# Patient Record
Sex: Female | Born: 1960
Health system: Southern US, Community
[De-identification: ages and names within clinical notes are randomized; demographics above are authoritative.]

## PROBLEM LIST (undated history)

## (undated) DIAGNOSIS — R600 Localized edema: Secondary | ICD-10-CM

## (undated) DIAGNOSIS — F4389 Other reactions to severe stress: Secondary | ICD-10-CM

## (undated) DIAGNOSIS — K760 Fatty (change of) liver, not elsewhere classified: Secondary | ICD-10-CM

## (undated) DIAGNOSIS — R5382 Chronic fatigue, unspecified: Secondary | ICD-10-CM

## (undated) DIAGNOSIS — A048 Other specified bacterial intestinal infections: Secondary | ICD-10-CM

## (undated) DIAGNOSIS — E0590 Thyrotoxicosis, unspecified without thyrotoxic crisis or storm: Secondary | ICD-10-CM

## (undated) DIAGNOSIS — R609 Edema, unspecified: Secondary | ICD-10-CM

## (undated) DIAGNOSIS — F438 Other reactions to severe stress: Secondary | ICD-10-CM

## (undated) DIAGNOSIS — M6740 Ganglion, unspecified site: Secondary | ICD-10-CM

## (undated) DIAGNOSIS — I10 Essential (primary) hypertension: Secondary | ICD-10-CM

## (undated) DIAGNOSIS — E1165 Type 2 diabetes mellitus with hyperglycemia: Secondary | ICD-10-CM

## (undated) DIAGNOSIS — J189 Pneumonia, unspecified organism: Secondary | ICD-10-CM

## (undated) DIAGNOSIS — M791 Myalgia, unspecified site: Secondary | ICD-10-CM

## (undated) DIAGNOSIS — M79609 Pain in unspecified limb: Secondary | ICD-10-CM

## (undated) DIAGNOSIS — I499 Cardiac arrhythmia, unspecified: Secondary | ICD-10-CM

## (undated) DIAGNOSIS — M66369 Spontaneous rupture of flexor tendons, unspecified lower leg: Secondary | ICD-10-CM

## (undated) DIAGNOSIS — E538 Deficiency of other specified B group vitamins: Secondary | ICD-10-CM

## (undated) DIAGNOSIS — K219 Gastro-esophageal reflux disease without esophagitis: Secondary | ICD-10-CM

## (undated) DIAGNOSIS — E079 Disorder of thyroid, unspecified: Secondary | ICD-10-CM

## (undated) DIAGNOSIS — R519 Headache, unspecified: Secondary | ICD-10-CM

## (undated) DIAGNOSIS — M549 Dorsalgia, unspecified: Secondary | ICD-10-CM

## (undated) DIAGNOSIS — K589 Irritable bowel syndrome without diarrhea: Secondary | ICD-10-CM

## (undated) DIAGNOSIS — A0472 Enterocolitis due to Clostridium difficile, not specified as recurrent: Secondary | ICD-10-CM

## (undated) DIAGNOSIS — R002 Palpitations: Secondary | ICD-10-CM

## (undated) DIAGNOSIS — M25519 Pain in unspecified shoulder: Secondary | ICD-10-CM

## (undated) DIAGNOSIS — Z889 Allergy status to unspecified drugs, medicaments and biological substances status: Secondary | ICD-10-CM

## (undated) DIAGNOSIS — Z9889 Other specified postprocedural states: Secondary | ICD-10-CM

## (undated) DIAGNOSIS — Z113 Encounter for screening for infections with a predominantly sexual mode of transmission: Secondary | ICD-10-CM

## (undated) DIAGNOSIS — M25529 Pain in unspecified elbow: Secondary | ICD-10-CM

## (undated) DIAGNOSIS — F419 Anxiety disorder, unspecified: Secondary | ICD-10-CM

## (undated) DIAGNOSIS — E785 Hyperlipidemia, unspecified: Secondary | ICD-10-CM

## (undated) DIAGNOSIS — T7840XA Allergy, unspecified, initial encounter: Secondary | ICD-10-CM

## (undated) DIAGNOSIS — B029 Zoster without complications: Secondary | ICD-10-CM

## (undated) DIAGNOSIS — R079 Chest pain, unspecified: Secondary | ICD-10-CM

## (undated) DIAGNOSIS — R0600 Dyspnea, unspecified: Secondary | ICD-10-CM

## (undated) DIAGNOSIS — M797 Fibromyalgia: Secondary | ICD-10-CM

## (undated) DIAGNOSIS — H269 Unspecified cataract: Secondary | ICD-10-CM

## (undated) DIAGNOSIS — R030 Elevated blood-pressure reading, without diagnosis of hypertension: Secondary | ICD-10-CM

## (undated) DIAGNOSIS — R112 Nausea with vomiting, unspecified: Secondary | ICD-10-CM

## (undated) DIAGNOSIS — K5792 Diverticulitis of intestine, part unspecified, without perforation or abscess without bleeding: Secondary | ICD-10-CM

## (undated) DIAGNOSIS — G9332 Myalgic encephalomyelitis/chronic fatigue syndrome: Secondary | ICD-10-CM

## (undated) DIAGNOSIS — Z9049 Acquired absence of other specified parts of digestive tract: Secondary | ICD-10-CM

## (undated) DIAGNOSIS — Z8719 Personal history of other diseases of the digestive system: Secondary | ICD-10-CM

## (undated) DIAGNOSIS — R51 Headache: Secondary | ICD-10-CM

## (undated) HISTORY — DX: Encounter for screening for infections with a predominantly sexual mode of transmission: Z11.3

## (undated) HISTORY — DX: Zoster without complications: B02.9

## (undated) HISTORY — DX: Disorder of thyroid, unspecified: E07.9

## (undated) HISTORY — DX: Type 2 diabetes mellitus with hyperglycemia: E11.65

## (undated) HISTORY — DX: Other reactions to severe stress: F43.89

## (undated) HISTORY — DX: Allergy, unspecified, initial encounter: T78.40XA

## (undated) HISTORY — DX: Enterocolitis due to Clostridium difficile, not specified as recurrent: A04.72

## (undated) HISTORY — DX: Chest pain, unspecified: R07.9

## (undated) HISTORY — DX: Edema, unspecified: R60.9

## (undated) HISTORY — DX: Gastro-esophageal reflux disease without esophagitis: K21.9

## (undated) HISTORY — PX: BUNIONECTOMY: SHX129

## (undated) HISTORY — DX: Pain in unspecified shoulder: M25.519

## (undated) HISTORY — DX: Personal history of other diseases of the digestive system: Z87.19

## (undated) HISTORY — DX: Other specified bacterial intestinal infections: A04.8

## (undated) HISTORY — DX: Allergy status to unspecified drugs, medicaments and biological substances status: Z88.9

## (undated) HISTORY — DX: Unspecified cataract: H26.9

## (undated) HISTORY — PX: COLONOSCOPY: SHX174

## (undated) HISTORY — DX: Localized edema: R60.0

## (undated) HISTORY — DX: Hyperlipidemia, unspecified: E78.5

## (undated) HISTORY — DX: Pain in unspecified elbow: M25.529

## (undated) HISTORY — DX: Chronic fatigue, unspecified: R53.82

## (undated) HISTORY — DX: Dorsalgia, unspecified: M54.9

## (undated) HISTORY — DX: Elevated blood-pressure reading, without diagnosis of hypertension: R03.0

## (undated) HISTORY — DX: Spontaneous rupture of flexor tendons, unspecified lower leg: M66.369

## (undated) HISTORY — PX: POLYPECTOMY: SHX149

## (undated) HISTORY — DX: Diverticulitis of intestine, part unspecified, without perforation or abscess without bleeding: K57.92

## (undated) HISTORY — DX: Dyspnea, unspecified: R06.00

## (undated) HISTORY — DX: Deficiency of other specified B group vitamins: E53.8

## (undated) HISTORY — DX: Ganglion, unspecified site: M67.40

## (undated) HISTORY — DX: Pain in unspecified limb: M79.609

## (undated) HISTORY — DX: Myalgia, unspecified site: M79.10

## (undated) HISTORY — DX: Irritable bowel syndrome, unspecified: K58.9

## (undated) HISTORY — DX: Myalgic encephalomyelitis/chronic fatigue syndrome: G93.32

## (undated) HISTORY — DX: Other reactions to severe stress: F43.8

## (undated) HISTORY — DX: Fatty (change of) liver, not elsewhere classified: K76.0

## (undated) HISTORY — DX: Essential (primary) hypertension: I10

## (undated) HISTORY — PX: TUBAL LIGATION: SHX77

## (undated) HISTORY — DX: Palpitations: R00.2

## (undated) HISTORY — PX: GANGLION CYST EXCISION: SHX1691

## (undated) HISTORY — DX: Fibromyalgia: M79.7

## (undated) HISTORY — DX: Acquired absence of other specified parts of digestive tract: Z90.49

## (undated) HISTORY — PX: ABDOMINAL HYSTERECTOMY: SHX81

## (undated) HISTORY — PX: OTHER SURGICAL HISTORY: SHX169

---

## 1998-10-05 ENCOUNTER — Ambulatory Visit (HOSPITAL_COMMUNITY): Admission: RE | Admit: 1998-10-05 | Discharge: 1998-10-05 | Payer: Self-pay | Admitting: Obstetrics and Gynecology

## 1998-12-29 ENCOUNTER — Inpatient Hospital Stay (HOSPITAL_COMMUNITY): Admission: RE | Admit: 1998-12-29 | Discharge: 1999-01-01 | Payer: Self-pay | Admitting: Obstetrics and Gynecology

## 1999-04-18 ENCOUNTER — Emergency Department (HOSPITAL_COMMUNITY): Admission: EM | Admit: 1999-04-18 | Discharge: 1999-04-18 | Payer: Self-pay | Admitting: Emergency Medicine

## 2001-04-18 ENCOUNTER — Encounter: Payer: Self-pay | Admitting: Emergency Medicine

## 2001-04-18 ENCOUNTER — Emergency Department (HOSPITAL_COMMUNITY): Admission: EM | Admit: 2001-04-18 | Discharge: 2001-04-18 | Payer: Self-pay | Admitting: Emergency Medicine

## 2001-10-31 ENCOUNTER — Encounter: Admission: RE | Admit: 2001-10-31 | Discharge: 2001-10-31 | Payer: Self-pay | Admitting: Internal Medicine

## 2001-10-31 ENCOUNTER — Encounter: Payer: Self-pay | Admitting: Internal Medicine

## 2002-01-20 ENCOUNTER — Encounter: Admission: RE | Admit: 2002-01-20 | Discharge: 2002-01-20 | Payer: Self-pay | Admitting: Gastroenterology

## 2002-01-20 ENCOUNTER — Encounter: Payer: Self-pay | Admitting: Gastroenterology

## 2002-02-02 ENCOUNTER — Encounter: Payer: Self-pay | Admitting: Internal Medicine

## 2002-02-02 ENCOUNTER — Encounter: Admission: RE | Admit: 2002-02-02 | Discharge: 2002-02-02 | Payer: Self-pay | Admitting: Internal Medicine

## 2002-10-29 ENCOUNTER — Encounter: Admission: RE | Admit: 2002-10-29 | Discharge: 2002-10-29 | Payer: Self-pay | Admitting: Internal Medicine

## 2002-10-29 ENCOUNTER — Encounter: Payer: Self-pay | Admitting: Internal Medicine

## 2002-12-30 ENCOUNTER — Encounter: Payer: Self-pay | Admitting: Rheumatology

## 2002-12-30 ENCOUNTER — Encounter: Admission: RE | Admit: 2002-12-30 | Discharge: 2002-12-30 | Payer: Self-pay | Admitting: Rheumatology

## 2003-01-12 ENCOUNTER — Encounter: Payer: Self-pay | Admitting: Emergency Medicine

## 2003-01-12 ENCOUNTER — Emergency Department (HOSPITAL_COMMUNITY): Admission: EM | Admit: 2003-01-12 | Discharge: 2003-01-13 | Payer: Self-pay | Admitting: Emergency Medicine

## 2003-01-14 ENCOUNTER — Encounter: Payer: Self-pay | Admitting: Orthopedic Surgery

## 2003-01-15 ENCOUNTER — Ambulatory Visit (HOSPITAL_COMMUNITY): Admission: RE | Admit: 2003-01-15 | Discharge: 2003-01-15 | Payer: Self-pay | Admitting: Orthopedic Surgery

## 2003-02-09 ENCOUNTER — Encounter: Admission: RE | Admit: 2003-02-09 | Discharge: 2003-05-10 | Payer: Self-pay | Admitting: Orthopedic Surgery

## 2003-02-11 ENCOUNTER — Ambulatory Visit (HOSPITAL_COMMUNITY): Admission: RE | Admit: 2003-02-11 | Discharge: 2003-02-11 | Payer: Self-pay | Admitting: *Deleted

## 2003-08-21 HISTORY — PX: ACHILLES TENDON REPAIR: SUR1153

## 2004-09-27 ENCOUNTER — Emergency Department (HOSPITAL_COMMUNITY): Admission: EM | Admit: 2004-09-27 | Discharge: 2004-09-27 | Payer: Self-pay | Admitting: Emergency Medicine

## 2004-10-09 ENCOUNTER — Ambulatory Visit: Payer: Self-pay | Admitting: Internal Medicine

## 2004-10-30 ENCOUNTER — Ambulatory Visit: Payer: Self-pay | Admitting: Internal Medicine

## 2004-11-15 ENCOUNTER — Ambulatory Visit: Payer: Self-pay | Admitting: Family Medicine

## 2004-11-15 ENCOUNTER — Other Ambulatory Visit: Admission: RE | Admit: 2004-11-15 | Discharge: 2004-11-15 | Payer: Self-pay | Admitting: Family Medicine

## 2004-12-04 ENCOUNTER — Encounter: Admission: RE | Admit: 2004-12-04 | Discharge: 2004-12-04 | Payer: Self-pay | Admitting: Family Medicine

## 2004-12-07 ENCOUNTER — Ambulatory Visit: Payer: Self-pay | Admitting: Internal Medicine

## 2005-01-01 ENCOUNTER — Ambulatory Visit: Payer: Self-pay | Admitting: Family Medicine

## 2005-01-05 ENCOUNTER — Ambulatory Visit: Payer: Self-pay | Admitting: Internal Medicine

## 2005-04-26 ENCOUNTER — Ambulatory Visit: Payer: Self-pay | Admitting: Family Medicine

## 2005-04-27 ENCOUNTER — Ambulatory Visit: Payer: Self-pay | Admitting: Cardiology

## 2005-04-27 ENCOUNTER — Ambulatory Visit: Payer: Self-pay | Admitting: Family Medicine

## 2005-06-05 ENCOUNTER — Encounter: Admission: RE | Admit: 2005-06-05 | Discharge: 2005-06-05 | Payer: Self-pay | Admitting: Neurology

## 2005-08-15 ENCOUNTER — Ambulatory Visit: Payer: Self-pay | Admitting: Internal Medicine

## 2005-11-15 ENCOUNTER — Ambulatory Visit: Payer: Self-pay | Admitting: Family Medicine

## 2006-01-08 ENCOUNTER — Encounter: Admission: RE | Admit: 2006-01-08 | Discharge: 2006-01-08 | Payer: Self-pay | Admitting: Family Medicine

## 2006-01-30 ENCOUNTER — Encounter: Admission: RE | Admit: 2006-01-30 | Discharge: 2006-01-30 | Payer: Self-pay | Admitting: Family Medicine

## 2006-04-30 ENCOUNTER — Ambulatory Visit: Payer: Self-pay | Admitting: Family Medicine

## 2006-05-01 ENCOUNTER — Ambulatory Visit: Payer: Self-pay | Admitting: Family Medicine

## 2006-05-15 ENCOUNTER — Ambulatory Visit: Payer: Self-pay | Admitting: Family Medicine

## 2006-05-23 ENCOUNTER — Ambulatory Visit: Payer: Self-pay | Admitting: Family Medicine

## 2007-03-18 ENCOUNTER — Other Ambulatory Visit: Admission: RE | Admit: 2007-03-18 | Discharge: 2007-03-18 | Payer: Self-pay | Admitting: Family Medicine

## 2007-03-18 ENCOUNTER — Encounter: Payer: Self-pay | Admitting: Family Medicine

## 2007-03-18 ENCOUNTER — Ambulatory Visit: Payer: Self-pay | Admitting: Family Medicine

## 2007-03-18 DIAGNOSIS — Z9189 Other specified personal risk factors, not elsewhere classified: Secondary | ICD-10-CM | POA: Insufficient documentation

## 2007-03-18 DIAGNOSIS — M6740 Ganglion, unspecified site: Secondary | ICD-10-CM | POA: Insufficient documentation

## 2007-03-18 DIAGNOSIS — J45909 Unspecified asthma, uncomplicated: Secondary | ICD-10-CM | POA: Insufficient documentation

## 2007-03-18 DIAGNOSIS — M66369 Spontaneous rupture of flexor tendons, unspecified lower leg: Secondary | ICD-10-CM

## 2007-03-21 ENCOUNTER — Encounter (INDEPENDENT_AMBULATORY_CARE_PROVIDER_SITE_OTHER): Payer: Self-pay | Admitting: *Deleted

## 2007-04-01 ENCOUNTER — Ambulatory Visit: Payer: Self-pay | Admitting: Family Medicine

## 2007-04-01 ENCOUNTER — Encounter (INDEPENDENT_AMBULATORY_CARE_PROVIDER_SITE_OTHER): Payer: Self-pay | Admitting: *Deleted

## 2007-04-02 LAB — CONVERTED CEMR LAB: Pap Smear: NORMAL

## 2007-04-07 ENCOUNTER — Telehealth (INDEPENDENT_AMBULATORY_CARE_PROVIDER_SITE_OTHER): Payer: Self-pay | Admitting: *Deleted

## 2007-04-08 ENCOUNTER — Ambulatory Visit: Payer: Self-pay | Admitting: Family Medicine

## 2007-04-08 DIAGNOSIS — B029 Zoster without complications: Secondary | ICD-10-CM | POA: Insufficient documentation

## 2007-04-09 ENCOUNTER — Encounter: Payer: Self-pay | Admitting: Family Medicine

## 2007-04-09 ENCOUNTER — Encounter: Admission: RE | Admit: 2007-04-09 | Discharge: 2007-04-09 | Payer: Self-pay | Admitting: Family Medicine

## 2007-04-14 ENCOUNTER — Ambulatory Visit: Payer: Self-pay | Admitting: Family Medicine

## 2007-04-14 ENCOUNTER — Ambulatory Visit: Payer: Self-pay

## 2007-04-14 ENCOUNTER — Encounter (INDEPENDENT_AMBULATORY_CARE_PROVIDER_SITE_OTHER): Payer: Self-pay | Admitting: *Deleted

## 2007-04-14 DIAGNOSIS — M79609 Pain in unspecified limb: Secondary | ICD-10-CM | POA: Insufficient documentation

## 2007-04-14 DIAGNOSIS — R609 Edema, unspecified: Secondary | ICD-10-CM | POA: Insufficient documentation

## 2007-04-15 ENCOUNTER — Telehealth (INDEPENDENT_AMBULATORY_CARE_PROVIDER_SITE_OTHER): Payer: Self-pay | Admitting: *Deleted

## 2007-04-29 ENCOUNTER — Ambulatory Visit: Payer: Self-pay | Admitting: Family Medicine

## 2007-04-29 DIAGNOSIS — R030 Elevated blood-pressure reading, without diagnosis of hypertension: Secondary | ICD-10-CM

## 2007-05-13 ENCOUNTER — Ambulatory Visit: Payer: Self-pay | Admitting: Family Medicine

## 2007-05-20 ENCOUNTER — Encounter (INDEPENDENT_AMBULATORY_CARE_PROVIDER_SITE_OTHER): Payer: Self-pay | Admitting: *Deleted

## 2007-05-20 LAB — CONVERTED CEMR LAB
BUN: 16 mg/dL (ref 6–23)
Creatinine, Ser: 1 mg/dL (ref 0.4–1.2)
GFR calc Af Amer: 76 mL/min
Hgb A1c MFr Bld: 6.1 % — ABNORMAL HIGH (ref 4.6–6.0)
Potassium: 3.8 meq/L (ref 3.5–5.1)
Sodium: 141 meq/L (ref 135–145)

## 2007-10-01 ENCOUNTER — Ambulatory Visit: Payer: Self-pay | Admitting: Family Medicine

## 2007-10-01 DIAGNOSIS — M25519 Pain in unspecified shoulder: Secondary | ICD-10-CM

## 2007-10-01 DIAGNOSIS — M25529 Pain in unspecified elbow: Secondary | ICD-10-CM

## 2007-10-02 ENCOUNTER — Telehealth: Payer: Self-pay | Admitting: Family Medicine

## 2007-11-17 ENCOUNTER — Ambulatory Visit: Payer: Self-pay | Admitting: Family Medicine

## 2007-11-26 LAB — CONVERTED CEMR LAB
AST: 23 units/L (ref 0–37)
Alkaline Phosphatase: 61 units/L (ref 39–117)
Bilirubin, Direct: 0.1 mg/dL (ref 0.0–0.3)
Chloride: 108 meq/L (ref 96–112)
Cholesterol: 167 mg/dL (ref 0–200)
GFR calc Af Amer: 99 mL/min
GFR calc non Af Amer: 82 mL/min
LDL Cholesterol: 108 mg/dL — ABNORMAL HIGH (ref 0–99)
Potassium: 3.7 meq/L (ref 3.5–5.1)
Sodium: 143 meq/L (ref 135–145)
VLDL: 14 mg/dL (ref 0–40)

## 2007-11-27 ENCOUNTER — Encounter (INDEPENDENT_AMBULATORY_CARE_PROVIDER_SITE_OTHER): Payer: Self-pay | Admitting: *Deleted

## 2008-03-16 ENCOUNTER — Ambulatory Visit: Payer: Self-pay | Admitting: Family Medicine

## 2008-03-16 DIAGNOSIS — F438 Other reactions to severe stress: Secondary | ICD-10-CM

## 2008-03-16 DIAGNOSIS — F4389 Other reactions to severe stress: Secondary | ICD-10-CM | POA: Insufficient documentation

## 2008-03-16 DIAGNOSIS — R1013 Epigastric pain: Secondary | ICD-10-CM

## 2008-03-18 ENCOUNTER — Telehealth (INDEPENDENT_AMBULATORY_CARE_PROVIDER_SITE_OTHER): Payer: Self-pay | Admitting: *Deleted

## 2008-03-18 LAB — CONVERTED CEMR LAB
ALT: 31 units/L (ref 0–35)
Basophils Absolute: 0 10*3/uL (ref 0.0–0.1)
Basophils Relative: 0.3 % (ref 0.0–3.0)
CO2: 30 meq/L (ref 19–32)
Calcium: 9.5 mg/dL (ref 8.4–10.5)
Creatinine, Ser: 1 mg/dL (ref 0.4–1.2)
Eosinophils Absolute: 0.2 10*3/uL (ref 0.0–0.7)
GFR calc Af Amer: 76 mL/min
GFR calc non Af Amer: 63 mL/min
H Pylori IgG: POSITIVE — AB
HCT: 40.4 % (ref 36.0–46.0)
Hemoglobin: 13.5 g/dL (ref 12.0–15.0)
MCHC: 33.5 g/dL (ref 30.0–36.0)
MCV: 87.3 fL (ref 78.0–100.0)
Monocytes Absolute: 0.6 10*3/uL (ref 0.1–1.0)
Neutro Abs: 2.1 10*3/uL (ref 1.4–7.7)
RBC: 4.62 M/uL (ref 3.87–5.11)
RDW: 14.6 % (ref 11.5–14.6)
Total Bilirubin: 0.7 mg/dL (ref 0.3–1.2)

## 2008-03-19 ENCOUNTER — Telehealth (INDEPENDENT_AMBULATORY_CARE_PROVIDER_SITE_OTHER): Payer: Self-pay | Admitting: *Deleted

## 2008-04-05 ENCOUNTER — Telehealth: Payer: Self-pay | Admitting: Family Medicine

## 2008-04-05 ENCOUNTER — Encounter (INDEPENDENT_AMBULATORY_CARE_PROVIDER_SITE_OTHER): Payer: Self-pay | Admitting: *Deleted

## 2008-04-05 ENCOUNTER — Telehealth (INDEPENDENT_AMBULATORY_CARE_PROVIDER_SITE_OTHER): Payer: Self-pay | Admitting: *Deleted

## 2008-04-05 DIAGNOSIS — Z8719 Personal history of other diseases of the digestive system: Secondary | ICD-10-CM

## 2008-04-08 ENCOUNTER — Ambulatory Visit: Payer: Self-pay | Admitting: Gastroenterology

## 2008-04-28 ENCOUNTER — Encounter: Payer: Self-pay | Admitting: Gastroenterology

## 2008-04-28 ENCOUNTER — Ambulatory Visit: Payer: Self-pay | Admitting: Gastroenterology

## 2008-04-29 ENCOUNTER — Telehealth (INDEPENDENT_AMBULATORY_CARE_PROVIDER_SITE_OTHER): Payer: Self-pay | Admitting: *Deleted

## 2008-04-30 ENCOUNTER — Telehealth (INDEPENDENT_AMBULATORY_CARE_PROVIDER_SITE_OTHER): Payer: Self-pay | Admitting: *Deleted

## 2008-05-03 ENCOUNTER — Telehealth (INDEPENDENT_AMBULATORY_CARE_PROVIDER_SITE_OTHER): Payer: Self-pay | Admitting: *Deleted

## 2008-05-04 ENCOUNTER — Encounter: Payer: Self-pay | Admitting: Family Medicine

## 2008-05-06 ENCOUNTER — Encounter: Payer: Self-pay | Admitting: Gastroenterology

## 2008-05-07 ENCOUNTER — Telehealth (INDEPENDENT_AMBULATORY_CARE_PROVIDER_SITE_OTHER): Payer: Self-pay | Admitting: *Deleted

## 2008-05-10 ENCOUNTER — Encounter: Payer: Self-pay | Admitting: Gastroenterology

## 2008-05-13 ENCOUNTER — Telehealth (INDEPENDENT_AMBULATORY_CARE_PROVIDER_SITE_OTHER): Payer: Self-pay | Admitting: *Deleted

## 2008-05-25 ENCOUNTER — Ambulatory Visit: Payer: Self-pay | Admitting: Gastroenterology

## 2008-05-28 ENCOUNTER — Telehealth (INDEPENDENT_AMBULATORY_CARE_PROVIDER_SITE_OTHER): Payer: Self-pay | Admitting: *Deleted

## 2008-06-07 ENCOUNTER — Encounter: Payer: Self-pay | Admitting: Family Medicine

## 2008-06-08 ENCOUNTER — Ambulatory Visit: Payer: Self-pay | Admitting: Family Medicine

## 2008-06-08 DIAGNOSIS — K219 Gastro-esophageal reflux disease without esophagitis: Secondary | ICD-10-CM

## 2008-06-08 LAB — CONVERTED CEMR LAB
Bilirubin Urine: NEGATIVE
Blood in Urine, dipstick: NEGATIVE
Glucose, Urine, Semiquant: NEGATIVE
Specific Gravity, Urine: 1.025
WBC Urine, dipstick: NEGATIVE
pH: 6.5

## 2008-06-09 ENCOUNTER — Telehealth: Payer: Self-pay | Admitting: Gastroenterology

## 2008-06-15 ENCOUNTER — Encounter (INDEPENDENT_AMBULATORY_CARE_PROVIDER_SITE_OTHER): Payer: Self-pay | Admitting: *Deleted

## 2008-06-15 LAB — CONVERTED CEMR LAB
Albumin: 4 g/dL (ref 3.5–5.2)
BUN: 11 mg/dL (ref 6–23)
Basophils Relative: 0.4 % (ref 0.0–3.0)
Calcium: 9.7 mg/dL (ref 8.4–10.5)
Creatinine, Ser: 0.9 mg/dL (ref 0.4–1.2)
Eosinophils Absolute: 0.2 10*3/uL (ref 0.0–0.7)
Eosinophils Relative: 3.4 % (ref 0.0–5.0)
GFR calc Af Amer: 86 mL/min
GFR calc non Af Amer: 71 mL/min
HCT: 38.9 % (ref 36.0–46.0)
Hgb A1c MFr Bld: 5.9 % (ref 4.6–6.0)
MCV: 87.5 fL (ref 78.0–100.0)
Monocytes Absolute: 0.4 10*3/uL (ref 0.1–1.0)
Platelets: 217 10*3/uL (ref 150–400)
RBC: 4.45 M/uL (ref 3.87–5.11)
TSH: 1 microintl units/mL (ref 0.35–5.50)
Triglycerides: 88 mg/dL (ref 0–149)
WBC: 4.8 10*3/uL (ref 4.5–10.5)

## 2008-06-16 ENCOUNTER — Telehealth (INDEPENDENT_AMBULATORY_CARE_PROVIDER_SITE_OTHER): Payer: Self-pay | Admitting: *Deleted

## 2008-06-30 ENCOUNTER — Encounter: Admission: RE | Admit: 2008-06-30 | Discharge: 2008-06-30 | Payer: Self-pay | Admitting: Family Medicine

## 2008-07-09 ENCOUNTER — Encounter: Payer: Self-pay | Admitting: Family Medicine

## 2008-07-12 ENCOUNTER — Telehealth (INDEPENDENT_AMBULATORY_CARE_PROVIDER_SITE_OTHER): Payer: Self-pay | Admitting: *Deleted

## 2008-07-13 ENCOUNTER — Encounter: Admission: RE | Admit: 2008-07-13 | Discharge: 2008-07-13 | Payer: Self-pay | Admitting: Family Medicine

## 2008-07-14 ENCOUNTER — Encounter (INDEPENDENT_AMBULATORY_CARE_PROVIDER_SITE_OTHER): Payer: Self-pay | Admitting: *Deleted

## 2008-07-19 ENCOUNTER — Ambulatory Visit: Payer: Self-pay | Admitting: Cardiology

## 2008-07-19 ENCOUNTER — Observation Stay (HOSPITAL_COMMUNITY): Admission: EM | Admit: 2008-07-19 | Discharge: 2008-07-20 | Payer: Self-pay | Admitting: Internal Medicine

## 2008-07-20 ENCOUNTER — Encounter: Payer: Self-pay | Admitting: Internal Medicine

## 2008-07-27 ENCOUNTER — Ambulatory Visit: Payer: Self-pay | Admitting: Family Medicine

## 2008-07-27 DIAGNOSIS — R072 Precordial pain: Secondary | ICD-10-CM | POA: Insufficient documentation

## 2008-07-29 ENCOUNTER — Encounter (INDEPENDENT_AMBULATORY_CARE_PROVIDER_SITE_OTHER): Payer: Self-pay | Admitting: *Deleted

## 2008-08-17 ENCOUNTER — Ambulatory Visit: Payer: Self-pay | Admitting: Cardiology

## 2008-09-07 ENCOUNTER — Telehealth: Payer: Self-pay | Admitting: Family Medicine

## 2008-09-14 ENCOUNTER — Ambulatory Visit: Payer: Self-pay | Admitting: Cardiology

## 2008-09-14 ENCOUNTER — Encounter: Payer: Self-pay | Admitting: Cardiology

## 2008-09-15 ENCOUNTER — Telehealth: Payer: Self-pay | Admitting: Family Medicine

## 2008-09-16 ENCOUNTER — Ambulatory Visit: Payer: Self-pay | Admitting: Family Medicine

## 2008-09-16 DIAGNOSIS — IMO0001 Reserved for inherently not codable concepts without codable children: Secondary | ICD-10-CM | POA: Insufficient documentation

## 2008-09-16 DIAGNOSIS — E785 Hyperlipidemia, unspecified: Secondary | ICD-10-CM

## 2008-09-16 LAB — CONVERTED CEMR LAB
CO2: 27 meq/L (ref 19–32)
Calcium: 9.5 mg/dL (ref 8.4–10.5)
Eosinophils Absolute: 0.2 10*3/uL (ref 0.0–0.7)
Eosinophils Relative: 3.4 % (ref 0.0–5.0)
Folate: 16.2 ng/mL
GFR calc non Af Amer: 81 mL/min
MCV: 86.8 fL (ref 78.0–100.0)
Monocytes Relative: 9.7 % (ref 3.0–12.0)
Neutrophils Relative %: 41.9 % — ABNORMAL LOW (ref 43.0–77.0)
Platelets: 235 10*3/uL (ref 150–400)
Sed Rate: 17 mm/hr (ref 0–22)
Sodium: 140 meq/L (ref 135–145)
Total CK: 177 units/L (ref 7–177)
WBC: 4.8 10*3/uL (ref 4.5–10.5)

## 2008-09-17 ENCOUNTER — Encounter (INDEPENDENT_AMBULATORY_CARE_PROVIDER_SITE_OTHER): Payer: Self-pay | Admitting: *Deleted

## 2008-09-21 ENCOUNTER — Telehealth: Payer: Self-pay | Admitting: Family Medicine

## 2008-09-22 ENCOUNTER — Telehealth (INDEPENDENT_AMBULATORY_CARE_PROVIDER_SITE_OTHER): Payer: Self-pay | Admitting: *Deleted

## 2008-09-22 LAB — CONVERTED CEMR LAB: Anti Nuclear Antibody(ANA): NEGATIVE

## 2008-09-23 ENCOUNTER — Telehealth (INDEPENDENT_AMBULATORY_CARE_PROVIDER_SITE_OTHER): Payer: Self-pay | Admitting: *Deleted

## 2008-09-23 ENCOUNTER — Ambulatory Visit: Payer: Self-pay | Admitting: Family Medicine

## 2008-09-23 LAB — CONVERTED CEMR LAB
Blood in Urine, dipstick: NEGATIVE
Ketones, urine, test strip: NEGATIVE
Nitrite: NEGATIVE
Protein, U semiquant: NEGATIVE
Urobilinogen, UA: 0.2

## 2008-09-24 ENCOUNTER — Encounter: Payer: Self-pay | Admitting: Family Medicine

## 2008-09-27 ENCOUNTER — Encounter (INDEPENDENT_AMBULATORY_CARE_PROVIDER_SITE_OTHER): Payer: Self-pay | Admitting: *Deleted

## 2008-09-27 ENCOUNTER — Telehealth (INDEPENDENT_AMBULATORY_CARE_PROVIDER_SITE_OTHER): Payer: Self-pay | Admitting: *Deleted

## 2008-09-29 ENCOUNTER — Encounter (INDEPENDENT_AMBULATORY_CARE_PROVIDER_SITE_OTHER): Payer: Self-pay | Admitting: *Deleted

## 2008-11-26 ENCOUNTER — Encounter: Payer: Self-pay | Admitting: Family Medicine

## 2008-11-26 ENCOUNTER — Telehealth (INDEPENDENT_AMBULATORY_CARE_PROVIDER_SITE_OTHER): Payer: Self-pay | Admitting: *Deleted

## 2008-12-22 ENCOUNTER — Encounter (INDEPENDENT_AMBULATORY_CARE_PROVIDER_SITE_OTHER): Payer: Self-pay | Admitting: *Deleted

## 2009-01-12 ENCOUNTER — Encounter (INDEPENDENT_AMBULATORY_CARE_PROVIDER_SITE_OTHER): Payer: Self-pay | Admitting: *Deleted

## 2009-05-02 ENCOUNTER — Telehealth: Payer: Self-pay | Admitting: Family Medicine

## 2009-05-04 ENCOUNTER — Ambulatory Visit: Payer: Self-pay | Admitting: Family Medicine

## 2009-05-04 DIAGNOSIS — M549 Dorsalgia, unspecified: Secondary | ICD-10-CM | POA: Insufficient documentation

## 2009-05-04 DIAGNOSIS — L03211 Cellulitis of face: Secondary | ICD-10-CM

## 2009-05-04 DIAGNOSIS — L0201 Cutaneous abscess of face: Secondary | ICD-10-CM | POA: Insufficient documentation

## 2009-05-04 LAB — CONVERTED CEMR LAB
Bilirubin Urine: NEGATIVE
Blood in Urine, dipstick: NEGATIVE
Ketones, urine, test strip: NEGATIVE
Protein, U semiquant: NEGATIVE
Urobilinogen, UA: NEGATIVE
pH: 7.5

## 2009-05-06 ENCOUNTER — Telehealth (INDEPENDENT_AMBULATORY_CARE_PROVIDER_SITE_OTHER): Payer: Self-pay | Admitting: *Deleted

## 2009-05-06 LAB — CONVERTED CEMR LAB
ALT: 43 units/L — ABNORMAL HIGH (ref 0–35)
AST: 36 units/L (ref 0–37)
Alkaline Phosphatase: 66 units/L (ref 39–117)
Anti Nuclear Antibody(ANA): NEGATIVE
Basophils Relative: 0.6 % (ref 0.0–3.0)
Chloride: 105 meq/L (ref 96–112)
Eosinophils Relative: 4.9 % (ref 0.0–5.0)
Folate: 19.7 ng/mL
GFR calc non Af Amer: 67.81 mL/min (ref >60)
HCT: 40.8 % (ref 36.0–46.0)
Lymphs Abs: 2.1 10*3/uL (ref 0.7–4.0)
MCV: 88.4 fL (ref 78.0–100.0)
Monocytes Absolute: 0.5 10*3/uL (ref 0.1–1.0)
Monocytes Relative: 10.7 % (ref 3.0–12.0)
Neutrophils Relative %: 39.8 % — ABNORMAL LOW (ref 43.0–77.0)
Potassium: 4.1 meq/L (ref 3.5–5.1)
RBC: 4.62 M/uL (ref 3.87–5.11)
Rhuematoid fact SerPl-aCnc: <20 intl units/mL (ref 0.0–20.0)
Sodium: 143 meq/L (ref 135–145)
TSH: 0.79 microintl units/mL (ref 0.35–5.50)
Total Bilirubin: 0.9 mg/dL (ref 0.3–1.2)
Vitamin B-12: 384 pg/mL (ref 211–911)
WBC: 4.6 10*3/uL (ref 4.5–10.5)

## 2009-06-13 ENCOUNTER — Encounter: Payer: Self-pay | Admitting: Family Medicine

## 2009-06-13 ENCOUNTER — Telehealth: Payer: Self-pay | Admitting: Family Medicine

## 2009-06-15 ENCOUNTER — Telehealth: Payer: Self-pay | Admitting: Family Medicine

## 2009-07-06 ENCOUNTER — Encounter: Admission: RE | Admit: 2009-07-06 | Discharge: 2009-07-06 | Payer: Self-pay | Admitting: Family Medicine

## 2009-07-06 ENCOUNTER — Telehealth: Payer: Self-pay | Admitting: Family Medicine

## 2009-07-06 ENCOUNTER — Ambulatory Visit: Payer: Self-pay | Admitting: Family Medicine

## 2009-07-06 DIAGNOSIS — Z872 Personal history of diseases of the skin and subcutaneous tissue: Secondary | ICD-10-CM | POA: Insufficient documentation

## 2009-07-06 DIAGNOSIS — R1011 Right upper quadrant pain: Secondary | ICD-10-CM

## 2009-07-06 DIAGNOSIS — R109 Unspecified abdominal pain: Secondary | ICD-10-CM

## 2009-07-06 DIAGNOSIS — R197 Diarrhea, unspecified: Secondary | ICD-10-CM | POA: Insufficient documentation

## 2009-07-08 ENCOUNTER — Encounter: Payer: Self-pay | Admitting: Family Medicine

## 2009-07-12 ENCOUNTER — Telehealth (INDEPENDENT_AMBULATORY_CARE_PROVIDER_SITE_OTHER): Payer: Self-pay | Admitting: *Deleted

## 2009-07-12 ENCOUNTER — Encounter: Payer: Self-pay | Admitting: Family Medicine

## 2009-07-12 LAB — CONVERTED CEMR LAB: Tissue Transglutaminase Ab, IgA: 0.5 units (ref <7)

## 2009-07-20 ENCOUNTER — Ambulatory Visit: Payer: Self-pay | Admitting: Family Medicine

## 2009-07-20 LAB — CONVERTED CEMR LAB
OCCULT 1: NEGATIVE
OCCULT 2: NEGATIVE

## 2009-07-21 ENCOUNTER — Encounter (INDEPENDENT_AMBULATORY_CARE_PROVIDER_SITE_OTHER): Payer: Self-pay | Admitting: *Deleted

## 2009-07-22 ENCOUNTER — Encounter (INDEPENDENT_AMBULATORY_CARE_PROVIDER_SITE_OTHER): Payer: Self-pay | Admitting: *Deleted

## 2009-07-22 ENCOUNTER — Ambulatory Visit: Payer: Self-pay | Admitting: Family Medicine

## 2009-07-22 ENCOUNTER — Other Ambulatory Visit: Admission: RE | Admit: 2009-07-22 | Discharge: 2009-07-22 | Payer: Self-pay | Admitting: Family Medicine

## 2009-07-28 ENCOUNTER — Telehealth: Payer: Self-pay | Admitting: Gastroenterology

## 2009-07-29 ENCOUNTER — Encounter (INDEPENDENT_AMBULATORY_CARE_PROVIDER_SITE_OTHER): Payer: Self-pay | Admitting: *Deleted

## 2009-08-03 ENCOUNTER — Ambulatory Visit: Payer: Self-pay | Admitting: Internal Medicine

## 2009-08-03 DIAGNOSIS — R142 Eructation: Secondary | ICD-10-CM

## 2009-08-03 DIAGNOSIS — R143 Flatulence: Secondary | ICD-10-CM

## 2009-08-03 DIAGNOSIS — R110 Nausea: Secondary | ICD-10-CM

## 2009-08-03 DIAGNOSIS — R141 Gas pain: Secondary | ICD-10-CM

## 2009-08-04 ENCOUNTER — Encounter: Payer: Self-pay | Admitting: Nurse Practitioner

## 2009-08-31 ENCOUNTER — Telehealth: Payer: Self-pay | Admitting: Gastroenterology

## 2009-09-01 ENCOUNTER — Ambulatory Visit: Payer: Self-pay | Admitting: Gastroenterology

## 2009-09-01 DIAGNOSIS — K3189 Other diseases of stomach and duodenum: Secondary | ICD-10-CM | POA: Insufficient documentation

## 2009-09-01 DIAGNOSIS — R1013 Epigastric pain: Secondary | ICD-10-CM

## 2009-09-05 ENCOUNTER — Telehealth: Payer: Self-pay | Admitting: Gastroenterology

## 2009-09-07 ENCOUNTER — Ambulatory Visit: Payer: Self-pay | Admitting: Gastroenterology

## 2009-09-08 ENCOUNTER — Ambulatory Visit (HOSPITAL_BASED_OUTPATIENT_CLINIC_OR_DEPARTMENT_OTHER): Admission: RE | Admit: 2009-09-08 | Discharge: 2009-09-08 | Payer: Self-pay | Admitting: Family Medicine

## 2009-09-09 ENCOUNTER — Ambulatory Visit: Payer: Self-pay | Admitting: Diagnostic Radiology

## 2009-09-15 ENCOUNTER — Encounter: Payer: Self-pay | Admitting: Gastroenterology

## 2009-09-16 ENCOUNTER — Telehealth: Payer: Self-pay | Admitting: Gastroenterology

## 2009-09-22 ENCOUNTER — Encounter: Payer: Self-pay | Admitting: Gastroenterology

## 2009-09-23 ENCOUNTER — Telehealth (INDEPENDENT_AMBULATORY_CARE_PROVIDER_SITE_OTHER): Payer: Self-pay | Admitting: *Deleted

## 2009-10-06 ENCOUNTER — Telehealth (INDEPENDENT_AMBULATORY_CARE_PROVIDER_SITE_OTHER): Payer: Self-pay | Admitting: *Deleted

## 2009-10-06 ENCOUNTER — Encounter: Payer: Self-pay | Admitting: Family Medicine

## 2009-10-06 ENCOUNTER — Ambulatory Visit (HOSPITAL_COMMUNITY): Admission: RE | Admit: 2009-10-06 | Discharge: 2009-10-06 | Payer: Self-pay | Admitting: Gastroenterology

## 2009-10-10 ENCOUNTER — Ambulatory Visit: Payer: Self-pay | Admitting: Diagnostic Radiology

## 2009-10-10 ENCOUNTER — Telehealth: Payer: Self-pay | Admitting: Family Medicine

## 2009-10-10 ENCOUNTER — Emergency Department (HOSPITAL_BASED_OUTPATIENT_CLINIC_OR_DEPARTMENT_OTHER): Admission: EM | Admit: 2009-10-10 | Discharge: 2009-10-11 | Payer: Self-pay | Admitting: Emergency Medicine

## 2009-10-10 ENCOUNTER — Encounter: Payer: Self-pay | Admitting: Family Medicine

## 2009-10-17 ENCOUNTER — Ambulatory Visit: Payer: Self-pay | Admitting: Gastroenterology

## 2009-10-20 ENCOUNTER — Ambulatory Visit: Payer: Self-pay | Admitting: Family Medicine

## 2009-10-31 LAB — CONVERTED CEMR LAB
Bilirubin, Direct: 0.2 mg/dL (ref 0.0–0.3)
LDL Cholesterol: 112 mg/dL — ABNORMAL HIGH (ref 0–99)
Total Bilirubin: 0.6 mg/dL (ref 0.3–1.2)
Total CHOL/HDL Ratio: 4
VLDL: 25.4 mg/dL (ref 0.0–40.0)

## 2009-11-21 ENCOUNTER — Telehealth: Payer: Self-pay | Admitting: Family Medicine

## 2009-11-22 ENCOUNTER — Ambulatory Visit: Payer: Self-pay | Admitting: Family Medicine

## 2009-11-22 DIAGNOSIS — N760 Acute vaginitis: Secondary | ICD-10-CM | POA: Insufficient documentation

## 2009-11-22 LAB — CONVERTED CEMR LAB
Bilirubin Urine: NEGATIVE
Blood in Urine, dipstick: NEGATIVE
Ketones, urine, test strip: NEGATIVE
Protein, U semiquant: NEGATIVE
Urobilinogen, UA: 0.2
Whiff Test: POSITIVE

## 2009-11-24 ENCOUNTER — Telehealth (INDEPENDENT_AMBULATORY_CARE_PROVIDER_SITE_OTHER): Payer: Self-pay | Admitting: *Deleted

## 2009-11-29 ENCOUNTER — Telehealth (INDEPENDENT_AMBULATORY_CARE_PROVIDER_SITE_OTHER): Payer: Self-pay | Admitting: *Deleted

## 2009-12-27 ENCOUNTER — Ambulatory Visit (HOSPITAL_BASED_OUTPATIENT_CLINIC_OR_DEPARTMENT_OTHER)
Admission: RE | Admit: 2009-12-27 | Discharge: 2009-12-27 | Payer: Self-pay | Source: Home / Self Care | Admitting: Family Medicine

## 2009-12-27 ENCOUNTER — Ambulatory Visit: Payer: Self-pay | Admitting: Radiology

## 2009-12-27 ENCOUNTER — Ambulatory Visit: Payer: Self-pay | Admitting: Family Medicine

## 2009-12-27 DIAGNOSIS — M545 Low back pain: Secondary | ICD-10-CM

## 2009-12-27 DIAGNOSIS — R5383 Other fatigue: Secondary | ICD-10-CM

## 2009-12-27 DIAGNOSIS — M2550 Pain in unspecified joint: Secondary | ICD-10-CM

## 2009-12-27 DIAGNOSIS — R5381 Other malaise: Secondary | ICD-10-CM

## 2009-12-28 ENCOUNTER — Telehealth (INDEPENDENT_AMBULATORY_CARE_PROVIDER_SITE_OTHER): Payer: Self-pay | Admitting: *Deleted

## 2009-12-28 LAB — CONVERTED CEMR LAB: Anti Nuclear Antibody(ANA): NEGATIVE

## 2010-01-02 ENCOUNTER — Telehealth (INDEPENDENT_AMBULATORY_CARE_PROVIDER_SITE_OTHER): Payer: Self-pay | Admitting: *Deleted

## 2010-01-02 LAB — CONVERTED CEMR LAB
ALT: 35 units/L (ref 0–35)
AST: 31 units/L (ref 0–37)
BUN: 12 mg/dL (ref 6–23)
Bilirubin, Direct: 0.1 mg/dL (ref 0.0–0.3)
Eosinophils Relative: 2.6 % (ref 0.0–5.0)
Folate: 10.7 ng/mL
Free T4: 0.8 ng/dL (ref 0.6–1.6)
GFR calc non Af Amer: 88.66 mL/min (ref >60)
HCT: 38.5 % (ref 36.0–46.0)
Lymphs Abs: 2.2 10*3/uL (ref 0.7–4.0)
Monocytes Relative: 9.9 % (ref 3.0–12.0)
Platelets: 249 10*3/uL (ref 150.0–400.0)
Potassium: 4 meq/L (ref 3.5–5.1)
Sodium: 143 meq/L (ref 135–145)
T3, Free: 2.7 pg/mL (ref 2.3–4.2)
Total Bilirubin: 0.6 mg/dL (ref 0.3–1.2)
Vitamin B-12: 295 pg/mL (ref 211–911)
WBC: 5.3 10*3/uL (ref 4.5–10.5)

## 2010-02-01 ENCOUNTER — Telehealth (INDEPENDENT_AMBULATORY_CARE_PROVIDER_SITE_OTHER): Payer: Self-pay | Admitting: *Deleted

## 2010-04-07 ENCOUNTER — Telehealth: Payer: Self-pay | Admitting: Family Medicine

## 2010-05-15 ENCOUNTER — Encounter (INDEPENDENT_AMBULATORY_CARE_PROVIDER_SITE_OTHER): Payer: Self-pay | Admitting: *Deleted

## 2010-09-07 ENCOUNTER — Encounter (INDEPENDENT_AMBULATORY_CARE_PROVIDER_SITE_OTHER): Payer: Self-pay | Admitting: *Deleted

## 2010-09-07 ENCOUNTER — Ambulatory Visit
Admission: RE | Admit: 2010-09-07 | Discharge: 2010-09-07 | Payer: Self-pay | Source: Home / Self Care | Attending: Family Medicine | Admitting: Family Medicine

## 2010-09-07 ENCOUNTER — Encounter: Payer: Self-pay | Admitting: Family Medicine

## 2010-09-07 ENCOUNTER — Other Ambulatory Visit: Payer: Self-pay | Admitting: Family Medicine

## 2010-09-07 DIAGNOSIS — Z780 Asymptomatic menopausal state: Secondary | ICD-10-CM | POA: Insufficient documentation

## 2010-09-07 LAB — TSH: TSH: 0.89 u[IU]/mL (ref 0.35–5.50)

## 2010-09-07 LAB — HEPATIC FUNCTION PANEL
ALT: 24 U/L (ref 0–35)
AST: 26 U/L (ref 0–37)
Albumin: 4 g/dL (ref 3.5–5.2)
Alkaline Phosphatase: 65 U/L (ref 39–117)
Bilirubin, Direct: 0.1 mg/dL (ref 0.0–0.3)
Total Bilirubin: 0.6 mg/dL (ref 0.3–1.2)
Total Protein: 7.1 g/dL (ref 6.0–8.3)

## 2010-09-07 LAB — LIPID PANEL
Cholesterol: 199 mg/dL (ref 0–200)
HDL: 49.4 mg/dL (ref >39.00)
LDL Cholesterol: 127 mg/dL — ABNORMAL HIGH (ref 0–99)
Total CHOL/HDL Ratio: 4
Triglycerides: 115 mg/dL (ref 0.0–149.0)
VLDL: 23 mg/dL (ref 0.0–40.0)

## 2010-09-07 LAB — CONVERTED CEMR LAB
Bilirubin Urine: NEGATIVE
Glucose, Urine, Semiquant: NEGATIVE
Ketones, urine, test strip: NEGATIVE
Nitrite: NEGATIVE
Specific Gravity, Urine: <1.005
pH: 6

## 2010-09-07 LAB — BASIC METABOLIC PANEL
BUN: 13 mg/dL (ref 6–23)
CO2: 30 mEq/L (ref 19–32)
Calcium: 9.3 mg/dL (ref 8.4–10.5)
Chloride: 102 mEq/L (ref 96–112)
Creatinine, Ser: 1 mg/dL (ref 0.4–1.2)
GFR: 76.16 mL/min (ref >60.00)
Glucose, Bld: 87 mg/dL (ref 70–99)
Potassium: 4.3 mEq/L (ref 3.5–5.1)
Sodium: 137 mEq/L (ref 135–145)

## 2010-09-07 LAB — CBC WITH DIFFERENTIAL/PLATELET
Basophils Absolute: 0 10*3/uL (ref 0.0–0.1)
Basophils Relative: 0.4 % (ref 0.0–3.0)
Eosinophils Absolute: 0.1 10*3/uL (ref 0.0–0.7)
Eosinophils Relative: 2.5 % (ref 0.0–5.0)
HCT: 39.8 % (ref 36.0–46.0)
Hemoglobin: 13.4 g/dL (ref 12.0–15.0)
Lymphocytes Relative: 41.7 % (ref 12.0–46.0)
Lymphs Abs: 2.1 10*3/uL (ref 0.7–4.0)
MCHC: 33.7 g/dL (ref 30.0–36.0)
MCV: 87.9 fl (ref 78.0–100.0)
Monocytes Absolute: 0.6 10*3/uL (ref 0.1–1.0)
Monocytes Relative: 10.7 % (ref 3.0–12.0)
Neutro Abs: 2.3 10*3/uL (ref 1.4–7.7)
Neutrophils Relative %: 44.7 % (ref 43.0–77.0)
Platelets: 260 10*3/uL (ref 150.0–400.0)
RBC: 4.53 Mil/uL (ref 3.87–5.11)
RDW: 14.1 % (ref 11.5–14.6)
WBC: 5.2 10*3/uL (ref 4.5–10.5)

## 2010-09-07 LAB — HEMOGLOBIN A1C: Hgb A1c MFr Bld: 6 % (ref 4.6–6.5)

## 2010-09-10 ENCOUNTER — Encounter: Payer: Self-pay | Admitting: Family Medicine

## 2010-09-11 ENCOUNTER — Telehealth: Payer: Self-pay | Admitting: Family Medicine

## 2010-09-14 ENCOUNTER — Ambulatory Visit (INDEPENDENT_AMBULATORY_CARE_PROVIDER_SITE_OTHER)
Admission: RE | Admit: 2010-09-14 | Discharge: 2010-09-14 | Payer: BC Managed Care – PPO | Source: Home / Self Care | Attending: Family Medicine | Admitting: Family Medicine

## 2010-09-14 ENCOUNTER — Encounter: Payer: Self-pay | Admitting: Family Medicine

## 2010-09-14 ENCOUNTER — Telehealth: Payer: Self-pay | Admitting: Gastroenterology

## 2010-09-14 DIAGNOSIS — Z1231 Encounter for screening mammogram for malignant neoplasm of breast: Secondary | ICD-10-CM

## 2010-09-14 LAB — HM MAMMOGRAPHY

## 2010-09-15 ENCOUNTER — Encounter: Payer: Self-pay | Admitting: Nurse Practitioner

## 2010-09-17 LAB — CONVERTED CEMR LAB
ALT: 23 units/L (ref 0–35)
AST: 26 units/L (ref 0–37)
AST: 27 units/L (ref 0–37)
Albumin: 3.8 g/dL (ref 3.5–5.2)
Albumin: 4.3 g/dL (ref 3.5–5.2)
Alkaline Phosphatase: 59 units/L (ref 39–117)
Alkaline Phosphatase: 59 units/L (ref 39–117)
BUN: 11 mg/dL (ref 6–23)
Basophils Absolute: 0 10*3/uL (ref 0.0–0.1)
Basophils Relative: 0.5 % (ref 0.0–3.0)
Basophils Relative: 0.6 % (ref 0.0–1.0)
Bilirubin Urine: NEGATIVE
Bilirubin, Direct: 0.1 mg/dL (ref 0.0–0.3)
Blood in Urine, dipstick: NEGATIVE
CO2: 30 meq/L (ref 19–32)
CO2: 31 meq/L (ref 19–32)
Calcium: 9.6 mg/dL (ref 8.4–10.5)
Calcium: 9.6 mg/dL (ref 8.4–10.5)
Chloride: 105 meq/L (ref 96–112)
Chloride: 107 meq/L (ref 96–112)
Cholesterol: 196 mg/dL (ref 0–200)
Creatinine, Ser: 0.9 mg/dL (ref 0.4–1.2)
Direct LDL: 147.8 mg/dL
Eosinophils Absolute: 0.1 10*3/uL (ref 0.0–0.7)
Eosinophils Absolute: 0.2 10*3/uL (ref 0.0–0.6)
Eosinophils Relative: 3.3 % (ref 0.0–5.0)
GFR calc Af Amer: 86 mL/min
GFR calc non Af Amer: 71 mL/min
Glucose, Bld: 89 mg/dL (ref 70–99)
Glucose, Bld: 90 mg/dL (ref 70–99)
Glucose, Urine, Semiquant: NEGATIVE
HCT: 38.1 % (ref 36.0–46.0)
HCT: 40.4 % (ref 36.0–46.0)
HDL: 48.3 mg/dL (ref >39.0)
Hemoglobin: 13.1 g/dL (ref 12.0–15.0)
Hemoglobin: 13.4 g/dL (ref 12.0–15.0)
Hgb A1c MFr Bld: 5.8 % (ref 4.6–6.5)
Ketones, urine, test strip: NEGATIVE
LDL Cholesterol: 131 mg/dL — ABNORMAL HIGH (ref 0–99)
Lymphocytes Relative: 39.7 % (ref 12.0–46.0)
Lymphocytes Relative: 41.8 % (ref 12.0–46.0)
Lymphs Abs: 2 10*3/uL (ref 0.7–4.0)
MCHC: 33.2 g/dL (ref 30.0–36.0)
MCHC: 34.3 g/dL (ref 30.0–36.0)
MCV: 87.2 fL (ref 78.0–100.0)
Monocytes Absolute: 0.4 10*3/uL (ref 0.2–0.7)
Monocytes Relative: 8.4 % (ref 3.0–11.0)
Monocytes Relative: 9.2 % (ref 3.0–12.0)
Neutro Abs: 2.2 10*3/uL (ref 1.4–7.7)
Neutro Abs: 2.4 10*3/uL (ref 1.4–7.7)
Neutrophils Relative %: 48 % (ref 43.0–77.0)
Nitrite: NEGATIVE
Pap Smear: NORMAL
Platelets: 253 10*3/uL (ref 150–400)
Potassium: 3.9 meq/L (ref 3.5–5.1)
Potassium: 4.1 meq/L (ref 3.5–5.1)
Protein, U semiquant: NEGATIVE
RBC: 4.37 M/uL (ref 3.87–5.11)
RBC: 4.46 M/uL (ref 3.87–5.11)
RDW: 13.6 % (ref 11.5–14.6)
RDW: 13.8 % (ref 11.5–14.6)
Sodium: 142 meq/L (ref 135–145)
Sodium: 144 meq/L (ref 135–145)
Specific Gravity, Urine: 1.025
TSH: 0.63 microintl units/mL (ref 0.35–5.50)
TSH: 0.82 microintl units/mL (ref 0.35–5.50)
Total Bilirubin: 0.9 mg/dL (ref 0.3–1.2)
Total CHOL/HDL Ratio: 4
Total CHOL/HDL Ratio: 4.1
Total Protein: 7.1 g/dL (ref 6.0–8.3)
Total Protein: 7.9 g/dL (ref 6.0–8.3)
Triglycerides: 110 mg/dL (ref 0.0–149.0)
Triglycerides: 86 mg/dL (ref 0–149)
Urobilinogen, UA: NEGATIVE
VLDL: 17 mg/dL (ref 0–40)
WBC Urine, dipstick: NEGATIVE
WBC: 4.9 10*3/uL (ref 4.5–10.5)
pH: 6

## 2010-09-20 ENCOUNTER — Encounter (INDEPENDENT_AMBULATORY_CARE_PROVIDER_SITE_OTHER): Payer: Self-pay | Admitting: *Deleted

## 2010-09-21 ENCOUNTER — Encounter: Payer: Self-pay | Admitting: Gastroenterology

## 2010-09-21 NOTE — Assessment & Plan Note (Signed)
Summary: F/U LABS AND GES                  Emily   History of Present Illness Visit Type: Follow-up Visit Primary GI MD: Emily Heaps MD [facility] Primary Provider: Loreen Freud, DO Requesting Provider: n/a Chief Complaint: follow up of abdominal pain and reflux.  Pt did not complete PrevPac nor is she taking Dexilant at this time.  Pt had flu last week and started Tamiflu, pt states she stomach began to hurt after 3-4 doses, so she stopped Tamiflu, Dexilant, and PrevPac History of Present Illness:   [name] has returned for followup of her dyspepsia.  Gastric emptying scan was normal.  Stool antigen was positive for H. pylori.  She began a Prevpac but discontinued this after 2-3 days because she developed pain while taking Tamiflu.   She did notice that she is clearly lactose intolerant.  Her main complaint remains bloating.  It is improved since following the lactose-free  diet.   GI Review of Systems    Reports bloating.      Denies abdominal pain, acid reflux, belching, chest pain, dysphagia with liquids, dysphagia with solids, heartburn, loss of appetite, nausea, vomiting, vomiting blood, weight loss, and  weight gain.        Denies anal fissure, black tarry stools, change in bowel habit, constipation, diarrhea, diverticulosis, fecal incontinence, heme positive stool, hemorrhoids, irritable bowel syndrome, jaundice, light color stool, liver problems, rectal bleeding, and  rectal pain.    Current Medications (verified): 1)  Proair Hfa 108 (90 Base) Mcg/act Aers (Albuterol Sulfate) .... Use 2 Puffs Every 4 Hours As Needed For Cough, Wheeze or Shortness of Breath. 2)  Allegra 180 Mg  Tabs (Fexofenadine Hcl) .Marland Kitchen.. 1 By Mouth Once Daily As Needed 3)  Advair Diskus 250-50 Mcg/dose  Misc (Fluticasone-Salmeterol) .Marland Kitchen.. 1 Inh Two Times A Day 4)  Freestyle Lite   Strp (Glucose Blood) .... Check Once Daily 5)  Alprazolam 0.25 Mg  Tabs (Alprazolam) .Marland Kitchen.. 1 By Mouth Three Times A Day As Needed 6)   Nasonex 50 Mcg/act Susp (Mometasone Furoate) .... Use 2 Sprays in Each Nostril Twice Daily. 7)  Lidoderm 5 % Ptch (Lidocaine) .Marland Kitchen.. 1 Every 12 Hours 8)  Vitamin D 60454 Unit Caps (Ergocalciferol) .... Take 1 By Mouth Weekly 9)  Flexeril 10 Mg Tabs (Cyclobenzaprine Hcl) .Marland Kitchen.. 1 By Mouth Three Times A Day As Needed 10)  Tylenol Extra Strength 500 Mg Tabs (Acetaminophen) .... As Needed 11)  Gas-X Extra Strength 125 Mg Chew (Simethicone) .... One Tablet By Mouth in The Morning and One Tablet By Mouth At Bedtime 12)  Dexilant 60 Mg Cpdr (Dexlansoprazole) .... Take 1 Capsule 30 Min Prior To Breakfast--Holding 13)  Align  Caps (Probiotic Product) .... Take 1 Capsule By Mouth Once A Day As Needed 14)  Albuterol Sulfate (2.5 Mg/32ml) 0.083% Nebu (Albuterol Sulfate) .Marland Kitchen.. 1 Via Neb Qid As Needed  Allergies (verified): 1)  ! Septra 2)  ! Asa 3)  ! Ibuprofen 4)  ! Doxycycline 5)  ! Ultram 6)  ! Gentamicin in Saline (Gentamicin in Saline) 7)  ! Zantac 8)  ! * Periactin 9)  ! Influenza A (H1n1) Monoval Vac (Influenza A (H1n1) Monoval Vac)  Past History:  Past Medical History: Reviewed history from 08/03/2009 and no changes required. Asthma Fibromyalgia Irritable Bowel Syndrome GERD Current Problems:  GERD (ICD-530.81) HELICOBACTER PYLORI GASTRITIS, HX OF (ICD-V12.79) OTHER ACUTE REACTIONS TO STRESS (ICD-308.3) ELBOW PAIN, RIGHT (ICD-719.42) SHOULDER PAIN,  RIGHT (ICD-719.41) ELEVATED BLOOD PRESSURE (ICD-796.2) DEPENDENT EDEMA, RIGHT LEG (ICD-782.3) CALF PAIN, RIGHT (ICD-729.5) SHINGLES (ICD-053.9) PREVENTIVE HEALTH CARE (ICD-V70.0) FAMILY HISTORY OF CAD FEMALE 1ST DEGREE RELATIVE <50 (ICD-V17.3) RUPTURE, ACHILLES TENDON (ICD-727.67) BUNIONECTOMY, HX OF (ICD-V15.89) GANGLION CYST, WRIST, RIGHT (ICD-727.41) ASTHMA (ICD-493.90) Hyperlipidemia  Past Surgical History: Reviewed history from 06/08/2008 and no changes required. Hysterectomy (2000) Ganglian csyt Foot surgery--- bunionectomy  1983 Tubal ligation achilles tendon rupture--2005  Family History: Reviewed history from 08/03/2009 and no changes required. Family History of CAD Female 1st degree relative <50 Family History High cholesterol Family History Hypertension Family History Kidney disease M-- AAA repair. HTN Family History Diabetes 1st degree relative--  F fam hx GB disease No FH of Colon Cancer:  Social History: Reviewed history from 09/01/2009 and no changes required. Occupation: [employer] Divorced Never Smoked Alcohol use-no Drug use-no Regular exercise-yes  Review of Systems  The patient denies allergy/sinus, anemia, anxiety-new, arthritis/joint pain, back pain, blood in urine, breast changes/lumps, confusion, cough, coughing up blood, depression-new, fainting, fatigue, fever, headaches-new, hearing problems, heart murmur, heart rhythm changes, itching, menstrual pain, muscle pains/cramps, night sweats, nosebleeds, pregnancy symptoms, shortness of breath, skin rash, sleeping problems, sore throat, swelling of feet/legs, swollen lymph glands, thirst - excessive, urination - excessive, urination changes/pain, urine leakage, vision changes, and voice change.    Vital Signs:  Patient profile:   50 year old female Height:      66.75 inches Weight:      213 pounds BMI:     33.73 Pulse rate:   76 / minute Pulse rhythm:   regular BP sitting:   100 / 66  (left arm) Cuff size:   large  Vitals Entered By: Francee Piccolo CMA Duncan Dull) (October 17, 2009 3:39 PM)   Impression & Recommendations:  Problem # 1:  DYSPEPSIA&OTHER SPEC DISORDERS FUNCTION STOMACH (ICD-536.8) Patient is lactose intolerant.  She also has H. pylori that was incompletely treated.  Recommendations #1 a 14 day course of Prevpac #2 Lactaid supplementation

## 2010-09-21 NOTE — Assessment & Plan Note (Signed)
Summary: F/U RUQ pain,bloating, gerd, saw NP    History of Present Illness Visit Type: Follow-up Visit Primary GI MD: Melvia Heaps MD [facility] Primary Provider: Loreen Freud, DO Requesting Provider: Loreen Freud, DO Chief Complaint: follow-up RUQ abdominal pain, bloating and GERD  saw NP  History of Present Illness:   Emily Osborne has returned for followup of her bowel distention.  She continues to complain of bloating and some mild postprandial nausea.  She denies pyrosis.  She remains on DEXILANT.  She claims that her symptoms are similar to when she had an infection with H. pylori.  She required 2 rounds of antibiotics for eradication.  We will observe that her symptoms were improving when she was taking align.   GI Review of Systems    Reports abdominal pain, belching, bloating, and  nausea.     Location of  Abdominal pain: generalized.    Denies acid reflux, chest pain, dysphagia with liquids, dysphagia with solids, heartburn, loss of appetite, vomiting, vomiting blood, weight loss, and  weight gain.      Reports change in bowel habits and  constipation.     Denies anal fissure, black tarry stools, diarrhea, diverticulosis, fecal incontinence, heme positive stool, hemorrhoids, irritable bowel syndrome, jaundice, light color stool, liver problems, rectal bleeding, and  rectal pain.    Current Medications (verified): 1)  Proair Hfa 108 (90 Base) Mcg/act Aers (Albuterol Sulfate) .... Use 2 Puffs Every 4 Hours As Needed For Cough, Wheeze or Shortness of Breath. 2)  Allegra 180 Mg  Tabs (Fexofenadine Hcl) .Marland Kitchen.. 1 By Mouth Once Daily As Needed 3)  Advair Diskus 250-50 Mcg/dose  Misc (Fluticasone-Salmeterol) .Marland Kitchen.. 1 Inh Two Times A Day 4)  Freestyle Lite   Strp (Glucose Blood) .... Check Once Daily 5)  Alprazolam 0.25 Mg  Tabs (Alprazolam) .Marland Kitchen.. 1 By Mouth Three Times A Day As Needed 6)  Nasonex 50 Mcg/act Susp (Mometasone Furoate) .... Use 2 Sprays in Each Nostril Twice Daily. 7)  Lidoderm 5 %  Ptch (Lidocaine) .Marland Kitchen.. 1 Every 12 Hours 8)  Vitamin D 16109 Unit Caps (Ergocalciferol) .... Take 1 By Mouth Weekly 9)  Flexeril 10 Mg Tabs (Cyclobenzaprine Hcl) .Marland Kitchen.. 1 By Mouth Three Times A Day As Needed 10)  Welchol 3.75 Gm Pack (Colesevelam Hcl) .Marland Kitchen.. 1 Packet As Directed By Mouth Daily. 11)  Tylenol Extra Strength 500 Mg Tabs (Acetaminophen) .... As Needed 12)  Gas-X Extra Strength 125 Mg Chew (Simethicone) .... One Tablet By Mouth in The Morning and One Tablet By Mouth At Bedtime 13)  Dexilant 60 Mg Cpdr (Dexlansoprazole) .... Take 1 Capsule 30 Min Prior To Breakfast  Allergies (verified): 1)  ! Septra 2)  ! Asa 3)  ! Ibuprofen 4)  ! Doxycycline 5)  ! Ultram 6)  ! Gentamicin in Saline (Gentamicin in Saline) 7)  ! Zantac (Ranitidine Hcl) 8)  ! * Periactin 9)  ! Influenza A (H1n1) Monoval Vac (Influenza A (H1n1) Monoval Vac)  Past History:  Past Medical History: Reviewed history from 08/03/2009 and no changes required. Asthma Fibromyalgia Irritable Bowel Syndrome GERD Current Problems:  GERD (ICD-530.81) HELICOBACTER PYLORI GASTRITIS, HX OF (ICD-V12.79) OTHER ACUTE REACTIONS TO STRESS (ICD-308.3) ELBOW PAIN, RIGHT (ICD-719.42) SHOULDER PAIN, RIGHT (ICD-719.41) ELEVATED BLOOD PRESSURE (ICD-796.2) DEPENDENT EDEMA, RIGHT LEG (ICD-782.3) CALF PAIN, RIGHT (ICD-729.5) SHINGLES (ICD-053.9) PREVENTIVE HEALTH CARE (ICD-V70.0) FAMILY HISTORY OF CAD FEMALE 1ST DEGREE RELATIVE <50 (ICD-V17.3) RUPTURE, ACHILLES TENDON (ICD-727.67) BUNIONECTOMY, HX OF (ICD-V15.89) GANGLION CYST, WRIST, RIGHT (ICD-727.41) ASTHMA (ICD-493.90) Hyperlipidemia  Past Surgical History: Reviewed history from 06/08/2008 and no changes required. Hysterectomy (2000) Ganglian csyt Foot surgery--- bunionectomy 1983 Tubal ligation achilles tendon rupture--2005  Family History: Reviewed history from 08/03/2009 and no changes required. Family History of CAD Female 1st degree relative <50 Family History High  cholesterol Family History Hypertension Family History Kidney disease M-- AAA repair. HTN Family History Diabetes 1st degree relative--  F fam hx GB disease No FH of Colon Cancer:  Social History: Reviewed history from 03/18/2007 and no changes required. Occupation: [employer] Divorced Never Smoked Alcohol use-no Drug use-no Regular exercise-yes  Review of Systems       The patient complains of headaches-new.  The patient denies allergy/sinus, anemia, anxiety-new, arthritis/joint pain, back pain, blood in urine, breast changes/lumps, change in vision, confusion, cough, coughing up blood, depression-new, fainting, fatigue, fever, hearing problems, heart murmur, heart rhythm changes, itching, muscle pains/cramps, night sweats, nosebleeds, shortness of breath, skin rash, sleeping problems, sore throat, swelling of feet/legs, swollen lymph glands, thirst - excessive, urination - excessive, urination changes/pain, urine leakage, vision changes, and voice change.    Vital Signs:  Patient profile:   50 year old female Height:      65.75 inches Weight:      217 pounds BMI:     35.42 Pulse rhythm:   regular BP sitting:   140 / 80  (left arm)  Vitals Entered By: Milford Cage NCMA (September 01, 2009 10:08 AM)   Impression & Recommendations:  Problem # 1:  DYSPEPSIA&OTHER SPEC DISORDERS FUNCTION STOMACH (ICD-536.8) Symptoms could be due to recurrent H. pylori infection, IBS or possibly gastroparesis.  Recommendations #1 check stools for H. pylori antigen #2 resume align #3 gastric emptying  scan if symptoms have not improved and results are negative  Other Orders: TLB-H. Pylori Abs(Helicobacter Pylori) ([id number]) Prescriptions: ALIGN  CAPS (PROBIOTIC PRODUCT) date one capsule daily  #30 x 2   Entered and Authorized by:   Louis Meckel MD   Signed by:   Louis Meckel MD on 09/01/2009   Method used:   Print then Give to Patient   RxID:   [id number]

## 2010-09-21 NOTE — Assessment & Plan Note (Signed)
Summary: CPX//FASTING/   Vital Signs:  Patient profile:   50 year old female Menstrual status:  hysterectomy Height:      65.5 inches Weight:      218.6 pounds BMI:     35.95 Pulse rate:   60 / minute Pulse rhythm:   regular BP sitting:   126 / 90  (left arm) Cuff size:   large  Vitals Entered By: Almeta Monas CMA Duncan Dull) (September 07, 2010 8:47 AM) CC: cpx/fasting--no pap, Back Pain     Menstrual Status hysterectomy Last PAP Result NEGATIVE FOR INTRAEPITHELIAL LESIONS OR MALIGNANCY.   History of Present Illness: Pt here for cpe, labs ,  no pap--- pt s/p tah--1 ovary secondary to cyst.     Hyperlipidemia follow-up      This is a 50 year old woman who presents for Hyperlipidemia follow-up.  The patient denies muscle aches, GI upset, abdominal pain, flushing, itching, constipation, diarrhea, and fatigue.  The patient denies the following symptoms: chest pain/pressure, exercise intolerance, dypsnea, palpitations, syncope, and pedal edema.  Compliance with medications (by patient report) has been near 100%.  Dietary compliance has been good.  The patient reports no exercise.         The patient also presents with Back Pain.  The symptoms began 4-6 months ago.  The patient denies fever, chills, weakness, loss of sensation, fecal incontinence, urinary incontinence, urinary retention, dysuria, rest pain, inability to work, and inability to care for self.  The pain is located in the left low back.  The pain began at home, gradually, and after overuse.  The pain is made worse by lying down and flexion.  The pain is made better by inactivity and heat.  Risk factors for serious underlying conditions include duration of pain > 1 month.  No known injury.  Asthma History    Asthma Control Assessment:    Age range: 0-4 years    Symptoms: 0-2 days/week    Nighttime Awakenings: 0-1/month    Interferes w/ normal activity: no limitations    SABA use (not for EIB): 0-2 days/week    Exacerbations  requiring oral systemic steroids: 0-1/year    Asthma Control Assessment: Well Controlled   Preventive Screening-Counseling & Management  Alcohol-Tobacco     Alcohol drinks/day: 0     Smoking Status: never     Passive Smoke Exposure: no  Caffeine-Diet-Exercise     Caffeine use/day: 2     Does Patient Exercise: yes     Type of exercise: gym-- plans to go back     Times/week: 0  Hep-HIV-STD-Contraception     HIV Risk: no     Dental Visit-last 6 months yes     Dental Care Counseling: not indicated; dental care within six months     Sun Exposure-Excessive: frequently  Safety-Violence-Falls     Seat Belt Use: 100      Drug Use:  no.    Current Medications (verified): 1)  Proair Hfa 108 (90 Base) Mcg/act Aers (Albuterol Sulfate) .... Use 2 Puffs Every 4 Hours As Needed For Cough, Wheeze or Shortness of Breath. 2)  Allegra 180 Mg  Tabs (Fexofenadine Hcl) .Marland Kitchen.. 1 By Mouth Once Daily As Needed 3)  Advair Diskus 250-50 Mcg/dose  Misc (Fluticasone-Salmeterol) .Marland Kitchen.. 1 Inh Two Times A Day 4)  Freestyle Lite   Strp (Glucose Blood) .... Check Once Daily 5)  Alprazolam 0.25 Mg  Tabs (Alprazolam) .Marland Kitchen.. 1 By Mouth Three Times A Day As Needed 6)  Nasonex  50 Mcg/act Susp (Mometasone Furoate) .... Use 2 Sprays in Each Nostril Twice Daily. 7)  Lidoderm 5 % Ptch (Lidocaine) .Marland Kitchen.. 1 Every 12 Hours 8)  Flexeril 10 Mg Tabs (Cyclobenzaprine Hcl) .Marland Kitchen.. 1 By Mouth Three Times A Day As Needed 9)  Tylenol Extra Strength 500 Mg Tabs (Acetaminophen) .... As Needed 10)  Dexilant 60 Mg Cpdr (Dexlansoprazole) .... Take 1 Capsule 30 Min Prior To Breakfast--Holding 11)  Albuterol Sulfate (2.5 Mg/27ml) 0.083% Nebu (Albuterol Sulfate) .Marland Kitchen.. 1 Via Neb Qid As Needed 12)  Promethazine Hcl 25 Mg Tabs (Promethazine Hcl) .Marland Kitchen.. 1 By Mouth Qid As Needed 13)  Vitamin D3 1000 Unit Caps (Cholecalciferol) .Marland Kitchen.. 1 By Mouth Daily 14)  Citracal Calcium+d 600-40-500 Mg-Mg-Unit Xr24h-Tab (Calcium-Magnesium-Vitamin D) .Marland Kitchen.. 1 By Mouth Two  Times A Day  Allergies (verified): 1)  ! Septra 2)  ! Asa 3)  ! Ibuprofen 4)  ! Doxycycline 5)  ! Ultram 6)  ! Gentamicin in Saline 7)  ! Zantac 8)  ! * Periactin 9)  ! Influenza A (H1n1) Monoval Vac (Influenza A (H1n1) Monoval Vac) 10)  ! * Fluconazole  Past History:  Past Medical History: Last updated: 08/03/2009 Asthma Fibromyalgia Irritable Bowel Syndrome GERD Current Problems:  GERD (ICD-530.81) HELICOBACTER PYLORI GASTRITIS, HX OF (ICD-V12.79) OTHER ACUTE REACTIONS TO STRESS (ICD-308.3) ELBOW PAIN, RIGHT (ICD-719.42) SHOULDER PAIN, RIGHT (ICD-719.41) ELEVATED BLOOD PRESSURE (ICD-796.2) DEPENDENT EDEMA, RIGHT LEG (ICD-782.3) CALF PAIN, RIGHT (ICD-729.5) SHINGLES (ICD-053.9) PREVENTIVE HEALTH CARE (ICD-V70.0) FAMILY HISTORY OF CAD FEMALE 1ST DEGREE RELATIVE <50 (ICD-V17.3) RUPTURE, ACHILLES TENDON (ICD-727.67) BUNIONECTOMY, HX OF (ICD-V15.89) GANGLION CYST, WRIST, RIGHT (ICD-727.41) ASTHMA (ICD-493.90) Hyperlipidemia  Past Surgical History: Last updated: 06/08/2008 Hysterectomy (2000) Ganglian csyt Foot surgery--- bunionectomy 1983 Tubal ligation achilles tendon rupture--2005  Family History: Last updated: 09/07/2010 Family History of CAD Female 1st degree relative <50 Family History High cholesterol Family History Hypertension Family History Kidney disease M-- AAA repair. HTN Family History Diabetes 1st degree relative--  F fam hx GB disease No FH of Colon Cancer: B--leukemia  Social History: Last updated: 09/01/2009 Occupation: [employer] Divorced Never Smoked Alcohol use-no Drug use-no Regular exercise-yes  Risk Factors: Alcohol Use: 0 (09/07/2010) Caffeine Use: 2 (09/07/2010) Exercise: yes (09/07/2010)  Risk Factors: Smoking Status: never (09/07/2010) Passive Smoke Exposure: no (09/07/2010)  Family History: Reviewed history from 08/03/2009 and no changes required. Family History of CAD Female 1st degree relative <50 Family  History High cholesterol Family History Hypertension Family History Kidney disease M-- AAA repair. HTN Family History Diabetes 1st degree relative--  F fam hx GB disease No FH of Colon Cancer: B--leukemia  Social History: Reviewed history from 09/01/2009 and no changes required. Occupation: [employer] manager [employer] Divorced Never Smoked Alcohol use-no Drug use-no Regular exercise-yes Caffeine use/day:  2 Dental Care w/in 6 mos.:  yes  Review of Systems      See HPI General:  Denies chills, fatigue, fever, loss of appetite, malaise, sleep disorder, sweats, weakness, and weight loss. Eyes:  Denies blurring, discharge, double vision, eye irritation, eye pain, halos, itching, light sensitivity, red eye, vision loss-1 eye, and vision loss-both eyes; optho q1y. ENT:  Denies decreased hearing, difficulty swallowing, ear discharge, earache, hoarseness, nasal congestion, nosebleeds, postnasal drainage, ringing in ears, sinus pressure, and sore throat. CV:  Denies bluish discoloration of lips or nails, chest pain or discomfort, difficulty breathing at night, difficulty breathing while lying down, fainting, fatigue, leg cramps with exertion, lightheadness, near fainting, palpitations, shortness of breath with exertion, swelling of feet,  swelling of hands, and weight gain. Resp:  Denies chest discomfort, chest pain with inspiration, cough, coughing up blood, excessive snoring, hypersomnolence, morning headaches, pleuritic, shortness of breath, sputum productive, and wheezing. GI:  Denies abdominal pain, bloody stools, change in bowel habits, constipation, dark tarry stools, diarrhea, excessive appetite, gas, hemorrhoids, indigestion, loss of appetite, nausea, vomiting, vomiting blood, and yellowish skin color. GU:  Denies abnormal vaginal bleeding, decreased libido, discharge, dysuria, genital sores, hematuria, incontinence, nocturia, urinary frequency, and urinary hesitancy. MS:  Complains of low  back pain; denies joint pain, joint redness, joint swelling, loss of strength, mid back pain, muscle aches, muscle , cramps, muscle weakness, stiffness, and thoracic pain. Derm:  Denies changes in color of skin, changes in nail beds, dryness, excessive perspiration, flushing, hair loss, insect bite(s), itching, lesion(s), poor wound healing, and rash. Neuro:  Denies brief paralysis, difficulty with concentration, disturbances in coordination, falling down, headaches, inability to speak, memory loss, numbness, poor balance, seizures, sensation of room spinning, tingling, tremors, visual disturbances, and weakness. Psych:  Denies alternate hallucination ( auditory/visual), anxiety, depression, easily angered, easily tearful, irritability, mental problems, panic attacks, sense of great danger, suicidal thoughts/plans, thoughts of violence, unusual visions or sounds, and thoughts /plans of harming others. Endo:  Denies cold intolerance, excessive hunger, excessive thirst, excessive urination, heat intolerance, polyuria, and weight change. Heme:  Denies abnormal bruising, bleeding, enlarge lymph nodes, fevers, pallor, and skin discoloration. Allergy:  Denies hives or rash, itching eyes, persistent infections, seasonal allergies, and sneezing.  Physical Exam  General:  Well-developed,well-nourished,in no acute distress; alert,appropriate and cooperative throughout examination Head:  Normocephalic and atraumatic without obvious abnormalities. No apparent alopecia or balding. Eyes:  pupils equal, pupils round, pupils reactive to light, and no injection.   Ears:  External ear exam shows no significant lesions or deformities.  Otoscopic examination reveals clear canals, tympanic membranes are intact bilaterally without bulging, retraction, inflammation or discharge. Hearing is grossly normal bilaterally. Nose:  External nasal examination shows no deformity or inflammation. Nasal mucosa are pink and moist without  lesions or exudates. Mouth:  Oral mucosa and oropharynx without lesions or exudates.  Teeth in good repair. Neck:  No deformities, masses, or tenderness noted.no carotid bruits.   Chest Wall:  No deformities, masses, or tenderness noted. Breasts:  No mass, nodules, thickening, tenderness, bulging, retraction, inflamation, nipple discharge or skin changes noted.   Lungs:  Normal respiratory effort, chest expands symmetrically. Lungs are clear to auscultation, no crackles or wheezes. Heart:  normal rate and no murmur.   Abdomen:  Bowel sounds positive,abdomen soft and non-tender without masses, organomegaly or hernias noted. Msk:  normal ROM, no joint tenderness, no joint swelling, no joint warmth, no redness over joints, no joint deformities, no joint instability, and no crepitation.   Pulses:  R posterior tibial normal, R dorsalis pedis normal, R carotid normal, L posterior tibial normal, L dorsalis pedis normal, and L carotid normal.   Extremities:  No clubbing, cyanosis, edema, or deformity noted with normal full range of motion of all joints.   Neurologic:  No cranial nerve deficits noted. Station and gait are normal. Plantar reflexes are down-going bilaterally. DTRs are symmetrical throughout. Sensory, motor and coordinative functions appear intact. Skin:  Intact without suspicious lesions or rashes Cervical Nodes:  No lymphadenopathy noted Axillary Nodes:  No palpable lymphadenopathy Psych:  Cognition and judgment appear intact. Alert and cooperative with normal attention span and concentration. No apparent delusions, illusions, hallucinations   Impression & Recommendations:  Problem #  1:  PREVENTIVE HEALTH CARE (ICD-V70.0) ghm utd Orders: Venipuncture (54098) TLB-Lipid Panel (80061-LIPID) TLB-BMP (Basic Metabolic Panel-BMET) (80048-METABOL) TLB-CBC Platelet - w/Differential (85025-CBCD) TLB-Hepatic/Liver Function Pnl (80076-HEPATIC) TLB-TSH (Thyroid Stimulating Hormone)  (84443-TSH) TLB-A1C / Hgb A1C (Glycohemoglobin) (83036-A1C) Radiology Referral (Radiology) Specimen Handling (11914) Gastroenterology Referral (GI) UA Dipstick W/ Micro (manual) (81000) EKG w/ Interpretation (93000)  Problem # 2:  POSTMENOPAUSAL STATUS (ICD-V49.81)  Orders: Radiology Referral (Radiology) EKG w/ Interpretation (93000)  Problem # 3:  LOW BACK PAIN, CHRONIC (ICD-724.2)  Her updated medication list for this problem includes:    Flexeril 10 Mg Tabs (Cyclobenzaprine hcl) .Marland Kitchen... 1 by mouth three times a day as needed    Tylenol Extra Strength 500 Mg Tabs (Acetaminophen) .Marland Kitchen... As needed  Orders: T-Lumbar Spine 2 Views (72100TC) EKG w/ Interpretation (93000)  Discussed use of moist heat or ice, modified activities, medications, and stretching/strengthening exercises. Back care instructions given. To be seen in 2 weeks if no improvement; sooner if worsening of symptoms.   Problem # 4:  HYPERLIPIDEMIA (ICD-272.4)  Orders: Venipuncture (78295) TLB-Lipid Panel (80061-LIPID) TLB-BMP (Basic Metabolic Panel-BMET) (80048-METABOL) TLB-CBC Platelet - w/Differential (85025-CBCD) TLB-Hepatic/Liver Function Pnl (80076-HEPATIC) TLB-TSH (Thyroid Stimulating Hormone) (84443-TSH) TLB-A1C / Hgb A1C (Glycohemoglobin) (83036-A1C) Specimen Handling (62130) EKG w/ Interpretation (93000)  Labs Reviewed: SGOT: 31 (12/27/2009)   SGPT: 35 (12/27/2009)  Prior 10 Yr Risk Heart Disease: 3 % (09/14/2008)   HDL:54.70 (10/20/2009), 53.70 (07/06/2009)  LDL:112 (10/20/2009), DEL (06/08/2008)  Chol:192 (10/20/2009), 215 (07/06/2009)  Trig:127.0 (10/20/2009), 110.0 (07/06/2009)  Problem # 5:  HYPERGLYCEMIA, FASTING (ICD-790.29)  Orders: Venipuncture (86578) TLB-Lipid Panel (80061-LIPID) TLB-BMP (Basic Metabolic Panel-BMET) (80048-METABOL) TLB-CBC Platelet - w/Differential (85025-CBCD) TLB-Hepatic/Liver Function Pnl (80076-HEPATIC) TLB-TSH (Thyroid Stimulating Hormone) (84443-TSH) TLB-A1C /  Hgb A1C (Glycohemoglobin) (83036-A1C) Specimen Handling (46962) EKG w/ Interpretation (93000)  Labs Reviewed: Creat: 0.9 (12/27/2009)     Problem # 6:  GERD (ICD-530.81)  Her updated medication list for this problem includes:    Dexilant 60 Mg Cpdr (Dexlansoprazole) .Marland Kitchen... Take 1 capsule 30 min prior to breakfast--holding  Diagnostics Reviewed:  EGD: Location: [facility]   (04/28/2008) Discussed lifestyle modifications, diet, antacids/medications, and preventive measures. Handout provided.   Orders: EKG w/ Interpretation (93000)  Complete Medication List: 1)  Proair Hfa 108 (90 Base) Mcg/act Aers (Albuterol sulfate) .... Use 2 puffs every 4 hours as needed for cough, wheeze or shortness of breath. 2)  Allegra 180 Mg Tabs (Fexofenadine hcl) .Marland Kitchen.. 1 by mouth once daily as needed 3)  Advair Diskus 250-50 Mcg/dose Misc (Fluticasone-salmeterol) .Marland Kitchen.. 1 inh two times a day 4)  Freestyle Lite Strp (Glucose blood) .... Check once daily 5)  Alprazolam 0.25 Mg Tabs (Alprazolam) .Marland Kitchen.. 1 by mouth three times a day as needed 6)  Nasonex 50 Mcg/act Susp (Mometasone furoate) .... Use 2 sprays in each nostril twice daily. 7)  Lidoderm 5 % Ptch (Lidocaine) .Marland Kitchen.. 1 every 12 hours 8)  Flexeril 10 Mg Tabs (Cyclobenzaprine hcl) .Marland Kitchen.. 1 by mouth three times a day as needed 9)  Tylenol Extra Strength 500 Mg Tabs (Acetaminophen) .... As needed 10)  Dexilant 60 Mg Cpdr (Dexlansoprazole) .... Take 1 capsule 30 min prior to breakfast--holding 11)  Albuterol Sulfate (2.5 Mg/64ml) 0.083% Nebu (Albuterol sulfate) .Marland Kitchen.. 1 via neb qid as needed 12)  Promethazine Hcl 25 Mg Tabs (Promethazine hcl) .Marland Kitchen.. 1 by mouth qid as needed 13)  Vitamin D3 1000 Unit Caps (Cholecalciferol) .Marland Kitchen.. 1 by mouth daily 14)  Citracal Calcium+d 600-40-500 Mg-mg-unit Xr24h-tab (Calcium-magnesium-vitamin d) .Marland KitchenMarland KitchenMarland Kitchen 1  by mouth two times a day  Other Orders: Tdap => 26yrs IM (16109) Admin 1st Vaccine (60454) Prescriptions: ALPRAZOLAM  0.25 MG  TABS (ALPRAZOLAM) 1 by mouth three times a day as needed  #60 x 0   Entered and Authorized by:   Loreen Freud DO   Signed by:   Loreen Freud DO on 09/07/2010   Method used:   Print then Give to Patient   RxID:   [id number] TYLENOL EXTRA STRENGTH 500 MG TABS (ACETAMINOPHEN) as needed  #1 bottle x 2   Entered and Authorized by:   Loreen Freud DO   Signed by:   Loreen Freud DO on 09/07/2010   Method used:   [facility] to        [facility] [phone]* (retail)       [address]       [place], [place]  [address]       Ph: [phone]       Fax: [phone]   RxID:   [id number] FLEXERIL 10 MG TABS (CYCLOBENZAPRINE HCL) 1 by mouth three times a day as needed  #30 x 0   Entered and Authorized by:   Loreen Freud DO   Signed by:   Loreen Freud DO on 09/07/2010   Method used:   [facility] to        [facility] [phone]* (retail)       [address]       [place], [place]  [address]       Ph: [phone]       Fax: [phone]   RxID:   [id number] FREESTYLE LITE   STRP (GLUCOSE BLOOD) check once daily  #50 x 6   Entered and Authorized by:   Loreen Freud DO   Signed by:   Loreen Freud DO on 09/07/2010   Method used:   [facility] to        [facility] [phone]* (retail)       [address]       [place], [place]  [address]       Ph: [phone]       Fax: [phone]   RxID:   [id number] NASONEX 50 MCG/ACT SUSP (MOMETASONE FUROATE) Use 2 sprays in each nostril twice daily.  #1 x 5   Entered and Authorized by:   Loreen Freud DO   Signed by:   Loreen Freud DO on 09/07/2010   Method used:   [facility] to        [facility] [phone]* (retail)       [address]       [place], [place]  [address]       Ph: [phone]       Fax: [phone]   RxID:   [id number] ADVAIR DISKUS 250-50 MCG/DOSE  MISC (FLUTICASONE-SALMETEROL)  1 inh two times a day  #1 x 11   Entered and Authorized by:   Loreen Freud DO   Signed by:   Loreen Freud DO on 09/07/2010   Method used:   [facility] to        [facility] [phone]* (retail)       [address]       [place], [place]  [address]  Ph: [phone]       Fax: [phone]   RxID:   [id number] ALLEGRA 180 MG  TABS (FEXOFENADINE HCL) 1 by mouth once daily as needed  #30 Tablet x 11   Entered and Authorized by:   Loreen Freud DO   Signed by:   Loreen Freud DO on 09/07/2010   Method used:   [facility] to        [facility] [phone]* (retail)       [address]       [place], [place]  [address]       Ph: [phone]       Fax: [phone]   RxID:   [id number] PROAIR HFA 108 (90 BASE) MCG/ACT AERS (ALBUTEROL SULFATE) Use 2 puffs every 4 hours as needed for cough, wheeze or shortness of breath.  #1 x 1   Entered and Authorized by:   Loreen Freud DO   Signed by:   Loreen Freud DO on 09/07/2010   Method used:   [facility]ronically to        [facility] [phone]* (retail)       [address]       [place], [place]  [address]       Ph: [phone]       Fax: [phone]   RxID:   [id number]    Orders Added: 1)  Venipuncture [61607] 2)  TLB-Lipid Panel [80061-LIPID] 3)  TLB-BMP (Basic Metabolic Panel-BMET) [80048-METABOL] 4)  TLB-CBC Platelet - w/Differential [85025-CBCD] 5)  TLB-Hepatic/Liver Function Pnl [80076-HEPATIC] 6)  TLB-TSH (Thyroid Stimulating Hormone) [84443-TSH] 7)  TLB-A1C / Hgb A1C (Glycohemoglobin) [83036-A1C] 8)  T-Lumbar Spine 2 Views [72100TC] 9)  Radiology Referral [Radiology] 10)  Tdap => 74yrs IM [90715] 11)  Admin 1st Vaccine [90471] 12)  Specimen Handling [99000] 13)  Gastroenterology Referral [GI] 14)  Radiology Referral [Radiology] 15)  UA Dipstick W/ Micro (manual) [81000] 16)  Est. Patient 40-64 years [99396] 17)  EKG w/  Interpretation [93000]   Immunizations Administered:  Tetanus Vaccine:    Vaccine Type: Tdap    Site: left deltoid    Mfr: Merck    Dose: 0.5 ml    Route: IM    Given by: Almeta Monas CMA (AAMA)    Exp. Date: 06/08/2012    Lot #: [id number]    VIS given: 07/07/08 version given September 07, 2010.   Immunizations Administered:  Tetanus Vaccine:    Vaccine Type: Tdap    Site: left deltoid    Mfr: Merck    Dose: 0.5 ml    Route: IM    Given by: Almeta Monas CMA (AAMA)    Exp. Date: 06/08/2012    Lot #: [id number]    VIS given: 07/07/08 version given September 07, 2010.  Last Flu Vaccine:  Fluvax 3+ (05/05/2008 9:22:21 AM) Flu Vaccine Next Due:  Refused  Laboratory Results   Urine Tests   Date/Time Reported: September 07, 2010 10:35 AM   Routine Urinalysis   Color: lt. yellow Appearance: Clear Glucose: negative   (Normal Range: Negative) Bilirubin: negative   (Normal Range: Negative) Ketone: negative   (Normal Range: Negative) Spec. Gravity: <1.005   (Normal Range: 1.003-1.035) Blood: negative   (Normal Range: Negative) pH: 6.0   (Normal Range: 5.0-8.0) Protein: negative   (Normal Range: Negative) Urobilinogen: negative   (Normal Range: 0-1) Nitrite: negative   (Normal Range: Negative) Leukocyte  Esterace: negative   (Normal Range: Negative)    Comments: Floydene Flock  September 07, 2010 10:36 AM

## 2010-09-21 NOTE — Progress Notes (Signed)
Summary: reaction to Flagyl  Phone Note Call from Patient Call back at Home Phone [phone]   Caller: Patient Summary of Call: Pt was given rx for Flagyl, forgot to call last 2 days, was on two times a day.  On sunday dace was red all the way pt D/C on sunday. Monday face looked better and today face back to normal, Only had a few days of flagyl, can something else be called in its place? Initial call taken by: Alida Munley,  November 29, 2009 4:50 PM  Follow-up for Phone Call        clindamycin 300mg 1 by mouth two times a day for 7 days  Follow-up by: Yvonne Lowne DO,  November 29, 2009 9:32 PM  Additional Follow-up for Phone Call Additional follow up Details #1::        [facility] left msg for patient .Alida Whitlatch  November 30, 2009 8:20 AM  Additional Follow-up by: Alida Ringgenberg,  November 30, 2009 8:20 AM    New/Updated Medications: CLINDAMYCIN HCL 300 MG CAPS (CLINDAMYCIN HCL) 1 by mouth two times a day for 7 days Prescriptions: CLINDAMYCIN HCL 300 MG CAPS (CLINDAMYCIN HCL) 1 by mouth two times a day for 7 days  #14 x 0   Entered by:   Alida Muccio   Authorized by:   Yvonne Lowne DO   Signed by:   Alida Howse on 11/30/2009   Method used:   [facility] ...       [id number] (retail)       [address]       [place], [place]  [address]       Ph: [phone]       Fax: [phone]   RxID:   [id number]

## 2010-09-21 NOTE — Assessment & Plan Note (Signed)
Summary: pain in joints//lch   Vital Signs:  Patient profile:   50 year old female Weight:      213.50 pounds Pulse rate:   86 / minute Pulse rhythm:   regular BP sitting:   112 / 76  (left arm) Cuff size:   large  Vitals Entered By: Army Fossa CMA (Dec 27, 2009 1:59 PM) CC: Pt here for c/o pain in her joints primarily her hands.   History of Present Illness: Pt here c/o pain and swelling in L hand only. Pt also c/o extreme fatigue and weakness in legs.  Pt states back was really bothering her last week.  She had to use pain patches --2 a day because pain was so bad.    Allergies: 1)  ! Septra 2)  ! Asa 3)  ! Ibuprofen 4)  ! Doxycycline 5)  ! Ultram 6)  ! Gentamicin in Saline (Gentamicin in Saline) 7)  ! Zantac 8)  ! * Periactin 9)  ! Influenza A (H1n1) Monoval Vac (Influenza A (H1n1) Monoval Vac) 10)  ! * Fluconazole  Past History:  Past medical, surgical, family and social histories (including risk factors) reviewed for relevance to current acute and chronic problems.  Past Medical History: Reviewed history from 08/03/2009 and no changes required. Asthma Fibromyalgia Irritable Bowel Syndrome GERD Current Problems:  GERD (ICD-530.81) HELICOBACTER PYLORI GASTRITIS, HX OF (ICD-V12.79) OTHER ACUTE REACTIONS TO STRESS (ICD-308.3) ELBOW PAIN, RIGHT (ICD-719.42) SHOULDER PAIN, RIGHT (ICD-719.41) ELEVATED BLOOD PRESSURE (ICD-796.2) DEPENDENT EDEMA, RIGHT LEG (ICD-782.3) CALF PAIN, RIGHT (ICD-729.5) SHINGLES (ICD-053.9) PREVENTIVE HEALTH CARE (ICD-V70.0) FAMILY HISTORY OF CAD FEMALE 1ST DEGREE RELATIVE <50 (ICD-V17.3) RUPTURE, ACHILLES TENDON (ICD-727.67) BUNIONECTOMY, HX OF (ICD-V15.89) GANGLION CYST, WRIST, RIGHT (ICD-727.41) ASTHMA (ICD-493.90) Hyperlipidemia  Past Surgical History: Reviewed history from 06/08/2008 and no changes required. Hysterectomy (2000) Ganglian csyt Foot surgery--- bunionectomy 1983 Tubal ligation achilles tendon  rupture--2005  Family History: Reviewed history from 08/03/2009 and no changes required. Family History of CAD Female 1st degree relative <50 Family History High cholesterol Family History Hypertension Family History Kidney disease M-- AAA repair. HTN Family History Diabetes 1st degree relative--  F fam hx GB disease No FH of Colon Cancer:  Social History: Reviewed history from 09/01/2009 and no changes required. 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   + swelling L hand 5 finger Neurologic:  weakness L foot with plantar and dorsiflexion foot and toe R leg normal - slr gait normal and DTRs symmetrical and normal.   Skin:  Intact without suspicious lesions or rashes Psych:  Oriented X3 and normally interactive.     Impression & Recommendations:  Problem # 1:  LOW BACK PAIN, CHRONIC (ICD-724.2)  Her updated medication list for this problem includes:    Flexeril 10 Mg Tabs (Cyclobenzaprine hcl) .Marland Kitchen... 1 by mouth three times a day as needed    Tylenol Extra Strength 500 Mg Tabs (Acetaminophen) .Marland Kitchen... As needed  Discussed use of moist heat or ice, modified activities, medications, and stretching/strengthening exercises. Back care instructions given. To be seen in 2 weeks if no improvement; sooner if worsening of symptoms.   Orders: T-Lumbar Spine w/Flex & Ext 4 Views (04540JW)  Problem # 2:  FATIGUE (ICD-780.79)  Orders:  Venipuncture [phone]) TLB-B12 + Folate Pnl (60454_09811-B14/NWG) TLB-BMP (Basic Metabolic Panel-BMET) (80048-METABOL) TLB-CBC Platelet - w/Differential (85025-CBCD) TLB-Hepatic/Liver Function Pnl (80076-HEPATIC) TLB-TSH (Thyroid  Stimulating Hormone) (84443-TSH) TLB-Uric Acid, Blood (84550-URIC) TLB-Rheumatoid Factor (RA) (95621-HY) TLB-Sedimentation Rate (ESR) (85652-ESR) TLB-T3, Free (Triiodothyronine) (84481-T3FREE) TLB-T4 (Thyrox), Free [phone]) T-Antinuclear Antib (ANA) [phone])  Problem # 3:  PAIN IN JOINT, MULTIPLE SITES (ICD-719.49)  Orders: Venipuncture (40102) TLB-B12 + Folate Pnl (72536_64403-K74/QVZ) TLB-BMP (Basic Metabolic Panel-BMET) (80048-METABOL) TLB-CBC Platelet - w/Differential (85025-CBCD) TLB-Hepatic/Liver Function Pnl (80076-HEPATIC) TLB-TSH (Thyroid Stimulating Hormone) (84443-TSH) TLB-Uric Acid, Blood (84550-URIC) TLB-Rheumatoid Factor (RA) (56387-FI) TLB-Sedimentation Rate (ESR) (85652-ESR) TLB-T3, Free (Triiodothyronine) (84481-T3FREE) TLB-T4 (Thyrox), Free [phone]) T-Antinuclear Antib (ANA) (41660-63016)  Complete Medication List: 1)  Proair Hfa 108 (90 Base) Mcg/act Aers (Albuterol sulfate) .... Use 2 puffs every 4 hours as needed for cough, wheeze or shortness of breath. 2)  Allegra 180 Mg Tabs (Fexofenadine hcl) .Marland Kitchen.. 1 by mouth once daily as needed 3)  Advair Diskus 250-50 Mcg/dose Misc (Fluticasone-salmeterol) .Marland Kitchen.. 1 inh two times a day 4)  Freestyle Lite Strp (Glucose blood) .... Check once daily 5)  Alprazolam 0.25 Mg Tabs (Alprazolam) .Marland Kitchen.. 1 by mouth three times a day as needed 6)  Nasonex 50 Mcg/act Susp (Mometasone furoate) .... Use 2 sprays in each nostril twice daily. 7)  Lidoderm 5 % Ptch (Lidocaine) .Marland Kitchen.. 1 every 12 hours 8)  Flexeril 10 Mg Tabs (Cyclobenzaprine hcl) .Marland Kitchen.. 1 by mouth three times a day as needed 9)  Tylenol Extra Strength 500 Mg Tabs (Acetaminophen) .... As needed 10)  Dexilant 60 Mg Cpdr (Dexlansoprazole) .... Take 1 capsule 30 min prior to breakfast--holding 11)  Align Caps (Probiotic product) .... Take 1 capsule by mouth once a day as needed 12)  Albuterol Sulfate (2.5 Mg/30ml) 0.083% Nebu (Albuterol sulfate) .Marland Kitchen.. 1 via neb qid as  needed 13)  Promethazine Hcl 25 Mg Tabs (Promethazine hcl) .Marland Kitchen.. 1 by mouth qid as needed 14)  Vitamin D3 1000 Unit Caps (Cholecalciferol) .Marland Kitchen.. 1 by mouth daily  Other Orders: T-Hand Left 3 Views (73130TC) Prescriptions: ALLEGRA 180 MG  TABS (FEXOFENADINE HCL) 1 by mouth once daily as needed  #30 Tablet x 11   Entered and Authorized by:   Loreen Freud DO   Signed by:   Loreen Freud DO on 12/27/2009   Method used:   [facility]        [facility] [phone]* (retail)       [address]       [place], [place]  [address]       Ph: [phone]       Fax: [phone]   RxID:   [id number]

## 2010-09-21 NOTE — Progress Notes (Signed)
Summary: lab results   Phone Note Call from Patient Call back at Home Phone [phone]   Caller: Patient Call For: Arlyce Dice Reason for Call: Talk to Nurse, Lab or Test Results Summary of Call: Patient wants lab results  Initial call taken by: Tawni Levy,  September 05, 2009 4:21 PM  Follow-up for Phone Call        Pt. is for a stool H. Pylori Antgen, she will come this week to have it done, Merri Ray CMA will put the order in IDX. Pt. instructed to call back as needed.  Follow-up by: Laureen Ochs LPN,  September 05, 2009 4:31 PM

## 2010-09-21 NOTE — Progress Notes (Signed)
Summary: SOB, cough,wheezing  Phone Note Call from Patient Call back at [facility] Phone [phone]   Summary of Call: pt left VM stating that  her asthma is acting up again and would like for you to rx albuterol for her nebulizer. pt c/o SOB, difficulty breathing, wheezing, cough, chest pain, and a low grade fever 99 . pt states that the pain in her chest is due to all the coughing. pt uses [employer]. pls advise..................Marland KitchenFelecia Deloach CMA  October 10, 2009 10:37 AM   Follow-up for Phone Call        sent to pharmacy but if neb does not work will need to be seen here or UC/ER Follow-up by: Loreen Freud DO,  October 10, 2009 12:02 PM  Additional Follow-up for Phone Call Additional follow up Details #1::        left pt detail message rx sent to pharmacy and if no relief with treatment UC/ED ...............Marland KitchenFelecia Deloach CMA  October 10, 2009 12:26 PM     New/Updated Medications: ALBUTEROL SULFATE (2.5 MG/3ML) 0.083% NEBU (ALBUTEROL SULFATE) 1 via neb qid as needed Prescriptions: ALBUTEROL SULFATE (2.5 MG/3ML) 0.083% NEBU (ALBUTEROL SULFATE) 1 via neb qid as needed  #1 box x 1   Entered and Authorized by:   Loreen Freud DO   Signed by:   Loreen Freud DO on 10/10/2009   Method used:   Electronically to        [facility] [phone]* (retail)       [address]       [place], [place]  [address]       Ph: [phone]       Fax: [phone]   RxID:   [id number]

## 2010-09-21 NOTE — Letter (Signed)
Summary: Generic Letter  [facility] [place]  [address] [place], [place] [address]   Phone: [phone]  Fax: [phone]    05/15/2010  RE: Emily Osborne [address] [place], [place]  [address]  To Whom It May Concern:  The above patient had a allergic reaction to the H1N1 vaccine last year.  She will not be able to get the flu shot this year.            Sincerely,   Loreen Freud, DO

## 2010-09-21 NOTE — Medication Information (Signed)
Summary: Prior Authorization for Fexofenadine/[employer]  Prior Authorization for Fexofenadine/[employer]   Imported By: Lanelle Bal 10/18/2009 10:07:21  _____________________________________________________________________  External Attachment:    Type:   Image     Comment:   External Document

## 2010-09-21 NOTE — Progress Notes (Signed)
Summary: prior auth//samples   Phone Note Call from Patient Call back at Home Phone [phone]   Caller: Patient Call For: Dr Arlyce Dice Reason for Call: Refill Medication, Talk to Nurse Summary of Call: Patient needs prior auth to get her Dexilant, patient needs it asap because she's completely out. Would like some samples until she gets rx. Initial call taken by: Tawni Levy,  September 14, 2010 11:48 AM  Follow-up for Phone Call        Expalined to pt that we have not revieved a prior request yet from insurance. Pt to contact her insurance and give them the correct fax number. Also I am leaving samples of Dexilant for pt for her to pick up today Follow-up by: Merri Ray CMA Duncan Dull),  September 14, 2010 1:31 PM     Appended Document: prior auth//samples I called [employer] and they approved the Dexilant until 09-15-2011.  I called the pt to advise.  Merri Ray CMA left samples for the pt yesterday 09-14-2010.  She thanked [place] for our help. I called [facility] to advise. Case # [id number].

## 2010-09-21 NOTE — Progress Notes (Signed)
Summary: Lab Results  Phone Note Outgoing Call   Call placed by: Army Fossa CMA,  Jan 02, 2010 1:12 PM Reason for Call: Discuss lab or test results Summary of Call: Regarding lab results, LMTCB:  Essentially labs are normal-----b12 is low normal-----can try nasocobal 1 spray each nostril 1x a week  ( give coupon)   or she can come in for B12 injections weekly x4 and then 1x a month-----either way check B12 1 month Signed by Loreen Freud DO on 12/31/2009 at 11:23 AM  Follow-up for Phone Call        pt called left msg returning Danielle call; Called pt back and got VM; left msg for pt to call nurse .Asyah Candler  Jan 03, 2010 4:31 PM   Additional Follow-up for Phone Call Additional follow up Details #1::        lmtcb. Army Fossa CMA  Jan 04, 2010 12:07 PM     Additional Follow-up for Phone Call Additional follow up Details #2::    lmtcb. Army Fossa CMA  Jan 05, 2010 11:16 AM   Additional Follow-up for Phone Call Additional follow up Details #3:: Details for Additional Follow-up Action Taken: Pt is aware, she would like to try the nascobal. Will put coupon upfront. Army Fossa CMA  Jan 06, 2010 10:59 AM   New/Updated Medications: NASCOBAL 500 MCG/0.1ML SOLN (CYANOCOBALAMIN) 1 spray in 1 nostril once weekly. NASCOBAL 500 MCG/0.1ML SOLN (CYANOCOBALAMIN) 1 spray in each nostril once weekly. Prescriptions: NASCOBAL 500 MCG/0.1ML SOLN (CYANOCOBALAMIN) 1 spray in each nostril once weekly.  #1 x 0   Entered by:   Army Fossa CMA   Authorized by:   Loreen Freud DO   Signed by:   Army Fossa CMA on 01/06/2010   Method used:   Electronically to        [facility] [phone]* (retail)       [address]       [place], [place]  [address]       Ph: [phone]       Fax: [phone]   RxID:   [id number] NASCOBAL 500 MCG/0.1ML SOLN (CYANOCOBALAMIN) 1 spray in 1 nostril once weekly.  #1 x 0   Entered by:   Army Fossa CMA   Authorized by:   Loreen Freud DO   Signed by:   Army Fossa CMA on 01/06/2010   Method used:   Electronically to        [facility] [phone]* (retail)       [address]       [place], [place]  [address]       Ph: [phone]       Fax: [phone]   RxID:   [id number]

## 2010-09-21 NOTE — Progress Notes (Signed)
Summary: PT REQESTS PHENERGAN  Phone Note Call from Patient Call back at Home Phone [phone]   Caller: Patient Call For: Loreen Freud DO Complaint: Nausea/Vomiting/Diarrhea Summary of Call: PATIENT CALLING, HAS AN APPT TO SEE DR. Laury Axon TOMORROW, 11-23-09, FOR A YEAST INFECTION, BUT PATIENT STATES SHE IS VERY NAUSEOUS, AND IS REQUESTING THAT DR. Laury Axon CALL IN PHENERGEN FOR HER TODAY TO [facility] [address]  CALL PATIENT AFTER 12 NOON ONLY PLEASE. Initial call taken by: Magdalen Spatz [facility],  November 21, 2009 8:25 AM  Follow-up for Phone Call        phenergan 25 mg  # 30 diflucan also sent---- keep appointment for tomorrow Follow-up by: Loreen Freud DO,  November 21, 2009 10:36 AM  Additional Follow-up for Phone Call Additional follow up Details #1::        Left message that we sent in meds. Army Fossa CMA  November 21, 2009 1:30 PM     New/Updated Medications: PROMETHAZINE HCL 25 MG TABS (PROMETHAZINE HCL) 1 by mouth qid as needed FLUCONAZOLE 150 MG TABS (FLUCONAZOLE) 1 by mouth once daily x1 --may repeat in 3 days as needed Prescriptions: FLUCONAZOLE 150 MG TABS (FLUCONAZOLE) 1 by mouth once daily x1 --may repeat in 3 days as needed  #2 x 2   Entered and Authorized by:   Loreen Freud DO   Signed by:   Loreen Freud DO on 11/21/2009   Method used:   Electronically to        [facility] [phone]* (retail)       [address]       [place], [place]  [address]       Ph: [phone]       Fax: [phone]   RxID:   [id number] PROMETHAZINE HCL 25 MG TABS (PROMETHAZINE HCL) 1 by mouth qid as needed  #30 x 0   Entered and Authorized by:   Loreen Freud DO   Signed by:   Loreen Freud DO on 11/21/2009   Method used:   Electronically to        [facility] [phone]* (retail)       [address]       [place], [place]  [address]       Ph: [phone]       Fax: [phone]   RxID:   [id number]

## 2010-09-21 NOTE — Medication Information (Signed)
Summary: Approval for Fexofenadine/[facility]  Approval for Fexofenadine/[facility]   Imported By: Lanelle Bal 10/17/2009 08:20:33  _____________________________________________________________________  External Attachment:    Type:   Image     Comment:   External Document

## 2010-09-21 NOTE — Progress Notes (Signed)
Summary: Refill Request  Phone Note Refill Request Message from:  Pharmacy on [facility] [address] Fax # [phone]  Refills Requested: Medication #1:  Fexofenadine 180mg  tab take 1/2-1 tablets once dailey as needed   Dosage confirmed as above?Dosage Confirmed Initial call taken by: Harold Barban,  September 23, 2009 11:16 AM    Prescriptions: ALLEGRA 180 MG  TABS (FEXOFENADINE HCL) 1 by mouth once daily as needed  #30 Tablet x 2   Entered by:   Army Fossa CMA   Authorized by:   Loreen Freud DO   Signed by:   Army Fossa CMA on 09/23/2009   Method used:   Electronically to        [facility] [address]* (retail)       [address]       [place], [place]  [address]       Ph: [phone] or [phone]       Fax: [phone]   RxID:   [id number]   Appended Document: Refill Request Patient went to pick up rx and it was not there.  I called and gave the pharmacist the rx for Allegra.

## 2010-09-21 NOTE — Assessment & Plan Note (Signed)
Summary: YEAST INFECTION/RH......   Vital Signs:  Patient profile:   50 year old female Weight:      213 pounds Pulse rate:   82 / minute Pulse rhythm:   regular BP sitting:   110 / 70  (left arm) Cuff size:   large  Vitals Entered By: Army Fossa CMA (November 22, 2009 2:07 PM) CC: Pt here for possible yeast infection, has been vomitting and nauseas.   History of Present Illness: Pt here c/o vaginal yeast infection and odor.  She took diflucan yesterday and does feel a little better.  symptoms x 2 weeks.  Pt has also had N/V on and off for 2 weeks.  That started after eating cereal with milk and then again after milk shake.     Allergies: 1)  ! Septra 2)  ! Asa 3)  ! Ibuprofen 4)  ! Doxycycline 5)  ! Ultram 6)  ! Gentamicin in Saline (Gentamicin in Saline) 7)  ! Zantac 8)  ! * Periactin 9)  ! Influenza A (H1n1) Monoval Vac (Influenza A (H1n1) Monoval Vac)  Past History:  Past medical, surgical, family and social histories (including risk factors) reviewed for relevance to current acute and chronic problems.  Past Medical History: Reviewed history from 08/03/2009 and no changes required. Asthma Fibromyalgia Irritable Bowel Syndrome GERD Current Problems:  GERD (ICD-530.81) HELICOBACTER PYLORI GASTRITIS, HX OF (ICD-V12.79) OTHER ACUTE REACTIONS TO STRESS (ICD-308.3) ELBOW PAIN, RIGHT (ICD-719.42) SHOULDER PAIN, RIGHT (ICD-719.41) ELEVATED BLOOD PRESSURE (ICD-796.2) DEPENDENT EDEMA, RIGHT LEG (ICD-782.3) CALF PAIN, RIGHT (ICD-729.5) SHINGLES (ICD-053.9) PREVENTIVE HEALTH CARE (ICD-V70.0) FAMILY HISTORY OF CAD FEMALE 1ST DEGREE RELATIVE <50 (ICD-V17.3) RUPTURE, ACHILLES TENDON (ICD-727.67) BUNIONECTOMY, HX OF (ICD-V15.89) GANGLION CYST, WRIST, RIGHT (ICD-727.41) ASTHMA (ICD-493.90) Hyperlipidemia  Past Surgical History: Reviewed history from 06/08/2008 and no changes required. Hysterectomy (2000) Ganglian csyt Foot surgery--- bunionectomy 1983 Tubal  ligation achilles tendon rupture--2005  Family History: Reviewed history from 08/03/2009 and no changes required. Family History of CAD Female 1st degree relative <50 Family History High cholesterol Family History Hypertension Family History Kidney disease M-- AAA repair. HTN Family History Diabetes 1st degree relative--  F fam hx GB disease No FH of Colon Cancer:  Social History: Reviewed history from 09/01/2009 and no changes required. 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 Genitalia:  normal introitus, no external lesions, and vaginal discharge.   Psych:  Oriented X3 and normally interactive.     Impression & Recommendations:  Problem # 1:  VAGINITIS (ICD-616.10)  Her updated medication list for this problem includes:    Metrogel-vaginal 0.75 % Gel (Metronidazole) .Marland Kitchen... 1 applicator at bedtime for 5 nights    Diflucan  Discussed symptomatic relief and treatment options.   Orders: Wet Prep ([id number])  Problem # 2:  NAUSEA (ICD-787.02)  ? if its from dairy products avoid dairy take dexilant daily f/u GI if no better Her updated medication list for this problem includes:    Allegra 180 Mg Tabs (Fexofenadine hcl) .Marland Kitchen... 1 by mouth once daily as needed    Promethazine Hcl 25 Mg Tabs (Promethazine hcl) .Marland Kitchen... 1 by mouth qid as needed  Orders: Wet Prep ([id number])  Complete Medication List: 1)  Proair Hfa 108 (90 Base) Mcg/act  Aers (Albuterol sulfate) .... Use 2 puffs every 4 hours as needed for cough, wheeze or shortness of breath. 2)  Allegra 180 Mg Tabs (Fexofenadine hcl) .Marland Kitchen.. 1 by mouth once daily as needed 3)  Advair Diskus  250-50 Mcg/dose Misc (Fluticasone-salmeterol) .Marland Kitchen.. 1 inh two times a day 4)  Freestyle Lite Strp (Glucose blood) .... Check once daily 5)  Alprazolam 0.25 Mg Tabs (Alprazolam) .Marland Kitchen.. 1 by mouth three times a day as needed 6)  Nasonex 50 Mcg/act Susp (Mometasone furoate) .... Use 2 sprays in each nostril twice daily. 7)  Lidoderm 5 % Ptch (Lidocaine) .Marland Kitchen.. 1 every 12 hours 8)  Vitamin D 16109 Unit Caps (Ergocalciferol) .... Take 1 by mouth weekly 9)  Flexeril 10 Mg Tabs (Cyclobenzaprine hcl) .Marland Kitchen.. 1 by mouth three times a day as needed 10)  Tylenol Extra Strength 500 Mg Tabs (Acetaminophen) .... As needed 11)  Dexilant 60 Mg Cpdr (Dexlansoprazole) .... Take 1 capsule 30 min prior to breakfast--holding 12)  Align Caps (Probiotic product) .... Take 1 capsule by mouth once a day as needed 13)  Albuterol Sulfate (2.5 Mg/27ml) 0.083% Nebu (Albuterol sulfate) .Marland Kitchen.. 1 via neb qid as needed 14)  Promethazine Hcl 25 Mg Tabs (Promethazine hcl) .Marland Kitchen.. 1 by mouth qid as needed 15)  Fluconazole 150 Mg Tabs (Fluconazole) .Marland Kitchen.. 1 by mouth once daily x1 --may repeat in 3 days as needed 16)  Metrogel-vaginal 0.75 % Gel (Metronidazole) .Marland Kitchen.. 1 applicator at bedtime for 5 nights Prescriptions: METROGEL-VAGINAL 0.75 % GEL (METRONIDAZOLE) 1 applicator at bedtime for 5 nights  #5 x 0   Entered and Authorized by:   Loreen Freud DO   Signed by:   Loreen Freud DO on 11/22/2009   Method used:   Electronically to        [facility] [phone]* (retail)       [address]       [place], [place]  [address]       Ph: [phone]       Fax: [phone]   RxID:   [id number]   Laboratory Results   Urine Tests    Routine Urinalysis   Color: yellow Appearance: Clear Glucose: negative   (Normal Range: Negative) Bilirubin: negative   (Normal Range: Negative) Ketone: negative   (Normal Range: Negative) Spec. Gravity: 1.015   (Normal Range: 1.003-1.035) Blood: negative   (Normal Range:  Negative) pH: 6.0   (Normal Range: 5.0-8.0) Protein: negative   (Normal Range: Negative) Urobilinogen: 0.2   (Normal Range: 0-1) Nitrite: negative   (Normal Range: Negative) Leukocyte Esterace: negative   (Normal Range: Negative)    Comments: Army Fossa CMA  November 22, 2009 2:19 PM    Wet [place] Source: vaginal Bacteria/hpf: 2+ Clue cells/hpf: moderate  Positive whiff Yeast/hpf: few Trichomonas/hpf: none     November 22, 2009   Ryn Soller [address] [place], [place] [address]  RE:  LAB RESULTS  Dear  Ms. OO,  The following is an interpretation of your most recent lab tests.  Please take note of any instructions provided or changes to medications that have resulted from your lab work.      Medications Prescribed or Changed METROGEL-VAGINAL 0.75 % GEL (METRONIDAZOLE) 1 applicator at bedtime for 5 nights   Medications Discontinued  GAS-X EXTRA STRENGTH 125 MG CHEW (SIMETHICONE) one tablet by mouth in the morning and one tablet by mouth at bedtime

## 2010-09-21 NOTE — Letter (Signed)
Summary: Pre Visit Letter Revised  [facility]  [address] [place], [place] [address]   Phone: [phone]  Fax: [phone]        09/07/2010 MRN: [id number] [facility] [address], [place]  [address]             Procedure Date:  October 06, 2010   dir col-Dr Nedra Hai to the Gastroenterology Division at [facility].    You are scheduled to see a nurse for your pre-procedure visit on September 22, 2010 at 9:00am on the 3rd floor at [facility], [address]  We ask that you try to arrive at our office 15 minutes prior to your appointment time to allow for check-in.  Please take a minute to review the attached form.  If you answer "Yes" to one or more of the questions on the first page, we ask that you call the person listed at your earliest opportunity.  If you answer "No" to all of the questions, please complete the rest of the form and bring it to your appointment.    Your nurse visit will consist of discussing your medical and surgical history, your immediate family medical history, and your medications.   If you are unable to list all of your medications on the form, please bring the medication bottles to your appointment and we will list them.  We will need to be aware of both prescribed and over the counter drugs.  We will need to know exact dosage information as well.    Please be prepared to read and sign documents such as consent forms, a financial agreement, and acknowledgement forms.  If necessary, and with your consent, a friend or relative is welcome to sit-in on the nurse visit with you.  Please bring your insurance card so that we may make a copy of it.  If your insurance requires a referral to see a specialist, please bring your referral form from your primary care physician.  No co-pay is required for this nurse visit.     If you cannot keep your appointment, please call [phone] to cancel or reschedule  prior to your appointment date.  This allows [place] the opportunity to schedule an appointment for another patient in need of care.    Thank you for choosing [facility] for your medical needs.  We appreciate the opportunity to care for you.  Please visit [place] at our website  to learn more about our practice.  Sincerely, The Gastroenterology Division

## 2010-09-21 NOTE — Progress Notes (Signed)
Summary: Valtrex   Phone Note Call from Patient   Summary of Call: Patient left message on triage that she has a fever blister around her eye and would like a refill of Valtrex. Please advise.  Initial call taken by: Lucious Groves CMA,  April 07, 2010 10:04 AM  Follow-up for Phone Call        ok to refill but if sore is close to eye or moves towards eye she needs eye appointment asap. Follow-up by: Loreen Freud DO,  April 07, 2010 12:57 PM  Additional Follow-up for Phone Call Additional follow up Details #1::        Left message for pt to calll back. Army Fossa CMA  April 07, 2010 4:18 PM     Additional Follow-up for Phone Call Additional follow up Details #2::    I spoke with pt and she is aware. Army Fossa CMA  April 07, 2010 4:23 PM   New/Updated Medications: VALTREX 1 GM TABS (VALACYCLOVIR HCL) 1 by mouth three times a day Prescriptions: VALTREX 1 GM TABS (VALACYCLOVIR HCL) 1 by mouth three times a day  #30 x 2   Entered and Authorized by:   Loreen Freud DO   Signed by:   Loreen Freud DO on 04/07/2010   Method used:   [facility] [phone][facility] (retail)       [address]       [place], [place]  [address]       Ph: [phone]       Fax: [phone]   RxID:   [id number]

## 2010-09-21 NOTE — Progress Notes (Signed)
Summary: Refill Request  Phone Note Refill Request Message from:  Patient on September 11, 2010 1:43 PM  Refills Requested: Medication #1:  LIDODERM 5 % PTCH 1 every 12 hours   Dosage confirmed as above?Dosage Confirmed   [facility] Requested[facility] [address]  Next Appointment Scheduled: none Initial call taken by: Harold Barban,  September 11, 2010 1:43 PM  Follow-up for Phone Call        last filled 06/15/09 and seen 09/07/10.Marland Kitchenplease advise Follow-up by: Almeta Monas CMA Duncan Dull),  September 11, 2010 5:04 PM  Additional Follow-up for Phone Call Additional follow up Details #1::        refill x1 Additional Follow-up by: Loreen Freud DO,  September 11, 2010 9:38 PM    Prescriptions: LIDODERM 5 % PTCH (LIDOCAINE) 1 every 12 hours  #30 x 0   Entered by:   Almeta Monas CMA (AAMA)   Authorized by:   Loreen Freud DO   Signed by:   Almeta Monas CMA (AAMA) on 09/12/2010   Method used:   Faxed to ...       [facility] [phone]* (retail)       [address]       [place], [place]  [address]       Ph: [phone]       Fax: [phone]   RxID:   [id number]

## 2010-09-21 NOTE — Progress Notes (Signed)
Summary: metrogel not helping  Phone Note Call from Patient Call back at Home Phone [phone]   Caller: Patient Summary of Call:   pt called was seen tue 11/22/09 given rx for metro gel, and not helping seem to be hurting more in the vaginal area  Initial call taken by: Kandice Hams,  November 24, 2009 3:16 PM  Follow-up for Phone Call        flagyl 500 two times a day for 7 days  Follow-up by: Loreen Freud DO,  November 24, 2009 3:26 PM  Additional Follow-up for Phone Call Additional follow up Details #1::        rx sent to  [facility] left msg for pt .Keyanni Whittinghill  November 24, 2009 4:28 PM  Additional Follow-up by: Kandice Hams,  November 24, 2009 4:28 PM    New/Updated Medications: FLAGYL 500 MG TABS (METRONIDAZOLE) 1 by mouth two times a day for 7 days Prescriptions: FLAGYL 500 MG TABS (METRONIDAZOLE) 1 by mouth two times a day for 7 days  #14 x 0   Entered by:   Kandice Hams   Authorized by:   Loreen Freud DO   Signed by:   Kandice Hams on 11/24/2009   Method used:   Faxed to ...       [facility] [phone]* (retail)       [address]       [place], [place]  [address]       Ph: [phone]       Fax: [phone]   RxID:   [id number]

## 2010-09-21 NOTE — Progress Notes (Signed)
Summary: X-ray Results  Phone Note Outgoing Call   Call placed by: Army Fossa CMA,  Dec 28, 2009 8:07 AM Summary of Call: Results, LMTCB:  Lumbar X-ray: + disc space narrowing---may need mri if symptoms do not improved Signed by Loreen Freud DO on 12/27/2009 at 5:11 PM  Hand X-ray: Normal   Follow-up for Phone Call        DISCUSS WITH PATIENT.......Marland KitchenFelecia Deloach CMA  Dec 28, 2009 9:02 AM

## 2010-09-21 NOTE — Letter (Signed)
Summary: Appt Reminder 2  [facility]  [address] [place], [place] [address]   Phone: [phone]  Fax: [phone]        September 22, 2009 MRN: [id number]    [facility] [address] [place], [place]  [address]    Dear Ms. STREETS,   You have a return appointment with Dr.Robert Arlyce Dice on 10-17-09 at 3pm. Please remember to bring a complete list of the medicines you are taking, your insurance card and your co-pay.  If you have to cancel or reschedule this appointment, please call before 5:00 pm the evening before to avoid a cancellation fee.  If you have any questions or concerns, please call [phone].    Sincerely,    Laureen Ochs LPN  Appended Document: Appt Reminder 2 Letter mailed to patient.

## 2010-09-21 NOTE — Progress Notes (Signed)
Summary: Prior Auth APPROVED Fexofenadine [employer]  Phone Note Refill Request Message from:  Pharmacy on [address]. Fax #: [phone]  Refills Requested: Medication #1:  Fexofenadine 180mg  tab, take 1 tabley once daily as needed   Dosage confirmed as above?Dosage Confirmed Prior Auth [id number]  Initial call taken by: Harold Barban,  October 06, 2009 8:55 AM  Follow-up for Phone Call        prior auth in process awaiting fax..............Marland KitchenFelecia Deloach CMA  October 06, 2009 12:29 PM left message to call office to see if pt has tried any OTC meds.....................Marland KitchenFelecia Deloach CMA  October 06, 2009 3:51 PM   left message to call  office...............Marland KitchenFelecia Deloach CMA  October 10, 2009 9:12 AM    Additional Follow-up for Phone Call Additional follow up Details #1::        prior auth faxed back awaiting response..........Marland KitchenFelecia Deloach CMA  October 10, 2009 10:42 AM     Additional Follow-up for Phone Call Additional follow up Details #2::    PRIOR AUTH APPROVED 09-19-09 UNTIL 10-10-10...............Marland KitchenFelecia Deloach CMA  October 13, 2009 9:23 AM

## 2010-09-21 NOTE — Progress Notes (Signed)
Summary: Triage-GES and REV Scheduled   Phone Note Call from Patient Call back at Home Phone [phone]   Caller: Patient Call For: Dr. Arlyce Dice Reason for Call: Talk to Nurse Summary of Call: Pt is calling about test results. She is having abdominal pain Initial call taken by: Karna Christmas,  September 16, 2009 12:15 PM  Follow-up for Phone Call        (Stool results won't be ready until next week, per [facility]) Message left for patient to callback. Laureen Ochs LPN  September 16, 2009 3:08 PM  Message left for patient to callback. Laureen Ochs LPN  September 16, 2009 4:55 PM   Additional Follow-up for Phone Call Additional follow up Details #1::        stool studies are negative. needs GES Additional Follow-up by: Louis Meckel MD,  September 19, 2009 8:52 AM    Additional Follow-up for Phone Call Additional follow up Details #2::    Message left for patient to callback.Laureen Ochs LPN  September 19, 2009 9:35 AM Pt. called back and left a message for me to call back at [phone]. Message left for patient to callback on 09-21-09. Laureen Ochs LPN  September 19, 2009 5:06 PM  Message left for patient: Stool studies are negative. Please callback to schedule a GES. Laureen Ochs LPN  September 21, 2009 8:12AM         Additional Follow-up for Phone Call Additional follow up Details #3:: Details for Additional Follow-up Action Taken: Pt. scheduled her GES at [facility] on 10-06-09 at 11am-NPO 8 hours prior. Follow-up OV with Dr.Kaplan is scheduled for 10-17-09 at 3pm. All instructions reviewed with pt. by phone and mailed to her. Pt. instructed to call back as needed.  Additional Follow-up by: Laureen Ochs LPN,  September 22, 2009 9:26 AM

## 2010-09-21 NOTE — Progress Notes (Signed)
Summary: Triage   Phone Note Call from Patient Call back at Home Phone [phone]   Caller: Patient Call For: Dr. Arlyce Dice Reason for Call: Talk to Nurse Summary of Call: Pt is scheduled on 09-19-09 and is having alot of bloating and abdominal pain. Wants to know if anyone can see her any earlier Initial call taken by: Karna Christmas,  August 31, 2009 10:29 AM  Follow-up for Phone Call        Pt. rescheduled her appt. yesterday due to bad weather, but wants to be seen ASAP. She will see Dr.Rihaan Barrack on 09-01-09 at 9:45am.  Follow-up by: Laureen Ochs LPN,  August 31, 2009 11:04 AM

## 2010-09-21 NOTE — Progress Notes (Signed)
Summary: Request for Pain Meds(lmom 6/16)  Phone Note Call from Patient Call back at Home Phone [phone]   Caller: Patient Call For: Loreen Freud DO Summary of Call: Patient is having really bad mouth pain and wants hydrocodone called in to the local [facility].  Patient is out of town and can not come in.  She also states she can not take NSAIDS or Aspirin.  Initial call taken by: Barnie Mort,  February 01, 2010 4:16 PM  Follow-up for Phone Call        I can't call in pain meds for something I have not seen ---- if she can not come to office she needs to go to UC. Follow-up by: Loreen Freud DO,  February 01, 2010 9:52 PM  Additional Follow-up for Phone Call Additional follow up Details #1::        left message for pt to call back. Army Fossa CMA  February 02, 2010 9:21 AM  patient left msg on triage vm that she has appt to see dentist emgently while out of town called patient back left detailed msg w/ Dr. Ernst Spell recommendations.....Marland KitchenMarland KitchenDoristine Devoid  February 03, 2010 8:21 AM

## 2010-09-22 ENCOUNTER — Ambulatory Visit: Admit: 2010-09-22 | Payer: Self-pay | Admitting: Gastroenterology

## 2010-09-27 NOTE — Miscellaneous (Signed)
Summary: [facility] Previsit/prep  Clinical Lists Changes  Medications: Added new medication of MOVIPREP 100 GM  SOLR (PEG-KCL-NACL-NASULF-NA ASC-C) As per prep instructions. - Signed Rx of MOVIPREP 100 GM  SOLR (PEG-KCL-NACL-NASULF-NA ASC-C) As per prep instructions.;  #1 x 0;  Signed;  Entered by: Wyona Almas RN;  Authorized by: Louis Meckel MD;  Method used: [facility] to [facility] [phone]*, [address], [place], [place], [place]  [address], Ph: [phone], Fax: [phone] Allergies: Changed allergy or adverse reaction from ZANTAC to ZANTAC Changed allergy or adverse reaction from [facility] to [facility] Changed allergy or adverse reaction from ASA to ASA Added new allergy or adverse reaction of NSAIDS Changed allergy or adverse reaction from DOXYCYCLINE to DOXYCYCLINE Changed allergy or adverse reaction from * PERIACTIN to * PERIACTIN Changed allergy or adverse reaction from INFLUENZA A (H1N1) MONOVAL VAC (INFLUENZA A (H1N1) MONOVAL VAC) to INFLUENZA A (H1N1) MONOVAL VAC (INFLUENZA A (H1N1) MONOVAL VAC) Removed allergy or adverse reaction of * FLUCONAZOLE Added new allergy or adverse reaction of FLAGYL    Prescriptions: MOVIPREP 100 GM  SOLR (PEG-KCL-NACL-NASULF-NA ASC-C) As per prep instructions.  #1 x 0   Entered by:   Wyona Almas RN   Authorized by:   Louis Meckel MD   Signed by:   Wyona Almas RN on 09/21/2010   Method used:   Electronically to        [facility] [phone]* (retail)       [address]       [place], [place]  [address]       Ph: [phone]       Fax: [phone]   RxID:   [id number]

## 2010-09-27 NOTE — Letter (Signed)
Summary: [facility] Instructions  [facility]  [address] [place], [place] [address]   Phone: [phone]  Fax: [phone]       Emily Osborne    [birth year]    MRN: [id number]        Procedure Day Dorna Bloom:  Lenor Coffin  10/05/10     Arrival Time:  1:00PM      Procedure Time:  2:00PM     Location of Procedure:                    _X_  [facility] (4th Floor)                      PREPARATION FOR COLONOSCOPY WITH MOVIPREP   Starting 5 days prior to your procedure 09/30/10 do not eat nuts, seeds, popcorn, corn, beans, peas,  salads, or any raw vegetables.  Do not take any fiber supplements (e.g. Metamucil, Citrucel, and Benefiber).  THE DAY BEFORE YOUR PROCEDURE         DATE: 10/04/10   DAY: WEDNESDAY  1.  Drink clear liquids the entire day-NO SOLID FOOD  2.  Do not drink anything colored red or purple.  Avoid juices with pulp.  No orange juice.  3.  Drink at least 64 oz. (8 glasses) of fluid/clear liquids during the day to prevent dehydration and help the prep work efficiently.  CLEAR LIQUIDS INCLUDE: Water Jello Ice Popsicles Tea (sugar ok, no milk/cream) Powdered fruit flavored drinks Coffee (sugar ok, no milk/cream) Gatorade Juice: apple, white grape, white cranberry  Lemonade Clear bullion, consomm, broth Carbonated beverages (any kind) Strained chicken noodle soup Hard Candy                             4.  In the morning, mix first dose of MoviPrep solution:    Empty 1 Pouch A and 1 Pouch B into the disposable container    Add lukewarm drinking water to the top line of the container. Mix to dissolve    Refrigerate (mixed solution should be used within 24 hrs)  5.  Begin drinking the prep at 5:00 p.m. The MoviPrep container is divided by 4 marks.   Every 15 minutes drink the solution down to the next mark (approximately 8 oz) until the full liter is complete.   6.  Follow completed prep with 16 oz of clear liquid of your choice  (Nothing red or purple).  Continue to drink clear liquids until bedtime.  7.  Before going to bed, mix second dose of MoviPrep solution:    Empty 1 Pouch A and 1 Pouch B into the disposable container    Add lukewarm drinking water to the top line of the container. Mix to dissolve    Refrigerate  THE DAY OF YOUR PROCEDURE      DATE: 10/05/10   DAY: THURSDAY  Beginning at 9:00AM (5 hours before procedure):         1. Every 15 minutes, drink the solution down to the next mark (approx 8 oz) until the full liter is complete.  2. Follow completed prep with 16 oz. of clear liquid of your choice.    3. You may drink clear liquids until 12:00PM (2 HOURS BEFORE PROCEDURE).   MEDICATION INSTRUCTIONS  Unless otherwise instructed, you should take regular prescription medications with a small sip of water   as early as possible the morning  of your procedure.         OTHER INSTRUCTIONS  You will need a responsible adult at least 51 years of age to accompany you and drive you home.   This person must remain in the waiting room during your procedure.  Wear loose fitting clothing that is easily removed.  Leave jewelry and other valuables at home.  However, you may wish to bring a book to read or  an iPod/MP3 player to listen to music as you wait for your procedure to start.  Remove all body piercing jewelry and leave at home.  Total time from sign-in until discharge is approximately 2-3 hours.  You should go home directly after your procedure and rest.  You can resume normal activities the  day after your procedure.  The day of your procedure you should not:   Drive   Make legal decisions   Operate machinery   Drink alcohol   Return to work  You will receive specific instructions about eating, activities and medications before you leave.    The above instructions have been reviewed and explained to me by   Wyona Almas RN  September 21, 2010 10:01 AM     I fully  understand and can verbalize these instructions _____________________________ Date _________

## 2010-09-27 NOTE — Medication Information (Signed)
Summary: Dexilant approved/[employer]  Dexilant approved/[employer]   Imported By: Sherian Rein 09/21/2010 09:37:02  _____________________________________________________________________  External Attachment:    Type:   Image     Comment:   External Document

## 2010-10-05 ENCOUNTER — Other Ambulatory Visit (AMBULATORY_SURGERY_CENTER): Payer: BC Managed Care – PPO | Admitting: Gastroenterology

## 2010-10-05 ENCOUNTER — Encounter: Payer: Self-pay | Admitting: Gastroenterology

## 2010-10-05 DIAGNOSIS — K5730 Diverticulosis of large intestine without perforation or abscess without bleeding: Secondary | ICD-10-CM

## 2010-10-05 DIAGNOSIS — D126 Benign neoplasm of colon, unspecified: Secondary | ICD-10-CM

## 2010-10-05 DIAGNOSIS — K620 Anal polyp: Secondary | ICD-10-CM

## 2010-10-05 DIAGNOSIS — Z1211 Encounter for screening for malignant neoplasm of colon: Secondary | ICD-10-CM

## 2010-10-06 ENCOUNTER — Other Ambulatory Visit: Payer: Self-pay | Admitting: Gastroenterology

## 2010-10-11 NOTE — Procedures (Addendum)
Summary: Colonoscopy  Patient: [name] Note: All result statuses are Final unless otherwise noted.  Tests: (1) Colonoscopy (COL)   COL Colonoscopy           DONE     [facility]     [address]     [place], [place]  [address]          COLONOSCOPY PROCEDURE REPORT          PATIENT:  Emily Osborne, Emily Osborne  MR#:  [id number]     BIRTHDATE:  01-Apr-1961, 49 yrs. old  GENDER:  female          ENDOSCOPIST:  Barbette Hair. Arlyce Dice, MD     Referred by:          PROCEDURE DATE:  10/05/2010     PROCEDURE:  Colonoscopy with snare polypectomy     ASA CLASS:  Class II     INDICATIONS:  1) Routine Risk Screening          MEDICATIONS:   Fentanyl 100 mcg IV, Versed 10 mg IV          DESCRIPTION OF PROCEDURE:   After the risks benefits and     alternatives of the procedure were thoroughly explained, informed     consent was obtained.  Digital rectal exam was performed and     revealed no abnormalities.   The LB 180AL [id number] endoscope was     introduced through the anus and advanced to the cecum, which was     identified by both the appendix and ileocecal valve, without     limitations.  The quality of the prep was Moviprep fair.  The     instrument was then slowly withdrawn as the colon was fully     examined.<<PROCEDUREIMAGES>>          FINDINGS:  A diminutive polyp was found in the descending colon.     It was 3 mm in size. Polyp was snared without cautery. Retrieval     was unsuccessful (see image10). snare polyp  A diminutive polyp     was found in the rectum. It was 2 mm in size. It was found 2 cm     from the point of entry. Polyp was snared without cautery.     Retrieval was unsuccessful. snare polyp  Scattered diverticula     were found (see image3). sigmoid to descending colon  This was     otherwise a normal examination of the colon (see image4, image5,     image7, image9, and image12).   Retroflexed views in the rectum     revealed no abnormalities.    The time to  cecum =  2.25  minutes.     The scope was then withdrawn (time =  16.50  min) from the patient     and the procedure completed.          COMPLICATIONS:  None          ENDOSCOPIC IMPRESSION:     1) 3 mm diminutive polyp in the descending colon     2) 2 mm diminutive polyp in the rectum     3) Diverticula, scattered     4) Otherwise normal examination     RECOMMENDATIONS:          1) Follow up colonoscopy in 5 years (diminutive polyps lost in     liquid stool)          REPEAT EXAM:  5 year(s) Colonoscopy          ______________________________     Barbette Hair. Arlyce Dice, MD          CC:  Lelon Perla, DO          n.     eSIGNED:   Barbette Hair. Misaki Sozio at 10/05/2010 02:52 PM          Dalbert Batman, [id number]  Note: An exclamation mark (!) indicates a result that was not dispersed into the flowsheet. Document Creation Date: 10/05/2010 2:52 PM _______________________________________________________________________  (1) Order result status: Final Collection or observation date-time: 10/05/2010 14:39 Requested date-time:  Receipt date-time:  Reported date-time:  Referring Physician:   Ordering Physician: Melvia Heaps [phone]) Specimen Source:  Source: Launa Grill Order Number: [id number] Lab site:

## 2010-10-19 ENCOUNTER — Ambulatory Visit: Payer: BC Managed Care – PPO | Admitting: Family Medicine

## 2010-10-20 ENCOUNTER — Encounter: Payer: Self-pay | Admitting: Family Medicine

## 2010-10-20 ENCOUNTER — Ambulatory Visit (INDEPENDENT_AMBULATORY_CARE_PROVIDER_SITE_OTHER): Payer: BC Managed Care – PPO | Admitting: Family Medicine

## 2010-10-20 DIAGNOSIS — M25519 Pain in unspecified shoulder: Secondary | ICD-10-CM

## 2010-10-26 NOTE — Assessment & Plan Note (Signed)
Summary: shoulder pain.cbs   Vital Signs:  Patient profile:   50 year old female Menstrual status:  hysterectomy Weight:      220 pounds BMI:     36.18 Pulse rate:   78 / minute BP sitting:   118 / 84  (left arm)  Vitals Entered By: Doristine Devoid CMA (October 20, 2010 2:47 PM) CC: R shoulder pain x2 weeks worse w/ rotation    History of Present Illness:  Injury      This is a 50 year old woman who presents with An injury.  The symptoms began 2 weeks ago.  The patient reports injury to the right arm and shoulder,  but denies injury to the head, face, neck, left arm, left elbow, right elbow, left forearm, right forearm, chest, back, abdomen, left hip, right hip, left thigh, right thigh, left knee, right knee, left leg, right leg, left ankle, right ankle, left foot, and right foot.  The patient also reports tenderness and numbness.  The patient denies swelling, redness, increased warmth deformity, blood loss, weakness, loss of sensation, coolness of extremity, and loss of consciousness.  The patient denies the following risk factors for significant bleeding: aspirin use, anticoagulant use, and history of bleeding disorder.  Screening for risk of abuse was negative.    Current Medications (verified): 1)  Proair Hfa 108 (90 Base) Mcg/act Aers (Albuterol Sulfate) .... Use 2 Puffs Every 4 Hours As Needed For Cough, Wheeze or Shortness of Breath. 2)  Allegra 180 Mg  Tabs (Fexofenadine Hcl) .Marland Kitchen.. 1 By Mouth Once Daily As Needed 3)  Advair Diskus 250-50 Mcg/dose  Misc (Fluticasone-Salmeterol) .Marland Kitchen.. 1 Inh Two Times A Day 4)  Freestyle Lite   Strp (Glucose Blood) .... Check Once Daily 5)  Alprazolam 0.25 Mg  Tabs (Alprazolam) .Marland Kitchen.. 1 By Mouth Three Times A Day As Needed 6)  Nasonex 50 Mcg/act Susp (Mometasone Furoate) .... Use 2 Sprays in Each Nostril Twice Daily. 7)  Lidoderm 5 % Ptch (Lidocaine) .Marland Kitchen.. 1 Every 12 Hours 8)  Flexeril 10 Mg Tabs (Cyclobenzaprine Hcl) .Marland Kitchen.. 1 By Mouth Three Times A Day As  Needed 9)  Tylenol Extra Strength 500 Mg Tabs (Acetaminophen) .... As Needed 10)  Dexilant 60 Mg Cpdr (Dexlansoprazole) .... Take 1 Capsule 30 Min Prior To Breakfast--Holding 11)  Albuterol Sulfate (2.5 Mg/77ml) 0.083% Nebu (Albuterol Sulfate) .Marland Kitchen.. 1 Via Neb Qid As Needed 12)  Vitamin D3 1000 Unit Caps (Cholecalciferol) .Marland Kitchen.. 1 By Mouth Daily 13)  Citracal Calcium+d 600-40-500 Mg-Mg-Unit Xr24h-Tab (Calcium-Magnesium-Vitamin D) .Marland Kitchen.. 1 By Mouth Two Times A Day 14)  Cvs Adult Arm Sling/adjustable  Misc (Misc. Devices) .... Use As Directed  Allergies (verified): 1)  ! Septra 2)  ! Asa 3)  ! Ibuprofen 4)  ! Doxycycline 5)  ! Ultram 6)  ! Gentamicin in Saline 7)  ! Zantac 8)  ! * Periactin 9)  ! Influenza A (H1n1) [place] Vac (Influenza A (H1n1) [place] Vac) 10)  ! Nsaids 11)  ! Flagyl  Past History:  Past Medical History: Last updated: 08/03/2009 Asthma Fibromyalgia Irritable Bowel Syndrome GERD Current Problems:  GERD (ICD-530.81) HELICOBACTER PYLORI GASTRITIS, HX OF (ICD-V12.79) OTHER ACUTE REACTIONS TO STRESS (ICD-308.3) ELBOW PAIN, RIGHT (ICD-719.42) SHOULDER PAIN, RIGHT (ICD-719.41) ELEVATED BLOOD PRESSURE (ICD-796.2) DEPENDENT EDEMA, RIGHT LEG (ICD-782.3) CALF PAIN, RIGHT (ICD-729.5) SHINGLES (ICD-053.9) PREVENTIVE HEALTH CARE (ICD-V70.0) FAMILY HISTORY OF CAD FEMALE 1ST DEGREE RELATIVE <50 (ICD-V17.3) RUPTURE, ACHILLES TENDON (ICD-727.67) BUNIONECTOMY, HX OF (ICD-V15.89) GANGLION CYST, WRIST, RIGHT (ICD-727.41) ASTHMA (ICD-493.90)  Hyperlipidemia  Past Surgical History: Last updated: 06/08/2008 Hysterectomy (2000) Ganglian csyt Foot surgery--- bunionectomy 1983 Tubal ligation achilles tendon rupture--2005  Family History: Last updated: 09/07/2010 Family History of CAD Female 1st degree relative <50 Family History High cholesterol Family History Hypertension Family History Kidney disease M-- AAA repair. HTN Family History Diabetes 1st degree relative--  F fam  hx GB disease No FH of Colon Cancer: B--leukemia  Social History: Last updated: 09/01/2009 Occupation: [employer] Divorced Never Smoked Alcohol use-no Drug use-no Regular exercise-yes  Risk Factors: Alcohol Use: 0 (09/07/2010) Caffeine Use: 2 (09/07/2010) Exercise: yes (09/07/2010)  Risk Factors: Smoking Status: never (09/07/2010) Passive Smoke Exposure: no (09/07/2010)  Family History: Reviewed history from 09/07/2010 and no changes required. Family History of CAD Female 1st degree relative <50 Family History High cholesterol Family History Hypertension Family History Kidney disease M-- AAA repair. HTN Family History Diabetes 1st degree relative--  F fam hx GB disease No FH of Colon Cancer: B--leukemia  Social History: Reviewed history from 09/01/2009 and no changes required. Occupation: [employer] manager [employer] Divorced Never Smoked Alcohol use-no Drug use-no Regular exercise-yes  Review of Systems      See HPI  Physical Exam  General:  Well-developed,well-nourished,in no acute distress; alert,appropriate and cooperative throughout examination Msk:  R arm-- + pain with abduction past 90 degrees and and rotation  no joint swelling and no joint warmth.   Neurologic:  alert & oriented X3 and gait normal.   Cervical Nodes:  No lymphadenopathy noted Psych:  Oriented X3 and normally interactive.     Impression & Recommendations:  Problem # 1:  SHOULDER PAIN, RIGHT (ICD-719.41)  Her updated medication list for this problem includes:    Flexeril 10 Mg Tabs (Cyclobenzaprine hcl) .Marland Kitchen... 1 by mouth three times a day as needed    Tylenol Extra Strength 500 Mg Tabs (Acetaminophen) .Marland Kitchen... As needed  Orders: Orthopedic Surgeon Referral (Ortho Surgeon)  Complete Medication List: 1)  Proair Hfa 108 (90 Base) Mcg/act Aers (Albuterol sulfate) .... Use 2 puffs every 4 hours as needed for cough, wheeze or shortness of breath. 2)  Allegra 180 Mg Tabs  (Fexofenadine hcl) .Marland Kitchen.. 1 by mouth once daily as needed 3)  Advair Diskus 250-50 Mcg/dose Misc (Fluticasone-salmeterol) .Marland Kitchen.. 1 inh two times a day 4)  Freestyle Lite Strp (Glucose blood) .... Check once daily 5)  Alprazolam 0.25 Mg Tabs (Alprazolam) .Marland Kitchen.. 1 by mouth three times a day as needed 6)  Nasonex 50 Mcg/act Susp (Mometasone furoate) .... Use 2 sprays in each nostril twice daily. 7)  Lidoderm 5 % Ptch (Lidocaine) .Marland Kitchen.. 1 every 12 hours 8)  Flexeril 10 Mg Tabs (Cyclobenzaprine hcl) .Marland Kitchen.. 1 by mouth three times a day as needed 9)  Tylenol Extra Strength 500 Mg Tabs (Acetaminophen) .... As needed 10)  Dexilant 60 Mg Cpdr (Dexlansoprazole) .... Take 1 capsule 30 min prior to breakfast--holding 11)  Albuterol Sulfate (2.5 Mg/61ml) 0.083% Nebu (Albuterol sulfate) .Marland Kitchen.. 1 via neb qid as needed 12)  Vitamin D3 1000 Unit Caps (Cholecalciferol) .Marland Kitchen.. 1 by mouth daily 13)  Citracal Calcium+d 600-40-500 Mg-mg-unit Xr24h-tab (Calcium-magnesium-vitamin d) .Marland Kitchen.. 1 by mouth two times a day 14)  Cvs Adult Arm Sling/adjustable Misc (Misc. devices) .... Use as directed Prescriptions: [facility] ADULT ARM SLING/ADJUSTABLE  MISC (MISC. DEVICES) use as directed  #1 x 0   Entered by:   Doristine Devoid CMA   Authorized by:   Loreen Freud DO   Signed by:   Doristine Devoid  CMA on 10/20/2010   Method used:   Print then Give to Patient   RxID:   [id number] ALPRAZOLAM 0.25 MG  TABS (ALPRAZOLAM) 1 by mouth three times a day as needed  #60 x 0   Entered and Authorized by:   Loreen Freud DO   Signed by:   Loreen Freud DO on 10/20/2010   Method used:   Print then Give to Patient   RxID:   [id number]    Orders Added: 1)  Orthopedic Surgeon Referral [Ortho Surgeon] 2)  Est. Patient Level III [56433]

## 2010-12-18 ENCOUNTER — Other Ambulatory Visit: Payer: Self-pay

## 2010-12-18 MED ORDER — LIDOCAINE 5 % EX PTCH: 1.0000 | MEDICATED_PATCH | Freq: Two times a day (BID) | CUTANEOUS | Status: DC

## 2011-01-02 NOTE — Assessment & Plan Note (Signed)
[facility]                            CARDIOLOGY OFFICE NOTE   NAME:Emily Osborne, Emily Osborne                      MRN:          [id number]  DATE:08/17/2008                            DOB:          14-Mar-1961    PRIMARY CARE PHYSICIAN:  Lelon Perla, DO   HISTORY OF PRESENT ILLNESS:  This is a 50 year old with a history of  chest pain who presents to Cardiology Clinic for followup of this chest  pain.  The patient did have her initial episode of chest pain on  November 31, 2009.  She was at work.  She developed sharp central chest  pain that later became a heavy/pressure feeling.  This happened while  she was sitting at her desk at work.  She had had a very stressful day.  She developed shortness of breath along with this chest pain.  She  denied any wheezing, though she does have a history of asthma.  The  chest pain lasted a total of about 60 minutes that resolved finally in  the emergency department after she had had several doses of  nitroglycerin.  She was admitted to the hospital.  Cardiac enzymes were  negative x3.  EKG was unremarkable.  She was discharged home after  having an echocardiogram that showed normal LV size and function.  Since  discharge, she has done quite well.  She had no episodes of chest pain  or heaviness until last night when she was lying in bed.  While she was  in bed last night, she experienced some mild chest heaviness that lasted  just a couple of minutes.  Otherwise, she walks 2-3 times a week for  exercise.  She has no dyspnea on exertion.  Her asthma is under good  control.  She does not wheeze very often.  She is on Advair for asthma.   PAST MEDICAL HISTORY:  1. Asthma.  2. Fibromyalgia.  3. Irritable bowel syndrome.  4. Gastroesophageal reflux disease.  5. Hysterectomy.  6. Anxiety.  7. Echocardiogram done July 20, 2008, showed an EF of 55-60%,      normal LV size, and normal valvular function.   SOCIAL  HISTORY:  The patient works for [employer].  She is  divorced.  She never smoked.  She does not drink any alcohol.   FAMILY HISTORY:  The patient's brother had an MI in his 77s and her  father died at the age of 70.  He did have coronary artery disease as  well.   REVIEW OF SYSTEMS:  Negative except as noted in the history of present  illness.   MEDICATIONS:  1. Xanax 0.25 mg daily.  2. Advair 250/50 b.i.d.  3. Nasonex.   EKG was reviewed showed normal sinus rhythm that was a normal EKG.  Most  recent labs from December 2009, cardiac enzymes negative x3, LDL 121,  HDL 40, creatinine 0.96, TSH normal.  Radiology, the patient had a CT of  the chest on July 20, 2008, that showed no pulmonary embolus or  pneumonia.   PHYSICAL EXAMINATION:  VITAL SIGNS:  Blood pressure is 118/80, heart  rate is 67.  GENERAL:  This is a well-developed female in no apparent distress.  NEUROLOGIC:  Alert and oriented x3.  Normal affect.  LUNGS:  Clear to auscultation bilaterally with normal respiratory  effort.  HEENT:  Normal exam.  NECK:  There is no JVD.  There is no thyromegaly or thyroid nodule.  CARDIOVASCULAR:  Heart regular S1, S2.  No S3, no S4.  No murmur.  There  is no carotid bruit.  There is no peripheral edema.  There are 2+  posterior tibial pulses bilaterally.  ABDOMEN:  Soft, nontender.  No hepatosplenomegaly.  Normal bowel sounds.  EXTREMITIES:  No clubbing or cyanosis.  SKIN:  Normal exam.  MUSCULOSKELETAL:  Normal exam.   ASSESSMENT/PLAN:  This is a 50 year old with a history of atypical chest  pain and with her only risk factor being family history of premature  coronary disease.  1. Chest pain.  The patient's only cardiac risk factor appears to be a      family history of premature coronary artery disease.  She is not      hypertensive.  Her LDL cholesterol was less than 130.  She does not      have diabetes and she does not smoke.  Her EKG is reviewed today       and is normal.  The patient has had 2 episodes of chest pain, one      seemed to be somewhat relieved by nitroglycerin.  Although I think      she probably does not have significant coronary disease, I think it      would be reasonable to do an exercise treadmill test, as she has      normal EKG.  We will go ahead and set her up for that and try to      get that done within the next week.  2. Lipids.  The patient's LDL is 121, which would be at goal for her      in the absence of coronary disease or coronary disease equivalent.      However, the patient does have a family history of early coronary      disease.  Therefore, I am going to check a lipoprotein A.  If her      lipoprotein A is within normal limits, I do not think she needs any      cholesterol medicine and would just be recommended to work on diet      and exercise to get her cholesterol down.  If her lipoprotein A is      elevated, I probably would suggest that she go on cholesterol      medicine.     Marca Ancona, MD  Electronically Signed    DM/MedQ  DD: 08/17/2008  DT: 08/17/2008  Job #: [id number]   cc:   Lelon Perla, DO

## 2011-01-02 NOTE — H&P (Signed)
Emily Osborne, Emily Osborne               ACCOUNT NO.:  [id number]   MEDICAL RECORD NO.:  [id number]          PATIENT TYPE:  INP   LOCATION:  2021                         FACILITY:  [facility]   PHYSICIAN:  Michiel Cowboy, MDDATE OF BIRTH:  22-Sep-1960   DATE OF ADMISSION:  07/20/2008  DATE OF DISCHARGE:                              HISTORY & PHYSICAL   PRIMARY CARE Wynetta Seith:  Dr. Susann Givens with [facility] [place].   CHIEF COMPLAINT:  Chest pain.   The patient is a 50 year old female with history of fibromyalgia, asthma  and anxiety disorder who was at work today, then all of the sudden she  developed sharp stabbing chest pain that was radiating to her back.  It  lasted for about 30 minutes or so and then gradually got better.  It  began to radiate to her neck bilaterally.  She eventually presented to  the emergency department where her cardiac markers and EKG were within  normal limits.  Her chest x-ray was within normal limits as well, as  well as a D-dimer at which point admission was called for [place].  The  patient in transit from [facility] to [facility] developed another  episode of chest pain, this time around her left breast.  It was not as  sharp, non-radiational.  It was relieved by nitroglycerin.  Otherwise,  the patient has a history of severe GERD, but has been doing well from  that standpoint.  She does not really exercise much, but does not have  chest pain or shortness of breath when she walks around.   REVIEW OF SYSTEMS:  No fevers, no chills, no nausea, no vomiting.  Otherwise review of systems unremarkable.   PAST MEDICAL HISTORY:  1. History of fibromyalgia.  2. History of anxiety.  3. History of hypoglycemia.  4. History of Achilles tendon rupture.  5. GERD.  6. Helicobacter pylori gastritis.  7. Asthma.   MEDICATIONS:  1. Allegra 180 mg daily.  2. Advair 250/50 mcg twice a day.  3. Kapidex 60 mg 1 tablet daily.  4. Xanax 0.25 mg, the patient takes q.a.m.  5. Nasonex  50 mg spray.  6. ProAir 180 as needed.   FAMILY HISTORY:  Significant for brother with early onset cardiac  disease with MI in his 11s and history of diabetes.  Father died from  heart failure and renal failure with history of diabetes in his 28s.   ALLERGIES:  1. SEPTRA  2. ASPIRIN AND IBUPROFEN CAUSE ANAPHYLACTIC SHOCK.  3. DOXYCYCLINE.  4. ULTRAM.  5. GENTAMICIN EYE DROPS.  6. ZANTAC.  7. PERIACTIN.   SOCIAL HISTORY:  The patient never smoked, does not drink.  Works as a  Engineer, civil (consulting) at [employer].   PHYSICAL EXAMINATION:  VITAL SIGNS:  Temperature 97.8, blood pressure  126/92, pulse 65, respirations 18.  Satting 100% on 2 liters.  GENERAL:  The patient appears to be in no acute distress, resting on the  bed.  HEENT:  Head nontraumatic.  Moist mucous membranes.  LUNGS:  Clear to auscultation bilaterally.  No wheezes or rhonchi noted.  LOWER EXTREMITIES:  Without clubbing, cyanosis or edema.  Strength 5/5  in all four extremities.  ABDOMEN:  Soft, nontender, nondistended.  NEUROLOGIC:  Cranial nerves intact.   LABORATORY DATA:  White blood cell count 5.8, hemoglobin 13, sodium 141,  potassium 4.0, creatinine 1.0, calcium 9.8, CK 465, troponin less than  0.01, MB 1.8.  D-dimer 0.3.  EKG showing normal sinus rhythm, no change  from prior.  There are inversion T-waves in leads III and aV1 which is  old.  Chest x-ray is within normal limits, no abnormalities.   ASSESSMENT AND PLAN:  This is a 50 year old female with atypical chest  pain, but sharp and stabbing in etiology with a family history of early  onset cardiac disease and also history of anxiety.   1. Chest pain.  Given family history risk factors will cycle cardiac      enzymes q.8.  Will do serial EKGs.  Other risk stratify with      fasting lipid panel, hemoglobin A1c, check TSH.  Given sharp chest      pain, will obtain CT scan of the chest with contrast to rule out      dissection, although given how stable the  patient is this is      unlikely.  2. History of fibromyalgia.  Continue Tylenol as needed.  3. History of anxiety.  Continue Xanax.  4. History of asthma.  Continue Advair plus albuterol p.r.n.  5. History of severe gastroesophageal reflux disease.  May explain      epigastric/chest pain.  Will continue Protonix and her home GERD      medication.  Would actually change Protonix to b.i.d. to see if      this may help [place] with her symptom management.  6. Prophylaxis.  Lovenox plus Protonix.      Michiel Cowboy, MD  Electronically Signed     AVD/MEDQ  D:  07/20/2008  T:  07/20/2008  Job:  [id number]   cc:   Sharlot Gowda, M.D.  Valerie A. Felicity Coyer, MD

## 2011-01-02 NOTE — Discharge Summary (Signed)
Emily Osborne, Emily Osborne               ACCOUNT NO.:  [id number]   MEDICAL RECORD NO.:  [id number]          PATIENT TYPE:  INP   LOCATION:  2021                         FACILITY:  [facility]   PHYSICIAN:  Raenette Rover. Felicity Coyer, MDDATE OF BIRTH:  20-Nov-1960   DATE OF ADMISSION:  07/19/2008  DATE OF DISCHARGE:  07/20/2008                               DISCHARGE SUMMARY   DISCHARGE DIAGNOSES:  1. Atypical chest pain.  2. Mild dyslipidemia.  3. Gastroesophageal reflux disease.  4. Asthma.  5. History of fibromyalgia.   HISTORY OF PRESENT ILLNESS:  Emily Osborne is a 50 year old African  American female admitted on July 20, 2008 with a chief complaint of  substernal sharp chest pain which started approximately 5 p.m. yesterday  prior to leaving work and lasted for approximately 25 minutes.  She was  admitted for further evaluation and treatment.   COURSE OF HOSPITALIZATION:  1. Atypical chest pain.  The patient was admitted.  She underwent      serial cardiac enzymes.  Cardiac enzymes at this point are      negative.  We have one additional set pending this afternoon.  Her      D-dimer was within normal limits.  CT angio was negative for PE or      acute process.  It did note some borderline cardiomegaly and      question mild esophageal dysmotility, as there is noted to be an      air-fluid level in the esophagus.  She is continued on her proton      pump inhibitor.  Her chest pain has resolved.  As there was some      mild cardiomegaly noted on CT angio, a 2-D echo has been ordered.      We will await last set of cardiac enzymes and completion of 2-D      echo and anticipate discharge to home later this afternoon.  We      will defer outpatient stress testing to the patient's primary MD.   DISCHARGE MEDICATIONS:  1. Allegra 180 mg p.o. daily.  2. Kapidex 1 tablet p.o. daily.  3. Xanax 0.25 mg p.o. daily.  4. Advair 250/50 one puff daily.  5. Nasonex 1 spray each nostril daily.  6. Extra  Strength Tylenol 2 tablets p.o. as needed.   DISPOSITION:  The patient will be discharged to home.   FOLLOWUP:  She is to follow up with Dr. Loreen Freud on July 27, 2008  at 1:45 p.m.  She will need review of her 2-D echo and further  discussion for needs of an outpatient stress test at this appointment.   TIME SPENT:  Greater than 30 minutes were spent on discharge planning.      Sandford Craze, NP      Raenette Rover. Felicity Coyer, MD  Electronically Signed    MO/MEDQ  D:  07/20/2008  T:  07/20/2008  Job:  [id number]   cc:   Lelon Perla, DO

## 2011-01-05 NOTE — Op Note (Signed)
   NAME:  Emily Osborne, Emily Osborne                         ACCOUNT NO.:  [id number]   MEDICAL RECORD NO.:  [id number]                   PATIENT TYPE:  OIB   LOCATION:  2860                                 FACILITY:  [facility]   PHYSICIAN:  Burnard Bunting, M.D.                 DATE OF BIRTH:  [birth year]   DATE OF PROCEDURE:  01/15/2003  DATE OF DISCHARGE:  01/15/2003                                 OPERATIVE REPORT   PREOPERATIVE DIAGNOSIS:  Right Achilles tendon rupture.   POSTOPERATIVE DIAGNOSIS:  Right Achilles tendon rupture.   PROCEDURE:  Right Achilles tendon rupture repair.   SURGEON:  Burnard Bunting, M.D.   ANESTHESIA:  General endotracheal anesthesia.   ESTIMATED BLOOD LOSS:  3 cc.   DRAINS:  None.   TOURNIQUET TIME:  58 minutes at 300 mmHg.   DESCRIPTION OF PROCEDURE:  The patient was brought to the operating room  where general endotracheal anesthesia was induced.  Preoperative IV  antibiotics were administered.  The patient was placed prone on the  operating table with all extremities, chest and chin well padded. Both legs  below the knee were prepped with Duraprep solution and draped in a sterile  manner. Collier Flowers was used to cover the operative field on the right hand side.  The leg was elevated and exsanguinated with the Esmarch wrap. The tourniquet  was inflated on the right.   An incision was made on the medial aspect of the border of the Achilles  tendon over a distance of about 10 cm. The skin and subcutaneous tissue were  sharply divided. The paratenon was divided also. Torn Achilles tendon ends  were encountered. Then two #2 fiber wire sutures were placed in each torn  end of the Achilles tendon in a modified grasping suture fashion. With the  knees flexed, the right Achilles tendon was tensioned to the resting tension  on the left Achilles tendon. Then 4 fiber wire knots were tied. The tension  felt symmetric between the 2 feet.   At this time the incision was  irrigated. The paratenon was then closed using  interrupted 3-0 Vicryl suture. The skin was closed using interrupted  inverted 3-0 Vicryl suture and 3-0 nylon suture. The foot was then dressed  with Xeroform and a well padded posterior splint. The patient was  transferred to the recovery room in stable condition.                                               Burnard Bunting, M.D.    GSD/MEDQ  D:  01/15/2003  T:  01/17/2003  Job:  [id number]

## 2011-01-05 NOTE — H&P (Signed)
   NAME:  Emily Osborne, Emily Osborne                         ACCOUNT NO.:  [id number]   MEDICAL RECORD NO.:  [id number]                   PATIENT TYPE:  EMS   LOCATION:  [facility]                                 FACILITY:  [facility]   PHYSICIAN:  Burnard Bunting, M.D.                 DATE OF BIRTH:  03/24/1961   DATE OF ADMISSION:  01/12/2003  DATE OF DISCHARGE:                                HISTORY & PHYSICAL   CHIEF COMPLAINT:  Right foot pain.   HISTORY OF PRESENT ILLNESS:  Emily Osborne is a 50 year old female who was  playing basketball when she felt a pop today around 8.  She has been unable  to bear weight on that right side.  She denies any other orthopedic  complaints.   PAST MEDICAL HISTORY:  1. Notable for asthma.  2. Fibromyalgia.   CURRENT MEDICATIONS:  1. Advair.  2. Allegra.  3. Estradial.  4. Nasonex.  5. Nexium.  6. Albuterol.   ALLERGIES:  1. She is allergic to ASPIRIN.  2. NONSTEROIDALS.  3. SULFA.  4. GENTAMYCIN.  5. _____MYCIN.  6. PERIACTIN.  7. ULTRAM.   PHYSICAL EXAMINATION:  MUSCULOSKELETAL:  She does have tenderness and pain  around the Achilles tendon region on the right hand side.  Pedal pulses are  palpable.  Defect is noted in the Achilles tendon region.  Active range of  motion is otherwise intact.  Knee examination is normal.  She does have  positive Thompson test for tear of the Achilles tendon on the right hand  side.   DIAGNOSTIC STUDIES:  No x-rays were made.   IMPRESSION:  Right Achilles tendon tear.   PLAN:  Patient is placed in a posterior splint with crutches.  She is  instructed to be non-weightbearing.  She is given Vicodin for pain.  She  needs to elevate the right leg.  She needs to come back to the clinic on  Thursday for a likely scheduling for repair on Monday.  Risks and benefits  of the nonoperative and operative therapy are discussed with  the patient.  Primary risks of nonoperative therapy is re-rupture.  She is  active and wants to  resume an active lifestyle.  Primary risk of operative  repair is wound healing and infection again she does have palpable pedal  pulses and I think that a very low likelihood with no other co-morbidity.                                                 Burnard Bunting, M.D.    GSD/MEDQ  D:  01/12/2003  T:  01/13/2003  Job:  [id number]

## 2011-02-27 ENCOUNTER — Other Ambulatory Visit: Payer: Self-pay | Admitting: *Deleted

## 2011-02-27 MED ORDER — LIDOCAINE 5 % EX PTCH: 1.0000 | MEDICATED_PATCH | Freq: Two times a day (BID) | CUTANEOUS | Status: DC

## 2011-02-27 NOTE — Telephone Encounter (Signed)
Done by MD. Left message on vm notifying the pt.

## 2011-02-27 NOTE — Telephone Encounter (Signed)
Pt left message requesting refill of the above. Please advise.

## 2011-03-05 ENCOUNTER — Encounter: Payer: Self-pay | Admitting: Family Medicine

## 2011-03-06 ENCOUNTER — Encounter: Payer: Self-pay | Admitting: Family Medicine

## 2011-03-06 ENCOUNTER — Ambulatory Visit (INDEPENDENT_AMBULATORY_CARE_PROVIDER_SITE_OTHER): Payer: BC Managed Care – PPO | Admitting: Family Medicine

## 2011-03-06 DIAGNOSIS — M25519 Pain in unspecified shoulder: Secondary | ICD-10-CM

## 2011-03-06 DIAGNOSIS — R7309 Other abnormal glucose: Secondary | ICD-10-CM

## 2011-03-06 DIAGNOSIS — R739 Hyperglycemia, unspecified: Secondary | ICD-10-CM

## 2011-03-06 DIAGNOSIS — IMO0001 Reserved for inherently not codable concepts without codable children: Secondary | ICD-10-CM

## 2011-03-06 DIAGNOSIS — R5381 Other malaise: Secondary | ICD-10-CM

## 2011-03-06 DIAGNOSIS — I10 Essential (primary) hypertension: Secondary | ICD-10-CM

## 2011-03-06 DIAGNOSIS — R5383 Other fatigue: Secondary | ICD-10-CM

## 2011-03-06 DIAGNOSIS — M791 Myalgia, unspecified site: Secondary | ICD-10-CM

## 2011-03-06 DIAGNOSIS — E785 Hyperlipidemia, unspecified: Secondary | ICD-10-CM

## 2011-03-06 LAB — POCT URINALYSIS DIPSTICK
Blood, UA: NEGATIVE
Ketones, UA: NEGATIVE
Leukocytes, UA: NEGATIVE
Protein, UA: NEGATIVE
pH, UA: 5

## 2011-03-06 LAB — CBC WITH DIFFERENTIAL/PLATELET
Basophils Absolute: 0 10*3/uL (ref 0.0–0.1)
Eosinophils Absolute: 0.1 10*3/uL (ref 0.0–0.7)
Lymphocytes Relative: 40 % (ref 12.0–46.0)
Monocytes Relative: 8 % (ref 3.0–12.0)
Platelets: 234 10*3/uL (ref 150.0–400.0)
RDW: 14 % (ref 11.5–14.6)

## 2011-03-06 LAB — BASIC METABOLIC PANEL
BUN: 13 mg/dL (ref 6–23)
Calcium: 9.5 mg/dL (ref 8.4–10.5)
Creatinine, Ser: 0.9 mg/dL (ref 0.4–1.2)
GFR: 87.08 mL/min (ref >60.00)
Glucose, Bld: 84 mg/dL (ref 70–99)

## 2011-03-06 LAB — HEPATIC FUNCTION PANEL
Albumin: 4.5 g/dL (ref 3.5–5.2)
Alkaline Phosphatase: 68 U/L (ref 39–117)
Bilirubin, Direct: 0 mg/dL (ref 0.0–0.3)

## 2011-03-06 LAB — LIPID PANEL
HDL: 52.4 mg/dL (ref >39.00)
Total CHOL/HDL Ratio: 4
Triglycerides: 135 mg/dL (ref 0.0–149.0)
VLDL: 27 mg/dL (ref 0.0–40.0)

## 2011-03-06 LAB — VITAMIN B12: Vitamin B-12: 287 pg/mL (ref 211–911)

## 2011-03-06 LAB — HEMOGLOBIN A1C: Hgb A1c MFr Bld: 6.1 % (ref 4.6–6.5)

## 2011-03-06 NOTE — Patient Instructions (Addendum)
Shoulder Exercises   RANGE OF MOTION AND STRETCHING EXERCISES  These exercises may help you when beginning to rehabilitate your injury.  Your symptoms may resolve with or without further involvement from your physician, physical therapist or athletic trainer.  While completing these exercises, remember:   Restoring tissue flexibility helps normal motion to return to the joints.  This allows healthier, less painful movement and activity.  An effective stretch should be held for at least 30 seconds.  A stretch should never be painful.  You should only feel a gentle lengthening or release in the stretched tissue.      ROM - Pendulum   Bend at the waist so that your __________ arm falls away from your body.  Support yourself with your opposite hand on a solid surface, such as a table or a countertop.  Your __________ arm should be perpendicular to the ground.  If it is not perpendicular, you need to lean over farther.  Relax the muscles in your __________ arm and shoulder as much as possible.  Gently sway your hips and trunk so they move your __________ arm without any use of your __________ shoulder muscles.  Progress your movements so that your __________ arm moves side to side, then forward and backward, and finally, both clockwise and counterclockwise.  Complete __________ repetitions in each direction.  Many people use this exercise to relieve discomfort in their shoulder as well as to gain range of motion. Repeat __________ times.  Complete this exercise __________ times per day.     STRETCH - Flexion, Standing    Stand with good posture.  With an underhand grip on your __________ hand and an overhand grip on the opposite hand, grasp a broomstick or cane so that your hands are a little more than shoulder-width apart.  Keeping your __________ elbow straight and shoulder muscles relaxed, push the stick with your opposite hand to raise your __________ arm in front of your body and then  overhead.  Raise your arm until you feel a stretch in your __________ shoulder, but before you have increased shoulder pain.  Try to avoid shrugging your __________ shoulder as your arm rises by keeping your shoulder blade tucked down and toward your mid-back spine.  Hold __________ seconds.  Slowly return to the starting position.  Repeat __________ times.  Complete this exercise __________ times per day.       STRETCH - Internal Rotation    Place your __________ hand behind your back, palm-up.  Throw a towel or belt over your opposite shoulder.  Grasp the towel/belt with your __________ hand.  While keeping an upright posture, gently pull up on the towel/belt until you feel a stretch in the front of your __________ shoulder.  Avoid shrugging your __________ shoulder as your arm rises by keeping your shoulder blade tucked down and toward your mid-back spine.    Hold __________.  Release the stretch by lowering your opposite hand.  Repeat __________ times.  Complete this exercise __________ times per day.       STRETCH - External Rotation and Abduction  Stagger your stance through a doorframe.  It does not matter which foot is forward.  As instructed by your physician, physical therapist or athletic trainer, place your hands: l And forearms above your head and on the door frame. l And forearms at head-height and on the door frame. l At elbow-height and on the door frame.  Keeping your head and chest upright and your stomach muscles  tight to prevent over-extending your low-back, slowly shift your weight onto your front foot until you feel a stretch across your chest and/or in the front of your shoulders.    Hold __________ seconds.  Shift your weight to your back foot to release the stretch. Repeat __________ times.  Complete this stretch __________ times per day.      STRENGTHENING EXERCISES  These exercises may help you when beginning to rehabilitate your injury.  They may  resolve your symptoms with or without further involvement from your physician, physical therapist or athletic trainer.  While completing these exercises, remember:   Muscles can gain both the endurance and the strength needed for everyday activities through controlled exercises.  Complete these exercises as instructed by your physician, physical therapist or athletic trainer.  Progress the resistance and repetitions only as guided.  You may experience muscle soreness or fatigue, but the pain or discomfort you are trying to eliminate should never worsen during these exercises.  If this pain does worsen, stop and make certain you are following the directions exactly.  If the pain is still present after adjustments, discontinue the exercise until you can discuss the trouble with your clinician.  If advised by your physician, during your recovery, avoid activity or exercises which involve actions that place your __________ hand or elbow above your head or behind your back or head.  These positions stress the tissues which are trying to heal.      STRENGTH - Scapular Depression and Adduction   With good posture, sit on a firm chair.  Supported your arms in front of you with pillows, arm rests or a table top.  Have your elbows in line with the sides of your body.  Gently draw your shoulder blades down and toward your mid-back spine.  Gradually increase the tension without tensing the muscles along the top of your shoulders and the back of your neck.  Hold for __________ seconds.  Slowly release the tension and relax your muscles completely before completing the next repetition.  After you have practiced this exercise, remove the arm support and complete it in standing as well as sitting. Repeat __________ times.  Complete this exercise __________ times per day.      STRENGTH - External Rotators   Secure a rubber exercise band/tubing to a fixed object so that it is at the same height as your  __________ elbow when you are standing or sitting on a firm surface.  Stand or sit so that the secured exercise band/tubing is at your side that is not injured.    Bend your elbow 90 degrees.  Place a folded towel or small pillow under your __________ arm so that your elbow is a few inches away from your side.  Keeping the tension on the exercise band/tubing, pull it away from your body, as if pivoting on your elbow.  Be sure to keep your body steady so that the movement is only coming from your shoulder rotating.  Hold __________ seconds.  Release the tension in a controlled manner as you return to the starting position.  Repeat __________ times. Complete this exercise __________ times per day.      STRENGTH - Supraspinatus   Stand or sit with good posture.  Grasp a __________ lb weight or an exercise band/tubing so that your hand is "thumbs-up," like when you shake hands.  Slowly lift your __________ hand from your thigh into the air, traveling about 30 degrees from straight out at  your side.  Lift your hand to shoulder height or as far as you can without increasing any shoulder pain.  Initially, many people do not lift their hands above shoulder height.    Avoid shrugging your __________ shoulder as your arm rises by keeping your shoulder blade tucked down and toward your mid-back spine.    Hold for __________ seconds.  Control the descent of your hand as you slowly return to your starting position.  Repeat __________ times.  Complete this exercise __________ times per day.      STRENGTH - Shoulder Extensors   Secure a rubber exercise band/tubing so that it is at the height of your shoulders when you are either standing or sitting on a firm arm-less chair.  With a thumbs-up grip, grasp an end of the band/tubing in each hand.  Straighten your elbows and lift your hands straight in front of you at shoulder height.  Step back away from the secured end of band/tubing until it becomes  tense.  Squeezing your shoulder blades together, pull your hands down to the sides of your thighs.  Do not allow your hands to go behind you.   Hold for __________ seconds.   Slowly ease the tension on the band/tubing as you reverse the directions and return to the starting position.   Repeat __________ times.  Complete this exercise __________ times per day.      STRENGTH - Scapular Retractors  Secure a rubber exercise band/tubing so that it is at the height of your shoulders when you are either standing or sitting on a firm arm-less chair.  With a palm-down grip, grasp an end of the band/tubing in each hand.  Straighten your elbows and lift your hands straight in front of you at shoulder height.  Step back away from the secured end of band/tubing until it becomes tense.  Squeezing your shoulder blades together, draw your elbows back as you bend them.  Keep your upper arm lifted away from your body throughout the exercise.  Hold __________ seconds.  Slowly ease the tension on the band/tubing as you reverse the directions and return to the starting position.   Repeat __________ times.  Complete this exercise __________ times per day.     STRENGTH - Scapular Depressors  Find a sturdy chair without wheels, such as a from a dining room table.  Keeping your feet on the floor, lift your bottom from the seat and lock your elbows.  Keeping your elbows straight, allow gravity to pull your body weight down.  Your shoulders will rise toward your ears.  Raise your body against gravity by drawing your shoulder blades down your back, shortening the distance between your shoulders and ears.  Although your feet should always maintain contact with the floor, your feet should progressively support less body weight as you get stronger.    Hold __________ seconds.  In a controlled and slow manner, lower your body weight to begin the next repetition. Repeat __________ times.  Complete this exercise  __________ times per day.                                            Document Released: 06/20/2005  Document Re-Released: 06/02/2009 [facility] Patient Information 2011 [place], [place].Fatigue Fatigue is a feeling of tiredness, lack of energy, lack of motivation, or feeling tired all the time. Having enough rest, good  nutrition, and reducing stress will normally reduce fatigue. Consult your caregiver if it persists. The nature of your fatigue will help your caregiver to find out its cause. The treatment is based on the cause.  CAUSES There are many causes for fatigue. Most of the time, fatigue can be traced to one or more of your habits or routines. Most causes fit into one or more of three general areas. They are: Lifestyle problems  Sleep disturbances.  Overwork.   Physical exertion.   Unhealthy habits  Poor eating habits or eating disorders   Alcohol and/or drug use   Lack of proper nutrition (malnutrition).   Psychological problems  Stress and/or anxiety problems.  Depression.   Grief.  Boredom.   Medical Problems or Conditions  Anemia.  Pregnancy.   Thyroid gland problems.   Recovery from major surgery.   Continuous pain.   Emphysema or asthma that is not well controlled   Allergic conditions.   Diabetes.   Infections (such as mononucleosis).   Obesity.   Sleep disorders, such as sleep apnea.  Heart failure or other heart-related problems.   Cancer.   Kidney disease.   Liver disease.   Effects of certain medicines such as antihistamines, cough and cold remedies, prescription pain medicines, heart and blood pressure medicines, drugs used for treatment of cancer, and some antidepressants.   SYMPTOMS The symptoms of fatigue include:   Lack of energy.  Lack of drive (motivation).   Drowsiness.  Feeling of indifference to the surroundings.   DIAGNOSIS The details of how you feel help guide your caregiver in finding out what is  causing the fatigue. You will be asked about your present and past health condition. It is important to review all medicines that you take, including prescription and non-prescription items. A thorough exam will be done. You will be questioned about your feelings, habits, and normal lifestyle. Your caregiver may suggest blood tests, urine tests, or other tests to look for common medical causes of fatigue.  TREATMENT Fatigue is treated by correcting the underlying cause. For example, if you have continuous pain or depression, treating these causes will improve how you feel. Similarly, adjusting the dose of certain medicines will help in reducing fatigue.  HOME CARE INSTRUCTIONS  Try to get the required amount of good sleep every night.   Eat a healthy and nutritious diet, and drink enough water throughout the day.   Practice ways of relaxing (including yoga or meditation).   Exercise regularly.   Make plans to change situations that cause stress. Act on those plans so that stresses decrease over time. Keep your work and personal routine reasonable.   Avoid street drugs and minimize use of alcohol.   Start taking a daily multivitamin after consulting your caregiver.  SEEK MEDICAL CARE IF:  You have persistent tiredness, which cannot be accounted for.   You have fever.   You have unintentional weight loss.   You have headaches.   You have disturbed sleep throughout the night.   You are feeling sad.   You have constipation.   You have dry skin.   You have gained weight.   You are taking any new or different medicines that you suspect are causing fatigue.   You are unable to sleep at night.   You develop any unusual swelling of your legs or other parts of your body.  SEEK IMMEDIATE MEDICAL CARE IF:  You are feeling confused.   Your vision is blurred.   You  feel faint or pass out.   You develop severe headache.   You develop severe abdominal, pelvic, or back pain.    You develop chest pain, shortness of breath, or an irregular or fast heartbeat.   You are unable to pass a normal amount of urine.   You develop abnormal bleeding such as bleeding from the rectum or you vomit blood.   You have thoughts about harming yourself or committing suicide.   You are worried that you might harm someone else.  MAKE SURE YOU:   Understand these instructions.   Will watch your condition.   Will get help right away if you are not doing well or get worse.  REFERENCES   [employer]  [email or web address]  [employer]  medicalance.com Document Released: 06/03/2007 Document Re-Released: 07/19/2008 [facility] Patient Information 2011 [place], [place].

## 2011-03-06 NOTE — Progress Notes (Signed)
  Subjective:    Patient ID: Emily Osborne, female    DOB: 08-May-1961, 50 y.o.   MRN: [id number]  HPI  Pt here c/o fatigue and neck pain --she is seeing ortho for shoulder /neck pain.  She is unable to go to PT because her flex spending is used up.  Pt thought she may have a UTI or vita D low again.   She wants to sleep alot  Review of Systems    as above Objective:   Physical Exam  Constitutional: She is oriented to person, place, and time. She appears well-developed and well-nourished.  Neck: Normal range of motion. Neck supple.  Cardiovascular: Normal rate, regular rhythm and normal heart sounds.   Pulmonary/Chest: Effort normal and breath sounds normal.  Lymphadenopathy:    She has no cervical adenopathy.  Neurological: She is alert and oriented to person, place, and time.  Psychiatric: She has a normal mood and affect. Her behavior is normal. Judgment and thought content normal.          Assessment & Plan:

## 2011-03-07 ENCOUNTER — Encounter: Payer: Self-pay | Admitting: *Deleted

## 2011-03-07 ENCOUNTER — Telehealth: Payer: Self-pay | Admitting: *Deleted

## 2011-03-07 MED ORDER — CYANOCOBALAMIN 1000 MCG/ML IJ SOLN: INTRAMUSCULAR | Status: DC

## 2011-03-07 NOTE — Telephone Encounter (Signed)
Message copied by Verdene Rio on Wed Mar 07, 2011  4:45 PM ------      Message from: Lelon Perla      Created: Wed Mar 07, 2011  7:25 AM       b12 low normal---  She may benefit from injections weekly x4 then monthly or she can try sublingual B12 daily      Cholesterol--- LDL goal < 100,  HDL >50,  TG < 150.  Diet and exercise will increase HDL and decrease LDL and TG.  Fish,  Fish Oil, Flaxseed oil will also help increase the HDL and decrease Triglycerides.   Recheck labs in 3 months.   TG are creeping up and glucose creeping up as well.   Watch diet and exercise.    790.6,  272.4  285.6   B12, lipid, hep, bmp, hgba1c

## 2011-03-07 NOTE — Assessment & Plan Note (Signed)
Check labs MVI daily B complex

## 2011-03-07 NOTE — Assessment & Plan Note (Signed)
Per ortho.  

## 2011-03-07 NOTE — Telephone Encounter (Signed)
Pt aware of lab results. 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 Rx sent to pharmacy, copy of labs mailed.

## 2011-03-08 ENCOUNTER — Other Ambulatory Visit: Payer: Self-pay | Admitting: Family Medicine

## 2011-03-08 LAB — VITAMIN D 1,25 DIHYDROXY
Vitamin D 1, 25 (OH)2 Total: 50 pg/mL (ref 18–72)
Vitamin D2 1, 25 (OH)2: <8 pg/mL

## 2011-03-08 MED ORDER — "SYRINGE/NEEDLE (DISP) 25G X 5/8"" 3 ML MISC": Status: DC

## 2011-03-08 NOTE — Telephone Encounter (Signed)
rx for b-12 was called in - patient needs syringes called - [facility] [address]

## 2011-03-08 NOTE — Telephone Encounter (Signed)
Rx faxed to pharmacy  

## 2011-03-09 ENCOUNTER — Encounter: Payer: Self-pay | Admitting: *Deleted

## 2011-04-02 ENCOUNTER — Other Ambulatory Visit: Payer: Self-pay | Admitting: Orthopedic Surgery

## 2011-04-02 DIAGNOSIS — M5410 Radiculopathy, site unspecified: Secondary | ICD-10-CM

## 2011-04-06 ENCOUNTER — Ambulatory Visit
Admission: RE | Admit: 2011-04-06 | Discharge: 2011-04-06 | Disposition: A | Discharge status: Home / Self Care | Payer: BC Managed Care – PPO | Source: Ambulatory Visit | Attending: Orthopedic Surgery | Admitting: Orthopedic Surgery

## 2011-04-06 DIAGNOSIS — M5410 Radiculopathy, site unspecified: Secondary | ICD-10-CM

## 2011-04-12 ENCOUNTER — Other Ambulatory Visit: Payer: Self-pay | Admitting: Family Medicine

## 2011-04-12 DIAGNOSIS — D649 Anemia, unspecified: Secondary | ICD-10-CM

## 2011-04-13 ENCOUNTER — Other Ambulatory Visit (INDEPENDENT_AMBULATORY_CARE_PROVIDER_SITE_OTHER): Payer: BC Managed Care – PPO

## 2011-04-13 DIAGNOSIS — D649 Anemia, unspecified: Secondary | ICD-10-CM

## 2011-04-13 NOTE — Progress Notes (Signed)
Labs only

## 2011-04-20 ENCOUNTER — Telehealth: Payer: Self-pay

## 2011-04-20 ENCOUNTER — Telehealth: Payer: Self-pay | Admitting: Gastroenterology

## 2011-04-20 NOTE — Telephone Encounter (Signed)
Call from patient and she stated she has had Diarrhea x's 4 days. I review her Hx and she has had H,pylori and some there problems, she had not called GI. She stated she only wanted to see Dr.Lowne and will not eat since every time she does she has the diarrhea. I advised the patient she will need to see someone and offered appt with Dr.Paz today, she declined. I advised to call GI doc and let them know whats going on and I would advise Dr.Lowne to see if she has any suggestions. Please advise    KP

## 2011-04-20 NOTE — Telephone Encounter (Signed)
That would be fine 

## 2011-04-20 NOTE — Telephone Encounter (Signed)
Pt instructed to try Immodium OTC and to call us if no improvement.

## 2011-04-20 NOTE — Telephone Encounter (Signed)
No fever but she is having Abdominal pain, she stated she is drinking liquids and waiting on call from GI     KP

## 2011-04-20 NOTE — Telephone Encounter (Signed)
She would need to be seen by before we could rx----any possibility of food poisoning ,  Any fever etc--?  If no she could try Immodium but if no relief she would need ov.

## 2011-04-20 NOTE — Telephone Encounter (Signed)
Pt states she has been having diarrhea for 3 days. The first day she states she had 12 diarrhea stools. The next day she had 6 and has continued with 6. Now states that when she eats anything it goes right through her. She states she called her PCP and they told her to call us since she has had H Pylori in the past. Let pt know H Pylori didn't necessarily cause diarrhea. Pt has not taken anything OTC for the diarrhea at all. Dr. Arlyce Dice should we just have her try Immodium OTC for the diarrhea? Please advise.

## 2011-05-01 ENCOUNTER — Telehealth: Payer: Self-pay | Admitting: Family Medicine

## 2011-05-01 NOTE — Telephone Encounter (Signed)
Pt needs note saying she is unable to have the flu shot with H1N1 because she had a reaction to it.

## 2011-05-22 LAB — DIFFERENTIAL
Lymphocytes Relative: 43
Lymphs Abs: 2.6
Monocytes Absolute: 0.6
Monocytes Relative: 11
Neutro Abs: 2.4

## 2011-05-22 LAB — BASIC METABOLIC PANEL
CO2: 28
Calcium: 9.8
GFR calc Af Amer: >60
GFR calc non Af Amer: 59 — ABNORMAL LOW
Sodium: 141

## 2011-05-22 LAB — CBC
Hemoglobin: 13
RBC: 4.55
WBC: 5.8

## 2011-05-22 LAB — POCT CARDIAC MARKERS
CKMB, poc: <1 — ABNORMAL LOW
Myoglobin, poc: 87.2

## 2011-05-22 LAB — CARDIAC PANEL(CRET KIN+CKTOT+MB+TROPI): CK, MB: 1.8 ng/mL (ref 0.3–4.0)

## 2011-05-23 LAB — GLUCOSE, CAPILLARY
Glucose-Capillary: 83
Glucose-Capillary: 89

## 2011-05-24 LAB — COMPREHENSIVE METABOLIC PANEL
AST: 23 U/L (ref 0–37)
BUN: 8 mg/dL (ref 6–23)
CO2: 28 mEq/L (ref 19–32)
Chloride: 106 mEq/L (ref 96–112)
Creatinine, Ser: 0.96 mg/dL (ref 0.4–1.2)
GFR calc Af Amer: >60 mL/min (ref >60)
GFR calc non Af Amer: >60 mL/min (ref >60)
Total Bilirubin: 0.9 mg/dL (ref 0.3–1.2)

## 2011-05-24 LAB — TROPONIN I: Troponin I: <0.01 ng/mL (ref 0.00–0.06)

## 2011-05-24 LAB — CBC
Hemoglobin: 12.5 g/dL (ref 12.0–15.0)
RBC: 4.23 MIL/uL (ref 3.87–5.11)
RDW: 14.4 % (ref 11.5–15.5)

## 2011-05-24 LAB — LIPID PANEL
HDL: 40 mg/dL (ref >39)
Total CHOL/HDL Ratio: 4.5 RATIO
VLDL: 17 mg/dL (ref 0–40)

## 2011-05-24 LAB — CK TOTAL AND CKMB (NOT AT ARMC): CK, MB: 1.6 ng/mL (ref 0.3–4.0)

## 2011-06-08 ENCOUNTER — Ambulatory Visit: Payer: BC Managed Care – PPO | Admitting: Family Medicine

## 2011-06-11 ENCOUNTER — Encounter: Payer: Self-pay | Admitting: Family Medicine

## 2011-06-12 ENCOUNTER — Ambulatory Visit: Payer: Self-pay | Admitting: Family Medicine

## 2011-06-21 ENCOUNTER — Encounter: Payer: Self-pay | Admitting: Family Medicine

## 2011-06-21 ENCOUNTER — Ambulatory Visit (INDEPENDENT_AMBULATORY_CARE_PROVIDER_SITE_OTHER): Payer: BC Managed Care – PPO | Admitting: Family Medicine

## 2011-06-21 VITALS — BP 138/90 | HR 72 | Temp 98.7°F | Wt 216.6 lb

## 2011-06-21 DIAGNOSIS — E785 Hyperlipidemia, unspecified: Secondary | ICD-10-CM

## 2011-06-21 DIAGNOSIS — M791 Myalgia, unspecified site: Secondary | ICD-10-CM | POA: Insufficient documentation

## 2011-06-21 DIAGNOSIS — M545 Low back pain: Secondary | ICD-10-CM

## 2011-06-21 DIAGNOSIS — IMO0001 Reserved for inherently not codable concepts without codable children: Secondary | ICD-10-CM

## 2011-06-21 DIAGNOSIS — K219 Gastro-esophageal reflux disease without esophagitis: Secondary | ICD-10-CM

## 2011-06-21 DIAGNOSIS — R030 Elevated blood-pressure reading, without diagnosis of hypertension: Secondary | ICD-10-CM

## 2011-06-21 DIAGNOSIS — I10 Essential (primary) hypertension: Secondary | ICD-10-CM

## 2011-06-21 LAB — CBC WITH DIFFERENTIAL/PLATELET
Basophils Relative: 0.5 % (ref 0.0–3.0)
Hemoglobin: 13.6 g/dL (ref 12.0–15.0)
Lymphocytes Relative: 34.5 % (ref 12.0–46.0)
Monocytes Relative: 11.3 % (ref 3.0–12.0)
Neutro Abs: 2.4 10*3/uL (ref 1.4–7.7)
RBC: 4.6 Mil/uL (ref 3.87–5.11)
WBC: 4.7 10*3/uL (ref 4.5–10.5)

## 2011-06-21 LAB — LDL CHOLESTEROL, DIRECT: Direct LDL: 130.5 mg/dL

## 2011-06-21 LAB — BASIC METABOLIC PANEL
GFR: 117.12 mL/min (ref >60.00)
Glucose, Bld: 83 mg/dL (ref 70–99)
Potassium: 4.3 mEq/L (ref 3.5–5.1)
Sodium: 144 mEq/L (ref 135–145)

## 2011-06-21 LAB — HEPATIC FUNCTION PANEL
AST: 25 U/L (ref 0–37)
Alkaline Phosphatase: 60 U/L (ref 39–117)
Bilirubin, Direct: 0 mg/dL (ref 0.0–0.3)
Total Bilirubin: 0.8 mg/dL (ref 0.3–1.2)

## 2011-06-21 LAB — SEDIMENTATION RATE: Sed Rate: 16 mm/hr (ref 0–22)

## 2011-06-21 LAB — CK: Total CK: 164 U/L (ref 7–177)

## 2011-06-21 LAB — POCT URINALYSIS DIPSTICK
Bilirubin, UA: NEGATIVE
Blood, UA: NEGATIVE
Glucose, UA: NEGATIVE
Nitrite, UA: NEGATIVE
Spec Grav, UA: 1.01

## 2011-06-21 LAB — TSH: TSH: 0.57 u[IU]/mL (ref 0.35–5.50)

## 2011-06-21 LAB — LIPID PANEL
Total CHOL/HDL Ratio: 4
VLDL: 28.6 mg/dL (ref 0.0–40.0)

## 2011-06-21 MED ORDER — DEXLANSOPRAZOLE 60 MG PO CPDR: 60.0000 mg | DELAYED_RELEASE_CAPSULE | Freq: Every day | ORAL | Status: DC

## 2011-06-21 MED ORDER — LIDOCAINE 5 % EX PTCH: 1.0000 | MEDICATED_PATCH | Freq: Two times a day (BID) | CUTANEOUS | Status: DC

## 2011-06-21 NOTE — Assessment & Plan Note (Signed)
Monitor bp 2-3 x a week

## 2011-06-21 NOTE — Assessment & Plan Note (Signed)
Worsening Repeat labs Refer to rheum

## 2011-06-21 NOTE — Patient Instructions (Signed)
Myalgia, Adult Myalgia is the medical term for muscle pain. It is a symptom of many things. Nearly everyone at some time in their life has this. The most common cause for muscle pain is overuse or straining and more so when you are not in shape. Injuries and muscle bruises cause myalgias. Muscle pain without a history of injury can also be caused by a virus. It frequently comes along with the flu. Myalgia not caused by muscle strain can be present in a large number of infectious diseases. Some autoimmune diseases like lupus and fibromyalgia can cause muscle pain. Myalgia may be mild, or severe. SYMPTOMS  The symptoms of myalgia are simply muscle pain. Most of the time this is short lived and the pain goes away without treatment. DIAGNOSIS  Myalgia is diagnosed by your caregiver by taking your history. This means you tell him when the problems began, what they are, and what has been happening. If this has not been a long term problem, your caregiver may want to watch for a while to see what will happen. If it has been long term, they may want to do additional testing. TREATMENT  The treatment depends on what the underlying cause of the muscle pain is. Often anti-inflammatory medications will help. HOME CARE INSTRUCTIONS  If the pain in your muscles came from overuse, slow down your activities until the problems go away.   Myalgia from overuse of a muscle can be treated with alternating hot and cold packs on the muscle affected or with cold for the first couple days. If either heat or cold seems to make things worse, stop their use.   Apply ice to the sore area for 15 to 20 minutes, 3 to 4 times per day, while awake for the first 2 days of muscle soreness, or as directed. Put the ice in a plastic bag and place a towel between the bag of ice and your skin.   Only take over-the-counter or prescription medicines for pain, discomfort, or fever as directed by your caregiver.   Regular gentle exercise may  help if you are not active.   Stretching before strenuous exercise can help lower the risk of myalgia. It is normal when beginning an exercise regimen to feel some muscle pain after exercising. Muscles that have not been used frequently will be sore at first. If the pain is extreme, this may mean injury to a muscle.  SEEK MEDICAL CARE IF:  You have an increase in muscle pain that is not relieved with medication.   You begin to run a temperature.   You develop nausea and vomiting.   You develop a stiff and painful neck.   You develop a rash.   You develop muscle pain after a tick bite.   You have continued muscle pain while working out even after you are in good condition.  SEEK IMMEDIATE MEDICAL CARE IF: Any of your problems are getting worse and medications are not helping. MAKE SURE YOU:   Understand these instructions.   Will watch your condition.   Will get help right away if you are not doing well or get worse.  Document Released: 06/28/2006 Document Revised: 04/18/2011 Document Reviewed: 09/17/2006 [employer] Patient Information 2012 [employer]. 

## 2011-06-21 NOTE — Progress Notes (Signed)
  Subjective:    Patient ID: Emily Osborne, female    DOB: Jan 23, 1961, 50 y.o.   MRN: [id number]  HPI Pt here for bp f/u and is also c/o worsening myalgias over the past several months.  No changes in meds etc.  No other complaints.     Review of Systems As above    Objective:   Physical Exam  Constitutional: She is oriented to person, place, and time. She appears well-developed and well-nourished.  Cardiovascular: Normal rate and regular rhythm.   No murmur heard. Pulmonary/Chest: Effort normal and breath sounds normal. No respiratory distress. She has no wheezes. She has no rales. She exhibits no tenderness.  Musculoskeletal: She exhibits no edema and no tenderness.  Neurological: She is alert and oriented to person, place, and time.  Psychiatric: She has a normal mood and affect. Her behavior is normal. Judgment and thought content normal.          Assessment & Plan:

## 2011-06-21 NOTE — Assessment & Plan Note (Signed)
On no meds Check labs today

## 2011-06-22 ENCOUNTER — Ambulatory Visit: Payer: BC Managed Care – PPO

## 2011-06-22 LAB — ANA: Anti Nuclear Antibody(ANA): NEGATIVE

## 2011-07-02 ENCOUNTER — Ambulatory Visit (INDEPENDENT_AMBULATORY_CARE_PROVIDER_SITE_OTHER): Payer: BC Managed Care – PPO | Admitting: *Deleted

## 2011-07-02 DIAGNOSIS — Z111 Encounter for screening for respiratory tuberculosis: Secondary | ICD-10-CM

## 2011-07-05 ENCOUNTER — Ambulatory Visit: Payer: BC Managed Care – PPO

## 2011-08-16 ENCOUNTER — Other Ambulatory Visit: Payer: Self-pay | Admitting: Nurse Practitioner

## 2011-09-18 ENCOUNTER — Other Ambulatory Visit: Payer: Self-pay | Admitting: Family Medicine

## 2011-09-18 DIAGNOSIS — Z1231 Encounter for screening mammogram for malignant neoplasm of breast: Secondary | ICD-10-CM

## 2011-09-21 ENCOUNTER — Encounter: Payer: Self-pay | Admitting: Family Medicine

## 2011-09-21 ENCOUNTER — Ambulatory Visit (INDEPENDENT_AMBULATORY_CARE_PROVIDER_SITE_OTHER): Payer: BC Managed Care – PPO | Admitting: Family Medicine

## 2011-09-21 VITALS — BP 122/80 | HR 76 | Temp 98.7°F | Wt 210.0 lb

## 2011-09-21 DIAGNOSIS — M791 Myalgia, unspecified site: Secondary | ICD-10-CM

## 2011-09-21 DIAGNOSIS — Z8619 Personal history of other infectious and parasitic diseases: Secondary | ICD-10-CM

## 2011-09-21 DIAGNOSIS — E785 Hyperlipidemia, unspecified: Secondary | ICD-10-CM

## 2011-09-21 DIAGNOSIS — Z23 Encounter for immunization: Secondary | ICD-10-CM

## 2011-09-21 DIAGNOSIS — IMO0001 Reserved for inherently not codable concepts without codable children: Secondary | ICD-10-CM

## 2011-09-21 MED ORDER — VALACYCLOVIR HCL 1 G PO TABS: ORAL_TABLET | ORAL | Status: DC

## 2011-09-21 MED ORDER — LIDOCAINE 5 % EX PTCH: 1.0000 | MEDICATED_PATCH | Freq: Two times a day (BID) | CUTANEOUS | Status: DC

## 2011-09-21 NOTE — Patient Instructions (Signed)

## 2011-09-21 NOTE — Progress Notes (Signed)
  Subjective:    Patient ID: Emily Osborne, female    DOB: 09/08/1960, 51 y.o.   MRN: [id number]  HPI  Pt here for f/u on myalgia and she is doing much better.  Only has myalgias on occasion but also has varicose veins in Low ext --- R> L She wonders if that is why she has pains in her legs.  No other complaints.    Review of Systems As above    Objective:   Physical Exam  Constitutional: She is oriented to person, place, and time. She appears well-developed and well-nourished.  Cardiovascular: Normal rate, regular rhythm and normal heart sounds.   Pulmonary/Chest: No respiratory distress.  Musculoskeletal: She exhibits no edema and no tenderness.  Neurological: She is alert and oriented to person, place, and time.  Psychiatric: She has a normal mood and affect. Her behavior is normal. Judgment and thought content normal.          Assessment & Plan:  Myalgias--- better,  Uses lidoderm prn--very rare Hyperlipidemia--- check labs

## 2011-09-21 NOTE — Progress Notes (Signed)
Addended by: Arnette Norris on: 09/21/2011 10:33 AM   Modules accepted: Orders

## 2011-09-25 ENCOUNTER — Ambulatory Visit (HOSPITAL_BASED_OUTPATIENT_CLINIC_OR_DEPARTMENT_OTHER)
Admission: RE | Admit: 2011-09-25 | Discharge: 2011-09-25 | Disposition: A | Discharge status: Home / Self Care | Payer: BC Managed Care – PPO | Source: Ambulatory Visit | Attending: Family Medicine | Admitting: Family Medicine

## 2011-09-25 DIAGNOSIS — Z1231 Encounter for screening mammogram for malignant neoplasm of breast: Secondary | ICD-10-CM | POA: Insufficient documentation

## 2011-10-01 ENCOUNTER — Telehealth: Payer: Self-pay | Admitting: Family Medicine

## 2011-10-01 ENCOUNTER — Other Ambulatory Visit: Payer: Self-pay | Admitting: Family Medicine

## 2011-10-01 DIAGNOSIS — R928 Other abnormal and inconclusive findings on diagnostic imaging of breast: Secondary | ICD-10-CM

## 2011-10-01 NOTE — Telephone Encounter (Signed)
No pending apt---msg left to call the office    KP

## 2011-10-01 NOTE — Telephone Encounter (Signed)
She should be seen---- if she has no fever it is most likely not the flu.

## 2011-10-01 NOTE — Telephone Encounter (Signed)
[facility] Triage Call Report Triage Record Num: [id number] Operator: Arline Asp Loftin Patient Name: Emily Osborne Call Date & Time: 10/01/2011 11:19:49AM Patient Phone: [phone] PCP: Lelon Perla Patient Gender: Female PCP Fax : [phone] Patient DOB: 1960-08-26 Practice Name: [facility] Reason for Call: Caller: [name]/Patient; PCP: Lelon Perla.; CB#: [phone]; Call regarding Flu Sx, Asking for Tamiflu; achy, congestion, cough, runny nose onset 02/09 at 2000. Afebrile but is "chilled." Emergent sx negative. Care advice per "Flu-Like Symptoms" with disp to call provider within 8h due to "new flu-like illness and in high risk group for complications." OFFICE PLEASE CALL [facility] AT [phone] AND ADVISE IF TAMIFLU HAS BEEN CALLED IN. [facility] at [phone]. Protocol(s) Used: Flu-Like Symptoms Recommended Outcome per Protocol: Call Provider within 8 Hours Reason for Outcome: New OR worsening flu-like illness AND in high risk group for complications Care Advice: ~ Use a cool mist humidifier to moisten air. Be sure to clean according to manufacturer's instructions. ~ Rest until temperature returns to normal. Most adults need to drink 6-10 eight-ounce glasses (1.2-2.0 liters) of fluids per day unless previously told to limit fluid intake for other medical reasons. Limit fluids that contain caffeine, sugar or alcohol. Urine will be a very light yellow color when you drink enough fluids. ~ Influenza - Expected Course: - Symptoms start to improve in 3 to 7 days. - Cough and feeling tired (malaise) may continue for several weeks. - Cough and extreme fatigue lasting more than 3 weeks needs medical evaluation. - Remain at home at least 24 hours after being free of fever 100F (37.8C) without the use of fever reducing medication. ~ Antiviral medications - Prescribed medications are available in pills, liquids or inhaled powder that help prevent or lessen  symptoms of viral illnesses. Antivirals are usually given to persons at a higher risk for flu-related complications or who are very ill. Most healthy people do not need to be treated with antiviral drugs. - Recommended medications for this season: oseltamivir (Tamiflu) and zanamivir (Relenza). - Work best when started within the first two days of symptoms. - Speak with your provider as soon as possible if exposed to the flu or if you have new symptoms of the flu

## 2011-10-02 NOTE — Telephone Encounter (Signed)
msg from patient stating she wanted a Rx for Tamiflu.    KP

## 2011-10-02 NOTE — Telephone Encounter (Signed)
msg left to call the office     KP 

## 2011-10-03 ENCOUNTER — Ambulatory Visit (INDEPENDENT_AMBULATORY_CARE_PROVIDER_SITE_OTHER): Payer: BC Managed Care – PPO | Admitting: Family Medicine

## 2011-10-03 ENCOUNTER — Encounter: Payer: Self-pay | Admitting: Family Medicine

## 2011-10-03 ENCOUNTER — Other Ambulatory Visit: Payer: Self-pay | Admitting: Gastroenterology

## 2011-10-03 VITALS — BP 122/82 | HR 73 | Temp 98.3°F | Wt 211.2 lb

## 2011-10-03 DIAGNOSIS — J069 Acute upper respiratory infection, unspecified: Secondary | ICD-10-CM

## 2011-10-03 DIAGNOSIS — R110 Nausea: Secondary | ICD-10-CM

## 2011-10-03 MED ORDER — PROMETHAZINE HCL 25 MG PO TABS: 25.0000 mg | ORAL_TABLET | Freq: Three times a day (TID) | ORAL | Status: DC | PRN

## 2011-10-03 MED ORDER — AZELASTINE-FLUTICASONE 137-50 MCG/ACT NA SUSP: 1.0000 | Freq: Two times a day (BID) | NASAL | Status: DC

## 2011-10-03 NOTE — Progress Notes (Signed)
  Subjective:     Emily Osborne is a 51 y.o. female who presents for evaluation of sinus pain. Symptoms include: congestion, cough, facial pain, headaches, nasal congestion, post nasal drip, sinus pressure and achiness and chills.. Onset of symptoms was 5 days ago. Symptoms have been gradually worsening since that time. Past history is significant for no history of pneumonia or bronchitis. Patient is a non-smoker.  The following portions of the patient's history were reviewed and updated as appropriate: allergies, current medications, past family history, past medical history, past social history, past surgical history and problem list.  Review of Systems Pertinent items are noted in HPI.   Objective:    BP 122/82  Pulse 73  Temp(Src) 98.3 F (36.8 C) (Oral)  Wt 211 lb 3.2 oz (95.8 kg)  SpO2 98% General appearance: alert, cooperative, appears stated age and no distress Ears: normal TM's and external ear canals both ears Nose: green discharge, mild congestion, sinus tenderness bilateral Throat: lips, mucosa, and tongue normal; teeth and gums normal Neck: no adenopathy, no carotid bruit, no JVD, supple, symmetrical, trachea midline and thyroid not enlarged, symmetric, no tenderness/mass/nodules Lungs: clear to auscultation bilaterally Heart: S1, S2 normal    Assessment:    Acute viral sinusitis.    Plan:    Nasal steroids per medication orders. Antihistamines per medication orders. f/u prn

## 2011-10-03 NOTE — Patient Instructions (Signed)

## 2011-10-03 NOTE — Telephone Encounter (Signed)
Called patient and left message to return call, need to know other PPI's she has been on

## 2011-10-03 NOTE — Telephone Encounter (Signed)
Discussed with patient and she stated she was upset and I advised the issues with playing phone tag--I apologized for the inconvenience and advised I would make my manager aware that an apt was not an option when she spoke with [facility] and they advised they would send in Tamiflu and did not. I scheduled and apt today at 10:15        KP

## 2011-10-08 ENCOUNTER — Telehealth: Payer: Self-pay | Admitting: Gastroenterology

## 2011-10-08 NOTE — Telephone Encounter (Signed)
Have not had return call back

## 2011-10-08 NOTE — Telephone Encounter (Signed)
Patient approved from 09/17/2011 -09/16/2012

## 2011-10-08 NOTE — Telephone Encounter (Signed)
Tried to contact patient no answer mailbox full.. Need the information to contact her insurance

## 2011-10-09 ENCOUNTER — Ambulatory Visit
Admission: RE | Admit: 2011-10-09 | Discharge: 2011-10-09 | Disposition: A | Discharge status: Home / Self Care | Payer: BC Managed Care – PPO | Source: Ambulatory Visit | Attending: Family Medicine | Admitting: Family Medicine

## 2011-10-09 DIAGNOSIS — R928 Other abnormal and inconclusive findings on diagnostic imaging of breast: Secondary | ICD-10-CM

## 2011-11-02 ENCOUNTER — Other Ambulatory Visit: Payer: Self-pay | Admitting: Family Medicine

## 2011-12-10 ENCOUNTER — Other Ambulatory Visit: Payer: Self-pay | Admitting: Orthopedic Surgery

## 2011-12-10 DIAGNOSIS — M79606 Pain in leg, unspecified: Secondary | ICD-10-CM

## 2011-12-10 DIAGNOSIS — R531 Weakness: Secondary | ICD-10-CM

## 2011-12-10 DIAGNOSIS — M545 Low back pain: Secondary | ICD-10-CM

## 2011-12-17 ENCOUNTER — Ambulatory Visit
Admission: RE | Admit: 2011-12-17 | Discharge: 2011-12-17 | Disposition: A | Discharge status: Home / Self Care | Payer: BC Managed Care – PPO | Source: Ambulatory Visit | Attending: Orthopedic Surgery | Admitting: Orthopedic Surgery

## 2011-12-17 DIAGNOSIS — M79606 Pain in leg, unspecified: Secondary | ICD-10-CM

## 2011-12-17 DIAGNOSIS — M545 Low back pain: Secondary | ICD-10-CM

## 2011-12-17 DIAGNOSIS — R531 Weakness: Secondary | ICD-10-CM

## 2011-12-26 ENCOUNTER — Other Ambulatory Visit (INDEPENDENT_AMBULATORY_CARE_PROVIDER_SITE_OTHER): Payer: BC Managed Care – PPO

## 2011-12-26 DIAGNOSIS — M791 Myalgia, unspecified site: Secondary | ICD-10-CM

## 2011-12-26 DIAGNOSIS — IMO0001 Reserved for inherently not codable concepts without codable children: Secondary | ICD-10-CM

## 2011-12-26 DIAGNOSIS — E785 Hyperlipidemia, unspecified: Secondary | ICD-10-CM

## 2011-12-26 LAB — LDL CHOLESTEROL, DIRECT: Direct LDL: 139.4 mg/dL

## 2011-12-26 LAB — HEPATIC FUNCTION PANEL
Bilirubin, Direct: 0 mg/dL (ref 0.0–0.3)
Total Protein: 7.4 g/dL (ref 6.0–8.3)

## 2011-12-26 LAB — LIPID PANEL
HDL: 56.1 mg/dL (ref >39.00)
VLDL: 22 mg/dL (ref 0.0–40.0)

## 2011-12-26 LAB — BASIC METABOLIC PANEL
CO2: 29 mEq/L (ref 19–32)
Chloride: 106 mEq/L (ref 96–112)
GFR: 86.8 mL/min (ref >60.00)
Glucose, Bld: 87 mg/dL (ref 70–99)
Potassium: 4 mEq/L (ref 3.5–5.1)
Sodium: 142 mEq/L (ref 135–145)

## 2011-12-26 NOTE — Progress Notes (Signed)
LABS ONLY  

## 2012-04-11 ENCOUNTER — Other Ambulatory Visit: Payer: Self-pay | Admitting: Family Medicine

## 2012-04-11 DIAGNOSIS — N63 Unspecified lump in unspecified breast: Secondary | ICD-10-CM

## 2012-04-17 ENCOUNTER — Other Ambulatory Visit: Payer: Self-pay | Admitting: Family Medicine

## 2012-04-17 ENCOUNTER — Other Ambulatory Visit: Payer: BC Managed Care – PPO

## 2012-04-17 ENCOUNTER — Ambulatory Visit
Admission: RE | Admit: 2012-04-17 | Discharge: 2012-04-17 | Disposition: A | Discharge status: Home / Self Care | Payer: BC Managed Care – PPO | Source: Ambulatory Visit | Attending: Family Medicine | Admitting: Family Medicine

## 2012-04-17 DIAGNOSIS — N63 Unspecified lump in unspecified breast: Secondary | ICD-10-CM

## 2012-05-06 ENCOUNTER — Telehealth: Payer: Self-pay | Admitting: Family Medicine

## 2012-05-06 ENCOUNTER — Encounter: Payer: Self-pay | Admitting: Family Medicine

## 2012-05-06 NOTE — Telephone Encounter (Signed)
Letter left at check in and daughter as been made aware--   KP

## 2012-05-06 NOTE — Telephone Encounter (Signed)
pt wants a note stating she can not take the flu vaccine, please call when ready for pick up

## 2012-05-06 NOTE — Telephone Encounter (Signed)
Flu shot is on her allergy list Note done

## 2012-05-06 NOTE — Telephone Encounter (Signed)
Flu vaccine has made the patient sick in the past. Please advise     KP

## 2012-06-17 ENCOUNTER — Telehealth: Payer: Self-pay

## 2012-06-17 NOTE — Telephone Encounter (Signed)
Pt called left message on triage line stating need to know if she had MMR, and varicella done if so need copy. I called pt to advise my records don't show either. Pt advised me she has had MMR and will get info and call me back and to add varicella to her appt in November. I added varicella and pt will call me back concerning MMR.     MW

## 2012-06-20 ENCOUNTER — Encounter: Payer: Self-pay | Admitting: Family Medicine

## 2012-06-20 ENCOUNTER — Ambulatory Visit (INDEPENDENT_AMBULATORY_CARE_PROVIDER_SITE_OTHER): Payer: BC Managed Care – PPO | Admitting: Family Medicine

## 2012-06-20 VITALS — BP 118/74 | HR 71 | Temp 98.3°F | Ht 65.0 in | Wt 219.0 lb

## 2012-06-20 DIAGNOSIS — Z2911 Encounter for prophylactic immunotherapy for respiratory syncytial virus (RSV): Secondary | ICD-10-CM

## 2012-06-20 DIAGNOSIS — E538 Deficiency of other specified B group vitamins: Secondary | ICD-10-CM

## 2012-06-20 DIAGNOSIS — E785 Hyperlipidemia, unspecified: Secondary | ICD-10-CM

## 2012-06-20 DIAGNOSIS — Z0000 Encounter for general adult medical examination without abnormal findings: Secondary | ICD-10-CM

## 2012-06-20 DIAGNOSIS — J45909 Unspecified asthma, uncomplicated: Secondary | ICD-10-CM

## 2012-06-20 DIAGNOSIS — Z23 Encounter for immunization: Secondary | ICD-10-CM

## 2012-06-20 DIAGNOSIS — Z9109 Other allergy status, other than to drugs and biological substances: Secondary | ICD-10-CM

## 2012-06-20 LAB — POCT URINALYSIS DIPSTICK
Bilirubin, UA: NEGATIVE
Ketones, UA: NEGATIVE
Leukocytes, UA: NEGATIVE
Nitrite, UA: NEGATIVE

## 2012-06-20 LAB — CBC WITH DIFFERENTIAL/PLATELET
Basophils Relative: 0.6 % (ref 0.0–3.0)
Eosinophils Relative: 1.5 % (ref 0.0–5.0)
Hemoglobin: 12.8 g/dL (ref 12.0–15.0)
Lymphocytes Relative: 37.1 % (ref 12.0–46.0)
MCHC: 32.1 g/dL (ref 30.0–36.0)
Monocytes Relative: 10.8 % (ref 3.0–12.0)
Neutro Abs: 2.4 10*3/uL (ref 1.4–7.7)
RBC: 4.52 Mil/uL (ref 3.87–5.11)

## 2012-06-20 LAB — HEPATIC FUNCTION PANEL
ALT: 20 U/L (ref 0–35)
AST: 25 U/L (ref 0–37)
Albumin: 4.1 g/dL (ref 3.5–5.2)
Alkaline Phosphatase: 58 U/L (ref 39–117)
Total Protein: 7.4 g/dL (ref 6.0–8.3)

## 2012-06-20 LAB — BASIC METABOLIC PANEL
CO2: 30 mEq/L (ref 19–32)
Calcium: 9.1 mg/dL (ref 8.4–10.5)
GFR: 81.29 mL/min (ref >60.00)
Sodium: 141 mEq/L (ref 135–145)

## 2012-06-20 LAB — TSH: TSH: 0.59 u[IU]/mL (ref 0.35–5.50)

## 2012-06-20 MED ORDER — VITAMIN B-12 1000 MCG PO TABS: 1000.0000 ug | ORAL_TABLET | Freq: Every day | ORAL | Status: DC

## 2012-06-20 MED ORDER — MOMETASONE FUROATE 50 MCG/ACT NA SUSP: 2.0000 | Freq: Every day | NASAL | Status: DC

## 2012-06-20 NOTE — Progress Notes (Signed)
  Subjective:     Emily Osborne is a 51 y.o. female and is here for a comprehensive physical exam. The patient reports no problems.  History   Social History  . Marital Status: Divorced    Spouse Name: N/A    Number of Children: N/A  . Years of Education: N/A   Occupational History  . Not on file.   Social History Main Topics  . Smoking status: Never Smoker   . Smokeless tobacco: Never Used  . Alcohol Use: No  . Drug Use: No  . Sexually Active: Not Currently -- Female partner(s)   Other Topics Concern  . Not on file   Social History Narrative   Exercise--- 3x a week , gym-----20-30 min   Health Maintenance  Topic Date Due  . Pap Smear  07/22/2012  . Mammogram  04/17/2014  . Tetanus/tdap  09/07/2020  . Colonoscopy  10/05/2020    The following portions of the patient's history were reviewed and updated as appropriate: allergies, current medications, past family history, past medical history, past social history, past surgical history and problem list.  Review of Systems Review of Systems  Constitutional: Negative for activity change, appetite change and fatigue.  HENT: Negative for hearing loss, congestion, tinnitus and ear discharge.  dentist q62m Eyes: Negative for visual disturbance (see optho q1y -- vision corrected to 20/20 with glasses).  Respiratory: Negative for cough, chest tightness and shortness of breath.   Cardiovascular: Negative for chest pain, palpitations and leg swelling.  Gastrointestinal: Negative for abdominal pain, diarrhea, constipation and abdominal distention.  Genitourinary: Negative for urgency, frequency, decreased urine volume and difficulty urinating.  Musculoskeletal: Negative for back pain, arthralgias and gait problem.  Skin: Negative for color change, pallor and rash.  Neurological: Negative for dizziness, light-headedness, numbness and headaches.  Hematological: Negative for adenopathy. Does not bruise/bleed easily.    Psychiatric/Behavioral: Negative for suicidal ideas, confusion, sleep disturbance, self-injury, dysphoric mood, decreased concentration and agitation.       Objective:    BP 118/74  Pulse 71  Temp 98.3 F (36.8 C) (Oral)  Ht 5\' 5"  (1.651 m)  Wt 219 lb (99.338 kg)  BMI 36.44 kg/m2  SpO2 97% General appearance: alert, cooperative, appears stated age and no distress Head: Normocephalic, without obvious abnormality, atraumatic Eyes: conjunctivae/corneas clear. PERRL, EOM's intact. Fundi benign. Ears: normal TM's and external ear canals both ears Nose: Nares normal. Septum midline. Mucosa normal. No drainage or sinus tenderness. Throat: lips, mucosa, and tongue normal; teeth and gums normal Neck: no adenopathy, supple, symmetrical, trachea midline and thyroid not enlarged, symmetric, no tenderness/mass/nodules Back: symmetric, no curvature. ROM normal. No CVA tenderness. Lungs: clear to auscultation bilaterally Breasts: normal appearance, no masses or tenderness Heart: regular rate and rhythm, S1, S2 normal, no murmur, click, rub or gallop Abdomen: soft, non-tender; bowel sounds normal; no masses,  no organomegaly Pelvic: not indicated; status post hysterectomy, negative ROS Extremities: extremities normal, atraumatic, no cyanosis or edema Pulses: 2+ and symmetric Skin: Skin color, texture, turgor normal. No rashes or lesions Lymph nodes: Cervical, supraclavicular, and axillary nodes normal. Neurologic: Alert and oriented X 3, normal strength and tone. Normal symmetric reflexes. Normal coordination and gait psych-- no depression , no anxiety    Assessment:    Healthy female exam.      Plan:  Check labs ghm utd   See After Visit Summary for Counseling Recommendations

## 2012-06-20 NOTE — Assessment & Plan Note (Signed)
con't meds  Check labs 

## 2012-06-20 NOTE — Assessment & Plan Note (Signed)
Stable con't meds 

## 2012-06-20 NOTE — Patient Instructions (Addendum)
Preventive Care for Adults, Female A healthy lifestyle and preventive care can promote health and wellness. Preventive health guidelines for women include the following key practices.  A routine yearly physical is a good way to check with your caregiver about your health and preventive screening. It is a chance to share any concerns and updates on your health, and to receive a thorough exam.  Visit your dentist for a routine exam and preventive care every 6 months. Brush your teeth twice a day and floss once a day. Good oral hygiene prevents tooth decay and gum disease.  The frequency of eye exams is based on your age, health, family medical history, use of contact lenses, and other factors. Follow your caregiver's recommendations for frequency of eye exams.  Eat a healthy diet. Foods like vegetables, fruits, whole grains, low-fat dairy products, and lean protein foods contain the nutrients you need without too many calories. Decrease your intake of foods high in solid fats, added sugars, and salt. Eat the right amount of calories for you.Get information about a proper diet from your caregiver, if necessary.  Regular physical exercise is one of the most important things you can do for your health. Most adults should get at least 150 minutes of moderate-intensity exercise (any activity that increases your heart rate and causes you to sweat) each week. In addition, most adults need muscle-strengthening exercises on 2 or more days a week.  Maintain a healthy weight. The body mass index (BMI) is a screening tool to identify possible weight problems. It provides an estimate of body fat based on height and weight. Your caregiver can help determine your BMI, and can help you achieve or maintain a healthy weight.For adults 20 years and older:  A BMI below 18.5 is considered underweight.  A BMI of 18.5 to 24.9 is normal.  A BMI of 25 to 29.9 is considered overweight.  A BMI of 30 and above is  considered obese.  Maintain normal blood lipids and cholesterol levels by exercising and minimizing your intake of saturated fat. Eat a balanced diet with plenty of fruit and vegetables. Blood tests for lipids and cholesterol should begin at age 20 and be repeated every 5 years. If your lipid or cholesterol levels are high, you are over 50, or you are at high risk for heart disease, you may need your cholesterol levels checked more frequently.Ongoing high lipid and cholesterol levels should be treated with medicines if diet and exercise are not effective.  If you smoke, find out from your caregiver how to quit. If you do not use tobacco, do not start.  If you are pregnant, do not drink alcohol. If you are breastfeeding, be very cautious about drinking alcohol. If you are not pregnant and choose to drink alcohol, do not exceed 1 drink per day. One drink is considered to be 12 ounces (355 mL) of beer, 5 ounces (148 mL) of wine, or 1.5 ounces (44 mL) of liquor.  Avoid use of street drugs. Do not share needles with anyone. Ask for help if you need support or instructions about stopping the use of drugs.  High blood pressure causes heart disease and increases the risk of stroke. Your blood pressure should be checked at least every 1 to 2 years. Ongoing high blood pressure should be treated with medicines if weight loss and exercise are not effective.  If you are 55 to 52 years old, ask your caregiver if you should take aspirin to prevent strokes.  Diabetes   screening involves taking a blood sample to check your fasting blood sugar level. This should be done once every 3 years, after age 45, if you are within normal weight and without risk factors for diabetes. Testing should be considered at a younger age or be carried out more frequently if you are overweight and have at least 1 risk factor for diabetes.  Breast cancer screening is essential preventive care for women. You should practice "breast  self-awareness." This means understanding the normal appearance and feel of your breasts and may include breast self-examination. Any changes detected, no matter how small, should be reported to a caregiver. Women in their 20s and 30s should have a clinical breast exam (CBE) by a caregiver as part of a regular health exam every 1 to 3 years. After age 40, women should have a CBE every year. Starting at age 40, women should consider having a mammography (breast X-ray test) every year. Women who have a family history of breast cancer should talk to their caregiver about genetic screening. Women at a high risk of breast cancer should talk to their caregivers about having magnetic resonance imaging (MRI) and a mammography every year.  The Pap test is a screening test for cervical cancer. A Pap test can show cell changes on the cervix that might become cervical cancer if left untreated. A Pap test is a procedure in which cells are obtained and examined from the lower end of the uterus (cervix).  Women should have a Pap test starting at age 21.  Between ages 21 and 29, Pap tests should be repeated every 2 years.  Beginning at age 30, you should have a Pap test every 3 years as long as the past 3 Pap tests have been normal.  Some women have medical problems that increase the chance of getting cervical cancer. Talk to your caregiver about these problems. It is especially important to talk to your caregiver if a new problem develops soon after your last Pap test. In these cases, your caregiver may recommend more frequent screening and Pap tests.  The above recommendations are the same for women who have or have not gotten the vaccine for human papillomavirus (HPV).  If you had a hysterectomy for a problem that was not cancer or a condition that could lead to cancer, then you no longer need Pap tests. Even if you no longer need a Pap test, a regular exam is a good idea to make sure no other problems are  starting.  If you are between ages 65 and 70, and you have had normal Pap tests going back 10 years, you no longer need Pap tests. Even if you no longer need a Pap test, a regular exam is a good idea to make sure no other problems are starting.  If you have had past treatment for cervical cancer or a condition that could lead to cancer, you need Pap tests and screening for cancer for at least 20 years after your treatment.  If Pap tests have been discontinued, risk factors (such as a new sexual partner) need to be reassessed to determine if screening should be resumed.  The HPV test is an additional test that may be used for cervical cancer screening. The HPV test looks for the virus that can cause the cell changes on the cervix. The cells collected during the Pap test can be tested for HPV. The HPV test could be used to screen women aged 30 years and older, and should   be used in women of any age who have unclear Pap test results. After the age of 30, women should have HPV testing at the same frequency as a Pap test.  Colorectal cancer can be detected and often prevented. Most routine colorectal cancer screening begins at the age of 50 and continues through age 75. However, your caregiver may recommend screening at an earlier age if you have risk factors for colon cancer. On a yearly basis, your caregiver may provide home test kits to check for hidden blood in the stool. Use of a small camera at the end of a tube, to directly examine the colon (sigmoidoscopy or colonoscopy), can detect the earliest forms of colorectal cancer. Talk to your caregiver about this at age 50, when routine screening begins. Direct examination of the colon should be repeated every 5 to 10 years through age 75, unless early forms of pre-cancerous polyps or small growths are found.  Hepatitis C blood testing is recommended for all people born from 1945 through 1965 and any individual with known risks for hepatitis C.  Practice  safe sex. Use condoms and avoid high-risk sexual practices to reduce the spread of sexually transmitted infections (STIs). STIs include gonorrhea, chlamydia, syphilis, trichomonas, herpes, HPV, and human immunodeficiency virus (HIV). Herpes, HIV, and HPV are viral illnesses that have no cure. They can result in disability, cancer, and death. Sexually active women aged 25 and younger should be checked for chlamydia. Older women with new or multiple partners should also be tested for chlamydia. Testing for other STIs is recommended if you are sexually active and at increased risk.  Osteoporosis is a disease in which the bones lose minerals and strength with aging. This can result in serious bone fractures. The risk of osteoporosis can be identified using a bone density scan. Women ages 65 and over and women at risk for fractures or osteoporosis should discuss screening with their caregivers. Ask your caregiver whether you should take a calcium supplement or vitamin D to reduce the rate of osteoporosis.  Menopause can be associated with physical symptoms and risks. Hormone replacement therapy is available to decrease symptoms and risks. You should talk to your caregiver about whether hormone replacement therapy is right for you.  Use sunscreen with sun protection factor (SPF) of 30 or more. Apply sunscreen liberally and repeatedly throughout the day. You should seek shade when your shadow is shorter than you. Protect yourself by wearing long sleeves, pants, a wide-brimmed hat, and sunglasses year round, whenever you are outdoors.  Once a month, do a whole body skin exam, using a mirror to look at the skin on your back. Notify your caregiver of new moles, moles that have irregular borders, moles that are larger than a pencil eraser, or moles that have changed in shape or color.  Stay current with required immunizations.  Influenza. You need a dose every fall (or winter). The composition of the flu vaccine  changes each year, so being vaccinated once is not enough.  Pneumococcal polysaccharide. You need 1 to 2 doses if you smoke cigarettes or if you have certain chronic medical conditions. You need 1 dose at age 65 (or older) if you have never been vaccinated.  Tetanus, diphtheria, pertussis (Tdap, Td). Get 1 dose of Tdap vaccine if you are younger than age 65, are over 65 and have contact with an infant, are a healthcare worker, are pregnant, or simply want to be protected from whooping cough. After that, you need a Td   booster dose every 10 years. Consult your caregiver if you have not had at least 3 tetanus and diphtheria-containing shots sometime in your life or have a deep or dirty wound.  HPV. You need this vaccine if you are a woman age 26 or younger. The vaccine is given in 3 doses over 6 months.  Measles, mumps, rubella (MMR). You need at least 1 dose of MMR if you were born in 1957 or later. You may also need a second dose.  Meningococcal. If you are age 19 to 21 and a first-year college student living in a residence hall, or have one of several medical conditions, you need to get vaccinated against meningococcal disease. You may also need additional booster doses.  Zoster (shingles). If you are age 60 or older, you should get this vaccine.  Varicella (chickenpox). If you have never had chickenpox or you were vaccinated but received only 1 dose, talk to your caregiver to find out if you need this vaccine.  Hepatitis A. You need this vaccine if you have a specific risk factor for hepatitis A virus infection or you simply wish to be protected from this disease. The vaccine is usually given as 2 doses, 6 to 18 months apart.  Hepatitis B. You need this vaccine if you have a specific risk factor for hepatitis B virus infection or you simply wish to be protected from this disease. The vaccine is given in 3 doses, usually over 6 months. Preventive Services / Frequency Ages 19 to 39  Blood  pressure check.** / Every 1 to 2 years.  Lipid and cholesterol check.** / Every 5 years beginning at age 20.  Clinical breast exam.** / Every 3 years for women in their 20s and 30s.  Pap test.** / Every 2 years from ages 21 through 29. Every 3 years starting at age 30 through age 65 or 70 with a history of 3 consecutive normal Pap tests.  HPV screening.** / Every 3 years from ages 30 through ages 65 to 70 with a history of 3 consecutive normal Pap tests.  Hepatitis C blood test.** / For any individual with known risks for hepatitis C.  Skin self-exam. / Monthly.  Influenza immunization.** / Every year.  Pneumococcal polysaccharide immunization.** / 1 to 2 doses if you smoke cigarettes or if you have certain chronic medical conditions.  Tetanus, diphtheria, pertussis (Tdap, Td) immunization. / A one-time dose of Tdap vaccine. After that, you need a Td booster dose every 10 years.  HPV immunization. / 3 doses over 6 months, if you are 26 and younger.  Measles, mumps, rubella (MMR) immunization. / You need at least 1 dose of MMR if you were born in 1957 or later. You may also need a second dose.  Meningococcal immunization. / 1 dose if you are age 19 to 21 and a first-year college student living in a residence hall, or have one of several medical conditions, you need to get vaccinated against meningococcal disease. You may also need additional booster doses.  Varicella immunization.** / Consult your caregiver.  Hepatitis A immunization.** / Consult your caregiver. 2 doses, 6 to 18 months apart.  Hepatitis B immunization.** / Consult your caregiver. 3 doses usually over 6 months. Ages 40 to 64  Blood pressure check.** / Every 1 to 2 years.  Lipid and cholesterol check.** / Every 5 years beginning at age 20.  Clinical breast exam.** / Every year after age 40.  Mammogram.** / Every year beginning at age 40   and continuing for as long as you are in good health. Consult with your  caregiver.  Pap test.** / Every 3 years starting at age 30 through age 65 or 70 with a history of 3 consecutive normal Pap tests.  HPV screening.** / Every 3 years from ages 30 through ages 65 to 70 with a history of 3 consecutive normal Pap tests.  Fecal occult blood test (FOBT) of stool. / Every year beginning at age 50 and continuing until age 75. You may not need to do this test if you get a colonoscopy every 10 years.  Flexible sigmoidoscopy or colonoscopy.** / Every 5 years for a flexible sigmoidoscopy or every 10 years for a colonoscopy beginning at age 50 and continuing until age 75.  Hepatitis C blood test.** / For all people born from 1945 through 1965 and any individual with known risks for hepatitis C.  Skin self-exam. / Monthly.  Influenza immunization.** / Every year.  Pneumococcal polysaccharide immunization.** / 1 to 2 doses if you smoke cigarettes or if you have certain chronic medical conditions.  Tetanus, diphtheria, pertussis (Tdap, Td) immunization.** / A one-time dose of Tdap vaccine. After that, you need a Td booster dose every 10 years.  Measles, mumps, rubella (MMR) immunization. / You need at least 1 dose of MMR if you were born in 1957 or later. You may also need a second dose.  Varicella immunization.** / Consult your caregiver.  Meningococcal immunization.** / Consult your caregiver.  Hepatitis A immunization.** / Consult your caregiver. 2 doses, 6 to 18 months apart.  Hepatitis B immunization.** / Consult your caregiver. 3 doses, usually over 6 months. Ages 65 and over  Blood pressure check.** / Every 1 to 2 years.  Lipid and cholesterol check.** / Every 5 years beginning at age 20.  Clinical breast exam.** / Every year after age 40.  Mammogram.** / Every year beginning at age 40 and continuing for as long as you are in good health. Consult with your caregiver.  Pap test.** / Every 3 years starting at age 30 through age 65 or 70 with a 3  consecutive normal Pap tests. Testing can be stopped between 65 and 70 with 3 consecutive normal Pap tests and no abnormal Pap or HPV tests in the past 10 years.  HPV screening.** / Every 3 years from ages 30 through ages 65 or 70 with a history of 3 consecutive normal Pap tests. Testing can be stopped between 65 and 70 with 3 consecutive normal Pap tests and no abnormal Pap or HPV tests in the past 10 years.  Fecal occult blood test (FOBT) of stool. / Every year beginning at age 50 and continuing until age 75. You may not need to do this test if you get a colonoscopy every 10 years.  Flexible sigmoidoscopy or colonoscopy.** / Every 5 years for a flexible sigmoidoscopy or every 10 years for a colonoscopy beginning at age 50 and continuing until age 75.  Hepatitis C blood test.** / For all people born from 1945 through 1965 and any individual with known risks for hepatitis C.  Osteoporosis screening.** / A one-time screening for women ages 65 and over and women at risk for fractures or osteoporosis.  Skin self-exam. / Monthly.  Influenza immunization.** / Every year.  Pneumococcal polysaccharide immunization.** / 1 dose at age 65 (or older) if you have never been vaccinated.  Tetanus, diphtheria, pertussis (Tdap, Td) immunization. / A one-time dose of Tdap vaccine if you are over   65 and have contact with an infant, are a healthcare worker, or simply want to be protected from whooping cough. After that, you need a Td booster dose every 10 years.  Varicella immunization.** / Consult your caregiver.  Meningococcal immunization.** / Consult your caregiver.  Hepatitis A immunization.** / Consult your caregiver. 2 doses, 6 to 18 months apart.  Hepatitis B immunization.** / Check with your caregiver. 3 doses, usually over 6 months. ** Family history and personal history of risk and conditions may change your caregiver's recommendations. Document Released: 10/02/2001 Document Revised: 10/29/2011  Document Reviewed: 01/01/2011 [employer] Patient Information 2013 [employer].  

## 2012-06-24 LAB — MEASLES/MUMPS/RUBELLA IMMUNITY: Rubeola IgG: 5.71 {ISR} — ABNORMAL HIGH

## 2012-06-27 LAB — VITAMIN D 1,25 DIHYDROXY
Vitamin D 1, 25 (OH)2 Total: 64 pg/mL (ref 18–72)
Vitamin D3 1, 25 (OH)2: 64 pg/mL

## 2012-06-30 ENCOUNTER — Ambulatory Visit: Payer: BC Managed Care – PPO

## 2012-07-07 ENCOUNTER — Ambulatory Visit (INDEPENDENT_AMBULATORY_CARE_PROVIDER_SITE_OTHER): Payer: BC Managed Care – PPO

## 2012-07-07 DIAGNOSIS — Z111 Encounter for screening for respiratory tuberculosis: Secondary | ICD-10-CM

## 2012-07-09 ENCOUNTER — Ambulatory Visit: Payer: BC Managed Care – PPO

## 2012-07-09 LAB — TB SKIN TEST: Induration: 0 mm

## 2012-07-25 ENCOUNTER — Telehealth: Payer: Self-pay | Admitting: Family Medicine

## 2012-07-25 NOTE — Telephone Encounter (Signed)
Pt would like to see if there were any samples for the inhaler and advir, pls call pt.

## 2012-07-25 NOTE — Telephone Encounter (Signed)
Patient is currently taking  advair 250-50 please advise if it is ok to give her a sample, last seen 06/20/12.       KP

## 2012-07-25 NOTE — Telephone Encounter (Signed)
Sample OK

## 2012-07-25 NOTE — Telephone Encounter (Signed)
Msg left advising sample ready for pick up.      KP

## 2012-10-29 ENCOUNTER — Ambulatory Visit: Payer: BC Managed Care – PPO | Admitting: Family Medicine

## 2012-11-02 ENCOUNTER — Other Ambulatory Visit: Payer: Self-pay | Admitting: Family Medicine

## 2012-11-04 ENCOUNTER — Ambulatory Visit (INDEPENDENT_AMBULATORY_CARE_PROVIDER_SITE_OTHER): Payer: BC Managed Care – PPO | Admitting: Family Medicine

## 2012-11-04 ENCOUNTER — Encounter: Payer: Self-pay | Admitting: Family Medicine

## 2012-11-04 VITALS — BP 110/76 | HR 75 | Wt 216.0 lb

## 2012-11-04 DIAGNOSIS — Q828 Other specified congenital malformations of skin: Secondary | ICD-10-CM

## 2012-11-04 NOTE — Progress Notes (Signed)
  Skin Tag Removal Procedure Note  Pre-operative Diagnosis: Classic skin tags (acrochordon)  Post-operative Diagnosis: Classic skin tags (acrochordon)  Locations:lateral neck  Indications: irritation  Anesthesia: Lidocaine 1% without epinephrine without added sodium bicarbonate  Procedure Details  The risks (including bleeding and infection) and benefits of the procedure and Written informed consent obtained. Using sterile iris scissors, multiple skin tags were snipped off at their bases after cleansing with Betadine.  Bleeding was controlled by pressure and silver nitrate.   Findings: Pathognomonic benign lesions  not sent for pathological exam.  Condition: Stable  Complications: none.  Plan: 1. Instructed to keep the wounds dry and covered for 24-48h and clean thereafter. 2. Warning signs of infection were reviewed.   3. Recommended that the patient use none as needed for pain.  4. Return as needed.

## 2012-11-06 ENCOUNTER — Telehealth: Payer: Self-pay | Admitting: Gastroenterology

## 2012-11-06 NOTE — Telephone Encounter (Signed)
LM for pt to return my call

## 2012-11-07 NOTE — Telephone Encounter (Signed)
Called pt. Gave pt samples of dexilant until prior auth is completed

## 2012-11-11 NOTE — Telephone Encounter (Signed)
dexilant approved 

## 2013-04-03 ENCOUNTER — Telehealth: Payer: Self-pay | Admitting: Family Medicine

## 2013-04-03 NOTE — Telephone Encounter (Signed)
Patient would like letter stating she cannot take the flu shot. She will come pick up.

## 2013-04-06 NOTE — Telephone Encounter (Signed)
Written and placed for signature.

## 2013-04-07 NOTE — Telephone Encounter (Signed)
Placed at the front desk

## 2013-05-28 ENCOUNTER — Telehealth: Payer: Self-pay | Admitting: *Deleted

## 2013-05-28 NOTE — Telephone Encounter (Signed)
Called pts cell phone, left message on voice mail that [facility] paperwork has been completed and is up front.

## 2013-05-29 ENCOUNTER — Other Ambulatory Visit: Payer: Self-pay | Admitting: Family Medicine

## 2013-06-23 ENCOUNTER — Other Ambulatory Visit: Payer: Self-pay | Admitting: Family Medicine

## 2013-06-24 NOTE — Telephone Encounter (Signed)
Last seen 11/04/12 and filled 06/21/11. Please advise      KP

## 2013-07-13 ENCOUNTER — Ambulatory Visit (INDEPENDENT_AMBULATORY_CARE_PROVIDER_SITE_OTHER): Payer: BC Managed Care – PPO | Admitting: *Deleted

## 2013-07-13 DIAGNOSIS — Z111 Encounter for screening for respiratory tuberculosis: Secondary | ICD-10-CM

## 2013-07-14 ENCOUNTER — Encounter: Payer: Self-pay | Admitting: Family Medicine

## 2013-07-14 ENCOUNTER — Ambulatory Visit (INDEPENDENT_AMBULATORY_CARE_PROVIDER_SITE_OTHER): Payer: BC Managed Care – PPO | Admitting: Family Medicine

## 2013-07-14 VITALS — BP 132/76 | HR 70 | Resp 16 | Wt 225.4 lb

## 2013-07-14 DIAGNOSIS — J45909 Unspecified asthma, uncomplicated: Secondary | ICD-10-CM

## 2013-07-14 DIAGNOSIS — J069 Acute upper respiratory infection, unspecified: Secondary | ICD-10-CM

## 2013-07-14 DIAGNOSIS — M25512 Pain in left shoulder: Secondary | ICD-10-CM

## 2013-07-14 DIAGNOSIS — M2550 Pain in unspecified joint: Secondary | ICD-10-CM

## 2013-07-14 DIAGNOSIS — Z780 Asymptomatic menopausal state: Secondary | ICD-10-CM

## 2013-07-14 DIAGNOSIS — F411 Generalized anxiety disorder: Secondary | ICD-10-CM

## 2013-07-14 DIAGNOSIS — M25519 Pain in unspecified shoulder: Secondary | ICD-10-CM

## 2013-07-14 LAB — HEPATIC FUNCTION PANEL
ALT: 24 U/L (ref 0–35)
AST: 26 U/L (ref 0–37)
Alkaline Phosphatase: 55 U/L (ref 39–117)
Bilirubin, Direct: 0.1 mg/dL (ref 0.0–0.3)
Total Bilirubin: 0.5 mg/dL (ref 0.3–1.2)
Total Protein: 7.6 g/dL (ref 6.0–8.3)

## 2013-07-14 LAB — CBC WITH DIFFERENTIAL/PLATELET
Basophils Relative: 0.4 % (ref 0.0–3.0)
Eosinophils Relative: 2 % (ref 0.0–5.0)
Lymphocytes Relative: 36.2 % (ref 12.0–46.0)
MCV: 85.8 fl (ref 78.0–100.0)
Monocytes Absolute: 0.5 10*3/uL (ref 0.1–1.0)
Monocytes Relative: 9.6 % (ref 3.0–12.0)
Neutrophils Relative %: 51.8 % (ref 43.0–77.0)
RBC: 4.75 Mil/uL (ref 3.87–5.11)
WBC: 5.2 10*3/uL (ref 4.5–10.5)

## 2013-07-14 LAB — BASIC METABOLIC PANEL
Chloride: 107 mEq/L (ref 96–112)
Creatinine, Ser: 0.9 mg/dL (ref 0.4–1.2)
GFR: 81.97 mL/min (ref >60.00)

## 2013-07-14 MED ORDER — HYDROCODONE-ACETAMINOPHEN 5-300 MG PO TABS: ORAL_TABLET | ORAL | Status: DC

## 2013-07-14 MED ORDER — CHOLECALCIFEROL 25 MCG (1000 UT) PO CAPS: 1000.0000 [IU] | ORAL_CAPSULE | Freq: Every day | ORAL | Status: DC

## 2013-07-14 MED ORDER — B-12 1000 MCG SL SUBL: SUBLINGUAL_TABLET | SUBLINGUAL | Status: DC

## 2013-07-14 MED ORDER — AZELASTINE-FLUTICASONE 137-50 MCG/ACT NA SUSP: 1.0000 | Freq: Two times a day (BID) | NASAL | Status: DC

## 2013-07-14 MED ORDER — ALPRAZOLAM 0.25 MG PO TABS: 0.2500 mg | ORAL_TABLET | Freq: Three times a day (TID) | ORAL | Status: DC | PRN

## 2013-07-14 MED ORDER — ALBUTEROL SULFATE HFA 108 (90 BASE) MCG/ACT IN AERS: INHALATION_SPRAY | RESPIRATORY_TRACT | Status: DC

## 2013-07-14 MED ORDER — CALCIUM CITRATE-VITAMIN D3 1000-400 PO LIQD: ORAL | Status: DC

## 2013-07-14 NOTE — Progress Notes (Signed)
Pre visit review using our clinic review tool, if applicable. No additional management support is needed unless otherwise documented below in the visit note. 

## 2013-07-14 NOTE — Patient Instructions (Signed)
   Shoulder Pain The shoulder is the joint that connects your arms to your body. The bones that form the shoulder joint include the upper arm bone (humerus), the shoulder blade (scapula), and the collarbone (clavicle). The top of the humerus is shaped like a ball and fits into a rather flat socket on the scapula (glenoid cavity). A combination of muscles and strong, fibrous tissues that connect muscles to bones (tendons) support your shoulder joint and hold the ball in the socket. Small, fluid-filled sacs (bursae) are located in different areas of the joint. They act as cushions between the bones and the overlying soft tissues and help reduce friction between the gliding tendons and the bone as you move your arm. Your shoulder joint allows a wide range of motion in your arm. This range of motion allows you to do things like scratch your back or throw a ball. However, this range of motion also makes your shoulder more prone to pain from overuse and injury. Causes of shoulder pain can originate from both injury and overuse and usually can be grouped in the following four categories:  Redness, swelling, and pain (inflammation) of the tendon (tendinitis) or the bursae (bursitis).  Instability, such as a dislocation of the joint.  Inflammation of the joint (arthritis).  Broken bone (fracture). HOME CARE INSTRUCTIONS   Apply ice to the sore area.  Put ice in a plastic bag.  Place a towel between your skin and the bag.  Leave the ice on for 15-20 minutes, 03-04 times per day for the first 2 days.  Stop using cold packs if they do not help with the pain.  If you have a shoulder sling or immobilizer, wear it as long as your caregiver instructs. Only remove it to shower or bathe. Move your arm as little as possible, but keep your hand moving to prevent swelling.  Squeeze a soft ball or foam pad as much as possible to help prevent swelling.  Only take over-the-counter or prescription medicines for  pain, discomfort, or fever as directed by your caregiver. SEEK MEDICAL CARE IF:   Your shoulder pain increases, or new pain develops in your arm, hand, or fingers.  Your hand or fingers become cold and numb.  Your pain is not relieved with medicines. SEEK IMMEDIATE MEDICAL CARE IF:   Your arm, hand, or fingers are numb or tingling.  Your arm, hand, or fingers are significantly swollen or turn white or blue. MAKE SURE YOU:   Understand these instructions.  Will watch your condition.  Will get help right away if you are not doing well or get worse. Document Released: 05/16/2005 Document Revised: 04/30/2012 Document Reviewed: 07/21/2011 Exit[employer] Patient Information 2014 [employer].  

## 2013-07-14 NOTE — Assessment & Plan Note (Signed)
Pt will f/u with ortho about shoulder Ultram for pain prn Check labs at pt request

## 2013-07-14 NOTE — Progress Notes (Signed)
  Subjective:    Patient ID: Emily Osborne, female    DOB: 03-09-1961, 52 y.o.   MRN: [id number]  HPI Pt here c/o joint pains that have worsened in shoulders, elbows , and hands.  She was treated for bursitis in R shoulder by ortho. Pt is having trouble sleeping because on pain.   Review of Systems As above    Objective:   Physical Exam BP 132/76  Pulse 70  Resp 16  Wt 225 lb 6.4 oz (102.241 kg)  SpO2 97% General appearance: alert, cooperative, appears stated age and mild distress Lungs: clear to auscultation bilaterally Heart: S1, S2 normal Extremities: + R shoulder pain and pain in hands, from with pain        Assessment & Plan:

## 2013-07-15 ENCOUNTER — Encounter: Payer: Self-pay | Admitting: *Deleted

## 2013-07-15 LAB — TB SKIN TEST
Induration: 0 mm
TB Skin Test: NEGATIVE

## 2013-07-23 ENCOUNTER — Telehealth: Payer: Self-pay | Admitting: *Deleted

## 2013-07-23 NOTE — Telephone Encounter (Signed)
Prior authorization approved for Dymista nasal spray. Effective dates 06/22/2013-07/22/2014. Case ID# [id number].

## 2013-09-01 ENCOUNTER — Telehealth: Payer: Self-pay

## 2013-09-01 NOTE — Telephone Encounter (Signed)
Message left for call back.  PAP CCS- 10/05/10-Benign polyp MMg-04/17/12-Neg Flu Tdap-09/07/10

## 2013-09-02 ENCOUNTER — Ambulatory Visit (INDEPENDENT_AMBULATORY_CARE_PROVIDER_SITE_OTHER): Payer: BC Managed Care – PPO | Admitting: Family Medicine

## 2013-09-02 ENCOUNTER — Other Ambulatory Visit: Payer: Self-pay | Admitting: Family Medicine

## 2013-09-02 ENCOUNTER — Encounter: Payer: Self-pay | Admitting: Lab

## 2013-09-02 ENCOUNTER — Encounter: Payer: Self-pay | Admitting: Family Medicine

## 2013-09-02 VITALS — BP 112/78 | HR 65 | Temp 98.2°F | Ht 65.0 in | Wt 226.0 lb

## 2013-09-02 DIAGNOSIS — Z1239 Encounter for other screening for malignant neoplasm of breast: Secondary | ICD-10-CM

## 2013-09-02 DIAGNOSIS — E2839 Other primary ovarian failure: Secondary | ICD-10-CM

## 2013-09-02 DIAGNOSIS — E669 Obesity, unspecified: Secondary | ICD-10-CM | POA: Insufficient documentation

## 2013-09-02 DIAGNOSIS — R609 Edema, unspecified: Secondary | ICD-10-CM

## 2013-09-02 DIAGNOSIS — Z1231 Encounter for screening mammogram for malignant neoplasm of breast: Secondary | ICD-10-CM

## 2013-09-02 DIAGNOSIS — J45909 Unspecified asthma, uncomplicated: Secondary | ICD-10-CM

## 2013-09-02 DIAGNOSIS — K219 Gastro-esophageal reflux disease without esophagitis: Secondary | ICD-10-CM

## 2013-09-02 DIAGNOSIS — E785 Hyperlipidemia, unspecified: Secondary | ICD-10-CM

## 2013-09-02 DIAGNOSIS — Z0000 Encounter for general adult medical examination without abnormal findings: Secondary | ICD-10-CM

## 2013-09-02 LAB — CBC WITH DIFFERENTIAL/PLATELET
BASOS ABS: 0 10*3/uL (ref 0.0–0.1)
BASOS PCT: 0.5 % (ref 0.0–3.0)
Eosinophils Absolute: 0.1 10*3/uL (ref 0.0–0.7)
Eosinophils Relative: 1.8 % (ref 0.0–5.0)
HCT: 39.4 % (ref 36.0–46.0)
HEMOGLOBIN: 13.3 g/dL (ref 12.0–15.0)
LYMPHS PCT: 41.6 % (ref 12.0–46.0)
Lymphs Abs: 2.3 10*3/uL (ref 0.7–4.0)
MCHC: 33.8 g/dL (ref 30.0–36.0)
MCV: 85.5 fl (ref 78.0–100.0)
MONOS PCT: 10.1 % (ref 3.0–12.0)
Monocytes Absolute: 0.6 10*3/uL (ref 0.1–1.0)
NEUTROS ABS: 2.6 10*3/uL (ref 1.4–7.7)
Neutrophils Relative %: 46 % (ref 43.0–77.0)
Platelets: 263 10*3/uL (ref 150.0–400.0)
RBC: 4.61 Mil/uL (ref 3.87–5.11)
RDW: 14.9 % — AB (ref 11.5–14.6)
WBC: 5.6 10*3/uL (ref 4.5–10.5)

## 2013-09-02 LAB — HM PAP SMEAR

## 2013-09-02 LAB — POCT URINALYSIS DIPSTICK
BILIRUBIN UA: NEGATIVE
Blood, UA: NEGATIVE
GLUCOSE UA: NEGATIVE
KETONES UA: NEGATIVE
Leukocytes, UA: NEGATIVE
NITRITE UA: NEGATIVE
PH UA: 6.5
Protein, UA: NEGATIVE
Spec Grav, UA: <=1.005
Urobilinogen, UA: 0.2

## 2013-09-02 LAB — BASIC METABOLIC PANEL
BUN: 12 mg/dL (ref 6–23)
CALCIUM: 9.2 mg/dL (ref 8.4–10.5)
CO2: 28 mEq/L (ref 19–32)
Chloride: 106 mEq/L (ref 96–112)
Creatinine, Ser: 0.9 mg/dL (ref 0.4–1.2)
GFR: 86.24 mL/min (ref >60.00)
Glucose, Bld: 71 mg/dL (ref 70–99)
Potassium: 3.8 mEq/L (ref 3.5–5.1)
SODIUM: 140 meq/L (ref 135–145)

## 2013-09-02 LAB — TSH: TSH: 0.32 u[IU]/mL — AB (ref 0.35–5.50)

## 2013-09-02 LAB — HEPATIC FUNCTION PANEL
ALBUMIN: 3.9 g/dL (ref 3.5–5.2)
ALK PHOS: 62 U/L (ref 39–117)
ALT: 24 U/L (ref 0–35)
AST: 23 U/L (ref 0–37)
Bilirubin, Direct: 0.1 mg/dL (ref 0.0–0.3)
Total Bilirubin: 0.6 mg/dL (ref 0.3–1.2)
Total Protein: 7.5 g/dL (ref 6.0–8.3)

## 2013-09-02 LAB — LIPID PANEL
Cholesterol: 193 mg/dL (ref 0–200)
HDL: 50.3 mg/dL (ref >39.00)
LDL Cholesterol: 122 mg/dL — ABNORMAL HIGH (ref 0–99)
Total CHOL/HDL Ratio: 4
Triglycerides: 103 mg/dL (ref 0.0–149.0)
VLDL: 20.6 mg/dL (ref 0.0–40.0)

## 2013-09-02 MED ORDER — NONFORMULARY OR COMPOUNDED ITEM: Status: DC

## 2013-09-02 NOTE — Progress Notes (Signed)
Subjective:     Emily Osborne is a 53 y.o. female and is here for a comprehensive physical exam. The patient reports no new problems.  History   Social History  . Marital Status: Divorced    Spouse Name: N/A    Number of Children: N/A  . Years of Education: N/A   Occupational History  . Not on file.   Social History Main Topics  . Smoking status: Never Smoker   . Smokeless tobacco: Never Used  . Alcohol Use: No  . Drug Use: No  . Sexual Activity: Not Currently    Partners: Male   Other Topics Concern  . Not on file   Social History Narrative   Exercise--- 3x a week , gym-----20-30 min   Health Maintenance  Topic Date Due  . Pap Smear  07/22/2012  . Mammogram  04/17/2014  . Tetanus/tdap  09/07/2020  . Colonoscopy  10/05/2020    The following portions of the patient's history were reviewed and updated as appropriate:  She  has a past medical history of Asthma; Fibromyalgia; IBS (irritable bowel syndrome); GERD (gastroesophageal reflux disease); Hyperlipidemia; Hypertension; Personal history of other diseases of digestive system; Other acute reactions to stress; Pain in joint, upper arm; Pain in joint, shoulder region; Elevated blood pressure reading without diagnosis of hypertension; Edema; Pain in limb; Herpes zoster without mention of complication; Nontraumatic rupture of Achilles tendon; and Ganglion of joint. She  does not have any pertinent problems on file. She  has past surgical history that includes Abdominal hysterectomy; Tubal ligation; Foot surgery; Achilles tendon repair (2005); and Cystectomy. Her family history includes Cancer in her brother; Coronary artery disease in an other family member; Diabetes in her father; Heart disease in her father; Hyperlipidemia in an other family member; Hypertension in her mother and another family member; Kidney disease in her father and another family member; Leukemia in her brother; Lupus in her daughter; Pernicious anemia  in her sister. She  reports that she has never smoked. She has never used smokeless tobacco. She reports that she does not drink alcohol or use illicit drugs. She has a current medication list which includes the following prescription(s): acetaminophen, albuterol, alprazolam, azelastine-fluticasone, calcium citrate-vitamin d3, cholecalciferol, b-12, cyclobenzaprine, dexilant, fexofenadine, flax seed oil, fluticasone-salmeterol, glucose blood, hydrocodone-acetaminophen, lidoderm, and valacyclovir. Current Outpatient Prescriptions on File Prior to Visit  Medication Sig Dispense Refill  . acetaminophen (TYLENOL) 500 MG tablet Take 500 mg by mouth every 6 (six) hours as needed.        Marland Kitchen albuterol (PROAIR HFA) 108 (90 BASE) MCG/ACT inhaler USE 2 PUFFS EVERY 4 HOURS AS NEEDED FOR COUGH, WHEEZE OR SHORTNESS OF BREATH.  8.5 each  2  . ALPRAZolam (XANAX) 0.25 MG tablet Take 1 tablet (0.25 mg total) by mouth 3 (three) times daily as needed.  30 tablet  1  . Azelastine-Fluticasone (DYMISTA) 137-50 MCG/ACT SUSP Place 1 spray into the nose 2 (two) times daily.  1 Bottle  5  . Calcium Citrate-Vitamin D3 1000-400 LIQD 1 po qd      . Cholecalciferol ([employer] VITAMIN D3) 1000 UNITS capsule Take 1 capsule (1,000 Units total) by mouth daily.      . Cyanocobalamin (B-12) 1000 MCG SUBL 1 sl qd  30 each    . cyclobenzaprine (FLEXERIL) 10 MG tablet Take 10 mg by mouth 3 (three) times daily as needed.        Marland Kitchen DEXILANT 60 MG capsule TAKE 1 CAPSULE BY MOUTH 30 MIN  PRIOR TO BREAKFAST  30 capsule  11  . fexofenadine (ALLEGRA) 180 MG tablet Take 180 mg by mouth daily.        . Flaxseed, Linseed, (FLAX SEED OIL) 1000 MG CAPS Take 1 capsule by mouth daily.        . Fluticasone-Salmeterol (ADVAIR DISKUS) 250-50 MCG/DOSE AEPB Inhale 1 puff into the lungs 2 (two) times daily.        Marland Kitchen glucose blood (FREESTYLE LITE) test strip 1 each by Other route as needed. Use as instructed       . Hydrocodone-Acetaminophen (VICODIN) 5-300 MG TABS 1  po q6h prn pain  30 each  0  . LIDODERM 5 % APPLY 1 PATCH ONTO SKIN EVERY 12 HOURS REMOVE AND DISCARD PATCH WITHIN 12 HOURS OR AS DIRECTED BY MD  30 patch  0  . valACYclovir (VALTREX) 1000 MG tablet 2 po bid x 1 day and as directed  30 tablet  2   No current facility-administered medications on file prior to visit.   She is allergic to aspirin; doxycycline; ibuprofen; influenza a (h1n1) monoval vac; metronidazole; nsaids; ranitidine hcl; sulfamethoxazole-trimethoprim; and tramadol hcl..  Review of Systems Review of Systems  Constitutional: Negative for activity change, appetite change and fatigue.  HENT: Negative for hearing loss, congestion, tinnitus and ear discharge.  dentist q44m Eyes: Negative for visual disturbance (see optho q1y -- vision corrected to 20/20 with glasses).  Respiratory: Negative for cough, chest tightness and shortness of breath.   Cardiovascular: Negative for chest pain, palpitations and leg swelling.  Gastrointestinal: Negative for abdominal pain, diarrhea, constipation and abdominal distention.  Genitourinary: Negative for urgency, frequency, decreased urine volume and difficulty urinating.  Musculoskeletal: Negative for back pain, arthralgias and gait problem.  Skin: Negative for color change, pallor and rash.  Neurological: Negative for dizziness, light-headedness, numbness and headaches.  Hematological: Negative for adenopathy. Does not bruise/bleed easily.  Psychiatric/Behavioral: Negative for suicidal ideas, confusion, sleep disturbance, self-injury, dysphoric mood, decreased concentration and agitation.       Objective:    BP 112/78  Pulse 65  Temp(Src) 98.2 F (36.8 C) (Oral)  Ht 5\' 5"  (1.651 m)  Wt 226 lb (102.513 kg)  BMI 37.61 kg/m2  SpO2 98% General appearance: alert, cooperative, appears stated age and no distress Head: Normocephalic, without obvious abnormality, atraumatic Eyes: conjunctivae/corneas clear. PERRL, EOM's intact. Fundi  benign. Ears: normal TM's and external ear canals both ears Nose: Nares normal. Septum midline. Mucosa normal. No drainage or sinus tenderness. Throat: lips, mucosa, and tongue normal; teeth and gums normal Neck: no adenopathy, supple, symmetrical, trachea midline and thyroid not enlarged, symmetric, no tenderness/mass/nodules Back: symmetric, no curvature. ROM normal. No CVA tenderness. Lungs: clear to auscultation bilaterally Breasts: normal appearance, no masses or tenderness Heart: regular rate and rhythm, S1, S2 normal, no murmur, click, rub or gallop Abdomen: soft, non-tender; bowel sounds normal; no masses,  no organomegaly Pelvic: deferred--pt preference Extremities: extremities normal, atraumatic, no cyanosis or edema Pulses: 2+ and symmetric Skin: Skin color, texture, turgor normal. No rashes or lesions Lymph nodes: Cervical, supraclavicular, and axillary nodes normal. Neurologic: Alert and oriented X 3, normal strength and tone. Normal symmetric reflexes. Normal coordination and gait Psych-- normal      Assessment:    Healthy female exam.      Plan:     ghm utd  Check labs See After Visit Summary for Counseling Recommendations

## 2013-09-02 NOTE — Patient Instructions (Signed)

## 2013-09-02 NOTE — Progress Notes (Signed)
Pre visit review using our clinic review tool, if applicable. No additional management support is needed unless otherwise documented below in the visit note. 

## 2013-09-02 NOTE — Assessment & Plan Note (Signed)
Check labs 

## 2013-09-02 NOTE — Assessment & Plan Note (Signed)
con't meds stable 

## 2013-09-02 NOTE — Assessment & Plan Note (Signed)
Controlled con't meds  

## 2013-09-04 NOTE — Addendum Note (Signed)
Addended by: Ewing Schlein on: 09/04/2013 04:44 PM   Modules accepted: Orders

## 2013-09-10 ENCOUNTER — Other Ambulatory Visit: Payer: Self-pay | Admitting: Orthopedic Surgery

## 2013-09-10 ENCOUNTER — Other Ambulatory Visit: Payer: Self-pay | Admitting: Family Medicine

## 2013-09-10 DIAGNOSIS — R609 Edema, unspecified: Secondary | ICD-10-CM

## 2013-09-10 DIAGNOSIS — M25512 Pain in left shoulder: Secondary | ICD-10-CM

## 2013-09-10 DIAGNOSIS — R531 Weakness: Secondary | ICD-10-CM

## 2013-09-14 ENCOUNTER — Encounter: Payer: Self-pay | Admitting: Family Medicine

## 2013-09-14 ENCOUNTER — Telehealth: Payer: Self-pay | Admitting: *Deleted

## 2013-09-14 ENCOUNTER — Other Ambulatory Visit: Payer: Self-pay | Admitting: Family Medicine

## 2013-09-14 DIAGNOSIS — Z780 Asymptomatic menopausal state: Secondary | ICD-10-CM

## 2013-09-14 MED ORDER — CALCIUM CITRATE-VITAMIN D3 1000-400 PO LIQD: ORAL | Status: DC

## 2013-09-14 MED ORDER — CHOLECALCIFEROL 25 MCG (1000 UT) PO CAPS: 1000.0000 [IU] | ORAL_CAPSULE | Freq: Every day | ORAL | Status: DC

## 2013-09-14 MED ORDER — FEXOFENADINE HCL 180 MG PO TABS: 180.0000 mg | ORAL_TABLET | Freq: Every day | ORAL | Status: DC

## 2013-09-14 MED ORDER — B-12 1000 MCG SL SUBL: SUBLINGUAL_TABLET | SUBLINGUAL | Status: DC

## 2013-09-14 NOTE — Telephone Encounter (Signed)
She can have mri---- she should be seen either her for leg problem or at ortho and here for chest pain

## 2013-09-14 NOTE — Telephone Encounter (Signed)
Patient called and stated that she is seeing Dr. Marlou Sa for her shoulder injury. Patient states that they are wanting to do a  MRI with contrast. Patient would like to know if that is okay to do with low TSH? Also patient patient states that she has been experiencing some L leg weakness that started over the weekend. Patient states that she was also experiencing some chest pain but got relieved with Rolaids. Please advise. SW

## 2013-09-14 NOTE — Telephone Encounter (Signed)
Patient states that she we will go ahead and schedule MRI and patient scheduled and appointment to be seen

## 2013-09-16 ENCOUNTER — Ambulatory Visit (INDEPENDENT_AMBULATORY_CARE_PROVIDER_SITE_OTHER): Payer: BC Managed Care – PPO | Admitting: Family Medicine

## 2013-09-16 ENCOUNTER — Encounter: Payer: Self-pay | Admitting: Family Medicine

## 2013-09-16 VITALS — BP 122/86 | HR 83 | Temp 98.5°F | Wt 224.0 lb

## 2013-09-16 DIAGNOSIS — M6281 Muscle weakness (generalized): Secondary | ICD-10-CM

## 2013-09-16 DIAGNOSIS — R531 Weakness: Secondary | ICD-10-CM | POA: Insufficient documentation

## 2013-09-16 DIAGNOSIS — R079 Chest pain, unspecified: Secondary | ICD-10-CM

## 2013-09-16 DIAGNOSIS — K219 Gastro-esophageal reflux disease without esophagitis: Secondary | ICD-10-CM

## 2013-09-16 LAB — CBC WITH DIFFERENTIAL/PLATELET
BASOS PCT: 0.5 % (ref 0.0–3.0)
Basophils Absolute: 0 10*3/uL (ref 0.0–0.1)
Eosinophils Absolute: 0.1 10*3/uL (ref 0.0–0.7)
Eosinophils Relative: 1.3 % (ref 0.0–5.0)
HCT: 42.8 % (ref 36.0–46.0)
Hemoglobin: 14 g/dL (ref 12.0–15.0)
Lymphocytes Relative: 45.3 % (ref 12.0–46.0)
Lymphs Abs: 2.4 10*3/uL (ref 0.7–4.0)
MCHC: 32.6 g/dL (ref 30.0–36.0)
MCV: 88.3 fl (ref 78.0–100.0)
MONO ABS: 0.6 10*3/uL (ref 0.1–1.0)
Monocytes Relative: 10.4 % (ref 3.0–12.0)
NEUTROS PCT: 42.5 % — AB (ref 43.0–77.0)
Neutro Abs: 2.3 10*3/uL (ref 1.4–7.7)
PLATELETS: 291 10*3/uL (ref 150.0–400.0)
RBC: 4.85 Mil/uL (ref 3.87–5.11)
RDW: 14.9 % — AB (ref 11.5–14.6)
WBC: 5.4 10*3/uL (ref 4.5–10.5)

## 2013-09-16 LAB — BASIC METABOLIC PANEL
BUN: 13 mg/dL (ref 6–23)
CHLORIDE: 106 meq/L (ref 96–112)
CO2: 26 mEq/L (ref 19–32)
Calcium: 9.8 mg/dL (ref 8.4–10.5)
Creatinine, Ser: 1.2 mg/dL (ref 0.4–1.2)
GFR: 62.69 mL/min (ref >60.00)
Glucose, Bld: 79 mg/dL (ref 70–99)
Potassium: 4.2 mEq/L (ref 3.5–5.1)
Sodium: 139 mEq/L (ref 135–145)

## 2013-09-16 LAB — HEPATIC FUNCTION PANEL
ALT: 26 U/L (ref 0–35)
AST: 28 U/L (ref 0–37)
Albumin: 4.2 g/dL (ref 3.5–5.2)
Alkaline Phosphatase: 63 U/L (ref 39–117)
BILIRUBIN TOTAL: 0.6 mg/dL (ref 0.3–1.2)
Bilirubin, Direct: 0 mg/dL (ref 0.0–0.3)
TOTAL PROTEIN: 7.6 g/dL (ref 6.0–8.3)

## 2013-09-16 LAB — D-DIMER, QUANTITATIVE: D-Dimer, Quant: 0.28 ug/mL-FEU (ref 0.00–0.48)

## 2013-09-16 LAB — TROPONIN I: TROPONIN I: 0.01 ng/mL (ref <0.06)

## 2013-09-16 MED ORDER — MOMETASONE FUROATE 50 MCG/ACT NA SUSP: 2.0000 | Freq: Every day | NASAL | Status: DC

## 2013-09-16 NOTE — Patient Instructions (Addendum)
Chest Pain (Nonspecific) It is often hard to give a specific diagnosis for the cause of chest pain. There is always a chance that your pain could be related to something serious, such as a heart attack or a blood clot in the lungs. You need to follow up with your caregiver for further evaluation. CAUSES   Heartburn.  Pneumonia or bronchitis.  Anxiety or stress.  Inflammation around your heart (pericarditis) or lung (pleuritis or pleurisy).  A blood clot in the lung.  A collapsed lung (pneumothorax). It can develop suddenly on its own (spontaneous pneumothorax) or from injury (trauma) to the chest.  Shingles infection (herpes zoster virus). The chest wall is composed of bones, muscles, and cartilage. Any of these can be the source of the pain.  The bones can be bruised by injury.  The muscles or cartilage can be strained by coughing or overwork.  The cartilage can be affected by inflammation and become sore (costochondritis). DIAGNOSIS  Lab tests or other studies, such as X-rays, electrocardiography, stress testing, or cardiac imaging, may be needed to find the cause of your pain.  TREATMENT   Treatment depends on what may be causing your chest pain. Treatment may include:  Acid blockers for heartburn.  Anti-inflammatory medicine.  Pain medicine for inflammatory conditions.  Antibiotics if an infection is present.  You may be advised to change lifestyle habits. This includes stopping smoking and avoiding alcohol, caffeine, and chocolate.  You may be advised to keep your head raised (elevated) when sleeping. This reduces the chance of acid going backward from your stomach into your esophagus.  Most of the time, nonspecific chest pain will improve within 2 to 3 days with rest and mild pain medicine. HOME CARE INSTRUCTIONS   If antibiotics were prescribed, take your antibiotics as directed. Finish them even if you start to feel better.  For the next few days, avoid physical  activities that bring on chest pain. Continue physical activities as directed.  Do not smoke.  Avoid drinking alcohol.  Only take over-the-counter or prescription medicine for pain, discomfort, or fever as directed by your caregiver.  Follow your caregiver's suggestions for further testing if your chest pain does not go away.  Keep any follow-up appointments you made. If you do not go to an appointment, you could develop lasting (chronic) problems with pain. If there is any problem keeping an appointment, you must call to reschedule. SEEK MEDICAL CARE IF:   You think you are having problems from the medicine you are taking. Read your medicine instructions carefully.  Your chest pain does not go away, even after treatment.  You develop a rash with blisters on your chest. SEEK IMMEDIATE MEDICAL CARE IF:   You have increased chest pain or pain that spreads to your arm, neck, jaw, back, or abdomen.  You develop shortness of breath, an increasing cough, or you are coughing up blood.  You have severe back or abdominal pain, feel nauseous, or vomit.  You develop severe weakness, fainting, or chills.  You have a fever. THIS IS AN EMERGENCY. Do not wait to see if the pain will go away. Get medical help at once. Call your local emergency services (911 in [place] Do not drive yourself to the hospital. MAKE SURE YOU:   Understand these instructions.  Will watch your condition.  Will get help right away if you are not doing well or get worse. Document Released: 05/16/2005 Document Revised: 10/29/2011 Document Reviewed: 03/11/2008 [facility] Patient Information 2014 [place].  Transient Ischemic Attack A transient ischemic attack (TIA) is a "warning stroke" that causes stroke-like symptoms. Unlike a stroke, a TIA does not cause permanent damage to the brain. The symptoms of a TIA can happen very fast and do not last long. It is important to know the symptoms of a TIA and what to do.  This can help prevent a major stroke or death. CAUSES   A TIA is caused by a temporary blockage in an artery in the brain or neck (carotid artery). The blockage does not allow the brain to get the blood supply it needs and can cause different symptoms. The blockage can be caused by either:  A blood clot.  Fatty buildup (plaque) in a neck or brain artery. RISK FACTORS  High blood pressure (hypertension).  High cholesterol.  Diabetes mellitus.  Heart disease.  The build up of plaque in the blood vessels (peripheral artery disease or atherosclerosis).  The build up of plaque in the blood vessels providing blood and oxygen to the brain (carotid artery stenosis).  An abnormal heart rhythm (atrial fibrillation).  Obesity.  Smoking.  Taking oral contraceptives (especially in combination with smoking).  Physical inactivity.  A diet high in fats, salt (sodium), and calories.  Alcohol use.  Use of illegal drugs (especially cocaine and methamphetamine).  Being female.  Being African American.  Being over the age of 21.  Family history of stroke.  Previous history of blood clots, stroke, TIA, or heart attack.  Sickle cell disease. SYMPTOMS  TIA symptoms are the same as a stroke but are temporary. These symptoms usually develop suddenly, or may be newly present upon awakening from sleep:  Sudden weakness or numbness of the face, arm, or leg, especially on one side of the body.  Sudden trouble walking or difficulty moving arms or legs.  Sudden confusion.  Sudden personality changes.  Trouble speaking (aphasia) or understanding.  Difficulty swallowing.  Sudden trouble seeing in one or both eyes.  Double vision.  Dizziness.  Loss of balance or coordination.  Sudden severe headache with no known cause.  Trouble reading or writing.  Loss of bowel or bladder control.  Loss of consciousness. DIAGNOSIS  Your caregiver may be able to determine the presence or  absence of a TIA based on your symptoms, history, and physical exam. Computed tomography (CT scan) of the brain is usually performed to help identify a TIA. Other tests may be done to diagnose a TIA. These tests may include:  Electrocardiography.  Continuous heart monitoring.  Echocardiography.  Carotid ultrasonography.  Magnetic resonance imaging (MRI).  A scan of the brain circulation.  Blood tests. PREVENTION  The risk of a TIA can be decreased by appropriately treating high blood pressure, high cholesterol, diabetes, heart disease, and obesity and by quitting smoking, limiting alcohol, and staying physically active. TREATMENT  Time is of the essence. Since the symptoms of TIA are the same as a stroke, it is important to seek treatment within 3 4 hours of the start of symptoms because you may receive a medicine to dissolve the clot (thrombolytic) that cannot be given after that time. Treatment options vary. Treatment options may include rest, oxygen, intravenous (IV) fluids, and medicines to thin the blood (anticoagulants). Medicines and diet may be used to address diabetes, high blood pressure, and other risk factors. Measures will be taken to prevent short-term and long-term complications, including infection from breathing foreign material into the lungs (aspiration pneumonia), blood clots in the legs, and  falls. Treatment options include procedures to either remove plaque in the carotid arteries or dilate carotid arteries that have narrowed due to plaque. Those procedures are:  Carotid endarterectomy.  Carotid angioplasty and stenting. HOME CARE INSTRUCTIONS   Take all medicines prescribed by your caregiver. Follow the directions carefully. Medicines may be used to control risk factors for a stroke. Be sure you understand all your medicine instructions.  You may be told to take aspirin or the anticoagulant warfarin. Warfarin needs to be taken exactly as instructed.  Taking too  much or too little warfarin is dangerous. Too much warfarin increases the risk of bleeding. Too little warfarin continues to allow the risk for blood clots. While taking warfarin, you will need to have regular blood tests to measure your blood clotting time. A PT blood test measures how long it takes for blood to clot. Your PT is used to calculate another value called an INR. Your PT and INR help your caregiver to adjust your dose of warfarin. The dose can change for many reasons. It is critically important that you take warfarin exactly as prescribed.  Many foods, especially foods high in vitamin K can interfere with warfarin and affect the PT and INR. Foods high in vitamin K include spinach, kale, broccoli, cabbage, collard and turnip greens, brussels sprouts, peas, cauliflower, seaweed, and parsley as well as beef and pork liver, green tea, and soybean oil. You should eat a consistent amount of foods high in vitamin K. Avoid major changes in your diet, or notify your caregiver before changing your diet. Arrange a visit with a dietitian to answer your questions.  Many medicines can interfere with warfarin and affect the PT and INR. You must tell your caregiver about any and all medicines you take, this includes all vitamins and supplements. Be especially cautious with aspirin and anti-inflammatory medicines. Do not take or discontinue any prescribed or over-the-counter medicine except on the advice of your caregiver or pharmacist.  Warfarin can have side effects, such as excessive bruising or bleeding. You will need to hold pressure over cuts for longer than usual. Your caregiver or pharmacist will discuss other potential side effects.  Avoid sports or activities that may cause injury or bleeding.  Be mindful when shaving, flossing your teeth, or handling sharp objects.  Alcohol can change the body's ability to handle warfarin. It is best to avoid alcoholic drinks or consume only very small amounts  while taking warfarin. Notify your caregiver if you change your alcohol intake.  Notify your dentist or other caregivers before procedures.  Eat a diet that includes 5 or more servings of fruits and vegetables each day. This may reduce the risk of stroke. Certain diets may be prescribed to address high blood pressure, high cholesterol, diabetes, or obesity.  A low-sodium, low-saturated fat, low-trans fat, low-cholesterol diet is recommended to manage high blood pressure.  A low-saturated fat, low-trans fat, low-cholesterol, and high-fiber diet may control cholesterol levels.  A controlled-carbohydrate, controlled-sugar diet is recommended to manage diabetes.  A reduced-calorie, low-sodium, low-saturated fat, low-trans fat, low-cholesterol diet is recommended to manage obesity.  Maintain a healthy weight.  Stay physically active. It is recommended that you get at least 30 minutes of activity on most or all days.  Do not smoke.  Limit alcohol use even if you are not taking warfarin. Moderate alcohol use is considered to be:  No more than 2 drinks each day for men.  No more than 1 drink each day for nonpregnant  women.  Stop drug abuse.  Home safety. A safe home environment is important to reduce the risk of falls. Your caregiver may arrange for specialists to evaluate your home. Having grab bars in the bedroom and bathroom is often important. Your caregiver may arrange for equipment to be used at home, such as raised toilets and a seat for the shower.  Follow all instructions for follow-up with your caregiver. This is very important. This includes any referrals and lab tests. Proper follow up can prevent a stroke or another TIA from occurring. SEEK MEDICAL CARE IF:  You have personality changes.  You have difficulty swallowing.  You are seeing double.  You have dizziness.  You have a fever.  You have skin breakdown. SEEK IMMEDIATE MEDICAL CARE IF:  Any of these symptoms may  represent a serious problem that is an emergency. Do not wait to see if the symptoms will go away. Get medical help right away. Call your local emergency services (911 in [place] Do not drive yourself to the hospital.  You have sudden weakness or numbness of the face, arm, or leg, especially on one side of the body.  You have sudden trouble walking or difficulty moving arms or legs.  You have sudden confusion.  You have trouble speaking (aphasia) or understanding.  You have sudden trouble seeing in one or both eyes.  You have a loss of balance or coordination.  You have a sudden, severe headache with no known cause.  You have new chest pain or an irregular heartbeat.  You have a partial or total loss of consciousness. MAKE SURE YOU:   Understand these instructions.  Will watch your condition.  Will get help right away if you are not doing well or get worse. Document Released: 05/16/2005 Document Revised: 07/23/2012 Document Reviewed: 09/29/2009 [facility] Patient Information 2014 [place].

## 2013-09-16 NOTE — Assessment & Plan Note (Signed)
con't med May need GI f/u

## 2013-09-16 NOTE — Progress Notes (Signed)
  Subjective:    Emily Osborne is a 53 y.o. female who presents for evaluation of chest pain. Onset was 4 days ago. Symptoms have improved since that time. The patient describes the pain as burning, pressure and radiates to the left arm. Patient rates pain as a 8/10 in intensity. Associated symptoms are: chest pain and l sided weakness on Sunday that has resolved. Aggravating factors are: none. Alleviating factors are: antacids and rest. Patient's cardiac risk factors are: dyslipidemia and obesity (BMI >= 30 kg/m2). Patient's risk factors for DVT/PE: none. Previous cardiac testing: electrocardiogram (ECG).  The following portions of the patient's history were reviewed and updated as appropriate: allergies, current medications, past family history, past medical history, past social history, past surgical history and problem list.  Review of Systems Pertinent items are noted in HPI.    Objective:    BP 122/86  Pulse 83  Temp(Src) 98.5 F (36.9 C) (Oral)  Wt 224 lb (101.606 kg)  SpO2 97% General appearance: alert, cooperative, appears stated age and no distress Neck: no adenopathy, no carotid bruit, no JVD, supple, symmetrical, trachea midline and thyroid not enlarged, symmetric, no tenderness/mass/nodules Lungs: clear to auscultation bilaterally Heart: S1, S2 normal Extremities: extremities normal, atraumatic, no cyanosis or edema Neurologic: Alert and oriented X 3, normal strength and tone. Normal symmetric reflexes. Normal coordination and gait  Cardiographics ECG: normal sinus rhythm, no blocks or conduction defects, no ischemic changes  Imaging Chest x-ray: not indicated    Assessment:    Chest pain, suspected etiology: GERD    Plan:    Worsening signs and symptoms discussed and patient verbalized understanding. Cardiology consultation. check labs

## 2013-09-16 NOTE — Progress Notes (Signed)
Pre visit review using our clinic review tool, if applicable. No additional management support is needed unless otherwise documented below in the visit note. 

## 2013-09-16 NOTE — Assessment & Plan Note (Signed)
Now resolved ---- if occurs again with chest pain ----go to eR ? tIa--- check MrI brain

## 2013-09-17 ENCOUNTER — Other Ambulatory Visit: Payer: Self-pay | Admitting: Family Medicine

## 2013-09-17 ENCOUNTER — Ambulatory Visit
Admission: RE | Admit: 2013-09-17 | Discharge: 2013-09-17 | Disposition: A | Discharge status: Home / Self Care | Payer: BC Managed Care – PPO | Source: Ambulatory Visit | Attending: Family Medicine | Admitting: Family Medicine

## 2013-09-17 DIAGNOSIS — R9089 Other abnormal findings on diagnostic imaging of central nervous system: Secondary | ICD-10-CM

## 2013-09-17 DIAGNOSIS — R531 Weakness: Secondary | ICD-10-CM

## 2013-09-18 ENCOUNTER — Other Ambulatory Visit: Payer: Self-pay | Admitting: Family Medicine

## 2013-09-18 ENCOUNTER — Other Ambulatory Visit: Payer: Self-pay

## 2013-09-18 DIAGNOSIS — R531 Weakness: Secondary | ICD-10-CM

## 2013-09-18 DIAGNOSIS — R9389 Abnormal findings on diagnostic imaging of other specified body structures: Secondary | ICD-10-CM

## 2013-09-19 ENCOUNTER — Encounter: Payer: Self-pay | Admitting: Family Medicine

## 2013-09-19 ENCOUNTER — Other Ambulatory Visit: Payer: Self-pay | Admitting: Gastroenterology

## 2013-09-24 ENCOUNTER — Ambulatory Visit
Admission: RE | Admit: 2013-09-24 | Discharge: 2013-09-24 | Disposition: A | Discharge status: Home / Self Care | Payer: BC Managed Care – PPO | Source: Ambulatory Visit | Attending: Orthopedic Surgery | Admitting: Orthopedic Surgery

## 2013-09-24 DIAGNOSIS — R609 Edema, unspecified: Secondary | ICD-10-CM

## 2013-09-24 DIAGNOSIS — R531 Weakness: Secondary | ICD-10-CM

## 2013-09-24 DIAGNOSIS — M25512 Pain in left shoulder: Secondary | ICD-10-CM

## 2013-09-24 MED ORDER — IOHEXOL 180 MG/ML  SOLN
15.0000 mL | Freq: Once | INTRAMUSCULAR | Status: AC | PRN
  Administered 2013-09-24: 15 mL via INTRA_ARTICULAR

## 2013-09-28 ENCOUNTER — Ambulatory Visit (INDEPENDENT_AMBULATORY_CARE_PROVIDER_SITE_OTHER): Payer: BC Managed Care – PPO | Admitting: Cardiovascular Disease

## 2013-09-28 VITALS — BP 130/80 | HR 65 | Resp 11 | Wt 223.0 lb

## 2013-09-28 DIAGNOSIS — M25519 Pain in unspecified shoulder: Secondary | ICD-10-CM

## 2013-09-28 DIAGNOSIS — K219 Gastro-esophageal reflux disease without esophagitis: Secondary | ICD-10-CM

## 2013-09-28 DIAGNOSIS — M25512 Pain in left shoulder: Secondary | ICD-10-CM | POA: Insufficient documentation

## 2013-09-28 DIAGNOSIS — R079 Chest pain, unspecified: Secondary | ICD-10-CM

## 2013-09-28 DIAGNOSIS — K3189 Other diseases of stomach and duodenum: Secondary | ICD-10-CM

## 2013-09-28 DIAGNOSIS — R1013 Epigastric pain: Secondary | ICD-10-CM

## 2013-09-28 NOTE — Assessment & Plan Note (Signed)
F/U Emily Osborne  Some bruising from injection.  NSAI's  No labral tear.

## 2013-09-28 NOTE — Assessment & Plan Note (Signed)
Atypical Likely related to GERD and left shoulder arthropathy.  F/U ETT since ECG is normal

## 2013-09-28 NOTE — Assessment & Plan Note (Signed)
Conitnue diet Rx as limited risk factors and LDL less than 160

## 2013-09-28 NOTE — Patient Instructions (Signed)

## 2013-09-28 NOTE — Assessment & Plan Note (Signed)
Discussed low carb diet and weight loss  PRN antacids May be etiology of chest pains

## 2013-09-28 NOTE — Progress Notes (Signed)
Patient ID: Emily Osborne, female   DOB: 24-Oct-1960, 53 y.o.   MRN: [id number] Emily Osborne is a 53 y.o. female referred for evaluation of chest pain. Onset was 2 weeks ago  Symptoms have improved since that time. The patient describes the pain as burning, pressure and radiates to the left arm. Patient rates pain as a 8/10 in intensity. Associated symptoms are: chest pain and l sided weakness on Sunday that has resolved. Aggravating factors are: none. Alleviating factors are: antacids and rest. Patient's cardiac risk factors are: dyslipidemia and obesity (BMI >= 30 kg/m2). Seen by Dr Aundra Dubin in 2009 for atypical chest pain and had a normal ETT  Patient's risk factors for DVT/PE: none. Previous cardiac testing: electrocardiogram (ECG).  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  Was hospitalized a few years ago for chest pain R/O and CT with no disection   ROS: Denies fever, malais, weight loss, blurry vision, decreased visual acuity, cough, sputum, SOB, hemoptysis, pleuritic pain, palpitaitons, heartburn, abdominal pain, melena, lower extremity edema, claudication, or rash.  All other systems reviewed and negative   General: Affect appropriate Overweight black female  HEENT: normal Neck supple with no adenopathy JVP normal no bruits no thyromegaly Lungs clear with no wheezing and good diaphragmatic motion Heart:  S1/S2 no murmur,rub, gallop or click PMI normal Abdomen: benighn, BS positve, no tenderness, no AAA no bruit.  No HSM or HJR Distal pulses intact with no bruits No edema Neuro non-focal Skin warm and dry No muscular weakness  Medications Current Outpatient Prescriptions  Medication Sig Dispense Refill  . acetaminophen (TYLENOL) 500 MG tablet Take 500 mg by mouth every 6 (six) hours as needed.        Marland Kitchen albuterol (PROAIR HFA) 108 (90  BASE) MCG/ACT inhaler USE 2 PUFFS EVERY 4 HOURS AS NEEDED FOR COUGH, WHEEZE OR SHORTNESS OF BREATH.  8.5 each  2  . ALPRAZolam (XANAX) 0.25 MG tablet Take 1 tablet (0.25 mg total) by mouth 3 (three) times daily as needed.  30 tablet  1  . Calcium Citrate-Vitamin D3 1000-400 LIQD 1 po qd  480 mL  5  . Cholecalciferol ([employer]S VITAMIN D3) 1000 UNITS capsule Take 1 capsule (1,000 Units total) by mouth daily.  90 capsule  3  . Cyanocobalamin (B-12) 1000 MCG SUBL 1 sl qd  90 each  3  . cyclobenzaprine (FLEXERIL) 10 MG tablet Take 10 mg by mouth 3 (three) times daily as needed.        Marland Kitchen DEXILANT 60 MG capsule TAKE 1 CAPSULE BY MOUTH 30 MIN PRIOR TO BREAKFAST  30 capsule  11  . fexofenadine (ALLEGRA) 180 MG tablet Take 1 tablet (180 mg total) by mouth daily.  90 tablet  3  . Flaxseed, Linseed, (FLAX SEED OIL) 1000 MG CAPS Take 1 capsule by mouth daily.        . Fluticasone-Salmeterol (ADVAIR DISKUS) 250-50 MCG/DOSE AEPB Inhale 1 puff into the lungs 2 (two) times daily.        Marland Kitchen glucose blood (FREESTYLE LITE) test strip 1 each by Other route as needed. Use as instructed       . Hydrocodone-Acetaminophen (VICODIN) 5-300 MG TABS 1 po q6h prn pain  30 each  0  . lidocaine (LIDODERM) 5 % APPLY 1 PATCH ONTO SKIN EVERY 12 HOURS REMOVE  AND DISCARD PATCH WITHIN 12 HOURS OR AS DIRECTED BY MD  30 patch  0  . mometasone (NASONEX) 50 MCG/ACT nasal spray Place 2 sprays into the nose daily.  17 g  12  . NONFORMULARY OR COMPOUNDED ITEM Compression stockings 20-30 mmhg,  Edema #1  1 each  1  . valACYclovir (VALTREX) 1000 MG tablet TAKE 2 TABLETS BY MOUTH TWICE A DAY FOR 1 DAY AND AS DIRECTED  30 tablet  5   No current facility-administered medications for this visit.    Allergies Aspirin; Doxycycline; Ibuprofen; Influenza a (h1n1) monoval vac; Metronidazole; Nsaids; Ranitidine hcl; Sulfamethoxazole-trimethoprim; and Tramadol hcl  Family History: Family History  Problem Relation Age of Onset  . Coronary artery disease     . Hyperlipidemia    . Kidney disease    . Hypertension    . Diabetes Father   . Heart disease Father   . Kidney disease Father   . Leukemia Brother   . Cancer Brother     myleoblastic anemia  . Hypertension Mother   . Pernicious anemia Sister   . Lupus Daughter     Social History: History   Social History  . Marital Status: Divorced    Spouse Name: N/A    Number of Children: N/A  . Years of Education: N/A   Occupational History  . Not on file.   Social History Main Topics  . Smoking status: Never Smoker   . Smokeless tobacco: Never Used  . Alcohol Use: No  . Drug Use: No  . Sexual Activity: Not Currently    Partners: Male   Other Topics Concern  . Not on file   Social History Narrative   Exercise--- 3x a week , gym-----20-30 min    Electrocardiogram: 09/16/13  SR normal ECG   Assessment and Plan

## 2013-10-05 ENCOUNTER — Other Ambulatory Visit: Payer: Self-pay | Admitting: Orthopedic Surgery

## 2013-10-05 DIAGNOSIS — M542 Cervicalgia: Secondary | ICD-10-CM

## 2013-10-13 ENCOUNTER — Other Ambulatory Visit: Payer: BC Managed Care – PPO

## 2013-10-13 ENCOUNTER — Ambulatory Visit: Payer: BC Managed Care – PPO

## 2013-10-19 ENCOUNTER — Other Ambulatory Visit: Payer: Self-pay | Admitting: Family Medicine

## 2013-10-19 ENCOUNTER — Other Ambulatory Visit: Payer: BC Managed Care – PPO

## 2013-10-20 ENCOUNTER — Other Ambulatory Visit: Payer: BC Managed Care – PPO

## 2013-10-26 ENCOUNTER — Other Ambulatory Visit: Payer: BC Managed Care – PPO

## 2013-10-28 ENCOUNTER — Other Ambulatory Visit (INDEPENDENT_AMBULATORY_CARE_PROVIDER_SITE_OTHER): Payer: BC Managed Care – PPO

## 2013-10-28 DIAGNOSIS — E0590 Thyrotoxicosis, unspecified without thyrotoxic crisis or storm: Secondary | ICD-10-CM

## 2013-10-28 LAB — T3, FREE: T3 FREE: 2.9 pg/mL (ref 2.3–4.2)

## 2013-10-28 LAB — T4, FREE: Free T4: 0.9 ng/dL (ref 0.60–1.60)

## 2013-10-28 LAB — TSH: TSH: 0.29 u[IU]/mL — AB (ref 0.35–5.50)

## 2013-10-29 ENCOUNTER — Ambulatory Visit
Admission: RE | Admit: 2013-10-29 | Discharge: 2013-10-29 | Disposition: A | Discharge status: Home / Self Care | Payer: BC Managed Care – PPO | Source: Ambulatory Visit | Attending: Orthopedic Surgery | Admitting: Orthopedic Surgery

## 2013-10-29 ENCOUNTER — Other Ambulatory Visit: Payer: Self-pay | Admitting: Family Medicine

## 2013-10-29 DIAGNOSIS — M542 Cervicalgia: Secondary | ICD-10-CM

## 2013-10-29 DIAGNOSIS — E0590 Thyrotoxicosis, unspecified without thyrotoxic crisis or storm: Secondary | ICD-10-CM

## 2013-10-30 ENCOUNTER — Ambulatory Visit
Admission: RE | Admit: 2013-10-30 | Discharge: 2013-10-30 | Disposition: A | Discharge status: Home / Self Care | Payer: BC Managed Care – PPO | Source: Ambulatory Visit | Attending: Family Medicine | Admitting: Family Medicine

## 2013-10-30 DIAGNOSIS — E2839 Other primary ovarian failure: Secondary | ICD-10-CM

## 2013-10-30 DIAGNOSIS — Z1231 Encounter for screening mammogram for malignant neoplasm of breast: Secondary | ICD-10-CM

## 2013-11-02 ENCOUNTER — Encounter: Payer: Self-pay | Admitting: Neurology

## 2013-11-02 ENCOUNTER — Ambulatory Visit (INDEPENDENT_AMBULATORY_CARE_PROVIDER_SITE_OTHER): Payer: BC Managed Care – PPO | Admitting: Neurology

## 2013-11-02 VITALS — BP 132/76 | HR 72 | Temp 98.2°F | Resp 16 | Ht 65.0 in | Wt 223.4 lb

## 2013-11-02 DIAGNOSIS — R531 Weakness: Secondary | ICD-10-CM

## 2013-11-02 DIAGNOSIS — M6281 Muscle weakness (generalized): Secondary | ICD-10-CM

## 2013-11-02 DIAGNOSIS — R209 Unspecified disturbances of skin sensation: Secondary | ICD-10-CM

## 2013-11-02 LAB — VITAMIN B12: Vitamin B-12: 1075 pg/mL — ABNORMAL HIGH (ref 211–911)

## 2013-11-02 LAB — CK: Total CK: 159 U/L (ref 7–177)

## 2013-11-02 NOTE — Progress Notes (Signed)
[facility] Clinic Note - Initial Visit   Date: 11/02/2013    Starrla Metoxen MRN: [id number] DOB: [birth year]   Dear Dr Laury Axon:  Thank you for your kind referral of Aabha Klosky for consultation of left sided weakness. Although her history is well known to you, please allow [place] to reiterate it for the purpose of our medical record. The patient was accompanied to the clinic by daughter who also provides collateral information.     History of Present Illness: Quenesha Heller is a 53 y.o. right-handed African American ffemale with history of asthma, fibromyalgia, GERD,  presenting for evaluation of left sided weakness.  She is a Physiological scientist at [employer]  On January 25th, she was in [place] getting a newspaper and she developed sudden onset of weakness/numbness involving the left arm and leg. She was unable to walk, but did not fall so held onto her shopping cart.  She was able to walk to the car and when she arrived home, she felt weak and nauseous.  She felt that her entire body was weak all day long and developed chest pain in the evening.  By the following day, her weakness and numbness had resolved.    Since then, she feels as if she is getting weaker each day.  Symptoms are better in the morning and she feels tired by the afternoon.  In March, she developed a "wave of weakness starting in the left arm and then generalized over her entire body".  She also has spells of left eye twitching, left leg achiness, and left arm pain.  She has had problems with left shoulder pain and had a steroid injection as well as imaging of shoulder which shows a partial tear of the supraspinatus muscle.  Additional work-up has included MRI brain, MRI left shoulder, MRI cervical spine, and serology testing which is notable for chronic white matter changes of the brain, mild rotator cuff tendinopathy (partial-thickness tear of the supraspinatus), and low TSH.  She has an appointment to  see Dr. Lucianne Muss this week for evaluation of hyperthyroidism.   Past Medical History  Diagnosis Date  . Asthma   . Fibromyalgia   . IBS (irritable bowel syndrome)   . GERD (gastroesophageal reflux disease)   . Hyperlipidemia   . Hypertension   . Personal history of other diseases of digestive system   . Other acute reactions to stress   . Pain in joint, upper arm   . Pain in joint, shoulder region   . Elevated blood pressure reading without diagnosis of hypertension   . Edema   . Pain in limb   . Herpes zoster without mention of complication   . Nontraumatic rupture of Achilles tendon   . Ganglion of joint     right wrist    Past Surgical History  Procedure Laterality Date  . Abdominal hysterectomy    . Tubal ligation    . Foot surgery      Bunionectomy 1983  . Achilles tendon repair  2005  . Cystectomy      ganglion     Medications:  Current Outpatient Prescriptions on File Prior to Visit  Medication Sig Dispense Refill  . acetaminophen (TYLENOL) 500 MG tablet Take 500 mg by mouth every 6 (six) hours as needed.        Marland Kitchen albuterol (PROAIR HFA) 108 (90 BASE) MCG/ACT inhaler USE 2 PUFFS EVERY 4 HOURS AS NEEDED FOR COUGH, WHEEZE OR SHORTNESS OF BREATH.  8.5 each  2  . ALPRAZolam (XANAX) 0.25 MG tablet Take 1 tablet (0.25 mg total) by mouth 3 (three) times daily as needed.  30 tablet  1  . Calcium Citrate-Vitamin D3 1000-400 LIQD 1 po qd  480 mL  5  . Cyanocobalamin (B-12) 1000 MCG SUBL 1 sl qd  90 each  3  . cyclobenzaprine (FLEXERIL) 10 MG tablet Take 10 mg by mouth 3 (three) times daily as needed.        Marland Kitchen DEXILANT 60 MG capsule TAKE 1 CAPSULE BY MOUTH 30 MIN PRIOR TO BREAKFAST  30 capsule  11  . fexofenadine (ALLEGRA) 180 MG tablet Take 1 tablet (180 mg total) by mouth daily.  90 tablet  3  . Fluticasone-Salmeterol (ADVAIR DISKUS) 250-50 MCG/DOSE AEPB Inhale 1 puff into the lungs 2 (two) times daily.        Marland Kitchen glucose blood (FREESTYLE LITE) test strip 1 each by Other route  as needed. Use as instructed       . Hydrocodone-Acetaminophen (VICODIN) 5-300 MG TABS 1 po q6h prn pain  30 each  0  . lidocaine (LIDODERM) 5 % APPLY 1 PATCH ONTO SKIN EVERY 12 HOURS REMOVE AND DISCARD PATCH WITHIN 12 HOURS OR AS DIRECTED BY MD  30 patch  0  . mometasone (NASONEX) 50 MCG/ACT nasal spray Place 2 sprays into the nose daily.  17 g  12  . NONFORMULARY OR COMPOUNDED ITEM Compression stockings 20-30 mmhg,  Edema #1  1 each  1  . valACYclovir (VALTREX) 1000 MG tablet TAKE 2 TABLETS BY MOUTH TWICE A DAY FOR 1 DAY AND AS DIRECTED  30 tablet  5   No current facility-administered medications on file prior to visit.    Allergies:  Allergies  Allergen Reactions  . Aspirin     REACTION: anaphylaxis  . Doxycycline     REACTION: severe nausea/vomiting  . Ibuprofen     REACTION: anaphylaxsis  . Influenza A (H1n1) Monoval Vac     REACTION: sick for 3 weeks  . Metronidazole     REACTION: red face/swelling  . Nsaids     REACTION: anaphylaxis  . Ranitidine Hcl     REACTION: Lips re/peel  . Sulfamethoxazole-Trimethoprim     REACTION: face red/peel  . Tramadol Hcl     REACTION: paranoid    Family History: Family History  Problem Relation Age of Onset  . Coronary artery disease    . Hyperlipidemia    . Kidney disease    . Hypertension    . Diabetes Father   . Heart disease Father   . Kidney disease Father   . Leukemia Brother   . Cancer Brother     myleoblastic anemia  . Hypertension Mother   . Pernicious anemia Sister   . Lupus Daughter     Social History: History   Social History  . Marital Status: Divorced    Spouse Name: N/A    Number of Children: N/A  . Years of Education: N/A   Occupational History  . Not on file.   Social History Main Topics  . Smoking status: Never Smoker   . Smokeless tobacco: Never Used  . Alcohol Use: No  . Drug Use: No  . Sexual Activity: Not Currently    Partners: Male   Other Topics Concern  . Not on file   Social  History Narrative   Exercise--- 3x a week , gym-----20-30 min    Review of Systems:  CONSTITUTIONAL: No fevers, chills,  night sweats, or weight loss.   EYES: No visual changes or eye pain ENT: No hearing changes.  No history of nose bleeds.   RESPIRATORY: No cough, wheezing and shortness of breath.   CARDIOVASCULAR: Negative for chest pain, and palpitations.   GI: Negative for abdominal discomfort, blood in stools or black stools.  No recent change in bowel habits.   GU:  No history of incontinence.   MUSCLOSKELETAL: No history of joint pain or swelling.  No myalgias.   SKIN: Negative for lesions, rash, and itching.   HEMATOLOGY/ONCOLOGY: Negative for prolonged bleeding, bruising easily, and swollen nodes.    ENDOCRINE: Negative for cold or heat intolerance, polydipsia or goiter.   PSYCH:  +depression or anxiety symptoms.   NEURO: As Above.   Vital Signs:  BP 132/76  Pulse 72  Temp(Src) 98.2 F (36.8 C)  Resp 16  Ht 5\' 5"  (1.651 m)  Wt 223 lb 6.4 oz (101.334 kg)  BMI 37.18 kg/m2   General Medical Exam:   General:  Well appearing, comfortable.   Eyes/ENT: see cranial nerve examination.   Neck: No masses appreciated.  Full range of motion without tenderness.  No carotid bruits. Respiratory:  Clear to auscultation, good air entry bilaterally.   Cardiac:  Regular rate and rhythm, no murmur.    Back:  No pain to palpation of spinous processes.   Extremities:  No deformities, edema, or skin discoloration. Good capillary refill.   Skin:  Skin color, texture, turgor normal. No rashes or lesions.  Neurological Exam: MENTAL STATUS including orientation to time, place, person, recent and remote memory, attention span and concentration, language, and fund of knowledge is normal.  Speech is not dysarthric.  CRANIAL NERVES: II:  No visual field defects.  Unremarkable fundi.   III-IV-VI: Pupils equal round and reactive to light.  Normal conjugate, extra-ocular eye movements in all  directions of gaze.  No nystagmus.  No ptosis.   V:  Normal facial sensation.   VII:  Normal facial symmetry and movements.  No pathologic facial reflexes.  VIII:  Normal hearing and vestibular function.   IX-X:  Normal palatal movement.   XI:  Normal shoulder shrug and head rotation.   XII:  Normal tongue strength and range of motion, no deviation or fasciculation.  MOTOR:  No atrophy, fasciculations or abnormal movements.  No pronator drift.  Tone is normal.    Right Upper Extremity:    Left Upper Extremity:    Deltoid  5/5   Deltoid  5/5   Biceps  5/5   Biceps  5/5   Triceps  5/5   Triceps  5/5   Wrist extensors  5/5   Wrist extensors  5/5   Wrist flexors  5/5   Wrist flexors  5/5   Finger extensors  5/5   Finger extensors  5/5   Finger flexors  5/5   Finger flexors  5/5   Dorsal interossei  5/5   Dorsal interossei  5/5   Abductor pollicis  5/5   Abductor pollicis  5/5   Tone (Ashworth scale)  0  Tone (Ashworth scale)  0   Right Lower Extremity:    Left Lower Extremity:    Hip flexors  5/5   Hip flexors  5/5   Hip extensors  5/5   Hip extensors  5/5   Knee flexors  5/5   Knee flexors  5/5   Knee extensors  5/5   Knee extensors  5/5  Dorsiflexors  5/5   Dorsiflexors  5/5   Plantarflexors  5/5   Plantarflexors  5/5   Toe extensors  5/5   Toe extensors  5/5   Toe flexors  5/5   Toe flexors  5/5   Tone (Ashworth scale)  0  Tone (Ashworth scale)  0   MSRs:  Right                                                                 Left brachioradialis 3+  brachioradialis 3+  biceps 3+  biceps 3+  triceps 3+  triceps 3+  patellar 2+  patellar 2+  ankle jerk 2+  ankle jerk 2+  Hoffman no  Hoffman no  plantar response down  plantar response down   SENSORY:  Normal and symmetric perception of light touch, pinprick, vibration, and proprioception.  Romberg's sign absent.   COORDINATION/GAIT: Normal finger-to- nose-finger and heel-to-shin.  Intact rapid alternating movements  bilaterally.  Able to rise from a chair without using arms.  Gait narrow based and stable. Tandem and stressed gait intact.   Data: MRI lumbar spine 12/18/2011:  1. Moderate left and mild right foraminal stenosis at L5-S1 due to disc-osteophyte complex.  2. Mild degenerative disc disease and facet arthropathy at higher levels in the lumbar spine, but without overt impingement at those levels.  MRI brain 09/18/2013: 1. Periventricular and subcortical white matter disease is advanced for age. The finding is nonspecific but can be seen in the setting of chronic microvascular ischemia, a demyelinating process such as multiple sclerosis, vasculitis, complicated migraine headaches, or as the sequelae of a prior infectious or inflammatory process.  2. No acute intracranial abnormality.  3. Right greater than left maxillary sinus disease.  MRI cervical spine 10/29/2013: Minimal degenerative changes in the cervical spine, stable. No neural impingement.   MRI left shoulder 09/25/2013: 1. Degenerative glenohumeral arthropathy with chondral thinning and multiple degenerative subcortical cystic lesions along the glenoid.  2. Mild blunting of the anterior and posterior labrum, but without a well-defined tear.  3. Mild rotator cuff tendinopathy, with a partial-thickness intrasubstance insertional tear of the distal posterior supraspinatus. Component     Latest Ref Rng 07/14/2013 09/02/2013 10/28/2013  ANA     NEGATIVE NEG    Sed Rate     0 - 22 mm/hr 15    Rheumatoid Factor     <=14 IU/mL <10    TSH     0.35 - 5.50 uIU/mL  0.32 (L) 0.29 (L)  T3, Free     2.3 - 4.2 pg/mL   2.9  Free T4     0.60 - 1.60 ng/dL   4.01  Component     Latest Ref Rng 03/06/2011  Hemoglobin A1C     4.6 - 6.5 % 6.1     IMPRESSION: Ms. Dingmann is a 53 year-old female presenting for evaluation of episodic left sided weakness and paresthesias.  Her neurological examination is notable for brisk and symmetric reflexes in the upper  extremities with otherwise normal motor strength and tone.  There are no lateralizing findings on exam.  Brisk reflexes can be seen in hyperthyroidism.  I have reviewed her previous MRI brain and cervical spine imaging.  She has scattered white matter changes, but  I do not feel this is consistent with demyelinating disease, such as multiple sclerosis.  I will obtain vessel imaging of the brain and carotids to look for stenosis which could be contributing to her episodic weakness.  Because of her generalized weakness, I will also check for myasthenia gravis and screen for myopathy and vitamin B12 deficiency.  If, indeed, she has hyperthyroidism, her symptoms (eye twitching, weakness, numbness) and correction of thyroid abnormalities should help.     PLAN/RECOMMENDATIONS:  1.  Check CK, aldolase MG panel, vitamin B12 2.  CTA head and neck 3.  EMG of the left side  4.  Return to clinic in 6-weeks   The duration of this appointment visit was 50 minutes of face-to-face time with the patient.  Greater than 50% of this time was spent in counseling, explanation of diagnosis, planning of further management, and coordination of care.   Thank you for allowing me to participate in patient's care.  If I can answer any additional questions, I would be pleased to do so.    Sincerely,    Aalliyah Kilker K. Allena Katz, DO

## 2013-11-02 NOTE — Patient Instructions (Addendum)
1.  Your provider has requested that you have labwork completed today. Please go to [facility] on the first floor of this building before leaving the office today. 2.  We have scheduled you at [facility] for your CT angiogram of the head and neck. on 11/03/13 at 8:30 am. Please arrive 15 minutes prior and go to 1st floor radiology. If this is not a good date/time you can call [phone] to reschedule.  3.  EMG of the left side - 90 minutes. 4.  Return to clinic in 6 weeks.  ELECTROMYOGRAM AND NERVE CONDUCTION STUDIES (EMG/NCS) INSTRUCTIONS  How to Prepare The neurologist conducting the EMG will need to know if you have certain medical conditions. Tell the neurologist and other EMG lab personnel if you:   Have a pacemaker or any other electrical medical device   Take blood-thinning medications   Have hemophilia, a blood-clotting disorder that causes prolonged bleeding Bathing Take a shower or bath shortly before your exam in order to remove oils from your skin. Don't apply lotions or creams before the exam.  What to Expect You'll likely be asked to change into a hospital gown for the procedure and lie down on an examination table. The following explanations can help you understand what will happen during the exam.    Electrodes. The neurologist or a technician places surface electrodes at various locations on your skin depending on where you're experiencing symptoms. Or the neurologist may insert needle electrodes at different sites depending on your symptoms.    Sensations. The electrodes will at times transmit a tiny electrical current that you may feel as a twinge or spasm. The needle electrode may cause discomfort or pain that usually ends shortly after the needle is removed. If you are concerned about discomfort or pain, you may want to talk to the neurologist about taking a short break during the exam.    Instructions. During the needle EMG, the neurologist will assess whether  there is any spontaneous electrical activity when the muscle is at rest - activity that isn't present in healthy muscle tissue - and the degree of activity when you slightly contract the muscle.  He or she will give you instructions on resting and contracting a muscle at appropriate times. Depending on what muscles and nerves the neurologist is examining, he or she may ask you to change positions during the exam.  After your EMG You may experience some temporary, minor bruising where the needle electrode was inserted into your muscle. This bruising should fade within several days. If it persists, contact your primary care doctor.

## 2013-11-03 ENCOUNTER — Ambulatory Visit: Payer: BC Managed Care – PPO | Admitting: Endocrinology

## 2013-11-03 ENCOUNTER — Ambulatory Visit (HOSPITAL_COMMUNITY): Admission: RE | Admit: 2013-11-03 | Payer: BC Managed Care – PPO | Source: Ambulatory Visit

## 2013-11-03 ENCOUNTER — Ambulatory Visit (HOSPITAL_COMMUNITY)
Admission: RE | Admit: 2013-11-03 | Discharge: 2013-11-03 | Disposition: A | Discharge status: Home / Self Care | Payer: BC Managed Care – PPO | Source: Ambulatory Visit | Attending: Neurology | Admitting: Neurology

## 2013-11-03 DIAGNOSIS — R209 Unspecified disturbances of skin sensation: Secondary | ICD-10-CM | POA: Insufficient documentation

## 2013-11-03 DIAGNOSIS — R531 Weakness: Secondary | ICD-10-CM

## 2013-11-03 DIAGNOSIS — R29898 Other symptoms and signs involving the musculoskeletal system: Secondary | ICD-10-CM | POA: Insufficient documentation

## 2013-11-03 MED ORDER — IOHEXOL 350 MG/ML SOLN
80.0000 mL | Freq: Once | INTRAVENOUS | Status: AC | PRN
Start: 2013-11-03 — End: 2013-11-03
  Administered 2013-11-03: 130 mL via INTRAVENOUS

## 2013-11-04 ENCOUNTER — Ambulatory Visit (INDEPENDENT_AMBULATORY_CARE_PROVIDER_SITE_OTHER): Payer: BC Managed Care – PPO | Admitting: Endocrinology

## 2013-11-04 ENCOUNTER — Encounter: Payer: Self-pay | Admitting: Endocrinology

## 2013-11-04 VITALS — BP 122/82 | HR 92 | Temp 98.4°F | Resp 16 | Ht 65.25 in | Wt 225.6 lb

## 2013-11-04 DIAGNOSIS — R5381 Other malaise: Secondary | ICD-10-CM

## 2013-11-04 DIAGNOSIS — R7989 Other specified abnormal findings of blood chemistry: Secondary | ICD-10-CM

## 2013-11-04 DIAGNOSIS — R5383 Other fatigue: Principal | ICD-10-CM

## 2013-11-04 DIAGNOSIS — R946 Abnormal results of thyroid function studies: Secondary | ICD-10-CM

## 2013-11-04 LAB — ALDOLASE: Aldolase: 5 U/L (ref <=8.1)

## 2013-11-04 NOTE — Progress Notes (Signed)
Patient ID: Emily Osborne, female   DOB: Apr 27, 1961, 53 y.o.   MRN: [id number]                                                                                                              Reason for Appointment:  ? Hyperthyroidism, new consultation    History of Present Illness:  The patient is referred here for evaluation of abnormal TSH She has been complaining of feeling progressively more fatigued over the last 3 months or so. The fatigue is more a feeling of drowsiness during the day and she will take a nap if she can. She also tends to wake up feeling tired and is only able to sleep about 5 hours at night She does not complain of any weight loss, shakiness or nervousness. She may have occasional rapid heartbeat briefly but has had evaluation from cardiologist. No heat intolerance; she does tend to feel cold more often. Has only occasional episodes of sweating.   She has been evaluated periodically with thyroid function tests which showed the following:     Lab Results  Component Value Date   TSH 0.29* 10/28/2013   TSH 0.32* 09/02/2013   TSH 0.59 06/20/2012   FREET4 0.90 10/28/2013   FREET4 0.8 12/27/2009   She has not had any thyroid scan or ultrasound exams but has had CT scans of her neck which do not show any thyroid enlargement or nodule     Medication List       This list is accurate as of: 11/04/13 11:09 AM.  Always use your most recent med list.               ADVAIR DISKUS 250-50 MCG/DOSE Aepb  Generic drug:  Fluticasone-Salmeterol  Inhale 1 puff into the lungs 2 (two) times daily.     albuterol 108 (90 BASE) MCG/ACT inhaler  Commonly known as:  PROAIR HFA  USE 2 PUFFS EVERY 4 HOURS AS NEEDED FOR COUGH, WHEEZE OR SHORTNESS OF BREATH.     ALPRAZolam 0.25 MG tablet  Commonly known as:  XANAX  Take 1 tablet (0.25 mg total) by mouth 3 (three) times daily as needed.     B-12 1000 MCG Subl  1 sl qd     Calcium Citrate-Vitamin D3 1000-400 Liqd  1 po qd     cyclobenzaprine 10 MG tablet  Commonly known as:  FLEXERIL  Take 10 mg by mouth 3 (three) times daily as needed.     DEXILANT 60 MG capsule  Generic drug:  dexlansoprazole  TAKE 1 CAPSULE BY MOUTH 30 MIN PRIOR TO BREAKFAST     fexofenadine 180 MG tablet  Commonly known as:  ALLEGRA  Take 1 tablet (180 mg total) by mouth daily.     FREESTYLE LITE test strip  Generic drug:  glucose blood  1 each by Other route as needed. Use as instructed     Hydrocodone-Acetaminophen 5-300 MG Tabs  Commonly known as:  VICODIN  1 po q6h prn pain     lidocaine  5 %  Commonly known as:  LIDODERM  APPLY 1 PATCH ONTO SKIN EVERY 12 HOURS REMOVE AND DISCARD PATCH WITHIN 12 HOURS OR AS DIRECTED BY MD     mometasone 50 MCG/ACT nasal spray  Commonly known as:  NASONEX  Place 2 sprays into the nose daily.     NONFORMULARY OR COMPOUNDED ITEM  Compression stockings 20-30 mmhg,  Edema #1     TYLENOL 500 MG tablet  Generic drug:  acetaminophen  Take 500 mg by mouth every 6 (six) hours as needed.     valACYclovir 1000 MG tablet  Commonly known as:  VALTREX  TAKE 2 TABLETS BY MOUTH TWICE A DAY FOR 1 DAY AND AS DIRECTED            Past Medical History  Diagnosis Date  . Asthma   . Fibromyalgia   . IBS (irritable bowel syndrome)   . GERD (gastroesophageal reflux disease)   . Hyperlipidemia   . Hypertension   . Personal history of other diseases of digestive system   . Other acute reactions to stress   . Pain in joint, upper arm   . Pain in joint, shoulder region   . Elevated blood pressure reading without diagnosis of hypertension   . Edema   . Pain in limb   . Herpes zoster without mention of complication   . Nontraumatic rupture of Achilles tendon   . Ganglion of joint     right wrist    Past Surgical History  Procedure Laterality Date  . Abdominal hysterectomy    . Tubal ligation    . Foot surgery      Bunionectomy 1983  . Achilles tendon repair  2005  . Cystectomy      ganglion      Family History  Problem Relation Age of Onset  . Coronary artery disease    . Hyperlipidemia    . Kidney disease    . Hypertension    . Diabetes Father   . Heart disease Father   . Kidney disease Father     Died, 5  . Leukemia Brother   . Cancer Brother     myleoblastic anemia  . Hypertension Mother     Died, 31  . Pernicious anemia Sister   . Lupus Daughter     Social History:  reports that she has never smoked. She has never used smokeless tobacco. She reports that she does not drink alcohol or use illicit drugs.  Allergies:  Allergies  Allergen Reactions  . Gentamicin     Eye drops turned the sclera bright red  . Aspirin     REACTION: anaphylaxis  . Doxycycline     REACTION: severe nausea/vomiting  . Ibuprofen     REACTION: anaphylaxsis  . Influenza A (H1n1) Monoval Vac     REACTION: sick for 3 weeks  . Metronidazole     REACTION: red face/swelling  . Nsaids     REACTION: anaphylaxis  . Ranitidine Hcl     REACTION: Lips re/peel  . Sulfamethoxazole-Trimethoprim     REACTION: face red/peel  . Tramadol Hcl     REACTION: paranoid    Review of Systems:  She thinks she has generalized weakness but more so on the left side in the arm and leg. Currently being evaluated by neurologist for various conditions She has had difficulty with progressive weight gain despite trying to eat healthy She complains of pains in her left elbow and shoulder joints, previously had  pain in her right arm; also may have pain in her left lower leg No change in her skin but has some thinning of her hair Has no  history of high blood pressure.       No history of dyspnea on exertion.      No complaints of change in bowel habits.      Negative  history of Diabetes.     No recent swelling of feet       Examination:   BP 122/82  Pulse 92  Temp(Src) 98.4 F (36.9 C)  Resp 16  Ht 5' 5.25" (1.657 m)  Wt 225 lb 9.6 oz (102.331 kg)  BMI 37.27 kg/m2  SpO2 97%   General  Appearance:  well-built and nourished, pleasant, not anxious    Eyes: No excessive prominence, lid lag or stare. No swelling of the eyelids  Neck: The thyroid is nonpalpable There is no lymphadenopathy .          Heart: normal S1 and S2, no murmurs .         Lungs: breath sounds are clear bilaterally  Extremities: hands are warm. Joints of the hands appear normal No edema of fingers or ankle edema. Neurological: REFLEXES: at biceps are slightly brisk.  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

## 2013-11-05 ENCOUNTER — Telehealth: Payer: Self-pay | Admitting: Neurology

## 2013-11-05 NOTE — Telephone Encounter (Signed)
Please call pt w/ lab reports [phone] / Sherri S.

## 2013-11-05 NOTE — Telephone Encounter (Signed)
Spoke with pt and informed her that the results were not back yet but that I would call her when they come in.

## 2013-11-10 LAB — MYASTHENIA GRAVIS PANEL 2
Acetylcholine Rec Binding: <0.3 nmol/L
Acetylcholine Rec Mod Ab: 19 %
Aceytlcholine Rec Bloc Ab: <15 % of inhibition (ref <15)

## 2013-11-10 NOTE — Telephone Encounter (Signed)
Pt calling again for labs. Please check again to see if results are available. CB# [phone] / Venida Jarvis

## 2013-11-10 NOTE — Telephone Encounter (Signed)
Called and notified patient that MG panel is still pending.  CK, aldolase, and B12 is within normal limits.  Reassured her that there is no evidence of muscle disease and her CT/A brain and carotids also does not show abnormalities.  Romie Keeble K. Posey Pronto, DO

## 2013-11-13 ENCOUNTER — Ambulatory Visit (INDEPENDENT_AMBULATORY_CARE_PROVIDER_SITE_OTHER): Payer: BC Managed Care – PPO | Admitting: Physician Assistant

## 2013-11-13 ENCOUNTER — Telehealth: Payer: Self-pay | Admitting: Cardiovascular Disease

## 2013-11-13 DIAGNOSIS — R079 Chest pain, unspecified: Secondary | ICD-10-CM

## 2013-11-13 NOTE — Progress Notes (Signed)
Exercise Treadmill Test  Pre-Exercise Testing Evaluation Rhythm: normal sinus  Rate: 76 bpm     Test  Exercise Tolerance Test Ordering MD: Jenkins Rouge, MD  Interpreting MD: Richardson Dopp, PA-C  Unique Test No: 1  Treadmill:  1  Indication for ETT: chest pain - rule out ischemia  Contraindication to ETT: No   Stress Modality: exercise - treadmill  Cardiac Imaging Performed: non   Protocol: standard Bruce - maximal  Max BP:  189/107  Max MPHR (bpm):  167 85% MPR (bpm):  142  MPHR obtained (bpm):  157 % MPHR obtained:  94  Reached 85% MPHR (min:sec):  6:15 Total Exercise Time (min-sec):  7:00  Workload in METS:  8.5 Borg Scale: 15  Reason ETT Terminated:  desired heart rate attained    ST Segment Analysis At Rest: normal ST segments - no evidence of significant ST depression With Exercise: no evidence of significant ST depression  Other Information Arrhythmia:  No Angina during ETT:  present (1) Quality of ETT:  diagnostic  ETT Interpretation:  normal - no evidence of ischemia by ST analysis  Comments: Fair exercise capacity. Patient did note chest pain after exercise was complete. Normal BP response to exercise. No ST changes to suggest ischemia.   Recommendations: F/u with Dr. Jenkins Rouge as directed. Signed,  Richardson Dopp, PA-C   11/13/2013 9:55 AM

## 2013-11-13 NOTE — Progress Notes (Signed)
PT  AWARE OF  GXT  RESULTS ./CY 

## 2013-11-13 NOTE — Telephone Encounter (Signed)
PT  AWARE OF  GXT  RESULTS ./CY 

## 2013-11-13 NOTE — Telephone Encounter (Signed)
New  Message     Returning Emily Osborne's  Call.  Pt had a stress test this am

## 2013-11-24 ENCOUNTER — Encounter: Payer: Self-pay | Admitting: Family Medicine

## 2013-12-01 ENCOUNTER — Encounter: Payer: Self-pay | Admitting: Family Medicine

## 2013-12-03 ENCOUNTER — Encounter: Payer: Self-pay | Admitting: Neurology

## 2013-12-03 ENCOUNTER — Encounter: Payer: BC Managed Care – PPO | Admitting: Neurology

## 2013-12-03 ENCOUNTER — Ambulatory Visit (INDEPENDENT_AMBULATORY_CARE_PROVIDER_SITE_OTHER): Payer: BC Managed Care – PPO | Admitting: Neurology

## 2013-12-03 DIAGNOSIS — M6281 Muscle weakness (generalized): Secondary | ICD-10-CM

## 2013-12-03 DIAGNOSIS — R531 Weakness: Secondary | ICD-10-CM

## 2013-12-03 DIAGNOSIS — R209 Unspecified disturbances of skin sensation: Secondary | ICD-10-CM

## 2013-12-03 NOTE — Progress Notes (Signed)
See procedure note for EMG results.  Donika K. Patel, DO  

## 2013-12-03 NOTE — Procedures (Signed)
[facility]  [place], [place], [place] [address] Tel: [phone] Fax:  [phone] Test Date:  12/03/2013  Patient: Emily Osborne DOB: [birth year] Physician: Narda Amber, DO  Sex: Female Height: 5\' 5"  Ref Phys: Narda Amber  ID#: [id number] Temp: 33.6C Technician:    Patient Complaints: This is a 53 year-old female presenting for evaluation of left arm pain and paresthesias.  NCV & EMG Findings: Extensive evaluation of the left upper extremity reveals: 1. Normal median, ulnar, and radial sensory responses.  Palmer studies are normal. 2. Normal median and ulnar motor responses. 3. No evidence of active or chronic motor axonal loss changes affecting the test muscles.  Impression: This is a normal study of the left upper extremity.    In particular, there is no evidence of cervical radiculopathy or carpal tunnel syndrome.   ___________________________ Narda Amber, DO    Nerve Conduction Studies Anti Sensory Summary Table   Site NR Peak (ms) Norm Peak (ms) P-T Amp (V) Norm P-T Amp  Left Median Anti Sensory (2nd Digit)  Wrist    2.8 <3.6 34.6 >15  Left Radial Anti Sensory (Base 1st Digit)  Wrist    2.4 <2.7 16.8 >14  Left Ulnar Anti Sensory (5th Digit)  Wrist    2.8 <3.1 36.0 >10   Motor Summary Table   Site NR Onset (ms) Norm Onset (ms) O-P Amp (mV) Norm O-P Amp Site1 Site2 Delta-0 (ms) Dist (cm) Vel (m/s) Norm Vel (m/s)  Left Median Motor (Abd Poll Brev)  Wrist    3.1 <4.0 11.5 >6 Elbow Wrist 5.0 29.0 58 >50  Elbow    8.1  11.5         Left Ulnar Motor (Abd Dig Minimi)  Wrist    2.3 <3.1 13.0 >7 B Elbow Wrist 4.3 26.0 60 >50  B Elbow    6.6  12.3  A Elbow B Elbow 1.6 10.0 63 >50  A Elbow    8.2  11.8          Comparison Summary Table   Site NR Peak (ms) Norm Peak (ms) P-T Amp (V) Site1 Site2 Delta-P (ms) Norm Delta (ms)  Left Median/Ulnar Palm Comparison (Wrist - 8cm)  Median Palm    1.7 <2.2 64.9 Median Palm Ulnar Palm 0.1     Ulnar Palm    1.8 <2.2 9.2       EMG   Side Muscle Ins Act Fibs Psw Fasc Number Recrt Dur Dur. Amp Amp. Poly Poly. Comment  Left 1stDorInt Nml Nml Nml Nml Nml Nml Nml Nml Nml Nml Nml Nml N/A  Left FlexPolLong Nml Nml Nml Nml Nml Nml Nml Nml Nml Nml Nml Nml N/A  Left Ext Indicis Nml Nml Nml Nml Nml Nml Nml Nml Nml Nml Nml Nml N/A  Left PronatorTeres Nml Nml Nml Nml Nml Nml Nml Nml Nml Nml Nml Nml N/A  Left Biceps Nml Nml Nml Nml Nml Nml Nml Nml Nml Nml Nml Nml N/A  Left Triceps Nml Nml Nml Nml Nml Nml Nml Nml Nml Nml Nml Nml N/A  Left Deltoid Nml Nml Nml Nml Nml Nml Nml Nml Nml Nml Nml Nml N/A      Waveforms:

## 2013-12-15 ENCOUNTER — Encounter: Payer: Self-pay | Admitting: Neurology

## 2013-12-15 ENCOUNTER — Ambulatory Visit (INDEPENDENT_AMBULATORY_CARE_PROVIDER_SITE_OTHER): Payer: BC Managed Care – PPO | Admitting: Neurology

## 2013-12-15 VITALS — BP 120/78 | HR 73 | Wt 227.5 lb

## 2013-12-15 DIAGNOSIS — R292 Abnormal reflex: Secondary | ICD-10-CM

## 2013-12-15 DIAGNOSIS — M6281 Muscle weakness (generalized): Secondary | ICD-10-CM

## 2013-12-15 DIAGNOSIS — R209 Unspecified disturbances of skin sensation: Secondary | ICD-10-CM

## 2013-12-15 DIAGNOSIS — R531 Weakness: Secondary | ICD-10-CM

## 2013-12-15 NOTE — Progress Notes (Signed)
Follow-up Visit   Date: 12/15/2013    Emily Osborne MRN: [id number] DOB: 1960-09-24   Interim History: Emily Osborne is a 53 y.o. right-handed African American female with history of asthma, fibromyalgia, GERD, returning to the clinic regarding left sided weakness. She is a Programme researcher, broadcasting/film/video at [employer].   History of present illness: On January 25th, she was in [place] getting a newspaper and she developed sudden onset of weakness/numbness involving the left arm and leg. She was unable to walk, but did not fall so held onto her shopping cart. She was able to walk to the car and when she arrived home, she felt weak and nauseous. She felt that her entire body was weak all day long and developed chest pain in the evening. By the following day, her weakness and numbness had resolved.   Since then, she feels as if she is getting weaker each day. Symptoms are better in the morning and she feels tired by the afternoon. In March, she developed a "wave of weakness starting in the left arm and then generalized over her entire body". She also has spells of left eye twitching, left leg achiness, and left arm pain.   She has had problems with left shoulder pain and had a steroid injection as well as imaging of shoulder which shows a partial tear of the supraspinatus muscle. Additional work-up has included MRI brain, MRI left shoulder, MRI cervical spine, and serology testing which is notable for chronic white matter changes of the brain, mild rotator cuff tendinopathy (partial-thickness tear of the supraspinatus), and low TSH.  - Follow-up 12/15/2013:  She presents today to discuss results of CT/A head and neck, EMG, and labs (MG panel, CK, aldolase, B12) which was all normal.  She continues to have left arm discomfort, described as if her arm is being pulled.  She has found that lidoderm patch, tylenol, and eucalyptus cream helps.  It is improved when she extends the arm. There is no history of vision  loss, bowel/bladder incontinence, muscle stiffness, or falls.  She endorses memory changes, generalized fatigue, and worsening of symptoms in warmer temperatures.    Medications:  Current Outpatient Prescriptions on File Prior to Visit  Medication Sig Dispense Refill  . acetaminophen (TYLENOL) 500 MG tablet Take 500 mg by mouth every 6 (six) hours as needed.        Marland Kitchen albuterol (PROAIR HFA) 108 (90 BASE) MCG/ACT inhaler USE 2 PUFFS EVERY 4 HOURS AS NEEDED FOR COUGH, WHEEZE OR SHORTNESS OF BREATH.  8.5 each  2  . ALPRAZolam (XANAX) 0.25 MG tablet Take 1 tablet (0.25 mg total) by mouth 3 (three) times daily as needed.  30 tablet  1  . Calcium Citrate-Vitamin D3 1000-400 LIQD 1 po qd  480 mL  5  . Cyanocobalamin (B-12) 1000 MCG SUBL 1 sl qd  90 each  3  . cyclobenzaprine (FLEXERIL) 10 MG tablet Take 10 mg by mouth 3 (three) times daily as needed.        Marland Kitchen DEXILANT 60 MG capsule TAKE 1 CAPSULE BY MOUTH 30 MIN PRIOR TO BREAKFAST  30 capsule  11  . fexofenadine (ALLEGRA) 180 MG tablet Take 1 tablet (180 mg total) by mouth daily.  90 tablet  3  . Fluticasone-Salmeterol (ADVAIR DISKUS) 250-50 MCG/DOSE AEPB Inhale 1 puff into the lungs 2 (two) times daily.        Marland Kitchen glucose blood (FREESTYLE LITE) test strip 1 each by Other route as needed.  Use as instructed       . Hydrocodone-Acetaminophen (VICODIN) 5-300 MG TABS 1 po q6h prn pain  30 each  0  . lidocaine (LIDODERM) 5 % APPLY 1 PATCH ONTO SKIN EVERY 12 HOURS REMOVE AND DISCARD PATCH WITHIN 12 HOURS OR AS DIRECTED BY MD  30 patch  0  . mometasone (NASONEX) 50 MCG/ACT nasal spray Place 2 sprays into the nose daily.  17 g  12  . NONFORMULARY OR COMPOUNDED ITEM Compression stockings 20-30 mmhg,  Edema #1  1 each  1  . valACYclovir (VALTREX) 1000 MG tablet TAKE 2 TABLETS BY MOUTH TWICE A DAY FOR 1 DAY AND AS DIRECTED  30 tablet  5   No current facility-administered medications on file prior to visit.    Allergies:  Allergies  Allergen Reactions  .  Gentamicin     Eye drops turned the sclera bright red  . Aspirin     REACTION: anaphylaxis  . Doxycycline     REACTION: severe nausea/vomiting  . Ibuprofen     REACTION: anaphylaxsis  . Influenza A (H1n1) Monoval Vac     REACTION: sick for 3 weeks  . Metronidazole     REACTION: red face/swelling  . Nsaids     REACTION: anaphylaxis  . Ranitidine Hcl     REACTION: Lips re/peel  . Sulfamethoxazole-Trimethoprim     REACTION: face red/peel  . Tramadol Hcl     REACTION: paranoid     Review of Systems:  CONSTITUTIONAL: No fevers, chills, night sweats, or weight loss.   EYES: No visual changes or eye pain ENT: No hearing changes.  No history of nose bleeds.   RESPIRATORY: No cough, wheezing and shortness of breath.   CARDIOVASCULAR: Negative for chest pain, and palpitations.   GI: Negative for abdominal discomfort, blood in stools or black stools.  No recent change in bowel habits.   GU:  No history of incontinence.   MUSCLOSKELETAL: + history of joint pain or swelling.  + myalgias.   SKIN: Negative for lesions, rash, and itching.   ENDOCRINE: Negative for cold or heat intolerance, polydipsia or goiter.   PSYCH:  + depression or anxiety symptoms.   NEURO: As Above.   Vital Signs:  BP 120/78  Pulse 73  Wt 227 lb 8 oz (103.193 kg)  SpO2 97%  Neurological Exam: MENTAL STATUS including orientation to time, place, person, recent and remote memory, attention span and concentration, language, and fund of knowledge is normal.  Speech is not dysarthric.  CRANIAL NERVES: No visual field defects. Pupils equal round and reactive to light.  There is no INO.  Normal conjugate, extra-ocular eye movements in all directions of gaze.  No ptosis. Normal facial sensation.  Face is symmetric. Palate elevates symmetrically.  Tongue is midline. Jaw jerk is brisk. There is no Myerson sign or snout reflex.  MOTOR:  Motor strength is 5/5 in all extremities.  There is increased tone of the lower  extremities bilaterally (0+).  Tone in the upper extremities is normal.  No atrophy, fasciculations or abnormal movements.  No pronator drift.    Negative Lhermitte sign.  MSRs:  Right      Left  brachioradialis  3+   brachioradialis  3+   biceps  3+   biceps  3+   triceps  3+   triceps  3+   patellar  2+   patellar  2+   ankle jerk  2+   ankle jerk  2+  Hoffman  yes  Hoffman  yes  plantar response  down   plantar response  up   Bilateral medial pectoral reflexes and crossed abductors are present.  SENSORY:  Pinprick and temperature is reduced over the left forearm and otherwise intact.   COORDINATION/GAIT:  Normal finger-to- nose-finger and heel-to-shin.  Intact rapid alternating movements bilaterally.  Gait narrow based and stable. Stressed gait is intact (improved)  Data: Labs 10/28/2013:  TSH 0.29*, fT3 2.9, fT4 0.9 Labs 11/02/2013:  AChR binding/blocking/modulating - negative, aldolase 5.0, CK 159, B12 1075  EMG left upper extremity 12/03/2013: 1. Normal median, ulnar, and radial sensory responses. Palmer studies are normal. 2. Normal median and ulnar motor responses. 3. No evidence of active or chronic motor axonal loss changes affecting the test muscles. Impression:  This is a normal study of the left upper extremity.  In particular, there is no evidence of cervical radiculopathy or carpal tunnel syndrome.  CT/A head and neck 11/03/2013:   Normal CT of the head.  Normal CTA of the head and neck. No evidence of atherosclerotic  disease or dissection.  MRI cervical spine wo contrast 10/29/2013: Minimal degenerative changes in the cervical spine, stable. No neural impingement.  MRI shoulder 09/25/2013: 1. Degenerative glenohumeral arthropathy with chondral thinning and multiple degenerative subcortical cystic lesions along the glenoid.  2. Mild blunting of the anterior and posterior labrum, but without a well-defined tear.  3. Mild rotator cuff tendinopathy, with a  partial-thickness intrasubstance insertional tear of the distal posterior supraspinatus.  MRI brain 09/17/2013: 1. Periventricular and subcortical white matter disease is advanced for age. The finding is nonspecific but can be seen in the setting of chronic microvascular ischemia, a demyelinating process such as multiple sclerosis, vasculitis, complicated migraine headaches, or as the sequelae of a prior infectious or inflammatory process.  2. No acute intracranial abnormality.  3. Right greater than left maxillary sinus disease.   IMPRESSION: Ms. Partridge is a 53 year-old female returning for evaluation of episodic left sided weakness and paresthesias.  Motor strength remains intact, however reflexes are quite brisk even for her age and there is extensor plantar response on the right.  Tone of the legs is increased.  Given that her work-up has been negative and there is mild scattered white matter changes on her non-contrasted MRI, I would like to repeat MRI brain wwo contrast to look for any enhancement of these lesions, which would favor demyelinating disease. Since these are stereotype spells and she describes associated memory changes, I will also check EEG for seizures although my suspicion is low.  There is no evidence of cervical radiculopathy based on EMG or MRI of the cervical spine.  Vessel imaging of the brain and neck shows patent vasculature.  Labs show normal CK, B12, MG panel.     PLAN/RECOMMENDATIONS:  1.  MRI brain wwo contrast 2.  Routine EEG 3.  Check vitamin D25, copper, zinc 4.  Return to clinic in 6 weeks     The duration of this appointment visit was 30 minutes of face-to-face time with the patient.  Greater than 50% of this time was spent in counseling, explanation of diagnosis, planning of further management, and coordination of care.   Thank you for allowing me to participate in patient's care.  If I can answer any additional questions, I would be pleased to do so.     Sincerely,    Donika K. Posey Pronto, DO

## 2013-12-15 NOTE — Patient Instructions (Addendum)
1.  MRI brain wwo contrast 2.  Routine EEG 3.  Check vitamin D25, copper, zinc 4.  Return to clinic in 4-6 weeks

## 2013-12-15 NOTE — Progress Notes (Signed)
Notes faxed.

## 2013-12-16 ENCOUNTER — Telehealth: Payer: Self-pay | Admitting: Gastroenterology

## 2013-12-16 ENCOUNTER — Other Ambulatory Visit: Payer: Self-pay | Admitting: *Deleted

## 2013-12-16 DIAGNOSIS — R531 Weakness: Secondary | ICD-10-CM

## 2013-12-16 DIAGNOSIS — M79602 Pain in left arm: Secondary | ICD-10-CM

## 2013-12-16 LAB — VITAMIN D 25 HYDROXY (VIT D DEFICIENCY, FRACTURES): VIT D 25 HYDROXY: 34 ng/mL (ref 30–89)

## 2013-12-16 NOTE — Telephone Encounter (Signed)
Patient approved for medication dexilant until 12/16/2014

## 2013-12-17 ENCOUNTER — Telehealth: Payer: Self-pay | Admitting: Family Medicine

## 2013-12-17 LAB — ZINC: Zinc: 71 ug/dL (ref 60–130)

## 2013-12-17 LAB — COPPER, SERUM: COPPER: 96 ug/dL (ref 70–175)

## 2013-12-17 NOTE — Telephone Encounter (Signed)
Patient aware coupon left at check in for pick up.      KP

## 2013-12-17 NOTE — Telephone Encounter (Signed)
Caller name:Fedora Fendrick Relation to GF:[id number] Call back number:[phone] Pharmacy:  Reason for call: to request coupons for dexilant. Please advise

## 2013-12-18 ENCOUNTER — Telehealth: Payer: Self-pay | Admitting: Gastroenterology

## 2013-12-18 NOTE — Telephone Encounter (Signed)
Gave patient samples today

## 2013-12-18 NOTE — Telephone Encounter (Signed)
Faxed over approval letter to [facility]  Sent letter to be scanned in today

## 2013-12-21 ENCOUNTER — Ambulatory Visit (INDEPENDENT_AMBULATORY_CARE_PROVIDER_SITE_OTHER): Payer: BC Managed Care – PPO | Admitting: Neurology

## 2013-12-21 DIAGNOSIS — R5381 Other malaise: Secondary | ICD-10-CM

## 2013-12-21 DIAGNOSIS — R5383 Other fatigue: Secondary | ICD-10-CM

## 2013-12-21 DIAGNOSIS — M79602 Pain in left arm: Secondary | ICD-10-CM

## 2013-12-21 DIAGNOSIS — R531 Weakness: Secondary | ICD-10-CM

## 2013-12-21 DIAGNOSIS — M79609 Pain in unspecified limb: Secondary | ICD-10-CM

## 2013-12-22 NOTE — Procedures (Signed)
ELECTROENCEPHALOGRAM REPORT  Date of Study: 12/21/2013  Patient's Name: Emily Osborne MRN: [id number] Date of Birth: May 05, 1961  Referring Provider: Dr. Narda Amber  Clinical History: 53 year-old female returning for evaluation of episodic left sided weakness and paresthesias.   Medications: Xanax, B12, Flexeril, Valtrex  Technical Summary: A multichannel digital EEG recording measured by the international 10-20 system with electrodes applied with paste and impedances below 5000 ohms performed in our laboratory with EKG monitoring in an awake and asleep patient.  Hyperventilation was not performed. Photic stimulation was performed.  The digital EEG was referentially recorded, reformatted, and digitally filtered in a variety of bipolar and referential montages for optimal display.  Spike detection software was employed.  Description: The patient is awake and asleep during the recording.  During maximal wakefulness, there is a symmetric, medium voltage 9-9.5 Hz posterior dominant rhythm that attenuates with eye opening.  There is rare focal 5 Hz theta slowing seen over the left temporal region.  During drowsiness and sleep, there is an increase in theta slowing of the background.  Vertex waves and symmetric sleep spindles were seen.  Photic stimulation did not elicit any abnormalities.  There were no epileptiform discharges or electrographic seizures seen.    EKG lead was unremarkable.  Impression: This awake and asleep EEG is mildly abnormal due to rare focal slowing over the left temporal region.  Clinical Correlation of the above findings indicates focal cerebral dysfunction over the left temporal region suggestive of underlying structural or physiologic abnormality.  There were no epileptiform discharges or electrographic seizures seen. The absence of epileptiform discharges does not rule out a clinical diagnosis of epilepsy, clinical correlation is advised.   [name] Newer, M.D.

## 2013-12-24 ENCOUNTER — Ambulatory Visit (HOSPITAL_COMMUNITY)
Admission: RE | Admit: 2013-12-24 | Discharge: 2013-12-24 | Disposition: A | Discharge status: Home / Self Care | Payer: BC Managed Care – PPO | Source: Ambulatory Visit | Attending: Neurology | Admitting: Neurology

## 2013-12-24 DIAGNOSIS — R531 Weakness: Secondary | ICD-10-CM

## 2013-12-24 DIAGNOSIS — R29898 Other symptoms and signs involving the musculoskeletal system: Secondary | ICD-10-CM | POA: Insufficient documentation

## 2013-12-24 DIAGNOSIS — R209 Unspecified disturbances of skin sensation: Secondary | ICD-10-CM

## 2013-12-24 DIAGNOSIS — R292 Abnormal reflex: Secondary | ICD-10-CM

## 2013-12-24 LAB — CREATININE, SERUM
CREATININE: 0.96 mg/dL (ref 0.50–1.10)
GFR calc Af Amer: 77 mL/min — ABNORMAL LOW (ref >90)
GFR calc non Af Amer: 66 mL/min — ABNORMAL LOW (ref >90)

## 2013-12-24 MED ORDER — GADOBENATE DIMEGLUMINE 529 MG/ML IV SOLN
20.0000 mL | Freq: Once | INTRAVENOUS | Status: AC | PRN
  Administered 2013-12-24: 20 mL via INTRAVENOUS

## 2013-12-25 ENCOUNTER — Telehealth: Payer: Self-pay | Admitting: Neurology

## 2013-12-25 NOTE — Telephone Encounter (Signed)
Returning your call. °

## 2013-12-25 NOTE — Progress Notes (Signed)
Patient came in for EEG. See Procedure notes for EEG results.  

## 2013-12-25 NOTE — Telephone Encounter (Signed)
Discussed results of EEG and MRI brain wwo contrast with patient.  No abnormal enhancement or seizures.  Will continue to follow her clinically.  Emily K. Posey Pronto, DO

## 2014-01-05 ENCOUNTER — Ambulatory Visit (HOSPITAL_COMMUNITY): Payer: BC Managed Care – PPO

## 2014-01-22 ENCOUNTER — Ambulatory Visit: Payer: BC Managed Care – PPO | Admitting: Neurology

## 2014-04-10 ENCOUNTER — Emergency Department (HOSPITAL_COMMUNITY)
Admission: EM | Admit: 2014-04-10 | Discharge: 2014-04-10 | Disposition: A | Discharge status: Home / Self Care | Payer: BC Managed Care – PPO | Attending: Emergency Medicine | Admitting: Emergency Medicine

## 2014-04-10 ENCOUNTER — Encounter (HOSPITAL_COMMUNITY): Payer: Self-pay | Admitting: Emergency Medicine

## 2014-04-10 DIAGNOSIS — IMO0001 Reserved for inherently not codable concepts without codable children: Secondary | ICD-10-CM | POA: Diagnosis not present

## 2014-04-10 DIAGNOSIS — J45909 Unspecified asthma, uncomplicated: Secondary | ICD-10-CM | POA: Insufficient documentation

## 2014-04-10 DIAGNOSIS — M25569 Pain in unspecified knee: Secondary | ICD-10-CM | POA: Insufficient documentation

## 2014-04-10 DIAGNOSIS — M25561 Pain in right knee: Secondary | ICD-10-CM

## 2014-04-10 DIAGNOSIS — Z8639 Personal history of other endocrine, nutritional and metabolic disease: Secondary | ICD-10-CM | POA: Insufficient documentation

## 2014-04-10 DIAGNOSIS — Z79899 Other long term (current) drug therapy: Secondary | ICD-10-CM | POA: Insufficient documentation

## 2014-04-10 DIAGNOSIS — Z862 Personal history of diseases of the blood and blood-forming organs and certain disorders involving the immune mechanism: Secondary | ICD-10-CM | POA: Insufficient documentation

## 2014-04-10 DIAGNOSIS — IMO0002 Reserved for concepts with insufficient information to code with codable children: Secondary | ICD-10-CM | POA: Insufficient documentation

## 2014-04-10 DIAGNOSIS — Z8719 Personal history of other diseases of the digestive system: Secondary | ICD-10-CM | POA: Insufficient documentation

## 2014-04-10 DIAGNOSIS — M25461 Effusion, right knee: Secondary | ICD-10-CM

## 2014-04-10 DIAGNOSIS — Z8619 Personal history of other infectious and parasitic diseases: Secondary | ICD-10-CM | POA: Diagnosis not present

## 2014-04-10 DIAGNOSIS — M25469 Effusion, unspecified knee: Secondary | ICD-10-CM | POA: Insufficient documentation

## 2014-04-10 DIAGNOSIS — I10 Essential (primary) hypertension: Secondary | ICD-10-CM | POA: Diagnosis not present

## 2014-04-10 MED ORDER — HYDROCODONE-ACETAMINOPHEN 5-325 MG PO TABS
2.0000 | ORAL_TABLET | Freq: Once | ORAL | Status: AC
  Administered 2014-04-10: 2 via ORAL
  Filled 2014-04-10: qty 2

## 2014-04-10 MED ORDER — HYDROCODONE-ACETAMINOPHEN 5-325 MG PO TABS: 1.0000 | ORAL_TABLET | ORAL | Status: DC | PRN

## 2014-04-10 NOTE — ED Provider Notes (Signed)
CSN: [id number]     Arrival date & time 04/10/14  1914 History   First MD Initiated Contact with Patient 04/10/14 2033     Chief Complaint  Patient presents with  . Knee Pain     (Consider location/radiation/quality/duration/timing/severity/associated sxs/prior Treatment) Patient is a 53 y.o. female presenting with knee pain. The history is provided by the patient and medical records. No language interpreter was used.  Knee Pain Associated symptoms: no back pain, no fever and no neck pain     Emily Osborne is a 53 y.o. female  with a hx of asthma, fibromyalgia, hypertension, chronic joint pain presents to the Emergency Department complaining of gradual, persistent, progressively worsening right knee pain onset around noon after shooting basketball this morning. Associated symptoms include swelling and pain with ambulation.  Patient reports using her Lidoderm patch and taking Tylenol without relief.  Patient reports she twisted the knee several months ago and was given a cortisone injection in July by Dr. Marlou Sa. She reports the knee was better until today. Rest makes it better and ambulation makes it worse.  Pt denies fever, chills, numbness, weakness, tingling.  Patient denies falling or hitting her knee.  OrthoAlphonzo Severance MD   Past Medical History  Diagnosis Date  . Asthma   . Fibromyalgia   . IBS (irritable bowel syndrome)   . GERD (gastroesophageal reflux disease)   . Hyperlipidemia   . Hypertension   . Personal history of other diseases of digestive system   . Other acute reactions to stress   . Pain in joint, upper arm   . Pain in joint, shoulder region   . Elevated blood pressure reading without diagnosis of hypertension   . Edema   . Pain in limb   . Herpes zoster without mention of complication   . Nontraumatic rupture of Achilles tendon   . Ganglion of joint     right wrist   Past Surgical History  Procedure Laterality Date  . Abdominal hysterectomy    . Tubal  ligation    . Foot surgery      Bunionectomy 1983  . Achilles tendon repair  2005  . Cystectomy      ganglion   Family History  Problem Relation Age of Onset  . Coronary artery disease    . Hyperlipidemia    . Kidney disease    . Hypertension    . Diabetes Father   . Heart disease Father   . Kidney disease Father     Died, 42  . Leukemia Brother   . Cancer Brother     myleoblastic anemia  . Hypertension Mother     Died, 34  . Thyroid disease Mother     Thyroid surgery  . Pernicious anemia Sister   . Thyroid disease Sister     On thyroid Rx  . Lupus Daughter    History  Substance Use Topics  . Smoking status: Never Smoker   . Smokeless tobacco: Never Used  . Alcohol Use: No   OB History   Grav Para Term Preterm Abortions TAB SAB Ect Mult Living                 Review of Systems  Constitutional: Negative for fever and chills.  Gastrointestinal: Negative for nausea and vomiting.  Musculoskeletal: Positive for arthralgias and joint swelling. Negative for back pain, neck pain and neck stiffness.  Skin: Negative for wound.  Neurological: Negative for numbness.  Hematological: Does not bruise/bleed  easily.  Psychiatric/Behavioral: The patient is not nervous/anxious.   All other systems reviewed and are negative.     Allergies  Gentamicin; Aspirin; Doxycycline; Ibuprofen; Influenza a (h1n1) monoval vac; Metronidazole; Nsaids; Ranitidine hcl; Sulfamethoxazole-trimethoprim; and Tramadol hcl  Home Medications   Prior to Admission medications   Medication Sig Start Date End Date Taking? Authorizing Provider  acetaminophen (TYLENOL) 500 MG tablet Take 500 mg by mouth every 6 (six) hours as needed.      Historical Provider, MD  albuterol (PROAIR HFA) 108 (90 BASE) MCG/ACT inhaler USE 2 PUFFS EVERY 4 HOURS AS NEEDED FOR COUGH, WHEEZE OR SHORTNESS OF BREATH. 07/14/13   Rosalita Chessman, DO  ALPRAZolam (XANAX) 0.25 MG tablet Take 1 tablet (0.25 mg total) by mouth 3 (three)  times daily as needed. 07/14/13   Rosalita Chessman, DO  Calcium Citrate-Vitamin D3 1000-400 LIQD 1 po qd 09/14/13   Rosalita Chessman, DO  Cyanocobalamin (B-12) 1000 MCG SUBL 1 sl qd 09/14/13   Rosalita Chessman, DO  cyclobenzaprine (FLEXERIL) 10 MG tablet Take 10 mg by mouth 3 (three) times daily as needed.      Historical Provider, MD  DEXILANT 60 MG capsule TAKE 1 CAPSULE BY MOUTH 30 MIN PRIOR TO BREAKFAST 09/19/13   Inda Castle, MD  fexofenadine (ALLEGRA) 180 MG tablet Take 1 tablet (180 mg total) by mouth daily. 09/14/13   Rosalita Chessman, DO  Fluticasone-Salmeterol (ADVAIR DISKUS) 250-50 MCG/DOSE AEPB Inhale 1 puff into the lungs 2 (two) times daily.      Historical Provider, MD  glucose blood (FREESTYLE LITE) test strip 1 each by Other route as needed. Use as instructed     Historical Provider, MD  HYDROcodone-acetaminophen (NORCO/VICODIN) 5-325 MG per tablet Take 1-2 tablets by mouth every 4 (four) hours as needed for moderate pain or severe pain. 04/10/14   Riordan Walle, PA-C  Hydrocodone-Acetaminophen (VICODIN) 5-300 MG TABS 1 po q6h prn pain 07/14/13   Yvonne R Lowne, DO  lidocaine (LIDODERM) 5 % APPLY 1 PATCH ONTO SKIN EVERY 12 HOURS REMOVE AND DISCARD PATCH WITHIN 12 HOURS OR AS DIRECTED BY MD 10/19/13   Rosalita Chessman, DO  mometasone (NASONEX) 50 MCG/ACT nasal spray Place 2 sprays into the nose daily. 09/16/13   Rosalita Chessman, DO  NONFORMULARY OR COMPOUNDED ITEM Compression stockings 20-30 mmhg,  Edema #1 09/02/13   Rosalita Chessman, DO  valACYclovir (VALTREX) 1000 MG tablet TAKE 2 TABLETS BY MOUTH TWICE A DAY FOR 1 DAY AND AS DIRECTED 09/02/13   Alferd Apa Lowne, DO   BP 120/86  Pulse 66  Temp(Src) 98.1 F (36.7 C) (Oral)  Resp 16  SpO2 100% Physical Exam  Nursing note and vitals reviewed. Constitutional: She appears well-developed and well-nourished. No distress.  HENT:  Head: Normocephalic and atraumatic.  Eyes: Conjunctivae are normal.  Neck: Normal range of motion.   Cardiovascular: Normal rate, regular rhythm, normal heart sounds and intact distal pulses.   No murmur heard. Capillary refill less than 3 seconds  Pulmonary/Chest: Effort normal and breath sounds normal.  Musculoskeletal: She exhibits tenderness. She exhibits no edema.  ROM: Mildly decreased range of motion in the right knee with full extension and decreased flexion Joint effusion visible and palpable No erythema, increased warmth or induration Patellar tendon intact Negative Thompson's test  Neurological: She is alert. Coordination normal.  Sensation intact to both sharp and bilateral lower extremities Strength 5/5 in the right lower extremity except the knee which is  3/5 resisted flexion and extension due to pain  Skin: Skin is warm and dry. No rash noted. She is not diaphoretic. No erythema.  No tenting of the skin  Psychiatric: She has a normal mood and affect.    ED Course  Procedures (including critical care time) Labs Review Labs Reviewed - No data to display  Imaging Review No results found.   EKG Interpretation None      MDM   Final diagnoses:  Arthralgia of knee, right  Joint effusion, knee, right   CAYDEE TALKINGTON presents with right knee pain after shooting the basketball earlier today. She reports she did not jump or fall.  She reports recently spraining the same knee and being given a cortisone shot without problems until today.  Pt with mild swelling to the joint spaces, knee swelling, tightness in the knee, and mildly restricted range of motion. Pt unable to perform full flexion of the knee.  Pt is without systemic symptoms, erythema or redness of the joint consistent with gout or septic joint.  Patient without trauma, highly doubt fracture or dislocation. We'll not image at this time. Pain managed in ED. Pt advised to follow up with orthopedics if symptoms persist for further evaluation and treatment. Patient given brace while in ED, conservative therapy  recommended and discussed. Patient will be dc home & is agreeable with above plan.  I have personally reviewed patient's vitals, nursing note and any pertinent labs or imaging.  I performed an undressed physical exam.    At this time, it has been determined that no acute conditions requiring further emergency intervention. The patient/guardian have been advised of the diagnosis and plan. I reviewed all labs and imaging including any potential incidental findings. We have discussed signs and symptoms that warrant return to the ED, such as fevers, worsening pain, erythema, decreasing range of motion.  Patient/guardian has voiced understanding and agreed to follow-up with the PCP or specialist in 3 days.  Vital signs are stable at discharge.   BP 120/86  Pulse 66  Temp(Src) 98.1 F (36.7 C) (Oral)  Resp 16  SpO2 100%           Abigail Butts, PA-C 04/10/14 2130

## 2014-04-10 NOTE — Discharge Instructions (Signed)
1. Medications: vicodin, usual home medications 2. Treatment: rest, drink plenty of fluids, ice, elevate, wear brace, use crutches as needed; weight bearing as tolerated 3. Follow Up: Please followup with Dr. Marlou Sa in 3 days    Knee Effusion The medical term for having fluid in your knee is effusion. This is often due to an internal derangement of the knee. This means something is wrong inside the knee. Some of the causes of fluid in the knee may be torn cartilage, a torn ligament, or bleeding into the joint from an injury. Your knee is likely more difficult to bend and move. This is often because there is increased pain and pressure in the joint. The time it takes for recovery from a knee effusion depends on different factors, including:   Type of injury.  Your age.  Physical and medical conditions.  Rehabilitation Strategies. How long you will be away from your normal activities will depend on what kind of knee problem you have and how much damage is present. Your knee has two types of cartilage. Articular cartilage covers the bone ends and lets your knee bend and move smoothly. Two menisci, thick pads of cartilage that form a rim inside the joint, help absorb shock and stabilize your knee. Ligaments bind the bones together and support your knee joint. Muscles move the joint, help support your knee, and take stress off the joint itself. CAUSES  Often an effusion in the knee is caused by an injury to one of the menisci. This is often a tear in the cartilage. Recovery after a meniscus injury depends on how much meniscus is damaged and whether you have damaged other knee tissue. Small tears may heal on their own with conservative treatment. Conservative means rest, limited weight bearing activity and muscle strengthening exercises. Your recovery may take up to 6 weeks.  TREATMENT  Larger tears may require surgery. Meniscus injuries may be treated during arthroscopy. Arthroscopy is a procedure in  which your surgeon uses a small telescope like instrument to look in your knee. Your caregiver can make a more accurate diagnosis (learning what is wrong) by performing an arthroscopic procedure. If your injury is on the inner margin of the meniscus, your surgeon may trim the meniscus back to a smooth rim. In other cases your surgeon will try to repair a damaged meniscus with stitches (sutures). This may make rehabilitation take longer, but may provide better long term result by helping your knee keep its shock absorption capabilities. Ligaments which are completely torn usually require surgery for repair. HOME CARE INSTRUCTIONS  Use crutches as instructed.  If a brace is applied, use as directed.  Once you are home, an ice pack applied to your swollen knee may help with discomfort and help decrease swelling.  Keep your knee raised (elevated) when you are not up and around or on crutches.  Only take over-the-counter or prescription medicines for pain, discomfort, or fever as directed by your caregiver.  Your caregivers will help with instructions for rehabilitation of your knee. This often includes strengthening exercises.  You may resume a normal diet and activities as directed. SEEK MEDICAL CARE IF:   There is increased swelling in your knee.  You notice redness, swelling, or increasing pain in your knee.  An unexplained oral temperature above 102 F (38.9 C) develops. SEEK IMMEDIATE MEDICAL CARE IF:   You develop a rash.  You have difficulty breathing.  You have any allergic reactions from medications you may have been given.  There is severe pain with any motion of the knee. MAKE SURE YOU:   Understand these instructions.  Will watch your condition.  Will get help right away if you are not doing well or get worse. Document Released: 10/27/2003 Document Revised: 10/29/2011 Document Reviewed: 12/31/2007 [facility] Patient Information 2015 [place], [place]. This information  is not intended to replace advice given to you by your health care provider. Make sure you discuss any questions you have with your health care provider.

## 2014-04-10 NOTE — ED Notes (Signed)
Knee sleeve applied

## 2014-04-10 NOTE — ED Notes (Signed)
The pt is c/o rt knee pain after shooting basketballs this am.  She has had problems with her knee previously

## 2014-04-12 NOTE — ED Provider Notes (Signed)
Medical screening examination/treatment/procedure(s) were performed by non-physician practitioner and as supervising physician I was immediately available for consultation/collaboration.   EKG Interpretation None        Sharyon Cable, MD 04/12/14 [phone]

## 2014-04-20 HISTORY — PX: MENISCUS REPAIR: SHX5179

## 2014-04-21 ENCOUNTER — Encounter: Payer: Self-pay | Admitting: Family Medicine

## 2014-04-22 NOTE — Telephone Encounter (Signed)
Did you get the PA for this patient,      KP

## 2014-05-05 ENCOUNTER — Other Ambulatory Visit: Payer: Self-pay | Admitting: Family Medicine

## 2014-05-06 NOTE — Telephone Encounter (Signed)
Last seen 09/16/13 and filled 07/14/13 #30 with 1.   Please advise      KP

## 2014-05-17 ENCOUNTER — Telehealth: Payer: Self-pay | Admitting: Family Medicine

## 2014-05-17 NOTE — Telephone Encounter (Signed)
Ok to give note 

## 2014-05-17 NOTE — Telephone Encounter (Signed)
Caller name: Babbie  Relation to pt: self  Call back number: [phone]   Reason for call:   pt requesting a letter excusing her from the taking the flu shot. Pt states your aware of the bad reaction from the h1n1. Pt stated the letter needs to be dated before the end of this month, no later then 9/30.

## 2014-05-17 NOTE — Telephone Encounter (Signed)
Patient aware letter ready for pick up.      KP 

## 2014-06-08 ENCOUNTER — Telehealth: Payer: Self-pay | Admitting: Family Medicine

## 2014-06-08 MED ORDER — FREESTYLE FREEDOM KIT: 1.0000 | PACK | Freq: Every day | Status: AC

## 2014-06-08 MED ORDER — GLUCOSE BLOOD VI STRP: ORAL_STRIP | Status: DC

## 2014-06-08 NOTE — Telephone Encounter (Signed)
Pt is needing new rx for glucose blood (FREESTYLE LITE) test strip Pt states she would like a new rx for a new meter as well if possible because hers is old. States dr.lowne only had her checking her sugar periodically, states she is not a diabetic but her sugar drops sometimes. Pharmacy had informed her that it was been to long so she needed a new rx to get the test strips.

## 2014-07-06 ENCOUNTER — Ambulatory Visit (INDEPENDENT_AMBULATORY_CARE_PROVIDER_SITE_OTHER): Payer: BC Managed Care – PPO

## 2014-07-06 DIAGNOSIS — Z111 Encounter for screening for respiratory tuberculosis: Secondary | ICD-10-CM

## 2014-07-06 NOTE — Patient Instructions (Signed)
Return for TB read between [place] and 9am Friday

## 2014-07-08 LAB — TB SKIN TEST
INDURATION: 0 mm
TB Skin Test: NEGATIVE

## 2014-09-07 ENCOUNTER — Ambulatory Visit (INDEPENDENT_AMBULATORY_CARE_PROVIDER_SITE_OTHER): Payer: BC Managed Care – PPO | Admitting: Family Medicine

## 2014-09-07 ENCOUNTER — Other Ambulatory Visit (HOSPITAL_COMMUNITY)
Admission: RE | Admit: 2014-09-07 | Discharge: 2014-09-07 | Disposition: A | Discharge status: Home / Self Care | Payer: BC Managed Care – PPO | Source: Ambulatory Visit | Attending: Family Medicine | Admitting: Family Medicine

## 2014-09-07 ENCOUNTER — Encounter: Payer: Self-pay | Admitting: Family Medicine

## 2014-09-07 VITALS — BP 96/60 | HR 84 | Temp 98.4°F | Ht 65.0 in | Wt 231.0 lb

## 2014-09-07 DIAGNOSIS — E559 Vitamin D deficiency, unspecified: Secondary | ICD-10-CM

## 2014-09-07 DIAGNOSIS — E538 Deficiency of other specified B group vitamins: Secondary | ICD-10-CM

## 2014-09-07 DIAGNOSIS — Z124 Encounter for screening for malignant neoplasm of cervix: Secondary | ICD-10-CM

## 2014-09-07 DIAGNOSIS — Z0000 Encounter for general adult medical examination without abnormal findings: Secondary | ICD-10-CM

## 2014-09-07 DIAGNOSIS — Z01419 Encounter for gynecological examination (general) (routine) without abnormal findings: Secondary | ICD-10-CM | POA: Insufficient documentation

## 2014-09-07 DIAGNOSIS — Z1151 Encounter for screening for human papillomavirus (HPV): Secondary | ICD-10-CM | POA: Insufficient documentation

## 2014-09-07 DIAGNOSIS — J4520 Mild intermittent asthma, uncomplicated: Secondary | ICD-10-CM

## 2014-09-07 DIAGNOSIS — J302 Other seasonal allergic rhinitis: Secondary | ICD-10-CM

## 2014-09-07 LAB — BASIC METABOLIC PANEL
BUN: 12 mg/dL (ref 6–23)
CO2: 30 mEq/L (ref 19–32)
Calcium: 9.5 mg/dL (ref 8.4–10.5)
Chloride: 104 mEq/L (ref 96–112)
Creatinine, Ser: 0.94 mg/dL (ref 0.40–1.20)
GFR: 79.62 mL/min (ref >60.00)
Glucose, Bld: 94 mg/dL (ref 70–99)
Potassium: 4 mEq/L (ref 3.5–5.1)
Sodium: 138 mEq/L (ref 135–145)

## 2014-09-07 LAB — POCT URINALYSIS DIPSTICK
Bilirubin, UA: NEGATIVE
Blood, UA: NEGATIVE
Glucose, UA: NEGATIVE
Ketones, UA: NEGATIVE
Leukocytes, UA: NEGATIVE
Nitrite, UA: NEGATIVE
PH UA: 6
Spec Grav, UA: 1.02
Urobilinogen, UA: NEGATIVE

## 2014-09-07 LAB — CBC WITH DIFFERENTIAL/PLATELET
Basophils Absolute: 0 10*3/uL (ref 0.0–0.1)
Basophils Relative: 0.3 % (ref 0.0–3.0)
Eosinophils Absolute: 0.1 10*3/uL (ref 0.0–0.7)
Eosinophils Relative: 1.4 % (ref 0.0–5.0)
HEMATOCRIT: 41.5 % (ref 36.0–46.0)
Hemoglobin: 13.7 g/dL (ref 12.0–15.0)
LYMPHS PCT: 37 % (ref 12.0–46.0)
Lymphs Abs: 2.2 10*3/uL (ref 0.7–4.0)
MCHC: 33.1 g/dL (ref 30.0–36.0)
MCV: 87.7 fl (ref 78.0–100.0)
MONO ABS: 0.5 10*3/uL (ref 0.1–1.0)
Monocytes Relative: 8.8 % (ref 3.0–12.0)
Neutro Abs: 3.2 10*3/uL (ref 1.4–7.7)
Neutrophils Relative %: 52.5 % (ref 43.0–77.0)
Platelets: 285 10*3/uL (ref 150.0–400.0)
RBC: 4.74 Mil/uL (ref 3.87–5.11)
RDW: 14.3 % (ref 11.5–15.5)
WBC: 6 10*3/uL (ref 4.0–10.5)

## 2014-09-07 LAB — HEPATIC FUNCTION PANEL
ALK PHOS: 74 U/L (ref 39–117)
ALT: 31 U/L (ref 0–35)
AST: 27 U/L (ref 0–37)
Albumin: 4.1 g/dL (ref 3.5–5.2)
Bilirubin, Direct: 0.1 mg/dL (ref 0.0–0.3)
Total Bilirubin: 0.5 mg/dL (ref 0.2–1.2)
Total Protein: 7.2 g/dL (ref 6.0–8.3)

## 2014-09-07 LAB — LIPID PANEL
CHOL/HDL RATIO: 4
Cholesterol: 174 mg/dL (ref 0–200)
HDL: 46.1 mg/dL (ref >39.00)
LDL CALC: 108 mg/dL — AB (ref 0–99)
NonHDL: 127.9
Triglycerides: 101 mg/dL (ref 0.0–149.0)
VLDL: 20.2 mg/dL (ref 0.0–40.0)

## 2014-09-07 LAB — TSH: TSH: 0.87 u[IU]/mL (ref 0.35–4.50)

## 2014-09-07 LAB — VITAMIN B12: VITAMIN B 12: 820 pg/mL (ref 211–911)

## 2014-09-07 MED ORDER — FLUTICASONE-SALMETEROL 250-50 MCG/DOSE IN AEPB: 1.0000 | INHALATION_SPRAY | Freq: Two times a day (BID) | RESPIRATORY_TRACT | Status: DC

## 2014-09-07 MED ORDER — MOMETASONE FUROATE 50 MCG/ACT NA SUSP: 2.0000 | Freq: Every day | NASAL | Status: DC

## 2014-09-07 NOTE — Progress Notes (Signed)
Subjective:     Emily Osborne is a 54 y.o. female and is here for a comprehensive physical exam. The patient reports no problems.  History   Social History  . Marital Status: Divorced    Spouse Name: N/A    Number of Children: N/A  . Years of Education: N/A   Occupational History  . Not on file.   Social History Main Topics  . Smoking status: Never Smoker   . Smokeless tobacco: Never Used  . Alcohol Use: No  . Drug Use: No  . Sexual Activity:    Partners: Male   Other Topics Concern  . Not on file   Social History Narrative   Lives with alone.  She has two daughter.   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 COLONOSCOPY  10/06/2015  . MAMMOGRAM  10/31/2015  . PAP SMEAR  09/02/2016  . TETANUS/TDAP  09/07/2020    The following portions of the patient's history were reviewed and updated as appropriate:  She  has a past medical history of Asthma; Fibromyalgia; IBS (irritable bowel syndrome); GERD (gastroesophageal reflux disease); Hyperlipidemia; Hypertension; Personal history of other diseases of digestive system; Other acute reactions to stress; Pain in joint, upper arm; Pain in joint, shoulder region; Elevated blood pressure reading without diagnosis of hypertension; Edema; Pain in limb; Herpes zoster without mention of complication; Nontraumatic rupture of Achilles tendon; and Ganglion of joint. She  does not have any pertinent problems on file. She  has past surgical history that includes Abdominal hysterectomy; Tubal ligation; Foot surgery; Achilles tendon repair (2005); and Cystectomy. Her family history includes Cancer in her brother; Coronary artery disease in an other family member; Diabetes in her father; Heart disease in her father; Hyperlipidemia in an other family member; Hypertension in her mother and another family member; Kidney disease in her father and another family  member; Leukemia in her brother; Lupus in her daughter; Pernicious anemia in her sister; Thyroid disease in her mother and sister. She  reports that she has never smoked. She has never used smokeless tobacco. She reports that she does not drink alcohol or use illicit drugs. She has a current medication list which includes the following prescription(s): acetaminophen, albuterol, alprazolam, freestyle freedom, calcium citrate-vitamin d3, b-12, dexilant, fexofenadine, fluticasone-salmeterol, glucose blood, lidocaine, mometasone, NONFORMULARY OR COMPOUNDED ITEM, and valacyclovir. Current Outpatient Prescriptions on File Prior to Visit  Medication Sig Dispense Refill  . acetaminophen (TYLENOL) 500 MG tablet Take 500 mg by mouth every 6 (six) hours as needed.      Marland Kitchen albuterol (PROAIR HFA) 108 (90 BASE) MCG/ACT inhaler USE 2 PUFFS EVERY 4 HOURS AS NEEDED FOR COUGH, WHEEZE OR SHORTNESS OF BREATH. 8.5 each 2  . ALPRAZolam (XANAX) 0.25 MG tablet TAKE 1 TABLET BY MOUTH 3 TIMES A DAY AS NEEDED 30 tablet 1  . Blood Glucose Monitoring Suppl (FREESTYLE FREEDOM) KIT 1 Device by Does not apply route daily. 1 each 0  . Calcium Citrate-Vitamin D3 1000-400 LIQD 1 po qd 480 mL 5  . Cyanocobalamin (B-12) 1000 MCG SUBL 1 sl qd 90 each 3  . DEXILANT 60 MG capsule TAKE 1 CAPSULE BY MOUTH 30 MIN PRIOR TO BREAKFAST 30 capsule 11  . fexofenadine (ALLEGRA) 180 MG tablet Take 1 tablet (180 mg total) by mouth daily. 90 tablet 3  . Fluticasone-Salmeterol (ADVAIR DISKUS) 250-50 MCG/DOSE AEPB Inhale 1 puff  into the lungs 2 (two) times daily.      Marland Kitchen glucose blood (FREESTYLE LITE) test strip Check blood sugar once daily 100 each 2  . lidocaine (LIDODERM) 5 % APPLY 1 PATCH ONTO SKIN EVERY 12 HOURS REMOVE AND DISCARD PATCH WITHIN 12 HOURS OR AS DIRECTED BY MD 30 patch 0  . mometasone (NASONEX) 50 MCG/ACT nasal spray Place 2 sprays into the nose daily. 17 g 12  . NONFORMULARY OR COMPOUNDED ITEM Compression stockings 20-30 mmhg,  Edema #1  1 each 1  . valACYclovir (VALTREX) 1000 MG tablet TAKE 2 TABLETS BY MOUTH TWICE A DAY FOR 1 DAY AND AS DIRECTED 30 tablet 5   No current facility-administered medications on file prior to visit.   She is allergic to gentamicin; aspirin; doxycycline; ibuprofen; influenza a (h1n1) monoval vac; metronidazole; nsaids; ranitidine hcl; sulfamethoxazole-trimethoprim; and tramadol hcl..  Review of Systems  Review of Systems  Constitutional: Negative for activity change, appetite change and fatigue.  HENT: Negative for hearing loss, congestion, tinnitus and ear discharge.  dentist [id number]: Negative for visual disturbance (see optho q1y -- vision corrected to 20/20 with glasses).  Respiratory: Negative for cough, chest tightness and shortness of breath.   Cardiovascular: Negative for chest pain, palpitations and leg swelling.  Gastrointestinal: Negative for abdominal pain, diarrhea, constipation and abdominal distention.  Genitourinary: Negative for urgency, frequency, decreased urine volume and difficulty urinating.  Musculoskeletal: Negative for back pain, arthralgias and gait problem.  Skin: Negative for color change, pallor and rash.  Neurological: Negative for dizziness, light-headedness, numbness and headaches.  Hematological: Negative for adenopathy. Does not bruise/bleed easily.  Psychiatric/Behavioral: Negative for suicidal ideas, confusion, sleep disturbance, self-injury, dysphoric mood, decreased concentration and agitation.      Objective:    BP 96/60 mmHg  Pulse 84  Temp(Src) 98.4 F (36.9 C) (Oral)  Ht 5' 5"  (1.651 m)  Wt 231 lb (104.781 kg)  BMI 38.44 kg/m2  SpO2 97% General appearance: alert, cooperative, appears stated age and no distress Head: Normocephalic, without obvious abnormality, atraumatic Eyes: conjunctivae/corneas clear. PERRL, EOM's intact. Fundi benign. Ears: normal TM's and external ear canals both ears Nose: Nares normal. Septum midline. Mucosa normal.  No drainage or sinus tenderness. Throat: lips, mucosa, and tongue normal; teeth and gums normal Neck: no adenopathy, no carotid bruit, no JVD, supple, symmetrical, trachea midline and thyroid not enlarged, symmetric, no tenderness/mass/nodules Back: symmetric, no curvature. ROM normal. No CVA tenderness. Lungs: clear to auscultation bilaterally Breasts: normal appearance, no masses or tenderness Heart: regular rate and rhythm, S1, S2 normal, no murmur, click, rub or gallop Abdomen: soft, non-tender; bowel sounds normal; no masses,  no organomegaly Pelvic: cervix normal in appearance, external genitalia normal, no adnexal masses or tenderness, no cervical motion tenderness, rectovaginal septum normal, uterus normal size, shape, and consistency, vagina normal without discharge and pap done,  rectal-- heme neg brown stool Extremities: extremities normal, atraumatic, no cyanosis or edema Pulses: 2+ and symmetric Skin: Skin color, texture, turgor normal. No rashes or lesions Lymph nodes: Cervical, supraclavicular, and axillary nodes normal. Neurologic: Alert and oriented X 3, normal strength and tone. Normal symmetric reflexes. Normal coordination and gait Psych- no depression, no anxiety      Assessment:    Healthy female exam.      Plan:    ghm utd Check labs See After Visit Summary for Counseling Recommendations    1. Asthma, chronic, mild intermittent, uncomplicated stable - Fluticasone-Salmeterol (ADVAIR DISKUS) 250-50 MCG/DOSE AEPB; Inhale 1 puff into  the lungs 2 (two) times daily.  Dispense: 60 each; Refill: 5  2. Seasonal allergies  - mometasone (NASONEX) 50 MCG/ACT nasal spray; Place 2 sprays into the nose daily.  Dispense: 17 g; Refill: 12  3. B12 deficiency  - Vitamin B12  4. Vitamin D deficiency   - Vitamin D 1,25 dihydroxy  5. Preventative health care   - Basic metabolic panel - CBC with Differential - Hepatic function panel - Lipid panel - POCT urinalysis  dipstick - TSH  6. Screening for cervical cancer   - Cytology - PAP

## 2014-09-07 NOTE — Progress Notes (Signed)
Pre visit review using our clinic review tool, if applicable. No additional management support is needed unless otherwise documented below in the visit note. 

## 2014-09-07 NOTE — Patient Instructions (Signed)
Preventive Care for Adults A healthy lifestyle and preventive care can promote health and wellness. Preventive health guidelines for women include the following key practices.  A routine yearly physical is a good way to check with your health care provider about your health and preventive screening. It is a chance to share any concerns and updates on your health and to receive a thorough exam.  Visit your dentist for a routine exam and preventive care every 6 months. Brush your teeth twice a day and floss once a day. Good oral hygiene prevents tooth decay and gum disease.  The frequency of eye exams is based on your age, health, family medical history, use of contact lenses, and other factors. Follow your health care provider's recommendations for frequency of eye exams.  Eat a healthy diet. Foods like vegetables, fruits, whole grains, low-fat dairy products, and lean protein foods contain the nutrients you need without too many calories. Decrease your intake of foods high in solid fats, added sugars, and salt. Eat the right amount of calories for you.Get information about a proper diet from your health care provider, if necessary.  Regular physical exercise is one of the most important things you can do for your health. Most adults should get at least 150 minutes of moderate-intensity exercise (any activity that increases your heart rate and causes you to sweat) each week. In addition, most adults need muscle-strengthening exercises on 2 or more days a week.  Maintain a healthy weight. The body mass index (BMI) is a screening tool to identify possible weight problems. It provides an estimate of body fat based on height and weight. Your health care provider can find your BMI and can help you achieve or maintain a healthy weight.For adults 20 years and older:  A BMI below 18.5 is considered underweight.  A BMI of 18.5 to 24.9 is normal.  A BMI of 25 to 29.9 is considered overweight.  A BMI of  30 and above is considered obese.  Maintain normal blood lipids and cholesterol levels by exercising and minimizing your intake of saturated fat. Eat a balanced diet with plenty of fruit and vegetables. Blood tests for lipids and cholesterol should begin at age 76 and be repeated every 5 years. If your lipid or cholesterol levels are high, you are over 50, or you are at high risk for heart disease, you may need your cholesterol levels checked more frequently.Ongoing high lipid and cholesterol levels should be treated with medicines if diet and exercise are not working.  If you smoke, find out from your health care provider how to quit. If you do not use tobacco, do not start.  Lung cancer screening is recommended for adults aged 22-80 years who are at high risk for developing lung cancer because of a history of smoking. A yearly low-dose CT scan of the lungs is recommended for people who have at least a 30-pack-year history of smoking and are a current smoker or have quit within the past 15 years. A pack year of smoking is smoking an average of 1 pack of cigarettes a day for 1 year (for example: 1 pack a day for 30 years or 2 packs a day for 15 years). Yearly screening should continue until the smoker has stopped smoking for at least 15 years. Yearly screening should be stopped for people who develop a health problem that would prevent them from having lung cancer treatment.  If you are pregnant, do not drink alcohol. If you are breastfeeding,  be very cautious about drinking alcohol. If you are not pregnant and choose to drink alcohol, do not have more than 1 drink per day. One drink is considered to be 12 ounces (355 mL) of beer, 5 ounces (148 mL) of wine, or 1.5 ounces (44 mL) of liquor.  Avoid use of street drugs. Do not share needles with anyone. Ask for help if you need support or instructions about stopping the use of drugs.  High blood pressure causes heart disease and increases the risk of  stroke. Your blood pressure should be checked at least every 1 to 2 years. Ongoing high blood pressure should be treated with medicines if weight loss and exercise do not work.  If you are 3-86 years old, ask your health care provider if you should take aspirin to prevent strokes.  Diabetes screening involves taking a blood sample to check your fasting blood sugar level. This should be done once every 3 years, after age 67, if you are within normal weight and without risk factors for diabetes. Testing should be considered at a younger age or be carried out more frequently if you are overweight and have at least 1 risk factor for diabetes.  Breast cancer screening is essential preventive care for women. You should practice "breast self-awareness." This means understanding the normal appearance and feel of your breasts and may include breast self-examination. Any changes detected, no matter how small, should be reported to a health care provider. Women in their 8s and 30s should have a clinical breast exam (CBE) by a health care provider as part of a regular health exam every 1 to 3 years. After age 70, women should have a CBE every year. Starting at age 25, women should consider having a mammogram (breast X-ray test) every year. Women who have a family history of breast cancer should talk to their health care provider about genetic screening. Women at a high risk of breast cancer should talk to their health care providers about having an MRI and a mammogram every year.  Breast cancer gene (BRCA)-related cancer risk assessment is recommended for women who have family members with BRCA-related cancers. BRCA-related cancers include breast, ovarian, tubal, and peritoneal cancers. Having family members with these cancers may be associated with an increased risk for harmful changes (mutations) in the breast cancer genes BRCA1 and BRCA2. Results of the assessment will determine the need for genetic counseling and  BRCA1 and BRCA2 testing.  Routine pelvic exams to screen for cancer are no longer recommended for nonpregnant women who are considered low risk for cancer of the pelvic organs (ovaries, uterus, and vagina) and who do not have symptoms. Ask your health care provider if a screening pelvic exam is right for you.  If you have had past treatment for cervical cancer or a condition that could lead to cancer, you need Pap tests and screening for cancer for at least 20 years after your treatment. If Pap tests have been discontinued, your risk factors (such as having a new sexual partner) need to be reassessed to determine if screening should be resumed. Some women have medical problems that increase the chance of getting cervical cancer. In these cases, your health care provider may recommend more frequent screening and Pap tests.  The HPV test is an additional test that may be used for cervical cancer screening. The HPV test looks for the virus that can cause the cell changes on the cervix. The cells collected during the Pap test can be  tested for HPV. The HPV test could be used to screen women aged 30 years and older, and should be used in women of any age who have unclear Pap test results. After the age of 30, women should have HPV testing at the same frequency as a Pap test.  Colorectal cancer can be detected and often prevented. Most routine colorectal cancer screening begins at the age of 50 years and continues through age 75 years. However, your health care provider may recommend screening at an earlier age if you have risk factors for colon cancer. On a yearly basis, your health care provider may provide home test kits to check for hidden blood in the stool. Use of a small camera at the end of a tube, to directly examine the colon (sigmoidoscopy or colonoscopy), can detect the earliest forms of colorectal cancer. Talk to your health care provider about this at age 50, when routine screening begins. Direct  exam of the colon should be repeated every 5-10 years through age 75 years, unless early forms of pre-cancerous polyps or small growths are found.  People who are at an increased risk for hepatitis B should be screened for this virus. You are considered at high risk for hepatitis B if:  You were born in a country where hepatitis B occurs often. Talk with your health care provider about which countries are considered high risk.  Your parents were born in a high-risk country and you have not received a shot to protect against hepatitis B (hepatitis B vaccine).  You have HIV or AIDS.  You use needles to inject street drugs.  You live with, or have sex with, someone who has hepatitis B.  You get hemodialysis treatment.  You take certain medicines for conditions like cancer, organ transplantation, and autoimmune conditions.  Hepatitis C blood testing is recommended for all people born from 1945 through 1965 and any individual with known risks for hepatitis C.  Practice safe sex. Use condoms and avoid high-risk sexual practices to reduce the spread of sexually transmitted infections (STIs). STIs include gonorrhea, chlamydia, syphilis, trichomonas, herpes, HPV, and human immunodeficiency virus (HIV). Herpes, HIV, and HPV are viral illnesses that have no cure. They can result in disability, cancer, and death.  You should be screened for sexually transmitted illnesses (STIs) including gonorrhea and chlamydia if:  You are sexually active and are younger than 24 years.  You are older than 24 years and your health care provider tells you that you are at risk for this type of infection.  Your sexual activity has changed since you were last screened and you are at an increased risk for chlamydia or gonorrhea. Ask your health care provider if you are at risk.  If you are at risk of being infected with HIV, it is recommended that you take a prescription medicine daily to prevent HIV infection. This is  called preexposure prophylaxis (PrEP). You are considered at risk if:  You are a heterosexual woman, are sexually active, and are at increased risk for HIV infection.  You take drugs by injection.  You are sexually active with a partner who has HIV.  Talk with your health care provider about whether you are at high risk of being infected with HIV. If you choose to begin PrEP, you should first be tested for HIV. You should then be tested every 3 months for as long as you are taking PrEP.  Osteoporosis is a disease in which the bones lose minerals and strength   with aging. This can result in serious bone fractures or breaks. The risk of osteoporosis can be identified using a bone density scan. Women ages 65 years and over and women at risk for fractures or osteoporosis should discuss screening with their health care providers. Ask your health care provider whether you should take a calcium supplement or vitamin D to reduce the rate of osteoporosis.  Menopause can be associated with physical symptoms and risks. Hormone replacement therapy is available to decrease symptoms and risks. You should talk to your health care provider about whether hormone replacement therapy is right for you.  Use sunscreen. Apply sunscreen liberally and repeatedly throughout the day. You should seek shade when your shadow is shorter than you. Protect yourself by wearing long sleeves, pants, a wide-brimmed hat, and sunglasses year round, whenever you are outdoors.  Once a month, do a whole body skin exam, using a mirror to look at the skin on your back. Tell your health care provider of new moles, moles that have irregular borders, moles that are larger than a pencil eraser, or moles that have changed in shape or color.  Stay current with required vaccines (immunizations).  Influenza vaccine. All adults should be immunized every year.  Tetanus, diphtheria, and acellular pertussis (Td, Tdap) vaccine. Pregnant women should  receive 1 dose of Tdap vaccine during each pregnancy. The dose should be obtained regardless of the length of time since the last dose. Immunization is preferred during the 27th-36th week of gestation. An adult who has not previously received Tdap or who does not know her vaccine status should receive 1 dose of Tdap. This initial dose should be followed by tetanus and diphtheria toxoids (Td) booster doses every 10 years. Adults with an unknown or incomplete history of completing a 3-dose immunization series with Td-containing vaccines should begin or complete a primary immunization series including a Tdap dose. Adults should receive a Td booster every 10 years.  Varicella vaccine. An adult without evidence of immunity to varicella should receive 2 doses or a second dose if she has previously received 1 dose. Pregnant females who do not have evidence of immunity should receive the first dose after pregnancy. This first dose should be obtained before leaving the health care facility. The second dose should be obtained 4-8 weeks after the first dose.  Human papillomavirus (HPV) vaccine. Females aged 13-26 years who have not received the vaccine previously should obtain the 3-dose series. The vaccine is not recommended for use in pregnant females. However, pregnancy testing is not needed before receiving a dose. If a female is found to be pregnant after receiving a dose, no treatment is needed. In that case, the remaining doses should be delayed until after the pregnancy. Immunization is recommended for any person with an immunocompromised condition through the age of 26 years if she did not get any or all doses earlier. During the 3-dose series, the second dose should be obtained 4-8 weeks after the first dose. The third dose should be obtained 24 weeks after the first dose and 16 weeks after the second dose.  Zoster vaccine. One dose is recommended for adults aged 60 years or older unless certain conditions are  present.  Measles, mumps, and rubella (MMR) vaccine. Adults born before 1957 generally are considered immune to measles and mumps. Adults born in 1957 or later should have 1 or more doses of MMR vaccine unless there is a contraindication to the vaccine or there is laboratory evidence of immunity to   each of the three diseases. A routine second dose of MMR vaccine should be obtained at least 28 days after the first dose for students attending postsecondary schools, health care workers, or international travelers. People who received inactivated measles vaccine or an unknown type of measles vaccine during 1963-1967 should receive 2 doses of MMR vaccine. People who received inactivated mumps vaccine or an unknown type of mumps vaccine before 1979 and are at high risk for mumps infection should consider immunization with 2 doses of MMR vaccine. For females of childbearing age, rubella immunity should be determined. If there is no evidence of immunity, females who are not pregnant should be vaccinated. If there is no evidence of immunity, females who are pregnant should delay immunization until after pregnancy. Unvaccinated health care workers born before 1957 who lack laboratory evidence of measles, mumps, or rubella immunity or laboratory confirmation of disease should consider measles and mumps immunization with 2 doses of MMR vaccine or rubella immunization with 1 dose of MMR vaccine.  Pneumococcal 13-valent conjugate (PCV13) vaccine. When indicated, a person who is uncertain of her immunization history and has no record of immunization should receive the PCV13 vaccine. An adult aged 19 years or older who has certain medical conditions and has not been previously immunized should receive 1 dose of PCV13 vaccine. This PCV13 should be followed with a dose of pneumococcal polysaccharide (PPSV23) vaccine. The PPSV23 vaccine dose should be obtained at least 8 weeks after the dose of PCV13 vaccine. An adult aged 19  years or older who has certain medical conditions and previously received 1 or more doses of PPSV23 vaccine should receive 1 dose of PCV13. The PCV13 vaccine dose should be obtained 1 or more years after the last PPSV23 vaccine dose.  Pneumococcal polysaccharide (PPSV23) vaccine. When PCV13 is also indicated, PCV13 should be obtained first. All adults aged 65 years and older should be immunized. An adult younger than age 65 years who has certain medical conditions should be immunized. Any person who resides in a nursing home or long-term care facility should be immunized. An adult smoker should be immunized. People with an immunocompromised condition and certain other conditions should receive both PCV13 and PPSV23 vaccines. People with human immunodeficiency virus (HIV) infection should be immunized as soon as possible after diagnosis. Immunization during chemotherapy or radiation therapy should be avoided. Routine use of PPSV23 vaccine is not recommended for American Indians, [place] Natives, or people younger than 65 years unless there are medical conditions that require PPSV23 vaccine. When indicated, people who have unknown immunization and have no record of immunization should receive PPSV23 vaccine. One-time revaccination 5 years after the first dose of PPSV23 is recommended for people aged 19-64 years who have chronic kidney failure, nephrotic syndrome, asplenia, or immunocompromised conditions. People who received 1-2 doses of PPSV23 before age 65 years should receive another dose of PPSV23 vaccine at age 65 years or later if at least 5 years have passed since the previous dose. Doses of PPSV23 are not needed for people immunized with PPSV23 at or after age 65 years.  Meningococcal vaccine. Adults with asplenia or persistent complement component deficiencies should receive 2 doses of quadrivalent meningococcal conjugate (MenACWY-D) vaccine. The doses should be obtained at least 2 months apart.  Microbiologists working with certain meningococcal bacteria, military recruits, people at risk during an outbreak, and people who travel to or live in countries with a high rate of meningitis should be immunized. A first-year college student up through age   21 years who is living in a residence hall should receive a dose if she did not receive a dose on or after her 16th birthday. Adults who have certain high-risk conditions should receive one or more doses of vaccine.  Hepatitis A vaccine. Adults who wish to be protected from this disease, have certain high-risk conditions, work with hepatitis A-infected animals, work in hepatitis A research labs, or travel to or work in countries with a high rate of hepatitis A should be immunized. Adults who were previously unvaccinated and who anticipate close contact with an international adoptee during the first 60 days after arrival in the [place] from a country with a high rate of hepatitis A should be immunized.  Hepatitis B vaccine. Adults who wish to be protected from this disease, have certain high-risk conditions, may be exposed to blood or other infectious body fluids, are household contacts or sex partners of hepatitis B positive people, are clients or workers in certain care facilities, or travel to or work in countries with a high rate of hepatitis B should be immunized.  Haemophilus influenzae type b (Hib) vaccine. A previously unvaccinated person with asplenia or sickle cell disease or having a scheduled splenectomy should receive 1 dose of Hib vaccine. Regardless of previous immunization, a recipient of a hematopoietic stem cell transplant should receive a 3-dose series 6-12 months after her successful transplant. Hib vaccine is not recommended for adults with HIV infection. Preventive Services / Frequency Ages 64 to 68 years  Blood pressure check.** / Every 1 to 2 years.  Lipid and cholesterol check.** / Every 5 years beginning at age  22.  Clinical breast exam.** / Every 3 years for women in their 88s and 53s.  BRCA-related cancer risk assessment.** / For women who have family members with a BRCA-related cancer (breast, ovarian, tubal, or peritoneal cancers).  Pap test.** / Every 2 years from ages 90 through 51. Every 3 years starting at age 21 through age 56 or 3 with a history of 3 consecutive normal Pap tests.  HPV screening.** / Every 3 years from ages 24 through ages 1 to 46 with a history of 3 consecutive normal Pap tests.  Hepatitis C blood test.** / For any individual with known risks for hepatitis C.  Skin self-exam. / Monthly.  Influenza vaccine. / Every year.  Tetanus, diphtheria, and acellular pertussis (Tdap, Td) vaccine.** / Consult your health care provider. Pregnant women should receive 1 dose of Tdap vaccine during each pregnancy. 1 dose of Td every 10 years.  Varicella vaccine.** / Consult your health care provider. Pregnant females who do not have evidence of immunity should receive the first dose after pregnancy.  HPV vaccine. / 3 doses over 6 months, if 72 and younger. The vaccine is not recommended for use in pregnant females. However, pregnancy testing is not needed before receiving a dose.  Measles, mumps, rubella (MMR) vaccine.** / You need at least 1 dose of MMR if you were born in 1957 or later. You may also need a 2nd dose. For females of childbearing age, rubella immunity should be determined. If there is no evidence of immunity, females who are not pregnant should be vaccinated. If there is no evidence of immunity, females who are pregnant should delay immunization until after pregnancy.  Pneumococcal 13-valent conjugate (PCV13) vaccine.** / Consult your health care provider.  Pneumococcal polysaccharide (PPSV23) vaccine.** / 1 to 2 doses if you smoke cigarettes or if you have certain conditions.  Meningococcal vaccine.** /  1 dose if you are age 19 to 21 years and a first-year college  student living in a residence hall, or have one of several medical conditions, you need to get vaccinated against meningococcal disease. You may also need additional booster doses.  Hepatitis A vaccine.** / Consult your health care provider.  Hepatitis B vaccine.** / Consult your health care provider.  Haemophilus influenzae type b (Hib) vaccine.** / Consult your health care provider. Ages 40 to 64 years  Blood pressure check.** / Every 1 to 2 years.  Lipid and cholesterol check.** / Every 5 years beginning at age 20 years.  Lung cancer screening. / Every year if you are aged 55-80 years and have a 30-pack-year history of smoking and currently smoke or have quit within the past 15 years. Yearly screening is stopped once you have quit smoking for at least 15 years or develop a health problem that would prevent you from having lung cancer treatment.  Clinical breast exam.** / Every year after age 40 years.  BRCA-related cancer risk assessment.** / For women who have family members with a BRCA-related cancer (breast, ovarian, tubal, or peritoneal cancers).  Mammogram.** / Every year beginning at age 40 years and continuing for as long as you are in good health. Consult with your health care provider.  Pap test.** / Every 3 years starting at age 30 years through age 65 or 70 years with a history of 3 consecutive normal Pap tests.  HPV screening.** / Every 3 years from ages 30 years through ages 65 to 70 years with a history of 3 consecutive normal Pap tests.  Fecal occult blood test (FOBT) of stool. / Every year beginning at age 50 years and continuing until age 75 years. You may not need to do this test if you get a colonoscopy every 10 years.  Flexible sigmoidoscopy or colonoscopy.** / Every 5 years for a flexible sigmoidoscopy or every 10 years for a colonoscopy beginning at age 50 years and continuing until age 75 years.  Hepatitis C blood test.** / For all people born from 1945 through  1965 and any individual with known risks for hepatitis C.  Skin self-exam. / Monthly.  Influenza vaccine. / Every year.  Tetanus, diphtheria, and acellular pertussis (Tdap/Td) vaccine.** / Consult your health care provider. Pregnant women should receive 1 dose of Tdap vaccine during each pregnancy. 1 dose of Td every 10 years.  Varicella vaccine.** / Consult your health care provider. Pregnant females who do not have evidence of immunity should receive the first dose after pregnancy.  Zoster vaccine.** / 1 dose for adults aged 60 years or older.  Measles, mumps, rubella (MMR) vaccine.** / You need at least 1 dose of MMR if you were born in 1957 or later. You may also need a 2nd dose. For females of childbearing age, rubella immunity should be determined. If there is no evidence of immunity, females who are not pregnant should be vaccinated. If there is no evidence of immunity, females who are pregnant should delay immunization until after pregnancy.  Pneumococcal 13-valent conjugate (PCV13) vaccine.** / Consult your health care provider.  Pneumococcal polysaccharide (PPSV23) vaccine.** / 1 to 2 doses if you smoke cigarettes or if you have certain conditions.  Meningococcal vaccine.** / Consult your health care provider.  Hepatitis A vaccine.** / Consult your health care provider.  Hepatitis B vaccine.** / Consult your health care provider.  Haemophilus influenzae type b (Hib) vaccine.** / Consult your health care provider. Ages 65   years and over  Blood pressure check.** / Every 1 to 2 years.  Lipid and cholesterol check.** / Every 5 years beginning at age 12 years.  Lung cancer screening. / Every year if you are aged 60-80 years and have a 30-pack-year history of smoking and currently smoke or have quit within the past 15 years. Yearly screening is stopped once you have quit smoking for at least 15 years or develop a health problem that would prevent you from having lung cancer  treatment.  Clinical breast exam.** / Every year after age 4 years.  BRCA-related cancer risk assessment.** / For women who have family members with a BRCA-related cancer (breast, ovarian, tubal, or peritoneal cancers).  Mammogram.** / Every year beginning at age 51 years and continuing for as long as you are in good health. Consult with your health care provider.  Pap test.** / Every 3 years starting at age 10 years through age 60 or 64 years with 3 consecutive normal Pap tests. Testing can be stopped between 65 and 70 years with 3 consecutive normal Pap tests and no abnormal Pap or HPV tests in the past 10 years.  HPV screening.** / Every 3 years from ages 47 years through ages 30 or 64 years with a history of 3 consecutive normal Pap tests. Testing can be stopped between 65 and 70 years with 3 consecutive normal Pap tests and no abnormal Pap or HPV tests in the past 10 years.  Fecal occult blood test (FOBT) of stool. / Every year beginning at age 49 years and continuing until age 65 years. You may not need to do this test if you get a colonoscopy every 10 years.  Flexible sigmoidoscopy or colonoscopy.** / Every 5 years for a flexible sigmoidoscopy or every 10 years for a colonoscopy beginning at age 34 years and continuing until age 52 years.  Hepatitis C blood test.** / For all people born from [birth year] through 1966 and any individual with known risks for hepatitis C.  Osteoporosis screening.** / A one-time screening for women ages 27 years and over and women at risk for fractures or osteoporosis.  Skin self-exam. / Monthly.  Influenza vaccine. / Every year.  Tetanus, diphtheria, and acellular pertussis (Tdap/Td) vaccine.** / 1 dose of Td every 10 years.  Varicella vaccine.** / Consult your health care provider.  Zoster vaccine.** / 1 dose for adults aged 29 years or older.  Pneumococcal 13-valent conjugate (PCV13) vaccine.** / Consult your health care provider.  Pneumococcal  polysaccharide (PPSV23) vaccine.** / 1 dose for all adults aged 40 years and older.  Meningococcal vaccine.** / Consult your health care provider.  Hepatitis A vaccine.** / Consult your health care provider.  Hepatitis B vaccine.** / Consult your health care provider.  Haemophilus influenzae type b (Hib) vaccine.** / Consult your health care provider. ** Family history and personal history of risk and conditions may change your health care provider's recommendations. Document Released: 10/02/2001 Document Revised: 12/21/2013 Document Reviewed: 01/01/2011 [facility] Patient Information 2015 [address], [place]. This information is not intended to replace advice given to you by your health care provider. Make sure you discuss any questions you have with your health care provider.

## 2014-09-08 LAB — CYTOLOGY - PAP

## 2014-09-10 LAB — VITAMIN D 1,25 DIHYDROXY
Vitamin D 1, 25 (OH)2 Total: 51 pg/mL (ref 18–72)
Vitamin D2 1, 25 (OH)2: <8 pg/mL
Vitamin D3 1, 25 (OH)2: 51 pg/mL

## 2014-09-16 ENCOUNTER — Other Ambulatory Visit: Payer: Self-pay | Admitting: *Deleted

## 2014-09-16 MED ORDER — FLUTICASONE PROPIONATE 50 MCG/ACT NA SUSP: 2.0000 | Freq: Every day | NASAL | Status: DC

## 2014-09-17 ENCOUNTER — Other Ambulatory Visit: Payer: Self-pay | Admitting: Gastroenterology

## 2014-09-29 ENCOUNTER — Encounter: Payer: Self-pay | Admitting: Gastroenterology

## 2014-09-29 ENCOUNTER — Telehealth: Payer: Self-pay | Admitting: Gastroenterology

## 2014-09-29 MED ORDER — DEXLANSOPRAZOLE 60 MG PO CPDR: DELAYED_RELEASE_CAPSULE | ORAL | Status: DC

## 2014-09-29 NOTE — Telephone Encounter (Signed)
Medication sent to pharmacy  Patient made a follow up appointment for 3/28

## 2014-10-06 ENCOUNTER — Other Ambulatory Visit: Payer: Self-pay | Admitting: Family Medicine

## 2014-10-06 DIAGNOSIS — Z1231 Encounter for screening mammogram for malignant neoplasm of breast: Secondary | ICD-10-CM

## 2014-11-02 ENCOUNTER — Ambulatory Visit (HOSPITAL_BASED_OUTPATIENT_CLINIC_OR_DEPARTMENT_OTHER)
Admission: RE | Admit: 2014-11-02 | Discharge: 2014-11-02 | Disposition: A | Discharge status: Home / Self Care | Payer: BC Managed Care – PPO | Source: Ambulatory Visit | Attending: Family Medicine | Admitting: Family Medicine

## 2014-11-02 DIAGNOSIS — Z1231 Encounter for screening mammogram for malignant neoplasm of breast: Secondary | ICD-10-CM | POA: Diagnosis not present

## 2014-11-04 ENCOUNTER — Other Ambulatory Visit: Payer: Self-pay | Admitting: Family Medicine

## 2014-11-10 ENCOUNTER — Other Ambulatory Visit: Payer: Self-pay | Admitting: Family Medicine

## 2014-11-10 NOTE — Telephone Encounter (Signed)
Patient requesting Xanax 0.25mg  Last refill: 05/06/14 for #30 and 1 Last Office Visit: 09/07/14 for CPE UDS 09/07/14- low risk  Contract: 09/04/13  Please advise.

## 2014-11-10 NOTE — Telephone Encounter (Signed)
Caller name: Elleigh, Cassetta Relation to pt: self  Call back number: [phone] [id number] [place], [place] [address] [phone] (Phone) [phone] (Fax         Reason for call:  Pt requesting a refill ALPRAZolam (XANAX) 0.25 MG tablet

## 2014-11-11 ENCOUNTER — Other Ambulatory Visit: Payer: Self-pay | Admitting: Orthopedic Surgery

## 2014-11-11 DIAGNOSIS — M25562 Pain in left knee: Secondary | ICD-10-CM

## 2014-11-11 MED ORDER — ALPRAZOLAM 0.25 MG PO TABS: 0.2500 mg | ORAL_TABLET | Freq: Three times a day (TID) | ORAL | Status: DC | PRN

## 2014-11-11 NOTE — Telephone Encounter (Signed)
[facility].       KP

## 2014-11-11 NOTE — Addendum Note (Signed)
Addended by: Ewing Schlein on: 11/11/2014 08:54 AM   Modules accepted: Orders

## 2014-11-14 ENCOUNTER — Ambulatory Visit
Admission: RE | Admit: 2014-11-14 | Discharge: 2014-11-14 | Disposition: A | Discharge status: Home / Self Care | Payer: BC Managed Care – PPO | Source: Ambulatory Visit | Attending: Orthopedic Surgery | Admitting: Orthopedic Surgery

## 2014-11-14 DIAGNOSIS — M25562 Pain in left knee: Secondary | ICD-10-CM

## 2014-11-15 ENCOUNTER — Encounter: Payer: Self-pay | Admitting: Gastroenterology

## 2014-11-15 ENCOUNTER — Ambulatory Visit (INDEPENDENT_AMBULATORY_CARE_PROVIDER_SITE_OTHER): Payer: BC Managed Care – PPO | Admitting: Gastroenterology

## 2014-11-15 VITALS — BP 114/74 | HR 76 | Ht 65.0 in | Wt 230.1 lb

## 2014-11-15 DIAGNOSIS — Z86010 Personal history of colonic polyps: Secondary | ICD-10-CM

## 2014-11-15 DIAGNOSIS — K219 Gastro-esophageal reflux disease without esophagitis: Secondary | ICD-10-CM | POA: Diagnosis not present

## 2014-11-15 NOTE — Progress Notes (Signed)
_                                                                                                                History of Present Illness:  Emily Osborne is a 54 -year-old Afro-American female with history of lactose intolerance, GERD here to reestablish care.  GERD symptoms are best control with dexilant.  She had failed other PPIs in the past.  With dietary indiscretion she gets abdominal bloating and some discomfort.  Colonoscopy in 2012 demonstrated a small polyp which was removed but not retrieved.  She has no lower GI complaints including change of bowel habits.  She has been treated in the past for H. pylori.   Past Medical History  Diagnosis Date  . Asthma   . Fibromyalgia   . IBS (irritable bowel syndrome)   . GERD (gastroesophageal reflux disease)   . Hyperlipidemia   . Hypertension   . Personal history of other diseases of digestive system   . Other acute reactions to stress   . Pain in joint, upper arm   . Pain in joint, shoulder region   . Elevated blood pressure reading without diagnosis of hypertension   . Edema   . Pain in limb   . Herpes zoster without mention of complication   . Nontraumatic rupture of Achilles tendon   . Ganglion of joint     right wrist   Past Surgical History  Procedure Laterality Date  . Abdominal hysterectomy    . Tubal ligation    . Bunionectomy Bilateral     Bunionectomy 1983  . Achilles tendon repair Right 2005  . Ganglion cyst excision Right     wrist  . Meniscus repair Right 04/2014   family history includes Cancer in her brother; Coronary artery disease in her brother; Diabetes in her father; Heart disease in her father; Hyperlipidemia in her mother; Hypertension in her brother, father, and mother; Kidney disease in her father; Leukemia in her brother; Lupus in her daughter; Pernicious anemia in her sister; Thyroid disease in her mother and sister. Current Outpatient Prescriptions  Medication Sig Dispense Refill  .  acetaminophen (TYLENOL) 500 MG tablet Take 500 mg by mouth every 6 (six) hours as needed.      Marland Kitchen albuterol (PROAIR HFA) 108 (90 BASE) MCG/ACT inhaler USE 2 PUFFS EVERY 4 HOURS AS NEEDED FOR COUGH, WHEEZE OR SHORTNESS OF BREATH. 8.5 each 2  . ALPRAZolam (XANAX) 0.25 MG tablet Take 1 tablet (0.25 mg total) by mouth 3 (three) times daily as needed. 30 tablet 2  . Blood Glucose Monitoring Suppl (FREESTYLE FREEDOM) KIT 1 Device by Does not apply route daily. 1 each 0  . Calcium Citrate-Vitamin D3 1000-400 LIQD 1 po qd 480 mL 5  . Cyanocobalamin (B-12) 1000 MCG SUBL 1 sl qd 90 each 3  . dexlansoprazole (DEXILANT) 60 MG capsule TAKE 1 CAPSULE BY MOUTH 30 MIN PRIOR TO BREAKFAST 30 capsule 3  . fexofenadine (ALLEGRA) 180 MG tablet Take 1 tablet (180 mg  total) by mouth daily. 90 tablet 3  . fluticasone (FLONASE) 50 MCG/ACT nasal spray Place 2 sprays into both nostrils daily. 16 g 12  . Fluticasone-Salmeterol (ADVAIR DISKUS) 250-50 MCG/DOSE AEPB Inhale 1 puff into the lungs 2 (two) times daily. 60 each 5  . glucose blood (FREESTYLE LITE) test strip Check blood sugar once daily 100 each 2  . lidocaine (LIDODERM) 5 % APPLY 1 PATCH ONTO SKIN EVERY 12 HOURS REMOVE AND DISCARD PATCH WITHIN 12 HOURS OR AS DIRECTED BY MD 30 patch 0  . NONFORMULARY OR COMPOUNDED ITEM Compression stockings 20-30 mmhg,  Edema #1 1 each 1  . Probiotic Product (PROBIOTIC PO) Take by mouth. 3 times a week    . valACYclovir (VALTREX) 1000 MG tablet TAKE 2 TABLETS BY MOUTH TWICE A DAY FOR 1 DAY AND AS DIRECTED 30 tablet 5   No current facility-administered medications for this visit.   Allergies as of 11/15/2014 - Review Complete 11/15/2014  Allergen Reaction Noted  . Gentamicin  11/04/2013  . Aspirin  09/21/2010  . Doxycycline  09/21/2010  . Ibuprofen  12/27/2009  . Influenza a (h1n1) monoval vac  09/16/2008  . Metronidazole  09/21/2010  . Nsaids  09/21/2010  . Ranitidine hcl  09/21/2010  . Sulfamethoxazole-trimethoprim   09/21/2010  . Tramadol hcl  12/27/2009    reports that she has never smoked. She has never used smokeless tobacco. She reports that she does not drink alcohol or use illicit drugs.   Review of Systems: She complains of knee pain Pertinent positive and negative review of systems were noted in the above HPI section. All other review of systems were otherwise negative.  Vital signs were reviewed in today's medical record Physical Exam: General: Well developed , well nourished, no acute distress Skin: anicteric Head: Normocephalic and atraumatic Eyes:  sclerae anicteric, EOMI Ears: Normal auditory acuity Mouth: No deformity or lesions Neck: Supple, no masses or thyromegaly Lungs: Clear throughout to auscultation Heart: Regular rate and rhythm; no murmurs, rubs or bruits Abdomen: Soft, non tender and non distended. No masses, hepatosplenomegaly or hernias noted. Normal Bowel sounds Rectal:deferred Musculoskeletal: Symmetrical with no gross deformities  Skin: No lesions on visible extremities Pulses:  Normal pulses noted Extremities: No clubbing, cyanosis, edema or deformities noted Neurological: Alert oriented x 4, grossly nonfocal Cervical Nodes:  No significant cervical adenopathy Inguinal Nodes: No significant inguinal adenopathy Psychological:  Alert and cooperative. Normal mood and affect  See Assessment and Plan under Problem List

## 2014-11-15 NOTE — Assessment & Plan Note (Signed)
Plan colonoscopy 2017

## 2014-11-15 NOTE — Patient Instructions (Signed)
Follow up as needed

## 2014-11-15 NOTE — Assessment & Plan Note (Signed)
Symptoms are well controlled with dexilant.  Plan continue with the same.  She has failed other PPIs in the past.

## 2014-12-31 ENCOUNTER — Telehealth: Payer: Self-pay | Admitting: Gastroenterology

## 2015-01-04 NOTE — Telephone Encounter (Signed)
Sent in prior authorization to [employer] waiting on response

## 2015-01-07 NOTE — Telephone Encounter (Signed)
Dexilant was approved for patient through Cover My Meds

## 2015-01-31 ENCOUNTER — Other Ambulatory Visit: Payer: Self-pay | Admitting: Gastroenterology

## 2015-04-27 ENCOUNTER — Telehealth: Payer: Self-pay | Admitting: Family Medicine

## 2015-04-27 NOTE — Telephone Encounter (Signed)
Relation to LJ:QGBE Call back number:[phone] [facility]acy: [id number] [place], [place] [phone] (Phone) [phone] (Fax)        Reason for call:  Patient requesting a refill lidocaine (LIDODERM) 5 %

## 2015-04-28 MED ORDER — LIDOCAINE 5 % EX PTCH: MEDICATED_PATCH | CUTANEOUS | Status: DC

## 2015-04-28 NOTE — Telephone Encounter (Signed)
Rx faxed.    KP 

## 2015-05-03 ENCOUNTER — Telehealth: Payer: Self-pay | Admitting: Family Medicine

## 2015-05-03 NOTE — Telephone Encounter (Signed)
°  Relation to FB:XUXY Call back number:[phone] Pharmacy:  Reason for call: pt would like to know if dr. Etter Sjogren can write her a letter for her employer stating that she is unable to take the flu shot, pt states dr. Etter Sjogren has to write her a letter every year. Pt will pick up when available

## 2015-05-04 NOTE — Telephone Encounter (Signed)
Letter written, printed and placed in Dr. Nonda Lou red folder for review and signature.

## 2015-06-20 ENCOUNTER — Other Ambulatory Visit: Payer: Self-pay | Admitting: Gastroenterology

## 2015-07-07 ENCOUNTER — Other Ambulatory Visit: Payer: Self-pay | Admitting: Family Medicine

## 2015-07-07 ENCOUNTER — Telehealth: Payer: Self-pay | Admitting: Gastroenterology

## 2015-07-07 MED ORDER — DEXLANSOPRAZOLE 60 MG PO CPDR: DELAYED_RELEASE_CAPSULE | ORAL | Status: DC

## 2015-07-07 NOTE — Telephone Encounter (Signed)
.  Was going to leave  message that med was filled (Dexilant)   Could not leave message  Mailbox was full

## 2015-07-07 NOTE — Telephone Encounter (Signed)
Last seen 09/07/14 and 11/11/14 #30 with 2 refills   Please advise      KP

## 2015-08-09 ENCOUNTER — Telehealth: Payer: Self-pay | Admitting: Family Medicine

## 2015-08-09 ENCOUNTER — Ambulatory Visit (INDEPENDENT_AMBULATORY_CARE_PROVIDER_SITE_OTHER): Payer: BC Managed Care – PPO

## 2015-08-09 DIAGNOSIS — Z111 Encounter for screening for respiratory tuberculosis: Secondary | ICD-10-CM

## 2015-08-09 NOTE — Telephone Encounter (Signed)
Pt called and scheduled for TB skin test today with nurse

## 2015-08-09 NOTE — Telephone Encounter (Signed)
Please advise if it is ok for TB skin test.    KP

## 2015-08-09 NOTE — Telephone Encounter (Signed)
That is fine 

## 2015-08-11 ENCOUNTER — Other Ambulatory Visit: Payer: Self-pay | Admitting: Family Medicine

## 2015-08-11 DIAGNOSIS — Z111 Encounter for screening for respiratory tuberculosis: Secondary | ICD-10-CM

## 2015-08-11 LAB — TB SKIN TEST
Induration: 0 mm
TB Skin Test: NEGATIVE

## 2015-08-11 NOTE — Telephone Encounter (Signed)
Last seen 09/07/14 and filled 07/07/15 #30  Please advise     KP

## 2015-09-09 ENCOUNTER — Ambulatory Visit (INDEPENDENT_AMBULATORY_CARE_PROVIDER_SITE_OTHER): Payer: BC Managed Care – PPO | Admitting: Gastroenterology

## 2015-09-09 ENCOUNTER — Encounter: Payer: Self-pay | Admitting: Gastroenterology

## 2015-09-09 VITALS — BP 110/80 | HR 80 | Ht 65.0 in | Wt 224.1 lb

## 2015-09-09 DIAGNOSIS — Z86010 Personal history of colonic polyps: Secondary | ICD-10-CM | POA: Diagnosis not present

## 2015-09-09 DIAGNOSIS — R1013 Epigastric pain: Secondary | ICD-10-CM | POA: Diagnosis not present

## 2015-09-09 DIAGNOSIS — K219 Gastro-esophageal reflux disease without esophagitis: Secondary | ICD-10-CM | POA: Diagnosis not present

## 2015-09-09 MED ORDER — DEXLANSOPRAZOLE 30 MG PO CPDR: 30.0000 mg | DELAYED_RELEASE_CAPSULE | Freq: Every day | ORAL | Status: DC

## 2015-09-09 MED ORDER — PREPOPIK 10-3.5-12 MG-GM-GM PO PACK: 1.0000 | PACK | ORAL | Status: DC

## 2015-09-09 NOTE — Patient Instructions (Signed)
You have been scheduled for a colonoscopy. Please follow written instructions given to you at your visit today.  Please pick up your prep supplies at the pharmacy within the next 1-3 days. If you use inhalers (even only as needed), please bring them with you on the day of your procedure. Your physician has requested that you go to www.startemmi.com and enter the access code given to you at your visit today. This web site gives a general overview about your procedure. However, you should still follow specific instructions given to you by our office regarding your preparation for the procedure.  We have sent the following medications to your pharmacy for you to pick up at your convenience: Dexilant 30 mg daily

## 2015-09-09 NOTE — Progress Notes (Signed)
HPI :  55 y/o female here to follow up for her GERD. Previously seen by Dr. Deatra Ina.   Her main reflux symptoms are pyrosis and chest discomfort. She is on Dexilant for her symptoms which she thinks helps her significantly. If she misses dosing for up to 2-3 days she will have some recurrence. She reports it works very well and without any significant breakthrough. On prior PPIs she has not had as much benefit. If she drinks milk and dairy she can have bloating and feels uncomfortable. She tries to use low fat or almond milk. She has tried lactaid milk in the past, but she thinks it can still cause gas. She has used lactaid enzymes which she thinks helps as well. She tries to avoid drinking milk and eating dairy in general due to symptoms and she does well in this setting. No dysphagia or odynophagia.  She has no blood in the stools. No problems with her bowel habits otherwise if she eats high fiber diet. Last colonoscopy as below. No FH of colon cancer. No weight loss.   EGD 04/2008 - normal esophagus, H pylori gastriits, normal duodenum  Last colonoscopy in 2012 with a 25m descending colon polyp removed but not retrieved.    Past Medical History  Diagnosis Date  . Asthma   . Fibromyalgia   . IBS (irritable bowel syndrome)   . GERD (gastroesophageal reflux disease)   . Hyperlipidemia   . Hypertension   . Personal history of other diseases of digestive system   . Other acute reactions to stress   . Pain in joint, upper arm   . Pain in joint, shoulder region   . Elevated blood pressure reading without diagnosis of hypertension   . Edema   . Pain in limb   . Herpes zoster without mention of complication   . Nontraumatic rupture of Achilles tendon   . Ganglion of joint     right wrist     Past Surgical History  Procedure Laterality Date  . Abdominal hysterectomy    . Tubal ligation    . Bunionectomy Bilateral     Bunionectomy 1983  . Achilles tendon repair Right 2005  .  Ganglion cyst excision Right     wrist  . Meniscus repair Right 04/2014   Family History  Problem Relation Age of Onset  . Coronary artery disease Brother   . Hyperlipidemia Mother   . Hypertension Father   . Diabetes Father   . Heart disease Father   . Kidney disease Father     Died, 510 . Leukemia Brother   . Cancer Brother     myleoblastic anemia  . Hypertension Mother     Died, 843 . Thyroid disease Mother     Thyroid surgery  . Pernicious anemia Sister   . Thyroid disease Sister     On thyroid Rx  . Lupus Daughter   . Hypertension Brother    Social History  Substance Use Topics  . Smoking status: Never Smoker   . Smokeless tobacco: Never Used  . Alcohol Use: No   Current Outpatient Prescriptions  Medication Sig Dispense Refill  . acetaminophen (TYLENOL) 500 MG tablet Take 500 mg by mouth every 6 (six) hours as needed.      .Marland Kitchenalbuterol (PROAIR HFA) 108 (90 BASE) MCG/ACT inhaler USE 2 PUFFS EVERY 4 HOURS AS NEEDED FOR COUGH, WHEEZE OR SHORTNESS OF BREATH. 8.5 each 2  . ALPRAZolam (XANAX) 0.25 MG tablet TAKE  1 TABLET BY MOUTH 3 TIMES A DAY AS NEEDED 30 tablet 0  . Blood Glucose Monitoring Suppl (FREESTYLE FREEDOM) KIT 1 Device by Does not apply route daily. 1 each 0  . Calcium Citrate-Vitamin D3 1000-400 LIQD 1 po qd 480 mL 5  . cholecalciferol (VITAMIN D) 1000 units tablet Take 2,000 Units by mouth daily.    . Cyanocobalamin (B-12) 1000 MCG SUBL 1 sl qd 90 each 3  . dexlansoprazole (DEXILANT) 60 MG capsule TAKE 1 CAPSULE BY MOUTH 30 MIN PRIOR TO BREAKFAST 30 capsule 3  . fexofenadine (ALLEGRA) 180 MG tablet Take 1 tablet (180 mg total) by mouth daily. 90 tablet 3  . fluticasone (FLONASE) 50 MCG/ACT nasal spray Place 2 sprays into both nostrils daily. 16 g 12  . Fluticasone-Salmeterol (ADVAIR DISKUS) 250-50 MCG/DOSE AEPB Inhale 1 puff into the lungs 2 (two) times daily. 60 each 5  . glucose blood (FREESTYLE LITE) test strip Check blood sugar once daily 100 each 2  .  lidocaine (LIDODERM) 5 % APPLY 1 PATCH ONTO SKIN EVERY 12 HOURS REMOVE AND DISCARD PATCH WITHIN 12 HOURS OR AS DIRECTED BY MD 30 patch 1  . NONFORMULARY OR COMPOUNDED ITEM Compression stockings 20-30 mmhg,  Edema #1 1 each 1  . Probiotic Product (PROBIOTIC PO) Take by mouth. 3 times a week    . valACYclovir (VALTREX) 1000 MG tablet TAKE 2 TABLETS BY MOUTH TWICE A DAY FOR 1 DAY AND AS DIRECTED 30 tablet 5   No current facility-administered medications for this visit.   Allergies  Allergen Reactions  . Gentamicin     Eye drops turned the sclera bright red  . Aspirin     REACTION: anaphylaxis  . Doxycycline     REACTION: severe nausea/vomiting  . Ibuprofen     REACTION: anaphylaxsis  . Influenza A (H1n1) Monoval Vac     REACTION: sick for 3 weeks  . Metronidazole     REACTION: red face/swelling  . Nsaids     REACTION: anaphylaxis  . Ranitidine Hcl     REACTION: Lips re/peel  . Sulfamethoxazole-Trimethoprim     REACTION: face red/peel  . Tramadol Hcl     REACTION: paranoid     Review of Systems: All systems reviewed and negative except where noted in HPI.   Lab Results  Component Value Date   WBC 6.0 09/07/2014   HGB 13.7 09/07/2014   HCT 41.5 09/07/2014   MCV 87.7 09/07/2014   PLT 285.0 09/07/2014    Lab Results  Component Value Date   CREATININE 0.94 09/07/2014   BUN 12 09/07/2014   NA 138 09/07/2014   K 4.0 09/07/2014   CL 104 09/07/2014   CO2 30 09/07/2014   Lab Results  Component Value Date   ALT 31 09/07/2014   AST 27 09/07/2014   ALKPHOS 74 09/07/2014   BILITOT 0.5 09/07/2014     Physical Exam: Ht _0  (1.651 m)  Wt 224 lb 2 oz (101.662 kg)  BMI 37.30 kg/m2 Constitutional: Pleasant,well-developed, female in no acute distress. HEENT: Normocephalic and atraumatic. Conjunctivae are normal. No scleral icterus. Neck supple.  Cardiovascular: Normal rate, regular rhythm.  Pulmonary/chest: Effort normal and breath sounds normal. No wheezing, rales or  rhonchi. Abdominal: Soft, protuberant, nontender. Bowel sounds active throughout. There are no masses palpable. No hepatomegaly. Extremities: no edema Lymphadenopathy: No cervical adenopathy noted. Neurological: Alert and oriented to person place and time. Skin: Skin is warm and dry. No rashes noted. Psychiatric: Normal mood  and affect. Behavior is normal.   ASSESSMENT AND PLAN:  55 y/o female with a history of GERD and colon polyps, issues as outlined:  GERD - longstanding, prior EGD showed no evidence of Barrett's. She has not done too well on other PPIs, but Dexilant has worked very well for her. The risks of long term PPIs with current data include increased risk for chronic kidney disease, increased risk of fracture, increased risk of C diff, increased risk of pneumonia (short term usage), potentially increased risk of dementia, potentially increased risk of B12 / calcium deficiency, and rare risk of hypomagnesemia. Recent studies have also shown an association with increased risk of cardiovascular outcomes including stroke. These studies have showed an association between PPIs and several of these outcomes but no evidence of causality. The patient was counseled to use the lowest daily use of PPI needed to control symptoms. Renal function is currently normal and would recommend checking this yearly while on PPI therapy. In this light will decrease Dexilant to 8m daily to see if this controls her symptoms, if not, will increase back to 656m She agreed. Otherwise she will try to avoid dairy to minimize symptoms as this often reliably reproduces dyspepsia.  History of colon polyps in 2012 - small polyp removed but not able to be collected. Given we are not certain about the pathology I would treat it as an adenoma. In this light she is due for surveillance colonoscopy at this time. The indications, risks, and benefits of colonoscopy were explained to the patient in detail. Risks include but are  not limited to bleeding, perforation, adverse reaction to medications, and cardiopulmonary compromise. Sequelae include but are not limited to the possibility of surgery, hospitalization, and mortality. The patient verbalized understanding and wished to proceed. All questions answered, referred to the scheduler and bowel prep ordered. Further recommendations pending results of the exam.    StCarolina CellarMD [facility] Pager [phone]

## 2015-09-12 ENCOUNTER — Telehealth: Payer: Self-pay | Admitting: Family Medicine

## 2015-09-12 DIAGNOSIS — J4520 Mild intermittent asthma, uncomplicated: Secondary | ICD-10-CM

## 2015-09-12 MED ORDER — ALBUTEROL SULFATE HFA 108 (90 BASE) MCG/ACT IN AERS: INHALATION_SPRAY | RESPIRATORY_TRACT | Status: DC

## 2015-09-12 MED ORDER — FLUTICASONE-SALMETEROL 250-50 MCG/DOSE IN AEPB: 1.0000 | INHALATION_SPRAY | Freq: Two times a day (BID) | RESPIRATORY_TRACT | Status: DC

## 2015-09-12 NOTE — Telephone Encounter (Signed)
Ph[facility]: [id number] [place], [place]  Reason for call: Pt needing refills on proair inhaler (uses prn) and advair inhaler. Pharmacy told her both were expired and required new RX.

## 2015-09-12 NOTE — Telephone Encounter (Signed)
Rx faxed.    KP 

## 2015-09-30 ENCOUNTER — Ambulatory Visit (AMBULATORY_SURGERY_CENTER): Payer: BC Managed Care – PPO | Admitting: Gastroenterology

## 2015-09-30 ENCOUNTER — Encounter: Payer: BC Managed Care – PPO | Admitting: Gastroenterology

## 2015-09-30 ENCOUNTER — Encounter: Payer: Self-pay | Admitting: Gastroenterology

## 2015-09-30 VITALS — BP 120/90 | HR 65 | Temp 97.2°F | Resp 16 | Ht 65.0 in | Wt 224.0 lb

## 2015-09-30 DIAGNOSIS — D128 Benign neoplasm of rectum: Secondary | ICD-10-CM

## 2015-09-30 DIAGNOSIS — K635 Polyp of colon: Secondary | ICD-10-CM | POA: Diagnosis not present

## 2015-09-30 DIAGNOSIS — D122 Benign neoplasm of ascending colon: Secondary | ICD-10-CM | POA: Diagnosis not present

## 2015-09-30 DIAGNOSIS — D125 Benign neoplasm of sigmoid colon: Secondary | ICD-10-CM

## 2015-09-30 DIAGNOSIS — D123 Benign neoplasm of transverse colon: Secondary | ICD-10-CM

## 2015-09-30 DIAGNOSIS — D120 Benign neoplasm of cecum: Secondary | ICD-10-CM | POA: Diagnosis not present

## 2015-09-30 DIAGNOSIS — Z86010 Personal history of colonic polyps: Secondary | ICD-10-CM | POA: Diagnosis not present

## 2015-09-30 MED ORDER — SODIUM CHLORIDE 0.9 % IV SOLN: 500.0000 mL | INTRAVENOUS | Status: DC

## 2015-09-30 NOTE — Patient Instructions (Signed)
NO NSAIDS FOR 2 WEEKS, October 14, 2015. , (MOTRIN, ADVIL,IBUPROFEN, ALEVE)  Handouts include: Diverticulosis, polyps.  YOU HAD AN ENDOSCOPIC PROCEDURE TODAY AT [facility]:   Refer to the procedure report that was given to you for any specific questions about what was found during the examination.  If the procedure report does not answer your questions, please call your gastroenterologist to clarify.  If you requested that your care partner not be given the details of your procedure findings, then the procedure report has been included in a sealed envelope for you to review at your convenience later.  YOU SHOULD EXPECT: Some feelings of bloating in the abdomen. Passage of more gas than usual.  Walking can help get rid of the air that was put into your GI tract during the procedure and reduce the bloating. If you had a lower endoscopy (such as a colonoscopy or flexible sigmoidoscopy) you may notice spotting of blood in your stool or on the toilet paper. If you underwent a bowel prep for your procedure, you may not have a normal bowel movement for a few days.  Please Note:  You might notice some irritation and congestion in your nose or some drainage.  This is from the oxygen used during your procedure.  There is no need for concern and it should clear up in a day or so.  SYMPTOMS TO REPORT IMMEDIATELY:   Following lower endoscopy (colonoscopy or flexible sigmoidoscopy):  Excessive amounts of blood in the stool  Significant tenderness or worsening of abdominal pains  Swelling of the abdomen that is new, acute  Fever of 100F or higher   For urgent or emergent issues, a gastroenterologist can be reached at any hour by calling [phone].   DIET: Your first meal following the procedure should be a small meal and then it is ok to progress to your normal diet. Heavy or fried foods are harder to digest and may make you feel nauseous or bloated.  Likewise, meals heavy in dairy  and vegetables can increase bloating.  Drink plenty of fluids but you should avoid alcoholic beverages for 24 hours.  ACTIVITY:  You should plan to take it easy for the rest of today and you should NOT DRIVE or use heavy machinery until tomorrow (because of the sedation medicines used during the test).    FOLLOW UP: Our staff will call the number listed on your records the next business day following your procedure to check on you and address any questions or concerns that you may have regarding the information given to you following your procedure. If we do not reach you, we will leave a message.  However, if you are feeling well and you are not experiencing any problems, there is no need to return our call.  We will assume that you have returned to your regular daily activities without incident.  If any biopsies were taken you will be contacted by phone or by letter within the next 1-3 weeks.  Please call us at [phone] if you have not heard about the biopsies in 3 weeks.    SIGNATURES/CONFIDENTIALITY: You and/or your care partner have signed paperwork which will be entered into your electronic medical record.  These signatures attest to the fact that that the information above on your After Visit Summary has been reviewed and is understood.  Full responsibility of the confidentiality of this discharge information lies with you and/or your care-partner.

## 2015-09-30 NOTE — Op Note (Signed)
[facility] [facility]  [address] [place], [id number]   COLONOSCOPY PROCEDURE REPORT  PATIENT: Osborne, Emily  MR#: [id number] BIRTHDATE: [birth year] , 19  yrs. old GENDER: female ENDOSCOPIST: Yetta Flock, MD REFERRED [id number] Low[place] MD PROCEDURE DATE:  09/30/2015 PROCEDURE:   Colonoscopy, surveillance , Colonoscopy with snare polypectomy, and Colonoscopy with biopsy First Screening Colonoscopy - Avg.  risk and is 50 yrs.  old or older - No.  Prior Negative Screening - Now for repeat screening. N/A  History of Adenoma - Now for follow-up colonoscopy & has been > or = to 3 yrs.  Yes hx of adenoma.  Has been 3 or more years since last colonoscopy.  Polyps removed today? Yes ASA CLASS:   Class II INDICATIONS:Surveillance due to prior colonic neoplasia and Colorectal Neoplasm Risk Assessment for this procedure is average risk. MEDICATIONS: Propofol 500 mg IV  DESCRIPTION OF PROCEDURE:   After the risks benefits and alternatives of the procedure were thoroughly explained, informed consent was obtained.  The digital rectal exam revealed external hemorrhoids.   The LB [id number]  endoscope was introduced through the anus and advanced to the terminal ileum which was intubated for a short distance. No adverse events experienced. The quality of the prep was adequate  The instrument was then slowly withdrawn as the colon was fully examined. Estimated blood loss is zero unless otherwise noted in this procedure report.    COLON FINDINGS: A 73mm sessile polyp was noted in the cecum and removed with cold forceps.  Two sessile polyps were noted in the ascending colon, roughly 44mm in size, both removed with cold snare. Two sessile polyps roughly 43mm in size were noted in the transverse colon and removed with cold forceps.  A 5mm sessile polyp was noted in the sigmoid colon and removed with cold snare. A 8mm sessile polyp was noted in the rectum and removed with  cold snare.  Moderate pancolonic diverticulosis was otherwise noted. The remainder of the colon was normal.  The ileum was normal. Retroflexed views revealed internal hemorrhoids. The time to cecum = 3.3 Withdrawal time = 24.9   The scope was withdrawn and the procedure completed. COMPLICATIONS: There were no immediate complications.    ENDOSCOPIC IMPRESSION: Seven small colon polyps removed as outlined above Pancolonic diverticulosis Hemorrhoids  RECOMMENDATIONS: Await pathology results Resume diet Resume medications NO NSAIDs for 2 weeks post polypectomy  eSigned:  Yetta Flock, MD 09/30/2015 4:39 PM   cc:  Garnet Koyanagi MD, the patient   PATIENT NAME:  Emily, Osborne MR#: [id number]

## 2015-09-30 NOTE — Progress Notes (Signed)
Called to room to assist during endoscopic procedure.  Patient ID and intended procedure confirmed with present staff. Received instructions for my participation in the procedure from the performing physician.  

## 2015-09-30 NOTE — Progress Notes (Signed)
To recovery, report to Tyrell, RN, VSS. 

## 2015-10-03 ENCOUNTER — Telehealth: Payer: Self-pay

## 2015-10-03 NOTE — Telephone Encounter (Signed)
  Follow up Call-  Call back number 09/30/2015  Post procedure Call Back phone  # [phone]     Patient questions:  Do you have a fever, pain , or abdominal swelling? No. Pain Score  0 *  Have you tolerated food without any problems? Yes.    Have you been able to return to your normal activities? Yes.    Do you have any questions about your discharge instructions: Diet   No. Medications  No. Follow up visit  No.  Do you have questions or concerns about your Care? No.  Actions: * If pain score is 4 or above: No action needed, pain <4.

## 2015-10-04 ENCOUNTER — Ambulatory Visit (INDEPENDENT_AMBULATORY_CARE_PROVIDER_SITE_OTHER): Payer: BC Managed Care – PPO | Admitting: Physician Assistant

## 2015-10-04 ENCOUNTER — Encounter: Payer: Self-pay | Admitting: Physician Assistant

## 2015-10-04 VITALS — BP 127/87 | HR 72 | Temp 98.1°F | Ht 65.0 in | Wt 223.8 lb

## 2015-10-04 DIAGNOSIS — J019 Acute sinusitis, unspecified: Secondary | ICD-10-CM

## 2015-10-04 DIAGNOSIS — B9689 Other specified bacterial agents as the cause of diseases classified elsewhere: Secondary | ICD-10-CM | POA: Insufficient documentation

## 2015-10-04 MED ORDER — KETOTIFEN FUMARATE 0.025 % OP SOLN: 1.0000 [drp] | Freq: Two times a day (BID) | OPHTHALMIC | Status: DC

## 2015-10-04 MED ORDER — AMOXICILLIN-POT CLAVULANATE 875-125 MG PO TABS: 1.0000 | ORAL_TABLET | Freq: Two times a day (BID) | ORAL | Status: DC

## 2015-10-04 NOTE — Patient Instructions (Signed)
Please take antibiotic as directed.  Increase fluid intake.  Use Saline nasal spray.  Take a daily multivitamin. Use the allergy eye drops as directed.  Do not place contacts in the eyes until symptoms have resolved.  Place a humidifier in the bedroom.  Please call or return clinic if symptoms are not improving.  Sinusitis Sinusitis is redness, soreness, and swelling (inflammation) of the paranasal sinuses. Paranasal sinuses are air pockets within the bones of your face (beneath the eyes, the middle of the forehead, or above the eyes). In healthy paranasal sinuses, mucus is able to drain out, and air is able to circulate through them by way of your nose. However, when your paranasal sinuses are inflamed, mucus and air can become trapped. This can allow bacteria and other germs to grow and cause infection. Sinusitis can develop quickly and last only a short time (acute) or continue over a long period (chronic). Sinusitis that lasts for more than 12 weeks is considered chronic.  CAUSES  Causes of sinusitis include:  Allergies.  Structural abnormalities, such as displacement of the cartilage that separates your nostrils (deviated septum), which can decrease the air flow through your nose and sinuses and affect sinus drainage.  Functional abnormalities, such as when the small hairs (cilia) that line your sinuses and help remove mucus do not work properly or are not present. SYMPTOMS  Symptoms of acute and chronic sinusitis are the same. The primary symptoms are pain and pressure around the affected sinuses. Other symptoms include:  Upper toothache.  Earache.  Headache.  Bad breath.  Decreased sense of smell and taste.  A cough, which worsens when you are lying flat.  Fatigue.  Fever.  Thick drainage from your nose, which often is green and may contain pus (purulent).  Swelling and warmth over the affected sinuses. DIAGNOSIS  Your caregiver will perform a physical exam. During the  exam, your caregiver may:  Look in your nose for signs of abnormal growths in your nostrils (nasal polyps).  Tap over the affected sinus to check for signs of infection.  View the inside of your sinuses (endoscopy) with a special imaging device with a light attached (endoscope), which is inserted into your sinuses. If your caregiver suspects that you have chronic sinusitis, one or more of the following tests may be recommended:  Allergy tests.  Nasal culture A sample of mucus is taken from your nose and sent to a lab and screened for bacteria.  Nasal cytology A sample of mucus is taken from your nose and examined by your caregiver to determine if your sinusitis is related to an allergy. TREATMENT  Most cases of acute sinusitis are related to a viral infection and will resolve on their own within 10 days. Sometimes medicines are prescribed to help relieve symptoms (pain medicine, decongestants, nasal steroid sprays, or saline sprays).  However, for sinusitis related to a bacterial infection, your caregiver will prescribe antibiotic medicines. These are medicines that will help kill the bacteria causing the infection.  Rarely, sinusitis is caused by a fungal infection. In theses cases, your caregiver will prescribe antifungal medicine. For some cases of chronic sinusitis, surgery is needed. Generally, these are cases in which sinusitis recurs more than 3 times per year, despite other treatments. HOME CARE INSTRUCTIONS   Drink plenty of water. Water helps thin the mucus so your sinuses can drain more easily.  Use a humidifier.  Inhale steam 3 to 4 times a day (for example, sit in the bathroom  with the shower running).  Apply a warm, moist washcloth to your face 3 to 4 times a day, or as directed by your caregiver.  Use saline nasal sprays to help moisten and clean your sinuses.  Take over-the-counter or prescription medicines for pain, discomfort, or fever only as directed by your  caregiver. SEEK IMMEDIATE MEDICAL CARE IF:  You have increasing pain or severe headaches.  You have nausea, vomiting, or drowsiness.  You have swelling around your face.  You have vision problems.  You have a stiff neck.  You have difficulty breathing. MAKE SURE YOU:   Understand these instructions.  Will watch your condition.  Will get help right away if you are not doing well or get worse. Document Released: 08/06/2005 Document Revised: 10/29/2011 Document Reviewed: 08/21/2011 [facility] [employer] Patient Information 2014 [place], [place].

## 2015-10-04 NOTE — Progress Notes (Signed)
Pre visit review using our clinic review tool, if applicable. No additional management support is needed unless otherwise documented below in the visit note. 

## 2015-10-04 NOTE — Assessment & Plan Note (Signed)
Rx Augmentin.  Increase fluids.  Rest.  Saline nasal spray.  Probiotic.  Mucinex as directed.  Humidifier in bedroom. Rx Alaway eye drops due to irritation of eyes without true conjunctivitis. Supportive measures reviewed.  Call or return to clinic if symptoms are not improving.

## 2015-10-04 NOTE — Progress Notes (Signed)
Patient presents to clinic today c/o 4 days of rhinorrhea, sinus pressure, sinus pain and fatigue. Endorses mild dizziness and ear pressure. Endorses 1.5 days of bilateral eye irritation with crusting and itching. Denies fever, chest congestion or SOB. Is a contact wearer but has taken them out. Has history of allergies -- is taking Flonase and Allegra daily as directed.  Past Medical History  Diagnosis Date  . Asthma   . Fibromyalgia   . IBS (irritable bowel syndrome)   . GERD (gastroesophageal reflux disease)   . Hyperlipidemia   . Hypertension   . Personal history of other diseases of digestive system   . Other acute reactions to stress   . Pain in joint, upper arm   . Pain in joint, shoulder region   . Elevated blood pressure reading without diagnosis of hypertension   . Edema   . Pain in limb   . Herpes zoster without mention of complication   . Nontraumatic rupture of Achilles tendon   . Ganglion of joint     right wrist    Current Outpatient Prescriptions on File Prior to Visit  Medication Sig Dispense Refill  . acetaminophen (TYLENOL) 500 MG tablet Take 500 mg by mouth every 6 (six) hours as needed.      Marland Kitchen albuterol (PROAIR HFA) 108 (90 Base) MCG/ACT inhaler USE 2 PUFFS EVERY 4 HOURS AS NEEDED FOR COUGH, WHEEZE OR SHORTNESS OF BREATH. 8.5 each 2  . ALPRAZolam (XANAX) 0.25 MG tablet TAKE 1 TABLET BY MOUTH 3 TIMES A DAY AS NEEDED 30 tablet 0  . Blood Glucose Monitoring Suppl (FREESTYLE FREEDOM) KIT 1 Device by Does not apply route daily. 1 each 0  . Calcium Citrate-Vitamin D3 1000-400 LIQD 1 po qd 480 mL 5  . cholecalciferol (VITAMIN D) 1000 units tablet Take 2,000 Units by mouth daily.    . Cyanocobalamin (B-12) 1000 MCG SUBL 1 sl qd 90 each 3  . Dexlansoprazole 30 MG capsule Take 1 capsule (30 mg total) by mouth daily. 90 capsule 3  . fexofenadine (ALLEGRA) 180 MG tablet Take 1 tablet (180 mg total) by mouth daily. 90 tablet 3  . fluticasone (FLONASE) 50 MCG/ACT nasal  spray Place 2 sprays into both nostrils daily. 16 g 12  . Fluticasone-Salmeterol (ADVAIR DISKUS) 250-50 MCG/DOSE AEPB Inhale 1 puff into the lungs 2 (two) times daily. 60 each 2  . glucose blood (FREESTYLE LITE) test strip Check blood sugar once daily 100 each 2  . lidocaine (LIDODERM) 5 % APPLY 1 PATCH ONTO SKIN EVERY 12 HOURS REMOVE AND DISCARD PATCH WITHIN 12 HOURS OR AS DIRECTED BY MD 30 patch 1  . NONFORMULARY OR COMPOUNDED ITEM Compression stockings 20-30 mmhg,  Edema #1 1 each 1  . valACYclovir (VALTREX) 1000 MG tablet TAKE 2 TABLETS BY MOUTH TWICE A DAY FOR 1 DAY AND AS DIRECTED 30 tablet 5   No current facility-administered medications on file prior to visit.    Allergies  Allergen Reactions  . Gentamicin     Eye drops turned the sclera bright red  . Aspirin     REACTION: anaphylaxis  . Doxycycline     REACTION: severe nausea/vomiting  . Ibuprofen     REACTION: anaphylaxsis  . Influenza A (H1n1) Monoval Vac     REACTION: sick for 3 weeks  . Metronidazole     REACTION: red face/swelling  . Nsaids     REACTION: anaphylaxis  . Ranitidine Hcl     REACTION: Lips  re/peel  . Sulfamethoxazole-Trimethoprim     REACTION: face red/peel  . Tramadol Hcl     REACTION: paranoid    Family History  Problem Relation Age of Onset  . Coronary artery disease Brother   . Hyperlipidemia Mother   . Hypertension Father   . Diabetes Father   . Heart disease Father   . Kidney disease Father     Died, 71  . Leukemia Brother   . Cancer Brother     myleoblastic anemia  . Hypertension Mother     Died, 71  . Thyroid disease Mother     Thyroid surgery  . Pernicious anemia Sister   . Thyroid disease Sister     On thyroid Rx  . Lupus Daughter   . Hypertension Brother     Social History   Social History  . Marital Status: Divorced    Spouse Name: N/A  . Number of Children: 2  . Years of Education: N/A   Occupational History  . Nurse/teacher    Social History Main Topics  .  Smoking status: Never Smoker   . Smokeless tobacco: Never Used  . Alcohol Use: No  . Drug Use: No  . Sexual Activity:    Partners: Male   Other Topics Concern  . None   Social History Narrative   Lives with alone.  She has two daughter.   She works as a Programme researcher, broadcasting/film/video at [employer]--- 3x a week , gym-----20-30 min   Review of Systems - See HPI.  All other ROS are negative.  BP 127/87 mmHg  Pulse 72  Temp(Src) 98.1 F (36.7 C) (Oral)  Ht 5' 5"  (1.651 m)  Wt 223 lb 12.8 oz (101.515 kg)  BMI 37.24 kg/m2  SpO2 97%  Physical Exam  Constitutional: She is oriented to person, place, and time and well-developed, well-nourished, and in no distress.  HENT:  Head: Normocephalic and atraumatic.  Right Ear: Tympanic membrane normal.  Left Ear: Tympanic membrane normal.  Nose: Mucosal edema and rhinorrhea present. Right sinus exhibits frontal sinus tenderness. Left sinus exhibits frontal sinus tenderness.  Mouth/Throat: Uvula is midline, oropharynx is clear and moist and mucous membranes are normal.  Eyes: Conjunctivae are normal.  Neck: Neck supple.  Cardiovascular: Normal rate, regular rhythm, normal heart sounds and intact distal pulses.   Pulmonary/Chest: Effort normal and breath sounds normal. No respiratory distress. She has no wheezes. She has no rales. She exhibits no tenderness.  Neurological: She is alert and oriented to person, place, and time.  Skin: Skin is warm and dry. No rash noted.  Psychiatric: Affect normal.  Vitals reviewed.   Recent Results (from the past 2160 hour(s))  TB Skin Test     Status: Normal   Collection Time: 08/11/15  3:02 PM  Result Value Ref Range   TB Skin Test Negative    Induration 0 mm    Assessment/Plan: Acute bacterial sinusitis Rx Augmentin.  Increase fluids.  Rest.  Saline nasal spray.  Probiotic.  Mucinex as directed.  Humidifier in bedroom. Rx Alaway eye drops due to irritation of eyes without true conjunctivitis. Supportive  measures reviewed.  Call or return to clinic if symptoms are not improving.

## 2015-10-07 ENCOUNTER — Encounter: Payer: Self-pay | Admitting: Gastroenterology

## 2015-10-17 ENCOUNTER — Encounter: Payer: Self-pay | Admitting: Family Medicine

## 2015-10-17 ENCOUNTER — Telehealth: Payer: Self-pay | Admitting: Family Medicine

## 2015-10-17 NOTE — Telephone Encounter (Signed)
Pt called in to speak with PCP or CMA directly. Pt says that she has some allergy concerns. Pt says that she was just seen by PCP.   Pt CB to discuss further.   CB: [phone]

## 2015-10-17 NOTE — Telephone Encounter (Signed)
Mychart message sent to provider.     KP

## 2015-10-17 NOTE — Telephone Encounter (Signed)
Ov today please 

## 2015-10-17 NOTE — Telephone Encounter (Signed)
Patient scheduled for tomorrow at 59.   KP

## 2015-10-18 ENCOUNTER — Ambulatory Visit: Payer: BC Managed Care – PPO | Admitting: Family Medicine

## 2015-10-18 ENCOUNTER — Telehealth: Payer: Self-pay | Admitting: Family Medicine

## 2015-10-18 NOTE — Telephone Encounter (Signed)
No charge. 

## 2015-10-18 NOTE — Telephone Encounter (Signed)
Pt lvm at 9:17 stating that she will not be able to make appt today.

## 2015-10-31 ENCOUNTER — Telehealth: Payer: Self-pay | Admitting: Family Medicine

## 2015-10-31 ENCOUNTER — Ambulatory Visit (INDEPENDENT_AMBULATORY_CARE_PROVIDER_SITE_OTHER): Payer: BC Managed Care – PPO | Admitting: Family Medicine

## 2015-10-31 ENCOUNTER — Encounter: Payer: Self-pay | Admitting: Family Medicine

## 2015-10-31 VITALS — BP 116/82 | HR 77 | Temp 97.9°F | Ht 65.0 in | Wt 223.8 lb

## 2015-10-31 DIAGNOSIS — R7989 Other specified abnormal findings of blood chemistry: Secondary | ICD-10-CM

## 2015-10-31 DIAGNOSIS — M6289 Other specified disorders of muscle: Secondary | ICD-10-CM | POA: Diagnosis not present

## 2015-10-31 DIAGNOSIS — Z136 Encounter for screening for cardiovascular disorders: Secondary | ICD-10-CM

## 2015-10-31 DIAGNOSIS — R29898 Other symptoms and signs involving the musculoskeletal system: Secondary | ICD-10-CM

## 2015-10-31 DIAGNOSIS — R42 Dizziness and giddiness: Secondary | ICD-10-CM

## 2015-10-31 DIAGNOSIS — R946 Abnormal results of thyroid function studies: Secondary | ICD-10-CM

## 2015-10-31 LAB — COMPREHENSIVE METABOLIC PANEL
ALT: 37 U/L — AB (ref 0–35)
AST: 49 U/L — AB (ref 0–37)
Albumin: 4.3 g/dL (ref 3.5–5.2)
Alkaline Phosphatase: 58 U/L (ref 39–117)
BILIRUBIN TOTAL: 0.5 mg/dL (ref 0.2–1.2)
BUN: 11 mg/dL (ref 6–23)
CO2: 29 meq/L (ref 19–32)
CREATININE: 0.82 mg/dL (ref 0.40–1.20)
Calcium: 9.8 mg/dL (ref 8.4–10.5)
Chloride: 105 mEq/L (ref 96–112)
GFR: 92.82 mL/min (ref >60.00)
GLUCOSE: 88 mg/dL (ref 70–99)
Potassium: 3.8 mEq/L (ref 3.5–5.1)
Sodium: 142 mEq/L (ref 135–145)
Total Protein: 7.4 g/dL (ref 6.0–8.3)

## 2015-10-31 LAB — CBC
HCT: 40.4 % (ref 36.0–46.0)
Hemoglobin: 13.3 g/dL (ref 12.0–15.0)
MCHC: 33 g/dL (ref 30.0–36.0)
MCV: 87 fl (ref 78.0–100.0)
Platelets: 259 10*3/uL (ref 150.0–400.0)
RBC: 4.64 Mil/uL (ref 3.87–5.11)
RDW: 14.9 % (ref 11.5–15.5)
WBC: 5.7 10*3/uL (ref 4.0–10.5)

## 2015-10-31 LAB — TSH: TSH: 0.11 u[IU]/mL — AB (ref 0.35–4.50)

## 2015-10-31 NOTE — Progress Notes (Addendum)
[place] at [facility] [address], [place], [place] [address] [phone]  Date:  10/31/2015   Name:  Emily Osborne   DOB:  February 03, 1961   MRN:  [id number]  PCP:  Garnet Koyanagi, DO    Chief Complaint: Dizziness   History of Present Illness:  Emily Osborne is a 55 y.o. very pleasant female patient who presents with the following:  She is here today with dizziness.  She noted it just this morning. She had been out of bed for a few minutes and went to the bathroom- she stood up from the toilet and lost her balance. She did not fall but staggered a bit.  Since then the sx have come and gone  She does not have vertigo- she feels lightheaded.  She has had this sort of thing in the past and it self resolved She has noted some nasal and ear congestion recently.  Wonders if her sx are due to allergies  No fever.  She has noted a HA but it has not been present for the last 2 days She did eat this am- hot dogs and cheese toast.   No vomiting No SOB or chest pain She does have GERD- yesterday she did notice some burning but she took some rolaids and it resolved  Negative ETT in 2015  Lab Results  Component Value Date   HGBA1C 6.1 03/06/2011   History of pre-diabetes  She has noted a habit of increased dropping things for the last 3- 4 weeks. This will occur with either hand.  She had not thought to mention it. She has not noted any other neurological sx such as other weakness, numbness or slurred speech  She did see neurology a few years ago for other non- specific sx and her eval was negative  Patient Active Problem List   Diagnosis Date Noted  . Acute bacterial sinusitis 10/04/2015  . History of colonic polyps 11/15/2014  . Left shoulder pain 09/28/2013  . Left-sided weakness 09/16/2013  . Obesity (BMI 30-39.9) 09/02/2013  . Myalgia 06/21/2011  . POSTMENOPAUSAL STATUS 09/07/2010  . PAIN IN JOINT, MULTIPLE SITES 12/27/2009   . LOW BACK PAIN, CHRONIC 12/27/2009  . FATIGUE 12/27/2009  . VAGINITIS 11/22/2009  . DYSPEPSIA&OTHER [facility] DISORDERS FUNCTION STOMACH 09/01/2009  . NAUSEA 08/03/2009  . FLATULENCE-GAS-BLOATING 08/03/2009  . ABDOMINAL PAIN RIGHT UPPER QUADRANT 07/06/2009  . SUPRAPUBIC PAIN 07/06/2009  . GANGLION CYST, HX OF 07/06/2009  . BACK PAIN 05/04/2009  . HYPERLIPIDEMIA 09/16/2008  . UNSPECIFIED MYALGIA AND MYOSITIS 09/16/2008  . CHEST PAIN UNSPECIFIED 07/27/2008  . GERD 06/08/2008  . HYPERGLYCEMIA, FASTING 06/08/2008  . HELICOBACTER PYLORI GASTRITIS, HX OF 04/05/2008  . OTHER ACUTE REACTIONS TO STRESS 03/16/2008  . ABDOMINAL PAIN, EPIGASTRIC 03/16/2008  . SHOULDER PAIN, RIGHT 10/01/2007  . ELBOW PAIN, RIGHT 10/01/2007  . ELEVATED BLOOD PRESSURE 04/29/2007  . Pain in limb 04/14/2007  . DEPENDENT EDEMA, RIGHT LEG 04/14/2007  . SHINGLES 04/08/2007  . ASTHMA 03/18/2007  . GANGLION CYST, WRIST, RIGHT 03/18/2007  . Nontraumatic rupture of Achilles tendon 03/18/2007  . BUNIONECTOMY, HX OF 03/18/2007    Past Medical History  Diagnosis Date  . Asthma   . Fibromyalgia   . IBS (irritable bowel syndrome)   . GERD (gastroesophageal reflux disease)   . Hyperlipidemia   . Hypertension   . Personal history of other diseases of digestive system   . Other acute reactions to stress   .  Pain in joint, upper arm   . Pain in joint, shoulder region   . Elevated blood pressure reading without diagnosis of hypertension   . Edema   . Pain in limb   . Herpes zoster without mention of complication   . Nontraumatic rupture of Achilles tendon   . Ganglion of joint     right wrist    Past Surgical History  Procedure Laterality Date  . Abdominal hysterectomy    . Tubal ligation    . Bunionectomy Bilateral     Bunionectomy 1983  . Achilles tendon repair Right 2005  . Ganglion cyst excision Right     wrist  . Meniscus repair Right 04/2014    Social History  Substance Use Topics  . Smoking status:  Never Smoker   . Smokeless tobacco: Never Used  . Alcohol Use: No    Family History  Problem Relation Age of Onset  . Coronary artery disease Brother   . Hyperlipidemia Mother   . Hypertension Father   . Diabetes Father   . Heart disease Father   . Kidney disease Father     Died, 71  . Leukemia Brother   . Cancer Brother     myleoblastic anemia  . Hypertension Mother     Died, 46  . Thyroid disease Mother     Thyroid surgery  . Pernicious anemia Sister   . Thyroid disease Sister     On thyroid Rx  . Lupus Daughter   . Hypertension Brother     Allergies  Allergen Reactions  . Gentamicin     Eye drops turned the sclera bright red  . Aspirin     REACTION: anaphylaxis  . Doxycycline     REACTION: severe nausea/vomiting  . Ibuprofen     REACTION: anaphylaxsis  . Influenza A (H1n1) Monoval Vac     REACTION: sick for 3 weeks  . Metronidazole     REACTION: red face/swelling  . Nsaids     REACTION: anaphylaxis  . Ranitidine Hcl     REACTION: Lips re/peel  . Sulfamethoxazole-Trimethoprim     REACTION: face red/peel  . Tramadol Hcl     REACTION: paranoid    Medication list has been reviewed and updated.  Current Outpatient Prescriptions on File Prior to Visit  Medication Sig Dispense Refill  . acetaminophen (TYLENOL) 500 MG tablet Take 500 mg by mouth every 6 (six) hours as needed.      Marland Kitchen albuterol (PROAIR HFA) 108 (90 Base) MCG/ACT inhaler USE 2 PUFFS EVERY 4 HOURS AS NEEDED FOR COUGH, WHEEZE OR SHORTNESS OF BREATH. 8.5 each 2  . ALPRAZolam (XANAX) 0.25 MG tablet TAKE 1 TABLET BY MOUTH 3 TIMES A DAY AS NEEDED 30 tablet 0  . Blood Glucose Monitoring Suppl (FREESTYLE FREEDOM) KIT 1 Device by Does not apply route daily. 1 each 0  . Calcium Citrate-Vitamin D3 1000-400 LIQD 1 po qd 480 mL 5  . cholecalciferol (VITAMIN D) 1000 units tablet Take 2,000 Units by mouth daily.    . Cyanocobalamin (B-12) 1000 MCG SUBL 1 sl qd 90 each 3  . Dexlansoprazole 30 MG capsule Take 1  capsule (30 mg total) by mouth daily. 90 capsule 3  . fexofenadine (ALLEGRA) 180 MG tablet Take 1 tablet (180 mg total) by mouth daily. 90 tablet 3  . fluticasone (FLONASE) 50 MCG/ACT nasal spray Place 2 sprays into both nostrils daily. 16 g 12  . Fluticasone-Salmeterol (ADVAIR DISKUS) 250-50 MCG/DOSE AEPB Inhale 1 puff  into the lungs 2 (two) times daily. 60 each 2  . glucose blood (FREESTYLE LITE) test strip Check blood sugar once daily 100 each 2  . lidocaine (LIDODERM) 5 % APPLY 1 PATCH ONTO SKIN EVERY 12 HOURS REMOVE AND DISCARD PATCH WITHIN 12 HOURS OR AS DIRECTED BY MD 30 patch 1  . NONFORMULARY OR COMPOUNDED ITEM Compression stockings 20-30 mmhg,  Edema #1 1 each 1  . valACYclovir (VALTREX) 1000 MG tablet TAKE 2 TABLETS BY MOUTH TWICE A DAY FOR 1 DAY AND AS DIRECTED 30 tablet 5   No current facility-administered medications on file prior to visit.    Review of Systems:  As per HPI- otherwise negative.   Physical Examination: Filed Vitals:   10/31/15 1118  BP: 116/82  Pulse: 77  Temp: 97.9 F (36.6 C)   Filed Vitals:   10/31/15 1118  Height: 5' 5"  (1.651 m)  Weight: 223 lb 12.8 oz (101.515 kg)   Body mass index is 37.24 kg/(m^2). Ideal Body Weight: Weight in (lb) to have BMI = 25: 149.9  GEN: WDWN, NAD, Non-toxic, A & O x 3, obese, looks well HEENT: Atraumatic, Normocephalic. Neck supple. No masses, No LAD.  Bilateral TM wnl, oropharynx normal.  PEERL,EOMI.   Ears and Nose: No external deformity. CV: RRR, No M/G/R. No JVD. No thrill. No extra heart sounds. PULM: CTA B, no wheezes, crackles, rhonchi. No retractions. No resp. distress. No accessory muscle use. ABD: S, NT, ND. No rebound. No HSM. EXTR: No c/c/e NEURO Normal gait.  Normal strength, DTR and sensation of all extremities. Normal facial movement and sensation.  Negative Osborne PSYCH: Normally interactive. Conversant. Not depressed or anxious appearing.  Calm demeanor.   EKG:  NSR, no change from prior  EKG  Orthostatic VS for the past 24 hrs:  BP- Lying Pulse- Lying BP- Standing at 0 minutes Pulse- Standing at 0 minutes  10/31/15 1228 124/82 mmHg 60 121/76 mmHg 78    Assessment and Plan: Dizziness and giddiness - Plan: EKG 12-Lead, Ambulatory referral to Neurology, CBC, Comprehensive metabolic panel, TSH  Screening for ischemic heart disease - Plan: EKG 12-Lead  Hand weakness - Plan: Ambulatory referral to Neurology here today due to feeling lightheaded. Advised her that I cannot pinpoint a cause so far and offered to have her seen in the ER for further evaluation. She declines at this time but will keep a close watch on her sx.   Will check basic labs today, will refer to neurology to discuss increasing dropping of objects.   Her pulse did go up by nearly 20 BPM so she may be a bit orthostatic, suggested that she drink some gatorade and eat some salty snacks today Seek care if any worsening or change in her sx   Signed Lamar Blinks, MD  Received the rest of her labs- TSH is low but T3/4 normal.  Called and [facility] 3/17- I am glad that all looked ok at the ER.  I am going to refer her to endocrinology as her TSH has been slightly suppressed on a couple of occasions over the last 1-2 years.  Please let me know if any questions

## 2015-10-31 NOTE — Telephone Encounter (Signed)
Appt noted.  Message routed to Dr. Lorelei Pont for [place].

## 2015-10-31 NOTE — Patient Instructions (Signed)
We will check labs for your today and I will be in touch asap Eat some pretzels/ gatorade Rest today and let me know if you are not feeling better!  I will refer you to neurology regarding your new habit of dropping items

## 2015-10-31 NOTE — Telephone Encounter (Signed)
Patient Name: KARENDA DORE  DOB: [birth year]    Initial Comment caller states she is dizzy, staggering, weakness and feels faint; almost fell twice; BS is 106    Nurse Assessment  Nurse: Leilani Merl, RN, Heather Date/Time (Eastern Time): 10/31/2015 9:52:46 AM  Confirm and document reason for call. If symptomatic, describe symptoms. You must click the next button to save text entered. ---caller states she is dizzy, staggering, weakness and feels faint; almost fell twice; BS is 106. Caller states that it started after she got up this morning.  Has the patient traveled out of the country within the last 30 days? ---Not Applicable  Does the patient have any new or worsening symptoms? ---Yes  Will a triage be completed? ---Yes  Related visit to physician within the last 2 weeks? ---No  Does the PT have any chronic conditions? (i.e. diabetes, asthma, etc.) ---Yes  List chronic conditions. ---See MR  Is this a behavioral health or substance abuse call? ---No     Guidelines    Guideline Title Affirmed Question Affirmed Notes  Dizziness - Lightheadedness SEVERE dizziness (e.g., unable to stand, requires support to walk, feels like passing out now)    Final Disposition User   Go to ED Now (or PCP triage) Leilani Merl, RN, Heather    Comments  Made appt with Dr. Edilia Bo today at 11:15 am.   Referrals  REFERRED TO PCP OFFICE   Disagree/Comply: Comply

## 2015-10-31 NOTE — Telephone Encounter (Signed)
Pt called in dizzy, staggering, and feels like she is going to pass out. Transferred to [facility] with [facility].

## 2015-11-02 ENCOUNTER — Emergency Department (HOSPITAL_COMMUNITY): Payer: BC Managed Care – PPO

## 2015-11-02 ENCOUNTER — Other Ambulatory Visit (INDEPENDENT_AMBULATORY_CARE_PROVIDER_SITE_OTHER): Payer: BC Managed Care – PPO

## 2015-11-02 ENCOUNTER — Emergency Department (HOSPITAL_BASED_OUTPATIENT_CLINIC_OR_DEPARTMENT_OTHER)
Admission: EM | Admit: 2015-11-02 | Discharge: 2015-11-02 | Disposition: A | Discharge status: Home / Self Care | Payer: BC Managed Care – PPO | Attending: Emergency Medicine | Admitting: Emergency Medicine

## 2015-11-02 ENCOUNTER — Encounter (HOSPITAL_BASED_OUTPATIENT_CLINIC_OR_DEPARTMENT_OTHER): Payer: Self-pay | Admitting: *Deleted

## 2015-11-02 ENCOUNTER — Telehealth: Payer: Self-pay | Admitting: Family Medicine

## 2015-11-02 ENCOUNTER — Emergency Department (HOSPITAL_BASED_OUTPATIENT_CLINIC_OR_DEPARTMENT_OTHER): Payer: BC Managed Care – PPO

## 2015-11-02 DIAGNOSIS — I10 Essential (primary) hypertension: Secondary | ICD-10-CM | POA: Insufficient documentation

## 2015-11-02 DIAGNOSIS — R946 Abnormal results of thyroid function studies: Secondary | ICD-10-CM | POA: Diagnosis not present

## 2015-11-02 DIAGNOSIS — R200 Anesthesia of skin: Secondary | ICD-10-CM | POA: Insufficient documentation

## 2015-11-02 DIAGNOSIS — R42 Dizziness and giddiness: Secondary | ICD-10-CM | POA: Insufficient documentation

## 2015-11-02 DIAGNOSIS — R7989 Other specified abnormal findings of blood chemistry: Secondary | ICD-10-CM

## 2015-11-02 LAB — URINALYSIS, ROUTINE W REFLEX MICROSCOPIC
BILIRUBIN URINE: NEGATIVE
Glucose, UA: NEGATIVE mg/dL
HGB URINE DIPSTICK: NEGATIVE
KETONES UR: NEGATIVE mg/dL
Leukocytes, UA: NEGATIVE
Nitrite: NEGATIVE
PROTEIN: NEGATIVE mg/dL
Specific Gravity, Urine: 1.011 (ref 1.005–1.030)
pH: 7 (ref 5.0–8.0)

## 2015-11-02 LAB — CBC WITH DIFFERENTIAL/PLATELET
BASOS ABS: 0 10*3/uL (ref 0.0–0.1)
Basophils Relative: 0 %
Eosinophils Absolute: 0.2 10*3/uL (ref 0.0–0.7)
Eosinophils Relative: 3 %
HEMATOCRIT: 39.2 % (ref 36.0–46.0)
Hemoglobin: 13.2 g/dL (ref 12.0–15.0)
LYMPHS PCT: 47 %
Lymphs Abs: 2.4 10*3/uL (ref 0.7–4.0)
MCH: 28.8 pg (ref 26.0–34.0)
MCHC: 33.7 g/dL (ref 30.0–36.0)
MCV: 85.6 fL (ref 78.0–100.0)
MONO ABS: 0.7 10*3/uL (ref 0.1–1.0)
MONOS PCT: 13 %
NEUTROS ABS: 1.9 10*3/uL (ref 1.7–7.7)
Neutrophils Relative %: 37 %
Platelets: 265 10*3/uL (ref 150–400)
RBC: 4.58 MIL/uL (ref 3.87–5.11)
RDW: 14.1 % (ref 11.5–15.5)
WBC: 5.1 10*3/uL (ref 4.0–10.5)

## 2015-11-02 LAB — T4, FREE: FREE T4: 1.18 ng/dL (ref 0.60–1.60)

## 2015-11-02 LAB — COMPREHENSIVE METABOLIC PANEL
ALK PHOS: 62 U/L (ref 38–126)
ALT: 37 U/L (ref 14–54)
AST: 39 U/L (ref 15–41)
Albumin: 4 g/dL (ref 3.5–5.0)
Anion gap: 6 (ref 5–15)
BILIRUBIN TOTAL: 0.5 mg/dL (ref 0.3–1.2)
BUN: 14 mg/dL (ref 6–20)
CALCIUM: 9.2 mg/dL (ref 8.9–10.3)
CO2: 27 mmol/L (ref 22–32)
Chloride: 107 mmol/L (ref 101–111)
Creatinine, Ser: 0.72 mg/dL (ref 0.44–1.00)
GFR calc Af Amer: >60 mL/min (ref >60)
Glucose, Bld: 86 mg/dL (ref 65–99)
POTASSIUM: 4.3 mmol/L (ref 3.5–5.1)
Sodium: 140 mmol/L (ref 135–145)
TOTAL PROTEIN: 7.3 g/dL (ref 6.5–8.1)

## 2015-11-02 LAB — T3, FREE: T3, Free: 3.7 pg/mL (ref 2.3–4.2)

## 2015-11-02 MED ORDER — MECLIZINE HCL 25 MG PO TABS
25.0000 mg | ORAL_TABLET | Freq: Once | ORAL | Status: AC
  Administered 2015-11-02: 25 mg via ORAL
  Filled 2015-11-02: qty 1

## 2015-11-02 MED ORDER — MECLIZINE HCL 32 MG PO TABS: 32.0000 mg | ORAL_TABLET | Freq: Three times a day (TID) | ORAL | Status: DC | PRN

## 2015-11-02 NOTE — Discharge Instructions (Signed)

## 2015-11-02 NOTE — ED Provider Notes (Signed)
CSN: [id number]     Arrival date & time 11/02/15  1031 History   First MD Initiated Contact with Patient 11/02/15 1039     Chief Complaint  Patient presents with  . Dizziness    HPI   Emily Osborne is a 55 y.o. female with a PMH of HTN, HLD, asthma who presents to the ED with dizziness, which she states started Monday and has been intermittent since that time. She characterizes her dizziness as feeling off balanced. She also notes lightheadedness. She states she was evaluated by her PCP on Monday, at which time her symptoms were attributed to orthostatic changes and she was advised to increase her fluid intake. She notes she felt better yesterday, but states her symptoms recurred this morning. She reports associated right sided facial numbness that comes and goes as well as difficulty with speech prior to arrival. She notes she called her PCP, who advised her to come to the ED for further evaluation. She denies fever, chills, headache, vision changes, chest pain, shortness of breath, abdominal pain, N/V, weakness, paresthesia.   Past Medical History  Diagnosis Date  . Asthma   . Fibromyalgia   . IBS (irritable bowel syndrome)   . GERD (gastroesophageal reflux disease)   . Hyperlipidemia   . Hypertension   . Personal history of other diseases of digestive system   . Other acute reactions to stress   . Pain in joint, upper arm   . Pain in joint, shoulder region   . Elevated blood pressure reading without diagnosis of hypertension   . Edema   . Pain in limb   . Herpes zoster without mention of complication   . Nontraumatic rupture of Achilles tendon   . Ganglion of joint     right wrist   Past Surgical History  Procedure Laterality Date  . Abdominal hysterectomy    . Tubal ligation    . Bunionectomy Bilateral     Bunionectomy 1983  . Achilles tendon repair Right 2005  . Ganglion cyst excision Right     wrist  . Meniscus repair Right 04/2014   Family History  Problem Relation  Age of Onset  . Coronary artery disease Brother   . Hyperlipidemia Mother   . Hypertension Father   . Diabetes Father   . Heart disease Father   . Kidney disease Father     Died, 50  . Leukemia Brother   . Cancer Brother     myleoblastic anemia  . Hypertension Mother     Died, 62  . Thyroid disease Mother     Thyroid surgery  . Pernicious anemia Sister   . Thyroid disease Sister     On thyroid Rx  . Lupus Daughter   . Hypertension Brother    Social History  Substance Use Topics  . Smoking status: Never Smoker   . Smokeless tobacco: Never Used  . Alcohol Use: No   OB History    No data available      Review of Systems  Constitutional: Negative for fever and chills.  Eyes: Negative for visual disturbance.  Respiratory: Negative for shortness of breath.   Cardiovascular: Negative for chest pain.  Gastrointestinal: Negative for nausea, vomiting and abdominal pain.  Neurological: Positive for dizziness, speech difficulty, light-headedness and numbness. Negative for syncope, weakness and headaches.  All other systems reviewed and are negative.     Allergies  Gentamicin; Aspirin; Doxycycline; Ibuprofen; Influenza a (h1n1) monoval vac; Metronidazole; Nsaids; Ranitidine hcl; Sulfamethoxazole-trimethoprim;  and Tramadol hcl  Home Medications   Prior to Admission medications   Medication Sig Start Date End Date Taking? Authorizing Provider  diclofenac sodium (VOLTAREN) 1 % GEL Apply topically as needed.   Yes Historical Provider, MD  acetaminophen (TYLENOL) 500 MG tablet Take 500 mg by mouth every 6 (six) hours as needed.      Historical Provider, MD  albuterol (PROAIR HFA) 108 (90 Base) MCG/ACT inhaler USE 2 PUFFS EVERY 4 HOURS AS NEEDED FOR COUGH, WHEEZE OR SHORTNESS OF BREATH. 09/12/15   Rosalita Chessman, DO  ALPRAZolam (XANAX) 0.25 MG tablet TAKE 1 TABLET BY MOUTH 3 TIMES A DAY AS NEEDED 08/11/15   Rosalita Chessman, DO  Blood Glucose Monitoring Suppl (FREESTYLE FREEDOM) KIT  1 Device by Does not apply route daily. 06/08/14   Rosalita Chessman, DO  Calcium Citrate-Vitamin D3 1000-400 LIQD 1 po qd 09/14/13   Rosalita Chessman, DO  cholecalciferol (VITAMIN D) 1000 units tablet Take 2,000 Units by mouth daily.    Historical Provider, MD  Cyanocobalamin (B-12) 1000 MCG SUBL 1 sl qd 09/14/13   Rosalita Chessman, DO  Dexlansoprazole 30 MG capsule Take 1 capsule (30 mg total) by mouth daily. 09/09/15   Jerene Bears, MD  fexofenadine (ALLEGRA) 180 MG tablet Take 1 tablet (180 mg total) by mouth daily. 09/14/13   Alferd Apa Lowne, DO  fluticasone (FLONASE) 50 MCG/ACT nasal spray Place 2 sprays into both nostrils daily. 09/16/14   Rosalita Chessman, DO  Fluticasone-Salmeterol (ADVAIR DISKUS) 250-50 MCG/DOSE AEPB Inhale 1 puff into the lungs 2 (two) times daily. 09/12/15   Rosalita Chessman, DO  glucose blood (FREESTYLE LITE) test strip Check blood sugar once daily 06/08/14   Alferd Apa Lowne, DO  lidocaine (LIDODERM) 5 % APPLY 1 PATCH ONTO SKIN EVERY 12 HOURS REMOVE AND DISCARD PATCH WITHIN 12 HOURS OR AS DIRECTED BY MD 07/07/15   Rosalita Chessman, DO  NONFORMULARY OR COMPOUNDED ITEM Compression stockings 20-30 mmhg,  Edema #1 09/02/13   Rosalita Chessman, DO  valACYclovir (VALTREX) 1000 MG tablet TAKE 2 TABLETS BY MOUTH TWICE A DAY FOR 1 DAY AND AS DIRECTED 09/02/13   Alferd Apa Lowne, DO    BP 141/96 mmHg  Pulse 76  Temp(Src) 98.1 F (36.7 C) (Oral)  Resp 16  Ht '5\' 5"'$  (1.651 m)  Wt 101.152 kg  BMI 37.11 kg/m2  SpO2 100% Physical Exam  Constitutional: She is oriented to person, place, and time. She appears well-developed and well-nourished. No distress.  HENT:  Head: Normocephalic and atraumatic.  Right Ear: External ear normal.  Left Ear: External ear normal.  Nose: Nose normal.  Mouth/Throat: Uvula is midline, oropharynx is clear and moist and mucous membranes are normal.  Eyes: Conjunctivae, EOM and lids are normal. Pupils are equal, round, and reactive to light. Right eye exhibits no discharge.  Left eye exhibits no discharge. No scleral icterus.  Neck: Normal range of motion. Neck supple.  Cardiovascular: Normal rate, regular rhythm, normal heart sounds, intact distal pulses and normal pulses.   Pulmonary/Chest: Effort normal and breath sounds normal. No respiratory distress. She has no wheezes. She has no rales.  Abdominal: Soft. Normal appearance and bowel sounds are normal. She exhibits no distension and no mass. There is no tenderness. There is no rigidity, no rebound and no guarding.  Musculoskeletal: Normal range of motion. She exhibits no edema or tenderness.  Neurological: She is alert and oriented to person, place, and time.  She has normal strength. No cranial nerve deficit or sensory deficit. She displays a negative Romberg sign. Coordination and gait normal.  Speech fluent. Patient ambulates without ataxia.  Skin: Skin is warm, dry and intact. No rash noted. She is not diaphoretic. No erythema. No pallor.  Psychiatric: She has a normal mood and affect. Her speech is normal and behavior is normal.  Nursing note and vitals reviewed.   ED Course  Procedures (including critical care time)  Labs Review Labs Reviewed  CBC WITH DIFFERENTIAL/PLATELET  COMPREHENSIVE METABOLIC PANEL  URINALYSIS, ROUTINE W REFLEX MICROSCOPIC (NOT AT [facility])    Imaging Review Ct Head Wo Contrast  11/02/2015  CLINICAL DATA:  Persistent dizziness.  Right facial numbness EXAM: CT HEAD WITHOUT CONTRAST TECHNIQUE: Contiguous axial images were obtained from the base of the skull through the vertex without intravenous contrast. COMPARISON:  Head CT November 03, 2013; brain MRI Dec 24, 2013 FINDINGS: The ventricles are normal in size and configuration. There is no intracranial mass, hemorrhage, extra-axial fluid collection, or midline shift. Gray-white compartments are normal. No acute infarct evident. The bony calvarium appears intact. The mastoid air cells are clear. Visualized orbits appear symmetric  bilaterally. IMPRESSION: Study within normal limits. Electronically Signed   By: Lowella Grip III M.D.   On: 11/02/2015 11:39   I have personally reviewed and evaluated these images and lab results as part of my medical decision-making.   EKG Interpretation None      MDM   Final diagnoses:  Dizziness  Ataxia    55 year old female presents with intermittent dizziness, which she states has been present since Monday. States she feels off balanced and lightheaded. Notes associated right sided facial numbness and difficulty with speech today prior to arrival. Reports she is not currently experiencing these symptoms.   Patient is afebrile. Vital signs stable. Normal neuro exam with no focal deficit. CNs II-XII intact. Coordination intact. Strength and sensation intact. Patient ambulates without ataxia. Speech fluent.   Will obtain labs and CT head.  EKG sinus rhythm, HR 66.  CBC, CMP, UA unremarkable.  Head CT within normal limits.  Patient discussed with and seen by Dr. Venora Maples. Will give meclizine and transfer to [facility] for MRI to further evaluate patient's symptoms. Patient in agreement with plan.  BP 141/96 mmHg  Pulse 76  Temp(Src) 98.1 F (36.7 C) (Oral)  Resp 16  Ht _0  (1.651 m)  Wt 101.152 kg  BMI 37.11 kg/m2  SpO2 100%     Marella Chimes, PA-C 11/02/15 [place], MD 11/02/15 1332

## 2015-11-02 NOTE — ED Notes (Signed)
Patient states that she has been experiencing dizziness over the past week and was seen by her primary MD on Monday and was instructed to increase her fluids. She states she has been going to the gym lately and told by primary that she was orthostatic and partially dehydrated. She also states that the right side of her face feels numb at times and that she is fatigued.

## 2015-11-02 NOTE — ED Notes (Signed)
The patient was able to walk from the waiting room to [place] with no problems.   She advised me that she was given Antivert and it helped a lot.  Daughter is with her at the bedside.

## 2015-11-02 NOTE — ED Notes (Signed)
Patient transported to MRI 

## 2015-11-02 NOTE — ED Provider Notes (Signed)
Patient transferred from [facility] for MRI for concern of dizziness. Please see prior history, physical and prior care for further information.  Briefly this is a 55 year old female with a history of fibromyalgia and GERD who presents with concern for waking with dizziness "lightheadedness" over last 3 days.  Patient evaluated at [facility] and transferred for further evaluation with MRI to rule out stroke.  MRI obtained, which shows no sign of acute infarct. Discussed results with patient. She does report a 30 second episode of right-sided facial numbness and difficulty speaking.  When I discussed possible stroke risk factors the patient, she had denied significant risk factors, however medical record does reveal she does have hypertension and hyperlipidemia. Discussed possibility of observation admission and inability to rule out TIA by MRI, however do feel that given very brief duration of symptoms for 30 seconds, and lack of risk factors on patient's history TIAs less likely. Dizziness frequent episodes and doubt these represent TIA. Patient prefers to have outpatient follow-up and management for this episode which is appropriate.  Discussed reasons to return to the ED in detail.  Given prescription for meclizine. Patient discharged in stable condition with understanding of reasons to return.   Gareth Morgan, MD 11/03/15 [phone]

## 2015-11-02 NOTE — Telephone Encounter (Signed)
Relation to [id number] Call back [phone]   Reason for call:  Patient last seen 10/31/2015 by Dr. Lorelei Pont, patient states she woke up feeling dizzy patient advised to go to ED patient voice understanding.

## 2015-11-02 NOTE — ED Notes (Signed)
Meal tray ordered 

## 2015-11-02 NOTE — ED Notes (Signed)
Patient transported to CT 

## 2015-11-04 NOTE — Addendum Note (Signed)
Addended by: Lamar Blinks C on: 11/04/2015 04:23 PM   Modules accepted: Orders

## 2015-11-12 ENCOUNTER — Other Ambulatory Visit: Payer: Self-pay | Admitting: Physician Assistant

## 2015-11-22 ENCOUNTER — Other Ambulatory Visit: Payer: Self-pay | Admitting: Family Medicine

## 2015-11-28 ENCOUNTER — Ambulatory Visit (INDEPENDENT_AMBULATORY_CARE_PROVIDER_SITE_OTHER): Payer: BC Managed Care – PPO | Admitting: Neurology

## 2015-11-28 ENCOUNTER — Encounter: Payer: Self-pay | Admitting: Neurology

## 2015-11-28 VITALS — BP 120/74 | HR 64 | Ht 65.0 in | Wt 223.2 lb

## 2015-11-28 DIAGNOSIS — R42 Dizziness and giddiness: Secondary | ICD-10-CM | POA: Diagnosis not present

## 2015-11-28 NOTE — Patient Instructions (Signed)
US carotids.  We will call you with the results

## 2015-11-28 NOTE — Progress Notes (Signed)
Follow-up Visit   Date: 11/28/2015    Emily Osborne MRN: [id number] DOB: [birth year]   Interim History: Emily Osborne is a 55 y.o. right-handed African American female with history of asthma, fibromyalgia, GERD, returning to the clinic regarding new complaints of spells of weakness and imbalance. She is a Physiological scientist at [employer]   History of present illness: On January 25th, 2015 she was in [place] getting a newspaper and she developed sudden onset of weakness/numbness involving the left arm and leg. She was unable to walk, but did not fall so held onto her shopping cart. She was able to walk to the car and when she arrived home, she felt weak and nauseous. She felt that her entire body was weak all day long and developed chest pain in the evening. By the following day, her weakness and numbness had resolved.   Since then, she feels as if she is getting weaker each day. Symptoms are better in the morning and she feels tired by the afternoon. In March, she developed a "wave of weakness starting in the left arm and then generalized over her entire body". She also has spells of left eye twitching, left leg achiness, and left arm pain.   She has had problems with left shoulder pain and had a steroid injection as well as imaging of shoulder which shows a partial tear of the supraspinatus muscle. Additional work-up has included MRI brain, MRI left shoulder, MRI cervical spine, and serology testing which is notable for chronic white matter changes of the brain, mild rotator cuff tendinopathy (partial-thickness tear of the supraspinatus), and low TSH.  - Follow-up 12/15/2013:  She presents today to discuss results of CT/A head and neck, EMG, and labs (MG panel, CK, aldolase, B12) which was all normal.  She continues to have left arm discomfort, described as if her arm is being pulled.  She has found that lidoderm patch, tylenol, and eucalyptus cream helps.  It is improved when she extends the  arm. There is no history of vision loss, bowel/bladder incontinence, muscle stiffness, or falls.  She endorses memory changes, generalized fatigue, and worsening of symptoms in warmer temperatures.  - UPDATE 11/28/2015:   She was last seen in the office in 2017 and presents today with intermittent spells of fatigue and imbalance.  In March 2017, she reports having a few spells of dizziness and was staggering. There was no spinning. She has associated left ear discomfort and pain.  She was evaluated in the emergency department and was given meclizine which helped.  Last week, she was at a leadership conference and felt imbalanced as if she was falling. After sitting down, symptoms improved.  Since then, she had several spells of fatigue and malaise lasting 1-2 days following these events.  No loss of consciousness.  Today, she is doing well and denies any complaints.   Medications:  Current Outpatient Prescriptions on File Prior to Visit  Medication Sig Dispense Refill  . acetaminophen (TYLENOL) 500 MG tablet Take 500 mg by mouth every 6 (six) hours as needed.      . ALAWAY 0.025 % ophthalmic solution PLACE 1 DROP IN BOTH EYES TWICE A DAY 10 mL 0  . albuterol (PROAIR HFA) 108 (90 Base) MCG/ACT inhaler USE 2 PUFFS EVERY 4 HOURS AS NEEDED FOR COUGH, WHEEZE OR SHORTNESS OF BREATH. 8.5 each 2  . ALPRAZolam (XANAX) 0.25 MG tablet TAKE 1 TABLET BY MOUTH 3 TIMES A DAY AS NEEDED (Patient not taking:  Reported on 11/02/2015) 30 tablet 0  . Blood Glucose Monitoring Suppl (FREESTYLE FREEDOM) KIT 1 Device by Does not apply route daily. 1 each 0  . Calcium Citrate-Vitamin D3 1000-400 LIQD 1 po qd (Patient taking differently: Take 1 tablet by mouth daily. 1 po qd) 480 mL 5  . cholecalciferol (VITAMIN D) 1000 units tablet Take 2,000 Units by mouth daily.    . Cyanocobalamin (B-12) 1000 MCG SUBL 1 sl qd (Patient taking differently: 1 tablet. 1 sl qd) 90 each 3  . Dexlansoprazole 30 MG capsule Take 1 capsule (30 mg total)  by mouth daily. 90 capsule 3  . diclofenac sodium (VOLTAREN) 1 % GEL Apply 2 g topically daily as needed. For pain    . fexofenadine (ALLEGRA) 180 MG tablet Take 1 tablet (180 mg total) by mouth daily. 90 tablet 3  . fluticasone (FLONASE) 50 MCG/ACT nasal spray PLACE 2 SPRAYS INTO BOTH NOSTRILS DAILY. 16 g 4  . Fluticasone-Salmeterol (ADVAIR DISKUS) 250-50 MCG/DOSE AEPB Inhale 1 puff into the lungs 2 (two) times daily. 60 each 2  . glucose blood (FREESTYLE LITE) test strip Check blood sugar once daily 100 each 2  . lidocaine (LIDODERM) 5 % APPLY 1 PATCH ONTO SKIN EVERY 12 HOURS REMOVE AND DISCARD PATCH WITHIN 12 HOURS OR AS DIRECTED BY MD (Patient not taking: Reported on 11/02/2015) 30 patch 1  . lidocaine (LIDODERM) 5 % Place 1 patch onto the skin daily as needed. Take as needed for pain    . meclizine (ANTIVERT) 32 MG tablet Take 1 tablet (32 mg total) by mouth 3 (three) times daily as needed for dizziness. 30 tablet 0  . NONFORMULARY OR COMPOUNDED ITEM Compression stockings 20-30 mmhg,  Edema #1 (Patient not taking: Reported on 11/02/2015) 1 each 1  . valACYclovir (VALTREX) 1000 MG tablet TAKE 2 TABLETS BY MOUTH TWICE A DAY FOR 1 DAY AND AS DIRECTED (Patient not taking: Reported on 11/02/2015) 30 tablet 5   No current facility-administered medications on file prior to visit.    Allergies:  Allergies  Allergen Reactions  . Gentamicin     Eye drops turned the sclera bright red  . Aspirin     REACTION: anaphylaxis  . Doxycycline     REACTION: severe nausea/vomiting  . Ibuprofen     REACTION: anaphylaxsis  . Influenza A (H1n1) Monoval Vac     REACTION: sick for 3 weeks  . Metronidazole     REACTION: red face/swelling  . Nsaids     REACTION: anaphylaxis  . Ranitidine Hcl     REACTION: Lips re/peel  . Sulfamethoxazole-Trimethoprim     REACTION: face red/peel  . Tramadol Hcl     REACTION: paranoid     Review of Systems:  CONSTITUTIONAL: No fevers, chills, night sweats, or weight  loss.   EYES: No visual changes or eye pain ENT: No hearing changes.  No history of nose bleeds.   RESPIRATORY: No cough, wheezing and shortness of breath.   CARDIOVASCULAR: Negative for chest pain, and palpitations.   GI: Negative for abdominal discomfort, blood in stools or black stools.  No recent change in bowel habits.   GU:  No history of incontinence.   MUSCLOSKELETAL: + history of joint pain or swelling.  + myalgias.   SKIN: Negative for lesions, rash, and itching.   ENDOCRINE: Negative for cold or heat intolerance, polydipsia or goiter.   PSYCH:  + depression or anxiety symptoms.   NEURO: As Above.   Vital Signs:  BP 120/74 mmHg  Pulse 64  Ht 5\' 5"  (1.651 m)  Wt 223 lb 3 oz (101.237 kg)  BMI 37.14 kg/m2  Neurological Exam: MENTAL STATUS including orientation to time, place, person, recent and remote memory, attention span and concentration, language, and fund of knowledge is normal.  Speech is not dysarthric.  CRANIAL NERVES: Normal fundoscopic exam.  No visual field defects. Pupils equal round and reactive to light.  Normal conjugate, extra-ocular eye movements in all directions of gaze. No nystagmus.   No ptosis. Normal facial sensation.  Face is symmetric. Palate elevates symmetrically.  Tongue is midline. There is no Myerson sign or snout reflex.  Head thrust bilaterally is negative.   MOTOR:  Motor strength is 5/5 in all extremities. Tone is normal throughout.  No atrophy, fasciculations or abnormal movements.  No pronator drift.    MSRs:  Brisk and symmetrica reflexes bilaterally 2+/4.  Plantar responses are down going  SENSORY:  Vibration and pin prick intact throughout.  Rhomberg testing is negative.   COORDINATION/GAIT:  Normal finger-to- nose-finger.  Intact rapid alternating movements bilaterally.  Gait narrow based and stable. She is unsteady with tandem gait.    Data: Labs 10/28/2013:  TSH 0.29*, fT3 2.9, fT4 0.9 Labs 11/02/2013:  AChR  binding/blocking/modulating - negative, aldolase 5.0, CK 159, B12 1075  Lab Results  Component Value Date   TSH 0.11* 10/31/2015   Lab Results  Component Value Date   VITAMINB12 820 09/07/2014    EMG left upper extremity 12/03/2013:  This is a normal study of the left upper extremity. In particular, there is no evidence of cervical radiculopathy or carpal tunnel syndrome.  CT/A head and neck 11/03/2013:   Normal CT of the head.  Normal CTA of the head and neck. No evidence of atherosclerotic  disease or dissection.  MRI cervical spine wo contrast 10/29/2013: Minimal degenerative changes in the cervical spine, stable. No neural impingement.  MRI shoulder 09/25/2013: 1. Degenerative glenohumeral arthropathy with chondral thinning and multiple degenerative subcortical cystic lesions along the glenoid.  2. Mild blunting of the anterior and posterior labrum, but without a well-defined tear.  3. Mild rotator cuff tendinopathy, with a partial-thickness intrasubstance insertional tear of the distal posterior supraspinatus.  MRI brain 09/17/2013: 1. Periventricular and subcortical white matter disease is advanced for age. The finding is nonspecific but can be seen in the setting of chronic microvascular ischemia, a demyelinating process such as multiple sclerosis, vasculitis, complicated migraine headaches, or as the sequelae of a prior infectious or inflammatory process.  2. No acute intracranial abnormality.  3. Right greater than left maxillary sinus disease.  Routine EEG 12/22/2013:  This awake and asleep EEG is mildly abnormal due to rare focal slowing over the left temporal region.  CT/A head and neck 11/03/2013:  Normal CT of the head.  Normal CTA of the head and neck. No evidence of atherosclerotic  disease or dissection.  MRI brain 11/02/2015:  Mild chronic white matter changes stable since 2015. No acute abnormality.  IMPRESSION: Ms. Stamer is a 55 year-old female returning for new  complaints of episodic dizziness and gait instability.  Her exam is entirely normal.  Her dizziness has improved with meclezine, but she continues to have mild difficulty with balance.  Her work-up included MRI brain which was normal.   Vessel imaging of the brain and neck shows patent vasculature in 2015.  Imaging of the carotid arteries can certainly be repeated to be sure there is no interval changes,  but my overall suspicion is low.  Because of her associated left ear discomfort, BPPV is certainly possible.  At this time, she does not have any vertigo, only mild difficulty with tandem gait.  If symptoms return, she may need to be evaluated by ENT.   She has previously underwent extensive neurological testing including MRI brain and cervical spine, EEG, EMG, and CT/A head and neck for various symptoms including left sided weakness, paresthesias, and memory changes.  EEG showed rare focal slowing over the left temporal region, but MRI brian did not show any structural lesions.  There was no epileptiform discharges.   Labs show normal CK, B12, MG panel, copper, zinc.      The duration of this appointment visit was 30 minutes of face-to-face time with the patient.  Greater than 50% of this time was spent in counseling, explanation of diagnosis, planning of further management, and coordination of care.   Thank you for allowing me to participate in patient's care.  If I can answer any additional questions, I would be pleased to do so.    Sincerely,    Karlen Barbar K. Allena Katz, DO

## 2015-12-02 ENCOUNTER — Ambulatory Visit (HOSPITAL_COMMUNITY)
Admission: RE | Admit: 2015-12-02 | Discharge: 2015-12-02 | Disposition: A | Discharge status: Home / Self Care | Payer: BC Managed Care – PPO | Source: Ambulatory Visit | Attending: Cardiovascular Disease | Admitting: Cardiovascular Disease

## 2015-12-02 DIAGNOSIS — I6523 Occlusion and stenosis of bilateral carotid arteries: Secondary | ICD-10-CM | POA: Insufficient documentation

## 2015-12-02 DIAGNOSIS — E785 Hyperlipidemia, unspecified: Secondary | ICD-10-CM | POA: Diagnosis not present

## 2015-12-02 DIAGNOSIS — I10 Essential (primary) hypertension: Secondary | ICD-10-CM | POA: Diagnosis not present

## 2015-12-02 DIAGNOSIS — R42 Dizziness and giddiness: Secondary | ICD-10-CM | POA: Insufficient documentation

## 2015-12-02 DIAGNOSIS — K219 Gastro-esophageal reflux disease without esophagitis: Secondary | ICD-10-CM | POA: Insufficient documentation

## 2015-12-07 ENCOUNTER — Encounter (HOSPITAL_BASED_OUTPATIENT_CLINIC_OR_DEPARTMENT_OTHER): Payer: Self-pay

## 2015-12-07 ENCOUNTER — Other Ambulatory Visit: Payer: Self-pay | Admitting: Gastroenterology

## 2015-12-07 ENCOUNTER — Encounter: Payer: Self-pay | Admitting: Medical

## 2015-12-07 ENCOUNTER — Emergency Department (HOSPITAL_BASED_OUTPATIENT_CLINIC_OR_DEPARTMENT_OTHER): Payer: BC Managed Care – PPO

## 2015-12-07 ENCOUNTER — Ambulatory Visit (INDEPENDENT_AMBULATORY_CARE_PROVIDER_SITE_OTHER): Payer: BC Managed Care – PPO | Admitting: Medical

## 2015-12-07 ENCOUNTER — Inpatient Hospital Stay (HOSPITAL_BASED_OUTPATIENT_CLINIC_OR_DEPARTMENT_OTHER)
Admission: EM | Admit: 2015-12-07 | Discharge: 2015-12-09 | DRG: 392 | Disposition: A | Discharge status: Home / Self Care | Payer: BC Managed Care – PPO | Attending: Internal Medicine | Admitting: Internal Medicine

## 2015-12-07 VITALS — BP 118/78 | HR 87 | Temp 98.1°F | Ht 65.0 in | Wt 221.4 lb

## 2015-12-07 DIAGNOSIS — Z806 Family history of leukemia: Secondary | ICD-10-CM | POA: Diagnosis not present

## 2015-12-07 DIAGNOSIS — Z7951 Long term (current) use of inhaled steroids: Secondary | ICD-10-CM | POA: Diagnosis not present

## 2015-12-07 DIAGNOSIS — K589 Irritable bowel syndrome without diarrhea: Secondary | ICD-10-CM | POA: Diagnosis present

## 2015-12-07 DIAGNOSIS — K5720 Diverticulitis of large intestine with perforation and abscess without bleeding: Secondary | ICD-10-CM | POA: Diagnosis present

## 2015-12-07 DIAGNOSIS — IMO0001 Reserved for inherently not codable concepts without codable children: Secondary | ICD-10-CM | POA: Insufficient documentation

## 2015-12-07 DIAGNOSIS — I5032 Chronic diastolic (congestive) heart failure: Secondary | ICD-10-CM | POA: Diagnosis present

## 2015-12-07 DIAGNOSIS — I110 Hypertensive heart disease with heart failure: Secondary | ICD-10-CM | POA: Diagnosis present

## 2015-12-07 DIAGNOSIS — J449 Chronic obstructive pulmonary disease, unspecified: Secondary | ICD-10-CM

## 2015-12-07 DIAGNOSIS — M797 Fibromyalgia: Secondary | ICD-10-CM | POA: Diagnosis present

## 2015-12-07 DIAGNOSIS — F419 Anxiety disorder, unspecified: Secondary | ICD-10-CM | POA: Diagnosis present

## 2015-12-07 DIAGNOSIS — R1032 Left lower quadrant pain: Secondary | ICD-10-CM | POA: Diagnosis present

## 2015-12-07 DIAGNOSIS — Z8249 Family history of ischemic heart disease and other diseases of the circulatory system: Secondary | ICD-10-CM

## 2015-12-07 DIAGNOSIS — Z860100 Personal history of colon polyps, unspecified: Secondary | ICD-10-CM

## 2015-12-07 DIAGNOSIS — Z841 Family history of disorders of kidney and ureter: Secondary | ICD-10-CM | POA: Diagnosis not present

## 2015-12-07 DIAGNOSIS — R1084 Generalized abdominal pain: Secondary | ICD-10-CM

## 2015-12-07 DIAGNOSIS — K219 Gastro-esophageal reflux disease without esophagitis: Secondary | ICD-10-CM | POA: Diagnosis present

## 2015-12-07 DIAGNOSIS — Z833 Family history of diabetes mellitus: Secondary | ICD-10-CM

## 2015-12-07 DIAGNOSIS — I509 Heart failure, unspecified: Secondary | ICD-10-CM | POA: Diagnosis not present

## 2015-12-07 DIAGNOSIS — K5792 Diverticulitis of intestine, part unspecified, without perforation or abscess without bleeding: Secondary | ICD-10-CM | POA: Diagnosis present

## 2015-12-07 DIAGNOSIS — J45909 Unspecified asthma, uncomplicated: Secondary | ICD-10-CM | POA: Diagnosis present

## 2015-12-07 DIAGNOSIS — E785 Hyperlipidemia, unspecified: Secondary | ICD-10-CM | POA: Diagnosis present

## 2015-12-07 LAB — URINALYSIS, ROUTINE W REFLEX MICROSCOPIC
BILIRUBIN URINE: NEGATIVE
Glucose, UA: NEGATIVE mg/dL
HGB URINE DIPSTICK: NEGATIVE
Ketones, ur: NEGATIVE mg/dL
Leukocytes, UA: NEGATIVE
Nitrite: NEGATIVE
PH: 6 (ref 5.0–8.0)
Protein, ur: NEGATIVE mg/dL
SPECIFIC GRAVITY, URINE: 1.011 (ref 1.005–1.030)

## 2015-12-07 LAB — CBC WITH DIFFERENTIAL/PLATELET
BASOS PCT: 0 %
Basophils Absolute: 0 10*3/uL (ref 0.0–0.1)
EOS ABS: 0.2 10*3/uL (ref 0.0–0.7)
EOS PCT: 2 %
HCT: 38.2 % (ref 36.0–46.0)
Hemoglobin: 13 g/dL (ref 12.0–15.0)
LYMPHS ABS: 2.4 10*3/uL (ref 0.7–4.0)
Lymphocytes Relative: 30 %
MCH: 28.8 pg (ref 26.0–34.0)
MCHC: 34 g/dL (ref 30.0–36.0)
MCV: 84.7 fL (ref 78.0–100.0)
MONOS PCT: 9 %
Monocytes Absolute: 0.7 10*3/uL (ref 0.1–1.0)
Neutro Abs: 4.9 10*3/uL (ref 1.7–7.7)
Neutrophils Relative %: 59 %
PLATELETS: 246 10*3/uL (ref 150–400)
RBC: 4.51 MIL/uL (ref 3.87–5.11)
RDW: 13.2 % (ref 11.5–15.5)
WBC: 8.2 10*3/uL (ref 4.0–10.5)

## 2015-12-07 LAB — CBC
HCT: 37.7 % (ref 36.0–46.0)
Hemoglobin: 12.3 g/dL (ref 12.0–15.0)
MCH: 28 pg (ref 26.0–34.0)
MCHC: 32.6 g/dL (ref 30.0–36.0)
MCV: 85.7 fL (ref 78.0–100.0)
Platelets: 232 10*3/uL (ref 150–400)
RBC: 4.4 MIL/uL (ref 3.87–5.11)
RDW: 13.4 % (ref 11.5–15.5)
WBC: 8.4 10*3/uL (ref 4.0–10.5)

## 2015-12-07 LAB — COMPREHENSIVE METABOLIC PANEL
ALK PHOS: 62 U/L (ref 38–126)
ALT: 28 U/L (ref 14–54)
ANION GAP: 6 (ref 5–15)
AST: 25 U/L (ref 15–41)
Albumin: 3.9 g/dL (ref 3.5–5.0)
BILIRUBIN TOTAL: 0.6 mg/dL (ref 0.3–1.2)
BUN: 10 mg/dL (ref 6–20)
CALCIUM: 9.3 mg/dL (ref 8.9–10.3)
CO2: 28 mmol/L (ref 22–32)
CREATININE: 0.76 mg/dL (ref 0.44–1.00)
Chloride: 108 mmol/L (ref 101–111)
GFR calc non Af Amer: >60 mL/min (ref >60)
GLUCOSE: 95 mg/dL (ref 65–99)
Potassium: 3.8 mmol/L (ref 3.5–5.1)
SODIUM: 142 mmol/L (ref 135–145)
TOTAL PROTEIN: 7.1 g/dL (ref 6.5–8.1)

## 2015-12-07 LAB — TSH: TSH: 0.021 u[IU]/mL — ABNORMAL LOW (ref 0.350–4.500)

## 2015-12-07 LAB — CREATININE, SERUM
CREATININE: 0.95 mg/dL (ref 0.44–1.00)
GFR calc Af Amer: >60 mL/min (ref >60)
GFR calc non Af Amer: >60 mL/min (ref >60)

## 2015-12-07 LAB — LIPASE, BLOOD: Lipase: 19 U/L (ref 11–51)

## 2015-12-07 MED ORDER — FLUTICASONE PROPIONATE 50 MCG/ACT NA SUSP
1.0000 | Freq: Every day | NASAL | Status: DC
  Administered 2015-12-07 – 2015-12-08 (×2): 1 via NASAL
  Filled 2015-12-07: qty 16

## 2015-12-07 MED ORDER — HYDROMORPHONE HCL 1 MG/ML IJ SOLN
INTRAMUSCULAR | Status: AC
  Filled 2015-12-07: qty 1

## 2015-12-07 MED ORDER — IOPAMIDOL (ISOVUE-300) INJECTION 61%
100.0000 mL | Freq: Once | INTRAVENOUS | Status: AC | PRN
  Administered 2015-12-07: 100 mL via INTRAVENOUS

## 2015-12-07 MED ORDER — HYDROMORPHONE HCL 1 MG/ML IJ SOLN
1.0000 mg | Freq: Once | INTRAMUSCULAR | Status: AC
  Administered 2015-12-07: 1 mg via INTRAVENOUS
  Filled 2015-12-07: qty 1

## 2015-12-07 MED ORDER — HYDROCODONE-ACETAMINOPHEN 5-325 MG PO TABS
1.0000 | ORAL_TABLET | ORAL | Status: DC | PRN
  Administered 2015-12-07 – 2015-12-08 (×2): 2 via ORAL
  Administered 2015-12-08: 1 via ORAL
  Administered 2015-12-08 (×2): 2 via ORAL
  Filled 2015-12-07 (×5): qty 2

## 2015-12-07 MED ORDER — LIDOCAINE 5 % EX PTCH
1.0000 | MEDICATED_PATCH | CUTANEOUS | Status: DC
  Administered 2015-12-07 – 2015-12-08 (×2): 1 via TRANSDERMAL
  Filled 2015-12-07 (×2): qty 1

## 2015-12-07 MED ORDER — SODIUM CHLORIDE 0.9 % IV SOLN
INTRAVENOUS | Status: DC
  Administered 2015-12-07 – 2015-12-08 (×2): via INTRAVENOUS

## 2015-12-07 MED ORDER — VALACYCLOVIR HCL 500 MG PO TABS
500.0000 mg | ORAL_TABLET | Freq: Two times a day (BID) | ORAL | Status: DC
  Administered 2015-12-07 – 2015-12-08 (×2): 500 mg via ORAL
  Filled 2015-12-07 (×2): qty 1

## 2015-12-07 MED ORDER — HEPARIN SODIUM (PORCINE) 5000 UNIT/ML IJ SOLN
5000.0000 [IU] | Freq: Three times a day (TID) | INTRAMUSCULAR | Status: DC
  Filled 2015-12-07 (×2): qty 1

## 2015-12-07 MED ORDER — ONDANSETRON HCL 4 MG/2ML IJ SOLN
4.0000 mg | Freq: Four times a day (QID) | INTRAMUSCULAR | Status: DC | PRN
  Filled 2015-12-07: qty 2

## 2015-12-07 MED ORDER — MECLIZINE HCL 25 MG PO TABS
32.0000 mg | ORAL_TABLET | Freq: Three times a day (TID) | ORAL | Status: DC | PRN
  Filled 2015-12-07: qty 0.5

## 2015-12-07 MED ORDER — HYDROMORPHONE HCL 1 MG/ML IJ SOLN
1.0000 mg | Freq: Once | INTRAMUSCULAR | Status: AC
  Administered 2015-12-07: 1 mg via INTRAVENOUS

## 2015-12-07 MED ORDER — VITAMIN D 1000 UNITS PO TABS
2000.0000 [IU] | ORAL_TABLET | Freq: Every day | ORAL | Status: DC
  Administered 2015-12-08 – 2015-12-09 (×2): 2000 [IU] via ORAL
  Filled 2015-12-07 (×3): qty 2

## 2015-12-07 MED ORDER — SODIUM CHLORIDE 0.9 % IV SOLN
INTRAVENOUS | Status: DC
Start: 2015-12-07 — End: 2015-12-07

## 2015-12-07 MED ORDER — FENTANYL CITRATE (PF) 100 MCG/2ML IJ SOLN
100.0000 ug | Freq: Once | INTRAMUSCULAR | Status: AC
  Administered 2015-12-07: 100 ug via INTRAVENOUS
  Filled 2015-12-07: qty 2

## 2015-12-07 MED ORDER — PIPERACILLIN-TAZOBACTAM 3.375 G IVPB 30 MIN
3.3750 g | Freq: Once | INTRAVENOUS | Status: AC
Start: 2015-12-07 — End: 2015-12-07
  Administered 2015-12-07: 3.375 g via INTRAVENOUS
  Filled 2015-12-07 (×2): qty 50

## 2015-12-07 MED ORDER — SODIUM CHLORIDE 0.9 % IV SOLN
3.0000 g | Freq: Four times a day (QID) | INTRAVENOUS | Status: DC
  Administered 2015-12-07 – 2015-12-09 (×6): 3 g via INTRAVENOUS
  Filled 2015-12-07 (×11): qty 3

## 2015-12-07 MED ORDER — ALPRAZOLAM 0.25 MG PO TABS
0.2500 mg | ORAL_TABLET | Freq: Three times a day (TID) | ORAL | Status: DC | PRN
  Administered 2015-12-07 – 2015-12-08 (×2): 0.25 mg via ORAL
  Filled 2015-12-07 (×2): qty 1

## 2015-12-07 MED ORDER — SODIUM CHLORIDE 0.9 % IV BOLUS (SEPSIS)
1000.0000 mL | Freq: Once | INTRAVENOUS | Status: AC
  Administered 2015-12-07: 1000 mL via INTRAVENOUS

## 2015-12-07 MED ORDER — ONDANSETRON HCL 4 MG/2ML IJ SOLN
4.0000 mg | Freq: Once | INTRAMUSCULAR | Status: AC
Start: 2015-12-07 — End: 2015-12-07
  Administered 2015-12-07: 4 mg via INTRAVENOUS
  Filled 2015-12-07: qty 2

## 2015-12-07 MED ORDER — PANTOPRAZOLE SODIUM 40 MG PO TBEC
40.0000 mg | DELAYED_RELEASE_TABLET | Freq: Every day | ORAL | Status: DC
  Administered 2015-12-08 – 2015-12-09 (×2): 40 mg via ORAL
  Filled 2015-12-07 (×3): qty 1

## 2015-12-07 MED ORDER — LORATADINE 10 MG PO TABS
10.0000 mg | ORAL_TABLET | Freq: Every day | ORAL | Status: DC
  Administered 2015-12-08: 10 mg via ORAL
  Filled 2015-12-07: qty 1

## 2015-12-07 MED ORDER — MORPHINE SULFATE (PF) 2 MG/ML IV SOLN
2.0000 mg | INTRAVENOUS | Status: DC | PRN
  Administered 2015-12-07 – 2015-12-08 (×3): 2 mg via INTRAVENOUS
  Filled 2015-12-07 (×3): qty 1

## 2015-12-07 MED ORDER — KETOTIFEN FUMARATE 0.025 % OP SOLN
2.0000 [drp] | Freq: Two times a day (BID) | OPHTHALMIC | Status: DC
  Administered 2015-12-08: 2 [drp] via OPHTHALMIC
  Filled 2015-12-07: qty 5

## 2015-12-07 MED ORDER — FREESTYLE FREEDOM KIT: 1.0000 | PACK | Freq: Every day | Status: DC

## 2015-12-07 MED ORDER — MECLIZINE HCL 25 MG PO TABS
25.0000 mg | ORAL_TABLET | Freq: Three times a day (TID) | ORAL | Status: DC | PRN
  Filled 2015-12-07: qty 1

## 2015-12-07 MED ORDER — DOCUSATE SODIUM 100 MG PO CAPS
100.0000 mg | ORAL_CAPSULE | Freq: Every day | ORAL | Status: DC
  Administered 2015-12-08: 100 mg via ORAL
  Filled 2015-12-07 (×2): qty 1

## 2015-12-07 MED ORDER — GI COCKTAIL ~~LOC~~
30.0000 mL | Freq: Once | ORAL | Status: DC
  Administered 2015-12-07: 30 mL via ORAL

## 2015-12-07 MED ORDER — ONDANSETRON HCL 4 MG PO TABS: 4.0000 mg | ORAL_TABLET | Freq: Four times a day (QID) | ORAL | Status: DC | PRN

## 2015-12-07 MED ORDER — MOMETASONE FURO-FORMOTEROL FUM 200-5 MCG/ACT IN AERO
2.0000 | INHALATION_SPRAY | Freq: Two times a day (BID) | RESPIRATORY_TRACT | Status: DC
  Administered 2015-12-08 – 2015-12-09 (×3): 2 via RESPIRATORY_TRACT
  Filled 2015-12-07 (×2): qty 8.8

## 2015-12-07 NOTE — Progress Notes (Signed)
55 year old female presents with abdominal pain, CT abdomen pelvis significant for diverticulitis with microperforation but no abscess or free air, accepted to [facility] floor, will be started on Zosyn, nothing by mouth, on IV fluids. Phillips Climes MD

## 2015-12-07 NOTE — Addendum Note (Signed)
Addended by: Tasia Catchings on: 12/07/2015 12:01 PM   Modules accepted: Orders

## 2015-12-07 NOTE — Progress Notes (Signed)
Emily Osborne [id number] Admission Data: 12/07/2015 5:24 PM Attending Provider: To Be Announced [id number] R Carollee Herter, DO Consults/ Treatment Team:    Emily Osborne is a 55 y.o. female patient admitted from ED awake, alert  & orientated  X 3,  No Order, VSS - Blood pressure 131/89, pulse 70, temperature 98.6 F (37 C), temperature source Oral, resp. rate 11, height 5\' 5"  (1.651 m), weight 100.245 kg (221 lb), SpO2 100 %., room air, no c/o shortness of breath, no c/o chest pain, no distress noted.    IV site [id number] AC with a transparent dsg that's clean dry and intact.  Allergies:   Allergies  Allergen Reactions  . Gentamicin     Eye drops turned the sclera bright red  . Aspirin     REACTION: anaphylaxis  . Doxycycline     REACTION: severe nausea/vomiting  . Ibuprofen     REACTION: anaphylaxsis  . Influenza A (H1n1) Monoval Vac     REACTION: sick for 3 weeks  . Metronidazole     REACTION: red face/swelling  . Nsaids     REACTION: anaphylaxis  . Ranitidine Hcl     REACTION: Lips re/peel  . Sulfamethoxazole-Trimethoprim     REACTION: face red/peel  . Tramadol Hcl     REACTION: paranoid     Past Medical History  Diagnosis Date  . Asthma   . Fibromyalgia   . IBS (irritable bowel syndrome)   . GERD (gastroesophageal reflux disease)   . Hyperlipidemia   . Hypertension   . Personal history of other diseases of digestive system   . Other acute reactions to stress   . Pain in joint, upper arm   . Pain in joint, shoulder region   . Elevated blood pressure reading without diagnosis of hypertension   . Edema   . Pain in limb   . Herpes zoster without mention of complication   . Nontraumatic rupture of Achilles tendon   . Ganglion of joint     right wrist   Pt orientation to unit, room and routine. Information packet given to patient/family. Admission INP armband ID verified with patient/family, and in place. SR up x 2, fall risk assessment complete with Patient and  family verbalizing understanding of risks associated with falls. Pt verbalizes an understanding of how to use the call bell and to call for help before getting out of bed.  Skin, clean-dry- intact without evidence of bruising, or skin tears.   No evidence of skin break down noted on exam. Skin assessment done with Ginger RN  Pt resting in bed. Bed low and locked. Call bell within reach. Family members at bedside. Will cont to monitor and assist as needed.  Dorita Fray, RN 12/07/2015 5:27 PM

## 2015-12-07 NOTE — ED Provider Notes (Signed)
CSN: [id number]     Arrival date & time 12/07/15  1133 History   First MD Initiated Contact with Patient 12/07/15 1147     Chief Complaint  Patient presents with  . Abdominal Pain     (Consider location/radiation/quality/duration/timing/severity/associated sxs/prior Treatment) HPI  55 year old female presents with abdominal pain. Started 4 days ago. She's not sure if it was related to the food she ate that day. Pain has been progressive worsening with bloating. He was nauseated and feels like she would feel better if she could just throw up. No diarrhea. No dysuria but has been urinating frequently. Pain was more upper but now is in her lower abdomen. This morning after drinking coffee it seemed much more severe. Went to her PCPs office where they gave her a GI cocktail and referred her to the ER. Patient states the cocktail seemed to make the symptoms worse. She has GERD but this is different. Pain currently severe.  Past Medical History  Diagnosis Date  . Asthma   . Fibromyalgia   . IBS (irritable bowel syndrome)   . GERD (gastroesophageal reflux disease)   . Hyperlipidemia   . Hypertension   . Personal history of other diseases of digestive system   . Other acute reactions to stress   . Pain in joint, upper arm   . Pain in joint, shoulder region   . Elevated blood pressure reading without diagnosis of hypertension   . Edema   . Pain in limb   . Herpes zoster without mention of complication   . Nontraumatic rupture of Achilles tendon   . Ganglion of joint     right wrist   Past Surgical History  Procedure Laterality Date  . Abdominal hysterectomy    . Tubal ligation    . Bunionectomy Bilateral     Bunionectomy 1983  . Achilles tendon repair Right 2005  . Ganglion cyst excision Right     wrist  . Meniscus repair Right 04/2014   Family History  Problem Relation Age of Onset  . Coronary artery disease Brother   . Hyperlipidemia Mother   . Hypertension Father   .  Diabetes Father   . Heart disease Father   . Kidney disease Father     Died, 47  . Leukemia Brother   . Cancer Brother     myleoblastic anemia  . Hypertension Mother     Died, 44  . Thyroid disease Mother     Thyroid surgery  . Pernicious anemia Sister   . Thyroid disease Sister     On thyroid Rx  . Lupus Daughter   . Hypertension Brother    Social History  Substance Use Topics  . Smoking status: Never Smoker   . Smokeless tobacco: Never Used  . Alcohol Use: No   OB History    No data available     Review of Systems  Constitutional: Negative for fever.  Cardiovascular: Negative for chest pain.  Gastrointestinal: Positive for nausea and abdominal pain. Negative for vomiting.  Genitourinary: Positive for frequency. Negative for dysuria.  All other systems reviewed and are negative.     Allergies  Gentamicin; Aspirin; Doxycycline; Ibuprofen; Influenza a (h1n1) monoval vac; Metronidazole; Nsaids; Ranitidine hcl; Sulfamethoxazole-trimethoprim; and Tramadol hcl  Home Medications   Prior to Admission medications   Medication Sig Start Date End Date Taking? Authorizing Provider  acetaminophen (TYLENOL) 500 MG tablet Take 500 mg by mouth every 6 (six) hours as needed.      Historical  Provider, MD  ALAWAY 0.025 % ophthalmic solution PLACE 1 DROP IN BOTH EYES TWICE A DAY 11/12/15   Brunetta Jeans, PA-C  albuterol [facility] HFA) 108 (90 Base) MCG/ACT inhaler USE 2 PUFFS EVERY 4 HOURS AS NEEDED FOR COUGH, WHEEZE OR SHORTNESS OF BREATH. 09/12/15   Rosalita Chessman [name], DO  ALPRAZolam (XANAX) 0.25 MG tablet TAKE 1 TABLET BY MOUTH 3 TIMES A DAY AS NEEDED 08/11/15   Rosalita Chessman [name], DO  Blood Glucose Monitoring Suppl (FREESTYLE FREEDOM) KIT 1 Device by Does not apply route daily. 06/08/14   Rosalita Chessman [name], DO  Calcium Citrate-Vitamin D3 1000-400 LIQD 1 po qd Patient taking differently: Take 1 tablet by mouth daily. 1 po qd 09/14/13   Rosalita Chessman [name], DO  cholecalciferol  (VITAMIN D) 1000 units tablet Take 2,000 Units by mouth daily.    Historical Provider, MD  Cyanocobalamin (B-12) 1000 MCG SUBL 1 sl qd Patient taking differently: 1 tablet. 1 sl qd 09/14/13   Alferd Apa Lowne [name], DO  Dexlansoprazole 30 MG capsule Take 1 capsule (30 mg total) by mouth daily. 09/09/15   Jerene Bears, MD  diclofenac sodium (VOLTAREN) 1 % GEL Apply 2 g topically daily as needed. For pain    Historical Provider, MD  fexofenadine (ALLEGRA) 180 MG tablet Take 1 tablet (180 mg total) by mouth daily. 09/14/13   Yvonne R Lowne [name], DO  fluticasone (FLONASE) 50 MCG/ACT nasal spray PLACE 2 SPRAYS INTO BOTH NOSTRILS DAILY. 11/22/15   Alferd Apa Lowne [name], DO  Fluticasone-Salmeterol (ADVAIR DISKUS) 250-50 MCG/DOSE AEPB Inhale 1 puff into the lungs 2 (two) times daily. 09/12/15   Rosalita Chessman [name], DO  glucose blood (FREESTYLE LITE) test strip Check blood sugar once daily 06/08/14   Alferd Apa Lowne [name], DO  lidocaine (LIDODERM) 5 % APPLY 1 PATCH ONTO SKIN EVERY 12 HOURS REMOVE AND DISCARD PATCH WITHIN 12 HOURS OR AS DIRECTED BY MD 07/07/15   Rosalita Chessman [name], DO  meclizine (ANTIVERT) 32 MG tablet Take 1 tablet (32 mg total) by mouth 3 (three) times daily as needed for dizziness. 11/02/15   Gareth Morgan, MD  NONFORMULARY OR COMPOUNDED ITEM Compression stockings 20-30 mmhg,  Edema #1 09/02/13   Rosalita Chessman [name], DO  valACYclovir (VALTREX) 1000 MG tablet TAKE 2 TABLETS BY MOUTH TWICE A DAY FOR 1 DAY AND AS DIRECTED 09/02/13   Alferd Apa Lowne [name], DO   BP 121/98 mmHg  Pulse 72  Temp(Src) 98.6 F (37 C) (Oral)  Resp 18  Ht 5' 5" (1.651 m)  Wt 221 lb (100.245 kg)  BMI 36.78 kg/m2  SpO2 100% Physical Exam  Constitutional: She is oriented to person, place, and time. She appears well-developed and well-nourished.  HENT:  Head: Normocephalic and atraumatic.  Right Ear: External ear normal.  Left Ear: External ear normal.  Nose: Nose normal.  Eyes: Right eye exhibits no discharge. Left  eye exhibits no discharge.  Cardiovascular: Normal rate, regular rhythm and normal heart sounds.   Pulmonary/Chest: Effort normal and breath sounds normal.  Abdominal: Soft. There is tenderness in the right lower quadrant, suprapubic area and left lower quadrant. There is guarding.  Neurological: She is alert and oriented to person, place, and time.  Skin: Skin is warm and dry.  Nursing note and vitals reviewed.   ED Course  Procedures (including critical care time) Labs Review Labs Reviewed  URINALYSIS, [facility] (NOT AT [facility])  COMPREHENSIVE  METABOLIC PANEL  LIPASE, BLOOD  CBC WITH DIFFERENTIAL/PLATELET    Imaging Review Ct Abdomen Pelvis W Contrast  12/07/2015  CLINICAL DATA:  55 year old female with abdominal pain and nausea for 4 days. Initial encounter. EXAM: CT ABDOMEN AND PELVIS WITH CONTRAST TECHNIQUE: Multidetector CT imaging of the abdomen and pelvis was performed using the standard protocol following bolus administration of intravenous contrast. CONTRAST:  139m ISOVUE-300 IOPAMIDOL (ISOVUE-300) INJECTION 61% COMPARISON:  Lumbar MRI 12/17/2011 FINDINGS: Borderline to mild cardiomegaly. No pericardial effusion. No pleural effusion. Negative lung bases. Chronic L5-S1 disc and endplate degeneration. Degenerative changes at the pubic symphysis. No acute osseous abnormality identified. Trace pelvic free fluid. The uterus is surgically absent. The at adnexa are diminutive or absent. Decompressed rectum. Unremarkable urinary bladder. Small pelvic phleboliths. Inflamed sigmoid colon with wall thickening and mesenteric stranding in an area of diverticulosis. Wall thickening up to 12 mm. A segment of 7-8 cm of the bowel is inflamed. There may be a trace amount of mesenteric gas on series 2, image 60. There is no associated fluid collection. Diverticulosis continues throughout the more proximal colon with no other areas of active inflammation identified. Negative appendix and  terminal ileum. Oral contrast has not yet reached the TI. No dilated or inflamed small bowel. No free abdominal air. No abdominal free fluid. Negative stomach. Hepatic steatosis, otherwise negative liver. Gallbladder, spleen, pancreas and adrenal glands are within normal limits. Portal venous system is patent. Major arterial structures are patent. Minimal calcified iliac atherosclerosis. No lymphadenopathy. Bilateral renal enhancement and contrast excretion is normal. IMPRESSION: 1. Acute sigmoid colitis in a 7-8 cm segment which is likely secondary to acute diverticulitis. Possible micro-perforation (series 2, image 60), but no free intraperitoneal air, drainable collection, or abscess. There is a small volume of associated pelvic free fluid. 2. Widespread colonic diverticulosis elsewhere with no other areas of active inflammation. 3. Hepatic steatosis. Electronically Signed   By: HGenevie AnnM.D.   On: 12/07/2015 13:43   I have personally reviewed and evaluated these images and lab results as part of my medical decision-making.   EKG Interpretation None      MDM   Final diagnoses:  Acute diverticulitis    Patient CT scan shows acute sigmoid colitis with a 7-8 centimeters segment. Possible microperforation but no abscess or free air. Her pain is poorly controlled despite IV narcotics. While she does not have peritonitis or a surgical abdomen think with such poor pain control she would benefit from continued IV pain medicines and IV antibiotics. She has a Flagyl allergy and thus will be given Zosyn. [facility]. EWaldron Labsaccepts in transfer for obs admission.    SSherwood Gambler MD 12/07/15 [phone]

## 2015-12-07 NOTE — Progress Notes (Signed)
Pre visit review using our clinic review tool, if applicable. No additional management support is needed unless otherwise documented below in the visit note. 

## 2015-12-07 NOTE — ED Notes (Signed)
Abd pain since Saturday-worse since last night-sent from PCP office in building-pt grimacing-slow steady gait

## 2015-12-07 NOTE — ED Notes (Signed)
Attempt to  call report , nurse not available 

## 2015-12-07 NOTE — Progress Notes (Signed)
Subjective:    Patient ID: Emily Osborne, female    DOB: July 30, 1961, 55 y.o.   MRN: [id number]  HPI  Pt in with some abdomen pain. This started on Saturday evening. Pt ate some macaroni and cheese and collared greens. Then Sunday after eating Easter dinner had some dull pain and felt bloated. Monday pain started to increase. No belching. Pt has been taking dexilant. Hx of reflux. Pt is having normal bowel movements. Some pain when passing stools. After eating lasagna last night pain has increased. Coffee this am increased pain. This am when woke pain is now severe. Causing her to cry. Pt has history hysterectomy.   No back pain.  Pt has recent colosoncopy and report states pan diverticultis.   Pt pain level 8/10 at least. On way over here worse and was crying.  Review of Systems  Constitutional: Negative for fever, chills and fatigue.  Respiratory: Negative for cough, chest tightness, shortness of breath and wheezing.   Cardiovascular: Negative for chest pain and palpitations.  Gastrointestinal: Positive for abdominal pain and abdominal distention. Negative for nausea, diarrhea, constipation, blood in stool, anal bleeding and rectal pain.  Neurological: Negative for dizziness and headaches.  Hematological: Negative for adenopathy.   Past Medical History  Diagnosis Date  . Asthma   . Fibromyalgia   . IBS (irritable bowel syndrome)   . GERD (gastroesophageal reflux disease)   . Hyperlipidemia   . Hypertension   . Personal history of other diseases of digestive system   . Other acute reactions to stress   . Pain in joint, upper arm   . Pain in joint, shoulder region   . Elevated blood pressure reading without diagnosis of hypertension   . Edema   . Pain in limb   . Herpes zoster without mention of complication   . Nontraumatic rupture of Achilles tendon   . Ganglion of joint     right wrist     Social History   Social History  . Marital Status: Divorced    Spouse  Name: N/A  . Number of Children: 2  . Years of Education: N/A   Occupational History  . Nurse/teacher    Social History Main Topics  . Smoking status: Never Smoker   . Smokeless tobacco: Never Used  . Alcohol Use: No  . Drug Use: No  . Sexual Activity:    Partners: Male   Other Topics Concern  . Not on file   Social History Narrative   Lives with alone.  She has two daughter.   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 Abdominal hysterectomy    . Tubal ligation    . Bunionectomy Bilateral     Bunionectomy 1983  . Achilles tendon repair Right 2005  . Ganglion cyst excision Right     wrist  . Meniscus repair Right 04/2014    Family History  Problem Relation Age of Onset  . Coronary artery disease Brother   . Hyperlipidemia Mother   . Hypertension Father   . Diabetes Father   . Heart disease Father   . Kidney disease Father     Died, 59  . Leukemia Brother   . Cancer Brother     myleoblastic anemia  . Hypertension Mother     Died, 60  . Thyroid disease Mother     Thyroid surgery  .  Pernicious anemia Sister   . Thyroid disease Sister     On thyroid Rx  . Lupus Daughter   . Hypertension Brother     Allergies  Allergen Reactions  . Gentamicin     Eye drops turned the sclera bright red  . Aspirin     REACTION: anaphylaxis  . Doxycycline     REACTION: severe nausea/vomiting  . Ibuprofen     REACTION: anaphylaxsis  . Influenza A (H1n1) Monoval Vac     REACTION: sick for 3 weeks  . Metronidazole     REACTION: red face/swelling  . Nsaids     REACTION: anaphylaxis  . Ranitidine Hcl     REACTION: Lips re/peel  . Sulfamethoxazole-Trimethoprim     REACTION: face red/peel  . Tramadol Hcl     REACTION: paranoid    Current Outpatient Prescriptions on File Prior to Visit  Medication Sig Dispense Refill  . acetaminophen (TYLENOL) 500 MG tablet Take 500 mg  by mouth every 6 (six) hours as needed.      . ALAWAY 0.025 % ophthalmic solution PLACE 1 DROP IN BOTH EYES TWICE A DAY 10 mL 0  . albuterol (PROAIR HFA) 108 (90 Base) MCG/ACT inhaler USE 2 PUFFS EVERY 4 HOURS AS NEEDED FOR COUGH, WHEEZE OR SHORTNESS OF BREATH. 8.5 each 2  . ALPRAZolam (XANAX) 0.25 MG tablet TAKE 1 TABLET BY MOUTH 3 TIMES A DAY AS NEEDED 30 tablet 0  . Blood Glucose Monitoring Suppl (FREESTYLE FREEDOM) KIT 1 Device by Does not apply route daily. 1 each 0  . Calcium Citrate-Vitamin D3 1000-400 LIQD 1 po qd (Patient taking differently: Take 1 tablet by mouth daily. 1 po qd) 480 mL 5  . cholecalciferol (VITAMIN D) 1000 units tablet Take 2,000 Units by mouth daily.    . Cyanocobalamin (B-12) 1000 MCG SUBL 1 sl qd (Patient taking differently: 1 tablet. 1 sl qd) 90 each 3  . Dexlansoprazole 30 MG capsule Take 1 capsule (30 mg total) by mouth daily. 90 capsule 3  . diclofenac sodium (VOLTAREN) 1 % GEL Apply 2 g topically daily as needed. For pain    . fexofenadine (ALLEGRA) 180 MG tablet Take 1 tablet (180 mg total) by mouth daily. 90 tablet 3  . fluticasone (FLONASE) 50 MCG/ACT nasal spray PLACE 2 SPRAYS INTO BOTH NOSTRILS DAILY. 16 g 4  . Fluticasone-Salmeterol (ADVAIR DISKUS) 250-50 MCG/DOSE AEPB Inhale 1 puff into the lungs 2 (two) times daily. 60 each 2  . glucose blood (FREESTYLE LITE) test strip Check blood sugar once daily 100 each 2  . lidocaine (LIDODERM) 5 % APPLY 1 PATCH ONTO SKIN EVERY 12 HOURS REMOVE AND DISCARD PATCH WITHIN 12 HOURS OR AS DIRECTED BY MD 30 patch 1  . meclizine (ANTIVERT) 32 MG tablet Take 1 tablet (32 mg total) by mouth 3 (three) times daily as needed for dizziness. 30 tablet 0  . NONFORMULARY OR COMPOUNDED ITEM Compression stockings 20-30 mmhg,  Edema #1 1 each 1  . valACYclovir (VALTREX) 1000 MG tablet TAKE 2 TABLETS BY MOUTH TWICE A DAY FOR 1 DAY AND AS DIRECTED 30 tablet 5   No current facility-administered medications on file prior to visit.    BP  118/78 mmHg  Pulse 87  Temp(Src) 98.1 F (36.7 C) (Oral)  Ht 5\' 5"  (1.651 m)  Wt 221 lb 6.4 oz (100.426 kg)  BMI 36.84 kg/m2  SpO2 98%       Objective:   Physical Exam  General Appearance- Not  in acute distress.  HEENT Eyes- Scleraeral/Conjuntiva-bilat- Not Yellow. Mouth & Throat- Normal.  Chest and Lung Exam Auscultation: Breath sounds:-Normal. Adventitious sounds:- No Adventitious sounds.  Cardiovascular Auscultation:Rythm - Regular. Heart Sounds -Normal heart sounds.  Abdomen Inspection:-Inspection Normal.  Palpation/Perucssion: Palpation and Percussion of the abdomen reveal- moderate- severe diffuse Tenderness,faint  Rebound tenderness, No rigidity(Guarding) and No Palpable abdominal masses.  Liver:-Normal.  Spleen:- Normal.   Back- no cva tenderness.      Assessment & Plan:  With your high  level of pain and exam finding I do think it is best for you to be evaluated in the ED now. I have notified the ED physician of your presentation.   We will give you GI cocktail in our office now. But stil proceed to the ED.   Follow up as ED physician recommends.  I think for benefit of pt ED stat studies done as opposed to our lab.  Azya Barbero, Ramon Dredge, PA-C

## 2015-12-07 NOTE — Progress Notes (Signed)
Pharmacy Antibiotic Note  Emily Osborne is a 55 y.o. female admitted on 12/07/2015 with intra-abdominal infection.  Pharmacy has been consulted for Unasyn dosing. AF, wbc wnl, CrCl~90-100. Received Zosyn x 1 dose at 1415.  Plan: Unasyn 3g IV q6h Monitor clinical progress, c/s, renal function, abx plan/LOT  Height: 5\' 5"  (165.1 cm) Weight: 221 lb (100.245 kg) IBW/kg (Calculated) : 57  Temp (24hrs), Avg:98.4 F (36.9 C), Min:98.1 F (36.7 C), Max:98.6 F (37 C)   Recent Labs Lab 12/07/15 1220  WBC 8.2  CREATININE 0.76    Estimated Creatinine Clearance: 93.2 mL/min (by C-G formula based on Cr of 0.76).    Allergies  Allergen Reactions  . Gentamicin     Eye drops turned the sclera bright red  . Aspirin     REACTION: anaphylaxis  . Doxycycline     REACTION: severe nausea/vomiting  . Ibuprofen     REACTION: anaphylaxsis  . Influenza A (H1n1) Monoval Vac     REACTION: sick for 3 weeks  . Metronidazole     REACTION: red face/swelling  . Nsaids     REACTION: anaphylaxis  . Ranitidine Hcl     REACTION: Lips re/peel  . Sulfamethoxazole-Trimethoprim     REACTION: face red/peel  . Tramadol Hcl     REACTION: paranoid    Antimicrobials this admission: 4/19 zosyn x1 4/19 unasyn >>   Dose adjustments this admission: n/a  Microbiology results:   Elicia Lamp, PharmD, [facility] Clinical Pharmacist Pager [phone] 12/07/2015 5:59 PM

## 2015-12-07 NOTE — H&P (Signed)
TRH H&P   Patient Demographics:    Emily Osborne, is a 55 y.o. female  MRN: [id number]   DOB - 09-17-1960  Admit Date - 12/07/2015  Outpatient Primary MD for the patient is [name], DO  Referring MD/NP/PA: Dr Regenia Skeeter  Outpatient Specialists: Dr. Havery Moros gastroenterologist   Patient coming from: [facility] ER  Chief Complaint  Patient presents with  . Abdominal Pain      HPI:    Emily Osborne  is a 55 y.o. female, With history of asthma, IBS, fibromyalgia, GERD, dyslipidemia who was in her usual state of health until about 5 days ago when she started developing some vague abdominal pain and distention, denies any diarrhea, denies any nausea vomiting, no blood or mucus in stool, no exposure to sick contacts or had food material that she can recall, she continued to have lower abdominal aches and pains which is more generalized, nonradiating, no aggravating or relieving factor, ongoing for 4-5 days, no associated symptoms.  She presented to [facility] [place] ER for above dictated symptoms, she was afebrile, her initial lab work was unremarkable, she underwent a CT scan of abdomen and pelvis which showed diverticulitis with a contained microperforation, no free air or abscess. She was then transferred to [facility] for further care. Patient is afebrile, nontoxic in appearance, has benign abdominal exam with only minimal tenderness.  During her review of systems patient says that she has been having some lightheaded episodes for the last 2-3 weeks for which she has been placed on meclizine, she also underwent outpatient carotid artery ultrasound which was unremarkable. 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 No new medication to cause orthostasis.    Review of systems:    In addition to the HPI above,   No Fever-chills, No Headache, No changes with Vision or hearing, No problems swallowing food or Liquids, No Chest pain, Cough or Shortness of Breath, +ve Abdominal pain above, No Nausea or Vommitting, Bowel movements are regular, No Blood in stool or Urine, No dysuria, No new skin rashes or bruises, No new joints pains-aches,  No new weakness, tingling, numbness in any extremity, No recent weight gain or loss, No polyuria, polydypsia or polyphagia, No  significant Mental Stressors.  A full 10 point Review of Systems was done, except as stated above, all other Review of Systems were negative.   With Past History of the following :    Past Medical History  Diagnosis Date  . Asthma   . Fibromyalgia   . IBS (irritable bowel syndrome)   . GERD (gastroesophageal reflux disease)   . Hyperlipidemia   . Personal history of other diseases of digestive system   . Other acute reactions to stress   . Pain in joint, upper arm   . Pain in joint, shoulder region   . Elevated blood pressure reading without diagnosis of hypertension     "just elevated when I'm in pain" (12/07/2015)  . Edema   . Pain in limb   . Herpes zoster without mention of complication   . Nontraumatic rupture of Achilles tendon   . Ganglion of joint     right wrist      Past Surgical History  Procedure Laterality Date  . Abdominal hysterectomy    . Tubal ligation    . Bunionectomy Bilateral     Bunionectomy 1983  . Achilles tendon repair Right 2005  . Ganglion cyst excision Right     wrist  . Meniscus repair Right 04/2014      Social History:     Social History  Substance Use Topics  . Smoking status: Never Smoker   . Smokeless tobacco: Never Used  . Alcohol Use: No     Lives - at  home, works as a Therapist, sports, mobile & active      Family History :     Family History  Problem Relation Age of Onset  . Coronary artery disease Brother   . Hyperlipidemia Mother   . Hypertension Father   . Diabetes Father   . Heart disease Father   . Kidney disease Father     Died, 51  . Leukemia Brother   . Cancer Brother     myleoblastic anemia  . Hypertension Mother     Died, 11  . Thyroid disease Mother     Thyroid surgery  . Pernicious anemia Sister   . Thyroid disease Sister     On thyroid Rx  . Lupus Daughter   . Hypertension Brother        Home Medications:   Prior to Admission medications   Medication Sig Start Date End Date Taking? Authorizing Provider  acetaminophen (TYLENOL) 500 MG tablet Take 500 mg by mouth every 6 (six) hours as needed.      Historical Provider, MD  ALAWAY 0.025 % ophthalmic solution PLACE 1 DROP IN BOTH EYES TWICE A DAY 11/12/15   Brunetta Jeans, PA-C  albuterol [facility] HFA) 108 (90 Base) MCG/ACT inhaler USE 2 PUFFS EVERY 4 HOURS AS NEEDED FOR COUGH, WHEEZE OR SHORTNESS OF BREATH. 09/12/15   Rosalita Chessman [name], DO  ALPRAZolam (XANAX) 0.25 MG tablet TAKE 1 TABLET BY MOUTH 3 TIMES A DAY AS NEEDED 08/11/15   Rosalita Chessman [name], DO  Blood Glucose Monitoring Suppl (FREESTYLE FREEDOM) KIT 1 Device by Does not apply route daily. 06/08/14   Rosalita Chessman [name], DO  Calcium Citrate-Vitamin D3 1000-400 LIQD 1 po qd Patient taking differently: Take 1 tablet by mouth daily. 1 po qd 09/14/13   Rosalita Chessman [name], DO  cholecalciferol (VITAMIN D) 1000 units tablet Take 2,000 Units by mouth daily.    Historical Provider, MD  Cyanocobalamin (B-12) 1000 MCG  SUBL 1 sl qd Patient taking differently: 1 tablet. 1 sl qd 09/14/13   Alferd Apa Lowne [name], DO  Dexlansoprazole 30 MG capsule Take 1 capsule (30 mg total) by mouth daily. 09/09/15   Jerene Bears, MD  diclofenac sodium (VOLTAREN) 1 % GEL Apply 2 g topically daily as needed. For pain    Historical Provider,  MD  fexofenadine (ALLEGRA) 180 MG tablet Take 1 tablet (180 mg total) by mouth daily. 09/14/13   Yvonne R Lowne [name], DO  fluticasone (FLONASE) 50 MCG/ACT nasal spray PLACE 2 SPRAYS INTO BOTH NOSTRILS DAILY. 11/22/15   Alferd Apa Lowne [name], DO  Fluticasone-Salmeterol (ADVAIR DISKUS) 250-50 MCG/DOSE AEPB Inhale 1 puff into the lungs 2 (two) times daily. 09/12/15   Rosalita Chessman [name], DO  glucose blood (FREESTYLE LITE) test strip Check blood sugar once daily 06/08/14   Alferd Apa Lowne [name], DO  lidocaine (LIDODERM) 5 % APPLY 1 PATCH ONTO SKIN EVERY 12 HOURS REMOVE AND DISCARD PATCH WITHIN 12 HOURS OR AS DIRECTED BY MD 07/07/15   Rosalita Chessman [name], DO  meclizine (ANTIVERT) 32 MG tablet Take 1 tablet (32 mg total) by mouth 3 (three) times daily as needed for dizziness. 11/02/15   Gareth Morgan, MD  NONFORMULARY OR COMPOUNDED ITEM Compression stockings 20-30 mmhg,  Edema #1 09/02/13   Rosalita Chessman [name], DO  valACYclovir (VALTREX) 1000 MG tablet TAKE 2 TABLETS BY MOUTH TWICE A DAY FOR 1 DAY AND AS DIRECTED 09/02/13   [name], DO     Allergies:     Allergies  Allergen Reactions  . Gentamicin     Eye drops turned the sclera bright red  . Aspirin     REACTION: anaphylaxis  . Doxycycline     REACTION: severe nausea/vomiting  . Ibuprofen     REACTION: anaphylaxsis  . Influenza A (H1n1) Monoval Vac     REACTION: sick for 3 weeks  . Metronidazole     REACTION: red face/swelling  . Nsaids     REACTION: anaphylaxis  . Ranitidine Hcl     REACTION: Lips re/peel  . Sulfamethoxazole-Trimethoprim     REACTION: face red/peel  . Tramadol Hcl     REACTION: paranoid     Physical Exam:   Vitals  Blood pressure 106/70, pulse 72, temperature 98.5 F (36.9 C), temperature source Oral, resp. rate 13, height 5' 5" (1.651 m), weight 100.245 kg (221 lb), SpO2 96 %.   1. General middle aged [place] female lying in bed in NAD,    2. Normal affect and insight, Not Suicidal or Homicidal,  Awake Alert, Oriented X 3.  3. No F.N deficits, ALL C.Nerves Intact, Strength 5/5 all 4 extremities, Sensation intact all 4 extremities, Plantars down going.  4. Ears and Eyes appear Normal, Conjunctivae clear, PERRLA. Moist Oral Mucosa.  5. Supple Neck, No JVD, No cervical lymphadenopathy appriciated, No Carotid Bruits.  6. Symmetrical Chest wall movement, Good air movement bilaterally, CTAB.  7. RRR, No Gallops, Rubs or Murmurs, No Parasternal Heave.  8. Positive Bowel Sounds, Abdomen with mild lower tenderness, No organomegaly appriciated,No rebound -guarding or rigidity.  9.  No Cyanosis, Normal Skin Turgor, No Skin Rash or Bruise.  10. Good muscle tone,  joints appear normal , no effusions, Normal ROM.  11. No Palpable Lymph Nodes in Neck or Axillae      Data Review:    CBC  Recent Labs Lab 12/07/15 1220  WBC 8.2  HGB 13.0  HCT 38.2  PLT 246  MCV 84.7  MCH 28.8  MCHC 34.0  RDW 13.2  LYMPHSABS 2.4  MONOABS 0.7  EOSABS 0.2  BASOSABS 0.0   ------------------------------------------------------------------------------------------------------------------  Chemistries   Recent Labs Lab 12/07/15 1220  NA 142  K 3.8  CL 108  CO2 28  GLUCOSE 95  BUN 10  CREATININE 0.76  CALCIUM 9.3  AST 25  ALT 28  ALKPHOS 62  BILITOT 0.6   ------------------------------------------------------------------------------------------------------------------ estimated creatinine clearance is 93.2 mL/min (by C-G formula based on Cr of 0.76). ------------------------------------------------------------------------------------------------------------------ No results for input(s): TSH, T4TOTAL, T3FREE, THYROIDAB in the last 72 hours.  Invalid input(s): FREET3  Coagulation profile No results for input(s): INR, PROTIME in the last 168 hours. ------------------------------------------------------------------------------------------------------------------- No results for  input(s): DDIMER in the last 72 hours. -------------------------------------------------------------------------------------------------------------------  Cardiac Enzymes No results for input(s): CKMB, TROPONINI, MYOGLOBIN in the last 168 hours.  Invalid input(s): CK ------------------------------------------------------------------------------------------------------------------ No results found for: BNP   ---------------------------------------------------------------------------------------------------------------  Urinalysis    Component Value Date/Time   COLORURINE YELLOW 12/07/2015 [place] 12/07/2015 1145   LABSPEC 1.011 12/07/2015 1145   PHURINE 6.0 12/07/2015 1145   GLUCOSEU NEGATIVE 12/07/2015 1145   HGBUR NEGATIVE 12/07/2015 1145   HGBUR negative 09/07/2010 0838   BILIRUBINUR NEGATIVE 12/07/2015 1145   BILIRUBINUR neg 09/07/2014 1109   KETONESUR NEGATIVE 12/07/2015 1145   PROTEINUR NEGATIVE 12/07/2015 1145   PROTEINUR ne 09/07/2014 1109   UROBILINOGEN negative 09/07/2014 1109   UROBILINOGEN negative 09/07/2010 0838   NITRITE NEGATIVE 12/07/2015 1145   NITRITE neg 09/07/2014 1109   LEUKOCYTESUR NEGATIVE 12/07/2015 1145    ----------------------------------------------------------------------------------------------------------------   Imaging Results:    Ct Abdomen Pelvis W Contrast  12/07/2015  CLINICAL DATA:  54 year old female with abdominal pain and nausea for 4 days. Initial encounter. EXAM: CT ABDOMEN AND PELVIS WITH CONTRAST TECHNIQUE: Multidetector CT imaging of the abdomen and pelvis was performed using the standard protocol following bolus administration of intravenous contrast. CONTRAST:  128m ISOVUE-300 IOPAMIDOL (ISOVUE-300) INJECTION 61% COMPARISON:  Lumbar MRI 12/17/2011 FINDINGS: Borderline to mild cardiomegaly. No pericardial effusion. No pleural effusion. Negative lung bases. Chronic L5-S1 disc and endplate degeneration.  Degenerative changes at the pubic symphysis. No acute osseous abnormality identified. Trace pelvic free fluid. The uterus is surgically absent. The at adnexa are diminutive or absent. Decompressed rectum. Unremarkable urinary bladder. Small pelvic phleboliths. Inflamed sigmoid colon with wall thickening and mesenteric stranding in an area of diverticulosis. Wall thickening up to 12 mm. A segment of 7-8 cm of the bowel is inflamed. There may be a trace amount of mesenteric gas on series 2, image 60. There is no associated fluid collection. Diverticulosis continues throughout the more proximal colon with no other areas of active inflammation identified. Negative appendix and terminal ileum. Oral contrast has not yet reached the TI. No dilated or inflamed small bowel. No free abdominal air. No abdominal free fluid. Negative stomach. Hepatic steatosis, otherwise negative liver. Gallbladder, spleen, pancreas and adrenal glands are within normal limits. Portal venous system is patent. Major arterial structures are patent. Minimal calcified iliac atherosclerosis. No lymphadenopathy. Bilateral renal enhancement and contrast excretion is normal. IMPRESSION: 1. Acute sigmoid colitis in a 7-8 cm segment which is likely secondary to acute diverticulitis. Possible micro-perforation (series 2, image 60), but no free intraperitoneal air, drainable collection, or abscess. There is a small volume of associated pelvic free fluid. 2. Widespread colonic diverticulosis elsewhere with no other areas of active inflammation. 3. Hepatic steatosis. Electronically Signed  By: Genevie Ann M.D.   On: 12/07/2015 13:43    Baseline EKG ordered   Assessment & Plan:     1. Acute diverticulitis with contained microperforation. No evidence of free air or abscess, case discussed with GI physician on-call doctor Carlean Purl, patient will be admitted with bowel rest, IV fluids, IV Unasyn, if stable diet can be advanced gradually over the next 24 hours.  No inpatient GI consultation needed unless she declines, outpatient follow-up with Dr. Havery Moros her primary gastroenterologist one to 2 weeks after discharge.  2. Presyncope. Ongoing for the last few weeks, will check baseline EKG, telemetry, echogram and orthostatics.  3. Asthma. Stable no wheezing, as needed when necessary nebulizer treatments.  4. Anxiety. Continue home dose Xanax.   DVT Prophylaxis Heparin    AM Labs Ordered, also please review Full Orders  Family Communication: Admission, patients condition and plan of care including tests being ordered have been discussed with the patient and daughters who indicate understanding and agree with the plan and Code Status.  Code Status Full  Likely DC to  Home 2-3 days  Condition Fair  Consults called: None   Admission status: Inpt tele   Time spent in minutes : 35   Lala Lund K M.D on 12/07/2015 at 6:23 PM  Between 7am to 7pm - Pager - [phone]. After 7pm go to [email or web address] - password [facility]  [facility] - Office  [phone]

## 2015-12-07 NOTE — Patient Instructions (Addendum)
With your high  level of pain and exam finding I do think it is best for you to be evaluated in the ED now. I have notified the ED physician of your presentation.   We will give you GI cocktail in our office now. But still proceed to the ED for further evaluation.   Follow up as ED physician recommends.

## 2015-12-08 ENCOUNTER — Inpatient Hospital Stay (HOSPITAL_COMMUNITY): Payer: BC Managed Care – PPO

## 2015-12-08 DIAGNOSIS — I509 Heart failure, unspecified: Secondary | ICD-10-CM

## 2015-12-08 LAB — BASIC METABOLIC PANEL
ANION GAP: 11 (ref 5–15)
BUN: 8 mg/dL (ref 6–20)
CO2: 24 mmol/L (ref 22–32)
Calcium: 8.8 mg/dL — ABNORMAL LOW (ref 8.9–10.3)
Chloride: 106 mmol/L (ref 101–111)
Creatinine, Ser: 0.81 mg/dL (ref 0.44–1.00)
GLUCOSE: 92 mg/dL (ref 65–99)
POTASSIUM: 3.9 mmol/L (ref 3.5–5.1)
Sodium: 141 mmol/L (ref 135–145)

## 2015-12-08 LAB — CBC
HEMATOCRIT: 36.1 % (ref 36.0–46.0)
HEMOGLOBIN: 11.7 g/dL — AB (ref 12.0–15.0)
MCH: 27.5 pg (ref 26.0–34.0)
MCHC: 32.4 g/dL (ref 30.0–36.0)
MCV: 84.7 fL (ref 78.0–100.0)
Platelets: 216 10*3/uL (ref 150–400)
RBC: 4.26 MIL/uL (ref 3.87–5.11)
RDW: 13.5 % (ref 11.5–15.5)
WBC: 9.6 10*3/uL (ref 4.0–10.5)

## 2015-12-08 LAB — ECHOCARDIOGRAM COMPLETE
HEIGHTINCHES: 65 in
Weight: 3587.33 oz

## 2015-12-08 LAB — T4, FREE: FREE T4: 1.34 ng/dL — AB (ref 0.61–1.12)

## 2015-12-08 MED ORDER — ALBUTEROL SULFATE (2.5 MG/3ML) 0.083% IN NEBU: 2.5000 mg | INHALATION_SOLUTION | Freq: Four times a day (QID) | RESPIRATORY_TRACT | Status: DC | PRN

## 2015-12-08 MED ORDER — CETYLPYRIDINIUM CHLORIDE 0.05 % MT LIQD
7.0000 mL | Freq: Two times a day (BID) | OROMUCOSAL | Status: DC
  Administered 2015-12-08: 7 mL via OROMUCOSAL

## 2015-12-08 MED ORDER — SODIUM CHLORIDE 0.9 % IV SOLN
INTRAVENOUS | Status: AC
  Administered 2015-12-08 (×2): via INTRAVENOUS

## 2015-12-08 NOTE — Progress Notes (Signed)
  Echocardiogram 2D Echocardiogram has been performed.  Diamond Nickel 12/08/2015, 12:52 PM

## 2015-12-08 NOTE — Progress Notes (Signed)
Pt tolerating clear fluids without difficulty

## 2015-12-08 NOTE — Progress Notes (Signed)
PROGRESS NOTE                                                                                                                                                                                                             Patient Demographics:    Emily Osborne, is a 55 y.o. female, DOB - 02-Dec-1960, [id number]  Admit date - 12/07/2015   Admitting Physician Starleen Arms, MD  Outpatient Primary MD for the patient is [name], DO  LOS - 1  Outpatient Specialists: Dr Huntley Estelle - GI  Chief Complaint  Patient presents with  . Abdominal Pain       Brief Narrative  Emily Osborne is a 55 y.o. female, With history of asthma, IBS, fibromyalgia, GERD, dyslipidemia who was in her usual state of health until about 5 days ago when she started developing some vague abdominal pain and distention, denies any diarrhea, denies any nausea vomiting, no blood or mucus in stool, no exposure to sick contacts or had food material that she can recall, she continued to have lower abdominal aches and pains which is more generalized, nonradiating, no aggravating or relieving factor, ongoing for 4-5 days, no associated symptoms.  She presented to [facility] for above dictated symptoms, she was afebrile, her initial lab work was unremarkable, she underwent a CT scan of abdomen and pelvis which showed diverticulitis with a contained microperforation, no free air or abscess. She was then transferred to [facility] for further care. Patient is afebrile, nontoxic in appearance, has benign abdominal exam with only minimal tenderness.  During her review of systems patient says that she has been having some lightheaded episodes for the last 2-3 weeks for which she has been placed on meclizine, she also underwent outpatient carotid artery ultrasound which was unremarkable. She describes this sensation as lightheadedness and  not vertiginous, she is a [employer] fund[employer] by training, denies any headache or focal weakness. No new medication to cause orthostasis.   Subjective:    Emily Osborne today has, No headache, No chest pain, mild LLQ abdominal pain - No Nausea, No new weakness tingling or numbness, No Cough - SOB.     Assessment  & Plan :    1. Acute diverticulitis with contained microperforation. No evidence of free air or abscess, case discussed with GI physician  on-call doctor Leone Payor on 12-07-15, conservative Rx with bowel rest, IV fluids, IV Unasyn, if stable diet will be advanced gradually (will do clears today as she is better). No inpatient GI consultation needed unless she declines, outpatient follow-up with Dr. Adela Lank her primary gastroenterologist one to 2 weeks after discharge.  2. Presyncope. Ongoing for the last few weeks, stable baseline EKG, not orthostatic, pending echogram.  3. Asthma. Stable no wheezing, as needed when necessary nebulizer treatments.  4. Anxiety. Continue home dose Xanax.    Code Status : Full  Family Communication  : daughters on the day of admission  Disposition Plan  : Home 1-2 days  Barriers For Discharge : Colitis  Consults  :  None  Procedures  :   CT Abd Pelvis - colitis  TTE  DVT Prophylaxis  :   Heparin   Lab Results  Component Value Date   PLT 216 12/08/2015    Antibiotics  :     Anti-infectives    Start     Dose/Rate Route Frequency Ordered Stop   12/07/15 2200  valACYclovir (VALTREX) tablet 500 mg     500 mg Oral Every 12 hours 12/07/15 1750     12/07/15 2200  Ampicillin-Sulbactam (UNASYN) 3 g in sodium chloride 0.9 % 100 mL IVPB     3 g 100 mL/hr over 60 Minutes Intravenous Every 6 hours 12/07/15 1800     12/07/15 1415  piperacillin-tazobactam (ZOSYN) IVPB 3.375 g     3.375 g 100 mL/hr over 30 Minutes Intravenous  Once 12/07/15 1407 12/07/15 1532        Objective:   Filed Vitals:   12/07/15 1739 12/07/15 2152 12/08/15 0544 12/08/15 0846    BP: 106/70 127/77 121/70   Pulse: 72 82 95   Temp: 98.5 F (36.9 C) 98.4 F (36.9 C) 99.8 F (37.7 C)   TempSrc:  Oral Oral   Resp: 13 18 18    Height:      Weight:   101.7 kg (224 lb 3.3 oz)   SpO2: 96% 100% 95% 93%    Wt Readings from Last 3 Encounters:  12/08/15 101.7 kg (224 lb 3.3 oz)  12/07/15 100.426 kg (221 lb 6.4 oz)  11/28/15 101.237 kg (223 lb 3 oz)     Intake/Output Summary (Last 24 hours) at 12/08/15 0852 Last data filed at 12/08/15 5956  Gross per 24 hour  Intake 1423.33 ml  Output      0 ml  Net 1423.33 ml     Physical Exam  Awake Alert, Oriented X 3, No new F.N deficits, Normal affect [place].AT,PERRAL Supple Neck,No JVD, No cervical lymphadenopathy appriciated.  Symmetrical Chest wall movement, Good air movement bilaterally, CTAB RRR,No Gallops,Rubs or new Murmurs, No Parasternal Heave +ve B.Sounds, Abd Soft, mild lower tenderness + in LLQ, No organomegaly appriciated, No rebound - guarding or rigidity. No Cyanosis, Clubbing or edema, No new Rash or bruise       Data Review:    CBC  Recent Labs Lab 12/07/15 1220 12/07/15 1923 12/08/15 0519  WBC 8.2 8.4 9.6  HGB 13.0 12.3 11.7*  HCT 38.2 37.7 36.1  PLT 246 232 216  MCV 84.7 85.7 84.7  MCH 28.8 28.0 27.5  MCHC 34.0 32.6 32.4  RDW 13.2 13.4 13.5  LYMPHSABS 2.4  --   --   MONOABS 0.7  --   --   EOSABS 0.2  --   --   BASOSABS 0.0  --   --  Chemistries   Recent Labs Lab 12/07/15 1220 12/07/15 1923 12/08/15 0519  NA 142  --  141  K 3.8  --  3.9  CL 108  --  106  CO2 28  --  24  GLUCOSE 95  --  92  BUN 10  --  8  CREATININE 0.76 0.95 0.81  CALCIUM 9.3  --  8.8*  AST 25  --   --   ALT 28  --   --   ALKPHOS 62  --   --   BILITOT 0.6  --   --    ------------------------------------------------------------------------------------------------------------------ No results for input(s): CHOL, HDL, LDLCALC, TRIG, CHOLHDL, LDLDIRECT in the last 72 hours.  Lab Results  Component  Value Date   HGBA1C 6.1 03/06/2011   ------------------------------------------------------------------------------------------------------------------  Recent Labs  12/07/15 1923  TSH 0.021*   ------------------------------------------------------------------------------------------------------------------ No results for input(s): VITAMINB12, FOLATE, FERRITIN, TIBC, IRON, RETICCTPCT in the last 72 hours.  Coagulation profile No results for input(s): INR, PROTIME in the last 168 hours.  No results for input(s): DDIMER in the last 72 hours.  Cardiac Enzymes No results for input(s): CKMB, TROPONINI, MYOGLOBIN in the last 168 hours.  Invalid input(s): CK ------------------------------------------------------------------------------------------------------------------ No results found for: BNP  Inpatient Medications  Scheduled Meds: . ampicillin-sulbactam (UNASYN) IV  3 g Intravenous Q6H  . antiseptic oral rinse  7 mL Mouth Rinse BID  . cholecalciferol  2,000 Units Oral Daily  . docusate sodium  100 mg Oral Daily  . fluticasone  1 spray Each Nare Daily  . heparin  5,000 Units Subcutaneous Q8H  . ketotifen  2 drop Both Eyes BID  . lidocaine  1 patch Transdermal Q24H  . loratadine  10 mg Oral Daily  . mometasone-formoterol  2 puff Inhalation BID  . pantoprazole  40 mg Oral Daily  . valACYclovir  500 mg Oral Q12H   Continuous Infusions: . sodium chloride     PRN Meds:.ALPRAZolam, HYDROcodone-acetaminophen, meclizine, morphine injection, ondansetron **OR** ondansetron (ZOFRAN) IV  Micro Results No results found for this or any previous visit (from the past 240 hour(s)).  Radiology Reports Ct Abdomen Pelvis W Contrast  12/07/2015  CLINICAL DATA:  55 year old female with abdominal pain and nausea for 4 days. Initial encounter. EXAM: CT ABDOMEN AND PELVIS WITH CONTRAST TECHNIQUE: Multidetector CT imaging of the abdomen and pelvis was performed using the standard protocol  following bolus administration of intravenous contrast. CONTRAST:  ISOVUE-300 IOPAMIDOL (ISOVUE-300) INJECTION 61% COMPARISON:  Lumbar MRI 12/17/2011 FINDINGS: Borderline to mild cardiomegaly. No pericardial effusion. No pleural effusion. Negative lung bases. Chronic L5-S1 disc and endplate degeneration. Degenerative changes at the pubic symphysis. No acute osseous abnormality identified. Trace pelvic free fluid. The uterus is surgically absent. The at adnexa are diminutive or absent. Decompressed rectum. Unremarkable urinary bladder. Small pelvic phleboliths. Inflamed sigmoid colon with wall thickening and mesenteric stranding in an area of diverticulosis. Wall thickening up to 12 mm. A segment of 7-8 cm of the bowel is inflamed. There may be a trace amount of mesenteric gas on series 2, image 60. There is no associated fluid collection. Diverticulosis continues throughout the more proximal colon with no other areas of active inflammation identified. Negative appendix and terminal ileum. Oral contrast has not yet reached the TI. No dilated or inflamed small bowel. No free abdominal air. No abdominal free fluid. Negative stomach. Hepatic steatosis, otherwise negative liver. Gallbladder, spleen, pancreas and adrenal glands are within normal limits. Portal venous system is patent. Major arterial  structures are patent. Minimal calcified iliac atherosclerosis. No lymphadenopathy. Bilateral renal enhancement and contrast excretion is normal. IMPRESSION: 1. Acute sigmoid colitis in a 7-8 cm segment which is likely secondary to acute diverticulitis. Possible micro-perforation (series 2, image 60), but no free intraperitoneal air, drainable collection, or abscess. There is a small volume of associated pelvic free fluid. 2. Widespread colonic diverticulosis elsewhere with no other areas of active inflammation. 3. Hepatic steatosis. Electronically Signed   By: Odessa Fleming M.D.   On: 12/07/2015 13:43    Time Spent in  minutes  30   Marguarite Markov K M.D on 12/08/2015 at 8:52 AM  Between 7am to 7pm - Pager - [phone]  After 7pm go to [email or web address] - password [facility]  [facility] -  Office  [phone]

## 2015-12-08 NOTE — Care Management Note (Signed)
Case Management Note  Patient Details  Name: NECOL SYRETT MRN: [id number] Date of Birth: 1961-04-03  Subjective/Objective:                 Patient admitted with diverticulitis and colitis receiving IV Abx, still c/o some abd pain this morning when i spoke to her. She was glad to be advanced to clears. She is independent, lives at home with her children, denies any difficulties in self care, or needs for DME.    Action/Plan:  No CM needs at this time, will continue to follow.   Expected Discharge Date:                  Expected Discharge Plan:  Home/Self Care  In-House Referral:     Discharge planning Services  CM Consult  Post Acute Care Choice:  NA Choice offered to:     DME Arranged:    DME Agency:     HH Arranged:    HH Agency:     Status of Service:  In process, will continue to follow  Medicare Important Message Given:    Date Medicare IM Given:    Medicare IM give by:    Date Additional Medicare IM Given:    Additional Medicare Important Message give by:     If discussed at [place] of Stay Meetings, dates discussed:    Additional Comments:  Carles Collet, RN 12/08/2015, 12:25 PM

## 2015-12-09 ENCOUNTER — Encounter: Payer: BC Managed Care – PPO | Admitting: Family Medicine

## 2015-12-09 LAB — T3: T3, Total: 139 ng/dL (ref 71–180)

## 2015-12-09 LAB — HEMOGLOBIN A1C
Hgb A1c MFr Bld: 5.9 % — ABNORMAL HIGH (ref 4.8–5.6)
MEAN PLASMA GLUCOSE: 123 mg/dL

## 2015-12-09 MED ORDER — AMOXICILLIN-POT CLAVULANATE 875-125 MG PO TABS: 1.0000 | ORAL_TABLET | Freq: Two times a day (BID) | ORAL | Status: DC

## 2015-12-09 NOTE — Discharge Instructions (Signed)
Follow with Primary MD [name], DO in 7 days   Get CBC, CMP, 2 view Chest X ray checked  by Primary MD next visit.    Activity: As tolerated with Full fall precautions use walker/cane & assistance as needed   Disposition Home     Diet:  Soft die for 3-5 days then gradually advance to regular as tolerated.  For Heart failure patients - Check your Weight same time everyday, if you gain over 2 pounds, or you develop in leg swelling, experience more shortness of breath or chest pain, call your Primary MD immediately. Follow Cardiac Low Salt Diet and 1.5 lit/day fluid restriction.   On your next visit with your primary care physician please Get Medicines reviewed and adjusted.   Please request your Prim.MD to go over all Hospital Tests and Procedure/Radiological results at the follow up, please get all Hospital records sent to your Prim MD by signing hospital release before you go home.   If you experience worsening of your admission symptoms, develop shortness of breath, life threatening emergency, suicidal or homicidal thoughts you must seek medical attention immediately by calling 911 or calling your MD immediately  if symptoms less severe.  You Must read complete instructions/literature along with all the possible adverse reactions/side effects for all the Medicines you take and that have been prescribed to you. Take any new Medicines after you have completely understood and accpet all the possible adverse reactions/side effects.   Do not drive, operating heavy machinery, perform activities at heights, swimming or participation in water activities or provide baby sitting services if your were admitted for syncope or siezures until you have seen by Primary MD or a Neurologist and advised to do so again.  Do not drive when taking Pain medications.    Do not take more than prescribed Pain, Sleep and Anxiety Medications  Special Instructions: If you have smoked or chewed  Tobacco  in the last 2 yrs please stop smoking, stop any regular Alcohol  and or any Recreational drug use.  Wear Seat belts while driving.   Please note  You were cared for by a hospitalist during your hospital stay. If you have any questions about your discharge medications or the care you received while you were in the hospital after you are discharged, you can call the unit and asked to speak with the hospitalist on call if the hospitalist that took care of you is not available. Once you are discharged, your primary care physician will handle any further medical issues. Please note that NO REFILLS for any discharge medications will be authorized once you are discharged, as it is imperative that you return to your primary care physician (or establish a relationship with a primary care physician if you do not have one) for your aftercare needs so that they can reassess your need for medications and monitor your lab values.

## 2015-12-09 NOTE — Discharge Summary (Addendum)
Emily Osborne, is a 55 y.o. female  DOB [birth year]  MRN [id number].  Admission date:  12/07/2015  Admitting Physician  Albertine Patricia, MD  Discharge Date:  01/31/2016   Primary MD  [name], DO  Recommendations for primary care physician for things to follow:   Must follow with GI within a weak,  Repeat CBC BMP next visit.   Admission Diagnosis  Acute diverticulitis [K57.92]   Discharge Diagnosis  Acute diverticulitis [K57.92]    Active Problems:   Asthma   GERD   History of colonic polyps   Acute diverticulitis      Past Medical History  Diagnosis Date  . Asthma   . Fibromyalgia   . IBS (irritable bowel syndrome)   . GERD (gastroesophageal reflux disease)   . Hyperlipidemia   . Personal history of other diseases of digestive system   . Other acute reactions to stress   . Pain in joint, upper arm   . Pain in joint, shoulder region   . Elevated blood pressure reading without diagnosis of hypertension     "just elevated when I'm in pain" (12/07/2015)  . Edema   . Pain in limb   . Herpes zoster without mention of complication   . Nontraumatic rupture of Achilles tendon   . Ganglion of joint     right wrist    Past Surgical History  Procedure Laterality Date  . Abdominal hysterectomy    . Tubal ligation    . Bunionectomy Bilateral     Bunionectomy 1983  . Achilles tendon repair Right 2005  . Ganglion cyst excision Right     wrist  . Meniscus repair Right 04/2014       HPI  from the history and physical done on the day of admission:   Emily Osborne is a 55 y.o. female, With history of asthma, IBS, fibromyalgia, GERD, dyslipidemia who was in her usual state of health until about 5 days ago when she started developing some vague abdominal pain and distention, denies any  diarrhea, denies any nausea vomiting, no blood or mucus in stool, no exposure to sick contacts or had food material that she can recall, she continued to have lower abdominal aches and pains which is more generalized, nonradiating, no aggravating or relieving factor, ongoing for 4-5 days, no associated symptoms.  She presented to [facility] for above dictated symptoms, she was afebrile, her initial lab work was unremarkable, she underwent a CT scan of abdomen and pelvis which showed diverticulitis with a contained microperforation, no free air or abscess. She was then transferred to [facility] for further care. Patient is afebrile, nontoxic in appearance, has benign abdominal exam with only minimal tenderness.  During her review of systems patient says that she has been having some lightheaded episodes for the last 2-3 weeks for which she has been placed on meclizine, she also underwent outpatient carotid artery ultrasound which was unremarkable. She describes this sensation as lightheadedness and not  vertiginous, she is a Therapist, sports by training, denies any headache or focal weakness. No new medication to cause orthostasis.     Hospital Course:      1. Acute diverticulitis with contained microperforation. No evidence of free air or abscess, case discussed with GI physician on-call doctor Carlean Purl on 12-07-15, Much improved after conservative Rx with bowel rest, IV fluids, IV Unasyn, Completely symptom free this morning, has tolerated liquid diet > 24 hrs. [facility] GI MD Dr Carlean Purl no inpatient GI consultation needed unless she declines, outpatient follow-up with Dr. Havery Moros her primary gastroenterologist one to 2 weeks after discharge. She will be placed on 10 days of soft diet for the next 3-5 days and gently advance regular consistency as tolerated, advised to see PCP and her primary GI MD in 1 week.  2. Presyncope. Ongoing for the last few weeks, stable baseline EKG, not orthostatic, Stable  echogram. With preserved EF, chronic diastolic CHF with mild LVH. No acute  abnormality. If symptomatic again outpatient workup by PCP.  3. Asthma. Stable no wheezing, as needed when necessary nebulizer treatments.  4. Anxiety. Continue home dose Xanax.      Follow UP  Follow-up Information    Follow up with [name], DO. Schedule an appointment as soon as possible for a visit in 1 week.   Specialty:  Family Medicine   Contact information:   [address] [place] [address] [phone]       Follow up with Manus Gunning, MD. Schedule an appointment as soon as possible for a visit in 1 week.   Specialty:  Gastroenterology   Contact information:   [place] [address] [phone]        [place] over the phone  Discharge Condition: Stable  Diet and Activity recommendation: See Discharge Instructions below  Discharge Instructions           Discharge Instructions    Discharge instructions    Complete by:  As directed   Follow with Primary MD [name], DO in 7 days   Get CBC, CMP, 2 view Chest X ray checked  by Primary MD next visit.    Activity: As tolerated with Full fall precautions use walker/cane & assistance as needed   Disposition Home     Diet:  Soft die for 3-5 days then gradually advance to regular as tolerated.  For Heart failure patients - Check your Weight same time everyday, if you gain over 2 pounds, or you develop in leg swelling, experience more shortness of breath or chest pain, call your Primary MD immediately. Follow Cardiac Low Salt Diet and 1.5 lit/day fluid restriction.   On your next visit with your primary care physician please Get Medicines reviewed and adjusted.   Please request your Prim.MD to go over all Hospital Tests and Procedure/Radiological results at the follow up, please get all Hospital records sent to your Prim MD by signing  hospital release before you go home.   If you experience worsening of your admission symptoms, develop shortness of breath, life threatening emergency, suicidal or homicidal thoughts you must seek medical attention immediately by calling 911 or calling your MD immediately  if symptoms less severe.  You Must read complete instructions/literature along with all the possible adverse reactions/side effects for all the Medicines you take and that have been prescribed to you. Take any new Medicines after you have completely  understood and accpet all the possible adverse reactions/side effects.   Do not drive, operating heavy machinery, perform activities at heights, swimming or participation in water activities or provide baby sitting services if your were admitted for syncope or siezures until you have seen by Primary MD or a Neurologist and advised to do so again.  Do not drive when taking Pain medications.    Do not take more than prescribed Pain, Sleep and Anxiety Medications  Special Instructions: If you have smoked or chewed Tobacco  in the last 2 yrs please stop smoking, stop any regular Alcohol  and or any Recreational drug use.  Wear Seat belts while driving.   Please note  You were cared for by a hospitalist during your hospital stay. If you have any questions about your discharge medications or the care you received while you were in the hospital after you are discharged, you can call the unit and asked to speak with the hospitalist on call if the hospitalist that took care of you is not available. Once you are discharged, your primary care physician will handle any further medical issues. Please note that NO REFILLS for any discharge medications will be authorized once you are discharged, as it is imperative that you return to your primary care physician (or establish a relationship with a primary care physician if you do not have one) for your aftercare needs so that they can reassess  your need for medications and monitor your lab values.     Increase activity slowly    Complete by:  As directed              Discharge Medications       Medication List    STOP taking these medications        ALPRAZolam 0.25 MG tablet  Commonly known as:  XANAX     B-12 1000 MCG Subl     Dexlansoprazole 30 MG capsule     Fluticasone-Salmeterol 250-50 MCG/DOSE Aepb  Commonly known as:  ADVAIR DISKUS     glucose blood test strip  Commonly known as:  FREESTYLE LITE     lidocaine 5 %  Commonly known as:  LIDODERM      TAKE these medications        albuterol 108 (90 Base) MCG/ACT inhaler  Commonly known as:  PROAIR HFA  USE 2 PUFFS EVERY 4 HOURS AS NEEDED FOR COUGH, WHEEZE OR SHORTNESS OF BREATH.     Calcium Citrate-Vitamin D3 1000-400 Liqd  1 po qd     cholecalciferol 1000 units tablet  Commonly known as:  VITAMIN D  Take 2,000 Units by mouth daily.     fexofenadine 180 MG tablet  Commonly known as:  ALLEGRA  Take 1 tablet (180 mg total) by mouth daily.     fluticasone 50 MCG/ACT nasal spray  Commonly known as:  FLONASE  PLACE 2 SPRAYS INTO BOTH NOSTRILS DAILY.     FREESTYLE FREEDOM Kit  1 Device by Does not apply route daily.     I.L.X. B-12 Elix  Take 2 drops by mouth daily.     meclizine 32 MG tablet  Commonly known as:  ANTIVERT  Take 1 tablet (32 mg total) by mouth 3 (three) times daily as needed for dizziness.     NONFORMULARY OR COMPOUNDED ITEM  Compression stockings 20-30 mmhg,  Edema #1     TYLENOL 500 MG tablet  Generic drug:  acetaminophen  Take 1,000 mg by mouth every 6 (  six) hours as needed for moderate pain.     valACYclovir 1000 MG tablet  Commonly known as:  VALTREX  TAKE 2 TABLETS BY MOUTH TWICE A DAY FOR 1 DAY AND AS DIRECTED     VOLTAREN 1 % Gel  Generic drug:  diclofenac sodium  Apply 2 g topically daily as needed (pain). For pain        Major procedures and Radiology Reports - PLEASE review detailed and final reports  for all details, in brief -   TTE  Left ventricle: The cavity size was normal. Systolic function was normal. The estimated ejection fraction was in the range of 60% to 65%. Wall motion was normal; there were no regional wall motion abnormalities. Doppler parameters are consistent with abnormal left ventricular relaxation (grade 1 diastolic dysfunction). There was no evidence of elevated ventricular filling pressure by Doppler parameters. - Aortic valve: There was no regurgitation. - Aortic root: The aortic root was normal in size. - Mitral valve: Structurally normal valve. There was no regurgitation. - Left atrium: The atrium was normal in size. - Right ventricle: Systolic function was normal. - Right atrium: The atrium was normal in size. - Tricuspid valve: There was no regurgitation. - Pulmonic valve: There was no regurgitation. - Pulmonary arteries: Systolic pressure was within the normal range. - Inferior vena cava: The vessel was normal in size. - Pericardium, extracardiac: There was no pericardial effusion.     No results found.  Micro Results      No results found for this or any previous visit (from the past 240 hour(s)).     Today   Subjective    Emily Osborne today has no headache,no chest abdominal pain,no new weakness tingling or numbness, feels much better wants to go home today.    Objective   Blood pressure 116/75, pulse 82, temperature 98.4 F (36.9 C), temperature source Oral, resp. rate 20, height _0  (1.651 m), weight 102.1 kg (225 lb 1.4 oz), SpO2 98 %.  No intake or output data in the 24 hours ending 01/31/16 1411  Exam Awake Alert, Oriented x 3, No new F.N deficits, Normal affect [place].AT,PERRAL Supple Neck,No JVD, No cervical lymphadenopathy appriciated.  Symmetrical Chest wall movement, Good air movement bilaterally, CTAB RRR,No Gallops,Rubs or new Murmurs, No Parasternal Heave +ve B.Sounds, Abd Soft, Minimal LLQ tenderness, No organomegaly  appriciated, No rebound -guarding or rigidity. No Cyanosis, Clubbing or edema, No new Rash or bruise   Data Review   CBC w Diff:  Lab Results  Component Value Date   WBC 5.0 12/14/2015   HGB 12.9 12/14/2015   HCT 38.6 12/14/2015   PLT 330.0 12/14/2015   LYMPHOPCT 42.0 12/14/2015   MONOPCT 8.6 12/14/2015   EOSPCT 2.8 12/14/2015   BASOPCT 0.5 12/14/2015    CMP:  Lab Results  Component Value Date   NA 141 12/14/2015   K 4.1 12/14/2015   CL 106 12/14/2015   CO2 26 12/14/2015   BUN 12 12/14/2015   CREATININE 0.79 12/14/2015   PROT 7.2 12/14/2015   ALBUMIN 3.9 12/14/2015   BILITOT 0.4 12/14/2015   ALKPHOS 60 12/14/2015   AST 19 12/14/2015   ALT 23 12/14/2015  .   Total Time in preparing paper work, data evaluation and todays exam - 35 minutes  Thurnell Lose M.D on 01/31/2016 at 2:11 PM  [facility]   Office  [phone]

## 2015-12-09 NOTE — Progress Notes (Signed)
Pt refused ketotifen eye drop and heparin, will continue to monitor

## 2015-12-09 NOTE — Progress Notes (Signed)
Discharge instructions reviewed with patient/family. All questions answered at this time. Transport home by family.   Kalynn Declercq, RN 

## 2015-12-12 ENCOUNTER — Telehealth: Payer: Self-pay | Admitting: Internal Medicine

## 2015-12-12 NOTE — Telephone Encounter (Signed)
Per D/C summary patient needs follow up in 2 weeks post hospital.  She is scheduled to see Nicoletta Ba PA on 12/21/15 1:30.  Patient notified

## 2015-12-13 ENCOUNTER — Other Ambulatory Visit: Payer: Self-pay | Admitting: Gastroenterology

## 2015-12-13 ENCOUNTER — Telehealth: Payer: Self-pay | Admitting: *Deleted

## 2015-12-13 ENCOUNTER — Ambulatory Visit (INDEPENDENT_AMBULATORY_CARE_PROVIDER_SITE_OTHER): Payer: BC Managed Care – PPO | Admitting: Family Medicine

## 2015-12-13 ENCOUNTER — Encounter: Payer: Self-pay | Admitting: Family Medicine

## 2015-12-13 VITALS — BP 118/72 | HR 74 | Temp 97.9°F | Ht 65.0 in | Wt 220.4 lb

## 2015-12-13 DIAGNOSIS — Z1159 Encounter for screening for other viral diseases: Secondary | ICD-10-CM

## 2015-12-13 DIAGNOSIS — K5792 Diverticulitis of intestine, part unspecified, without perforation or abscess without bleeding: Secondary | ICD-10-CM | POA: Diagnosis not present

## 2015-12-13 DIAGNOSIS — R531 Weakness: Secondary | ICD-10-CM | POA: Diagnosis not present

## 2015-12-13 DIAGNOSIS — J4520 Mild intermittent asthma, uncomplicated: Secondary | ICD-10-CM | POA: Diagnosis not present

## 2015-12-13 DIAGNOSIS — R946 Abnormal results of thyroid function studies: Secondary | ICD-10-CM

## 2015-12-13 DIAGNOSIS — R7989 Other specified abnormal findings of blood chemistry: Secondary | ICD-10-CM

## 2015-12-13 DIAGNOSIS — H9202 Otalgia, left ear: Secondary | ICD-10-CM

## 2015-12-13 MED ORDER — ACETIC ACID 2 % OT SOLN: 4.0000 [drp] | Freq: Three times a day (TID) | OTIC | Status: DC

## 2015-12-13 MED ORDER — FLUTICASONE-SALMETEROL 250-50 MCG/DOSE IN AEPB: 1.0000 | INHALATION_SPRAY | Freq: Two times a day (BID) | RESPIRATORY_TRACT | Status: DC

## 2015-12-13 NOTE — Patient Instructions (Signed)
Diverticulitis °Diverticulitis is inflammation or infection of small pouches in your colon that form when you have a condition called diverticulosis. The pouches in your colon are called diverticula. Your colon, or large intestine, is where water is absorbed and stool is formed. °Complications of diverticulitis can include: °· Bleeding. °· Severe infection. °· Severe pain. °· Perforation of your colon. °· Obstruction of your colon. °CAUSES  °Diverticulitis is caused by bacteria. °Diverticulitis happens when stool becomes trapped in diverticula. This allows bacteria to grow in the diverticula, which can lead to inflammation and infection. °RISK FACTORS °People with diverticulosis are at risk for diverticulitis. Eating a diet that does not include enough fiber from fruits and vegetables may make diverticulitis more likely to develop. °SYMPTOMS  °Symptoms of diverticulitis may include: °· Abdominal pain and tenderness. The pain is normally located on the left side of the abdomen, but may occur in other areas. °· Fever and chills. °· Bloating. °· Cramping. °· Nausea. °· Vomiting. °· Constipation. °· Diarrhea. °· Blood in your stool. °DIAGNOSIS  °Your health care provider will ask you about your medical history and do a physical exam. You may need to have tests done because many medical conditions can cause the same symptoms as diverticulitis. Tests may include: °· Blood tests. °· Urine tests. °· Imaging tests of the abdomen, including X-rays and CT scans. °When your condition is under control, your health care provider may recommend that you have a colonoscopy. A colonoscopy can show how severe your diverticula are and whether something else is causing your symptoms. °TREATMENT  °Most cases of diverticulitis are mild and can be treated at home. Treatment may include: °· Taking over-the-counter pain medicines. °· Following a clear liquid diet. °· Taking antibiotic medicines by mouth for 7-10 days. °More severe cases may  be treated at a hospital. Treatment may include: °· Not eating or drinking. °· Taking prescription pain medicine. °· Receiving antibiotic medicines through an IV tube. °· Receiving fluids and nutrition through an IV tube. °· Surgery. °HOME CARE INSTRUCTIONS  °· Follow your health care provider's instructions carefully. °· Follow a full liquid diet or other diet as directed by your health care provider. After your symptoms improve, your health care provider may tell you to change your diet. He or she may recommend you eat a high-fiber diet. Fruits and vegetables are good sources of fiber. Fiber makes it easier to pass stool. °· Take fiber supplements or probiotics as directed by your health care provider. °· Only take medicines as directed by your health care provider. °· Keep all your follow-up appointments. °SEEK MEDICAL CARE IF:  °· Your pain does not improve. °· You have a hard time eating food. °· Your bowel movements do not return to normal. °SEEK IMMEDIATE MEDICAL CARE IF:  °· Your pain becomes worse. °· Your symptoms do not get better. °· Your symptoms suddenly get worse. °· You have a fever. °· You have repeated vomiting. °· You have bloody or black, tarry stools. °MAKE SURE YOU:  °· Understand these instructions. °· Will watch your condition. °· Will get help right away if you are not doing well or get worse. °  °This information is not intended to replace advice given to you by your health care provider. Make sure you discuss any questions you have with your health care provider. °  °Document Released: 05/16/2005 Document Revised: 08/11/2013 Document Reviewed: 07/01/2013 °[employer]vier Interactive Patient Education ©2016 [employer] ° °

## 2015-12-13 NOTE — Telephone Encounter (Signed)
Pt returned call. [facility] # [phone]

## 2015-12-13 NOTE — Progress Notes (Signed)
Patient ID: ILDA LASKIN, female    DOB: 04-22-1961  Age: 55 y.o. MRN: [id number]    Subjective:  Subjective HPI REDINA ZELLER presents for hosp f/u -- she was admitted 4/19-4/21 for diverticulitis.  She was d/c on augmentin.     Review of Systems  Constitutional: Negative for diaphoresis, appetite change, fatigue and unexpected weight change.  Eyes: Negative for pain, redness and visual disturbance.  Respiratory: Negative for cough, chest tightness, shortness of breath and wheezing.   Cardiovascular: Negative for chest pain, palpitations and leg swelling.  Endocrine: Negative for cold intolerance, heat intolerance, polydipsia, polyphagia and polyuria.  Genitourinary: Negative for dysuria, frequency and difficulty urinating.  Neurological: Negative for dizziness, light-headedness, numbness and headaches.    History Past Medical History  Diagnosis Date  . Asthma   . Fibromyalgia   . IBS (irritable bowel syndrome)   . GERD (gastroesophageal reflux disease)   . Hyperlipidemia   . Personal history of other diseases of digestive system   . Other acute reactions to stress   . Pain in joint, upper arm   . Pain in joint, shoulder region   . Elevated blood pressure reading without diagnosis of hypertension     "just elevated when I'm in pain" (12/07/2015)  . Edema   . Pain in limb   . Herpes zoster without mention of complication   . Nontraumatic rupture of Achilles tendon   . Ganglion of joint     right wrist    She has past surgical history that includes Abdominal hysterectomy; Tubal ligation; Bunionectomy (Bilateral); Achilles tendon repair (Right, 2005); Ganglion cyst excision (Right); and Meniscus repair (Right, 04/2014).   Her family history includes Cancer in her brother; Coronary artery disease in her brother; Diabetes in her father; Heart disease in her father; Hyperlipidemia in her mother; Hypertension in her brother, father, and mother; Kidney disease in her father;  Leukemia in her brother; Lupus in her daughter; Pernicious anemia in her sister; Thyroid disease in her mother and sister.She reports that she has never smoked. She has never used smokeless tobacco. She reports that she does not drink alcohol or use illicit drugs.  Current Outpatient Prescriptions on File Prior to Visit  Medication Sig Dispense Refill  . acetaminophen (TYLENOL) 500 MG tablet Take 1,000 mg by mouth every 6 (six) hours as needed for moderate pain.     Marland Kitchen albuterol (PROAIR HFA) 108 (90 Base) MCG/ACT inhaler USE 2 PUFFS EVERY 4 HOURS AS NEEDED FOR COUGH, WHEEZE OR SHORTNESS OF BREATH. 8.5 each 2  . ALPRAZolam (XANAX) 0.25 MG tablet TAKE 1 TABLET BY MOUTH 3 TIMES A DAY AS NEEDED 30 tablet 0  . amoxicillin-clavulanate (AUGMENTIN) 875-125 MG tablet Take 1 tablet by mouth 2 (two) times daily. 20 tablet 0  . Blood Glucose Monitoring Suppl (FREESTYLE FREEDOM) KIT 1 Device by Does not apply route daily. 1 each 0  . Calcium Citrate-Vitamin D3 1000-400 LIQD 1 po qd (Patient taking differently: Take 1 tablet by mouth daily. ) 480 mL 5  . cholecalciferol (VITAMIN D) 1000 units tablet Take 2,000 Units by mouth daily.    Marland Kitchen Dexlansoprazole 30 MG capsule Take 1 capsule (30 mg total) by mouth daily. 90 capsule 3  . diclofenac sodium (VOLTAREN) 1 % GEL Apply 2 g topically daily as needed (pain). For pain    . fexofenadine (ALLEGRA) 180 MG tablet Take 1 tablet (180 mg total) by mouth daily. 90 tablet 3  . fluticasone (FLONASE) 50 MCG/ACT nasal  spray PLACE 2 SPRAYS INTO BOTH NOSTRILS DAILY. 16 g 4  . glucose blood (FREESTYLE LITE) test strip Check blood sugar once daily 100 each 2  . Iron Combinations (I.L.X. B-12) ELIX Take 2 drops by mouth daily.    Marland Kitchen lidocaine (LIDODERM) 5 % APPLY 1 PATCH ONTO SKIN EVERY 12 HOURS REMOVE AND DISCARD PATCH WITHIN 12 HOURS OR AS DIRECTED BY MD 30 patch 1  . meclizine (ANTIVERT) 32 MG tablet Take 1 tablet (32 mg total) by mouth 3 (three) times daily as needed for  dizziness. 30 tablet 0  . NONFORMULARY OR COMPOUNDED ITEM Compression stockings 20-30 mmhg,  Edema #1 1 each 1  . valACYclovir (VALTREX) 1000 MG tablet TAKE 2 TABLETS BY MOUTH TWICE A DAY FOR 1 DAY AND AS DIRECTED 30 tablet 5   No current facility-administered medications on file prior to visit.     Objective:  Objective Physical Exam  Constitutional: She is oriented to person, place, and time. She appears well-developed and well-nourished.  HENT:  Head: Normocephalic and atraumatic.  Eyes: Conjunctivae and EOM are normal.  Neck: Normal range of motion. Neck supple. No JVD present. Carotid bruit is not present. No thyromegaly present.  Cardiovascular: Normal rate, regular rhythm and normal heart sounds.   No murmur heard. Pulmonary/Chest: Effort normal and breath sounds normal. No respiratory distress. She has no wheezes. She has no rales. She exhibits no tenderness.  Musculoskeletal: She exhibits no edema.  Neurological: She is alert and oriented to person, place, and time.  Psychiatric: She has a normal mood and affect.  Nursing note and vitals reviewed.  BP 118/72 mmHg  Pulse 74  Temp(Src) 97.9 F (36.6 C) (Oral)  Ht 5' 5" (1.651 m)  Wt 220 lb 6.4 oz (99.973 kg)  BMI 36.68 kg/m2  SpO2 97% Wt Readings from Last 3 Encounters:  12/13/15 220 lb 6.4 oz (99.973 kg)  12/09/15 225 lb 1.4 oz (102.1 kg)  12/07/15 221 lb 6.4 oz (100.426 kg)     Lab Results  Component Value Date   WBC 5.0 12/14/2015   HGB 12.9 12/14/2015   HCT 38.6 12/14/2015   PLT 330.0 12/14/2015   GLUCOSE 94 12/14/2015   CHOL 174 09/07/2014   TRIG 101.0 09/07/2014   HDL 46.10 09/07/2014   LDLDIRECT 139.4 12/26/2011   LDLCALC 108* 09/07/2014   ALT 23 12/14/2015   AST 19 12/14/2015   NA 141 12/14/2015   K 4.1 12/14/2015   CL 106 12/14/2015   CREATININE 0.79 12/14/2015   BUN 12 12/14/2015   CO2 26 12/14/2015   TSH 0.021* 12/07/2015   HGBA1C 5.9* 12/07/2015    Ct Abdomen Pelvis W  Contrast  12/07/2015  CLINICAL DATA:  55 year old female with abdominal pain and nausea for 4 days. Initial encounter. EXAM: CT ABDOMEN AND PELVIS WITH CONTRAST TECHNIQUE: Multidetector CT imaging of the abdomen and pelvis was performed using the standard protocol following bolus administration of intravenous contrast. CONTRAST:  130m ISOVUE-300 IOPAMIDOL (ISOVUE-300) INJECTION 61% COMPARISON:  Lumbar MRI 12/17/2011 FINDINGS: Borderline to mild cardiomegaly. No pericardial effusion. No pleural effusion. Negative lung bases. Chronic L5-S1 disc and endplate degeneration. Degenerative changes at the pubic symphysis. No acute osseous abnormality identified. Trace pelvic free fluid. The uterus is surgically absent. The at adnexa are diminutive or absent. Decompressed rectum. Unremarkable urinary bladder. Small pelvic phleboliths. Inflamed sigmoid colon with wall thickening and mesenteric stranding in an area of diverticulosis. Wall thickening up to 12 mm. A segment of 7-8 cm of  the bowel is inflamed. There may be a trace amount of mesenteric gas on series 2, image 60. There is no associated fluid collection. Diverticulosis continues throughout the more proximal colon with no other areas of active inflammation identified. Negative appendix and terminal ileum. Oral contrast has not yet reached the TI. No dilated or inflamed small bowel. No free abdominal air. No abdominal free fluid. Negative stomach. Hepatic steatosis, otherwise negative liver. Gallbladder, spleen, pancreas and adrenal glands are within normal limits. Portal venous system is patent. Major arterial structures are patent. Minimal calcified iliac atherosclerosis. No lymphadenopathy. Bilateral renal enhancement and contrast excretion is normal. IMPRESSION: 1. Acute sigmoid colitis in a 7-8 cm segment which is likely secondary to acute diverticulitis. Possible micro-perforation (series 2, image 60), but no free intraperitoneal air, drainable collection, or  abscess. There is a small volume of associated pelvic free fluid. 2. Widespread colonic diverticulosis elsewhere with no other areas of active inflammation. 3. Hepatic steatosis. Electronically Signed   By: Genevie Ann M.D.   On: 12/07/2015 13:43     Assessment & Plan:  Plan I have discontinued Ms. Renton's B-12 and DEXILANT. I am also having her start on acetic acid. Additionally, I am having her maintain her acetaminophen, NONFORMULARY OR COMPOUNDED ITEM, valACYclovir, fexofenadine, Calcium Citrate-Vitamin D3, glucose blood, FREESTYLE FREEDOM, lidocaine, ALPRAZolam, cholecalciferol, Dexlansoprazole, albuterol, diclofenac sodium, meclizine, fluticasone, I.L.X. B-12, amoxicillin-clavulanate, and Fluticasone-Salmeterol.  Meds ordered this encounter  Medications  . Fluticasone-Salmeterol (ADVAIR DISKUS) 250-50 MCG/DOSE AEPB    Sig: Inhale 1 puff into the lungs 2 (two) times daily.    Dispense:  60 each    Refill:  2  . acetic acid (VOSOL) 2 % otic solution    Sig: Place 4 drops into the left ear 3 (three) times daily.    Dispense:  15 mL    Refill:  0    Problem List Items Addressed This Visit      Unprioritized   Acute diverticulitis - Primary   Relevant Orders   CBC with Differential/Platelet (Completed)   Comprehensive metabolic panel (Completed)   Vitamin D (25 hydroxy) (Completed)   Thyroid Panel With TSH    Other Visit Diagnoses    Need for hepatitis C screening test        Weakness        Relevant Orders    CBC with Differential/Platelet (Completed)    Comprehensive metabolic panel (Completed)    Vitamin D (25 hydroxy) (Completed)    Thyroid Panel With TSH    Asthma, chronic, mild intermittent, uncomplicated        Relevant Medications    Fluticasone-Salmeterol (ADVAIR DISKUS) 250-50 MCG/DOSE AEPB    Otalgia of left ear        Relevant Medications    acetic acid (VOSOL) 2 % otic solution    Abnormal thyroid blood test           Follow-up: Return if symptoms worsen or  fail to improve, for as scheduled.  [name], DO

## 2015-12-13 NOTE — Telephone Encounter (Signed)
Unable to reach patient at time of TCM call. Left message for patient to return call when available.   

## 2015-12-13 NOTE — Progress Notes (Signed)
Pre visit review using our clinic review tool, if applicable. No additional management support is needed unless otherwise documented below in the visit note. 

## 2015-12-14 ENCOUNTER — Other Ambulatory Visit (INDEPENDENT_AMBULATORY_CARE_PROVIDER_SITE_OTHER): Payer: BC Managed Care – PPO

## 2015-12-14 DIAGNOSIS — K5792 Diverticulitis of intestine, part unspecified, without perforation or abscess without bleeding: Secondary | ICD-10-CM

## 2015-12-14 DIAGNOSIS — R531 Weakness: Secondary | ICD-10-CM | POA: Diagnosis not present

## 2015-12-14 LAB — COMPREHENSIVE METABOLIC PANEL
ALBUMIN: 3.9 g/dL (ref 3.5–5.2)
ALT: 23 U/L (ref 0–35)
AST: 19 U/L (ref 0–37)
Alkaline Phosphatase: 60 U/L (ref 39–117)
BILIRUBIN TOTAL: 0.4 mg/dL (ref 0.2–1.2)
BUN: 12 mg/dL (ref 6–23)
CALCIUM: 9.5 mg/dL (ref 8.4–10.5)
CO2: 26 mEq/L (ref 19–32)
CREATININE: 0.79 mg/dL (ref 0.40–1.20)
Chloride: 106 mEq/L (ref 96–112)
GFR: 96.85 mL/min (ref >60.00)
Glucose, Bld: 94 mg/dL (ref 70–99)
Potassium: 4.1 mEq/L (ref 3.5–5.1)
Sodium: 141 mEq/L (ref 135–145)
Total Protein: 7.2 g/dL (ref 6.0–8.3)

## 2015-12-14 LAB — CBC WITH DIFFERENTIAL/PLATELET
BASOS ABS: 0 10*3/uL (ref 0.0–0.1)
BASOS PCT: 0.5 % (ref 0.0–3.0)
EOS ABS: 0.1 10*3/uL (ref 0.0–0.7)
EOS PCT: 2.8 % (ref 0.0–5.0)
HEMATOCRIT: 38.6 % (ref 36.0–46.0)
HEMOGLOBIN: 12.9 g/dL (ref 12.0–15.0)
LYMPHS PCT: 42 % (ref 12.0–46.0)
Lymphs Abs: 2.1 10*3/uL (ref 0.7–4.0)
MCHC: 33.5 g/dL (ref 30.0–36.0)
MCV: 84.4 fl (ref 78.0–100.0)
Monocytes Absolute: 0.4 10*3/uL (ref 0.1–1.0)
Monocytes Relative: 8.6 % (ref 3.0–12.0)
NEUTROS ABS: 2.3 10*3/uL (ref 1.4–7.7)
Neutrophils Relative %: 46.1 % (ref 43.0–77.0)
Platelets: 330 10*3/uL (ref 150.0–400.0)
RBC: 4.57 Mil/uL (ref 3.87–5.11)
RDW: 13.7 % (ref 11.5–15.5)
WBC: 5 10*3/uL (ref 4.0–10.5)

## 2015-12-14 LAB — THYROID PANEL WITH TSH
Free Thyroxine Index: 3.8 (ref 1.4–3.8)
T3 Uptake: 34 % (ref 22–35)
T4 TOTAL: 11.3 ug/dL (ref 4.5–12.0)
TSH: 0.01 mIU/L — ABNORMAL LOW

## 2015-12-14 LAB — VITAMIN D 25 HYDROXY (VIT D DEFICIENCY, FRACTURES): VITD: 23.84 ng/mL — AB (ref 30.00–100.00)

## 2015-12-21 ENCOUNTER — Ambulatory Visit: Payer: BC Managed Care – PPO | Admitting: Physician Assistant

## 2015-12-21 ENCOUNTER — Ambulatory Visit (INDEPENDENT_AMBULATORY_CARE_PROVIDER_SITE_OTHER): Payer: BC Managed Care – PPO | Admitting: Gastroenterology

## 2015-12-21 ENCOUNTER — Encounter: Payer: Self-pay | Admitting: Gastroenterology

## 2015-12-21 ENCOUNTER — Other Ambulatory Visit: Payer: Self-pay

## 2015-12-21 VITALS — BP 104/66 | HR 76 | Ht 65.0 in | Wt 222.4 lb

## 2015-12-21 DIAGNOSIS — Z860100 Personal history of colon polyps, unspecified: Secondary | ICD-10-CM

## 2015-12-21 DIAGNOSIS — K219 Gastro-esophageal reflux disease without esophagitis: Secondary | ICD-10-CM | POA: Diagnosis not present

## 2015-12-21 DIAGNOSIS — K5732 Diverticulitis of large intestine without perforation or abscess without bleeding: Secondary | ICD-10-CM | POA: Diagnosis not present

## 2015-12-21 DIAGNOSIS — E0590 Thyrotoxicosis, unspecified without thyrotoxic crisis or storm: Secondary | ICD-10-CM

## 2015-12-21 MED ORDER — DEXLANSOPRAZOLE 30 MG PO CPDR: 30.0000 mg | DELAYED_RELEASE_CAPSULE | Freq: Every day | ORAL | Status: DC

## 2015-12-21 NOTE — Progress Notes (Signed)
HPI :  INTAKE VISIT: 55 y/o female here to follow up for her GERD. Previously seen by Dr. Deatra Ina.   Her main reflux symptoms are pyrosis and chest discomfort. She is on Dexilant for her symptoms which she thinks helps her significantly. If she misses dosing for up to 2-3 days she will have some recurrence. She reports it works very well and without any significant breakthrough. On prior PPIs she has not had as much benefit. If she drinks milk and dairy she can have bloating and feels uncomfortable. She tries to use low fat or almond milk. She has tried lactaid milk in the past, but she thinks it can still cause gas. She has used lactaid enzymes which she thinks helps as well. She tries to avoid drinking milk and eating dairy in general due to symptoms and she does well in this setting. No dysphagia or odynophagia.  She has no blood in the stools. No problems with her bowel habits otherwise if she eats high fiber diet. Last colonoscopy as below. No FH of colon cancer. No weight loss.   EGD 04/2008 - normal esophagus, H pylori gastriits, normal duodenum  Last colonoscopy in 2012 with a 90m descending colon polyp removed but not retrieved.  SINCE LAST VISIT:  Patient had a colonoscopy  09/30/2015 - seven small colon polyps - adenomas, sessile serrated, recall in 3 years, pancolonic diverticulosis, and hemorrhoids  Patient was unfortunately admitted in April for diverticulitis with contained possible microperforation. She was treated with bowel rest and antibiotics and did well, discharged. Patient presented with severe lower abdominal pain, no fevers. No nausea or vomiting. Pain was diffusely the lower abdomen to LLQ. She was admitted for 3 dayswas treated with Augmentin at discharge given her allergy to flagyl. This was her first episode of diverticulitis.    She generally has felt well since her discharge. She has not had any signficant pain since discharge and she is doing well. She had a few  rare twinges of pain in the lower abdomen but nothing significant. No fevers. She finished her antibiotics yesterday, she completed a 10 day course. She had some loose stools while on Augmentin which resolved when she stopped it.   Otherwise, patient reported she had been on Dexilant 388msince our last visit, (previously on 6036mose). She reported stable symptoms on 76m77mmpared to previous 60mg53me has been on a variety of PPIs without benefit or as good as control. She is happy on 76mg 34mng at this time but states pharmacy gave her 60mg d28mg when she got a refill.     Past Medical History  Diagnosis Date  . Asthma   . Fibromyalgia   . IBS (irritable bowel syndrome)   . GERD (gastroesophageal reflux disease)   . Hyperlipidemia   . Personal history of other diseases of digestive system   . Other acute reactions to stress   . Pain in joint, upper arm   . Pain in joint, shoulder region   . Elevated blood pressure reading without diagnosis of hypertension     "just elevated when I'm in pain" (12/07/2015)  . Edema   . Pain in limb   . Herpes zoster without mention of complication   . Nontraumatic rupture of Achilles tendon   . Ganglion of joint     right wrist     Past Surgical History  Procedure Laterality Date  . Abdominal hysterectomy    . Tubal ligation    . Bunionectomy  Bilateral     Bunionectomy 1983  . Achilles tendon repair Right 2005  . Ganglion cyst excision Right     wrist  . Meniscus repair Right 04/2014   Family History  Problem Relation Age of Onset  . Coronary artery disease Brother   . Hyperlipidemia Mother   . Hypertension Father   . Diabetes Father   . Heart disease Father   . Kidney disease Father     Died, 25  . Leukemia Brother   . Cancer Brother     myleoblastic anemia  . Hypertension Mother     Died, 59  . Thyroid disease Mother     Thyroid surgery  . Pernicious anemia Sister   . Thyroid disease Sister     On thyroid Rx  . Lupus  Daughter   . Hypertension Brother    Social History  Substance Use Topics  . Smoking status: Never Smoker   . Smokeless tobacco: Never Used  . Alcohol Use: No   Current Outpatient Prescriptions  Medication Sig Dispense Refill  . acetaminophen (TYLENOL) 500 MG tablet Take 1,000 mg by mouth every 6 (six) hours as needed for moderate pain.     Marland Kitchen acetic acid (VOSOL) 2 % otic solution Place 4 drops into the left ear 3 (three) times daily. 15 mL 0  . albuterol (PROAIR HFA) 108 (90 Base) MCG/ACT inhaler USE 2 PUFFS EVERY 4 HOURS AS NEEDED FOR COUGH, WHEEZE OR SHORTNESS OF BREATH. 8.5 each 2  . ALPRAZolam (XANAX) 0.25 MG tablet TAKE 1 TABLET BY MOUTH 3 TIMES A DAY AS NEEDED 30 tablet 0  . Blood Glucose Monitoring Suppl (FREESTYLE FREEDOM) KIT 1 Device by Does not apply route daily. 1 each 0  . Calcium Citrate-Vitamin D3 1000-400 LIQD 1 po qd (Patient taking differently: Take 1 tablet by mouth daily. ) 480 mL 5  . cholecalciferol (VITAMIN D) 1000 units tablet Take 2,000 Units by mouth daily.    Marland Kitchen Dexlansoprazole 30 MG capsule Take 1 capsule (30 mg total) by mouth daily. 90 capsule 3  . diclofenac sodium (VOLTAREN) 1 % GEL Apply 2 g topically daily as needed (pain). For pain    . fexofenadine (ALLEGRA) 180 MG tablet Take 1 tablet (180 mg total) by mouth daily. 90 tablet 3  . fluticasone (FLONASE) 50 MCG/ACT nasal spray PLACE 2 SPRAYS INTO BOTH NOSTRILS DAILY. 16 g 4  . Fluticasone-Salmeterol (ADVAIR DISKUS) 250-50 MCG/DOSE AEPB Inhale 1 puff into the lungs 2 (two) times daily. 60 each 2  . glucose blood (FREESTYLE LITE) test strip Check blood sugar once daily 100 each 2  . Iron Combinations (I.L.X. B-12) ELIX Take 2 drops by mouth daily.    Marland Kitchen lidocaine (LIDODERM) 5 % APPLY 1 PATCH ONTO SKIN EVERY 12 HOURS REMOVE AND DISCARD PATCH WITHIN 12 HOURS OR AS DIRECTED BY MD 30 patch 1  . meclizine (ANTIVERT) 32 MG tablet Take 1 tablet (32 mg total) by mouth 3 (three) times daily as needed for dizziness. 30  tablet 0  . NONFORMULARY OR COMPOUNDED ITEM Compression stockings 20-30 mmhg,  Edema #1 1 each 1  . valACYclovir (VALTREX) 1000 MG tablet TAKE 2 TABLETS BY MOUTH TWICE A DAY FOR 1 DAY AND AS DIRECTED 30 tablet 5   No current facility-administered medications for this visit.   Allergies  Allergen Reactions  . Gentamicin     Eye drops turned the sclera bright red  . Aspirin     REACTION: anaphylaxis  . Doxycycline  REACTION: severe nausea/vomiting  . Ibuprofen     REACTION: anaphylaxsis  . Influenza A (H1n1) Monoval Vac     REACTION: sick for 3 weeks  . Loratadine Other (See Comments)    Fatigue/weakness  . Metronidazole     REACTION: red face/swelling  . Nsaids     REACTION: anaphylaxis  . Ranitidine Hcl     REACTION: Lips re/peel  . Sulfamethoxazole-Trimethoprim     REACTION: face red/peel  . Tramadol Hcl     REACTION: paranoid     Review of Systems: All systems reviewed and negative except where noted in HPI.   Lab Results  Component Value Date   WBC 5.0 12/14/2015   HGB 12.9 12/14/2015   HCT 38.6 12/14/2015   MCV 84.4 12/14/2015   PLT 330.0 12/14/2015    Lab Results  Component Value Date   ALT 23 12/14/2015   AST 19 12/14/2015   ALKPHOS 60 12/14/2015   BILITOT 0.4 12/14/2015      Ct Abdomen Pelvis W Contrast  12/07/2015  CLINICAL DATA:  55 year old female with abdominal pain and nausea for 4 days. Initial encounter. EXAM: CT ABDOMEN AND PELVIS WITH CONTRAST TECHNIQUE: Multidetector CT imaging of the abdomen and pelvis was performed using the standard protocol following bolus administration of intravenous contrast. CONTRAST:  140m ISOVUE-300 IOPAMIDOL (ISOVUE-300) INJECTION 61% COMPARISON:  Lumbar MRI 12/17/2011 FINDINGS: Borderline to mild cardiomegaly. No pericardial effusion. No pleural effusion. Negative lung bases. Chronic L5-S1 disc and endplate degeneration. Degenerative changes at the pubic symphysis. No acute osseous abnormality identified. Trace  pelvic free fluid. The uterus is surgically absent. The at adnexa are diminutive or absent. Decompressed rectum. Unremarkable urinary bladder. Small pelvic phleboliths. Inflamed sigmoid colon with wall thickening and mesenteric stranding in an area of diverticulosis. Wall thickening up to 12 mm. A segment of 7-8 cm of the bowel is inflamed. There may be a trace amount of mesenteric gas on series 2, image 60. There is no associated fluid collection. Diverticulosis continues throughout the more proximal colon with no other areas of active inflammation identified. Negative appendix and terminal ileum. Oral contrast has not yet reached the TI. No dilated or inflamed small bowel. No free abdominal air. No abdominal free fluid. Negative stomach. Hepatic steatosis, otherwise negative liver. Gallbladder, spleen, pancreas and adrenal glands are within normal limits. Portal venous system is patent. Major arterial structures are patent. Minimal calcified iliac atherosclerosis. No lymphadenopathy. Bilateral renal enhancement and contrast excretion is normal. IMPRESSION: 1. Acute sigmoid colitis in a 7-8 cm segment which is likely secondary to acute diverticulitis. Possible micro-perforation (series 2, image 60), but no free intraperitoneal air, drainable collection, or abscess. There is a small volume of associated pelvic free fluid. 2. Widespread colonic diverticulosis elsewhere with no other areas of active inflammation. 3. Hepatic steatosis. Electronically Signed   By: HGenevie AnnM.D.   On: 12/07/2015 13:43    Physical Exam: BP 104/66 mmHg  Pulse 76  Ht _0  (1.651 m)  Wt 222 lb 6.4 oz (100.88 kg)  BMI 37.01 kg/m2 Constitutional: Pleasant,well-developed, female in no acute distress. HEENT: Normocephalic and atraumatic. Conjunctivae are normal. No scleral icterus. Neck supple.  Cardiovascular: Normal rate, regular rhythm.  Pulmonary/chest: Effort normal and breath sounds normal. No wheezing, rales or  rhonchi. Abdominal: Soft, nondistended, nontender. Bowel sounds active throughout. There are no masses palpable. No hepatomegaly. Extremities: no edema Lymphadenopathy: No cervical adenopathy noted. Neurological: Alert and oriented to person place and time. Skin: Skin is warm  and dry. No rashes noted. Psychiatric: Normal mood and affect. Behavior is normal.   ASSESSMENT AND PLAN: 55 y/o female here for follow up for issues as outlined below.   Diverticulitis - she had a colonoscopy a few months ago showing pancolonic diverticulosis with multiple small adenomas/sessile serrated polyps removed. She was unfortunately admitted with suspected sigmoid diverticulitis last month based on symptoms and imaging findings, although no significant leukocytosis. There is a question as to whether there was a contained microperforation or not. She has recovered completely with antibiotics and feels well. I discussed diverticulitis with her and risk for recurrence, especially with possible microperforation. Given the report of possible microperforation I offered her a surgical consultation to discuss possible sigmoidectomy to prevent future recurrence. She felt strongly against any surgical intervention at this time, and wants to avoid this if at all possible. She understands the risk of diverticulitis recurrence with complications to include bowel perforation and abscess formation and again did not wish to have a surgical consult. Recommend daily fiber supplementation and if she has any recurrence of her symptoms to contact us immediately, would have low threshold for empiric antibiotics and reimaging. She agreed.   GERD - longstanding, prior EGD showed no evidence of Barrett's. She has not done too well on other PPIs, but Dexilant has worked very well for her. The risks of long term PPIs with current data include increased risk for chronic kidney disease, increased risk of fracture, increased risk of C diff, increased  risk of pneumonia (short term usage), potentially increased risk of dementia, potentially increased risk of B12 / calcium deficiency, and rare risk of hypomagnesemia. Recent studies have also shown an association with increased risk of cardiovascular outcomes including stroke. These studies have showed an association between PPIs and several of these outcomes but no evidence of causality. The patient was counseled to use the lowest daily use of PPI needed to control symptom, we have weaned her to 8m Dexilant, will continue this for now.   Colon adenomas - 7 small polyps removed at last exam, due for a recall colonoscopy in 3 years with this finding. She agreed.   SCarolina Cellar MD [facility]

## 2015-12-21 NOTE — Patient Instructions (Signed)
We have sent Dexilant 30 mg to your pharmacy

## 2015-12-27 ENCOUNTER — Other Ambulatory Visit (INDEPENDENT_AMBULATORY_CARE_PROVIDER_SITE_OTHER): Payer: BC Managed Care – PPO

## 2015-12-27 ENCOUNTER — Encounter: Payer: Self-pay | Admitting: Internal Medicine

## 2015-12-27 ENCOUNTER — Ambulatory Visit (INDEPENDENT_AMBULATORY_CARE_PROVIDER_SITE_OTHER): Payer: BC Managed Care – PPO | Admitting: Internal Medicine

## 2015-12-27 VITALS — BP 122/70 | HR 80 | Temp 98.4°F | Resp 12 | Ht 65.0 in | Wt 221.6 lb

## 2015-12-27 DIAGNOSIS — E0590 Thyrotoxicosis, unspecified without thyrotoxic crisis or storm: Secondary | ICD-10-CM

## 2015-12-27 LAB — T4, FREE: FREE T4: 1.61 ng/dL — AB (ref 0.60–1.60)

## 2015-12-27 LAB — T3, FREE: T3, Free: 5.1 pg/mL — ABNORMAL HIGH (ref 2.3–4.2)

## 2015-12-27 LAB — TSH: TSH: 0.01 u[IU]/mL — ABNORMAL LOW (ref 0.35–4.50)

## 2015-12-27 NOTE — Patient Instructions (Addendum)
Please stop at [employer].  Please come back for a follow-up appointment in 3 months.  Hyperthyroidism Hyperthyroidism is when the thyroid is too active (overactive). Your thyroid is a large gland that is located in your neck. The thyroid helps to control how your body uses food (metabolism). When your thyroid is overactive, it produces too much of a hormone called thyroxine.  CAUSES Causes of hyperthyroidism may include:  Graves disease. This is when your immune system attacks the thyroid gland. This is the most common cause.  Inflammation of the thyroid gland.  Tumor in the thyroid gland or somewhere else.  Excessive use of thyroid medicines, including:  Prescription thyroid supplement.  Herbal supplements that mimic thyroid hormones.  Solid or fluid-filled lumps within your thyroid gland (thyroid nodules).  Excessive ingestion of iodine. RISK FACTORS  Being female.  Having a family history of thyroid conditions. SIGNS AND SYMPTOMS Signs and symptoms of hyperthyroidism may include:  Nervousness.  Inability to tolerate heat.  Unexplained weight loss.  Diarrhea.  Change in the texture of hair or skin.  Heart skipping beats or making extra beats.  Rapid heart rate.  Loss of menstruation.  Shaky hands.  Fatigue.  Restlessness.  Increased appetite.  Sleep problems.  Enlarged thyroid gland or nodules. DIAGNOSIS  Diagnosis of hyperthyroidism may include:  Medical history and physical exam.  Blood tests.  Ultrasound tests. TREATMENT Treatment may include:  Medicines to control your thyroid.  Surgery to remove your thyroid.  Radiation therapy. HOME CARE INSTRUCTIONS   Take medicines only as directed by your health care provider.  Do not use any tobacco products, including cigarettes, chewing tobacco, or electronic cigarettes. If you need help quitting, ask your health care provider.  Do not exercise or do physical activity until your health  care provider approves.  Keep all follow-up appointments as directed by your health care provider. This is important. SEEK MEDICAL CARE IF:  Your symptoms do not get better with treatment.  You have fever.  You are taking thyroid replacement medicine and you:  Have depression.  Feel mentally and physically slow.  Have weight gain. SEEK IMMEDIATE MEDICAL CARE IF:   You have decreased alertness or a change in your awareness.  You have abdominal pain.  You feel dizzy.  You have a rapid heartbeat.  You have an irregular heartbeat.   This information is not intended to replace advice given to you by your health care provider. Make sure you discuss any questions you have with your health care provider.   Document Released: 08/06/2005 Document Revised: 08/27/2014 Document Reviewed: 12/22/2013 Elsevier Interactive Patient Education [employer].

## 2015-12-27 NOTE — Progress Notes (Signed)
Patient ID: CHARLIENE Osborne, female   DOB: 1961/01/11, 55 y.o.   MRN: [id number]    HPI  Emily Osborne is a 55 y.o.-year-old female, referred by her PCP, Dr. Etter Sjogren for evaluation for thyrotoxicosis.  patient was previously seen by Dr. Dwyane Dee , last visit 10/2013.   At the last visit with Dr. Dwyane Dee, patient had a mildly low TSH, with normal free hormones.  Since then, she developed lower TSH levels , and now also elevated free T4. She had an episode of sinusitis ~1-2 mo ago.  She was recently admitted for diverticulitis on 12/07/2015. At that time, her TFTs were checked and they appeared worse than before.  I reviewed pt's thyroid tests: Lab Results  Component Value Date   TSH 0.01* 12/14/2015   TSH 0.021* 12/07/2015   TSH 0.11* 10/31/2015   TSH 0.87 09/07/2014   TSH 0.29* 10/28/2013   TSH 0.32* 09/02/2013   TSH 0.59 06/20/2012   TSH 0.57 06/21/2011   TSH 0.38 03/06/2011   TSH 0.89 09/07/2010   FREET4 1.34* 12/08/2015   FREET4 1.18 11/02/2015   FREET4 0.90 10/28/2013   FREET4 0.8 12/27/2009    Pt denies feeling nodules in neck, hoarseness, dysphagia/odynophagia, SOB with lying down; she c/o: - + fatigue (feeling weak) - + excessive sweating/heat intolerance - + tremors - no anxiety - + palpitations - no hyperdefecation, + AP (diverticulitis) - + weight loss (diverticulitis) - no hair loss  Pt does not have a FH of thyroid ds. (mother, sister). No FH of thyroid cancer. No h/o radiation tx to head or neck.  No seaweed or kelp, + recent contrast studies (CT scan 12/07/2015). No steroid use. No herbal supplements. No Biotin use.   she does have a history of B12 deficiency.  ROS: Constitutional: + weight loss, no fatigue, + subjective hyperthermia/hypothermia (had chills before her diagnosis of diverticulitis), +  nocturia Eyes: no blurry vision, no xerophthalmia ENT: no sore throat, no nodules palpated in throat, no dysphagia/odynophagia, no hoarseness Cardiovascular: no  CP/SOB/ + palpitations/no leg swelling Respiratory: no cough/SOB Gastrointestinal:  + N/ no V/D/C, +  Acid reflux Musculoskeletal: no muscle/joint aches Skin: no rashes Neurological: +  Occasional tremors/no numbness/tingling/dizziness Psychiatric: no depression/anxiety  Past Medical History  Diagnosis Date  . Asthma   . Fibromyalgia   . IBS (irritable bowel syndrome)   . GERD (gastroesophageal reflux disease)   . Hyperlipidemia   . Personal history of other diseases of digestive system   . Other acute reactions to stress   . Pain in joint, upper arm   . Pain in joint, shoulder region   . Elevated blood pressure reading without diagnosis of hypertension     "just elevated when I'm in pain" (12/07/2015)  . Edema   . Pain in limb   . Herpes zoster without mention of complication   . Nontraumatic rupture of Achilles tendon   . Ganglion of joint     right wrist   Past Surgical History  Procedure Laterality Date  . Abdominal hysterectomy    . Tubal ligation    . Bunionectomy Bilateral     Bunionectomy 1983  . Achilles tendon repair Right 2005  . Ganglion cyst excision Right     wrist  . Meniscus repair Right 04/2014   Social History   Social History  . Marital Status: Divorced    Spouse Name: N/A  . Number of Children: 2  . Years of Education: N/A   Occupational History  .  Nurse/teacher    Social History Main Topics  . Smoking status: Never Smoker   . Smokeless tobacco: Never Used  . Alcohol Use: No  . Drug Use: No  . Sexual Activity:    Partners: Male   Other Topics Concern  . Not on file   Social History Narrative   Lives with alone.  She has two daughter.   She works as a Programme researcher, broadcasting/film/video at [employer]--- 3x a week , gym-----20-30 min   Current Outpatient Prescriptions on File Prior to Visit  Medication Sig Dispense Refill  . acetaminophen (TYLENOL) 500 MG tablet Take 1,000 mg by mouth every 6 (six) hours as needed for moderate pain.     Marland Kitchen  acetic acid (VOSOL) 2 % otic solution Place 4 drops into the left ear 3 (three) times daily. 15 mL 0  . albuterol (PROAIR HFA) 108 (90 Base) MCG/ACT inhaler USE 2 PUFFS EVERY 4 HOURS AS NEEDED FOR COUGH, WHEEZE OR SHORTNESS OF BREATH. 8.5 each 2  . ALPRAZolam (XANAX) 0.25 MG tablet TAKE 1 TABLET BY MOUTH 3 TIMES A DAY AS NEEDED 30 tablet 0  . Blood Glucose Monitoring Suppl (FREESTYLE FREEDOM) KIT 1 Device by Does not apply route daily. 1 each 0  . Calcium Citrate-Vitamin D3 1000-400 LIQD 1 po qd (Patient taking differently: Take 1 tablet by mouth daily. ) 480 mL 5  . cholecalciferol (VITAMIN D) 1000 units tablet Take 2,000 Units by mouth daily.    Marland Kitchen Dexlansoprazole 30 MG capsule Take 1 capsule (30 mg total) by mouth daily. 90 capsule 3  . diclofenac sodium (VOLTAREN) 1 % GEL Apply 2 g topically daily as needed (pain). For pain    . fexofenadine (ALLEGRA) 180 MG tablet Take 1 tablet (180 mg total) by mouth daily. 90 tablet 3  . fluticasone (FLONASE) 50 MCG/ACT nasal spray PLACE 2 SPRAYS INTO BOTH NOSTRILS DAILY. 16 g 4  . Fluticasone-Salmeterol (ADVAIR DISKUS) 250-50 MCG/DOSE AEPB Inhale 1 puff into the lungs 2 (two) times daily. 60 each 2  . glucose blood (FREESTYLE LITE) test strip Check blood sugar once daily 100 each 2  . Iron Combinations (I.L.X. B-12) ELIX Take 2 drops by mouth daily.    Marland Kitchen lidocaine (LIDODERM) 5 % APPLY 1 PATCH ONTO SKIN EVERY 12 HOURS REMOVE AND DISCARD PATCH WITHIN 12 HOURS OR AS DIRECTED BY MD 30 patch 1  . meclizine (ANTIVERT) 32 MG tablet Take 1 tablet (32 mg total) by mouth 3 (three) times daily as needed for dizziness. 30 tablet 0  . NONFORMULARY OR COMPOUNDED ITEM Compression stockings 20-30 mmhg,  Edema #1 1 each 1  . valACYclovir (VALTREX) 1000 MG tablet TAKE 2 TABLETS BY MOUTH TWICE A DAY FOR 1 DAY AND AS DIRECTED 30 tablet 5   No current facility-administered medications on file prior to visit.   Allergies  Allergen Reactions  . Gentamicin     Eye drops turned  the sclera bright red  . Aspirin     REACTION: anaphylaxis  . Doxycycline     REACTION: severe nausea/vomiting  . Ibuprofen     REACTION: anaphylaxsis  . Influenza A (H1n1) Monoval Vac     REACTION: sick for 3 weeks  . Loratadine Other (See Comments)    Fatigue/weakness  . Metronidazole     REACTION: red face/swelling  . Nsaids     REACTION: anaphylaxis  . Ranitidine Hcl     REACTION: Lips re/peel  . Sulfamethoxazole-Trimethoprim  REACTION: face red/peel  . Tramadol Hcl     REACTION: paranoid   Family History  Problem Relation Age of Onset  . Coronary artery disease Brother   . Hyperlipidemia Mother   . Hypertension Father   . Diabetes Father   . Heart disease Father   . Kidney disease Father     Died, 56  . Leukemia Brother   . Cancer Brother     myleoblastic anemia  . Hypertension Mother     Died, 3  . Thyroid disease Mother     Thyroid surgery  . Pernicious anemia Sister   . Thyroid disease Sister     On thyroid Rx  . Lupus Daughter   . Hypertension Brother    PE: BP 122/70 mmHg  Pulse 80  Temp(Src) 98.4 F (36.9 C) (Oral)  Resp 12  Ht 5' 5"  (1.651 m)  Wt 221 lb 9.6 oz (100.517 kg)  BMI 36.88 kg/m2  SpO2 97% Wt Readings from Last 3 Encounters:  12/27/15 221 lb 9.6 oz (100.517 kg)  12/21/15 222 lb 6.4 oz (100.88 kg)  12/13/15 220 lb 6.4 oz (99.973 kg)   Constitutional: overweight, in NAD Eyes: PERRLA, EOMI, no exophthalmos, no lid lag, no stare ENT: moist mucous membranes, no thyromegaly, no thyroid bruits, no cervical lymphadenopathy Cardiovascular: RRR, No MRG Respiratory: CTA B Gastrointestinal: abdomen soft, NT, ND, BS+ Musculoskeletal: no deformities, strength intact in all 4 Skin: moist, warm, no rashes Neurological:  Very mild tremor with outstretched hands, DTR normal in all 4  ASSESSMENT: 1. Thyrotoxicosis  PLAN:  1. Patient with a recently found low TSH, with possible thyrotoxic sxs: weight loss (but she also had diverticulitis  recently),  Some heat intolerance, tremors, palpitations. - she does not appear to have exogenous causes for the low TSH,  However, she did have a CT scan with contrast on 12/07/2015. Her TFTs were drawn after the iodine load on that day, however, she had a mildly low TSH a month prior to this. - We discussed that possible causes of thyrotoxicosis are:  Graves ds   Thyroiditis toxic multinodular goiter/ toxic adenoma (I cannot feel nodules at palpation of her thyroid). - I suggested that we check the TSH, fT3 and fT4 and also add thyroid stimulating antibodies to screen for Graves' disease.  - If the tests remain abnormal, we may need an uptake and scan to differentiate between the 3 above possible etiologies.  However, since she had the recent abdominal CT less than a month ago (high iodine load), we need to wait another ~2 months to check the uptake and scan. - we discussed about possible modalities of treatment for the above conditions, to include methimazole use, radioactive iodine ablation or (last resort) surgery.  - I did advice her that we might need to do thyroid ultrasound depending on the results of the uptake and scan (if a cold nodule is present) - I do not feel that we need to add beta blockers at this time, since she is not tachycardic, anxious, or tremulous - Return in about 4 months (around 04/28/2016)., but likely sooner for repeat labs  - time spent with the patient: 45 min, of which >50% was spent in obtaining information about her symptoms, reviewing her previous labs, evaluations, and treatments, counseling her about her condition (please see the discussed topics above), and developing a plan to further investigate and treat it.   Component     Latest Ref Rng 09/07/2014 10/31/2015 11/02/2015 12/07/2015  TSH     0.35 - 4.50 uIU/mL 0.87 0.11 (L)  0.021 (L)  T4,Free(Direct)     0.60 - 1.60 ng/dL   1.18   Triiodothyronine,Free,Serum     2.3 - 4.2 pg/mL   3.7    Component      Latest Ref Rng 12/08/2015 12/14/2015 12/27/2015  Thyroxine (T4)     4.5 - 12.0 ug/dL  11.3   T3 Uptake     22 - 35 %  34   Free Thyroxine Index     1.4 - 3.8  3.8   TSH     0.35 - 4.50 uIU/mL  0.01 (L) 0.01 (L)  T4,Free(Direct)     0.60 - 1.60 ng/dL 1.34 (H)  1.61 (H)  Triiodothyronine,Free,Serum     2.3 - 4.2 pg/mL   5.1 (H)  Triiodothyronine (T3)     71 - 180 ng/dL 139     TSH low, and fT4 and fT3 slightly high.  I suspect mild Graves' disease. I will await the results of the TSI antibodies.  After these are back, will probably need to start methimazole 5 mg daily. We cannot perform an uptake and scan yet until she clears the iodine from her system.  Component     Latest Ref Rng 12/27/2015  TSI     <140 % baseline 393 (H)   TSI is also elevated >>  most likely Graves' disease. Will start methimazole 5 mg daily and recheck her TFTs in a month and a half.

## 2016-01-01 LAB — THYROID STIMULATING IMMUNOGLOBULIN: TSI: 393 %{baseline} — AB (ref <140)

## 2016-01-02 DIAGNOSIS — E0500 Thyrotoxicosis with diffuse goiter without thyrotoxic crisis or storm: Secondary | ICD-10-CM | POA: Insufficient documentation

## 2016-01-02 HISTORY — DX: Thyrotoxicosis with diffuse goiter without thyrotoxic crisis or storm: E05.00

## 2016-01-02 MED ORDER — METHIMAZOLE 5 MG PO TABS: 5.0000 mg | ORAL_TABLET | Freq: Every day | ORAL | Status: DC

## 2016-01-03 ENCOUNTER — Telehealth: Payer: Self-pay | Admitting: Internal Medicine

## 2016-01-03 NOTE — Telephone Encounter (Signed)
Called pt and advised her per Dr Gherghe's message. Pt voiced understanding.  

## 2016-01-03 NOTE — Telephone Encounter (Signed)
Yes, let's start [place] - this should help. Please advise her about poss. SEs, although very rare at this low dose: Please stop the Methimazole (Tapazole) and call us or your primary care doctor if you develop: - sore throat - fever - yellow skin - dark urine - light colored stools As we will then need to check your blood counts and liver tests.

## 2016-01-03 NOTE — Telephone Encounter (Signed)
Returned pt's call. Pt stated that she feels nervous, jittery, tremors today, she feels weird, dizzy at times, hot and then cold. Pt stated that she feels she does need to take something. Pt ok to take the medication that Dr Cruzita Lederer advised her in her MyChart message. Please advise.

## 2016-01-03 NOTE — Telephone Encounter (Signed)
PT called and said she is just feeling very weird and weak today, she wants to talk to you about it, she knows that Dr. Cruzita Lederer is wanting to put her on new medication, and she is just very nervous about that too.

## 2016-01-04 ENCOUNTER — Other Ambulatory Visit: Payer: Self-pay | Admitting: *Deleted

## 2016-01-04 ENCOUNTER — Telehealth: Payer: Self-pay | Admitting: Gastroenterology

## 2016-01-04 ENCOUNTER — Telehealth: Payer: Self-pay | Admitting: Internal Medicine

## 2016-01-04 MED ORDER — METHIMAZOLE 5 MG PO TABS: 5.0000 mg | ORAL_TABLET | Freq: Every day | ORAL | Status: DC

## 2016-01-04 MED ORDER — AMOXICILLIN-POT CLAVULANATE 875-125 MG PO TABS: 1.0000 | ORAL_TABLET | Freq: Two times a day (BID) | ORAL | Status: DC

## 2016-01-04 NOTE — Telephone Encounter (Signed)
Chart reviewed - she has a history of diverticulitis with recurrent symptoms. If these symptoms are consistent with prior diverticulitis, which it sounds like, please give her  Augmentin BID for 14 days to treat empirically for this. This antibiotic will likely cause diarrhea. If no improvement in her symptoms despite this please contact us. Thanks

## 2016-01-04 NOTE — Telephone Encounter (Signed)
Spoke with patient and she states she started having abdominal pain on Monday. Yesterday, the pain in LLQ was off and on. She also had chills yesterday. She has LLQ pain today. It was better after she went to the bathroom. She also reports she has been diagnosed with hyperthyroidism and has symptoms from that also. She is starting medication for this. Denies nausea or fever. Please, advise.

## 2016-01-04 NOTE — Telephone Encounter (Signed)
Patient stated that  Medication methimazole (TAPAZOLE) 5 MG tablet is not at the pharmacy. Send to  [id number] - [place], [place] [phone] (Phone) [phone] (Fax)

## 2016-01-04 NOTE — Telephone Encounter (Signed)
Rx sent to pharmacy.  Patient given recommendations.

## 2016-01-11 ENCOUNTER — Telehealth: Payer: Self-pay | Admitting: Family Medicine

## 2016-01-11 NOTE — Telephone Encounter (Signed)
Pt is requesting refill on Alprazolam and Lidocaine patch.  Last OV: 12/13/2015 Last Fill on Alprazolam: 08/11/2015 #30 and 0RF 1 tablet TID PRN Last Fill on Lidocaine patch: 07/07/2015 #30 and 1RF 1 patch q12h    Please advise.

## 2016-01-12 NOTE — Telephone Encounter (Signed)
Rx faxed to [facility].  

## 2016-01-12 NOTE — Telephone Encounter (Signed)
Rx's printed, awaiting DO signature.  

## 2016-01-12 NOTE — Telephone Encounter (Signed)
Refill x1 each 

## 2016-01-13 ENCOUNTER — Other Ambulatory Visit: Payer: Self-pay | Admitting: Internal Medicine

## 2016-01-13 ENCOUNTER — Telehealth: Payer: Self-pay | Admitting: Internal Medicine

## 2016-01-13 MED ORDER — ATENOLOL 25 MG PO TABS: 25.0000 mg | ORAL_TABLET | Freq: Two times a day (BID) | ORAL | Status: DC

## 2016-01-13 NOTE — Telephone Encounter (Signed)
Called pt and advised her per Dr Gherghe's message. Pt voiced understanding.  

## 2016-01-13 NOTE — Telephone Encounter (Signed)
Please read message below and advise.  

## 2016-01-13 NOTE — Telephone Encounter (Signed)
[facility] note dated 01/13/16 7:35 AM  Caller states she was recently dx with hyperthyroidism, has been on new med for a week. She is feeling worse. She wakes up trembling, and keeps on throughout the day. Methamazole. Feels like she feels worse. Shaking worse. Inside feeling nervous, hard to hold coffee cup. Calms down after a few hours. Took .25 xanax no fever.  Feels fatigue, but she feels that way normally. Gets really cold, sweating, anxiety irritability

## 2016-01-13 NOTE — Telephone Encounter (Signed)
Pt is visibly trembling her hands and thru her whole body please help

## 2016-01-13 NOTE — Telephone Encounter (Signed)
I called in a prescription for atenolol 25 mg to be taken twice a day. Please continue the methimazole 5 mg daily.

## 2016-02-01 ENCOUNTER — Telehealth: Payer: Self-pay | Admitting: Family Medicine

## 2016-02-01 NOTE — Telephone Encounter (Signed)
Caller name: Stevette Relationship to patient: self Can be reached: [phone]  Reason for call: pt states that she spoke with the doctor she had seen in the hospital in April. She is concerned with DX of CHF. She is wondering if she needs to see a cardiologist. She has been reading about it and not settled. She is requesting f/u from Dr. Etter Sjogren. She is ok with scheduling appt if needed but wanted call back.

## 2016-02-01 NOTE — Telephone Encounter (Signed)
Please advise      KP 

## 2016-02-01 NOTE — Telephone Encounter (Signed)
VM left advising patient I will call her back tomorrow.     KP

## 2016-02-02 NOTE — Telephone Encounter (Signed)
Message left to call the office.    KP 

## 2016-02-02 NOTE — Telephone Encounter (Signed)
I don't see in chart where they said she had CHF ---- but we do need to do a chest xray per hosp d/c

## 2016-02-02 NOTE — Telephone Encounter (Signed)
Left message for pt to return my call.

## 2016-02-03 ENCOUNTER — Other Ambulatory Visit: Payer: Self-pay | Admitting: Family Medicine

## 2016-02-03 DIAGNOSIS — I5189 Other ill-defined heart diseases: Secondary | ICD-10-CM

## 2016-02-03 NOTE — Telephone Encounter (Signed)
Referral in for cardiology

## 2016-02-03 NOTE — Telephone Encounter (Signed)
Patient wanted to discuss the CHF diagnosis and wanted to have some levels checked as well as had multiple questions. I made her an apt for Monday with Dr.Lowne.   KP

## 2016-02-03 NOTE — Telephone Encounter (Signed)
Dr.Singh advised her on 12/08/15, please review the Echo and advise if she needs a specialist referral.     KP

## 2016-02-06 ENCOUNTER — Ambulatory Visit (INDEPENDENT_AMBULATORY_CARE_PROVIDER_SITE_OTHER): Payer: BC Managed Care – PPO | Admitting: Family Medicine

## 2016-02-06 ENCOUNTER — Encounter: Payer: Self-pay | Admitting: Family Medicine

## 2016-02-06 VITALS — BP 120/78 | HR 68 | Temp 99.0°F | Ht 65.0 in | Wt 221.2 lb

## 2016-02-06 DIAGNOSIS — I519 Heart disease, unspecified: Secondary | ICD-10-CM

## 2016-02-06 DIAGNOSIS — R002 Palpitations: Secondary | ICD-10-CM

## 2016-02-06 DIAGNOSIS — K219 Gastro-esophageal reflux disease without esophagitis: Secondary | ICD-10-CM

## 2016-02-06 LAB — MAGNESIUM: Magnesium: 2 mg/dL (ref 1.5–2.5)

## 2016-02-06 NOTE — Assessment & Plan Note (Signed)
bp control important  No evidence of CHF

## 2016-02-06 NOTE — Progress Notes (Signed)
Subjective:    Patient ID: Emily Osborne, female    DOB: 1961/04/20, 55 y.o.   MRN: [id number]  Chief Complaint  Patient presents with  . Follow-up    wants to discuss possible CHF and magnesium levels    HPI Patient is in today for f/u hospital --- she was told she had chf and is concerned about her magnesium because the symptoms are the same as thyroid.    Past Medical History  Diagnosis Date  . Asthma   . Fibromyalgia   . IBS (irritable bowel syndrome)   . GERD (gastroesophageal reflux disease)   . Hyperlipidemia   . Personal history of other diseases of digestive system   . Other acute reactions to stress   . Pain in joint, upper arm   . Pain in joint, shoulder region   . Elevated blood pressure reading without diagnosis of hypertension     "just elevated when I'm in pain" (12/07/2015)  . Edema   . Pain in limb   . Herpes zoster without mention of complication   . Nontraumatic rupture of Achilles tendon   . Ganglion of joint     right wrist    Past Surgical History  Procedure Laterality Date  . Abdominal hysterectomy    . Tubal ligation    . Bunionectomy Bilateral     Bunionectomy 1983  . Achilles tendon repair Right 2005  . Ganglion cyst excision Right     wrist  . Meniscus repair Right 04/2014    Family History  Problem Relation Age of Onset  . Coronary artery disease Brother   . Hyperlipidemia Mother   . Hypertension Father   . Diabetes Father   . Heart disease Father   . Kidney disease Father     Died, 75  . Leukemia Brother   . Cancer Brother     myleoblastic anemia  . Hypertension Mother     Died, 67  . Thyroid disease Mother     Thyroid surgery  . Pernicious anemia Sister   . Thyroid disease Sister     On thyroid Rx  . Lupus Daughter   . Hypertension Brother     Social History   Social History  . Marital Status: Divorced    Spouse Name: N/A  . Number of Children: 2  . Years of Education: N/A   Occupational History  .  Nurse/teacher    Social History Main Topics  . Smoking status: Never Smoker   . Smokeless tobacco: Never Used  . Alcohol Use: No  . Drug Use: No  . Sexual Activity:    Partners: Male   Other Topics Concern  . Not on file   Social History Narrative   Lives with alone.  She has two daughter.   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 acetaminophen (TYLENOL) 500 MG tablet Take 1,000 mg by mouth every 6 (six) hours as needed for moderate pain.     Marland Kitchen acetic acid (VOSOL) 2 % otic solution Place 4 drops into the left ear 3 (three) times daily. 15 mL 0  . albuterol (PROAIR HFA) 108 (90 Base) MCG/ACT inhaler USE 2 PUFFS EVERY 4 HOURS AS NEEDED FOR COUGH, WHEEZE OR SHORTNESS OF BREATH. 8.5 each 2  . ALPRAZolam (XANAX) 0.25 MG tablet Take 1 tablet (0.25 mg total) by mouth  3 (three) times daily as needed. 30 tablet 1  . atenolol (TENORMIN) 25 MG tablet Take 1 tablet (25 mg total) by mouth 2 (two) times daily. 60 tablet 2  . Blood Glucose Monitoring Suppl (FREESTYLE FREEDOM) KIT 1 Device by Does not apply route daily. 1 each 0  . Calcium Citrate-Vitamin D3 1000-400 LIQD 1 po qd (Patient taking differently: Take 1 tablet by mouth daily. ) 480 mL 5  . cholecalciferol (VITAMIN D) 1000 units tablet Take 2,000 Units by mouth daily.    Marland Kitchen Dexlansoprazole 30 MG capsule Take 1 capsule (30 mg total) by mouth daily. 90 capsule 3  . diclofenac sodium (VOLTAREN) 1 % GEL Apply 2 g topically daily as needed (pain). For pain    . fexofenadine (ALLEGRA) 180 MG tablet Take 1 tablet (180 mg total) by mouth daily. 90 tablet 3  . fluticasone (FLONASE) 50 MCG/ACT nasal spray PLACE 2 SPRAYS INTO BOTH NOSTRILS DAILY. 16 g 4  . Fluticasone-Salmeterol (ADVAIR DISKUS) 250-50 MCG/DOSE AEPB Inhale 1 puff into the lungs 2 (two) times daily. 60 each 2  . FREESTYLE LITE test strip USE TO CHECK BLOOD SUGAR  ONCE A DAY 100 each 6  . Iron Combinations (I.L.X. B-12) ELIX Take 2 drops by mouth daily.    Marland Kitchen lidocaine (LIDODERM) 5 % APPLY 1 PATCH ONTO SKIN EVERY 12 HOURS REMOVE AND DISCARD PATCH WITHIN 12 HOURS OR AS DIRECTED BY MD 30 patch 0  . meclizine (ANTIVERT) 32 MG tablet Take 1 tablet (32 mg total) by mouth 3 (three) times daily as needed for dizziness. 30 tablet 0  . methimazole (TAPAZOLE) 5 MG tablet Take 1 tablet (5 mg total) by mouth daily. 30 tablet 2  . NONFORMULARY OR COMPOUNDED ITEM Compression stockings 20-30 mmhg,  Edema #1 1 each 1  . valACYclovir (VALTREX) 1000 MG tablet TAKE 2 TABLETS BY MOUTH TWICE A DAY FOR 1 DAY AND AS DIRECTED 30 tablet 5  . amoxicillin-clavulanate (AUGMENTIN) 875-125 MG tablet Take 1 tablet by mouth 2 (two) times daily. 28 tablet 0   No facility-administered medications prior to visit.    Allergies  Allergen Reactions  . Gentamicin     Eye drops turned the sclera bright red  . Aspirin     REACTION: anaphylaxis  . Doxycycline     REACTION: severe nausea/vomiting  . Ibuprofen     REACTION: anaphylaxsis  . Influenza A (H1n1) Monoval Vac     REACTION: sick for 3 weeks  . Loratadine Other (See Comments)    Fatigue/weakness  . Metronidazole     REACTION: red face/swelling  . Nsaids     REACTION: anaphylaxis  . Ranitidine Hcl     REACTION: Lips re/peel  . Sulfamethoxazole-Trimethoprim     REACTION: face red/peel  . Tramadol Hcl     REACTION: paranoid    Review of Systems  Constitutional: Negative for fever, chills and malaise/fatigue.  HENT: Negative for congestion and hearing loss.   Eyes: Negative for discharge.  Respiratory: Negative for cough, sputum production and shortness of breath.   Cardiovascular: Negative for chest pain, palpitations and leg swelling.  Gastrointestinal: Negative for heartburn, nausea, vomiting, abdominal pain, diarrhea, constipation and blood in stool.  Genitourinary: Negative for dysuria, urgency, frequency and  hematuria.  Musculoskeletal: Negative for myalgias, back pain and falls.  Skin: Negative for rash.  Neurological: Negative for dizziness, sensory change, loss of consciousness, weakness and headaches.  Endo/Heme/Allergies: Negative for environmental allergies. Does not bruise/bleed easily.  Psychiatric/Behavioral: Negative  for depression and suicidal ideas. The patient is not nervous/anxious and does not have insomnia.        Objective:    Physical Exam  Constitutional: She is oriented to person, place, and time. She appears well-developed and well-nourished.  HENT:  Head: Normocephalic and atraumatic.  Eyes: Conjunctivae and EOM are normal.  Neck: Normal range of motion. Neck supple. No JVD present. Carotid bruit is not present. No thyromegaly present.  Cardiovascular: Normal rate, regular rhythm and normal heart sounds.   No murmur heard. Pulmonary/Chest: Effort normal and breath sounds normal. No respiratory distress. She has no wheezes. She has no rales. She exhibits no tenderness.  Musculoskeletal: She exhibits no edema.  Neurological: She is alert and oriented to person, place, and time.  Psychiatric: She has a normal mood and affect.  Nursing note and vitals reviewed.   BP 120/78 mmHg  Pulse 68  Temp(Src) 99 F (37.2 C) (Oral)  Ht 5' 5"  (1.651 m)  Wt 221 lb 3.2 oz (100.336 kg)  BMI 36.81 kg/m2  SpO2 98% Wt Readings from Last 3 Encounters:  02/06/16 221 lb 3.2 oz (100.336 kg)  12/27/15 221 lb 9.6 oz (100.517 kg)  12/21/15 222 lb 6.4 oz (100.88 kg)     Lab Results  Component Value Date   WBC 5.0 12/14/2015   HGB 12.9 12/14/2015   HCT 38.6 12/14/2015   PLT 330.0 12/14/2015   GLUCOSE 94 12/14/2015   CHOL 174 09/07/2014   TRIG 101.0 09/07/2014   HDL 46.10 09/07/2014   LDLDIRECT 139.4 12/26/2011   LDLCALC 108* 09/07/2014   ALT 23 12/14/2015   AST 19 12/14/2015   NA 141 12/14/2015   K 4.1 12/14/2015   CL 106 12/14/2015   CREATININE 0.79 12/14/2015   BUN 12  12/14/2015   CO2 26 12/14/2015   TSH 0.01* 12/27/2015   HGBA1C 5.9* 12/07/2015    Lab Results  Component Value Date   TSH 0.01* 12/27/2015   Lab Results  Component Value Date   WBC 5.0 12/14/2015   HGB 12.9 12/14/2015   HCT 38.6 12/14/2015   MCV 84.4 12/14/2015   PLT 330.0 12/14/2015   Lab Results  Component Value Date   NA 141 12/14/2015   K 4.1 12/14/2015   CO2 26 12/14/2015   GLUCOSE 94 12/14/2015   BUN 12 12/14/2015   CREATININE 0.79 12/14/2015   BILITOT 0.4 12/14/2015   ALKPHOS 60 12/14/2015   AST 19 12/14/2015   ALT 23 12/14/2015   PROT 7.2 12/14/2015   ALBUMIN 3.9 12/14/2015   CALCIUM 9.5 12/14/2015   ANIONGAP 11 12/08/2015   GFR 96.85 12/14/2015   Lab Results  Component Value Date   CHOL 174 09/07/2014   Lab Results  Component Value Date   HDL 46.10 09/07/2014   Lab Results  Component Value Date   LDLCALC 108* 09/07/2014   Lab Results  Component Value Date   TRIG 101.0 09/07/2014   Lab Results  Component Value Date   CHOLHDL 4 09/07/2014   Lab Results  Component Value Date   HGBA1C 5.9* 12/07/2015       Assessment & Plan:   Problem List Items Addressed This Visit    GERD   Mild diastolic dysfunction    bp control important  No evidence of CHF       Palpitations - Primary    On atenolol Secondary to hyperthyroidism Per endo      Relevant Orders   Magnesium  I have discontinued Ms. Guillen's amoxicillin-clavulanate. I am also having her maintain her acetaminophen, NONFORMULARY OR COMPOUNDED ITEM, valACYclovir, fexofenadine, Calcium Citrate-Vitamin D3, FREESTYLE FREEDOM, cholecalciferol, albuterol, diclofenac sodium, meclizine, fluticasone, I.L.X. B-12, Fluticasone-Salmeterol, acetic acid, Dexlansoprazole, methimazole, lidocaine, ALPRAZolam, FREESTYLE LITE, and atenolol.  No orders of the defined types were placed in this encounter.     [name], DO

## 2016-02-06 NOTE — Patient Instructions (Signed)
Food Choices for Gastroesophageal Reflux Disease, Adult When you have gastroesophageal reflux disease (GERD), the foods you eat and your eating habits are very important. Choosing the right foods can help ease the discomfort of GERD. WHAT GENERAL GUIDELINES DO I NEED TO FOLLOW?  Choose fruits, vegetables, whole grains, low-fat dairy products, and low-fat meat, fish, and poultry.  Limit fats such as oils, salad dressings, butter, nuts, and avocado.  Keep a food diary to identify foods that cause symptoms.  Avoid foods that cause reflux. These may be different for different people.  Eat frequent small meals instead of three large meals each day.  Eat your meals slowly, in a relaxed setting.  Limit fried foods.  Cook foods using methods other than frying.  Avoid drinking alcohol.  Avoid drinking large amounts of liquids with your meals.  Avoid bending over or lying down until 2-3 hours after eating. WHAT FOODS ARE NOT RECOMMENDED? The following are some foods and drinks that may worsen your symptoms: Vegetables Tomatoes. Tomato juice. Tomato and spaghetti sauce. Chili peppers. Onion and garlic. Horseradish. Fruits Oranges, grapefruit, and lemon (fruit and juice). Meats High-fat meats, fish, and poultry. This includes hot dogs, ribs, ham, sausage, salami, and bacon. Dairy Whole milk and chocolate milk. Sour cream. Cream. Butter. Ice cream. Cream cheese.  Beverages Coffee and tea, with or without caffeine. Carbonated beverages or energy drinks. Condiments Hot sauce. Barbecue sauce.  Sweets/Desserts Chocolate and cocoa. Donuts. Peppermint and spearmint. Fats and Oils High-fat foods, including French fries and potato chips. Other Vinegar. Strong spices, such as black pepper, white pepper, red pepper, cayenne, curry powder, cloves, ginger, and chili powder. The items listed above may not be a complete list of foods and beverages to avoid. Contact your dietitian for more  information.   This information is not intended to replace advice given to you by your health care provider. Make sure you discuss any questions you have with your health care provider.   Document Released: 08/06/2005 Document Revised: 08/27/2014 Document Reviewed: 06/10/2013 Elsevier Interactive Patient Education 2016 Elsevier Inc.  

## 2016-02-06 NOTE — Progress Notes (Signed)
Pre visit review using our clinic review tool, if applicable. No additional management support is needed unless otherwise documented below in the visit note. 

## 2016-02-06 NOTE — Addendum Note (Signed)
Addended by: Peggyann Shoals on: 02/06/2016 10:08 AM   Modules accepted: Orders

## 2016-02-06 NOTE — Assessment & Plan Note (Signed)
On atenolol Secondary to hyperthyroidism Per endo

## 2016-02-07 NOTE — Progress Notes (Signed)
Quick Note:  Pt has seen results on MyChart and message also sent for patient to call back if any questions. ______ 

## 2016-02-15 ENCOUNTER — Encounter: Payer: Self-pay | Admitting: Family Medicine

## 2016-03-19 ENCOUNTER — Telehealth: Payer: Self-pay

## 2016-03-19 MED ORDER — ALPRAZOLAM 0.25 MG PO TABS: 0.2500 mg | ORAL_TABLET | Freq: Three times a day (TID) | ORAL | 1 refills | Status: DC | PRN

## 2016-03-19 MED ORDER — VALACYCLOVIR HCL 1 G PO TABS: 1000.0000 mg | ORAL_TABLET | Freq: Three times a day (TID) | ORAL | 5 refills | Status: DC

## 2016-03-19 NOTE — Telephone Encounter (Signed)
Last seen 02/06/16 and filled 01/12/16 #30 with 1 refill    Please advise    KP

## 2016-03-19 NOTE — Telephone Encounter (Signed)
Patient requesting refill for valcyclovir and alprozolam she is completely out and needs enough to last until appointment Friday [facility]  Patient can be reached at [phone]

## 2016-03-20 NOTE — Telephone Encounter (Addendum)
°  Relation to [id number] Call back number:[phone] [id number] [place], [place] [phone] (Fax)     Reason for call:  Patient states she spoke with pharmacy today and prescription listed below pharmacy states never received.

## 2016-03-20 NOTE — Telephone Encounter (Signed)
I spoke with the pharmacy and the patient has picked up the med's, she was trying to confirm the new refills with an old Rx number.    KP

## 2016-03-21 ENCOUNTER — Other Ambulatory Visit: Payer: Self-pay | Admitting: Internal Medicine

## 2016-03-21 NOTE — Telephone Encounter (Signed)
Patient need a 90 day supply for Atenolol [id number] [place], [place] - [place] [phone] (Phone) [phone] (Fax)

## 2016-03-22 ENCOUNTER — Other Ambulatory Visit: Payer: Self-pay

## 2016-03-22 MED ORDER — METHIMAZOLE 5 MG PO TABS: 5.0000 mg | ORAL_TABLET | Freq: Every day | ORAL | 2 refills | Status: DC

## 2016-03-23 ENCOUNTER — Ambulatory Visit (INDEPENDENT_AMBULATORY_CARE_PROVIDER_SITE_OTHER): Payer: BC Managed Care – PPO | Admitting: Family Medicine

## 2016-03-23 ENCOUNTER — Other Ambulatory Visit: Payer: Self-pay

## 2016-03-23 ENCOUNTER — Telehealth: Payer: Self-pay | Admitting: Family Medicine

## 2016-03-23 ENCOUNTER — Encounter: Payer: Self-pay | Admitting: Family Medicine

## 2016-03-23 ENCOUNTER — Other Ambulatory Visit: Payer: Self-pay | Admitting: Family Medicine

## 2016-03-23 VITALS — BP 114/84 | HR 76 | Temp 98.0°F | Ht 65.0 in | Wt 227.0 lb

## 2016-03-23 DIAGNOSIS — K120 Recurrent oral aphthae: Secondary | ICD-10-CM

## 2016-03-23 DIAGNOSIS — Z1159 Encounter for screening for other viral diseases: Secondary | ICD-10-CM | POA: Diagnosis not present

## 2016-03-23 DIAGNOSIS — Z1231 Encounter for screening mammogram for malignant neoplasm of breast: Secondary | ICD-10-CM

## 2016-03-23 DIAGNOSIS — R5381 Other malaise: Secondary | ICD-10-CM | POA: Diagnosis not present

## 2016-03-23 DIAGNOSIS — Z0000 Encounter for general adult medical examination without abnormal findings: Secondary | ICD-10-CM

## 2016-03-23 DIAGNOSIS — R5383 Other fatigue: Secondary | ICD-10-CM | POA: Diagnosis not present

## 2016-03-23 LAB — LIPID PANEL
Cholesterol: 201 mg/dL — ABNORMAL HIGH (ref 125–200)
HDL: 54 mg/dL (ref >=46)
LDL Cholesterol: 111 mg/dL (ref <130)
Total CHOL/HDL Ratio: 3.7 ratio (ref <=5.0)
Triglycerides: 180 mg/dL — ABNORMAL HIGH (ref <150)
VLDL: 36 mg/dL — ABNORMAL HIGH (ref <30)

## 2016-03-23 LAB — POCT URINALYSIS DIPSTICK
Bilirubin, UA: NEGATIVE
Blood, UA: NEGATIVE
Glucose, UA: NEGATIVE
Ketones, UA: NEGATIVE
LEUKOCYTES UA: NEGATIVE
NITRITE UA: NEGATIVE
PROTEIN UA: NEGATIVE
Spec Grav, UA: >=1.03
UROBILINOGEN UA: 0.2
pH, UA: 6

## 2016-03-23 LAB — TSH: TSH: 0.9 mIU/L

## 2016-03-23 LAB — CBC WITH DIFFERENTIAL/PLATELET
BASOS ABS: 0 {cells}/uL (ref 0–200)
Basophils Relative: 0 %
EOS PCT: 2 %
Eosinophils Absolute: 116 cells/uL (ref 15–500)
HEMATOCRIT: 43.4 % (ref 35.0–45.0)
HEMOGLOBIN: 14 g/dL (ref 11.7–15.5)
LYMPHS ABS: 2958 {cells}/uL (ref 850–3900)
Lymphocytes Relative: 51 %
MCH: 27.7 pg (ref 27.0–33.0)
MCHC: 32.3 g/dL (ref 32.0–36.0)
MCV: 85.9 fL (ref 80.0–100.0)
MONO ABS: 522 {cells}/uL (ref 200–950)
MPV: 8.9 fL (ref 7.5–12.5)
Monocytes Relative: 9 %
NEUTROS ABS: 2204 {cells}/uL (ref 1500–7800)
NEUTROS PCT: 38 %
Platelets: 257 10*3/uL (ref 140–400)
RBC: 5.05 MIL/uL (ref 3.80–5.10)
RDW: 15.9 % — ABNORMAL HIGH (ref 11.0–15.0)
WBC: 5.8 10*3/uL (ref 3.8–10.8)

## 2016-03-23 LAB — COMPREHENSIVE METABOLIC PANEL WITH GFR
ALT: 27 U/L (ref 6–29)
AST: 23 U/L (ref 10–35)
Albumin: 4.4 g/dL (ref 3.6–5.1)
Alkaline Phosphatase: 76 U/L (ref 33–130)
BUN: 10 mg/dL (ref 7–25)
CO2: 28 mmol/L (ref 20–31)
Calcium: 9.5 mg/dL (ref 8.6–10.4)
Chloride: 101 mmol/L (ref 98–110)
Creat: 0.97 mg/dL (ref 0.50–1.05)
Glucose, Bld: 78 mg/dL (ref 65–99)
Potassium: 4.3 mmol/L (ref 3.5–5.3)
Sodium: 141 mmol/L (ref 135–146)
Total Bilirubin: 0.5 mg/dL (ref 0.2–1.2)
Total Protein: 7.2 g/dL (ref 6.1–8.1)

## 2016-03-23 LAB — VITAMIN B12: Vitamin B-12: 609 pg/mL (ref 200–1100)

## 2016-03-23 MED ORDER — TRIAMCINOLONE ACETONIDE 0.1 % EX CREA: 1.0000 "application " | TOPICAL_CREAM | Freq: Two times a day (BID) | CUTANEOUS | 0 refills | Status: DC

## 2016-03-23 MED ORDER — ATENOLOL 25 MG PO TABS: 25.0000 mg | ORAL_TABLET | Freq: Two times a day (BID) | ORAL | 2 refills | Status: DC

## 2016-03-23 MED ORDER — TRIAMCINOLONE ACETONIDE 0.1 % MT PSTE: PASTE | Freq: Two times a day (BID) | OROMUCOSAL | Status: DC

## 2016-03-23 NOTE — Telephone Encounter (Signed)
Relation to [id number] Call back number:[phone] [facility]acy: [id number] [place], [place] [phone] (Phone) [phone])     Reason for call:  Patient spoke with pharmacy today and they never received triamcinolone (KENALOG) 0.1 % paste

## 2016-03-23 NOTE — Progress Notes (Signed)
Subjective:     Emily Osborne is a 55 y.o. female and is here for a comprehensive physical exam. The patient reports problems - pt feeling tired and having trouble sleeping.  Social History   Social History  . Marital status: Divorced    Spouse name: N/A  . Number of children: 2  . Years of education: N/A   Occupational History  . Nurse/teacher    Social History Main Topics  . Smoking status: Never Smoker  . Smokeless tobacco: Never Used  . Alcohol use No  . Drug use: No  . Sexual activity: Not Currently    Partners: Male   Other Topics Concern  . Not on file   Social History Narrative   Lives with alone.  She has two daughter.   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 Hepatitis C Screening  03/23/2017 (Originally [birth year])  . HIV Screening  03/23/2017 (Originally 01[birth year]18/1975)  . MAMMOGRAM  11/01/2016  . PAP SMEAR  09/07/2017  . COLONOSCOPY  09/29/2018  . TETANUS/TDAP  09/07/2020    The following portions of the patient's history were reviewed and updated as appropriate: She  has a past medical history of Asthma; Edema; Elevated blood pressure reading without diagnosis of hypertension; Fibromyalgia; Ganglion of joint; GERD (gastroesophageal reflux disease); Herpes zoster without mention of complication; Hyperlipidemia; IBS (irritable bowel syndrome); Nontraumatic rupture of Achilles tendon; Other acute reactions to stress; Pain in joint, shoulder region; Pain in joint, upper arm; Pain in limb; and Personal history of other diseases of digestive system. She  does not have any pertinent problems on file. She  has a past surgical history that includes Abdominal hysterectomy; Tubal ligation; Bunionectomy (Bilateral); Achilles tendon repair (Right, 2005); Ganglion cyst excision (Right); and Meniscus repair (Right, 04/2014). Her family history includes Cancer in her brother; Coronary artery  disease in her brother; Diabetes in her father; Heart disease in her father; Hyperlipidemia in her mother; Hypertension in her brother, father, and mother; Kidney disease in her father; Leukemia in her brother; Lupus in her daughter; Pernicious anemia in her sister; Thyroid disease in her mother and sister. She  reports that she has never smoked. She has never used smokeless tobacco. She reports that she does not drink alcohol or use drugs. She has a current medication list which includes the following prescription(s): acetaminophen, albuterol, alprazolam, atenolol, freestyle freedom, calcium citrate-vitamin d3, cholecalciferol, dexlansoprazole, diclofenac sodium, fexofenadine, fluticasone, fluticasone-salmeterol, freestyle lite, i.l.x. b-12, lidocaine, meclizine, methimazole, and valacyclovir. Current Outpatient Prescriptions on File Prior to Visit  Medication Sig Dispense Refill  . acetaminophen (TYLENOL) 500 MG tablet Take 1,000 mg by mouth every 6 (six) hours as needed for moderate pain.     Marland Kitchen albuterol (PROAIR HFA) 108 (90 Base) MCG/ACT inhaler USE 2 PUFFS EVERY 4 HOURS AS NEEDED FOR COUGH, WHEEZE OR SHORTNESS OF BREATH. 8.5 each 2  . ALPRAZolam (XANAX) 0.25 MG tablet Take 1 tablet (0.25 mg total) by mouth 3 (three) times daily as needed. 30 tablet 1  . Blood Glucose Monitoring Suppl (FREESTYLE FREEDOM) KIT 1 Device by Does not apply route daily. 1 each 0  . Calcium Citrate-Vitamin D3 1000-400 LIQD 1 po qd (Patient taking differently: Take 1 tablet by mouth daily. ) 480 mL 5  . cholecalciferol (VITAMIN D) 1000 units tablet Take 2,000 Units by mouth daily.    Marland Kitchen Dexlansoprazole 30 MG capsule Take  1 capsule (30 mg total) by mouth daily. 90 capsule 3  . diclofenac sodium (VOLTAREN) 1 % GEL Apply 2 g topically daily as needed (pain). For pain    . fexofenadine (ALLEGRA) 180 MG tablet Take 1 tablet (180 mg total) by mouth daily. 90 tablet 3  . fluticasone (FLONASE) 50 MCG/ACT nasal spray PLACE 2 SPRAYS  INTO BOTH NOSTRILS DAILY. 16 g 4  . Fluticasone-Salmeterol (ADVAIR DISKUS) 250-50 MCG/DOSE AEPB Inhale 1 puff into the lungs 2 (two) times daily. 60 each 2  . FREESTYLE LITE test strip USE TO CHECK BLOOD SUGAR ONCE A DAY 100 each 6  . Iron Combinations (I.L.X. B-12) ELIX Take 2 drops by mouth daily.    Marland Kitchen lidocaine (LIDODERM) 5 % APPLY 1 PATCH ONTO SKIN EVERY 12 HOURS REMOVE AND DISCARD PATCH WITHIN 12 HOURS OR AS DIRECTED BY MD 30 patch 0  . meclizine (ANTIVERT) 32 MG tablet Take 1 tablet (32 mg total) by mouth 3 (three) times daily as needed for dizziness. 30 tablet 0  . methimazole (TAPAZOLE) 5 MG tablet Take 1 tablet (5 mg total) by mouth daily. 30 tablet 2  . valACYclovir (VALTREX) 1000 MG tablet Take 1 tablet (1,000 mg total) by mouth 3 (three) times daily. 30 tablet 5   No current facility-administered medications on file prior to visit.    She is allergic to gentamicin; aspirin; doxycycline; ibuprofen; influenza a (h1n1) monoval vac; loratadine; metronidazole; nsaids; ranitidine hcl; sulfamethoxazole-trimethoprim; and tramadol hcl..  Review of Systems Review of Systems  Constitutional: Negative for activity change, appetite change and fatigue.  HENT: Negative for hearing loss, congestion, tinnitus and ear discharge.  dentist q46mEyes: Negative for visual disturbance (see optho q1y -- vision corrected to 20/20 with glasses).  Respiratory: Negative for cough, chest tightness and shortness of breath.   Cardiovascular: Negative for chest pain, palpitations and leg swelling.  Gastrointestinal: Negative for abdominal pain, diarrhea, constipation and abdominal distention.  Genitourinary: Negative for urgency, frequency, decreased urine volume and difficulty urinating.  Musculoskeletal: Negative for back pain, arthralgias and gait problem.  Skin: Negative for color change, pallor and rash.  Neurological: Negative for dizziness, light-headedness, numbness and headaches.  Hematological:  Negative for adenopathy. Does not bruise/bleed easily.  Psychiatric/Behavioral: Negative for suicidal ideas, confusion, sleep disturbance, self-injury, dysphoric mood, decreased concentration and agitation.       Objective:    BP 114/84 (BP Location: Left Arm, Patient Position: Sitting, Cuff Size: Normal)   Pulse 76   Temp 98 F (36.7 C) (Oral)   Ht 5' 5"  (1.651 m)   Wt 227 lb (103 kg)   SpO2 98%   BMI 37.77 kg/m  General appearance: alert, cooperative, appears stated age and no distress Head: Normocephalic, without obvious abnormality, atraumatic Eyes: conjunctivae/corneas clear. PERRL, EOM's intact. Fundi benign. Ears: normal TM's and external ear canals both ears Nose: Nares normal. Septum midline. Mucosa normal. No drainage or sinus tenderness. Throat: lips, mucosa, and tongue normal; teeth and gums normal Neck: no adenopathy, no carotid bruit, no JVD, supple, symmetrical, trachea midline and thyroid not enlarged, symmetric, no tenderness/mass/nodules Back: symmetric, no curvature. ROM normal. No CVA tenderness. Lungs: clear to auscultation bilaterally Breasts: normal appearance, no masses or tenderness Heart: regular rate and rhythm, S1, S2 normal, no murmur, click, rub or gallop Abdomen: soft, non-tender; bowel sounds normal; no masses,  no organomegaly Pelvic: not indicated; status post hysterectomy, negative ROS Extremities: extremities normal, atraumatic, no cyanosis or edema Pulses: 2+ and symmetric Skin: Skin color,  texture, turgor normal. No rashes or lesions Lymph nodes: Cervical, supraclavicular, and axillary nodes normal. Neurologic: Alert and oriented X 3, normal strength and tone. Normal symmetric reflexes. Normal coordination and gait    Assessment:    Healthy female exam.      Plan:    ghm utd Check labs See After Visit Summary for Counseling Recommendations    1. Preventative health care See above - CBC with Differential/Platelet - Lipid panel -  POCT urinalysis dipstick - TSH - Vitamin B12 - Comprehensive metabolic panel - Hepatitis C antibody  2. Need for hepatitis C screening test  - Hepatitis C antibody  3. Malaise and fatigue Check labs-- hx b12 def - CBC with Differential/Platelet - Lipid panel - POCT urinalysis dipstick - TSH - Vitamin B12 - Comprehensive metabolic panel

## 2016-03-23 NOTE — Patient Instructions (Signed)
Preventive Care for Adults, Female A healthy lifestyle and preventive care can promote health and wellness. Preventive health guidelines for women include the following key practices.  A routine yearly physical is a good way to check with your health care provider about your health and preventive screening. It is a chance to share any concerns and updates on your health and to receive a thorough exam.  Visit your dentist for a routine exam and preventive care every 6 months. Brush your teeth twice a day and floss once a day. Good oral hygiene prevents tooth decay and gum disease.  The frequency of eye exams is based on your age, health, family medical history, use of contact lenses, and other factors. Follow your health care provider's recommendations for frequency of eye exams.  Eat a healthy diet. Foods like vegetables, fruits, whole grains, low-fat dairy products, and lean protein foods contain the nutrients you need without too many calories. Decrease your intake of foods high in solid fats, added sugars, and salt. Eat the right amount of calories for you.Get information about a proper diet from your health care provider, if necessary.  Regular physical exercise is one of the most important things you can do for your health. Most adults should get at least 150 minutes of moderate-intensity exercise (any activity that increases your heart rate and causes you to sweat) each week. In addition, most adults need muscle-strengthening exercises on 2 or more days a week.  Maintain a healthy weight. The body mass index (BMI) is a screening tool to identify possible weight problems. It provides an estimate of body fat based on height and weight. Your health care provider can find your BMI and can help you achieve or maintain a healthy weight.For adults 20 years and older:  A BMI below 18.5 is considered underweight.  A BMI of 18.5 to 24.9 is normal.  A BMI of 25 to 29.9 is considered overweight.  A  BMI of 30 and above is considered obese.  Maintain normal blood lipids and cholesterol levels by exercising and minimizing your intake of saturated fat. Eat a balanced diet with plenty of fruit and vegetables. Blood tests for lipids and cholesterol should begin at age 45 and be repeated every 5 years. If your lipid or cholesterol levels are high, you are over 50, or you are at high risk for heart disease, you may need your cholesterol levels checked more frequently.Ongoing high lipid and cholesterol levels should be treated with medicines if diet and exercise are not working.  If you smoke, find out from your health care provider how to quit. If you do not use tobacco, do not start.  Lung cancer screening is recommended for adults aged 45-80 years who are at high risk for developing lung cancer because of a history of smoking. A yearly low-dose CT scan of the lungs is recommended for people who have at least a 30-pack-year history of smoking and are a current smoker or have quit within the past 15 years. A pack year of smoking is smoking an average of 1 pack of cigarettes a day for 1 year (for example: 1 pack a day for 30 years or 2 packs a day for 15 years). Yearly screening should continue until the smoker has stopped smoking for at least 15 years. Yearly screening should be stopped for people who develop a health problem that would prevent them from having lung cancer treatment.  If you are pregnant, do not drink alcohol. If you are  breastfeeding, be very cautious about drinking alcohol. If you are not pregnant and choose to drink alcohol, do not have more than 1 drink per day. One drink is considered to be 12 ounces (355 mL) of beer, 5 ounces (148 mL) of wine, or 1.5 ounces (44 mL) of liquor.  Avoid use of street drugs. Do not share needles with anyone. Ask for help if you need support or instructions about stopping the use of drugs.  High blood pressure causes heart disease and increases the risk  of stroke. Your blood pressure should be checked at least every 1 to 2 years. Ongoing high blood pressure should be treated with medicines if weight loss and exercise do not work.  If you are 55-79 years old, ask your health care provider if you should take aspirin to prevent strokes.  Diabetes screening is done by taking a blood sample to check your blood glucose level after you have not eaten for a certain period of time (fasting). If you are not overweight and you do not have risk factors for diabetes, you should be screened once every 3 years starting at age 45. If you are overweight or obese and you are 40-70 years of age, you should be screened for diabetes every year as part of your cardiovascular risk assessment.  Breast cancer screening is essential preventive care for women. You should practice "breast self-awareness." This means understanding the normal appearance and feel of your breasts and may include breast self-examination. Any changes detected, no matter how small, should be reported to a health care provider. Women in their 20s and 30s should have a clinical breast exam (CBE) by a health care provider as part of a regular health exam every 1 to 3 years. After age 40, women should have a CBE every year. Starting at age 40, women should consider having a mammogram (breast X-ray test) every year. Women who have a family history of breast cancer should talk to their health care provider about genetic screening. Women at a high risk of breast cancer should talk to their health care providers about having an MRI and a mammogram every year.  Breast cancer gene (BRCA)-related cancer risk assessment is recommended for women who have family members with BRCA-related cancers. BRCA-related cancers include breast, ovarian, tubal, and peritoneal cancers. Having family members with these cancers may be associated with an increased risk for harmful changes (mutations) in the breast cancer genes BRCA1 and  BRCA2. Results of the assessment will determine the need for genetic counseling and BRCA1 and BRCA2 testing.  Your health care provider may recommend that you be screened regularly for cancer of the pelvic organs (ovaries, uterus, and vagina). This screening involves a pelvic examination, including checking for microscopic changes to the surface of your cervix (Pap test). You may be encouraged to have this screening done every 3 years, beginning at age 21.  For women ages 30-65, health care providers may recommend pelvic exams and Pap testing every 3 years, or they may recommend the Pap and pelvic exam, combined with testing for human papilloma virus (HPV), every 5 years. Some types of HPV increase your risk of cervical cancer. Testing for HPV may also be done on women of any age with unclear Pap test results.  Other health care providers may not recommend any screening for nonpregnant women who are considered low risk for pelvic cancer and who do not have symptoms. Ask your health care provider if a screening pelvic exam is right for   you.  If you have had past treatment for cervical cancer or a condition that could lead to cancer, you need Pap tests and screening for cancer for at least 20 years after your treatment. If Pap tests have been discontinued, your risk factors (such as having a new sexual partner) need to be reassessed to determine if screening should resume. Some women have medical problems that increase the chance of getting cervical cancer. In these cases, your health care provider may recommend more frequent screening and Pap tests.  Colorectal cancer can be detected and often prevented. Most routine colorectal cancer screening begins at the age of 50 years and continues through age 75 years. However, your health care provider may recommend screening at an earlier age if you have risk factors for colon cancer. On a yearly basis, your health care provider may provide home test kits to check  for hidden blood in the stool. Use of a small camera at the end of a tube, to directly examine the colon (sigmoidoscopy or colonoscopy), can detect the earliest forms of colorectal cancer. Talk to your health care provider about this at age 50, when routine screening begins. Direct exam of the colon should be repeated every 5-10 years through age 75 years, unless early forms of precancerous polyps or small growths are found.  People who are at an increased risk for hepatitis B should be screened for this virus. You are considered at high risk for hepatitis B if:  You were born in a country where hepatitis B occurs often. Talk with your health care provider about which countries are considered high risk.  Your parents were born in a high-risk country and you have not received a shot to protect against hepatitis B (hepatitis B vaccine).  You have HIV or AIDS.  You use needles to inject street drugs.  You live with, or have sex with, someone who has hepatitis B.  You get hemodialysis treatment.  You take certain medicines for conditions like cancer, organ transplantation, and autoimmune conditions.  Hepatitis C blood testing is recommended for all people born from 1945 through 1965 and any individual with known risks for hepatitis C.  Practice safe sex. Use condoms and avoid high-risk sexual practices to reduce the spread of sexually transmitted infections (STIs). STIs include gonorrhea, chlamydia, syphilis, trichomonas, herpes, HPV, and human immunodeficiency virus (HIV). Herpes, HIV, and HPV are viral illnesses that have no cure. They can result in disability, cancer, and death.  You should be screened for sexually transmitted illnesses (STIs) including gonorrhea and chlamydia if:  You are sexually active and are younger than 24 years.  You are older than 24 years and your health care provider tells you that you are at risk for this type of infection.  Your sexual activity has changed  since you were last screened and you are at an increased risk for chlamydia or gonorrhea. Ask your health care provider if you are at risk.  If you are at risk of being infected with HIV, it is recommended that you take a prescription medicine daily to prevent HIV infection. This is called preexposure prophylaxis (PrEP). You are considered at risk if:  You are sexually active and do not regularly use condoms or know the HIV status of your partner(s).  You take drugs by injection.  You are sexually active with a partner who has HIV.  Talk with your health care provider about whether you are at high risk of being infected with HIV. If   you choose to begin PrEP, you should first be tested for HIV. You should then be tested every 3 months for as long as you are taking PrEP.  Osteoporosis is a disease in which the bones lose minerals and strength with aging. This can result in serious bone fractures or breaks. The risk of osteoporosis can be identified using a bone density scan. Women ages 67 years and over and women at risk for fractures or osteoporosis should discuss screening with their health care providers. Ask your health care provider whether you should take a calcium supplement or vitamin D to reduce the rate of osteoporosis.  Menopause can be associated with physical symptoms and risks. Hormone replacement therapy is available to decrease symptoms and risks. You should talk to your health care provider about whether hormone replacement therapy is right for you.  Use sunscreen. Apply sunscreen liberally and repeatedly throughout the day. You should seek shade when your shadow is shorter than you. Protect yourself by wearing long sleeves, pants, a wide-brimmed hat, and sunglasses year round, whenever you are outdoors.  Once a month, do a whole body skin exam, using a mirror to look at the skin on your back. Tell your health care provider of new moles, moles that have irregular borders, moles that  are larger than a pencil eraser, or moles that have changed in shape or color.  Stay current with required vaccines (immunizations).  Influenza vaccine. All adults should be immunized every year.  Tetanus, diphtheria, and acellular pertussis (Td, Tdap) vaccine. Pregnant women should receive 1 dose of Tdap vaccine during each pregnancy. The dose should be obtained regardless of the length of time since the last dose. Immunization is preferred during the 27th-36th week of gestation. An adult who has not previously received Tdap or who does not know her vaccine status should receive 1 dose of Tdap. This initial dose should be followed by tetanus and diphtheria toxoids (Td) booster doses every 10 years. Adults with an unknown or incomplete history of completing a 3-dose immunization series with Td-containing vaccines should begin or complete a primary immunization series including a Tdap dose. Adults should receive a Td booster every 10 years.  Varicella vaccine. An adult without evidence of immunity to varicella should receive 2 doses or a second dose if she has previously received 1 dose. Pregnant females who do not have evidence of immunity should receive the first dose after pregnancy. This first dose should be obtained before leaving the health care facility. The second dose should be obtained 4-8 weeks after the first dose.  Human papillomavirus (HPV) vaccine. Females aged 13-26 years who have not received the vaccine previously should obtain the 3-dose series. The vaccine is not recommended for use in pregnant females. However, pregnancy testing is not needed before receiving a dose. If a female is found to be pregnant after receiving a dose, no treatment is needed. In that case, the remaining doses should be delayed until after the pregnancy. Immunization is recommended for any person with an immunocompromised condition through the age of 61 years if she did not get any or all doses earlier. During the  3-dose series, the second dose should be obtained 4-8 weeks after the first dose. The third dose should be obtained 24 weeks after the first dose and 16 weeks after the second dose.  Zoster vaccine. One dose is recommended for adults aged 30 years or older unless certain conditions are present.  Measles, mumps, and rubella (MMR) vaccine. Adults born  before 1957 generally are considered immune to measles and mumps. Adults born in 1957 or later should have 1 or more doses of MMR vaccine unless there is a contraindication to the vaccine or there is laboratory evidence of immunity to each of the three diseases. A routine second dose of MMR vaccine should be obtained at least 28 days after the first dose for students attending postsecondary schools, health care workers, or international travelers. People who received inactivated measles vaccine or an unknown type of measles vaccine during 1963-1967 should receive 2 doses of MMR vaccine. People who received inactivated mumps vaccine or an unknown type of mumps vaccine before 1979 and are at high risk for mumps infection should consider immunization with 2 doses of MMR vaccine. For females of childbearing age, rubella immunity should be determined. If there is no evidence of immunity, females who are not pregnant should be vaccinated. If there is no evidence of immunity, females who are pregnant should delay immunization until after pregnancy. Unvaccinated health care workers born before 1957 who lack laboratory evidence of measles, mumps, or rubella immunity or laboratory confirmation of disease should consider measles and mumps immunization with 2 doses of MMR vaccine or rubella immunization with 1 dose of MMR vaccine.  Pneumococcal 13-valent conjugate (PCV13) vaccine. When indicated, a person who is uncertain of his immunization history and has no record of immunization should receive the PCV13 vaccine. All adults 65 years of age and older should receive this  vaccine. An adult aged 19 years or older who has certain medical conditions and has not been previously immunized should receive 1 dose of PCV13 vaccine. This PCV13 should be followed with a dose of pneumococcal polysaccharide (PPSV23) vaccine. Adults who are at high risk for pneumococcal disease should obtain the PPSV23 vaccine at least 8 weeks after the dose of PCV13 vaccine. Adults older than 56 years of age who have normal immune system function should obtain the PPSV23 vaccine dose at least 1 year after the dose of PCV13 vaccine.  Pneumococcal polysaccharide (PPSV23) vaccine. When PCV13 is also indicated, PCV13 should be obtained first. All adults aged 65 years and older should be immunized. An adult younger than age 65 years who has certain medical conditions should be immunized. Any person who resides in a nursing home or long-term care facility should be immunized. An adult smoker should be immunized. People with an immunocompromised condition and certain other conditions should receive both PCV13 and PPSV23 vaccines. People with human immunodeficiency virus (HIV) infection should be immunized as soon as possible after diagnosis. Immunization during chemotherapy or radiation therapy should be avoided. Routine use of PPSV23 vaccine is not recommended for American Indians, [place] Natives, or people younger than 65 years unless there are medical conditions that require PPSV23 vaccine. When indicated, people who have unknown immunization and have no record of immunization should receive PPSV23 vaccine. One-time revaccination 5 years after the first dose of PPSV23 is recommended for people aged 19-64 years who have chronic kidney failure, nephrotic syndrome, asplenia, or immunocompromised conditions. People who received 1-2 doses of PPSV23 before age 65 years should receive another dose of PPSV23 vaccine at age 65 years or later if at least 5 years have passed since the previous dose. Doses of PPSV23 are not  needed for people immunized with PPSV23 at or after age 65 years.  Meningococcal vaccine. Adults with asplenia or persistent complement component deficiencies should receive 2 doses of quadrivalent meningococcal conjugate (MenACWY-D) vaccine. The doses should be obtained   at least 2 months apart. Microbiologists working with certain meningococcal bacteria, [place] recruits, people at risk during an outbreak, and people who travel to or live in countries with a high rate of meningitis should be immunized. A first-year college student up through age 34 years who is living in a residence hall should receive a dose if she did not receive a dose on or after her 16th birthday. Adults who have certain high-risk conditions should receive one or more doses of vaccine.  Hepatitis A vaccine. Adults who wish to be protected from this disease, have certain high-risk conditions, work with hepatitis A-infected animals, work in hepatitis A research labs, or travel to or work in countries with a high rate of hepatitis A should be immunized. Adults who were previously unvaccinated and who anticipate close contact with an international adoptee during the first 60 days after arrival in the [place] from a country with a high rate of hepatitis A should be immunized.  Hepatitis B vaccine. Adults who wish to be protected from this disease, have certain high-risk conditions, may be exposed to blood or other infectious body fluids, are household contacts or sex partners of hepatitis B positive people, are clients or workers in certain care facilities, or travel to or work in countries with a high rate of hepatitis B should be immunized.  Haemophilus influenzae type b (Hib) vaccine. A previously unvaccinated person with asplenia or sickle cell disease or having a scheduled splenectomy should receive 1 dose of Hib vaccine. Regardless of previous immunization, a recipient of a hematopoietic stem cell transplant should receive a  3-dose series 6-12 months after her successful transplant. Hib vaccine is not recommended for adults with HIV infection. Preventive Services / Frequency Ages 35 to 4 years  Blood pressure check.** / Every 3-5 years.  Lipid and cholesterol check.** / Every 5 years beginning at age 60.  Clinical breast exam.** / Every 3 years for women in their 71s and 10s.  BRCA-related cancer risk assessment.** / For women who have family members with a BRCA-related cancer (breast, ovarian, tubal, or peritoneal cancers).  Pap test.** / Every 2 years from ages 76 through 26. Every 3 years starting at age 61 through age 76 or 93 with a history of 3 consecutive normal Pap tests.  HPV screening.** / Every 3 years from ages 37 through ages 60 to 51 with a history of 3 consecutive normal Pap tests.  Hepatitis C blood test.** / For any individual with known risks for hepatitis C.  Skin self-exam. / Monthly.  Influenza vaccine. / Every year.  Tetanus, diphtheria, and acellular pertussis (Tdap, Td) vaccine.** / Consult your health care provider. Pregnant women should receive 1 dose of Tdap vaccine during each pregnancy. 1 dose of Td every 10 years.  Varicella vaccine.** / Consult your health care provider. Pregnant females who do not have evidence of immunity should receive the first dose after pregnancy.  HPV vaccine. / 3 doses over 6 months, if 93 and younger. The vaccine is not recommended for use in pregnant females. However, pregnancy testing is not needed before receiving a dose.  Measles, mumps, rubella (MMR) vaccine.** / You need at least 1 dose of MMR if you were born in 1957 or later. You may also need a 2nd dose. For females of childbearing age, rubella immunity should be determined. If there is no evidence of immunity, females who are not pregnant should be vaccinated. If there is no evidence of immunity, females who are  pregnant should delay immunization until after pregnancy.  Pneumococcal  13-valent conjugate (PCV13) vaccine.** / Consult your health care provider.  Pneumococcal polysaccharide (PPSV23) vaccine.** / 1 to 2 doses if you smoke cigarettes or if you have certain conditions.  Meningococcal vaccine.** / 1 dose if you are age 68 to 8 years and a Market researcher living in a residence hall, or have one of several medical conditions, you need to get vaccinated against meningococcal disease. You may also need additional booster doses.  Hepatitis A vaccine.** / Consult your health care provider.  Hepatitis B vaccine.** / Consult your health care provider.  Haemophilus influenzae type b (Hib) vaccine.** / Consult your health care provider. Ages 7 to 53 years  Blood pressure check.** / Every year.  Lipid and cholesterol check.** / Every 5 years beginning at age 25 years.  Lung cancer screening. / Every year if you are aged 11-80 years and have a 30-pack-year history of smoking and currently smoke or have quit within the past 15 years. Yearly screening is stopped once you have quit smoking for at least 15 years or develop a health problem that would prevent you from having lung cancer treatment.  Clinical breast exam.** / Every year after age 48 years.  BRCA-related cancer risk assessment.** / For women who have family members with a BRCA-related cancer (breast, ovarian, tubal, or peritoneal cancers).  Mammogram.** / Every year beginning at age 41 years and continuing for as long as you are in good health. Consult with your health care provider.  Pap test.** / Every 3 years starting at age 65 years through age 37 or 70 years with a history of 3 consecutive normal Pap tests.  HPV screening.** / Every 3 years from ages 72 years through ages 60 to 40 years with a history of 3 consecutive normal Pap tests.  Fecal occult blood test (FOBT) of stool. / Every year beginning at age 21 years and continuing until age 5 years. You may not need to do this test if you get  a colonoscopy every 10 years.  Flexible sigmoidoscopy or colonoscopy.** / Every 5 years for a flexible sigmoidoscopy or every 10 years for a colonoscopy beginning at age 35 years and continuing until age 48 years.  Hepatitis C blood test.** / For all people born from [birth year] through 1965 and any individual with known risks for hepatitis C.  Skin self-exam. / Monthly.  Influenza vaccine. / Every year.  Tetanus, diphtheria, and acellular pertussis (Tdap/Td) vaccine.** / Consult your health care provider. Pregnant women should receive 1 dose of Tdap vaccine during each pregnancy. 1 dose of Td every 10 years.  Varicella vaccine.** / Consult your health care provider. Pregnant females who do not have evidence of immunity should receive the first dose after pregnancy.  Zoster vaccine.** / 1 dose for adults aged 30 years or older.  Measles, mumps, rubella (MMR) vaccine.** / You need at least 1 dose of MMR if you were born in 1957 or later. You may also need a second dose. For females of childbearing age, rubella immunity should be determined. If there is no evidence of immunity, females who are not pregnant should be vaccinated. If there is no evidence of immunity, females who are pregnant should delay immunization until after pregnancy.  Pneumococcal 13-valent conjugate (PCV13) vaccine.** / Consult your health care provider.  Pneumococcal polysaccharide (PPSV23) vaccine.** / 1 to 2 doses if you smoke cigarettes or if you have certain conditions.  Meningococcal vaccine.** /  Consult your health care provider.  Hepatitis A vaccine.** / Consult your health care provider.  Hepatitis B vaccine.** / Consult your health care provider.  Haemophilus influenzae type b (Hib) vaccine.** / Consult your health care provider. Ages 64 years and over  Blood pressure check.** / Every year.  Lipid and cholesterol check.** / Every 5 years beginning at age 23 years.  Lung cancer screening. / Every year if you  are aged 16-80 years and have a 30-pack-year history of smoking and currently smoke or have quit within the past 15 years. Yearly screening is stopped once you have quit smoking for at least 15 years or develop a health problem that would prevent you from having lung cancer treatment.  Clinical breast exam.** / Every year after age 74 years.  BRCA-related cancer risk assessment.** / For women who have family members with a BRCA-related cancer (breast, ovarian, tubal, or peritoneal cancers).  Mammogram.** / Every year beginning at age 44 years and continuing for as long as you are in good health. Consult with your health care provider.  Pap test.** / Every 3 years starting at age 58 years through age 22 or 39 years with 3 consecutive normal Pap tests. Testing can be stopped between 65 and 70 years with 3 consecutive normal Pap tests and no abnormal Pap or HPV tests in the past 10 years.  HPV screening.** / Every 3 years from ages 64 years through ages 70 or 61 years with a history of 3 consecutive normal Pap tests. Testing can be stopped between 65 and 70 years with 3 consecutive normal Pap tests and no abnormal Pap or HPV tests in the past 10 years.  Fecal occult blood test (FOBT) of stool. / Every year beginning at age 40 years and continuing until age 27 years. You may not need to do this test if you get a colonoscopy every 10 years.  Flexible sigmoidoscopy or colonoscopy.** / Every 5 years for a flexible sigmoidoscopy or every 10 years for a colonoscopy beginning at age 7 years and continuing until age 32 years.  Hepatitis C blood test.** / For all people born from [birth year] through 1966 and any individual with known risks for hepatitis C.  Osteoporosis screening.** / A one-time screening for women ages 30 years and over and women at risk for fractures or osteoporosis.  Skin self-exam. / Monthly.  Influenza vaccine. / Every year.  Tetanus, diphtheria, and acellular pertussis (Tdap/Td)  vaccine.** / 1 dose of Td every 10 years.  Varicella vaccine.** / Consult your health care provider.  Zoster vaccine.** / 1 dose for adults aged 35 years or older.  Pneumococcal 13-valent conjugate (PCV13) vaccine.** / Consult your health care provider.  Pneumococcal polysaccharide (PPSV23) vaccine.** / 1 dose for all adults aged 46 years and older.  Meningococcal vaccine.** / Consult your health care provider.  Hepatitis A vaccine.** / Consult your health care provider.  Hepatitis B vaccine.** / Consult your health care provider.  Haemophilus influenzae type b (Hib) vaccine.** / Consult your health care provider. ** Family history and personal history of risk and conditions may change your health care provider's recommendations.   This information is not intended to replace advice given to you by your health care provider. Make sure you discuss any questions you have with your health care provider.   Document Released: 10/02/2001 Document Revised: 08/27/2014 Document Reviewed: 01/01/2011 Elsevier Interactive Patient Education [employer].

## 2016-03-23 NOTE — Telephone Encounter (Signed)
Rx re-faxed. It was previous ordered as an inpatient Rx.    KP

## 2016-03-24 LAB — HEPATITIS C ANTIBODY: HCV AB: NEGATIVE

## 2016-04-02 ENCOUNTER — Telehealth: Payer: Self-pay | Admitting: Internal Medicine

## 2016-04-02 NOTE — Telephone Encounter (Signed)
PT called, she stated that she has gained a significant amount of weight just in the last 10 days.  She is concerned because she believes it is because of the medication (Methimazole) she requests call back to discuss.

## 2016-04-02 NOTE — Telephone Encounter (Signed)
This is normal weight gain with normalization of her TFTs. Her recent TSH from earlier this month was finally normal. Please continue current regimen and will recheck the TFTs at next visit in September.

## 2016-04-03 ENCOUNTER — Telehealth: Payer: Self-pay

## 2016-04-03 NOTE — Telephone Encounter (Signed)
Called and notified patient of Dr.Gherghe response to her gaining weight. Advised her of that it was normal, and to continue the same dose she is taking now. We would recheck labs at her next appointment. Patient states she understood, no questions or concerns.

## 2016-04-10 ENCOUNTER — Ambulatory Visit (HOSPITAL_BASED_OUTPATIENT_CLINIC_OR_DEPARTMENT_OTHER)
Admission: RE | Admit: 2016-04-10 | Discharge: 2016-04-10 | Disposition: A | Discharge status: Home / Self Care | Payer: BC Managed Care – PPO | Source: Ambulatory Visit | Attending: Family Medicine | Admitting: Family Medicine

## 2016-04-10 DIAGNOSIS — Z1231 Encounter for screening mammogram for malignant neoplasm of breast: Secondary | ICD-10-CM | POA: Insufficient documentation

## 2016-04-17 ENCOUNTER — Telehealth: Payer: Self-pay | Admitting: Gastroenterology

## 2016-04-17 NOTE — Telephone Encounter (Signed)
Patient reports gas, bloating, and weight gain.  I recommended she try Align or IBgard until her office visit 05/14/16

## 2016-04-20 ENCOUNTER — Telehealth: Payer: Self-pay | Admitting: Family Medicine

## 2016-04-20 ENCOUNTER — Ambulatory Visit (INDEPENDENT_AMBULATORY_CARE_PROVIDER_SITE_OTHER): Payer: BC Managed Care – PPO | Admitting: Physician Assistant

## 2016-04-20 VITALS — BP 120/70 | HR 55 | Temp 97.5°F | Wt 231.2 lb

## 2016-04-20 DIAGNOSIS — R1013 Epigastric pain: Secondary | ICD-10-CM

## 2016-04-20 LAB — COMPREHENSIVE METABOLIC PANEL
ALBUMIN: 4.1 g/dL (ref 3.5–5.2)
ALK PHOS: 82 U/L (ref 39–117)
ALT: 23 U/L (ref 0–35)
AST: 20 U/L (ref 0–37)
BILIRUBIN TOTAL: 0.7 mg/dL (ref 0.2–1.2)
BUN: 9 mg/dL (ref 6–23)
CO2: 31 mEq/L (ref 19–32)
Calcium: 9.1 mg/dL (ref 8.4–10.5)
Chloride: 105 mEq/L (ref 96–112)
Creatinine, Ser: 0.99 mg/dL (ref 0.40–1.20)
GFR: 74.55 mL/min (ref >60.00)
GLUCOSE: 89 mg/dL (ref 70–99)
POTASSIUM: 4.5 meq/L (ref 3.5–5.1)
SODIUM: 140 meq/L (ref 135–145)
TOTAL PROTEIN: 7.2 g/dL (ref 6.0–8.3)

## 2016-04-20 LAB — CBC WITH DIFFERENTIAL/PLATELET
BASOS ABS: 0 10*3/uL (ref 0.0–0.1)
Basophils Relative: 0.4 % (ref 0.0–3.0)
EOS PCT: 1.9 % (ref 0.0–5.0)
Eosinophils Absolute: 0.1 10*3/uL (ref 0.0–0.7)
HCT: 39.3 % (ref 36.0–46.0)
HEMOGLOBIN: 13.2 g/dL (ref 12.0–15.0)
Lymphocytes Relative: 36.4 % (ref 12.0–46.0)
Lymphs Abs: 2.6 10*3/uL (ref 0.7–4.0)
MCHC: 33.5 g/dL (ref 30.0–36.0)
MCV: 85.4 fl (ref 78.0–100.0)
MONOS PCT: 9.3 % (ref 3.0–12.0)
Monocytes Absolute: 0.7 10*3/uL (ref 0.1–1.0)
Neutro Abs: 3.6 10*3/uL (ref 1.4–7.7)
Neutrophils Relative %: 52 % (ref 43.0–77.0)
Platelets: 268 10*3/uL (ref 150.0–400.0)
RBC: 4.61 Mil/uL (ref 3.87–5.11)
RDW: 16.6 % — ABNORMAL HIGH (ref 11.5–15.5)
WBC: 7 10*3/uL (ref 4.0–10.5)

## 2016-04-20 LAB — SEDIMENTATION RATE: Sed Rate: 9 mm/hr (ref 0–30)

## 2016-04-20 LAB — LIPASE: LIPASE: 9 U/L — AB (ref 11.0–59.0)

## 2016-04-20 MED ORDER — HYOSCYAMINE SULFATE 0.125 MG PO TABS: 0.1250 mg | ORAL_TABLET | ORAL | 0 refills | Status: DC | PRN

## 2016-04-20 NOTE — Telephone Encounter (Signed)
Patient Name: Emily Osborne  DOB: [birth year]    Initial Comment Caller states they're having severe abdominal pain.   Nurse Assessment  Nurse: Mallie Mussel, RN, Alveta Heimlich Date/Time Eilene Ghazi Time): 04/20/2016 8:35:56 AM  Confirm and document reason for call. If symptomatic, describe symptoms. You must click the next button to save text entered. ---Caller states that she has lower abdominal pain which began last week. The pain is not constant. She rates her pain as 10 on 0-10 scale. The pain will usually lasts 30-60. She has had diarrhea and bloating. She has had diarrhea which began last week. She has diarrhea in the mornings and formed stools by the end of the day. She has some bloating now, but not as big as last night. She has had diarrhea x 8 in the last 24 hours. She last urinated a few minutes ago. Denies vomiting in the last 24 hours. Denies fever.  Has the patient traveled out of the country within the last 30 days? ---No  Does the patient have any new or worsening symptoms? ---Yes  Will a triage be completed? ---Yes  Related visit to physician within the last 2 weeks? ---No  Does the PT have any chronic conditions? (i.e. diabetes, asthma, etc.) ---Yes  List chronic conditions. ---Asthma, GERD, Hyperthyroidism  Is this a behavioral health or substance abuse call? ---No     Guidelines    Guideline Title Affirmed Question Affirmed Notes  Abdominal Pain - Female [1] MODERATE (e.g., interferes with normal activities) AND [2] pain comes and goes (cramps) AND [3] present > 24 hours (Exception: pain with Vomiting or Diarrhea - see that Guideline)    Final Disposition User   See Physician within [place], RN, Alveta Heimlich    Comments  Appointment scheduled with Raiford Noble PA-C for 10:30am this morning.   Referrals  REFERRED TO PCP OFFICE   Disagree/Comply: Comply

## 2016-04-20 NOTE — Telephone Encounter (Signed)
Apt scheduled with Einar Pheasant.     KP

## 2016-04-20 NOTE — Progress Notes (Signed)
Patient presents to clinic today c/o 1 week of abdominal pain, epigastric abdominal pain, described as pulling/cramping. Associated symptoms include nausea without vomiting. Also loose stools occurring a couple of times a day after meals without blood. Denies tenesmus. Denies urinary urgency, frequency or dysuria. Denies fever or chills but notes malaise. Has history of GERD, currently well-controlled with Dexilant. Was told by GI to restart a probiotic which she has done. States she felt she was to not be on Dexilant with probiotic so has stopped PPI 2 days ago.    Past Medical History:  Diagnosis Date  . Asthma   . Edema   . Elevated blood pressure reading without diagnosis of hypertension    "just elevated when I'm in pain" (12/07/2015)  . Fibromyalgia   . Ganglion of joint    right wrist  . GERD (gastroesophageal reflux disease)   . Herpes zoster without mention of complication   . Hyperlipidemia   . IBS (irritable bowel syndrome)   . Nontraumatic rupture of Achilles tendon   . Other acute reactions to stress   . Pain in joint, shoulder region   . Pain in joint, upper arm   . Pain in limb   . Personal history of other diseases of digestive system     Current Outpatient Prescriptions on File Prior to Visit  Medication Sig Dispense Refill  . acetaminophen (TYLENOL) 500 MG tablet Take 1,000 mg by mouth every 6 (six) hours as needed for moderate pain.     Marland Kitchen albuterol (PROAIR HFA) 108 (90 Base) MCG/ACT inhaler USE 2 PUFFS EVERY 4 HOURS AS NEEDED FOR COUGH, WHEEZE OR SHORTNESS OF BREATH. 8.5 each 2  . ALPRAZolam (XANAX) 0.25 MG tablet Take 1 tablet (0.25 mg total) by mouth 3 (three) times daily as needed. 30 tablet 1  . atenolol (TENORMIN) 25 MG tablet Take 1 tablet (25 mg total) by mouth 2 (two) times daily. 90 tablet 2  . Blood Glucose Monitoring Suppl (FREESTYLE FREEDOM) KIT 1 Device by Does not apply route daily. 1 each 0  . Calcium Citrate-Vitamin D3 1000-400 LIQD 1 po qd  (Patient taking differently: Take 1 tablet by mouth daily. ) 480 mL 5  . cholecalciferol (VITAMIN D) 1000 units tablet Take 2,000 Units by mouth daily.    Marland Kitchen Dexlansoprazole 30 MG capsule Take 1 capsule (30 mg total) by mouth daily. 90 capsule 3  . diclofenac sodium (VOLTAREN) 1 % GEL Apply 2 g topically daily as needed (pain). For pain    . fexofenadine (ALLEGRA) 180 MG tablet Take 1 tablet (180 mg total) by mouth daily. 90 tablet 3  . fluticasone (FLONASE) 50 MCG/ACT nasal spray PLACE 2 SPRAYS INTO BOTH NOSTRILS DAILY. 16 g 4  . Fluticasone-Salmeterol (ADVAIR DISKUS) 250-50 MCG/DOSE AEPB Inhale 1 puff into the lungs 2 (two) times daily. 60 each 2  . FREESTYLE LITE test strip USE TO CHECK BLOOD SUGAR ONCE A DAY 100 each 6  . Iron Combinations (I.L.X. B-12) ELIX Take 2 drops by mouth daily.    Marland Kitchen lidocaine (LIDODERM) 5 % APPLY 1 PATCH ONTO SKIN EVERY 12 HOURS REMOVE AND DISCARD PATCH WITHIN 12 HOURS OR AS DIRECTED BY MD 30 patch 0  . meclizine (ANTIVERT) 32 MG tablet Take 1 tablet (32 mg total) by mouth 3 (three) times daily as needed for dizziness. 30 tablet 0  . methimazole (TAPAZOLE) 5 MG tablet Take 1 tablet (5 mg total) by mouth daily. 30 tablet 2  . valACYclovir (VALTREX)  1000 MG tablet Take 1 tablet (1,000 mg total) by mouth 3 (three) times daily. 30 tablet 5   No current facility-administered medications on file prior to visit.     Allergies  Allergen Reactions  . Gentamicin     Eye drops turned the sclera bright red  . Aspirin     REACTION: anaphylaxis  . Doxycycline     REACTION: severe nausea/vomiting  . Ibuprofen     REACTION: anaphylaxsis  . Influenza A (H1n1) Monoval Vac     REACTION: sick for 3 weeks  . Loratadine Other (See Comments)    Fatigue/weakness  . Metronidazole     REACTION: red face/swelling  . Nsaids     REACTION: anaphylaxis  . Ranitidine Hcl     REACTION: Lips re/peel  . Sulfamethoxazole-Trimethoprim     REACTION: face red/peel  . Tramadol Hcl      REACTION: paranoid    Family History  Problem Relation Age of Onset  . Hypertension Father   . Diabetes Father   . Heart disease Father   . Kidney disease Father     Died, 60  . Leukemia Brother   . Cancer Brother     myleoblastic anemia  . Hyperlipidemia Mother   . Hypertension Mother     Died, 17  . Thyroid disease Mother     Thyroid surgery  . Pernicious anemia Sister   . Thyroid disease Sister     On thyroid Rx  . Lupus Daughter   . Coronary artery disease Brother   . Hypertension Brother     Social History   Social History  . Marital status: Divorced    Spouse name: N/A  . Number of children: 2  . Years of education: N/A   Occupational History  . Nurse/teacher    Social History Main Topics  . Smoking status: Never Smoker  . Smokeless tobacco: Never Used  . Alcohol use No  . Drug use: No  . Sexual activity: Not Currently    Partners: Male   Other Topics Concern  . Not on file   Social History Narrative   Lives with alone.  She has two daughter.   She works as a Programme researcher, broadcasting/film/video at [employer]--- no   Review of Systems - See HPI.  All other ROS are negative.  BP 120/70 (BP Location: Right Arm, Patient Position: Sitting, Cuff Size: Large)   Pulse (!) 55   Temp 97.5 F (36.4 C) (Oral)   Wt 231 lb 3.2 oz (104.9 kg)   SpO2 98%   BMI 38.47 kg/m   Physical Exam  Constitutional: She is oriented to person, place, and time and well-developed, well-nourished, and in no distress.  HENT:  Head: Normocephalic and atraumatic.  Eyes: Conjunctivae are normal.  Neck: Neck supple.  Cardiovascular: Normal rate, regular rhythm, normal heart sounds and intact distal pulses.   Pulmonary/Chest: Effort normal and breath sounds normal. No respiratory distress. She has no wheezes. She has no rales. She exhibits no tenderness.  Abdominal: Soft. Bowel sounds are normal. There is tenderness in the epigastric area. There is negative Murphy's sign.  Neurological:  She is alert and oriented to person, place, and time.  Skin: Skin is warm and dry.  Psychiatric: Affect normal.   Recent Results (from the past 2160 hour(s))  Magnesium     Status: None   Collection Time: 02/06/16 10:09 AM  Result Value Ref Range   Magnesium 2.0 1.5 - 2.5 mg/dL  Hepatitis C antibody     Status: None   Collection Time: 03/23/16  3:01 PM  Result Value Ref Range   HCV Ab NEGATIVE NEGATIVE  CBC w/Diff     Status: Abnormal   Collection Time: 03/23/16  3:01 PM  Result Value Ref Range   WBC 5.8 3.8 - 10.8 K/uL   RBC 5.05 3.80 - 5.10 MIL/uL   Hemoglobin 14.0 11.7 - 15.5 g/dL   HCT 43.4 35.0 - 45.0 %   MCV 85.9 80.0 - 100.0 fL   MCH 27.7 27.0 - 33.0 pg   MCHC 32.3 32.0 - 36.0 g/dL   RDW 15.9 (H) 11.0 - 15.0 %   Platelets 257 140 - 400 K/uL   MPV 8.9 7.5 - 12.5 fL   Neutro Abs 2,204 1,500 - 7,800 cells/uL   Lymphs Abs 2,958 850 - 3,900 cells/uL   Monocytes Absolute 522 200 - 950 cells/uL   Eosinophils Absolute 116 15 - 500 cells/uL   Basophils Absolute 0 0 - 200 cells/uL   Neutrophils Relative % 38 %   Lymphocytes Relative 51 %   Monocytes Relative 9 %   Eosinophils Relative 2 %   Basophils Relative 0 %   Smear Review Criteria for review not met     Comment: ** Please note change in unit of measure and reference range(s). **  Lipid panel     Status: Abnormal   Collection Time: 03/23/16  3:01 PM  Result Value Ref Range   Cholesterol 201 (H) 125 - 200 mg/dL   Triglycerides 180 (H) <150 mg/dL   HDL 54 >=46 mg/dL   Total CHOL/HDL Ratio 3.7 <=5.0 Ratio   VLDL 36 (H) <30 mg/dL   LDL Cholesterol 111 <130 mg/dL    Comment:   Total Cholesterol/HDL Ratio:CHD Risk                        Coronary Heart Disease Risk Table                                        Men       Women          1/2 Average Risk              3.4        3.3              Average Risk              5.0        4.4           2X Average Risk              9.6        7.1           3X Average Risk              23.4       11.0 Use the calculated Patient Ratio above and the CHD Risk table  to determine the patient's CHD Risk.   TSH     Status: None   Collection Time: 03/23/16  3:01 PM  Result Value Ref Range   TSH 0.90 mIU/L    Comment:   Reference Range   > or = 20 Years  0.40-4.50   Pregnancy Range First trimester  0.26-2.66 Second trimester 0.55-2.73 Third trimester  0.43-2.91     Vitamin B12     Status: None   Collection Time: 03/23/16  3:01 PM  Result Value Ref Range   Vitamin B-12 609 200 - 1,100 pg/mL  Comp Met (CMET)     Status: None   Collection Time: 03/23/16  3:01 PM  Result Value Ref Range   Sodium 141 135 - 146 mmol/L   Potassium 4.3 3.5 - 5.3 mmol/L   Chloride 101 98 - 110 mmol/L   CO2 28 20 - 31 mmol/L   Glucose, Bld 78 65 - 99 mg/dL   BUN 10 7 - 25 mg/dL   Creat 0.97 0.50 - 1.05 mg/dL    Comment:   For patients > or = 56 years of age: The upper reference limit for Creatinine is approximately 13% higher for people identified as African-American.      Total Bilirubin 0.5 0.2 - 1.2 mg/dL   Alkaline Phosphatase 76 33 - 130 U/L   AST 23 10 - 35 U/L   ALT 27 6 - 29 U/L   Total Protein 7.2 6.1 - 8.1 g/dL   Albumin 4.4 3.6 - 5.1 g/dL   Calcium 9.5 8.6 - 10.4 mg/dL  POCT urinalysis dipstick     Status: None   Collection Time: 03/23/16  3:28 PM  Result Value Ref Range   Color, UA yellow    Clarity, UA clear    Glucose, UA neg    Bilirubin, UA neg    Ketones, UA neg    Spec Grav, UA >=1.030    Blood, UA neg    pH, UA 6.0    Protein, UA neg    Urobilinogen, UA 0.2    Nitrite, UA neg    Leukocytes, UA Negative Negative   Assessment/Plan: 1. Abdominal pain, epigastric Gastritis/Gastroenteritis versus pancreatitis. Will check STAT labs today (see below). Diet and supportive measures reviewed.  Will order imaging based on lab results. Rx Anazpaz for intestinal cramping. Has GI FU scheduled. History of H. Pylori treated previously. Unable to check today as  she would need test for active infection which requires her stopping PPI. Suspect exacerbation of symptoms yesterday  due to her holding PPI over past couple of days. Alarm signs/symptoms discussed with patient and daughter that would prompt ER assessment.  - CBC w/Diff - Comp Met (CMET) - Lipase - Sed Rate (ESR) - hyoscyamine (LEVSIN, ANASPAZ) 0.125 MG tablet; Take 1 tablet (0.125 mg total) by mouth every 4 (four) hours as needed.  Dispense: 45 tablet; Refill: 0   Leeanne Rio, [place]

## 2016-04-20 NOTE — Progress Notes (Signed)
Pre visit review using our clinic review tool, if applicable. No additional management support is needed unless otherwise documented below in the visit note. 

## 2016-04-20 NOTE — Telephone Encounter (Signed)
Patient called stating that she is having severe abdominal pain. States she may have a hernia. Transferred to [facility].

## 2016-04-20 NOTE — Patient Instructions (Signed)
Please go to the lab for blood work. I will call with results. Continue probiotic. Restart the [place] taking as directed.  Take the hyoscyamine as directed to help with cramping. Stay hydrated. Eat a bland diet.   We will alter regimen based on results. If you have any worsening of symptoms while we are waiting on assessment, please go to the ER.   Food Choices to Help Relieve Diarrhea, Adult When you have diarrhea, the foods you eat and your eating habits are very important. Choosing the right foods and drinks can help relieve diarrhea. Also, because diarrhea can last up to 7 days, you need to replace lost fluids and electrolytes (such as sodium, potassium, and chloride) in order to help prevent dehydration.  WHAT GENERAL GUIDELINES DO I NEED TO FOLLOW?  Slowly drink 1 cup (8 oz) of fluid for each episode of diarrhea. If you are getting enough fluid, your urine will be clear or pale yellow.  Eat starchy foods. Some good choices include white rice, white toast, pasta, low-fiber cereal, baked potatoes (without the skin), saltine crackers, and bagels.  Avoid large servings of any cooked vegetables.  Limit fruit to two servings per day. A serving is  cup or 1 small piece.  Choose foods with less than 2 g of fiber per serving.  Limit fats to less than 8 tsp (38 g) per day.  Avoid fried foods.  Eat foods that have probiotics in them. Probiotics can be found in certain dairy products.  Avoid foods and beverages that may increase the speed at which food moves through the stomach and intestines (gastrointestinal tract). Things to avoid include:  High-fiber foods, such as dried fruit, raw fruits and vegetables, nuts, seeds, and whole grain foods.  Spicy foods and high-fat foods.  Foods and beverages sweetened with high-fructose corn syrup, honey, or sugar alcohols such as xylitol, sorbitol, and mannitol. WHAT FOODS ARE RECOMMENDED? Grains White rice. White, [place], or pita breads  (fresh or toasted), including plain rolls, buns, or bagels. White pasta. Saltine, soda, or graham crackers. Pretzels. Low-fiber cereal. Cooked cereals made with water (such as cornmeal, farina, or cream cereals). Plain muffins. Matzo. Melba toast. Zwieback.  Vegetables Potatoes (without the skin). Strained tomato and vegetable juices. Most well-cooked and canned vegetables without seeds. Tender lettuce. Fruits Cooked or canned applesauce, apricots, cherries, fruit cocktail, grapefruit, peaches, pears, or plums. Fresh bananas, apples without skin, cherries, grapes, cantaloupe, grapefruit, peaches, oranges, or plums.  Meat and Other Protein Products Baked or boiled chicken. Eggs. Tofu. Fish. Seafood. Smooth peanut butter. Ground or well-cooked tender beef, ham, veal, lamb, pork, or poultry.  Dairy Plain yogurt, kefir, and unsweetened liquid yogurt. Lactose-free milk, buttermilk, or soy milk. Plain hard cheese. Beverages Sport drinks. Clear broths. Diluted fruit juices (except prune). Regular, caffeine-free sodas such as ginger ale. Water. Decaffeinated teas. Oral rehydration solutions. Sugar-free beverages not sweetened with sugar alcohols. Other Bouillon, broth, or soups made from recommended foods.  The items listed above may not be a complete list of recommended foods or beverages. Contact your dietitian for more options. WHAT FOODS ARE NOT RECOMMENDED? Grains Whole grain, whole wheat, bran, or rye breads, rolls, pastas, crackers, and cereals. Wild or brown rice. Cereals that contain more than 2 g of fiber per serving. Corn tortillas or taco shells. Cooked or dry oatmeal. Granola. Popcorn. Vegetables Raw vegetables. Cabbage, broccoli, Brussels sprouts, artichokes, baked beans, beet greens, corn, kale, legumes, peas, sweet potatoes, and yams. Potato skins. Cooked spinach and cabbage. Fruits  Dried fruit, including raisins and dates. Raw fruits. Stewed or dried prunes. Fresh apples with skin,  apricots, mangoes, pears, raspberries, and strawberries.  Meat and Other Protein Products Chunky peanut butter. Nuts and seeds. Beans and lentils. Berniece Salines.  Dairy High-fat cheeses. Milk, chocolate milk, and beverages made with milk, such as milk shakes. Cream. Ice cream. Sweets and Desserts Sweet rolls, doughnuts, and sweet breads. Pancakes and waffles. Fats and Oils Butter. Cream sauces. Margarine. Salad oils. Plain salad dressings. Olives. Avocados.  Beverages Caffeinated beverages (such as coffee, tea, soda, or energy drinks). Alcoholic beverages. Fruit juices with pulp. Prune juice. Soft drinks sweetened with high-fructose corn syrup or sugar alcohols. Other Coconut. Hot sauce. Chili powder. Mayonnaise. Gravy. Cream-based or milk-based soups.  The items listed above may not be a complete list of foods and beverages to avoid. Contact your dietitian for more information. WHAT SHOULD I DO IF I BECOME DEHYDRATED? Diarrhea can sometimes lead to dehydration. Signs of dehydration include dark urine and dry mouth and skin. If you think you are dehydrated, you should rehydrate with an oral rehydration solution. These solutions can be purchased at pharmacies, retail stores, or online.  Drink -1 cup (120-240 mL) of oral rehydration solution each time you have an episode of diarrhea. If drinking this amount makes your diarrhea worse, try drinking smaller amounts more often. For example, drink 1-3 tsp (5-15 mL) every 5-10 minutes.  A general rule for staying hydrated is to drink 1-2 L of fluid per day. Talk to your health care provider about the specific amount you should be drinking each day. Drink enough fluids to keep your urine clear or pale yellow.   This information is not intended to replace advice given to you by your health care provider. Make sure you discuss any questions you have with your health care provider.   Document Released: 10/27/2003 Document Revised: 08/27/2014 Document Reviewed:  06/29/2013 Elsevier Interactive Patient Education [employer].

## 2016-04-24 ENCOUNTER — Other Ambulatory Visit: Payer: Self-pay

## 2016-04-24 MED ORDER — ALPRAZOLAM 0.25 MG PO TABS: 0.2500 mg | ORAL_TABLET | Freq: Three times a day (TID) | ORAL | 1 refills | Status: DC | PRN

## 2016-04-24 NOTE — Telephone Encounter (Signed)
Last seen 03/23/16 and filled 03/19/16 #30 with 1 refill Prescribed TID    Please advise    KP

## 2016-04-24 NOTE — Telephone Encounter (Signed)
Rx faxed.    KP 

## 2016-04-27 ENCOUNTER — Ambulatory Visit (INDEPENDENT_AMBULATORY_CARE_PROVIDER_SITE_OTHER): Payer: BC Managed Care – PPO | Admitting: Internal Medicine

## 2016-04-27 ENCOUNTER — Encounter: Payer: Self-pay | Admitting: Internal Medicine

## 2016-04-27 VITALS — BP 118/70 | HR 61 | Wt 232.0 lb

## 2016-04-27 DIAGNOSIS — E0500 Thyrotoxicosis with diffuse goiter without thyrotoxic crisis or storm: Secondary | ICD-10-CM | POA: Diagnosis not present

## 2016-04-27 LAB — T3, FREE: T3, Free: 2.8 pg/mL (ref 2.3–4.2)

## 2016-04-27 LAB — T4, FREE: Free T4: 0.76 ng/dL (ref 0.60–1.60)

## 2016-04-27 LAB — TSH: TSH: 1.3 u[IU]/mL (ref 0.35–4.50)

## 2016-04-27 MED ORDER — METHIMAZOLE 5 MG PO TABS: 2.5000 mg | ORAL_TABLET | Freq: Every day | ORAL | 2 refills | Status: DC

## 2016-04-27 NOTE — Progress Notes (Signed)
Patient ID: Emily Osborne, female   DOB: 11-28-1960, 55 y.o.   MRN: [id number]    HPI  Emily Osborne is a 56 y.o.-year-old female, returning for f/u for Graves ds. Patient was previously seen by Dr. Dwyane Dee , last visit 10/2013. Last visit with me 12/2015.  She has gastritis recently.  I reviewed pt's thyroid tests: Lab Results  Component Value Date   TSH 0.90 03/23/2016   TSH 0.01 (L) 12/27/2015   TSH 0.01 (L) 12/14/2015   TSH 0.021 (L) 12/07/2015   TSH 0.11 (L) 10/31/2015   TSH 0.87 09/07/2014   TSH 0.29 (L) 10/28/2013   TSH 0.32 (L) 09/02/2013   TSH 0.59 06/20/2012   TSH 0.57 06/21/2011   FREET4 1.61 (H) 12/27/2015   FREET4 1.34 (H) 12/08/2015   FREET4 1.18 11/02/2015   FREET4 0.90 10/28/2013   FREET4 0.8 12/27/2009    Component     Latest Ref Rng 12/27/2015  TSI     <140 % baseline 393 (H)   We started MMI 5 mg daily at last visit >> subsequent TFTs have normalized.  We also started Atenolol 25 mg 2x a day.  Pt denies feeling nodules in neck, hoarseness, dysphagia/odynophagia, SOB with lying down; she c/o: - + fatigue - + weight gain - + excessive sweating/heat intolerance and cold intolerance - no tremors - no anxiety - no palpitations - no hyperdefecation - no hair loss  Pt does not have a FH of thyroid ds. (mother, sister). No FH of thyroid cancer. No h/o radiation tx to head or neck.  No seaweed or kelp, + recent contrast studies (CT scan 12/07/2015). No steroid use. No herbal supplements. No Biotin use.   she does have a history of B12 deficiency.  ROS: Constitutional: + HPI Eyes: no blurry vision, no xerophthalmia ENT: no sore throat, no nodules palpated in throat, no dysphagia/odynophagia, no hoarseness Cardiovascular: no CP/no palpitations/no leg swelling Respiratory: no cough/+ SOB Gastrointestinal:  + N/ no V/D/C, no  Acid reflux Musculoskeletal: no muscle/joint aches Skin: no rashes Neurological: notremors/no numbness/tingling/dizziness  I  reviewed pt's medications, allergies, PMH, social hx, family hx, and changes were documented in the history of present illness. Otherwise, unchanged from my initial visit note.  Past Medical History:  Diagnosis Date  . Asthma   . Edema   . Elevated blood pressure reading without diagnosis of hypertension    "just elevated when I'm in pain" (12/07/2015)  . Fibromyalgia   . Ganglion of joint    right wrist  . GERD (gastroesophageal reflux disease)   . Herpes zoster without mention of complication   . Hyperlipidemia   . IBS (irritable bowel syndrome)   . Nontraumatic rupture of Achilles tendon   . Other acute reactions to stress   . Pain in joint, shoulder region   . Pain in joint, upper arm   . Pain in limb   . Personal history of other diseases of digestive system    Past Surgical History:  Procedure Laterality Date  . ABDOMINAL HYSTERECTOMY    . ACHILLES TENDON REPAIR Right 2005  . BUNIONECTOMY Bilateral    Bunionectomy 1983  . GANGLION CYST EXCISION Right    wrist  . MENISCUS REPAIR Right 04/2014  . TUBAL LIGATION     Social History   Social History  . Marital status: Divorced    Spouse name: N/A  . Number of children: 2  . Years of education: N/A   Occupational History  .  Nurse/teacher    Social History Main Topics  . Smoking status: Never Smoker  . Smokeless tobacco: Never Used  . Alcohol use No  . Drug use: No  . Sexual activity: Not Currently    Partners: Male   Other Topics Concern  . Not on file   Social History Narrative   Lives with alone.  She has two daughter.   She works as a Programme researcher, broadcasting/film/video at [employer]--- no   Current Outpatient Prescriptions on File Prior to Visit  Medication Sig Dispense Refill  . acetaminophen (TYLENOL) 500 MG tablet Take 1,000 mg by mouth every 6 (six) hours as needed for moderate pain.     Marland Kitchen albuterol (PROAIR HFA) 108 (90 Base) MCG/ACT inhaler USE 2 PUFFS EVERY 4 HOURS AS NEEDED FOR COUGH, WHEEZE OR SHORTNESS OF  BREATH. 8.5 each 2  . ALPRAZolam (XANAX) 0.25 MG tablet Take 1 tablet (0.25 mg total) by mouth 3 (three) times daily as needed. 30 tablet 1  . atenolol (TENORMIN) 25 MG tablet Take 1 tablet (25 mg total) by mouth 2 (two) times daily. 90 tablet 2  . Blood Glucose Monitoring Suppl (FREESTYLE FREEDOM) KIT 1 Device by Does not apply route daily. 1 each 0  . Calcium Citrate-Vitamin D3 1000-400 LIQD 1 po qd (Patient taking differently: Take 1 tablet by mouth daily. ) 480 mL 5  . cholecalciferol (VITAMIN D) 1000 units tablet Take 2,000 Units by mouth daily.    Marland Kitchen Dexlansoprazole 30 MG capsule Take 1 capsule (30 mg total) by mouth daily. 90 capsule 3  . diclofenac sodium (VOLTAREN) 1 % GEL Apply 2 g topically daily as needed (pain). For pain    . fexofenadine (ALLEGRA) 180 MG tablet Take 1 tablet (180 mg total) by mouth daily. 90 tablet 3  . fluticasone (FLONASE) 50 MCG/ACT nasal spray PLACE 2 SPRAYS INTO BOTH NOSTRILS DAILY. 16 g 4  . Fluticasone-Salmeterol (ADVAIR DISKUS) 250-50 MCG/DOSE AEPB Inhale 1 puff into the lungs 2 (two) times daily. 60 each 2  . FREESTYLE LITE test strip USE TO CHECK BLOOD SUGAR ONCE A DAY 100 each 6  . hyoscyamine (LEVSIN, ANASPAZ) 0.125 MG tablet Take 1 tablet (0.125 mg total) by mouth every 4 (four) hours as needed. 45 tablet 0  . Iron Combinations (I.L.X. B-12) ELIX Take 2 drops by mouth daily.    Marland Kitchen lidocaine (LIDODERM) 5 % APPLY 1 PATCH ONTO SKIN EVERY 12 HOURS REMOVE AND DISCARD PATCH WITHIN 12 HOURS OR AS DIRECTED BY MD 30 patch 0  . meclizine (ANTIVERT) 32 MG tablet Take 1 tablet (32 mg total) by mouth 3 (three) times daily as needed for dizziness. 30 tablet 0  . methimazole (TAPAZOLE) 5 MG tablet Take 1 tablet (5 mg total) by mouth daily. 30 tablet 2  . Probiotic Product (ALIGN PO) Take by mouth.    . valACYclovir (VALTREX) 1000 MG tablet Take 1 tablet (1,000 mg total) by mouth 3 (three) times daily. 30 tablet 5   No current facility-administered medications on file  prior to visit.    Allergies  Allergen Reactions  . Gentamicin     Eye drops turned the sclera bright red  . Aspirin     REACTION: anaphylaxis  . Doxycycline     REACTION: severe nausea/vomiting  . Ibuprofen     REACTION: anaphylaxsis  . Influenza A (H1n1) Monoval Vac     REACTION: sick for 3 weeks  . Loratadine Other (See Comments)    Fatigue/weakness  .  Metronidazole     REACTION: red face/swelling  . Nsaids     REACTION: anaphylaxis  . Ranitidine Hcl     REACTION: Lips re/peel  . Sulfamethoxazole-Trimethoprim     REACTION: face red/peel  . Tramadol Hcl     REACTION: paranoid   Family History  Problem Relation Age of Onset  . Hypertension Father   . Diabetes Father   . Heart disease Father   . Kidney disease Father     Died, 26  . Leukemia Brother   . Cancer Brother     myleoblastic anemia  . Hyperlipidemia Mother   . Hypertension Mother     Died, 2  . Thyroid disease Mother     Thyroid surgery  . Pernicious anemia Sister   . Thyroid disease Sister     On thyroid Rx  . Lupus Daughter   . Coronary artery disease Brother   . Hypertension Brother    PE: BP 118/70 (BP Location: Left Arm, Patient Position: Sitting)   Pulse 61   Wt 232 lb (105.2 kg)   SpO2 97%   BMI 38.61 kg/m  Wt Readings from Last 3 Encounters:  04/27/16 232 lb (105.2 kg)  04/20/16 231 lb 3.2 oz (104.9 kg)  03/23/16 227 lb (103 kg)   Constitutional: overweight, in NAD Eyes: PERRLA, EOMI, no exophthalmos, no lid lag, no stare ENT: moist mucous membranes, no thyromegaly, no cervical lymphadenopathy Cardiovascular: RRR, No MRG Respiratory: CTA B Gastrointestinal: abdomen soft, NT, ND, BS+ Musculoskeletal: no deformities, strength intact in all 4 Skin: moist, warm, no rashes Neurological:  no tremor with outstretched hands, DTR normal in all 4  ASSESSMENT: 1. Graves ds.  PLAN:  1. Patient with a recently dx'ed Graves ds., initially with few possible thyrotoxic sxs: weight loss  (but she also had diverticulitis), some heat intolerance, tremors, palpitations. Now all improved on MMI low dose. However, she still feels fatigued >> will decrease the Atenolol and eventually stop. - will check the TSH, fT3 and fT4 today to see if we can decrease MMI  - we discussed about possible modalities of treatment for the above conditions, to include methimazole use, radioactive iodine ablation or (last resort) surgery. Will continue MMI for now. She agrees - RTC in 4 mo  Component     Latest Ref Rng & Units 04/27/2016  TSH     0.35 - 4.50 uIU/mL 1.30  Triiodothyronine,Free,Serum     2.3 - 4.2 pg/mL 2.8  T4,Free(Direct)     0.60 - 1.60 ng/dL 0.76   All tests are normal. We will decrease the methimazole dose to 2.5 mg daily and repeat TFTs in 2 months.  Philemon Kingdom, MD PhD [facility]

## 2016-04-27 NOTE — Patient Instructions (Signed)
Please continue Methimazole 5 mg daily.  Please decrease Atenolol to 25 mg in the evening x 1 week, then take 12.5 mg x1 week, then stop.  Please stop at the lab.  Please come back for a follow-up appointment in 4 months.

## 2016-05-03 ENCOUNTER — Encounter: Payer: Self-pay | Admitting: Family Medicine

## 2016-05-14 ENCOUNTER — Encounter: Payer: Self-pay | Admitting: Gastroenterology

## 2016-05-14 ENCOUNTER — Ambulatory Visit (INDEPENDENT_AMBULATORY_CARE_PROVIDER_SITE_OTHER): Payer: BC Managed Care – PPO | Admitting: Gastroenterology

## 2016-05-14 VITALS — BP 110/78 | HR 68 | Ht 65.0 in | Wt 232.0 lb

## 2016-05-14 DIAGNOSIS — R6881 Early satiety: Secondary | ICD-10-CM | POA: Diagnosis not present

## 2016-05-14 DIAGNOSIS — K219 Gastro-esophageal reflux disease without esophagitis: Secondary | ICD-10-CM | POA: Diagnosis not present

## 2016-05-14 DIAGNOSIS — R1013 Epigastric pain: Secondary | ICD-10-CM | POA: Diagnosis not present

## 2016-05-14 DIAGNOSIS — Z8619 Personal history of other infectious and parasitic diseases: Secondary | ICD-10-CM | POA: Diagnosis not present

## 2016-05-14 DIAGNOSIS — K5732 Diverticulitis of large intestine without perforation or abscess without bleeding: Secondary | ICD-10-CM

## 2016-05-14 MED ORDER — DEXLANSOPRAZOLE 60 MG PO CPDR: 60.0000 mg | DELAYED_RELEASE_CAPSULE | Freq: Every day | ORAL | 1 refills | Status: DC

## 2016-05-14 NOTE — Patient Instructions (Addendum)
If you are age 56 or older, your body mass index should be between 23-30. Your Body mass index is 38.61 kg/m. If this is out of the aforementioned range listed, please consider follow up with your Primary Care Provider.  If you are age 62 or younger, your body mass index should be between 19-25. Your Body mass index is 38.61 kg/m. If this is out of the aformentioned range listed, please consider follow up with your Primary Care Provider.   You have been scheduled for an endoscopy. Please follow written instructions given to you at your visit today. If you use inhalers (even only as needed), please bring them with you on the day of your procedure. Your physician has requested that you go to www.startemmi.com and enter the access code given to you at your visit today. This web site gives a general overview about your procedure. However, you should still follow specific instructions given to you by our office regarding your preparation for the procedure.  Take Hyocyamine as prescribed.  Dexilant 60mg  daily.  Please purchase the following medications over the counter and take as directed: Align. 1 capsule per day.  We have given you a low FODMOP diet to follow.

## 2016-05-14 NOTE — Progress Notes (Signed)
HPI :  55 y/o female here for a follow up visit  We had given her a course of empiric antibiotics for diverticulitis on May 17th for recurrence of her symptoms of diverticulitis.  Patient was unfortunately admitted in April for diverticulitis with contained possible microperforation. She was treated with bowel rest and antibiotics and did well, discharged. She was treated with Augmentin at discharge given her allergy to flagyl. She has had 2 suspected episodes of diverticulitis  She reports she has been having some bloating of her abdomen for a few days. . She took a probiotic which helped her symptoms. She states the cramping has since come back, was given some hyocyamine but did not take it, and her symptoms went away. She feels she has some postprandial discomfort in her epigastric area, when she eats anything with dairy. Certain foods will make her feel bloated / cramping. She's had symptoms like this with prior H pylori infection in 2009, for which she had 2 courses of therapy. No vomiting, periodic nausea. She endorses early satiety, she feels worse with the more she eats. She denies much RUQ, usually epigastric. Discomfort usually within 30-60 min, and lasts several hours at time. No routine radiation to the shoulder.   She has tried cutting back to dexilant 48m but she does not think helped her as much as the 629mdosing. She endorses she thinks the lower does of PPI causes her stomach to feel uncomfortable and has generally felt worse since decreasing the dose. She denies dysphagia, no heartburn. She reports her sister has celiac disease. Review of prior workup for multiple upper tract symptoms as above.   Prior workup: Normal gastric emptying study in 2011 US[place]abdomen in 2010 negative.  HIDA 2003 which was abnormal, EF of 18%.    CT 4/17 - diverticulitis  Endoscopic history: EGD 04/2008 - normal esophagus, H pylori gastriits, normal duodenum Colonoscopy in 2012 with a 68m50mescending  colon polyp removed but not retrieved. Colonoscopy  09/30/2015 - seven small colon polyps - adenomas, sessile serrated, recall in 3 years, pancolonic diverticulosis, and hemorrhoids     Past Medical History:  Diagnosis Date  . Asthma   . Edema   . Elevated blood pressure reading without diagnosis of hypertension    "just elevated when I'm in pain" (12/07/2015)  . Fibromyalgia   . Ganglion of joint    right wrist  . GERD (gastroesophageal reflux disease)   . Herpes zoster without mention of complication   . Hyperlipidemia   . IBS (irritable bowel syndrome)   . Nontraumatic rupture of Achilles tendon   . Other acute reactions to stress   . Pain in joint, shoulder region   . Pain in joint, upper arm   . Pain in limb   . Personal history of other diseases of digestive system      Past Surgical History:  Procedure Laterality Date  . ABDOMINAL HYSTERECTOMY    . ACHILLES TENDON REPAIR Right 2005  . BUNIONECTOMY Bilateral    Bunionectomy 1983  . GANGLION CYST EXCISION Right    wrist  . MENISCUS REPAIR Right 04/2014  . TUBAL LIGATION     Family History  Problem Relation Age of Onset  . Hypertension Father   . Diabetes Father   . Heart disease Father   . Kidney disease Father     Died, 50 68 Leukemia Brother   . Cancer Brother     myleoblastic anemia  . Hyperlipidemia Mother   .  Hypertension Mother     Died, 67  . Thyroid disease Mother     Thyroid surgery  . Pernicious anemia Sister   . Thyroid disease Sister     On thyroid Rx  . Lupus Daughter   . Coronary artery disease Brother   . Hypertension Brother    Social History  Substance Use Topics  . Smoking status: Never Smoker  . Smokeless tobacco: Never Used  . Alcohol use No   Current Outpatient Prescriptions  Medication Sig Dispense Refill  . acetaminophen (TYLENOL) 500 MG tablet Take 1,000 mg by mouth every 6 (six) hours as needed for moderate pain.     Marland Kitchen albuterol (PROAIR HFA) 108 (90 Base) MCG/ACT inhaler  USE 2 PUFFS EVERY 4 HOURS AS NEEDED FOR COUGH, WHEEZE OR SHORTNESS OF BREATH. 8.5 each 2  . ALPRAZolam (XANAX) 0.25 MG tablet Take 1 tablet (0.25 mg total) by mouth 3 (three) times daily as needed. 30 tablet 1  . atenolol (TENORMIN) 25 MG tablet Take 12.5 mg by mouth daily.    . Blood Glucose Monitoring Suppl (FREESTYLE FREEDOM) KIT 1 Device by Does not apply route daily. 1 each 0  . Calcium Citrate-Vitamin D3 1000-400 LIQD 1 po qd (Patient taking differently: Take 1 tablet by mouth daily. ) 480 mL 5  . cholecalciferol (VITAMIN D) 1000 units tablet Take 2,000 Units by mouth daily.    Marland Kitchen CLINPRO 5000 1.1 % PSTE See admin instructions.  4  . Dexlansoprazole 30 MG capsule Take 1 capsule (30 mg total) by mouth daily. 90 capsule 3  . diclofenac sodium (VOLTAREN) 1 % GEL Apply 2 g topically daily as needed (pain). For pain    . fexofenadine (ALLEGRA) 180 MG tablet Take 1 tablet (180 mg total) by mouth daily. 90 tablet 3  . fluticasone (FLONASE) 50 MCG/ACT nasal spray PLACE 2 SPRAYS INTO BOTH NOSTRILS DAILY. 16 g 4  . Fluticasone-Salmeterol (ADVAIR DISKUS) 250-50 MCG/DOSE AEPB Inhale 1 puff into the lungs 2 (two) times daily. 60 each 2  . FREESTYLE LITE test strip USE TO CHECK BLOOD SUGAR ONCE A DAY 100 each 6  . Iron Combinations (I.L.X. B-12) ELIX Take 2 drops by mouth daily.    Marland Kitchen lidocaine (LIDODERM) 5 % APPLY 1 PATCH ONTO SKIN EVERY 12 HOURS REMOVE AND DISCARD PATCH WITHIN 12 HOURS OR AS DIRECTED BY MD 30 patch 0  . meclizine (ANTIVERT) 32 MG tablet Take 1 tablet (32 mg total) by mouth 3 (three) times daily as needed for dizziness. 30 tablet 0  . methimazole (TAPAZOLE) 5 MG tablet Take 0.5 tablets (2.5 mg total) by mouth daily. 30 tablet 2  . Probiotic Product (ALIGN PO) Take by mouth.    . valACYclovir (VALTREX) 1000 MG tablet Take 1 tablet (1,000 mg total) by mouth 3 (three) times daily. (Patient taking differently: Take 1,000 mg by mouth as needed. ) 30 tablet 5  . hyoscyamine (LEVSIN, ANASPAZ)  0.125 MG tablet Take 1 tablet (0.125 mg total) by mouth every 4 (four) hours as needed. (Patient not taking: Reported on 05/14/2016) 45 tablet 0   No current facility-administered medications for this visit.    Allergies  Allergen Reactions  . Gentamicin     Eye drops turned the sclera bright red  . Aspirin     REACTION: anaphylaxis  . Doxycycline     REACTION: severe nausea/vomiting  . Ibuprofen     REACTION: anaphylaxsis  . Influenza A (H1n1) Monoval Vac     REACTION:  sick for 3 weeks  . Loratadine Other (See Comments)    Fatigue/weakness  . Metronidazole     REACTION: red face/swelling  . Nsaids     REACTION: anaphylaxis  . Ranitidine Hcl     REACTION: Lips re/peel  . Sulfamethoxazole-Trimethoprim     REACTION: face red/peel  . Tramadol Hcl     REACTION: paranoid     Review of Systems: All systems reviewed and negative except where noted in HPI.   Lab Results  Component Value Date   WBC 7.0 04/20/2016   HGB 13.2 04/20/2016   HCT 39.3 04/20/2016   MCV 85.4 04/20/2016   PLT 268.0 04/20/2016    Lab Results  Component Value Date   ALT 23 04/20/2016   AST 20 04/20/2016   ALKPHOS 82 04/20/2016   BILITOT 0.7 04/20/2016    Lab Results  Component Value Date   CREATININE 0.99 04/20/2016   BUN 9 04/20/2016   NA 140 04/20/2016   K 4.5 04/20/2016   CL 105 04/20/2016   CO2 31 04/20/2016      Physical Exam: BP 110/78   Pulse 68   Ht _0  (1.651 m)   Wt 232 lb (105.2 kg)   BMI 38.61 kg/m  Constitutional: Pleasant,well-developed, female in no acute distress. HEENT: Normocephalic and atraumatic. Conjunctivae are normal. No scleral icterus. Neck supple.  Cardiovascular: Normal rate, regular rhythm.  Pulmonary/chest: Effort normal and breath sounds normal. No wheezing, rales or rhonchi. Abdominal: Soft, nondistended, nontender. There are no masses palpable. No hepatomegaly. Extremities: no edema Lymphadenopathy: No cervical adenopathy noted. Neurological:  Alert and oriented to person place and time. Skin: Skin is warm and dry. No rashes noted. Psychiatric: Normal mood and affect. Behavior is normal.   ASSESSMENT AND PLAN: 55 y/o female with longstanding GERD, here for evaluation of the following issues:  Dyspepsia / early satiety / bloating / GERD - she has a history of H pylori, she thinks recent symptoms remind her of this, but it appears she has had some chronic upper tract complaints for many years. Prior GES negative in 2011. She did have an abnormal HIDA with low ejection fraction of GB in 2003, but her symptoms are not classic for biliary colic. Given her symptoms which have recurred, I do think she warrants H pylori testing but she is not willing to stop her PPI due to worsening reflux symptoms, thus noninvasive testing would not be accurate. In this light I offered her an EGD with biopsies to rule out H pylori, and to rule out celiac (sister reportedly has this), although I think celiac less likely given her ethnicity. If EGD is negative and no evidence of H pylori, I suspect she more than likely may have functional dyspepsia vs. less likely biliary colic, and we discussed this, with options for therapy moving forward. For her bloating in general she will try a low FODMOP diet and she can use hyocyamine she has not yet tried for cramps related to her bloating. For her GERD, we discussed risks / benefits of long term PPI use and recommend lowest dose of PPI needed to control symptoms. She reports her quality of life was much better on higher dose Dexilant as it worked well for her and strongly wished to use higher dose at 3m for now. Refilled for her, will await EGD.   Diverticulitis - admitted in April, mild recurrence in May, since then doing well. Declined previous offer for surgical evaluation. Fiber supplement daily if possible,  call if recurrence of symptoms.   Nisland Cellar, MD [facility] Pager [phone]

## 2016-05-17 ENCOUNTER — Encounter: Payer: Self-pay | Admitting: Gastroenterology

## 2016-05-17 ENCOUNTER — Ambulatory Visit (AMBULATORY_SURGERY_CENTER): Payer: BC Managed Care – PPO | Admitting: Gastroenterology

## 2016-05-17 VITALS — BP 119/83 | HR 63 | Temp 97.8°F | Resp 17 | Ht 65.0 in | Wt 232.0 lb

## 2016-05-17 DIAGNOSIS — K317 Polyp of stomach and duodenum: Secondary | ICD-10-CM

## 2016-05-17 DIAGNOSIS — R1013 Epigastric pain: Secondary | ICD-10-CM | POA: Diagnosis present

## 2016-05-17 DIAGNOSIS — K2970 Gastritis, unspecified, without bleeding: Secondary | ICD-10-CM | POA: Diagnosis not present

## 2016-05-17 DIAGNOSIS — K319 Disease of stomach and duodenum, unspecified: Secondary | ICD-10-CM

## 2016-05-17 DIAGNOSIS — B9681 Helicobacter pylori [H. pylori] as the cause of diseases classified elsewhere: Secondary | ICD-10-CM | POA: Diagnosis not present

## 2016-05-17 MED ORDER — SODIUM CHLORIDE 0.9 % IV SOLN: 500.0000 mL | INTRAVENOUS | Status: DC

## 2016-05-17 NOTE — Patient Instructions (Signed)
YOU HAD AN ENDOSCOPIC PROCEDURE TODAY AT THE [facility]:   Refer to the procedure report that was given to you for any specific questions about what was found during the examination.  If the procedure report does not answer your questions, please call your gastroenterologist to clarify.  If you requested that your care partner not be given the details of your procedure findings, then the procedure report has been included in a sealed envelope for you to review at your convenience later.  YOU SHOULD EXPECT: Some feelings of bloating in the abdomen. Passage of more gas than usual.  Walking can help get rid of the air that was put into your GI tract during the procedure and reduce the bloating. If you had a lower endoscopy (such as a colonoscopy or flexible sigmoidoscopy) you may notice spotting of blood in your stool or on the toilet paper. If you underwent a bowel prep for your procedure, you may not have a normal bowel movement for a few days.  Please Note:  You might notice some irritation and congestion in your nose or some drainage.  This is from the oxygen used during your procedure.  There is no need for concern and it should clear up in a day or so.  SYMPTOMS TO REPORT IMMEDIATELY:    Following upper endoscopy (EGD)  Vomiting of blood or coffee ground material  New chest pain or pain under the shoulder blades  Painful or persistently difficult swallowing  New shortness of breath  Fever of 100F or higher  Black, tarry-looking stools  For urgent or emergent issues, a gastroenterologist can be reached at any hour by calling [phone].   DIET:  We do recommend a small meal at first, but then you may proceed to your regular diet.  Drink plenty of fluids but you should avoid alcoholic beverages for 24 hours.  ACTIVITY:  You should plan to take it easy for the rest of today and you should NOT DRIVE or use heavy machinery until tomorrow (because of the sedation medicines used  during the test).    FOLLOW UP: Our staff will call the number listed on your records the next business day following your procedure to check on you and address any questions or concerns that you may have regarding the information given to you following your procedure. If we do not reach you, we will leave a message.  However, if you are feeling well and you are not experiencing any problems, there is no need to return our call.  We will assume that you have returned to your regular daily activities without incident.  If any biopsies were taken you will be contacted by phone or by letter within the next 1-3 weeks.  Please call us at [phone] if you have not heard about the biopsies in 3 weeks.    SIGNATURES/CONFIDENTIALITY: You and/or your care partner have signed paperwork which will be entered into your electronic medical record.  These signatures attest to the fact that that the information above on your After Visit Summary has been reviewed and is understood.  Full responsibility of the confidentiality of this discharge information lies with you and/or your care-partner.   Await biopsy results. 

## 2016-05-17 NOTE — Progress Notes (Signed)
Spontaneous respirations throughout. VSS. Resting comfortably. To PACU on room air. Report to  Penny RN.  

## 2016-05-17 NOTE — Op Note (Signed)
[name] Procedure Date: 05/17/2016 1:28 PM MRN: [id number] Endoscopist: Remo Lipps P. Havery Moros , MD Age: 55 Referring MD:  Date of Birth: 01-20-1961 Gender: Female Account #: [id number] Procedure:                Upper GI endoscopy Indications:              Dyspepsia, Abdominal bloating, Early satiety-                            history of H pylori, family history of celiac                            disease reported Medicines:                Monitored Anesthesia Care Procedure:                Pre-Anesthesia Assessment:                           - Prior to the procedure, a History and Physical                            was performed, and patient medications and                            allergies were reviewed. The patient's tolerance of                            previous anesthesia was also reviewed. The risks                            and benefits of the procedure and the sedation                            options and risks were discussed with the patient.                            All questions were answered, and informed consent                            was obtained. Prior Anticoagulants: The patient has                            taken no previous anticoagulant or antiplatelet                            agents. ASA Grade Assessment: II - A patient with                            mild systemic disease. After reviewing the risks                            and benefits, the patient was deemed in  satisfactory condition to undergo the procedure.                           After obtaining informed consent, the endoscope was                            passed under direct vision. Throughout the                            procedure, the patient's blood pressure, pulse, and                            oxygen saturations were monitored continuously. The                            Model GIF-HQ190 [id number]) scope was  introduced                            through the mouth, and advanced to the second part                            of duodenum. The upper GI endoscopy was                            accomplished without difficulty. The patient                            tolerated the procedure well. Scope In: Scope Out: Findings:                 Esophagogastric landmarks were identified: the                            Z-line was found at 38 cm, the gastroesophageal                            junction was found at 38 cm and the upper extent of                            the gastric folds was found at 38 cm from the                            incisors.                           The exam of the esophagus was otherwise normal.                           Two 3 mm benign appearing sessile polyps were found                            in the gastric fundus. Biopsies were taken with a  cold forceps for histology.                           Patchy mildly erythematous mucosa was found in the                            gastric body without focal ulceration / erosion.                            Biopsies were taken with a cold forceps for                            Helicobacter pylori testing from the antrum and body                           The exam of the stomach was otherwise normal.                           The duodenal bulb and second portion of the                            duodenum were normal. Biopsies for histology were                            taken with a cold forceps for evaluation of celiac                            disease. Part of the ampulla was visualized and                            seemed slightly prominent, biopsies taken to ensure                            no adenomatous change. Complications:            No immediate complications. Estimated blood loss:                            Minimal. Estimated Blood Loss:     Estimated blood loss was minimal. Impression:                - Esophagogastric landmarks identified.                           - Normal esophagus                           - Two benign appearing gastric polyps. Biopsied.                           - Erythematous mucosa in the gastric body. Biopsies                            obtained to rule out H pylori.                           -  Normal duodenal bulb and second portion of the                            duodenum. Biopsied to rule out celiac.                           - Prominent ampulla - biopsies obtained to ensure                            no adenomatous changes. Recommendation:           - Patient has a contact number available for                            emergencies. The signs and symptoms of potential                            delayed complications were discussed with the                            patient. Return to normal activities tomorrow.                            Written discharge instructions were provided to the                            patient.                           - Resume previous diet.                           - Continue present medications.                           - Await pathology results. Remo Lipps P. Armbruster, MD 05/17/2016 1:54:10 PM This report has been signed electronically.

## 2016-05-18 ENCOUNTER — Telehealth: Payer: Self-pay | Admitting: *Deleted

## 2016-05-18 NOTE — Telephone Encounter (Signed)
  Follow up Call-  Call back number 05/17/2016 09/30/2015  Post procedure Call Back phone  # [phone] cell [phone]  Permission to leave phone message Yes -  Some recent data might be hidden     Patient questions:  Left message to call us if necessary.

## 2016-05-21 ENCOUNTER — Telehealth: Payer: Self-pay

## 2016-05-21 NOTE — Telephone Encounter (Signed)
  Follow up Call-  Call back number 05/17/2016 09/30/2015  Post procedure Call Back phone  # [phone] cell [phone]  Permission to leave phone message Yes -  Some recent data might be hidden    Patient was called for follow up after her procedure on 05/17/2016. No answer at the numbers given for follow up phone call. This was the second attempt to contact the patient. A message was left on the answering machine.

## 2016-05-23 ENCOUNTER — Encounter: Payer: Self-pay | Admitting: Gastroenterology

## 2016-05-23 ENCOUNTER — Other Ambulatory Visit: Payer: Self-pay

## 2016-05-23 MED ORDER — METHIMAZOLE 5 MG PO TABS: 2.5000 mg | ORAL_TABLET | Freq: Every day | ORAL | 2 refills | Status: DC

## 2016-05-24 ENCOUNTER — Other Ambulatory Visit: Payer: Self-pay

## 2016-05-24 MED ORDER — METHIMAZOLE 5 MG PO TABS: 5.0000 mg | ORAL_TABLET | Freq: Every day | ORAL | 1 refills | Status: DC

## 2016-06-01 ENCOUNTER — Telehealth: Payer: Self-pay | Admitting: Internal Medicine

## 2016-06-01 ENCOUNTER — Other Ambulatory Visit: Payer: Self-pay

## 2016-06-01 ENCOUNTER — Encounter: Payer: Self-pay | Admitting: Gastroenterology

## 2016-06-01 MED ORDER — AMOXICILLIN 500 MG PO TABS: 1000.0000 mg | ORAL_TABLET | Freq: Two times a day (BID) | ORAL | 0 refills | Status: AC

## 2016-06-01 MED ORDER — CLARITHROMYCIN 500 MG PO TABS: 500.0000 mg | ORAL_TABLET | Freq: Two times a day (BID) | ORAL | 0 refills | Status: AC

## 2016-06-01 NOTE — Progress Notes (Signed)
Letter sent 10/13.jf

## 2016-06-01 NOTE — Telephone Encounter (Signed)
Calling about H pylori Tx Not at pharmacy Chart review shows that it is not because of allergies to tetracycline (in [place])  I sent Rx for Biaxin and amoxicillin x 14 days to treat H pylori  She will need to be called about when to stop PPI for 2 weeks to have stool Ag for H pylori to check for eradication

## 2016-06-04 ENCOUNTER — Other Ambulatory Visit: Payer: Self-pay

## 2016-06-04 NOTE — Telephone Encounter (Signed)
Thanks Glendell Docker. I had not seen this message as I was out of the office, thanks for taking care of it.  Almyra Free please see recommendations. Thanks

## 2016-06-05 NOTE — Telephone Encounter (Signed)
Have left a message for patient to call. She has not returned the call, previous letter stated that she will need to come to lab and pick up stool specimen container. Will need to hold her Dexilant for two weeks prior to submitting a stool sample to check for h pylori.

## 2016-06-10 ENCOUNTER — Other Ambulatory Visit: Payer: Self-pay | Admitting: Family Medicine

## 2016-06-18 ENCOUNTER — Other Ambulatory Visit: Payer: Self-pay | Admitting: Family Medicine

## 2016-06-18 NOTE — Telephone Encounter (Signed)
Patient is requesting to get Nebulizer treatments ordered. She would also like to know where she can buy the tubing for her nebulizer. Please advise.   Pharmacy:[id number] [address], [place]  Patient phone: [phone]

## 2016-06-20 NOTE — Telephone Encounter (Signed)
Spoke with pt. She states that she started having chest discomfort and wheezing on Sunday. Used her advair and albuterol and felt better by Monday. States she has a nebulizer machine that was prescribed in the past by Dr Annamaria Boots. She stopped going to Dr Annamaria Boots and states she has been seeing her PCP for her Asthma. Only uses Advair once a day and albuterol as needed. Pt is requesting rx for nebulizer tubing and medication to have on hand if needed. Please advise?

## 2016-06-21 MED ORDER — ALBUTEROL SULFATE (2.5 MG/3ML) 0.083% IN NEBU: 2.5000 mg | INHALATION_SOLUTION | Freq: Four times a day (QID) | RESPIRATORY_TRACT | 3 refills | Status: DC | PRN

## 2016-06-21 NOTE — Telephone Encounter (Signed)
Pt has not been seen recently for asthma If she is struggling now she may need ov She should use advair bid Ok to call in albuterol for neb  Qid prn  1 box     3 refills Tubing through co she got neb from

## 2016-06-21 NOTE — Telephone Encounter (Signed)
Medication filled to pharmacy as requested. Pt notified. Pt states she is currently feeling much better. Pt also advised to contact company that she received nebulizer from for tubing.Pt has no further questions or concerns at this time.

## 2016-06-27 ENCOUNTER — Encounter: Payer: Self-pay | Admitting: Internal Medicine

## 2016-06-28 ENCOUNTER — Other Ambulatory Visit: Payer: Self-pay

## 2016-06-28 ENCOUNTER — Other Ambulatory Visit (INDEPENDENT_AMBULATORY_CARE_PROVIDER_SITE_OTHER): Payer: BC Managed Care – PPO

## 2016-06-28 ENCOUNTER — Ambulatory Visit: Payer: BC Managed Care – PPO | Admitting: Internal Medicine

## 2016-06-28 ENCOUNTER — Telehealth: Payer: Self-pay

## 2016-06-28 ENCOUNTER — Other Ambulatory Visit: Payer: BC Managed Care – PPO

## 2016-06-28 DIAGNOSIS — E0500 Thyrotoxicosis with diffuse goiter without thyrotoxic crisis or storm: Secondary | ICD-10-CM | POA: Diagnosis not present

## 2016-06-28 DIAGNOSIS — A048 Other specified bacterial intestinal infections: Secondary | ICD-10-CM

## 2016-06-28 LAB — T4, FREE: Free T4: 0.74 ng/dL (ref 0.60–1.60)

## 2016-06-28 LAB — T3, FREE: T3 FREE: 2.8 pg/mL (ref 2.3–4.2)

## 2016-06-28 LAB — TSH: TSH: 1.06 u[IU]/mL (ref 0.35–4.50)

## 2016-06-28 NOTE — Telephone Encounter (Signed)
Attempted to contact patient regarding appointment today. Patient is only needed for labs, but if appointment is needed to be kept to see Dr.Gherghe that is fine, if not only labs needed at this time. Girls up front notified.

## 2016-07-30 ENCOUNTER — Other Ambulatory Visit: Payer: BC Managed Care – PPO

## 2016-07-30 DIAGNOSIS — A048 Other specified bacterial intestinal infections: Secondary | ICD-10-CM

## 2016-07-31 LAB — HELICOBACTER PYLORI  SPECIAL ANTIGEN: H. PYLORI ANTIGEN STOOL: DETECTED

## 2016-08-06 ENCOUNTER — Telehealth: Payer: Self-pay | Admitting: Gastroenterology

## 2016-08-06 ENCOUNTER — Other Ambulatory Visit: Payer: Self-pay

## 2016-08-06 DIAGNOSIS — A048 Other specified bacterial intestinal infections: Secondary | ICD-10-CM

## 2016-08-06 MED ORDER — LEVOFLOXACIN 500 MG PO TABS
500.0000 mg | ORAL_TABLET | Freq: Every day | ORAL | 0 refills | Status: DC
Start: 2016-08-06 — End: 2016-09-28

## 2016-08-06 MED ORDER — AMOXICILLIN 500 MG PO CAPS: 1000.0000 mg | ORAL_CAPSULE | Freq: Two times a day (BID) | ORAL | 0 refills | Status: AC

## 2016-08-06 NOTE — Telephone Encounter (Signed)
Left message for pt to return call. If not able to speak with me, instructed to leave what medication she needs a refill of and what pharmacy would she like for it to be sent to.

## 2016-08-08 NOTE — Telephone Encounter (Signed)
Left message for pt to return call.

## 2016-08-09 NOTE — Telephone Encounter (Signed)
Left message for pt to return call. When pt calls back please ask her what antibiotic she is requesting and what pharmacy she needs it sent to. Thank you- (we keep playing phone tag).

## 2016-08-09 NOTE — Telephone Encounter (Signed)
Pt said she is returning your call °

## 2016-08-10 NOTE — Telephone Encounter (Signed)
Spoke with pt. She has picked up medication.

## 2016-08-27 ENCOUNTER — Ambulatory Visit: Payer: BC Managed Care – PPO | Admitting: Internal Medicine

## 2016-09-07 ENCOUNTER — Other Ambulatory Visit: Payer: Self-pay | Admitting: Family Medicine

## 2016-09-12 ENCOUNTER — Other Ambulatory Visit: Payer: Self-pay | Admitting: Family Medicine

## 2016-09-14 ENCOUNTER — Telehealth: Payer: Self-pay | Admitting: Cardiovascular Disease

## 2016-09-14 NOTE — Telephone Encounter (Signed)
New Message  Pt c/o of Chest Pain: STAT if CP now or developed within 24 hours  1. Are you having CP right now? no  2. Are you experiencing any other symptoms (ex. SOB, nausea, vomiting, sweating)?   3. How long have you been experiencing CP? Per pt yesterday, but last night got worst   4. Is your CP continuous or coming and going? Coming and going   5. Have you taken Nitroglycerin? No per pt took and Atenolol  Pt would like to speak with RN to see if she need to be seen. Please call back to discuss  ?

## 2016-09-14 NOTE — Telephone Encounter (Signed)
Let me know on this refill.  Is not on current list, but is on historical and was last filled in August 2017, thanks

## 2016-09-14 NOTE — Telephone Encounter (Signed)
Left message for patient to call back  

## 2016-09-18 NOTE — Telephone Encounter (Signed)
Patient returned call back to our office. Patient complained of chest pain that started last Thursday, off and on, while she was at work and lasted until late Thursday night. Patient stated that her pain stopped 30 minutes after she took atenolol that her Endocrinologist, Dr. Cruzita Lederer, prescribed a while back (01/13/16) and had discontinued (04/27/16). Patient stated she has been taking atenolol 25 mg BID since Thursday and feels fine. Patient stated she feels like her heart rate and BP is fine and regular at this time. Patient stated at the time she was having CP, her heart rate was irregular. Patient stated she refused to go to the ED at the time, because she had been at the hospital a lot with her daughter. Patient has not been seen by Dr. Johnsie Cancel since 10/2013. Patient was to follow-up as needed. Made patient an appointment for 09/28/16 as a new patient, and instructed patient to go to ED or call 911 if she starts to have CP again. Will forward to Dr. Johnsie Cancel, for further advisement.

## 2016-09-18 NOTE — Telephone Encounter (Signed)
Left third message for patient to call back. 

## 2016-09-24 ENCOUNTER — Other Ambulatory Visit: Payer: Self-pay

## 2016-09-24 ENCOUNTER — Telehealth: Payer: Self-pay | Admitting: Gastroenterology

## 2016-09-24 NOTE — Progress Notes (Signed)
  Cardiology Office Note   Date:  09/28/2016   ID:  Emily Osborne, DOB 08/30/1960, MRN [id number]  PCP:  Emily R Lowne [name], DO  Cardiologist:   Peter Nishan, MD   Chief Complaint  Patient presents with  . Routine OV  . Chest Pain      History of Present Illness: Emily Osborne is a 56 y.o. female who presents for chest pain. Has not bee seen since 2015 Saw Dr McLean in 2009 with normal ETT  F/U ETT also normal 2015 History of GERD/H pylori  now on Dexilant CRF;s borderline HTN and elevated lipids   CT 11/2015 Diverticulitis  Colon: multiple polyps  EGD: 05/17/16 benign some gastric polyps   She is very concerned about her pain Center chest and clearly improved with atenolol And lower HR. Pain tends to be exertional She cannot have further GI w/u until cleared Rolaids did not help pain   Sees Dr Armbruster for GI now  Past Medical History:  Diagnosis Date  . Allergy   . Asthma    pt has inhaler  . Cataract    bil eyes  . Edema   . Elevated blood pressure reading without diagnosis of hypertension    "just elevated when I'm in pain" (12/07/2015)  . Fibromyalgia   . Ganglion of joint    right wrist  . GERD (gastroesophageal reflux disease)   . Herpes zoster without mention of complication   . Hyperlipidemia   . IBS (irritable bowel syndrome)   . Nontraumatic rupture of Achilles tendon   . Other acute reactions to stress   . Pain in joint, shoulder region   . Pain in joint, upper arm   . Pain in limb   . Personal history of other diseases of digestive system   . Thyroid disease    hyperthyroidism    Past Surgical History:  Procedure Laterality Date  . ABDOMINAL HYSTERECTOMY    . ACHILLES TENDON REPAIR Right 2005  . BUNIONECTOMY Bilateral    Bunionectomy 1983  . GANGLION CYST EXCISION Right    wrist  . MENISCUS REPAIR Right 04/2014  . TUBAL LIGATION    . wisdomteeth extraction       Current Outpatient Prescriptions  Medication Sig Dispense Refill  .  acetaminophen (TYLENOL) 500 MG tablet Take 1,000 mg by mouth every 6 (six) hours as needed for moderate pain.     . albuterol (PROAIR HFA) 108 (90 Base) MCG/ACT inhaler USE 2 PUFFS EVERY 4 HOURS AS NEEDED FOR COUGH, WHEEZE OR SHORTNESS OF BREATH. 8.5 each 2  . albuterol (PROVENTIL) (2.5 MG/3ML) 0.083% nebulizer solution Take 3 mLs (2.5 mg total) by nebulization every 6 (six) hours as needed for wheezing or shortness of breath. 75 mL 3  . ALPRAZolam (XANAX) 0.25 MG tablet Take 1 tablet (0.25 mg total) by mouth 3 (three) times daily as needed. 30 tablet 0  . amoxicillin-clavulanate (AUGMENTIN) 875-125 MG tablet Take 1 tablet by mouth 2 (two) times daily. 20 tablet 0  . Blood Glucose Monitoring Suppl (FREESTYLE FREEDOM) KIT 1 Device by Does not apply route daily. 1 each 0  . Calcium Carb-Cholecalciferol (CALCIUM 1000 + D PO) Take 1 tablet by mouth daily.    . cholecalciferol (VITAMIN D) 1000 units tablet Take 2,000 Units by mouth daily.    . CLINPRO 5000 1.1 % PSTE See admin instructions.  4  . dexlansoprazole (DEXILANT) 60 MG capsule Take 1 capsule (60 mg total) by mouth daily.   90 capsule 1  . diclofenac sodium (VOLTAREN) 1 % GEL Apply 2 g topically daily as needed (pain). For pain    . fexofenadine (ALLEGRA) 180 MG tablet Take 1 tablet (180 mg total) by mouth daily. 90 tablet 3  . fluticasone (FLONASE) 50 MCG/ACT nasal spray PLACE 2 SPRAYS INTO BOTH NOSTRILS DAILY. 16 g 4  . Fluticasone-Salmeterol (ADVAIR DISKUS) 250-50 MCG/DOSE AEPB Inhale 1 puff into the lungs 2 (two) times daily. 60 each 2  . FREESTYLE LITE test strip USE TO CHECK BLOOD SUGAR ONCE A DAY 100 each 6  . hyoscyamine (LEVSIN, ANASPAZ) 0.125 MG tablet Take 1 tablet (0.125 mg total) by mouth every 4 (four) hours as needed. 45 tablet 0  . Iron Combinations (I.L.X. B-12) ELIX Take 2 drops by mouth daily.    . lidocaine (LIDODERM) 5 % APPLY 1 PATCH ON THE SKIN EVERY 12 HRS REMOVE, DISCARD PATCH WITHIN 12 HRS OR AS DIRECTED 30 patch 0  .  meclizine (ANTIVERT) 32 MG tablet Take 1 tablet (32 mg total) by mouth 3 (three) times daily as needed for dizziness. 30 tablet 0  . methimazole (TAPAZOLE) 5 MG tablet Take 2.5 mg by mouth daily.  1  . Probiotic Product (ALIGN PO) Take by mouth.    . triamcinolone cream (KENALOG) 0.1 % APPLY TO AFFECTED AREA TWICE A DAY 30 g 0  . valACYclovir (VALTREX) 1000 MG tablet Take 1 tablet (1,000 mg total) by mouth 3 (three) times daily. 30 tablet 5   Current Facility-Administered Medications  Medication Dose Route Frequency Provider Last Rate Last Dose  . 0.9 %  sodium chloride infusion  500 mL Intravenous Continuous Steven Paul Armbruster, MD        Allergies:   Aspirin; Gentamicin; Ibuprofen; Nsaids; Doxycycline; Influenza a (h1n1) monoval vac; Loratadine; Metronidazole; Ranitidine hcl; Sulfamethoxazole-trimethoprim; and Tramadol hcl    Social History:  The patient  reports that she has never smoked. She has never used smokeless tobacco. She reports that she does not drink alcohol or use drugs.   Family History:  The patient's family history includes Cancer in her brother; Coronary artery disease in her brother; Diabetes in her father; Heart disease in her father; Hyperlipidemia in her mother; Hypertension in her brother, father, and mother; Kidney disease in her father; Leukemia in her brother; Lupus in her daughter; Pernicious anemia in her sister; Thyroid disease in her mother and sister.    ROS:  Please see the history of present illness.   Otherwise, review of systems are positive for none.   All other systems are reviewed and negative.    PHYSICAL EXAM: VS:  BP 130/80   Pulse 77   Ht 5' 5" (1.651 m)   Wt 232 lb (105.2 kg)   SpO2 98%   BMI 38.61 kg/m  , BMI Body mass index is 38.61 kg/m. Affect appropriate Healthy:  appears stated age HEENT: normal Neck supple with no adenopathy JVP normal no bruits no thyromegaly Lungs clear with no wheezing and good diaphragmatic motion Heart:   S1/S2 no murmur, no rub, gallop or click PMI normal Abdomen: benighn, BS positve, no tenderness, no AAA no bruit.  No HSM or HJR Distal pulses intact with no bruits No edema Neuro non-focal Skin warm and dry No muscular weakness    EKG:  12/07/15 SR normal ECG   09/28/16 SR rate 54 normal    Recent Labs: 02/06/2016: Magnesium 2.0 06/28/2016: TSH 1.06 09/28/2016: ALT 25; BUN 8; Creatinine, Ser 0.92; Hemoglobin   13.2; Platelets 263.0; Potassium 4.2; Sodium 140    Lipid Panel    Component Value Date/Time   CHOL 201 (H) 03/23/2016 1501   TRIG 180 (H) 03/23/2016 1501   HDL 54 03/23/2016 1501   CHOLHDL 3.7 03/23/2016 1501   VLDL 36 (H) 03/23/2016 1501   LDLCALC 111 03/23/2016 1501   LDLDIRECT 139.4 12/26/2011 0905      Wt Readings from Last 3 Encounters:  09/28/16 232 lb (105.2 kg)  05/17/16 232 lb (105.2 kg)  05/14/16 232 lb (105.2 kg)      Other studies Reviewed: Additional studies/ records that were reviewed today include: Notes from primary Previous ETT 2009 and 2015 old cardiology notes Notes and EGD GI Dr Armbruster.    ASSESSMENT AND PLAN:  1.  Chest Pain concern for angina previously negative w/u No help with GI Rx And improved with atenolol Discussed options and favor cath. Risks including Stroke , MI, bleeding contrast reaction and surgery discussed. Favor dye load While still on tapazole so thyroid will not be effected.  Lab called orders written Labs done by primary this am should suffice  2. Dyspepsia continue dexilent f/u Armbruster post cath for further w/u and EGD  3. Asthma no active wheezing continue inhalers 4. Chol  Lab Results  Component Value Date   LDLCALC 111 03/23/2016   5. Thyroid:  Being Rx for hyperthyroidism Continue tapazole do not stop until after contrast load for cath and abdominal CT TSH and f/u with Dr Gherge continue atenolol    Current medicines are reviewed at length with the patient today.  The patient does not have concerns  regarding medicines.  The following changes have been made:  no change  Labs/ tests ordered today include: Notes primary Dr Mclean labs ETT and ECG;s   Orders Placed This Encounter  Procedures  . EKG 12-Lead     Disposition:   FU with me post cath      Signed, Peter Nishan, MD  09/28/2016 12:28 PM    [facility] [address], [place], [place]  [address] Phone: [phone]; Fax: [phone]  

## 2016-09-24 NOTE — Telephone Encounter (Signed)
Reminded patient it is a two full weeks (14 days) that she will need to be off of Dexilant. Told her that she could pick up the specimen container now and on day 14 could bring the stool sample back in and could then take her Dexilant after bringing sample.

## 2016-09-27 ENCOUNTER — Telehealth: Payer: Self-pay | Admitting: Gastroenterology

## 2016-09-27 NOTE — Telephone Encounter (Signed)
Patient called states that she is having some chills and midlower abdominal pain, especially upon urination, some when she has a bm. States that she had abnormal light colored bm over weekend, but now back to normal. I asked her to contact her PCP to have them address the urinary pain. She will submit her stool sample on 2/12 to see if she has been successfully treated for H. Pylori.

## 2016-09-28 ENCOUNTER — Other Ambulatory Visit: Payer: Self-pay

## 2016-09-28 ENCOUNTER — Ambulatory Visit (INDEPENDENT_AMBULATORY_CARE_PROVIDER_SITE_OTHER): Payer: BC Managed Care – PPO | Admitting: Family Medicine

## 2016-09-28 ENCOUNTER — Ambulatory Visit (HOSPITAL_BASED_OUTPATIENT_CLINIC_OR_DEPARTMENT_OTHER)
Admission: RE | Admit: 2016-09-28 | Discharge: 2016-09-28 | Disposition: A | Discharge status: Home / Self Care | Payer: BC Managed Care – PPO | Source: Ambulatory Visit | Attending: Family Medicine | Admitting: Family Medicine

## 2016-09-28 ENCOUNTER — Encounter: Payer: Self-pay | Admitting: Cardiovascular Disease

## 2016-09-28 ENCOUNTER — Telehealth: Payer: Self-pay | Admitting: *Deleted

## 2016-09-28 ENCOUNTER — Encounter (HOSPITAL_BASED_OUTPATIENT_CLINIC_OR_DEPARTMENT_OTHER): Payer: Self-pay

## 2016-09-28 ENCOUNTER — Telehealth: Payer: Self-pay | Admitting: Gastroenterology

## 2016-09-28 ENCOUNTER — Ambulatory Visit (INDEPENDENT_AMBULATORY_CARE_PROVIDER_SITE_OTHER): Payer: BC Managed Care – PPO | Admitting: Cardiovascular Disease

## 2016-09-28 VITALS — BP 130/80 | HR 77 | Ht 65.0 in | Wt 232.0 lb

## 2016-09-28 VITALS — BP 118/86 | HR 66 | Temp 98.1°F | Resp 16 | Ht 65.0 in | Wt 233.0 lb

## 2016-09-28 DIAGNOSIS — K5792 Diverticulitis of intestine, part unspecified, without perforation or abscess without bleeding: Secondary | ICD-10-CM | POA: Diagnosis not present

## 2016-09-28 DIAGNOSIS — R1030 Lower abdominal pain, unspecified: Secondary | ICD-10-CM

## 2016-09-28 DIAGNOSIS — K639 Disease of intestine, unspecified: Secondary | ICD-10-CM | POA: Diagnosis not present

## 2016-09-28 DIAGNOSIS — I10 Essential (primary) hypertension: Secondary | ICD-10-CM

## 2016-09-28 DIAGNOSIS — Z0000 Encounter for general adult medical examination without abnormal findings: Secondary | ICD-10-CM

## 2016-09-28 DIAGNOSIS — R079 Chest pain, unspecified: Secondary | ICD-10-CM | POA: Diagnosis not present

## 2016-09-28 LAB — COMPREHENSIVE METABOLIC PANEL
ALBUMIN: 4.1 g/dL (ref 3.5–5.2)
ALK PHOS: 75 U/L (ref 39–117)
ALT: 25 U/L (ref 0–35)
AST: 20 U/L (ref 0–37)
BUN: 8 mg/dL (ref 6–23)
CALCIUM: 9.5 mg/dL (ref 8.4–10.5)
CO2: 31 mEq/L (ref 19–32)
Chloride: 104 mEq/L (ref 96–112)
Creatinine, Ser: 0.92 mg/dL (ref 0.40–1.20)
GFR: 81.01 mL/min (ref >60.00)
Glucose, Bld: 105 mg/dL — ABNORMAL HIGH (ref 70–99)
POTASSIUM: 4.2 meq/L (ref 3.5–5.1)
SODIUM: 140 meq/L (ref 135–145)
TOTAL PROTEIN: 7.1 g/dL (ref 6.0–8.3)
Total Bilirubin: 0.5 mg/dL (ref 0.2–1.2)

## 2016-09-28 LAB — CBC
HCT: 39.3 % (ref 36.0–46.0)
HEMOGLOBIN: 13.2 g/dL (ref 12.0–15.0)
MCHC: 33.6 g/dL (ref 30.0–36.0)
MCV: 87.4 fl (ref 78.0–100.0)
Platelets: 263 10*3/uL (ref 150.0–400.0)
RBC: 4.5 Mil/uL (ref 3.87–5.11)
RDW: 13.9 % (ref 11.5–15.5)
WBC: 7.5 10*3/uL (ref 4.0–10.5)

## 2016-09-28 LAB — POC URINALSYSI DIPSTICK (AUTOMATED)
BILIRUBIN UA: NEGATIVE
Blood, UA: NEGATIVE
GLUCOSE UA: NEGATIVE
KETONES UA: NEGATIVE
Leukocytes, UA: NEGATIVE
Nitrite, UA: NEGATIVE
Protein, UA: NEGATIVE
SPEC GRAV UA: 1.015
UROBILINOGEN UA: NEGATIVE
pH, UA: 6

## 2016-09-28 MED ORDER — ALPRAZOLAM 0.25 MG PO TABS: 0.2500 mg | ORAL_TABLET | Freq: Three times a day (TID) | ORAL | 0 refills | Status: DC | PRN

## 2016-09-28 MED ORDER — IOPAMIDOL (ISOVUE-300) INJECTION 61%
100.0000 mL | Freq: Once | INTRAVENOUS | Status: AC | PRN
  Administered 2016-09-28: 100 mL via INTRAVENOUS

## 2016-09-28 MED ORDER — AMOXICILLIN-POT CLAVULANATE 875-125 MG PO TABS: 1.0000 | ORAL_TABLET | Freq: Two times a day (BID) | ORAL | 0 refills | Status: DC

## 2016-09-28 NOTE — Progress Notes (Signed)
Pre visit review using our clinic review tool, if applicable. No additional management support is needed unless otherwise documented below in the visit note. 

## 2016-09-28 NOTE — Telephone Encounter (Signed)
Advised patient per Dr. Etter Sjogren that her scan did show diverticulitis and to make sure she take the prescription that was given as prescribed.

## 2016-09-28 NOTE — Patient Instructions (Signed)

## 2016-09-28 NOTE — Patient Instructions (Addendum)
Medication Instructions:  Your physician recommends that you continue on your current medications as directed. Please refer to the Current Medication list given to you today.  Labwork: NONE  Testing/Procedures: Your physician has requested that you have a cardiac catheterization. Cardiac catheterization is used to diagnose and/or treat various heart conditions. Doctors may recommend this procedure for a number of different reasons. The most common reason is to evaluate chest pain. Chest pain can be a symptom of coronary artery disease (CAD), and cardiac catheterization can show whether plaque is narrowing or blocking your heart's arteries. This procedure is also used to evaluate the valves, as well as measure the blood flow and oxygen levels in different parts of your heart. For further information please visit [email or web address]. Please follow instruction sheet, as given.  Follow-Up: Your physician recommends that you schedule a follow-up appointment in: 3 weeks with a PA or NP for post heart cath.  Your physician wants you to follow-up in: 6 months with Dr. Johnsie Cancel. You will receive a reminder letter in the mail two months in advance. If you don't receive a letter, please call our office to schedule the follow-up appointment.   If you need a refill on your cardiac medications before your next appointment, please call your pharmacy.

## 2016-09-28 NOTE — Progress Notes (Signed)
Subjective:    Patient ID: Emily Osborne, female    DOB: Mar 11, 1961, 56 y.o.   MRN: [id number]  I acted as a Education administrator for Dr. Royden Purl, LPN   Chief Complaint  Patient presents with  . Abdominal Pain    pt c/o lower abdominal pain with urination, and bowel movement, along with back pain for about     Urinary Tract Infection   This is a new problem. The current episode started in the past 7 days. The problem occurs every urination. The quality of the pain is described as aching. The pain is at a severity of 5/10. The pain is moderate. There has been no fever. She is not sexually active. There is no history of pyelonephritis. Associated symptoms include chills. Pertinent negatives include no vomiting.    Patient is in today for lower abdominal pain, back pain when urinating since 1 week.  Past Medical History:  Diagnosis Date  . Allergy   . Asthma    pt has inhaler  . Cataract    bil eyes  . Edema   . Elevated blood pressure reading without diagnosis of hypertension    "just elevated when I'm in pain" (12/07/2015)  . Fibromyalgia   . Ganglion of joint    right wrist  . GERD (gastroesophageal reflux disease)   . Herpes zoster without mention of complication   . Hyperlipidemia   . IBS (irritable bowel syndrome)   . Nontraumatic rupture of Achilles tendon   . Other acute reactions to stress   . Pain in joint, shoulder region   . Pain in joint, upper arm   . Pain in limb   . Personal history of other diseases of digestive system   . Thyroid disease    hyperthyroidism    Past Surgical History:  Procedure Laterality Date  . ABDOMINAL HYSTERECTOMY    . ACHILLES TENDON REPAIR Right 2005  . BUNIONECTOMY Bilateral    Bunionectomy 1983  . GANGLION CYST EXCISION Right    wrist  . MENISCUS REPAIR Right 04/2014  . TUBAL LIGATION    . wisdomteeth extraction      Family History  Problem Relation Age of Onset  . Hypertension Father   . Diabetes Father   . Heart disease  Father   . Kidney disease Father     Died, 61  . Leukemia Brother   . Cancer Brother     myleoblastic anemia  . Hyperlipidemia Mother   . Hypertension Mother     Died, 86  . Thyroid disease Mother     Thyroid surgery  . Pernicious anemia Sister   . Thyroid disease Sister     On thyroid Rx  . Lupus Daughter   . Coronary artery disease Brother   . Hypertension Brother   . Colon cancer Neg Hx   . Esophageal cancer Neg Hx   . Rectal cancer Neg Hx   . Stomach cancer Neg Hx   . Pancreatic cancer Neg Hx     Social History   Social History  . Marital status: Divorced    Spouse name: N/A  . Number of children: 2  . Years of education: N/A   Occupational History  . Nurse/teacher    Social History Main Topics  . Smoking status: Never Smoker  . Smokeless tobacco: Never Used  . Alcohol use No  . Drug use: No  . Sexual activity: Not Currently    Partners: Male   Other Topics Concern  .  Not on file   Social History Narrative   Lives with alone.  She has two daughter.   She works as a Programme researcher, broadcasting/film/video at [employer]--- no    Outpatient Medications Prior to Visit  Medication Sig Dispense Refill  . acetaminophen (TYLENOL) 500 MG tablet Take 1,000 mg by mouth every 6 (six) hours as needed for moderate pain.     Marland Kitchen albuterol (PROAIR HFA) 108 (90 Base) MCG/ACT inhaler USE 2 PUFFS EVERY 4 HOURS AS NEEDED FOR COUGH, WHEEZE OR SHORTNESS OF BREATH. 8.5 each 2  . albuterol (PROVENTIL) (2.5 MG/3ML) 0.083% nebulizer solution Take 3 mLs (2.5 mg total) by nebulization every 6 (six) hours as needed for wheezing or shortness of breath. 75 mL 3  . Blood Glucose Monitoring Suppl (FREESTYLE FREEDOM) KIT 1 Device by Does not apply route daily. 1 each 0  . cholecalciferol (VITAMIN D) 1000 units tablet Take 2,000 Units by mouth daily.    Marland Kitchen CLINPRO 5000 1.1 % PSTE See admin instructions.  4  . dexlansoprazole (DEXILANT) 60 MG capsule Take 1 capsule (60 mg total) by mouth daily. 90 capsule 1   . diclofenac sodium (VOLTAREN) 1 % GEL Apply 2 g topically daily as needed (pain). For pain    . fexofenadine (ALLEGRA) 180 MG tablet Take 1 tablet (180 mg total) by mouth daily. 90 tablet 3  . fluticasone (FLONASE) 50 MCG/ACT nasal spray PLACE 2 SPRAYS INTO BOTH NOSTRILS DAILY. 16 g 4  . Fluticasone-Salmeterol (ADVAIR DISKUS) 250-50 MCG/DOSE AEPB Inhale 1 puff into the lungs 2 (two) times daily. 60 each 2  . FREESTYLE LITE test strip USE TO CHECK BLOOD SUGAR ONCE A DAY 100 each 6  . hyoscyamine (LEVSIN, ANASPAZ) 0.125 MG tablet Take 1 tablet (0.125 mg total) by mouth every 4 (four) hours as needed. 45 tablet 0  . Iron Combinations (I.L.X. B-12) ELIX Take 2 drops by mouth daily.    Marland Kitchen lidocaine (LIDODERM) 5 % APPLY 1 PATCH ON THE SKIN EVERY 12 HRS REMOVE, DISCARD PATCH WITHIN 12 HRS OR AS DIRECTED 30 patch 0  . meclizine (ANTIVERT) 32 MG tablet Take 1 tablet (32 mg total) by mouth 3 (three) times daily as needed for dizziness. 30 tablet 0  . Probiotic Product (ALIGN PO) Take by mouth.    . triamcinolone cream (KENALOG) 0.1 % APPLY TO AFFECTED AREA TWICE A DAY 30 g 0  . valACYclovir (VALTREX) 1000 MG tablet Take 1 tablet (1,000 mg total) by mouth 3 (three) times daily. 30 tablet 5  . ALPRAZolam (XANAX) 0.25 MG tablet Take 1 tablet (0.25 mg total) by mouth 3 (three) times daily as needed. 30 tablet 1  . Calcium Citrate-Vitamin D3 1000-400 LIQD 1 po qd (Patient taking differently: Take 1 tablet by mouth daily. ) 480 mL 5  . levofloxacin (LEVAQUIN) 500 MG tablet Take 1 tablet (500 mg total) by mouth daily. 14 tablet 0  . methimazole (TAPAZOLE) 5 MG tablet Take 1 tablet (5 mg total) by mouth daily. 90 tablet 1   Facility-Administered Medications Prior to Visit  Medication Dose Route Frequency Provider Last Rate Last Dose  . 0.9 %  sodium chloride infusion  500 mL Intravenous Continuous Manus Gunning, MD        Allergies  Allergen Reactions  . Aspirin     REACTION: anaphylaxis  .  Gentamicin     Eye drops turned the sclera bright red  . Ibuprofen     REACTION: anaphylaxsis  .  Nsaids     REACTION: anaphylaxis  . Doxycycline     REACTION: severe nausea/vomiting  . Influenza A (H1n1) Monoval Vac     REACTION: sick for 3 weeks  . Loratadine Other (See Comments)    Fatigue/weakness  . Metronidazole     REACTION: red face/swelling  . Ranitidine Hcl     REACTION: Lips re/peel  . Sulfamethoxazole-Trimethoprim     REACTION: face red/peel  . Tramadol Hcl     REACTION: paranoid    Review of Systems  Constitutional: Positive for chills. Negative for fever.  HENT: Negative for congestion.   Eyes: Negative for blurred vision.  Respiratory: Negative for cough.   Cardiovascular: Negative for chest pain and palpitations.  Gastrointestinal: Negative for vomiting.  Musculoskeletal: Negative for back pain.  Skin: Negative for rash.  Neurological: Positive for headaches. Negative for loss of consciousness.       Objective:    Physical Exam  Constitutional: She is oriented to person, place, and time. She appears well-developed and well-nourished. No distress.  HENT:  Head: Normocephalic and atraumatic.  Eyes: Conjunctivae are normal. Pupils are equal, round, and reactive to light.  Neck: Normal range of motion. No thyromegaly present.  Cardiovascular: Normal rate and regular rhythm.   Pulmonary/Chest: Effort normal and breath sounds normal. She has no wheezes.  Abdominal: Soft. Bowel sounds are normal. There is tenderness. There is guarding.    Musculoskeletal: Normal range of motion. She exhibits no edema or deformity.  Neurological: She is alert and oriented to person, place, and time.  Skin: Skin is warm and dry. She is not diaphoretic.  Psychiatric: She has a normal mood and affect.    There were no vitals taken for this visit. Wt Readings from Last 3 Encounters:  09/28/16 232 lb (105.2 kg)  05/17/16 232 lb (105.2 kg)  05/14/16 232 lb (105.2 kg)      Lab Results  Component Value Date   WBC 7.5 09/28/2016   HGB 13.2 09/28/2016   HCT 39.3 09/28/2016   PLT 263.0 09/28/2016   GLUCOSE 105 (H) 09/28/2016   CHOL 201 (H) 03/23/2016   TRIG 180 (H) 03/23/2016   HDL 54 03/23/2016   LDLDIRECT 139.4 12/26/2011   LDLCALC 111 03/23/2016   ALT 25 09/28/2016   AST 20 09/28/2016   NA 140 09/28/2016   K 4.2 09/28/2016   CL 104 09/28/2016   CREATININE 0.92 09/28/2016   BUN 8 09/28/2016   CO2 31 09/28/2016   TSH 1.06 06/28/2016   HGBA1C 5.9 (H) 12/07/2015    Lab Results  Component Value Date   TSH 1.06 06/28/2016   Lab Results  Component Value Date   WBC 7.5 09/28/2016   HGB 13.2 09/28/2016   HCT 39.3 09/28/2016   MCV 87.4 09/28/2016   PLT 263.0 09/28/2016   Lab Results  Component Value Date   NA 140 09/28/2016   K 4.2 09/28/2016   CO2 31 09/28/2016   GLUCOSE 105 (H) 09/28/2016   BUN 8 09/28/2016   CREATININE 0.92 09/28/2016   BILITOT 0.5 09/28/2016   ALKPHOS 75 09/28/2016   AST 20 09/28/2016   ALT 25 09/28/2016   PROT 7.1 09/28/2016   ALBUMIN 4.1 09/28/2016   CALCIUM 9.5 09/28/2016   ANIONGAP 11 12/08/2015   GFR 81.01 09/28/2016   Lab Results  Component Value Date   CHOL 201 (H) 03/23/2016   Lab Results  Component Value Date   HDL 54 03/23/2016   Lab Results  Component Value Date   LDLCALC 111 03/23/2016   Lab Results  Component Value Date   TRIG 180 (H) 03/23/2016   Lab Results  Component Value Date   CHOLHDL 3.7 03/23/2016   Lab Results  Component Value Date   HGBA1C 5.9 (H) 12/07/2015       Assessment & Plan:   Problem List Items Addressed This Visit      Unprioritized   Essential hypertension   Acute diverticulitis    Hx of-- pt feels like this feels the same  Start abx and check CT      Relevant Orders   Comprehensive metabolic panel (Completed)   CBC (Completed)   Lower abdominal pain - Primary    Check ct--- r/o diverticulitis Start Augmentin To ER if pain worsens        Relevant Orders   POCT Urinalysis Dipstick (Automated) (Completed)   Comprehensive metabolic panel (Completed)   CBC (Completed)   CT Abdomen Pelvis W Contrast (Completed)      I am having Ms. Bower start on amoxicillin-clavulanate. I am also having her maintain her acetaminophen, fexofenadine, FREESTYLE FREEDOM, cholecalciferol, albuterol, diclofenac sodium, meclizine, I.L.X. B-12, Fluticasone-Salmeterol, FREESTYLE LITE, valACYclovir, Probiotic Product (ALIGN PO), hyoscyamine, CLINPRO 5000, dexlansoprazole, lidocaine, albuterol, fluticasone, triamcinolone cream, and ALPRAZolam. We will continue to administer sodium chloride.  Meds ordered this encounter  Medications  . amoxicillin-clavulanate (AUGMENTIN) 875-125 MG tablet    Sig: Take 1 tablet by mouth 2 (two) times daily.    Dispense:  20 tablet    Refill:  0  . ALPRAZolam (XANAX) 0.25 MG tablet    Sig: Take 1 tablet (0.25 mg total) by mouth 3 (three) times daily as needed.    Dispense:  30 tablet    Refill:  0    CMA served as scribe during this visit. History, Physical and Plan performed by medical provider. Documentation and orders reviewed and attested to.  [name], DO Patient ID: Emily Osborne, female   DOB: 1961/06/02, 56 y.o.   MRN: [id number]

## 2016-09-28 NOTE — Telephone Encounter (Signed)
Left patient a message, asking her to call back with name of medication they gave her. Told her that I would let Dr. Havery Moros know and he can take a look at the CT scan. As far as the H. Pylori stool test that most likely will have to wait. Patient called back, she was given Augmentin 875 mg BID x 10 days. Let her know that if she does not feel better to call, always a doctor on call. I also cautioned patient that this antibiotic can be hard on her stomach, make sure she follows directions on how to take.

## 2016-09-29 DIAGNOSIS — R1030 Lower abdominal pain, unspecified: Secondary | ICD-10-CM | POA: Insufficient documentation

## 2016-09-29 NOTE — Assessment & Plan Note (Signed)
Check ct--- r/o diverticulitis Start Augmentin To ER if pain worsens

## 2016-09-29 NOTE — Assessment & Plan Note (Signed)
Hx of-- pt feels like this feels the same  Start abx and check CT

## 2016-10-01 ENCOUNTER — Other Ambulatory Visit: Payer: Self-pay | Admitting: Cardiovascular Disease

## 2016-10-01 DIAGNOSIS — R0789 Other chest pain: Secondary | ICD-10-CM

## 2016-10-02 ENCOUNTER — Telehealth: Payer: Self-pay

## 2016-10-02 NOTE — Telephone Encounter (Signed)
Can patient restart her Dexilant while on antibiotic? Then stay off of it 2 weeks prior to submitting stool sample?

## 2016-10-02 NOTE — Telephone Encounter (Signed)
I would recommend she complete the course of antibiotics given to her for diverticulitis and hold off on H pylori stool test for now. She can wait a few weeks after she has completed antibiotics to submit the stool test. She has had prior colonoscopy last year showing pancolonic diverticulitis. Thanks

## 2016-10-02 NOTE — Telephone Encounter (Signed)
Yes that's fine thanks for clarifying

## 2016-10-03 ENCOUNTER — Ambulatory Visit (HOSPITAL_COMMUNITY)
Admission: RE | Admit: 2016-10-03 | Discharge: 2016-10-03 | Disposition: A | Discharge status: Home / Self Care | Payer: BC Managed Care – PPO | Source: Ambulatory Visit | Attending: Cardiovascular Disease | Admitting: Cardiovascular Disease

## 2016-10-03 ENCOUNTER — Encounter (HOSPITAL_COMMUNITY)
Admission: RE | Disposition: A | Discharge status: Home / Self Care | Payer: Self-pay | Source: Ambulatory Visit | Attending: Cardiovascular Disease

## 2016-10-03 DIAGNOSIS — Z8249 Family history of ischemic heart disease and other diseases of the circulatory system: Secondary | ICD-10-CM | POA: Insufficient documentation

## 2016-10-03 DIAGNOSIS — Z882 Allergy status to sulfonamides status: Secondary | ICD-10-CM | POA: Insufficient documentation

## 2016-10-03 DIAGNOSIS — R1013 Epigastric pain: Secondary | ICD-10-CM | POA: Insufficient documentation

## 2016-10-03 DIAGNOSIS — M797 Fibromyalgia: Secondary | ICD-10-CM | POA: Diagnosis not present

## 2016-10-03 DIAGNOSIS — R072 Precordial pain: Secondary | ICD-10-CM | POA: Insufficient documentation

## 2016-10-03 DIAGNOSIS — E785 Hyperlipidemia, unspecified: Secondary | ICD-10-CM | POA: Insufficient documentation

## 2016-10-03 DIAGNOSIS — R030 Elevated blood-pressure reading, without diagnosis of hypertension: Secondary | ICD-10-CM | POA: Insufficient documentation

## 2016-10-03 DIAGNOSIS — K219 Gastro-esophageal reflux disease without esophagitis: Secondary | ICD-10-CM | POA: Diagnosis not present

## 2016-10-03 DIAGNOSIS — K589 Irritable bowel syndrome without diarrhea: Secondary | ICD-10-CM | POA: Insufficient documentation

## 2016-10-03 DIAGNOSIS — E0590 Thyrotoxicosis, unspecified without thyrotoxic crisis or storm: Secondary | ICD-10-CM | POA: Diagnosis not present

## 2016-10-03 DIAGNOSIS — R0789 Other chest pain: Secondary | ICD-10-CM

## 2016-10-03 DIAGNOSIS — Z833 Family history of diabetes mellitus: Secondary | ICD-10-CM | POA: Insufficient documentation

## 2016-10-03 DIAGNOSIS — Z7951 Long term (current) use of inhaled steroids: Secondary | ICD-10-CM | POA: Diagnosis not present

## 2016-10-03 DIAGNOSIS — J45909 Unspecified asthma, uncomplicated: Secondary | ICD-10-CM | POA: Insufficient documentation

## 2016-10-03 HISTORY — PX: LEFT HEART CATH AND CORONARY ANGIOGRAPHY: CATH118249

## 2016-10-03 LAB — BASIC METABOLIC PANEL
ANION GAP: 9 (ref 5–15)
BUN: 10 mg/dL (ref 6–20)
CALCIUM: 9.4 mg/dL (ref 8.9–10.3)
CO2: 26 mmol/L (ref 22–32)
Chloride: 106 mmol/L (ref 101–111)
Creatinine, Ser: 0.86 mg/dL (ref 0.44–1.00)
GLUCOSE: 91 mg/dL (ref 65–99)
Potassium: 4.2 mmol/L (ref 3.5–5.1)
Sodium: 141 mmol/L (ref 135–145)

## 2016-10-03 LAB — PROTIME-INR
INR: 0.93
PROTHROMBIN TIME: 12.5 s (ref 11.4–15.2)

## 2016-10-03 SURGERY — LEFT HEART CATH AND CORONARY ANGIOGRAPHY
Anesthesia: LOCAL

## 2016-10-03 MED ORDER — VERAPAMIL HCL 2.5 MG/ML IV SOLN
INTRAVENOUS | Status: AC
  Filled 2016-10-03: qty 2

## 2016-10-03 MED ORDER — SODIUM CHLORIDE 0.9 % IV SOLN: 250.0000 mL | INTRAVENOUS | Status: DC | PRN

## 2016-10-03 MED ORDER — FENTANYL CITRATE (PF) 100 MCG/2ML IJ SOLN
INTRAMUSCULAR | Status: DC | PRN
  Administered 2016-10-03: 50 ug via INTRAVENOUS

## 2016-10-03 MED ORDER — SODIUM CHLORIDE 0.9 % WEIGHT BASED INFUSION
3.0000 mL/kg/h | INTRAVENOUS | Status: DC
  Administered 2016-10-03: 3 mL/kg/h via INTRAVENOUS

## 2016-10-03 MED ORDER — LIDOCAINE HCL (PF) 1 % IJ SOLN
INTRAMUSCULAR | Status: AC
  Filled 2016-10-03: qty 30

## 2016-10-03 MED ORDER — MIDAZOLAM HCL 2 MG/2ML IJ SOLN
INTRAMUSCULAR | Status: DC | PRN
  Administered 2016-10-03: 2 mg via INTRAVENOUS

## 2016-10-03 MED ORDER — SODIUM CHLORIDE 0.9% FLUSH: 3.0000 mL | INTRAVENOUS | Status: DC | PRN

## 2016-10-03 MED ORDER — SODIUM CHLORIDE 0.9% FLUSH: 3.0000 mL | Freq: Two times a day (BID) | INTRAVENOUS | Status: DC

## 2016-10-03 MED ORDER — FENTANYL CITRATE (PF) 100 MCG/2ML IJ SOLN
INTRAMUSCULAR | Status: AC
  Filled 2016-10-03: qty 2

## 2016-10-03 MED ORDER — SODIUM CHLORIDE 0.9 % IV SOLN: INTRAVENOUS | Status: AC

## 2016-10-03 MED ORDER — HEPARIN SODIUM (PORCINE) 1000 UNIT/ML IJ SOLN
INTRAMUSCULAR | Status: AC
  Filled 2016-10-03: qty 1

## 2016-10-03 MED ORDER — VERAPAMIL HCL 2.5 MG/ML IV SOLN
INTRAVENOUS | Status: DC | PRN
  Administered 2016-10-03: 10 mL via INTRA_ARTERIAL

## 2016-10-03 MED ORDER — HEPARIN SODIUM (PORCINE) 1000 UNIT/ML IJ SOLN
INTRAMUSCULAR | Status: DC | PRN
  Administered 2016-10-03: 5000 [IU] via INTRAVENOUS

## 2016-10-03 MED ORDER — HEPARIN (PORCINE) IN NACL 2-0.9 UNIT/ML-% IJ SOLN
INTRAMUSCULAR | Status: DC | PRN
  Administered 2016-10-03: 1000 mL

## 2016-10-03 MED ORDER — MIDAZOLAM HCL 2 MG/2ML IJ SOLN
INTRAMUSCULAR | Status: AC
  Filled 2016-10-03: qty 2

## 2016-10-03 MED ORDER — SODIUM CHLORIDE 0.9 % WEIGHT BASED INFUSION: 1.0000 mL/kg/h | INTRAVENOUS | Status: DC

## 2016-10-03 MED ORDER — HEPARIN (PORCINE) IN NACL 2-0.9 UNIT/ML-% IJ SOLN
INTRAMUSCULAR | Status: AC
  Filled 2016-10-03: qty 1000

## 2016-10-03 MED ORDER — LIDOCAINE HCL (PF) 1 % IJ SOLN
INTRAMUSCULAR | Status: DC | PRN
  Administered 2016-10-03: 3 mL

## 2016-10-03 SURGICAL SUPPLY — 12 items
CATH INFINITI 5 FR JL3.5 (CATHETERS) ×2 IMPLANT
CATH INFINITI 5FR ANG PIGTAIL (CATHETERS) ×2 IMPLANT
CATH INFINITI JR4 5F (CATHETERS) ×2 IMPLANT
DEVICE RAD COMP TR BAND LRG (VASCULAR PRODUCTS) ×2 IMPLANT
GLIDESHEATH SLEND SS 6F .021 (SHEATH) ×2 IMPLANT
GUIDEWIRE INQWIRE 1.5J.035X260 (WIRE) ×1 IMPLANT
INQWIRE 1.5J .035X260CM (WIRE) ×2
KIT HEART LEFT (KITS) ×2 IMPLANT
PACK CARDIAC CATHETERIZATION (CUSTOM PROCEDURE TRAY) ×2 IMPLANT
SYR MEDRAD MARK V 150ML (SYRINGE) ×2 IMPLANT
TRANSDUCER W/STOPCOCK (MISCELLANEOUS) ×2 IMPLANT
TUBING CIL FLEX 10 FLL-RA (TUBING) ×2 IMPLANT

## 2016-10-03 NOTE — Telephone Encounter (Signed)
Vitals entered. LB

## 2016-10-03 NOTE — Telephone Encounter (Signed)
Spoke to patient, let her know she may resume the [place] if she wishes. She stated that on the Augmentin packaging it cautioned against taking this type of medication. She will finish the antibiotic, wait two weeks then submit stool sample for the H. Pylori test.

## 2016-10-03 NOTE — H&P (View-Only) (Signed)
Cardiology Office Note   Date:  09/28/2016   ID:  [name], [name] 07/03/1961, MRN [id number]  PCP:  [name], DO  Cardiologist:   Jenkins Rouge, MD   Chief Complaint  Patient presents with  . Routine OV  . Chest Pain      History of Present Illness: Emily Osborne is a 56 y.o. female who presents for chest pain. Has not bee seen since 2015 Saw Dr Aundra Dubin in 2009 with normal ETT  F/U ETT also normal 2015 History of GERD/H pylori  now on Dexilant CRF;s borderline HTN and elevated lipids   CT 11/2015 Diverticulitis  Colon: multiple polyps  EGD: 05/17/16 benign some gastric polyps   She is very concerned about her pain Center chest and clearly improved with atenolol And lower HR. Pain tends to be exertional She cannot have further GI w/u until cleared Rolaids did not help pain   Sees Dr Havery Moros for GI now  Past Medical History:  Diagnosis Date  . Allergy   . Asthma    pt has inhaler  . Cataract    bil eyes  . Edema   . Elevated blood pressure reading without diagnosis of hypertension    "just elevated when I'm in pain" (12/07/2015)  . Fibromyalgia   . Ganglion of joint    right wrist  . GERD (gastroesophageal reflux disease)   . Herpes zoster without mention of complication   . Hyperlipidemia   . IBS (irritable bowel syndrome)   . Nontraumatic rupture of Achilles tendon   . Other acute reactions to stress   . Pain in joint, shoulder region   . Pain in joint, upper arm   . Pain in limb   . Personal history of other diseases of digestive system   . Thyroid disease    hyperthyroidism    Past Surgical History:  Procedure Laterality Date  . ABDOMINAL HYSTERECTOMY    . ACHILLES TENDON REPAIR Right 2005  . BUNIONECTOMY Bilateral    Bunionectomy 1983  . GANGLION CYST EXCISION Right    wrist  . MENISCUS REPAIR Right 04/2014  . TUBAL LIGATION    . wisdomteeth extraction       Current Outpatient Prescriptions  Medication Sig Dispense Refill  .  acetaminophen (TYLENOL) 500 MG tablet Take 1,000 mg by mouth every 6 (six) hours as needed for moderate pain.     Marland Kitchen albuterol (PROAIR HFA) 108 (90 Base) MCG/ACT inhaler USE 2 PUFFS EVERY 4 HOURS AS NEEDED FOR COUGH, WHEEZE OR SHORTNESS OF BREATH. 8.5 each 2  . albuterol (PROVENTIL) (2.5 MG/3ML) 0.083% nebulizer solution Take 3 mLs (2.5 mg total) by nebulization every 6 (six) hours as needed for wheezing or shortness of breath. 75 mL 3  . ALPRAZolam (XANAX) 0.25 MG tablet Take 1 tablet (0.25 mg total) by mouth 3 (three) times daily as needed. 30 tablet 0  . amoxicillin-clavulanate (AUGMENTIN) 875-125 MG tablet Take 1 tablet by mouth 2 (two) times daily. 20 tablet 0  . Blood Glucose Monitoring Suppl (FREESTYLE FREEDOM) KIT 1 Device by Does not apply route daily. 1 each 0  . Calcium Carb-Cholecalciferol (CALCIUM 1000 + D PO) Take 1 tablet by mouth daily.    . cholecalciferol (VITAMIN D) 1000 units tablet Take 2,000 Units by mouth daily.    Marland Kitchen CLINPRO 5000 1.1 % PSTE See admin instructions.  4  . dexlansoprazole (DEXILANT) 60 MG capsule Take 1 capsule (60 mg total) by mouth daily.  90 capsule 1  . diclofenac sodium (VOLTAREN) 1 % GEL Apply 2 g topically daily as needed (pain). For pain    . fexofenadine (ALLEGRA) 180 MG tablet Take 1 tablet (180 mg total) by mouth daily. 90 tablet 3  . fluticasone (FLONASE) 50 MCG/ACT nasal spray PLACE 2 SPRAYS INTO BOTH NOSTRILS DAILY. 16 g 4  . Fluticasone-Salmeterol (ADVAIR DISKUS) 250-50 MCG/DOSE AEPB Inhale 1 puff into the lungs 2 (two) times daily. 60 each 2  . FREESTYLE LITE test strip USE TO CHECK BLOOD SUGAR ONCE A DAY 100 each 6  . hyoscyamine (LEVSIN, ANASPAZ) 0.125 MG tablet Take 1 tablet (0.125 mg total) by mouth every 4 (four) hours as needed. 45 tablet 0  . Iron Combinations (I.L.X. B-12) ELIX Take 2 drops by mouth daily.    Marland Kitchen lidocaine (LIDODERM) 5 % APPLY 1 PATCH ON THE SKIN EVERY 12 HRS REMOVE, DISCARD PATCH WITHIN 12 HRS OR AS DIRECTED 30 patch 0  .  meclizine (ANTIVERT) 32 MG tablet Take 1 tablet (32 mg total) by mouth 3 (three) times daily as needed for dizziness. 30 tablet 0  . methimazole (TAPAZOLE) 5 MG tablet Take 2.5 mg by mouth daily.  1  . Probiotic Product (ALIGN PO) Take by mouth.    . triamcinolone cream (KENALOG) 0.1 % APPLY TO AFFECTED AREA TWICE A DAY 30 g 0  . valACYclovir (VALTREX) 1000 MG tablet Take 1 tablet (1,000 mg total) by mouth 3 (three) times daily. 30 tablet 5   Current Facility-Administered Medications  Medication Dose Route Frequency Provider Last Rate Last Dose  . 0.9 %  sodium chloride infusion  500 mL Intravenous Continuous Manus Gunning, MD        Allergies:   Aspirin; Gentamicin; Ibuprofen; Nsaids; Doxycycline; Influenza a (h1n1) monoval vac; Loratadine; Metronidazole; Ranitidine hcl; Sulfamethoxazole-trimethoprim; and Tramadol hcl    Social History:  The patient  reports that she has never smoked. She has never used smokeless tobacco. She reports that she does not drink alcohol or use drugs.   Family History:  The patient's family history includes Cancer in her brother; Coronary artery disease in her brother; Diabetes in her father; Heart disease in her father; Hyperlipidemia in her mother; Hypertension in her brother, father, and mother; Kidney disease in her father; Leukemia in her brother; Lupus in her daughter; Pernicious anemia in her sister; Thyroid disease in her mother and sister.    ROS:  Please see the history of present illness.   Otherwise, review of systems are positive for none.   All other systems are reviewed and negative.    PHYSICAL EXAM: VS:  BP 130/80   Pulse 77   Ht _0  (1.651 m)   Wt 232 lb (105.2 kg)   SpO2 98%   BMI 38.61 kg/m  , BMI Body mass index is 38.61 kg/m. Affect appropriate Healthy:  appears stated age 37: normal Neck supple with no adenopathy JVP normal no bruits no thyromegaly Lungs clear with no wheezing and good diaphragmatic motion Heart:   S1/S2 no murmur, no rub, gallop or click PMI normal Abdomen: benighn, BS positve, no tenderness, no AAA no bruit.  No HSM or HJR Distal pulses intact with no bruits No edema Neuro non-focal Skin warm and dry No muscular weakness    EKG:  12/07/15 SR normal ECG   09/28/16 SR rate 54 normal    Recent Labs: 02/06/2016: Magnesium 2.0 06/28/2016: TSH 1.06 09/28/2016: ALT 25; BUN 8; Creatinine, Ser 0.92; Hemoglobin  13.2; Platelets 263.0; Potassium 4.2; Sodium 140    Lipid Panel    Component Value Date/Time   CHOL 201 (H) 03/23/2016 1501   TRIG 180 (H) 03/23/2016 1501   HDL 54 03/23/2016 1501   CHOLHDL 3.7 03/23/2016 1501   VLDL 36 (H) 03/23/2016 1501   LDLCALC 111 03/23/2016 1501   LDLDIRECT 139.4 12/26/2011 0905      Wt Readings from Last 3 Encounters:  09/28/16 232 lb (105.2 kg)  05/17/16 232 lb (105.2 kg)  05/14/16 232 lb (105.2 kg)      Other studies Reviewed: Additional studies/ records that were reviewed today include: Notes from primary Previous ETT 2009 and 2015 old cardiology notes Notes and EGD GI Dr Havery Moros.    ASSESSMENT AND PLAN:  1.  Chest Pain concern for angina previously negative w/u No help with GI Rx And improved with atenolol Discussed options and favor cath. Risks including Stroke , MI, bleeding contrast reaction and surgery discussed. Favor dye load While still on tapazole so thyroid will not be effected.  Lab called orders written Labs done by primary this am should suffice  2. Dyspepsia continue dexilent f/u [facility] post cath for further w/u and EGD  3. Asthma no active wheezing continue inhalers 4. Chol  Lab Results  Component Value Date   LDLCALC 111 03/23/2016   5. Thyroid:  Being Rx for hyperthyroidism Continue tapazole do not stop until after contrast load for cath and abdominal CT TSH and f/u with Dr Renne Crigler continue atenolol    Current medicines are reviewed at length with the patient today.  The patient does not have concerns  regarding medicines.  The following changes have been made:  no change  Labs/ tests ordered today include: Notes primary Dr Aundra Dubin labs ETT and ECG;s   Orders Placed This Encounter  Procedures  . EKG 12-Lead     Disposition:   FU with me post cath      Signed, Jenkins Rouge, MD  09/28/2016 12:28 PM    [facility] [place], [place], [place]  [address] Phone: [phone]; Fax: [phone]

## 2016-10-03 NOTE — Discharge Instructions (Signed)

## 2016-10-03 NOTE — Interval H&P Note (Signed)
History and Physical Interval Note:  10/03/2016 11:39 AM  Emily Osborne  has presented today for cardiac cath with the diagnosis of chest pain. The various methods of treatment have been discussed with the patient and family. After consideration of risks, benefits and other options for treatment, the patient has consented to  Procedure(s): Left Heart Cath and Coronary Angiography (N/A) as a surgical intervention .  The patient's history has been reviewed, patient examined, no change in status, stable for surgery.  I have reviewed the patient's chart and labs.  Questions were answered to the patient's satisfaction.    Cath Lab Visit (complete for each Cath Lab visit)  Clinical Evaluation Leading to the Procedure:   ACS: No.  Non-ACS:    Anginal Classification: CCS II  Anti-ischemic medical therapy: Minimal Therapy (1 class of medications)  Non-Invasive Test Results: No non-invasive testing performed  Prior CABG: No previous CABG         Lauree Chandler

## 2016-10-04 ENCOUNTER — Encounter (HOSPITAL_COMMUNITY): Payer: Self-pay | Admitting: Cardiovascular Disease

## 2016-10-05 ENCOUNTER — Ambulatory Visit (INDEPENDENT_AMBULATORY_CARE_PROVIDER_SITE_OTHER): Payer: BC Managed Care – PPO | Admitting: Internal Medicine

## 2016-10-05 ENCOUNTER — Encounter: Payer: Self-pay | Admitting: Internal Medicine

## 2016-10-05 VITALS — BP 122/78 | HR 72 | Wt 235.0 lb

## 2016-10-05 DIAGNOSIS — E0500 Thyrotoxicosis with diffuse goiter without thyrotoxic crisis or storm: Secondary | ICD-10-CM | POA: Diagnosis not present

## 2016-10-05 LAB — TSH: TSH: 1.11 u[IU]/mL (ref 0.35–4.50)

## 2016-10-05 LAB — T3, FREE: T3, Free: 3.2 pg/mL (ref 2.3–4.2)

## 2016-10-05 LAB — T4, FREE: Free T4: 0.78 ng/dL (ref 0.60–1.60)

## 2016-10-05 NOTE — Patient Instructions (Addendum)
Please continue Atenolol 25 mg daily. You may need to increase to 2 tablets daily if your pulse at rest >90.  Please continue Methimazole 2.5 mg daily for now but we may need to increase the dose.  Please return in 4 months.

## 2016-10-05 NOTE — Progress Notes (Addendum)
Patient ID: Emily Osborne, female   DOB: August 26, 1960, 56 y.o.   MRN: [id number]    HPI  Emily Osborne is a 56 y.o.-year-old female, returning for f/u for Graves ds. Last visit 5 mo ago.  She had a new diverticulitis episode >> new CT with contrast 09/28/2016.  She also had CP >> new cardiac cath 10/03/2016 >> no pathology.  I reviewed pt's thyroid tests: Lab Results  Component Value Date   TSH 1.06 06/28/2016   TSH 1.30 04/27/2016   TSH 0.90 03/23/2016   TSH 0.01 (L) 12/27/2015   TSH 0.01 (L) 12/14/2015   TSH 0.021 (L) 12/07/2015   TSH 0.11 (L) 10/31/2015   TSH 0.87 09/07/2014   TSH 0.29 (L) 10/28/2013   TSH 0.32 (L) 09/02/2013   FREET4 0.74 06/28/2016   FREET4 0.76 04/27/2016   FREET4 1.61 (H) 12/27/2015   FREET4 1.34 (H) 12/08/2015   FREET4 1.18 11/02/2015   FREET4 0.90 10/28/2013   FREET4 0.8 12/27/2009    Component     Latest Ref Rng 12/27/2015  TSI     <140 % baseline 393 (H)   We started MMI 5 mg daily >> subsequent TFTs have normalized >> now on MMI 2.5 mg daily.  We also started Atenolol 25 mg 2x a day >> she came off >> restarted on 09/14/2016 as she had intermittent CP >> CP resolved >> now continues on this as advised by cardiology >> on 25 mg Atenolol now.  Pt denies feeling nodules in neck, hoarseness, dysphagia/odynophagia, SOB with lying down; she c/o: - + fatigue - + weight gain - + cold intolerance - no tremors - no anxiety - no palpitations - no hyperdefecation - no hair loss  Pt does not have a FH of thyroid ds. (mother, sister). No FH of thyroid cancer. No h/o radiation tx to head or neck.  No seaweed or kelp, + recent contrast studies (CT scan and catheterization). No steroid use. No herbal supplements. No Biotin use.   She does have a history of B12 deficiency.  ROS: Constitutional: + HPI Eyes: no blurry vision, no xerophthalmia ENT: no sore throat, no nodules palpated in throat, no dysphagia/odynophagia, no hoarseness Cardiovascular: +  CP/no palpitations/no leg swelling Respiratory: no cough/+ SOB Gastrointestinal:  no N/V/D/C/Acid reflux Musculoskeletal: no muscle/joint aches Skin: no rashes Neurological: notremors/no numbness/tingling/dizziness  I reviewed pt's medications, allergies, PMH, social hx, family hx, and changes were documented in the history of present illness. Otherwise, unchanged from my initial visit note.  Past Medical History:  Diagnosis Date  . Allergy   . Asthma    pt has inhaler  . Cataract    bil eyes  . Edema   . Elevated blood pressure reading without diagnosis of hypertension    "just elevated when I'm in pain" (12/07/2015)  . Fibromyalgia   . Ganglion of joint    right wrist  . GERD (gastroesophageal reflux disease)   . Herpes zoster without mention of complication   . Hyperlipidemia   . IBS (irritable bowel syndrome)   . Nontraumatic rupture of Achilles tendon   . Other acute reactions to stress   . Pain in joint, shoulder region   . Pain in joint, upper arm   . Pain in limb   . Personal history of other diseases of digestive system   . Thyroid disease    hyperthyroidism   Past Surgical History:  Procedure Laterality Date  . ABDOMINAL HYSTERECTOMY    . ACHILLES  TENDON REPAIR Right 2005  . BUNIONECTOMY Bilateral    Bunionectomy 1983  . GANGLION CYST EXCISION Right    wrist  . LEFT HEART CATH AND CORONARY ANGIOGRAPHY N/A 10/03/2016   Procedure: Left Heart Cath and Coronary Angiography;  Surgeon: Burnell Blanks, MD;  Location: [facility];  Service: Cardiovascular;  Laterality: N/A;  . MENISCUS REPAIR Right 04/2014  . TUBAL LIGATION    . wisdomteeth extraction     Social History   Social History  . Marital status: Divorced    Spouse name: N/A  . Number of children: 2  . Years of education: N/A   Occupational History  . Nurse/teacher    Social History Main Topics  . Smoking status: Never Smoker  . Smokeless tobacco: Never Used  . Alcohol use No  .  Drug use: No  . Sexual activity: Not Currently    Partners: Male   Other Topics Concern  . Not on file   Social History Narrative   Lives with alone.  She has two daughter.   She works as a Programme researcher, broadcasting/film/video at [employer]--- no   Current Outpatient Prescriptions on File Prior to Visit  Medication Sig Dispense Refill  . acetaminophen (TYLENOL) 500 MG tablet Take 1,000 mg by mouth every 6 (six) hours as needed for moderate pain.     Marland Kitchen albuterol (PROAIR HFA) 108 (90 Base) MCG/ACT inhaler USE 2 PUFFS EVERY 4 HOURS AS NEEDED FOR COUGH, WHEEZE OR SHORTNESS OF BREATH. 8.5 each 2  . albuterol (PROVENTIL) (2.5 MG/3ML) 0.083% nebulizer solution Take 3 mLs (2.5 mg total) by nebulization every 6 (six) hours as needed for wheezing or shortness of breath. 75 mL 3  . ALPRAZolam (XANAX) 0.25 MG tablet Take 1 tablet (0.25 mg total) by mouth 3 (three) times daily as needed. (Patient taking differently: Take 0.25 mg by mouth 2 (two) times daily as needed for anxiety or sleep. ) 30 tablet 0  . amoxicillin-clavulanate (AUGMENTIN) 875-125 MG tablet Take 1 tablet by mouth 2 (two) times daily. 20 tablet 0  . atenolol (TENORMIN) 25 MG tablet Take 25 mg by mouth daily.    . Blood Glucose Monitoring Suppl (FREESTYLE FREEDOM) KIT 1 Device by Does not apply route daily. 1 each 0  . Calcium Carb-Cholecalciferol (CALCIUM 1000 + D PO) Take 1 tablet by mouth daily.    . cholecalciferol (VITAMIN D) 1000 units tablet Take 1,000 Units by mouth daily.     Marland Kitchen CLINPRO 5000 1.1 % PSTE See admin instructions.  4  . dexlansoprazole (DEXILANT) 60 MG capsule Take 1 capsule (60 mg total) by mouth daily. 90 capsule 1  . diclofenac sodium (VOLTAREN) 1 % GEL Apply 2 g topically 2 (two) times daily as needed (pain). For pain    . fexofenadine (ALLEGRA) 180 MG tablet Take 1 tablet (180 mg total) by mouth daily. 90 tablet 3  . fluticasone (FLONASE) 50 MCG/ACT nasal spray PLACE 2 SPRAYS INTO BOTH NOSTRILS DAILY. 16 g 4  .  Fluticasone-Salmeterol (ADVAIR DISKUS) 250-50 MCG/DOSE AEPB Inhale 1 puff into the lungs 2 (two) times daily. (Patient taking differently: Inhale 1 puff into the lungs daily. ) 60 each 2  . FREESTYLE LITE test strip USE TO CHECK BLOOD SUGAR ONCE A DAY 100 each 6  . Iron Combinations (I.L.X. B-12) ELIX Take 2 drops by mouth daily as needed (energy).     Marland Kitchen lidocaine (LIDODERM) 5 % APPLY 1 PATCH ON THE SKIN EVERY 12  HRS REMOVE, DISCARD PATCH WITHIN 12 HRS OR AS DIRECTED (Patient taking differently: APPLY 1 PATCH ON THE SKIN EVERY 12 HRS REMOVE, DISCARD PATCH WITHIN 12 HRS AS NEEDED FOR PAIN) 30 patch 0  . meclizine (ANTIVERT) 32 MG tablet Take 1 tablet (32 mg total) by mouth 3 (three) times daily as needed for dizziness. 30 tablet 0  . methimazole (TAPAZOLE) 5 MG tablet Take 2.5 mg by mouth daily.  1  . triamcinolone cream (KENALOG) 0.1 % APPLY TO AFFECTED AREA TWICE A DAY (Patient taking differently: APPLY TO AFFECTED AREA TWICE A DAY AS NEEDED FOR RASH) 30 g 0  . valACYclovir (VALTREX) 1000 MG tablet Take 1 tablet (1,000 mg total) by mouth 3 (three) times daily. (Patient taking differently: Take 1,000 mg by mouth. Take 2g twice a day at first sign of fever blister, then take 1g twice daily until symptoms are gone) 30 tablet 5  . hyoscyamine (LEVSIN, ANASPAZ) 0.125 MG tablet Take 1 tablet (0.125 mg total) by mouth every 4 (four) hours as needed. (Patient not taking: Reported on 10/05/2016) 45 tablet 0   Current Facility-Administered Medications on File Prior to Visit  Medication Dose Route Frequency Provider Last Rate Last Dose  . 0.9 %  sodium chloride infusion  500 mL Intravenous Continuous Manus Gunning, MD       Allergies  Allergen Reactions  . Aspirin     REACTION: anaphylaxis  . Gentamicin     Eye drops turned the sclera bright red  . Ibuprofen     REACTION: anaphylaxsis  . Nsaids     REACTION: anaphylaxis  . Doxycycline     REACTION: severe nausea/vomiting  . Influenza A (H1n1)  Monoval Vac     REACTION: sick for 3 weeks  . Loratadine Other (See Comments)    Fatigue/weakness  . Metronidazole     REACTION: red face/swelling  . Ranitidine Hcl     REACTION: Lips turned red /peel  . Sulfamethoxazole-Trimethoprim     REACTION: face red/peel  . Tramadol Hcl     REACTION: paranoid   Family History  Problem Relation Age of Onset  . Hypertension Father   . Diabetes Father   . Heart disease Father   . Kidney disease Father     Died, 86  . Leukemia Brother   . Cancer Brother     myleoblastic anemia  . Hyperlipidemia Mother   . Hypertension Mother     Died, 12  . Thyroid disease Mother     Thyroid surgery  . Pernicious anemia Sister   . Thyroid disease Sister     On thyroid Rx  . Lupus Daughter   . Coronary artery disease Brother   . Hypertension Brother   . Colon cancer Neg Hx   . Esophageal cancer Neg Hx   . Rectal cancer Neg Hx   . Stomach cancer Neg Hx   . Pancreatic cancer Neg Hx    PE: BP 122/78 (BP Location: Left Arm, Patient Position: Sitting)   Pulse 72   Wt 235 lb (106.6 kg)   SpO2 96%   BMI 39.11 kg/m  Wt Readings from Last 3 Encounters:  10/05/16 235 lb (106.6 kg)  10/03/16 232 lb (105.2 kg)  09/28/16 232 lb (105.2 kg)   Constitutional: overweight, in NAD Eyes: PERRLA, EOMI, no exophthalmos ENT: moist mucous membranes, no thyromegaly, no cervical lymphadenopathy Cardiovascular: RRR, No MRG Respiratory: CTA B Gastrointestinal: abdomen soft, NT, ND, BS+ Musculoskeletal: no deformities, strength intact in  all 4 Skin: moist, warm, no rashes Neurological:  no tremor with outstretched hands, DTR normal in all 4  ASSESSMENT: 1. Graves ds.  PLAN:  1. Patient with Graves ds., initially with few possible thyrotoxic sxs: weight loss, some heat intolerance, tremors, palpitations, then with significant improvement in sxs and TFTs on MMI low dose. She has been feeling very well on methimazole 2.5 mg daily until she developed another  diverticulitis attacks for which she had to have a contrasted CT scan. After this, she started to develop chest pain and ended up having a heart catheterization. This was clean, however both procedures exposed her to a significant amount of iodine and she is now feeling "an internal tremor" reminding her of the previous hyperthyroid symptoms. She did restart her atenolol, which I advised her to continue. She can also increase the dose to twice a day if needed. - will check the TSH, fT3 and fT4 today to see if we need to increase MMI  - From now on, we probably need to be proactive if she is to have more iodinated contrast, with atenolol and possibly even a higher dose of methimazole - RTC in 4 mo  Component     Latest Ref Rng & Units 10/05/2016  TSH     0.35 - 4.50 uIU/mL 1.11  Triiodothyronine,Free,Serum     2.3 - 4.2 pg/mL 3.2  T4,Free(Direct)     0.60 - 1.60 ng/dL 0.78  Thyroid tests are normal. For now, will continue with atenolol, but would not change the methimazole dose.  Philemon Kingdom, MD PhD [facility] Endocrinology

## 2016-10-07 ENCOUNTER — Encounter: Payer: Self-pay | Admitting: Gastroenterology

## 2016-10-07 ENCOUNTER — Encounter: Payer: Self-pay | Admitting: Internal Medicine

## 2016-10-08 ENCOUNTER — Other Ambulatory Visit: Payer: Self-pay

## 2016-10-08 MED ORDER — METHIMAZOLE 5 MG PO TABS: 2.5000 mg | ORAL_TABLET | Freq: Every day | ORAL | 0 refills | Status: DC

## 2016-10-08 MED ORDER — ATENOLOL 25 MG PO TABS: 25.0000 mg | ORAL_TABLET | Freq: Every day | ORAL | 0 refills | Status: DC

## 2016-10-10 ENCOUNTER — Encounter (HOSPITAL_BASED_OUTPATIENT_CLINIC_OR_DEPARTMENT_OTHER): Payer: Self-pay | Admitting: *Deleted

## 2016-10-10 ENCOUNTER — Inpatient Hospital Stay (HOSPITAL_BASED_OUTPATIENT_CLINIC_OR_DEPARTMENT_OTHER)
Admission: EM | Admit: 2016-10-10 | Discharge: 2016-10-16 | DRG: 392 | Disposition: A | Discharge status: Home / Self Care | Payer: BC Managed Care – PPO | Attending: Family Medicine | Admitting: Family Medicine

## 2016-10-10 ENCOUNTER — Emergency Department (HOSPITAL_BASED_OUTPATIENT_CLINIC_OR_DEPARTMENT_OTHER): Payer: BC Managed Care – PPO

## 2016-10-10 ENCOUNTER — Telehealth: Payer: Self-pay | Admitting: Gastroenterology

## 2016-10-10 DIAGNOSIS — Z882 Allergy status to sulfonamides status: Secondary | ICD-10-CM | POA: Diagnosis not present

## 2016-10-10 DIAGNOSIS — H269 Unspecified cataract: Secondary | ICD-10-CM | POA: Diagnosis present

## 2016-10-10 DIAGNOSIS — A0472 Enterocolitis due to Clostridium difficile, not specified as recurrent: Secondary | ICD-10-CM | POA: Diagnosis not present

## 2016-10-10 DIAGNOSIS — M797 Fibromyalgia: Secondary | ICD-10-CM | POA: Diagnosis present

## 2016-10-10 DIAGNOSIS — E0500 Thyrotoxicosis with diffuse goiter without thyrotoxic crisis or storm: Secondary | ICD-10-CM | POA: Diagnosis present

## 2016-10-10 DIAGNOSIS — K5732 Diverticulitis of large intestine without perforation or abscess without bleeding: Principal | ICD-10-CM | POA: Diagnosis present

## 2016-10-10 DIAGNOSIS — Z887 Allergy status to serum and vaccine status: Secondary | ICD-10-CM

## 2016-10-10 DIAGNOSIS — E876 Hypokalemia: Secondary | ICD-10-CM | POA: Diagnosis present

## 2016-10-10 DIAGNOSIS — Z841 Family history of disorders of kidney and ureter: Secondary | ICD-10-CM | POA: Diagnosis not present

## 2016-10-10 DIAGNOSIS — R1032 Left lower quadrant pain: Secondary | ICD-10-CM | POA: Diagnosis present

## 2016-10-10 DIAGNOSIS — Z886 Allergy status to analgesic agent status: Secondary | ICD-10-CM | POA: Diagnosis not present

## 2016-10-10 DIAGNOSIS — Z8249 Family history of ischemic heart disease and other diseases of the circulatory system: Secondary | ICD-10-CM | POA: Diagnosis not present

## 2016-10-10 DIAGNOSIS — R140 Abdominal distension (gaseous): Secondary | ICD-10-CM

## 2016-10-10 DIAGNOSIS — K5792 Diverticulitis of intestine, part unspecified, without perforation or abscess without bleeding: Secondary | ICD-10-CM | POA: Diagnosis present

## 2016-10-10 DIAGNOSIS — I10 Essential (primary) hypertension: Secondary | ICD-10-CM | POA: Diagnosis present

## 2016-10-10 DIAGNOSIS — R109 Unspecified abdominal pain: Secondary | ICD-10-CM

## 2016-10-10 DIAGNOSIS — Z833 Family history of diabetes mellitus: Secondary | ICD-10-CM | POA: Diagnosis not present

## 2016-10-10 DIAGNOSIS — Z90710 Acquired absence of both cervix and uterus: Secondary | ICD-10-CM | POA: Diagnosis not present

## 2016-10-10 DIAGNOSIS — Z8349 Family history of other endocrine, nutritional and metabolic diseases: Secondary | ICD-10-CM | POA: Diagnosis not present

## 2016-10-10 DIAGNOSIS — I248 Other forms of acute ischemic heart disease: Secondary | ICD-10-CM | POA: Diagnosis not present

## 2016-10-10 DIAGNOSIS — Z888 Allergy status to other drugs, medicaments and biological substances status: Secondary | ICD-10-CM

## 2016-10-10 DIAGNOSIS — K589 Irritable bowel syndrome without diarrhea: Secondary | ICD-10-CM | POA: Diagnosis present

## 2016-10-10 DIAGNOSIS — K8689 Other specified diseases of pancreas: Secondary | ICD-10-CM | POA: Diagnosis not present

## 2016-10-10 DIAGNOSIS — Z883 Allergy status to other anti-infective agents status: Secondary | ICD-10-CM

## 2016-10-10 DIAGNOSIS — Z7951 Long term (current) use of inhaled steroids: Secondary | ICD-10-CM

## 2016-10-10 DIAGNOSIS — Z79899 Other long term (current) drug therapy: Secondary | ICD-10-CM

## 2016-10-10 DIAGNOSIS — J45909 Unspecified asthma, uncomplicated: Secondary | ICD-10-CM | POA: Diagnosis present

## 2016-10-10 DIAGNOSIS — K219 Gastro-esophageal reflux disease without esophagitis: Secondary | ICD-10-CM | POA: Diagnosis not present

## 2016-10-10 DIAGNOSIS — Z881 Allergy status to other antibiotic agents status: Secondary | ICD-10-CM | POA: Diagnosis not present

## 2016-10-10 DIAGNOSIS — E785 Hyperlipidemia, unspecified: Secondary | ICD-10-CM | POA: Diagnosis present

## 2016-10-10 DIAGNOSIS — Z885 Allergy status to narcotic agent status: Secondary | ICD-10-CM

## 2016-10-10 LAB — COMPREHENSIVE METABOLIC PANEL
ALBUMIN: 3.8 g/dL (ref 3.5–5.0)
ALK PHOS: 61 U/L (ref 38–126)
ALT: 26 U/L (ref 14–54)
ANION GAP: 7 (ref 5–15)
AST: 25 U/L (ref 15–41)
BUN: 11 mg/dL (ref 6–20)
CO2: 28 mmol/L (ref 22–32)
Calcium: 9.2 mg/dL (ref 8.9–10.3)
Chloride: 104 mmol/L (ref 101–111)
Creatinine, Ser: 0.96 mg/dL (ref 0.44–1.00)
GFR calc non Af Amer: >60 mL/min (ref >60)
GLUCOSE: 92 mg/dL (ref 65–99)
Potassium: 3.9 mmol/L (ref 3.5–5.1)
SODIUM: 139 mmol/L (ref 135–145)
Total Bilirubin: 0.7 mg/dL (ref 0.3–1.2)
Total Protein: 7 g/dL (ref 6.5–8.1)

## 2016-10-10 LAB — CBC
HCT: 37.9 % (ref 36.0–46.0)
HEMOGLOBIN: 12.8 g/dL (ref 12.0–15.0)
MCH: 29 pg (ref 26.0–34.0)
MCHC: 33.8 g/dL (ref 30.0–36.0)
MCV: 85.9 fL (ref 78.0–100.0)
Platelets: 256 10*3/uL (ref 150–400)
RBC: 4.41 MIL/uL (ref 3.87–5.11)
RDW: 13.6 % (ref 11.5–15.5)
WBC: 11.1 10*3/uL — ABNORMAL HIGH (ref 4.0–10.5)

## 2016-10-10 LAB — URINALYSIS, ROUTINE W REFLEX MICROSCOPIC
BILIRUBIN URINE: NEGATIVE
Glucose, UA: NEGATIVE mg/dL
Hgb urine dipstick: NEGATIVE
KETONES UR: NEGATIVE mg/dL
LEUKOCYTES UA: NEGATIVE
NITRITE: NEGATIVE
PH: 7 (ref 5.0–8.0)
Protein, ur: NEGATIVE mg/dL
SPECIFIC GRAVITY, URINE: 1.005 (ref 1.005–1.030)

## 2016-10-10 LAB — DIFFERENTIAL
Basophils Absolute: 0 10*3/uL (ref 0.0–0.1)
Basophils Relative: 0 %
EOS ABS: 0.1 10*3/uL (ref 0.0–0.7)
EOS PCT: 1 %
LYMPHS ABS: 1.7 10*3/uL (ref 0.7–4.0)
LYMPHS PCT: 15 %
MONO ABS: 1.1 10*3/uL — AB (ref 0.1–1.0)
Monocytes Relative: 10 %
Neutro Abs: 8.3 10*3/uL — ABNORMAL HIGH (ref 1.7–7.7)
Neutrophils Relative %: 74 %

## 2016-10-10 LAB — LIPASE, BLOOD: LIPASE: 20 U/L (ref 11–51)

## 2016-10-10 MED ORDER — IOPAMIDOL (ISOVUE-300) INJECTION 61%
100.0000 mL | Freq: Once | INTRAVENOUS | Status: AC | PRN
  Administered 2016-10-10: 100 mL via INTRAVENOUS

## 2016-10-10 MED ORDER — METHIMAZOLE 5 MG PO TABS
5.0000 mg | ORAL_TABLET | Freq: Once | ORAL | Status: DC
  Filled 2016-10-10: qty 1

## 2016-10-10 MED ORDER — MOMETASONE FURO-FORMOTEROL FUM 200-5 MCG/ACT IN AERO
2.0000 | INHALATION_SPRAY | Freq: Two times a day (BID) | RESPIRATORY_TRACT | Status: DC
  Administered 2016-10-11 – 2016-10-16 (×9): 2 via RESPIRATORY_TRACT
  Filled 2016-10-10: qty 8.8

## 2016-10-10 MED ORDER — ACETAMINOPHEN 325 MG PO TABS
650.0000 mg | ORAL_TABLET | ORAL | Status: DC | PRN
  Administered 2016-10-10 – 2016-10-11 (×3): 650 mg via ORAL
  Filled 2016-10-10 (×3): qty 2

## 2016-10-10 MED ORDER — PANTOPRAZOLE SODIUM 40 MG IV SOLR
40.0000 mg | Freq: Every day | INTRAVENOUS | Status: DC
  Administered 2016-10-11 – 2016-10-14 (×4): 40 mg via INTRAVENOUS
  Filled 2016-10-10 (×5): qty 40

## 2016-10-10 MED ORDER — ALBUTEROL SULFATE (2.5 MG/3ML) 0.083% IN NEBU: 2.5000 mg | INHALATION_SOLUTION | Freq: Four times a day (QID) | RESPIRATORY_TRACT | Status: DC | PRN

## 2016-10-10 MED ORDER — FLUTICASONE PROPIONATE 50 MCG/ACT NA SUSP
2.0000 | Freq: Every day | NASAL | Status: DC
  Administered 2016-10-11 – 2016-10-16 (×6): 2 via NASAL
  Filled 2016-10-10: qty 16

## 2016-10-10 MED ORDER — SODIUM CHLORIDE 0.9 % IV BOLUS (SEPSIS)
1000.0000 mL | Freq: Once | INTRAVENOUS | Status: AC
  Administered 2016-10-10: 1000 mL via INTRAVENOUS

## 2016-10-10 MED ORDER — PIPERACILLIN-TAZOBACTAM 3.375 G IVPB
3.3750 g | Freq: Three times a day (TID) | INTRAVENOUS | Status: DC
  Administered 2016-10-10 – 2016-10-14 (×13): 3.375 g via INTRAVENOUS
  Filled 2016-10-10 (×15): qty 50

## 2016-10-10 MED ORDER — SODIUM CHLORIDE 0.9 % IV SOLN
INTRAVENOUS | Status: DC
  Administered 2016-10-10 – 2016-10-12 (×5): via INTRAVENOUS

## 2016-10-10 MED ORDER — SODIUM CHLORIDE 0.9 % IV BOLUS (SEPSIS)
500.0000 mL | Freq: Once | INTRAVENOUS | Status: AC
  Administered 2016-10-10: 500 mL via INTRAVENOUS

## 2016-10-10 MED ORDER — PIPERACILLIN-TAZOBACTAM 3.375 G IVPB 30 MIN
3.3750 g | Freq: Once | INTRAVENOUS | Status: AC
  Administered 2016-10-10: 3.375 g via INTRAVENOUS
  Filled 2016-10-10 (×2): qty 50

## 2016-10-10 MED ORDER — MORPHINE SULFATE (PF) 4 MG/ML IV SOLN
4.0000 mg | INTRAVENOUS | Status: DC | PRN
  Administered 2016-10-10 – 2016-10-15 (×26): 4 mg via INTRAVENOUS
  Filled 2016-10-10 (×27): qty 1

## 2016-10-10 MED ORDER — SODIUM CHLORIDE 0.9 % IV SOLN: INTRAVENOUS | Status: DC

## 2016-10-10 MED ORDER — ONDANSETRON HCL 4 MG/2ML IJ SOLN
4.0000 mg | Freq: Once | INTRAMUSCULAR | Status: AC
  Administered 2016-10-10: 4 mg via INTRAVENOUS
  Filled 2016-10-10: qty 2

## 2016-10-10 MED ORDER — PIPERACILLIN-TAZOBACTAM 3.375 G IVPB 30 MIN: 3.3750 g | Freq: Three times a day (TID) | INTRAVENOUS | Status: DC

## 2016-10-10 MED ORDER — MORPHINE SULFATE (PF) 4 MG/ML IV SOLN
4.0000 mg | Freq: Once | INTRAVENOUS | Status: AC
  Administered 2016-10-10: 4 mg via INTRAVENOUS
  Filled 2016-10-10: qty 1

## 2016-10-10 MED ORDER — FENTANYL CITRATE (PF) 100 MCG/2ML IJ SOLN
50.0000 ug | Freq: Once | INTRAMUSCULAR | Status: AC
  Administered 2016-10-10: 50 ug via INTRAVENOUS
  Filled 2016-10-10: qty 2

## 2016-10-10 MED ORDER — ALPRAZOLAM 0.25 MG PO TABS
0.2500 mg | ORAL_TABLET | Freq: Two times a day (BID) | ORAL | Status: DC | PRN
  Administered 2016-10-10 – 2016-10-15 (×6): 0.25 mg via ORAL
  Filled 2016-10-10 (×6): qty 1

## 2016-10-10 NOTE — Telephone Encounter (Signed)
Before I could call back, I see that patient went to the ER.

## 2016-10-10 NOTE — ED Provider Notes (Signed)
I received patient in signout from Dr. Leonette Monarch. We were awaiting CT results. ET shows persistent diverticulitis of colon with wall thickening and inflammation, trace free fluid. No evidence of perforation or abscess. Because the patient has been on Augmentin with these findings, she has failed outpatient management and will require inpatient treatment. Gave a dose of Zosyn as well as morphine. Discussed with hospitalist, Dr. Alcario Drought, and patient will be transferred to [facility] for admission.   Emily Iles, MD 10/10/16 [phone]

## 2016-10-10 NOTE — H&P (Signed)
History and Physical    Emily Osborne OVZ:[id number] DOB: [birth year] DOA: 10/10/2016  PCP: [name], DO   Patient coming from: Home.  Chief Complaint: Abdominal pain.  HPI: Emily Osborne is a 56 y.o. female with medical history significant of allergy, asthma, cataracts, elevated blood pressure, fibromyalgia, Grace disease hyperthyroidism, GERD, hyperlipidemia, IBS, herpes zoster was coming to the emergency department with complaints of worsening abdominal pain for the past 2 days.  Per patient, she was diagnosed about 2 weeks ago with diverticulitis. She was prescribed Augmentin which she just finished yesterday. However, for the past 2 days she has had significant increase in intensity of her left lower quadrant pain, associated with nausea, but no emesis, mildly loose stools, but no diarrhea, no melena, no hematochezia. She complains of mild dysuria at the end of urination. She denies cough, dyspnea, diaphoresis, PND, orthopnea or pitting edema of the lower extremities. She sometimes gets chest pain and palpitations, but these symptoms have stopped since she resume her atenolol for hyperthyroidism about 3 weeks ago. She underwent cardiac cath on 10/03/2016 which showed normal coronary arteries and cardiac function.  ED Course: The patient received Zosyn, analgesics and antiemetics in the emergency department. Urinalysis was normal. WBC 11.1, hemoglobin 12.9 g/dL and platelets 256. Her CMP was within normal limits.  Imaging: CT scan abdomen/pelvis with contrast showed persistent and recurrent acute diverticulitis, without perforation or abscess. There was pancreatic duct dilation of unknown clinical significance, MRCP suggested if clinically indicated. Please see full radiology report for further details.  Review of Systems: As per HPI otherwise 10 point review of systems negative.    Past Medical History:  Diagnosis Date  . Allergy   . Asthma    pt has inhaler  . Cataract     bil eyes  . Edema   . Elevated blood pressure reading without diagnosis of hypertension    "just elevated when I'm in pain" (12/07/2015)  . Fibromyalgia   . Ganglion of joint    right wrist  . GERD (gastroesophageal reflux disease)   . Herpes zoster without mention of complication   . Hyperlipidemia   . IBS (irritable bowel syndrome)   . Nontraumatic rupture of Achilles tendon   . Other acute reactions to stress   . Pain in joint, shoulder region   . Pain in joint, upper arm   . Pain in limb   . Personal history of other diseases of digestive system   . Thyroid disease    hyperthyroidism    Past Surgical History:  Procedure Laterality Date  . ABDOMINAL HYSTERECTOMY    . ACHILLES TENDON REPAIR Right 2005  . BUNIONECTOMY Bilateral    Bunionectomy 1983  . GANGLION CYST EXCISION Right    wrist  . LEFT HEART CATH AND CORONARY ANGIOGRAPHY N/A 10/03/2016   Procedure: Left Heart Cath and Coronary Angiography;  Surgeon: Burnell Blanks, MD;  Location: [facility];  Service: Cardiovascular;  Laterality: N/A;  . MENISCUS REPAIR Right 04/2014  . TUBAL LIGATION    . wisdomteeth extraction       reports that she has never smoked. She has never used smokeless tobacco. She reports that she does not drink alcohol or use drugs.  Allergies  Allergen Reactions  . Aspirin     REACTION: anaphylaxis  . Gentamicin     Eye drops turned the sclera bright red  . Ibuprofen     REACTION: anaphylaxsis  . Nsaids  REACTION: anaphylaxis  . Doxycycline     REACTION: severe nausea/vomiting  . Influenza A (H1n1) Monoval Vac     REACTION: sick for 3 weeks  . Loratadine Other (See Comments)    Fatigue/weakness  . Metronidazole     REACTION: red face/swelling  . Ranitidine Hcl     REACTION: Lips turned red /peel  . Sulfamethoxazole-Trimethoprim     REACTION: face red/peel  . Tramadol Hcl     REACTION: paranoid    Family History  Problem Relation Age of Onset  .  Hypertension Father   . Diabetes Father   . Heart disease Father   . Kidney disease Father     Died, 31  . Leukemia Brother   . Cancer Brother     myleoblastic anemia  . Hyperlipidemia Mother   . Hypertension Mother     Died, 29  . Thyroid disease Mother     Thyroid surgery  . Pernicious anemia Sister   . Thyroid disease Sister     On thyroid Rx  . Lupus Daughter   . Coronary artery disease Brother   . Hypertension Brother   . Colon cancer Neg Hx   . Esophageal cancer Neg Hx   . Rectal cancer Neg Hx   . Stomach cancer Neg Hx   . Pancreatic cancer Neg Hx     Prior to Admission medications   Medication Sig Start Date End Date Taking? Authorizing Provider  acetaminophen (TYLENOL) 500 MG tablet Take 1,000 mg by mouth every 6 (six) hours as needed for moderate pain.     Historical Provider, MD  albuterol (PROAIR HFA) 108 (90 Base) MCG/ACT inhaler USE 2 PUFFS EVERY 4 HOURS AS NEEDED FOR COUGH, WHEEZE OR SHORTNESS OF BREATH. 09/12/15   Rosalita Chessman [name], DO  albuterol (PROVENTIL) (2.5 MG/3ML) 0.083% nebulizer solution Take 3 mLs (2.5 mg total) by nebulization every 6 (six) hours as needed for wheezing or shortness of breath. 06/21/16   Rosalita Chessman [name], DO  ALPRAZolam (XANAX) 0.25 MG tablet Take 1 tablet (0.25 mg total) by mouth 3 (three) times daily as needed. Patient taking differently: Take 0.25 mg by mouth 2 (two) times daily as needed for anxiety or sleep.  09/28/16   Rosalita Chessman [name], DO  atenolol (TENORMIN) 25 MG tablet Take 1 tablet (25 mg total) by mouth daily. 10/08/16   Philemon Kingdom, MD  Blood Glucose Monitoring Suppl (FREESTYLE FREEDOM) KIT 1 Device by Does not apply route daily. 06/08/14   Rosalita Chessman [name], DO  Calcium Carb-Cholecalciferol (CALCIUM 1000 + D PO) Take 1 tablet by mouth daily.    Historical Provider, MD  cholecalciferol (VITAMIN D) 1000 units tablet Take 1,000 Units by mouth daily.     Historical Provider, MD  CLINPRO 5000 1.1 % PSTE See admin  instructions. 04/19/16   Historical Provider, MD  dexlansoprazole (DEXILANT) 60 MG capsule Take 1 capsule (60 mg total) by mouth daily. 05/14/16   Manus Gunning, MD  diclofenac sodium (VOLTAREN) 1 % GEL Apply 2 g topically 2 (two) times daily as needed (pain). For pain    Historical Provider, MD  fexofenadine (ALLEGRA) 180 MG tablet Take 1 tablet (180 mg total) by mouth daily. 09/14/13   Yvonne R Lowne [name], DO  fluticasone (FLONASE) 50 MCG/ACT nasal spray PLACE 2 SPRAYS INTO BOTH NOSTRILS DAILY. 09/07/16   Alferd Apa Lowne [name], DO  Fluticasone-Salmeterol (ADVAIR DISKUS) 250-50 MCG/DOSE AEPB Inhale 1 puff into the lungs 2 (two)  times daily. Patient taking differently: Inhale 1 puff into the lungs daily.  12/13/15   Rosalita Chessman [name], DO  FREESTYLE LITE test strip USE TO CHECK BLOOD SUGAR ONCE A DAY 01/12/16   Alferd Apa Lowne [name], DO  hyoscyamine (LEVSIN, ANASPAZ) 0.125 MG tablet Take 1 tablet (0.125 mg total) by mouth every 4 (four) hours as needed. Patient not taking: Reported on 10/05/2016 04/20/16   Brunetta Jeans, PA-C  Iron Combinations (I.L.X. B-12) ELIX Take 2 drops by mouth daily as needed (energy).     Historical Provider, MD  lidocaine (LIDODERM) 5 % APPLY 1 PATCH ON THE SKIN EVERY 12 HRS REMOVE, DISCARD PATCH WITHIN 12 HRS OR AS DIRECTED Patient taking differently: APPLY 1 PATCH ON THE SKIN EVERY 12 HRS REMOVE, DISCARD PATCH WITHIN 12 HRS AS NEEDED FOR PAIN 06/11/16   Alferd Apa Lowne [name], DO  meclizine (ANTIVERT) 32 MG tablet Take 1 tablet (32 mg total) by mouth 3 (three) times daily as needed for dizziness. 11/02/15   Gareth Morgan, MD  methimazole (TAPAZOLE) 5 MG tablet Take 0.5 tablets (2.5 mg total) by mouth daily. 10/08/16   Philemon Kingdom, MD  triamcinolone cream (KENALOG) 0.1 % APPLY TO AFFECTED AREA TWICE A DAY Patient taking differently: APPLY TO AFFECTED AREA TWICE A DAY AS NEEDED FOR RASH 09/14/16   Alferd Apa Lowne [name], DO  valACYclovir (VALTREX) 1000 MG tablet Take 1  tablet (1,000 mg total) by mouth 3 (three) times daily. Patient taking differently: Take 1,000 mg by mouth. Take 2g twice a day at first sign of fever blister, then take 1g twice daily until symptoms are gone 03/19/16   [name], DO    Physical Exam:  Constitutional: NAD, calm, comfortable, but febrile with a temperature of 100.63F. Vitals:   10/10/16 1543 10/10/16 1747 10/10/16 1958 10/10/16 2108  BP: 112/75 112/69 100/66 116/66  Pulse: 70 73 74 77  Resp: 18 18 16 16   Temp: 99.2 F (37.3 C) 99.7 F (37.6 C)    TempSrc: Oral Oral    SpO2: 99% 96% 95% 96%  Weight:      Height:       Eyes: PERRL, lids and conjunctivae normal ENMT: Mucous membranes are moist. Posterior pharynx clear of any exudate or lesions. Neck: normal, supple, no masses, no thyromegaly Respiratory: clear to auscultation bilaterally, no wheezing, no crackles. Normal respiratory effort. No accessory muscle use.  Cardiovascular: Regular rate and rhythm, no murmurs / rubs / gallops. No extremity edema. 2+ pedal pulses. No carotid bruits.  Abdomen: Bowel sounds positive. Soft, positive left lower quadrant tenderness, no guarding/rebound/masses palpated. No hepatosplenomegaly.  Musculoskeletal: no clubbing / cyanosis.Good ROM, no contractures. Normal muscle tone.  Skin: no rashes, lesions, ulcers on limited skin exam. Neurologic: CN 2-12 grossly intact. Sensation intact, DTR normal. Strength 5/5 in all 4.  Psychiatric: Normal judgment and insight. Alert and oriented x 4. Normal mood.    Labs on Admission: I have personally reviewed following labs and imaging studies  CBC:  Recent Labs Lab 10/10/16 1350  WBC 11.1*  NEUTROABS 8.3*  HGB 12.8  HCT 37.9  MCV 85.9  PLT 092   Basic Metabolic Panel:  Recent Labs Lab 10/10/16 1350  NA 139  K 3.9  CL 104  CO2 28  GLUCOSE 92  BUN 11  CREATININE 0.96  CALCIUM 9.2   GFR: Estimated Creatinine Clearance: 79.3 mL/min (by C-G formula based on SCr of  0.96 mg/dL). Liver Function Tests:  Recent Labs Lab 10/10/16 1350  AST 25  ALT 26  ALKPHOS 61  BILITOT 0.7  PROT 7.0  ALBUMIN 3.8    Recent Labs Lab 10/10/16 1350  LIPASE 20   No results for input(s): AMMONIA in the last 168 hours. Coagulation Profile: No results for input(s): INR, PROTIME in the last 168 hours. Cardiac Enzymes: No results for input(s): CKTOTAL, CKMB, CKMBINDEX, TROPONINI in the last 168 hours. BNP (last 3 results) No results for input(s): PROBNP in the last 8760 hours. HbA1C: No results for input(s): HGBA1C in the last 72 hours. CBG: No results for input(s): GLUCAP in the last 168 hours. Lipid Profile: No results for input(s): CHOL, HDL, LDLCALC, TRIG, CHOLHDL, LDLDIRECT in the last 72 hours. Thyroid Function Tests: No results for input(s): TSH, T4TOTAL, FREET4, T3FREE, THYROIDAB in the last 72 hours. Anemia Panel: No results for input(s): VITAMINB12, FOLATE, FERRITIN, TIBC, IRON, RETICCTPCT in the last 72 hours. Urine analysis:    Component Value Date/Time   COLORURINE YELLOW 10/10/2016 1310   APPEARANCEUR CLEAR 10/10/2016 1310   LABSPEC 1.005 10/10/2016 1310   PHURINE 7.0 10/10/2016 1310   GLUCOSEU NEGATIVE 10/10/2016 1310   HGBUR NEGATIVE 10/10/2016 1310   HGBUR negative 09/07/2010 0838   BILIRUBINUR NEGATIVE 10/10/2016 1310   BILIRUBINUR neg 09/28/2016 0930   KETONESUR NEGATIVE 10/10/2016 1310   PROTEINUR NEGATIVE 10/10/2016 1310   UROBILINOGEN negative 09/28/2016 0930   UROBILINOGEN negative 09/07/2010 0838   NITRITE NEGATIVE 10/10/2016 1310   LEUKOCYTESUR NEGATIVE 10/10/2016 1310    Radiological Exams on Admission: Ct Abdomen Pelvis W Contrast  Result Date: 10/10/2016 CLINICAL DATA:  Acute onset of left-sided abdominal pain and pressure. Nausea. Recently treated diverticulitis. Initial encounter. EXAM: CT ABDOMEN AND PELVIS WITH CONTRAST TECHNIQUE: Multidetector CT imaging of the abdomen and pelvis was performed using the standard  protocol following bolus administration of intravenous contrast. CONTRAST:  156m ISOVUE-300 IOPAMIDOL (ISOVUE-300) INJECTION 61% COMPARISON:  CT of the abdomen and pelvis performed 09/28/2016 FINDINGS: Lower chest: Minimal bibasilar atelectasis is noted. The visualized portions of the mediastinum are unremarkable. Hepatobiliary: The liver is unremarkable in appearance. The gallbladder is unremarkable in appearance. The common bile duct remains normal in caliber. Pancreas: There is dilatation of the pancreatic duct to 5 mm, of uncertain significance. Would correlate with pancreatic lab values. The pancreas is otherwise grossly unremarkable. Spleen: The spleen is unremarkable in appearance. Adrenals/Urinary Tract: The adrenal glands are unremarkable in appearance. The kidneys are within normal limits. There is no evidence of hydronephrosis. No renal or ureteral stones are identified. No perinephric stranding is seen. Stomach/Bowel: The stomach is unremarkable in appearance. The small bowel is within normal limits. The appendix is normal in caliber, without evidence of appendicitis. Scattered diverticulosis is noted along the entirety of the colon. There is mild focal wall thickening at the mid to distal sigmoid colon, with associated soft tissue inflammation, compatible with persistent or recurrent diverticulitis. No definite perforation or abscess formation is seen. Mild inflammation again tracks superiorly at the left lower quadrant. Trace free fluid is seen within the pelvis. This is similar in appearance to the prior study. Vascular/Lymphatic: The abdominal aorta is unremarkable in appearance. The inferior vena cava is grossly unremarkable. No retroperitoneal lymphadenopathy is seen. No pelvic sidewall lymphadenopathy is identified. Reproductive: The bladder is mildly distended and grossly unremarkable. The patient is status post hysterectomy. No suspicious adnexal masses are seen. Other: No additional soft  tissue abnormalities are seen. Musculoskeletal: No acute osseous abnormalities are identified. The visualized  musculature is unremarkable in appearance. IMPRESSION: 1. Persistent or recurrent acute diverticulitis at the mid to distal sigmoid colon, with focal wall thickening and soft tissue inflammation. Trace free fluid within the pelvis. Mild soft tissue inflammation tracks superiorly at the left lower quadrant. No definite evidence of perforation or abscess formation at this time. The appearance is similar to the prior study. 2. Scattered diverticulosis along the entirety of the colon. 3. Dilatation of the pancreatic duct to 5 mm, of uncertain significance. Would correlate with pancreatic lab values; MRCP could be considered for further evaluation as deemed clinically appropriate. Electronically Signed   By: Garald Balding M.D.   On: 10/10/2016 17:34   Left Heart Cath and Coronary Angiography on 10/03/2016   Conclusion     The left ventricular systolic function is normal.  LV end diastolic pressure is normal.  The left ventricular ejection fraction is greater than 65% by visual estimate.  There is no mitral valve regurgitation.   1. No angiographic evidence of CAD 2. Normal LV systolic function 3. Normal LVEDP  Recommendations: NO further ischemic workup.     Assessment/Plan Principal Problem:   Diverticulitis Admit to MedSurg. Continue IV fluids. Clear liquids diet. Continue analgesics as needed. Continue antiemetics as needed. Follow-up CBC and CMP in a.m.  Active Problems:   Asthma As symptomatic at this time. Continue long-acting beta agonist and inhaled steroid. Nebulized bronchodilators and supplemental oxygen as needed.    GERD Protonix 40 mg IVPB every 24 hours.    Essential hypertension Per patient, she only gets hypertensive when she has pain or Graves' disease. Continue atenolol 25 mg by mouth daily and monitor blood pressure.    Graves disease Continue  methimazole 5 mg by mouth daily. Continue atenolol 25 mg by mouth daily.    Dilated pancreatic duct. Check lipase in a.m. Consider MRCP if clinically indicated.      DVT prophylaxis:  Code Status: Full code. Family Communication:  Disposition Plan: Admitted for IV antibiotic therapy for 2-3 days. Consults called:  Admission status: Patient/MedSurg.   Reubin Milan MD [facility] Pager [phone].  If 7PM-7AM, please contact night-coverage [email or web address] Password [facility]  10/10/2016, 10:58 PM

## 2016-10-10 NOTE — ED Notes (Signed)
Pt sts site above IV is hurting; site appears WDL, no evidence of infiltration; pt requests rate decrease.

## 2016-10-10 NOTE — ED Notes (Signed)
Pt sleeping; resp even/unlabored; family at bedside. CT contrast tolerated well so far.

## 2016-10-10 NOTE — Plan of Care (Signed)
Diverticulitis since earlier this month 09/28/16.  Failed outpatient augmentin, (allergic to metronidazole) now on zosyn.  Still looks uncomplicated at this point on CT scan, but admitting to inpatient due to failed outpatient.  Also had negative heart cath on 10/03/16 for CP.

## 2016-10-10 NOTE — ED Triage Notes (Signed)
Pt c/o lower abd pain x 2 days

## 2016-10-10 NOTE — ED Notes (Signed)
Report to Joaquim Lai, Therapist, sports on 6E at [facility].

## 2016-10-10 NOTE — ED Provider Notes (Signed)
[facility] [place] [facility] Provider Note   CSN: [id number] Arrival date & time: 10/10/16  1310     History   Chief Complaint Chief Complaint  Patient presents with  . Abdominal Pain    HPI Emily Osborne is a 56 y.o. female.  The history is provided by the patient.  Abdominal Pain   This is a recurrent problem. The current episode started yesterday. The problem occurs constantly. The problem has been gradually worsening. The pain is located in the LLQ. The pain is severe. Pertinent negatives include fever, diarrhea, nausea and vomiting. The symptoms are aggravated by palpation and certain positions. Past medical history comments: diverticulitis; just completed augmentin course.    Past Medical History:  Diagnosis Date  . Allergy   . Asthma    pt has inhaler  . Cataract    bil eyes  . Edema   . Elevated blood pressure reading without diagnosis of hypertension    "just elevated when I'm in pain" (12/07/2015)  . Fibromyalgia   . Ganglion of joint    right wrist  . GERD (gastroesophageal reflux disease)   . Herpes zoster without mention of complication   . Hyperlipidemia   . IBS (irritable bowel syndrome)   . Nontraumatic rupture of Achilles tendon   . Other acute reactions to stress   . Pain in joint, shoulder region   . Pain in joint, upper arm   . Pain in limb   . Personal history of other diseases of digestive system   . Thyroid disease    hyperthyroidism    Patient Active Problem List   Diagnosis Date Noted  . Lower abdominal pain 09/29/2016  . Palpitations 02/06/2016  . Mild diastolic dysfunction 05/12/3006  . Graves disease 01/02/2016  . Acute diverticulitis 12/07/2015  . Essential hypertension 12/07/2015  . Acute bacterial sinusitis 10/04/2015  . History of colonic polyps 11/15/2014  . Left shoulder pain 09/28/2013  . Left-sided weakness 09/16/2013  . Obesity (BMI 30-39.9) 09/02/2013  . Myalgia 06/21/2011  . POSTMENOPAUSAL STATUS 09/07/2010  . PAIN  IN JOINT, MULTIPLE SITES 12/27/2009  . LOW BACK PAIN, CHRONIC 12/27/2009  . FATIGUE 12/27/2009  . VAGINITIS 11/22/2009  . DYSPEPSIA&OTHER [facility] DISORDERS FUNCTION STOMACH 09/01/2009  . NAUSEA 08/03/2009  . FLATULENCE-GAS-BLOATING 08/03/2009  . ABDOMINAL PAIN RIGHT UPPER QUADRANT 07/06/2009  . SUPRAPUBIC PAIN 07/06/2009  . GANGLION CYST, HX OF 07/06/2009  . BACK PAIN 05/04/2009  . HYPERLIPIDEMIA 09/16/2008  . UNSPECIFIED MYALGIA AND MYOSITIS 09/16/2008  . Precordial pain 07/27/2008  . GERD 06/08/2008  . HYPERGLYCEMIA, FASTING 06/08/2008  . HELICOBACTER PYLORI GASTRITIS, HX OF 04/05/2008  . OTHER ACUTE REACTIONS TO STRESS 03/16/2008  . ABDOMINAL PAIN, EPIGASTRIC 03/16/2008  . SHOULDER PAIN, RIGHT 10/01/2007  . ELBOW PAIN, RIGHT 10/01/2007  . ELEVATED BLOOD PRESSURE 04/29/2007  . Pain in limb 04/14/2007  . DEPENDENT EDEMA, RIGHT LEG 04/14/2007  . SHINGLES 04/08/2007  . Asthma 03/18/2007  . GANGLION CYST, WRIST, RIGHT 03/18/2007  . BUNIONECTOMY, HX OF 03/18/2007    Past Surgical History:  Procedure Laterality Date  . ABDOMINAL HYSTERECTOMY    . ACHILLES TENDON REPAIR Right 2005  . BUNIONECTOMY Bilateral    Bunionectomy 1983  . GANGLION CYST EXCISION Right    wrist  . LEFT HEART CATH AND CORONARY ANGIOGRAPHY N/A 10/03/2016   Procedure: Left Heart Cath and Coronary Angiography;  Surgeon: Burnell Blanks, MD;  Location: [facility];  Service: Cardiovascular;  Laterality: N/A;  . MENISCUS REPAIR Right 04/2014  .  TUBAL LIGATION    . wisdomteeth extraction      OB History    No data available       Home Medications    Prior to Admission medications   Medication Sig Start Date End Date Taking? Authorizing Provider  acetaminophen (TYLENOL) 500 MG tablet Take 1,000 mg by mouth every 6 (six) hours as needed for moderate pain.     Historical Provider, MD  albuterol (PROAIR HFA) 108 (90 Base) MCG/ACT inhaler USE 2 PUFFS EVERY 4 HOURS AS NEEDED FOR COUGH, WHEEZE OR  SHORTNESS OF BREATH. 09/12/15   Rosalita Chessman [name], DO  albuterol (PROVENTIL) (2.5 MG/3ML) 0.083% nebulizer solution Take 3 mLs (2.5 mg total) by nebulization every 6 (six) hours as needed for wheezing or shortness of breath. 06/21/16   Rosalita Chessman [name], DO  ALPRAZolam (XANAX) 0.25 MG tablet Take 1 tablet (0.25 mg total) by mouth 3 (three) times daily as needed. Patient taking differently: Take 0.25 mg by mouth 2 (two) times daily as needed for anxiety or sleep.  09/28/16   Rosalita Chessman [name], DO  atenolol (TENORMIN) 25 MG tablet Take 1 tablet (25 mg total) by mouth daily. 10/08/16   Philemon Kingdom, MD  Blood Glucose Monitoring Suppl (FREESTYLE FREEDOM) KIT 1 Device by Does not apply route daily. 06/08/14   Rosalita Chessman [name], DO  Calcium Carb-Cholecalciferol (CALCIUM 1000 + D PO) Take 1 tablet by mouth daily.    Historical Provider, MD  cholecalciferol (VITAMIN D) 1000 units tablet Take 1,000 Units by mouth daily.     Historical Provider, MD  CLINPRO 5000 1.1 % PSTE See admin instructions. 04/19/16   Historical Provider, MD  dexlansoprazole (DEXILANT) 60 MG capsule Take 1 capsule (60 mg total) by mouth daily. 05/14/16   Manus Gunning, MD  diclofenac sodium (VOLTAREN) 1 % GEL Apply 2 g topically 2 (two) times daily as needed (pain). For pain    Historical Provider, MD  fexofenadine (ALLEGRA) 180 MG tablet Take 1 tablet (180 mg total) by mouth daily. 09/14/13   Yvonne R Lowne [name], DO  fluticasone (FLONASE) 50 MCG/ACT nasal spray PLACE 2 SPRAYS INTO BOTH NOSTRILS DAILY. 09/07/16   Alferd Apa Lowne [name], DO  Fluticasone-Salmeterol (ADVAIR DISKUS) 250-50 MCG/DOSE AEPB Inhale 1 puff into the lungs 2 (two) times daily. Patient taking differently: Inhale 1 puff into the lungs daily.  12/13/15   Rosalita Chessman [name], DO  FREESTYLE LITE test strip USE TO CHECK BLOOD SUGAR ONCE A DAY 01/12/16   Alferd Apa Lowne [name], DO  hyoscyamine (LEVSIN, ANASPAZ) 0.125 MG tablet Take 1 tablet (0.125 mg total) by  mouth every 4 (four) hours as needed. Patient not taking: Reported on 10/05/2016 04/20/16   Brunetta Jeans, PA-C  Iron Combinations (I.L.X. B-12) ELIX Take 2 drops by mouth daily as needed (energy).     Historical Provider, MD  lidocaine (LIDODERM) 5 % APPLY 1 PATCH ON THE SKIN EVERY 12 HRS REMOVE, DISCARD PATCH WITHIN 12 HRS OR AS DIRECTED Patient taking differently: APPLY 1 PATCH ON THE SKIN EVERY 12 HRS REMOVE, DISCARD PATCH WITHIN 12 HRS AS NEEDED FOR PAIN 06/11/16   Alferd Apa Lowne [name], DO  meclizine (ANTIVERT) 32 MG tablet Take 1 tablet (32 mg total) by mouth 3 (three) times daily as needed for dizziness. 11/02/15   Gareth Morgan, MD  methimazole (TAPAZOLE) 5 MG tablet Take 0.5 tablets (2.5 mg total) by mouth daily. 10/08/16   Philemon Kingdom, MD  triamcinolone cream (KENALOG) 0.1 % APPLY TO AFFECTED AREA TWICE A DAY Patient taking differently: APPLY TO AFFECTED AREA TWICE A DAY AS NEEDED FOR RASH 09/14/16   Alferd Apa Lowne [name], DO  valACYclovir (VALTREX) 1000 MG tablet Take 1 tablet (1,000 mg total) by mouth 3 (three) times daily. Patient taking differently: Take 1,000 mg by mouth. Take 2g twice a day at first sign of fever blister, then take 1g twice daily until symptoms are gone 03/19/16   [name], DO    Family History Family History  Problem Relation Age of Onset  . Hypertension Father   . Diabetes Father   . Heart disease Father   . Kidney disease Father     Died, 1  . Leukemia Brother   . Cancer Brother     myleoblastic anemia  . Hyperlipidemia Mother   . Hypertension Mother     Died, 24  . Thyroid disease Mother     Thyroid surgery  . Pernicious anemia Sister   . Thyroid disease Sister     On thyroid Rx  . Lupus Daughter   . Coronary artery disease Brother   . Hypertension Brother   . Colon cancer Neg Hx   . Esophageal cancer Neg Hx   . Rectal cancer Neg Hx   . Stomach cancer Neg Hx   . Pancreatic cancer Neg Hx     Social History Social History    Substance Use Topics  . Smoking status: Never Smoker  . Smokeless tobacco: Never Used  . Alcohol use No     Allergies   Aspirin; Gentamicin; Ibuprofen; Nsaids; Doxycycline; Influenza a (h1n1) monoval vac; Loratadine; Metronidazole; Ranitidine hcl; Sulfamethoxazole-trimethoprim; and Tramadol hcl   Review of Systems Review of Systems  Constitutional: Negative for fever.  Gastrointestinal: Positive for abdominal pain. Negative for diarrhea, nausea and vomiting.   Ten systems are reviewed and are negative for acute change except as noted in the HPI   Physical Exam Updated Vital Signs BP 111/78   Pulse 69   Temp 99.6 F (37.6 C)   Resp 18   Ht 5' 5"  (1.651 m)   Wt 235 lb (106.6 kg)   SpO2 100%   BMI 39.11 kg/m   Physical Exam  Constitutional: She is oriented to person, place, and time. She appears well-developed and well-nourished. No distress.  HENT:  Head: Normocephalic and atraumatic.  Nose: Nose normal.  Eyes: Conjunctivae and EOM are normal. Pupils are equal, round, and reactive to light. Right eye exhibits no discharge. Left eye exhibits no discharge. No scleral icterus.  Neck: Normal range of motion. Neck supple.  Cardiovascular: Normal rate and regular rhythm.  Exam reveals no gallop and no friction rub.   No murmur heard. Pulmonary/Chest: Effort normal and breath sounds normal. No stridor. No respiratory distress. She has no rales.  Abdominal: Soft. She exhibits no distension. There is tenderness in the suprapubic area and left lower quadrant. There is rebound. There is no rigidity and no guarding.  Musculoskeletal: She exhibits no edema or tenderness.  Neurological: She is alert and oriented to person, place, and time.  Skin: Skin is warm and dry. No rash noted. She is not diaphoretic. No erythema.  Psychiatric: She has a normal mood and affect.  Vitals reviewed.    ED Treatments / Results  Labs (all labs ordered are listed, but only abnormal results are  displayed) Labs Reviewed  CBC - Abnormal; Notable for the following:  Result Value   WBC 11.1 (*)    All other components within normal limits  DIFFERENTIAL - Abnormal; Notable for the following:    Neutro Abs 8.3 (*)    Monocytes Absolute 1.1 (*)    All other components within normal limits  URINALYSIS, ROUTINE W REFLEX MICROSCOPIC  LIPASE, BLOOD  COMPREHENSIVE METABOLIC PANEL  CBC WITH DIFFERENTIAL/PLATELET    EKG  EKG Interpretation None       Radiology No results found.  Procedures Procedures (including critical care time)  Medications Ordered in ED Medications  iopamidol (ISOVUE-300) 61 % injection 100 mL (not administered)  fentaNYL (SUBLIMAZE) injection 50 mcg (50 mcg Intravenous Given 10/10/16 1454)     Initial Impression / Assessment and Plan / ED Course  I have reviewed the triage vital signs and the nursing notes.  Pertinent labs & imaging results that were available during my care of the patient were reviewed by me and considered in my medical decision making (see chart for details).     Presentation concerning for failure of outpatient management for diverticulitis which may lead to complications such as abscess formation or perforation. Screening labs obtained which did reveal mild leukocytosis. UA without evidence of infection. Currently pending CT scan.  Patient provided with pain medicine.  Patient care turned over to Dr Rex Kras at 1630. Patient case and results discussed in detail; please see their note for further ED managment.       Fatima Blank, MD 10/10/16 [phone]

## 2016-10-11 ENCOUNTER — Inpatient Hospital Stay (HOSPITAL_COMMUNITY): Payer: BC Managed Care – PPO

## 2016-10-11 DIAGNOSIS — I10 Essential (primary) hypertension: Secondary | ICD-10-CM

## 2016-10-11 DIAGNOSIS — K5732 Diverticulitis of large intestine without perforation or abscess without bleeding: Principal | ICD-10-CM

## 2016-10-11 DIAGNOSIS — J45909 Unspecified asthma, uncomplicated: Secondary | ICD-10-CM

## 2016-10-11 DIAGNOSIS — E0500 Thyrotoxicosis with diffuse goiter without thyrotoxic crisis or storm: Secondary | ICD-10-CM

## 2016-10-11 LAB — COMPREHENSIVE METABOLIC PANEL
ALBUMIN: 3 g/dL — AB (ref 3.5–5.0)
ALK PHOS: 56 U/L (ref 38–126)
ALT: 22 U/L (ref 14–54)
ANION GAP: 9 (ref 5–15)
AST: 19 U/L (ref 15–41)
BILIRUBIN TOTAL: 1.2 mg/dL (ref 0.3–1.2)
BUN: 7 mg/dL (ref 6–20)
CALCIUM: 8.3 mg/dL — AB (ref 8.9–10.3)
CO2: 25 mmol/L (ref 22–32)
CREATININE: 0.98 mg/dL (ref 0.44–1.00)
Chloride: 106 mmol/L (ref 101–111)
GFR calc Af Amer: >60 mL/min (ref >60)
GFR calc non Af Amer: >60 mL/min (ref >60)
GLUCOSE: 102 mg/dL — AB (ref 65–99)
Potassium: 3.6 mmol/L (ref 3.5–5.1)
Sodium: 140 mmol/L (ref 135–145)
TOTAL PROTEIN: 5.8 g/dL — AB (ref 6.5–8.1)

## 2016-10-11 LAB — LIPASE, BLOOD: Lipase: 35 U/L (ref 11–51)

## 2016-10-11 LAB — CBC WITH DIFFERENTIAL/PLATELET
BASOS PCT: 0 %
Basophils Absolute: 0 10*3/uL (ref 0.0–0.1)
Eosinophils Absolute: 0.1 10*3/uL (ref 0.0–0.7)
Eosinophils Relative: 0 %
HEMATOCRIT: 34.7 % — AB (ref 36.0–46.0)
HEMOGLOBIN: 11.4 g/dL — AB (ref 12.0–15.0)
Lymphocytes Relative: 11 %
Lymphs Abs: 1.3 10*3/uL (ref 0.7–4.0)
MCH: 28.4 pg (ref 26.0–34.0)
MCHC: 32.9 g/dL (ref 30.0–36.0)
MCV: 86.5 fL (ref 78.0–100.0)
MONOS PCT: 12 %
Monocytes Absolute: 1.6 10*3/uL — ABNORMAL HIGH (ref 0.1–1.0)
NEUTROS ABS: 9.5 10*3/uL — AB (ref 1.7–7.7)
NEUTROS PCT: 77 %
Platelets: 204 10*3/uL (ref 150–400)
RBC: 4.01 MIL/uL (ref 3.87–5.11)
RDW: 14.2 % (ref 11.5–15.5)
WBC: 12.5 10*3/uL — ABNORMAL HIGH (ref 4.0–10.5)

## 2016-10-11 MED ORDER — SODIUM CHLORIDE 0.9 % IV BOLUS (SEPSIS): 500.0000 mL | Freq: Once | INTRAVENOUS | Status: DC

## 2016-10-11 MED ORDER — ATENOLOL 25 MG PO TABS
25.0000 mg | ORAL_TABLET | Freq: Every day | ORAL | Status: DC
  Administered 2016-10-11: 25 mg via ORAL
  Filled 2016-10-11 (×2): qty 1

## 2016-10-11 MED ORDER — ONDANSETRON HCL 4 MG/2ML IJ SOLN
4.0000 mg | Freq: Four times a day (QID) | INTRAMUSCULAR | Status: DC | PRN
  Administered 2016-10-11 – 2016-10-15 (×11): 4 mg via INTRAVENOUS
  Filled 2016-10-11 (×12): qty 2

## 2016-10-11 MED ORDER — METHIMAZOLE 5 MG PO TABS
5.0000 mg | ORAL_TABLET | Freq: Every day | ORAL | Status: DC
  Administered 2016-10-12 – 2016-10-16 (×5): 5 mg via ORAL
  Filled 2016-10-11 (×6): qty 1

## 2016-10-11 MED ORDER — ENOXAPARIN SODIUM 40 MG/0.4ML ~~LOC~~ SOLN
40.0000 mg | SUBCUTANEOUS | Status: DC
  Administered 2016-10-11: 40 mg via SUBCUTANEOUS
  Filled 2016-10-11 (×2): qty 0.4

## 2016-10-11 MED ORDER — ACETAMINOPHEN 10 MG/ML IV SOLN
1000.0000 mg | Freq: Four times a day (QID) | INTRAVENOUS | Status: AC
  Administered 2016-10-11 – 2016-10-12 (×4): 1000 mg via INTRAVENOUS
  Filled 2016-10-11 (×4): qty 100

## 2016-10-11 NOTE — Progress Notes (Signed)
PROGRESS NOTE [facility]   Emily Osborne   [id number] DOB: [birth year]  DOA: 10/10/2016 PCP: [name], DO   Brief Narrative:  Emily Osborne is a 56 y.o. female with medical history significant of allergy, asthma, cataracts, elevated blood pressure, fibromyalgia, Grace disease hyperthyroidism, GERD, hyperlipidemia, IBS, herpes zoster was coming to the emergency department with complaints of worsening abdominal pain for the past 2 days. Patient been treated for diverticulitis and was on Augmentin, however for the past 2 days she has an increase in LLQ pain. Patient had CT scan abdomen/pelvis with contrast showing persistent and recurrent acute diverticulitis without perforation or abscess. Patient is admitted for failing outpatient therapy for diverticulitis, to be treated with IV antibiotics.  Subjective: Patient seen and examined daughter at bedside. She is complaining of left lower quadrant pain and nausea. Tolerating clear liquids. Patient has been febrile.  Assessment & Plan: Diverticulitis failed outpatient therapy. CT with no evidence of abscess or perforation  Started in IV Zosyn, patient allergic to Flagyl  Send blood cultures  Continue clear liquids for now  Tylenol PRN fever  Pain control as needed  Continue IVF   Asthma - stable  Continue home meds   GERD Protonix 40 mg IVPB every 24 hours.  Essential hypertension Per patient, she only gets hypertensive when she has pain or Graves' disease. Continue atenolol 25 mg by mouth daily and monitor blood pressure.  Graves disease Continue methimazole 5 mg by mouth daily. Continue atenolol 25 mg by mouth daily.  Dilated pancreatic duct. Normal lipase - no clinical significance at this point  Will continue to monitor   DVT prophylaxis: (Lovenox/Heparin/SCD's/anticoagulated/None (if comfort care) Code Status: FULL Family Communication: Daughter at bedside  Disposition Plan: Anticipate discharge  back to previous environment when medically stable   Consultants:   None   Procedures:   None   Antimicrobials:  Zosyn 2/21   Objective: Vitals:   10/11/16 0427 10/11/16 0605 10/11/16 0952 10/11/16 1057  BP:  103/61 128/64   Pulse:  78 80   Resp:  18 17   Temp: 100.3 F (37.9 C) 99.7 F (37.6 C) (!) 100.9 F (38.3 C) (!) 101.4 F (38.6 C)  TempSrc:  Oral Oral   SpO2:  95% 100%   Weight:      Height:        Intake/Output Summary (Last 24 hours) at 10/11/16 1104 Last data filed at 10/11/16 0934  Gross per 24 hour  Intake          1912.33 ml  Output                0 ml  Net          1912.33 ml   Filed Weights   10/10/16 1318 10/10/16 2308  Weight: 106.6 kg (235 lb) 108.4 kg (238 lb 15.7 oz)    Examination:  General exam: Appears calm and comfortable  Respiratory system: Clear to auscultation. No wheezes,crackle or rhonchi Cardiovascular system: S1 & S2 heard, RRR. No JVD, murmurs, rubs or gallops Gastrointestinal system: Soft, diffuse tenderness predominant in the lower quadrants, guarding in LLQ Central nervous system: Alert and oriented. No focal neurological deficits. Extremities: No pedal edema.  Skin: No rashes, lesions or ulcers Psychiatry: Judgement and insight appear normal. Mood & affect appropriate.    Data Reviewed: I have personally reviewed following labs and imaging studies  CBC:  Recent Labs Lab 10/10/16 1350 10/11/16 0639  WBC 11.1* 12.5*  NEUTROABS 8.3* 9.5*  HGB 12.8 11.4*  HCT 37.9 34.7*  MCV 85.9 86.5  PLT 256 0000000   Basic Metabolic Panel:  Recent Labs Lab 10/10/16 1350 10/11/16 0639  NA 139 140  K 3.9 3.6  CL 104 106  CO2 28 25  GLUCOSE 92 102*  BUN 11 7  CREATININE 0.96 0.98  CALCIUM 9.2 8.3*   GFR: Estimated Creatinine Clearance: 78.5 mL/min (by C-G formula based on SCr of 0.98 mg/dL). Liver Function Tests:  Recent Labs Lab 10/10/16 1350 10/11/16 0639  AST 25 19  ALT 26 22  ALKPHOS 61 56  BILITOT 0.7  1.2  PROT 7.0 5.8*  ALBUMIN 3.8 3.0*    Recent Labs Lab 10/10/16 1350 10/11/16 0639  LIPASE 20 35   No results for input(s): AMMONIA in the last 168 hours. Coagulation Profile: No results for input(s): INR, PROTIME in the last 168 hours. Cardiac Enzymes: No results for input(s): CKTOTAL, CKMB, CKMBINDEX, TROPONINI in the last 168 hours. BNP (last 3 results) No results for input(s): PROBNP in the last 8760 hours. HbA1C: No results for input(s): HGBA1C in the last 72 hours. CBG: No results for input(s): GLUCAP in the last 168 hours. Lipid Profile: No results for input(s): CHOL, HDL, LDLCALC, TRIG, CHOLHDL, LDLDIRECT in the last 72 hours. Thyroid Function Tests: No results for input(s): TSH, T4TOTAL, FREET4, T3FREE, THYROIDAB in the last 72 hours. Anemia Panel: No results for input(s): VITAMINB12, FOLATE, FERRITIN, TIBC, IRON, RETICCTPCT in the last 72 hours. Sepsis Labs: No results for input(s): PROCALCITON, LATICACIDVEN in the last 168 hours.  No results found for this or any previous visit (from the past 240 hour(s)).    Radiology Studies: Ct Abdomen Pelvis W Contrast  Result Date: 10/10/2016 CLINICAL DATA:  Acute onset of left-sided abdominal pain and pressure. Nausea. Recently treated diverticulitis. Initial encounter. EXAM: CT ABDOMEN AND PELVIS WITH CONTRAST TECHNIQUE: Multidetector CT imaging of the abdomen and pelvis was performed using the standard protocol following bolus administration of intravenous contrast. CONTRAST:  168mL ISOVUE-300 IOPAMIDOL (ISOVUE-300) INJECTION 61% COMPARISON:  CT of the abdomen and pelvis performed 09/28/2016 FINDINGS: Lower chest: Minimal bibasilar atelectasis is noted. The visualized portions of the mediastinum are unremarkable. Hepatobiliary: The liver is unremarkable in appearance. The gallbladder is unremarkable in appearance. The common bile duct remains normal in caliber. Pancreas: There is dilatation of the pancreatic duct to 5 mm, of  uncertain significance. Would correlate with pancreatic lab values. The pancreas is otherwise grossly unremarkable. Spleen: The spleen is unremarkable in appearance. Adrenals/Urinary Tract: The adrenal glands are unremarkable in appearance. The kidneys are within normal limits. There is no evidence of hydronephrosis. No renal or ureteral stones are identified. No perinephric stranding is seen. Stomach/Bowel: The stomach is unremarkable in appearance. The small bowel is within normal limits. The appendix is normal in caliber, without evidence of appendicitis. Scattered diverticulosis is noted along the entirety of the colon. There is mild focal wall thickening at the mid to distal sigmoid colon, with associated soft tissue inflammation, compatible with persistent or recurrent diverticulitis. No definite perforation or abscess formation is seen. Mild inflammation again tracks superiorly at the left lower quadrant. Trace free fluid is seen within the pelvis. This is similar in appearance to the prior study. Vascular/Lymphatic: The abdominal aorta is unremarkable in appearance. The inferior vena cava is grossly unremarkable. No retroperitoneal lymphadenopathy is seen. No pelvic sidewall lymphadenopathy is identified. Reproductive: The bladder is mildly distended and grossly unremarkable. The patient is status post  hysterectomy. No suspicious adnexal masses are seen. Other: No additional soft tissue abnormalities are seen. Musculoskeletal: No acute osseous abnormalities are identified. The visualized musculature is unremarkable in appearance. IMPRESSION: 1. Persistent or recurrent acute diverticulitis at the mid to distal sigmoid colon, with focal wall thickening and soft tissue inflammation. Trace free fluid within the pelvis. Mild soft tissue inflammation tracks superiorly at the left lower quadrant. No definite evidence of perforation or abscess formation at this time. The appearance is similar to the prior study. 2.  Scattered diverticulosis along the entirety of the colon. 3. Dilatation of the pancreatic duct to 5 mm, of uncertain significance. Would correlate with pancreatic lab values; MRCP could be considered for further evaluation as deemed clinically appropriate. Electronically Signed   By: Garald Balding M.D.   On: 10/10/2016 17:34      Scheduled Meds: . acetaminophen  1,000 mg Intravenous Q6H  . atenolol  25 mg Oral Daily  . fluticasone  2 spray Each Nare Daily  . methimazole  5 mg Oral Daily  . mometasone-formoterol  2 puff Inhalation BID  . pantoprazole (PROTONIX) IV  40 mg Intravenous QHS  . piperacillin-tazobactam (ZOSYN)  IV  3.375 g Intravenous Q8H   Continuous Infusions: . sodium chloride 100 mL/hr at 10/11/16 0429     LOS: 1 day    Chipper Oman, MD Pager: Text Page via [email or web address]  [phone]  If 7PM-7AM, please contact night-coverage [email or web address] Password [email or web address] 10/11/2016, 11:04 AM

## 2016-10-11 NOTE — Progress Notes (Signed)
RN sent page to attending MD regarding patient's temperature of 100.9. RN administered acetaminophen 650 mg as ordered PRN for elevated temperature.

## 2016-10-11 NOTE — Progress Notes (Signed)
New Admission Note:  Arrival Method: Stretcher with [facility] Mental Orientation: A&O x4 Telemetry: N/A Assessment: Completed Skin: Assessed with Blanch Media, RN, no skin issues noted IV: R AC, saline locked Pain: 8/10, awaiting orders from MD Tubes: N/A Safety Measures: Safety Fall Prevention Plan discussed with patient Admission: Completed [facility] [address] Orientation: Patient has been orientated to the room, unit and the staff. Family: Two daughters at bedside  Orders have been reviewed and implemented. Will continue to monitor the patient. Call light has been placed within reach.  Nena Polio BSN, RN  Phone Number: [phone]

## 2016-10-12 LAB — COMPREHENSIVE METABOLIC PANEL
ALK PHOS: 57 U/L (ref 38–126)
ALT: 18 U/L (ref 14–54)
ANION GAP: 8 (ref 5–15)
AST: 16 U/L (ref 15–41)
Albumin: 2.7 g/dL — ABNORMAL LOW (ref 3.5–5.0)
BILIRUBIN TOTAL: 1.5 mg/dL — AB (ref 0.3–1.2)
BUN: <5 mg/dL — ABNORMAL LOW (ref 6–20)
CALCIUM: 7.9 mg/dL — AB (ref 8.9–10.3)
CO2: 24 mmol/L (ref 22–32)
CREATININE: 0.92 mg/dL (ref 0.44–1.00)
Chloride: 106 mmol/L (ref 101–111)
Glucose, Bld: 95 mg/dL (ref 65–99)
Potassium: 3.4 mmol/L — ABNORMAL LOW (ref 3.5–5.1)
SODIUM: 138 mmol/L (ref 135–145)
TOTAL PROTEIN: 5.5 g/dL — AB (ref 6.5–8.1)

## 2016-10-12 LAB — C DIFFICILE QUICK SCREEN W PCR REFLEX
C DIFFICILE (CDIFF) INTERP: DETECTED
C DIFFICILE (CDIFF) TOXIN: POSITIVE — AB
C DIFFICLE (CDIFF) ANTIGEN: POSITIVE — AB

## 2016-10-12 LAB — CBC WITH DIFFERENTIAL/PLATELET
Basophils Absolute: 0 10*3/uL (ref 0.0–0.1)
Basophils Relative: 0 %
Eosinophils Absolute: 0 10*3/uL (ref 0.0–0.7)
Eosinophils Relative: 0 %
HCT: 34.1 % — ABNORMAL LOW (ref 36.0–46.0)
HEMOGLOBIN: 11.2 g/dL — AB (ref 12.0–15.0)
LYMPHS ABS: 1.3 10*3/uL (ref 0.7–4.0)
LYMPHS PCT: 9 %
MCH: 28.6 pg (ref 26.0–34.0)
MCHC: 32.8 g/dL (ref 30.0–36.0)
MCV: 87 fL (ref 78.0–100.0)
MONOS PCT: 11 %
Monocytes Absolute: 1.5 10*3/uL — ABNORMAL HIGH (ref 0.1–1.0)
NEUTROS PCT: 80 %
Neutro Abs: 11 10*3/uL — ABNORMAL HIGH (ref 1.7–7.7)
Platelets: 187 10*3/uL (ref 150–400)
RBC: 3.92 MIL/uL (ref 3.87–5.11)
RDW: 14.3 % (ref 11.5–15.5)
WBC: 13.8 10*3/uL — ABNORMAL HIGH (ref 4.0–10.5)

## 2016-10-12 MED ORDER — VANCOMYCIN 50 MG/ML ORAL SOLUTION
125.0000 mg | Freq: Four times a day (QID) | ORAL | Status: DC
  Administered 2016-10-12 – 2016-10-13 (×5): 125 mg via ORAL
  Filled 2016-10-12 (×6): qty 2.5

## 2016-10-12 MED ORDER — ATENOLOL 25 MG PO TABS
12.5000 mg | ORAL_TABLET | Freq: Every day | ORAL | Status: DC
  Administered 2016-10-12 – 2016-10-16 (×5): 12.5 mg via ORAL
  Filled 2016-10-12 (×4): qty 1

## 2016-10-12 MED ORDER — ACETAMINOPHEN 500 MG PO TABS
1000.0000 mg | ORAL_TABLET | Freq: Four times a day (QID) | ORAL | Status: DC | PRN
  Administered 2016-10-12 – 2016-10-16 (×10): 1000 mg via ORAL
  Filled 2016-10-12 (×10): qty 2

## 2016-10-12 MED ORDER — PROMETHAZINE HCL 25 MG/ML IJ SOLN
12.5000 mg | Freq: Once | INTRAMUSCULAR | Status: AC
  Administered 2016-10-12: 12.5 mg via INTRAVENOUS
  Filled 2016-10-12: qty 1

## 2016-10-12 NOTE — Care Management Note (Signed)
Case Management Note  Patient Details  Name: Emily Osborne MRN: [id number] Date of Birth: [birth year]  Subjective/Objective:      CM following for progression and d/c planning.               Action/Plan: 10/12/2016 No HH or DME needs anticipated. 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 Independent prior to admission. Will assist if needed.   Expected Discharge Date:                  Expected Discharge Plan:  Home/Self Care  In-House Referral:  NA  Discharge planning Services  NA  Post Acute Care Choice:  NA Choice offered to:  NA  DME Arranged:   NA DME Agency:   NA  HH Arranged:   NA HH Agency:   NA  Status of Service:  Completed, signed off  If discussed at [place] of Stay Meetings, dates discussed:    Additional Comments:  Adron Bene, RN 10/12/2016, 9:56 AM

## 2016-10-12 NOTE — Progress Notes (Signed)
PROGRESS NOTE [facility]   Emily Osborne   [id number] DOB: [birth year]  DOA: 10/10/2016 PCP: [name], DO   Brief Narrative:  Emily Osborne is a 56 y.o. female with medical history significant of allergy, asthma, cataracts, elevated blood pressure, fibromyalgia, Grace disease hyperthyroidism, GERD, hyperlipidemia, IBS, herpes zoster was coming to the emergency department with complaints of worsening abdominal pain for the past 2 days. Patient been treated for diverticulitis and was on Augmentin, however for the past 2 days she has an increase in LLQ pain. Patient had CT scan abdomen/pelvis with contrast showing persistent and recurrent acute diverticulitis without perforation or abscess. Patient is admitted for failing outpatient therapy for diverticulitis, to be treated with IV antibiotics.  Subjective: Continues to be febrile, although fever has improved after scheduled tylenol. Pain is still there with very mild improvement. Denies diarrhea, nausea and vomiting. Abd xry done last night did not show any perforation.   Assessment & Plan: Diverticulitis failed outpatient therapy. CT with no evidence of abscess or perforation  Will continue current managament for now, if patient worsen or fever does not improve will repeat CT to r/o abscess formation  Continue IV Zosyn, patient allergic to Flagyl  Follow up blood cultures  Continue clear liquids for now  Tylenol PRN fever  Pain control as needed  Continue IVF   Asthma - stable  Continue home meds   GERD Protonix 40 mg IVPB every 24 hours.  Essential hypertension Per patient, she only gets hypertensive when she has pain or Graves' disease. Continue atenolol 25 mg by mouth daily and monitor blood pressure.  Graves disease Continue methimazole 5 mg by mouth daily. Continue atenolol 25 mg by mouth daily.  Dilated pancreatic duct. Normal lipase - no clinical significance at this point  Will continue  to monitor   DVT prophylaxis: Lovenox  Code Status: FULL Family Communication: Sister at bedside  Disposition Plan: Anticipate discharge back to previous environment when medically stable   Consultants:   None   Procedures:   None   Antimicrobials:  Zosyn 2/21   Objective: Vitals:   10/12/16 0158 10/12/16 0204 10/12/16 0434 10/12/16 0938  BP: (!) 84/60 (!) 94/58 110/78 (!) 109/58  Pulse: 87  85 (!) 101  Resp:   20 18  Temp: (!) 100.9 F (38.3 C)  (!) 100.8 F (38.2 C) (!) 100.5 F (38.1 C)  TempSrc: Oral  Oral Oral  SpO2:   95% 92%  Weight:      Height:        Intake/Output Summary (Last 24 hours) at 10/12/16 1156 Last data filed at 10/12/16 1006  Gross per 24 hour  Intake          2828.33 ml  Output                0 ml  Net          2828.33 ml   Filed Weights   10/10/16 1318 10/10/16 2308 10/11/16 2124  Weight: 106.6 kg (235 lb) 108.4 kg (238 lb 15.7 oz) 108.6 kg (239 lb 6.4 oz)    Examination:  General exam: NAD  Respiratory system: CTA b/l  Cardiovascular system: S1S2 RRR Gastrointestinal system: obese, soft, diffuse tenderness, now more predominant in the LLQ  Central nervous system: AAOx3  Extremities: No edema  Skin: No rash  Psychiatry: Mood appropriate     Data Reviewed: I have personally reviewed following labs and imaging studies  CBC:  Recent  Labs Lab 10/10/16 1350 10/11/16 0639 10/12/16 0620  WBC 11.1* 12.5* 13.8*  NEUTROABS 8.3* 9.5* 11.0*  HGB 12.8 11.4* 11.2*  HCT 37.9 34.7* 34.1*  MCV 85.9 86.5 87.0  PLT 256 204 [id number]   Basic Metabolic Panel:  Recent Labs Lab 10/10/16 1350 10/11/16 0639 10/12/16 0620  NA 139 140 138  K 3.9 3.6 3.4*  CL 104 106 106  CO2 28 25 24   GLUCOSE 92 102* 95  BUN 11 7 <5*  CREATININE 0.96 0.98 0.92  CALCIUM 9.2 8.3* 7.9*   GFR: Estimated Creatinine Clearance: 83.6 mL/min (by C-G formula based on SCr of 0.92 mg/dL). Liver Function Tests:  Recent Labs Lab 10/10/16 1350 10/11/16 0639  10/12/16 0620  AST 25 19 16   ALT 26 22 18   ALKPHOS 61 56 57  BILITOT 0.7 1.2 1.5*  PROT 7.0 5.8* 5.5*  ALBUMIN 3.8 3.0* 2.7*    Recent Labs Lab 10/10/16 1350 10/11/16 0639  LIPASE 20 35   No results for input(s): AMMONIA in the last 168 hours. Coagulation Profile: No results for input(s): INR, PROTIME in the last 168 hours. Cardiac Enzymes: No results for input(s): CKTOTAL, CKMB, CKMBINDEX, TROPONINI in the last 168 hours. BNP (last 3 results) No results for input(s): PROBNP in the last 8760 hours. HbA1C: No results for input(s): HGBA1C in the last 72 hours. CBG: No results for input(s): GLUCAP in the last 168 hours. Lipid Profile: No results for input(s): CHOL, HDL, LDLCALC, TRIG, CHOLHDL, LDLDIRECT in the last 72 hours. Thyroid Function Tests: No results for input(s): TSH, T4TOTAL, FREET4, T3FREE, THYROIDAB in the last 72 hours. Anemia Panel: No results for input(s): VITAMINB12, FOLATE, FERRITIN, TIBC, IRON, RETICCTPCT in the last 72 hours. Sepsis Labs: No results for input(s): PROCALCITON, LATICACIDVEN in the last 168 hours.  Recent Results (from the past 240 hour(s))  Culture, blood (Routine X 2) w Reflex to ID Panel     Status: None (Preliminary result)   Collection Time: 10/11/16 11:27 AM  Result Value Ref Range Status   Specimen Description BLOOD LEFT HAND  Final   Special Requests IN PEDIATRIC BOTTLE 3CC  Final   Culture NO GROWTH < 24 HOURS  Final   Report Status PENDING  Incomplete  Culture, blood (Routine X 2) w Reflex to ID Panel     Status: None (Preliminary result)   Collection Time: 10/11/16 11:30 AM  Result Value Ref Range Status   Specimen Description BLOOD LEFT ASSIST CONTROL  Final   Special Requests BOTTLES DRAWN AEROBIC ONLY 5CC  Final   Culture NO GROWTH < 24 HOURS  Final   Report Status PENDING  Incomplete      Radiology Studies: Ct Abdomen Pelvis W Contrast  Result Date: 10/10/2016 CLINICAL DATA:  Acute onset of left-sided abdominal  pain and pressure. Nausea. Recently treated diverticulitis. Initial encounter. EXAM: CT ABDOMEN AND PELVIS WITH CONTRAST TECHNIQUE: Multidetector CT imaging of the abdomen and pelvis was performed using the standard protocol following bolus administration of intravenous contrast. CONTRAST:  1100mL ISOVUE-300 IOPAMIDOL (ISOVUE-300) INJECTION 61% COMPARISON:  CT of the abdomen and pelvis performed 09/28/2016 FINDINGS: Lower chest: Minimal bibasilar atelectasis is noted. The visualized portions of the mediastinum are unremarkable. Hepatobiliary: The liver is unremarkable in appearance. The gallbladder is unremarkable in appearance. The common bile duct remains normal in caliber. Pancreas: There is dilatation of the pancreatic duct to 5 mm, of uncertain significance. Would correlate with pancreatic lab values. The pancreas is otherwise grossly unremarkable. Spleen: The spleen  is unremarkable in appearance. Adrenals/Urinary Tract: The adrenal glands are unremarkable in appearance. The kidneys are within normal limits. There is no evidence of hydronephrosis. No renal or ureteral stones are identified. No perinephric stranding is seen. Stomach/Bowel: The stomach is unremarkable in appearance. The small bowel is within normal limits. The appendix is normal in caliber, without evidence of appendicitis. Scattered diverticulosis is noted along the entirety of the colon. There is mild focal wall thickening at the mid to distal sigmoid colon, with associated soft tissue inflammation, compatible with persistent or recurrent diverticulitis. No definite perforation or abscess formation is seen. Mild inflammation again tracks superiorly at the left lower quadrant. Trace free fluid is seen within the pelvis. This is similar in appearance to the prior study. Vascular/Lymphatic: The abdominal aorta is unremarkable in appearance. The inferior vena cava is grossly unremarkable. No retroperitoneal lymphadenopathy is seen. No pelvic  sidewall lymphadenopathy is identified. Reproductive: The bladder is mildly distended and grossly unremarkable. The patient is status post hysterectomy. No suspicious adnexal masses are seen. Other: No additional soft tissue abnormalities are seen. Musculoskeletal: No acute osseous abnormalities are identified. The visualized musculature is unremarkable in appearance. IMPRESSION: 1. Persistent or recurrent acute diverticulitis at the mid to distal sigmoid colon, with focal wall thickening and soft tissue inflammation. Trace free fluid within the pelvis. Mild soft tissue inflammation tracks superiorly at the left lower quadrant. No definite evidence of perforation or abscess formation at this time. The appearance is similar to the prior study. 2. Scattered diverticulosis along the entirety of the colon. 3. Dilatation of the pancreatic duct to 5 mm, of uncertain significance. Would correlate with pancreatic lab values; MRCP could be considered for further evaluation as deemed clinically appropriate. Electronically Signed   By: Garald Balding M.D.   On: 10/10/2016 17:34   Dg Abd 2 Views  Result Date: 10/11/2016 CLINICAL DATA:  Abdominal pain and distention. EXAM: ABDOMEN - 2 VIEW COMPARISON:  CT scan from yesterday. FINDINGS: Upright film shows no evidence for intraperitoneal free air. Supine abdomen shows no gaseous small bowel dilatation. Enteric contrast from yesterday's CT scan is now in the nondilated colon. Visualized bony anatomy unremarkable. IMPRESSION: No evidence for intraperitoneal free air or bowel obstruction. Electronically Signed   By: Misty Stanley M.D.   On: 10/11/2016 20:46      Scheduled Meds: . [START ON 10/13/2016] atenolol  12.5 mg Oral Daily  . enoxaparin (LOVENOX) injection  40 mg Subcutaneous Q24H  . fluticasone  2 spray Each Nare Daily  . methimazole  5 mg Oral Daily  . mometasone-formoterol  2 puff Inhalation BID  . pantoprazole (PROTONIX) IV  40 mg Intravenous QHS  .  piperacillin-tazobactam (ZOSYN)  IV  3.375 g Intravenous Q8H  . sodium chloride  500 mL Intravenous Once   Continuous Infusions: . sodium chloride 100 mL/hr at 10/12/16 0241     LOS: 2 days    Chipper Oman, MD Pager: Text Page via [email or web address]  [phone]  If 7PM-7AM, please contact night-coverage [email or web address] Password [facility] 10/12/2016, 11:56 AM

## 2016-10-13 ENCOUNTER — Inpatient Hospital Stay (HOSPITAL_COMMUNITY): Payer: BC Managed Care – PPO

## 2016-10-13 LAB — BASIC METABOLIC PANEL
Anion gap: 9 (ref 5–15)
BUN: 5 mg/dL — AB (ref 6–20)
CHLORIDE: 107 mmol/L (ref 101–111)
CO2: 22 mmol/L (ref 22–32)
Calcium: 8 mg/dL — ABNORMAL LOW (ref 8.9–10.3)
Creatinine, Ser: 0.93 mg/dL (ref 0.44–1.00)
Glucose, Bld: 81 mg/dL (ref 65–99)
Potassium: 3 mmol/L — ABNORMAL LOW (ref 3.5–5.1)
SODIUM: 138 mmol/L (ref 135–145)

## 2016-10-13 LAB — GASTROINTESTINAL PANEL BY PCR, STOOL (REPLACES STOOL CULTURE)

## 2016-10-13 LAB — CBC WITH DIFFERENTIAL/PLATELET
BASOS ABS: 0 10*3/uL (ref 0.0–0.1)
Basophils Relative: 0 %
EOS ABS: 0 10*3/uL (ref 0.0–0.7)
EOS PCT: 0 %
HCT: 32.9 % — ABNORMAL LOW (ref 36.0–46.0)
HEMOGLOBIN: 11.1 g/dL — AB (ref 12.0–15.0)
LYMPHS ABS: 1.3 10*3/uL (ref 0.7–4.0)
LYMPHS PCT: 10 %
MCH: 28.9 pg (ref 26.0–34.0)
MCHC: 33.7 g/dL (ref 30.0–36.0)
MCV: 85.7 fL (ref 78.0–100.0)
Monocytes Absolute: 1.6 10*3/uL — ABNORMAL HIGH (ref 0.1–1.0)
Monocytes Relative: 12 %
NEUTROS PCT: 78 %
Neutro Abs: 10.6 10*3/uL — ABNORMAL HIGH (ref 1.7–7.7)
PLATELETS: 173 10*3/uL (ref 150–400)
RBC: 3.84 MIL/uL — AB (ref 3.87–5.11)
RDW: 14.1 % (ref 11.5–15.5)
WBC: 13.5 10*3/uL — AB (ref 4.0–10.5)

## 2016-10-13 LAB — TROPONIN I
TROPONIN I: 0.07 ng/mL — AB (ref <0.03)
TROPONIN I: 0.03 ng/mL — AB (ref <0.03)
Troponin I: 0.07 ng/mL (ref <0.03)

## 2016-10-13 LAB — LACTIC ACID, PLASMA
LACTIC ACID, VENOUS: 1 mmol/L (ref 0.5–1.9)
Lactic Acid, Venous: 1 mmol/L (ref 0.5–1.9)

## 2016-10-13 MED ORDER — KCL IN DEXTROSE-NACL 20-5-0.45 MEQ/L-%-% IV SOLN
INTRAVENOUS | Status: DC
  Administered 2016-10-13 – 2016-10-15 (×5): via INTRAVENOUS
  Filled 2016-10-13 (×8): qty 1000

## 2016-10-13 MED ORDER — POTASSIUM CHLORIDE CRYS ER 20 MEQ PO TBCR
40.0000 meq | EXTENDED_RELEASE_TABLET | Freq: Two times a day (BID) | ORAL | Status: AC
  Filled 2016-10-13: qty 2

## 2016-10-13 MED ORDER — PROMETHAZINE HCL 25 MG PO TABS
12.5000 mg | ORAL_TABLET | Freq: Four times a day (QID) | ORAL | Status: DC | PRN
  Filled 2016-10-13: qty 1

## 2016-10-13 NOTE — Progress Notes (Signed)
MD notified by text page of critical value Troponin 0.07. Patient stable at this time, denies complaints of chest pain.

## 2016-10-13 NOTE — Progress Notes (Signed)
Received verbal orders for KUB and lactic acid from Md.

## 2016-10-13 NOTE — Progress Notes (Signed)
MD made aware that patient and family has several concerns and questions that they would like MD to address.  MD stated that he would come see patient around 1800.  Patient is aware.

## 2016-10-13 NOTE — Progress Notes (Signed)
Called by primary Rn for 2nd set of eyes.  On my arrival to patients room, RN and family at bedside.  Prior to my arrival MD paged and orders received.  Patient is alert and orietnted, c/o being hot.  Patient states she has 35 BM today.  Skin is hot and dry.  ABd is distended, soft non tender.  No edema noted.  VSS, slightly febrile at 100F.  Patient would like to talk to MD, Md has been made aware by primary RN.  NO RRT interventions.  RN to call if assistance needed

## 2016-10-13 NOTE — Progress Notes (Signed)
Patient temp 101.87F. Acetaminophen administered per order. MD notified.

## 2016-10-13 NOTE — Progress Notes (Signed)
Notified MD of patient's complaint of chest pain. Upon  Assessment patient was noticeably sweating and holding chest. Pulse 95 with regular rhythm. BP 110/72, Resps 18, afebrile. Patient requested 1000 dose of atenolol. MD ordered 1000 dose of atenolol to be given now, stat EKG, and troponin q6 hours x3 draws. Patient states that chest pain is gone and is no longer visibly sweating. MD reviewing EKG results. Will continue to monitor.

## 2016-10-13 NOTE — Progress Notes (Signed)
This RN called Rapid Response due to manual BP result of 98/58 and increased temp of 100, HR 92. Patient complains of being hot. Visibly sweating. Abdomen is more distended than earlier assessment. New IV site placed today, 10/13/2016, is noted to be red following outline of vein and patient complains of burning sensation at site. Current infusion of D5%0.45NS with 20 meq Potassium stopped until further assessment.

## 2016-10-13 NOTE — Progress Notes (Signed)
PROGRESS NOTE [facility]   SHAKURA PONCEDELEON   NWG:[id number] DOB: May 15, 1961  DOA: 10/10/2016 PCP: [name], DO   Brief Narrative:  LILO SEENEY is a 56 y.o. female with multiple medical history including but not limited to hypertension, ASTHMA, hyperthyroidism and fibromyalgia who presented with 2 day history of worsening lower abdominal pain, unresponsive to outpatient Augmentin for treatment of diverticulitis. CT scan abdomen/pelvis with contrast revealed persistent and recurrent acute diverticulitis without perforation or abscess.  Patient is admitted for further management   Subjective: Patient seen and examined, chart reviewed and noted. Abdominal pain better, no fever or chills. Episode of left-sided chest pain lasting for about 5 minutes with twelve-lead EKG at bedside negative for acute ST-T wave changes, resolved at time of re-evaluation.  Assessment & Plan:  #1 Diverticulitis: Failed outpatient therapy.  CT  ab negative forscess or perforation  Continue IV Zosyn, patient allergic to Flagyl  Supportive care Follow  blood cultures. Bowel rest advanced diet as tolerated  #2 Chest Pain:   Transient and resolved  EKG negative for acute changes Cycle troponin. Consider cardiology consult as needed   #3 Hypokalemia:  Repeat potassium  Monitor renal function and electrolytes    #4 Bronchial Asthma:  Stable  Supportive care -bronchodilators  Consider incentive spirometry as needed     DVT prophylaxis: Lovenox Code Status: FULL Family Communication:  Disposition Plan: Anticipate discharge back to previous environment when medically stable   Consultants:   None   Procedures:   None   Antimicrobials:  Zosyn 2/21 and vancomycin 2/23   Objective: Vitals:   10/12/16 2312 10/13/16 0638 10/13/16 0851 10/13/16 0925  BP: 105/60 (!) 98/48 110/72   Pulse: (!) 106 98 95   Resp: 18 16 18    Temp: (!) 101.5 F (38.6 C) (!) 101.3 F (38.5  C) 98.9 F (37.2 C)   TempSrc: Oral Oral Oral   SpO2: 96% 96% 98% 98%  Weight: 109 kg (240 lb 3.2 oz)     Height:        Intake/Output Summary (Last 24 hours) at 10/13/16 1234 Last data filed at 10/13/16 0645  Gross per 24 hour  Intake             1910 ml  Output                1 ml  Net             1909 ml   Filed Weights   10/10/16 2308 10/11/16 2124 10/12/16 2312  Weight: 108.4 kg (238 lb 15.7 oz) 108.6 kg (239 lb 6.4 oz) 109 kg (240 lb 3.2 oz)    Examination:  General exam: No acute distress , comfortable  Respiratory system: Clear to auscultation. No wheezes,crackle or rhonchi Cardiovascular system: S1 & S2 heard, RRR. No JVD, murmurs, rubs or gallops Gastrointestinal system: Soft, nondistended , LLQ diffuse tenderness without rebound tenderness  Central nervous system: Alert and oriented. No focal neurological deficits. Extremities: No pedal edema.  Skin: No rashes, lesions or ulcers Psychiatry: Judgement and insight appear normal. Mood & affect appropriate.    Data Reviewed: I have personally reviewed following labs and imaging studies  CBC:  Recent Labs Lab 10/10/16 1350 10/11/16 0639 10/12/16 0620 10/13/16 0512  WBC 11.1* 12.5* 13.8* 13.5*  NEUTROABS 8.3* 9.5* 11.0* 10.6*  HGB 12.8 11.4* 11.2* 11.1*  HCT 37.9 34.7* 34.1* 32.9*  MCV 85.9 86.5 87.0 85.7  PLT 256 204 187 173  Basic Metabolic Panel:  Recent Labs Lab 10/10/16 1350 10/11/16 0639 10/12/16 0620 10/13/16 0512  NA 139 140 138 138  K 3.9 3.6 3.4* 3.0*  CL 104 106 106 107  CO2 28 25 24 22   GLUCOSE 92 102* 95 81  BUN 11 7 <5* 5*  CREATININE 0.96 0.98 0.92 0.93  CALCIUM 9.2 8.3* 7.9* 8.0*   GFR: Estimated Creatinine Clearance: 83 mL/min (by C-G formula based on SCr of 0.93 mg/dL). Liver Function Tests:  Recent Labs Lab 10/10/16 1350 10/11/16 0639 10/12/16 0620  AST 25 19 16   ALT 26 22 18   ALKPHOS 61 56 57  BILITOT 0.7 1.2 1.5*  PROT 7.0 5.8* 5.5*  ALBUMIN 3.8 3.0* 2.7*     Recent Labs Lab 10/10/16 1350 10/11/16 0639  LIPASE 20 35   No results for input(s): AMMONIA in the last 168 hours. Coagulation Profile: No results for input(s): INR, PROTIME in the last 168 hours. Cardiac Enzymes:  Recent Labs Lab 10/13/16 0932  TROPONINI 0.07*   BNP (last 3 results) No results for input(s): PROBNP in the last 8760 hours. HbA1C: No results for input(s): HGBA1C in the last 72 hours. CBG: No results for input(s): GLUCAP in the last 168 hours. Lipid Profile: No results for input(s): CHOL, HDL, LDLCALC, TRIG, CHOLHDL, LDLDIRECT in the last 72 hours. Thyroid Function Tests: No results for input(s): TSH, T4TOTAL, FREET4, T3FREE, THYROIDAB in the last 72 hours. Anemia Panel: No results for input(s): VITAMINB12, FOLATE, FERRITIN, TIBC, IRON, RETICCTPCT in the last 72 hours. Sepsis Labs: No results for input(s): PROCALCITON, LATICACIDVEN in the last 168 hours.  Recent Results (from the past 240 hour(s))  Culture, blood (Routine X 2) w Reflex to ID Panel     Status: None (Preliminary result)   Collection Time: 10/11/16 11:27 AM  Result Value Ref Range Status   Specimen Description BLOOD LEFT HAND  Final   Special Requests IN PEDIATRIC BOTTLE 3CC  Final   Culture NO GROWTH 2 DAYS  Final   Report Status PENDING  Incomplete  Culture, blood (Routine X 2) w Reflex to ID Panel     Status: None (Preliminary result)   Collection Time: 10/11/16 11:30 AM  Result Value Ref Range Status   Specimen Description BLOOD LEFT ASSIST CONTROL  Final   Special Requests BOTTLES DRAWN AEROBIC ONLY 5CC  Final   Culture NO GROWTH 2 DAYS  Final   Report Status PENDING  Incomplete  C difficile quick scan w PCR reflex     Status: Abnormal   Collection Time: 10/12/16 12:34 PM  Result Value Ref Range Status   C Diff antigen POSITIVE (A) NEGATIVE Final   C Diff toxin POSITIVE (A) NEGATIVE Final   C Diff interpretation Toxin producing C. difficile detected.  Final    Comment:  CRITICAL RESULT CALLED TO, READ BACK BY AND VERIFIED WITH: Lind Guest, RN, 10/12/16 AT 1855 BY Mickel Fuchs       Radiology Studies: Dg Abd 2 Views  Result Date: 10/11/2016 CLINICAL DATA:  Abdominal pain and distention. EXAM: ABDOMEN - 2 VIEW COMPARISON:  CT scan from yesterday. FINDINGS: Upright film shows no evidence for intraperitoneal free air. Supine abdomen shows no gaseous small bowel dilatation. Enteric contrast from yesterday's CT scan is now in the nondilated colon. Visualized bony anatomy unremarkable. IMPRESSION: No evidence for intraperitoneal free air or bowel obstruction. Electronically Signed   By: Kennith Center M.D.   On: 10/11/2016 20:46      Scheduled Meds: .  atenolol  12.5 mg Oral Daily  . enoxaparin (LOVENOX) injection  40 mg Subcutaneous Q24H  . fluticasone  2 spray Each Nare Daily  . methimazole  5 mg Oral Daily  . mometasone-formoterol  2 puff Inhalation BID  . pantoprazole (PROTONIX) IV  40 mg Intravenous QHS  . piperacillin-tazobactam (ZOSYN)  IV  3.375 g Intravenous Q8H  . sodium chloride  500 mL Intravenous Once  . vancomycin  125 mg Oral QID   Continuous Infusions: . sodium chloride 100 mL/hr at 10/12/16 2356     LOS: 3 days    OSEI-BONSU,Tonyia Marschall, MD Pager: Text Page via [email or web address]  [phone]  If 7PM-7AM, please contact night-coverage [email or web address] Password [facility] 10/13/2016, 12:34 PM

## 2016-10-14 ENCOUNTER — Inpatient Hospital Stay (HOSPITAL_COMMUNITY): Payer: BC Managed Care – PPO

## 2016-10-14 LAB — COMPREHENSIVE METABOLIC PANEL
ALBUMIN: 2.7 g/dL — AB (ref 3.5–5.0)
ALT: 21 U/L (ref 14–54)
AST: 25 U/L (ref 15–41)
Alkaline Phosphatase: 50 U/L (ref 38–126)
Anion gap: 5 (ref 5–15)
BILIRUBIN TOTAL: 0.5 mg/dL (ref 0.3–1.2)
BUN: <5 mg/dL — ABNORMAL LOW (ref 6–20)
CO2: 25 mmol/L (ref 22–32)
CREATININE: 0.81 mg/dL (ref 0.44–1.00)
Calcium: 8.2 mg/dL — ABNORMAL LOW (ref 8.9–10.3)
Chloride: 110 mmol/L (ref 101–111)
GFR calc Af Amer: >60 mL/min (ref >60)
GFR calc non Af Amer: >60 mL/min (ref >60)
GLUCOSE: 105 mg/dL — AB (ref 65–99)
POTASSIUM: 3.1 mmol/L — AB (ref 3.5–5.1)
Sodium: 140 mmol/L (ref 135–145)
TOTAL PROTEIN: 5.6 g/dL — AB (ref 6.5–8.1)

## 2016-10-14 LAB — CBC WITH DIFFERENTIAL/PLATELET
Basophils Absolute: 0 10*3/uL (ref 0.0–0.1)
Basophils Relative: 0 %
EOS PCT: 1 %
Eosinophils Absolute: 0.1 10*3/uL (ref 0.0–0.7)
HCT: 31.3 % — ABNORMAL LOW (ref 36.0–46.0)
Hemoglobin: 10.6 g/dL — ABNORMAL LOW (ref 12.0–15.0)
LYMPHS ABS: 1.2 10*3/uL (ref 0.7–4.0)
LYMPHS PCT: 13 %
MCH: 28.4 pg (ref 26.0–34.0)
MCHC: 33.9 g/dL (ref 30.0–36.0)
MCV: 83.9 fL (ref 78.0–100.0)
MONO ABS: 0.7 10*3/uL (ref 0.1–1.0)
Monocytes Relative: 8 %
Neutro Abs: 7.6 10*3/uL (ref 1.7–7.7)
Neutrophils Relative %: 78 %
PLATELETS: 187 10*3/uL (ref 150–400)
RBC: 3.73 MIL/uL — ABNORMAL LOW (ref 3.87–5.11)
RDW: 13.9 % (ref 11.5–15.5)
WBC: 9.8 10*3/uL (ref 4.0–10.5)

## 2016-10-14 MED ORDER — VANCOMYCIN 50 MG/ML ORAL SOLUTION
250.0000 mg | Freq: Four times a day (QID) | ORAL | Status: DC
  Administered 2016-10-14 – 2016-10-16 (×10): 250 mg via ORAL
  Filled 2016-10-14 (×11): qty 5

## 2016-10-14 MED ORDER — SODIUM CHLORIDE 0.9% FLUSH
10.0000 mL | INTRAVENOUS | Status: DC | PRN
  Administered 2016-10-14 – 2016-10-16 (×3): 10 mL
  Filled 2016-10-14 (×3): qty 40

## 2016-10-14 NOTE — Progress Notes (Signed)
PROGRESS NOTE [facility]   Emily Osborne   ZOX:[id number] DOB: [birth year]  DOA: 10/10/2016 PCP: [name], DO   Brief Narrative:  Emily Osborne is a 56 y.o. female with multiple medical history including but not limited to hypertension, ASTHMA, hyperthyroidism and fibromyalgia who presented with 2 day history of worsening lower abdominal pain, unresponsive to outpatient Augmentin for treatment of diverticulitis. CT scan abdomen/pelvis with contrast revealed persistent and recurrent acute diverticulitis without perforation or abscess.  Patient is admitted for further management   Subjective: Patient seen and examined, chart reviewed and noted. No abd distension and Abd x-ray from last night unrtemarkable, no distension on exam this morning.  Problems with IV access and has has not had adequate fluids/ K - agrees to PICC this morning- had declined yesterday.  Assessment & Plan:  #1 Diverticulitis: Failed outpatient therapy.  CT  ab negative for abscess or perforation  Continue IV Zosyn, patient allergic to Flagyl  Supportive care Follow  blood cultures. Bowel rest advanced diet as tolerated  #2 C-Diff Colitis; Cont oral vanco- cholestyramine would affect absorption of vanco  #3 Chest Pain:   Transient and resolved  EKG negative for acute changes Neg troponin. 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 Asthma:  Stable  Supportive care -bronchodilators  Consider incentive spirometry as needed     DVT prophylaxis: Lovenox Code Status: FULL Family Communication:  Disposition Plan: Anticipate discharge back to previous environment when medically stable   Consultants:   None   Procedures:   None   Antimicrobials:  Zosyn 2/21 and vancomycin 2/23   Objective: Vitals:   10/13/16 1736 10/13/16 2057 10/13/16 2144 10/14/16 0614  BP:   118/69 114/68    Pulse: 93  88 87  Resp:   20 20  Temp: 100 F (37.8 C)  99 F (37.2 C) 98.9 F (37.2 C)  TempSrc: Oral     SpO2:  98% 97% 99%  Weight:   107.5 kg (237 lb)   Height:        Intake/Output Summary (Last 24 hours) at 10/14/16 0818 Last data filed at 10/14/16 0600  Gross per 24 hour  Intake          2923.33 ml  Output                0 ml  Net          2923.33 ml   Filed Weights   10/11/16 2124 10/12/16 2312 10/13/16 2144  Weight: 108.6 kg (239 lb 6.4 oz) 109 kg (240 lb 3.2 oz) 107.5 kg (237 lb)    Examination:  General exam: No acute distress , comfortable  Respiratory system: Clear to auscultation. No wheezes,crackle or rhonchi Cardiovascular system: S1 & S2 heard, RRR. No JVD, murmurs, rubs or gallops Gastrointestinal system: Soft, nondistended , mild LLQ  tenderness without rebound tenderness  Central nervous system: Alert and oriented. No focal neurological deficits. Extremities: No pedal edema.  Skin: No rashes, lesions or ulcers Psychiatry: Judgement and insight appear normal. Mood & affect appropriate.    Data Reviewed: I have personally reviewed following labs and imaging studies  CBC:  Recent Labs Lab 10/10/16 1350 10/11/16 0639 10/12/16 0620 10/13/16 0512  WBC 11.1* 12.5* 13.8* 13.5*  NEUTROABS 8.3* 9.5* 11.0* 10.6*  HGB 12.8 11.4* 11.2* 11.1*  HCT 37.9 34.7* 34.1* 32.9*  MCV 85.9 86.5 87.0  85.7  PLT 256 204 187 173   Basic Metabolic Panel:  Recent Labs Lab 10/10/16 1350 10/11/16 0639 10/12/16 0620 10/13/16 0512  NA 139 140 138 138  K 3.9 3.6 3.4* 3.0*  CL 104 106 106 107  CO2 28 25 24 22   GLUCOSE 92 102* 95 81  BUN 11 7 <5* 5*  CREATININE 0.96 0.98 0.92 0.93  CALCIUM 9.2 8.3* 7.9* 8.0*   GFR: Estimated Creatinine Clearance: 82.3 mL/min (by C-G formula based on SCr of 0.93 mg/dL). Liver Function Tests:  Recent Labs Lab 10/10/16 1350 10/11/16 0639 10/12/16 0620  AST 25 19 16   ALT 26 22 18   ALKPHOS 61 56 57  BILITOT 0.7 1.2 1.5*   PROT 7.0 5.8* 5.5*  ALBUMIN 3.8 3.0* 2.7*    Recent Labs Lab 10/10/16 1350 10/11/16 0639  LIPASE 20 35   No results for input(s): AMMONIA in the last 168 hours. Coagulation Profile: No results for input(s): INR, PROTIME in the last 168 hours. Cardiac Enzymes:  Recent Labs Lab 10/13/16 0932 10/13/16 1607 10/13/16 2046  TROPONINI 0.07* 0.07* 0.03*   BNP (last 3 results) No results for input(s): PROBNP in the last 8760 hours. HbA1C: No results for input(s): HGBA1C in the last 72 hours. CBG: No results for input(s): GLUCAP in the last 168 hours. Lipid Profile: No results for input(s): CHOL, HDL, LDLCALC, TRIG, CHOLHDL, LDLDIRECT in the last 72 hours. Thyroid Function Tests: No results for input(s): TSH, T4TOTAL, FREET4, T3FREE, THYROIDAB in the last 72 hours. Anemia Panel: No results for input(s): VITAMINB12, FOLATE, FERRITIN, TIBC, IRON, RETICCTPCT in the last 72 hours. Sepsis Labs:  Recent Labs Lab 10/13/16 1827 10/13/16 2047  LATICACIDVEN 1.0 1.0    Recent Results (from the past 240 hour(s))  Culture, blood (Routine X 2) w Reflex to ID Panel     Status: None (Preliminary result)   Collection Time: 10/11/16 11:27 AM  Result Value Ref Range Status   Specimen Description BLOOD LEFT HAND  Final   Special Requests IN PEDIATRIC BOTTLE 3CC  Final   Culture NO GROWTH 2 DAYS  Final   Report Status PENDING  Incomplete  Culture, blood (Routine X 2) w Reflex to ID Panel     Status: None (Preliminary result)   Collection Time: 10/11/16 11:30 AM  Result Value Ref Range Status   Specimen Description BLOOD LEFT ASSIST CONTROL  Final   Special Requests BOTTLES DRAWN AEROBIC ONLY 5CC  Final   Culture NO GROWTH 2 DAYS  Final   Report Status PENDING  Incomplete  C difficile quick scan w PCR reflex     Status: Abnormal   Collection Time: 10/12/16 12:34 PM  Result Value Ref Range Status   C Diff antigen POSITIVE (A) NEGATIVE Final   C Diff toxin POSITIVE (A) NEGATIVE Final    C Diff interpretation Toxin producing C. difficile detected.  Final    Comment: CRITICAL RESULT CALLED TO, READ BACK BY AND VERIFIED WITH: Lind Guest, RN, 10/12/16 AT 1855 BY J FUDESCO   Gastrointestinal Panel by PCR , Stool     Status: None   Collection Time: 10/12/16 12:34 PM  Result Value Ref Range Status   Campylobacter species NOT DETECTED NOT DETECTED Final   Plesimonas shigelloides NOT DETECTED NOT DETECTED Final   Salmonella species NOT DETECTED NOT DETECTED Final   Yersinia enterocolitica NOT DETECTED NOT DETECTED Final   Vibrio species NOT DETECTED NOT DETECTED Final   Vibrio cholerae NOT DETECTED NOT DETECTED Final  Enteroaggregative E coli (EAEC) NOT DETECTED NOT DETECTED Final   Enteropathogenic E coli (EPEC) NOT DETECTED NOT DETECTED Final   Enterotoxigenic E coli (ETEC) NOT DETECTED NOT DETECTED Final   Shiga like toxin producing E coli (STEC) NOT DETECTED NOT DETECTED Final   Shigella/Enteroinvasive E coli (EIEC) NOT DETECTED NOT DETECTED Final   Cryptosporidium NOT DETECTED NOT DETECTED Final   Cyclospora cayetanensis NOT DETECTED NOT DETECTED Final   Entamoeba histolytica NOT DETECTED NOT DETECTED Final   Giardia lamblia NOT DETECTED NOT DETECTED Final   Adenovirus F40/41 NOT DETECTED NOT DETECTED Final   Astrovirus NOT DETECTED NOT DETECTED Final   Norovirus GI/GII NOT DETECTED NOT DETECTED Final   Rotavirus A NOT DETECTED NOT DETECTED Final   Sapovirus (I, II, IV, and V) NOT DETECTED NOT DETECTED Final      Radiology Studies: Dg Abd Portable 1v  Result Date: 10/13/2016 CLINICAL DATA:  Abdomen distension EXAM: PORTABLE ABDOMEN - 1 VIEW COMPARISON:  10/11/2016 FINDINGS: Nonobstructed bowel-gas pattern. Clearing of contrast from the colon. Calcified pelvic phleboliths. IMPRESSION: Nonobstructed bowel-gas pattern with clearing of contrast from the colon Electronically Signed   By: Jasmine Pang M.D.   On: 10/13/2016 21:41      Scheduled Meds: . atenolol  12.5 mg  Oral Daily  . enoxaparin (LOVENOX) injection  40 mg Subcutaneous Q24H  . fluticasone  2 spray Each Nare Daily  . methimazole  5 mg Oral Daily  . mometasone-formoterol  2 puff Inhalation BID  . pantoprazole (PROTONIX) IV  40 mg Intravenous QHS  . piperacillin-tazobactam (ZOSYN)  IV  3.375 g Intravenous Q8H  . potassium chloride  40 mEq Oral BID  . sodium chloride  500 mL Intravenous Once  . vancomycin  125 mg Oral QID   Continuous Infusions: . dextrose 5 % and 0.45 % NaCl with KCl 20 mEq/L 100 mL/hr at 10/14/16 0604     LOS: 4 days    [email or web address],Neill Jurewicz, MD Pager: Text Page via [email or web address]  [phone]  If 7PM-7AM, please contact night-coverage [email or web address] Password [facility] 10/14/2016, 8:18 AM

## 2016-10-14 NOTE — Progress Notes (Addendum)
Peripherally Inserted Central Catheter/Midline Placement  The IV Nurse has discussed with the patient and/or persons authorized to consent for the patient, the purpose of this procedure and the potential benefits and risks involved with this procedure.  The benefits include less needle sticks, lab draws from the catheter, and the patient may be discharged home with the catheter. Risks include, but not limited to, infection, bleeding, blood clot (thrombus formation), and puncture of an artery; nerve damage and irregular heartbeat and possibility to perform a PICC exchange if needed/ordered by physician.  Alternatives to this procedure were also discussed.  Bard Power PICC patient education guide, fact sheet on infection prevention and patient information card has been provided to patient /or left at bedside.   Fever present prior to insertion with negative BC noted.  Pt aware as well of current fever, unrelated to PICC.  PICC/Midline Placement Documentation  PICC Double Lumen [id number] PICC Right Basilic 42 cm 2 cm (Active)  Indication for Insertion or Continuance of Line Prolonged intravenous therapies;Limited venous access - need for IV therapy >5 days (PICC only) 10/14/2016  1:13 PM  Exposed Catheter (cm) 2 cm 10/14/2016  1:13 PM  Site Assessment Clean;Dry;Intact 10/14/2016  1:13 PM  Lumen #1 Status Flushed;Saline locked;Blood return noted 10/14/2016  1:13 PM  Lumen #2 Status Flushed;Saline locked;Blood return noted 10/14/2016  1:13 PM  Dressing Type Transparent 10/14/2016  1:13 PM  Dressing Status Clean;Dry;Intact;Antimicrobial disc in place 10/14/2016  1:13 PM  Line Care Connections checked and tightened 10/14/2016  1:13 PM  Line Adjustment (NICU/IV Team Only) No 10/14/2016  1:13 PM  Dressing Intervention New dressing 10/14/2016  1:13 PM  Dressing Change Due 10/21/16 10/14/2016  1:13 PM       Rolena Infante 10/14/2016, 1:14 PM

## 2016-10-15 ENCOUNTER — Encounter: Payer: Self-pay | Admitting: Family Medicine

## 2016-10-15 DIAGNOSIS — A0472 Enterocolitis due to Clostridium difficile, not specified as recurrent: Secondary | ICD-10-CM

## 2016-10-15 DIAGNOSIS — K219 Gastro-esophageal reflux disease without esophagitis: Secondary | ICD-10-CM

## 2016-10-15 LAB — BASIC METABOLIC PANEL
Anion gap: 8 (ref 5–15)
CALCIUM: 8.4 mg/dL — AB (ref 8.9–10.3)
CO2: 25 mmol/L (ref 22–32)
Chloride: 109 mmol/L (ref 101–111)
Creatinine, Ser: 0.73 mg/dL (ref 0.44–1.00)
GFR calc Af Amer: >60 mL/min (ref >60)
GLUCOSE: 119 mg/dL — AB (ref 65–99)
Potassium: 3.3 mmol/L — ABNORMAL LOW (ref 3.5–5.1)
Sodium: 142 mmol/L (ref 135–145)

## 2016-10-15 MED ORDER — PROMETHAZINE HCL 25 MG/ML IJ SOLN
12.5000 mg | Freq: Four times a day (QID) | INTRAMUSCULAR | Status: DC | PRN
  Administered 2016-10-15 (×3): 12.5 mg via INTRAVENOUS
  Filled 2016-10-15 (×3): qty 1

## 2016-10-15 MED ORDER — ENOXAPARIN SODIUM 60 MG/0.6ML ~~LOC~~ SOLN: 60.0000 mg | SUBCUTANEOUS | Status: DC

## 2016-10-15 MED ORDER — SODIUM CHLORIDE 0.9 % IV SOLN
3.0000 g | Freq: Four times a day (QID) | INTRAVENOUS | Status: DC
  Administered 2016-10-15 – 2016-10-16 (×4): 3 g via INTRAVENOUS
  Filled 2016-10-15 (×6): qty 3

## 2016-10-15 NOTE — Progress Notes (Signed)
Pharmacy Antibiotic Note  Emily Osborne is a 56 y.o. female admitted on 10/10/2016 with diverticulitis.  Pharmacy has been consulted for Unasyn dosing.  Narrowing therapy from Zosyn with pt improving and CT(-) for abscess/perforation.  CrCl > 38ml/min  Per d/w DrSilva, will d/c Protonix in setting of (+)CDiff and change Lovenox to 0.5mg /kg SQ q24 with BMI > 30  Plan: Unasyn 3g IV q6 Pharmacy will sign off as renal function is stable  Height: 5\' 5"  (165.1 cm) Weight: 263 lb 11.2 oz (119.6 kg) IBW/kg (Calculated) : 57  Temp (24hrs), Avg:98.4 F (36.9 C), Min:98 F (36.7 C), Max:98.6 F (37 C)   Recent Labs Lab 10/10/16 1350 10/11/16 0639 10/12/16 0620 10/13/16 0512 10/13/16 1827 10/13/16 2047 10/14/16 0738  WBC 11.1* 12.5* 13.8* 13.5*  --   --  9.8  CREATININE 0.96 0.98 0.92 0.93  --   --  0.81  LATICACIDVEN  --   --   --   --  1.0 1.0  --     Estimated Creatinine Clearance: 100.4 mL/min (by C-G formula based on SCr of 0.81 mg/dL).    Allergies  Allergen Reactions  . Aspirin Other (See Comments)    REACTION: anaphylaxis  . Gentamicin Other (See Comments)    Eye drops turned the sclera bright red  . Ibuprofen Other (See Comments)    REACTION: anaphylaxsis  . Nsaids Other (See Comments)    REACTION: anaphylaxis  . Doxycycline Other (See Comments)    REACTION: severe nausea/vomiting  . Influenza A (H1n1) Monoval Vac Other (See Comments)    REACTION: sick for 3 weeks  . Loratadine Other (See Comments)    Fatigue/weakness  . Metronidazole Other (See Comments)    REACTION: red face/swelling  . Ranitidine Hcl Other (See Comments)    REACTION: Lips turned red /peel  . Sulfamethoxazole-Trimethoprim Other (See Comments)    REACTION: face red/peel  . Tramadol Hcl Other (See Comments)    REACTION: paranoid    Antimicrobials this admission: Zosyn 2/22 >> 2/26 VancPO 2/25 >> Unasyn 2/26 >>  Microbiology results: 2/22 blood ntd 2/23  CDiff tox/Ag (+)  Thank  you for allowing pharmacy to be a part of this patient's care.   Gracy Bruins, PharmD Clinical Pharmacist [facility]

## 2016-10-15 NOTE — Progress Notes (Addendum)
PROGRESS NOTE [facility]   Emily Osborne   [id number] DOB: [birth year]  DOA: 10/10/2016 PCP: [name], DO   Brief Narrative:  Emily Osborne is a 56 y.o. female with medical history significant of allergy, asthma, cataracts, elevated blood pressure, fibromyalgia, Grace disease hyperthyroidism, GERD, hyperlipidemia, IBS, herpes zoster was coming to the emergency department with complaints of worsening abdominal pain for the past 2 days. Patient been treated for diverticulitis and was on Augmentin, however for the past 2 days she has an increase in LLQ pain. Patient had CT scan abdomen/pelvis with contrast showing persistent and recurrent acute diverticulitis without perforation or abscess. Patient is admitted for failing outpatient therapy for diverticulitis, to be treated with IV antibiotics. Found to be C diff positive started on oral Vanco.   Subjective: Patient feeling well, having less diarrhea. Now feeling hungry. Abdominal pain improved.   Assessment & Plan: Diverticulitis exacerbated by C diff - failed outpatient therapy. CT with no evidence of abscess or perforation - Improving  Continue IV Zosyn - switch to Unasyn, patient allergic to Flagyl  Blood cultures - no growth up to date  Tylenol PRN  Pain control as needed  Supportive care   C diff Colitis  Continue oral vanco - Advance diet as tolerated   Asthma - stable  Continue home meds   Chest pain w mild elevation of troponin - likely demand ischemia due to infection  Pain has resolved  No further cardiac work up at this time patient had a recent cardiac cath   GERD Patient on Protonix - will d/c given c diff infection  Monitor for now   Essential hypertension Per patient, she only gets hypertensive when she has pain or Graves' disease. Continue atenolol 25 mg by mouth daily and monitor blood pressure.  Graves disease Continue methimazole 5 mg by mouth daily. Continue atenolol 25 mg by  mouth daily.  Dilated pancreatic duct. Normal lipase - no clinical significance at this point  Will continue to monitor   DVT prophylaxis: Lovenox  Code Status: FULL Family Communication: Daughter at bedside  Disposition Plan: Anticipate discharge back to previous environment probable in 24 hr  Consultants:   None   Procedures:   None   Antimicrobials:  Zosyn 2/21   Objective: Vitals:   10/14/16 2007 10/14/16 2155 10/15/16 0935 10/15/16 0945  BP:  119/76  119/80  Pulse:  80  69  Resp:  16  18  Temp:  98.6 F (37 C)  98 F (36.7 C)  TempSrc:  Oral    SpO2: 97% 96% 98% 99%  Weight:  119.6 kg (263 lb 11.2 oz)    Height:        Intake/Output Summary (Last 24 hours) at 10/15/16 1238 Last data filed at 10/15/16 1000  Gross per 24 hour  Intake             3630 ml  Output                0 ml  Net             3630 ml   Filed Weights   10/12/16 2312 10/13/16 2144 10/14/16 2155  Weight: 109 kg (240 lb 3.2 oz) 107.5 kg (237 lb) 119.6 kg (263 lb 11.2 oz)    Examination:  General exam: Sitting up in chair  Respiratory system: clear  Cardiovascular system: S1S2 no murmurs  Gastrointestinal system: Soft Obese NTND, + BS  Central nervous system:  Non focal  Extremities: No LE edema  Skin: No lesions  Psychiatry: Mood appropriate     Data Reviewed: I have personally reviewed following labs and imaging studies  CBC:  Recent Labs Lab 10/10/16 1350 10/11/16 0639 10/12/16 0620 10/13/16 0512 10/14/16 0738  WBC 11.1* 12.5* 13.8* 13.5* 9.8  NEUTROABS 8.3* 9.5* 11.0* 10.6* 7.6  HGB 12.8 11.4* 11.2* 11.1* 10.6*  HCT 37.9 34.7* 34.1* 32.9* 31.3*  MCV 85.9 86.5 87.0 85.7 83.9  PLT 256 204 187 173 [id number]   Basic Metabolic Panel:  Recent Labs Lab 10/10/16 1350 10/11/16 0639 10/12/16 0620 10/13/16 0512 10/14/16 0738  NA 139 140 138 138 140  K 3.9 3.6 3.4* 3.0* 3.1*  CL 104 106 106 107 110  CO2 28 25 24 22 25   GLUCOSE 92 102* 95 81 105*  BUN 11 7 <5* 5* <5*    CREATININE 0.96 0.98 0.92 0.93 0.81  CALCIUM 9.2 8.3* 7.9* 8.0* 8.2*   GFR: Estimated Creatinine Clearance: 100.4 mL/min (by C-G formula based on SCr of 0.81 mg/dL). Liver Function Tests:  Recent Labs Lab 10/10/16 1350 10/11/16 0639 10/12/16 0620 10/14/16 0738  AST 25 19 16 25   ALT 26 22 18 21   ALKPHOS 61 56 57 50  BILITOT 0.7 1.2 1.5* 0.5  PROT 7.0 5.8* 5.5* 5.6*  ALBUMIN 3.8 3.0* 2.7* 2.7*    Recent Labs Lab 10/10/16 1350 10/11/16 0639  LIPASE 20 35   No results for input(s): AMMONIA in the last 168 hours. Coagulation Profile: No results for input(s): INR, PROTIME in the last 168 hours. Cardiac Enzymes:  Recent Labs Lab 10/13/16 0932 10/13/16 1607 10/13/16 2046  TROPONINI 0.07* 0.07* 0.03*   BNP (last 3 results) No results for input(s): PROBNP in the last 8760 hours. HbA1C: No results for input(s): HGBA1C in the last 72 hours. CBG: No results for input(s): GLUCAP in the last 168 hours. Lipid Profile: No results for input(s): CHOL, HDL, LDLCALC, TRIG, CHOLHDL, LDLDIRECT in the last 72 hours. Thyroid Function Tests: No results for input(s): TSH, T4TOTAL, FREET4, T3FREE, THYROIDAB in the last 72 hours. Anemia Panel: No results for input(s): VITAMINB12, FOLATE, FERRITIN, TIBC, IRON, RETICCTPCT in the last 72 hours. Sepsis Labs:  Recent Labs Lab 10/13/16 1827 10/13/16 2047  LATICACIDVEN 1.0 1.0    Recent Results (from the past 240 hour(s))  Culture, blood (Routine X 2) w Reflex to ID Panel     Status: None (Preliminary result)   Collection Time: 10/11/16 11:27 AM  Result Value Ref Range Status   Specimen Description BLOOD LEFT HAND  Final   Special Requests IN PEDIATRIC BOTTLE 3CC  Final   Culture NO GROWTH 3 DAYS  Final   Report Status PENDING  Incomplete  Culture, blood (Routine X 2) w Reflex to ID Panel     Status: None (Preliminary result)   Collection Time: 10/11/16 11:30 AM  Result Value Ref Range Status   Specimen Description BLOOD LEFT  ASSIST CONTROL  Final   Special Requests BOTTLES DRAWN AEROBIC ONLY 5CC  Final   Culture NO GROWTH 3 DAYS  Final   Report Status PENDING  Incomplete  C difficile quick scan w PCR reflex     Status: Abnormal   Collection Time: 10/12/16 12:34 PM  Result Value Ref Range Status   C Diff antigen POSITIVE (A) NEGATIVE Final   C Diff toxin POSITIVE (A) NEGATIVE Final   C Diff interpretation Toxin producing C. difficile detected.  Final    Comment:  CRITICAL RESULT CALLED TO, READ BACK BY AND VERIFIED WITH: Chapman Moss, RN, 10/12/16 AT 1855 BY J FUDESCO   Gastrointestinal Panel by PCR , Stool     Status: None   Collection Time: 10/12/16 12:34 PM  Result Value Ref Range Status   Campylobacter species NOT DETECTED NOT DETECTED Final   Plesimonas shigelloides NOT DETECTED NOT DETECTED Final   Salmonella species NOT DETECTED NOT DETECTED Final   Yersinia enterocolitica NOT DETECTED NOT DETECTED Final   Vibrio species NOT DETECTED NOT DETECTED Final   Vibrio cholerae NOT DETECTED NOT DETECTED Final   Enteroaggregative E coli (EAEC) NOT DETECTED NOT DETECTED Final   Enteropathogenic E coli (EPEC) NOT DETECTED NOT DETECTED Final   Enterotoxigenic E coli (ETEC) NOT DETECTED NOT DETECTED Final   Shiga like toxin producing E coli (STEC) NOT DETECTED NOT DETECTED Final   Shigella/Enteroinvasive E coli (EIEC) NOT DETECTED NOT DETECTED Final   Cryptosporidium NOT DETECTED NOT DETECTED Final   Cyclospora cayetanensis NOT DETECTED NOT DETECTED Final   Entamoeba histolytica NOT DETECTED NOT DETECTED Final   Giardia lamblia NOT DETECTED NOT DETECTED Final   Adenovirus F40/41 NOT DETECTED NOT DETECTED Final   Astrovirus NOT DETECTED NOT DETECTED Final   Norovirus GI/GII NOT DETECTED NOT DETECTED Final   Rotavirus A NOT DETECTED NOT DETECTED Final   Sapovirus (I, II, IV, and V) NOT DETECTED NOT DETECTED Final     Radiology Studies: Dg Chest Port 1 View  Result Date: 10/14/2016 CLINICAL DATA:  Post insertion  to confirm line placement. Hx of asthma, left heart cath and coronary angiography, hysterectomy. Nonsmoker. EXAM: PORTABLE CHEST 1 VIEW COMPARISON:  10/10/2009 FINDINGS: There is no central line or other device visible on the included field of view. Cardiac silhouette is normal in size. No mediastinal or hilar masses. No evidence of adenopathy. Clear lungs. No pleural effusion.  No pneumothorax. Skeletal structures are unremarkable. IMPRESSION: 1. No active disease of the chest. 2. No evidence of a central line or other device on the included field of view. Electronically Signed   By: Lajean Manes M.D.   On: 10/14/2016 11:13   Dg Abd Portable 1v  Result Date: 10/13/2016 CLINICAL DATA:  Abdomen distension EXAM: PORTABLE ABDOMEN - 1 VIEW COMPARISON:  10/11/2016 FINDINGS: Nonobstructed bowel-gas pattern. Clearing of contrast from the colon. Calcified pelvic phleboliths. IMPRESSION: Nonobstructed bowel-gas pattern with clearing of contrast from the colon Electronically Signed   By: Donavan Foil M.D.   On: 10/13/2016 21:41     Scheduled Meds: . atenolol  12.5 mg Oral Daily  . enoxaparin (LOVENOX) injection  40 mg Subcutaneous Q24H  . fluticasone  2 spray Each Nare Daily  . methimazole  5 mg Oral Daily  . mometasone-formoterol  2 puff Inhalation BID  . piperacillin-tazobactam (ZOSYN)  IV  3.375 g Intravenous Q8H  . sodium chloride  500 mL Intravenous Once  . vancomycin  250 mg Oral QID   Continuous Infusions: . dextrose 5 % and 0.45 % NaCl with KCl 20 mEq/L 100 mL/hr at 10/15/16 1028     LOS: 5 days    Chipper Oman, MD Pager: Text Page via [email or web address]  [phone]  If 7PM-7AM, please contact night-coverage [email or web address] Password [facility] 10/15/2016, 12:38 PM

## 2016-10-16 LAB — BASIC METABOLIC PANEL
ANION GAP: 4 — AB (ref 5–15)
CO2: 26 mmol/L (ref 22–32)
Calcium: 8.4 mg/dL — ABNORMAL LOW (ref 8.9–10.3)
Chloride: 111 mmol/L (ref 101–111)
Creatinine, Ser: 0.73 mg/dL (ref 0.44–1.00)
GFR calc Af Amer: >60 mL/min (ref >60)
Glucose, Bld: 98 mg/dL (ref 65–99)
POTASSIUM: 3.6 mmol/L (ref 3.5–5.1)
SODIUM: 141 mmol/L (ref 135–145)

## 2016-10-16 LAB — CULTURE, BLOOD (ROUTINE X 2)
CULTURE: NO GROWTH
Culture: NO GROWTH

## 2016-10-16 MED ORDER — VANCOMYCIN 50 MG/ML ORAL SOLUTION: 250.0000 mg | Freq: Four times a day (QID) | ORAL | 0 refills | Status: AC

## 2016-10-16 NOTE — Telephone Encounter (Signed)
Spoke with pt and she is aware of follow-up appt and will wait to discuss MRCP, and surgical referral at her OV with Dr. Havery Moros.

## 2016-10-16 NOTE — Telephone Encounter (Signed)
No I think she was treated for C diff in the hospital with resolution of symptoms and also treated for diverticulitis.  She has had multiple episodes of diverticulitis in the past year, I think she should consider a surgical evaluation for more definitive treatment as she is high risk for this. You can offer her this consult now or she can wait to see me and discuss it in the clinic visit.  Otherwise her CT scan was notable for a dilated pancreatic duct. This will need to be followed up with an MRCP to ensure her pancreas is normal if you can relay that to her. We can arrange this or also discuss further at clinic visit. Thanks

## 2016-10-16 NOTE — Telephone Encounter (Signed)
I see she had a + C diff after going to the hospital. Do you want to work her in before the 11/23/16 appointment? Please advise, thanks.

## 2016-10-16 NOTE — Discharge Summary (Signed)
Physician Discharge Summary  Emily Osborne  LEX:[id number]  DOB: [birth year]  DOA: 10/10/2016 PCP: [name], DO  Admit date: 10/10/2016 Discharge date: 10/16/2016  Admitted From: Home  Disposition:  Home   Recommendations for Outpatient Follow-up:  1. Follow up with PCP in 1-2 weeks 2. Please obtain BMP/CBC in one week to follow potassium and hemoglobin   Home Health: None  Equipment/Devices: None    Discharge Condition: Stable  CODE STATUS: FULL   Diet recommendation: Heart Healthy    Brief/Interim Summary: Emily Vannatter Rogersis a 56 y.o.femalewith medical history significant of allergy, asthma, cataracts, elevated blood pressure, fibromyalgia, Grace disease hyperthyroidism, GERD, hyperlipidemia, IBS, herpes zoster presented to the emergency department with complaints of worsening abdominal pain for the past 2 days. Patient been treated for diverticulitis and was on Augmentin, however for the past 2 days she has an increase in LLQ pain. Patient had CT scan abdomen/pelvis with contrast showing persistent and recurrent acute diverticulitis without perforation or abscess. Patient is admitted for failing outpatient therapy for diverticulitis, was treated with IV zosyn as patient has allergies to Flagyl. Patient had persistent high fever that was treated with IV tylenol. Patient was found to be C diff positive and was started on oral Vanco. Subsequently patient improved, diarrhea diminished and abdominal pain resolved. Patient remained afebrile for > 72 hrs and is tolerating diet well. Patient will be discharge home with follow up with PCP.   Subjective: Patient seen and examined with daughter at bedside. Feeling much better, have no abdominal pain. Tolerating diet well. No other concerns.   Discharge Diagnoses/Hospital Course:  Diverticulitis exacerbated by C diff also failed outpatient therapy. CT with no evidence of abscess or perforation - Improving  Completed a course of  Zosyn and Unasyn for 6 days, patient allergic to Flagyl  Blood cultures - negative  Follow up with GI in 1-2 week   C diff Colitis - in setting of recent abx use.  Continue oral vanco - for total of 14 days  Tolerating diet well  Patient remains afebrile for > 72 hrs  No need for retesting unless that symptoms worsen   Hypokalemia - due to severe diarrhea from c diff - resolved  K repleted  Check BMP in 1 week   Asthma - stable  Resume home meds   Chest pain w mild elevation of troponin - likely demand ischemia due to infection  Pain has resolved  No further cardiac work up at this time patient had a recent cardiac cath 10/03/16 with no abnormalities   GERD Protonix were d/ed due to C Diff   Essential hypertension Per patient, she only gets hypertensive when she has pain or Graves' disease. Continue atenolol 25 mg by mouth daily and monitor blood pressure.  Graves disease Continue methimazole 5 mg by mouth daily. Continue atenolol 25 mg by mouth daily.  Dilated pancreatic duct. Normal lipase - no clinical significance at this point  Will continue to monitor   All other chronic medical condition were stable during the hospitalization.  On the day of the discharge the patient's vitals were stable, and no other acute medical condition were reported by patient. the patient was felt safe to be discharge to home   Discharge Instructions  You were cared for by a hospitalist during your hospital stay. If you have any questions about your discharge medications or the care you received while you were in the hospital after you are discharged, you can call the  unit and asked to speak with the hospitalist on call if the hospitalist that took care of you is not available. Once you are discharged, your primary care physician will handle any further medical issues. Please note that NO REFILLS for any discharge medications will be authorized once you are discharged, as it is imperative  that you return to your primary care physician (or establish a relationship with a primary care physician if you do not have one) for your aftercare needs so that they can reassess your need for medications and monitor your lab values.  Discharge Instructions    Call MD for:  difficulty breathing, headache or visual disturbances    Complete by:  As directed    Call MD for:  extreme fatigue    Complete by:  As directed    Call MD for:  hives    Complete by:  As directed    Call MD for:  persistant dizziness or light-headedness    Complete by:  As directed    Call MD for:  persistant nausea and vomiting    Complete by:  As directed    Call MD for:  redness, tenderness, or signs of infection (pain, swelling, redness, odor or green/yellow discharge around incision site)    Complete by:  As directed    Call MD for:  severe uncontrolled pain    Complete by:  As directed    Call MD for:  temperature >100.4    Complete by:  As directed    Diet - low sodium heart healthy    Complete by:  As directed    Discharge instructions    Complete by:  As directed    Can return to work as long when diarrhea has resolved.  No need for retest   Increase activity slowly    Complete by:  As directed      Allergies as of 10/16/2016      Reactions   Aspirin Other (See Comments)   REACTION: anaphylaxis   Gentamicin Other (See Comments)   Eye drops turned the sclera bright red   Ibuprofen Other (See Comments)   REACTION: anaphylaxsis   Nsaids Other (See Comments)   REACTION: anaphylaxis   Doxycycline Other (See Comments)   REACTION: severe nausea/vomiting   Influenza A (h1n1) Monoval Vac Other (See Comments)   REACTION: sick for 3 weeks   Loratadine Other (See Comments)   Fatigue/weakness   Metronidazole Other (See Comments)   REACTION: red face/swelling   Ranitidine Hcl Other (See Comments)   REACTION: Lips turned red /peel   Sulfamethoxazole-trimethoprim Other (See Comments)   REACTION: face  red/peel   Tramadol Hcl Other (See Comments)   REACTION: paranoid      Medication List    STOP taking these medications   dexlansoprazole 60 MG capsule Commonly known as:  DEXILANT   hyoscyamine 0.125 MG tablet Commonly known as:  LEVSIN, ANASPAZ     TAKE these medications   albuterol 108 (90 Base) MCG/ACT inhaler Commonly known as:  PROAIR HFA USE 2 PUFFS EVERY 4 HOURS AS NEEDED FOR COUGH, WHEEZE OR SHORTNESS OF BREATH. What changed:  how much to take  how to take this  when to take this  reasons to take this  additional instructions   albuterol (2.5 MG/3ML) 0.083% nebulizer solution Commonly known as:  PROVENTIL Take 3 mLs (2.5 mg total) by nebulization every 6 (six) hours as needed for wheezing or shortness of breath. What changed:  Another medication  with the same name was changed. Make sure you understand how and when to take each.   ALPRAZolam 0.25 MG tablet Commonly known as:  XANAX Take 1 tablet (0.25 mg total) by mouth 3 (three) times daily as needed. What changed:  how much to take  when to take this  reasons to take this   atenolol 25 MG tablet Commonly known as:  TENORMIN Take 1 tablet (25 mg total) by mouth daily.   CALCIUM 1000 + D PO Take 1 tablet by mouth daily.   cholecalciferol 1000 units tablet Commonly known as:  VITAMIN D Take 1,000 Units by mouth daily.   CLINPRO 5000 1.1 % Pste Generic drug:  Sodium Fluoride See admin instructions. Brush teeth nightly   fexofenadine 180 MG tablet Commonly known as:  ALLEGRA Take 1 tablet (180 mg total) by mouth daily.   fluticasone 50 MCG/ACT nasal spray Commonly known as:  FLONASE PLACE 2 SPRAYS INTO BOTH NOSTRILS DAILY.   Fluticasone-Salmeterol 250-50 MCG/DOSE Aepb Commonly known as:  ADVAIR DISKUS Inhale 1 puff into the lungs 2 (two) times daily. What changed:  when to take this   FREESTYLE FREEDOM Kit 1 Device by Does not apply route daily.   FREESTYLE LITE test strip Generic  drug:  glucose blood USE TO CHECK BLOOD SUGAR ONCE A DAY   I.L.X. B-12 Elix Take 1 each by mouth daily as needed (energy). 1 droplet full   lidocaine 5 % Commonly known as:  LIDODERM APPLY 1 PATCH ON THE SKIN EVERY 12 HRS REMOVE, DISCARD PATCH WITHIN 12 HRS OR AS DIRECTED What changed:  See the new instructions.   meclizine 32 MG tablet Commonly known as:  ANTIVERT Take 1 tablet (32 mg total) by mouth 3 (three) times daily as needed for dizziness.   methimazole 5 MG tablet Commonly known as:  TAPAZOLE Take 0.5 tablets (2.5 mg total) by mouth daily.   triamcinolone cream 0.1 % Commonly known as:  KENALOG APPLY TO AFFECTED AREA TWICE A DAY What changed:  See the new instructions.   TYLENOL 500 MG tablet Generic drug:  acetaminophen Take 1,000 mg by mouth every 6 (six) hours as needed for moderate pain.   valACYclovir 1000 MG tablet Commonly known as:  VALTREX Take 1 tablet (1,000 mg total) by mouth 3 (three) times daily. What changed:  when to take this  additional instructions   vancomycin 50 mg/mL oral solution Commonly known as:  VANCOCIN Take 5 mLs (250 mg total) by mouth 4 (four) times daily.   VOLTAREN 1 % Gel Generic drug:  diclofenac sodium Apply 2 g topically 2 (two) times daily as needed (pain). For pain      Follow-up Information    [name], DO. Schedule an appointment as soon as possible for a visit in 1 week(s).   Specialty:  Family Medicine Contact information: [address] [place] [address] [phone]          Allergies  Allergen Reactions  . Aspirin Other (See Comments)    REACTION: anaphylaxis  . Gentamicin Other (See Comments)    Eye drops turned the sclera bright red  . Ibuprofen Other (See Comments)    REACTION: anaphylaxsis  . Nsaids Other (See Comments)    REACTION: anaphylaxis  . Doxycycline Other (See Comments)    REACTION: severe nausea/vomiting  . Influenza A (H1n1) Monoval Vac Other  (See Comments)    REACTION: sick for 3 weeks  . Loratadine  Other (See Comments)    Fatigue/weakness  . Metronidazole Other (See Comments)    REACTION: red face/swelling  . Ranitidine Hcl Other (See Comments)    REACTION: Lips turned red /peel  . Sulfamethoxazole-Trimethoprim Other (See Comments)    REACTION: face red/peel  . Tramadol Hcl Other (See Comments)    REACTION: paranoid    Consultations:  None    Procedures/Studies: Ct Abdomen Pelvis W Contrast  Result Date: 10/10/2016 CLINICAL DATA:  Acute onset of left-sided abdominal pain and pressure. Nausea. Recently treated diverticulitis. Initial encounter. EXAM: CT ABDOMEN AND PELVIS WITH CONTRAST TECHNIQUE: Multidetector CT imaging of the abdomen and pelvis was performed using the standard protocol following bolus administration of intravenous contrast. CONTRAST:  169m ISOVUE-300 IOPAMIDOL (ISOVUE-300) INJECTION 61% COMPARISON:  CT of the abdomen and pelvis performed 09/28/2016 FINDINGS: Lower chest: Minimal bibasilar atelectasis is noted. The visualized portions of the mediastinum are unremarkable. Hepatobiliary: The liver is unremarkable in appearance. The gallbladder is unremarkable in appearance. The common bile duct remains normal in caliber. Pancreas: There is dilatation of the pancreatic duct to 5 mm, of uncertain significance. Would correlate with pancreatic lab values. The pancreas is otherwise grossly unremarkable. Spleen: The spleen is unremarkable in appearance. Adrenals/Urinary Tract: The adrenal glands are unremarkable in appearance. The kidneys are within normal limits. There is no evidence of hydronephrosis. No renal or ureteral stones are identified. No perinephric stranding is seen. Stomach/Bowel: The stomach is unremarkable in appearance. The small bowel is within normal limits. The appendix is normal in caliber, without evidence of appendicitis. Scattered diverticulosis is noted along the entirety of the colon. There is  mild focal wall thickening at the mid to distal sigmoid colon, with associated soft tissue inflammation, compatible with persistent or recurrent diverticulitis. No definite perforation or abscess formation is seen. Mild inflammation again tracks superiorly at the left lower quadrant. Trace free fluid is seen within the pelvis. This is similar in appearance to the prior study. Vascular/Lymphatic: The abdominal aorta is unremarkable in appearance. The inferior vena cava is grossly unremarkable. No retroperitoneal lymphadenopathy is seen. No pelvic sidewall lymphadenopathy is identified. Reproductive: The bladder is mildly distended and grossly unremarkable. The patient is status post hysterectomy. No suspicious adnexal masses are seen. Other: No additional soft tissue abnormalities are seen. Musculoskeletal: No acute osseous abnormalities are identified. The visualized musculature is unremarkable in appearance. IMPRESSION: 1. Persistent or recurrent acute diverticulitis at the mid to distal sigmoid colon, with focal wall thickening and soft tissue inflammation. Trace free fluid within the pelvis. Mild soft tissue inflammation tracks superiorly at the left lower quadrant. No definite evidence of perforation or abscess formation at this time. The appearance is similar to the prior study. 2. Scattered diverticulosis along the entirety of the colon. 3. Dilatation of the pancreatic duct to 5 mm, of uncertain significance. Would correlate with pancreatic lab values; MRCP could be considered for further evaluation as deemed clinically appropriate. Electronically Signed   By: JGarald BaldingM.D.   On: 10/10/2016 17:34   Ct Abdomen Pelvis W Contrast  Result Date: 09/28/2016 CLINICAL DATA:  56year old female with abdominal and pelvic pain for 6 days. EXAM: CT ABDOMEN AND PELVIS WITH CONTRAST TECHNIQUE: Multidetector CT imaging of the abdomen and pelvis was performed using the standard protocol following bolus administration  of intravenous contrast. CONTRAST:  1027mISOVUE-300 IOPAMIDOL (ISOVUE-300) INJECTION 61% COMPARISON:  12/07/2015 CT FINDINGS: Lower chest: No acute abnormality.  Mild cardiomegaly noted. Hepatobiliary: Question mild hepatic steatosis. No other hepatic or  gallbladder abnormality identified. There is no evidence of biliary dilatation. Pancreas: Unremarkable Spleen: Unremarkable Adrenals/Urinary Tract: The kidneys, adrenal glands and bladder are unremarkable. Stomach/Bowel: Focal wall thickening of the proximal sigmoid colon is noted with adjacent inflammation likely representing diverticulitis. There is no evidence of pneumoperitoneum or abscess. A trace amount of free fluid is again noted. There is no evidence of bowel obstruction.  The appendix is normal. Vascular/Lymphatic: No enlarged lymph nodes or abdominal aortic aneurysm identified. Reproductive: Status post hysterectomy. No adnexal masses. Other: None Musculoskeletal: Moderate degenerative disc disease at L5-S1 noted. IMPRESSION: Proximal sigmoid colon wall thickening with adjacent inflammation likely representing acute diverticulitis. No evidence of pneumoperitoneum or abscess. Clinical follow-up recommended. Question mild hepatic steatosis. Electronically Signed   By: Margarette Canada M.D.   On: 09/28/2016 15:10   Dg Chest Port 1 View  Result Date: 10/14/2016 CLINICAL DATA:  Post insertion to confirm line placement. Hx of asthma, left heart cath and coronary angiography, hysterectomy. Nonsmoker. EXAM: PORTABLE CHEST 1 VIEW COMPARISON:  10/10/2009 FINDINGS: There is no central line or other device visible on the included field of view. Cardiac silhouette is normal in size. No mediastinal or hilar masses. No evidence of adenopathy. Clear lungs. No pleural effusion.  No pneumothorax. Skeletal structures are unremarkable. IMPRESSION: 1. No active disease of the chest. 2. No evidence of a central line or other device on the included field of view. Electronically  Signed   By: Lajean Manes M.D.   On: 10/14/2016 11:13   Dg Abd 2 Views  Result Date: 10/11/2016 CLINICAL DATA:  Abdominal pain and distention. EXAM: ABDOMEN - 2 VIEW COMPARISON:  CT scan from yesterday. FINDINGS: Upright film shows no evidence for intraperitoneal free air. Supine abdomen shows no gaseous small bowel dilatation. Enteric contrast from yesterday's CT scan is now in the nondilated colon. Visualized bony anatomy unremarkable. IMPRESSION: No evidence for intraperitoneal free air or bowel obstruction. Electronically Signed   By: Misty Stanley M.D.   On: 10/11/2016 20:46   Dg Abd Portable 1v  Result Date: 10/13/2016 CLINICAL DATA:  Abdomen distension EXAM: PORTABLE ABDOMEN - 1 VIEW COMPARISON:  10/11/2016 FINDINGS: Nonobstructed bowel-gas pattern. Clearing of contrast from the colon. Calcified pelvic phleboliths. IMPRESSION: Nonobstructed bowel-gas pattern with clearing of contrast from the colon Electronically Signed   By: Donavan Foil M.D.   On: 10/13/2016 21:41    Discharge Exam: Vitals:   10/16/16 0833 10/16/16 1043  BP: 129/87   Pulse: 70   Resp: 18   Temp: 99.5 F (37.5 C) 97.9 F (36.6 C)   Vitals:   10/16/16 0631 10/16/16 0827 10/16/16 0833 10/16/16 1043  BP: 138/80  129/87   Pulse: 83  70   Resp: 18  18   Temp: 98 F (36.7 C)  99.5 F (37.5 C) 97.9 F (36.6 C)  TempSrc: Oral  Oral Oral  SpO2: 99% 100% 98%   Weight:      Height:        General: Pt is alert, awake, not in acute distress Cardiovascular: RRR, S1/S2 +, no rubs, no gallops Respiratory: CTA bilaterally, no wheezing, no rhonchi Abdominal: Soft, NT, ND, bowel sounds + Extremities: no edema, no cyanosis  The results of significant diagnostics from this hospitalization (including imaging, microbiology, ancillary and laboratory) are listed below for reference.     Microbiology: Recent Results (from the past 240 hour(s))  Culture, blood (Routine X 2) w Reflex to ID Panel     Status: None  Collection Time: 10/11/16 11:27 AM  Result Value Ref Range Status   Specimen Description BLOOD LEFT HAND  Final   Special Requests IN PEDIATRIC BOTTLE 3CC  Final   Culture NO GROWTH 5 DAYS  Final   Report Status 10/16/2016 FINAL  Final  Culture, blood (Routine X 2) w Reflex to ID Panel     Status: None   Collection Time: 10/11/16 11:30 AM  Result Value Ref Range Status   Specimen Description BLOOD LEFT ASSIST CONTROL  Final   Special Requests BOTTLES DRAWN AEROBIC ONLY 5CC  Final   Culture NO GROWTH 5 DAYS  Final   Report Status 10/16/2016 FINAL  Final  C difficile quick scan w PCR reflex     Status: Abnormal   Collection Time: 10/12/16 12:34 PM  Result Value Ref Range Status   C Diff antigen POSITIVE (A) NEGATIVE Final   C Diff toxin POSITIVE (A) NEGATIVE Final   C Diff interpretation Toxin producing C. difficile detected.  Final    Comment: CRITICAL RESULT CALLED TO, READ BACK BY AND VERIFIED WITH: Chapman Moss, RN, 10/12/16 AT 1855 BY J FUDESCO   Gastrointestinal Panel by PCR , Stool     Status: None   Collection Time: 10/12/16 12:34 PM  Result Value Ref Range Status   Campylobacter species NOT DETECTED NOT DETECTED Final   Plesimonas shigelloides NOT DETECTED NOT DETECTED Final   Salmonella species NOT DETECTED NOT DETECTED Final   Yersinia enterocolitica NOT DETECTED NOT DETECTED Final   Vibrio species NOT DETECTED NOT DETECTED Final   Vibrio cholerae NOT DETECTED NOT DETECTED Final   Enteroaggregative E coli (EAEC) NOT DETECTED NOT DETECTED Final   Enteropathogenic E coli (EPEC) NOT DETECTED NOT DETECTED Final   Enterotoxigenic E coli (ETEC) NOT DETECTED NOT DETECTED Final   Shiga like toxin producing E coli (STEC) NOT DETECTED NOT DETECTED Final   Shigella/Enteroinvasive E coli (EIEC) NOT DETECTED NOT DETECTED Final   Cryptosporidium NOT DETECTED NOT DETECTED Final   Cyclospora cayetanensis NOT DETECTED NOT DETECTED Final   Entamoeba histolytica NOT DETECTED NOT DETECTED Final    Giardia lamblia NOT DETECTED NOT DETECTED Final   Adenovirus F40/41 NOT DETECTED NOT DETECTED Final   Astrovirus NOT DETECTED NOT DETECTED Final   Norovirus GI/GII NOT DETECTED NOT DETECTED Final   Rotavirus A NOT DETECTED NOT DETECTED Final   Sapovirus (I, II, IV, and V) NOT DETECTED NOT DETECTED Final     Labs: BNP (last 3 results) No results for input(s): BNP in the last 8760 hours. Basic Metabolic Panel:  Recent Labs Lab 10/12/16 0620 10/13/16 0512 10/14/16 0738 10/15/16 1424 10/16/16 0450  NA 138 138 140 142 141  K 3.4* 3.0* 3.1* 3.3* 3.6  CL 106 107 110 109 111  CO2 _0 GLUCOSE 95 81 105* 119* 98  BUN <5* 5* <5* <5* <5*  CREATININE 0.92 0.93 0.81 0.73 0.73  CALCIUM 7.9* 8.0* 8.2* 8.4* 8.4*   Liver Function Tests:  Recent Labs Lab 10/10/16 1350 10/11/16 0639 10/12/16 0620 10/14/16 0738  AST _1 ALT _2 ALKPHOS 61 56 57 50  BILITOT 0.7 1.2 1.5* 0.5  PROT 7.0 5.8* 5.5* 5.6*  ALBUMIN 3.8 3.0* 2.7* 2.7*    Recent Labs Lab 10/10/16 1350 10/11/16 0639  LIPASE 20 35   No results for input(s): AMMONIA in the last 168 hours. CBC:  Recent Labs Lab 10/10/16 1350 10/11/16 3142 10/12/16 7670  10/13/16 0512 10/14/16 0738  WBC 11.1* 12.5* 13.8* 13.5* 9.8  NEUTROABS 8.3* 9.5* 11.0* 10.6* 7.6  HGB 12.8 11.4* 11.2* 11.1* 10.6*  HCT 37.9 34.7* 34.1* 32.9* 31.3*  MCV 85.9 86.5 87.0 85.7 83.9  PLT 256 204 187 173 187   Cardiac Enzymes:  Recent Labs Lab 10/13/16 0932 10/13/16 1607 10/13/16 2046  TROPONINI 0.07* 0.07* 0.03*   BNP: Invalid input(s): POCBNP CBG: No results for input(s): GLUCAP in the last 168 hours. D-Dimer No results for input(s): DDIMER in the last 72 hours. Hgb A1c No results for input(s): HGBA1C in the last 72 hours. Lipid Profile No results for input(s): CHOL, HDL, LDLCALC, TRIG, CHOLHDL, LDLDIRECT in the last 72 hours. Thyroid function studies No results for input(s): TSH, T4TOTAL, T3FREE,  THYROIDAB in the last 72 hours.  Invalid input(s): FREET3 Anemia work up No results for input(s): VITAMINB12, FOLATE, FERRITIN, TIBC, IRON, RETICCTPCT in the last 72 hours. Urinalysis    Component Value Date/Time   COLORURINE YELLOW 10/10/2016 1310   APPEARANCEUR CLEAR 10/10/2016 1310   LABSPEC 1.005 10/10/2016 1310   PHURINE 7.0 10/10/2016 1310   GLUCOSEU NEGATIVE 10/10/2016 1310   HGBUR NEGATIVE 10/10/2016 1310   HGBUR negative 09/07/2010 0838   BILIRUBINUR NEGATIVE 10/10/2016 1310   BILIRUBINUR neg 09/28/2016 0930   KETONESUR NEGATIVE 10/10/2016 1310   PROTEINUR NEGATIVE 10/10/2016 1310   UROBILINOGEN negative 09/28/2016 0930   UROBILINOGEN negative 09/07/2010 0838   NITRITE NEGATIVE 10/10/2016 1310   LEUKOCYTESUR NEGATIVE 10/10/2016 1310   Sepsis Labs Invalid input(s): PROCALCITONIN,  WBC,  LACTICIDVEN Microbiology Recent Results (from the past 240 hour(s))  Culture, blood (Routine X 2) w Reflex to ID Panel     Status: None   Collection Time: 10/11/16 11:27 AM  Result Value Ref Range Status   Specimen Description BLOOD LEFT HAND  Final   Special Requests IN PEDIATRIC BOTTLE 3CC  Final   Culture NO GROWTH 5 DAYS  Final   Report Status 10/16/2016 FINAL  Final  Culture, blood (Routine X 2) w Reflex to ID Panel     Status: None   Collection Time: 10/11/16 11:30 AM  Result Value Ref Range Status   Specimen Description BLOOD LEFT ASSIST CONTROL  Final   Special Requests BOTTLES DRAWN AEROBIC ONLY 5CC  Final   Culture NO GROWTH 5 DAYS  Final   Report Status 10/16/2016 FINAL  Final  C difficile quick scan w PCR reflex     Status: Abnormal   Collection Time: 10/12/16 12:34 PM  Result Value Ref Range Status   C Diff antigen POSITIVE (A) NEGATIVE Final   C Diff toxin POSITIVE (A) NEGATIVE Final   C Diff interpretation Toxin producing C. difficile detected.  Final    Comment: CRITICAL RESULT CALLED TO, READ BACK BY AND VERIFIED WITH: Chapman Moss, RN, 10/12/16 AT 1855 BY J FUDESCO    Gastrointestinal Panel by PCR , Stool     Status: None   Collection Time: 10/12/16 12:34 PM  Result Value Ref Range Status   Campylobacter species NOT DETECTED NOT DETECTED Final   Plesimonas shigelloides NOT DETECTED NOT DETECTED Final   Salmonella species NOT DETECTED NOT DETECTED Final   Yersinia enterocolitica NOT DETECTED NOT DETECTED Final   Vibrio species NOT DETECTED NOT DETECTED Final   Vibrio cholerae NOT DETECTED NOT DETECTED Final   Enteroaggregative E coli (EAEC) NOT DETECTED NOT DETECTED Final   Enteropathogenic E coli (EPEC) NOT DETECTED NOT DETECTED Final   Enterotoxigenic E coli (  ETEC) NOT DETECTED NOT DETECTED Final   Shiga like toxin producing E coli (STEC) NOT DETECTED NOT DETECTED Final   Shigella/Enteroinvasive E coli (EIEC) NOT DETECTED NOT DETECTED Final   Cryptosporidium NOT DETECTED NOT DETECTED Final   Cyclospora cayetanensis NOT DETECTED NOT DETECTED Final   Entamoeba histolytica NOT DETECTED NOT DETECTED Final   Giardia lamblia NOT DETECTED NOT DETECTED Final   Adenovirus F40/41 NOT DETECTED NOT DETECTED Final   Astrovirus NOT DETECTED NOT DETECTED Final   Norovirus GI/GII NOT DETECTED NOT DETECTED Final   Rotavirus A NOT DETECTED NOT DETECTED Final   Sapovirus (I, II, IV, and V) NOT DETECTED NOT DETECTED Final     Time coordinating discharge: Over 39 minutes  SIGNED:  Chipper Oman, MD  [facility] 10/16/2016, 12:55 PM  Pager please text page via  [email or web address] Password [email or web address]

## 2016-10-16 NOTE — Progress Notes (Signed)
Patient discharge teaching given, including activity, diet, follow-up appoints, and medications. Patient verbalized understanding of all discharge instructions. IV access was d/c'd. Vitals are stable. Skin is intact except as charted in most recent assessments. Pt to be escorted out by NT, to be driven home by family.  Ashaki Frosch, MBA, BSN, RN 

## 2016-10-16 NOTE — Telephone Encounter (Signed)
Patient called and sch follow up. Only wanting to see Dr.Armbruster pt sch for 11-23-16

## 2016-10-22 ENCOUNTER — Telehealth: Payer: Self-pay | Admitting: Gastroenterology

## 2016-10-22 NOTE — Telephone Encounter (Signed)
Spoke to patient, she was in the hospital for diverticulitis while there developed C-diff. She is scheduled for follow up in office on 3/12.

## 2016-10-22 NOTE — Telephone Encounter (Signed)
Okay; thanks.

## 2016-10-22 NOTE — Progress Notes (Signed)
Cardiology Office Note    Date:  10/24/2016   ID:  Emily, Osborne 16-Jul-1961, MRN [id number]  PCP:  [name], DO  Cardiologist:  Dr. Johnsie Cancel  CC: post cath follow up  History of Present Illness:  Emily Osborne is a 56 y.o. female with a history of HTN, HLD, GERD/HPylori and hyperthyroidism on tapazole who presents to clinic for post cath follow up.   She was recently seen in the office by Dr Johnsie Cancel for exertional chest pain. She was set up for heart cath which showed normal cors, systolic function and LVEDP.    She was recently admitted 2/21-2/27 for acute diverticulitis and Cdiff. She was treated with vanc and zosyn.   Today she presents to clinic for follow up. She has been feeling weak and nauseated since discharge. She had one episode of chest pain in the hospital that was relieved by a whole tablet of atenolol. No SOB. No LE edema, orthopnea or PND. Some dizziness but no syncope. No blood in stool or urine. No palpitations.      Past Medical History:  Diagnosis Date  . Allergy   . Asthma    pt has inhaler  . Cataract    bil eyes  . Edema   . Elevated blood pressure reading without diagnosis of hypertension    "just elevated when I'm in pain" (12/07/2015)  . Fibromyalgia   . Ganglion of joint    right wrist  . GERD (gastroesophageal reflux disease)   . Herpes zoster without mention of complication   . Hyperlipidemia   . IBS (irritable bowel syndrome)   . Nontraumatic rupture of Achilles tendon   . Other acute reactions to stress   . Pain in joint, shoulder region   . Pain in joint, upper arm   . Pain in limb   . Personal history of other diseases of digestive system   . Thyroid disease    hyperthyroidism    Past Surgical History:  Procedure Laterality Date  . ABDOMINAL HYSTERECTOMY    . ACHILLES TENDON REPAIR Right 2005  . BUNIONECTOMY Bilateral    Bunionectomy 1983  . GANGLION CYST EXCISION Right    wrist  . LEFT HEART CATH AND CORONARY  ANGIOGRAPHY N/A 10/03/2016   Procedure: Left Heart Cath and Coronary Angiography;  Surgeon: Burnell Blanks, MD;  Location: [facility];  Service: Cardiovascular;  Laterality: N/A;  . MENISCUS REPAIR Right 04/2014  . TUBAL LIGATION    . wisdomteeth extraction      Current Medications: Outpatient Medications Prior to Visit  Medication Sig Dispense Refill  . acetaminophen (TYLENOL) 500 MG tablet Take 1,000 mg by mouth every 6 (six) hours as needed for moderate pain.     Marland Kitchen albuterol (PROAIR HFA) 108 (90 Base) MCG/ACT inhaler USE 2 PUFFS EVERY 4 HOURS AS NEEDED FOR COUGH, WHEEZE OR SHORTNESS OF BREATH. (Patient taking differently: Inhale 2 puffs into the lungs every 4 (four) hours as needed for wheezing or shortness of breath. ) 8.5 each 2  . albuterol (PROVENTIL) (2.5 MG/3ML) 0.083% nebulizer solution Take 3 mLs (2.5 mg total) by nebulization every 6 (six) hours as needed for wheezing or shortness of breath. 75 mL 3  . ALPRAZolam (XANAX) 0.25 MG tablet Take 1 tablet (0.25 mg total) by mouth 3 (three) times daily as needed. (Patient taking differently: Take 0.125-0.25 mg by mouth 2 (two) times daily as needed for anxiety or sleep. ) 30 tablet 0  .  atenolol (TENORMIN) 25 MG tablet Take 1 tablet (25 mg total) by mouth daily. 90 tablet 0  . Blood Glucose Monitoring Suppl (FREESTYLE FREEDOM) KIT 1 Device by Does not apply route daily. 1 each 0  . Calcium Carb-Cholecalciferol (CALCIUM 1000 + D PO) Take 1 tablet by mouth daily.    . cholecalciferol (VITAMIN D) 1000 units tablet Take 1,000 Units by mouth daily.     Marland Kitchen CLINPRO 5000 1.1 % PSTE See admin instructions. Brush teeth nightly  4  . diclofenac sodium (VOLTAREN) 1 % GEL Apply 2 g topically 2 (two) times daily as needed (pain). For pain    . fexofenadine (ALLEGRA) 180 MG tablet Take 1 tablet (180 mg total) by mouth daily. 90 tablet 3  . fluticasone (FLONASE) 50 MCG/ACT nasal spray PLACE 2 SPRAYS INTO BOTH NOSTRILS DAILY. 16 g 4  .  Fluticasone-Salmeterol (ADVAIR DISKUS) 250-50 MCG/DOSE AEPB Inhale 1 puff into the lungs 2 (two) times daily. (Patient taking differently: Inhale 1 puff into the lungs daily. ) 60 each 2  . FREESTYLE LITE test strip USE TO CHECK BLOOD SUGAR ONCE A DAY 100 each 6  . Iron Combinations (I.L.X. B-12) ELIX Take 1 each by mouth daily as needed (energy). 1 droplet full    . lidocaine (LIDODERM) 5 % APPLY 1 PATCH ON THE SKIN EVERY 12 HRS REMOVE, DISCARD PATCH WITHIN 12 HRS OR AS DIRECTED (Patient taking differently: APPLY 1 PATCH ON THE SKIN EVERY 12 HRS REMOVE, DISCARD PATCH WITHIN 12 HRS AS NEEDED FOR PAIN) 30 patch 0  . meclizine (ANTIVERT) 32 MG tablet Take 1 tablet (32 mg total) by mouth 3 (three) times daily as needed for dizziness. 30 tablet 0  . methimazole (TAPAZOLE) 5 MG tablet Take 0.5 tablets (2.5 mg total) by mouth daily. 90 tablet 0  . ondansetron (ZOFRAN) 4 MG tablet 1-2 po qid prn nausea 20 tablet 0  . triamcinolone cream (KENALOG) 0.1 % APPLY TO AFFECTED AREA TWICE A DAY (Patient taking differently: APPLY TO AFFECTED AREA TWICE A DAY AS NEEDED FOR RASH) 30 g 0  . valACYclovir (VALTREX) 1000 MG tablet Take 1 tablet (1,000 mg total) by mouth 3 (three) times daily. (Patient taking differently: Take 1,000 mg by mouth. Take 2g twice a day at first sign of fever blister, then take 1g twice daily until symptoms are gone) 30 tablet 5  . vancomycin (VANCOCIN) 50 mg/mL oral solution Take 5 mLs (250 mg total) by mouth 4 (four) times daily. 200 mL 0   No facility-administered medications prior to visit.      Allergies:   Aspirin; Gentamicin; Ibuprofen; Nsaids; Doxycycline; Influenza a (h1n1) monoval vac; Loratadine; Metronidazole; Ranitidine hcl; Sulfamethoxazole-trimethoprim; and Tramadol hcl   Social History   Social History  . Marital status: Divorced    Spouse name: N/A  . Number of children: 2  . Years of education: N/A   Occupational History  . Nurse/teacher    Social History Main  Topics  . Smoking status: Never Smoker  . Smokeless tobacco: Never Used  . Alcohol use No  . Drug use: No  . Sexual activity: Not Currently    Partners: Male   Other Topics Concern  . None   Social History Narrative   Lives with alone.  She has two daughter.   She works as a Programme researcher, broadcasting/film/video at [employer]--- no     Family History:  The patient's family history includes Cancer in her brother; Coronary artery disease in  her brother; Diabetes in her father; Heart disease in her father; Hyperlipidemia in her mother; Hypertension in her brother, father, and mother; Kidney disease in her father; Leukemia in her brother; Lupus in her daughter; Pernicious anemia in her sister; Thyroid disease in her mother and sister.     ROS:   Please see the history of present illness.    ROS All other systems reviewed and are negative.   PHYSICAL EXAM:   VS:  BP 118/82   Pulse 73   Ht 5' 5"  (1.651 m)   Wt 228 lb 6.4 oz (103.6 kg)   SpO2 98%   BMI 38.01 kg/m    GEN: Well nourished, well developed, in no acute distress  HEENT: normal  Neck: no JVD, carotid bruits, or masses Cardiac: RRR; no murmurs, rubs, or gallops,no edema  Respiratory:  clear to auscultation bilaterally, normal work of breathing GI: soft, nontender, nondistended, + BS MS: no deformity or atrophy  Skin: warm and dry, no rash Neuro:  Alert and Oriented x 3, Strength and sensation are intact Psych: euthymic mood, full affect   Wt Readings from Last 3 Encounters:  10/24/16 228 lb 6.4 oz (103.6 kg)  10/23/16 228 lb 9.6 oz (103.7 kg)  10/14/16 263 lb 11.2 oz (119.6 kg)      Studies/Labs Reviewed:   EKG:  EKG is NOT ordered today.    Recent Labs: 02/06/2016: Magnesium 2.0 10/05/2016: TSH 1.11 10/23/2016: ALT 23; BUN 9; Creatinine, Ser 0.88; Hemoglobin 13.2; Platelets 354.0; Potassium 4.1; Sodium 143   Lipid Panel    Component Value Date/Time   CHOL 201 (H) 03/23/2016 1501   TRIG 180 (H) 03/23/2016 1501   HDL  54 03/23/2016 1501   CHOLHDL 3.7 03/23/2016 1501   VLDL 36 (H) 03/23/2016 1501   LDLCALC 111 03/23/2016 1501   LDLDIRECT 139.4 12/26/2011 0905    Additional studies/ records that were reviewed today include:   10/03/16 Procedures  Left Heart Cath and Coronary Angiography  Conclusion    The left ventricular systolic function is normal.  LV end diastolic pressure is normal.  The left ventricular ejection fraction is greater than 65% by visual estimate.  There is no mitral valve regurgitation.   1. No angiographic evidence of CAD 2. Normal LV systolic function 3. Normal LVEDP  Recommendations: NO further ischemic workup.      2D ECHO: 12/08/2015 LV EF: 60% -   65% Study Conclusions - Left ventricle: The cavity size was normal. Systolic function was   normal. The estimated ejection fraction was in the range of 60%   to 65%. Wall motion was normal; there were no regional wall   motion abnormalities. Doppler parameters are consistent with   abnormal left ventricular relaxation (grade 1 diastolic   dysfunction). There was no evidence of elevated ventricular   filling pressure by Doppler parameters. - Aortic valve: There was no regurgitation. - Aortic root: The aortic root was normal in size. - Mitral valve: Structurally normal valve. There was no   regurgitation. - Left atrium: The atrium was normal in size. - Right ventricle: Systolic function was normal. - Right atrium: The atrium was normal in size. - Tricuspid valve: There was no regurgitation. - Pulmonic valve: There was no regurgitation. - Pulmonary arteries: Systolic pressure was within the normal   range. - Inferior vena cava: The vessel was normal in size. - Pericardium, extracardiac: There was no pericardial effusion.  ASSESSMENT & PLAN:   Chest pain: improved. Recent cath  in 09/2016 with clean cors. No further work up  HTN: BP well controlled.   HLD: LDL 111. Continue Continue diet and exercise    Pre diabetes: HgA1c 5.9. Continue current regimen   Medication Adjustments/Labs and Tests Ordered: Current medicines are reviewed at length with the patient today.  Concerns regarding medicines are outlined above.  Medication changes, Labs and Tests ordered today are listed in the Patient Instructions below. Patient Instructions  Medication Instructions:  Your physician recommends that you continue on your current medications as directed. Please refer to the Current Medication list given to you today.   Labwork: None ordered  Testing/Procedures: None ordered  Follow-Up: Your physician recommends that you schedule a follow-up appointment in: AS NEEDED   Any Other Special Instructions Will Be Listed Below (If Applicable).     If you need a refill on your cardiac medications before your next appointment, please call your pharmacy.      Signed, Angelena Form, PA-C  10/24/2016 9:20 AM    [facility] [address], [place], [place]  [address] Phone: [phone]; Fax: [phone]

## 2016-10-22 NOTE — Telephone Encounter (Signed)
Emily Osborne I got this message back from Epic stating the patient did not open her mychart message. Can you relay she got this information? Thanks much    Hello Mrs. Stann Mainland.  I noted the mild steatosis in your liver on the CT scan. I think that is an incidental finding and likely not related to the symptoms you describe.   Hopefully after your second course of antibiotics, the H pylori is gone. Testing to make sure the H pylori is gone needs to be done. We usually recommend a stool study about 1 month after you have completed the antibiotics, and you need to be off the Dexilant / antacid medication for 2 weeks prior to submitting this. If the H pylori infection persists, we will need to find another treatment regimen to make sure this goes away. If it is gone and your symptoms persist, we can discuss other options we have to treat the symptoms that you are having.   It would be good to see you in the clinic to review all of this, especially if your symptoms persist. Please call our office and ask to speak with Emily Osborne, the nurse who works with me, and she can coordinate your office follow up and ensure your stool test is in the lab.   Please let me know if you have any additional questions, thanks.  Dr. Loni Muse

## 2016-10-23 ENCOUNTER — Encounter: Payer: Self-pay | Admitting: Family Medicine

## 2016-10-23 ENCOUNTER — Ambulatory Visit (INDEPENDENT_AMBULATORY_CARE_PROVIDER_SITE_OTHER): Payer: BC Managed Care – PPO | Admitting: Family Medicine

## 2016-10-23 VITALS — BP 110/80 | HR 64 | Temp 98.1°F | Resp 18 | Ht 65.0 in | Wt 228.6 lb

## 2016-10-23 DIAGNOSIS — D649 Anemia, unspecified: Secondary | ICD-10-CM | POA: Diagnosis not present

## 2016-10-23 DIAGNOSIS — R110 Nausea: Secondary | ICD-10-CM

## 2016-10-23 DIAGNOSIS — K5732 Diverticulitis of large intestine without perforation or abscess without bleeding: Secondary | ICD-10-CM

## 2016-10-23 DIAGNOSIS — K5792 Diverticulitis of intestine, part unspecified, without perforation or abscess without bleeding: Secondary | ICD-10-CM

## 2016-10-23 LAB — COMPREHENSIVE METABOLIC PANEL
ALBUMIN: 4.3 g/dL (ref 3.5–5.2)
ALK PHOS: 69 U/L (ref 39–117)
ALT: 23 U/L (ref 0–35)
AST: 19 U/L (ref 0–37)
BUN: 9 mg/dL (ref 6–23)
CALCIUM: 9.6 mg/dL (ref 8.4–10.5)
CHLORIDE: 105 meq/L (ref 96–112)
CO2: 32 mEq/L (ref 19–32)
Creatinine, Ser: 0.88 mg/dL (ref 0.40–1.20)
GFR: 85.25 mL/min (ref >60.00)
Glucose, Bld: 89 mg/dL (ref 70–99)
POTASSIUM: 4.1 meq/L (ref 3.5–5.1)
Sodium: 143 mEq/L (ref 135–145)
TOTAL PROTEIN: 7.8 g/dL (ref 6.0–8.3)
Total Bilirubin: 0.4 mg/dL (ref 0.2–1.2)

## 2016-10-23 LAB — CBC WITH DIFFERENTIAL/PLATELET
BASOS PCT: 0.4 % (ref 0.0–3.0)
Basophils Absolute: 0 10*3/uL (ref 0.0–0.1)
EOS ABS: 0.1 10*3/uL (ref 0.0–0.7)
EOS PCT: 1.6 % (ref 0.0–5.0)
HEMATOCRIT: 40.3 % (ref 36.0–46.0)
HEMOGLOBIN: 13.2 g/dL (ref 12.0–15.0)
LYMPHS PCT: 35.2 % (ref 12.0–46.0)
Lymphs Abs: 2 10*3/uL (ref 0.7–4.0)
MCHC: 32.7 g/dL (ref 30.0–36.0)
MCV: 88 fl (ref 78.0–100.0)
MONO ABS: 0.4 10*3/uL (ref 0.1–1.0)
Monocytes Relative: 7.6 % (ref 3.0–12.0)
Neutro Abs: 3.1 10*3/uL (ref 1.4–7.7)
Neutrophils Relative %: 55.2 % (ref 43.0–77.0)
Platelets: 354 10*3/uL (ref 150.0–400.0)
RBC: 4.58 Mil/uL (ref 3.87–5.11)
RDW: 14.7 % (ref 11.5–15.5)
WBC: 5.6 10*3/uL (ref 4.0–10.5)

## 2016-10-23 MED ORDER — ONDANSETRON HCL 4 MG PO TABS: ORAL_TABLET | ORAL | 0 refills | Status: DC

## 2016-10-23 NOTE — Progress Notes (Signed)
I acted as a Education administrator for Dr. Royden Purl, LPN     Subjective:    Patient ID: Emily Osborne, female    DOB: 11/16/1960, 56 y.o.   MRN: [id number]  Chief Complaint  Patient presents with  . Hospitalization Follow-up    HPI  Patient is in today for f/u hospital d/c for diverticulitis, and c diff about 2 weeks ago. Pt is still struggling with nausea but is able to eat and drink.  She has a f/u with GI on Monday.  Pt also needs fmla paperwork filled out.     Patient Care Team: [name], DO as PCP - General Philemon Kingdom, MD as Consulting Physician (Internal Medicine) Manus Gunning, MD as Consulting Physician (Gastroenterology)   Past Medical History:  Diagnosis Date  . Allergy   . Asthma    pt has inhaler  . Cataract    bil eyes  . Edema   . Elevated blood pressure reading without diagnosis of hypertension    "just elevated when I'm in pain" (12/07/2015)  . Fibromyalgia   . Ganglion of joint    right wrist  . GERD (gastroesophageal reflux disease)   . Herpes zoster without mention of complication   . Hyperlipidemia   . IBS (irritable bowel syndrome)   . Nontraumatic rupture of Achilles tendon   . Other acute reactions to stress   . Pain in joint, shoulder region   . Pain in joint, upper arm   . Pain in limb   . Personal history of other diseases of digestive system   . Thyroid disease    hyperthyroidism    Past Surgical History:  Procedure Laterality Date  . ABDOMINAL HYSTERECTOMY    . ACHILLES TENDON REPAIR Right 2005  . BUNIONECTOMY Bilateral    Bunionectomy 1983  . GANGLION CYST EXCISION Right    wrist  . LEFT HEART CATH AND CORONARY ANGIOGRAPHY N/A 10/03/2016   Procedure: Left Heart Cath and Coronary Angiography;  Surgeon: Burnell Blanks, MD;  Location: [facility];  Service: Cardiovascular;  Laterality: N/A;  . MENISCUS REPAIR Right 04/2014  . TUBAL LIGATION    . wisdomteeth extraction      Family History  Problem  Relation Age of Onset  . Hypertension Father   . Diabetes Father   . Heart disease Father   . Kidney disease Father     Died, 36  . Leukemia Brother   . Cancer Brother     myleoblastic anemia  . Hyperlipidemia Mother   . Hypertension Mother     Died, 95  . Thyroid disease Mother     Thyroid surgery  . Pernicious anemia Sister   . Thyroid disease Sister     On thyroid Rx  . Lupus Daughter   . Coronary artery disease Brother   . Hypertension Brother   . Colon cancer Neg Hx   . Esophageal cancer Neg Hx   . Rectal cancer Neg Hx   . Stomach cancer Neg Hx   . Pancreatic cancer Neg Hx     Social History   Social History  . Marital status: Divorced    Spouse name: N/A  . Number of children: 2  . Years of education: N/A   Occupational History  . Nurse/teacher    Social History Main Topics  . Smoking status: Never Smoker  . Smokeless tobacco: Never Used  . Alcohol use No  . Drug use: No  . Sexual activity:  Not Currently    Partners: Male   Other Topics Concern  . Not on file   Social History Narrative   Lives with alone.  She has two daughter.   She works as a Programme researcher, broadcasting/film/video at [employer]--- no    Outpatient Medications Prior to Visit  Medication Sig Dispense Refill  . acetaminophen (TYLENOL) 500 MG tablet Take 1,000 mg by mouth every 6 (six) hours as needed for moderate pain.     Marland Kitchen albuterol (PROAIR HFA) 108 (90 Base) MCG/ACT inhaler USE 2 PUFFS EVERY 4 HOURS AS NEEDED FOR COUGH, WHEEZE OR SHORTNESS OF BREATH. (Patient taking differently: Inhale 2 puffs into the lungs every 4 (four) hours as needed for wheezing or shortness of breath. ) 8.5 each 2  . albuterol (PROVENTIL) (2.5 MG/3ML) 0.083% nebulizer solution Take 3 mLs (2.5 mg total) by nebulization every 6 (six) hours as needed for wheezing or shortness of breath. 75 mL 3  . ALPRAZolam (XANAX) 0.25 MG tablet Take 1 tablet (0.25 mg total) by mouth 3 (three) times daily as needed. (Patient taking  differently: Take 0.125-0.25 mg by mouth 2 (two) times daily as needed for anxiety or sleep. ) 30 tablet 0  . atenolol (TENORMIN) 25 MG tablet Take 1 tablet (25 mg total) by mouth daily. 90 tablet 0  . Blood Glucose Monitoring Suppl (FREESTYLE FREEDOM) KIT 1 Device by Does not apply route daily. 1 each 0  . Calcium Carb-Cholecalciferol (CALCIUM 1000 + D PO) Take 1 tablet by mouth daily.    . cholecalciferol (VITAMIN D) 1000 units tablet Take 1,000 Units by mouth daily.     Marland Kitchen CLINPRO 5000 1.1 % PSTE See admin instructions. Brush teeth nightly  4  . diclofenac sodium (VOLTAREN) 1 % GEL Apply 2 g topically 2 (two) times daily as needed (pain). For pain    . fexofenadine (ALLEGRA) 180 MG tablet Take 1 tablet (180 mg total) by mouth daily. 90 tablet 3  . fluticasone (FLONASE) 50 MCG/ACT nasal spray PLACE 2 SPRAYS INTO BOTH NOSTRILS DAILY. 16 g 4  . Fluticasone-Salmeterol (ADVAIR DISKUS) 250-50 MCG/DOSE AEPB Inhale 1 puff into the lungs 2 (two) times daily. (Patient taking differently: Inhale 1 puff into the lungs daily. ) 60 each 2  . FREESTYLE LITE test strip USE TO CHECK BLOOD SUGAR ONCE A DAY 100 each 6  . Iron Combinations (I.L.X. B-12) ELIX Take 1 each by mouth daily as needed (energy). 1 droplet full    . lidocaine (LIDODERM) 5 % APPLY 1 PATCH ON THE SKIN EVERY 12 HRS REMOVE, DISCARD PATCH WITHIN 12 HRS OR AS DIRECTED (Patient taking differently: APPLY 1 PATCH ON THE SKIN EVERY 12 HRS REMOVE, DISCARD PATCH WITHIN 12 HRS AS NEEDED FOR PAIN) 30 patch 0  . meclizine (ANTIVERT) 32 MG tablet Take 1 tablet (32 mg total) by mouth 3 (three) times daily as needed for dizziness. 30 tablet 0  . methimazole (TAPAZOLE) 5 MG tablet Take 0.5 tablets (2.5 mg total) by mouth daily. 90 tablet 0  . triamcinolone cream (KENALOG) 0.1 % APPLY TO AFFECTED AREA TWICE A DAY (Patient taking differently: APPLY TO AFFECTED AREA TWICE A DAY AS NEEDED FOR RASH) 30 g 0  . valACYclovir (VALTREX) 1000 MG tablet Take 1 tablet (1,000  mg total) by mouth 3 (three) times daily. (Patient taking differently: Take 1,000 mg by mouth. Take 2g twice a day at first sign of fever blister, then take 1g twice daily until symptoms are gone)  30 tablet 5  . vancomycin (VANCOCIN) 50 mg/mL oral solution Take 5 mLs (250 mg total) by mouth 4 (four) times daily. 200 mL 0   No facility-administered medications prior to visit.     Allergies  Allergen Reactions  . Aspirin Other (See Comments)    REACTION: anaphylaxis  . Gentamicin Other (See Comments)    Eye drops turned the sclera bright red  . Ibuprofen Other (See Comments)    REACTION: anaphylaxsis  . Nsaids Other (See Comments)    REACTION: anaphylaxis  . Doxycycline Other (See Comments)    REACTION: severe nausea/vomiting  . Influenza A (H1n1) Monoval Vac Other (See Comments)    REACTION: sick for 3 weeks  . Loratadine Other (See Comments)    Fatigue/weakness  . Metronidazole Other (See Comments)    REACTION: red face/swelling  . Ranitidine Hcl Other (See Comments)    REACTION: Lips turned red /peel  . Sulfamethoxazole-Trimethoprim Other (See Comments)    REACTION: face red/peel  . Tramadol Hcl Other (See Comments)    REACTION: paranoid    Review of Systems  Constitutional: Positive for malaise/fatigue. Negative for fever.  HENT: Negative for congestion.   Eyes: Negative for blurred vision.  Respiratory: Negative for cough.   Cardiovascular: Negative for chest pain and palpitations.  Gastrointestinal: Positive for nausea. Negative for blood in stool, constipation, diarrhea, melena and vomiting.  Musculoskeletal: Negative for back pain.  Skin: Negative for rash.  Neurological: Positive for weakness. Negative for loss of consciousness and headaches.       Objective:    Physical Exam  Constitutional: She is oriented to person, place, and time. She appears well-developed and well-nourished. No distress.  HENT:  Head: Normocephalic and atraumatic.  Eyes: Conjunctivae  are normal. Pupils are equal, round, and reactive to light.  Neck: Normal range of motion. No thyromegaly present.  Cardiovascular: Normal rate and regular rhythm.   Pulmonary/Chest: Effort normal and breath sounds normal. She has no wheezes.  Abdominal: Soft. Bowel sounds are normal. There is no tenderness.  Musculoskeletal: Normal range of motion. She exhibits no edema or deformity.  Neurological: She is alert and oriented to person, place, and time.  Skin: Skin is warm and dry. She is not diaphoretic.  Psychiatric: She has a normal mood and affect. Her behavior is normal. Judgment and thought content normal.  Nursing note and vitals reviewed.   BP 110/80 (BP Location: Left Arm, Patient Position: Sitting, Cuff Size: Large)   Pulse 64   Temp 98.1 F (36.7 C) (Oral)   Resp 18   Ht _0  (1.651 m)   Wt 228 lb 9.6 oz (103.7 kg)   SpO2 97%   BMI 38.04 kg/m  Wt Readings from Last 3 Encounters:  10/23/16 228 lb 9.6 oz (103.7 kg)  10/14/16 263 lb 11.2 oz (119.6 kg)  10/05/16 235 lb (106.6 kg)     Lab Results  Component Value Date   WBC 9.8 10/14/2016   HGB 10.6 (L) 10/14/2016   HCT 31.3 (L) 10/14/2016   PLT 187 10/14/2016   GLUCOSE 98 10/16/2016   CHOL 201 (H) 03/23/2016   TRIG 180 (H) 03/23/2016   HDL 54 03/23/2016   LDLDIRECT 139.4 12/26/2011   LDLCALC 111 03/23/2016   ALT 21 10/14/2016   AST 25 10/14/2016   NA 141 10/16/2016   K 3.6 10/16/2016   CL 111 10/16/2016   CREATININE 0.73 10/16/2016   BUN <5 (L) 10/16/2016   CO2 26 10/16/2016  TSH 1.11 10/05/2016   INR 0.93 10/03/2016   HGBA1C 5.9 (H) 12/07/2015    Lab Results  Component Value Date   TSH 1.11 10/05/2016   Lab Results  Component Value Date   WBC 9.8 10/14/2016   HGB 10.6 (L) 10/14/2016   HCT 31.3 (L) 10/14/2016   MCV 83.9 10/14/2016   PLT 187 10/14/2016   Lab Results  Component Value Date   NA 141 10/16/2016   K 3.6 10/16/2016   CO2 26 10/16/2016   GLUCOSE 98 10/16/2016   BUN <5 (L)  10/16/2016   CREATININE 0.73 10/16/2016   BILITOT 0.5 10/14/2016   ALKPHOS 50 10/14/2016   AST 25 10/14/2016   ALT 21 10/14/2016   PROT 5.6 (L) 10/14/2016   ALBUMIN 2.7 (L) 10/14/2016   CALCIUM 8.4 (L) 10/16/2016   ANIONGAP 4 (L) 10/16/2016   GFR 81.01 09/28/2016   Lab Results  Component Value Date   CHOL 201 (H) 03/23/2016   Lab Results  Component Value Date   HDL 54 03/23/2016   Lab Results  Component Value Date   LDLCALC 111 03/23/2016   Lab Results  Component Value Date   TRIG 180 (H) 03/23/2016   Lab Results  Component Value Date   CHOLHDL 3.7 03/23/2016   Lab Results  Component Value Date   HGBA1C 5.9 (H) 12/07/2015       Assessment & Plan:   Problem List Items Addressed This Visit      Unprioritized   Diverticulitis - Primary   Relevant Orders   Comprehensive metabolic panel   Acute diverticulitis    Finish antibiotics and f/u GI Mon Check labs today since pt feels so bad but suspect abx making her feel this way        Other Visit Diagnoses    Anemia, unspecified type       Relevant Orders   CBC with Differential/Platelet   Nausea       Relevant Medications   ondansetron (ZOFRAN) 4 MG tablet      I am having Emily Osborne start on ondansetron. I am also having her maintain her acetaminophen, fexofenadine, FREESTYLE FREEDOM, cholecalciferol, albuterol, diclofenac sodium, meclizine, I.L.X. B-12, Fluticasone-Salmeterol, FREESTYLE LITE, valACYclovir, CLINPRO 5000, lidocaine, albuterol, fluticasone, triamcinolone cream, ALPRAZolam, Calcium Carb-Cholecalciferol (CALCIUM 1000 + D PO), methimazole, atenolol, and vancomycin.  Meds ordered this encounter  Medications  . ondansetron (ZOFRAN) 4 MG tablet    Sig: 1-2 po qid prn nausea    Dispense:  20 tablet    Refill:  0    CMA served as scribe during this visit. History, Physical and Plan performed by medical provider. Documentation and orders reviewed and attested to.  [name],  DO  Patient ID: Emily Osborne, female   DOB: [birth year], 56 y.o.   MRN: [id number]

## 2016-10-23 NOTE — Patient Instructions (Signed)

## 2016-10-23 NOTE — Assessment & Plan Note (Signed)
Finish antibiotics and f/u GI Mon Check labs today since pt feels so bad but suspect abx making her feel this way

## 2016-10-23 NOTE — Progress Notes (Signed)
Pre visit review using our clinic review tool, if applicable. No additional management support is needed unless otherwise documented below in the visit note. 

## 2016-10-24 ENCOUNTER — Encounter: Payer: Self-pay | Admitting: Physician Assistant

## 2016-10-24 ENCOUNTER — Ambulatory Visit (INDEPENDENT_AMBULATORY_CARE_PROVIDER_SITE_OTHER): Payer: BC Managed Care – PPO | Admitting: Physician Assistant

## 2016-10-24 VITALS — BP 118/82 | HR 73 | Ht 65.0 in | Wt 228.4 lb

## 2016-10-24 DIAGNOSIS — I10 Essential (primary) hypertension: Secondary | ICD-10-CM

## 2016-10-24 DIAGNOSIS — E785 Hyperlipidemia, unspecified: Secondary | ICD-10-CM | POA: Diagnosis not present

## 2016-10-24 DIAGNOSIS — R0789 Other chest pain: Secondary | ICD-10-CM

## 2016-10-24 DIAGNOSIS — R7303 Prediabetes: Secondary | ICD-10-CM | POA: Diagnosis not present

## 2016-10-24 NOTE — Patient Instructions (Signed)
Medication Instructions:  Your physician recommends that you continue on your current medications as directed. Please refer to the Current Medication list given to you today.   Labwork: None ordered  Testing/Procedures: None ordered  Follow-Up: Your physician recommends that you schedule a follow-up appointment in: AS NEEDED   Any Other Special Instructions Will Be Listed Below (If Applicable).     If you need a refill on your cardiac medications before your next appointment, please call your pharmacy.   

## 2016-10-25 ENCOUNTER — Encounter: Payer: Self-pay | Admitting: Family Medicine

## 2016-10-26 ENCOUNTER — Encounter: Payer: Self-pay | Admitting: Family Medicine

## 2016-10-26 ENCOUNTER — Other Ambulatory Visit: Payer: Self-pay | Admitting: Family Medicine

## 2016-10-26 MED ORDER — PROMETHAZINE HCL 25 MG PO TABS: 25.0000 mg | ORAL_TABLET | Freq: Four times a day (QID) | ORAL | 0 refills | Status: DC | PRN

## 2016-10-26 NOTE — Telephone Encounter (Signed)
Phenergan 25mg   1 po qid prn nausea,  #30

## 2016-10-29 ENCOUNTER — Ambulatory Visit (INDEPENDENT_AMBULATORY_CARE_PROVIDER_SITE_OTHER): Payer: BC Managed Care – PPO | Admitting: Gastroenterology

## 2016-10-29 ENCOUNTER — Encounter: Payer: Self-pay | Admitting: Gastroenterology

## 2016-10-29 VITALS — BP 110/80 | HR 72 | Ht 65.0 in | Wt 226.2 lb

## 2016-10-29 DIAGNOSIS — K5732 Diverticulitis of large intestine without perforation or abscess without bleeding: Secondary | ICD-10-CM | POA: Diagnosis not present

## 2016-10-29 DIAGNOSIS — Z8619 Personal history of other infectious and parasitic diseases: Secondary | ICD-10-CM

## 2016-10-29 DIAGNOSIS — B9681 Helicobacter pylori [H. pylori] as the cause of diseases classified elsewhere: Secondary | ICD-10-CM | POA: Diagnosis not present

## 2016-10-29 DIAGNOSIS — K2970 Gastritis, unspecified, without bleeding: Secondary | ICD-10-CM

## 2016-10-29 DIAGNOSIS — K760 Fatty (change of) liver, not elsewhere classified: Secondary | ICD-10-CM | POA: Diagnosis not present

## 2016-10-29 DIAGNOSIS — R935 Abnormal findings on diagnostic imaging of other abdominal regions, including retroperitoneum: Secondary | ICD-10-CM

## 2016-10-29 NOTE — Progress Notes (Signed)
HPI :  56 year old female well known to our clinic, here for follow-up of the issues as outlined below.  Diverticulitis / C diff - initially admitted in April 2017, had recurrence May 2017. Had another episode of divertiulitis, presenting with lower abdominal pain. admitted in 10/10/16 . CT scan showed focal thickening of the sigmoid colon without abscess formation. Treated with IV antibiotics which helped her diverticulitis but then developed C diff. She also had dilated PD to on CT scan 32m, recommended follow up MRCP. She had a microperforation in April 2017 in the sigmoid colon. She thinks since her discharge she is feeling better. No abdominal pain. She remains slightly weak and nauseated. Pain has resolved. She has been given phenergan which she has not yet started. No trouble with bowel habits and blood in the stools. She completed course of PO vancomycin for C diff. Her last colonoscopy was 09/30/15, 2 months prior to her first episode of diverticulitis.  H pylori - EGD done on Sept 2017 showed H pylori gastritis for which I recommended treatment with 14 day course Pylera and Dexilant. She was allergic to flagyl to regimen switched to biaxin and amoxicillin. This did not work and she had a positive stool test on 07/30/16. She had then completed another regimen with levaquin and amoxicillan. She has not had eradicaton testing.   Fatty liver- noted incidentally on her CT scan abdomen - mild steatosis of the liver and dilated PD to 5113m She denies any alcohol use. She thinks she has gained 30 lbs over the past few years. Been seen by endocrinology.    Endoscopic history: EGD 04/2008 - normal esophagus, H pylori gastriits, normal duodenum Colonoscopy in 2012 with a 13m47mescending colon polyp removed but not retrieved. Colonoscopy 09/30/2015 - seven small colon polyps - adenomas, sessile serrated, recall in 3 years, pancolonic diverticulosis, and hemorrhoids EGD 05/17/16 - normal esophagus, benign  gastric polyps, gastritis, normal duodenum. Biopsies show no celiac, gastric polyps benign, H pylori gastritis noted   Past Medical History:  Diagnosis Date  . Allergy   . Asthma    pt has inhaler  . C. difficile colitis   . Cataract    bil eyes  . Diverticulitis   . Edema   . Elevated blood pressure reading without diagnosis of hypertension    "just elevated when I'm in pain" (12/07/2015)  . Fatty liver   . Fibromyalgia   . Ganglion of joint    right wrist  . GERD (gastroesophageal reflux disease)   . H. pylori infection   . Herpes zoster without mention of complication   . Hyperlipidemia   . IBS (irritable bowel syndrome)   . Nontraumatic rupture of Achilles tendon   . Other acute reactions to stress   . Pain in joint, shoulder region   . Pain in joint, upper arm   . Pain in limb   . Personal history of other diseases of digestive system   . Thyroid disease    hyperthyroidism     Past Surgical History:  Procedure Laterality Date  . ABDOMINAL HYSTERECTOMY    . ACHILLES TENDON REPAIR Right 2005  . BUNIONECTOMY Bilateral    Bunionectomy 1983  . GANGLION CYST EXCISION Right    wrist  . LEFT HEART CATH AND CORONARY ANGIOGRAPHY N/A 10/03/2016   Procedure: Left Heart Cath and Coronary Angiography;  Surgeon: ChrBurnell BlanksD;  Location: [facility];  Service: Cardiovascular;  Laterality: N/A;  . MENISCUS REPAIR Right  04/2014  . TUBAL LIGATION    . wisdomteeth extraction     Family History  Problem Relation Age of Onset  . Hypertension Father   . Diabetes Father   . Heart disease Father   . Kidney disease Father     Died, 40  . Leukemia Brother   . Cancer Brother     myleoblastic anemia  . Hyperlipidemia Mother   . Hypertension Mother     Died, 29  . Thyroid disease Mother     Thyroid surgery  . Pernicious anemia Sister   . Thyroid disease Sister     On thyroid Rx  . Lupus Daughter   . Coronary artery disease Brother   . Hypertension Brother    . Colon cancer Neg Hx   . Esophageal cancer Neg Hx   . Rectal cancer Neg Hx   . Stomach cancer Neg Hx   . Pancreatic cancer Neg Hx    Social History  Substance Use Topics  . Smoking status: Never Smoker  . Smokeless tobacco: Never Used  . Alcohol use No   Current Outpatient Prescriptions  Medication Sig Dispense Refill  . acetaminophen (TYLENOL) 500 MG tablet Take 1,000 mg by mouth every 6 (six) hours as needed for moderate pain.     Marland Kitchen albuterol (PROAIR HFA) 108 (90 Base) MCG/ACT inhaler USE 2 PUFFS EVERY 4 HOURS AS NEEDED FOR COUGH, WHEEZE OR SHORTNESS OF BREATH. (Patient taking differently: Inhale 2 puffs into the lungs every 4 (four) hours as needed for wheezing or shortness of breath. ) 8.5 each 2  . albuterol (PROVENTIL) (2.5 MG/3ML) 0.083% nebulizer solution Take 3 mLs (2.5 mg total) by nebulization every 6 (six) hours as needed for wheezing or shortness of breath. 75 mL 3  . ALPRAZolam (XANAX) 0.25 MG tablet Take 1 tablet (0.25 mg total) by mouth 3 (three) times daily as needed. (Patient taking differently: Take 0.125-0.25 mg by mouth 2 (two) times daily as needed for anxiety or sleep. ) 30 tablet 0  . atenolol (TENORMIN) 25 MG tablet Take 1 tablet (25 mg total) by mouth daily. 90 tablet 0  . Blood Glucose Monitoring Suppl (FREESTYLE FREEDOM) KIT 1 Device by Does not apply route daily. 1 each 0  . Calcium Carb-Cholecalciferol (CALCIUM 1000 + D PO) Take 1 tablet by mouth daily.    . cholecalciferol (VITAMIN D) 1000 units tablet Take 1,000 Units by mouth daily.     Marland Kitchen CLINPRO 5000 1.1 % PSTE See admin instructions. Brush teeth nightly  4  . diclofenac sodium (VOLTAREN) 1 % GEL Apply 2 g topically 2 (two) times daily as needed (pain). For pain    . fexofenadine (ALLEGRA) 180 MG tablet Take 1 tablet (180 mg total) by mouth daily. 90 tablet 3  . fluticasone (FLONASE) 50 MCG/ACT nasal spray PLACE 2 SPRAYS INTO BOTH NOSTRILS DAILY. 16 g 4  . Fluticasone-Salmeterol (ADVAIR DISKUS) 250-50  MCG/DOSE AEPB Inhale 1 puff into the lungs 2 (two) times daily. (Patient taking differently: Inhale 1 puff into the lungs daily. ) 60 each 2  . FREESTYLE LITE test strip USE TO CHECK BLOOD SUGAR ONCE A DAY 100 each 6  . Iron Combinations (I.L.X. B-12) ELIX Take 1 each by mouth daily as needed (energy). 1 droplet full    . lidocaine (LIDODERM) 5 % APPLY 1 PATCH ON THE SKIN EVERY 12 HRS REMOVE, DISCARD PATCH WITHIN 12 HRS OR AS DIRECTED (Patient taking differently: APPLY 1 PATCH ON THE SKIN EVERY 12  HRS REMOVE, DISCARD PATCH WITHIN 12 HRS AS NEEDED FOR PAIN) 30 patch 0  . meclizine (ANTIVERT) 32 MG tablet Take 1 tablet (32 mg total) by mouth 3 (three) times daily as needed for dizziness. 30 tablet 0  . methimazole (TAPAZOLE) 5 MG tablet Take 0.5 tablets (2.5 mg total) by mouth daily. 90 tablet 0  . promethazine (PHENERGAN) 25 MG tablet Take 1 tablet (25 mg total) by mouth 4 (four) times daily as needed for nausea or vomiting. 30 tablet 0  . triamcinolone cream (KENALOG) 0.1 % APPLY TO AFFECTED AREA TWICE A DAY (Patient taking differently: APPLY TO AFFECTED AREA TWICE A DAY AS NEEDED FOR RASH) 30 g 0  . valACYclovir (VALTREX) 1000 MG tablet Take 1 tablet (1,000 mg total) by mouth 3 (three) times daily. (Patient taking differently: Take 1,000 mg by mouth. Take 2g twice a day at first sign of fever blister, then take 1g twice daily until symptoms are gone) 30 tablet 5   No current facility-administered medications for this visit.    Allergies  Allergen Reactions  . Aspirin Other (See Comments)    REACTION: anaphylaxis  . Gentamicin Other (See Comments)    Eye drops turned the sclera bright red  . Ibuprofen Other (See Comments)    REACTION: anaphylaxsis  . Nsaids Other (See Comments)    REACTION: anaphylaxis  . Doxycycline Other (See Comments)    REACTION: severe nausea/vomiting  . Influenza A (H1n1) Monoval Vac Other (See Comments)    REACTION: sick for 3 weeks  . Loratadine Other (See Comments)     Fatigue/weakness  . Metronidazole Other (See Comments)    REACTION: red face/swelling  . Ranitidine Hcl Other (See Comments)    REACTION: Lips turned red /peel  . Sulfamethoxazole-Trimethoprim Other (See Comments)    REACTION: face red/peel  . Tramadol Hcl Other (See Comments)    REACTION: paranoid     Review of Systems: All systems reviewed and negative except where noted in HPI.    Ct Abdomen Pelvis W Contrast  Result Date: 10/10/2016 CLINICAL DATA:  Acute onset of left-sided abdominal pain and pressure. Nausea. Recently treated diverticulitis. Initial encounter. EXAM: CT ABDOMEN AND PELVIS WITH CONTRAST TECHNIQUE: Multidetector CT imaging of the abdomen and pelvis was performed using the standard protocol following bolus administration of intravenous contrast. CONTRAST:  151m ISOVUE-300 IOPAMIDOL (ISOVUE-300) INJECTION 61% COMPARISON:  CT of the abdomen and pelvis performed 09/28/2016 FINDINGS: Lower chest: Minimal bibasilar atelectasis is noted. The visualized portions of the mediastinum are unremarkable. Hepatobiliary: The liver is unremarkable in appearance. The gallbladder is unremarkable in appearance. The common bile duct remains normal in caliber. Pancreas: There is dilatation of the pancreatic duct to 5 mm, of uncertain significance. Would correlate with pancreatic lab values. The pancreas is otherwise grossly unremarkable. Spleen: The spleen is unremarkable in appearance. Adrenals/Urinary Tract: The adrenal glands are unremarkable in appearance. The kidneys are within normal limits. There is no evidence of hydronephrosis. No renal or ureteral stones are identified. No perinephric stranding is seen. Stomach/Bowel: The stomach is unremarkable in appearance. The small bowel is within normal limits. The appendix is normal in caliber, without evidence of appendicitis. Scattered diverticulosis is noted along the entirety of the colon. There is mild focal wall thickening at the mid to  distal sigmoid colon, with associated soft tissue inflammation, compatible with persistent or recurrent diverticulitis. No definite perforation or abscess formation is seen. Mild inflammation again tracks superiorly at the left lower quadrant. Trace free fluid  is seen within the pelvis. This is similar in appearance to the prior study. Vascular/Lymphatic: The abdominal aorta is unremarkable in appearance. The inferior vena cava is grossly unremarkable. No retroperitoneal lymphadenopathy is seen. No pelvic sidewall lymphadenopathy is identified. Reproductive: The bladder is mildly distended and grossly unremarkable. The patient is status post hysterectomy. No suspicious adnexal masses are seen. Other: No additional soft tissue abnormalities are seen. Musculoskeletal: No acute osseous abnormalities are identified. The visualized musculature is unremarkable in appearance. IMPRESSION: 1. Persistent or recurrent acute diverticulitis at the mid to distal sigmoid colon, with focal wall thickening and soft tissue inflammation. Trace free fluid within the pelvis. Mild soft tissue inflammation tracks superiorly at the left lower quadrant. No definite evidence of perforation or abscess formation at this time. The appearance is similar to the prior study. 2. Scattered diverticulosis along the entirety of the colon. 3. Dilatation of the pancreatic duct to 5 mm, of uncertain significance. Would correlate with pancreatic lab values; MRCP could be considered for further evaluation as deemed clinically appropriate. Electronically Signed   By: Garald Balding M.D.   On: 10/10/2016 17:34   Dg Chest Port 1 View  Result Date: 10/14/2016 CLINICAL DATA:  Post insertion to confirm line placement. Hx of asthma, left heart cath and coronary angiography, hysterectomy. Nonsmoker. EXAM: PORTABLE CHEST 1 VIEW COMPARISON:  10/10/2009 FINDINGS: There is no central line or other device visible on the included field of view. Cardiac silhouette is  normal in size. No mediastinal or hilar masses. No evidence of adenopathy. Clear lungs. No pleural effusion.  No pneumothorax. Skeletal structures are unremarkable. IMPRESSION: 1. No active disease of the chest. 2. No evidence of a central line or other device on the included field of view. Electronically Signed   By: Lajean Manes M.D.   On: 10/14/2016 11:13   Dg Abd 2 Views  Result Date: 10/11/2016 CLINICAL DATA:  Abdominal pain and distention. EXAM: ABDOMEN - 2 VIEW COMPARISON:  CT scan from yesterday. FINDINGS: Upright film shows no evidence for intraperitoneal free air. Supine abdomen shows no gaseous small bowel dilatation. Enteric contrast from yesterday's CT scan is now in the nondilated colon. Visualized bony anatomy unremarkable. IMPRESSION: No evidence for intraperitoneal free air or bowel obstruction. Electronically Signed   By: Misty Stanley M.D.   On: 10/11/2016 20:46   Dg Abd Portable 1v  Result Date: 10/13/2016 CLINICAL DATA:  Abdomen distension EXAM: PORTABLE ABDOMEN - 1 VIEW COMPARISON:  10/11/2016 FINDINGS: Nonobstructed bowel-gas pattern. Clearing of contrast from the colon. Calcified pelvic phleboliths. IMPRESSION: Nonobstructed bowel-gas pattern with clearing of contrast from the colon Electronically Signed   By: Donavan Foil M.D.   On: 10/13/2016 21:41   Lab Results  Component Value Date   WBC 5.6 10/23/2016   HGB 13.2 10/23/2016   HCT 40.3 10/23/2016   MCV 88.0 10/23/2016   PLT 354.0 10/23/2016    Lab Results  Component Value Date   CREATININE 0.88 10/23/2016   BUN 9 10/23/2016   NA 143 10/23/2016   K 4.1 10/23/2016   CL 105 10/23/2016   CO2 32 10/23/2016    Lab Results  Component Value Date   ALT 23 10/23/2016   AST 19 10/23/2016   ALKPHOS 69 10/23/2016   BILITOT 0.4 10/23/2016      Physical Exam: BP 110/80 (BP Location: Left Arm, Patient Position: Sitting, Cuff Size: Normal)   Pulse 72   Ht _0  (1.651 m)   Wt 226 lb 4 oz (  102.6 kg)   BMI 37.65  kg/m  Constitutional: Pleasant,well-developed, female in no acute distress. HEENT: Normocephalic and atraumatic. Conjunctivae are normal. No scleral icterus. Neck supple.  Cardiovascular: Normal rate, regular rhythm.  Pulmonary/chest: Effort normal and breath sounds normal. No wheezing, rales or rhonchi. Abdominal: Soft, nondistended, nontender. There are no masses palpable. No hepatomegaly. Extremities: no edema Lymphadenopathy: No cervical adenopathy noted. Neurological: Alert and oriented to person place and time. Skin: Skin is warm and dry. No rashes noted. Psychiatric: Normal mood and affect. Behavior is normal.   ASSESSMENT AND PLAN: 56 year old female here to be reassessed for the following issues:  Diverticulitis - she's had 3 episodes of diverticulitis in the past 2 years, 2 of which required admission, one of which associated with microperforation, all the sigmoid colon in the same area. She had a colonoscopy just prior to her first episode of diverticulitis which did not show any concerning pathology in this area other than diverticulosis. Given her history over the past 2 years I think she is high risk for recurrent diverticulitis. We discussed management options moving forward, namely elective sigmoid resection, to prevent future recurrence of this. Following discussion she agreed to surgical evaluation with Dr. Marcello Moores, I will place this referral. In the interim I don't think she needs another colonoscopy prior to surgery unless requested by Dr. Marcello Moores. She should be on a daily fiber supplement that time and contact us with recurrence of symptoms.  H pylori gastritis - H. pylori was not eradicated with clarithromycin and amoxicillin. We then treated her with Levaquin and amoxicillin given her Flagyl allergy. Now that she is off antibiotics I asked her to go to the lab for an H. pylori stool antigen test to confirm eradication.   History of C. Difficile - no residual symptoms at  this time, related to recent antibiotic use and hospitalization, she should monitor for recurrence of symptoms and contact us if this occurs.   Abnormal CT of the abdomen - she has a mildly dilated pancreatic duct the most recent CT, prior CT scans have not shown this. She is asymptomatic. I think this is unlikely to represent concerning pathology however radiology recommending a follow-up MRCP to ensure no small bowel pancreatic lesion causing this. We will order MRCP and let her know the results.  Fatty liver - noted incidentally on CT scan of the abdomen. Her LFTs are normal. I suspect this is likely related to her weight gain. I counseled her on the spectrum of fatty liver disease and that she needs to have her LFTs checked once yearly. Recommend goal weight loss to normalize BMI and to minimize her alcohol use. Encouraged her to drink coffee routinely which can help prevent fibrotic changes.  Winnfield Cellar, MD [facility] Pager [phone]  CC: Carollee Herter, Alferd Apa, *

## 2016-10-29 NOTE — Patient Instructions (Signed)
If you are age 57 or older, your body mass index should be between 23-30. Your Body mass index is 37.65 kg/m. If this is out of the aforementioned range listed, please consider follow up with your Primary Care Provider.  If you are age 64 or younger, your body mass index should be between 19-25. Your Body mass index is 37.65 kg/m. If this is out of the aformentioned range listed, please consider follow up with your Primary Care Provider.   Your physician has requested that you go to the basement for the following lab in a few weeks.  Stool Antigen   You have been scheduled for an MRI at [facility] on Thursday, March 15th. Your appointment time is 7:00am. Please arrive 15 minutes prior to your appointment time for registration purposes. Please make certain not to have anything to eat or drink after midnight the night prior to your test. In addition, if you have any metal in your body, have a pacemaker or defibrillator, please be sure to let your ordering physician know. This test typically takes 45 minutes to 1 hour to complete.  You will be referred to [facility] to see Dr. Marcello Moores. Their office will contact you with an appointment.  Thank you.

## 2016-10-30 ENCOUNTER — Telehealth: Payer: Self-pay | Admitting: Gastroenterology

## 2016-10-31 NOTE — Telephone Encounter (Signed)
OV notes, ins card, labs and procedures faxed to [facility]. They will contact pt with an appointment and then inform us via fax of appt date/time. Pt was informed of this at her appt on the 12th.

## 2016-11-01 ENCOUNTER — Telehealth: Payer: Self-pay | Admitting: Gastroenterology

## 2016-11-01 ENCOUNTER — Ambulatory Visit (HOSPITAL_COMMUNITY)
Admission: RE | Admit: 2016-11-01 | Discharge: 2016-11-01 | Disposition: A | Discharge status: Home / Self Care | Payer: BC Managed Care – PPO | Source: Ambulatory Visit | Attending: Gastroenterology | Admitting: Gastroenterology

## 2016-11-01 ENCOUNTER — Other Ambulatory Visit: Payer: Self-pay | Admitting: Gastroenterology

## 2016-11-01 DIAGNOSIS — K5732 Diverticulitis of large intestine without perforation or abscess without bleeding: Secondary | ICD-10-CM

## 2016-11-01 DIAGNOSIS — Z8619 Personal history of other infectious and parasitic diseases: Secondary | ICD-10-CM

## 2016-11-01 DIAGNOSIS — R935 Abnormal findings on diagnostic imaging of other abdominal regions, including retroperitoneum: Secondary | ICD-10-CM

## 2016-11-01 DIAGNOSIS — B9681 Helicobacter pylori [H. pylori] as the cause of diseases classified elsewhere: Secondary | ICD-10-CM

## 2016-11-01 DIAGNOSIS — K2970 Gastritis, unspecified, without bleeding: Secondary | ICD-10-CM

## 2016-11-01 DIAGNOSIS — R933 Abnormal findings on diagnostic imaging of other parts of digestive tract: Secondary | ICD-10-CM | POA: Diagnosis present

## 2016-11-01 DIAGNOSIS — K760 Fatty (change of) liver, not elsewhere classified: Secondary | ICD-10-CM | POA: Insufficient documentation

## 2016-11-01 MED ORDER — GADOBENATE DIMEGLUMINE 529 MG/ML IV SOLN
20.0000 mL | Freq: Once | INTRAVENOUS | Status: AC | PRN
  Administered 2016-11-01: 20 mL via INTRAVENOUS

## 2016-11-01 NOTE — Telephone Encounter (Signed)
Spoke to patient, she is feeling well today. Looking back at last office note, the referral to [facility] is for elective surgery. Reassured patient that April 5th to see the general surgeon would be okay. She is instructed to call our office if she starts having symptoms of diverticulitis.

## 2016-11-01 NOTE — Telephone Encounter (Signed)
Pt has an appt with Dr. Marcello Moores with [facility] on 11/22/16 at [place]. Pt was informed by their office.

## 2016-11-05 ENCOUNTER — Encounter: Payer: Self-pay | Admitting: Gastroenterology

## 2016-11-07 NOTE — Telephone Encounter (Signed)
I don't see any follow up appointment scheduled with me on 11/23/16, she doesn't need to see me just yet. She does need to keep her appointment with Dr. Marcello Moores to discuss potential surgery for her recurrent diverticulitis. Otherwise yes she needs to submit stool study for H pylori eradication. thanks

## 2016-11-07 NOTE — Telephone Encounter (Signed)
-----   Message from Manus Gunning, MD sent at 11/05/2016  2:58 PM EDT ----- Caryl Pina can you please relay the following to this patient: - MRCP done to evaluate dilated pancreatic duct noted on CT scan. The pancreas is normal on this exam without any ductal dilation, which is excellent news, no mass lesion or new concerning findings. She has fatty liver noted on this study which was noted on her prior exam and we have already discussed this with her. Can you please reassure her in regards to the pancreas findings. Thanks

## 2016-11-07 NOTE — Telephone Encounter (Signed)
Pt informed of normal results. She wants to know if she needs to keep appt that is scheduled for 11/23/16? She does need to submit her stool study for H.Pylori still.

## 2016-11-07 NOTE — Telephone Encounter (Signed)
Left message for pt to return call.

## 2016-11-12 ENCOUNTER — Other Ambulatory Visit: Payer: BC Managed Care – PPO

## 2016-11-12 ENCOUNTER — Telehealth: Payer: Self-pay | Admitting: Gastroenterology

## 2016-11-12 DIAGNOSIS — B9681 Helicobacter pylori [H. pylori] as the cause of diseases classified elsewhere: Secondary | ICD-10-CM

## 2016-11-12 DIAGNOSIS — Z8619 Personal history of other infectious and parasitic diseases: Secondary | ICD-10-CM

## 2016-11-12 DIAGNOSIS — K2970 Gastritis, unspecified, without bleeding: Secondary | ICD-10-CM

## 2016-11-12 DIAGNOSIS — R935 Abnormal findings on diagnostic imaging of other abdominal regions, including retroperitoneum: Secondary | ICD-10-CM

## 2016-11-12 DIAGNOSIS — K5732 Diverticulitis of large intestine without perforation or abscess without bleeding: Secondary | ICD-10-CM

## 2016-11-12 NOTE — Telephone Encounter (Signed)
Patient was able to submit a stool sample, restarted her Dexilant and is feeling better, actually is going to work now. She said that the stool looked normal once again. Told her we would let her know once results come back.

## 2016-11-12 NOTE — Telephone Encounter (Signed)
She had a normal cardiac cath last month with normal EF, I doubt her chest pain is related to her heart, and more likely reflux if stopping her Dexilant. She should submit stool sample if possible and then resume Dexliant once this is done. She can take over the counter gaviscon or Maalox in the interim until she submits stool study to see if this will help.   Otherwise, not sure if her other abdominal pain feels like her prior diverticulitis pain or not. Sounds like this is improved but if she has symptoms concerning for diverticulitis she should let us know. Thanks

## 2016-11-12 NOTE — Telephone Encounter (Signed)
Patient called reports that she had right sided abdominal pain starting Saturday and Sunday. She reports had a bm on Sunday and last part of stool was black, did not see any blood, but patient states it had a "bloody odor". Pain in abdomen eased up after having bm. Denies taking any Peptobismol. She said that this morning was having midsternal chest pain, 8/10. She took her atenolol around 7:00 am and now pain is 4/10. Patient states she had just been to the ED and worked up for chest pain in February. She still has not done her stool sample for the recheck of H. Pylori, therefore has not had her Dexilant since February.  She also has her [facility] consult with Dr. Marcello Moores on 4/5. I asked the patient if it was possible to bring stool sample in today, then could resume her Dexilant. She said it was possible to do that today. I also told her I would send this information to Dr. Havery Moros and call her back with his recommendations.

## 2016-11-12 NOTE — Telephone Encounter (Signed)
Okay thanks for the update 

## 2016-11-13 LAB — HELICOBACTER PYLORI  SPECIAL ANTIGEN: H. PYLORI Antigen: DETECTED

## 2016-11-14 ENCOUNTER — Other Ambulatory Visit: Payer: Self-pay

## 2016-11-14 DIAGNOSIS — A048 Other specified bacterial intestinal infections: Secondary | ICD-10-CM

## 2016-11-18 ENCOUNTER — Other Ambulatory Visit: Payer: Self-pay | Admitting: Gastroenterology

## 2016-11-22 ENCOUNTER — Other Ambulatory Visit: Payer: Self-pay | Admitting: General Surgery

## 2016-11-22 DIAGNOSIS — K5732 Diverticulitis of large intestine without perforation or abscess without bleeding: Secondary | ICD-10-CM

## 2016-11-22 NOTE — H&P (Signed)
History of Present Illness Emily Ruff MD; 08/25/1094 10:11 AM) The patient is a 56 year old female who presents with diverticulitis. 56 year old female who presents to the office for evaluation of surgical correction of diverticular disease. She states that her first episode of diverticulitis was in April 2017. CT showed a possible microperforation. This was treated with antibiotics. She then developed a recurrence in February 2018. This did not improve with oral antibiotics and she was hospitalized for several days on IV antibiotics. Since that time she has been slow to recover. She continues to have nausea and abdominal pain as well as frequent diarrhea. Her last colonoscopy was in early 2017. She states that her current pain has resolved.   Past Surgical History Mammie Lorenzo, LPN; 0/11/5407 8:11 AM) Colon Polyp Removal - Colonoscopy Foot Surgery Bilateral. Hysterectomy (not due to cancer) - Partial Knee Surgery Right. Oral Surgery  Diagnostic Studies History Mammie Lorenzo, LPN; 04/20/4781 9:56 AM) Colonoscopy 1-5 years ago Mammogram within last year Pap Smear 1-5 years ago  Allergies Mammie Lorenzo, LPN; 09/20/3084 5:78 AM) Aspirin *ANALGESICS - NonNarcotic* Anaphylaxis. NSAIDs Anaphylaxis. Gentamicin Sulfate *AMINOGLYCOSIDES* redness Doxycycline *DERMATOLOGICALS* Nausea, Vomiting. Influenza A (H1N1) Monoval PF *VACCINES* weakness Loratadine *ANTIHISTAMINES* weakness Flagyl *ANTI-INFECTIVE AGENTS - MISC.* redness Zantac *ULCER DRUGS* Swollen lips. Septra *ANTI-INFECTIVE AGENTS - MISC.* Swollen lips. redness TraMADol HCl *ANALGESICS - OPIOID* paranoia  Medication History Mammie Lorenzo, LPN; 11/24/9627 5:28 AM) ALPRAZolam (0.25MG  Tablet, Oral) Active. Advair Diskus (250-50MCG/DOSE Aero Pow Br Act, Inhalation) Active. Albuterol Sulfate ((2.5 MG/3ML)0.083% Nebulized Soln, Inhalation) Active. Atenolol (25MG  Tablet, Oral) Active. Dexilant (60MG  Capsule  DR, Oral) Active. Fluticasone Propionate (50MCG/ACT Suspension, Nasal) Active. Lidocaine (5% Patch, External) Active. Promethazine HCl (25MG  Tablet, Oral) Active. ValACYclovir HCl (1GM Tablet, Oral) Active. Voltaren (1% Gel, Transdermal) Active. Calcium-Vitamin D (600-400MG -UNIT Tablet, Oral) Active. Vitamin D (Cholecalciferol) (1000UNIT Capsule, Oral) Active. Allegra (180MG  Tablet, Oral) Active. Flonase (50MCG/ACT Suspension, Nasal) Active. Iron Combinations (Oral) Active. Tapazole (5MG  Tablet, Oral) Active. Medications Reconciled  Social History Mammie Lorenzo, LPN; 11/19/3242 0:10 AM) Caffeine use Carbonated beverages, Coffee, Tea. No alcohol use No drug use Tobacco use Never smoker.  Family History Mammie Lorenzo, LPN; 09/26/2534 6:44 AM) Alcohol Abuse Brother. Arthritis Mother. Cancer Brother. Colon Polyps Sister. Depression Daughter. Diabetes Mellitus Brother, Father, Sister. Heart Disease Brother, Father, Mother, Sister. Heart disease in female family member before age 76 Heart disease in female family member before age 24 Hypertension Brother, Father, Mother, Sister. Kidney Disease Father. Migraine Headache Daughter, Sister. Respiratory Condition Mother. Thyroid problems Sister.  Pregnancy / Birth History Mammie Lorenzo, LPN; 0/10/4740 5:95 AM) Age at menarche 30 years. Age of menopause 38-50 Gravida 2 Length (months) of breastfeeding 3-6 Maternal age 30-35 Para 2  Other Problems Mammie Lorenzo, LPN; 01/21/8755 4:33 AM) Anxiety Disorder Asthma Back Pain Chest pain Diverticulosis Gastroesophageal Reflux Disease Hemorrhoids Migraine Headache Oophorectomy Right. Other disease, cancer, significant illness Thyroid Disease     Review of Systems Claiborne Billings Dockery LPN; 09/28/5186 4:16 AM) General Present- Appetite Loss and Chills. Not Present- Fatigue, Fever, Night Sweats, Weight Gain and Weight Loss. Skin Not Present-  Change in Wart/Mole, Dryness, Hives, Jaundice, New Lesions, Non-Healing Wounds, Rash and Ulcer. HEENT Present- Seasonal Allergies and Wears glasses/contact lenses. Not Present- Earache, Hearing Loss, Hoarseness, Nose Bleed, Oral Ulcers, Ringing in the Ears, Sinus Pain, Sore Throat, Visual Disturbances and Yellow Eyes. Respiratory Not Present- Bloody sputum, Chronic Cough, Difficulty Breathing, Snoring and Wheezing. Breast Not Present- Breast Mass, Breast Pain, Nipple Discharge and Skin Changes. Cardiovascular Not Present-  Chest Pain, Difficulty Breathing Lying Down, Leg Cramps, Palpitations, Rapid Heart Rate, Shortness of Breath and Swelling of Extremities. Gastrointestinal Present- Bloating, Change in Bowel Habits, Gets full quickly at meals, Indigestion, Nausea and Rectal Pain. Not Present- Abdominal Pain, Bloody Stool, Chronic diarrhea, Constipation, Difficulty Swallowing, Excessive gas, Hemorrhoids and Vomiting. Female Genitourinary Not Present- Frequency, Nocturia, Painful Urination, Pelvic Pain and Urgency. Musculoskeletal Present- Joint Pain, Muscle Pain, Muscle Weakness and Swelling of Extremities. Not Present- Back Pain and Joint Stiffness. Neurological Not Present- Decreased Memory, Fainting, Headaches, Numbness, Seizures, Tingling, Tremor, Trouble walking and Weakness. Psychiatric Present- Anxiety and Change in Sleep Pattern. Not Present- Bipolar, Depression, Fearful and Frequent crying. Endocrine Present- Cold Intolerance. Not Present- Excessive Hunger, Hair Changes, Heat Intolerance, Hot flashes and New Diabetes. Hematology Not Present- Blood Thinners, Easy Bruising, Excessive bleeding, Gland problems, HIV and Persistent Infections.  Vitals Claiborne Billings Dockery LPN; 02/17/6966 8:93 AM) 11/22/2016 9:21 AM Weight: 229.6 lb Height: 65in Body Surface Area: 2.1 m Body Mass Index: 38.21 kg/m  Temp.: 97.77F(Oral)  Pulse: 65 (Regular)  BP: 126/82 (Sitting, Left Arm,  Standard)      Physical Exam Emily Ruff MD; 03/20/174 10:12 AM)  General Mental Status-Alert. General Appearance-Not in acute distress. Build & Nutrition-Well nourished. Posture-Normal posture. Gait-Normal.  Head and Neck Head-normocephalic, atraumatic with no lesions or palpable masses. Trachea-midline.  Chest and Lung Exam Chest and lung exam reveals -on auscultation, normal breath sounds, no adventitious sounds and normal vocal resonance.  Cardiovascular Cardiovascular examination reveals -normal heart sounds, regular rate and rhythm with no murmurs and no digital clubbing, cyanosis, edema, increased warmth or tenderness.  Abdomen Inspection Inspection of the abdomen reveals - No Hernias. Palpation/Percussion Palpation and Percussion of the abdomen reveal - Soft, Non Tender, No Rigidity (guarding), No hepatosplenomegaly and No Palpable abdominal masses.  Neurologic Neurologic evaluation reveals -alert and oriented x 3 with no impairment of recent or remote memory, normal attention span and ability to concentrate, normal sensation and normal coordination.  Musculoskeletal Normal Exam - Bilateral-Upper Extremity Strength Normal and Lower Extremity Strength Normal.    Assessment & Plan Emily Ruff MD; 1/0/2585 9:48 AM)  DIVERTICULITIS, COLON (K57.32) Impression: 56 year old female who presents to the office with recurrent diverticulitis. She had one episode in April 2017 which was treated as an outpatient. She had another episode in February 2018 which was treated with hospitalization. One of these episodes did show possible evidence of a microperforation. Given the appearance on the CT scan, I think she is likely to have another recurrence and I have recommended elective sigmoid resection to be performed in the beginning of June. We have discussed this in detail. I will get a CT scan of her pelvis to evaluate for the amount of inflammation that  remains.

## 2016-11-23 ENCOUNTER — Ambulatory Visit: Payer: BC Managed Care – PPO | Admitting: Gastroenterology

## 2016-11-28 ENCOUNTER — Telehealth (INDEPENDENT_AMBULATORY_CARE_PROVIDER_SITE_OTHER): Payer: Self-pay | Admitting: Orthopedic Surgery

## 2016-11-28 ENCOUNTER — Other Ambulatory Visit: Payer: BC Managed Care – PPO

## 2016-11-28 ENCOUNTER — Ambulatory Visit
Admission: RE | Admit: 2016-11-28 | Discharge: 2016-11-28 | Disposition: A | Discharge status: Home / Self Care | Payer: BC Managed Care – PPO | Source: Ambulatory Visit | Attending: General Surgery | Admitting: General Surgery

## 2016-11-28 DIAGNOSIS — K5732 Diverticulitis of large intestine without perforation or abscess without bleeding: Secondary | ICD-10-CM

## 2016-11-28 MED ORDER — IOPAMIDOL (ISOVUE-300) INJECTION 61%
125.0000 mL | Freq: Once | INTRAVENOUS | Status: AC | PRN
  Administered 2016-11-28: 125 mL via INTRAVENOUS

## 2016-11-28 NOTE — Telephone Encounter (Signed)
Patient states she's having severe right knee pain, with swelling & giving out out her twice this morning. She also states it's the knee she had surgery on a year ago.

## 2016-11-28 NOTE — Telephone Encounter (Signed)
Patient will be worked in to see Dr Marlou Sa in the morning

## 2016-11-29 ENCOUNTER — Ambulatory Visit (INDEPENDENT_AMBULATORY_CARE_PROVIDER_SITE_OTHER): Payer: BC Managed Care – PPO

## 2016-11-29 ENCOUNTER — Ambulatory Visit (INDEPENDENT_AMBULATORY_CARE_PROVIDER_SITE_OTHER): Payer: Self-pay | Admitting: Orthopedic Surgery

## 2016-11-29 ENCOUNTER — Encounter (INDEPENDENT_AMBULATORY_CARE_PROVIDER_SITE_OTHER): Payer: Self-pay | Admitting: Orthopedic Surgery

## 2016-11-29 ENCOUNTER — Ambulatory Visit (INDEPENDENT_AMBULATORY_CARE_PROVIDER_SITE_OTHER): Payer: BC Managed Care – PPO | Admitting: Orthopedic Surgery

## 2016-11-29 DIAGNOSIS — M25561 Pain in right knee: Secondary | ICD-10-CM

## 2016-11-29 DIAGNOSIS — M542 Cervicalgia: Secondary | ICD-10-CM | POA: Diagnosis not present

## 2016-11-29 DIAGNOSIS — M25512 Pain in left shoulder: Secondary | ICD-10-CM | POA: Diagnosis not present

## 2016-11-30 NOTE — Progress Notes (Signed)
Office Visit Note   Patient: Emily Osborne           Date of Birth: 1961-07-25           MRN: [id number] Visit Date: 11/29/2016 Requested by: Emily Held, DO [address] [place], [place] [address] PCP: Emily Held, DO  Subjective: Chief Complaint  Patient presents with  . Right Knee - Pain    HPI: Emily Osborne is a 42 show patient with right knee pain.  Been going on for a week.  She reports pain with activity with swelling some weakness and giving way.  Denies any popping or locking.  She will not wake up with the pain.  Reports mostly medial sided pain.  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  She had right knee arthroscopy and partial lateral meniscectomy in September 2015.  She takes Tylenol and dalteparin gel for the pain.  The pain is worse at night.  She denies any  discrete history of the injury.  She states the knee buckled on Wednesday.  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  Has a history of left knee injection which helped.  Patient also describes "shoulder pain".  He did get better at the end of last week.  She used heat and Voltaren gel for that problem.  She states that the pain was radiating to the forearm.  It also radiated up into her neck.  Denies any weakness or mechanical symptoms in the shoulder.              ROS: All systems reviewed are negative as they relate to the chief complaint within the history of present illness.  Patient denies  fevers or chills.   Assessment & Plan: Visit Diagnoses:  1. Right knee pain, unspecified chronicity   2. Left shoulder pain, unspecified chronicity   3. Neck pain     Plan: Impression is right knee pain with likely exacerbation of existing arthritis.  We will inject the knee today.  In regards to the shoulder and neck this appears more like radiculopathy primarily because her symptoms are improved when she raises the left arm up.  I  think we could consider further workup of the neck if needed.  Follow-Up Instructions: Return if symptoms worsen or fail to improve.   Orders:  Orders Placed This Encounter  Procedures  . XR KNEE 3 VIEW RIGHT  . XR Shoulder Left  . XR Cervical Spine 2 or 3 views   No orders of the defined types were placed in this encounter.     Procedures: No procedures performed   Clinical Data: No additional findings.  Objective: Vital Signs: There were no vitals taken for this visit.  Physical Exam:   Constitutional: Patient appears well-developed HEENT:  Head: Normocephalic Eyes:EOM are normal Neck: Normal range of motion Cardiovascular: Normal rate Pulmonary/chest: Effort normal Neurologic: Patient is alert Skin: Skin is warm Psychiatric: Patient has normal mood and affect    Ortho Exam: Orthopedic exam demonstrates right knee no effusion medial joint line tenderness and equivocal McMurray compression testing intact extensor mechanism palpable pedal pulses and no groin pain with internal/external rotation of the leg.  Examination of the left shoulder demonstrates full active and passive range of motion with no course grinding or crepitus or rotator cuff weakness on exam.  Cervical spine range of motion is full.  No paresthesias  C5-T1.  Radial pulses intact bilaterally.  Specialty Comments:  No specialty comments available.  Imaging: Xr Cervical Spine 2 Or 3 Views  Result Date: 11/30/2016 AP lateral cervical spine reviewed.  Slight loss of lordosis is present.  Only minimal degenerative disc disease is present throughout the cervical spine.  Mild facet arthritis present.  Xr Knee 3 View Right  Result Date: 11/30/2016 AP lateral merchant right knee reviewed.  Mild degenerative changes present.  This is worse in the lateral compartment.  No bone-on-bone changes noted.  Patella well centered.  Xr Shoulder Left  Result Date: 11/30/2016 AP outlet and axillary lateral left  shoulder reviewed.  No glenohumeral arthritis is present.  Mild acromioclavicular degenerative changes noted.  The visualized lung fields clear.  Shoulder is located.  No fractures present.  No other soft tissue calcifications.    PMFS History: Patient Active Problem List   Diagnosis Date Noted  . Diverticulitis 10/10/2016  . Lower abdominal pain 09/29/2016  . Palpitations 02/06/2016  . Mild diastolic dysfunction 65/99/3570  . Graves disease 01/02/2016  . Acute diverticulitis 12/07/2015  . Essential hypertension 12/07/2015  . Acute bacterial sinusitis 10/04/2015  . History of colonic polyps 11/15/2014  . Left shoulder pain 09/28/2013  . Left-sided weakness 09/16/2013  . Obesity (BMI 30-39.9) 09/02/2013  . Myalgia 06/21/2011  . POSTMENOPAUSAL STATUS 09/07/2010  . PAIN IN JOINT, MULTIPLE SITES 12/27/2009  . LOW BACK PAIN, CHRONIC 12/27/2009  . FATIGUE 12/27/2009  . VAGINITIS 11/22/2009  . DYSPEPSIA&OTHER [facility] DISORDERS FUNCTION STOMACH 09/01/2009  . NAUSEA 08/03/2009  . FLATULENCE-GAS-BLOATING 08/03/2009  . ABDOMINAL PAIN RIGHT UPPER QUADRANT 07/06/2009  . SUPRAPUBIC PAIN 07/06/2009  . GANGLION CYST, HX OF 07/06/2009  . BACK PAIN 05/04/2009  . Hyperlipidemia 09/16/2008  . UNSPECIFIED MYALGIA AND MYOSITIS 09/16/2008  . Precordial pain 07/27/2008  . GERD 06/08/2008  . HYPERGLYCEMIA, FASTING 06/08/2008  . HELICOBACTER PYLORI GASTRITIS, HX OF 04/05/2008  . OTHER ACUTE REACTIONS TO STRESS 03/16/2008  . ABDOMINAL PAIN, EPIGASTRIC 03/16/2008  . SHOULDER PAIN, RIGHT 10/01/2007  . ELBOW PAIN, RIGHT 10/01/2007  . ELEVATED BLOOD PRESSURE 04/29/2007  . Pain in limb 04/14/2007  . DEPENDENT EDEMA, RIGHT LEG 04/14/2007  . SHINGLES 04/08/2007  . Asthma 03/18/2007  . GANGLION CYST, WRIST, RIGHT 03/18/2007  . BUNIONECTOMY, HX OF 03/18/2007   Past Medical History:  Diagnosis Date  . Allergy   . Asthma    pt has inhaler  . C. difficile colitis   . Cataract    bil eyes  .  Diverticulitis   . Edema   . Elevated blood pressure reading without diagnosis of hypertension    "just elevated when I'm in pain" (12/07/2015)  . Fatty liver   . Fibromyalgia   . Ganglion of joint    right wrist  . GERD (gastroesophageal reflux disease)   . H. pylori infection   . Herpes zoster without mention of complication   . Hyperlipidemia   . IBS (irritable bowel syndrome)   . Nontraumatic rupture of Achilles tendon   . Other acute reactions to stress   . Pain in joint, shoulder region   . Pain in joint, upper arm   . Pain in limb   . Personal history of other diseases of digestive system   . Thyroid disease    hyperthyroidism    Family History  Problem Relation Age of Onset  . Hypertension Father   . Diabetes Father   . Heart disease Father   . Kidney disease Father  Died, 45  . Leukemia Brother   . Cancer Brother     myleoblastic anemia  . Hyperlipidemia Mother   . Hypertension Mother     Died, 74  . Thyroid disease Mother     Thyroid surgery  . Pernicious anemia Sister   . Thyroid disease Sister     On thyroid Rx  . Lupus Daughter   . Coronary artery disease Brother   . Hypertension Brother   . Colon cancer Neg Hx   . Esophageal cancer Neg Hx   . Rectal cancer Neg Hx   . Stomach cancer Neg Hx   . Pancreatic cancer Neg Hx     Past Surgical History:  Procedure Laterality Date  . ABDOMINAL HYSTERECTOMY    . ACHILLES TENDON REPAIR Right 2005  . BUNIONECTOMY Bilateral    Bunionectomy 1983  . GANGLION CYST EXCISION Right    wrist  . LEFT HEART CATH AND CORONARY ANGIOGRAPHY N/A 10/03/2016   Procedure: Left Heart Cath and Coronary Angiography;  Surgeon: Burnell Blanks, MD;  Location: [facility];  Service: Cardiovascular;  Laterality: N/A;  . MENISCUS REPAIR Right 04/2014  . TUBAL LIGATION    . wisdomteeth extraction     Social History   Occupational History  . Nurse/teacher    Social History Main Topics  . Smoking status: Never  Smoker  . Smokeless tobacco: Never Used  . Alcohol use No  . Drug use: No  . Sexual activity: Not Currently    Partners: Male

## 2016-12-05 ENCOUNTER — Ambulatory Visit (INDEPENDENT_AMBULATORY_CARE_PROVIDER_SITE_OTHER): Payer: Self-pay | Admitting: Orthopedic Surgery

## 2016-12-13 ENCOUNTER — Ambulatory Visit (INDEPENDENT_AMBULATORY_CARE_PROVIDER_SITE_OTHER): Payer: BC Managed Care – PPO | Admitting: Infectious Disease

## 2016-12-13 ENCOUNTER — Encounter: Payer: Self-pay | Admitting: Infectious Disease

## 2016-12-13 VITALS — BP 123/83 | HR 72 | Temp 98.6°F | Wt 229.0 lb

## 2016-12-13 DIAGNOSIS — Z113 Encounter for screening for infections with a predominantly sexual mode of transmission: Secondary | ICD-10-CM | POA: Diagnosis not present

## 2016-12-13 DIAGNOSIS — A048 Other specified bacterial intestinal infections: Secondary | ICD-10-CM | POA: Diagnosis not present

## 2016-12-13 DIAGNOSIS — K5792 Diverticulitis of intestine, part unspecified, without perforation or abscess without bleeding: Secondary | ICD-10-CM

## 2016-12-13 DIAGNOSIS — A0472 Enterocolitis due to Clostridium difficile, not specified as recurrent: Secondary | ICD-10-CM

## 2016-12-13 HISTORY — DX: Enterocolitis due to Clostridium difficile, not specified as recurrent: A04.72

## 2016-12-13 HISTORY — DX: Encounter for screening for infections with a predominantly sexual mode of transmission: Z11.3

## 2016-12-13 HISTORY — DX: Other specified bacterial intestinal infections: A04.8

## 2016-12-13 MED ORDER — RIFABUTIN 150 MG PO CAPS: 300.0000 mg | ORAL_CAPSULE | Freq: Every day | ORAL | 0 refills | Status: AC

## 2016-12-13 MED ORDER — AMOXICILLIN 500 MG PO CAPS: 1000.0000 mg | ORAL_CAPSULE | Freq: Two times a day (BID) | ORAL | 0 refills | Status: AC

## 2016-12-13 NOTE — Progress Notes (Signed)
Reason for Consult: refractory H. pylori  Requesting Physician: Dr.Paul Armbruster  Subjective:    Patient ID: Emily Osborne, female    DOB: 11-01-1960, 56 y.o.   MRN: [id number]  HPI  56 year old lady with hx of recurrent diverticulitis and flare of severe clostridium difficile colitis in February of 2018 while on Augmentin to treat diverticulitis. She has had persistent H pylori antigen + in stool despite two course of antibiotic treatment. First she had PPI + clarithro + amox, then she had PPI + levaquin and amoxicillin. She has had rxn to metronidazole with her face turning red and swelling up after only one dose. She states she does not drink etoh and had not had any when this happened. She ha mx other allergies and intolerances to antibiotics.  She is scheduled for partial colectomy in early June.  I warned of risk of recurrent CDI with abx initiation but she would prefer to try now vs after her surgery.     Past Medical History:  Diagnosis Date  . Allergy   . Asthma    pt has inhaler  . C. difficile colitis   . Cataract    bil eyes  . Clostridium difficile colitis 12/13/2016  . Diverticulitis   . Edema   . Elevated blood pressure reading without diagnosis of hypertension    "just elevated when I'm in pain" (12/07/2015)  . Fatty liver   . Fibromyalgia   . Ganglion of joint    right wrist  . GERD (gastroesophageal reflux disease)   . H. pylori infection   . Helicobacter pylori (H. pylori) infection 12/13/2016  . Herpes zoster without mention of complication   . Hyperlipidemia   . IBS (irritable bowel syndrome)   . Nontraumatic rupture of Achilles tendon   . Other acute reactions to stress   . Pain in joint, shoulder region   . Pain in joint, upper arm   . Pain in limb   . Personal history of other diseases of digestive system   . Routine screening for STI (sexually transmitted infection) 12/13/2016  . Thyroid disease    hyperthyroidism    Past Surgical  History:  Procedure Laterality Date  . ABDOMINAL HYSTERECTOMY    . ACHILLES TENDON REPAIR Right 2005  . BUNIONECTOMY Bilateral    Bunionectomy 1983  . GANGLION CYST EXCISION Right    wrist  . LEFT HEART CATH AND CORONARY ANGIOGRAPHY N/A 10/03/2016   Procedure: Left Heart Cath and Coronary Angiography;  Surgeon: Burnell Blanks, MD;  Location: [facility];  Service: Cardiovascular;  Laterality: N/A;  . MENISCUS REPAIR Right 04/2014  . TUBAL LIGATION    . wisdomteeth extraction      Family History  Problem Relation Age of Onset  . Hypertension Father   . Diabetes Father   . Heart disease Father   . Kidney disease Father     Died, 108  . Leukemia Brother   . Cancer Brother     myleoblastic anemia  . Hyperlipidemia Mother   . Hypertension Mother     Died, 81  . Thyroid disease Mother     Thyroid surgery  . Pernicious anemia Sister   . Thyroid disease Sister     On thyroid Rx  . Lupus Daughter   . Coronary artery disease Brother   . Hypertension Brother   . Colon cancer Neg Hx   . Esophageal cancer Neg Hx   . Rectal cancer Neg Hx   .  Stomach cancer Neg Hx   . Pancreatic cancer Neg Hx       Social History   Social History  . Marital status: Divorced    Spouse name: N/A  . Number of children: 2  . Years of education: N/A   Occupational History  . Nurse/teacher    Social History Main Topics  . Smoking status: Never Smoker  . Smokeless tobacco: Never Used  . Alcohol use No  . Drug use: No  . Sexual activity: Not Currently    Partners: Male   Other Topics Concern  . None   Social History Narrative   Lives with alone.  She has two daughter.   She works as a Programme researcher, broadcasting/film/video at [employer]--- no    Allergies  Allergen Reactions  . Aspirin Other (See Comments)    REACTION: anaphylaxis  . Gentamicin Other (See Comments)    Eye drops turned the sclera bright red  . Ibuprofen Other (See Comments)    REACTION: anaphylaxsis  . Nsaids  Other (See Comments)    REACTION: anaphylaxis  . Doxycycline Other (See Comments)    REACTION: severe nausea/vomiting  . Influenza A (H1n1) [place] Other (See Comments)    REACTION: sick for 3 weeks  . Loratadine Other (See Comments)    Fatigue/weakness  . Metronidazole Other (See Comments)    REACTION: red face/swelling  . Ranitidine Hcl Other (See Comments)    REACTION: Lips turned red /peel  . Sulfamethoxazole-Trimethoprim Other (See Comments)    REACTION: face red/peel  . Tramadol Hcl Other (See Comments)    REACTION: paranoid     Current Outpatient Prescriptions:  .  acetaminophen (TYLENOL) 500 MG tablet, Take 1,000 mg by mouth every 6 (six) hours as needed for moderate pain. , Disp: , Rfl:  .  albuterol (PROAIR HFA) 108 (90 Base) MCG/ACT inhaler, USE 2 PUFFS EVERY 4 HOURS AS NEEDED FOR COUGH, WHEEZE OR SHORTNESS OF BREATH. (Patient taking differently: Inhale 2 puffs into the lungs every 4 (four) hours as needed for wheezing or shortness of breath. ), Disp: 8.5 each, Rfl: 2 .  albuterol (PROVENTIL) (2.5 MG/3ML) 0.083% nebulizer solution, Take 3 mLs (2.5 mg total) by nebulization every 6 (six) hours as needed for wheezing or shortness of breath., Disp: 75 mL, Rfl: 3 .  ALPRAZolam (XANAX) 0.25 MG tablet, Take 1 tablet (0.25 mg total) by mouth 3 (three) times daily as needed. (Patient taking differently: Take 0.125-0.25 mg by mouth 2 (two) times daily as needed for anxiety or sleep. ), Disp: 30 tablet, Rfl: 0 .  atenolol (TENORMIN) 25 MG tablet, Take 1 tablet (25 mg total) by mouth daily., Disp: 90 tablet, Rfl: 0 .  Blood Glucose Monitoring Suppl (FREESTYLE FREEDOM) KIT, 1 Device by Does not apply route daily., Disp: 1 each, Rfl: 0 .  Calcium Carb-Cholecalciferol (CALCIUM 1000 + D PO), Take 1 tablet by mouth daily., Disp: , Rfl:  .  cholecalciferol (VITAMIN D) 1000 units tablet, Take 1,000 Units by mouth daily. , Disp: , Rfl:  .  CLINPRO 5000 1.1 % PSTE, See admin instructions.  Brush teeth nightly, Disp: , Rfl: 4 .  DEXILANT 60 MG capsule, TAKE ONE CAPSULE BY MOUTH DAILY, Disp: 90 capsule, Rfl: 1 .  diclofenac sodium (VOLTAREN) 1 % GEL, Apply 2 g topically 2 (two) times daily as needed (pain). For pain, Disp: , Rfl:  .  fexofenadine (ALLEGRA) 180 MG tablet, Take 1 tablet (180 mg total) by mouth daily.,  Disp: 90 tablet, Rfl: 3 .  fluticasone (FLONASE) 50 MCG/ACT nasal spray, PLACE 2 SPRAYS INTO BOTH NOSTRILS DAILY., Disp: 16 g, Rfl: 4 .  Fluticasone-Salmeterol (ADVAIR DISKUS) 250-50 MCG/DOSE AEPB, Inhale 1 puff into the lungs 2 (two) times daily. (Patient taking differently: Inhale 1 puff into the lungs daily. ), Disp: 60 each, Rfl: 2 .  FREESTYLE LITE test strip, USE TO CHECK BLOOD SUGAR ONCE A DAY, Disp: 100 each, Rfl: 6 .  Iron Combinations (I.L.X. B-12) ELIX, Take 1 each by mouth daily as needed (energy). 1 droplet full, Disp: , Rfl:  .  lidocaine (LIDODERM) 5 %, APPLY 1 PATCH ON THE SKIN EVERY 12 HRS REMOVE, DISCARD PATCH WITHIN 12 HRS OR AS DIRECTED (Patient taking differently: APPLY 1 PATCH ON THE SKIN EVERY 12 HRS REMOVE, DISCARD PATCH WITHIN 12 HRS AS NEEDED FOR PAIN), Disp: 30 patch, Rfl: 0 .  meclizine (ANTIVERT) 32 MG tablet, Take 1 tablet (32 mg total) by mouth 3 (three) times daily as needed for dizziness., Disp: 30 tablet, Rfl: 0 .  methimazole (TAPAZOLE) 5 MG tablet, Take 0.5 tablets (2.5 mg total) by mouth daily., Disp: 90 tablet, Rfl: 0 .  promethazine (PHENERGAN) 25 MG tablet, Take 1 tablet (25 mg total) by mouth 4 (four) times daily as needed for nausea or vomiting., Disp: 30 tablet, Rfl: 0 .  triamcinolone cream (KENALOG) 0.1 %, APPLY TO AFFECTED AREA TWICE A DAY (Patient taking differently: APPLY TO AFFECTED AREA TWICE A DAY AS NEEDED FOR RASH), Disp: 30 g, Rfl: 0 .  valACYclovir (VALTREX) 1000 MG tablet, Take 1 tablet (1,000 mg total) by mouth 3 (three) times daily. (Patient taking differently: Take 1,000 mg by mouth. Take 2g twice a day at first sign of  fever blister, then take 1g twice daily until symptoms are gone), Disp: 30 tablet, Rfl: 5 .  amoxicillin (AMOXIL) 500 MG capsule, Take 2 capsules (1,000 mg total) by mouth 2 (two) times daily., Disp: 56 capsule, Rfl: 0 .  rifabutin (MYCOBUTIN) 150 MG capsule, Take 2 capsules (300 mg total) by mouth daily., Disp: 28 capsule, Rfl: 0   Review of Systems  Constitutional: Negative for chills, diaphoresis and fever.  HENT: Negative for congestion, hearing loss, sore throat and tinnitus.   Respiratory: Negative for cough, shortness of breath and wheezing.   Cardiovascular: Negative for chest pain, palpitations and leg swelling.  Gastrointestinal: Positive for abdominal pain and nausea. Negative for blood in stool, constipation, diarrhea and vomiting.  Genitourinary: Negative for dysuria, flank pain and hematuria.  Musculoskeletal: Negative for back pain and myalgias.  Skin: Negative for rash.  Neurological: Negative for dizziness, weakness and headaches.  Hematological: Does not bruise/bleed easily.  Psychiatric/Behavioral: Negative for suicidal ideas. The patient is not nervous/anxious.        Objective:   Physical Exam  Constitutional: She is oriented to person, place, and time. She appears well-developed and well-nourished. No distress.  HENT:  Head: Normocephalic and atraumatic.  Mouth/Throat: No oropharyngeal exudate.  Eyes: Conjunctivae and EOM are normal. No scleral icterus.  Neck: Normal range of motion. Neck supple.  Cardiovascular: Normal rate and regular rhythm.   Pulmonary/Chest: Effort normal. No respiratory distress. She has no wheezes. She has no rales. She exhibits no tenderness.  Abdominal: Soft. Bowel sounds are normal. She exhibits no distension. There is tenderness in the left lower quadrant.  Musculoskeletal: She exhibits no edema or tenderness.  Neurological: She is alert and oriented to person, place, and time. She exhibits normal  muscle tone. Coordination normal.    Skin: Skin is warm and dry. No rash noted. She is not diaphoretic. No erythema. No pallor.  Psychiatric: She has a normal mood and affect. Her behavior is normal. Judgment and thought content normal.          Assessment & Plan:   Refractory H pylori: she does have one daughter with sig GI problems though she lives in [place]. Other daughter lives with the patient but is not ill and no obvious carrier of H pylori  We will try with course of PPI + Rifabutin and Amoxicillin x 14 days  Agree with repeat stool testing in future  If does not work this time would consider EGD to obtain sample for culture, resistance testing (and will have to investigate how to accomplish this) and would check daughter who lives with her and other intimate contacts to see if they might be carriers and passing back to her.   STI screening: will check for HIV, she is HCV negative.  CDI: vigilance for recurrence with initiation of abx. Avoid clinda and FQ if possible. Would be great to get her off of the PPI as well  Recurrent diverticulitis: she has some LLQ pain interminttently but no obvious active disease now. Agree with surgery    I spent greater than 40 minutes with the patient including greater than 50% of time in face to face counsel of the patient re her H pylori, her CDI and her recurrent diverticultiis and in coordination of their care.  Lavell Islam. Tommy Medal. MD

## 2016-12-13 NOTE — Progress Notes (Signed)
   Subjective:    Patient ID: Emily Osborne, female    DOB: [birth year], 56 y.o.   MRN: [id number]  HPI    Review of Systems     Objective:   Physical Exam        Assessment & Plan:

## 2016-12-14 LAB — HIV ANTIBODY (ROUTINE TESTING W REFLEX): HIV 1&2 Ab, 4th Generation: NONREACTIVE

## 2017-01-10 ENCOUNTER — Encounter (HOSPITAL_COMMUNITY): Payer: Self-pay

## 2017-01-10 NOTE — Patient Instructions (Addendum)
ZANIYA MCAULAY  01/10/2017   Your procedure is scheduled on: 01/23/17  Report to [facility] Main  Entrance Take [place]  elevators to 3rd floor to  [place] at     [place] AM.     Call this number if you have problems the morning of surgery [phone]    Remember: ONLY 1 PERSON MAY GO WITH YOU TO SHORT STAY TO GET  READY MORNING OF YOUR SURGERY.  Do not eat food or drink liquids :After Midnight.     Take these medicines the morning of surgery with A SIP OF WATER: atenelol, inhalers and bring, allegra, dexilant methimazole                                You may not have any metal on your body including hair pins and              piercings  Do not wear jewelry, make-up, lotions, powders or perfumes, deodorant             Do not wear nail polish.  Do not shave  48 hours prior to surgery.        .   Do not bring valuables to the hospital. [place].  Contacts, dentures or bridgework may not be worn into surgery.  Leave suitcase in the car. After surgery it may be brought to your room.                 Please read over the following fact sheets you were given: _____________________________________________________________________            [facility] - Preparing for Surgery Before surgery, you can play an important role.  Because skin is not sterile, your skin needs to be as free of germs as possible.  You can reduce the number of germs on your skin by washing with CHG (chlorahexidine gluconate) soap before surgery.  CHG is an antiseptic cleaner which kills germs and bonds with the skin to continue killing germs even after washing. Please DO NOT use if you have an allergy to CHG or antibacterial soaps.  If your skin becomes reddened/irritated stop using the CHG and inform your nurse when you arrive at Short Stay. Do not shave (including legs and underarms) for at least 48 hours prior to the first CHG  shower.  You may shave your face/neck. Please follow these instructions carefully:  1.  Shower with CHG Soap the night before surgery and the  morning of Surgery.  2.  If you choose to wash your hair, wash your hair first as usual with your  normal  shampoo.  3.  After you shampoo, rinse your hair and body thoroughly to remove the  shampoo.                           4.  Use CHG as you would any other liquid soap.  You can apply chg directly  to the skin and wash                       Gently with a scrungie or clean washcloth.  5.  Apply the CHG  Soap to your body ONLY FROM THE NECK DOWN.   Do not use on face/ open                           Wound or open sores. Avoid contact with eyes, ears mouth and genitals (private parts).                       Wash face,  Genitals (private parts) with your normal soap.             6.  Wash thoroughly, paying special attention to the area where your surgery  will be performed.  7.  Thoroughly rinse your body with warm water from the neck down.  8.  DO NOT shower/wash with your normal soap after using and rinsing off  the CHG Soap.                9.  Pat yourself dry with a clean towel.            10.  Wear clean pajamas.            11.  Place clean sheets on your bed the night of your first shower and do not  sleep with pets. Day of Surgery : Do not apply any lotions/deodorants the morning of surgery.  Please wear clean clothes to the hospital/surgery center.  FAILURE TO FOLLOW THESE INSTRUCTIONS MAY RESULT IN THE CANCELLATION OF YOUR SURGERY PATIENT SIGNATURE_________________________________  NURSE SIGNATURE__________________________________  ________________________________________________________________________    CLEAR LIQUID DIET   Day of bowel prep   Foods Allowed                                                                     Foods Excluded  Coffee and tea, regular and decaf                             liquids that you cannot  Plain  Jell-O in any flavor                                             see through such as: Fruit ices (not with fruit pulp)                                     milk, soups, orange juice  Iced Popsicles                                    All solid food Carbonated beverages, regular and diet                                    Cranberry, grape and apple juices Sports drinks like Gatorade Lightly seasoned clear broth or consume(fat free) Sugar, honey syrup  Sample Menu Breakfast  Lunch                                     Supper Cranberry juice                    Beef broth                            Chicken broth Jell-O                                     Grape juice                           Apple juice Coffee or tea                        Jell-O                                      Popsicle                                                Coffee or tea                        Coffee or tea  _____________________________________________________________________

## 2017-01-10 NOTE — Progress Notes (Signed)
Office visit ID 12/13/16 epic lov 10/24/16 epic ekg 10/13/16 epic Echo 12/08/15 epic Cath 2018 epic

## 2017-01-11 ENCOUNTER — Other Ambulatory Visit: Payer: Self-pay | Admitting: Internal Medicine

## 2017-01-15 ENCOUNTER — Encounter (HOSPITAL_COMMUNITY): Payer: Self-pay

## 2017-01-15 ENCOUNTER — Encounter (HOSPITAL_COMMUNITY)
Admission: RE | Admit: 2017-01-15 | Discharge: 2017-01-15 | Disposition: A | Discharge status: Home / Self Care | Payer: BC Managed Care – PPO | Source: Ambulatory Visit | Attending: General Surgery | Admitting: General Surgery

## 2017-01-15 DIAGNOSIS — K5790 Diverticulosis of intestine, part unspecified, without perforation or abscess without bleeding: Secondary | ICD-10-CM | POA: Insufficient documentation

## 2017-01-15 DIAGNOSIS — Z01818 Encounter for other preprocedural examination: Secondary | ICD-10-CM | POA: Insufficient documentation

## 2017-01-15 HISTORY — DX: Other specified postprocedural states: R11.2

## 2017-01-15 HISTORY — DX: Anxiety disorder, unspecified: F41.9

## 2017-01-15 HISTORY — DX: Cardiac arrhythmia, unspecified: I49.9

## 2017-01-15 HISTORY — DX: Headache, unspecified: R51.9

## 2017-01-15 HISTORY — DX: Thyrotoxicosis, unspecified without thyrotoxic crisis or storm: E05.90

## 2017-01-15 HISTORY — DX: Pneumonia, unspecified organism: J18.9

## 2017-01-15 HISTORY — DX: Other specified postprocedural states: Z98.890

## 2017-01-15 HISTORY — DX: Headache: R51

## 2017-01-15 LAB — BASIC METABOLIC PANEL
ANION GAP: 6 (ref 5–15)
BUN: 11 mg/dL (ref 6–20)
CALCIUM: 9.5 mg/dL (ref 8.9–10.3)
CO2: 30 mmol/L (ref 22–32)
Chloride: 108 mmol/L (ref 101–111)
Creatinine, Ser: 1.04 mg/dL — ABNORMAL HIGH (ref 0.44–1.00)
GFR, EST NON AFRICAN AMERICAN: 59 mL/min — AB (ref >60)
Glucose, Bld: 95 mg/dL (ref 65–99)
Potassium: 4.4 mmol/L (ref 3.5–5.1)
Sodium: 144 mmol/L (ref 135–145)

## 2017-01-15 LAB — CBC
HCT: 38 % (ref 36.0–46.0)
HEMOGLOBIN: 12.8 g/dL (ref 12.0–15.0)
MCH: 29.2 pg (ref 26.0–34.0)
MCHC: 33.7 g/dL (ref 30.0–36.0)
MCV: 86.8 fL (ref 78.0–100.0)
Platelets: 233 10*3/uL (ref 150–400)
RBC: 4.38 MIL/uL (ref 3.87–5.11)
RDW: 14.4 % (ref 11.5–15.5)
WBC: 6.1 10*3/uL (ref 4.0–10.5)

## 2017-01-15 LAB — ABO/RH: ABO/RH(D): A POS

## 2017-01-16 LAB — HEMOGLOBIN A1C
HEMOGLOBIN A1C: 5.5 % (ref 4.8–5.6)
MEAN PLASMA GLUCOSE: 111 mg/dL

## 2017-01-23 ENCOUNTER — Inpatient Hospital Stay (HOSPITAL_COMMUNITY): Payer: BC Managed Care – PPO

## 2017-01-23 ENCOUNTER — Encounter (HOSPITAL_COMMUNITY): Payer: Self-pay | Admitting: Certified Registered"

## 2017-01-23 ENCOUNTER — Encounter (HOSPITAL_COMMUNITY)
Admission: RE | Disposition: A | Discharge status: Home / Self Care | Payer: Self-pay | Source: Ambulatory Visit | Attending: General Surgery

## 2017-01-23 ENCOUNTER — Inpatient Hospital Stay (HOSPITAL_COMMUNITY)
Admission: RE | Admit: 2017-01-23 | Discharge: 2017-01-27 | DRG: 331 | Disposition: A | Discharge status: Home / Self Care | Payer: BC Managed Care – PPO | Source: Ambulatory Visit | Attending: General Surgery | Admitting: General Surgery

## 2017-01-23 DIAGNOSIS — Z8371 Family history of colonic polyps: Secondary | ICD-10-CM

## 2017-01-23 DIAGNOSIS — Z887 Allergy status to serum and vaccine status: Secondary | ICD-10-CM | POA: Diagnosis not present

## 2017-01-23 DIAGNOSIS — Z886 Allergy status to analgesic agent status: Secondary | ICD-10-CM | POA: Diagnosis not present

## 2017-01-23 DIAGNOSIS — J45909 Unspecified asthma, uncomplicated: Secondary | ICD-10-CM | POA: Diagnosis present

## 2017-01-23 DIAGNOSIS — M797 Fibromyalgia: Secondary | ICD-10-CM | POA: Diagnosis present

## 2017-01-23 DIAGNOSIS — Z825 Family history of asthma and other chronic lower respiratory diseases: Secondary | ICD-10-CM | POA: Diagnosis not present

## 2017-01-23 DIAGNOSIS — E0590 Thyrotoxicosis, unspecified without thyrotoxic crisis or storm: Secondary | ICD-10-CM | POA: Diagnosis present

## 2017-01-23 DIAGNOSIS — Z87892 Personal history of anaphylaxis: Secondary | ICD-10-CM | POA: Diagnosis not present

## 2017-01-23 DIAGNOSIS — Z7951 Long term (current) use of inhaled steroids: Secondary | ICD-10-CM | POA: Diagnosis not present

## 2017-01-23 DIAGNOSIS — Z883 Allergy status to other anti-infective agents status: Secondary | ICD-10-CM | POA: Diagnosis not present

## 2017-01-23 DIAGNOSIS — K5790 Diverticulosis of intestine, part unspecified, without perforation or abscess without bleeding: Secondary | ICD-10-CM | POA: Diagnosis present

## 2017-01-23 DIAGNOSIS — Z888 Allergy status to other drugs, medicaments and biological substances status: Secondary | ICD-10-CM

## 2017-01-23 DIAGNOSIS — Z90711 Acquired absence of uterus with remaining cervical stump: Secondary | ICD-10-CM

## 2017-01-23 DIAGNOSIS — Z885 Allergy status to narcotic agent status: Secondary | ICD-10-CM

## 2017-01-23 DIAGNOSIS — K5732 Diverticulitis of large intestine without perforation or abscess without bleeding: Principal | ICD-10-CM | POA: Diagnosis present

## 2017-01-23 HISTORY — PX: COLON SURGERY: SHX602

## 2017-01-23 LAB — TYPE AND SCREEN
ABO/RH(D): A POS
ANTIBODY SCREEN: NEGATIVE

## 2017-01-23 SURGERY — COLECTOMY, PARTIAL, ROBOT-ASSISTED, LAPAROSCOPIC
Anesthesia: General

## 2017-01-23 MED ORDER — BUPIVACAINE HCL (PF) 0.25 % IJ SOLN
INTRAMUSCULAR | Status: DC | PRN
  Administered 2017-01-23: 30 mL

## 2017-01-23 MED ORDER — METHIMAZOLE 5 MG PO TABS
2.5000 mg | ORAL_TABLET | Freq: Every day | ORAL | Status: DC
  Administered 2017-01-24 – 2017-01-27 (×4): 2.5 mg via ORAL
  Filled 2017-01-23 (×4): qty 1

## 2017-01-23 MED ORDER — FENTANYL CITRATE (PF) 250 MCG/5ML IJ SOLN
INTRAMUSCULAR | Status: AC
  Filled 2017-01-23: qty 5

## 2017-01-23 MED ORDER — PROPOFOL 10 MG/ML IV BOLUS
INTRAVENOUS | Status: AC
  Filled 2017-01-23: qty 20

## 2017-01-23 MED ORDER — ENOXAPARIN SODIUM 40 MG/0.4ML ~~LOC~~ SOLN
40.0000 mg | SUBCUTANEOUS | Status: DC
  Filled 2017-01-23 (×4): qty 0.4

## 2017-01-23 MED ORDER — LIDOCAINE 2% (20 MG/ML) 5 ML SYRINGE
INTRAMUSCULAR | Status: DC | PRN
  Administered 2017-01-23: 80 mg via INTRAVENOUS

## 2017-01-23 MED ORDER — ALVIMOPAN 12 MG PO CAPS
12.0000 mg | ORAL_CAPSULE | Freq: Two times a day (BID) | ORAL | Status: DC
  Filled 2017-01-23: qty 1

## 2017-01-23 MED ORDER — LIDOCAINE 2% (20 MG/ML) 5 ML SYRINGE
INTRAMUSCULAR | Status: AC
  Filled 2017-01-23: qty 5

## 2017-01-23 MED ORDER — PROPOFOL 10 MG/ML IV BOLUS
INTRAVENOUS | Status: DC | PRN
  Administered 2017-01-23: 150 mg via INTRAVENOUS

## 2017-01-23 MED ORDER — METOPROLOL TARTRATE 5 MG/5ML IV SOLN: 5.0000 mg | Freq: Four times a day (QID) | INTRAVENOUS | Status: DC | PRN

## 2017-01-23 MED ORDER — CEFOTETAN DISODIUM 2 G IJ SOLR
2.0000 g | Freq: Two times a day (BID) | INTRAMUSCULAR | Status: AC
  Administered 2017-01-23: 2 g via INTRAVENOUS
  Filled 2017-01-23: qty 2

## 2017-01-23 MED ORDER — ONDANSETRON HCL 4 MG/2ML IJ SOLN: 4.0000 mg | Freq: Four times a day (QID) | INTRAMUSCULAR | Status: DC | PRN

## 2017-01-23 MED ORDER — LACTATED RINGERS IR SOLN
Status: DC | PRN
  Administered 2017-01-23: 1000 mL

## 2017-01-23 MED ORDER — ALUM & MAG HYDROXIDE-SIMETH 200-200-20 MG/5ML PO SUSP: 30.0000 mL | Freq: Four times a day (QID) | ORAL | Status: DC | PRN

## 2017-01-23 MED ORDER — MORPHINE SULFATE (PF) 2 MG/ML IV SOLN
2.0000 mg | INTRAVENOUS | Status: DC | PRN
  Administered 2017-01-23: 2 mg via INTRAVENOUS
  Administered 2017-01-23: 4 mg via INTRAVENOUS
  Filled 2017-01-23: qty 1
  Filled 2017-01-23: qty 2

## 2017-01-23 MED ORDER — ACETAMINOPHEN 500 MG PO TABS
1000.0000 mg | ORAL_TABLET | Freq: Four times a day (QID) | ORAL | Status: DC
  Administered 2017-01-23 – 2017-01-27 (×16): 1000 mg via ORAL
  Filled 2017-01-23 (×18): qty 2

## 2017-01-23 MED ORDER — 0.9 % SODIUM CHLORIDE (POUR BTL) OPTIME
TOPICAL | Status: DC | PRN
  Administered 2017-01-23: 2000 mL

## 2017-01-23 MED ORDER — LACTATED RINGERS IV SOLN
INTRAVENOUS | Status: DC | PRN
  Administered 2017-01-23: 08:00:00 via INTRAVENOUS

## 2017-01-23 MED ORDER — MEPERIDINE HCL 50 MG/ML IJ SOLN: 6.2500 mg | INTRAMUSCULAR | Status: DC | PRN

## 2017-01-23 MED ORDER — DEXAMETHASONE SODIUM PHOSPHATE 10 MG/ML IJ SOLN
INTRAMUSCULAR | Status: AC
  Filled 2017-01-23: qty 1

## 2017-01-23 MED ORDER — HYDROMORPHONE HCL 1 MG/ML IJ SOLN
1.0000 mg | INTRAMUSCULAR | Status: DC | PRN
  Administered 2017-01-23 – 2017-01-24 (×3): 1 mg via INTRAVENOUS
  Filled 2017-01-23 (×3): qty 1

## 2017-01-23 MED ORDER — MIDAZOLAM HCL 2 MG/2ML IJ SOLN
INTRAMUSCULAR | Status: AC
  Filled 2017-01-23: qty 2

## 2017-01-23 MED ORDER — ONDANSETRON HCL 4 MG/2ML IJ SOLN
INTRAMUSCULAR | Status: AC
  Filled 2017-01-23: qty 2

## 2017-01-23 MED ORDER — FENTANYL CITRATE (PF) 100 MCG/2ML IJ SOLN
INTRAMUSCULAR | Status: AC
  Administered 2017-01-23: 50 ug via INTRAVENOUS
  Filled 2017-01-23: qty 2

## 2017-01-23 MED ORDER — ROCURONIUM BROMIDE 10 MG/ML (PF) SYRINGE
PREFILLED_SYRINGE | INTRAVENOUS | Status: DC | PRN
  Administered 2017-01-23: 10 mg via INTRAVENOUS
  Administered 2017-01-23: 50 mg via INTRAVENOUS
  Administered 2017-01-23: 20 mg via INTRAVENOUS
  Administered 2017-01-23: 10 mg via INTRAVENOUS

## 2017-01-23 MED ORDER — DIPHENHYDRAMINE HCL 25 MG PO CAPS: 25.0000 mg | ORAL_CAPSULE | Freq: Four times a day (QID) | ORAL | Status: DC | PRN

## 2017-01-23 MED ORDER — ATENOLOL 25 MG PO TABS
12.5000 mg | ORAL_TABLET | Freq: Every day | ORAL | Status: DC
  Administered 2017-01-24 – 2017-01-27 (×4): 12.5 mg via ORAL
  Filled 2017-01-23 (×4): qty 1

## 2017-01-23 MED ORDER — PANTOPRAZOLE SODIUM 40 MG PO TBEC
40.0000 mg | DELAYED_RELEASE_TABLET | Freq: Every day | ORAL | Status: DC
  Administered 2017-01-24 – 2017-01-27 (×4): 40 mg via ORAL
  Filled 2017-01-23 (×4): qty 1

## 2017-01-23 MED ORDER — SUGAMMADEX SODIUM 200 MG/2ML IV SOLN
INTRAVENOUS | Status: AC
  Filled 2017-01-23: qty 2

## 2017-01-23 MED ORDER — ALVIMOPAN 12 MG PO CAPS
12.0000 mg | ORAL_CAPSULE | Freq: Once | ORAL | Status: AC
  Administered 2017-01-23: 12 mg via ORAL
  Filled 2017-01-23: qty 1

## 2017-01-23 MED ORDER — ONDANSETRON HCL 4 MG/2ML IJ SOLN
INTRAMUSCULAR | Status: DC | PRN
  Administered 2017-01-23: 4 mg via INTRAVENOUS

## 2017-01-23 MED ORDER — INSULIN ASPART 100 UNIT/ML ~~LOC~~ SOLN: 0.0000 [IU] | Freq: Three times a day (TID) | SUBCUTANEOUS | Status: DC

## 2017-01-23 MED ORDER — ROCURONIUM BROMIDE 50 MG/5ML IV SOSY
PREFILLED_SYRINGE | INTRAVENOUS | Status: AC
  Filled 2017-01-23: qty 5

## 2017-01-23 MED ORDER — MIDAZOLAM HCL 5 MG/5ML IJ SOLN
INTRAMUSCULAR | Status: DC | PRN
  Administered 2017-01-23: 2 mg via INTRAVENOUS

## 2017-01-23 MED ORDER — DIPHENHYDRAMINE HCL 50 MG/ML IJ SOLN: 25.0000 mg | Freq: Four times a day (QID) | INTRAMUSCULAR | Status: DC | PRN

## 2017-01-23 MED ORDER — SACCHAROMYCES BOULARDII 250 MG PO CAPS
250.0000 mg | ORAL_CAPSULE | Freq: Two times a day (BID) | ORAL | Status: DC
  Administered 2017-01-23 – 2017-01-27 (×9): 250 mg via ORAL
  Filled 2017-01-23 (×9): qty 1

## 2017-01-23 MED ORDER — SUGAMMADEX SODIUM 200 MG/2ML IV SOLN
INTRAVENOUS | Status: DC | PRN
  Administered 2017-01-23: 200 mg via INTRAVENOUS

## 2017-01-23 MED ORDER — ALBUTEROL SULFATE (2.5 MG/3ML) 0.083% IN NEBU: 2.5000 mg | INHALATION_SOLUTION | RESPIRATORY_TRACT | Status: DC | PRN

## 2017-01-23 MED ORDER — ROCURONIUM BROMIDE 50 MG/5ML IV SOSY
PREFILLED_SYRINGE | INTRAVENOUS | Status: AC
Start: 2017-01-23 — End: 2017-01-23
  Filled 2017-01-23: qty 5

## 2017-01-23 MED ORDER — DEXAMETHASONE SODIUM PHOSPHATE 10 MG/ML IJ SOLN
INTRAMUSCULAR | Status: DC | PRN
  Administered 2017-01-23: 10 mg via INTRAVENOUS

## 2017-01-23 MED ORDER — KCL IN DEXTROSE-NACL 20-5-0.45 MEQ/L-%-% IV SOLN
INTRAVENOUS | Status: DC
  Administered 2017-01-23 – 2017-01-25 (×3): via INTRAVENOUS
  Filled 2017-01-23 (×5): qty 1000

## 2017-01-23 MED ORDER — BUPIVACAINE HCL (PF) 0.25 % IJ SOLN
INTRAMUSCULAR | Status: AC
  Filled 2017-01-23: qty 30

## 2017-01-23 MED ORDER — BUPIVACAINE LIPOSOME 1.3 % IJ SUSP
20.0000 mL | Freq: Once | INTRAMUSCULAR | Status: AC
  Administered 2017-01-23: 20 mL
  Filled 2017-01-23: qty 20

## 2017-01-23 MED ORDER — MOMETASONE FURO-FORMOTEROL FUM 200-5 MCG/ACT IN AERO
2.0000 | INHALATION_SPRAY | Freq: Two times a day (BID) | RESPIRATORY_TRACT | Status: DC
  Administered 2017-01-23 – 2017-01-27 (×8): 2 via RESPIRATORY_TRACT
  Filled 2017-01-23: qty 8.8

## 2017-01-23 MED ORDER — LACTATED RINGERS IV SOLN: INTRAVENOUS | Status: DC

## 2017-01-23 MED ORDER — ONDANSETRON HCL 4 MG PO TABS
4.0000 mg | ORAL_TABLET | Freq: Four times a day (QID) | ORAL | Status: DC | PRN
  Administered 2017-01-27: 4 mg via ORAL
  Filled 2017-01-23: qty 1

## 2017-01-23 MED ORDER — FENTANYL CITRATE (PF) 100 MCG/2ML IJ SOLN
25.0000 ug | INTRAMUSCULAR | Status: DC | PRN
  Administered 2017-01-23 (×3): 50 ug via INTRAVENOUS

## 2017-01-23 MED ORDER — ALBUTEROL SULFATE HFA 108 (90 BASE) MCG/ACT IN AERS: 2.0000 | INHALATION_SPRAY | RESPIRATORY_TRACT | Status: DC | PRN

## 2017-01-23 MED ORDER — ALPRAZOLAM 0.25 MG PO TABS
0.1250 mg | ORAL_TABLET | Freq: Two times a day (BID) | ORAL | Status: DC | PRN
  Administered 2017-01-24 – 2017-01-27 (×4): 0.25 mg via ORAL
  Filled 2017-01-23 (×4): qty 1

## 2017-01-23 MED ORDER — METOCLOPRAMIDE HCL 5 MG/ML IJ SOLN: 10.0000 mg | Freq: Once | INTRAMUSCULAR | Status: DC | PRN

## 2017-01-23 MED ORDER — INSULIN ASPART 100 UNIT/ML ~~LOC~~ SOLN: 0.0000 [IU] | Freq: Every day | SUBCUTANEOUS | Status: DC

## 2017-01-23 MED ORDER — DEXTROSE 5 % IV SOLN
2.0000 g | INTRAVENOUS | Status: AC
  Administered 2017-01-23: 2 g via INTRAVENOUS
  Filled 2017-01-23: qty 2

## 2017-01-23 MED ORDER — FENTANYL CITRATE (PF) 100 MCG/2ML IJ SOLN
INTRAMUSCULAR | Status: DC | PRN
  Administered 2017-01-23: 50 ug via INTRAVENOUS
  Administered 2017-01-23: 100 ug via INTRAVENOUS
  Administered 2017-01-23: 50 ug via INTRAVENOUS

## 2017-01-23 SURGICAL SUPPLY — 95 items
BLADE EXTENDED COATED 6.5IN (ELECTRODE) IMPLANT
CANNULA REDUC XI 12-8 STAPL (CANNULA) ×1
CANNULA REDUC XI 12-8MM STAPL (CANNULA) ×1
CANNULA REDUCER 12-8 DVNC XI (CANNULA) ×1 IMPLANT
CELLS DAT CNTRL 66122 CELL SVR (MISCELLANEOUS) IMPLANT
CLIP LIGATING HEM O LOK PURPLE (MISCELLANEOUS) IMPLANT
CLIP LIGATING HEMOLOK MED (MISCELLANEOUS) IMPLANT
COUNTER NEEDLE 20 DBL MAG RED (NEEDLE) ×3 IMPLANT
COVER MAYO STAND STRL (DRAPES) ×6 IMPLANT
COVER SURGICAL LIGHT HANDLE (MISCELLANEOUS) ×3 IMPLANT
COVER TIP SHEARS 8 DVNC (MISCELLANEOUS) ×1 IMPLANT
COVER TIP SHEARS 8MM DA VINCI (MISCELLANEOUS) ×2
DECANTER SPIKE VIAL GLASS SM (MISCELLANEOUS) ×3 IMPLANT
DERMABOND ADVANCED (GAUZE/BANDAGES/DRESSINGS) ×2
DERMABOND ADVANCED .7 DNX12 (GAUZE/BANDAGES/DRESSINGS) ×1 IMPLANT
DEVICE TROCAR PUNCTURE CLOSURE (ENDOMECHANICALS) IMPLANT
DRAIN CHANNEL 19F RND (DRAIN) IMPLANT
DRAPE ARM DVNC X/XI (DISPOSABLE) ×4 IMPLANT
DRAPE COLUMN DVNC XI (DISPOSABLE) ×1 IMPLANT
DRAPE DA VINCI XI ARM (DISPOSABLE) ×8
DRAPE DA VINCI XI COLUMN (DISPOSABLE) ×2
DRAPE SURG IRRIG POUCH 19X23 (DRAPES) ×3 IMPLANT
DRSG OPSITE POSTOP 4X10 (GAUZE/BANDAGES/DRESSINGS) IMPLANT
DRSG OPSITE POSTOP 4X6 (GAUZE/BANDAGES/DRESSINGS) IMPLANT
DRSG OPSITE POSTOP 4X8 (GAUZE/BANDAGES/DRESSINGS) IMPLANT
ELECT PENCIL ROCKER SW 15FT (MISCELLANEOUS) ×6 IMPLANT
ELECT REM PT RETURN 15FT ADLT (MISCELLANEOUS) ×3 IMPLANT
ENDOLOOP SUT PDS II  0 18 (SUTURE)
ENDOLOOP SUT PDS II 0 18 (SUTURE) IMPLANT
EVACUATOR SILICONE 100CC (DRAIN) IMPLANT
GAUZE SPONGE 4X4 12PLY STRL (GAUZE/BANDAGES/DRESSINGS) IMPLANT
GLOVE BIO SURGEON STRL SZ 6.5 (GLOVE) ×6 IMPLANT
GLOVE BIO SURGEONS STRL SZ 6.5 (GLOVE) ×3
GLOVE BIOGEL PI IND STRL 7.0 (GLOVE) ×3 IMPLANT
GLOVE BIOGEL PI INDICATOR 7.0 (GLOVE) ×6
GOWN STRL REUS W/TWL 2XL LVL3 (GOWN DISPOSABLE) ×9 IMPLANT
GOWN STRL REUS W/TWL XL LVL3 (GOWN DISPOSABLE) ×12 IMPLANT
GRASPER ENDOPATH ANVIL 10MM (MISCELLANEOUS) IMPLANT
HOLDER FOLEY CATH W/STRAP (MISCELLANEOUS) ×3 IMPLANT
IRRIG SUCT STRYKERFLOW 2 WTIP (MISCELLANEOUS) ×3
IRRIGATION SUCT STRKRFLW 2 WTP (MISCELLANEOUS) ×1 IMPLANT
KIT PROCEDURE DA VINCI SI (MISCELLANEOUS) ×2
KIT PROCEDURE DVNC SI (MISCELLANEOUS) ×1 IMPLANT
LEGGING LITHOTOMY PAIR STRL (DRAPES) ×3 IMPLANT
NEEDLE INSUFFLATION 14GA 120MM (NEEDLE) ×3 IMPLANT
PACK CARDIOVASCULAR III (CUSTOM PROCEDURE TRAY) ×3 IMPLANT
PACK COLON (CUSTOM PROCEDURE TRAY) ×3 IMPLANT
PORT LAP GEL ALEXIS MED 5-9CM (MISCELLANEOUS) IMPLANT
RTRCTR WOUND ALEXIS 18CM MED (MISCELLANEOUS)
SCISSORS LAP 5X35 DISP (ENDOMECHANICALS) ×3 IMPLANT
SEAL CANN UNIV 5-8 DVNC XI (MISCELLANEOUS) ×3 IMPLANT
SEAL XI 5MM-8MM UNIVERSAL (MISCELLANEOUS) ×6
SEALER VESSEL DA VINCI XI (MISCELLANEOUS) ×2
SEALER VESSEL EXT DVNC XI (MISCELLANEOUS) ×1 IMPLANT
SLEEVE ADV FIXATION 5X100MM (TROCAR) IMPLANT
SOLUTION ELECTROLUBE (MISCELLANEOUS) ×3 IMPLANT
STAPLER 45 BLU RELOAD XI (STAPLE) ×1 IMPLANT
STAPLER 45 BLUE RELOAD XI (STAPLE) ×2
STAPLER 45 GREEN RELOAD XI (STAPLE)
STAPLER 45 GRN RELOAD XI (STAPLE) IMPLANT
STAPLER CANNULA SEAL DVNC XI (STAPLE) ×1 IMPLANT
STAPLER CANNULA SEAL XI (STAPLE) ×2
STAPLER CIRC ILS CVD 33MM 37CM (STAPLE) ×3 IMPLANT
STAPLER SHEATH (SHEATH) ×2
STAPLER SHEATH ENDOWRIST DVNC (SHEATH) ×1 IMPLANT
STAPLER VISISTAT 35W (STAPLE) ×3 IMPLANT
SUT ETHILON 2 0 PS N (SUTURE) IMPLANT
SUT NOVA NAB GS-21 0 18 T12 DT (SUTURE) ×6 IMPLANT
SUT PDS AB 1 CTX 36 (SUTURE) ×6 IMPLANT
SUT PDS AB 1 TP1 96 (SUTURE) IMPLANT
SUT PROLENE 2 0 KS (SUTURE) ×3 IMPLANT
SUT SILK 2 0 (SUTURE) ×2
SUT SILK 2 0 SH CR/8 (SUTURE) ×3 IMPLANT
SUT SILK 2-0 18XBRD TIE 12 (SUTURE) ×1 IMPLANT
SUT SILK 3 0 (SUTURE) ×2
SUT SILK 3 0 SH CR/8 (SUTURE) ×3 IMPLANT
SUT SILK 3-0 18XBRD TIE 12 (SUTURE) ×1 IMPLANT
SUT V-LOC BARB 180 2/0GR6 GS22 (SUTURE)
SUT VIC AB 2-0 SH 18 (SUTURE) ×3 IMPLANT
SUT VIC AB 2-0 SH 27 (SUTURE) ×2
SUT VIC AB 2-0 SH 27X BRD (SUTURE) ×1 IMPLANT
SUT VIC AB 3-0 SH 18 (SUTURE) IMPLANT
SUT VIC AB 4-0 PS2 27 (SUTURE) ×6 IMPLANT
SUT VICRYL 0 UR6 27IN ABS (SUTURE) ×6 IMPLANT
SUTURE V-LC BRB 180 2/0GR6GS22 (SUTURE) IMPLANT
SYR 10ML LL (SYRINGE) ×3 IMPLANT
SYS LAPSCP GELPORT 120MM (MISCELLANEOUS)
SYSTEM LAPSCP GELPORT 120MM (MISCELLANEOUS) IMPLANT
TOWEL OR 17X26 10 PK STRL BLUE (TOWEL DISPOSABLE) ×3 IMPLANT
TOWEL OR NON WOVEN STRL DISP B (DISPOSABLE) ×3 IMPLANT
TRAY FOLEY W/METER SILVER 16FR (SET/KITS/TRAYS/PACK) ×3 IMPLANT
TROCAR ADV FIXATION 5X100MM (TROCAR) ×3 IMPLANT
TUBING CONNECTING 10 (TUBING) IMPLANT
TUBING CONNECTING 10' (TUBING)
TUBING INSUFFLATION 10FT LAP (TUBING) ×3 IMPLANT

## 2017-01-23 NOTE — Anesthesia Postprocedure Evaluation (Signed)
Anesthesia Post Note  Patient: Emily Osborne  Procedure(s) Performed: Procedure(s) (LRB): XI ROBOT SIGMOIDECTOMY (N/A)     Patient location during evaluation: PACU Anesthesia Type: General Level of consciousness: awake and alert Pain management: pain level controlled Vital Signs Assessment: post-procedure vital signs reviewed and stable Respiratory status: spontaneous breathing, nonlabored ventilation, respiratory function stable and patient connected to nasal cannula oxygen Cardiovascular status: blood pressure returned to baseline and stable Postop Assessment: no signs of nausea or vomiting Anesthetic complications: no    Last Vitals:  Vitals:   01/23/17 1306 01/23/17 1402  BP: 138/79 139/83  Pulse: 77 74  Resp: 16 16  Temp: 36.8 C 36.8 C    Last Pain:  Vitals:   01/23/17 1448  TempSrc:   PainSc: 8                  Emily Osborne

## 2017-01-23 NOTE — Op Note (Signed)
01/23/2017  11:33 AM  PATIENT:  Emily Osborne  56 y.o. female  Patient Care Team: Carollee Herter, Alferd Apa, DO as PCP - General Philemon Kingdom, MD as Consulting Physician (Internal Medicine) Armbruster, Renelda Loma, MD as Consulting Physician (Gastroenterology)  PRE-OPERATIVE DIAGNOSIS:  diverticular disease  POST-OPERATIVE DIAGNOSIS:  diverticular disease  PROCEDURE:  XI ROBOT SIGMOIDECTOMY   Surgeon(s): Leighton Ruff, MD Michael Boston, MD  ASSISTANT: Dr Johney Maine   ANESTHESIA:   local and general  EBL: 100 ml Total I/O In: 1000 [I.V.:1000] Out: 300 [Urine:200; Blood:100]  Delay start of Pharmacological VTE agent (>24hrs) due to surgical blood loss or risk of bleeding:  no  DRAINS: none   SPECIMEN:  Source of Specimen:  Sigmoid colon  DISPOSITION OF SPECIMEN:  PATHOLOGY  COUNTS:  YES  PLAN OF CARE: Admit to inpatient   PATIENT DISPOSITION:  PACU - hemodynamically stable.  INDICATION:    56 y.o. F with multiple episodes of diverticulitis.   I recommended segmental resection:  The anatomy & physiology of the digestive tract was discussed.  The pathophysiology was discussed.  Natural history risks without surgery was discussed.   I worked to give an overview of the disease and the frequent need to have multispecialty involvement.  I feel the risks of no intervention will lead to serious problems that outweigh the operative risks; therefore, I recommended a partial colectomy to remove the pathology.  Laparoscopic & open techniques were discussed.   Risks such as bleeding, infection, abscess, leak, reoperation, possible ostomy, hernia, heart attack, death, and other risks were discussed.  I noted a good likelihood this will help address the problem.   Goals of post-operative recovery were discussed as well.    The patient expressed understanding & wished to proceed with surgery.  OR FINDINGS:   Patient had a mild amount of scarring in the sigmoid colon   The  anastomosis rests 15 cm from the anal verge by rigid proctoscopy.  DESCRIPTION:   Informed consent was confirmed.  The patient underwent general anaesthesia without difficulty.  The patient was positioned appropriately.  VTE prevention in place.  The patient's abdomen was clipped, prepped, & draped in a sterile fashion.  Surgical timeout confirmed our plan.  The patient was positioned in reverse Trendelenburg.  Abdominal entry was gained using a varies needle in the left upper quadrant.  Entry was clean.  I induced carbon dioxide insufflation. I placed a 8 mm robotic port into the right upper quadrant after the abdomen was insufflated. Camera inspection revealed no injury.  Extra ports were carefully placed under direct laparoscopic visualization.   I reflected the greater omentum and the upper abdomen the small bowel in the upper abdomen. The robot was undocked to the patient's left side. Instruments I scorwere placed under direct visualization. ed the base of peritoneum of the right side of the mesentery of the left colon from the ligament of Treitz to the peritoneal reflection of the mid rectum.  I elevated the sigmoid mesentery and enetered into the retro-mesenteric plane. We were able to identify the left ureter and gonadal vessels. We kept those posterior within the retroperitoneum and elevated the left colon mesentery off that. I did isolated IMA pedicle but did not ligate it yet.  I continued distally and got into the avascular plane posterior to the mesorectum. This allowed me to help mobilize the rectum as well by freeing the mesorectum off the sacrum.  I mobilized the peritoneal coverings towards the peritoneal reflection  on both the right and left sides of the rectum.  I could see the right and left ureters and stayed away from them.    I skeletonized the inferior mesenteric artery pedicle. After confirming the left ureter was out of the way, I went ahead and ligated the inferior mesenteric  artery pedicle with bipolavessel sealer well above its takeoff from the aorta.   We ensured hemostasis. I skeletonized the mesorectum at the junction at the proximal rectum using blunt dissection & bipolarobotic vessel sealer .  I mobilized the left colon in a lateral to medial fashion off the line of Toldt up towards the splenic flexure to ensure good mobilization of the left colon to reach into the pelvis.  I divided the remaining mesentery to the level of normal colon using the robotic vessel sealer. I then used a vessel sealer to divide the mesentery at the rectosigmoid junction. Once the mesentery was freed, a blue load robotic stapler was used to transect the rectosigmoid. The abdomen was irrigated. Hemostasis was good. 4 quadrant inspection of the abdomen revealed no signs of intraoperative injury.   The abdomen was desufflated. A Pfannenstiel incision was made suprapubically. An Alexis wound protector was placed through this and the colon was brought out through this wound. The affected portion of sigmoid colon was delineated and a pursestring device was placed proximally to this. A 2-0 Prolene pursestring was placed and the colon was divided using cautery. A 29 mm EEA anvil was then placed and the pursestring was tied tightly around this. This was placed back into the abdomen and an anastomosis was created using the EEA stapler under laparoscopic visualization. There was no tension on the anastomosis. There was no leak when tested with insufflation under water. The omentum was then brought back down over the abdominal contents. The 12 mm port was closed using 2-0 Vicryl interrupted sutures and the laparoscopic Endo Close device. The remaining ports were removed. The abdomen was desufflated. We switched to clean gowns, gloves, instruments and drapes.   The peritoneum of the Pfannenstiel incision was closed using a running 2-0 Vicryl suture. The fascia was closed using 2 #1 PDS sutures in a running  fashion. The subcutaneous tissue was then reapproximated using a running 2-0 Vicryl suture. The skin was closed using a running 4-0 Vicryl subcuticular suture. The remaining port sites were closed using a 4-0 Vicryl suture for the skin. Skin glue was placed over this. A sterile dressing was placed over the extraction site. The patient was then awakened from anesthesia and sent to the postanesthesia care unit in stable condition. All counts were correct per operating room staff.

## 2017-01-23 NOTE — Transfer of Care (Signed)
Immediate Anesthesia Transfer of Care Note  Patient: Emily Osborne  Procedure(s) Performed: Procedure(s): XI ROBOT SIGMOIDECTOMY (N/A)  Patient Location: PACU  Anesthesia Type:General  Level of Consciousness: awake, alert  and oriented  Airway & Oxygen Therapy: Patient Spontanous Breathing and Patient connected to face mask oxygen  Post-op Assessment: Report given to RN and Post -op Vital signs reviewed and stable  Post vital signs: Reviewed and stable  Last Vitals:  Vitals:   01/23/17 0658  BP: 117/77  Pulse: 77  Resp: 18  Temp: 36.8 C    Last Pain:  Vitals:   01/23/17 0658  TempSrc: Oral      Patients Stated Pain Goal: 4 (16/38/46 6599)  Complications: No apparent anesthesia complications

## 2017-01-23 NOTE — Anesthesia Preprocedure Evaluation (Signed)
Anesthesia Evaluation  Patient identified by MRN, date of birth, ID band Patient awake    Reviewed: Allergy & Precautions, NPO status , Patient's Chart, lab work & pertinent test results  History of Anesthesia Complications (+) PONV  Airway Mallampati: II  TM Distance: >3 FB Neck ROM: Full    Dental no notable dental hx.    Pulmonary asthma ,    Pulmonary exam normal breath sounds clear to auscultation       Cardiovascular negative cardio ROS Normal cardiovascular exam Rhythm:Regular Rate:Normal     Neuro/Psych negative neurological ROS  negative psych ROS   GI/Hepatic negative GI ROS, Neg liver ROS,   Endo/Other  Hyperthyroidism   Renal/GU negative Renal ROS  negative genitourinary   Musculoskeletal negative musculoskeletal ROS (+) Fibromyalgia -  Abdominal   Peds negative pediatric ROS (+)  Hematology negative hematology ROS (+)   Anesthesia Other Findings   Reproductive/Obstetrics negative OB ROS                            Anesthesia Physical Anesthesia Plan  ASA: II  Anesthesia Plan: General   Post-op Pain Management:    Induction: Intravenous  PONV Risk Score and Plan: 4 or greater and Ondansetron, Dexamethasone, Propofol, Midazolam and Treatment may vary due to age  Airway Management Planned: Oral ETT  Additional Equipment:   Intra-op Plan:   Post-operative Plan: Extubation in OR  Informed Consent: I have reviewed the patients History and Physical, chart, labs and discussed the procedure including the risks, benefits and alternatives for the proposed anesthesia with the patient or authorized representative who has indicated his/her understanding and acceptance.   Dental advisory given  Plan Discussed with: CRNA  Anesthesia Plan Comments:         Anesthesia Quick Evaluation

## 2017-01-23 NOTE — Anesthesia Procedure Notes (Signed)
Procedure Name: Intubation Date/Time: 01/23/2017 8:46 AM Performed by: Noralyn Pick D Pre-anesthesia Checklist: Patient identified, Emergency Drugs available, Suction available, Patient being monitored and Timeout performed Patient Re-evaluated:Patient Re-evaluated prior to inductionOxygen Delivery Method: Circle system utilized Preoxygenation: Pre-oxygenation with 100% oxygen Intubation Type: IV induction Ventilation: Oral airway inserted - appropriate to patient size and Mask ventilation without difficulty Laryngoscope Size: Mac and 3 Grade View: Grade II Tube type: Oral Tube size: 7.5 mm Number of attempts: 1 Airway Equipment and Method: Stylet and Oral airway Placement Confirmation: ETT inserted through vocal cords under direct vision,  positive ETCO2 and breath sounds checked- equal and bilateral Secured at: 22 cm Tube secured with: Tape Dental Injury: Teeth and Oropharynx as per pre-operative assessment

## 2017-01-23 NOTE — H&P (Signed)
The patient is a 56 year old female who presents with diverticulitis. 56 year old female who presents to the office for evaluation of surgical correction of diverticular disease. She states that her first episode of diverticulitis was in April 2017. CT showed a possible microperforation. This was treated with antibiotics. She then developed a recurrence in February 2018. This did not improve with oral antibiotics and she was hospitalized for several days on IV antibiotics. Since that time she has been slow to recover. She continues to have nausea and abdominal pain as well as frequent diarrhea. Her last colonoscopy was in early 2017. She states that her current pain has resolved.   Past Surgical History Mammie Lorenzo, LPN; 09/27/4130 4:40 AM) Colon Polyp Removal - Colonoscopy Foot Surgery Bilateral. Hysterectomy (not due to cancer) - Partial Knee Surgery Right. Oral Surgery  Diagnostic Studies History Mammie Lorenzo, LPN; 1/0/2725 3:66 AM) Colonoscopy 1-5 years ago Mammogram within last year Pap Smear 1-5 years ago  Allergies Mammie Lorenzo, LPN; 11/21/345 4:25 AM) Aspirin *ANALGESICS - NonNarcotic* Anaphylaxis. NSAIDs Anaphylaxis. Gentamicin Sulfate *AMINOGLYCOSIDES* redness Doxycycline *DERMATOLOGICALS* Nausea, Vomiting. Influenza A (H1N1) Monoval PF *VACCINES* weakness Loratadine *ANTIHISTAMINES* weakness Flagyl *ANTI-INFECTIVE AGENTS - MISC.* redness Zantac *ULCER DRUGS* Swollen lips. Septra *ANTI-INFECTIVE AGENTS - MISC.* Swollen lips. redness TraMADol HCl *ANALGESICS - OPIOID* paranoia  Medication History Mammie Lorenzo, LPN; 04/24/6386 5:64 AM) ALPRAZolam (0.25MG  Tablet, Oral) Active. Advair Diskus (250-50MCG/DOSE Aero Pow Br Act, Inhalation) Active. Albuterol Sulfate ((2.5 MG/3ML)0.083% Nebulized Soln, Inhalation) Active. Atenolol (25MG  Tablet, Oral) Active. Dexilant (60MG  Capsule DR, Oral) Active. Fluticasone Propionate (50MCG/ACT  Suspension, Nasal) Active. Lidocaine (5% Patch, External) Active. Promethazine HCl (25MG  Tablet, Oral) Active. ValACYclovir HCl (1GM Tablet, Oral) Active. Voltaren (1% Gel, Transdermal) Active. Calcium-Vitamin D (600-400MG -UNIT Tablet, Oral) Active. Vitamin D (Cholecalciferol) (1000UNIT Capsule, Oral) Active. Allegra (180MG  Tablet, Oral) Active. Flonase (50MCG/ACT Suspension, Nasal) Active. Iron Combinations (Oral) Active. Tapazole (5MG  Tablet, Oral) Active. Medications Reconciled  Social History Mammie Lorenzo, LPN; 10/20/2949 8:84 AM) Caffeine use Carbonated beverages, Coffee, Tea. No alcohol use No drug use Tobacco use Never smoker.  Family History Mammie Lorenzo, LPN; 08/25/6061 0:16 AM) Alcohol Abuse Brother. Arthritis Mother. Cancer Brother. Colon Polyps Sister. Depression Daughter. Diabetes Mellitus Brother, Father, Sister. Heart Disease Brother, Father, Mother, Sister. Heart disease in female family member before age 79 Heart disease in female family member before age 110 Hypertension Brother, Father, Mother, Sister. Kidney Disease Father. Migraine Headache Daughter, Sister. Respiratory Condition Mother. Thyroid problems Sister.  Pregnancy / Birth History Mammie Lorenzo, LPN; 0/08/930 3:55 AM) Age at menarche 1 years. Age of menopause 46-50 Gravida 2 Length (months) of breastfeeding 3-6 Maternal age 21-35 Para 2  Other Problems Mammie Lorenzo, LPN; 02/19/2201 5:42 AM) Anxiety Disorder Asthma Back Pain Chest pain Diverticulosis Gastroesophageal Reflux Disease Hemorrhoids Migraine Headache Oophorectomy Right. Other disease, cancer, significant illness Thyroid Disease     Review of Systems General Present- Appetite Loss and Chills. Not Present- Fatigue, Fever, Night Sweats, Weight Gain and Weight Loss. Skin Not Present- Change in Wart/Mole, Dryness, Hives, Jaundice, New Lesions, Non-Healing Wounds, Rash  and Ulcer. HEENT Present- Seasonal Allergies and Wears glasses/contact lenses. Not Present- Earache, Hearing Loss, Hoarseness, Nose Bleed, Oral Ulcers, Ringing in the Ears, Sinus Pain, Sore Throat, Visual Disturbances and Yellow Eyes. Respiratory Not Present- Bloody sputum, Chronic Cough, Difficulty Breathing, Snoring and Wheezing. Breast Not Present- Breast Mass, Breast Pain, Nipple Discharge and Skin Changes. Cardiovascular Not Present- Chest Pain, Difficulty Breathing Lying Down, Leg Cramps, Palpitations, Rapid Heart Rate, Shortness of Breath and  Swelling of Extremities. Gastrointestinal Present- Bloating, Change in Bowel Habits, Gets full quickly at meals, Indigestion, Nausea and Rectal Pain. Not Present- Abdominal Pain, Bloody Stool, Chronic diarrhea, Constipation, Difficulty Swallowing, Excessive gas, Hemorrhoids and Vomiting. Female Genitourinary Not Present- Frequency, Nocturia, Painful Urination, Pelvic Pain and Urgency. Musculoskeletal Present- Joint Pain, Muscle Pain, Muscle Weakness and Swelling of Extremities. Not Present- Back Pain and Joint Stiffness. Neurological Not Present- Decreased Memory, Fainting, Headaches, Numbness, Seizures, Tingling, Tremor, Trouble walking and Weakness. Psychiatric Present- Anxiety and Change in Sleep Pattern. Not Present- Bipolar, Depression, Fearful and Frequent crying. Endocrine Present- Cold Intolerance. Not Present- Excessive Hunger, Hair Changes, Heat Intolerance, Hot flashes and New Diabetes. Hematology Not Present- Blood Thinners, Easy Bruising, Excessive bleeding, Gland problems, HIV and Persistent Infections.  BP 117/77   Pulse 77   Temp 98.2 F (36.8 C) (Oral)   Resp 18   Ht 5\' 5"  (1.651 m)   Wt 106.1 kg (234 lb)   SpO2 97%   BMI 38.94 kg/m     Physical Exam  General Mental Status-Alert. General Appearance-Not in acute distress. Build & Nutrition-Well nourished. Posture-Normal posture. Gait-Normal.  Head and  Neck Head-normocephalic, atraumatic with no lesions or palpable masses. Trachea-midline.  Chest and Lung Exam Chest and lung exam reveals -on auscultation, normal breath sounds, no adventitious sounds and normal vocal resonance.  Cardiovascular Cardiovascular examination reveals -normal heart sounds, regular rate and rhythm with no murmurs and no digital clubbing, cyanosis, edema, increased warmth or tenderness.  Abdomen Inspection Inspection of the abdomen reveals - No Hernias. Palpation/Percussion Palpation and Percussion of the abdomen reveal - Soft, Non Tender, No Rigidity (guarding), No hepatosplenomegaly and No Palpable abdominal masses.  Neurologic Neurologic evaluation reveals -alert and oriented x 3 with no impairment of recent or remote memory, normal attention span and ability to concentrate, normal sensation and normal coordination.  Musculoskeletal Normal Exam - Bilateral-Upper Extremity Strength Normal and Lower Extremity Strength Normal.    Assessment & Plan   DIVERTICULITIS, COLON (K57.32) Impression: 56 year old female who presents to the office with recurrent diverticulitis. She had one episode in April 2017 which was treated as an outpatient. She had another episode in February 2018 which was treated with hospitalization. One of these episodes did show possible evidence of a microperforation. Given the appearance on the CT scan, I think she is likely to have another recurrence and I have recommended elective sigmoid resection to be performed in the beginning of June. We have discussed this in detail. I will get a CT scan of her pelvis to evaluate for the amount of inflammation that remains. The surgery and anatomy were described to the patient as well as the risks of surgery and the possible complications.  These include: Bleeding, deep abdominal infections and possible wound complications such as hernia and infection, damage to adjacent structures,  leak of surgical connections, which can lead to other surgeries and possibly an ostomy, possible need for other procedures, such as abscess drains in radiology, possible prolonged hospital stay, possible diarrhea from removal of part of the colon, possible constipation from narcotics, prolonged fatigue/weakness or appetite loss, possible early recurrence of of disease, possible complications of their medical problems such as heart disease or arrhythmias or lung problems, death (less than 1%). I believe the patient understands and wishes to proceed with the surgery.

## 2017-01-24 LAB — CBC
HCT: 35.4 % — ABNORMAL LOW (ref 36.0–46.0)
Hemoglobin: 11.8 g/dL — ABNORMAL LOW (ref 12.0–15.0)
MCH: 29 pg (ref 26.0–34.0)
MCHC: 33.3 g/dL (ref 30.0–36.0)
MCV: 87 fL (ref 78.0–100.0)
PLATELETS: 222 10*3/uL (ref 150–400)
RBC: 4.07 MIL/uL (ref 3.87–5.11)
RDW: 14.3 % (ref 11.5–15.5)
WBC: 12.3 10*3/uL — ABNORMAL HIGH (ref 4.0–10.5)

## 2017-01-24 LAB — BASIC METABOLIC PANEL
Anion gap: 6 (ref 5–15)
BUN: 9 mg/dL (ref 6–20)
CHLORIDE: 106 mmol/L (ref 101–111)
CO2: 27 mmol/L (ref 22–32)
CREATININE: 0.82 mg/dL (ref 0.44–1.00)
Calcium: 8.5 mg/dL — ABNORMAL LOW (ref 8.9–10.3)
GFR calc Af Amer: >60 mL/min (ref >60)
GFR calc non Af Amer: >60 mL/min (ref >60)
Glucose, Bld: 116 mg/dL — ABNORMAL HIGH (ref 65–99)
Potassium: 3.8 mmol/L (ref 3.5–5.1)
Sodium: 139 mmol/L (ref 135–145)

## 2017-01-24 MED ORDER — HYDROMORPHONE HCL 1 MG/ML IJ SOLN
0.5000 mg | INTRAMUSCULAR | Status: DC | PRN
  Administered 2017-01-24 – 2017-01-27 (×7): 1 mg via INTRAVENOUS
  Filled 2017-01-24 (×7): qty 1

## 2017-01-24 MED ORDER — OXYCODONE HCL 5 MG PO TABS
5.0000 mg | ORAL_TABLET | ORAL | Status: DC | PRN
  Administered 2017-01-24 – 2017-01-26 (×11): 10 mg via ORAL
  Administered 2017-01-27 (×2): 5 mg via ORAL
  Filled 2017-01-24 (×7): qty 2
  Filled 2017-01-24: qty 1
  Filled 2017-01-24 (×5): qty 2

## 2017-01-24 NOTE — Care Management Note (Signed)
Case Management Note  Patient Details  Name: Emily Osborne MRN: [id number] Date of Birth: October 01, 1960  Subjective/Objective:                  PRE-OPERATIVE DIAGNOSIS:  diverticular disease  POST-OPERATIVE DIAGNOSIS:  diverticular disease  PROCEDURE:  XI ROBOT SIGMOIDECTOMY  Action/Plan: Date:  January 24, 2017  Chart reviewed for concurrent status and case management needs.  Will continue to follow patient progress.  Discharge Planning: following for needs  Expected discharge date: [id number]  Velva Harman, BSN, [place], [place]   Expected Discharge Date:                  Expected Discharge Plan:  Home/Self Care  In-House Referral:     Discharge planning Services  CM Consult  Post Acute Care Choice:    Choice offered to:     DME Arranged:    DME Agency:     HH Arranged:    HH Agency:     Status of Service:  In process, will continue to follow  If discussed at Long Length of Stay Meetings, dates discussed:    Additional Comments:  Leeroy Cha, RN 01/24/2017, 11:26 AM

## 2017-01-24 NOTE — Progress Notes (Signed)
1 Day Post-Op Robotic Sigmoidectomy  Subjective: Doing well.  Min pain.  No nausea.  Passing flatus.  Ambulated in the hall.  Objective: Vital signs in last 24 hours: Temp:  [97.7 F (36.5 C)-98.6 F (37 C)] 98.3 F (36.8 C) (06/07 0424) Pulse Rate:  [68-80] 69 (06/07 0424) Resp:  [13-22] 20 (06/07 0424) BP: (92-143)/(64-96) 92/64 (06/07 0424) SpO2:  [95 %-100 %] 100 % (06/07 0424)   Intake/Output from previous day: 06/06 0701 - 06/07 0700 In: 1764.2 [P.O.:360; I.V.:1404.2] Out: 3025 [Urine:2925; Blood:100] Intake/Output this shift: No intake/output data recorded.   General appearance: alert and cooperative GI: normal findings: soft, appropriately tender  Incision: no significant drainage  Lab Results:  No results for input(s): WBC, HGB, HCT, PLT in the last 72 hours. BMET No results for input(s): NA, K, CL, CO2, GLUCOSE, BUN, CREATININE, CALCIUM in the last 72 hours. PT/INR No results for input(s): LABPROT, INR in the last 72 hours. ABG No results for input(s): PHART, HCO3 in the last 72 hours.  Invalid input(s): PCO2, PO2  MEDS, Scheduled . acetaminophen  1,000 mg Oral Q6H  . alvimopan  12 mg Oral BID  . atenolol  12.5 mg Oral Daily  . enoxaparin (LOVENOX) injection  40 mg Subcutaneous Q24H  . methimazole  2.5 mg Oral Daily  . mometasone-formoterol  2 puff Inhalation BID  . pantoprazole  40 mg Oral Daily  . saccharomyces boulardii  250 mg Oral BID    Studies/Results: No results found.  Assessment: s/p Procedure(s): XI ROBOT SIGMOIDECTOMY Patient Active Problem List   Diagnosis Date Noted  . Diverticular disease 01/23/2017  . Helicobacter pylori (H. pylori) infection 12/13/2016  . Routine screening for STI (sexually transmitted infection) 12/13/2016  . Clostridium difficile colitis 12/13/2016  . Diverticulitis 10/10/2016  . Lower abdominal pain 09/29/2016  . Palpitations 02/06/2016  . Mild diastolic dysfunction 02/40/9735  . Graves disease  01/02/2016  . Acute diverticulitis 12/07/2015  . Essential hypertension 12/07/2015  . Acute bacterial sinusitis 10/04/2015  . History of colonic polyps 11/15/2014  . Left shoulder pain 09/28/2013  . Left-sided weakness 09/16/2013  . Obesity (BMI 30-39.9) 09/02/2013  . Myalgia 06/21/2011  . POSTMENOPAUSAL STATUS 09/07/2010  . PAIN IN JOINT, MULTIPLE SITES 12/27/2009  . LOW BACK PAIN, CHRONIC 12/27/2009  . FATIGUE 12/27/2009  . VAGINITIS 11/22/2009  . DYSPEPSIA&OTHER [facility] DISORDERS FUNCTION STOMACH 09/01/2009  . NAUSEA 08/03/2009  . FLATULENCE-GAS-BLOATING 08/03/2009  . ABDOMINAL PAIN RIGHT UPPER QUADRANT 07/06/2009  . SUPRAPUBIC PAIN 07/06/2009  . GANGLION CYST, HX OF 07/06/2009  . BACK PAIN 05/04/2009  . Hyperlipidemia 09/16/2008  . UNSPECIFIED MYALGIA AND MYOSITIS 09/16/2008  . Precordial pain 07/27/2008  . GERD 06/08/2008  . HYPERGLYCEMIA, FASTING 06/08/2008  . HELICOBACTER PYLORI GASTRITIS, HX OF 04/05/2008  . OTHER ACUTE REACTIONS TO STRESS 03/16/2008  . ABDOMINAL PAIN, EPIGASTRIC 03/16/2008  . SHOULDER PAIN, RIGHT 10/01/2007  . ELBOW PAIN, RIGHT 10/01/2007  . ELEVATED BLOOD PRESSURE 04/29/2007  . Pain in limb 04/14/2007  . DEPENDENT EDEMA, RIGHT LEG 04/14/2007  . SHINGLES 04/08/2007  . Asthma 03/18/2007  . GANGLION CYST, WRIST, RIGHT 03/18/2007  . BUNIONECTOMY, HX OF 03/18/2007    Expected post op course  Plan: d/c foley Advance diet to fulls and soft foods as tolerated Cont to ambulate PO pain meds   LOS: 1 day     .Rosario Adie, [facility], [place]   01/24/2017 7:17 AM

## 2017-01-24 NOTE — Progress Notes (Signed)
Patient complaining of 10/10 pain, to RLQ, with pink puncture site.  Previous reports of pain had been around 7 to 8 out of 10.  General surgery PA paged twice with no call back.  Dr. Excell Seltzer notified of patients complaints.

## 2017-01-24 NOTE — Progress Notes (Signed)
Patient had a bowel movement.  Entereg discontinued per protocol.

## 2017-01-25 LAB — BASIC METABOLIC PANEL
Anion gap: 6 (ref 5–15)
BUN: 5 mg/dL — AB (ref 6–20)
CALCIUM: 8.6 mg/dL — AB (ref 8.9–10.3)
CO2: 29 mmol/L (ref 22–32)
CREATININE: 0.88 mg/dL (ref 0.44–1.00)
Chloride: 107 mmol/L (ref 101–111)
GFR calc Af Amer: >60 mL/min (ref >60)
GLUCOSE: 106 mg/dL — AB (ref 65–99)
Potassium: 3.8 mmol/L (ref 3.5–5.1)
SODIUM: 142 mmol/L (ref 135–145)

## 2017-01-25 LAB — CBC
HCT: 34.9 % — ABNORMAL LOW (ref 36.0–46.0)
Hemoglobin: 11.4 g/dL — ABNORMAL LOW (ref 12.0–15.0)
MCH: 28.9 pg (ref 26.0–34.0)
MCHC: 32.7 g/dL (ref 30.0–36.0)
MCV: 88.6 fL (ref 78.0–100.0)
PLATELETS: 220 10*3/uL (ref 150–400)
RBC: 3.94 MIL/uL (ref 3.87–5.11)
RDW: 14.5 % (ref 11.5–15.5)
WBC: 10.7 10*3/uL — AB (ref 4.0–10.5)

## 2017-01-25 NOTE — Discharge Instructions (Signed)

## 2017-01-25 NOTE — Progress Notes (Signed)
2 Days Post-Op Robotic Sigmoidectomy  Subjective: Doing well.  Having pain in RLQ at port site.  No nausea.  Passing flatus and having BM's.  Ambulated in the hall.  Objective: Vital signs in last 24 hours: Temp:  [98.7 F (37.1 C)-99.2 F (37.3 C)] 99.2 F (37.3 C) (06/08 0510) Pulse Rate:  [64-79] 79 (06/08 0510) Resp:  [18] 18 (06/08 0510) BP: (100-108)/(57-69) 103/69 (06/08 0510) SpO2:  [95 %-98 %] 95 % (06/08 0510)   Intake/Output from previous day: 06/07 0701 - 06/08 0700 In: 2280 [P.O.:480; I.V.:1800] Out: -  Intake/Output this shift: No intake/output data recorded.   General appearance: alert and cooperative GI: normal findings: soft, appropriately tender  Incision: no significant drainage  Lab Results:   Recent Labs  01/24/17 0707 01/25/17 0638  WBC 12.3* 10.7*  HGB 11.8* 11.4*  HCT 35.4* 34.9*  PLT 222 220   BMET  Recent Labs  01/24/17 0707 01/25/17 0638  NA 139 142  K 3.8 3.8  CL 106 107  CO2 27 29  GLUCOSE 116* 106*  BUN 9 5*  CREATININE 0.82 0.88  CALCIUM 8.5* 8.6*   PT/INR No results for input(s): LABPROT, INR in the last 72 hours. ABG No results for input(s): PHART, HCO3 in the last 72 hours.  Invalid input(s): PCO2, PO2  MEDS, Scheduled . acetaminophen  1,000 mg Oral Q6H  . atenolol  12.5 mg Oral Daily  . enoxaparin (LOVENOX) injection  40 mg Subcutaneous Q24H  . methimazole  2.5 mg Oral Daily  . mometasone-formoterol  2 puff Inhalation BID  . pantoprazole  40 mg Oral Daily  . saccharomyces boulardii  250 mg Oral BID    Studies/Results: No results found.  Assessment: s/p Procedure(s): XI ROBOT SIGMOIDECTOMY Patient Active Problem List   Diagnosis Date Noted  . Diverticular disease 01/23/2017  . Helicobacter pylori (H. pylori) infection 12/13/2016  . Routine screening for STI (sexually transmitted infection) 12/13/2016  . Clostridium difficile colitis 12/13/2016  . Diverticulitis 10/10/2016  . Lower abdominal pain  09/29/2016  . Palpitations 02/06/2016  . Mild diastolic dysfunction 28/31/5176  . Graves disease 01/02/2016  . Acute diverticulitis 12/07/2015  . Essential hypertension 12/07/2015  . Acute bacterial sinusitis 10/04/2015  . History of colonic polyps 11/15/2014  . Left shoulder pain 09/28/2013  . Left-sided weakness 09/16/2013  . Obesity (BMI 30-39.9) 09/02/2013  . Myalgia 06/21/2011  . POSTMENOPAUSAL STATUS 09/07/2010  . PAIN IN JOINT, MULTIPLE SITES 12/27/2009  . LOW BACK PAIN, CHRONIC 12/27/2009  . FATIGUE 12/27/2009  . VAGINITIS 11/22/2009  . DYSPEPSIA&OTHER [facility] DISORDERS FUNCTION STOMACH 09/01/2009  . NAUSEA 08/03/2009  . FLATULENCE-GAS-BLOATING 08/03/2009  . ABDOMINAL PAIN RIGHT UPPER QUADRANT 07/06/2009  . SUPRAPUBIC PAIN 07/06/2009  . GANGLION CYST, HX OF 07/06/2009  . BACK PAIN 05/04/2009  . Hyperlipidemia 09/16/2008  . UNSPECIFIED MYALGIA AND MYOSITIS 09/16/2008  . Precordial pain 07/27/2008  . GERD 06/08/2008  . HYPERGLYCEMIA, FASTING 06/08/2008  . HELICOBACTER PYLORI GASTRITIS, HX OF 04/05/2008  . OTHER ACUTE REACTIONS TO STRESS 03/16/2008  . ABDOMINAL PAIN, EPIGASTRIC 03/16/2008  . SHOULDER PAIN, RIGHT 10/01/2007  . ELBOW PAIN, RIGHT 10/01/2007  . ELEVATED BLOOD PRESSURE 04/29/2007  . Pain in limb 04/14/2007  . DEPENDENT EDEMA, RIGHT LEG 04/14/2007  . SHINGLES 04/08/2007  . Asthma 03/18/2007  . GANGLION CYST, WRIST, RIGHT 03/18/2007  . BUNIONECTOMY, HX OF 03/18/2007    Expected post op course  Plan: Advance diet to soft foods  SL IVF's Cont to ambulate Cont PO pain  meds   LOS: 2 days     .Rosario Adie, [facility], [place]   01/25/2017 7:46 AM

## 2017-01-26 LAB — CBC
HEMATOCRIT: 35.1 % — AB (ref 36.0–46.0)
HEMOGLOBIN: 11.7 g/dL — AB (ref 12.0–15.0)
MCH: 29.2 pg (ref 26.0–34.0)
MCHC: 33.3 g/dL (ref 30.0–36.0)
MCV: 87.5 fL (ref 78.0–100.0)
Platelets: 212 10*3/uL (ref 150–400)
RBC: 4.01 MIL/uL (ref 3.87–5.11)
RDW: 14.2 % (ref 11.5–15.5)
WBC: 9.5 10*3/uL (ref 4.0–10.5)

## 2017-01-26 LAB — BASIC METABOLIC PANEL
ANION GAP: 8 (ref 5–15)
BUN: 5 mg/dL — ABNORMAL LOW (ref 6–20)
CHLORIDE: 105 mmol/L (ref 101–111)
CO2: 26 mmol/L (ref 22–32)
Calcium: 8.8 mg/dL — ABNORMAL LOW (ref 8.9–10.3)
Creatinine, Ser: 0.75 mg/dL (ref 0.44–1.00)
GFR calc non Af Amer: >60 mL/min (ref >60)
Glucose, Bld: 121 mg/dL — ABNORMAL HIGH (ref 65–99)
POTASSIUM: 3.8 mmol/L (ref 3.5–5.1)
Sodium: 139 mmol/L (ref 135–145)

## 2017-01-26 NOTE — Progress Notes (Signed)
3 Days Post-Op   Subjective/Chief Complaint: Still complains of pain. Lost appetite this am and hasn't eaten anything. No vomiting   Objective: Vital signs in last 24 hours: Temp:  [98 F (36.7 C)-98.7 F (37.1 C)] 98 F (36.7 C) (06/09 0520) Pulse Rate:  [68-69] 68 (06/09 0520) Resp:  [15-18] 15 (06/09 0520) BP: (112-139)/(70-88) 139/88 (06/09 0520) SpO2:  [95 %-99 %] 97 % (06/09 0520) Last BM Date: 01/26/17  Intake/Output from previous day: 06/08 0701 - 06/09 0700 In: 490 [P.O.:490] Out: -  Intake/Output this shift: No intake/output data recorded.  General appearance: alert and cooperative Resp: clear to auscultation bilaterally Cardio: regular rate and rhythm GI: soft, moderate tenderness. few bs. incisions look good  Lab Results:   Recent Labs  01/25/17 0638 01/26/17 0759  WBC 10.7* 9.5  HGB 11.4* 11.7*  HCT 34.9* 35.1*  PLT 220 212   BMET  Recent Labs  01/24/17 0707 01/25/17 0638  NA 139 142  K 3.8 3.8  CL 106 107  CO2 27 29  GLUCOSE 116* 106*  BUN 9 5*  CREATININE 0.82 0.88  CALCIUM 8.5* 8.6*   PT/INR No results for input(s): LABPROT, INR in the last 72 hours. ABG No results for input(s): PHART, HCO3 in the last 72 hours.  Invalid input(s): PCO2, PO2  Studies/Results: No results found.  Anti-infectives: Anti-infectives    Start     Dose/Rate Route Frequency Ordered Stop   01/23/17 2000  cefoTEtan (CEFOTAN) 2 g in dextrose 5 % 50 mL IVPB     2 g 100 mL/hr over 30 Minutes Intravenous Every 12 hours 01/23/17 1307 01/23/17 2149   01/23/17 0658  cefoTEtan (CEFOTAN) 2 g in dextrose 5 % 50 mL IVPB     2 g 100 mL/hr over 30 Minutes Intravenous On call to O.R. 01/23/17 1610 01/23/17 0843      Assessment/Plan: s/p Procedure(s): XI ROBOT SIGMOIDECTOMY (N/A) Advance diet. Stay with soft diet for now. Would like to see her be able to eat before discharge Ambulate POD 3  LOS: 3 days    TOTH III,PAUL S 01/26/2017

## 2017-01-27 MED ORDER — OXYCODONE HCL 5 MG PO TABS: 5.0000 mg | ORAL_TABLET | ORAL | 0 refills | Status: DC | PRN

## 2017-01-27 NOTE — Progress Notes (Signed)
4 Days Post-Op   Subjective/Chief Complaint: Feels a little better today. Ate some of her meals yesterday   Objective: Vital signs in last 24 hours: Temp:  [98.1 F (36.7 C)-99.1 F (37.3 C)] 98.1 F (36.7 C) (06/10 0457) Pulse Rate:  [66-75] 66 (06/10 0457) Resp:  [16-18] 17 (06/10 0457) BP: (106-117)/(64-88) 113/85 (06/10 0457) SpO2:  [96 %-97 %] 97 % (06/10 0457) Last BM Date: 01/26/17  Intake/Output from previous day: 06/09 0701 - 06/10 0700 In: 720 [P.O.:720] Out: -  Intake/Output this shift: No intake/output data recorded.  General appearance: alert and cooperative Resp: clear to auscultation bilaterally Cardio: regular rate and rhythm GI: soft, less tender today. good bs. incision looks good  Lab Results:   Recent Labs  01/25/17 0638 01/26/17 0759  WBC 10.7* 9.5  HGB 11.4* 11.7*  HCT 34.9* 35.1*  PLT 220 212   BMET  Recent Labs  01/25/17 0638 01/26/17 0759  NA 142 139  K 3.8 3.8  CL 107 105  CO2 29 26  GLUCOSE 106* 121*  BUN 5* 5*  CREATININE 0.88 0.75  CALCIUM 8.6* 8.8*   PT/INR No results for input(s): LABPROT, INR in the last 72 hours. ABG No results for input(s): PHART, HCO3 in the last 72 hours.  Invalid input(s): PCO2, PO2  Studies/Results: No results found.  Anti-infectives: Anti-infectives    Start     Dose/Rate Route Frequency Ordered Stop   01/23/17 2000  cefoTEtan (CEFOTAN) 2 g in dextrose 5 % 50 mL IVPB     2 g 100 mL/hr over 30 Minutes Intravenous Every 12 hours 01/23/17 1307 01/23/17 2149   01/23/17 0658  cefoTEtan (CEFOTAN) 2 g in dextrose 5 % 50 mL IVPB     2 g 100 mL/hr over 30 Minutes Intravenous On call to O.R. 01/23/17 9774 01/23/17 0843      Assessment/Plan: s/p Procedure(s): XI ROBOT SIGMOIDECTOMY (N/A) Advance diet Discharge  LOS: 4 days    TOTH III,Lennan Malone S 01/27/2017

## 2017-01-29 ENCOUNTER — Other Ambulatory Visit: Payer: Self-pay | Admitting: Family Medicine

## 2017-01-29 NOTE — Telephone Encounter (Signed)
Faxed hardcopy for Alprazolam to [facility] in [place]

## 2017-01-29 NOTE — Telephone Encounter (Signed)
Requesting :   alprazolam Contract    09/07/2014 UDS   Low risk next due on 04/25/2017 Last OV    10/23/16---future appt is on 04/04/2017 Last Refill   #30 no refills on 09/28/2016  Please Advise

## 2017-01-31 ENCOUNTER — Telehealth: Payer: Self-pay

## 2017-01-31 ENCOUNTER — Telehealth: Payer: Self-pay | Admitting: Internal Medicine

## 2017-01-31 NOTE — Telephone Encounter (Signed)
Called and notified of Dr.Gherghe's note to continue medication. Left call back number if any questions.

## 2017-01-31 NOTE — Telephone Encounter (Signed)
She can continue current regimen of Methimazole.

## 2017-01-31 NOTE — Telephone Encounter (Signed)
Patient had to reschedule appointment scheduled for tomorrow due to bleeding from incisions from surgery. Patient is rescheduled for September 18th, if Dr. Cruzita Lederer is fine with this length in between time the patient needs to know if she should continue the medication she as put on or not? Please advise.

## 2017-02-01 ENCOUNTER — Ambulatory Visit: Payer: BC Managed Care – PPO | Admitting: Internal Medicine

## 2017-02-05 ENCOUNTER — Emergency Department (HOSPITAL_BASED_OUTPATIENT_CLINIC_OR_DEPARTMENT_OTHER): Payer: BC Managed Care – PPO

## 2017-02-05 ENCOUNTER — Inpatient Hospital Stay (HOSPITAL_BASED_OUTPATIENT_CLINIC_OR_DEPARTMENT_OTHER)
Admission: EM | Admit: 2017-02-05 | Discharge: 2017-02-08 | DRG: 373 | Disposition: A | Discharge status: Home / Self Care | Payer: BC Managed Care – PPO | Attending: Internal Medicine | Admitting: Internal Medicine

## 2017-02-05 ENCOUNTER — Encounter (HOSPITAL_BASED_OUTPATIENT_CLINIC_OR_DEPARTMENT_OTHER): Payer: Self-pay | Admitting: Emergency Medicine

## 2017-02-05 DIAGNOSIS — Z882 Allergy status to sulfonamides status: Secondary | ICD-10-CM

## 2017-02-05 DIAGNOSIS — Z888 Allergy status to other drugs, medicaments and biological substances status: Secondary | ICD-10-CM

## 2017-02-05 DIAGNOSIS — E0500 Thyrotoxicosis with diffuse goiter without thyrotoxic crisis or storm: Secondary | ICD-10-CM | POA: Diagnosis present

## 2017-02-05 DIAGNOSIS — E785 Hyperlipidemia, unspecified: Secondary | ICD-10-CM | POA: Diagnosis present

## 2017-02-05 DIAGNOSIS — F419 Anxiety disorder, unspecified: Secondary | ICD-10-CM | POA: Diagnosis present

## 2017-02-05 DIAGNOSIS — Z886 Allergy status to analgesic agent status: Secondary | ICD-10-CM

## 2017-02-05 DIAGNOSIS — Z9049 Acquired absence of other specified parts of digestive tract: Secondary | ICD-10-CM | POA: Diagnosis not present

## 2017-02-05 DIAGNOSIS — Z8249 Family history of ischemic heart disease and other diseases of the circulatory system: Secondary | ICD-10-CM

## 2017-02-05 DIAGNOSIS — A0472 Enterocolitis due to Clostridium difficile, not specified as recurrent: Principal | ICD-10-CM | POA: Diagnosis present

## 2017-02-05 DIAGNOSIS — Z881 Allergy status to other antibiotic agents status: Secondary | ICD-10-CM | POA: Diagnosis not present

## 2017-02-05 DIAGNOSIS — K529 Noninfective gastroenteritis and colitis, unspecified: Secondary | ICD-10-CM | POA: Diagnosis not present

## 2017-02-05 DIAGNOSIS — Z6839 Body mass index (BMI) 39.0-39.9, adult: Secondary | ICD-10-CM | POA: Diagnosis not present

## 2017-02-05 DIAGNOSIS — J45909 Unspecified asthma, uncomplicated: Secondary | ICD-10-CM | POA: Diagnosis present

## 2017-02-05 DIAGNOSIS — Z806 Family history of leukemia: Secondary | ICD-10-CM | POA: Diagnosis not present

## 2017-02-05 DIAGNOSIS — Z90710 Acquired absence of both cervix and uterus: Secondary | ICD-10-CM | POA: Diagnosis not present

## 2017-02-05 DIAGNOSIS — I10 Essential (primary) hypertension: Secondary | ICD-10-CM | POA: Diagnosis present

## 2017-02-05 DIAGNOSIS — R197 Diarrhea, unspecified: Secondary | ICD-10-CM | POA: Diagnosis present

## 2017-02-05 DIAGNOSIS — Z79899 Other long term (current) drug therapy: Secondary | ICD-10-CM

## 2017-02-05 DIAGNOSIS — K589 Irritable bowel syndrome without diarrhea: Secondary | ICD-10-CM | POA: Diagnosis present

## 2017-02-05 DIAGNOSIS — Z79891 Long term (current) use of opiate analgesic: Secondary | ICD-10-CM

## 2017-02-05 DIAGNOSIS — Z833 Family history of diabetes mellitus: Secondary | ICD-10-CM

## 2017-02-05 DIAGNOSIS — E663 Overweight: Secondary | ICD-10-CM | POA: Diagnosis present

## 2017-02-05 LAB — COMPREHENSIVE METABOLIC PANEL
ALBUMIN: 3.5 g/dL (ref 3.5–5.0)
ALK PHOS: 84 U/L (ref 38–126)
ALT: 21 U/L (ref 14–54)
ANION GAP: 7 (ref 5–15)
AST: 23 U/L (ref 15–41)
BUN: 11 mg/dL (ref 6–20)
CALCIUM: 8.9 mg/dL (ref 8.9–10.3)
CHLORIDE: 109 mmol/L (ref 101–111)
CO2: 24 mmol/L (ref 22–32)
Creatinine, Ser: 0.87 mg/dL (ref 0.44–1.00)
GFR calc Af Amer: >60 mL/min (ref >60)
GFR calc non Af Amer: >60 mL/min (ref >60)
GLUCOSE: 101 mg/dL — AB (ref 65–99)
POTASSIUM: 3.7 mmol/L (ref 3.5–5.1)
SODIUM: 140 mmol/L (ref 135–145)
Total Bilirubin: 0.3 mg/dL (ref 0.3–1.2)
Total Protein: 7.2 g/dL (ref 6.5–8.1)

## 2017-02-05 LAB — CBC WITH DIFFERENTIAL/PLATELET
BASOS PCT: 0 %
Basophils Absolute: 0 10*3/uL (ref 0.0–0.1)
EOS ABS: 0.6 10*3/uL (ref 0.0–0.7)
Eosinophils Relative: 7 %
HCT: 35.3 % — ABNORMAL LOW (ref 36.0–46.0)
HEMOGLOBIN: 12.1 g/dL (ref 12.0–15.0)
Lymphocytes Relative: 30 %
Lymphs Abs: 2.6 10*3/uL (ref 0.7–4.0)
MCH: 29.4 pg (ref 26.0–34.0)
MCHC: 34.3 g/dL (ref 30.0–36.0)
MCV: 85.7 fL (ref 78.0–100.0)
MONOS PCT: 9 %
Monocytes Absolute: 0.7 10*3/uL (ref 0.1–1.0)
NEUTROS PCT: 54 %
Neutro Abs: 4.5 10*3/uL (ref 1.7–7.7)
Platelets: 335 10*3/uL (ref 150–400)
RBC: 4.12 MIL/uL (ref 3.87–5.11)
RDW: 13.2 % (ref 11.5–15.5)
WBC: 8.4 10*3/uL (ref 4.0–10.5)

## 2017-02-05 LAB — C DIFFICILE QUICK SCREEN W PCR REFLEX
C DIFFICILE (CDIFF) TOXIN: NEGATIVE
C DIFFICLE (CDIFF) ANTIGEN: POSITIVE — AB

## 2017-02-05 LAB — CLOSTRIDIUM DIFFICILE BY PCR: Toxigenic C. Difficile by PCR: POSITIVE — AB

## 2017-02-05 MED ORDER — FENTANYL CITRATE (PF) 100 MCG/2ML IJ SOLN
50.0000 ug | Freq: Once | INTRAMUSCULAR | Status: AC
Start: 2017-02-05 — End: 2017-02-05
  Administered 2017-02-05: 50 ug via INTRAVENOUS
  Filled 2017-02-05: qty 2

## 2017-02-05 MED ORDER — FENTANYL CITRATE (PF) 100 MCG/2ML IJ SOLN
50.0000 ug | Freq: Once | INTRAMUSCULAR | Status: AC
  Administered 2017-02-05: 50 ug via INTRAVENOUS
  Filled 2017-02-05: qty 2

## 2017-02-05 MED ORDER — IOPAMIDOL (ISOVUE-300) INJECTION 61%
100.0000 mL | Freq: Once | INTRAVENOUS | Status: AC | PRN
  Administered 2017-02-05: 100 mL via INTRAVENOUS

## 2017-02-05 NOTE — ED Provider Notes (Signed)
[facility] Provider Note   CSN: [id number] Arrival date & time: 02/05/17  1702   By signing my name below, I, Eunice Blase, attest that this documentation has been prepared under the direction and in the presence of Jake Goodson, Fredia Sorrow, MD. Electronically signed, Eunice Blase, ED Scribe. 02/05/17. 6:31 PM.   History   Chief Complaint Chief Complaint  Patient presents with  . Diarrhea   The history is provided by the patient and medical records. No language interpreter was used.    Emily Osborne is a 56 y.o. female with h/o C. Diff and H. Pylori infections and diverticulitis presenting to the Emergency Department concerning diarrhea since 2 AM this morning. She describes watery diarrhea. On 01/23/2017 pt notes sigmoid colectomy procedure performed to prevent a perforation. Nursing staff states pt's surgeon recommended CT of the abdomen to R/O C. Diff infection today. Pt states she was given "a dose or two" of abx following aforementioned surgery. No other complaints at this time.     Past Medical History:  Diagnosis Date  . Allergy   . Anxiety   . Asthma    pt has inhaler  . C. difficile colitis   . Cataract    bil eyes  . Clostridium difficile colitis 12/13/2016  . Diverticulitis   . Dysrhythmia    palpitations  . Edema   . Elevated blood pressure reading without diagnosis of hypertension    "just elevated when I'm in pain" (12/07/2015)  . Fatty liver   . Fibromyalgia   . Ganglion of joint    right wrist  . GERD (gastroesophageal reflux disease)   . H. pylori infection   . Headache    hx of  . Helicobacter pylori (H. pylori) infection 12/13/2016  . Herpes zoster without mention of complication   . Hyperlipidemia   . Hyperthyroidism   . IBS (irritable bowel syndrome)   . Nontraumatic rupture of Achilles tendon   . Other acute reactions to stress   . Pain in joint, shoulder region   . Pain in joint, upper arm   . Pain in limb   . Personal history  of other diseases of digestive system   . Pneumonia    once  . PONV (postoperative nausea and vomiting)   . Routine screening for STI (sexually transmitted infection) 12/13/2016  . Thyroid disease    hyperthyroidism    Patient Active Problem List   Diagnosis Date Noted  . Diverticular disease 01/23/2017  . Helicobacter pylori (H. pylori) infection 12/13/2016  . Routine screening for STI (sexually transmitted infection) 12/13/2016  . Clostridium difficile colitis 12/13/2016  . Diverticulitis 10/10/2016  . Lower abdominal pain 09/29/2016  . Palpitations 02/06/2016  . Mild diastolic dysfunction 41/74/0814  . Graves disease 01/02/2016  . Acute diverticulitis 12/07/2015  . Essential hypertension 12/07/2015  . Acute bacterial sinusitis 10/04/2015  . History of colonic polyps 11/15/2014  . Left shoulder pain 09/28/2013  . Left-sided weakness 09/16/2013  . Obesity (BMI 30-39.9) 09/02/2013  . Myalgia 06/21/2011  . POSTMENOPAUSAL STATUS 09/07/2010  . PAIN IN JOINT, MULTIPLE SITES 12/27/2009  . LOW BACK PAIN, CHRONIC 12/27/2009  . FATIGUE 12/27/2009  . VAGINITIS 11/22/2009  . DYSPEPSIA&OTHER [facility] DISORDERS FUNCTION STOMACH 09/01/2009  . NAUSEA 08/03/2009  . FLATULENCE-GAS-BLOATING 08/03/2009  . ABDOMINAL PAIN RIGHT UPPER QUADRANT 07/06/2009  . SUPRAPUBIC PAIN 07/06/2009  . GANGLION CYST, HX OF 07/06/2009  . BACK PAIN 05/04/2009  . Hyperlipidemia 09/16/2008  . UNSPECIFIED MYALGIA AND MYOSITIS 09/16/2008  .  Precordial pain 07/27/2008  . GERD 06/08/2008  . HYPERGLYCEMIA, FASTING 06/08/2008  . HELICOBACTER PYLORI GASTRITIS, HX OF 04/05/2008  . OTHER ACUTE REACTIONS TO STRESS 03/16/2008  . ABDOMINAL PAIN, EPIGASTRIC 03/16/2008  . SHOULDER PAIN, RIGHT 10/01/2007  . ELBOW PAIN, RIGHT 10/01/2007  . ELEVATED BLOOD PRESSURE 04/29/2007  . Pain in limb 04/14/2007  . DEPENDENT EDEMA, RIGHT LEG 04/14/2007  . SHINGLES 04/08/2007  . Asthma 03/18/2007  . GANGLION CYST, WRIST, RIGHT  03/18/2007  . BUNIONECTOMY, HX OF 03/18/2007    Past Surgical History:  Procedure Laterality Date  . ABDOMINAL HYSTERECTOMY    . ACHILLES TENDON REPAIR Right 2005  . BUNIONECTOMY Bilateral    Bunionectomy 1983  . COLON SURGERY    . GANGLION CYST EXCISION Right    wrist  . LEFT HEART CATH AND CORONARY ANGIOGRAPHY N/A 10/03/2016   Procedure: Left Heart Cath and Coronary Angiography;  Surgeon: Burnell Blanks, MD;  Location: [facility];  Service: Cardiovascular;  Laterality: N/A;  . MENISCUS REPAIR Right 04/2014  . TUBAL LIGATION    . wisdomteeth extraction      OB History    No data available       Home Medications    Prior to Admission medications   Medication Sig Start Date End Date Taking? Authorizing Provider  acetaminophen (TYLENOL) 500 MG tablet Take 1,000 mg by mouth every 6 (six) hours as needed for moderate pain.     [provider]  albuterol (PROAIR HFA) 108 (90 Base) MCG/ACT inhaler USE 2 PUFFS EVERY 4 HOURS AS NEEDED FOR COUGH, WHEEZE OR SHORTNESS OF BREATH. Patient taking differently: Inhale 2 puffs into the lungs every 4 (four) hours as needed for wheezing or shortness of breath.  09/12/15   Carollee Herter, Yvonne R, DO  albuterol (PROVENTIL) (2.5 MG/3ML) 0.083% nebulizer solution Take 3 mLs (2.5 mg total) by nebulization every 6 (six) hours as needed for wheezing or shortness of breath. 06/21/16   Carollee Herter, Alferd Apa, DO  ALPRAZolam (XANAX) 0.25 MG tablet TAKE 1 TABLET BY MOUTH 3 TIMES A DAY AS NEEDED 01/29/17   Carollee Herter, Alferd Apa, DO  atenolol (TENORMIN) 25 MG tablet TAKE 1 TABLET (25 MG TOTAL) BY MOUTH DAILY. Patient taking differently: Take 12.5 mg by mouth daily. Takes 0.5 tablet 01/11/17   Philemon Kingdom, MD  Blood Glucose Monitoring Suppl (FREESTYLE FREEDOM) KIT 1 Device by Does not apply route daily. 06/08/14   Lowne [name], Yvonne R, DO  CLINPRO 5000 1.1 % PSTE Take 1 application by mouth See admin instructions. Brush teeth nightly  04/19/16   [provider]  DEXILANT 60 MG capsule TAKE ONE CAPSULE BY MOUTH DAILY 11/19/16   Armbruster, Renelda Loma, MD  diclofenac sodium (VOLTAREN) 1 % GEL Apply 2 g topically 2 (two) times daily as needed (pain). For pain    [provider]  fexofenadine (ALLEGRA) 180 MG tablet Take 1 tablet (180 mg total) by mouth daily. 09/14/13   Carollee Herter, Yvonne R, DO  fluticasone (FLONASE) 50 MCG/ACT nasal spray PLACE 2 SPRAYS INTO BOTH NOSTRILS DAILY. Patient taking differently: PLACE 1 SPRAYS INTO BOTH NOSTRILS DAILY. 09/07/16   [name], DO  Fluticasone-Salmeterol (ADVAIR DISKUS) 250-50 MCG/DOSE AEPB Inhale 1 puff into the lungs 2 (two) times daily. Patient taking differently: Inhale 1 puff into the lungs daily.  12/13/15   [name] R, DO  FREESTYLE LITE test strip USE TO CHECK BLOOD SUGAR ONCE A DAY 01/12/16  Carollee Herter, Kendrick Fries R, DO  Iron Combinations (I.L.X. B-12) ELIX Take 1 each by mouth daily as needed (energy). 1 droplet full    [provider]  lidocaine (LIDODERM) 5 % APPLY 1 PATCH ON THE SKIN EVERY 12 HRS REMOVE, DISCARD PATCH WITHIN 12 HRS OR AS DIRECTED Patient taking differently: APPLY 1 PATCH ON THE SKIN EVERY 12 HRS REMOVE, DISCARD PATCH WITHIN 12 HRS AS NEEDED FOR PAIN 06/11/16   Carollee Herter, Alferd Apa, DO  meclizine (ANTIVERT) 32 MG tablet Take 1 tablet (32 mg total) by mouth 3 (three) times daily as needed for dizziness. Patient not taking: Reported on 01/11/2017 11/02/15   Gareth Morgan, MD  methimazole (TAPAZOLE) 5 MG tablet Take 0.5 tablets (2.5 mg total) by mouth daily. Patient taking differently: Take 2.5 mg by mouth daily. Takes 0.5 tablet 10/08/16   Philemon Kingdom, MD  oxyCODONE (OXY IR/ROXICODONE) 5 MG immediate release tablet Take 1-2 tablets (5-10 mg total) by mouth every 4 (four) hours as needed for moderate pain, severe pain or breakthrough pain. 01/27/17   Jovita Kussmaul, MD  promethazine (PHENERGAN) 25 MG tablet Take 1  tablet (25 mg total) by mouth 4 (four) times daily as needed for nausea or vomiting. 10/26/16   [name] R, DO  triamcinolone cream (KENALOG) 0.1 % APPLY TO AFFECTED AREA TWICE A DAY Patient taking differently: APPLY TO AFFECTED AREA TWICE A DAY AS NEEDED FOR RASH 09/14/16   Carollee Herter, Alferd Apa, DO  valACYclovir (VALTREX) 1000 MG tablet Take 1 tablet (1,000 mg total) by mouth 3 (three) times daily. Patient taking differently: Take 1,000 mg by mouth See admin instructions. Take 2g twice a day at first sign of fever blister, then take 1g twice daily until symptoms are gone 03/19/16   Carollee Herter, Alferd Apa, DO    Family History Family History  Problem Relation Age of Onset  . Hypertension Father   . Diabetes Father   . Heart disease Father   . Kidney disease Father        Died, 84  . Leukemia Brother   . Cancer Brother        myleoblastic anemia  . Hyperlipidemia Mother   . Hypertension Mother        Died, 53  . Thyroid disease Mother        Thyroid surgery  . Pernicious anemia Sister   . Thyroid disease Sister        On thyroid Rx  . Lupus Daughter   . Coronary artery disease Brother   . Hypertension Brother   . Colon cancer Neg Hx   . Esophageal cancer Neg Hx   . Rectal cancer Neg Hx   . Stomach cancer Neg Hx   . Pancreatic cancer Neg Hx     Social History Social History  Substance Use Topics  . Smoking status: Never Smoker  . Smokeless tobacco: Never Used  . Alcohol use No     Allergies   Aspirin; Gentamicin; Ibuprofen; Nsaids; Doxycycline; Influenza a (h1n1) monoval vac; Loratadine; Metronidazole; Periactin [cyproheptadine]; Ranitidine hcl; Sulfamethoxazole-trimethoprim; and Tramadol hcl   Review of Systems Review of Systems  Gastrointestinal: Positive for abdominal pain and diarrhea. Negative for nausea and vomiting.  Skin: Positive for wound (healed, from recent surgery).  All other systems reviewed and are negative.    Physical Exam Updated Vital  Signs BP 120/84 (BP Location: Right Arm)   Pulse 75   Temp 98.6 F (37 C) (Oral)  Resp 20   SpO2 97%   Physical Exam  Constitutional: She is oriented to person, place, and time. She appears well-developed and well-nourished.  HENT:  Head: Normocephalic.  Eyes: EOM are normal. Pupils are equal, round, and reactive to light.  Neck: Normal range of motion.  Cardiovascular: Normal rate, regular rhythm and normal heart sounds.   Pulmonary/Chest: Effort normal and breath sounds normal. No respiratory distress.  Abdominal: She exhibits no distension.  Small incision suprapubic area. No sign infection or erythema. 2 well healing scars. Pain to RLQ.  Musculoskeletal: Normal range of motion.  Neurological: She is alert and oriented to person, place, and time.  Psychiatric: She has a normal mood and affect.  Nursing note and vitals reviewed.    ED Treatments / Results  DIAGNOSTIC STUDIES: Oxygen Saturation is 97% on RA, NL by my interpretation.    COORDINATION OF CARE: 6:26 PM-Discussed next steps with pt. Pt verbalized understanding and is agreeable with the plan. Will order labs, medications and imaging.   Labs (all labs ordered are listed, but only abnormal results are displayed) Labs Reviewed  C DIFFICILE QUICK SCREEN W PCR REFLEX    EKG  EKG Interpretation None       Radiology No results found.  Procedures Procedures (including critical care time)  Medications Ordered in ED Medications - No data to display   Initial Impression / Assessment and Plan / ED Course  I have reviewed the triage vital signs and the nursing notes.  Pertinent labs & imaging results that were available during my care of the patient were reviewed by me and considered in my medical decision making (see chart for details).   well-appearing 56 year old female with recurrent diverticulitis. Presenting today, 10 days after surgery for sigmoidectomy. At the time she received antibiotics.  Patient now here with diarrhea. Concerning for C. difficile. Patient's had C. difficile in the past.  No vomiting. No fevers. Patient is having > 10-12 episodes a day.  Patient has tenderness in the right lower quadrant we will do CT given recent surgery.  11:11 PM CT shows edema that is atypical for edema this many days since her procedure.  Recurrent diverticulitis. Cdiff +anigen, neg for antibody. Discsussed wth Dr. Hassell Done. Dr. Marcello Moores will follow as an inpatient. He recommends admission to hospitalist service.  Will continue to control patients pain.   Final Clinical Impressions(s) / ED Diagnoses   Final diagnoses:  None    New Prescriptions New Prescriptions   No medications on file  I personally performed the services described in this documentation, which was scribed in my presence. The recorded information has been reviewed and is accurate.      Macarthur Critchley, MD 02/05/17 [phone]

## 2017-02-05 NOTE — Progress Notes (Signed)
Transfer from  [facility].  Patient is ED physician Dr. Clyda Greener, and accepted for stepdown unit admission at [facility] by me.  56 year old lady with history of recurrent diverticulitis/diarrhea status post recent robotic sigmoidectomy on 01/23/2017 presenting with  one-day history of several episodes of diarrhea and abdominal pain. CT- diverticulitis. Dr. Hassell Done general surgery consult

## 2017-02-05 NOTE — ED Triage Notes (Signed)
Pt reports diarrhea since 230 this morning with 8-10 watery stools. Pt had sigmoid colectomy at [facility] earlier this month. Denies abd pain or fever.

## 2017-02-05 NOTE — ED Notes (Signed)
Pt in CT. Unable to obtain vs at this time.

## 2017-02-06 ENCOUNTER — Encounter (HOSPITAL_COMMUNITY): Payer: Self-pay | Admitting: Family Medicine

## 2017-02-06 DIAGNOSIS — K529 Noninfective gastroenteritis and colitis, unspecified: Secondary | ICD-10-CM

## 2017-02-06 LAB — CBC WITH DIFFERENTIAL/PLATELET
Basophils Absolute: 0 10*3/uL (ref 0.0–0.1)
Basophils Relative: 1 %
Eosinophils Absolute: 0.5 10*3/uL (ref 0.0–0.7)
Eosinophils Relative: 7 %
HCT: 35.2 % — ABNORMAL LOW (ref 36.0–46.0)
Hemoglobin: 11.7 g/dL — ABNORMAL LOW (ref 12.0–15.0)
Lymphocytes Relative: 28 %
Lymphs Abs: 2.2 10*3/uL (ref 0.7–4.0)
MCH: 28.6 pg (ref 26.0–34.0)
MCHC: 33.2 g/dL (ref 30.0–36.0)
MCV: 86.1 fL (ref 78.0–100.0)
Monocytes Absolute: 0.4 10*3/uL (ref 0.1–1.0)
Monocytes Relative: 5 %
Neutro Abs: 4.7 10*3/uL (ref 1.7–7.7)
Neutrophils Relative %: 59 %
Platelets: 313 10*3/uL (ref 150–400)
RBC: 4.09 MIL/uL (ref 3.87–5.11)
RDW: 13.6 % (ref 11.5–15.5)
WBC: 7.9 10*3/uL (ref 4.0–10.5)

## 2017-02-06 MED ORDER — METHIMAZOLE 5 MG PO TABS
2.5000 mg | ORAL_TABLET | Freq: Every day | ORAL | Status: DC
  Administered 2017-02-06 – 2017-02-08 (×3): 2.5 mg via ORAL
  Filled 2017-02-06 (×3): qty 1

## 2017-02-06 MED ORDER — OXYCODONE HCL 5 MG PO TABS: 5.0000 mg | ORAL_TABLET | ORAL | Status: DC | PRN

## 2017-02-06 MED ORDER — MORPHINE SULFATE (PF) 2 MG/ML IV SOLN
2.0000 mg | INTRAVENOUS | Status: DC | PRN
  Administered 2017-02-06 – 2017-02-07 (×2): 2 mg via INTRAVENOUS
  Filled 2017-02-06 (×2): qty 1

## 2017-02-06 MED ORDER — ACETAMINOPHEN 325 MG PO TABS
650.0000 mg | ORAL_TABLET | Freq: Four times a day (QID) | ORAL | Status: DC | PRN
  Administered 2017-02-06 – 2017-02-08 (×4): 650 mg via ORAL
  Filled 2017-02-06 (×4): qty 2

## 2017-02-06 MED ORDER — ATENOLOL 25 MG PO TABS
12.5000 mg | ORAL_TABLET | Freq: Every day | ORAL | Status: DC
  Administered 2017-02-06 – 2017-02-08 (×3): 12.5 mg via ORAL
  Filled 2017-02-06 (×3): qty 1

## 2017-02-06 MED ORDER — ENOXAPARIN SODIUM 40 MG/0.4ML ~~LOC~~ SOLN
40.0000 mg | SUBCUTANEOUS | Status: DC
  Filled 2017-02-06 (×3): qty 0.4

## 2017-02-06 MED ORDER — ALPRAZOLAM 0.25 MG PO TABS
0.2500 mg | ORAL_TABLET | Freq: Three times a day (TID) | ORAL | Status: DC | PRN
  Administered 2017-02-06 – 2017-02-07 (×3): 0.25 mg via ORAL
  Filled 2017-02-06 (×3): qty 1

## 2017-02-06 MED ORDER — FENTANYL CITRATE (PF) 100 MCG/2ML IJ SOLN
50.0000 ug | Freq: Once | INTRAMUSCULAR | Status: AC
  Administered 2017-02-06: 50 ug via INTRAVENOUS
  Filled 2017-02-06: qty 2

## 2017-02-06 MED ORDER — MOMETASONE FURO-FORMOTEROL FUM 200-5 MCG/ACT IN AERO
2.0000 | INHALATION_SPRAY | Freq: Two times a day (BID) | RESPIRATORY_TRACT | Status: DC
  Administered 2017-02-06 – 2017-02-08 (×3): 2 via RESPIRATORY_TRACT
  Filled 2017-02-06: qty 8.8

## 2017-02-06 MED ORDER — PROMETHAZINE HCL 25 MG PO TABS
25.0000 mg | ORAL_TABLET | Freq: Four times a day (QID) | ORAL | Status: DC | PRN
  Administered 2017-02-07: 25 mg via ORAL
  Filled 2017-02-06: qty 1

## 2017-02-06 MED ORDER — VANCOMYCIN 50 MG/ML ORAL SOLUTION
125.0000 mg | Freq: Four times a day (QID) | ORAL | Status: DC
  Administered 2017-02-06 – 2017-02-08 (×7): 125 mg via ORAL
  Filled 2017-02-06 (×9): qty 2.5

## 2017-02-06 NOTE — H&P (Signed)
History and Physical  Patient Name: Emily Osborne     URK:[id number]    DOB: 02-17-1961    DOA: 02/05/2017 PCP: [name], DO  Patient coming from: Home --> [facility] --> [facility]  Chief Complaint: Diarrhea, cramps      HPI: Emily Osborne is a 56 y.o. female with a past medical history significant for asthma, HTN, Graves disease on methimazole, as well as Cdiff and recurrent diverticulitis who presents with diarrhea.  The patient recently underwent robotic sigmoidectomy with Dr. Marcello Moores on 6/6 for recurrent diverticulitis.  Her post-op course was unremarkable, she was discharged home.  At home, she reports good pain control with oxycodone, advancing diet appropriately, and then Sunday, a few episodes of loose stools and cramps with BM in the morning.  This resolved in the afternoon, Monday, had no issues.  Then Tuesday AM she woke before 3AM with urgency, had >10 watery BMs through the morning.  By afternoon, the pain with BM was severe, so she called her surgical nurse and was told to go to the ER.  She had no fever.  Cramps only with BM, no aching between movements.  She had no pus in stool, blood.  She has taken no Miralax since last week.  ED course: -Afebrile, heart rate 75, respirations and pulse ox normal, BP 120/84 -Na 140, K 3.7, Cr 0.87, WBC 8.4K, Hgb 12.1 -Cdiff stool testing showed antigen+ and PCR+, toxin negative -CT abdomen and pelvis with contrast showed a segment of inflamed colon -The case was discussed with General Surgery who recommended admission to the hospitalist service with General Surgery consult     The patient reports her issues with C diff started within the last year in the context of H. pylori. It sounds like she had numerous intolerances to antibiotics requiring multiple rounds of antibiotics to treat her H. pylori, and then finally all the antibiotics caused an episode of C. difficile in February. She claims this was her first episode and was treated with  oral vancomycin.  She now follows with infectious disease, Dr. Tommy Medal, regarding her H. Pylori.       ROS: Review of Systems  Constitutional: Negative for chills, fever and malaise/fatigue.  Gastrointestinal: Positive for abdominal pain, diarrhea and nausea. Negative for blood in stool, constipation and melena.  All other systems reviewed and are negative.         Past Medical History:  Diagnosis Date  . Allergy   . Anxiety   . Asthma    pt has inhaler  . C. difficile colitis   . Cataract    bil eyes  . Clostridium difficile colitis 12/13/2016  . Diverticulitis   . Dysrhythmia    palpitations  . Edema   . Elevated blood pressure reading without diagnosis of hypertension    "just elevated when I'm in pain" (12/07/2015)  . Fatty liver   . Fibromyalgia   . Ganglion of joint    right wrist  . GERD (gastroesophageal reflux disease)   . H. pylori infection   . Headache    hx of  . Helicobacter pylori (H. pylori) infection 12/13/2016  . Herpes zoster without mention of complication   . Hyperlipidemia   . Hyperthyroidism   . IBS (irritable bowel syndrome)   . Nontraumatic rupture of Achilles tendon   . Other acute reactions to stress   . Pain in joint, shoulder region   . Pain in joint, upper arm   . Pain  in limb   . Personal history of other diseases of digestive system   . Pneumonia    once  . PONV (postoperative nausea and vomiting)   . Routine screening for STI (sexually transmitted infection) 12/13/2016  . Thyroid disease    hyperthyroidism    Past Surgical History:  Procedure Laterality Date  . ABDOMINAL HYSTERECTOMY    . ACHILLES TENDON REPAIR Right 2005  . BUNIONECTOMY Bilateral    Bunionectomy 1983  . COLON SURGERY    . GANGLION CYST EXCISION Right    wrist  . LEFT HEART CATH AND CORONARY ANGIOGRAPHY N/A 10/03/2016   Procedure: Left Heart Cath and Coronary Angiography;  Surgeon: Burnell Blanks, MD;  Location: [facility];  Service:  Cardiovascular;  Laterality: N/A;  . MENISCUS REPAIR Right 04/2014  . TUBAL LIGATION    . wisdomteeth extraction      Social History: Patient lives with family.  The patient walks unassisted.  Nonsmoker.  Is an Therapist, sports.  Allergies  Allergen Reactions  . Aspirin Other (See Comments)    REACTION: anaphylaxis  . Gentamicin Other (See Comments)    Eye drops turned the sclera bright red  . Ibuprofen Other (See Comments)    REACTION: anaphylaxsis  . Nsaids Other (See Comments)    REACTION: anaphylaxis  . Doxycycline Other (See Comments)    REACTION: severe nausea/vomiting  . Influenza A (H1n1) Monoval Vac Other (See Comments)    REACTION: sick for 3 weeks  . Loratadine Other (See Comments)    Fatigue/weakness  . Metronidazole Other (See Comments)    REACTION: red face/swelling  . Periactin [Cyproheptadine] Other (See Comments)    paranoid   . Ranitidine Hcl Other (See Comments)    REACTION: Lips turned red /peel  . Sulfamethoxazole-Trimethoprim Other (See Comments)    REACTION: face red/peel  . Tramadol Hcl Other (See Comments)    REACTION: paranoid    Family history: family history includes Cancer in her brother; Coronary artery disease in her brother; Diabetes in her father; Heart disease in her father; Hyperlipidemia in her mother; Hypertension in her brother, father, and mother; Kidney disease in her father; Leukemia in her brother; Lupus in her daughter; Pernicious anemia in her sister; Thyroid disease in her mother and sister.  Prior to Admission medications   Medication Sig Start Date End Date Taking? Authorizing Provider  acetaminophen (TYLENOL) 500 MG tablet Take 1,000 mg by mouth every 6 (six) hours as needed for moderate pain.    Yes [provider]  albuterol (PROAIR HFA) 108 (90 Base) MCG/ACT inhaler USE 2 PUFFS EVERY 4 HOURS AS NEEDED FOR COUGH, WHEEZE OR SHORTNESS OF BREATH. Patient taking differently: Inhale 2 puffs into the lungs every 4 (four) hours as needed  for wheezing or shortness of breath.  09/12/15  Yes Lowne [name], Yvonne R, DO  ALPRAZolam (XANAX) 0.25 MG tablet TAKE 1 TABLET BY MOUTH 3 TIMES A DAY AS NEEDED Patient taking differently: TAKE 1 TABLET BY MOUTH 3 TIMES A DAY AS NEEDED for anxiety 01/29/17  Yes Lowne [name], Yvonne R, DO  atenolol (TENORMIN) 25 MG tablet TAKE 1 TABLET (25 MG TOTAL) BY MOUTH DAILY. Patient taking differently: Take 12.5 mg by mouth daily. Takes 0.5 tablet 01/11/17  Yes Philemon Kingdom, MD  CLINPRO 5000 1.1 % PSTE Take 1 application by mouth See admin instructions. Brush teeth nightly 04/19/16  Yes [provider]  DEXILANT 60 MG capsule TAKE ONE CAPSULE BY MOUTH DAILY 11/19/16  Yes  Armbruster, Renelda Loma, MD  diclofenac sodium (VOLTAREN) 1 % GEL Apply 2 g topically 2 (two) times daily as needed (pain). For pain   Yes [provider]  fexofenadine (ALLEGRA) 180 MG tablet Take 1 tablet (180 mg total) by mouth daily. 09/14/13  Yes [name] R, DO  fluticasone (FLONASE) 50 MCG/ACT nasal spray PLACE 2 SPRAYS INTO BOTH NOSTRILS DAILY. Patient taking differently: PLACE 1 SPRAYS INTO BOTH NOSTRILS DAILY. 09/07/16  Yes [name] R, DO  Fluticasone-Salmeterol (ADVAIR DISKUS) 250-50 MCG/DOSE AEPB Inhale 1 puff into the lungs 2 (two) times daily. Patient taking differently: Inhale 1 puff into the lungs daily.  12/13/15  Yes [name] R, DO  Iron Combinations (I.L.X. B-12) ELIX Take 1 each by mouth daily as needed (energy). 1 droplet full   Yes [provider]  lidocaine (LIDODERM) 5 % APPLY 1 PATCH ON THE SKIN EVERY 12 HRS REMOVE, DISCARD PATCH WITHIN 12 HRS OR AS DIRECTED Patient taking differently: APPLY 1 PATCH ON THE SKIN EVERY 12 HRS REMOVE, DISCARD PATCH WITHIN 12 HRS AS NEEDED FOR PAIN 06/11/16  Yes [name] R, DO  methimazole (TAPAZOLE) 5 MG tablet Take 0.5 tablets (2.5 mg total) by mouth daily. Patient taking differently: Take 2.5 mg by mouth daily. Takes 0.5  tablet 10/08/16  Yes Philemon Kingdom, MD  oxyCODONE (OXY IR/ROXICODONE) 5 MG immediate release tablet Take 1-2 tablets (5-10 mg total) by mouth every 4 (four) hours as needed for moderate pain, severe pain or breakthrough pain. 01/27/17  Yes Jovita Kussmaul, MD  promethazine (PHENERGAN) 25 MG tablet Take 1 tablet (25 mg total) by mouth 4 (four) times daily as needed for nausea or vomiting. 10/26/16  Yes [name] R, DO  triamcinolone cream (KENALOG) 0.1 % APPLY TO AFFECTED AREA TWICE A DAY Patient taking differently: APPLY TO AFFECTED AREA TWICE A DAY AS NEEDED FOR RASH 09/14/16  Yes [name] R, DO  albuterol (PROVENTIL) (2.5 MG/3ML) 0.083% nebulizer solution Take 3 mLs (2.5 mg total) by nebulization every 6 (six) hours as needed for wheezing or shortness of breath. 06/21/16   [name], DO  Blood Glucose Monitoring Suppl (FREESTYLE FREEDOM) KIT 1 Device by Does not apply route daily. 06/08/14   [name], DO  FREESTYLE LITE test strip USE TO CHECK BLOOD SUGAR ONCE A DAY 01/12/16   Carollee Herter, Alferd Apa, DO  valACYclovir (VALTREX) 1000 MG tablet Take 1 tablet (1,000 mg total) by mouth 3 (three) times daily. Patient taking differently: Take 1,000 mg by mouth See admin instructions. Take 2g twice a day at first sign of fever blister, then take 1g twice daily until symptoms are gone 03/19/16   Carollee Herter, Alferd Apa, DO       Physical Exam: BP 129/88 (BP Location: Right Arm)   Pulse 63   Temp 98.2 F (36.8 C) (Oral)   Resp 18   Ht 5' 5"  (1.651 m)   Wt 107.8 kg (237 lb 10.5 oz)   SpO2 98%   BMI 39.55 kg/m  General appearance: Well-developed, overweight adult female, alert and in no acute distress.   Eyes: Anicteric, conjunctiva pink, lids and lashes normal. PERRL.    ENT: No nasal deformity, discharge, epistaxis.  Hearing normal. OP moist without lesions.   Skin: Warm and dry. Surgical sites are clean and dry, appear well healed.  No suspicious rashes or  lesions. Cardiac: RRR, nl S1-S2, no murmurs appreciated.  Capillary refill is brisk.   Respiratory: Normal respiratory rate and rhythm.  CTAB without rales or wheezes. Abdomen: Abdomen soft.  Mild TTP, nonfocal, no rebound. No ascites, distension, hepatosplenomegaly.   MSK: No deformities or effusions.  No cyanosis or clubbing. Neuro: Cranial nerves grossly normal. Speech is fluent.  Muscle strength normal.    Psych: Sensorium intact and responding to questions, attention normal.  Behavior appropriate.  Affect normal.  Judgment and insight appear normal.     Labs on Admission:  I have personally reviewed following labs and imaging studies: CBC:  Recent Labs Lab 02/05/17 1812  WBC 8.4  NEUTROABS 4.5  HGB 12.1  HCT 35.3*  MCV 85.7  PLT 250   Basic Metabolic Panel:  Recent Labs Lab 02/05/17 1812  NA 140  K 3.7  CL 109  CO2 24  GLUCOSE 101*  BUN 11  CREATININE 0.87  CALCIUM 8.9   GFR: Estimated Creatinine Clearance: 87.1 mL/min (by C-G formula based on SCr of 0.87 mg/dL).  Liver Function Tests:  Recent Labs Lab 02/05/17 1812  AST 23  ALT 21  ALKPHOS 84  BILITOT 0.3  PROT 7.2  ALBUMIN 3.5     Recent Results (from the past 240 hour(s))  C difficile quick scan w PCR reflex     Status: Abnormal   Collection Time: 02/05/17  5:10 PM  Result Value Ref Range Status   C Diff antigen POSITIVE (A) NEGATIVE Final   C Diff toxin NEGATIVE NEGATIVE Final   C Diff interpretation Results are indeterminate. See PCR results.  Final    Comment: PERFORMED AT [facility] Performed at [facility], [place] [address], [place], [place] [address]   Clostridium Difficile by PCR     Status: Abnormal   Collection Time: 02/05/17  5:10 PM  Result Value Ref Range Status   Toxigenic C Difficile by pcr POSITIVE (A) NEGATIVE Final    Comment: Positive for toxigenic C. difficile with little to no toxin production. Only treat if clinical presentation suggests symptomatic  illness. Performed at [facility], [place] [address], [place],  [address]          Radiological Exams on Admission: Personally reviewed CT report; discussed with radiology: Ct Abdomen Pelvis W Contrast  Result Date: 02/05/2017 CLINICAL DATA:  Right-sided abdominal pain and diarrhea since 2 a.m. History of sigmoid colectomy 13 days ago. This was performed secondary to diverticulitis. EXAM: CT ABDOMEN AND PELVIS WITH CONTRAST TECHNIQUE: Multidetector CT imaging of the abdomen and pelvis was performed using the standard protocol following bolus administration of intravenous contrast. CONTRAST:  143m ISOVUE-300 IOPAMIDOL (ISOVUE-300) INJECTION 61% COMPARISON:  11/28/2016 pelvic CT.  10/10/2016 abdominopelvic CT. FINDINGS: Lower chest: Clear lung bases.  Borderline cardiomegaly. Hepatobiliary: Hepatomegaly, 20 cm craniocaudal. Mild hepatic steatosis. Normal gallbladder, without biliary ductal dilatation. Pancreas: Normal, without mass or ductal dilatation. Spleen: Normal in size, without focal abnormality. Adrenals/Urinary Tract: Normal adrenal glands. Upper pole left renal too small to characterize lesion. Normal right kidney, without hydronephrosis. Normal urinary bladder. Stomach/Bowel: Normal stomach, without wall thickening. Interval surgical sutures at the rectosigmoid junction. There is mild edema surrounding the surgical site, including on image 70/series 2. Diverticula identified in the immediately proximal sigmoid. No extraluminal gas or pericolonic fluid collection. Diverticula throughout the remainder of the colon. Normal terminal ileum and appendix. Normal small bowel. Vascular/Lymphatic: Normal caliber of the aorta and branch vessels. No abdominopelvic adenopathy. Reproductive: Hysterectomy.  No adnexal mass. Other: No significant free  fluid. Edema within the femoral rectus musculature is likely postoperative. No abscess. Musculoskeletal: Osteitis pubis. Degenerate disc disease at the  lumbosacral junction. IMPRESSION: 1. Interval surgical changes at the rectosigmoid junction. Surrounding edema is felt to be greater than typically seen 13 days postop. Given adjacent residual sigmoid diverticula, suspicious for recurrent diverticulitis. No abscess or other acute complication identified. 2. Hepatomegaly and hepatic steatosis. Electronically Signed   By: Abigail Miyamoto M.D.   On: 02/05/2017 20:48        Assessment/Plan  1. Colitis:  Discussed with Radiology, length of colon segment involved militates against diverticulitis, in favor of C diff, but is nondiagnostic.  Cdiff testing likewise would be expected to show colonization in this patient so this is not really diagnostic.   -Our team will discuss with Dr. Marcello Moores re: prolonged vancomycin taper later this morning, then outpatient follow up with ID -Monitor for further stools -Clears -Stop PPI  2. Hyeprtension:  -Continue atenolol  3. Graves disease:  -Continue methimazole  4. Anxiety:  -May have alprazolam PRN  5. Asthma:  -Continue Advair  6. Recent sigmoidectomy:  -Continue oxycodone, phenergan PRN       DVT prophylaxis: Lovenox  Code Status: FULL  Family Communication: Sisters at bedside  Disposition Plan: Anticipate Clears, likely home today with oral vancomycin taper Consults called: None At the point of initial evaluation, it is my clinical opinion that admission for OBSERVATION is reasonable and necessary because the patient's presenting complaints in the context of their chronic conditions represent sufficient risk of deterioration or significant morbidity to constitute reasonable grounds for close observation in the hospital setting, but that the patient may be medically stable for discharge from the hospital within 24 to 48 hours.    Medical decision making: Patient seen at 5:15 AM on 02/06/2017.  What exists of the patient's chart was reviewed in depth and summarized above.  Clinical condition:  stable.        Edwin Dada [facility] Pager [phone]

## 2017-02-06 NOTE — ED Notes (Signed)
Report to [facility].  

## 2017-02-06 NOTE — ED Notes (Signed)
Sinus on monitor

## 2017-02-06 NOTE — Progress Notes (Signed)
Patient has a unhealing wound at lower abdominal fold from incisional port site.. Has moderate amount of drainage of redish yellow.  Unable to place in wound column of Assessment. bec. Is a surgical incision from previous 6/6 hospitalization.  Will report to oncoming nurse.

## 2017-02-06 NOTE — Progress Notes (Signed)
Pt's results and CT reviewed.  I see no signs of post op complications on CT scan.  Agree with treatment plan as proposed by medical team.  Pt will f/u in my office and has apt scheduled on 6/25 at 12 noon.

## 2017-02-06 NOTE — Progress Notes (Signed)
Patient ID: Emily Osborne, female   DOB: Feb 25, 1961, 56 y.o.   MRN: [id number]  PROGRESS NOTE    Emily Osborne  GDJ:[id number] DOB: [birth year] DOA: 02/05/2017 PCP: [name], DO   Brief Narrative:   56 y.o. female with a past medical history significant for asthma, HTN, Graves disease on methimazole, as well as Cdiff and recurrent diverticulitis  status post robotic sigmoidectomy with Dr. Marcello Moores on 01/23/17  who presents with diarrhea. CAT scan of the abdomen was discussed with radiology and the radiologist was in favor of C. difficile colitis rather than diverticulitis. Surgical evaluation is pending. Patient is still having diarrhea. C. difficile antigen and PCR were positive but toxin was negative.   Assessment & Plan:   Principal Problem:   Colitis Active Problems:   Asthma   Essential hypertension   Graves disease   Diarrhea  1. 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 difficile colitis rather than diverticulitis. C. difficile antigen and PCR positive but toxin is negative. I discussed the case with Dr. Megan Salon from infectious diseases and since the patient is having symptoms of diarrhea, patient will be treated with oral vancomycin 125 mg 4 times a day for at least 2 weeks as this is the first recurrence of C. difficile . -Awaiting general surgical evaluation with Dr. Marcello Moores. Left a message to his office. Hold off on broad-spectrum antibiotics till evaluation by general surgery. -Advance diet as tolerated -Stop PPI  2. Hyeprtension:  -Continue atenolol. Monitor blood pressure  3. Graves disease:  -Continue methimazole. Outpatient follow-up  4. Anxiety:  -May have alprazolam PRN  5. Asthma:  -Continue Advair. Stable  6. Recent sigmoidectomy:  -Continue oxycodone, phenergan PRN. Awaiting general surgery evaluation and  recommendations.  DVT prophylaxis: Lovenox Code Status:  Full Family Communication: Discussed with family including sisters present at bedside Disposition Plan: Home in 1-2 days  Consultants: Gen. surgery/Dr. Lowella Dandy to Dr. Megan Salon from infectious diseases on phone  Procedures: None  Antimicrobials: None   Subjective: Patient seen and examined at bedside. She still complaining of diarrhea, has had 4 episodes of loose watery bowel movements so far this morning. No fever, chest pain, palpitations.  Objective: Vitals:   02/06/17 0319 02/06/17 0355 02/06/17 0443 02/06/17 1041  BP: 128/87 (!) 132/96 129/88 123/79  Pulse:  68 63 63  Resp:  18 18 16   Temp: 98.1 F (36.7 C)  98.2 F (36.8 C)   TempSrc:   Oral   SpO2:  99% 98% 99%  Weight:   107.8 kg (237 lb 10.5 oz)   Height:   5\' 5"  (1.651 m)     Intake/Output Summary (Last 24 hours) at 02/06/17 1324 Last data filed at 02/06/17 1000  Gross per 24 hour  Intake                0 ml  Output                0 ml  Net                0 ml   Filed Weights   02/06/17 0443  Weight: 107.8 kg (237 lb 10.5 oz)    Examination:  General exam: Appears calm and comfortable  Respiratory system: Bilateral decreased breath sound at bases Cardiovascular system: S1 & S2 heard, RRR. No JVD, murmurs, rubs, gallops. Gastrointestinal system:  Abdomen is nondistended, soft and Mildly tender over the lower quadrant more on the right side. There is a small nonhealing wound on the lower quadrant with no current discharge or surrounding erythema. Normal bowel sounds heard. Central nervous system: Alert and oriented. No focal neurological deficits. Moving extremities Extremities: No cyanosis, clubbing, edema  Skin: No other rashes, lesions or ulcers Psychiatry: Judgement and insight appear normal. Mood & affect appropriate.     Data Reviewed: I have personally reviewed following labs and imaging studies  CBC:  Recent Labs Lab  02/05/17 1812 02/06/17 0934  WBC 8.4 7.9  NEUTROABS 4.5 4.7  HGB 12.1 11.7*  HCT 35.3* 35.2*  MCV 85.7 86.1  PLT 335 656   Basic Metabolic Panel:  Recent Labs Lab 02/05/17 1812  NA 140  K 3.7  CL 109  CO2 24  GLUCOSE 101*  BUN 11  CREATININE 0.87  CALCIUM 8.9   GFR: Estimated Creatinine Clearance: 87.1 mL/min (by C-G formula based on SCr of 0.87 mg/dL). Liver Function Tests:  Recent Labs Lab 02/05/17 1812  AST 23  ALT 21  ALKPHOS 84  BILITOT 0.3  PROT 7.2  ALBUMIN 3.5   No results for input(s): LIPASE, AMYLASE in the last 168 hours. No results for input(s): AMMONIA in the last 168 hours. Coagulation Profile: No results for input(s): INR, PROTIME in the last 168 hours. Cardiac Enzymes: No results for input(s): CKTOTAL, CKMB, CKMBINDEX, TROPONINI in the last 168 hours. BNP (last 3 results) No results for input(s): PROBNP in the last 8760 hours. HbA1C: No results for input(s): HGBA1C in the last 72 hours. CBG: No results for input(s): GLUCAP in the last 168 hours. Lipid Profile: No results for input(s): CHOL, HDL, LDLCALC, TRIG, CHOLHDL, LDLDIRECT in the last 72 hours. Thyroid Function Tests: No results for input(s): TSH, T4TOTAL, FREET4, T3FREE, THYROIDAB in the last 72 hours. Anemia Panel: No results for input(s): VITAMINB12, FOLATE, FERRITIN, TIBC, IRON, RETICCTPCT in the last 72 hours. Sepsis Labs: No results for input(s): PROCALCITON, LATICACIDVEN in the last 168 hours.  Recent Results (from the past 240 hour(s))  C difficile quick scan w PCR reflex     Status: Abnormal   Collection Time: 02/05/17  5:10 PM  Result Value Ref Range Status   C Diff antigen POSITIVE (A) NEGATIVE Final   C Diff toxin NEGATIVE NEGATIVE Final   C Diff interpretation Results are indeterminate. See PCR results.  Final    Comment: PERFORMED AT [facility] Performed at [facility], [place] [address], [place], [place] [address]   Clostridium Difficile by PCR      Status: Abnormal   Collection Time: 02/05/17  5:10 PM  Result Value Ref Range Status   Toxigenic C Difficile by pcr POSITIVE (A) NEGATIVE Final    Comment: Positive for toxigenic C. difficile with little to no toxin production. Only treat if clinical presentation suggests symptomatic illness. Performed at [facility], [place] [address], [place], [place] [address]          Radiology Studies: Ct Abdomen Pelvis W Contrast  Result Date: 02/05/2017 CLINICAL DATA:  Right-sided abdominal pain and diarrhea since 2 a.m. History of sigmoid colectomy 13 days ago. This was performed secondary to diverticulitis. EXAM: CT ABDOMEN AND PELVIS WITH CONTRAST TECHNIQUE: Multidetector CT imaging of the abdomen and pelvis was performed using the standard protocol following bolus administration of intravenous contrast. CONTRAST:  119mL ISOVUE-300 IOPAMIDOL (ISOVUE-300) INJECTION 61% COMPARISON:  11/28/2016 pelvic CT.  10/10/2016 abdominopelvic CT. FINDINGS: Lower chest: Clear lung bases.  Borderline cardiomegaly. Hepatobiliary: Hepatomegaly, 20 cm craniocaudal. Mild hepatic steatosis. Normal gallbladder, without biliary ductal dilatation. Pancreas: Normal, without mass or ductal dilatation. Spleen: Normal in size, without focal abnormality. Adrenals/Urinary Tract: Normal adrenal glands. Upper pole left renal too small to characterize lesion. Normal right kidney, without hydronephrosis. Normal urinary bladder. Stomach/Bowel: Normal stomach, without wall thickening. Interval surgical sutures at the rectosigmoid junction. There is mild edema surrounding the surgical site, including on image 70/series 2. Diverticula identified in the immediately proximal sigmoid. No extraluminal gas or pericolonic fluid collection. Diverticula throughout the remainder of the colon. Normal terminal ileum and appendix. Normal small bowel. Vascular/Lymphatic: Normal caliber of the aorta and branch vessels. No abdominopelvic adenopathy.  Reproductive: Hysterectomy.  No adnexal mass. Other: No significant free fluid. Edema within the femoral rectus musculature is likely postoperative. No abscess. Musculoskeletal: Osteitis pubis. Degenerate disc disease at the lumbosacral junction. IMPRESSION: 1. Interval surgical changes at the rectosigmoid junction. Surrounding edema is felt to be greater than typically seen 13 days postop. Given adjacent residual sigmoid diverticula, suspicious for recurrent diverticulitis. No abscess or other acute complication identified. 2. Hepatomegaly and hepatic steatosis. Electronically Signed   By: Abigail Miyamoto M.D.   On: 02/05/2017 20:48        Scheduled Meds: . atenolol  12.5 mg Oral Daily  . enoxaparin (LOVENOX) injection  40 mg Subcutaneous Q24H  . methimazole  2.5 mg Oral Daily  . mometasone-formoterol  2 puff Inhalation BID  . vancomycin  125 mg Oral QID   Continuous Infusions:   LOS: 1 day        Aline August, MD [facility] Pager [phone]  If 7PM-7AM, please contact night-coverage [email or web address] Password [email or web address] 02/06/2017, 1:24 PM

## 2017-02-06 NOTE — ED Notes (Signed)
Report given to RN at [facility] ([place])

## 2017-02-06 NOTE — Progress Notes (Signed)
02/06/17  1100 Per Dr. Marcello Moores patient incisions are good. No dressings or treatment for lap sites.

## 2017-02-06 NOTE — ED Notes (Signed)
Report received 

## 2017-02-06 NOTE — ED Notes (Signed)
[facility] here to transport pt to  [facility]

## 2017-02-07 LAB — CBC WITH DIFFERENTIAL/PLATELET
BASOS ABS: 0 10*3/uL (ref 0.0–0.1)
Basophils Relative: 0 %
Eosinophils Absolute: 0.5 10*3/uL (ref 0.0–0.7)
Eosinophils Relative: 7 %
HEMATOCRIT: 35 % — AB (ref 36.0–46.0)
Hemoglobin: 11.6 g/dL — ABNORMAL LOW (ref 12.0–15.0)
LYMPHS ABS: 2.5 10*3/uL (ref 0.7–4.0)
LYMPHS PCT: 34 %
MCH: 28.1 pg (ref 26.0–34.0)
MCHC: 33.1 g/dL (ref 30.0–36.0)
MCV: 84.7 fL (ref 78.0–100.0)
MONO ABS: 0.5 10*3/uL (ref 0.1–1.0)
MONOS PCT: 7 %
NEUTROS ABS: 3.7 10*3/uL (ref 1.7–7.7)
Neutrophils Relative %: 52 %
Platelets: 312 10*3/uL (ref 150–400)
RBC: 4.13 MIL/uL (ref 3.87–5.11)
RDW: 13.6 % (ref 11.5–15.5)
WBC: 7.2 10*3/uL (ref 4.0–10.5)

## 2017-02-07 LAB — BASIC METABOLIC PANEL
Anion gap: 6 (ref 5–15)
BUN: 9 mg/dL (ref 6–20)
CHLORIDE: 108 mmol/L (ref 101–111)
CO2: 30 mmol/L (ref 22–32)
Calcium: 9.1 mg/dL (ref 8.9–10.3)
Creatinine, Ser: 0.89 mg/dL (ref 0.44–1.00)
GFR calc Af Amer: >60 mL/min (ref >60)
GFR calc non Af Amer: >60 mL/min (ref >60)
Glucose, Bld: 104 mg/dL — ABNORMAL HIGH (ref 65–99)
Potassium: 3.8 mmol/L (ref 3.5–5.1)
Sodium: 144 mmol/L (ref 135–145)

## 2017-02-07 LAB — MAGNESIUM: Magnesium: 2 mg/dL (ref 1.7–2.4)

## 2017-02-07 MED ORDER — FLORANEX PO PACK
1.0000 g | PACK | Freq: Three times a day (TID) | ORAL | Status: DC
  Filled 2017-02-07 (×2): qty 1

## 2017-02-07 MED ORDER — LACTINEX PO CHEW
1.0000 | CHEWABLE_TABLET | Freq: Three times a day (TID) | ORAL | Status: DC
  Administered 2017-02-07 – 2017-02-08 (×3): 1 via ORAL
  Filled 2017-02-07 (×5): qty 1

## 2017-02-07 NOTE — Progress Notes (Signed)
Patient ID: Emily Osborne, female   DOB: 06/14/1961, 56 y.o.   MRN: [id number]  PROGRESS NOTE    Emily Osborne  TIR:[id number] DOB: Nov 02, 1960 DOA: 02/05/2017 PCP: [name], DO   Brief Narrative:  56 y.o.femalewith a past medical history significant for asthma, HTN, Graves disease on methimazole, as well as Cdiff and recurrent diverticulitis status post robotic sigmoidectomy with Dr. Marcello Moores on 01/23/17  who presents with diarrhea. CAT scan of the abdomen was discussed with radiology and the radiologist was in favor of C. difficile colitis rather than diverticulitis.  C. difficile antigen and PCR were positive but toxin was negative. She was started on oral vancomycin. Her diarrhea is improving  Assessment & Plan:   Principal Problem:   Colitis Active Problems:   Asthma   Essential hypertension   Graves disease   Diarrhea   1. 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 difficile colitis rather than diverticulitis. C. difficile antigen and PCR positive but toxin is negative. I discussed the case with Dr. Megan Salon from infectious diseases on 02/06/2017 and since the patient is having symptoms of diarrhea, patient will be treated with oral vancomycin 125 mg 4 times a day for at least 2 weeks as this is the first recurrence of C. Difficile.  - Diarrhea is improving but still significant.  - Spoke to Dr. Marcello Moores on phone on 02/06/17 and she recommended that patient keep her outpatient appointment with Dr. Marcello Moores next week and recommended outpatient ID followup.  2. Hyeprtension: -Continue atenolol. Monitor blood pressure  3. Graves disease: -Continue methimazole. Outpatient follow-up  4. Anxiety: -May have alprazolam PRN  5. Asthma: -Continue Advair. Stable  6. Recent sigmoidectomy: -Continue oxycodone, phenergan PRN. Outpatient follow-up with  general surgery  DVT prophylaxis: Lovenox Code Status:  Full Family Communication: Discussed with family including sisters present at bedside Disposition Plan: Home in 1-2 days  Consultants: Gen. surgery/Dr. Lowella Dandy to Dr. Megan Salon from infectious diseases on phone on 02/06/2017  Procedures: None  Antimicrobials: Oral vancomycin from 02/06/2017   Subjective: Patient seen and examined at bedside. She feels slightly better but is still having significant diarrhea and feels weak. No overnight fever.  Objective: Vitals:   02/06/17 1942 02/06/17 2102 02/07/17 0500 02/07/17 1000  BP:  104/67 106/64 109/77  Pulse:  67 63 61  Resp:  18 18 18   Temp:  98.4 F (36.9 C) 98.2 F (36.8 C) 98.4 F (36.9 C)  TempSrc:  Oral Oral   SpO2: 99% 98% 99% 97%  Weight:      Height:        Intake/Output Summary (Last 24 hours) at 02/07/17 1452 Last data filed at 02/07/17 1028  Gross per 24 hour  Intake              960 ml  Output                0 ml  Net              960 ml   Filed Weights   02/06/17 0443  Weight: 107.8 kg (237 lb 10.5 oz)    Examination:  General exam: Appears calm and comfortable  Respiratory system: Bilateral decreased breath sound at bases Cardiovascular system: S1 & S2 heard, RRR.   Gastrointestinal system: Abdomen is nondistended, soft and nontender. Normal bowel sounds heard. Extremities: No cyanosis, clubbing, edema  Data Reviewed: I have personally reviewed following labs and imaging studies  CBC:  Recent Labs Lab 02/05/17 1812 02/06/17 0934 02/07/17 0511  WBC 8.4 7.9 7.2  NEUTROABS 4.5 4.7 3.7  HGB 12.1 11.7* 11.6*  HCT 35.3* 35.2* 35.0*  MCV 85.7 86.1 84.7  PLT 335 313 188   Basic Metabolic Panel:  Recent Labs Lab 02/05/17 1812 02/07/17 0511  NA 140 144  K 3.7 3.8  CL 109 108  CO2 24 30  GLUCOSE 101* 104*  BUN 11 9  CREATININE 0.87 0.89  CALCIUM 8.9 9.1  MG  --  2.0   GFR: Estimated Creatinine Clearance: 85.1 mL/min  (by C-G formula based on SCr of 0.89 mg/dL). Liver Function Tests:  Recent Labs Lab 02/05/17 1812  AST 23  ALT 21  ALKPHOS 84  BILITOT 0.3  PROT 7.2  ALBUMIN 3.5   No results for input(s): LIPASE, AMYLASE in the last 168 hours. No results for input(s): AMMONIA in the last 168 hours. Coagulation Profile: No results for input(s): INR, PROTIME in the last 168 hours. Cardiac Enzymes: No results for input(s): CKTOTAL, CKMB, CKMBINDEX, TROPONINI in the last 168 hours. BNP (last 3 results) No results for input(s): PROBNP in the last 8760 hours. HbA1C: No results for input(s): HGBA1C in the last 72 hours. CBG: No results for input(s): GLUCAP in the last 168 hours. Lipid Profile: No results for input(s): CHOL, HDL, LDLCALC, TRIG, CHOLHDL, LDLDIRECT in the last 72 hours. Thyroid Function Tests: No results for input(s): TSH, T4TOTAL, FREET4, T3FREE, THYROIDAB in the last 72 hours. Anemia Panel: No results for input(s): VITAMINB12, FOLATE, FERRITIN, TIBC, IRON, RETICCTPCT in the last 72 hours. Sepsis Labs: No results for input(s): PROCALCITON, LATICACIDVEN in the last 168 hours.  Recent Results (from the past 240 hour(s))  C difficile quick scan w PCR reflex     Status: Abnormal   Collection Time: 02/05/17  5:10 PM  Result Value Ref Range Status   C Diff antigen POSITIVE (A) NEGATIVE Final   C Diff toxin NEGATIVE NEGATIVE Final   C Diff interpretation Results are indeterminate. See PCR results.  Final    Comment: PERFORMED AT [facility] Performed at [facility], [place] [address] [place], [place] [address]   Clostridium Difficile by PCR     Status: Abnormal   Collection Time: 02/05/17  5:10 PM  Result Value Ref Range Status   Toxigenic C Difficile by pcr POSITIVE (A) NEGATIVE Final    Comment: Positive for toxigenic C. difficile with little to no toxin production. Only treat if clinical presentation suggests symptomatic illness. Performed at [facility],  [place] [address] [place], [place] [address]          Radiology Studies: Ct Abdomen Pelvis W Contrast  Result Date: 02/05/2017 CLINICAL DATA:  Right-sided abdominal pain and diarrhea since 2 a.m. History of sigmoid colectomy 13 days ago. This was performed secondary to diverticulitis. EXAM: CT ABDOMEN AND PELVIS WITH CONTRAST TECHNIQUE: Multidetector CT imaging of the abdomen and pelvis was performed using the standard protocol following bolus administration of intravenous contrast. CONTRAST:  141mL ISOVUE-300 IOPAMIDOL (ISOVUE-300) INJECTION 61% COMPARISON:  11/28/2016 pelvic CT.  10/10/2016 abdominopelvic CT. FINDINGS: Lower chest: Clear lung bases.  Borderline cardiomegaly. Hepatobiliary: Hepatomegaly, 20 cm craniocaudal. Mild hepatic steatosis. Normal gallbladder, without biliary ductal dilatation. Pancreas: Normal, without mass or ductal dilatation. Spleen: Normal in size, without focal abnormality. Adrenals/Urinary Tract: Normal adrenal glands. Upper pole left renal too small to  characterize lesion. Normal right kidney, without hydronephrosis. Normal urinary bladder. Stomach/Bowel: Normal stomach, without wall thickening. Interval surgical sutures at the rectosigmoid junction. There is mild edema surrounding the surgical site, including on image 70/series 2. Diverticula identified in the immediately proximal sigmoid. No extraluminal gas or pericolonic fluid collection. Diverticula throughout the remainder of the colon. Normal terminal ileum and appendix. Normal small bowel. Vascular/Lymphatic: Normal caliber of the aorta and branch vessels. No abdominopelvic adenopathy. Reproductive: Hysterectomy.  No adnexal mass. Other: No significant free fluid. Edema within the femoral rectus musculature is likely postoperative. No abscess. Musculoskeletal: Osteitis pubis. Degenerate disc disease at the lumbosacral junction. IMPRESSION: 1. Interval surgical changes at the rectosigmoid junction. Surrounding edema is  felt to be greater than typically seen 13 days postop. Given adjacent residual sigmoid diverticula, suspicious for recurrent diverticulitis. No abscess or other acute complication identified. 2. Hepatomegaly and hepatic steatosis. Electronically Signed   By: Abigail Miyamoto M.D.   On: 02/05/2017 20:48        Scheduled Meds: . atenolol  12.5 mg Oral Daily  . enoxaparin (LOVENOX) injection  40 mg Subcutaneous Q24H  . lactobacillus acidophilus & bulgar  1 tablet Oral TID WC  . methimazole  2.5 mg Oral Daily  . mometasone-formoterol  2 puff Inhalation BID  . vancomycin  125 mg Oral QID   Continuous Infusions:   LOS: 2 days        Aline August, MD [facility] Pager [phone]  If 7PM-7AM, please contact night-coverage [email or web address] Password [email or web address] 02/07/2017, 2:52 PM

## 2017-02-08 LAB — CBC WITH DIFFERENTIAL/PLATELET
BASOS ABS: 0 10*3/uL (ref 0.0–0.1)
BASOS PCT: 1 %
EOS ABS: 0.5 10*3/uL (ref 0.0–0.7)
EOS PCT: 8 %
HCT: 34.9 % — ABNORMAL LOW (ref 36.0–46.0)
HEMOGLOBIN: 11.8 g/dL — AB (ref 12.0–15.0)
LYMPHS ABS: 2.3 10*3/uL (ref 0.7–4.0)
Lymphocytes Relative: 38 %
MCH: 28.9 pg (ref 26.0–34.0)
MCHC: 33.8 g/dL (ref 30.0–36.0)
MCV: 85.5 fL (ref 78.0–100.0)
Monocytes Absolute: 0.3 10*3/uL (ref 0.1–1.0)
Monocytes Relative: 6 %
NEUTROS PCT: 47 %
Neutro Abs: 2.8 10*3/uL (ref 1.7–7.7)
PLATELETS: 301 10*3/uL (ref 150–400)
RBC: 4.08 MIL/uL (ref 3.87–5.11)
RDW: 13.5 % (ref 11.5–15.5)
WBC: 6 10*3/uL (ref 4.0–10.5)

## 2017-02-08 LAB — BASIC METABOLIC PANEL
ANION GAP: 7 (ref 5–15)
BUN: 10 mg/dL (ref 6–20)
CHLORIDE: 108 mmol/L (ref 101–111)
CO2: 24 mmol/L (ref 22–32)
Calcium: 8.7 mg/dL — ABNORMAL LOW (ref 8.9–10.3)
Creatinine, Ser: 0.82 mg/dL (ref 0.44–1.00)
Glucose, Bld: 124 mg/dL — ABNORMAL HIGH (ref 65–99)
POTASSIUM: 3.7 mmol/L (ref 3.5–5.1)
SODIUM: 139 mmol/L (ref 135–145)

## 2017-02-08 LAB — MAGNESIUM: MAGNESIUM: 1.8 mg/dL (ref 1.7–2.4)

## 2017-02-08 MED ORDER — ATENOLOL 25 MG PO TABS: 12.5000 mg | ORAL_TABLET | Freq: Every day | ORAL | 0 refills | Status: DC

## 2017-02-08 MED ORDER — DIPHENHYDRAMINE HCL 25 MG PO CAPS
25.0000 mg | ORAL_CAPSULE | ORAL | Status: DC
  Administered 2017-02-08 (×2): 25 mg via ORAL
  Filled 2017-02-08 (×2): qty 1

## 2017-02-08 MED ORDER — VANCOMYCIN 50 MG/ML ORAL SOLUTION: 125.0000 mg | Freq: Four times a day (QID) | ORAL | 0 refills | Status: AC

## 2017-02-08 NOTE — Discharge Summary (Signed)
Physician Discharge Summary  Emily Osborne EXH:[id number] DOB: 07/15/1961 DOA: 02/05/2017  PCP: [name], DO  Admit date: 02/05/2017 Discharge date: 02/08/2017  Admitted From: Home Disposition:  Home  Recommendations for Outpatient Follow-up:  1. Follow up with PCP in 1-2 weeks 2. Please follow up with Dr. Leighton Ruff as scheduled   Home Health: No Equipment/Devices: None  Discharge Condition: Stable CODE STATUS: Full  Diet recommendation: Heart Healthy   Brief/Interim Summary: 56 y.o.femalewith a past medical history significant for asthma, Graves disease on methimazole and atenolol for palpitations and not hypertension as per the patient, as well as Cdiff and recurrent diverticulitisstatus post robotic sigmoidectomy with Dr. Marcello Moores on 01/23/17 who presents with diarrhea. CAT scan of the abdomen was discussed with radiology and the radiologist was in favor of C. difficile colitis rather than diverticulitis.  C. difficile antigen and PCR were positive but toxin was negative. She was started on oral vancomycin. Her diarrhea is improving   Discharge Diagnoses:  Principal Problem:   Colitis Active Problems:   Asthma   Graves disease   Diarrhea  1. 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 difficile colitis rather than diverticulitis. C. difficile antigen and PCR positive but toxin is negative. I discussed the case with Dr. Megan Salon from infectious diseases on 02/06/2017 and since the patient is having symptoms of diarrhea, patient will be treated with oral vancomycin 125 mg 4 times a day for at least 2 weeks as this is the first recurrence of C. Difficile.  - Diarrhea has improved. Discharge the patient home today on oral vancomycin to finish 2 weeks course of therapy. - Spoke to Dr. Marcello Moores on phone on 02/06/17 and she recommended that patient keep  her outpatient appointment with Dr. Marcello Moores next week and recommended outpatient ID followup.  2. Graves disease: -Continue methimazole. Outpatient follow-up. Patient is to continue atenolol which she uses for palpitations and not for hypertension.  3. Anxiety: -May have alprazolam PRN  4. Asthma: -Continue Advair. Stable  5. Recent sigmoidectomy: -Continue oxycodone, phenergan PRN. Outpatient follow-up with general surgery   Discharge Instructions  Discharge Instructions    Call MD for:  difficulty breathing, headache or visual disturbances    Complete by:  As directed    Call MD for:  hives    Complete by:  As directed    Call MD for:  persistant dizziness or light-headedness    Complete by:  As directed    Call MD for:  persistant nausea and vomiting    Complete by:  As directed    Call MD for:  severe uncontrolled pain    Complete by:  As directed    Call MD for:  temperature >100.4    Complete by:  As directed    Diet - low sodium heart healthy    Complete by:  As directed    Increase activity slowly    Complete by:  As directed      Allergies as of 02/08/2017      Reactions   Aspirin Other (See Comments)   REACTION: anaphylaxis   Gentamicin Other (See Comments)   Eye drops turned the sclera bright red   Ibuprofen Other (See Comments)   REACTION: anaphylaxsis   Nsaids Other (See Comments)   REACTION: anaphylaxis   Doxycycline Other (See Comments)   REACTION: severe nausea/vomiting   Influenza A (h1n1) Monoval  Vac Other (See Comments)   REACTION: sick for 3 weeks   Loratadine Other (See Comments)   Fatigue/weakness   Metronidazole Other (See Comments)   REACTION: red face/swelling   Periactin [cyproheptadine] Other (See Comments)   paranoid   Ranitidine Hcl Other (See Comments)   REACTION: Lips turned red /peel   Sulfamethoxazole-trimethoprim Other (See Comments)   REACTION: face red/peel   Tramadol Hcl Other (See Comments)   REACTION: paranoid       Medication List    STOP taking these medications   DEXILANT 60 MG capsule Generic drug:  dexlansoprazole     TAKE these medications   albuterol 108 (90 Base) MCG/ACT inhaler Commonly known as:  PROAIR HFA USE 2 PUFFS EVERY 4 HOURS AS NEEDED FOR COUGH, WHEEZE OR SHORTNESS OF BREATH. What changed:  how much to take  how to take this  when to take this  reasons to take this  additional instructions   albuterol (2.5 MG/3ML) 0.083% nebulizer solution Commonly known as:  PROVENTIL Take 3 mLs (2.5 mg total) by nebulization every 6 (six) hours as needed for wheezing or shortness of breath. What changed:  Another medication with the same name was changed. Make sure you understand how and when to take each.   ALPRAZolam 0.25 MG tablet Commonly known as:  XANAX TAKE 1 TABLET BY MOUTH 3 TIMES A DAY AS NEEDED What changed:  See the new instructions.   atenolol 25 MG tablet Commonly known as:  TENORMIN Take 0.5 tablets (12.5 mg total) by mouth daily. Takes 0.5 tablet What changed:  how much to take  additional instructions   CLINPRO 5000 1.1 % Pste Generic drug:  Sodium Fluoride Take 1 application by mouth See admin instructions. Brush teeth nightly   fexofenadine 180 MG tablet Commonly known as:  ALLEGRA Take 1 tablet (180 mg total) by mouth daily.   fluticasone 50 MCG/ACT nasal spray Commonly known as:  FLONASE PLACE 2 SPRAYS INTO BOTH NOSTRILS DAILY. What changed:  See the new instructions.   Fluticasone-Salmeterol 250-50 MCG/DOSE Aepb Commonly known as:  ADVAIR DISKUS Inhale 1 puff into the lungs 2 (two) times daily. What changed:  when to take this   FREESTYLE FREEDOM Kit 1 Device by Does not apply route daily.   FREESTYLE LITE test strip Generic drug:  glucose blood USE TO CHECK BLOOD SUGAR ONCE A DAY   I.L.X. B-12 Elix Take 1 each by mouth daily as needed (energy). 1 droplet full   lidocaine 5 % Commonly known as:  LIDODERM APPLY 1 PATCH ON THE  SKIN EVERY 12 HRS REMOVE, DISCARD PATCH WITHIN 12 HRS OR AS DIRECTED What changed:  See the new instructions.   methimazole 5 MG tablet Commonly known as:  TAPAZOLE Take 0.5 tablets (2.5 mg total) by mouth daily. What changed:  additional instructions   oxyCODONE 5 MG immediate release tablet Commonly known as:  Oxy IR/ROXICODONE Take 1-2 tablets (5-10 mg total) by mouth every 4 (four) hours as needed for moderate pain, severe pain or breakthrough pain.   promethazine 25 MG tablet Commonly known as:  PHENERGAN Take 1 tablet (25 mg total) by mouth 4 (four) times daily as needed for nausea or vomiting.   triamcinolone cream 0.1 % Commonly known as:  KENALOG APPLY TO AFFECTED AREA TWICE A DAY What changed:  See the new instructions.   TYLENOL 500 MG tablet Generic drug:  acetaminophen Take 1,000 mg by mouth every 6 (six) hours as needed for moderate  pain.   valACYclovir 1000 MG tablet Commonly known as:  VALTREX Take 1 tablet (1,000 mg total) by mouth 3 (three) times daily. What changed:  when to take this  additional instructions   vancomycin 50 mg/mL oral solution Commonly known as:  VANCOCIN Take 2.5 mLs (125 mg total) by mouth 4 (four) times daily.   VOLTAREN 1 % Gel Generic drug:  diclofenac sodium Apply 2 g topically 2 (two) times daily as needed (pain). For pain      Follow-up Information    [name], DO Follow up in 1 week(s).   Specialty:  Family Medicine Contact information: [address] [place] [address] [place] [address] [phone]        Leighton Ruff, MD Follow up.   Specialty:  General Surgery Why:  keep scheduled appointment Contact information: [address] [place] [address] [phone]          Allergies  Allergen Reactions  . Aspirin Other (See Comments)    REACTION: anaphylaxis  . Gentamicin Other (See Comments)    Eye drops turned the sclera bright red  . Ibuprofen Other (See Comments)     REACTION: anaphylaxsis  . Nsaids Other (See Comments)    REACTION: anaphylaxis  . Doxycycline Other (See Comments)    REACTION: severe nausea/vomiting  . Influenza A (H1n1) Monoval Vac Other (See Comments)    REACTION: sick for 3 weeks  . Loratadine Other (See Comments)    Fatigue/weakness  . Metronidazole Other (See Comments)    REACTION: red face/swelling  . Periactin [Cyproheptadine] Other (See Comments)    paranoid   . Ranitidine Hcl Other (See Comments)    REACTION: Lips turned red /peel  . Sulfamethoxazole-Trimethoprim Other (See Comments)    REACTION: face red/peel  . Tramadol Hcl Other (See Comments)    REACTION: paranoid    Consultations:  General surgery/Dr. Leighton Ruff  Spoke to Dr. Megan Salon from infectious diseases on 02/06/2017 on phone   Procedures/Studies: Ct Abdomen Pelvis W Contrast  Result Date: 02/05/2017 CLINICAL DATA:  Right-sided abdominal pain and diarrhea since 2 a.m. History of sigmoid colectomy 13 days ago. This was performed secondary to diverticulitis. EXAM: CT ABDOMEN AND PELVIS WITH CONTRAST TECHNIQUE: Multidetector CT imaging of the abdomen and pelvis was performed using the standard protocol following bolus administration of intravenous contrast. CONTRAST:  127m ISOVUE-300 IOPAMIDOL (ISOVUE-300) INJECTION 61% COMPARISON:  11/28/2016 pelvic CT.  10/10/2016 abdominopelvic CT. FINDINGS: Lower chest: Clear lung bases.  Borderline cardiomegaly. Hepatobiliary: Hepatomegaly, 20 cm craniocaudal. Mild hepatic steatosis. Normal gallbladder, without biliary ductal dilatation. Pancreas: Normal, without mass or ductal dilatation. Spleen: Normal in size, without focal abnormality. Adrenals/Urinary Tract: Normal adrenal glands. Upper pole left renal too small to characterize lesion. Normal right kidney, without hydronephrosis. Normal urinary bladder. Stomach/Bowel: Normal stomach, without wall thickening. Interval surgical sutures at the rectosigmoid junction.  There is mild edema surrounding the surgical site, including on image 70/series 2. Diverticula identified in the immediately proximal sigmoid. No extraluminal gas or pericolonic fluid collection. Diverticula throughout the remainder of the colon. Normal terminal ileum and appendix. Normal small bowel. Vascular/Lymphatic: Normal caliber of the aorta and branch vessels. No abdominopelvic adenopathy. Reproductive: Hysterectomy.  No adnexal mass. Other: No significant free fluid. Edema within the femoral rectus musculature is likely postoperative. No abscess. Musculoskeletal: Osteitis pubis. Degenerate disc disease at the lumbosacral junction. IMPRESSION: 1. Interval surgical changes at the rectosigmoid junction. Surrounding edema is felt to be greater than  typically seen 13 days postop. Given adjacent residual sigmoid diverticula, suspicious for recurrent diverticulitis. No abscess or other acute complication identified. 2. Hepatomegaly and hepatic steatosis. Electronically Signed   By: Abigail Miyamoto M.D.   On: 02/05/2017 20:48       Subjective: Patient seen and examined at bedside. She feels much better. She denies any fever, nausea, vomiting. Her diarrhea has much improved.  Discharge Exam: Vitals:   02/08/17 0655 02/08/17 1017  BP: 111/70 127/80  Pulse: 68 72  Resp: 18 18  Temp: 98.1 F (36.7 C) 98.4 F (36.9 C)   Vitals:   02/07/17 2210 02/08/17 0655 02/08/17 1017 02/08/17 1052  BP: 122/78 111/70 127/80   Pulse: 70 68 72   Resp: 18 18 18    Temp: 98.2 F (36.8 C) 98.1 F (36.7 C) 98.4 F (36.9 C)   TempSrc: Oral Oral    SpO2: 98% 100% 100% 100%  Weight:      Height:        General: Pt is alert, awake, not in acute distress Cardiovascular: RRR, S1/S2 + Respiratory: Bilateral decreased breath sounds at bases Abdominal: Soft, NT, ND, bowel sounds + Extremities: no edema, no cyanosis    The results of significant diagnostics from this hospitalization (including imaging,  microbiology, ancillary and laboratory) are listed below for reference.     Microbiology: Recent Results (from the past 240 hour(s))  C difficile quick scan w PCR reflex     Status: Abnormal   Collection Time: 02/05/17  5:10 PM  Result Value Ref Range Status   C Diff antigen POSITIVE (A) NEGATIVE Final   C Diff toxin NEGATIVE NEGATIVE Final   C Diff interpretation Results are indeterminate. See PCR results.  Final    Comment: PERFORMED AT [facility] Performed at [facility], [place] [address] [place], [place] [address]   Clostridium Difficile by PCR     Status: Abnormal   Collection Time: 02/05/17  5:10 PM  Result Value Ref Range Status   Toxigenic C Difficile by pcr POSITIVE (A) NEGATIVE Final    Comment: Positive for toxigenic C. difficile with little to no toxin production. Only treat if clinical presentation suggests symptomatic illness. Performed at [facility], [place] [address] [place], [place] [address]      Labs: BNP (last 3 results) No results for input(s): BNP in the last 8760 hours. Basic Metabolic Panel:  Recent Labs Lab 02/05/17 1812 02/07/17 0511 02/08/17 0909  NA 140 144 139  K 3.7 3.8 3.7  CL 109 108 108  CO2 24 30 24   GLUCOSE 101* 104* 124*  BUN 11 9 10   CREATININE 0.87 0.89 0.82  CALCIUM 8.9 9.1 8.7*  MG  --  2.0 1.8   Liver Function Tests:  Recent Labs Lab 02/05/17 1812  AST 23  ALT 21  ALKPHOS 84  BILITOT 0.3  PROT 7.2  ALBUMIN 3.5   No results for input(s): LIPASE, AMYLASE in the last 168 hours. No results for input(s): AMMONIA in the last 168 hours. CBC:  Recent Labs Lab 02/05/17 1812 02/06/17 0934 02/07/17 0511 02/08/17 0909  WBC 8.4 7.9 7.2 6.0  NEUTROABS 4.5 4.7 3.7 2.8  HGB 12.1 11.7* 11.6* 11.8*  HCT 35.3* 35.2* 35.0* 34.9*  MCV 85.7 86.1 84.7 85.5  PLT 335 313 312 301   Cardiac Enzymes: No results for input(s): CKTOTAL, CKMB, CKMBINDEX, TROPONINI in the last 168 hours. BNP: Invalid input(s):  POCBNP CBG: No results for  input(s): GLUCAP in the last 168 hours. D-Dimer No results for input(s): DDIMER in the last 72 hours. Hgb A1c No results for input(s): HGBA1C in the last 72 hours. Lipid Profile No results for input(s): CHOL, HDL, LDLCALC, TRIG, CHOLHDL, LDLDIRECT in the last 72 hours. Thyroid function studies No results for input(s): TSH, T4TOTAL, T3FREE, THYROIDAB in the last 72 hours.  Invalid input(s): FREET3 Anemia work up No results for input(s): VITAMINB12, FOLATE, FERRITIN, TIBC, IRON, RETICCTPCT in the last 72 hours. Urinalysis    Component Value Date/Time   COLORURINE YELLOW 10/10/2016 1310   APPEARANCEUR CLEAR 10/10/2016 1310   LABSPEC 1.005 10/10/2016 1310   PHURINE 7.0 10/10/2016 1310   GLUCOSEU NEGATIVE 10/10/2016 1310   HGBUR NEGATIVE 10/10/2016 1310   HGBUR negative 09/07/2010 0838   BILIRUBINUR NEGATIVE 10/10/2016 1310   BILIRUBINUR neg 09/28/2016 0930   KETONESUR NEGATIVE 10/10/2016 1310   PROTEINUR NEGATIVE 10/10/2016 1310   UROBILINOGEN negative 09/28/2016 0930   UROBILINOGEN negative 09/07/2010 0838   NITRITE NEGATIVE 10/10/2016 1310   LEUKOCYTESUR NEGATIVE 10/10/2016 1310   Sepsis Labs Invalid input(s): PROCALCITONIN,  WBC,  LACTICIDVEN Microbiology Recent Results (from the past 240 hour(s))  C difficile quick scan w PCR reflex     Status: Abnormal   Collection Time: 02/05/17  5:10 PM  Result Value Ref Range Status   C Diff antigen POSITIVE (A) NEGATIVE Final   C Diff toxin NEGATIVE NEGATIVE Final   C Diff interpretation Results are indeterminate. See PCR results.  Final    Comment: PERFORMED AT [facility] Performed at [facility], [place] [address], [place], [place] [address]   Clostridium Difficile by PCR     Status: Abnormal   Collection Time: 02/05/17  5:10 PM  Result Value Ref Range Status   Toxigenic C Difficile by pcr POSITIVE (A) NEGATIVE Final    Comment: Positive for toxigenic C. difficile with little to no  toxin production. Only treat if clinical presentation suggests symptomatic illness. Performed at [facility], [place] [address] [place],  [address]      Time coordinating discharge: 35 minutes  SIGNED:   Aline August, MD  [facility] 02/08/2017, 11:30 AM Pager: [phone]  If 7PM-7AM, please contact night-coverage [email or web address] Password [email or web address]

## 2017-02-08 NOTE — Progress Notes (Signed)
Discharge instructions discussed until no further questions ask. IV discontinued. Am assessment unchanged. No stools on my shift

## 2017-02-11 ENCOUNTER — Telehealth: Payer: Self-pay | Admitting: Behavioral Health

## 2017-02-11 NOTE — Telephone Encounter (Signed)
Hospital Follow-up scheduled for 02/25/17 at 11:30 AM with PCP.

## 2017-02-11 NOTE — Telephone Encounter (Signed)
Attempted to reach patient for hospital follow-up call. Left message for patient to return call when available.

## 2017-02-19 ENCOUNTER — Ambulatory Visit: Payer: BC Managed Care – PPO | Admitting: Infectious Disease

## 2017-02-20 ENCOUNTER — Other Ambulatory Visit: Payer: Self-pay | Admitting: Endocrinology

## 2017-02-25 ENCOUNTER — Encounter: Payer: Self-pay | Admitting: Family Medicine

## 2017-02-25 ENCOUNTER — Ambulatory Visit (INDEPENDENT_AMBULATORY_CARE_PROVIDER_SITE_OTHER): Payer: BC Managed Care – PPO | Admitting: Family Medicine

## 2017-02-25 VITALS — BP 108/78 | HR 63 | Temp 98.1°F | Resp 16 | Ht 65.0 in | Wt 232.4 lb

## 2017-02-25 DIAGNOSIS — A0472 Enterocolitis due to Clostridium difficile, not specified as recurrent: Secondary | ICD-10-CM | POA: Diagnosis not present

## 2017-02-25 DIAGNOSIS — R1013 Epigastric pain: Secondary | ICD-10-CM | POA: Diagnosis not present

## 2017-02-25 NOTE — Assessment & Plan Note (Signed)
Off abx Will see surgeon next week Pt cancelled her ID app because she did not feel it was necessary-- she will discuss this with surgeon

## 2017-02-25 NOTE — Progress Notes (Signed)
Patient ID: Emily Osborne, female   DOB: 1961/06/30, 56 y.o.   MRN: [id number]     Subjective:  I acted as a Education administrator for Dr. Carollee Herter.  Guerry Bruin, [place]   Patient ID: Emily Osborne, female    DOB: Jan 14, 1961, 56 y.o.   MRN: [id number]  Chief Complaint  Patient presents with  . Hospitalization Follow-up    HPI  Patient is in today for hospital follow up from 02/05/17 thru 02/08/17.  She was treated for diarrhea and c-diff.  Pt has f/u with surgeon.  She needs f/u with GI--- it was felt that the dexilant  may be the cause. She is now off of it.    Patient Care Team: Carollee Herter, Alferd Apa, DO as PCP - General Philemon Kingdom, MD as Consulting Physician (Internal Medicine) Armbruster, Renelda Loma, MD as Consulting Physician (Gastroenterology) Leighton Ruff, MD as Consulting Physician (General Surgery)   Past Medical History:  Diagnosis Date  . Allergy   . Anxiety   . Asthma    pt has inhaler  . C. difficile colitis   . Cataract    bil eyes  . Clostridium difficile colitis 12/13/2016  . Diverticulitis   . Dysrhythmia    palpitations  . Edema   . Elevated blood pressure reading without diagnosis of hypertension    "just elevated when I'm in pain" (12/07/2015)  . Fatty liver   . Fibromyalgia   . Ganglion of joint    right wrist  . GERD (gastroesophageal reflux disease)   . H. pylori infection   . Headache    hx of  . Helicobacter pylori (H. pylori) infection 12/13/2016  . Herpes zoster without mention of complication   . Hyperlipidemia   . Hyperthyroidism   . IBS (irritable bowel syndrome)   . Nontraumatic rupture of Achilles tendon   . Other acute reactions to stress   . Pain in joint, shoulder region   . Pain in joint, upper arm   . Pain in limb   . Personal history of other diseases of digestive system   . Pneumonia    once  . PONV (postoperative nausea and vomiting)   . Routine screening for STI (sexually transmitted infection) 12/13/2016  . Thyroid disease    hyperthyroidism    Past Surgical History:  Procedure Laterality Date  . ABDOMINAL HYSTERECTOMY    . ACHILLES TENDON REPAIR Right 2005  . BUNIONECTOMY Bilateral    Bunionectomy 1983  . COLON SURGERY    . GANGLION CYST EXCISION Right    wrist  . LEFT HEART CATH AND CORONARY ANGIOGRAPHY N/A 10/03/2016   Procedure: Left Heart Cath and Coronary Angiography;  Surgeon: Burnell Blanks, MD;  Location: [facility];  Service: Cardiovascular;  Laterality: N/A;  . MENISCUS REPAIR Right 04/2014  . TUBAL LIGATION    . wisdomteeth extraction      Family History  Problem Relation Age of Onset  . Hypertension Father   . Diabetes Father   . Heart disease Father   . Kidney disease Father        Died, 27  . Leukemia Brother   . Cancer Brother        myleoblastic anemia  . Hyperlipidemia Mother   . Hypertension Mother        Died, 37  . Thyroid disease Mother        Thyroid surgery  . Pernicious anemia Sister   . Thyroid disease Sister  On thyroid Rx  . Lupus Daughter   . Coronary artery disease Brother   . Hypertension Brother   . Colon cancer Neg Hx   . Esophageal cancer Neg Hx   . Rectal cancer Neg Hx   . Stomach cancer Neg Hx   . Pancreatic cancer Neg Hx     Social History   Social History  . Marital status: Divorced    Spouse name: N/A  . Number of children: 2  . Years of education: N/A   Occupational History  . Nurse/teacher    Social History Main Topics  . Smoking status: Never Smoker  . Smokeless tobacco: Never Used  . Alcohol use No  . Drug use: No  . Sexual activity: Not Currently    Partners: Male   Other Topics Concern  . Not on file   Social History Narrative   Lives with alone.  She has two daughter.   She works as a Programme researcher, broadcasting/film/video at [employer]--- no    Outpatient Medications Prior to Visit  Medication Sig Dispense Refill  . acetaminophen (TYLENOL) 500 MG tablet Take 1,000 mg by mouth every 6 (six) hours as needed for  moderate pain.     Marland Kitchen albuterol (PROAIR HFA) 108 (90 Base) MCG/ACT inhaler USE 2 PUFFS EVERY 4 HOURS AS NEEDED FOR COUGH, WHEEZE OR SHORTNESS OF BREATH. (Patient taking differently: Inhale 2 puffs into the lungs every 4 (four) hours as needed for wheezing or shortness of breath. ) 8.5 each 2  . albuterol (PROVENTIL) (2.5 MG/3ML) 0.083% nebulizer solution Take 3 mLs (2.5 mg total) by nebulization every 6 (six) hours as needed for wheezing or shortness of breath. 75 mL 3  . ALPRAZolam (XANAX) 0.25 MG tablet TAKE 1 TABLET BY MOUTH 3 TIMES A DAY AS NEEDED (Patient taking differently: TAKE 1 TABLET BY MOUTH 3 TIMES A DAY AS NEEDED for anxiety) 30 tablet 2  . atenolol (TENORMIN) 25 MG tablet Take 0.5 tablets (12.5 mg total) by mouth daily. Takes 0.5 tablet 30 tablet 0  . Blood Glucose Monitoring Suppl (FREESTYLE FREEDOM) KIT 1 Device by Does not apply route daily. 1 each 0  . CLINPRO 5000 1.1 % PSTE Take 1 application by mouth See admin instructions. Brush teeth nightly  4  . diclofenac sodium (VOLTAREN) 1 % GEL Apply 2 g topically 2 (two) times daily as needed (pain). For pain    . fexofenadine (ALLEGRA) 180 MG tablet Take 1 tablet (180 mg total) by mouth daily. 90 tablet 3  . fluticasone (FLONASE) 50 MCG/ACT nasal spray PLACE 2 SPRAYS INTO BOTH NOSTRILS DAILY. (Patient taking differently: PLACE 1 SPRAYS INTO BOTH NOSTRILS DAILY.) 16 g 4  . Fluticasone-Salmeterol (ADVAIR DISKUS) 250-50 MCG/DOSE AEPB Inhale 1 puff into the lungs 2 (two) times daily. (Patient taking differently: Inhale 1 puff into the lungs daily. ) 60 each 2  . FREESTYLE LITE test strip USE TO CHECK BLOOD SUGAR ONCE A DAY 100 each 6  . Iron Combinations (I.L.X. B-12) ELIX Take 1 each by mouth daily as needed (energy). 1 droplet full    . lidocaine (LIDODERM) 5 % APPLY 1 PATCH ON THE SKIN EVERY 12 HRS REMOVE, DISCARD PATCH WITHIN 12 HRS OR AS DIRECTED (Patient taking differently: APPLY 1 PATCH ON THE SKIN EVERY 12 HRS REMOVE, DISCARD PATCH  WITHIN 12 HRS AS NEEDED FOR PAIN) 30 patch 0  . methimazole (TAPAZOLE) 5 MG tablet Take 0.5 tablets (2.5 mg total) by mouth daily. (Patient taking  differently: Take 2.5 mg by mouth daily. Takes 0.5 tablet) 90 tablet 0  . oxyCODONE (OXY IR/ROXICODONE) 5 MG immediate release tablet Take 1-2 tablets (5-10 mg total) by mouth every 4 (four) hours as needed for moderate pain, severe pain or breakthrough pain. 30 tablet 0  . promethazine (PHENERGAN) 25 MG tablet Take 1 tablet (25 mg total) by mouth 4 (four) times daily as needed for nausea or vomiting. 30 tablet 0  . triamcinolone cream (KENALOG) 0.1 % APPLY TO AFFECTED AREA TWICE A DAY (Patient taking differently: APPLY TO AFFECTED AREA TWICE A DAY AS NEEDED FOR RASH) 30 g 0  . valACYclovir (VALTREX) 1000 MG tablet Take 1 tablet (1,000 mg total) by mouth 3 (three) times daily. (Patient taking differently: Take 1,000 mg by mouth See admin instructions. Take 2g twice a day at first sign of fever blister, then take 1g twice daily until symptoms are gone) 30 tablet 5  . methimazole (TAPAZOLE) 5 MG tablet TAKE 1 TABLET (5 MG TOTAL) BY MOUTH DAILY. 90 tablet 1   No facility-administered medications prior to visit.     Allergies  Allergen Reactions  . Aspirin Other (See Comments)    REACTION: anaphylaxis  . Gentamicin Other (See Comments)    Eye drops turned the sclera bright red  . Ibuprofen Other (See Comments)    REACTION: anaphylaxsis  . Nsaids Other (See Comments)    REACTION: anaphylaxis  . Doxycycline Other (See Comments)    REACTION: severe nausea/vomiting  . Influenza A (H1n1) Mon[place] Vac Other (See Comments)    REACTION: sick for 3 weeks  . Loratadine Other (See Comments)    Fatigue/weakness  . Metronidazole Other (See Comments)    REACTION: red face/swelling  . Periactin [Cyproheptadine] Other (See Comments)    paranoid   . Ranitidine Hcl Other (See Comments)    REACTION: Lips turned red /peel  . Sulfamethoxazole-Trimethoprim Other  (See Comments)    REACTION: face red/peel  . Tramadol Hcl Other (See Comments)    REACTION: paranoid    Review of Systems  Constitutional: Negative for fever and malaise/fatigue.  HENT: Negative for congestion.   Eyes: Negative for blurred vision.  Respiratory: Negative for cough and shortness of breath.   Cardiovascular: Negative for chest pain, palpitations and leg swelling.  Gastrointestinal: Negative for vomiting.  Musculoskeletal: Negative for back pain.  Skin: Negative for rash.  Neurological: Negative for loss of consciousness and headaches.       Objective:    Physical Exam  Constitutional: She is oriented to person, place, and time. She appears well-developed and well-nourished. No distress.  HENT:  Head: Normocephalic and atraumatic.  Eyes: Conjunctivae are normal.  Neck: Normal range of motion. No thyromegaly present.  Cardiovascular: Normal rate and regular rhythm.   Pulmonary/Chest: Effort normal and breath sounds normal. She has no wheezes.  Abdominal: Soft. Bowel sounds are normal. There is no tenderness.  Musculoskeletal: Normal range of motion. She exhibits no edema or deformity.  Neurological: She is alert and oriented to person, place, and time.  Skin: Skin is warm and dry. She is not diaphoretic.  Psychiatric: She has a normal mood and affect.    BP 108/78 (BP Location: Left Arm, Cuff Size: Large)   Pulse 63   Temp 98.1 F (36.7 C) (Oral)   Resp 16   Ht _0  (1.651 m)   Wt 232 lb 6.4 oz (105.4 kg)   SpO2 98%   BMI 38.67 kg/m  Wt Readings from  Last 3 Encounters:  02/25/17 232 lb 6.4 oz (105.4 kg)  02/06/17 237 lb 10.5 oz (107.8 kg)  01/23/17 234 lb (106.1 kg)   BP Readings from Last 3 Encounters:  02/25/17 108/78  02/08/17 127/80  01/27/17 116/76     Immunization History  Administered Date(s) Administered  . Influenza Whole 05/05/2008  . PPD Test 07/02/2011, 07/07/2012, 07/13/2013, 07/06/2014, 08/11/2015  . Pneumococcal  Polysaccharide-23 09/21/2011  . Td 09/27/1999, 09/07/2010  . Zoster 06/20/2012    Health Maintenance  Topic Date Due  . PAP SMEAR  09/07/2017  . MAMMOGRAM  04/10/2018  . COLONOSCOPY  09/29/2018  . TETANUS/TDAP  09/07/2020  . Hepatitis C Screening  Completed  . HIV Screening  Completed    Lab Results  Component Value Date   WBC 6.0 02/08/2017   HGB 11.8 (L) 02/08/2017   HCT 34.9 (L) 02/08/2017   PLT 301 02/08/2017   GLUCOSE 124 (H) 02/08/2017   CHOL 201 (H) 03/23/2016   TRIG 180 (H) 03/23/2016   HDL 54 03/23/2016   LDLDIRECT 139.4 12/26/2011   LDLCALC 111 03/23/2016   ALT 21 02/05/2017   AST 23 02/05/2017   NA 139 02/08/2017   K 3.7 02/08/2017   CL 108 02/08/2017   CREATININE 0.82 02/08/2017   BUN 10 02/08/2017   CO2 24 02/08/2017   TSH 1.11 10/05/2016   INR 0.93 10/03/2016   HGBA1C 5.5 01/15/2017    Lab Results  Component Value Date   TSH 1.11 10/05/2016   Lab Results  Component Value Date   WBC 6.0 02/08/2017   HGB 11.8 (L) 02/08/2017   HCT 34.9 (L) 02/08/2017   MCV 85.5 02/08/2017   PLT 301 02/08/2017   Lab Results  Component Value Date   NA 139 02/08/2017   K 3.7 02/08/2017   CO2 24 02/08/2017   GLUCOSE 124 (H) 02/08/2017   BUN 10 02/08/2017   CREATININE 0.82 02/08/2017   BILITOT 0.3 02/05/2017   ALKPHOS 84 02/05/2017   AST 23 02/05/2017   ALT 21 02/05/2017   PROT 7.2 02/05/2017   ALBUMIN 3.5 02/05/2017   CALCIUM 8.7 (L) 02/08/2017   ANIONGAP 7 02/08/2017   GFR 85.25 10/23/2016   Lab Results  Component Value Date   CHOL 201 (H) 03/23/2016   Lab Results  Component Value Date   HDL 54 03/23/2016   Lab Results  Component Value Date   LDLCALC 111 03/23/2016   Lab Results  Component Value Date   TRIG 180 (H) 03/23/2016   Lab Results  Component Value Date   CHOLHDL 3.7 03/23/2016   Lab Results  Component Value Date   HGBA1C 5.5 01/15/2017         Assessment & Plan:   Problem List Items Addressed This Visit       Unprioritized   ABDOMINAL PAIN, EPIGASTRIC    Pt off dexilant Will f/u GI      Clostridium difficile colitis    Off abx Will see surgeon next week Pt cancelled her ID app because she did not feel it was necessary-- she will discuss this with surgeon       Other Visit Diagnoses    Clostridium difficile diarrhea    -  Primary      I am having Ms. Nipper maintain her acetaminophen, fexofenadine, FREESTYLE FREEDOM, albuterol, diclofenac sodium, I.L.X. B-12, Fluticasone-Salmeterol, FREESTYLE LITE, valACYclovir, CLINPRO 5000, lidocaine, albuterol, fluticasone, triamcinolone cream, methimazole, promethazine, oxyCODONE, ALPRAZolam, and atenolol.  No orders of the defined types  were placed in this encounter.   CMA served as Education administrator during this visit. History, Physical and Plan performed by medical provider. Documentation and orders reviewed and attested to.  [name], DO

## 2017-02-25 NOTE — Assessment & Plan Note (Signed)
Pt off dexilant Will f/u GI

## 2017-02-25 NOTE — Patient Instructions (Signed)
Clostridium Difficile Infection  Clostridium difficile (C. difficile or C. diff) infection is a condition that causes inflammation of the large intestine (colon). This condition can result in damage to the lining of your colon and may lead to colitis. This infection can be passed from person to person (is contagious).  What are the causes?  C. diff is a bacterium that is normally found in the colon. This infection is caused when the balance of C. diff is changed and there is an overgrowth of C. diff. This is often caused by antibiotic use.  What increases the risk?  This condition is more likely to develop in people who:  · Take antibiotic medicines.  · Take a certain type of medicine called proton pump inhibitors over a long period of time (chronic use).  · Are older.  · Have had a C. diff infection before.  · Have serious underlying conditions, such as colon cancer.  · Are in the hospital.  · Have a weak defense (immune) system.  · Live in a place where there is a lot of contact with others, such as a nursing home.  · Have had gastrointestinal (GI) tract surgery.    What are the signs or symptoms?  Symptoms of this condition include:  · Diarrhea. This may be bloody, watery, or yellow or green in color.  · Fever.  · Fatigue.  · Loss of appetite.  · Nausea.  · Swelling, pain, or tenderness in the abdomen.  · Dehydration. Dehydration can cause you to be tired and thirsty, have a dry mouth, and urinate less frequently.    How is this diagnosed?  This condition is diagnosed with a medical history and physical exam. You may also have tests, including:  · A test that checks for C. diff in your stool.  · Blood tests.  · A sigmoidoscopy or colonoscopy to look at your colon. These procedures involve passing an instrument through your rectum to look at the inside of your colon.    How is this treated?  Treatment for this condition includes:  · Antibiotics that keep C. diff from growing.  · Stopping the antibiotics you were  on before the C. diff infection began. Only do this as told by your health care provider.  · Fluids through an IV tube, if you are dehydrated.  · Surgery to remove the infected part of the colon. This is rare.    Follow these instructions at home:  Eating and drinking  · Drink enough fluid to keep your urine clear or pale yellow. Avoid milk, caffeine, and alcohol.  · Follow specific rehydration instructions as told by your health care provider.  · Eat small, frequent meals instead of large meals.  Medicines  · Take your antibiotic medicine as told by your health care provider. Do not stop taking the antibiotic even if you start to feel better unless your health care provider told you to do that.  · Take over-the-counter and prescription medicines only as told by your health care provider.  · Do not use medicines to help with diarrhea.  General instructions  · Wash your hands thoroughly before you prepare food and after you use the bathroom. Make sure people who live with you also wash their hands often.  · Clean surfaces that you touch with a product that contains chlorine bleach.  · Keep all follow-up visits as told by your health care provider. This is important.  Contact a health care provider if:  ·   Your symptoms do not get better with treatment.  · Your symptoms get worse with treatment.  · Your symptoms go away and then return.  · You have a fever.  · You have new symptoms.  Get help right away if:  · You have increasing pain or tenderness in your abdomen.  · You have stool that is mostly bloody, or your stool looks dark black and tarry.  · You cannot eat or drink without vomiting.  · You have signs of dehydration, such as:  ? Dark urine, very little urine, or no urine.  ? Cracked lips.  ? Not making tears when you cry.  ? Dry mouth.  ? Sunken eyes.  ? Sleepiness.  ? Weakness.  ? Dizziness.  This information is not intended to replace advice given to you by your health care provider. Make sure you discuss any  questions you have with your health care provider.  Document Released: 05/16/2005 Document Revised: 01/12/2016 Document Reviewed: 02/07/2015  Elsevier Interactive Patient Education © 2017 [employer]

## 2017-03-04 ENCOUNTER — Other Ambulatory Visit: Payer: Self-pay | Admitting: Family Medicine

## 2017-03-07 NOTE — Discharge Summary (Signed)
Patient ID: SHANECA ORNE [id number] 56 y.o. 08/23/1960  01/23/2017  Discharge date and time: 01/27/2017  3:38 PM  Admitting Physician: Rosario Adie  Discharge Physician: Rosario Adie.  Admission Diagnoses: diverticular disease  Discharge Diagnoses: same  Operations: Procedure(s): XI ROBOT SIGMOIDECTOMY    Discharged Condition: good    Hospital Course: patient admitted after surgery.  Her diet was advanced as tolerated.  She was discharged to home after she was tolerating her diet and having good bowel function  Consults: None  Significant Diagnostic Studies: labs:   Treatments: IV hydration and surgery: see above  Disposition: Home

## 2017-03-19 ENCOUNTER — Encounter: Payer: Self-pay | Admitting: Podiatry

## 2017-03-19 ENCOUNTER — Ambulatory Visit (INDEPENDENT_AMBULATORY_CARE_PROVIDER_SITE_OTHER): Payer: BC Managed Care – PPO | Admitting: Podiatry

## 2017-03-19 DIAGNOSIS — M21629 Bunionette of unspecified foot: Secondary | ICD-10-CM

## 2017-03-19 DIAGNOSIS — M779 Enthesopathy, unspecified: Secondary | ICD-10-CM | POA: Diagnosis not present

## 2017-03-19 NOTE — Progress Notes (Signed)
   Subjective:    Patient ID: Emily Osborne, female    DOB: [birth year], 56 y.o.   MRN: [id number]  HPI  Ms. Emily Osborne Presents the office they for pain to the right fifth toe. She denies any recent injury or trauma to the area. She states the areas painful last 2 months and has been intermittent in nature. She states on Friday she wore a new pair shoes and since then has been painful. She was using Voltaren gel as well as Lidoderm patches which did help. She denies any numbness or tingling to the area. Scars as a throbbing sensation within without shoes. She has no other concerns today. No recent injury.   Review of Systems  All other systems reviewed and are negative.      Objective:   Physical Exam General: AAO x3, NAD  Dermatological: Skin is warm, dry and supple bilateral. Nails x 10 are well manicured; remaining integument appears unremarkable at this time. There are no open sores, no preulcerative lesions, no rash or signs of infection present.  Vascular: Dorsalis Pedis artery and Posterior Tibial artery pedal pulses are 2/4 bilateral with immedate capillary fill time. There is no pain with calf compression, swelling, warmth, erythema.   Neruologic: Grossly intact via light touch bilateral. Vibratory intact via tuning fork bilateral. Protective threshold with Semmes Wienstein monofilament intact to all pedal sites bilateral.   Musculoskeletal: There is moderate Taylor's bunion deformity present the lateral aspect of the right foot. There is tenderness palpation in this area is small amount of fluid is palpable. There is no erythema or increase in warmth. Is no clinical signs of infection present. There is no the area tenderness at this time. Muscular strength 5/5 in all groups tested bilateral.  Gait: Unassisted, Nonantalgic.      Assessment & Plan:  56 year old female right second macular's bunion, capsulitis -Treatment options discussed including all alternatives, risks, and  complications -Etiology of symptoms were discussed -Discussed a steroid injection she wishes to proceed. Under standard conditions a mixture of dexamethasone onset was positioned into the wound that Taylor's bunion on the right foot without complications. -Offloading pads were dispensed -Discussed inserts for her shoes. -Discussed shoe gear modifications. -RTC in 6 weeks or sooner if needed.  Celesta Gentile, DPM

## 2017-03-21 ENCOUNTER — Telehealth: Payer: Self-pay | Admitting: Gastroenterology

## 2017-03-21 NOTE — Telephone Encounter (Signed)
Spoke to patient, she refused to see one of the APP's only wants to see Dr. Havery Moros. Her appointment is 8/27, checked and there is nothing sooner. She just saw Dr. Marcello Moores, said she was healing well from sigmoidectomy surgery. She had patient start taking Citrucel. Patient has not restarted taking her Dexilant or probiotic, is having a lot of bloating, frequent bm's. Patient admits to taking the Citrucel at 11:00 pm and was bothered by having to get up in the middle of night, she will adjust timing of this. Can she start taking the Dexilant and probiotic?

## 2017-03-22 NOTE — Telephone Encounter (Signed)
Called patient, unable to lvm due to mailbox being full.  

## 2017-03-22 NOTE — Telephone Encounter (Signed)
If she has having liquid stools / diarrhea, can send a Cdiff to ensure negative. Otherwise if just altered bowels, continue Citrucel, can take in the morning. She can resume Dexilant, and also can take a probiotic. She can try using florastor twice daily. Thanks

## 2017-03-25 ENCOUNTER — Telehealth: Payer: Self-pay

## 2017-03-25 ENCOUNTER — Encounter: Payer: Self-pay | Admitting: Family Medicine

## 2017-03-25 ENCOUNTER — Other Ambulatory Visit: Payer: Self-pay | Admitting: Family Medicine

## 2017-03-25 NOTE — Telephone Encounter (Signed)
Last refill  #30 no refills on 06/11/2016 Last office visit on 02/25/2017 Low risk next is due on 04/25/2017 Contract 09/07/2014

## 2017-03-25 NOTE — Telephone Encounter (Signed)
PA initiated via Covermymeds; KEY: [id number]. PA denied. Medication only covered if associated with post-herpetic neuralgia, diabetic neuropathy, and pain associated with cancer-related neuropathy (including treatment-related neuropathy)- per chart Pt doesn't have either.

## 2017-03-27 ENCOUNTER — Other Ambulatory Visit: Payer: Self-pay | Admitting: Family Medicine

## 2017-03-27 DIAGNOSIS — Z1231 Encounter for screening mammogram for malignant neoplasm of breast: Secondary | ICD-10-CM

## 2017-03-29 NOTE — Telephone Encounter (Signed)
Sent patient a message through My Chart.

## 2017-04-04 ENCOUNTER — Encounter: Payer: Self-pay | Admitting: Family Medicine

## 2017-04-04 ENCOUNTER — Ambulatory Visit (INDEPENDENT_AMBULATORY_CARE_PROVIDER_SITE_OTHER): Payer: BC Managed Care – PPO | Admitting: Family Medicine

## 2017-04-04 VITALS — BP 108/77 | HR 74 | Temp 97.6°F | Ht 65.0 in | Wt 235.0 lb

## 2017-04-04 DIAGNOSIS — E785 Hyperlipidemia, unspecified: Secondary | ICD-10-CM | POA: Diagnosis not present

## 2017-04-04 DIAGNOSIS — Z0000 Encounter for general adult medical examination without abnormal findings: Secondary | ICD-10-CM

## 2017-04-04 DIAGNOSIS — J4520 Mild intermittent asthma, uncomplicated: Secondary | ICD-10-CM

## 2017-04-04 LAB — LIPID PANEL
CHOL/HDL RATIO: 4
Cholesterol: 198 mg/dL (ref 0–200)
HDL: 51.4 mg/dL (ref >39.00)
LDL CALC: 120 mg/dL — AB (ref 0–99)
NONHDL: 146.78
TRIGLYCERIDES: 133 mg/dL (ref 0.0–149.0)
VLDL: 26.6 mg/dL (ref 0.0–40.0)

## 2017-04-04 LAB — COMPREHENSIVE METABOLIC PANEL
ALT: 28 U/L (ref 0–35)
AST: 26 U/L (ref 0–37)
Albumin: 4.1 g/dL (ref 3.5–5.2)
Alkaline Phosphatase: 67 U/L (ref 39–117)
BUN: 11 mg/dL (ref 6–23)
CALCIUM: 9.7 mg/dL (ref 8.4–10.5)
CHLORIDE: 104 meq/L (ref 96–112)
CO2: 30 meq/L (ref 19–32)
CREATININE: 0.9 mg/dL (ref 0.40–1.20)
GFR: 82.94 mL/min (ref >60.00)
GLUCOSE: 91 mg/dL (ref 70–99)
Potassium: 4.2 mEq/L (ref 3.5–5.1)
SODIUM: 141 meq/L (ref 135–145)
Total Bilirubin: 0.6 mg/dL (ref 0.2–1.2)
Total Protein: 6.9 g/dL (ref 6.0–8.3)

## 2017-04-04 LAB — CBC WITH DIFFERENTIAL/PLATELET
BASOS PCT: 0.6 % (ref 0.0–3.0)
Basophils Absolute: 0 10*3/uL (ref 0.0–0.1)
EOS ABS: 0.1 10*3/uL (ref 0.0–0.7)
Eosinophils Relative: 3 % (ref 0.0–5.0)
HCT: 42.8 % (ref 36.0–46.0)
Hemoglobin: 13.7 g/dL (ref 12.0–15.0)
LYMPHS ABS: 2.3 10*3/uL (ref 0.7–4.0)
LYMPHS PCT: 48.8 % — AB (ref 12.0–46.0)
MCHC: 32.1 g/dL (ref 30.0–36.0)
MCV: 91 fl (ref 78.0–100.0)
Monocytes Absolute: 0.5 10*3/uL (ref 0.1–1.0)
Monocytes Relative: 9.8 % (ref 3.0–12.0)
NEUTROS ABS: 1.8 10*3/uL (ref 1.4–7.7)
NEUTROS PCT: 37.8 % — AB (ref 43.0–77.0)
PLATELETS: 263 10*3/uL (ref 150.0–400.0)
RBC: 4.7 Mil/uL (ref 3.87–5.11)
RDW: 15.2 % (ref 11.5–15.5)
WBC: 4.7 10*3/uL (ref 4.0–10.5)

## 2017-04-04 LAB — POC URINALSYSI DIPSTICK (AUTOMATED)
Bilirubin, UA: NEGATIVE
Blood, UA: NEGATIVE
Glucose, UA: NEGATIVE
KETONES UA: NEGATIVE
LEUKOCYTES UA: NEGATIVE
NITRITE UA: NEGATIVE
PH UA: 6 (ref 5.0–8.0)
PROTEIN UA: NEGATIVE
Spec Grav, UA: 1.02 (ref 1.010–1.025)
Urobilinogen, UA: 0.2 E.U./dL

## 2017-04-04 MED ORDER — VALACYCLOVIR HCL 1 G PO TABS: 1000.0000 mg | ORAL_TABLET | Freq: Three times a day (TID) | ORAL | 5 refills | Status: DC

## 2017-04-04 MED ORDER — FLUTICASONE PROPIONATE 50 MCG/ACT NA SUSP: NASAL | 4 refills | Status: DC

## 2017-04-04 MED ORDER — ALBUTEROL SULFATE HFA 108 (90 BASE) MCG/ACT IN AERS: INHALATION_SPRAY | RESPIRATORY_TRACT | 2 refills | Status: DC

## 2017-04-04 MED ORDER — FEXOFENADINE HCL 180 MG PO TABS: 180.0000 mg | ORAL_TABLET | Freq: Every day | ORAL | 3 refills | Status: DC

## 2017-04-04 MED ORDER — ALPRAZOLAM 0.25 MG PO TABS: 0.2500 mg | ORAL_TABLET | Freq: Three times a day (TID) | ORAL | 2 refills | Status: DC | PRN

## 2017-04-04 MED ORDER — FLUTICASONE-SALMETEROL 250-50 MCG/DOSE IN AEPB: 1.0000 | INHALATION_SPRAY | Freq: Two times a day (BID) | RESPIRATORY_TRACT | 2 refills | Status: DC

## 2017-04-04 NOTE — Progress Notes (Signed)
Pre visit review using our clinic review tool, if applicable. No additional management support is needed unless otherwise documented below in the visit note. 

## 2017-04-04 NOTE — Progress Notes (Signed)
Subjective:     Emily Osborne is a 56 y.o. female and is here for a comprehensive physical exam. The patient reports no problems.  She was in the hosp in June for C diff and has had diverticulitis.    Social History   Social History  . Marital status: Divorced    Spouse name: N/A  . Number of children: 2  . Years of education: N/A   Occupational History  . Nurse/teacher    Social History Main Topics  . Smoking status: Never Smoker  . Smokeless tobacco: Never Used  . Alcohol use No  . Drug use: No  . Sexual activity: Not Currently    Partners: Male   Other Topics Concern  . Not on file   Social History Narrative   Lives with alone.  She has two daughter.   She works as a Programme researcher, broadcasting/film/video at [employer]--- no   Health Maintenance  Topic Date Due  . PAP SMEAR  09/07/2017  . MAMMOGRAM  04/10/2018  . COLONOSCOPY  09/29/2018  . TETANUS/TDAP  09/07/2020  . Hepatitis C Screening  Completed  . HIV Screening  Completed    The following portions of the patient's history were reviewed and updated as appropriate:  She  has a past medical history of Allergy; Anxiety; Asthma; C. difficile colitis; Cataract; Clostridium difficile colitis (12/13/2016); Diverticulitis; Dysrhythmia; Edema; Elevated blood pressure reading without diagnosis of hypertension; Fatty liver; Fibromyalgia; Ganglion of joint; GERD (gastroesophageal reflux disease); H. pylori infection; Headache; Helicobacter pylori (H. pylori) infection (12/13/2016); Herpes zoster without mention of complication; Hyperlipidemia; Hyperthyroidism; IBS (irritable bowel syndrome); Nontraumatic rupture of Achilles tendon; Other acute reactions to stress; Pain in joint, shoulder region; Pain in joint, upper arm; Pain in limb; Personal history of other diseases of digestive system; Pneumonia; PONV (postoperative nausea and vomiting); Routine screening for STI (sexually transmitted infection) (12/13/2016); and Thyroid disease. She  does  not have any pertinent problems on file. She  has a past surgical history that includes Abdominal hysterectomy; Tubal ligation; Bunionectomy (Bilateral); Achilles tendon repair (Right, 2005); Ganglion cyst excision (Right); Meniscus repair (Right, 04/2014); wisdomteeth extraction; LEFT HEART CATH AND CORONARY ANGIOGRAPHY (N/A, 10/03/2016); and Colon surgery. Her family history includes Cancer in her brother; Coronary artery disease in her brother; Diabetes in her father; Heart disease in her father; Hyperlipidemia in her mother; Hypertension in her brother, father, and mother; Kidney disease in her father; Leukemia in her brother; Lupus in her daughter; Pernicious anemia in her sister; Thyroid disease in her mother and sister. She  reports that she has never smoked. She has never used smokeless tobacco. She reports that she does not drink alcohol or use drugs. She has a current medication list which includes the following prescription(s): acetaminophen, albuterol, albuterol, alprazolam, atenolol, freestyle freedom, clinpro 5000, diclofenac sodium, fexofenadine, fluticasone, fluticasone-salmeterol, freestyle lite, i.l.x. b-12, lidocaine, methimazole, promethazine, triamcinolone cream, valacyclovir, and oxycodone. Current Outpatient Prescriptions on File Prior to Visit  Medication Sig Dispense Refill  . acetaminophen (TYLENOL) 500 MG tablet Take 1,000 mg by mouth every 6 (six) hours as needed for moderate pain.     Marland Kitchen albuterol (PROVENTIL) (2.5 MG/3ML) 0.083% nebulizer solution Take 3 mLs (2.5 mg total) by nebulization every 6 (six) hours as needed for wheezing or shortness of breath. 75 mL 3  . atenolol (TENORMIN) 25 MG tablet Take 0.5 tablets (12.5 mg total) by mouth daily. Takes 0.5 tablet 30 tablet 0  . Blood Glucose Monitoring Suppl (FREESTYLE FREEDOM)  KIT 1 Device by Does not apply route daily. 1 each 0  . CLINPRO 5000 1.1 % PSTE Take 1 application by mouth See admin instructions. Brush teeth nightly  4   . diclofenac sodium (VOLTAREN) 1 % GEL Apply 2 g topically 2 (two) times daily as needed (pain). For pain    . FREESTYLE LITE test strip USE TO CHECK BLOOD SUGAR ONCE A DAY 100 each 0  . Iron Combinations (I.L.X. B-12) ELIX Take 1 each by mouth daily as needed (energy). 1 droplet full    . lidocaine (LIDODERM) 5 % APPLY 1 PATCH ON THE SKIN EVERY 12 HRS REMOVE, DISCARD PATCH WITHIN 12 HRS OR AS DIRECTED 30 patch 0  . methimazole (TAPAZOLE) 5 MG tablet Take 0.5 tablets (2.5 mg total) by mouth daily. (Patient taking differently: Take 2.5 mg by mouth daily. Takes 0.5 tablet) 90 tablet 0  . promethazine (PHENERGAN) 25 MG tablet Take 1 tablet (25 mg total) by mouth 4 (four) times daily as needed for nausea or vomiting. 30 tablet 0  . triamcinolone cream (KENALOG) 0.1 % APPLY TO AFFECTED AREA TWICE A DAY (Patient taking differently: APPLY TO AFFECTED AREA TWICE A DAY AS NEEDED FOR RASH) 30 g 0  . oxyCODONE (OXY IR/ROXICODONE) 5 MG immediate release tablet Take 1-2 tablets (5-10 mg total) by mouth every 4 (four) hours as needed for moderate pain, severe pain or breakthrough pain. (Patient not taking: Reported on 04/04/2017) 30 tablet 0   No current facility-administered medications on file prior to visit.    She is allergic to aspirin; gentamicin; ibuprofen; nsaids; doxycycline; influenza a (h1n1) monoval vac; loratadine; metronidazole; periactin [cyproheptadine]; ranitidine hcl; sulfamethoxazole-trimethoprim; and tramadol hcl..  Review of Systems Review of Systems  Constitutional: Negative for activity change, appetite change and fatigue.  HENT: Negative for hearing loss, congestion, tinnitus and ear discharge.  dentist q6m Eyes: Negative for visual disturbance (see optho q1y -- vision corrected to 20/20 with glasses).  Respiratory: Negative for cough, chest tightness and shortness of breath.   Cardiovascular: Negative for chest pain, palpitations and leg swelling.  Gastrointestinal: Negative for  abdominal pain, diarrhea, constipation and abdominal distention.  Genitourinary: Negative for urgency, frequency, decreased urine volume and difficulty urinating.  Musculoskeletal: Negative for back pain, arthralgias and gait problem.  Skin: Negative for color change, pallor and rash.  Neurological: Negative for dizziness, light-headedness, numbness and headaches.  Hematological: Negative for adenopathy. Does not bruise/bleed easily.  Psychiatric/Behavioral: Negative for suicidal ideas, confusion, sleep disturbance, self-injury, dysphoric mood, decreased concentration and agitation.       Objective:    BP 108/77 (BP Location: Left Arm, Patient Position: Sitting, Cuff Size: Large)   Pulse 74   Temp 97.6 F (36.4 C) (Oral)   Ht 5' 5" (1.651 m)   Wt 235 lb (106.6 kg)   SpO2 97%   BMI 39.11 kg/m  General appearance: alert, cooperative, appears stated age and no distress Head: Normocephalic, without obvious abnormality, atraumatic Eyes: conjunctivae/corneas clear. PERRL, EOM's intact. Fundi benign. Ears: normal TM's and external ear canals both ears Nose: Nares normal. Septum midline. Mucosa normal. No drainage or sinus tenderness. Throat: lips, mucosa, and tongue normal; teeth and gums normal Neck: no adenopathy, no carotid bruit, no JVD, supple, symmetrical, trachea midline and thyroid not enlarged, symmetric, no tenderness/mass/nodules Back: symmetric, no curvature. ROM normal. No CVA tenderness. Lungs: clear to auscultation bilaterally Breasts: normal appearance, no masses or tenderness Heart: regular rate and rhythm, S1, S2 normal, no murmur, click, rub   or gallop Abdomen: soft, non-tender; bowel sounds normal; no masses,  no organomegaly Pelvic: not indicated; status post hysterectomy, negative ROS Extremities: extremities normal, atraumatic, no cyanosis or edema Pulses: 2+ and symmetric Skin: Skin color, texture, turgor normal. No rashes or lesions Lymph nodes: Cervical,  supraclavicular, and axillary nodes normal. Neurologic: Alert and oriented X 3, normal strength and tone. Normal symmetric reflexes. Normal coordination and gait    Assessment:    Healthy female exam.      Plan:    ghm utd Check labs See After Visit Summary for Counseling Recommendations    1. Hyperlipidemia, unspecified hyperlipidemia type Tolerating statin, encouraged heart healthy diet, avoid trans fats, minimize simple carbs and saturated fats. Increase exercise as tolerated - Lipid panel - Comprehensive metabolic panel - CBC with Differential/Platelet - POCT Urinalysis Dipstick (Automated)  2. Asthma, chronic, mild intermittent, uncomplicated - Fluticasone-Salmeterol (ADVAIR DISKUS) 250-50 MCG/DOSE AEPB; Inhale 1 puff into the lungs 2 (two) times daily.  Dispense: 60 each; Refill: 2 - Lipid panel - Comprehensive metabolic panel - CBC with Differential/Platelet - POCT Urinalysis Dipstick (Automated)  3. Preventative health care See above - Lipid panel - Comprehensive metabolic panel - CBC with Differential/Platelet - POCT Urinalysis Dipstick (Automated)

## 2017-04-04 NOTE — Patient Instructions (Signed)
Preventive Care 40-64 Years, Female Preventive care refers to lifestyle choices and visits with your health care provider that can promote health and wellness. What does preventive care include?  A yearly physical exam. This is also called an annual well check.  Dental exams once or twice a year.  Routine eye exams. Ask your health care provider how often you should have your eyes checked.  Personal lifestyle choices, including: ? Daily care of your teeth and gums. ? Regular physical activity. ? Eating a healthy diet. ? Avoiding tobacco and drug use. ? Limiting alcohol use. ? Practicing safe sex. ? Taking low-dose aspirin daily starting at age 58. ? Taking vitamin and mineral supplements as recommended by your health care provider. What happens during an annual well check? The services and screenings done by your health care provider during your annual well check will depend on your age, overall health, lifestyle risk factors, and family history of disease. Counseling Your health care provider may ask you questions about your:  Alcohol use.  Tobacco use.  Drug use.  Emotional well-being.  Home and relationship well-being.  Sexual activity.  Eating habits.  Work and work Statistician.  Method of birth control.  Menstrual cycle.  Pregnancy history.  Screening You may have the following tests or measurements:  Height, weight, and BMI.  Blood pressure.  Lipid and cholesterol levels. These may be checked every 5 years, or more frequently if you are over 81 years old.  Skin check.  Lung cancer screening. You may have this screening every year starting at age 78 if you have a 30-pack-year history of smoking and currently smoke or have quit within the past 15 years.  Fecal occult blood test (FOBT) of the stool. You may have this test every year starting at age 65.  Flexible sigmoidoscopy or colonoscopy. You may have a sigmoidoscopy every 5 years or a colonoscopy  every 10 years starting at age 30.  Hepatitis C blood test.  Hepatitis B blood test.  Sexually transmitted disease (STD) testing.  Diabetes screening. This is done by checking your blood sugar (glucose) after you have not eaten for a while (fasting). You may have this done every 1-3 years.  Mammogram. This may be done every 1-2 years. Talk to your health care provider about when you should start having regular mammograms. This may depend on whether you have a family history of breast cancer.  BRCA-related cancer screening. This may be done if you have a family history of breast, ovarian, tubal, or peritoneal cancers.  Pelvic exam and Pap test. This may be done every 3 years starting at age 80. Starting at age 36, this may be done every 5 years if you have a Pap test in combination with an HPV test.  Bone density scan. This is done to screen for osteoporosis. You may have this scan if you are at high risk for osteoporosis.  Discuss your test results, treatment options, and if necessary, the need for more tests with your health care provider. Vaccines Your health care provider may recommend certain vaccines, such as:  Influenza vaccine. This is recommended every year.  Tetanus, diphtheria, and acellular pertussis (Tdap, Td) vaccine. You may need a Td booster every 10 years.  Varicella vaccine. You may need this if you have not been vaccinated.  Zoster vaccine. You may need this after age 5.  Measles, mumps, and rubella (MMR) vaccine. You may need at least one dose of MMR if you were born in  1958 or later. You may also need a second dose.  Pneumococcal 13-valent conjugate (PCV13) vaccine. You may need this if you have certain conditions and were not previously vaccinated.  Pneumococcal polysaccharide (PPSV23) vaccine. You may need one or two doses if you smoke cigarettes or if you have certain conditions.  Meningococcal vaccine. You may need this if you have certain  conditions.  Hepatitis A vaccine. You may need this if you have certain conditions or if you travel or work in places where you may be exposed to hepatitis A.  Hepatitis B vaccine. You may need this if you have certain conditions or if you travel or work in places where you may be exposed to hepatitis B.  Haemophilus influenzae type b (Hib) vaccine. You may need this if you have certain conditions.  Talk to your health care provider about which screenings and vaccines you need and how often you need them. This information is not intended to replace advice given to you by your health care provider. Make sure you discuss any questions you have with your health care provider. Document Released: 09/02/2015 Document Revised: 04/25/2016 Document Reviewed: 06/07/2015 Elsevier Interactive Patient Education  2017 [employer].

## 2017-04-04 NOTE — Assessment & Plan Note (Signed)
Encouraged heart healthy diet, increase exercise, avoid trans fats, consider a krill oil cap daily 

## 2017-04-08 ENCOUNTER — Other Ambulatory Visit: Payer: Self-pay | Admitting: Family Medicine

## 2017-04-08 ENCOUNTER — Telehealth: Payer: Self-pay | Admitting: Family Medicine

## 2017-04-08 DIAGNOSIS — E785 Hyperlipidemia, unspecified: Secondary | ICD-10-CM

## 2017-04-08 NOTE — Telephone Encounter (Signed)
See result notes. 

## 2017-04-08 NOTE — Telephone Encounter (Signed)
Pt returned call for lab results.

## 2017-04-12 ENCOUNTER — Ambulatory Visit (INDEPENDENT_AMBULATORY_CARE_PROVIDER_SITE_OTHER): Payer: BC Managed Care – PPO | Admitting: Orthopedic Surgery

## 2017-04-13 ENCOUNTER — Other Ambulatory Visit: Payer: Self-pay | Admitting: Internal Medicine

## 2017-04-15 ENCOUNTER — Ambulatory Visit (INDEPENDENT_AMBULATORY_CARE_PROVIDER_SITE_OTHER): Payer: BC Managed Care – PPO | Admitting: Gastroenterology

## 2017-04-15 ENCOUNTER — Encounter: Payer: Self-pay | Admitting: Gastroenterology

## 2017-04-15 VITALS — BP 120/76 | HR 71 | Ht 65.0 in | Wt 236.0 lb

## 2017-04-15 DIAGNOSIS — R140 Abdominal distension (gaseous): Secondary | ICD-10-CM | POA: Diagnosis not present

## 2017-04-15 DIAGNOSIS — Z8619 Personal history of other infectious and parasitic diseases: Secondary | ICD-10-CM | POA: Diagnosis not present

## 2017-04-15 DIAGNOSIS — R159 Full incontinence of feces: Secondary | ICD-10-CM

## 2017-04-15 DIAGNOSIS — K760 Fatty (change of) liver, not elsewhere classified: Secondary | ICD-10-CM | POA: Diagnosis not present

## 2017-04-15 DIAGNOSIS — R194 Change in bowel habit: Secondary | ICD-10-CM | POA: Diagnosis not present

## 2017-04-15 MED ORDER — VSL#3 PO CAPS: 1.0000 | ORAL_CAPSULE | Freq: Two times a day (BID) | ORAL | 3 refills | Status: DC

## 2017-04-15 NOTE — Patient Instructions (Signed)
We have referred you for Pelvic Floor Physical Therapy.  You will be contacted to make your appointment.  If you haven't heard from them within a week or two, please let us know.  We have sent the following medications to your pharmacy for you to pick up at your convenience: VSL#3, Take 1 capsule twice a day. We are giving you samples today to make sure you tolerate them well.  Your physician has requested that you go to the basement for the following lab work in 3 weeks: H Pylori antigen.  Please stop your Dexilant for 2 to 3 weeks prior to completing this test.   Continue using the barrier cream you have.  If you are age 80 or older, your body mass index should be between 23-30. Your Body mass index is 39.27 kg/m. If this is out of the aforementioned range listed, please consider follow up with your Primary Care Provider.  If you are age 21 or younger, your body mass index should be between 19-25. Your Body mass index is 39.27 kg/m. If this is out of the aformentioned range listed, please consider follow up with your Primary Care Provider.

## 2017-04-15 NOTE — Progress Notes (Signed)
HPI :  56 y/o female with history of H pylori, diverticulitis, GERD, here for a follow up visit. See prior visit for details of her case.   Since her last visit she had a sigmoid resection for diverticulitis in June per Dr. Marcello Moores - her course was complicated by C diff. She had a prolonged course and hospitalized again for C diff in June, was treated with Vancomycin. She had refractory H pylori despite 2 treatment regimens and I referred her to infectious diseases. She completed 14 days of Rifabutin and Amoxicillin, but has not had follow up eradication testing yet.  She's had a follow up MRCP for dilated PD - this was a normal study without any pancreatic pathology. Fatty liver noted.   She reports she has been doing pretty well in general recently but has some new complaints. She has fecal leakage which has been bothering her, burning her rectum when she leaks. She uses a barrier cream which helps. She thinks ongoing since February, this is the first time she has mentioned this to me. She is having about 1 BM per day - she is using Citrucel once daily, she uses it once every other day. She is not straining. She can have leakage at any time, which can stain her underwear. The burning is bothering her. She is using cottonelle wipes to clean herself since her surgery. The burning is what bothers her most. No blood in her stools. No diarrhea at this time. She has had 2 vaginal delivery. She does have occasional urine leakage / stress incontinence. She can have some leakage of stool if she laughs or bears down at times.  She has some abdominal bloating which has continued to bother her. She had stopped her Dexilant for a period of time and then resumed the Amelia which has been helping.   Endoscopic history: EGD 04/2008 - normal esophagus, H pylori gastriits, normal duodenum Colonoscopy in 2012 with a 70m descending colon polyp removed but not retrieved. Colonoscopy 09/30/2015 - seven small colon  polyps - adenomas, sessile serrated, recall in 3 years, pancolonic diverticulosis, and hemorrhoids EGD 05/17/16 - normal esophagus, benign gastric polyps, gastritis, normal duodenum. Biopsies show no celiac, gastric polyps benign, H pylori gastritis noted     Past Medical History:  Diagnosis Date  . Allergy   . Anxiety   . Asthma    pt has inhaler  . C. difficile colitis   . Cataract    bil eyes  . Clostridium difficile colitis 12/13/2016  . Diverticulitis   . Dysrhythmia    palpitations  . Edema   . Elevated blood pressure reading without diagnosis of hypertension    "just elevated when I'm in pain" (12/07/2015)  . Fatty liver   . Fibromyalgia   . Ganglion of joint    right wrist  . GERD (gastroesophageal reflux disease)   . H. pylori infection   . Headache    hx of  . Helicobacter pylori (H. pylori) infection 12/13/2016  . Herpes zoster without mention of complication   . Hyperlipidemia   . Hyperthyroidism   . IBS (irritable bowel syndrome)   . Nontraumatic rupture of Achilles tendon   . Other acute reactions to stress   . Pain in joint, shoulder region   . Pain in joint, upper arm   . Pain in limb   . Personal history of other diseases of digestive system   . Pneumonia    once  . PONV (postoperative nausea and  vomiting)   . Routine screening for STI (sexually transmitted infection) 12/13/2016  . Thyroid disease    hyperthyroidism     Past Surgical History:  Procedure Laterality Date  . ABDOMINAL HYSTERECTOMY    . ACHILLES TENDON REPAIR Right 2005  . BUNIONECTOMY Bilateral    Bunionectomy 1983  . COLON SURGERY    . GANGLION CYST EXCISION Right    wrist  . LEFT HEART CATH AND CORONARY ANGIOGRAPHY N/A 10/03/2016   Procedure: Left Heart Cath and Coronary Angiography;  Surgeon: Burnell Blanks, MD;  Location: [facility];  Service: Cardiovascular;  Laterality: N/A;  . MENISCUS REPAIR Right 04/2014  . TUBAL LIGATION    . wisdomteeth extraction      Family History  Problem Relation Age of Onset  . Hypertension Father   . Diabetes Father   . Heart disease Father   . Kidney disease Father        Died, 17  . Leukemia Brother   . Cancer Brother        myleoblastic anemia  . Hyperlipidemia Mother   . Hypertension Mother        Died, 15  . Thyroid disease Mother        Thyroid surgery  . Pernicious anemia Sister   . Thyroid disease Sister        On thyroid Rx  . Lupus Daughter   . Coronary artery disease Brother   . Hypertension Brother   . Colon cancer Neg Hx   . Esophageal cancer Neg Hx   . Rectal cancer Neg Hx   . Stomach cancer Neg Hx   . Pancreatic cancer Neg Hx    Social History  Substance Use Topics  . Smoking status: Never Smoker  . Smokeless tobacco: Never Used  . Alcohol use No   Current Outpatient Prescriptions  Medication Sig Dispense Refill  . acetaminophen (TYLENOL) 500 MG tablet Take 1,000 mg by mouth every 6 (six) hours as needed for moderate pain.     Marland Kitchen albuterol (PROAIR HFA) 108 (90 Base) MCG/ACT inhaler USE 2 PUFFS EVERY 4 HOURS AS NEEDED FOR COUGH, WHEEZE OR SHORTNESS OF BREATH. 8.5 each 2  . albuterol (PROVENTIL) (2.5 MG/3ML) 0.083% nebulizer solution Take 3 mLs (2.5 mg total) by nebulization every 6 (six) hours as needed for wheezing or shortness of breath. 75 mL 3  . ALPRAZolam (XANAX) 0.25 MG tablet Take 1 tablet (0.25 mg total) by mouth 3 (three) times daily as needed. 30 tablet 2  . atenolol (TENORMIN) 25 MG tablet Take 0.5 tablets (12.5 mg total) by mouth daily. Takes 0.5 tablet 30 tablet 0  . Blood Glucose Monitoring Suppl (FREESTYLE FREEDOM) KIT 1 Device by Does not apply route daily. 1 each 0  . CLINPRO 5000 1.1 % PSTE Take 1 application by mouth See admin instructions. Brush teeth nightly  4  . diclofenac sodium (VOLTAREN) 1 % GEL Apply 2 g topically 2 (two) times daily as needed (pain). For pain    . fexofenadine (ALLEGRA) 180 MG tablet Take 1 tablet (180 mg total) by mouth daily. 90  tablet 3  . Flaxseed, Linseed, (FLAXSEED OIL PO) Take by mouth.    . fluticasone (FLONASE) 50 MCG/ACT nasal spray PLACE 2 SPRAYS INTO BOTH NOSTRILS DAILY. 16 g 4  . Fluticasone-Salmeterol (ADVAIR DISKUS) 250-50 MCG/DOSE AEPB Inhale 1 puff into the lungs 2 (two) times daily. 60 each 2  . FREESTYLE LITE test strip USE TO CHECK BLOOD SUGAR ONCE A  DAY 100 each 0  . Iron Combinations (I.L.X. B-12) ELIX Take 1 each by mouth daily as needed (energy). 1 droplet full    . lidocaine (LIDODERM) 5 % APPLY 1 PATCH ON THE SKIN EVERY 12 HRS REMOVE, DISCARD PATCH WITHIN 12 HRS OR AS DIRECTED 30 patch 0  . methimazole (TAPAZOLE) 5 MG tablet Take 0.5 tablets (2.5 mg total) by mouth daily. (Patient taking differently: Take 2.5 mg by mouth daily. Takes 0.5 tablet) 90 tablet 0  . promethazine (PHENERGAN) 25 MG tablet Take 1 tablet (25 mg total) by mouth 4 (four) times daily as needed for nausea or vomiting. 30 tablet 0  . triamcinolone cream (KENALOG) 0.1 % APPLY TO AFFECTED AREA TWICE A DAY (Patient taking differently: APPLY TO AFFECTED AREA TWICE A DAY AS NEEDED FOR RASH) 30 g 0  . valACYclovir (VALTREX) 1000 MG tablet Take 1 tablet (1,000 mg total) by mouth 3 (three) times daily. 30 tablet 5   No current facility-administered medications for this visit.    Allergies  Allergen Reactions  . Aspirin Other (See Comments)    REACTION: anaphylaxis  . Gentamicin Other (See Comments)    Eye drops turned the sclera bright red  . Ibuprofen Other (See Comments)    REACTION: anaphylaxsis  . Nsaids Other (See Comments)    REACTION: anaphylaxis  . Doxycycline Other (See Comments)    REACTION: severe nausea/vomiting  . Influenza A (H1n1) Monoval Vac Other (See Comments)    REACTION: sick for 3 weeks  . Loratadine Other (See Comments)    Fatigue/weakness  . Metronidazole Other (See Comments)    REACTION: red face/swelling  . Periactin [Cyproheptadine] Other (See Comments)    paranoid   . Ranitidine Hcl Other (See  Comments)    REACTION: Lips turned red /peel  . Sulfamethoxazole-Trimethoprim Other (See Comments)    REACTION: face red/peel  . Tramadol Hcl Other (See Comments)    REACTION: paranoid     Review of Systems: All systems reviewed and negative except where noted in HPI.   Lab Results  Component Value Date   WBC 4.7 04/04/2017   HGB 13.7 04/04/2017   HCT 42.8 04/04/2017   MCV 91.0 04/04/2017   PLT 263.0 04/04/2017    Lab Results  Component Value Date   CREATININE 0.90 04/04/2017   BUN 11 04/04/2017   NA 141 04/04/2017   K 4.2 04/04/2017   CL 104 04/04/2017   CO2 30 04/04/2017    Lab Results  Component Value Date   ALT 28 04/04/2017   AST 26 04/04/2017   ALKPHOS 67 04/04/2017   BILITOT 0.6 04/04/2017     Physical Exam: BP 120/76 (BP Location: Left Arm, Patient Position: Sitting, Cuff Size: Normal)   Pulse 71   Ht _0  (1.651 m)   Wt 236 lb (107 kg)   SpO2 99%   BMI 39.27 kg/m  Constitutional: Pleasant,well-developed,female in no acute distress. HEENT: Normocephalic and atraumatic. Conjunctivae are normal. No scleral icterus. Neck supple.  Cardiovascular: Normal rate, regular rhythm.  Pulmonary/chest: Effort normal and breath sounds normal. No wheezing, rales or rhonchi. Abdominal: Soft, nondistended, nontender. There are no masses palpable. No hepatomegaly. DRE - low resting tone, normal decent, mildly low squeeze pressure - no mass , no fissure or perianal rash appreciated Extremities: no edema Lymphadenopathy: No cervical adenopathy noted. Neurological: Alert and oriented to person place and time. Skin: Skin is warm and dry. No rashes noted. Psychiatric: Normal mood and affect. Behavior is normal.  ASSESSMENT AND PLAN: 56 year old female here for reassessment of the following issues:  Fecal leakage - she has this in conjunction with urinary stress incontinence. She has low anal tone DRE. Colonoscopy is up-to-date. Recommend referral to pelvic floor  physical therapy to see if this can help strengthen the pelvic floor and minimize leakage. She should continue her fiber supplementation daily. She can continue to use barrier cream on the perianal area to minimize irritation. She can follow-up as needed if symptoms persist despite pelvic floor PT.  History of H. Pylori / abdominal discomfort - was not eradicated with clarithromycin and amoxicillin. We then treated her with Levaquin and amoxicillin given her Flagyl allergy, had a persistently positive stool antigen. I referred her to infectious diseases, she completed two-week course of Rifabutin and Amoxicillin. She is due for eradication testing. She should stop her Dexilant at this time for the next 2-3 weeks prior to submitting a stool antigen test. If this test is positive she will need an endoscopy with additional testing for H. pylori to obtain sensitivities in order to eradicate it.   Bloating - a chronic issue for her, prior MRCP looked okay, she is now status post surgery for diverticulitis. We will give her a trial of VSL#3 one capsule twice daily to see if this helps  Fatty liver - noted incidentally on imaging previously. Her LFTs are normal. I suspect this is likely related to her weight gain. I previously counseled her on the spectrum of fatty liver disease and that she needs to have her LFTs checked once yearly. Recommend goal weight loss to normalize BMI and to minimize her alcohol use. Encouraged her to drink coffee routinely which can help prevent fibrotic changes.   Cellar, MD [facility] Pager [phone]

## 2017-04-18 IMAGING — CT CT ABD-PELV W/ CM
2 of 5 series · 17 of 46 positions shown, 19 images · IV contrast (APPLIED)
Comparison: 12/07/2015 CT

CLINICAL DATA: 55-year-old female with abdominal and pelvic pain
for 6 days.

EXAM:
CT ABDOMEN AND PELVIS WITH CONTRAST
TECHNIQUE: Multidetector CT imaging of the abdomen and pelvis was performed
using the standard protocol following bolus administration of
intravenous contrast.
CONTRAST:  100mL [id number] IOPAMIDOL ([id number]) INJECTION 61%

[Series 2: axial st · axial · 0.98mm/px · z∈[-496,-46]mm · 14 of 101 slices shown, 16 images]
[im 6/101  soft-tissue]
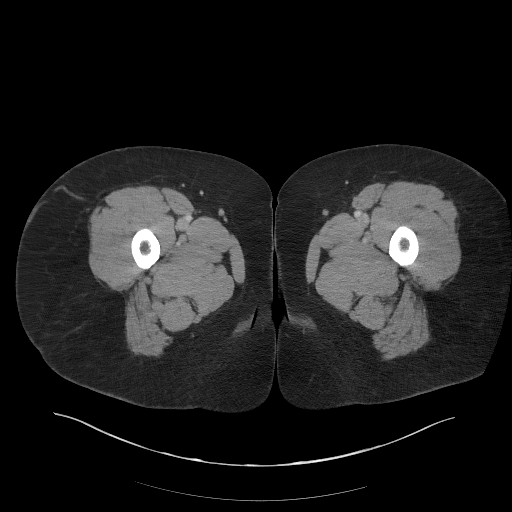
[im 6/101  bone]
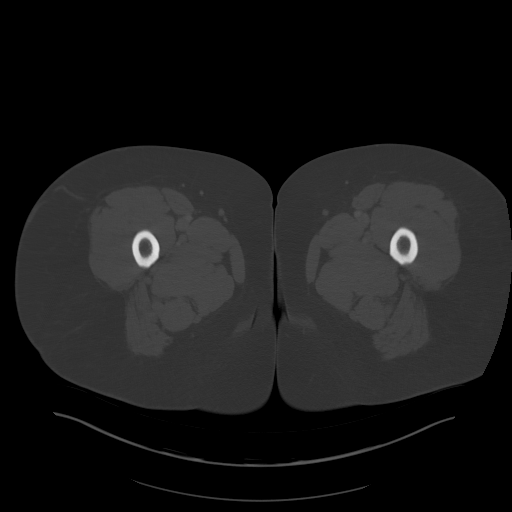
[im 16/101  soft-tissue]
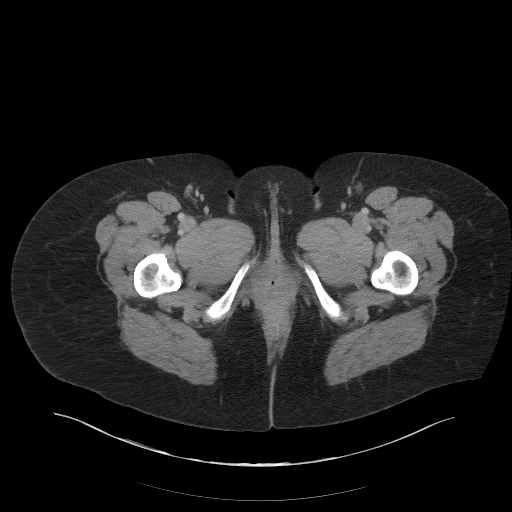
[im 21/101  soft-tissue]
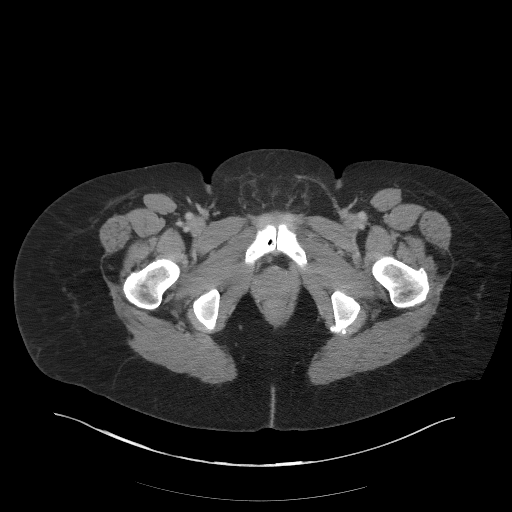
[im 26/101  soft-tissue]
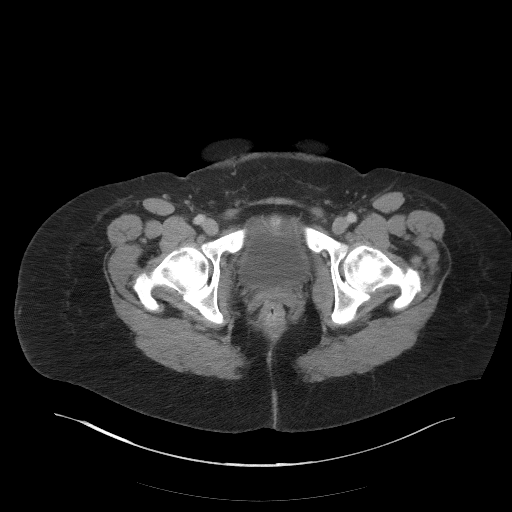
[im 36/101  soft-tissue]
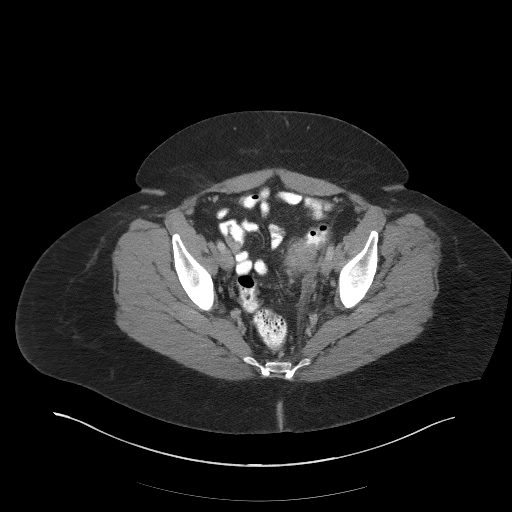
[im 41/101  soft-tissue]
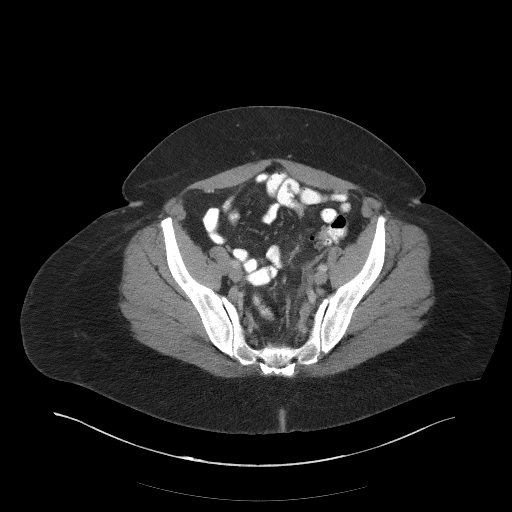
[im 46/101  soft-tissue]
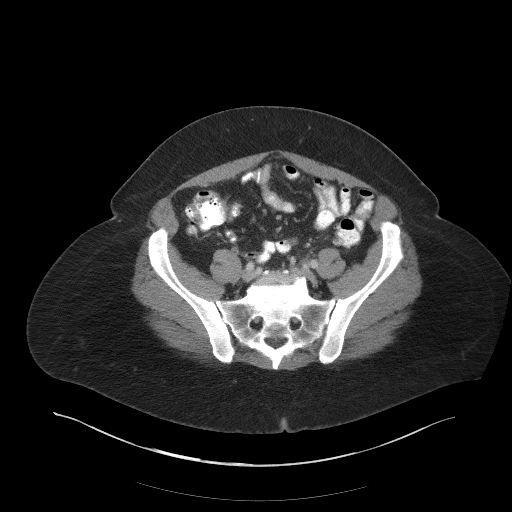
[im 56/101  soft-tissue]
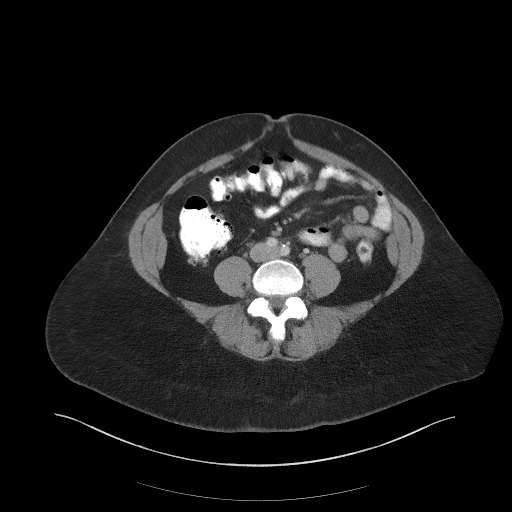
[im 61/101  soft-tissue]
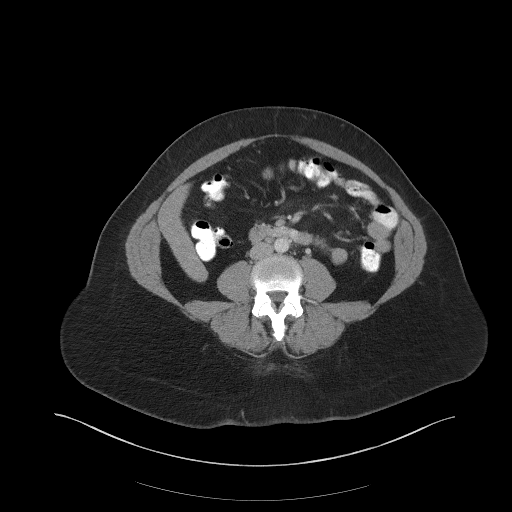
[im 61/101  bone]
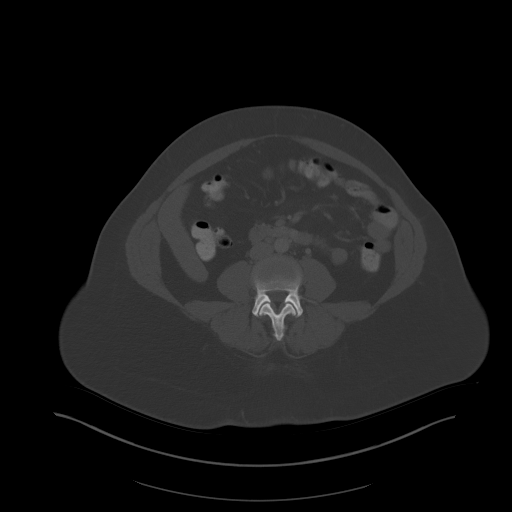
[im 66/101  soft-tissue]
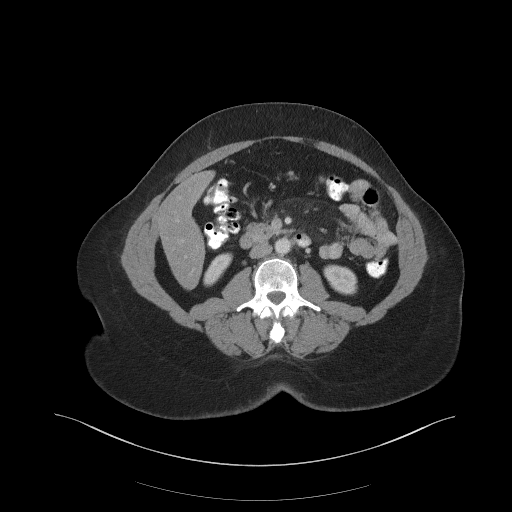
[im 76/101  soft-tissue]
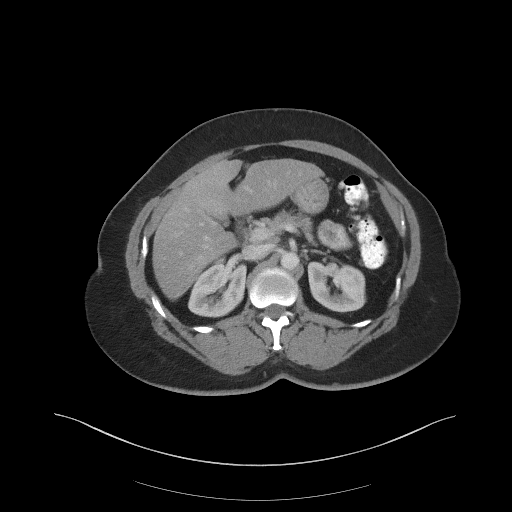
[im 81/101  soft-tissue]
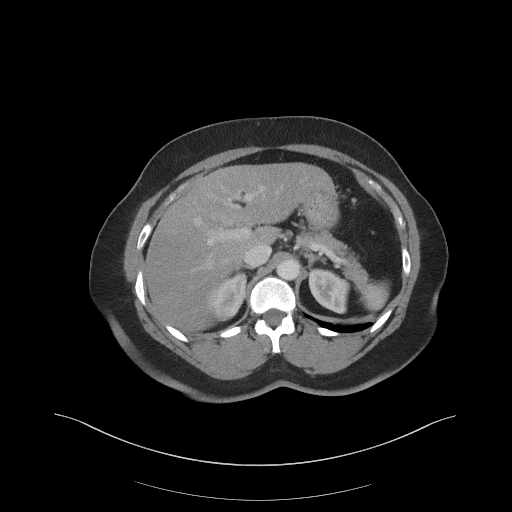
[im 86/101  soft-tissue]
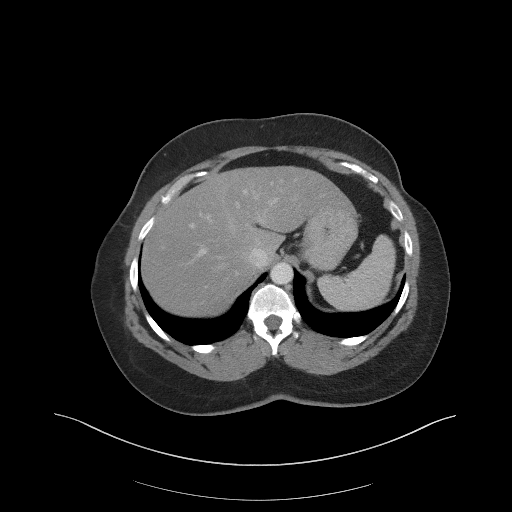
[im 96/101  soft-tissue]
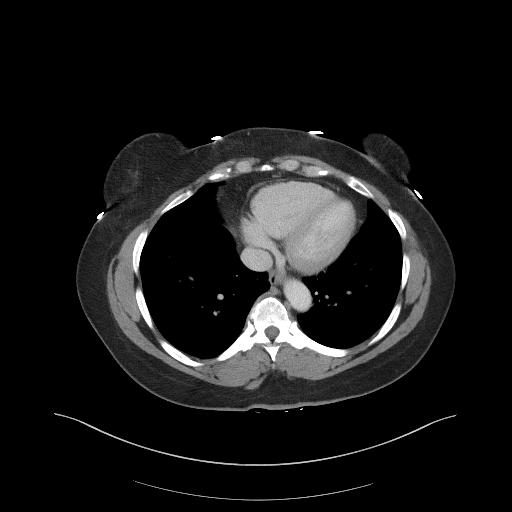

[Series 5: coronal st · coronal · 1.01mm/px · 3 of 115 slices shown]
[im 39/115  soft-tissue]
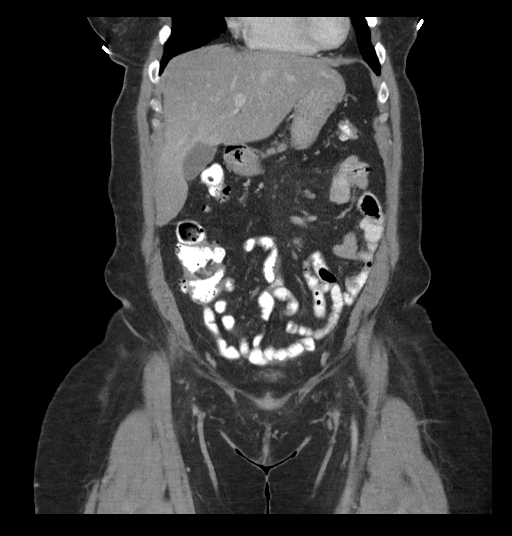
[im 51/115  soft-tissue]
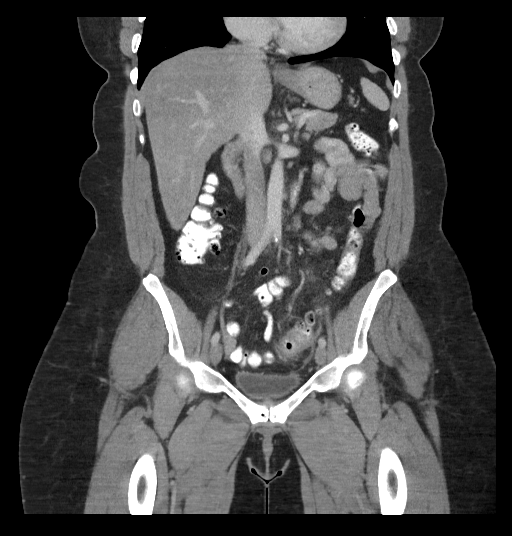
[im 64/115  soft-tissue]
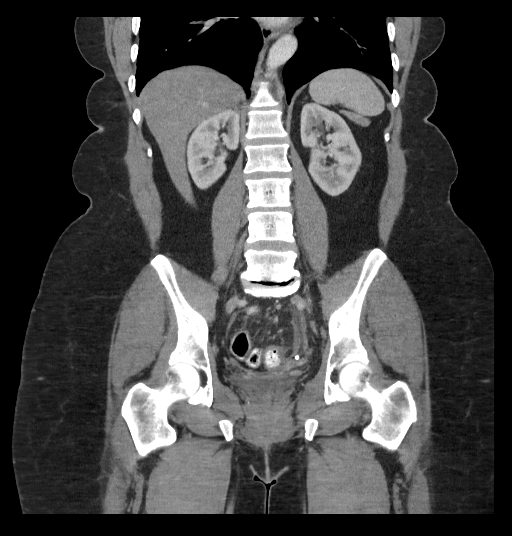

[17 of 46 positions shown; findings below may reference images not displayed]

FINDINGS: Lower chest: No acute abnormality.  Mild cardiomegaly noted.

Hepatobiliary: Question mild hepatic steatosis. No other hepatic or
gallbladder abnormality identified. There is no evidence of biliary
dilatation.

Pancreas: Unremarkable

Spleen: Unremarkable

Adrenals/Urinary Tract: The kidneys, adrenal glands and bladder are
unremarkable.

Stomach/Bowel: Focal wall thickening of the proximal sigmoid colon
is noted with adjacent inflammation likely representing
diverticulitis. There is no evidence of pneumoperitoneum or abscess.
A trace amount of free fluid is again noted.

There is no evidence of bowel obstruction.  The appendix is normal.

Vascular/Lymphatic: No enlarged lymph nodes or abdominal aortic
aneurysm identified.

Reproductive: Status post hysterectomy. No adnexal masses.

Other: None

Musculoskeletal: Moderate degenerative disc disease at L5-S1 noted.
IMPRESSION: Proximal sigmoid colon wall thickening with adjacent inflammation
likely representing acute diverticulitis. No evidence of
pneumoperitoneum or abscess. Clinical follow-up recommended.

Question mild hepatic steatosis.

## 2017-04-23 ENCOUNTER — Ambulatory Visit (HOSPITAL_BASED_OUTPATIENT_CLINIC_OR_DEPARTMENT_OTHER)
Admission: RE | Admit: 2017-04-23 | Discharge: 2017-04-23 | Disposition: A | Discharge status: Home / Self Care | Payer: BC Managed Care – PPO | Source: Ambulatory Visit | Attending: Family Medicine | Admitting: Family Medicine

## 2017-04-23 DIAGNOSIS — Z1231 Encounter for screening mammogram for malignant neoplasm of breast: Secondary | ICD-10-CM | POA: Diagnosis present

## 2017-04-30 ENCOUNTER — Ambulatory Visit: Payer: BC Managed Care – PPO | Admitting: Podiatry

## 2017-05-01 ENCOUNTER — Telehealth: Payer: Self-pay | Admitting: Gastroenterology

## 2017-05-01 NOTE — Telephone Encounter (Signed)
Left message on machine to call back  

## 2017-05-02 ENCOUNTER — Telehealth: Payer: Self-pay

## 2017-05-02 MED ORDER — VSL#3 PO CAPS: 1.0000 | ORAL_CAPSULE | Freq: Two times a day (BID) | ORAL | 3 refills | Status: DC

## 2017-05-02 NOTE — Telephone Encounter (Signed)
I have left a message for patient that VSL#3 was actually sent to her pharmacy on 04/15/17. Pharmacy may have placed rx on hold if she has not already picked up. She can call pharmacy and ask them to fill. She may call back if they give her a problem or if this is not what she needs.
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
## 2017-05-07 ENCOUNTER — Ambulatory Visit (INDEPENDENT_AMBULATORY_CARE_PROVIDER_SITE_OTHER): Payer: BC Managed Care – PPO | Admitting: Internal Medicine

## 2017-05-07 VITALS — BP 128/74 | HR 75 | Wt 237.0 lb

## 2017-05-07 DIAGNOSIS — E0500 Thyrotoxicosis with diffuse goiter without thyrotoxic crisis or storm: Secondary | ICD-10-CM | POA: Diagnosis not present

## 2017-05-07 LAB — T4, FREE: Free T4: 0.76 ng/dL (ref 0.60–1.60)

## 2017-05-07 LAB — TSH: TSH: 0.9 u[IU]/mL (ref 0.35–4.50)

## 2017-05-07 LAB — T3, FREE: T3, Free: 2.9 pg/mL (ref 2.3–4.2)

## 2017-05-07 MED ORDER — ATENOLOL 25 MG PO TABS: 12.5000 mg | ORAL_TABLET | Freq: Every day | ORAL | 3 refills | Status: DC

## 2017-05-07 NOTE — Progress Notes (Addendum)
Patient ID: Emily Osborne, female   DOB: Nov 21, 1960, 56 y.o.   MRN: [id number]    HPI  Emily Osborne is a very pleasant 56 y.o.-year-old female, returning for f/u for Graves' disease. Last visit 7 months ago.  She has repeated episodes of diverticulitis before and after last visit. She was again admitted and had surgery (sigmoidectomy) since last visit. She feels better but still some bloating >> will be checked for H pylori. She also started a probiotic (VSL #3). She sees Dr. Havery Moros.  I reviewed pt's thyroid tests: Lab Results  Component Value Date   TSH 1.11 10/05/2016   TSH 1.06 06/28/2016   TSH 1.30 04/27/2016   TSH 0.90 03/23/2016   TSH 0.01 (L) 12/27/2015   TSH 0.01 (L) 12/14/2015   TSH 0.021 (L) 12/07/2015   TSH 0.11 (L) 10/31/2015   TSH 0.87 09/07/2014   TSH 0.29 (L) 10/28/2013   FREET4 0.78 10/05/2016   FREET4 0.74 06/28/2016   FREET4 0.76 04/27/2016   FREET4 1.61 (H) 12/27/2015   FREET4 1.34 (H) 12/08/2015   FREET4 1.18 11/02/2015   FREET4 0.90 10/28/2013   FREET4 0.8 12/27/2009    Lab Results  Component Value Date   TSI 393 (H) 12/27/2015   We started MMI 5 mg daily >> subsequent TFTs have normalized >> She continues on methimazole 2.5 mg daily now. She tolerates this well.  We also started Atenolol 25 mg 2x a day >> she came off >> restarted on 09/14/2016 as she had intermittent CP >> She continues on atenolol 25 mg daily now. If she stops, she has CP. She had a cardiac cath 10/03/2016 >> no pathology.  She was on Dexilant >> stopped for 3 weeks now to obtain a stool sample. She started to feel more tired since then.  Pt denies: - feeling nodules in neck - hoarseness - dysphagia - choking - SOB with lying down  Pt c/o: - + weight gain - + fatigue - + dizziness  Denies: - heat intolerance - tremors - palpitations - anxiety - hyperdefecation - hair loss  Pt does not have a FH of thyroid ds. (mother, sister). No FH of thyroid cancer. No h/o  radiation tx to head or neck.  No seaweed or kelp. No recent contrast studies. No herbal supplements. No Biotin use. No recent steroids use.   She does have a history of B12 deficiency.   ROS: Constitutional: + weight gain/no weight loss, + fatigue, no subjective hyperthermia, no subjective hypothermia Eyes: no blurry vision, no xerophthalmia ENT: no sore throat, + see HPI Cardiovascular: no CP/no SOB/no palpitations/no leg swelling Respiratory: no cough/no SOB/no wheezing Gastrointestinal: no N/no V/no D/no C/no acid reflux Musculoskeletal: no muscle aches/no joint aches Skin: no rashes, no hair loss Neurological: no tremors/no numbness/no tingling/no dizziness  I reviewed pt's medications, allergies, PMH, social hx, family hx, and changes were documented in the history of present illness. Otherwise, unchanged from my initial visit note.  Past Medical History:  Diagnosis Date  . Allergy   . Anxiety   . Asthma    pt has inhaler  . C. difficile colitis   . Cataract    bil eyes  . Clostridium difficile colitis 12/13/2016  . Diverticulitis   . Dysrhythmia    palpitations  . Edema   . Elevated blood pressure reading without diagnosis of hypertension    "just elevated when I'm in pain" (12/07/2015)  . Fatty liver   . Fibromyalgia   .  Ganglion of joint    right wrist  . GERD (gastroesophageal reflux disease)   . H. pylori infection   . Headache    hx of  . Helicobacter pylori (H. pylori) infection 12/13/2016  . Herpes zoster without mention of complication   . Hyperlipidemia   . Hyperthyroidism   . IBS (irritable bowel syndrome)   . Nontraumatic rupture of Achilles tendon   . Other acute reactions to stress   . Pain in joint, shoulder region   . Pain in joint, upper arm   . Pain in limb   . Personal history of other diseases of digestive system   . Pneumonia    once  . PONV (postoperative nausea and vomiting)   . Routine screening for STI (sexually transmitted  infection) 12/13/2016  . Thyroid disease    hyperthyroidism   Past Surgical History:  Procedure Laterality Date  . ABDOMINAL HYSTERECTOMY    . ACHILLES TENDON REPAIR Right 2005  . BUNIONECTOMY Bilateral    Bunionectomy 1983  . COLON SURGERY    . GANGLION CYST EXCISION Right    wrist  . LEFT HEART CATH AND CORONARY ANGIOGRAPHY N/A 10/03/2016   Procedure: Left Heart Cath and Coronary Angiography;  Surgeon: Burnell Blanks, MD;  Location: [facility];  Service: Cardiovascular;  Laterality: N/A;  . MENISCUS REPAIR Right 04/2014  . TUBAL LIGATION    . wisdomteeth extraction     Social History   Social History  . Marital status: Divorced    Spouse name: N/A  . Number of children: 2  . Years of education: N/A   Occupational History  . Nurse/teacher    Social History Main Topics  . Smoking status: Never Smoker  . Smokeless tobacco: Never Used  . Alcohol use No  . Drug use: No  . Sexual activity: Not Currently    Partners: Male   Other Topics Concern  . Not on file   Social History Narrative   Lives with alone.  She has two daughter.   She works as a Programme researcher, broadcasting/film/video at [employer]--- no   Current Outpatient Prescriptions on File Prior to Visit  Medication Sig Dispense Refill  . acetaminophen (TYLENOL) 500 MG tablet Take 1,000 mg by mouth every 6 (six) hours as needed for moderate pain.     Marland Kitchen albuterol (PROAIR HFA) 108 (90 Base) MCG/ACT inhaler USE 2 PUFFS EVERY 4 HOURS AS NEEDED FOR COUGH, WHEEZE OR SHORTNESS OF BREATH. 8.5 each 2  . albuterol (PROVENTIL) (2.5 MG/3ML) 0.083% nebulizer solution Take 3 mLs (2.5 mg total) by nebulization every 6 (six) hours as needed for wheezing or shortness of breath. 75 mL 3  . ALPRAZolam (XANAX) 0.25 MG tablet Take 1 tablet (0.25 mg total) by mouth 3 (three) times daily as needed. 30 tablet 2  . atenolol (TENORMIN) 25 MG tablet Take 0.5 tablets (12.5 mg total) by mouth daily. Takes 0.5 tablet 30 tablet 0  . Blood Glucose  Monitoring Suppl (FREESTYLE FREEDOM) KIT 1 Device by Does not apply route daily. 1 each 0  . CLINPRO 5000 1.1 % PSTE Take 1 application by mouth See admin instructions. Brush teeth nightly  4  . diclofenac sodium (VOLTAREN) 1 % GEL Apply 2 g topically 2 (two) times daily as needed (pain). For pain    . fexofenadine (ALLEGRA) 180 MG tablet Take 1 tablet (180 mg total) by mouth daily. 90 tablet 3  . Flaxseed, Linseed, (FLAXSEED OIL PO) Take by  mouth.    . fluticasone (FLONASE) 50 MCG/ACT nasal spray PLACE 2 SPRAYS INTO BOTH NOSTRILS DAILY. 16 g 4  . Fluticasone-Salmeterol (ADVAIR DISKUS) 250-50 MCG/DOSE AEPB Inhale 1 puff into the lungs 2 (two) times daily. 60 each 2  . FREESTYLE LITE test strip USE TO CHECK BLOOD SUGAR ONCE A DAY 100 each 0  . Iron Combinations (I.L.X. B-12) ELIX Take 1 each by mouth daily as needed (energy). 1 droplet full    . lidocaine (LIDODERM) 5 % APPLY 1 PATCH ON THE SKIN EVERY 12 HRS REMOVE, DISCARD PATCH WITHIN 12 HRS OR AS DIRECTED 30 patch 0  . methimazole (TAPAZOLE) 5 MG tablet Take 0.5 tablets (2.5 mg total) by mouth daily. (Patient taking differently: Take 2.5 mg by mouth daily. Takes 0.5 tablet) 90 tablet 0  . Probiotic Product (VSL#3) CAPS Take 1 capsule by mouth 2 (two) times daily. 60 capsule 3  . promethazine (PHENERGAN) 25 MG tablet Take 1 tablet (25 mg total) by mouth 4 (four) times daily as needed for nausea or vomiting. 30 tablet 0  . triamcinolone cream (KENALOG) 0.1 % APPLY TO AFFECTED AREA TWICE A DAY (Patient taking differently: APPLY TO AFFECTED AREA TWICE A DAY AS NEEDED FOR RASH) 30 g 0  . valACYclovir (VALTREX) 1000 MG tablet Take 1 tablet (1,000 mg total) by mouth 3 (three) times daily. 30 tablet 5   No current facility-administered medications on file prior to visit.    Allergies  Allergen Reactions  . Aspirin Other (See Comments)    REACTION: anaphylaxis  . Gentamicin Other (See Comments)    Eye drops turned the sclera bright red  . Ibuprofen  Other (See Comments)    REACTION: anaphylaxsis  . Nsaids Other (See Comments)    REACTION: anaphylaxis  . Doxycycline Other (See Comments)    REACTION: severe nausea/vomiting  . Influenza A (H1n1) Monoval Vac Other (See Comments)    REACTION: sick for 3 weeks  . Loratadine Other (See Comments)    Fatigue/weakness  . Metronidazole Other (See Comments)    REACTION: red face/swelling  . Periactin [Cyproheptadine] Other (See Comments)    paranoid   . Ranitidine Hcl Other (See Comments)    REACTION: Lips turned red /peel  . Sulfamethoxazole-Trimethoprim Other (See Comments)    REACTION: face red/peel  . Tramadol Hcl Other (See Comments)    REACTION: paranoid   Family History  Problem Relation Age of Onset  . Hypertension Father   . Diabetes Father   . Heart disease Father   . Kidney disease Father        Died, 32  . Leukemia Brother   . Cancer Brother        myleoblastic anemia  . Hyperlipidemia Mother   . Hypertension Mother        Died, 65  . Thyroid disease Mother        Thyroid surgery  . Pernicious anemia Sister   . Thyroid disease Sister        On thyroid Rx  . Lupus Daughter   . Coronary artery disease Brother   . Hypertension Brother   . Colon cancer Neg Hx   . Esophageal cancer Neg Hx   . Rectal cancer Neg Hx   . Stomach cancer Neg Hx   . Pancreatic cancer Neg Hx    PE: BP 128/74 (BP Location: Left Arm, Patient Position: Sitting)   Pulse 75   Wt 237 lb (107.5 kg)   SpO2 98%  BMI 39.44 kg/m  Wt Readings from Last 3 Encounters:  05/07/17 237 lb (107.5 kg)  04/15/17 236 lb (107 kg)  04/04/17 235 lb (106.6 kg)   Constitutional: overweight, in NAD Eyes: PERRLA, EOMI, no exophthalmos ENT: moist mucous membranes, no thyromegaly, no cervical lymphadenopathy Cardiovascular: RRR, No MRG Respiratory: CTA B Gastrointestinal: abdomen soft, NT, ND, BS+ Musculoskeletal: no deformities, strength intact in all 4 Skin: moist, warm, no rashes Neurological: no  tremor with outstretched hands, DTR normal in all 4  ASSESSMENT: 1. Graves ds.  PLAN:  1. Patient with Graves ds., Initially with few thyrotoxic symptoms: Weight loss, some heat intolerance, tremors, palpitations, all improved after starting methimazole. We were able to decrease the dose of methimazole to 2.5 mg daily. She is tolerating this dose well and has no side effects. - We discussed about the possibility of coming off the methimazole and we will check her TFTs today along with TSI antibodies. Patients with low TSI antibodies are more likely to be able to stay off methimazole. However, she agrees to remain on the low-dose methimazole, if needed. As she needs such a low dose of methimazole, it would not be indicated to proceed with RAI treatment or thyroidectomy.  - She feels more tired in the last month, and the pump questioning, she started after she stopped Dexilant. It is possible that she started to absorb more of her methimazole after she stopped the PPI and she may need to stop the MMI now.  - We also discussed about her atenolol. She is on the low dose, 12.5 mg daily, for chest pain, and not for tachycardia caused by Graves' disease. She develops chest pain as soon as she stops the medication and this is relieved within 30 minutes of taking a dose of atenolol. It is unclear whether there is not a placebo component, also. We discussed about the need to continue this medication for now and I refilled it today. Of note, she had a recent cardiac catheterization that did not show obstructions. - We discussed about having her come back in 6 months for another set of TFTs and then return to see me in a year.  Needs refills MMI.  Component     Latest Ref Rng & Units 05/07/2017  TSH     0.35 - 4.50 uIU/mL 0.90  Triiodothyronine,Free,Serum     2.3 - 4.2 pg/mL 2.9  T4,Free(Direct)     0.60 - 1.60 ng/dL 0.76  TSI     <140 % baseline <89   TFTs and TSI normal!  - time spent with the  patient: 30 min, of which >50% was spent in obtaining information about her symptoms, reviewing her previous labs, evaluations, and treatments, counseling her about her condition (please see the discussed topics above), and developing a plan to further investigate and treat it; she had a number of questions which I addressed.   Philemon Kingdom, MD PhD [facility]

## 2017-05-07 NOTE — Patient Instructions (Addendum)
Please stop at the lab.  Please continue methimazole 2.5 mg daily for now.  Please come back for a follow-up appointment in 1 year, but will need repeat labs in 6 months.

## 2017-05-09 ENCOUNTER — Encounter: Payer: Self-pay | Admitting: Internal Medicine

## 2017-05-09 ENCOUNTER — Other Ambulatory Visit: Payer: BC Managed Care – PPO

## 2017-05-09 DIAGNOSIS — R159 Full incontinence of feces: Secondary | ICD-10-CM

## 2017-05-09 DIAGNOSIS — R194 Change in bowel habit: Secondary | ICD-10-CM

## 2017-05-09 DIAGNOSIS — Z8619 Personal history of other infectious and parasitic diseases: Secondary | ICD-10-CM

## 2017-05-09 DIAGNOSIS — R140 Abdominal distension (gaseous): Secondary | ICD-10-CM

## 2017-05-09 MED ORDER — METHIMAZOLE 5 MG PO TABS: 2.5000 mg | ORAL_TABLET | Freq: Every day | ORAL | 1 refills | Status: DC

## 2017-05-10 LAB — HELICOBACTER PYLORI  SPECIAL ANTIGEN
MICRO NUMBER: 81041639
RESULT: DETECTED — AB
SPECIMEN QUALITY: ADEQUATE

## 2017-05-11 ENCOUNTER — Encounter: Payer: Self-pay | Admitting: Family Medicine

## 2017-05-13 ENCOUNTER — Encounter: Payer: Self-pay | Admitting: Gastroenterology

## 2017-05-13 ENCOUNTER — Encounter: Payer: Self-pay | Admitting: Internal Medicine

## 2017-05-13 LAB — THYROID STIMULATING IMMUNOGLOBULIN: TSI: <89 % baseline (ref <140)

## 2017-05-24 ENCOUNTER — Ambulatory Visit (AMBULATORY_SURGERY_CENTER): Payer: Self-pay | Admitting: *Deleted

## 2017-05-24 VITALS — Ht 65.5 in | Wt 238.0 lb

## 2017-05-24 DIAGNOSIS — A048 Other specified bacterial intestinal infections: Secondary | ICD-10-CM

## 2017-05-24 NOTE — Progress Notes (Signed)
No allergies to eggs or soy. No problems with anesthesia.  Pt given Emmi instructions for EGD  No oxygen use  No diet drug use  

## 2017-05-27 ENCOUNTER — Encounter: Payer: Self-pay | Admitting: Gastroenterology

## 2017-06-04 ENCOUNTER — Encounter: Payer: Self-pay | Admitting: Gastroenterology

## 2017-06-04 ENCOUNTER — Ambulatory Visit (AMBULATORY_SURGERY_CENTER): Payer: BC Managed Care – PPO | Admitting: Gastroenterology

## 2017-06-04 VITALS — BP 140/85 | HR 66 | Temp 98.2°F | Resp 13 | Ht 65.5 in | Wt 238.0 lb

## 2017-06-04 DIAGNOSIS — A048 Other specified bacterial intestinal infections: Secondary | ICD-10-CM

## 2017-06-04 DIAGNOSIS — K2980 Duodenitis without bleeding: Secondary | ICD-10-CM | POA: Diagnosis not present

## 2017-06-04 DIAGNOSIS — K2990 Gastroduodenitis, unspecified, without bleeding: Secondary | ICD-10-CM

## 2017-06-04 DIAGNOSIS — K2970 Gastritis, unspecified, without bleeding: Secondary | ICD-10-CM

## 2017-06-04 MED ORDER — SODIUM CHLORIDE 0.9 % IV SOLN: 4.0000 mg | Freq: Once | INTRAVENOUS | Status: DC

## 2017-06-04 MED ORDER — SODIUM CHLORIDE 0.9 % IV SOLN: 500.0000 mL | INTRAVENOUS | Status: DC

## 2017-06-04 MED ORDER — ONDANSETRON HCL 4 MG/2ML IJ SOLN
4.0000 mg | Freq: Once | INTRAMUSCULAR | Status: AC
  Administered 2017-06-04: 4 mg via INTRAVENOUS

## 2017-06-04 NOTE — Progress Notes (Signed)
1440 pt. C/o cramping and being sick on stomach, Dr. Havery Moros @ bedside and ordered zofran 4mg  I.v. X1.  1505 zofran divided 1/2 and diluted in 2 10 cc syringes and administered slowly over 20 minutes pt.stated that she feels better and pain level decreased.1510 water given to pt. Tolerating without difficulty,pt. Stated"i think I am okay now".1512 pt. Stated pain level "Gone", "i feel like me now". 1520. Dr. Havery Moros seen pt. Prior to discharge,order received to discharge pt.

## 2017-06-04 NOTE — Progress Notes (Signed)
Called to room to assist during endoscopic procedure.  Patient ID and intended procedure confirmed with present staff. Received instructions for my participation in the procedure from the performing physician.  

## 2017-06-04 NOTE — Patient Instructions (Signed)
YOU HAD AN ENDOSCOPIC PROCEDURE TODAY AT [facility]:   Refer to the procedure report that was given to you for any specific questions about what was found during the examination.  If the procedure report does not answer your questions, please call your gastroenterologist to clarify.  If you requested that your care partner not be given the details of your procedure findings, then the procedure report has been included in a sealed envelope for you to review at your convenience later.  YOU SHOULD EXPECT: Some feelings of bloating in the abdomen. Passage of more gas than usual.  Walking can help get rid of the air that was put into your GI tract during the procedure and reduce the bloating. If you had a lower endoscopy (such as a colonoscopy or flexible sigmoidoscopy) you may notice spotting of blood in your stool or on the toilet paper. If you underwent a bowel prep for your procedure, you may not have a normal bowel movement for a few days.  Please Note:  You might notice some irritation and congestion in your nose or some drainage.  This is from the oxygen used during your procedure.  There is no need for concern and it should clear up in a day or so.  SYMPTOMS TO REPORT IMMEDIATELY:     Following upper endoscopy (EGD)  Vomiting of blood or coffee ground material  New chest pain or pain under the shoulder blades  Painful or persistently difficult swallowing  New shortness of breath  Fever of 100F or higher  Black, tarry-looking stools  For urgent or emergent issues, a gastroenterologist can be reached at any hour by calling [phone].   DIET:  We do recommend a small meal at first, but then you may proceed to your regular diet.  Drink plenty of fluids but you should avoid alcoholic beverages for 24 hours.  ACTIVITY:  You should plan to take it easy for the rest of today and you should NOT DRIVE or use heavy machinery until tomorrow (because of the sedation medicines  used during the test).    FOLLOW UP: Our staff will call the number listed on your records the next business day following your procedure to check on you and address any questions or concerns that you may have regarding the information given to you following your procedure. If we do not reach you, we will leave a message.  However, if you are feeling well and you are not experiencing any problems, there is no need to return our call.  We will assume that you have returned to your regular daily activities without incident.  If any biopsies were taken you will be contacted by phone or by letter within the next 1-3 weeks.  Please call us at [phone] if you have not heard about the biopsies in 3 weeks.    SIGNATURES/CONFIDENTIALITY: You and/or your care partner have signed paperwork which will be entered into your electronic medical record.  These signatures attest to the fact that that the information above on your After Visit Summary has been reviewed and is understood.  Full responsibility of the confidentiality of this discharge information lies with you and/or your care-partner.   Continue medication information given on gastritis.

## 2017-06-04 NOTE — Progress Notes (Signed)
Spontaneous respirations throughout. VSS. Resting comfortably. To PACU on room air. Report to  RN. 

## 2017-06-04 NOTE — Progress Notes (Signed)
Tissue specimen for culture and sensitivity walked over to [employer]. SM

## 2017-06-04 NOTE — Progress Notes (Signed)
Pt's states no medical or surgical changes since previsit or office visit. maw 

## 2017-06-04 NOTE — Op Note (Signed)
[facility] Patient Name: Emily Osborne Procedure Date: 06/04/2017 2:11 PM MRN: [id number] Endoscopist: Remo Lipps P. Armbruster MD, MD Age: 56 Referring MD:  Date of Birth: 04/10/1961 Gender: Female Account #: [id number] Procedure:                Upper GI endoscopy Indications:              Follow-up of prior Helicobacter pylori - s/p                            treatment x 3 regimens with persistent positive                            stool antigen following therapy, here for EGD for H                            pylori culture and sensitivity per ID                            recommendations Medicines:                Monitored Anesthesia Care Procedure:                Pre-Anesthesia Assessment:                           - Prior to the procedure, a History and Physical                            was performed, and patient medications and                            allergies were reviewed. The patient's tolerance of                            previous anesthesia was also reviewed. The risks                            and benefits of the procedure and the sedation                            options and risks were discussed with the patient.                            All questions were answered, and informed consent                            was obtained. Prior Anticoagulants: The patient has                            taken no previous anticoagulant or antiplatelet                            agents. ASA Grade Assessment: II - A patient with  mild systemic disease. After reviewing the risks                            and benefits, the patient was deemed in                            satisfactory condition to undergo the procedure.                           After obtaining informed consent, the endoscope was                            passed under direct vision. Throughout the                            procedure, the patient's blood pressure, pulse, and                             oxygen saturations were monitored continuously. The                            Endoscope was introduced through the mouth, and                            advanced to the second part of duodenum. The upper                            GI endoscopy was accomplished without difficulty.                            The patient tolerated the procedure well. Scope In: Scope Out: Findings:                 Esophagogastric landmarks were identified: the                            Z-line was found at 35 cm, the gastroesophageal                            junction was found at 35 cm and the upper extent of                            the gastric folds was found at 37 cm from the                            incisors.                           A 2 cm hiatal hernia was present.                           The exam of the esophagus was otherwise normal.  Diffuse mild inflammation characterized by erythema                            was found in the gastric fundus and in the gastric                            body. Biopsies were taken with a cold forceps for                            Helicobacter pylori testing from the antrum,                            incisura, body, and fundus.                           The exam of the stomach was otherwise normal.                           The ampulla was prominent. I suspect this is a                            normal variant, but biopsies were taken with a cold                            forceps for histology.                           The exam of the duodenum was otherwise normal. Complications:            No immediate complications. Estimated blood loss:                            Minimal. Estimated Blood Loss:     Estimated blood loss was minimal. Impression:               - Esophagogastric landmarks identified.                           - 2 cm hiatal hernia.                           - Esophagus otherwise normal                            - Gastritis. Biopsied to obtain culture and                            sensitivity for H pylori                           - Prominent ampulla, suspect normal variant.                            Biopsied.                           -  Normal duodenum Recommendation:           - Patient has a contact number available for                            emergencies. The signs and symptoms of potential                            delayed complications were discussed with the                            patient. Return to normal activities tomorrow.                            Written discharge instructions were provided to the                            patient.                           - Resume previous diet.                           - Continue present medications.                           - Await pathology results with further                            recommendations for H pylori therapy Remo Lipps P. Armbruster MD, MD 06/04/2017 2:40:35 PM This report has been signed electronically.

## 2017-06-05 ENCOUNTER — Telehealth: Payer: Self-pay | Admitting: *Deleted

## 2017-06-05 NOTE — Telephone Encounter (Signed)
  Follow up Call-  Call back number 06/04/2017 05/17/2016 09/30/2015  Post procedure Call Back phone  # [phone] cell [phone] cell [phone]  Permission to leave phone message Yes Yes -  Some recent data might be hidden     Patient questions:  Do you have a fever, pain , or abdominal swelling? No. Pain Score  0 *  Have you tolerated food without any problems? Yes.    Have you been able to return to your normal activities? Yes.    Do you have any questions about your discharge instructions: Diet   No. Medications  No. Follow up visit  No.  Do you have questions or concerns about your Care? No.  Actions: * If pain score is 4 or above: No action needed, pain <4.  Patient states she felt bad last night, "felt like a had a cold", but she states she read that it may be the oxygen. She states she feels better today. Encouraged patient to call us back if anything changes. She understands.

## 2017-06-09 LAB — AEROBIC/ANAEROBIC CULTURE W GRAM STAIN (SURGICAL/DEEP WOUND)

## 2017-06-09 LAB — AEROBIC/ANAEROBIC CULTURE (SURGICAL/DEEP WOUND)

## 2017-06-10 ENCOUNTER — Telehealth: Payer: Self-pay | Admitting: Family Medicine

## 2017-06-10 NOTE — Telephone Encounter (Signed)
Pt states needs HEP A & TYPHOID vaccines for a trip to [place]. Pt states please call those in to [facility] on [address] ph [phone]. Fax [phone].

## 2017-06-11 ENCOUNTER — Encounter: Payer: Self-pay | Admitting: Family Medicine

## 2017-06-11 ENCOUNTER — Other Ambulatory Visit: Payer: Self-pay | Admitting: Family Medicine

## 2017-06-11 DIAGNOSIS — Z298 Encounter for other specified prophylactic measures: Secondary | ICD-10-CM

## 2017-06-11 MED ORDER — CHLOROQUINE PHOSPHATE 500 MG PO TABS: 500.0000 mg | ORAL_TABLET | Freq: Every day | ORAL | 0 refills | Status: DC

## 2017-06-12 ENCOUNTER — Encounter: Payer: Self-pay | Admitting: Family Medicine

## 2017-06-13 ENCOUNTER — Encounter: Payer: Self-pay | Admitting: Family Medicine

## 2017-06-13 NOTE — Telephone Encounter (Signed)
Returned Pt's call, no answer and unable to leave voicemail.

## 2017-06-14 NOTE — Telephone Encounter (Signed)
malarone 250 mg 1 po qd  Start 1-2 days before leaving and d/c 7 days after

## 2017-06-17 ENCOUNTER — Other Ambulatory Visit: Payer: Self-pay | Admitting: Family Medicine

## 2017-06-17 ENCOUNTER — Other Ambulatory Visit: Payer: Self-pay | Admitting: *Deleted

## 2017-06-17 ENCOUNTER — Encounter: Payer: Self-pay | Admitting: Family Medicine

## 2017-06-17 DIAGNOSIS — Z23 Encounter for immunization: Secondary | ICD-10-CM

## 2017-06-17 MED ORDER — TYPHOID VACCINE PO CPDR: 1.0000 | DELAYED_RELEASE_CAPSULE | ORAL | 0 refills | Status: DC

## 2017-06-17 MED ORDER — TYPHOID VI POLYSACCHARIDE VACC 25 MCG/0.5ML IM SOLN: 0.5000 mL | Freq: Once | INTRAMUSCULAR | 0 refills | Status: AC

## 2017-06-17 NOTE — Telephone Encounter (Signed)
Patient scheduled with nurse for 06/20/17

## 2017-06-18 ENCOUNTER — Encounter: Payer: Self-pay | Admitting: Infectious Disease

## 2017-06-18 ENCOUNTER — Ambulatory Visit (INDEPENDENT_AMBULATORY_CARE_PROVIDER_SITE_OTHER): Payer: BC Managed Care – PPO | Admitting: Infectious Disease

## 2017-06-18 ENCOUNTER — Telehealth: Payer: Self-pay | Admitting: Gastroenterology

## 2017-06-18 VITALS — BP 132/85 | HR 70 | Temp 98.3°F | Wt 232.0 lb

## 2017-06-18 DIAGNOSIS — Z889 Allergy status to unspecified drugs, medicaments and biological substances status: Secondary | ICD-10-CM | POA: Diagnosis not present

## 2017-06-18 DIAGNOSIS — Z9049 Acquired absence of other specified parts of digestive tract: Secondary | ICD-10-CM | POA: Diagnosis not present

## 2017-06-18 DIAGNOSIS — A0472 Enterocolitis due to Clostridium difficile, not specified as recurrent: Secondary | ICD-10-CM

## 2017-06-18 DIAGNOSIS — K5792 Diverticulitis of intestine, part unspecified, without perforation or abscess without bleeding: Secondary | ICD-10-CM

## 2017-06-18 DIAGNOSIS — A048 Other specified bacterial intestinal infections: Secondary | ICD-10-CM | POA: Diagnosis not present

## 2017-06-18 HISTORY — DX: Acquired absence of other specified parts of digestive tract: Z90.49

## 2017-06-18 HISTORY — DX: Allergy status to unspecified drugs, medicaments and biological substances: Z88.9

## 2017-06-18 MED ORDER — LEVOFLOXACIN 500 MG PO TABS: 500.0000 mg | ORAL_TABLET | Freq: Every day | ORAL | 0 refills | Status: AC

## 2017-06-18 MED ORDER — NITAZOXANIDE 500 MG PO TABS: 500.0000 mg | ORAL_TABLET | Freq: Two times a day (BID) | ORAL | 0 refills | Status: DC

## 2017-06-18 MED ORDER — DOXYCYCLINE HYCLATE 100 MG PO TABS: 100.0000 mg | ORAL_TABLET | Freq: Two times a day (BID) | ORAL | 0 refills | Status: AC

## 2017-06-18 MED ORDER — OMEPRAZOLE 40 MG PO CPDR: 40.0000 mg | DELAYED_RELEASE_CAPSULE | Freq: Every day | ORAL | 0 refills | Status: DC

## 2017-06-18 MED ORDER — ONDANSETRON HCL 4 MG PO TABS: ORAL_TABLET | ORAL | 1 refills | Status: DC

## 2017-06-18 MED FILL — DOXYCYCLINE HYCLATE 100 MG: 100 | 14 days supply | Qty: 28 | Fill #0

## 2017-06-18 MED FILL — levoFLOXacin 500 MG TABS: 500 | 14 days supply | Qty: 14 | Fill #0

## 2017-06-18 MED FILL — OMEPRAZOLE DR 40 MG CAPSULE: 40 | 28 days supply | Qty: 28 | Fill #0

## 2017-06-18 MED FILL — ONDANSETRON HCL 4 MG TABLET: 4 | 15 days supply | Qty: 60 | Fill #0

## 2017-06-18 MED FILL — ALINIA 500 MG TABLET: 500 | 14 days supply | Qty: 28 | Fill #0

## 2017-06-18 NOTE — Telephone Encounter (Signed)
Called patient back, let her know about the results and recommendations. She will contact ID to make follow up appointment.

## 2017-06-18 NOTE — Progress Notes (Signed)
[place]: re-evaluation of persistent refractory H pylori  Subjective:    Patient ID: Emily Osborne, female    DOB: 1961/07/29, 56 y.o.   MRN: [id number]  HPI  56 year old lady with hx of recurrent diverticulitis and flare of severe clostridium difficile colitis in February of 2018 while on Augmentin to treat diverticulitis. She has had persistent H pylori antigen + in stool despite two course of antibiotic treatment. First she had PPI + clarithro + amox, then she had PPI + levaquin and amoxicillin. She has had rxn to metronidazole with her face turning red and swelling up after only one dose. She states she does not drink etoh and had not had any when this happened. She ha mx other allergies and intolerances to antibiotics.  She is scheduled for partial colectomy in early June 2018.   WE tried PPI + Rifabutin and Amoxicillin x 14 days but this DID NOT eradicate her H pylori which was ID on stool antigen and also on endoscopy with Dr. Duanne Guess trying to sent for culture though H pylori itself not isolated for resistance testing.  [place], [place] Pharm D reviewed the case extensively and found [place] trial with high efficacy of BID nitazoxanide + doxycycline 100 mg bid, Levaquin 529m daily and high dose omeprazole 456mbid.  We will try for this regimen and also ask her to have her boyfriend tested/treated as well as her daughter if possible since they are 2 close contacts and I am worried the boyfriend in particular could be colonized and transmitting H pylori back to her as well.    Past Medical History:  Diagnosis Date  . Allergy   . Anxiety   . Asthma    pt has inhaler  . C. difficile colitis   . Cataract    bil eyes  . Clostridium difficile colitis 12/13/2016  . Diverticulitis   . Dysrhythmia    palpitations  . Edema   . Elevated blood pressure reading without diagnosis of hypertension    "just elevated when I'm in pain" (12/07/2015)  . Fatty liver   . Fibromyalgia   .  Ganglion of joint    right wrist  . GERD (gastroesophageal reflux disease)   . H. pylori infection   . Headache    hx of  . Helicobacter pylori (H. pylori) infection 12/13/2016  . Herpes zoster without mention of complication   . Hyperlipidemia   . Hyperthyroidism   . IBS (irritable bowel syndrome)   . Multiple drug allergies 06/18/2017  . Nontraumatic rupture of Achilles tendon   . Other acute reactions to stress   . Pain in joint, shoulder region   . Pain in joint, upper arm   . Pain in limb   . Personal history of other diseases of digestive system   . Pneumonia    once  . PONV (postoperative nausea and vomiting)   . Routine screening for STI (sexually transmitted infection) 12/13/2016  . S/P partial colectomy 06/18/2017  . Thyroid disease    hyperthyroidism    Past Surgical History:  Procedure Laterality Date  . ABDOMINAL HYSTERECTOMY    . ACHILLES TENDON REPAIR Right 2005  . BUNIONECTOMY Bilateral    Bunionectomy 1983  . COLON SURGERY  01/23/2017   6 to 8 inches sigmoid colon removed  . GANGLION CYST EXCISION Right    wrist  . LEFT HEART CATH AND CORONARY ANGIOGRAPHY N/A 10/03/2016   Procedure: Left Heart Cath and Coronary Angiography;  Surgeon:  Burnell Blanks, MD;  Location: [facility];  Service: Cardiovascular;  Laterality: N/A;  . MENISCUS REPAIR Right 04/2014  . TUBAL LIGATION    . wisdomteeth extraction      Family History  Problem Relation Age of Onset  . Hypertension Father   . Diabetes Father   . Heart disease Father   . Kidney disease Father        Died, 97  . Leukemia Brother   . Cancer Brother        myleoblastic anemia  . Hyperlipidemia Mother   . Hypertension Mother        Died, 45  . Thyroid disease Mother        Thyroid surgery  . Pernicious anemia Sister   . Thyroid disease Sister        On thyroid Rx  . Lupus Daughter   . Coronary artery disease Brother   . Hypertension Brother   . Colon cancer Neg Hx   . Esophageal  cancer Neg Hx   . Rectal cancer Neg Hx   . Stomach cancer Neg Hx   . Pancreatic cancer Neg Hx   . Prostate cancer Neg Hx       Social History   Social History  . Marital status: Divorced    Spouse name: N/A  . Number of children: 2  . Years of education: N/A   Occupational History  . Nurse/teacher    Social History Main Topics  . Smoking status: Never Smoker  . Smokeless tobacco: Never Used  . Alcohol use No  . Drug use: No  . Sexual activity: Not Currently    Partners: Male   Other Topics Concern  . Not on file   Social History Narrative   Lives with alone.  She has two daughter.   She works as a Programme researcher, broadcasting/film/video at [employer]--- no    Allergies  Allergen Reactions  . Aspirin Other (See Comments)    REACTION: anaphylaxis  . Gentamicin Other (See Comments)    Eye drops turned the sclera bright red  . Ibuprofen Other (See Comments)    REACTION: anaphylaxsis  . Nsaids Other (See Comments)    REACTION: anaphylaxis  . Doxycycline Other (See Comments)    REACTION: severe nausea/vomiting  . Influenza A (H1n1) [place] Other (See Comments)    REACTION: sick for 3 weeks  . Loratadine Other (See Comments)    Fatigue/weakness  . Metronidazole Other (See Comments)    REACTION: red face/swelling  . Periactin [Cyproheptadine] Other (See Comments)    paranoid   . Ranitidine Hcl Other (See Comments)    REACTION: Lips turned red /peel  . Sulfamethoxazole-Trimethoprim Other (See Comments)    REACTION: face red/peel  . Tramadol Hcl Other (See Comments)    REACTION: paranoid     Current Outpatient Prescriptions:  .  acetaminophen (TYLENOL) 500 MG tablet, Take 1,000 mg by mouth every 6 (six) hours as needed for moderate pain. , Disp: , Rfl:  .  albuterol (PROAIR HFA) 108 (90 Base) MCG/ACT inhaler, USE 2 PUFFS EVERY 4 HOURS AS NEEDED FOR COUGH, WHEEZE OR SHORTNESS OF BREATH., Disp: 8.5 each, Rfl: 2 .  albuterol (PROVENTIL) (2.5 MG/3ML) 0.083% nebulizer  solution, Take 3 mLs (2.5 mg total) by nebulization every 6 (six) hours as needed for wheezing or shortness of breath., Disp: 75 mL, Rfl: 3 .  ALPRAZolam (XANAX) 0.25 MG tablet, Take 1 tablet (0.25 mg total) by mouth 3 (  three) times daily as needed., Disp: 30 tablet, Rfl: 2 .  atenolol (TENORMIN) 25 MG tablet, Take 0.5 tablets (12.5 mg total) by mouth daily. Takes 0.5 tablet, Disp: 45 tablet, Rfl: 3 .  Blood Glucose Monitoring Suppl (FREESTYLE FREEDOM) KIT, 1 Device by Does not apply route daily., Disp: 1 each, Rfl: 0 .  CLINPRO 5000 1.1 % PSTE, Take 1 application by mouth See admin instructions. Brush teeth nightly, Disp: , Rfl: 4 .  dexlansoprazole (DEXILANT) 60 MG capsule, Take 60 mg by mouth daily., Disp: , Rfl:  .  diclofenac sodium (VOLTAREN) 1 % GEL, Apply 2 g topically 2 (two) times daily as needed (pain). For pain, Disp: , Rfl:  .  fexofenadine (ALLEGRA) 180 MG tablet, Take 1 tablet (180 mg total) by mouth daily., Disp: 90 tablet, Rfl: 3 .  Flaxseed, Linseed, (FLAXSEED OIL PO), Take by mouth., Disp: , Rfl:  .  fluticasone (FLONASE) 50 MCG/ACT nasal spray, PLACE 2 SPRAYS INTO BOTH NOSTRILS DAILY., Disp: 16 g, Rfl: 4 .  Fluticasone-Salmeterol (ADVAIR DISKUS) 250-50 MCG/DOSE AEPB, Inhale 1 puff into the lungs 2 (two) times daily., Disp: 60 each, Rfl: 2 .  FREESTYLE LITE test strip, USE TO CHECK BLOOD SUGAR ONCE A DAY, Disp: 100 each, Rfl: 0 .  Iron Combinations (I.L.X. B-12) ELIX, Take 1 each by mouth daily as needed (energy). 1 droplet full, Disp: , Rfl:  .  lidocaine (LIDODERM) 5 %, APPLY 1 PATCH ON THE SKIN EVERY 12 HRS REMOVE, DISCARD PATCH WITHIN 12 HRS OR AS DIRECTED, Disp: 30 patch, Rfl: 0 .  methimazole (TAPAZOLE) 5 MG tablet, Take 0.5 tablets (2.5 mg total) by mouth daily., Disp: 90 tablet, Rfl: 1 .  Probiotic Product (VSL#3) CAPS, Take 1 capsule by mouth 2 (two) times daily., Disp: 60 capsule, Rfl: 3 .  promethazine (PHENERGAN) 25 MG tablet, Take 1 tablet (25 mg total) by mouth 4  (four) times daily as needed for nausea or vomiting., Disp: 30 tablet, Rfl: 0 .  triamcinolone cream (KENALOG) 0.1 %, APPLY TO AFFECTED AREA TWICE A DAY (Patient taking differently: APPLY TO AFFECTED AREA TWICE A DAY AS NEEDED FOR RASH), Disp: 30 g, Rfl: 0 .  valACYclovir (VALTREX) 1000 MG tablet, Take 1 tablet (1,000 mg total) by mouth 3 (three) times daily., Disp: 30 tablet, Rfl: 5 .  doxycycline (VIBRA-TABS) 100 MG tablet, Take 1 tablet (100 mg total) by mouth 2 (two) times daily., Disp: 28 tablet, Rfl: 0 .  levofloxacin (LEVAQUIN) 500 MG tablet, Take 1 tablet (500 mg total) by mouth daily., Disp: 14 tablet, Rfl: 0 .  nitazoxanide (ALINIA) 500 MG tablet, Take 1 tablet (500 mg total) by mouth 2 (two) times daily with a meal., Disp: 28 tablet, Rfl: 0 .  omeprazole (PRILOSEC) 40 MG capsule, Take 1 capsule (40 mg total) by mouth daily., Disp: 28 capsule, Rfl: 0 .  ondansetron (ZOFRAN) 4 MG tablet, Take an hour before antibiotics TWICE daily, and can take another 1-2 doses if needed for nausea prn, Disp: 60 tablet, Rfl: 1   Review of Systems  Constitutional: Negative for chills, diaphoresis and fever.  HENT: Negative for congestion, hearing loss, sore throat and tinnitus.   Respiratory: Negative for cough, shortness of breath and wheezing.   Cardiovascular: Negative for chest pain, palpitations and leg swelling.  Gastrointestinal: Positive for abdominal pain and nausea. Negative for blood in stool, constipation, diarrhea and vomiting.  Genitourinary: Negative for dysuria, flank pain and hematuria.  Musculoskeletal: Negative for back pain and  myalgias.  Skin: Negative for rash.  Neurological: Negative for dizziness, weakness and headaches.  Hematological: Does not bruise/bleed easily.  Psychiatric/Behavioral: Negative for suicidal ideas. The patient is not nervous/anxious.        Objective:   Physical Exam  Constitutional: She is oriented to person, place, and time. She appears well-developed  and well-nourished. No distress.  HENT:  Head: Normocephalic and atraumatic.  Mouth/Throat: No oropharyngeal exudate.  Eyes: Conjunctivae and EOM are normal. No scleral icterus.  Neck: Normal range of motion. Neck supple.  Cardiovascular: Normal rate and regular rhythm.   Pulmonary/Chest: Effort normal. No respiratory distress. She has no wheezes. She has no rales. She exhibits no tenderness.  Abdominal: Soft. Bowel sounds are normal. She exhibits no distension. There is tenderness in the left lower quadrant.  Musculoskeletal: She exhibits no edema or tenderness.  Neurological: She is alert and oriented to person, place, and time. She exhibits normal muscle tone. Coordination normal.  Skin: Skin is warm and dry. No rash noted. She is not diaphoretic. No erythema. No pallor.  Psychiatric: She has a normal mood and affect. Her behavior is normal. Judgment and thought content normal.          Assessment & Plan:   Refractory H pylori:   We will treat with regimen described above and also premedicate her with zofran  I recommend her boyfriend be treated and her Daughter Nikeisha Klutz ( I will forward to her PCP as well)  Would then test for cure with stool antigen in future when she has been off PPI for at least 2 if not 4 weeks  CDI: vigilance for recurrence with initiation of abx. She will be at risk for this with abx and PPI  But I dont know how to avoid this hopefully we can get through 2 weeks and avoid this  Recurrent diverticulitis: sp sigmoidectomy  I spent greater than 40 minutes with the patient including greater than 50% of time in face to face counsel of the patient and in coordination of her care including explanation of regimen how to take this with premedication with zofran and re how to have her boyfriend tested and treated along with her daughter and while this is being figured out in particular to avoid intimate contact with her boyfriend as much as I hate recommending  this.     Patient ID: Emily Osborne, female    DOB: [birth year], 56 y.o.   MRN: [id number]  HPI  Malayla Granberry   Review of Systems  Constitutional: Positive for appetite change. Negative for chills and fever.  HENT: Negative for congestion and sore throat.   Eyes: Negative for photophobia.  Respiratory: Negative for cough, shortness of breath and wheezing.   Cardiovascular: Negative for chest pain, palpitations and leg swelling.  Gastrointestinal: Positive for abdominal pain. Negative for blood in stool, constipation, diarrhea, nausea and vomiting.  Genitourinary: Negative for dysuria, flank pain and hematuria.  Musculoskeletal: Negative for back pain and myalgias.  Skin: Negative for rash.  Neurological: Negative for dizziness, weakness and headaches.  Hematological: Does not bruise/bleed easily.  Psychiatric/Behavioral: Negative for suicidal ideas.       Objective:   Physical Exam  Constitutional: She is oriented to person, place, and time. She appears well-developed and well-nourished. No distress.  HENT:  Head: Normocephalic and atraumatic.  Mouth/Throat: No oropharyngeal exudate.  Eyes: Conjunctivae and EOM are normal. No scleral icterus.  Neck: Normal range of motion. Neck supple.  Cardiovascular: Normal rate and  regular rhythm.   Pulmonary/Chest: Effort normal. No respiratory distress. She has no wheezes.  Abdominal: She exhibits no distension.  Musculoskeletal: She exhibits no edema or tenderness.  Neurological: She is alert and oriented to person, place, and time. She exhibits normal muscle tone. Coordination normal.  Skin: Skin is warm and dry. No rash noted. She is not diaphoretic. No erythema. No pallor.  Psychiatric: She has a normal mood and affect. Her behavior is normal. Judgment and thought content normal.          Assessment & Plan:

## 2017-06-19 MED ORDER — OMEPRAZOLE 40 MG PO CPDR: 40.0000 mg | DELAYED_RELEASE_CAPSULE | Freq: Two times a day (BID) | ORAL | 0 refills | Status: DC

## 2017-06-19 NOTE — Addendum Note (Signed)
Addended by: Onnie Boer Q on: 06/19/2017 11:14 AM   Modules accepted: Orders

## 2017-06-20 ENCOUNTER — Ambulatory Visit (INDEPENDENT_AMBULATORY_CARE_PROVIDER_SITE_OTHER): Payer: BC Managed Care – PPO | Admitting: Behavioral Health

## 2017-06-20 DIAGNOSIS — Z23 Encounter for immunization: Secondary | ICD-10-CM | POA: Diagnosis not present

## 2017-06-20 NOTE — Progress Notes (Signed)
Pre visit review using our clinic review tool, if applicable. No additional management support is needed unless otherwise documented below in the visit note.  Patient came in clinic for meningococcal & hepatitis A vaccinations. Both injections were tolerated well. No s/s of a reaction prior to patient leaving the nurse visit.

## 2017-06-23 ENCOUNTER — Encounter: Payer: Self-pay | Admitting: Family Medicine

## 2017-06-25 MED ORDER — ATOVAQUONE-PROGUANIL HCL 250-100 MG PO TABS: 1.0000 | ORAL_TABLET | Freq: Every day | ORAL | 0 refills | Status: DC

## 2017-06-25 MED ORDER — AZITHROMYCIN 250 MG PO TABS: ORAL_TABLET | ORAL | 0 refills | Status: DC

## 2017-06-25 NOTE — Telephone Encounter (Signed)
I gave instructions in 10/24 phone note for malarone--- no one sent it in [facility] to send z pak #1  As directed

## 2017-06-28 ENCOUNTER — Other Ambulatory Visit: Payer: Self-pay | Admitting: Family Medicine

## 2017-07-01 ENCOUNTER — Ambulatory Visit (INDEPENDENT_AMBULATORY_CARE_PROVIDER_SITE_OTHER): Payer: BC Managed Care – PPO

## 2017-07-01 DIAGNOSIS — Z7189 Other specified counseling: Secondary | ICD-10-CM | POA: Diagnosis not present

## 2017-07-01 DIAGNOSIS — Z7184 Encounter for health counseling related to travel: Secondary | ICD-10-CM

## 2017-07-01 DIAGNOSIS — Z23 Encounter for immunization: Secondary | ICD-10-CM

## 2017-07-01 NOTE — Progress Notes (Signed)
Pre visit review using our clinic tool,if applicable. No additional management support is needed unless otherwise documented below in the visit note.   Patient in for Polio and Tetanus vaccinations. Also received Hep B Titer ok'd by Dr. Lizbeth Bark- Chace.   Patient tolerated well. Given Immunization record for travel purposes.

## 2017-07-02 LAB — HEPATITIS B SURFACE ANTIBODY, QUANTITATIVE: Hepatitis B-Post: 14 m[IU]/mL (ref >=10)

## 2017-07-08 ENCOUNTER — Telehealth: Payer: Self-pay | Admitting: Gastroenterology

## 2017-07-08 ENCOUNTER — Telehealth: Payer: Self-pay | Admitting: *Deleted

## 2017-07-08 NOTE — Telephone Encounter (Signed)
Patient left message in triage stating she completed her treatment, wants to know the next step. Per chart review, Dr Tommy Medal wants the patient to have her stool retested for H Pylori at least 2 but preferably 4 weeks after completing treatment. RN returned call, left message asking for the specific end date. Dr Tommy Medal, is the patient supposed to follow up at [facility] for testing? Please advise. Landis Gandy, RN

## 2017-07-08 NOTE — Telephone Encounter (Signed)
Patient finished her treatment 11/14. She leaves 12/12 for 12 days ([place]).

## 2017-07-08 NOTE — Telephone Encounter (Signed)
She can get retested when she gets back. Did her boyfriend +/- daughter get treated too?

## 2017-07-08 NOTE — Telephone Encounter (Signed)
Spoke to patient, after reading Dr. Derek Mound office note, he does want to retest her after her antibiotic treatment. I have asked her to contact their office to find out next step. She understands to let us know if they do not have plans to retest.

## 2017-07-09 ENCOUNTER — Other Ambulatory Visit (INDEPENDENT_AMBULATORY_CARE_PROVIDER_SITE_OTHER): Payer: BC Managed Care – PPO

## 2017-07-09 DIAGNOSIS — E785 Hyperlipidemia, unspecified: Secondary | ICD-10-CM | POA: Diagnosis not present

## 2017-07-09 LAB — LIPID PANEL
Cholesterol: 200 mg/dL (ref 0–200)
HDL: 51.6 mg/dL (ref >39.00)
LDL Cholesterol: 126 mg/dL — ABNORMAL HIGH (ref 0–99)
NONHDL: 147.96
Total CHOL/HDL Ratio: 4
Triglycerides: 108 mg/dL (ref 0.0–149.0)
VLDL: 21.6 mg/dL (ref 0.0–40.0)

## 2017-07-09 LAB — COMPREHENSIVE METABOLIC PANEL
ALK PHOS: 70 U/L (ref 39–117)
ALT: 27 U/L (ref 0–35)
AST: 25 U/L (ref 0–37)
Albumin: 3.8 g/dL (ref 3.5–5.2)
BILIRUBIN TOTAL: 0.5 mg/dL (ref 0.2–1.2)
BUN: 13 mg/dL (ref 6–23)
CO2: 29 mEq/L (ref 19–32)
Calcium: 9.3 mg/dL (ref 8.4–10.5)
Chloride: 106 mEq/L (ref 96–112)
Creatinine, Ser: 0.89 mg/dL (ref 0.40–1.20)
GFR: 83.93 mL/min (ref >60.00)
GLUCOSE: 96 mg/dL (ref 70–99)
Potassium: 4.2 mEq/L (ref 3.5–5.1)
SODIUM: 142 meq/L (ref 135–145)
TOTAL PROTEIN: 6.8 g/dL (ref 6.0–8.3)

## 2017-07-09 NOTE — Telephone Encounter (Signed)
Left message asking these questions.

## 2017-07-09 NOTE — Telephone Encounter (Signed)
thx Michelle! 

## 2017-07-17 ENCOUNTER — Other Ambulatory Visit: Payer: Self-pay | Admitting: *Deleted

## 2017-07-17 DIAGNOSIS — E785 Hyperlipidemia, unspecified: Secondary | ICD-10-CM

## 2017-07-27 ENCOUNTER — Other Ambulatory Visit: Payer: Self-pay | Admitting: Family Medicine

## 2017-08-01 ENCOUNTER — Telehealth: Payer: Self-pay

## 2017-08-01 NOTE — Telephone Encounter (Signed)
PA initiated via Covermymeds; KEY: [id number]. Awaiting determination.

## 2017-08-02 NOTE — Telephone Encounter (Signed)
PA again denied. Pt's plan only covers lidoderm patches if she has one of the following:   -pain associated w/ post-herpetic neuralgia -pain associated w/ diabetic neuropathy -pain associated w/ cancer-related neuropathy (including treatment-related neuropathy)

## 2017-08-22 ENCOUNTER — Ambulatory Visit (HOSPITAL_BASED_OUTPATIENT_CLINIC_OR_DEPARTMENT_OTHER)
Admission: RE | Admit: 2017-08-22 | Discharge: 2017-08-22 | Disposition: A | Discharge status: Home / Self Care | Payer: BC Managed Care – PPO | Source: Ambulatory Visit | Attending: Family Medicine | Admitting: Family Medicine

## 2017-08-22 ENCOUNTER — Ambulatory Visit: Payer: BC Managed Care – PPO | Admitting: Family Medicine

## 2017-08-22 ENCOUNTER — Encounter: Payer: Self-pay | Admitting: Family Medicine

## 2017-08-22 VITALS — BP 110/72 | HR 96 | Temp 99.5°F | Wt 230.4 lb

## 2017-08-22 DIAGNOSIS — R509 Fever, unspecified: Secondary | ICD-10-CM

## 2017-08-22 DIAGNOSIS — Z8709 Personal history of other diseases of the respiratory system: Secondary | ICD-10-CM | POA: Diagnosis not present

## 2017-08-22 DIAGNOSIS — R05 Cough: Secondary | ICD-10-CM

## 2017-08-22 DIAGNOSIS — R059 Cough, unspecified: Secondary | ICD-10-CM

## 2017-08-22 LAB — POCT INFLUENZA A/B
INFLUENZA B, POC: NEGATIVE
Influenza A, POC: NEGATIVE

## 2017-08-22 LAB — POCT RAPID STREP A (OFFICE): RAPID STREP A SCREEN: NEGATIVE

## 2017-08-22 MED ORDER — ALBUTEROL SULFATE (2.5 MG/3ML) 0.083% IN NEBU: 2.5000 mg | INHALATION_SOLUTION | Freq: Four times a day (QID) | RESPIRATORY_TRACT | 3 refills | Status: DC | PRN

## 2017-08-22 NOTE — Patient Instructions (Addendum)
I will check your CBC and will be in touch with your results asap Please rest, drink plenty of fluids If you are getting worse please let me know You may have the flu- please watch your symptoms closely   Use the albuterol as needed for wheezing

## 2017-08-22 NOTE — Progress Notes (Signed)
[place] at [facility] [address] [place], [place] [address] [phone]  Date:  08/22/2017   Name:  Emily Osborne   DOB:  10-Dec-1960   MRN:  [id number]  PCP:  [name], DO    Chief Complaint: Cough (c/o congestion, cough, and low grade temp 99.6. Pt states that she traveled out of the country the end of December to [place]. )   History of Present Illness:  Emily Osborne is a 57 y.o. very pleasant female patient who presents with the following:  She started feeling bad this past Sunday- today is Thursday.  She first noted sx of fatigue and general malaise, body aches She does have myalgias at baseline so she was not sure if her aches were significant at first  Then she noted a cough, hoarse voice, ST On Sunday her left ear and left throat hurt- this is now somewhat better She is bringing up some sputum with cough  She notes a fever today up to about 99.6 this am  She has noted chills  No GI symptoms except for minor nausea - no vomiting or diarrhea  She did have a partial colectomy for diverticulitis in 2018 History of asthma  She also did have C diff last year  She went to [place] for about 10 days, came home on 12/24.   While there she had some diarrhea, took zpack.  However otherwise she did ok while on her trip  Asthma history- She does have albuterol to use as needed  She uses advair daily, and albuterol prn She generally does not need the albuterol except when she is sick Some wheezing with this illness but nothing persistent   Patient Active Problem List   Diagnosis Date Noted  . S/P partial colectomy 06/18/2017  . Multiple drug allergies 06/18/2017  . Colitis 02/05/2017  . Diarrhea 02/05/2017  . Diverticular disease 01/23/2017  . Helicobacter pylori (H. pylori) infection 12/13/2016  . Routine screening for STI (sexually transmitted infection) 12/13/2016  . Clostridium difficile colitis  12/13/2016  . Diverticulitis 10/10/2016  . Lower abdominal pain 09/29/2016  . Palpitations 02/06/2016  . Mild diastolic dysfunction 80/16/5537  . Graves disease 01/02/2016  . Acute diverticulitis 12/07/2015  . Acute bacterial sinusitis 10/04/2015  . History of colonic polyps 11/15/2014  . Left shoulder pain 09/28/2013  . Left-sided weakness 09/16/2013  . Obesity (BMI 30-39.9) 09/02/2013  . Myalgia 06/21/2011  . POSTMENOPAUSAL STATUS 09/07/2010  . PAIN IN JOINT, MULTIPLE SITES 12/27/2009  . LOW BACK PAIN, CHRONIC 12/27/2009  . FATIGUE 12/27/2009  . VAGINITIS 11/22/2009  . DYSPEPSIA&OTHER [facility] DISORDERS FUNCTION STOMACH 09/01/2009  . NAUSEA 08/03/2009  . FLATULENCE-GAS-BLOATING 08/03/2009  . ABDOMINAL PAIN RIGHT UPPER QUADRANT 07/06/2009  . SUPRAPUBIC PAIN 07/06/2009  . GANGLION CYST, HX OF 07/06/2009  . BACK PAIN 05/04/2009  . Hyperlipidemia 09/16/2008  . UNSPECIFIED MYALGIA AND MYOSITIS 09/16/2008  . Precordial pain 07/27/2008  . GERD 06/08/2008  . HYPERGLYCEMIA, FASTING 06/08/2008  . HELICOBACTER PYLORI GASTRITIS, HX OF 04/05/2008  . OTHER ACUTE REACTIONS TO STRESS 03/16/2008  . ABDOMINAL PAIN, EPIGASTRIC 03/16/2008  . SHOULDER PAIN, RIGHT 10/01/2007  . ELBOW PAIN, RIGHT 10/01/2007  . ELEVATED BLOOD PRESSURE 04/29/2007  . Pain in limb 04/14/2007  . DEPENDENT EDEMA, RIGHT LEG 04/14/2007  . SHINGLES 04/08/2007  . Asthma 03/18/2007  . GANGLION CYST, WRIST, RIGHT 03/18/2007  . BUNIONECTOMY, HX OF 03/18/2007    Past Medical History:  Diagnosis Date  . Allergy   . Anxiety   . Asthma    pt has inhaler  . C. difficile colitis   . Cataract    bil eyes  . Clostridium difficile colitis 12/13/2016  . Diverticulitis   . Dysrhythmia    palpitations  . Edema   . Elevated blood pressure reading without diagnosis of hypertension    "just elevated when I'm in pain" (12/07/2015)  . Fatty liver   . Fibromyalgia   . Ganglion of joint    right wrist  . GERD (gastroesophageal  reflux disease)   . H. pylori infection   . Headache    hx of  . Helicobacter pylori (H. pylori) infection 12/13/2016  . Herpes zoster without mention of complication   . Hyperlipidemia   . Hyperthyroidism   . IBS (irritable bowel syndrome)   . Multiple drug allergies 06/18/2017  . Nontraumatic rupture of Achilles tendon   . Other acute reactions to stress   . Pain in joint, shoulder region   . Pain in joint, upper arm   . Pain in limb   . Personal history of other diseases of digestive system   . Pneumonia    once  . PONV (postoperative nausea and vomiting)   . Routine screening for STI (sexually transmitted infection) 12/13/2016  . S/P partial colectomy 06/18/2017  . Thyroid disease    hyperthyroidism    Past Surgical History:  Procedure Laterality Date  . ABDOMINAL HYSTERECTOMY    . ACHILLES TENDON REPAIR Right 2005  . BUNIONECTOMY Bilateral    Bunionectomy 1983  . COLON SURGERY  01/23/2017   6 to 8 inches sigmoid colon removed  . GANGLION CYST EXCISION Right    wrist  . LEFT HEART CATH AND CORONARY ANGIOGRAPHY N/A 10/03/2016   Procedure: Left Heart Cath and Coronary Angiography;  Surgeon: Burnell Blanks, MD;  Location: [facility];  Service: Cardiovascular;  Laterality: N/A;  . MENISCUS REPAIR Right 04/2014  . TUBAL LIGATION    . wisdomteeth extraction      Social History   Tobacco Use  . Smoking status: Never Smoker  . Smokeless tobacco: Never Used  Substance Use Topics  . Alcohol use: No    Alcohol/week: 0.0 oz  . Drug use: No    Family History  Problem Relation Age of Onset  . Hypertension Father   . Diabetes Father   . Heart disease Father   . Kidney disease Father        Died, 17  . Leukemia Brother   . Cancer Brother        myleoblastic anemia  . Hyperlipidemia Mother   . Hypertension Mother        Died, 61  . Thyroid disease Mother        Thyroid surgery  . Pernicious anemia Sister   . Thyroid disease Sister        On thyroid  Rx  . Lupus Daughter   . Coronary artery disease Brother   . Hypertension Brother   . Colon cancer Neg Hx   . Esophageal cancer Neg Hx   . Rectal cancer Neg Hx   . Stomach cancer Neg Hx   . Pancreatic cancer Neg Hx   . Prostate cancer Neg Hx     Allergies  Allergen Reactions  . Aspirin Other (See Comments)    REACTION: anaphylaxis  . Gentamicin Other (See Comments)    Eye drops turned the sclera bright red  .  Ibuprofen Other (See Comments)    REACTION: anaphylaxsis  . Nsaids Other (See Comments)    REACTION: anaphylaxis  . Doxycycline Other (See Comments)    REACTION: severe nausea/vomiting  . Influenza A (H1n1) [place] Vac Other (See Comments)    REACTION: sick for 3 weeks  . Loratadine Other (See Comments)    Fatigue/weakness  . Metronidazole Other (See Comments)    REACTION: red face/swelling  . Periactin [Cyproheptadine] Other (See Comments)    paranoid   . Ranitidine Hcl Other (See Comments)    REACTION: Lips turned red /peel  . Sulfamethoxazole-Trimethoprim Other (See Comments)    REACTION: face red/peel  . Tramadol Hcl Other (See Comments)    REACTION: paranoid    Medication list has been reviewed and updated.  Current Outpatient Medications on File Prior to Visit  Medication Sig Dispense Refill  . acetaminophen (TYLENOL) 500 MG tablet Take 1,000 mg by mouth every 6 (six) hours as needed for moderate pain.     Marland Kitchen albuterol (PROAIR HFA) 108 (90 Base) MCG/ACT inhaler USE 2 PUFFS EVERY 4 HOURS AS NEEDED FOR COUGH, WHEEZE OR SHORTNESS OF BREATH. 8.5 each 2  . albuterol (PROVENTIL) (2.5 MG/3ML) 0.083% nebulizer solution Take 3 mLs (2.5 mg total) by nebulization every 6 (six) hours as needed for wheezing or shortness of breath. 75 mL 3  . ALPRAZolam (XANAX) 0.25 MG tablet Take 1 tablet (0.25 mg total) by mouth 3 (three) times daily as needed. 30 tablet 2  . atenolol (TENORMIN) 25 MG tablet Take 0.5 tablets (12.5 mg total) by mouth daily. Takes 0.5 tablet 45 tablet 3   . atovaquone-proguanil (MALARONE) 250-100 MG TABS tablet Take 1 tablet daily by mouth. 21 tablet 0  . Blood Glucose Monitoring Suppl (FREESTYLE FREEDOM) KIT 1 Device by Does not apply route daily. 1 each 0  . CLINPRO 5000 1.1 % PSTE Take 1 application by mouth See admin instructions. Brush teeth nightly  4  . dexlansoprazole (DEXILANT) 60 MG capsule Take 60 mg by mouth daily.    . diclofenac sodium (VOLTAREN) 1 % GEL Apply 2 g topically 2 (two) times daily as needed (pain). For pain    . fexofenadine (ALLEGRA) 180 MG tablet Take 1 tablet (180 mg total) by mouth daily. 90 tablet 3  . Flaxseed, Linseed, (FLAXSEED OIL PO) Take by mouth.    . fluticasone (FLONASE) 50 MCG/ACT nasal spray PLACE 2 SPRAYS INTO BOTH NOSTRILS DAILY. 16 g 4  . Fluticasone-Salmeterol (ADVAIR DISKUS) 250-50 MCG/DOSE AEPB Inhale 1 puff into the lungs 2 (two) times daily. 60 each 2  . FREESTYLE LITE test strip USE TO CHECK BLOOD SUGAR ONCE A DAY 100 each 0  . Iron Combinations (I.L.X. B-12) ELIX Take 1 each by mouth daily as needed (energy). 1 droplet full    . lidocaine (LIDODERM) 5 % APPLY 1 PATCH ON THE SKIN EVERY 12 HRS REMOVE, DISCARD PATCH WITHIN 12 HRS OR AS DIRECTED 30 patch 0  . lidocaine (LIDODERM) 5 % APPLY 1 PATCH ON THE SKIN EVERY 12 HRS REMOVE, DISCARD PATCH WITHIN 12 HRS OR AS DIRECTED 30 patch 0  . methimazole (TAPAZOLE) 5 MG tablet Take 0.5 tablets (2.5 mg total) by mouth daily. 90 tablet 1  . nitazoxanide (ALINIA) 500 MG tablet Take 1 tablet (500 mg total) by mouth 2 (two) times daily with a meal. 28 tablet 0  . omeprazole (PRILOSEC) 40 MG capsule Take 1 capsule (40 mg total) by mouth 2 (two) times daily. 28 capsule  0  . ondansetron (ZOFRAN) 4 MG tablet Take an hour before antibiotics TWICE daily, and can take another 1-2 doses if needed for nausea prn 60 tablet 1  . Probiotic Product (VSL#3) CAPS Take 1 capsule by mouth 2 (two) times daily. 60 capsule 3  . promethazine (PHENERGAN) 25 MG tablet TAKE 1 TABLET  BY MOUTH 4 TIMES A DAY AS NEEDED FOR NAUSEA OR VOMITING 30 tablet 0  . triamcinolone cream (KENALOG) 0.1 % APPLY TO AFFECTED AREA TWICE A DAY (Patient taking differently: APPLY TO AFFECTED AREA TWICE A DAY AS NEEDED FOR RASH) 30 g 0  . valACYclovir (VALTREX) 1000 MG tablet Take 1 tablet (1,000 mg total) by mouth 3 (three) times daily. 30 tablet 5   No current facility-administered medications on file prior to visit.     Review of Systems:  As per HPI- otherwise negative. No rash Per her knowledge she was not anywhere with Ebola risk while in [place]  Physical Examination: Vitals:   08/22/17 1526  BP: 110/72  Pulse: 96  Temp: 99.5 F (37.5 C)  SpO2: 98%   Vitals:   08/22/17 1526  Weight: 230 lb 6.4 oz (104.5 kg)   Body mass index is 37.76 kg/m. Ideal Body Weight:    GEN: WDWN, NAD, Non-toxic, A & O x 3, obese, looks well HEENT: Atraumatic, Normocephalic. Neck supple. No masses, No LAD.  Bilateral TM wnl, oropharynx normal.  PEERL,EOMI.   Ears and Nose: No external deformity. CV: RRR, No M/G/R. No JVD. No thrill. No extra heart sounds. PULM: CTA B, no wheezes, crackles, rhonchi. No retractions. No resp. distress. No accessory muscle use. ABD: S, NT, ND, +BS. No rebound. No HSM. EXTR: No c/c/e NEURO Normal gait.  PSYCH: Normally interactive. Conversant. Not depressed or anxious appearing.  Calm demeanor.   Dg Chest 2 View  Result Date: 08/22/2017 CLINICAL DATA:  Cough, fever. EXAM: CHEST  2 VIEW COMPARISON:  Radiograph of October 14, 2016. FINDINGS: The heart size and mediastinal contours are within normal limits. Both lungs are clear. No pneumothorax or pleural effusion is noted. The visualized skeletal structures are unremarkable. IMPRESSION: No active cardiopulmonary disease. Electronically Signed   By: Marijo Conception, M.D.   On: 08/22/2017 16:09   Results for orders placed or performed in visit on 08/22/17  POCT Influenza A/B  Result Value Ref Range   Influenza A, POC  Negative Negative   Influenza B, POC Negative Negative  POCT rapid strep A  Result Value Ref Range   Rapid Strep A Screen Negative Negative     Assessment and Plan: Low grade fever - Plan: DG Chest 2 View, POCT Influenza A/B, POCT rapid strep A, CBC  History of asthma - Plan: albuterol (PROVENTIL) (2.5 MG/3ML) 0.083% nebulizer solution  Cough - Plan: albuterol (PROVENTIL) (2.5 MG/3ML) 0.083% nebulizer solution  Here today with illness- low grade fevers, cough Her flu and strep are negative CXR does not show pneumonia Prefer to avoid abx given her history of C diff, and she is in agreement.  Will obtain a CBC for her today and follow-up She declines tamiflu She will seek care if getting worse Will plan further follow- up pending labs.   Signed Lamar Blinks, MD

## 2017-08-23 ENCOUNTER — Encounter: Payer: Self-pay | Admitting: Family Medicine

## 2017-08-23 DIAGNOSIS — R059 Cough, unspecified: Secondary | ICD-10-CM

## 2017-08-23 DIAGNOSIS — R05 Cough: Secondary | ICD-10-CM

## 2017-08-23 LAB — CBC
HCT: 39.5 % (ref 36.0–46.0)
Hemoglobin: 12.7 g/dL (ref 12.0–15.0)
MCHC: 32.2 g/dL (ref 30.0–36.0)
MCV: 90.1 fl (ref 78.0–100.0)
Platelets: 255 10*3/uL (ref 150.0–400.0)
RBC: 4.38 Mil/uL (ref 3.87–5.11)
RDW: 15.1 % (ref 11.5–15.5)
WBC: 11 10*3/uL — ABNORMAL HIGH (ref 4.0–10.5)

## 2017-08-26 ENCOUNTER — Telehealth: Payer: Self-pay | Admitting: Family Medicine

## 2017-08-26 ENCOUNTER — Encounter: Payer: Self-pay | Admitting: Family Medicine

## 2017-08-26 MED ORDER — AZITHROMYCIN 250 MG PO TABS: ORAL_TABLET | ORAL | 0 refills | Status: DC

## 2017-08-26 MED ORDER — HYDROCODONE-HOMATROPINE 5-1.5 MG/5ML PO SYRP: 5.0000 mL | ORAL_SOLUTION | Freq: Three times a day (TID) | ORAL | 0 refills | Status: DC | PRN

## 2017-08-26 NOTE — Addendum Note (Signed)
Addended by: Lamar Blinks C on: 08/26/2017 01:12 PM   Modules accepted: Orders

## 2017-08-26 NOTE — Telephone Encounter (Signed)
Called her- she is continuing to cough and run a low grade fever.  She is miserable.  She would like to use a zpack which is reasonable at this time.  Would like something for cough as well- she is able to use hydrocodone. Will rx hycodan for her to her drug store She reports that she called over the weekend and was told that she would be set up with telehealth but this never happened Asked her to please keep me updated as to how she is feeling I will also write her a note for her job

## 2017-08-26 NOTE — Telephone Encounter (Signed)
Pt was informed work note at front desk to pick up.

## 2017-08-26 NOTE — Telephone Encounter (Signed)
Copied from [place] [id number]. Topic: Quick Communication - See Telephone Encounter >> Aug 26, 2017  1:54 PM Rosalin Hawking wrote: CRM for notification. See Telephone encounter for:  08/26/17.  Pt states is needing work letter since pt is still not feeling well. Pt needing the letter to return 08-28-2017 back to work since she is not getting well and will take also tomorrow. Please advise.

## 2017-08-27 ENCOUNTER — Encounter: Payer: Self-pay | Admitting: Family Medicine

## 2017-08-28 ENCOUNTER — Ambulatory Visit: Payer: Self-pay

## 2017-08-28 NOTE — Telephone Encounter (Signed)
Pt states shortly after she called office her cough stopped. Pt states she is ok and not coughing anymore.  Answer Assessment - Initial Assessment Questions 1. REASON FOR CALL or QUESTION: "What is your reason for calling today?" or "How can I best help you?" or "What question do you have that I can help answer?"     Pt states shortly after she called office her cough stopped. Pt states she is ok and not coughing anymore.  Protocols used: INFORMATION ONLY CALL-A-AH

## 2017-09-06 ENCOUNTER — Other Ambulatory Visit: Payer: Self-pay | Admitting: *Deleted

## 2017-09-06 DIAGNOSIS — Z111 Encounter for screening for respiratory tuberculosis: Secondary | ICD-10-CM

## 2017-09-16 ENCOUNTER — Other Ambulatory Visit (INDEPENDENT_AMBULATORY_CARE_PROVIDER_SITE_OTHER): Payer: BC Managed Care – PPO

## 2017-09-16 DIAGNOSIS — Z111 Encounter for screening for respiratory tuberculosis: Secondary | ICD-10-CM | POA: Diagnosis not present

## 2017-09-17 LAB — QUANTIFERON-TB GOLD PLUS
NIL: 0.03 [IU]/mL
QUANTIFERON-TB GOLD PLUS: NEGATIVE
TB1-NIL: 0 IU/mL
TB2-NIL: 0 IU/mL

## 2017-09-20 ENCOUNTER — Encounter (INDEPENDENT_AMBULATORY_CARE_PROVIDER_SITE_OTHER): Payer: Self-pay | Admitting: Orthopedic Surgery

## 2017-09-20 ENCOUNTER — Ambulatory Visit (INDEPENDENT_AMBULATORY_CARE_PROVIDER_SITE_OTHER): Payer: BC Managed Care – PPO | Admitting: Orthopedic Surgery

## 2017-09-20 DIAGNOSIS — M1711 Unilateral primary osteoarthritis, right knee: Secondary | ICD-10-CM

## 2017-09-20 MED ORDER — LIDOCAINE HCL 1 % IJ SOLN
5.0000 mL | INTRAMUSCULAR | Status: AC | PRN
  Administered 2017-09-20: 5 mL

## 2017-09-20 MED ORDER — METHYLPREDNISOLONE ACETATE 40 MG/ML IJ SUSP
40.0000 mg | INTRAMUSCULAR | Status: AC | PRN
  Administered 2017-09-20: 40 mg via INTRA_ARTICULAR

## 2017-09-20 MED ORDER — BUPIVACAINE HCL 0.25 % IJ SOLN
4.0000 mL | INTRAMUSCULAR | Status: AC | PRN
  Administered 2017-09-20: 4 mL via INTRA_ARTICULAR

## 2017-09-20 NOTE — Progress Notes (Signed)
Office Visit Note   Patient: Emily Osborne           Date of Birth: 11/27/1960           MRN: [id number] Visit Date: 09/20/2017 Requested by: [address] [place], [place] [address] [place], [place] [address] PCP: Carollee Herter, Alferd Apa, DO  Subjective: Chief Complaint  Patient presents with  . Right Knee - Pain    HPI: Emily Osborne is a 57 year old patient with right knee pain.  She has a known history of right knee arthritis.  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  She is allergic to nonsteroidals.  She localizes pain today and for the past several weeks over the inferior patellar region.  She denies any mechanical symptoms.  She does have a history of prior arthroscopy.              ROS: All systems reviewed are negative as they relate to the chief complaint within the history of present illness.  Patient denies  fevers or chills.   Assessment & Plan: Visit Diagnoses: No diagnosis found.  Plan: Impression is right knee exacerbation of arthritis with good result from injection last year.  Repeat injection performed today.  She is developing a little bit of a flexion contracture.  I will see her back as needed.  Injection performed today.  Follow-Up Instructions: No Follow-up on file.   Orders:  No orders of the defined types were placed in this encounter.  No orders of the defined types were placed in this encounter.     Procedures: Large Joint Inj: R knee on 09/20/2017 11:05 AM Indications: diagnostic evaluation, joint swelling and pain Details: 18 G 1.5 in needle, superolateral approach  Arthrogram: No  Medications: 5 mL lidocaine 1 %; 40 mg methylPREDNISolone acetate 40 MG/ML; 4 mL bupivacaine 0.25 % Outcome: tolerated well, no immediate complications Procedure, treatment alternatives, risks and benefits explained, specific risks discussed. Consent was given by the patient. Immediately prior to procedure a time out was  called to verify the correct patient, procedure, equipment, support staff and site/side marked as required. Patient was prepped and draped in the usual sterile fashion.       Clinical Data: No additional findings.  Objective: Vital Signs: There were no vitals taken for this visit.  Physical Exam:   Constitutional: Patient appears well-developed HEENT:  Head: Normocephalic Eyes:EOM are normal Neck: Normal range of motion Cardiovascular: Normal rate Pulmonary/chest: Effort normal Neurologic: Patient is alert Skin: Skin is warm Psychiatric: Patient has normal mood and affect    Ortho Exam: Orthopedic exam demonstrates no effusion in the right knee.  Extensor mechanism is intact.  Pedal pulses palpable.  No groin pain with internal/external rotation of the leg.  Patient has medial and lateral joint line tenderness as well as periretinacular tenderness.  Range of motion is from 5-115  Specialty Comments:  No specialty comments available.  Imaging: No results found.   PMFS History: Patient Active Problem List   Diagnosis Date Noted  . S/P partial colectomy 06/18/2017  . Multiple drug allergies 06/18/2017  . Colitis 02/05/2017  . Diarrhea 02/05/2017  . Diverticular disease 01/23/2017  . Helicobacter pylori (H. pylori) infection 12/13/2016  . Routine screening for STI (sexually transmitted infection) 12/13/2016  . Clostridium difficile colitis 12/13/2016  . Diverticulitis 10/10/2016  . Lower abdominal pain 09/29/2016  . Palpitations 02/06/2016  . Mild diastolic dysfunction 10/62/6948  .  Graves disease 01/02/2016  . Acute diverticulitis 12/07/2015  . Acute bacterial sinusitis 10/04/2015  . History of colonic polyps 11/15/2014  . Left shoulder pain 09/28/2013  . Left-sided weakness 09/16/2013  . Obesity (BMI 30-39.9) 09/02/2013  . Myalgia 06/21/2011  . POSTMENOPAUSAL STATUS 09/07/2010  . PAIN IN JOINT, MULTIPLE SITES 12/27/2009  . LOW BACK PAIN, CHRONIC 12/27/2009    . FATIGUE 12/27/2009  . VAGINITIS 11/22/2009  . DYSPEPSIA&OTHER [facility] DISORDERS FUNCTION STOMACH 09/01/2009  . NAUSEA 08/03/2009  . FLATULENCE-GAS-BLOATING 08/03/2009  . ABDOMINAL PAIN RIGHT UPPER QUADRANT 07/06/2009  . SUPRAPUBIC PAIN 07/06/2009  . GANGLION CYST, HX OF 07/06/2009  . BACK PAIN 05/04/2009  . Hyperlipidemia 09/16/2008  . UNSPECIFIED MYALGIA AND MYOSITIS 09/16/2008  . Precordial pain 07/27/2008  . GERD 06/08/2008  . HYPERGLYCEMIA, FASTING 06/08/2008  . HELICOBACTER PYLORI GASTRITIS, HX OF 04/05/2008  . OTHER ACUTE REACTIONS TO STRESS 03/16/2008  . ABDOMINAL PAIN, EPIGASTRIC 03/16/2008  . SHOULDER PAIN, RIGHT 10/01/2007  . ELBOW PAIN, RIGHT 10/01/2007  . ELEVATED BLOOD PRESSURE 04/29/2007  . Pain in limb 04/14/2007  . DEPENDENT EDEMA, RIGHT LEG 04/14/2007  . SHINGLES 04/08/2007  . Asthma 03/18/2007  . GANGLION CYST, WRIST, RIGHT 03/18/2007  . BUNIONECTOMY, HX OF 03/18/2007   Past Medical History:  Diagnosis Date  . Allergy   . Anxiety   . Asthma    pt has inhaler  . C. difficile colitis   . Cataract    bil eyes  . Clostridium difficile colitis 12/13/2016  . Diverticulitis   . Dysrhythmia    palpitations  . Edema   . Elevated blood pressure reading without diagnosis of hypertension    "just elevated when I'm in pain" (12/07/2015)  . Fatty liver   . Fibromyalgia   . Ganglion of joint    right wrist  . GERD (gastroesophageal reflux disease)   . H. pylori infection   . Headache    hx of  . Helicobacter pylori (H. pylori) infection 12/13/2016  . Herpes zoster without mention of complication   . Hyperlipidemia   . Hyperthyroidism   . IBS (irritable bowel syndrome)   . Multiple drug allergies 06/18/2017  . Nontraumatic rupture of Achilles tendon   . Other acute reactions to stress   . Pain in joint, shoulder region   . Pain in joint, upper arm   . Pain in limb   . Personal history of other diseases of digestive system   . Pneumonia    once  . PONV  (postoperative nausea and vomiting)   . Routine screening for STI (sexually transmitted infection) 12/13/2016  . S/P partial colectomy 06/18/2017  . Thyroid disease    hyperthyroidism    Family History  Problem Relation Age of Onset  . Hypertension Father   . Diabetes Father   . Heart disease Father   . Kidney disease Father        Died, 13  . Leukemia Brother   . Cancer Brother        myleoblastic anemia  . Hyperlipidemia Mother   . Hypertension Mother        Died, 70  . Thyroid disease Mother        Thyroid surgery  . Pernicious anemia Sister   . Thyroid disease Sister        On thyroid Rx  . Lupus Daughter   . Coronary artery disease Brother   . Hypertension Brother   . Colon cancer Neg Hx   . Esophageal cancer Neg  Hx   . Rectal cancer Neg Hx   . Stomach cancer Neg Hx   . Pancreatic cancer Neg Hx   . Prostate cancer Neg Hx     Past Surgical History:  Procedure Laterality Date  . ABDOMINAL HYSTERECTOMY    . ACHILLES TENDON REPAIR Right 2005  . BUNIONECTOMY Bilateral    Bunionectomy 1983  . COLON SURGERY  01/23/2017   6 to 8 inches sigmoid colon removed  . GANGLION CYST EXCISION Right    wrist  . LEFT HEART CATH AND CORONARY ANGIOGRAPHY N/A 10/03/2016   Procedure: Left Heart Cath and Coronary Angiography;  Surgeon: Burnell Blanks, MD;  Location: [facility];  Service: Cardiovascular;  Laterality: N/A;  . MENISCUS REPAIR Right 04/2014  . TUBAL LIGATION    . wisdomteeth extraction     Social History   Occupational History  . Occupation: Nurse/teacher  Tobacco Use  . Smoking status: Never Smoker  . Smokeless tobacco: Never Used  Substance and Sexual Activity  . Alcohol use: No    Alcohol/week: 0.0 oz  . Drug use: No  . Sexual activity: Not Currently    Partners: Male

## 2017-09-29 ENCOUNTER — Other Ambulatory Visit: Payer: Self-pay | Admitting: Gastroenterology

## 2017-09-30 ENCOUNTER — Encounter: Payer: Self-pay | Admitting: Family Medicine

## 2017-09-30 NOTE — Telephone Encounter (Signed)
Rescheduled appt for 10/11/17.

## 2017-10-01 ENCOUNTER — Ambulatory Visit: Payer: BC Managed Care – PPO | Admitting: Family Medicine

## 2017-10-11 ENCOUNTER — Ambulatory Visit: Payer: BC Managed Care – PPO | Admitting: Family Medicine

## 2017-10-11 ENCOUNTER — Encounter: Payer: Self-pay | Admitting: Family Medicine

## 2017-10-11 VITALS — BP 120/76 | HR 69 | Temp 98.0°F | Resp 16 | Ht 66.0 in | Wt 227.6 lb

## 2017-10-11 DIAGNOSIS — E785 Hyperlipidemia, unspecified: Secondary | ICD-10-CM | POA: Diagnosis not present

## 2017-10-11 DIAGNOSIS — R110 Nausea: Secondary | ICD-10-CM

## 2017-10-11 DIAGNOSIS — Z79899 Other long term (current) drug therapy: Secondary | ICD-10-CM | POA: Diagnosis not present

## 2017-10-11 DIAGNOSIS — R519 Headache, unspecified: Secondary | ICD-10-CM

## 2017-10-11 DIAGNOSIS — J302 Other seasonal allergic rhinitis: Secondary | ICD-10-CM | POA: Diagnosis not present

## 2017-10-11 DIAGNOSIS — F419 Anxiety disorder, unspecified: Secondary | ICD-10-CM | POA: Insufficient documentation

## 2017-10-11 DIAGNOSIS — R51 Headache: Secondary | ICD-10-CM

## 2017-10-11 LAB — COMPREHENSIVE METABOLIC PANEL
ALT: 23 U/L (ref 0–35)
AST: 28 U/L (ref 0–37)
Albumin: 3.9 g/dL (ref 3.5–5.2)
Alkaline Phosphatase: 66 U/L (ref 39–117)
BUN: 12 mg/dL (ref 6–23)
CALCIUM: 9.8 mg/dL (ref 8.4–10.5)
CO2: 27 mEq/L (ref 19–32)
Chloride: 107 mEq/L (ref 96–112)
Creatinine, Ser: 0.81 mg/dL (ref 0.40–1.20)
GFR: 93.49 mL/min (ref >60.00)
Glucose, Bld: 82 mg/dL (ref 70–99)
POTASSIUM: 4.5 meq/L (ref 3.5–5.1)
SODIUM: 142 meq/L (ref 135–145)
TOTAL PROTEIN: 7 g/dL (ref 6.0–8.3)
Total Bilirubin: 0.7 mg/dL (ref 0.2–1.2)

## 2017-10-11 LAB — LIPID PANEL
CHOLESTEROL: 184 mg/dL (ref 0–200)
HDL: 50.4 mg/dL (ref >39.00)
LDL CALC: 113 mg/dL — AB (ref 0–99)
NonHDL: 133.31
Total CHOL/HDL Ratio: 4
Triglycerides: 103 mg/dL (ref 0.0–149.0)
VLDL: 20.6 mg/dL (ref 0.0–40.0)

## 2017-10-11 LAB — SEDIMENTATION RATE: SED RATE: 11 mm/h (ref 0–30)

## 2017-10-11 MED ORDER — ALPRAZOLAM 0.25 MG PO TABS: 0.2500 mg | ORAL_TABLET | Freq: Three times a day (TID) | ORAL | 2 refills | Status: DC | PRN

## 2017-10-11 MED ORDER — FLUTICASONE PROPIONATE 50 MCG/ACT NA SUSP: NASAL | 4 refills | Status: DC

## 2017-10-11 MED ORDER — FEXOFENADINE HCL 180 MG PO TABS: 180.0000 mg | ORAL_TABLET | Freq: Every day | ORAL | 3 refills | Status: DC

## 2017-10-11 MED ORDER — PROMETHAZINE HCL 25 MG PO TABS: ORAL_TABLET | ORAL | 0 refills | Status: DC

## 2017-10-11 NOTE — Patient Instructions (Signed)

## 2017-10-11 NOTE — Assessment & Plan Note (Signed)
stable °

## 2017-10-11 NOTE — Progress Notes (Signed)
Patient ID: Emily Osborne, female   DOB: 12-Dec-1960, 57 y.o.   MRN: [id number]     Subjective:  I acted as a Education administrator for Dr. Carollee Herter.  Guerry Bruin, [place]   Patient ID: Emily Osborne, female    DOB: 10/23/1960, 57 y.o.   MRN: [id number]  Chief Complaint  Patient presents with  . Hyperlipidemia    HPI  Patient is in today for follow up cholesterol.   No complaints except more headaches due to stress.    Patient Care Team: Carollee Herter, Alferd Apa, DO as PCP - General Philemon Kingdom, MD as Consulting Physician (Internal Medicine) Armbruster, Carlota Raspberry, MD as Consulting Physician (Gastroenterology) Leighton Ruff, MD as Consulting Physician (General Surgery)   Past Medical History:  Diagnosis Date  . Allergy   . Anxiety   . Asthma    pt has inhaler  . C. difficile colitis   . Cataract    bil eyes  . Clostridium difficile colitis 12/13/2016  . Diverticulitis   . Dysrhythmia    palpitations  . Edema   . Elevated blood pressure reading without diagnosis of hypertension    "just elevated when I'm in pain" (12/07/2015)  . Fatty liver   . Fibromyalgia   . Ganglion of joint    right wrist  . GERD (gastroesophageal reflux disease)   . H. pylori infection   . Headache    hx of  . Helicobacter pylori (H. pylori) infection 12/13/2016  . Herpes zoster without mention of complication   . Hyperlipidemia   . Hyperthyroidism   . IBS (irritable bowel syndrome)   . Multiple drug allergies 06/18/2017  . Nontraumatic rupture of Achilles tendon   . Other acute reactions to stress   . Pain in joint, shoulder region   . Pain in joint, upper arm   . Pain in limb   . Personal history of other diseases of digestive system   . Pneumonia    once  . PONV (postoperative nausea and vomiting)   . Routine screening for STI (sexually transmitted infection) 12/13/2016  . S/P partial colectomy 06/18/2017  . Thyroid disease    hyperthyroidism    Past Surgical History:  Procedure Laterality Date   . ABDOMINAL HYSTERECTOMY    . ACHILLES TENDON REPAIR Right 2005  . BUNIONECTOMY Bilateral    Bunionectomy 1983  . COLON SURGERY  01/23/2017   6 to 8 inches sigmoid colon removed  . GANGLION CYST EXCISION Right    wrist  . LEFT HEART CATH AND CORONARY ANGIOGRAPHY N/A 10/03/2016   Procedure: Left Heart Cath and Coronary Angiography;  Surgeon: Burnell Blanks, MD;  Location: [facility];  Service: Cardiovascular;  Laterality: N/A;  . MENISCUS REPAIR Right 04/2014  . TUBAL LIGATION    . wisdomteeth extraction      Family History  Problem Relation Age of Onset  . Hypertension Father   . Diabetes Father   . Heart disease Father   . Kidney disease Father        Died, 44  . Leukemia Brother   . Cancer Brother        myleoblastic anemia  . Hyperlipidemia Mother   . Hypertension Mother        Died, 59  . Thyroid disease Mother        Thyroid surgery  . Pernicious anemia Sister   . Thyroid disease Sister        On thyroid Rx  . Lupus Daughter   .  Coronary artery disease Brother   . Hypertension Brother   . Colon cancer Neg Hx   . Esophageal cancer Neg Hx   . Rectal cancer Neg Hx   . Stomach cancer Neg Hx   . Pancreatic cancer Neg Hx   . Prostate cancer Neg Hx     Social History   Socioeconomic History  . Marital status: Divorced    Spouse name: Not on file  . Number of children: 2  . Years of education: Not on file  . Highest education level: Not on file  Social Needs  . Financial resource strain: Not on file  . Food insecurity - worry: Not on file  . Food insecurity - inability: Not on file  . Transportation needs - medical: Not on file  . Transportation needs - non-medical: Not on file  Occupational History  . Occupation: Nurse/teacher  Tobacco Use  . Smoking status: Never Smoker  . Smokeless tobacco: Never Used  Substance and Sexual Activity  . Alcohol use: No    Alcohol/week: 0.0 oz  . Drug use: No  . Sexual activity: Not Currently     Partners: Male  Other Topics Concern  . Not on file  Social History Narrative   Lives with alone.  She has two daughter.   She works as a Programme researcher, broadcasting/film/video at [employer]--- no    Outpatient Medications Prior to Visit  Medication Sig Dispense Refill  . acetaminophen (TYLENOL) 500 MG tablet Take 1,000 mg by mouth every 6 (six) hours as needed for moderate pain.     Marland Kitchen albuterol (PROAIR HFA) 108 (90 Base) MCG/ACT inhaler USE 2 PUFFS EVERY 4 HOURS AS NEEDED FOR COUGH, WHEEZE OR SHORTNESS OF BREATH. 8.5 each 2  . albuterol (PROVENTIL) (2.5 MG/3ML) 0.083% nebulizer solution Take 3 mLs (2.5 mg total) by nebulization every 6 (six) hours as needed for wheezing or shortness of breath. 75 mL 3  . atenolol (TENORMIN) 25 MG tablet Take 0.5 tablets (12.5 mg total) by mouth daily. Takes 0.5 tablet 45 tablet 3  . Blood Glucose Monitoring Suppl (FREESTYLE FREEDOM) KIT 1 Device by Does not apply route daily. 1 each 0  . CLINPRO 5000 1.1 % PSTE Take 1 application by mouth See admin instructions. Brush teeth nightly  4  . DEXILANT 60 MG capsule TAKE ONE CAPSULE BY MOUTH DAILY 90 capsule 0  . diclofenac sodium (VOLTAREN) 1 % GEL Apply 2 g topically 2 (two) times daily as needed (pain). For pain    . Flaxseed, Linseed, (FLAXSEED OIL PO) Take by mouth.    . Fluticasone-Salmeterol (ADVAIR DISKUS) 250-50 MCG/DOSE AEPB Inhale 1 puff into the lungs 2 (two) times daily. 60 each 2  . FREESTYLE LITE test strip USE TO CHECK BLOOD SUGAR ONCE A DAY 100 each 0  . Iron Combinations (I.L.X. B-12) ELIX Take 1 each by mouth daily as needed (energy). 1 droplet full    . lidocaine (LIDODERM) 5 % APPLY 1 PATCH ON THE SKIN EVERY 12 HRS REMOVE, DISCARD PATCH WITHIN 12 HRS OR AS DIRECTED 30 patch 0  . Probiotic Product (VSL#3) CAPS Take 1 capsule by mouth 2 (two) times daily. 60 capsule 3  . valACYclovir (VALTREX) 1000 MG tablet Take 1 tablet (1,000 mg total) by mouth 3 (three) times daily. 30 tablet 5  . ALPRAZolam (XANAX) 0.25  MG tablet Take 1 tablet (0.25 mg total) by mouth 3 (three) times daily as needed. 30 tablet 2  . fexofenadine (ALLEGRA) 180  MG tablet Take 1 tablet (180 mg total) by mouth daily. 90 tablet 3  . fluticasone (FLONASE) 50 MCG/ACT nasal spray PLACE 2 SPRAYS INTO BOTH NOSTRILS DAILY. 16 g 4  . promethazine (PHENERGAN) 25 MG tablet TAKE 1 TABLET BY MOUTH 4 TIMES A DAY AS NEEDED FOR NAUSEA OR VOMITING 30 tablet 0  . atovaquone-proguanil (MALARONE) 250-100 MG TABS tablet Take 1 tablet daily by mouth. 21 tablet 0  . azithromycin (ZITHROMAX) 250 MG tablet Use as a zpack 6 tablet 0  . dexlansoprazole (DEXILANT) 60 MG capsule Take 60 mg by mouth daily.    Marland Kitchen HYDROcodone-homatropine (HYCODAN) 5-1.5 MG/5ML syrup Take 5 mLs by mouth every 8 (eight) hours as needed for cough. 90 mL 0  . lidocaine (LIDODERM) 5 % APPLY 1 PATCH ON THE SKIN EVERY 12 HRS REMOVE, DISCARD PATCH WITHIN 12 HRS OR AS DIRECTED 30 patch 0  . methimazole (TAPAZOLE) 5 MG tablet Take 0.5 tablets (2.5 mg total) by mouth daily. 90 tablet 1  . nitazoxanide (ALINIA) 500 MG tablet Take 1 tablet (500 mg total) by mouth 2 (two) times daily with a meal. 28 tablet 0  . omeprazole (PRILOSEC) 40 MG capsule Take 1 capsule (40 mg total) by mouth 2 (two) times daily. 28 capsule 0  . ondansetron (ZOFRAN) 4 MG tablet Take an hour before antibiotics TWICE daily, and can take another 1-2 doses if needed for nausea prn 60 tablet 1  . triamcinolone cream (KENALOG) 0.1 % APPLY TO AFFECTED AREA TWICE A DAY (Patient taking differently: APPLY TO AFFECTED AREA TWICE A DAY AS NEEDED FOR RASH) 30 g 0   No facility-administered medications prior to visit.     Allergies  Allergen Reactions  . Aspirin Other (See Comments)    REACTION: anaphylaxis  . Gentamicin Other (See Comments)    Eye drops turned the sclera bright red  . Ibuprofen Other (See Comments)    REACTION: anaphylaxsis  . Metronidazole Swelling    REACTION: red face/swelling  . Nsaids Other (See  Comments)    REACTION: anaphylaxis  . Doxycycline Other (See Comments)    REACTION: severe nausea/vomiting  . Influenza A (H1n1) Mon[place] Vac Other (See Comments)    REACTION: sick for 3 weeks  . Loratadine Other (See Comments)    Fatigue/weakness  . Periactin [Cyproheptadine] Other (See Comments)    paranoid   . Ranitidine Hcl Other (See Comments)    REACTION: Lips turned red /peel  . Sulfamethoxazole-Trimethoprim Other (See Comments)    REACTION: face red/peel  . Tramadol Hcl Other (See Comments)    REACTION: paranoid    Review of Systems  Constitutional: Negative for fever and malaise/fatigue.  HENT: Negative for congestion.   Eyes: Negative for blurred vision.  Respiratory: Negative for cough and shortness of breath.   Cardiovascular: Negative for chest pain, palpitations and leg swelling.  Gastrointestinal: Negative for vomiting.  Musculoskeletal: Negative for back pain.  Skin: Negative for rash.  Neurological: Negative for loss of consciousness and headaches.       Objective:    Physical Exam  Constitutional: She is oriented to person, place, and time. She appears well-developed and well-nourished.  HENT:  Head: Normocephalic and atraumatic.  Eyes: Conjunctivae and EOM are normal.  Neck: Normal range of motion. Neck supple. No JVD present. Carotid bruit is not present. No thyromegaly present.  Cardiovascular: Normal rate, regular rhythm and normal heart sounds.  No murmur heard. Pulmonary/Chest: Effort normal and breath sounds normal. No respiratory distress. She  has no wheezes. She has no rales. She exhibits no tenderness.  Musculoskeletal: She exhibits no edema.  Neurological: She is alert and oriented to person, place, and time.  Psychiatric: She has a normal mood and affect.  Nursing note and vitals reviewed.   BP 120/76 (BP Location: Right Arm, Cuff Size: Large)   Pulse 69   Temp 98 F (36.7 C) (Oral)   Resp 16   Ht 5' 6" (1.676 m)   Wt 227 lb 9.6 oz  (103.2 kg)   SpO2 97%   BMI 36.74 kg/m  Wt Readings from Last 3 Encounters:  10/11/17 227 lb 9.6 oz (103.2 kg)  08/22/17 230 lb 6.4 oz (104.5 kg)  06/18/17 232 lb (105.2 kg)   BP Readings from Last 3 Encounters:  10/11/17 120/76  08/22/17 110/72  06/18/17 132/85     Immunization History  Administered Date(s) Administered  . Hepatitis A, Adult 06/20/2017  . IPV 07/01/2017  . Influenza Whole 05/05/2008  . Meningococcal Mcv4o 06/20/2017  . PPD Test 07/02/2011, 07/07/2012, 07/13/2013, 07/06/2014, 08/11/2015  . Pneumococcal Polysaccharide-23 09/21/2011  . Td 09/27/1999, 09/07/2010  . Tdap 07/01/2017  . Typhoid Inactivated 06/17/2017  . Zoster 06/20/2012    Health Maintenance  Topic Date Due  . PAP SMEAR  09/07/2017  . COLONOSCOPY  09/29/2018  . MAMMOGRAM  04/24/2019  . TETANUS/TDAP  07/02/2027  . Hepatitis C Screening  Completed  . HIV Screening  Completed    Lab Results  Component Value Date   WBC 11.0 (H) 08/22/2017   HGB 12.7 08/22/2017   HCT 39.5 08/22/2017   PLT 255.0 08/22/2017   GLUCOSE 96 07/09/2017   CHOL 200 07/09/2017   TRIG 108.0 07/09/2017   HDL 51.60 07/09/2017   LDLDIRECT 139.4 12/26/2011   LDLCALC 126 (H) 07/09/2017   ALT 27 07/09/2017   AST 25 07/09/2017   NA 142 07/09/2017   K 4.2 07/09/2017   CL 106 07/09/2017   CREATININE 0.89 07/09/2017   BUN 13 07/09/2017   CO2 29 07/09/2017   TSH 0.90 05/07/2017   INR 0.93 10/03/2016   HGBA1C 5.5 01/15/2017    Lab Results  Component Value Date   TSH 0.90 05/07/2017   Lab Results  Component Value Date   WBC 11.0 (H) 08/22/2017   HGB 12.7 08/22/2017   HCT 39.5 08/22/2017   MCV 90.1 08/22/2017   PLT 255.0 08/22/2017   Lab Results  Component Value Date   NA 142 07/09/2017   K 4.2 07/09/2017   CO2 29 07/09/2017   GLUCOSE 96 07/09/2017   BUN 13 07/09/2017   CREATININE 0.89 07/09/2017   BILITOT 0.5 07/09/2017   ALKPHOS 70 07/09/2017   AST 25 07/09/2017   ALT 27 07/09/2017   PROT 6.8  07/09/2017   ALBUMIN 3.8 07/09/2017   CALCIUM 9.3 07/09/2017   ANIONGAP 7 02/08/2017   GFR 83.93 07/09/2017   Lab Results  Component Value Date   CHOL 200 07/09/2017   Lab Results  Component Value Date   HDL 51.60 07/09/2017   Lab Results  Component Value Date   LDLCALC 126 (H) 07/09/2017   Lab Results  Component Value Date   TRIG 108.0 07/09/2017   Lab Results  Component Value Date   CHOLHDL 4 07/09/2017   Lab Results  Component Value Date   HGBA1C 5.5 01/15/2017         Assessment & Plan:   Problem List Items Addressed This Visit      Unprioritized  Anxiety    stable      Relevant Medications   ALPRAZolam (XANAX) 0.25 MG tablet   Other Relevant Orders   Pain Mgmt, Profile 8 w/Conf, U   Hyperlipidemia    Tolerating statin, encouraged heart healthy diet, avoid trans fats, minimize simple carbs and saturated fats. Increase exercise as tolerated      Hyperlipidemia LDL goal <100   Relevant Orders   Lipid panel   Comprehensive metabolic panel    Other Visit Diagnoses    High risk medication use    -  Primary   Relevant Orders   Pain Mgmt, Profile 8 w/Conf, U   Seasonal allergies       Relevant Medications   fluticasone (FLONASE) 50 MCG/ACT nasal spray   fexofenadine (ALLEGRA) 180 MG tablet   Nausea       Relevant Medications   promethazine (PHENERGAN) 25 MG tablet   Headache causing frequent awakening from sleep       Relevant Orders   Sedimentation rate      I have discontinued Avel Peace. Enderle's triamcinolone cream, methimazole, dexlansoprazole, nitazoxanide, ondansetron, omeprazole, atovaquone-proguanil, azithromycin, and HYDROcodone-homatropine. I am also having her maintain her acetaminophen, FREESTYLE FREEDOM, diclofenac sodium, I.L.X. B-12, CLINPRO 5000, FREESTYLE LITE, lidocaine, albuterol, Fluticasone-Salmeterol, valACYclovir, (Flaxseed, Linseed, (FLAXSEED OIL PO)), VSL#3, atenolol, albuterol, DEXILANT, fluticasone, promethazine,  ALPRAZolam, and fexofenadine.  Meds ordered this encounter  Medications  . fluticasone (FLONASE) 50 MCG/ACT nasal spray    Sig: PLACE 2 SPRAYS INTO BOTH NOSTRILS DAILY.    Dispense:  16 g    Refill:  4  . DISCONTD: fexofenadine (ALLEGRA) 180 MG tablet    Sig: Take 1 tablet (180 mg total) by mouth daily.    Dispense:  90 tablet    Refill:  3  . promethazine (PHENERGAN) 25 MG tablet    Sig: TAKE 1 TABLET BY MOUTH 4 TIMES A DAY AS NEEDED FOR NAUSEA OR VOMITING    Dispense:  30 tablet    Refill:  0  . DISCONTD: ALPRAZolam (XANAX) 0.25 MG tablet    Sig: Take 1 tablet (0.25 mg total) by mouth 3 (three) times daily as needed.    Dispense:  30 tablet    Refill:  2    This request is for a new prescription for a controlled substance as required by Federal/State law.  . ALPRAZolam (XANAX) 0.25 MG tablet    Sig: Take 1 tablet (0.25 mg total) by mouth 3 (three) times daily as needed.    Dispense:  30 tablet    Refill:  2    This request is for a new prescription for a controlled substance as required by Federal/State law.  . fexofenadine (ALLEGRA) 180 MG tablet    Sig: Take 1 tablet (180 mg total) by mouth daily.    Dispense:  90 tablet    Refill:  3    CMA served as scribe during this visit. History, Physical and Plan performed by medical provider. Documentation and orders reviewed and attested to.  [name], DO

## 2017-10-11 NOTE — Assessment & Plan Note (Signed)
Tolerating statin, encouraged heart healthy diet, avoid trans fats, minimize simple carbs and saturated fats. Increase exercise as tolerated 

## 2017-10-16 LAB — PAIN MGMT, PROFILE 8 W/CONF, U
6 Acetylmorphine: NEGATIVE ng/mL (ref <10)
ALCOHOL METABOLITES: NEGATIVE ng/mL (ref <500)
ALPHAHYDROXYMIDAZOLAM: NEGATIVE ng/mL (ref <50)
Alphahydroxyalprazolam: NEGATIVE ng/mL (ref <25)
Alphahydroxytriazolam: NEGATIVE ng/mL (ref <50)
Aminoclonazepam: NEGATIVE ng/mL (ref <25)
Amphetamines: NEGATIVE ng/mL (ref <500)
Benzodiazepines: NEGATIVE ng/mL (ref <100)
Buprenorphine, Urine: NEGATIVE ng/mL (ref <5)
COCAINE METABOLITE: NEGATIVE ng/mL (ref <150)
Creatinine: 145.6 mg/dL
HYDROXYETHYLFLURAZEPAM: NEGATIVE ng/mL (ref <50)
LORAZEPAM: NEGATIVE ng/mL (ref <50)
MARIJUANA METABOLITE: NEGATIVE ng/mL (ref <20)
MDMA: NEGATIVE ng/mL (ref <500)
NORDIAZEPAM: NEGATIVE ng/mL (ref <50)
OPIATES: NEGATIVE ng/mL (ref <100)
Oxazepam: NEGATIVE ng/mL (ref <50)
Oxidant: NEGATIVE ug/mL (ref <200)
Oxycodone: NEGATIVE ng/mL (ref <100)
Temazepam: NEGATIVE ng/mL (ref <50)
pH: 6.41 (ref 4.5–9.0)

## 2017-11-01 ENCOUNTER — Other Ambulatory Visit: Payer: Self-pay | Admitting: Internal Medicine

## 2017-11-01 DIAGNOSIS — E0500 Thyrotoxicosis with diffuse goiter without thyrotoxic crisis or storm: Secondary | ICD-10-CM

## 2017-11-04 ENCOUNTER — Other Ambulatory Visit: Payer: BC Managed Care – PPO

## 2017-11-06 ENCOUNTER — Encounter: Payer: Self-pay | Admitting: Family Medicine

## 2017-11-06 ENCOUNTER — Other Ambulatory Visit: Payer: BC Managed Care – PPO

## 2017-11-06 NOTE — Telephone Encounter (Signed)
Ivin Booty--- I believe the paperwork probably went to you.  DR [facility]

## 2017-11-13 ENCOUNTER — Telehealth: Payer: Self-pay | Admitting: *Deleted

## 2017-11-13 ENCOUNTER — Other Ambulatory Visit (INDEPENDENT_AMBULATORY_CARE_PROVIDER_SITE_OTHER): Payer: BC Managed Care – PPO

## 2017-11-13 DIAGNOSIS — E0500 Thyrotoxicosis with diffuse goiter without thyrotoxic crisis or storm: Secondary | ICD-10-CM

## 2017-11-13 LAB — T4, FREE: Free T4: 0.75 ng/dL (ref 0.60–1.60)

## 2017-11-13 LAB — TSH: TSH: 0.83 u[IU]/mL (ref 0.35–4.50)

## 2017-11-13 LAB — T3, FREE: T3, Free: 3.3 pg/mL (ref 2.3–4.2)

## 2017-11-13 NOTE — Telephone Encounter (Signed)
Completed FMLA paperwork for patient to care for her daughter, Emily Osborne, who suffers from severe BiPolar and Anxiety Disorders [last forms were from 2014]; forwarded to provider/SLS 03/27

## 2017-11-14 ENCOUNTER — Other Ambulatory Visit: Payer: Self-pay | Admitting: Internal Medicine

## 2017-11-14 DIAGNOSIS — E0500 Thyrotoxicosis with diffuse goiter without thyrotoxic crisis or storm: Secondary | ICD-10-CM

## 2017-11-15 ENCOUNTER — Encounter: Payer: Self-pay | Admitting: Internal Medicine

## 2017-11-21 ENCOUNTER — Ambulatory Visit: Payer: BC Managed Care – PPO | Admitting: Family Medicine

## 2017-11-21 ENCOUNTER — Encounter: Payer: Self-pay | Admitting: Family Medicine

## 2017-11-21 VITALS — BP 132/78 | HR 63 | Resp 16 | Ht 66.0 in | Wt 231.6 lb

## 2017-11-21 DIAGNOSIS — I10 Essential (primary) hypertension: Secondary | ICD-10-CM | POA: Diagnosis not present

## 2017-11-21 DIAGNOSIS — R079 Chest pain, unspecified: Secondary | ICD-10-CM | POA: Diagnosis not present

## 2017-11-21 MED ORDER — AMLODIPINE BESYLATE 5 MG PO TABS: 5.0000 mg | ORAL_TABLET | Freq: Every day | ORAL | 3 refills | Status: DC

## 2017-11-21 NOTE — Progress Notes (Signed)
Patient ID: Emily Osborne, female    DOB: 1961/03/28  Age: 57 y.o. MRN: [id number]    Subjective:  Subjective  HPI Emily Osborne presents for headaches and bp elevation.  She also c/o chest pain on occasion  -- she describes L arm aching .     No sob.  Cp lasts a few minutes and is at rest.     Review of Systems  Constitutional: Negative for fever.  HENT: Negative for congestion.   Respiratory: Positive for chest tightness. Negative for shortness of breath.   Cardiovascular: Positive for chest pain. Negative for palpitations and leg swelling.  Gastrointestinal: Negative for abdominal pain, blood in stool and nausea.  Genitourinary: Negative for dysuria and frequency.  Skin: Negative for rash.  Allergic/Immunologic: Negative for environmental allergies.  Neurological: Positive for headaches. Negative for dizziness.  Psychiatric/Behavioral: The patient is not nervous/anxious.     History Past Medical History:  Diagnosis Date  . Allergy   . Anxiety   . Asthma    pt has inhaler  . C. difficile colitis   . Cataract    bil eyes  . Clostridium difficile colitis 12/13/2016  . Diverticulitis   . Dysrhythmia    palpitations  . Edema   . Elevated blood pressure reading without diagnosis of hypertension    "just elevated when I'm in pain" (12/07/2015)  . Fatty liver   . Fibromyalgia   . Ganglion of joint    right wrist  . GERD (gastroesophageal reflux disease)   . H. pylori infection   . Headache    hx of  . Helicobacter pylori (H. pylori) infection 12/13/2016  . Herpes zoster without mention of complication   . Hyperlipidemia   . Hyperthyroidism   . IBS (irritable bowel syndrome)   . Multiple drug allergies 06/18/2017  . Nontraumatic rupture of Achilles tendon   . Other acute reactions to stress   . Pain in joint, shoulder region   . Pain in joint, upper arm   . Pain in limb   . Personal history of other diseases of digestive system   . Pneumonia    once  . PONV  (postoperative nausea and vomiting)   . Routine screening for STI (sexually transmitted infection) 12/13/2016  . S/P partial colectomy 06/18/2017  . Thyroid disease    hyperthyroidism    She has a past surgical history that includes Abdominal hysterectomy; Tubal ligation; Bunionectomy (Bilateral); Achilles tendon repair (Right, 2005); Ganglion cyst excision (Right); Meniscus repair (Right, 04/2014); wisdomteeth extraction; LEFT HEART CATH AND CORONARY ANGIOGRAPHY (N/A, 10/03/2016); and Colon surgery (01/23/2017).   Her family history includes Cancer in her brother; Coronary artery disease in her brother; Diabetes in her father; Heart disease in her father; Hyperlipidemia in her mother; Hypertension in her brother, father, and mother; Kidney disease in her father; Leukemia in her brother; Lupus in her daughter; Pernicious anemia in her sister; Thyroid disease in her mother and sister.She reports that she has never smoked. She has never used smokeless tobacco. She reports that she does not drink alcohol or use drugs.  Current Outpatient Medications on File Prior to Visit  Medication Sig Dispense Refill  . acetaminophen (TYLENOL) 500 MG tablet Take 1,000 mg by mouth every 6 (six) hours as needed for moderate pain.     Marland Kitchen albuterol (PROAIR HFA) 108 (90 Base) MCG/ACT inhaler USE 2 PUFFS EVERY 4 HOURS AS NEEDED FOR COUGH, WHEEZE OR SHORTNESS OF BREATH. 8.5 each 2  . albuterol (PROVENTIL) (  2.5 MG/3ML) 0.083% nebulizer solution Take 3 mLs (2.5 mg total) by nebulization every 6 (six) hours as needed for wheezing or shortness of breath. 75 mL 3  . ALPRAZolam (XANAX) 0.25 MG tablet Take 1 tablet (0.25 mg total) by mouth 3 (three) times daily as needed. 30 tablet 2  . atenolol (TENORMIN) 25 MG tablet Take 0.5 tablets (12.5 mg total) by mouth daily. Takes 0.5 tablet 45 tablet 3  . Blood Glucose Monitoring Suppl (FREESTYLE FREEDOM) KIT 1 Device by Does not apply route daily. 1 each 0  . CLINPRO 5000 1.1 % PSTE Take  1 application by mouth See admin instructions. Brush teeth nightly  4  . DEXILANT 60 MG capsule TAKE ONE CAPSULE BY MOUTH DAILY 90 capsule 0  . diclofenac sodium (VOLTAREN) 1 % GEL Apply 2 g topically 2 (two) times daily as needed (pain). For pain    . fexofenadine (ALLEGRA) 180 MG tablet Take 1 tablet (180 mg total) by mouth daily. 90 tablet 3  . Flaxseed, Linseed, (FLAXSEED OIL PO) Take by mouth. One capsule daily    . fluticasone (FLONASE) 50 MCG/ACT nasal spray PLACE 2 SPRAYS INTO BOTH NOSTRILS DAILY. 16 g 4  . Fluticasone-Salmeterol (ADVAIR DISKUS) 250-50 MCG/DOSE AEPB Inhale 1 puff into the lungs 2 (two) times daily. 60 each 2  . FREESTYLE LITE test strip USE TO CHECK BLOOD SUGAR ONCE A DAY 100 each 0  . Iron Combinations (I.L.X. B-12) ELIX Take 1 each by mouth daily as needed (energy). 1 droplet full    . lidocaine (LIDODERM) 5 % APPLY 1 PATCH ON THE SKIN EVERY 12 HRS REMOVE, DISCARD PATCH WITHIN 12 HRS OR AS DIRECTED 30 patch 0  . promethazine (PHENERGAN) 25 MG tablet TAKE 1 TABLET BY MOUTH 4 TIMES A DAY AS NEEDED FOR NAUSEA OR VOMITING 30 tablet 0  . valACYclovir (VALTREX) 1000 MG tablet Take 1 tablet (1,000 mg total) by mouth 3 (three) times daily. (Patient taking differently: Take 1,000 mg by mouth 3 (three) times daily. As needed) 30 tablet 5  . Probiotic Product (VSL#3) CAPS Take 1 capsule by mouth 2 (two) times daily. (Patient not taking: Reported on 11/21/2017) 60 capsule 3   No current facility-administered medications on file prior to visit.      Objective:  Objective  Physical Exam  Constitutional: She is oriented to person, place, and time. She appears well-developed and well-nourished.  HENT:  Head: Normocephalic and atraumatic.  Eyes: Conjunctivae and EOM are normal.  Neck: Normal range of motion. Neck supple. No JVD present. Carotid bruit is not present. No thyromegaly present.  Cardiovascular: Normal rate, regular rhythm and normal heart sounds.  No murmur  heard. Pulmonary/Chest: Effort normal and breath sounds normal. No respiratory distress. She has no wheezes. She has no rales. She exhibits no tenderness.  Musculoskeletal: She exhibits no edema.  Neurological: She is alert and oriented to person, place, and time. No cranial nerve deficit. Coordination normal.  Psychiatric: She has a normal mood and affect.  Nursing note and vitals reviewed.  BP 132/78 (BP Location: Right Arm, Patient Position: Sitting, Cuff Size: Large)   Pulse 63   Resp 16   Ht _0  (1.676 m)   Wt 231 lb 9.6 oz (105.1 kg)   SpO2 99%   BMI 37.38 kg/m  Wt Readings from Last 3 Encounters:  11/21/17 231 lb 9.6 oz (105.1 kg)  10/11/17 227 lb 9.6 oz (103.2 kg)  08/22/17 230 lb 6.4 oz (104.5 kg)  Lab Results  Component Value Date   WBC 11.0 (H) 08/22/2017   HGB 12.7 08/22/2017   HCT 39.5 08/22/2017   PLT 255.0 08/22/2017   GLUCOSE 82 10/11/2017   CHOL 184 10/11/2017   TRIG 103.0 10/11/2017   HDL 50.40 10/11/2017   LDLDIRECT 139.4 12/26/2011   LDLCALC 113 (H) 10/11/2017   ALT 23 10/11/2017   AST 28 10/11/2017   NA 142 10/11/2017   K 4.5 10/11/2017   CL 107 10/11/2017   CREATININE 0.81 10/11/2017   BUN 12 10/11/2017   CO2 27 10/11/2017   TSH 0.83 11/13/2017   INR 0.93 10/03/2016   HGBA1C 5.5 01/15/2017    Dg Chest 2 View  Result Date: 08/22/2017 CLINICAL DATA:  Cough, fever. EXAM: CHEST  2 VIEW COMPARISON:  Radiograph of October 14, 2016. FINDINGS: The heart size and mediastinal contours are within normal limits. Both lungs are clear. No pneumothorax or pleural effusion is noted. The visualized skeletal structures are unremarkable. IMPRESSION: No active cardiopulmonary disease. Electronically Signed   By: Marijo Conception, M.D.   On: 08/22/2017 16:09     Assessment & Plan:  Plan  I am having Rudene Christians start on amLODipine. I am also having her maintain her acetaminophen, FREESTYLE FREEDOM, diclofenac sodium, I.L.X. B-12, CLINPRO 5000, FREESTYLE  LITE, lidocaine, albuterol, Fluticasone-Salmeterol, valACYclovir, (Flaxseed, Linseed, (FLAXSEED OIL PO)), VSL#3, atenolol, albuterol, DEXILANT, fluticasone, promethazine, ALPRAZolam, and fexofenadine.  Meds ordered this encounter  Medications  . amLODipine (NORVASC) 5 MG tablet    Sig: Take 1 tablet (5 mg total) by mouth daily.    Dispense:  30 tablet    Refill:  3    Problem List Items Addressed This Visit      Unprioritized   HTN (hypertension)    Poorly controlled will alter medications, encouraged DASH diet, minimize caffeine and obtain adequate sleep. Report concerning symptoms and follow up as directed and as needed      Relevant Medications   amLODipine (NORVASC) 5 MG tablet    Other Visit Diagnoses    Chest pain, unspecified type    -  Primary   Relevant Orders   EKG 12-Lead (Completed)    ekg -- sinus brady-- no acute changes  Follow-up: Return in about 3 weeks (around 12/12/2017), or if symptoms worsen or fail to improve, for htn check.  [name], DO

## 2017-11-21 NOTE — Telephone Encounter (Signed)
Paperwork faxed on 11/15/17; patient was given copy in office on 11/21/17//SLS 04/04

## 2017-11-21 NOTE — Patient Instructions (Signed)
Chest Wall Pain °Chest wall pain is pain in or around the bones and muscles of your chest. Sometimes, an injury causes this pain. Sometimes, the cause may not be known. This pain may take several weeks or longer to get better. °Follow these instructions at home: °Pay attention to any changes in your symptoms. Take these actions to help with your pain: °· Rest as told by your health care provider. °· Avoid activities that cause pain. These include any activities that use your chest muscles or your abdominal and side muscles to lift heavy items. °· If directed, apply ice to the painful area: °? Put ice in a plastic bag. °? Place a towel between your skin and the bag. °? Leave the ice on for 20 minutes, 2-3 times per day. °· Take over-the-counter and prescription medicines only as told by your health care provider. °· Do not use tobacco products, including cigarettes, chewing tobacco, and e-cigarettes. If you need help quitting, ask your health care provider. °· Keep all follow-up visits as told by your health care provider. This is important. ° °Contact a health care provider if: °· You have a fever. °· Your chest pain becomes worse. °· You have new symptoms. °Get help right away if: °· You have nausea or vomiting. °· You feel sweaty or light-headed. °· You have a cough with phlegm (sputum) or you cough up blood. °· You develop shortness of breath. °This information is not intended to replace advice given to you by your health care provider. Make sure you discuss any questions you have with your health care provider. °Document Released: 08/06/2005 Document Revised: 12/15/2015 Document Reviewed: 11/01/2014 °Elsevier Interactive Patient Education © 2018 Elsevier Inc. ° °

## 2017-11-21 NOTE — Assessment & Plan Note (Signed)
Poorly controlled will alter medications, encouraged DASH diet, minimize caffeine and obtain adequate sleep. Report concerning symptoms and follow up as directed and as needed 

## 2017-11-22 ENCOUNTER — Encounter: Payer: Self-pay | Admitting: Family Medicine

## 2017-11-25 ENCOUNTER — Encounter: Payer: Self-pay | Admitting: Family Medicine

## 2017-11-25 ENCOUNTER — Ambulatory Visit: Payer: BC Managed Care – PPO | Admitting: Family Medicine

## 2017-11-25 ENCOUNTER — Other Ambulatory Visit: Payer: Self-pay | Admitting: Family Medicine

## 2017-11-25 VITALS — BP 130/78 | HR 98 | Temp 97.8°F | Resp 16 | Ht 66.0 in | Wt 228.8 lb

## 2017-11-25 DIAGNOSIS — I10 Essential (primary) hypertension: Secondary | ICD-10-CM | POA: Diagnosis not present

## 2017-11-25 DIAGNOSIS — J4520 Mild intermittent asthma, uncomplicated: Secondary | ICD-10-CM

## 2017-11-25 NOTE — Progress Notes (Signed)
Patient ID: Emily Osborne, female   DOB: [birth year], 57 y.o.   MRN: [id number]     Subjective:  I acted as a Education administrator for Dr. Carollee Herter.  Guerry Bruin, [place]   Patient ID: Emily Osborne, female    DOB: 1961/05/24, 57 y.o.   MRN: [id number]  Chief Complaint  Patient presents with  . Hypertension    HPI  Patient is in today for follow up blood pressure.  She had a bad headache on Thursday night around 11pm.  She is now better.   bp early this am 108/58 , p 59.  No  Headaces.     Patient Care Team: Carollee Herter, Alferd Apa, DO as PCP - General Philemon Kingdom, MD as Consulting Physician (Internal Medicine) Armbruster, Carlota Raspberry, MD as Consulting Physician (Gastroenterology) Leighton Ruff, MD as Consulting Physician (General Surgery)   Past Medical History:  Diagnosis Date  . Allergy   . Anxiety   . Asthma    pt has inhaler  . C. difficile colitis   . Cataract    bil eyes  . Clostridium difficile colitis 12/13/2016  . Diverticulitis   . Dysrhythmia    palpitations  . Edema   . Elevated blood pressure reading without diagnosis of hypertension    "just elevated when I'm in pain" (12/07/2015)  . Fatty liver   . Fibromyalgia   . Ganglion of joint    right wrist  . GERD (gastroesophageal reflux disease)   . H. pylori infection   . Headache    hx of  . Helicobacter pylori (H. pylori) infection 12/13/2016  . Herpes zoster without mention of complication   . Hyperlipidemia   . Hyperthyroidism   . IBS (irritable bowel syndrome)   . Multiple drug allergies 06/18/2017  . Nontraumatic rupture of Achilles tendon   . Other acute reactions to stress   . Pain in joint, shoulder region   . Pain in joint, upper arm   . Pain in limb   . Personal history of other diseases of digestive system   . Pneumonia    once  . PONV (postoperative nausea and vomiting)   . Routine screening for STI (sexually transmitted infection) 12/13/2016  . S/P partial colectomy 06/18/2017  . Thyroid disease    hyperthyroidism    Past Surgical History:  Procedure Laterality Date  . ABDOMINAL HYSTERECTOMY    . ACHILLES TENDON REPAIR Right 2005  . BUNIONECTOMY Bilateral    Bunionectomy 1983  . COLON SURGERY  01/23/2017   6 to 8 inches sigmoid colon removed  . GANGLION CYST EXCISION Right    wrist  . LEFT HEART CATH AND CORONARY ANGIOGRAPHY N/A 10/03/2016   Procedure: Left Heart Cath and Coronary Angiography;  Surgeon: Burnell Blanks, MD;  Location: [facility];  Service: Cardiovascular;  Laterality: N/A;  . MENISCUS REPAIR Right 04/2014  . TUBAL LIGATION    . wisdomteeth extraction      Family History  Problem Relation Age of Onset  . Hypertension Father   . Diabetes Father   . Heart disease Father   . Kidney disease Father        Died, 67  . Leukemia Brother   . Cancer Brother        myleoblastic anemia  . Hyperlipidemia Mother   . Hypertension Mother        Died, 50  . Thyroid disease Mother        Thyroid surgery  . Pernicious anemia Sister   .  Thyroid disease Sister        On thyroid Rx  . Lupus Daughter   . Coronary artery disease Brother   . Hypertension Brother   . Colon cancer Neg Hx   . Esophageal cancer Neg Hx   . Rectal cancer Neg Hx   . Stomach cancer Neg Hx   . Pancreatic cancer Neg Hx   . Prostate cancer Neg Hx     Social History   Socioeconomic History  . Marital status: Divorced    Spouse name: Not on file  . Number of children: 2  . Years of education: Not on file  . Highest education level: Not on file  Occupational History  . Occupation: Personnel officer  Social Needs  . Financial resource strain: Not on file  . Food insecurity:    Worry: Not on file    Inability: Not on file  . Transportation needs:    Medical: Not on file    Non-medical: Not on file  Tobacco Use  . Smoking status: Never Smoker  . Smokeless tobacco: Never Used  Substance and Sexual Activity  . Alcohol use: No    Alcohol/week: 0.0 oz  . Drug use: No  .  Sexual activity: Not Currently    Partners: Male  Lifestyle  . Physical activity:    Days per week: Not on file    Minutes per session: Not on file  . Stress: Not on file  Relationships  . Social connections:    Talks on phone: Not on file    Gets together: Not on file    Attends religious service: Not on file    Active member of club or organization: Not on file    Attends meetings of clubs or organizations: Not on file    Relationship status: Not on file  . Intimate partner violence:    Fear of current or ex partner: Not on file    Emotionally abused: Not on file    Physically abused: Not on file    Forced sexual activity: Not on file  Other Topics Concern  . Not on file  Social History Narrative   Lives with alone.  She has two daughter.   She works as a Programme researcher, broadcasting/film/video at [employer]--- no    Outpatient Medications Prior to Visit  Medication Sig Dispense Refill  . acetaminophen (TYLENOL) 500 MG tablet Take 1,000 mg by mouth every 6 (six) hours as needed for moderate pain.     Marland Kitchen ADVAIR DISKUS 250-50 MCG/DOSE AEPB TAKE 1 PUFF BY MOUTH TWICE A DAY 60 each 2  . albuterol (PROAIR HFA) 108 (90 Base) MCG/ACT inhaler USE 2 PUFFS EVERY 4 HOURS AS NEEDED FOR COUGH, WHEEZE OR SHORTNESS OF BREATH. 8.5 each 2  . albuterol (PROVENTIL) (2.5 MG/3ML) 0.083% nebulizer solution Take 3 mLs (2.5 mg total) by nebulization every 6 (six) hours as needed for wheezing or shortness of breath. 75 mL 3  . ALPRAZolam (XANAX) 0.25 MG tablet Take 1 tablet (0.25 mg total) by mouth 3 (three) times daily as needed. 30 tablet 2  . amLODipine (NORVASC) 5 MG tablet Take 1 tablet (5 mg total) by mouth daily. 30 tablet 3  . atenolol (TENORMIN) 25 MG tablet Take 0.5 tablets (12.5 mg total) by mouth daily. Takes 0.5 tablet 45 tablet 3  . Blood Glucose Monitoring Suppl (FREESTYLE FREEDOM) KIT 1 Device by Does not apply route daily. 1 each 0  . CLINPRO 5000 1.1 % PSTE Take 1  application by mouth See admin  instructions. Brush teeth nightly  4  . DEXILANT 60 MG capsule TAKE ONE CAPSULE BY MOUTH DAILY 90 capsule 0  . diclofenac sodium (VOLTAREN) 1 % GEL Apply 2 g topically 2 (two) times daily as needed (pain). For pain    . fexofenadine (ALLEGRA) 180 MG tablet Take 1 tablet (180 mg total) by mouth daily. 90 tablet 3  . Flaxseed, Linseed, (FLAXSEED OIL PO) Take by mouth. One capsule daily    . fluticasone (FLONASE) 50 MCG/ACT nasal spray PLACE 2 SPRAYS INTO BOTH NOSTRILS DAILY. 16 g 4  . FREESTYLE LITE test strip USE TO CHECK BLOOD SUGAR ONCE A DAY 100 each 0  . Iron Combinations (I.L.X. B-12) ELIX Take 1 each by mouth daily as needed (energy). 1 droplet full    . lidocaine (LIDODERM) 5 % APPLY 1 PATCH ON THE SKIN EVERY 12 HRS REMOVE, DISCARD PATCH WITHIN 12 HRS OR AS DIRECTED 30 patch 0  . Probiotic Product (VSL#3) CAPS Take 1 capsule by mouth 2 (two) times daily. 60 capsule 3  . promethazine (PHENERGAN) 25 MG tablet TAKE 1 TABLET BY MOUTH 4 TIMES A DAY AS NEEDED FOR NAUSEA OR VOMITING 30 tablet 0  . valACYclovir (VALTREX) 1000 MG tablet Take 1 tablet (1,000 mg total) by mouth 3 (three) times daily. (Patient taking differently: Take 1,000 mg by mouth 3 (three) times daily. As needed) 30 tablet 5   No facility-administered medications prior to visit.     Allergies  Allergen Reactions  . Aspirin Other (See Comments)    REACTION: anaphylaxis  . Gentamicin Other (See Comments)    Eye drops turned the sclera bright red  . Ibuprofen Other (See Comments)    REACTION: anaphylaxsis  . Metronidazole Swelling    REACTION: red face/swelling  . Nsaids Other (See Comments)    REACTION: anaphylaxis  . Doxycycline Other (See Comments)    REACTION: severe nausea/vomiting  . Influenza A (H1n1) Mon[place] Vac Other (See Comments)    REACTION: sick for 3 weeks  . Loratadine Other (See Comments)    Fatigue/weakness  . Periactin [Cyproheptadine] Other (See Comments)    paranoid   . Ranitidine Hcl Other (See  Comments)    REACTION: Lips turned red /peel  . Sulfamethoxazole-Trimethoprim Other (See Comments)    REACTION: face red/peel  . Tramadol Hcl Other (See Comments)    REACTION: paranoid    Review of Systems  Constitutional: Negative for fever and malaise/fatigue.  HENT: Negative for congestion.   Eyes: Negative for blurred vision.  Respiratory: Negative for cough and shortness of breath.   Cardiovascular: Negative for chest pain, palpitations and leg swelling.  Gastrointestinal: Negative for vomiting.  Musculoskeletal: Negative for back pain.  Skin: Negative for rash.  Neurological: Negative for loss of consciousness and headaches.       Objective:    Physical Exam  Constitutional: She is oriented to person, place, and time. She appears well-developed and well-nourished.  HENT:  Head: Normocephalic and atraumatic.  Eyes: Conjunctivae and EOM are normal.  Neck: Normal range of motion. Neck supple. No JVD present. Carotid bruit is not present. No thyromegaly present.  Cardiovascular: Normal rate, regular rhythm and normal heart sounds.  No murmur heard. Pulmonary/Chest: Effort normal and breath sounds normal. No respiratory distress. She has no wheezes. She has no rales. She exhibits no tenderness.  Musculoskeletal: She exhibits no edema.  Neurological: She is alert and oriented to person, place, and time.  Psychiatric: She  has a normal mood and affect.  Nursing note and vitals reviewed.   BP 130/78 (BP Location: Right Arm, Cuff Size: Large)   Pulse 98   Temp 97.8 F (36.6 C) (Oral)   Resp 16   Ht 5' 6"  (1.676 m)   Wt 228 lb 12.8 oz (103.8 kg)   SpO2 97%   BMI 36.93 kg/m  Wt Readings from Last 3 Encounters:  11/25/17 228 lb 12.8 oz (103.8 kg)  11/21/17 231 lb 9.6 oz (105.1 kg)  10/11/17 227 lb 9.6 oz (103.2 kg)   BP Readings from Last 3 Encounters:  11/25/17 130/78  11/21/17 132/78  10/11/17 120/76     Immunization History  Administered Date(s) Administered    . Hepatitis A, Adult 06/20/2017  . IPV 07/01/2017  . Influenza Whole 05/05/2008  . Meningococcal Mcv4o 06/20/2017  . PPD Test 07/02/2011, 07/07/2012, 07/13/2013, 07/06/2014, 08/11/2015  . Pneumococcal Polysaccharide-23 09/21/2011  . Td 09/27/1999, 09/07/2010  . Tdap 07/01/2017  . Typhoid Inactivated 06/17/2017  . Zoster 06/20/2012    Health Maintenance  Topic Date Due  . PAP SMEAR  09/07/2017  . COLONOSCOPY  09/29/2018  . MAMMOGRAM  04/24/2019  . TETANUS/TDAP  07/02/2027  . Hepatitis C Screening  Completed  . HIV Screening  Completed    Lab Results  Component Value Date   WBC 11.0 (H) 08/22/2017   HGB 12.7 08/22/2017   HCT 39.5 08/22/2017   PLT 255.0 08/22/2017   GLUCOSE 82 10/11/2017   CHOL 184 10/11/2017   TRIG 103.0 10/11/2017   HDL 50.40 10/11/2017   LDLDIRECT 139.4 12/26/2011   LDLCALC 113 (H) 10/11/2017   ALT 23 10/11/2017   AST 28 10/11/2017   NA 142 10/11/2017   K 4.5 10/11/2017   CL 107 10/11/2017   CREATININE 0.81 10/11/2017   BUN 12 10/11/2017   CO2 27 10/11/2017   TSH 0.83 11/13/2017   INR 0.93 10/03/2016   HGBA1C 5.5 01/15/2017    Lab Results  Component Value Date   TSH 0.83 11/13/2017   Lab Results  Component Value Date   WBC 11.0 (H) 08/22/2017   HGB 12.7 08/22/2017   HCT 39.5 08/22/2017   MCV 90.1 08/22/2017   PLT 255.0 08/22/2017   Lab Results  Component Value Date   NA 142 10/11/2017   K 4.5 10/11/2017   CO2 27 10/11/2017   GLUCOSE 82 10/11/2017   BUN 12 10/11/2017   CREATININE 0.81 10/11/2017   BILITOT 0.7 10/11/2017   ALKPHOS 66 10/11/2017   AST 28 10/11/2017   ALT 23 10/11/2017   PROT 7.0 10/11/2017   ALBUMIN 3.9 10/11/2017   CALCIUM 9.8 10/11/2017   ANIONGAP 7 02/08/2017   GFR 93.49 10/11/2017   Lab Results  Component Value Date   CHOL 184 10/11/2017   Lab Results  Component Value Date   HDL 50.40 10/11/2017   Lab Results  Component Value Date   LDLCALC 113 (H) 10/11/2017   Lab Results  Component Value  Date   TRIG 103.0 10/11/2017   Lab Results  Component Value Date   CHOLHDL 4 10/11/2017   Lab Results  Component Value Date   HGBA1C 5.5 01/15/2017         Assessment & Plan:   Problem List Items Addressed This Visit    None    Visit Diagnoses    HTN, goal below 130/80    -  Primary   Relevant Orders   Catecholamine+VMA, 24-Hr Urine   Metanephrines, Urine,  24 hour   Catecholamine+VMA, 24-Hr Urine    con't norvasc rto 2-3 weeks I am having Rudene Christians maintain her acetaminophen, FREESTYLE FREEDOM, diclofenac sodium, I.L.X. B-12, CLINPRO 5000, FREESTYLE LITE, lidocaine, albuterol, valACYclovir, (Flaxseed, Linseed, (FLAXSEED OIL PO)), VSL#3, atenolol, albuterol, DEXILANT, fluticasone, promethazine, ALPRAZolam, fexofenadine, amLODipine, and ADVAIR DISKUS.  No orders of the defined types were placed in this encounter.   CMA served as Education administrator during this visit. History, Physical and Plan performed by medical provider. Documentation and orders reviewed and attested to.  [name], DO

## 2017-11-25 NOTE — Patient Instructions (Signed)
DASH Eating Plan DASH stands for "Dietary Approaches to Stop Hypertension." The DASH eating plan is a healthy eating plan that has been shown to reduce high blood pressure (hypertension). It may also reduce your risk for type 2 diabetes, heart disease, and stroke. The DASH eating plan may also help with weight loss. What are tips for following this plan? General guidelines  Avoid eating more than 2,300 mg (milligrams) of salt (sodium) a day. If you have hypertension, you may need to reduce your sodium intake to 1,500 mg a day.  Limit alcohol intake to no more than 1 drink a day for nonpregnant women and 2 drinks a day for men. One drink equals 12 oz of beer, 5 oz of wine, or 1 oz of hard liquor.  Work with your health care provider to maintain a healthy body weight or to lose weight. Ask what an ideal weight is for you.  Get at least 30 minutes of exercise that causes your heart to beat faster (aerobic exercise) most days of the week. Activities may include walking, swimming, or biking.  Work with your health care provider or diet and nutrition specialist (dietitian) to adjust your eating plan to your individual calorie needs. Reading food labels  Check food labels for the amount of sodium per serving. Choose foods with less than 5 percent of the Daily Value of sodium. Generally, foods with less than 300 mg of sodium per serving fit into this eating plan.  To find whole grains, look for the word "whole" as the first word in the ingredient list. Shopping  Buy products labeled as "low-sodium" or "no salt added."  Buy fresh foods. Avoid canned foods and premade or frozen meals. Cooking  Avoid adding salt when cooking. Use salt-free seasonings or herbs instead of table salt or sea salt. Check with your health care provider or pharmacist before using salt substitutes.  Do not fry foods. Cook foods using healthy methods such as baking, boiling, grilling, and broiling instead.  Cook with  heart-healthy oils, such as olive, canola, soybean, or sunflower oil. Meal planning   Eat a balanced diet that includes: ? 5 or more servings of fruits and vegetables each day. At each meal, try to fill half of your plate with fruits and vegetables. ? Up to 6-8 servings of whole grains each day. ? Less than 6 oz of lean meat, poultry, or fish each day. A 3-oz serving of meat is about the same size as a deck of cards. One egg equals 1 oz. ? 2 servings of low-fat dairy each day. ? A serving of nuts, seeds, or beans 5 times each week. ? Heart-healthy fats. Healthy fats called Omega-3 fatty acids are found in foods such as flaxseeds and coldwater fish, like sardines, salmon, and mackerel.  Limit how much you eat of the following: ? Canned or prepackaged foods. ? Food that is high in trans fat, such as fried foods. ? Food that is high in saturated fat, such as fatty meat. ? Sweets, desserts, sugary drinks, and other foods with added sugar. ? Full-fat dairy products.  Do not salt foods before eating.  Try to eat at least 2 vegetarian meals each week.  Eat more home-cooked food and less restaurant, buffet, and fast food.  When eating at a restaurant, ask that your food be prepared with less salt or no salt, if possible. What foods are recommended? The items listed may not be a complete list. Talk with your dietitian about what   dietary choices are best for you. Grains Whole-grain or whole-wheat bread. Whole-grain or whole-wheat pasta. Brown rice. Oatmeal. Quinoa. Bulgur. Whole-grain and low-sodium cereals. Pita bread. Low-fat, low-sodium crackers. Whole-wheat flour tortillas. Vegetables Fresh or frozen vegetables (raw, steamed, roasted, or grilled). Low-sodium or reduced-sodium tomato and vegetable juice. Low-sodium or reduced-sodium tomato sauce and tomato paste. Low-sodium or reduced-sodium canned vegetables. Fruits All fresh, dried, or frozen fruit. Canned fruit in natural juice (without  added sugar). Meat and other protein foods Skinless chicken or turkey. Ground chicken or turkey. Pork with fat trimmed off. Fish and seafood. Egg whites. Dried beans, peas, or lentils. Unsalted nuts, nut butters, and seeds. Unsalted canned beans. Lean cuts of beef with fat trimmed off. Low-sodium, lean deli meat. Dairy Low-fat (1%) or fat-free (skim) milk. Fat-free, low-fat, or reduced-fat cheeses. Nonfat, low-sodium ricotta or cottage cheese. Low-fat or nonfat yogurt. Low-fat, low-sodium cheese. Fats and oils Soft margarine without trans fats. Vegetable oil. Low-fat, reduced-fat, or light mayonnaise and salad dressings (reduced-sodium). Canola, safflower, olive, soybean, and sunflower oils. Avocado. Seasoning and other foods Herbs. Spices. Seasoning mixes without salt. Unsalted popcorn and pretzels. Fat-free sweets. What foods are not recommended? The items listed may not be a complete list. Talk with your dietitian about what dietary choices are best for you. Grains Baked goods made with fat, such as croissants, muffins, or some breads. Dry pasta or rice meal packs. Vegetables Creamed or fried vegetables. Vegetables in a cheese sauce. Regular canned vegetables (not low-sodium or reduced-sodium). Regular canned tomato sauce and paste (not low-sodium or reduced-sodium). Regular tomato and vegetable juice (not low-sodium or reduced-sodium). Pickles. Olives. Fruits Canned fruit in a light or heavy syrup. Fried fruit. Fruit in cream or butter sauce. Meat and other protein foods Fatty cuts of meat. Ribs. Fried meat. Bacon. Sausage. Bologna and other processed lunch meats. Salami. Fatback. Hotdogs. Bratwurst. Salted nuts and seeds. Canned beans with added salt. Canned or smoked fish. Whole eggs or egg yolks. Chicken or turkey with skin. Dairy Whole or 2% milk, cream, and half-and-half. Whole or full-fat cream cheese. Whole-fat or sweetened yogurt. Full-fat cheese. Nondairy creamers. Whipped toppings.  Processed cheese and cheese spreads. Fats and oils Butter. Stick margarine. Lard. Shortening. Ghee. Bacon fat. Tropical oils, such as coconut, palm kernel, or palm oil. Seasoning and other foods Salted popcorn and pretzels. Onion salt, garlic salt, seasoned salt, table salt, and sea salt. Worcestershire sauce. Tartar sauce. Barbecue sauce. Teriyaki sauce. Soy sauce, including reduced-sodium. Steak sauce. Canned and packaged gravies. Fish sauce. Oyster sauce. Cocktail sauce. Horseradish that you find on the shelf. Ketchup. Mustard. Meat flavorings and tenderizers. Bouillon cubes. Hot sauce and Tabasco sauce. Premade or packaged marinades. Premade or packaged taco seasonings. Relishes. Regular salad dressings. Where to find more information:  National Heart, Lung, and Blood Institute: www.nhlbi.nih.gov  American Heart Association: www.heart.org Summary  The DASH eating plan is a healthy eating plan that has been shown to reduce high blood pressure (hypertension). It may also reduce your risk for type 2 diabetes, heart disease, and stroke.  With the DASH eating plan, you should limit salt (sodium) intake to 2,300 mg a day. If you have hypertension, you may need to reduce your sodium intake to 1,500 mg a day.  When on the DASH eating plan, aim to eat more fresh fruits and vegetables, whole grains, lean proteins, low-fat dairy, and heart-healthy fats.  Work with your health care provider or diet and nutrition specialist (dietitian) to adjust your eating plan to your individual   calorie needs. This information is not intended to replace advice given to you by your health care provider. Make sure you discuss any questions you have with your health care provider. Document Released: 07/26/2011 Document Revised: 07/30/2016 Document Reviewed: 07/30/2016 Elsevier Interactive Patient Education  2018 Elsevier Inc.  

## 2017-11-26 NOTE — Addendum Note (Signed)
Addended by: Caffie Pinto on: 11/26/2017 02:10 PM   Modules accepted: Orders

## 2017-11-26 NOTE — Addendum Note (Signed)
Addended by: Caffie Pinto on: 11/26/2017 02:06 PM   Modules accepted: Orders

## 2017-12-02 ENCOUNTER — Other Ambulatory Visit: Payer: BC Managed Care – PPO

## 2017-12-05 ENCOUNTER — Encounter (INDEPENDENT_AMBULATORY_CARE_PROVIDER_SITE_OTHER): Payer: BC Managed Care – PPO

## 2017-12-05 LAB — METANEPHRINES, URINE, 24 HOUR
METANEPHRINES UR: 76 ug/(24.h) — AB (ref 90–315)
Metaneph Total, Ur: 473 mcg/24 h (ref 224–832)
Normetanephrine, 24H Ur: 397 mcg/24 h (ref 122–676)
VOLUME, URINE-VMAUR: 1950 mL

## 2017-12-07 LAB — CATECHOLAMINES, FRACTIONATED, URINE, 24 HOUR: Creatinine, Urine: 1.87 g/(24.h) (ref 0.50–2.15)

## 2017-12-07 LAB — VMA, URINE, 24 HOUR
Creatinine, 24H, Ur: 1.86 g/(24.h) (ref 0.50–2.15)
VANILLYLMANDELIC ACID: 4.6 mg/(24.h)
Volume, Urine-PORPF: 1950 mL

## 2017-12-12 ENCOUNTER — Encounter (INDEPENDENT_AMBULATORY_CARE_PROVIDER_SITE_OTHER): Payer: Self-pay | Admitting: Family Medicine

## 2017-12-12 ENCOUNTER — Ambulatory Visit (INDEPENDENT_AMBULATORY_CARE_PROVIDER_SITE_OTHER): Payer: BC Managed Care – PPO | Admitting: Family Medicine

## 2017-12-12 ENCOUNTER — Ambulatory Visit: Payer: BC Managed Care – PPO

## 2017-12-12 VITALS — BP 110/74 | HR 59 | Temp 97.9°F | Ht 65.0 in | Wt 226.0 lb

## 2017-12-12 DIAGNOSIS — Z6837 Body mass index (BMI) 37.0-37.9, adult: Secondary | ICD-10-CM

## 2017-12-12 DIAGNOSIS — E0590 Thyrotoxicosis, unspecified without thyrotoxic crisis or storm: Secondary | ICD-10-CM | POA: Diagnosis not present

## 2017-12-12 DIAGNOSIS — R0602 Shortness of breath: Secondary | ICD-10-CM | POA: Diagnosis not present

## 2017-12-12 DIAGNOSIS — Z9189 Other specified personal risk factors, not elsewhere classified: Secondary | ICD-10-CM | POA: Diagnosis not present

## 2017-12-12 DIAGNOSIS — Z1331 Encounter for screening for depression: Secondary | ICD-10-CM

## 2017-12-12 DIAGNOSIS — R5383 Other fatigue: Secondary | ICD-10-CM | POA: Diagnosis not present

## 2017-12-12 DIAGNOSIS — Z0289 Encounter for other administrative examinations: Secondary | ICD-10-CM

## 2017-12-13 LAB — CBC WITH DIFFERENTIAL
BASOS: 1 %
Basophils Absolute: 0 10*3/uL (ref 0.0–0.2)
EOS (ABSOLUTE): 0.1 10*3/uL (ref 0.0–0.4)
EOS: 2 %
HEMATOCRIT: 41.8 % (ref 34.0–46.6)
HEMOGLOBIN: 13.5 g/dL (ref 11.1–15.9)
Immature Grans (Abs): 0 10*3/uL (ref 0.0–0.1)
Immature Granulocytes: 0 %
Lymphocytes Absolute: 2.3 10*3/uL (ref 0.7–3.1)
Lymphs: 50 %
MCH: 28.6 pg (ref 26.6–33.0)
MCHC: 32.3 g/dL (ref 31.5–35.7)
MCV: 89 fL (ref 79–97)
MONOS ABS: 0.4 10*3/uL (ref 0.1–0.9)
Monocytes: 8 %
Neutrophils Absolute: 1.8 10*3/uL (ref 1.4–7.0)
Neutrophils: 39 %
RBC: 4.72 x10E6/uL (ref 3.77–5.28)
RDW: 15.1 % (ref 12.3–15.4)
WBC: 4.5 10*3/uL (ref 3.4–10.8)

## 2017-12-13 LAB — COMPREHENSIVE METABOLIC PANEL
A/G RATIO: 1.7 (ref 1.2–2.2)
ALBUMIN: 4.5 g/dL (ref 3.5–5.5)
ALT: 30 IU/L (ref 0–32)
AST: 25 IU/L (ref 0–40)
Alkaline Phosphatase: 76 IU/L (ref 39–117)
BUN / CREAT RATIO: 16 (ref 9–23)
BUN: 13 mg/dL (ref 6–24)
Bilirubin Total: 0.5 mg/dL (ref 0.0–1.2)
CALCIUM: 9.9 mg/dL (ref 8.7–10.2)
CO2: 27 mmol/L (ref 20–29)
Chloride: 104 mmol/L (ref 96–106)
Creatinine, Ser: 0.82 mg/dL (ref 0.57–1.00)
GFR calc Af Amer: 92 mL/min/{1.73_m2} (ref >59)
GFR, EST NON AFRICAN AMERICAN: 80 mL/min/{1.73_m2} (ref >59)
GLOBULIN, TOTAL: 2.7 g/dL (ref 1.5–4.5)
Glucose: 88 mg/dL (ref 65–99)
POTASSIUM: 4.5 mmol/L (ref 3.5–5.2)
Sodium: 144 mmol/L (ref 134–144)
Total Protein: 7.2 g/dL (ref 6.0–8.5)

## 2017-12-13 LAB — INSULIN, RANDOM: INSULIN: 17.6 u[IU]/mL (ref 2.6–24.9)

## 2017-12-13 LAB — LIPID PANEL WITH LDL/HDL RATIO
Cholesterol, Total: 196 mg/dL (ref 100–199)
HDL: 56 mg/dL (ref >39)
LDL CALC: 121 mg/dL — AB (ref 0–99)
LDl/HDL Ratio: 2.2 ratio (ref 0.0–3.2)
TRIGLYCERIDES: 94 mg/dL (ref 0–149)
VLDL CHOLESTEROL CAL: 19 mg/dL (ref 5–40)

## 2017-12-13 LAB — HEMOGLOBIN A1C
Est. average glucose Bld gHb Est-mCnc: 120 mg/dL
Hgb A1c MFr Bld: 5.8 % — ABNORMAL HIGH (ref 4.8–5.6)

## 2017-12-13 LAB — VITAMIN D 25 HYDROXY (VIT D DEFICIENCY, FRACTURES): Vit D, 25-Hydroxy: 29.4 ng/mL — ABNORMAL LOW (ref 30.0–100.0)

## 2017-12-13 LAB — FOLATE: Folate: 15.2 ng/mL (ref >3.0)

## 2017-12-13 LAB — VITAMIN B12: Vitamin B-12: 781 pg/mL (ref 232–1245)

## 2017-12-16 ENCOUNTER — Other Ambulatory Visit: Payer: Self-pay | Admitting: Family Medicine

## 2017-12-16 ENCOUNTER — Encounter (INDEPENDENT_AMBULATORY_CARE_PROVIDER_SITE_OTHER): Payer: Self-pay | Admitting: Family Medicine

## 2017-12-16 ENCOUNTER — Ambulatory Visit: Payer: BC Managed Care – PPO | Admitting: Family Medicine

## 2017-12-16 ENCOUNTER — Encounter: Payer: Self-pay | Admitting: Family Medicine

## 2017-12-16 DIAGNOSIS — I10 Essential (primary) hypertension: Secondary | ICD-10-CM

## 2017-12-16 DIAGNOSIS — H1010 Acute atopic conjunctivitis, unspecified eye: Secondary | ICD-10-CM

## 2017-12-16 DIAGNOSIS — E785 Hyperlipidemia, unspecified: Secondary | ICD-10-CM

## 2017-12-16 MED ORDER — AZELASTINE HCL 0.05 % OP SOLN
1.0000 [drp] | Freq: Two times a day (BID) | OPHTHALMIC | 12 refills | Status: DC
Start: 2017-12-16 — End: 2018-07-29

## 2017-12-16 NOTE — Progress Notes (Signed)
Patient ID: Emily Osborne, female    DOB: 22-May-1961  Age: 57 y.o. MRN: [id number]    Subjective:  Subjective  HPI Emily Osborne presents for bp check ---  Pt bp has been running low.  She was getting a little dizzy and wonders if its the meds.    Review of Systems  Constitutional: Negative for appetite change, diaphoresis, fatigue, fever and unexpected weight change.  HENT: Negative for congestion.   Eyes: Negative for pain, redness and visual disturbance.  Respiratory: Negative for cough, chest tightness, shortness of breath and wheezing.   Cardiovascular: Negative for chest pain, palpitations and leg swelling.  Gastrointestinal: Negative for abdominal pain, blood in stool and nausea.  Endocrine: Negative for cold intolerance, heat intolerance, polydipsia, polyphagia and polyuria.  Genitourinary: Negative for difficulty urinating, dysuria and frequency.  Skin: Negative for rash.  Allergic/Immunologic: Negative for environmental allergies.  Neurological: Positive for light-headedness. Negative for dizziness, numbness and headaches.  Psychiatric/Behavioral: The patient is not nervous/anxious.     History Past Medical History:  Diagnosis Date  . Allergy   . Anxiety   . Asthma    pt has inhaler  . Back pain   . C. difficile colitis   . Cataract    bil eyes  . Chest pain   . Chronic fatigue syndrome   . Clostridium difficile colitis 12/13/2016  . Diverticulitis   . Dyspnea   . Dysrhythmia    palpitations  . Edema   . Elevated blood pressure reading without diagnosis of hypertension    "just elevated when I'm in pain" (12/07/2015)  . Fatty liver   . Fibromyalgia   . Ganglion of joint    right wrist  . GERD (gastroesophageal reflux disease)   . H. pylori infection   . Headache    hx of  . Helicobacter pylori (H. pylori) infection 12/13/2016  . Herpes zoster without mention of complication   . HTN (hypertension)   . Hyperlipidemia   . Hyperthyroidism   . IBS  (irritable bowel syndrome)   . Leg edema   . Multiple drug allergies 06/18/2017  . Myalgia   . Nontraumatic rupture of Achilles tendon   . Other acute reactions to stress   . Pain in joint, shoulder region   . Pain in joint, upper arm   . Pain in limb   . Palpitations   . Personal history of other diseases of digestive system   . Pneumonia    once  . PONV (postoperative nausea and vomiting)   . Routine screening for STI (sexually transmitted infection) 12/13/2016  . S/P partial colectomy 06/18/2017  . Thyroid disease    hyperthyroidism  . Vitamin B 12 deficiency     She has a past surgical history that includes Abdominal hysterectomy; Tubal ligation; Bunionectomy (Bilateral); Achilles tendon repair (Right, 2005); Ganglion cyst excision (Right); Meniscus repair (Right, 04/2014); wisdomteeth extraction; LEFT HEART CATH AND CORONARY ANGIOGRAPHY (N/A, 10/03/2016); and Colon surgery (01/23/2017).   Her family history includes Cancer in her brother; Coronary artery disease in her brother; Diabetes in her father; Heart disease in her father and mother; Hyperlipidemia in her father and mother; Hypertension in her brother, father, and mother; Kidney disease in her father; Leukemia in her brother; Lupus in her daughter; Obesity in her mother; Pernicious anemia in her sister; Thyroid disease in her mother and sister.She reports that she has never smoked. She has never used smokeless tobacco. She reports that she does not drink alcohol or  use drugs.  Current Outpatient Medications on File Prior to Visit  Medication Sig Dispense Refill  . acetaminophen (TYLENOL) 500 MG tablet Take 1,000 mg by mouth every 6 (six) hours as needed for moderate pain.     Marland Kitchen ADVAIR DISKUS 250-50 MCG/DOSE AEPB TAKE 1 PUFF BY MOUTH TWICE A DAY 60 each 2  . albuterol (PROAIR HFA) 108 (90 Base) MCG/ACT inhaler USE 2 PUFFS EVERY 4 HOURS AS NEEDED FOR COUGH, WHEEZE OR SHORTNESS OF BREATH. 8.5 each 2  . ALPRAZolam (XANAX) 0.25 MG  tablet Take 1 tablet (0.25 mg total) by mouth 3 (three) times daily as needed. 30 tablet 2  . amLODipine (NORVASC) 5 MG tablet Take 1 tablet (5 mg total) by mouth daily. 30 tablet 3  . atenolol (TENORMIN) 25 MG tablet Take 0.5 tablets (12.5 mg total) by mouth daily. Takes 0.5 tablet 45 tablet 3  . Blood Glucose Monitoring Suppl (FREESTYLE FREEDOM) KIT 1 Device by Does not apply route daily. 1 each 0  . DEXILANT 60 MG capsule TAKE ONE CAPSULE BY MOUTH DAILY 90 capsule 0  . fexofenadine (ALLEGRA) 180 MG tablet Take 1 tablet (180 mg total) by mouth daily. 90 tablet 3  . Flaxseed, Linseed, (FLAXSEED OIL PO) Take by mouth. One capsule daily    . fluticasone (FLONASE) 50 MCG/ACT nasal spray PLACE 2 SPRAYS INTO BOTH NOSTRILS DAILY. 16 g 4  . FREESTYLE LITE test strip USE TO CHECK BLOOD SUGAR ONCE A DAY 100 each 0  . Iron Combinations (I.L.X. B-12) ELIX Take 1 each by mouth daily as needed (energy). 1 droplet full    . Multiple Vitamins-Minerals (MULTIVITAMIN WITH MINERALS) tablet Take 1 tablet by mouth daily.    . valACYclovir (VALTREX) 1000 MG tablet Take 1 tablet (1,000 mg total) by mouth 3 (three) times daily. (Patient taking differently: Take 1,000 mg by mouth 3 (three) times daily. As needed) 30 tablet 5   No current facility-administered medications on file prior to visit.      Objective:  Objective  Physical Exam  Constitutional: She is oriented to person, place, and time. She appears well-developed and well-nourished.  HENT:  Head: Normocephalic and atraumatic.  Eyes: Conjunctivae and EOM are normal.  Neck: Normal range of motion. Neck supple. No JVD present. Carotid bruit is not present. No thyromegaly present.  Cardiovascular: Normal rate, regular rhythm and normal heart sounds.  No murmur heard. Pulmonary/Chest: Effort normal and breath sounds normal. No respiratory distress. She has no wheezes. She has no rales. She exhibits no tenderness.  Musculoskeletal: She exhibits no edema.    Neurological: She is alert and oriented to person, place, and time.  Psychiatric: She has a normal mood and affect.  Nursing note and vitals reviewed.  BP 110/76 (BP Location: Right Arm, Cuff Size: Large)   Pulse 62   Temp 98 F (36.7 C) (Oral)   Resp 16   Ht _0  (1.651 m)   Wt 232 lb 6.4 oz (105.4 kg)   SpO2 98%   BMI 38.67 kg/m  Wt Readings from Last 3 Encounters:  12/16/17 232 lb 6.4 oz (105.4 kg)  12/12/17 226 lb (102.5 kg)  11/25/17 228 lb 12.8 oz (103.8 kg)     Lab Results  Component Value Date   WBC 4.5 12/12/2017   HGB 13.5 12/12/2017   HCT 41.8 12/12/2017   PLT 255.0 08/22/2017   GLUCOSE 88 12/12/2017   CHOL 196 12/12/2017   TRIG 94 12/12/2017   HDL 56 12/12/2017  LDLDIRECT 139.4 12/26/2011   LDLCALC 121 (H) 12/12/2017   ALT 30 12/12/2017   AST 25 12/12/2017   NA 144 12/12/2017   K 4.5 12/12/2017   CL 104 12/12/2017   CREATININE 0.82 12/12/2017   BUN 13 12/12/2017   CO2 27 12/12/2017   TSH 0.83 11/13/2017   INR 0.93 10/03/2016   HGBA1C 5.8 (H) 12/12/2017    Dg Chest 2 View  Result Date: 08/22/2017 CLINICAL DATA:  Cough, fever. EXAM: CHEST  2 VIEW COMPARISON:  Radiograph of October 14, 2016. FINDINGS: The heart size and mediastinal contours are within normal limits. Both lungs are clear. No pneumothorax or pleural effusion is noted. The visualized skeletal structures are unremarkable. IMPRESSION: No active cardiopulmonary disease. Electronically Signed   By: Marijo Conception, M.D.   On: 08/22/2017 16:09     Assessment & Plan:  Plan  I am having Emily Christians "[name]" maintain her acetaminophen, FREESTYLE FREEDOM, I.L.X. B-12, FREESTYLE LITE, albuterol, valACYclovir, (Flaxseed, Linseed, (FLAXSEED OIL PO)), atenolol, DEXILANT, fluticasone, ALPRAZolam, fexofenadine, amLODipine, ADVAIR DISKUS, multivitamin with minerals, and azelastine.  No orders of the defined types were placed in this encounter.   Problem List Items Addressed This Visit       Unprioritized   HTN (hypertension)    Hypotensive---  Dec norvasc to 2.5 mg daily F/u 2-3 week s for bp check       Hyperlipidemia LDL goal <100    Encouraged heart healthy diet, increase exercise, avoid trans fats, consider a krill oil cap daily         Follow-up: Return in about 6 months (around 06/17/2018), or if symptoms worsen or fail to improve, for hypertension, annual exam, fasting, hyperlipidemia.  [name], DO

## 2017-12-16 NOTE — Assessment & Plan Note (Signed)
Encouraged heart healthy diet, increase exercise, avoid trans fats, consider a krill oil cap daily 

## 2017-12-16 NOTE — Assessment & Plan Note (Signed)
Hypotensive---  Dec norvasc to 2.5 mg daily F/u 2-3 week s for bp check

## 2017-12-16 NOTE — Progress Notes (Signed)
Office: [phone]  /  Fax: [phone]   Dear Dr. Carollee Herter,   Thank you for referring Emily Osborne to our clinic. The following note includes my evaluation and treatment recommendations.  HPI:   Chief Complaint: OBESITY    Emily Osborne has been referred by [name], DO for consultation regarding her obesity and obesity related comorbidities.    Emily Osborne (MR# [id number]) is a 57 y.o. female who presents on 12/16/2017 for obesity evaluation and treatment. Current BMI is Body mass index is 37.61 kg/m.Emily Osborne Emily Osborne has been struggling with her weight for many years and has been unsuccessful in either losing weight, maintaining weight loss, or reaching her healthy weight goal. Emily Osborne's sister had bariatric surgery.     Emily Osborne attended our information session and states she is currently in the action stage of change and ready to dedicate time achieving and maintaining a healthier weight. Emily Osborne is interested in becoming our patient and working on intensive lifestyle modifications including (but not limited to) diet, exercise and weight loss.    Emily Osborne states her family eats meals together she thinks her family will eat healthier with  her she struggles with family and or coworkers weight loss sabotage her desired weight loss is 68 lbs she started gaining weight after having children/marriage her heaviest weight ever was 235 lbs. she has significant food cravings issues  she snacks frequently in the evenings she skips meals frequently she is frequently drinking liquids with calories she frequently makes poor food choices she has problems with excessive hunger  she frequently eats larger portions than normal  she has binge eating behaviors she struggles with emotional eating    Fatigue [name] feels her energy is lower than it should be. This has worsened with weight gain and has not worsened recently. [name] admits to daytime somnolence and admits to waking up  still tired. Patient is at risk for obstructive sleep apnea. Patent has a history of symptoms of daytime fatigue, morning fatigue and morning headache. Patient generally gets 5 or 6 hours of sleep per night, and states they generally have  restful sleep. Snoring is present. Apneic episodes are not present. Epworth Sleepiness Score is 3  EKG done on 11/26/17 was within normal limits. Emily Osborne had left heart cath in 2018.  Dyspnea on exertion Emily Osborne notes increasing shortness of breath with exercising and seems to be worsening over time with weight gain. She notes getting out of breath sooner with activity than she used to. This has not gotten worse recently. EKG done on 11/26/17 was within normal limits. Verdean had left heart cath in 2018. Emily Osborne denies orthopnea.  Hypothyroidism Emily Osborne has a diagnosis of hypothyroidism. She is managed by Dr. Cruzita Lederer. She is not on levothyroxine. She denies hot or cold intolerance or palpitations, but does admit to ongoing fatigue.  At risk for cardiovascular disease Emily Osborne is at a higher than average risk for cardiovascular disease due to obesity. She currently denies any chest pain.  Depression Screen Emily Osborne's Food and Mood (modified PHQ-9) score was  Depression screen PHQ 2/9 12/12/2017  Decreased Interest 1  Down, Depressed, Hopeless 1  PHQ - 2 Score 2  Altered sleeping 1  Tired, decreased energy 1  Change in appetite 2  Feeling bad or failure about yourself  1  Trouble concentrating 1  Moving slowly or fidgety/restless 0  Suicidal thoughts 0  PHQ-9 Score 8  Difficult doing work/chores Somewhat difficult  Some recent data might be hidden  ALLERGIES: Allergies  Allergen Reactions  . Aspirin Other (See Comments)    REACTION: anaphylaxis  . Gentamicin Other (See Comments)    Eye drops turned the sclera bright red  . Ibuprofen Other (See Comments)    REACTION: anaphylaxsis  . Metronidazole Swelling    REACTION: red face/swelling  . Nsaids Other  (See Comments)    REACTION: anaphylaxis  . Doxycycline Other (See Comments)    REACTION: severe nausea/vomiting  . Influenza A (H1n1) [place] Vac Other (See Comments)    REACTION: sick for 3 weeks  . Loratadine Other (See Comments)    Fatigue/weakness  . Periactin [Cyproheptadine] Other (See Comments)    paranoid   . Ranitidine Hcl Other (See Comments)    REACTION: Lips turned red /peel  . Sulfamethoxazole-Trimethoprim Other (See Comments)    REACTION: face red/peel  . Tramadol Hcl Other (See Comments)    REACTION: paranoid    MEDICATIONS: Current Outpatient Medications on File Prior to Visit  Medication Sig Dispense Refill  . acetaminophen (TYLENOL) 500 MG tablet Take 1,000 mg by mouth every 6 (six) hours as needed for moderate pain.     Emily Osborne ADVAIR DISKUS 250-50 MCG/DOSE AEPB TAKE 1 PUFF BY MOUTH TWICE A DAY 60 each 2  . albuterol (PROAIR HFA) 108 (90 Base) MCG/ACT inhaler USE 2 PUFFS EVERY 4 HOURS AS NEEDED FOR COUGH, WHEEZE OR SHORTNESS OF BREATH. 8.5 each 2  . ALPRAZolam (XANAX) 0.25 MG tablet Take 1 tablet (0.25 mg total) by mouth 3 (three) times daily as needed. 30 tablet 2  . amLODipine (NORVASC) 5 MG tablet Take 1 tablet (5 mg total) by mouth daily. 30 tablet 3  . atenolol (TENORMIN) 25 MG tablet Take 0.5 tablets (12.5 mg total) by mouth daily. Takes 0.5 tablet 45 tablet 3  . Blood Glucose Monitoring Suppl (FREESTYLE FREEDOM) KIT 1 Device by Does not apply route daily. 1 each 0  . DEXILANT 60 MG capsule TAKE ONE CAPSULE BY MOUTH DAILY 90 capsule 0  . fexofenadine (ALLEGRA) 180 MG tablet Take 1 tablet (180 mg total) by mouth daily. 90 tablet 3  . Flaxseed, Linseed, (FLAXSEED OIL PO) Take by mouth. One capsule daily    . fluticasone (FLONASE) 50 MCG/ACT nasal spray PLACE 2 SPRAYS INTO BOTH NOSTRILS DAILY. 16 g 4  . FREESTYLE LITE test strip USE TO CHECK BLOOD SUGAR ONCE A DAY 100 each 0  . Iron Combinations (I.L.X. B-12) ELIX Take 1 each by mouth daily as needed (energy). 1  droplet full    . Multiple Vitamins-Minerals (MULTIVITAMIN WITH MINERALS) tablet Take 1 tablet by mouth daily.    . valACYclovir (VALTREX) 1000 MG tablet Take 1 tablet (1,000 mg total) by mouth 3 (three) times daily. (Patient taking differently: Take 1,000 mg by mouth 3 (three) times daily. As needed) 30 tablet 5   No current facility-administered medications on file prior to visit.     PAST MEDICAL HISTORY: Past Medical History:  Diagnosis Date  . Allergy   . Anxiety   . Asthma    pt has inhaler  . Back pain   . C. difficile colitis   . Cataract    bil eyes  . Chest pain   . Chronic fatigue syndrome   . Clostridium difficile colitis 12/13/2016  . Diverticulitis   . Dyspnea   . Dysrhythmia    palpitations  . Edema   . Elevated blood pressure reading without diagnosis of hypertension    "just elevated when I'm in  pain" (12/07/2015)  . Fatty liver   . Fibromyalgia   . Ganglion of joint    right wrist  . GERD (gastroesophageal reflux disease)   . H. pylori infection   . Headache    hx of  . Helicobacter pylori (H. pylori) infection 12/13/2016  . Herpes zoster without mention of complication   . HTN (hypertension)   . Hyperlipidemia   . Hyperthyroidism   . IBS (irritable bowel syndrome)   . Leg edema   . Multiple drug allergies 06/18/2017  . Myalgia   . Nontraumatic rupture of Achilles tendon   . Other acute reactions to stress   . Pain in joint, shoulder region   . Pain in joint, upper arm   . Pain in limb   . Palpitations   . Personal history of other diseases of digestive system   . Pneumonia    once  . PONV (postoperative nausea and vomiting)   . Routine screening for STI (sexually transmitted infection) 12/13/2016  . S/P partial colectomy 06/18/2017  . Thyroid disease    hyperthyroidism  . Vitamin B 12 deficiency     PAST SURGICAL HISTORY: Past Surgical History:  Procedure Laterality Date  . ABDOMINAL HYSTERECTOMY    . ACHILLES TENDON REPAIR Right 2005    . BUNIONECTOMY Bilateral    Bunionectomy 1983  . COLON SURGERY  01/23/2017   6 to 8 inches sigmoid colon removed  . GANGLION CYST EXCISION Right    wrist  . LEFT HEART CATH AND CORONARY ANGIOGRAPHY N/A 10/03/2016   Procedure: Left Heart Cath and Coronary Angiography;  Surgeon: Burnell Blanks, MD;  Location: [facility];  Service: Cardiovascular;  Laterality: N/A;  . MENISCUS REPAIR Right 04/2014  . TUBAL LIGATION    . wisdomteeth extraction      SOCIAL HISTORY: Social History   Tobacco Use  . Smoking status: Never Smoker  . Smokeless tobacco: Never Used  Substance Use Topics  . Alcohol use: No    Alcohol/week: 0.0 oz  . Drug use: No    FAMILY HISTORY: Family History  Problem Relation Age of Onset  . Hypertension Father   . Diabetes Father   . Heart disease Father   . Kidney disease Father        Died, 80  . Hyperlipidemia Father   . Leukemia Brother   . Cancer Brother        myleoblastic anemia  . Hyperlipidemia Mother   . Hypertension Mother        Died, 78  . Thyroid disease Mother        Thyroid surgery  . Obesity Mother   . Heart disease Mother   . Pernicious anemia Sister   . Thyroid disease Sister        On thyroid Rx  . Lupus Daughter   . Coronary artery disease Brother   . Hypertension Brother   . Colon cancer Neg Hx   . Esophageal cancer Neg Hx   . Rectal cancer Neg Hx   . Stomach cancer Neg Hx   . Pancreatic cancer Neg Hx   . Prostate cancer Neg Hx     ROS: Review of Systems  Constitutional: Positive for malaise/fatigue.  Eyes:       Wear Glasses or Contacts  Respiratory: Positive for shortness of breath (on exertion).   Cardiovascular: Negative for chest pain, palpitations and orthopnea.  Musculoskeletal:       Muscle or Joint Pain  Neurological: Positive  for headaches.  Endo/Heme/Allergies:       Negative for heat or cold intolerance  Psychiatric/Behavioral:       Stress    PHYSICAL EXAM: Blood pressure 110/74,  pulse (!) 59, temperature 97.9 F (36.6 C), temperature source Oral, height _0  (1.651 m), weight 226 lb (102.5 kg), SpO2 98 %. Body mass index is 37.61 kg/m. Physical Exam  Constitutional: She is oriented to person, place, and time. She appears well-developed and well-nourished.  HENT:  Head: Normocephalic and atraumatic.  Nose: Nose normal.  Eyes: EOM are normal. No scleral icterus.  Neck: Normal range of motion. Neck supple. No thyromegaly present.  Cardiovascular: Regular rhythm. Bradycardia present.  Pulmonary/Chest: Effort normal. No respiratory distress.  Abdominal: Soft. There is no tenderness.  + obesity  Musculoskeletal: Normal range of motion.  Range of Motion normal in all 4 extremities  Neurological: She is alert and oriented to person, place, and time. Coordination normal.  Skin: Skin is warm and dry.  Psychiatric: She has a normal mood and affect. Her behavior is normal.  Vitals reviewed.   RECENT LABS AND TESTS: BMET    Component Value Date/Time   NA 144 12/12/2017 1102   K 4.5 12/12/2017 1102   CL 104 12/12/2017 1102   CO2 27 12/12/2017 1102   GLUCOSE 88 12/12/2017 1102   GLUCOSE 82 10/11/2017 1027   BUN 13 12/12/2017 1102   CREATININE 0.82 12/12/2017 1102   CREATININE 0.97 03/23/2016 1501   CALCIUM 9.9 12/12/2017 1102   GFRNONAA 80 12/12/2017 1102   GFRAA 92 12/12/2017 1102   Lab Results  Component Value Date   HGBA1C 5.8 (H) 12/12/2017   Lab Results  Component Value Date   INSULIN 17.6 12/12/2017   CBC    Component Value Date/Time   WBC 4.5 12/12/2017 1102   WBC 11.0 (H) 08/22/2017 1637   RBC 4.72 12/12/2017 1102   RBC 4.38 08/22/2017 1637   HGB 13.5 12/12/2017 1102   HCT 41.8 12/12/2017 1102   PLT 255.0 08/22/2017 1637   MCV 89 12/12/2017 1102   MCH 28.6 12/12/2017 1102   MCH 28.9 02/08/2017 0909   MCHC 32.3 12/12/2017 1102   MCHC 32.2 08/22/2017 1637   RDW 15.1 12/12/2017 1102   LYMPHSABS 2.3 12/12/2017 1102   MONOABS 0.5  04/04/2017 1045   EOSABS 0.1 12/12/2017 1102   BASOSABS 0.0 12/12/2017 1102   Iron/TIBC/Ferritin/ %Sat No results found for: IRON, TIBC, FERRITIN, IRONPCTSAT Lipid Panel     Component Value Date/Time   CHOL 196 12/12/2017 1102   TRIG 94 12/12/2017 1102   HDL 56 12/12/2017 1102   CHOLHDL 4 10/11/2017 1027   VLDL 20.6 10/11/2017 1027   LDLCALC 121 (H) 12/12/2017 1102   LDLDIRECT 139.4 12/26/2011 0905   Hepatic Function Panel     Component Value Date/Time   PROT 7.2 12/12/2017 1102   ALBUMIN 4.5 12/12/2017 1102   AST 25 12/12/2017 1102   ALT 30 12/12/2017 1102   ALKPHOS 76 12/12/2017 1102   BILITOT 0.5 12/12/2017 1102   BILIDIR 0.1 09/07/2014 1031      Component Value Date/Time   TSH 0.83 11/13/2017 0847   TSH 0.90 05/07/2017 1145   TSH 1.11 10/05/2016 1523   Results for Funchess, Luka W "[name]" (MRN [id number]) as of 12/16/2017 09:40  Ref. Range 12/15/2013 09:30  Vitamin D, 25-Hydroxy Latest Ref Range: 30 - 89 ng/mL 34    ECG  shows NSR with a rate of 56 BPM INDIRECT  CALORIMETER done today shows a VO2 of 184 and a REE of 1278.  Her calculated basal metabolic rate is 3532 thus her basal metabolic rate is worse than expected.    ASSESSMENT AND PLAN: Shortness of breath on exertion - Plan: CBC With Differential  Other fatigue - Plan: Vitamin B12, CBC With Differential, Comprehensive metabolic panel, Folate, Hemoglobin A1c, Lipid Panel With LDL/HDL Ratio, VITAMIN D 25 Hydroxy (Vit-D Deficiency, Fractures), Insulin, random  Hyperthyroidism  Depression screening  At risk for heart disease  Class 2 severe obesity with serious comorbidity and body mass index (BMI) of 37.0 to 37.9 in adult, unspecified obesity type (HCC)  PLAN: Fatigue Zailyn was informed that her fatigue may be related to obesity, depression or many other causes. Labs will be ordered, and in the meanwhile Ailea has agreed to work on diet, exercise and weight loss to help with fatigue. Proper sleep hygiene  was discussed including the need for 7-8 hours of quality sleep each night. A sleep study was not ordered based on symptoms and Epworth score. We will order indirect calorimetry.  Dyspnea on exertion Mavis's shortness of breath appears to be obesity related and exercise induced. She has agreed to work on weight loss and gradually increase exercise to treat her exercise induced shortness of breath. If Kyrah follows our instructions and loses weight without improvement of her shortness of breath, we will plan to refer to pulmonology. We will order labs and indirect calorimetry. We will monitor this condition regularly. Kiandria agrees to this plan.  Hypothyroidism Katisha was informed of the importance of good thyroid control to help with weight loss efforts. She was also informed that supertheraputic thyroid levels are dangerous and will not improve weight loss results. Thyroid panel was done on 11/14/17. Zareah is to follow up with Dr. Etter Sjogren and Dr. Cruzita Lederer.  Cardiovascular risk counseling Ellary was given extended (15 minutes) coronary artery disease prevention counseling today. She is 57 y.o. female and has risk factors for heart disease including obesity. We discussed intensive lifestyle modifications today with an emphasis on specific weight loss instructions and strategies. Pt was also informed of the importance of increasing exercise and decreasing saturated fats to help prevent heart disease.  Depression Screen Kameren had a mildly positive depression screening. Depression is commonly associated with obesity and often results in emotional eating behaviors. We will monitor this closely and work on CBT to help improve the non-hunger eating patterns. Referral to Psychology may be required if no improvement is seen as she continues in our clinic.  Obesity Anona is currently in the action stage of change and her goal is to continue with weight loss efforts. I recommend Atiana begin the structured  treatment plan as follows:  She has agreed to follow the Category 1 plan plus extra cup of vegetables Kaydra has been instructed to eventually work up to a goal of 150 minutes of combined cardio and strengthening exercise per week for weight loss and overall health benefits. We discussed the following Behavioral Modification Strategies today: no skipping meals, better snacking choices, planning for success, increasing lean protein intake, increasing vegetables and work on meal planning and easy cooking plans   She was informed of the importance of frequent follow up visits to maximize her success with intensive lifestyle modifications for her multiple health conditions. She was informed we would discuss her lab results at her next visit unless there is a critical issue that needs to be addressed sooner. Jaysha agreed to keep her next  visit at the agreed upon time to discuss these results.    OBESITY BEHAVIORAL INTERVENTION VISIT  Today's visit was # 1 out of 22.  Starting weight: 226 lbs Starting date: 12/12/17 Today's weight : 226 lbs Today's date: 12/12/2017 Total lbs lost to date: 0 (Patients must lose 7 lbs in the first 6 months to continue with counseling)   ASK: We discussed the diagnosis of obesity with Rudene Christians today and Kimmi agreed to give [place] permission to discuss obesity behavioral modification therapy today.  ASSESS: Chalon has the diagnosis of obesity and her BMI today is 37.61 Elesia is in the action stage of change   ADVISE: Vernice was educated on the multiple health risks of obesity as well as the benefit of weight loss to improve her health. She was advised of the need for long term treatment and the importance of lifestyle modifications.  AGREE: Multiple dietary modification options and treatment options were discussed and  Lener agreed to the above obesity treatment plan.   I, Doreene Nest, am acting as transcriptionist for  Eber Jones,  MD  I have reviewed the above documentation for accuracy and completeness, and I agree with the above. - Ilene Qua, MD

## 2017-12-16 NOTE — Patient Instructions (Signed)

## 2017-12-17 ENCOUNTER — Encounter: Payer: Self-pay | Admitting: Family Medicine

## 2017-12-26 ENCOUNTER — Ambulatory Visit (INDEPENDENT_AMBULATORY_CARE_PROVIDER_SITE_OTHER): Payer: BC Managed Care – PPO | Admitting: Family Medicine

## 2017-12-26 VITALS — BP 109/71 | HR 69 | Temp 98.1°F | Ht 65.0 in | Wt 225.0 lb

## 2017-12-26 DIAGNOSIS — R7303 Prediabetes: Secondary | ICD-10-CM

## 2017-12-26 DIAGNOSIS — E559 Vitamin D deficiency, unspecified: Secondary | ICD-10-CM | POA: Diagnosis not present

## 2017-12-26 DIAGNOSIS — Z9189 Other specified personal risk factors, not elsewhere classified: Secondary | ICD-10-CM | POA: Diagnosis not present

## 2017-12-26 DIAGNOSIS — E7849 Other hyperlipidemia: Secondary | ICD-10-CM | POA: Diagnosis not present

## 2017-12-26 DIAGNOSIS — Z6837 Body mass index (BMI) 37.0-37.9, adult: Secondary | ICD-10-CM | POA: Diagnosis not present

## 2017-12-26 DIAGNOSIS — I10 Essential (primary) hypertension: Secondary | ICD-10-CM | POA: Diagnosis not present

## 2017-12-26 MED ORDER — METFORMIN HCL 500 MG PO TABS
500.0000 mg | ORAL_TABLET | Freq: Every day | ORAL | 0 refills | Status: DC
Start: 2017-12-26 — End: 2018-01-15

## 2017-12-26 MED ORDER — VITAMIN D (ERGOCALCIFEROL) 1.25 MG (50000 UNIT) PO CAPS: 50000.0000 [IU] | ORAL_CAPSULE | ORAL | 0 refills | Status: DC

## 2017-12-30 NOTE — Progress Notes (Signed)
 Office: [phone]  /  Fax: [phone]   HPI:   Chief Complaint: OBESITY Emily Osborne is here to discuss her progress with her obesity treatment plan. She is on the Category 1 plan plus extra cup of vegetables and is following her eating plan approximately 90 % of the time. She states she is exercising 0 minutes 0 times per week. Emily Osborne found she was hungry after lunch. Not getting snack in daily.  Her weight is 225 lb (102.1 kg) today and has had a weight loss of 1 pound over a period of 2 weeks since her last visit. She has lost 1 lb since starting treatment with us.  Vitamin D Deficiency Emily Osborne has a diagnosis of vitamin D deficiency. She is not on Vit D supplement currently and denies nausea, vomiting or muscle weakness.  Pre-Diabetes Emily Osborne has a diagnosis of pre-diabetes based on her elevated Hgb A1c and was informed this puts her at greater risk of developing diabetes. Hgb A1c of 5.8 (elevated for >7 years) and she notes feeling occasional hypoglycemia. She is not taking metformin currently and continues to work on diet and exercise to decrease risk of diabetes.  At risk for diabetes Emily Osborne is at higher than average risk for developing diabetes due to her obesity and pre-diabetes. She currently denies polyuria or polydipsia.  Hypertension Emily Osborne is a 56 y.o. female with hypertension. Emily Osborne's primary care physician decreased amlodipine. Her blood pressure is slightly low today. She is working weight loss to help control her blood pressure with the goal of decreasing her risk of heart attack and stroke. Emily Osborne's blood pressure is not currently controlled.  Hyperlipidemia Emily Osborne has hyperlipidemia and has been trying to improve her cholesterol levels with intensive lifestyle modification including a low saturated fat diet, exercise and weight loss. LDL of 121 and she denies any chest pain, claudication or myalgias.  ALLERGIES: Allergies  Allergen Reactions  . Aspirin Other  (See Comments)    REACTION: anaphylaxis  . Gentamicin Other (See Comments)    Eye drops turned the sclera bright red  . Ibuprofen Other (See Comments)    REACTION: anaphylaxsis  . Metronidazole Swelling    REACTION: red face/swelling  . Nsaids Other (See Comments)    REACTION: anaphylaxis  . Doxycycline Other (See Comments)    REACTION: severe nausea/vomiting  . Influenza A (H1n1) Monoval Vac Other (See Comments)    REACTION: sick for 3 weeks  . Loratadine Other (See Comments)    Fatigue/weakness  . Periactin [Cyproheptadine] Other (See Comments)    paranoid   . Ranitidine Hcl Other (See Comments)    REACTION: Lips turned red /peel  . Sulfamethoxazole-Trimethoprim Other (See Comments)    REACTION: face red/peel  . Tramadol Hcl Other (See Comments)    REACTION: paranoid    MEDICATIONS: Current Outpatient Medications on File Prior to Visit  Medication Sig Dispense Refill  . acetaminophen (TYLENOL) 500 MG tablet Take 1,000 mg by mouth every 6 (six) hours as needed for moderate pain.     . ADVAIR DISKUS 250-50 MCG/DOSE AEPB TAKE 1 PUFF BY MOUTH TWICE A DAY 60 each 2  . albuterol (PROAIR HFA) 108 (90 Base) MCG/ACT inhaler USE 2 PUFFS EVERY 4 HOURS AS NEEDED FOR COUGH, WHEEZE OR SHORTNESS OF BREATH. 8.5 each 2  . ALPRAZolam (XANAX) 0.25 MG tablet Take 1 tablet (0.25 mg total) by mouth 3 (three) times daily as needed. 30 tablet 2  . amLODipine (NORVASC) 5 MG tablet Take 1 tablet (5   mg total) by mouth daily. (Patient taking differently: Take 2.5 mg by mouth daily. ) 30 tablet 3  . atenolol (TENORMIN) 25 MG tablet Take 0.5 tablets (12.5 mg total) by mouth daily. Takes 0.5 tablet 45 tablet 3  . azelastine (OPTIVAR) 0.05 % ophthalmic solution Place 1 drop into both eyes 2 (two) times daily. 6 mL 12  . Blood Glucose Monitoring Suppl (FREESTYLE FREEDOM) KIT 1 Device by Does not apply route daily. 1 each 0  . DEXILANT 60 MG capsule TAKE ONE CAPSULE BY MOUTH DAILY 90 capsule 0  . fexofenadine  (ALLEGRA) 180 MG tablet Take 1 tablet (180 mg total) by mouth daily. 90 tablet 3  . Flaxseed, Linseed, (FLAXSEED OIL PO) Take by mouth. One capsule daily    . fluticasone (FLONASE) 50 MCG/ACT nasal spray PLACE 2 SPRAYS INTO BOTH NOSTRILS DAILY. 16 g 4  . FREESTYLE LITE test strip USE TO CHECK BLOOD SUGAR ONCE A DAY 100 each 0  . Iron Combinations (I.L.X. B-12) ELIX Take 1 each by mouth daily as needed (energy). 1 droplet full    . Multiple Vitamins-Minerals (MULTIVITAMIN WITH MINERALS) tablet Take 1 tablet by mouth daily.    . valACYclovir (VALTREX) 1000 MG tablet Take 1 tablet (1,000 mg total) by mouth 3 (three) times daily. (Patient taking differently: Take 1,000 mg by mouth 3 (three) times daily. As needed) 30 tablet 5   No current facility-administered medications on file prior to visit.     PAST MEDICAL HISTORY: Past Medical History:  Diagnosis Date  . Allergy   . Anxiety   . Asthma    pt has inhaler  . Back pain   . C. difficile colitis   . Cataract    bil eyes  . Chest pain   . Chronic fatigue syndrome   . Clostridium difficile colitis 12/13/2016  . Diverticulitis   . Dyspnea   . Dysrhythmia    palpitations  . Edema   . Elevated blood pressure reading without diagnosis of hypertension    "just elevated when I'm in pain" (12/07/2015)  . Fatty liver   . Fibromyalgia   . Ganglion of joint    right wrist  . GERD (gastroesophageal reflux disease)   . H. pylori infection   . Headache    hx of  . Helicobacter pylori (H. pylori) infection 12/13/2016  . Herpes zoster without mention of complication   . HTN (hypertension)   . Hyperlipidemia   . Hyperthyroidism   . IBS (irritable bowel syndrome)   . Leg edema   . Multiple drug allergies 06/18/2017  . Myalgia   . Nontraumatic rupture of Achilles tendon   . Other acute reactions to stress   . Pain in joint, shoulder region   . Pain in joint, upper arm   . Pain in limb   . Palpitations   . Personal history of other  diseases of digestive system   . Pneumonia    once  . PONV (postoperative nausea and vomiting)   . Routine screening for STI (sexually transmitted infection) 12/13/2016  . S/P partial colectomy 06/18/2017  . Thyroid disease    hyperthyroidism  . Vitamin B 12 deficiency     PAST SURGICAL HISTORY: Past Surgical History:  Procedure Laterality Date  . ABDOMINAL HYSTERECTOMY    . ACHILLES TENDON REPAIR Right 2005  . BUNIONECTOMY Bilateral    Bunionectomy 1983  . COLON SURGERY  01/23/2017   6 to 8 inches sigmoid colon removed  . GANGLION  CYST EXCISION Right    wrist  . LEFT HEART CATH AND CORONARY ANGIOGRAPHY N/A 10/03/2016   Procedure: Left Heart Cath and Coronary Angiography;  Surgeon: Christopher D McAlhany, MD;  Location: [facility];  Service: Cardiovascular;  Laterality: N/A;  . MENISCUS REPAIR Right 04/2014  . TUBAL LIGATION    . wisdomteeth extraction      SOCIAL HISTORY: Social History   Tobacco Use  . Smoking status: Never Smoker  . Smokeless tobacco: Never Used  Substance Use Topics  . Alcohol use: No    Alcohol/week: 0.0 oz  . Drug use: No    FAMILY HISTORY: Family History  Problem Relation Age of Onset  . Hypertension Father   . Diabetes Father   . Heart disease Father   . Kidney disease Father        Died, 50  . Hyperlipidemia Father   . Leukemia Brother   . Cancer Brother        myleoblastic anemia  . Hyperlipidemia Mother   . Hypertension Mother        Died, 84  . Thyroid disease Mother        Thyroid surgery  . Obesity Mother   . Heart disease Mother   . Pernicious anemia Sister   . Thyroid disease Sister        On thyroid Rx  . Lupus Daughter   . Coronary artery disease Brother   . Hypertension Brother   . Colon cancer Neg Hx   . Esophageal cancer Neg Hx   . Rectal cancer Neg Hx   . Stomach cancer Neg Hx   . Pancreatic cancer Neg Hx   . Prostate cancer Neg Hx     ROS: Review of Systems  Constitutional: Positive for weight  loss.  Cardiovascular: Negative for chest pain and claudication.  Gastrointestinal: Negative for nausea and vomiting.  Genitourinary: Negative for frequency.  Musculoskeletal: Negative for myalgias.       Negative muscle weakness  Endo/Heme/Allergies: Negative for polydipsia.       Positive hypoglycemia    PHYSICAL EXAM: Blood pressure 109/71, pulse 69, temperature 98.1 F (36.7 C), temperature source Oral, height 5' 5" (1.651 m), weight 225 lb (102.1 kg), SpO2 99 %. Body mass index is 37.44 kg/m. Physical Exam  Constitutional: She is oriented to person, place, and time. She appears well-developed and well-nourished.  Cardiovascular: Normal rate.  Pulmonary/Chest: Effort normal.  Musculoskeletal: Normal range of motion.  Neurological: She is oriented to person, place, and time.  Skin: Skin is warm and dry.  Psychiatric: She has a normal mood and affect. Her behavior is normal.  Vitals reviewed.   RECENT LABS AND TESTS: BMET    Component Value Date/Time   NA 144 12/12/2017 1102   K 4.5 12/12/2017 1102   CL 104 12/12/2017 1102   CO2 27 12/12/2017 1102   GLUCOSE 88 12/12/2017 1102   GLUCOSE 82 10/11/2017 1027   BUN 13 12/12/2017 1102   CREATININE 0.82 12/12/2017 1102   CREATININE 0.97 03/23/2016 1501   CALCIUM 9.9 12/12/2017 1102   GFRNONAA 80 12/12/2017 1102   GFRAA 92 12/12/2017 1102   Lab Results  Component Value Date   HGBA1C 5.8 (H) 12/12/2017   HGBA1C 5.5 01/15/2017   HGBA1C 5.9 (H) 12/07/2015   HGBA1C 6.1 03/06/2011   HGBA1C 6.0 09/07/2010   Lab Results  Component Value Date   INSULIN 17.6 12/12/2017   CBC    Component Value Date/Time     WBC 4.5 12/12/2017 1102   WBC 11.0 (H) 08/22/2017 1637   RBC 4.72 12/12/2017 1102   RBC 4.38 08/22/2017 1637   HGB 13.5 12/12/2017 1102   HCT 41.8 12/12/2017 1102   PLT 255.0 08/22/2017 1637   MCV 89 12/12/2017 1102   MCH 28.6 12/12/2017 1102   MCH 28.9 02/08/2017 0909   MCHC 32.3 12/12/2017 1102   MCHC 32.2  08/22/2017 1637   RDW 15.1 12/12/2017 1102   LYMPHSABS 2.3 12/12/2017 1102   MONOABS 0.5 04/04/2017 1045   EOSABS 0.1 12/12/2017 1102   BASOSABS 0.0 12/12/2017 1102   Iron/TIBC/Ferritin/ %Sat No results found for: IRON, TIBC, FERRITIN, IRONPCTSAT Lipid Panel     Component Value Date/Time   CHOL 196 12/12/2017 1102   TRIG 94 12/12/2017 1102   HDL 56 12/12/2017 1102   CHOLHDL 4 10/11/2017 1027   VLDL 20.6 10/11/2017 1027   LDLCALC 121 (H) 12/12/2017 1102   LDLDIRECT 139.4 12/26/2011 0905   Hepatic Function Panel     Component Value Date/Time   PROT 7.2 12/12/2017 1102   ALBUMIN 4.5 12/12/2017 1102   AST 25 12/12/2017 1102   ALT 30 12/12/2017 1102   ALKPHOS 76 12/12/2017 1102   BILITOT 0.5 12/12/2017 1102   BILIDIR 0.1 09/07/2014 1031      Component Value Date/Time   TSH 0.83 11/13/2017 0847   TSH 0.90 05/07/2017 1145   TSH 1.11 10/05/2016 1523  Results for Emily Osborne, Emily Emily Osborne "[name]" (MRN [id number]) as of 12/30/2017 12:01  Ref. Range 12/12/2017 11:02  Vitamin D, 25-Hydroxy Latest Ref Range: 30.0 - 100.0 ng/mL 29.4 (L)    ASSESSMENT AND PLAN: Vitamin D deficiency - Plan: Vitamin D, Ergocalciferol, (DRISDOL) 50000 units CAPS capsule  Prediabetes - Plan: metFORMIN (GLUCOPHAGE) 500 MG tablet  Essential hypertension  Other hyperlipidemia  At risk for diabetes mellitus  Class 2 severe obesity with serious comorbidity and body mass index (BMI) of 37.0 to 37.9 in adult, unspecified obesity type ([place])  PLAN:  Vitamin D Deficiency Emily Osborne was informed that low vitamin D levels contributes to fatigue and are associated with obesity, breast, and colon cancer. Emily Osborne agrees to start prescription Vit D _0 ,000 IU every week #4 with no refills. She will follow up for routine testing of vitamin D, at least 2-3 times per year. She was informed of the risk of over-replacement of vitamin D and agrees to not increase her dose unless she discusses this with [place] first. Emily Osborne agrees to follow  up with our clinic in 2 weeks.  Pre-Diabetes Emily Osborne will continue to work on weight loss, exercise, and decreasing simple carbohydrates in her diet to help decrease the risk of diabetes. We dicussed metformin including benefits and risks. She was informed that eating too many simple carbohydrates or too many calories at one sitting increases the likelihood of GI side effects. Emily Osborne agrees to start metformin 500 mg PO q AM #30 with no refills. Emily Osborne agrees to follow up with our clinic in 2 weeks as directed to monitor her progress.  Diabetes risk counselling Emily Osborne was given extended (30 minutes) diabetes prevention counseling today. She is 57 y.o. female and has risk factors for diabetes including obesity and pre-diabetes. We discussed intensive lifestyle modifications today with an emphasis on weight loss as well as increasing exercise and decreasing simple carbohydrates in her diet.  Hypertension We discussed sodium restriction, working on healthy weight loss, and a regular exercise program as the means to achieve improved blood pressure control. Emily Osborne agreed  with this plan and agreed to follow up as directed. We will continue to monitor her blood pressure as well as her progress with the above lifestyle modifications. She will continue her medications as prescribed and will watch for signs of hypotension as she continues her lifestyle modifications. Emily Osborne agrees to follow up with our clinic in 2 weeks and we will recheck blood pressure at that time.  Hyperlipidemia Emily Osborne was informed of the American Heart Association Guidelines emphasizing intensive lifestyle modifications as the first line treatment for hyperlipidemia. We discussed many lifestyle modifications today in depth, and Emily Osborne will continue to work on decreasing saturated fats such as fatty red meat, butter and many fried foods. She will also increase vegetables and lean protein in her diet and continue to work on exercise and weight  loss efforts. We will recheck labs in 3 month and Emily Osborne agrees to follow up with our clinic in 2 weeks.  Obesity Emily Osborne is currently in the action stage of change. As such, her goal is to continue with weight loss efforts She has agreed to follow the Category 2 plan Emily Osborne has been instructed to work up to a goal of 150 minutes of combined cardio and strengthening exercise per week for weight loss and overall health benefits. We discussed the following Behavioral Modification Strategies today: increasing lean protein intake, work on meal planning and easy cooking plans, increase H20 intake, better snacking choices, and planning for success   Emily Osborne has agreed to follow up with our clinic in 2 weeks. She was informed of the importance of frequent follow up visits to maximize her success with intensive lifestyle modifications for her multiple health conditions.   OBESITY BEHAVIORAL INTERVENTION VISIT  Today's visit was # 2 out of 22.  Starting weight: 226 lbs Starting date: 12/12/17 Today's weight : 225 lbs Today's date: 12/26/2017 Total lbs lost to date: 1 (Patients must lose 7 lbs in the first 6 months to continue with counseling)   ASK: We discussed the diagnosis of obesity with Rudene Christians today and Keary agreed to give [place] permission to discuss obesity behavioral modification therapy today.  ASSESS: Valary has the diagnosis of obesity and her BMI today is 37.44 [place] is in the action stage of change   ADVISE: Icesis was educated on the multiple health risks of obesity as well as the benefit of weight loss to improve her health. She was advised of the need for long term treatment and the importance of lifestyle modifications.  AGREE: Multiple dietary modification options and treatment options were discussed and  Loise agreed to the above obesity treatment plan.  I, Trixie Dredge, am acting as transcriptionist for Ilene Qua, MD  I have reviewed the above  documentation for accuracy and completeness, and I agree with the above. - Ilene Qua, MD

## 2018-01-06 ENCOUNTER — Other Ambulatory Visit: Payer: Self-pay | Admitting: Gastroenterology

## 2018-01-08 ENCOUNTER — Encounter: Payer: Self-pay | Admitting: Family Medicine

## 2018-01-15 ENCOUNTER — Ambulatory Visit (INDEPENDENT_AMBULATORY_CARE_PROVIDER_SITE_OTHER): Payer: BC Managed Care – PPO | Admitting: Family Medicine

## 2018-01-15 VITALS — BP 107/69 | HR 68 | Temp 98.0°F | Ht 65.0 in | Wt 225.0 lb

## 2018-01-15 DIAGNOSIS — Z9189 Other specified personal risk factors, not elsewhere classified: Secondary | ICD-10-CM

## 2018-01-15 DIAGNOSIS — Z6837 Body mass index (BMI) 37.0-37.9, adult: Secondary | ICD-10-CM | POA: Diagnosis not present

## 2018-01-15 DIAGNOSIS — R7303 Prediabetes: Secondary | ICD-10-CM

## 2018-01-15 DIAGNOSIS — I10 Essential (primary) hypertension: Secondary | ICD-10-CM

## 2018-01-15 MED ORDER — METFORMIN HCL 500 MG PO TABS: 500.0000 mg | ORAL_TABLET | Freq: Every day | ORAL | 0 refills | Status: DC

## 2018-01-15 NOTE — Progress Notes (Signed)
Office: [phone]  /  Fax: [phone]   HPI:   Chief Complaint: OBESITY Shalin is here to discuss her progress with her obesity treatment plan. She is on the Category 2 plan and is following her eating plan approximately 80 % of the time. She states she is exercising 0 minutes 0 times per week. Lorah states the past two weekends she had travel and celebratory eating. Finds microwave meals not so filling. Birthday in a few days.  Her weight is 225 lb (102.1 kg) today and has not lost weight since her last visit. She has lost 1 lb since starting treatment with [place].  Hypertension CERRA EISENHOWER is a 57 y.o. female with hypertension. Allis's blood pressure is controlled today. She denies chest pain, chest pressure, or headache. She is working weight loss to help control her blood pressure with the goal of decreasing her risk of heart attack and stroke.  Pre-Diabetes Shelitha has a diagnosis of pre-diabetes based on her elevated Hgb A1c and was informed this puts her at greater risk of developing diabetes. She denies GI side effects on metformin, or carbohydrate cravings, and continues to work on diet and exercise to decrease risk of diabetes. She denies nausea or hypoglycemia.  At risk for diabetes Natanya is at higher than average risk for developing diabetes due to her obesity and pre-diabetes. She currently denies polyuria or polydipsia.  Vitamin D Deficiency Ronan has a diagnosis of vitamin D deficiency. She is currently taking prescription Vit D. She notes fatigue and denies nausea, vomiting or muscle weakness.  ALLERGIES: Allergies  Allergen Reactions  . Aspirin Other (See Comments)    REACTION: anaphylaxis  . Gentamicin Other (See Comments)    Eye drops turned the sclera bright red  . Ibuprofen Other (See Comments)    REACTION: anaphylaxsis  . Metronidazole Swelling    REACTION: red face/swelling  . Nsaids Other (See Comments)    REACTION: anaphylaxis  . Doxycycline  Other (See Comments)    REACTION: severe nausea/vomiting  . Influenza A (H1n1) Monoval Vac Other (See Comments)    REACTION: sick for 3 weeks  . Loratadine Other (See Comments)    Fatigue/weakness  . Periactin [Cyproheptadine] Other (See Comments)    paranoid   . Ranitidine Hcl Other (See Comments)    REACTION: Lips turned red /peel  . Sulfamethoxazole-Trimethoprim Other (See Comments)    REACTION: face red/peel  . Tramadol Hcl Other (See Comments)    REACTION: paranoid    MEDICATIONS: Current Outpatient Medications on File Prior to Visit  Medication Sig Dispense Refill  . acetaminophen (TYLENOL) 500 MG tablet Take 1,000 mg by mouth every 6 (six) hours as needed for moderate pain.     Marland Kitchen ADVAIR DISKUS 250-50 MCG/DOSE AEPB TAKE 1 PUFF BY MOUTH TWICE A DAY 60 each 2  . albuterol (PROAIR HFA) 108 (90 Base) MCG/ACT inhaler USE 2 PUFFS EVERY 4 HOURS AS NEEDED FOR COUGH, WHEEZE OR SHORTNESS OF BREATH. 8.5 each 2  . ALPRAZolam (XANAX) 0.25 MG tablet Take 1 tablet (0.25 mg total) by mouth 3 (three) times daily as needed. 30 tablet 2  . atenolol (TENORMIN) 25 MG tablet Take 0.5 tablets (12.5 mg total) by mouth daily. Takes 0.5 tablet 45 tablet 3  . azelastine (OPTIVAR) 0.05 % ophthalmic solution Place 1 drop into both eyes 2 (two) times daily. 6 mL 12  . Blood Glucose Monitoring Suppl (FREESTYLE FREEDOM) KIT 1 Device by Does not apply route daily. 1 each 0  .  DEXILANT 60 MG capsule TAKE ONE CAPSULE BY MOUTH DAILY 90 capsule 1  . fexofenadine (ALLEGRA) 180 MG tablet Take 1 tablet (180 mg total) by mouth daily. 90 tablet 3  . Flaxseed, Linseed, (FLAXSEED OIL PO) Take by mouth. One capsule daily    . fluorometholone (FML) 0.1 % ophthalmic suspension Place 1 drop into the right eye every 4 (four) hours.    . fluticasone (FLONASE) 50 MCG/ACT nasal spray PLACE 2 SPRAYS INTO BOTH NOSTRILS DAILY. 16 g 4  . FREESTYLE LITE test strip USE TO CHECK BLOOD SUGAR ONCE A DAY 100 each 0  . Iron Combinations  (I.L.X. B-12) ELIX Take 1 each by mouth daily as needed (energy). 1 droplet full    . metFORMIN (GLUCOPHAGE) 500 MG tablet Take 1 tablet (500 mg total) by mouth daily with breakfast. 30 tablet 0  . Multiple Vitamins-Minerals (MULTIVITAMIN WITH MINERALS) tablet Take 1 tablet by mouth daily.    . valACYclovir (VALTREX) 1000 MG tablet Take 1 tablet (1,000 mg total) by mouth 3 (three) times daily. (Patient taking differently: Take 1,000 mg by mouth 3 (three) times daily. As needed) 30 tablet 5  . Vitamin D, Ergocalciferol, (DRISDOL) 50000 units CAPS capsule Take 1 capsule (50,000 Units total) by mouth every 7 (seven) days. 4 capsule 0   No current facility-administered medications on file prior to visit.     PAST MEDICAL HISTORY: Past Medical History:  Diagnosis Date  . Allergy   . Anxiety   . Asthma    pt has inhaler  . Back pain   . C. difficile colitis   . Cataract    bil eyes  . Chest pain   . Chronic fatigue syndrome   . Clostridium difficile colitis 12/13/2016  . Diverticulitis   . Dyspnea   . Dysrhythmia    palpitations  . Edema   . Elevated blood pressure reading without diagnosis of hypertension    "just elevated when I'm in pain" (12/07/2015)  . Fatty liver   . Fibromyalgia   . Ganglion of joint    right wrist  . GERD (gastroesophageal reflux disease)   . H. pylori infection   . Headache    hx of  . Helicobacter pylori (H. pylori) infection 12/13/2016  . Herpes zoster without mention of complication   . HTN (hypertension)   . Hyperlipidemia   . Hyperthyroidism   . IBS (irritable bowel syndrome)   . Leg edema   . Multiple drug allergies 06/18/2017  . Myalgia   . Nontraumatic rupture of Achilles tendon   . Other acute reactions to stress   . Pain in joint, shoulder region   . Pain in joint, upper arm   . Pain in limb   . Palpitations   . Personal history of other diseases of digestive system   . Pneumonia    once  . PONV (postoperative nausea and vomiting)     . Routine screening for STI (sexually transmitted infection) 12/13/2016  . S/P partial colectomy 06/18/2017  . Thyroid disease    hyperthyroidism  . Vitamin B 12 deficiency     PAST SURGICAL HISTORY: Past Surgical History:  Procedure Laterality Date  . ABDOMINAL HYSTERECTOMY    . ACHILLES TENDON REPAIR Right 2005  . BUNIONECTOMY Bilateral    Bunionectomy 1983  . COLON SURGERY  01/23/2017   6 to 8 inches sigmoid colon removed  . GANGLION CYST EXCISION Right    wrist  . LEFT HEART CATH AND CORONARY ANGIOGRAPHY  N/A 10/03/2016   Procedure: Left Heart Cath and Coronary Angiography;  Surgeon: Burnell Blanks, MD;  Location: [facility];  Service: Cardiovascular;  Laterality: N/A;  . MENISCUS REPAIR Right 04/2014  . TUBAL LIGATION    . wisdomteeth extraction      SOCIAL HISTORY: Social History   Tobacco Use  . Smoking status: Never Smoker  . Smokeless tobacco: Never Used  Substance Use Topics  . Alcohol use: No    Alcohol/week: 0.0 oz  . Drug use: No    FAMILY HISTORY: Family History  Problem Relation Age of Onset  . Hypertension Father   . Diabetes Father   . Heart disease Father   . Kidney disease Father        Died, 41  . Hyperlipidemia Father   . Leukemia Brother   . Cancer Brother        myleoblastic anemia  . Hyperlipidemia Mother   . Hypertension Mother        Died, 34  . Thyroid disease Mother        Thyroid surgery  . Obesity Mother   . Heart disease Mother   . Pernicious anemia Sister   . Thyroid disease Sister        On thyroid Rx  . Lupus Daughter   . Coronary artery disease Brother   . Hypertension Brother   . Colon cancer Neg Hx   . Esophageal cancer Neg Hx   . Rectal cancer Neg Hx   . Stomach cancer Neg Hx   . Pancreatic cancer Neg Hx   . Prostate cancer Neg Hx     ROS: Review of Systems  Constitutional: Positive for malaise/fatigue. Negative for weight loss.  Cardiovascular: Negative for chest pain.       Negative chest  pressure  Gastrointestinal: Negative for nausea and vomiting.  Genitourinary: Negative for frequency.  Musculoskeletal:       Negative muscle weakness  Neurological: Negative for headaches.  Endo/Heme/Allergies: Negative for polydipsia.       Negative hypoglycemia    PHYSICAL EXAM: Blood pressure 107/69, pulse 68, temperature 98 F (36.7 C), temperature source Oral, height 5' 5"  (1.651 m), weight 225 lb (102.1 kg), SpO2 98 %. Body mass index is 37.44 kg/m. Physical Exam  Constitutional: She is oriented to person, place, and time. She appears well-developed and well-nourished.  Cardiovascular: Normal rate.  Pulmonary/Chest: Effort normal.  Musculoskeletal: Normal range of motion.  Neurological: She is oriented to person, place, and time.  Skin: Skin is warm and dry.  Psychiatric: She has a normal mood and affect. Her behavior is normal.  Vitals reviewed.   RECENT LABS AND TESTS: BMET    Component Value Date/Time   NA 144 12/12/2017 1102   K 4.5 12/12/2017 1102   CL 104 12/12/2017 1102   CO2 27 12/12/2017 1102   GLUCOSE 88 12/12/2017 1102   GLUCOSE 82 10/11/2017 1027   BUN 13 12/12/2017 1102   CREATININE 0.82 12/12/2017 1102   CREATININE 0.97 03/23/2016 1501   CALCIUM 9.9 12/12/2017 1102   GFRNONAA 80 12/12/2017 1102   GFRAA 92 12/12/2017 1102   Lab Results  Component Value Date   HGBA1C 5.8 (H) 12/12/2017   HGBA1C 5.5 01/15/2017   HGBA1C 5.9 (H) 12/07/2015   HGBA1C 6.1 03/06/2011   HGBA1C 6.0 09/07/2010   Lab Results  Component Value Date   INSULIN 17.6 12/12/2017   CBC    Component Value Date/Time   WBC 4.5 12/12/2017  1102   WBC 11.0 (H) 08/22/2017 1637   RBC 4.72 12/12/2017 1102   RBC 4.38 08/22/2017 1637   HGB 13.5 12/12/2017 1102   HCT 41.8 12/12/2017 1102   PLT 255.0 08/22/2017 1637   MCV 89 12/12/2017 1102   MCH 28.6 12/12/2017 1102   MCH 28.9 02/08/2017 0909   MCHC 32.3 12/12/2017 1102   MCHC 32.2 08/22/2017 1637   RDW 15.1 12/12/2017 1102     LYMPHSABS 2.3 12/12/2017 1102   MONOABS 0.5 04/04/2017 1045   EOSABS 0.1 12/12/2017 1102   BASOSABS 0.0 12/12/2017 1102   Iron/TIBC/Ferritin/ %Sat No results found for: IRON, TIBC, FERRITIN, IRONPCTSAT Lipid Panel     Component Value Date/Time   CHOL 196 12/12/2017 1102   TRIG 94 12/12/2017 1102   HDL 56 12/12/2017 1102   CHOLHDL 4 10/11/2017 1027   VLDL 20.6 10/11/2017 1027   LDLCALC 121 (H) 12/12/2017 1102   LDLDIRECT 139.4 12/26/2011 0905   Hepatic Function Panel     Component Value Date/Time   PROT 7.2 12/12/2017 1102   ALBUMIN 4.5 12/12/2017 1102   AST 25 12/12/2017 1102   ALT 30 12/12/2017 1102   ALKPHOS 76 12/12/2017 1102   BILITOT 0.5 12/12/2017 1102   BILIDIR 0.1 09/07/2014 1031      Component Value Date/Time   TSH 0.83 11/13/2017 0847   TSH 0.90 05/07/2017 1145   TSH 1.11 10/05/2016 1523  Results for Dority, Cheyann W "[name]" (MRN [id number]) as of 01/15/2018 09:52  Ref. Range 12/12/2017 11:02  Vitamin D, 25-Hydroxy Latest Ref Range: 30.0 - 100.0 ng/mL 29.4 (L)    ASSESSMENT AND PLAN: Essential hypertension  Prediabetes - Plan: metFORMIN (GLUCOPHAGE) 500 MG tablet  At risk for diabetes mellitus  Class 2 severe obesity with serious comorbidity and body mass index (BMI) of 37.0 to 37.9 in adult, unspecified obesity type ([place])  PLAN:  Hypertension We discussed sodium restriction, working on healthy weight loss, and a regular exercise program as the means to achieve improved blood pressure control. Meera agreed with this plan and agreed to follow up as directed. We will continue to monitor her blood pressure as well as her progress with the above lifestyle modifications. Kalen agrees to stop amlodipine and will watch for signs of hypotension as she continues her lifestyle modifications. Braniya agrees to follow up with our clinic in 2 weeks.  Pre-Diabetes Merlinda will continue to work on weight loss, exercise, and decreasing simple carbohydrates in her diet to  help decrease the risk of diabetes. We dicussed metformin including benefits and risks. She was informed that eating too many simple carbohydrates or too many calories at one sitting increases the likelihood of GI side effects. Ramiya agree to continue taking metformin 500 mg q AM #30 and we will refill for 1 month. Haliegh agrees to follow up with our clinic in 2 weeks as directed to monitor her progress.  Diabetes risk counselling Navie was given extended (15 minutes) diabetes prevention counseling today. She is 57 y.o. female and has risk factors for diabetes including obesity and pre-diabetes. We discussed intensive lifestyle modifications today with an emphasis on weight loss as well as increasing exercise and decreasing simple carbohydrates in her diet.  Vitamin D Deficiency Esti was informed that low vitamin D levels contributes to fatigue and are associated with obesity, breast, and colon cancer. Debanhi agrees to continue taking prescription Vit D @50 ,000 IU every week #4 and we will refill for 1 month. She will follow up for  routine testing of vitamin D, at least 2-3 times per year. She was informed of the risk of over-replacement of vitamin D and agrees to not increase her dose unless she discusses this with [place] first. Ayse agrees to follow up with our clinic in 2 weeks.  Obesity Alinda is currently in the action stage of change. As such, her goal is to continue with weight loss efforts She has agreed to follow the Category 2 plan + extra cup of vegetable at lunch Azalia has been instructed to work up to a goal of 150 minutes of combined cardio and strengthening exercise per week for weight loss and overall health benefits. We discussed the following Behavioral Modification Strategies today: increasing lean protein intake, increasing vegetables, work on meal planning and easy cooking plans, and planning for success   Chenise has agreed to follow up with our clinic in 2 weeks. She was  informed of the importance of frequent follow up visits to maximize her success with intensive lifestyle modifications for her multiple health conditions.   OBESITY BEHAVIORAL INTERVENTION VISIT  Today's visit was # 3 out of 22.  Starting weight: 226 lbs Starting date: 12/12/17 Today's weight : 225 lbs Today's date: 01/15/2018 Total lbs lost to date: 1 (Patients must lose 7 lbs in the first 6 months to continue with counseling)   ASK: We discussed the diagnosis of obesity with Rudene Christians today and Alexiss agreed to give [place] permission to discuss obesity behavioral modification therapy today.  ASSESS: Chrisanna has the diagnosis of obesity and her BMI today is 37.44 Jazzlin is in the action stage of change   ADVISE: Vasiliki was educated on the multiple health risks of obesity as well as the benefit of weight loss to improve her health. She was advised of the need for long term treatment and the importance of lifestyle modifications.  AGREE: Multiple dietary modification options and treatment options were discussed and  Makeisha agreed to the above obesity treatment plan.  I, Trixie Dredge, am acting as transcriptionist for Ilene Qua, MD  I have reviewed the above documentation for accuracy and completeness, and I agree with the above. - Ilene Qua, MD

## 2018-01-22 ENCOUNTER — Encounter (INDEPENDENT_AMBULATORY_CARE_PROVIDER_SITE_OTHER): Payer: Self-pay | Admitting: Family Medicine

## 2018-02-03 ENCOUNTER — Ambulatory Visit (INDEPENDENT_AMBULATORY_CARE_PROVIDER_SITE_OTHER): Payer: BC Managed Care – PPO | Admitting: Family Medicine

## 2018-02-03 VITALS — BP 117/76 | HR 65 | Temp 98.0°F | Ht 65.0 in | Wt 223.0 lb

## 2018-02-03 DIAGNOSIS — Z9189 Other specified personal risk factors, not elsewhere classified: Secondary | ICD-10-CM | POA: Diagnosis not present

## 2018-02-03 DIAGNOSIS — Z6837 Body mass index (BMI) 37.0-37.9, adult: Secondary | ICD-10-CM | POA: Diagnosis not present

## 2018-02-03 DIAGNOSIS — R7303 Prediabetes: Secondary | ICD-10-CM

## 2018-02-03 MED ORDER — METFORMIN HCL 500 MG PO TABS: 500.0000 mg | ORAL_TABLET | Freq: Every day | ORAL | 0 refills | Status: DC

## 2018-02-03 NOTE — Progress Notes (Signed)
Office: [phone]  /  Fax: [phone]   HPI:   Chief Complaint: OBESITY Emily Osborne is here to discuss her progress with her obesity treatment plan. She is on the Category 2 plan and is following her eating plan approximately 80 % of the time. She states she is walking for 30 minutes 3 times per week. Emily Osborne had birthday celebration and Memorial day celebration, which increased the difficulty of following the plan. Her weight is 223 lb (101.2 kg) today and has had a weight loss of 2 pounds over a period of 2 to 3 weeks since her last visit. She has lost 3 lbs since starting treatment with [place].  Vitamin D deficiency Emily Osborne has a diagnosis of vitamin D deficiency. Emily Osborne is currently taking vit D. Fatigue is improving and she denies nausea, vomiting or muscle weakness.  Pre-Diabetes Emily Osborne has a diagnosis of prediabetes based on her elevated Hgb A1c and was informed this puts her at greater risk of developing diabetes. Emily Osborne is still finding it hard to get all the food in. She is taking metformin currently and continues to work on diet and exercise to decrease risk of diabetes. She denies any cravings, nausea or hypoglycemia.  At risk for diabetes Emily Osborne is at higher than average risk for developing diabetes due to her obesity and pre-diabetes. She currently denies polyuria or polydipsia.  ALLERGIES: Allergies  Allergen Reactions  . Aspirin Other (See Comments)    REACTION: anaphylaxis  . Gentamicin Other (See Comments)    Eye drops turned the sclera bright red  . Ibuprofen Other (See Comments)    REACTION: anaphylaxsis  . Metronidazole Swelling    REACTION: red face/swelling  . Nsaids Other (See Comments)    REACTION: anaphylaxis  . Doxycycline Other (See Comments)    REACTION: severe nausea/vomiting  . Influenza A (H1n1) Mon[place] Vac Other (See Comments)    REACTION: sick for 3 weeks  . Loratadine Other (See Comments)    Fatigue/weakness  . Periactin [Cyproheptadine] Other  (See Comments)    paranoid   . Ranitidine Hcl Other (See Comments)    REACTION: Lips turned red /peel  . Sulfamethoxazole-Trimethoprim Other (See Comments)    REACTION: face red/peel  . Tramadol Hcl Other (See Comments)    REACTION: paranoid    MEDICATIONS: Current Outpatient Medications on File Prior to Visit  Medication Sig Dispense Refill  . acetaminophen (TYLENOL) 500 MG tablet Take 1,000 mg by mouth every 6 (six) hours as needed for moderate pain.     Marland Kitchen ADVAIR DISKUS 250-50 MCG/DOSE AEPB TAKE 1 PUFF BY MOUTH TWICE A DAY 60 each 2  . albuterol (PROAIR HFA) 108 (90 Base) MCG/ACT inhaler USE 2 PUFFS EVERY 4 HOURS AS NEEDED FOR COUGH, WHEEZE OR SHORTNESS OF BREATH. 8.5 each 2  . ALPRAZolam (XANAX) 0.25 MG tablet Take 1 tablet (0.25 mg total) by mouth 3 (three) times daily as needed. 30 tablet 2  . atenolol (TENORMIN) 25 MG tablet Take 0.5 tablets (12.5 mg total) by mouth daily. Takes 0.5 tablet 45 tablet 3  . azelastine (OPTIVAR) 0.05 % ophthalmic solution Place 1 drop into both eyes 2 (two) times daily. 6 mL 12  . Blood Glucose Monitoring Suppl (FREESTYLE FREEDOM) KIT 1 Device by Does not apply route daily. 1 each 0  . DEXILANT 60 MG capsule TAKE ONE CAPSULE BY MOUTH DAILY 90 capsule 1  . fexofenadine (ALLEGRA) 180 MG tablet Take 1 tablet (180 mg total) by mouth daily. 90 tablet 3  .  Flaxseed, Linseed, (FLAXSEED OIL PO) Take by mouth. One capsule daily    . fluorometholone (FML) 0.1 % ophthalmic suspension Place 1 drop into the right eye every 4 (four) hours.    . fluticasone (FLONASE) 50 MCG/ACT nasal spray PLACE 2 SPRAYS INTO BOTH NOSTRILS DAILY. 16 g 4  . FREESTYLE LITE test strip USE TO CHECK BLOOD SUGAR ONCE A DAY 100 each 0  . Iron Combinations (I.L.X. B-12) ELIX Take 1 each by mouth daily as needed (energy). 1 droplet full    . Multiple Vitamins-Minerals (MULTIVITAMIN WITH MINERALS) tablet Take 1 tablet by mouth daily.    . valACYclovir (VALTREX) 1000 MG tablet Take 1 tablet  (1,000 mg total) by mouth 3 (three) times daily. (Patient taking differently: Take 1,000 mg by mouth 3 (three) times daily. As needed) 30 tablet 5  . Vitamin D, Ergocalciferol, (DRISDOL) 50000 units CAPS capsule Take 1 capsule (50,000 Units total) by mouth every 7 (seven) days. 4 capsule 0   No current facility-administered medications on file prior to visit.     PAST MEDICAL HISTORY: Past Medical History:  Diagnosis Date  . Allergy   . Anxiety   . Asthma    pt has inhaler  . Back pain   . C. difficile colitis   . Cataract    bil eyes  . Chest pain   . Chronic fatigue syndrome   . Clostridium difficile colitis 12/13/2016  . Diverticulitis   . Dyspnea   . Dysrhythmia    palpitations  . Edema   . Elevated blood pressure reading without diagnosis of hypertension    "just elevated when I'm in pain" (12/07/2015)  . Fatty liver   . Fibromyalgia   . Ganglion of joint    right wrist  . GERD (gastroesophageal reflux disease)   . H. pylori infection   . Headache    hx of  . Helicobacter pylori (H. pylori) infection 12/13/2016  . Herpes zoster without mention of complication   . HTN (hypertension)   . Hyperlipidemia   . Hyperthyroidism   . IBS (irritable bowel syndrome)   . Leg edema   . Multiple drug allergies 06/18/2017  . Myalgia   . Nontraumatic rupture of Achilles tendon   . Other acute reactions to stress   . Pain in joint, shoulder region   . Pain in joint, upper arm   . Pain in limb   . Palpitations   . Personal history of other diseases of digestive system   . Pneumonia    once  . PONV (postoperative nausea and vomiting)   . Routine screening for STI (sexually transmitted infection) 12/13/2016  . S/P partial colectomy 06/18/2017  . Thyroid disease    hyperthyroidism  . Vitamin B 12 deficiency     PAST SURGICAL HISTORY: Past Surgical History:  Procedure Laterality Date  . ABDOMINAL HYSTERECTOMY    . ACHILLES TENDON REPAIR Right 2005  . BUNIONECTOMY Bilateral     Bunionectomy 1983  . COLON SURGERY  01/23/2017   6 to 8 inches sigmoid colon removed  . GANGLION CYST EXCISION Right    wrist  . LEFT HEART CATH AND CORONARY ANGIOGRAPHY N/A 10/03/2016   Procedure: Left Heart Cath and Coronary Angiography;  Surgeon: Burnell Blanks, MD;  Location: [facility];  Service: Cardiovascular;  Laterality: N/A;  . MENISCUS REPAIR Right 04/2014  . TUBAL LIGATION    . wisdomteeth extraction      SOCIAL HISTORY: Social History  Tobacco Use  . Smoking status: Never Smoker  . Smokeless tobacco: Never Used  Substance Use Topics  . Alcohol use: No    Alcohol/week: 0.0 oz  . Drug use: No    FAMILY HISTORY: Family History  Problem Relation Age of Onset  . Hypertension Father   . Diabetes Father   . Heart disease Father   . Kidney disease Father        Died, 32  . Hyperlipidemia Father   . Leukemia Brother   . Cancer Brother        myleoblastic anemia  . Hyperlipidemia Mother   . Hypertension Mother        Died, 83  . Thyroid disease Mother        Thyroid surgery  . Obesity Mother   . Heart disease Mother   . Pernicious anemia Sister   . Thyroid disease Sister        On thyroid Rx  . Lupus Daughter   . Coronary artery disease Brother   . Hypertension Brother   . Colon cancer Neg Hx   . Esophageal cancer Neg Hx   . Rectal cancer Neg Hx   . Stomach cancer Neg Hx   . Pancreatic cancer Neg Hx   . Prostate cancer Neg Hx     ROS: Review of Systems  Constitutional: Positive for malaise/fatigue and weight loss.  Gastrointestinal: Negative for nausea and vomiting.  Genitourinary: Negative for frequency.  Musculoskeletal:       Negative for muscle weakness  Endo/Heme/Allergies: Negative for polydipsia.       Negative for cravings Negative for hypoglycemia    PHYSICAL EXAM: Blood pressure 117/76, pulse 65, temperature 98 F (36.7 C), temperature source Oral, height 5' 5"  (1.651 m), weight 223 lb (101.2 kg), SpO2 99 %. Body  mass index is 37.11 kg/m. Physical Exam  Constitutional: She is oriented to person, place, and time. She appears well-developed and well-nourished.  Cardiovascular: Normal rate.  Pulmonary/Chest: Effort normal.  Musculoskeletal: Normal range of motion.  Neurological: She is oriented to person, place, and time.  Skin: Skin is warm and dry.  Psychiatric: She has a normal mood and affect. Her behavior is normal.  Vitals reviewed.   RECENT LABS AND TESTS: BMET    Component Value Date/Time   NA 144 12/12/2017 1102   K 4.5 12/12/2017 1102   CL 104 12/12/2017 1102   CO2 27 12/12/2017 1102   GLUCOSE 88 12/12/2017 1102   GLUCOSE 82 10/11/2017 1027   BUN 13 12/12/2017 1102   CREATININE 0.82 12/12/2017 1102   CREATININE 0.97 03/23/2016 1501   CALCIUM 9.9 12/12/2017 1102   GFRNONAA 80 12/12/2017 1102   GFRAA 92 12/12/2017 1102   Lab Results  Component Value Date   HGBA1C 5.8 (H) 12/12/2017   HGBA1C 5.5 01/15/2017   HGBA1C 5.9 (H) 12/07/2015   HGBA1C 6.1 03/06/2011   HGBA1C 6.0 09/07/2010   Lab Results  Component Value Date   INSULIN 17.6 12/12/2017   CBC    Component Value Date/Time   WBC 4.5 12/12/2017 1102   WBC 11.0 (H) 08/22/2017 1637   RBC 4.72 12/12/2017 1102   RBC 4.38 08/22/2017 1637   HGB 13.5 12/12/2017 1102   HCT 41.8 12/12/2017 1102   PLT 255.0 08/22/2017 1637   MCV 89 12/12/2017 1102   MCH 28.6 12/12/2017 1102   MCH 28.9 02/08/2017 0909   MCHC 32.3 12/12/2017 1102   MCHC 32.2 08/22/2017 1637   RDW 15.1  12/12/2017 1102   LYMPHSABS 2.3 12/12/2017 1102   MONOABS 0.5 04/04/2017 1045   EOSABS 0.1 12/12/2017 1102   BASOSABS 0.0 12/12/2017 1102   Iron/TIBC/Ferritin/ %Sat No results found for: IRON, TIBC, FERRITIN, IRONPCTSAT Lipid Panel     Component Value Date/Time   CHOL 196 12/12/2017 1102   TRIG 94 12/12/2017 1102   HDL 56 12/12/2017 1102   CHOLHDL 4 10/11/2017 1027   VLDL 20.6 10/11/2017 1027   LDLCALC 121 (H) 12/12/2017 1102   LDLDIRECT 139.4  12/26/2011 0905   Hepatic Function Panel     Component Value Date/Time   PROT 7.2 12/12/2017 1102   ALBUMIN 4.5 12/12/2017 1102   AST 25 12/12/2017 1102   ALT 30 12/12/2017 1102   ALKPHOS 76 12/12/2017 1102   BILITOT 0.5 12/12/2017 1102   BILIDIR 0.1 09/07/2014 1031      Component Value Date/Time   TSH 0.83 11/13/2017 0847   TSH 0.90 05/07/2017 1145   TSH 1.11 10/05/2016 1523   Results for [place], Willona W "[name]" (MRN [id number]) as of 02/03/2018 12:03  Ref. Range 12/12/2017 11:02  Vitamin D, 25-Hydroxy Latest Ref Range: 30.0 - 100.0 ng/mL 29.4 (L)   ASSESSMENT AND PLAN: Prediabetes - Plan: metFORMIN (GLUCOPHAGE) 500 MG tablet  At risk for diabetes mellitus  Class 2 severe obesity with serious comorbidity and body mass index (BMI) of 37.0 to 37.9 in adult, unspecified obesity type ([place])  PLAN:  Vitamin D Deficiency Emily Osborne was informed that low vitamin D levels contributes to fatigue and are associated with obesity, breast, and colon cancer. She agrees to continue to take prescription Vit D @50 ,000 IU every week (no refill needed) and will follow up for routine testing of vitamin D, at least 2-3 times per year. She was informed of the risk of over-replacement of vitamin D and agrees to not increase her dose unless she discusses this with [place] first.  Pre-Diabetes Emily Osborne will continue to work on weight loss, exercise, and decreasing simple carbohydrates in her diet to help decrease the risk of diabetes. We dicussed metformin including benefits and risks. She was informed that eating too many simple carbohydrates or too many calories at one sitting increases the likelihood of GI side effects. Emily Osborne requested metformin for now and a prescription was written today for 1 month refill. Emily Osborne agreed to follow up with [place] as directed to monitor her progress.  Diabetes risk counseling Emily Osborne was given extended (15 minutes) diabetes prevention counseling today. She is 57 y.o. female and has  risk factors for diabetes including obesity and pre-diabetes. We discussed intensive lifestyle modifications today with an emphasis on weight loss as well as increasing exercise and decreasing simple carbohydrates in her diet.  Obesity Emily Osborne is currently in the action stage of change. As such, her goal is to continue with weight loss efforts She has agreed to follow the Category 2 plan Emily Osborne has been instructed to work up to a goal of 150 minutes of combined cardio and strengthening exercise per week for weight loss and overall health benefits. We discussed the following Behavioral Modification Strategies today: planning for success, better snacking choices, increasing lean protein intake, increasing vegetables and work on meal planning and easy cooking plans  Emily Osborne has agreed to follow up with our clinic in 2 weeks. She was informed of the importance of frequent follow up visits to maximize her success with intensive lifestyle modifications for her multiple health conditions.   OBESITY BEHAVIORAL INTERVENTION VISIT  Today's visit was #  4 out of 22.  Starting weight: 226 lbs Starting date: 12/12/17 Today's weight : 223 lbs Today's date: 02/03/2018 Total lbs lost to date: 3 (Patients must lose 7 lbs in the first 6 months to continue with counseling)   ASK: We discussed the diagnosis of obesity with Emily Osborne today and Emily Osborne agreed to give [place] permission to discuss obesity behavioral modification therapy today.  ASSESS: Emily Osborne has the diagnosis of obesity and her BMI today is 37.11 Emily Osborne is in the action stage of change   ADVISE: Emily Osborne was educated on the multiple health risks of obesity as well as the benefit of weight loss to improve her health. She was advised of the need for long term treatment and the importance of lifestyle modifications.  AGREE: Multiple dietary modification options and treatment options were discussed and  Emily Osborne agreed to the above obesity treatment  plan.  I, Doreene Nest, am acting as transcriptionist for Eber Jones, MD  I have reviewed the above documentation for accuracy and completeness, and I agree with the above. - Ilene Qua, MD

## 2018-03-02 ENCOUNTER — Encounter (INDEPENDENT_AMBULATORY_CARE_PROVIDER_SITE_OTHER): Payer: Self-pay | Admitting: Family Medicine

## 2018-03-03 ENCOUNTER — Ambulatory Visit (INDEPENDENT_AMBULATORY_CARE_PROVIDER_SITE_OTHER): Payer: BC Managed Care – PPO | Admitting: Orthopedic Surgery

## 2018-03-03 ENCOUNTER — Encounter (INDEPENDENT_AMBULATORY_CARE_PROVIDER_SITE_OTHER): Payer: Self-pay

## 2018-03-03 ENCOUNTER — Ambulatory Visit (INDEPENDENT_AMBULATORY_CARE_PROVIDER_SITE_OTHER): Payer: BC Managed Care – PPO | Admitting: Family Medicine

## 2018-03-03 NOTE — Telephone Encounter (Signed)
Please cancel

## 2018-03-05 ENCOUNTER — Ambulatory Visit (INDEPENDENT_AMBULATORY_CARE_PROVIDER_SITE_OTHER): Payer: BC Managed Care – PPO | Admitting: Family Medicine

## 2018-03-05 VITALS — BP 128/83 | HR 55 | Temp 97.7°F | Ht 65.0 in | Wt 224.0 lb

## 2018-03-05 DIAGNOSIS — Z9189 Other specified personal risk factors, not elsewhere classified: Secondary | ICD-10-CM | POA: Diagnosis not present

## 2018-03-05 DIAGNOSIS — Z6837 Body mass index (BMI) 37.0-37.9, adult: Secondary | ICD-10-CM | POA: Diagnosis not present

## 2018-03-05 DIAGNOSIS — R7303 Prediabetes: Secondary | ICD-10-CM

## 2018-03-05 DIAGNOSIS — E559 Vitamin D deficiency, unspecified: Secondary | ICD-10-CM | POA: Diagnosis not present

## 2018-03-05 MED ORDER — VITAMIN D (ERGOCALCIFEROL) 1.25 MG (50000 UNIT) PO CAPS: 50000.0000 [IU] | ORAL_CAPSULE | ORAL | 0 refills | Status: DC

## 2018-03-05 NOTE — Progress Notes (Signed)
Office: [phone]  /  Fax: [phone]   HPI:   Chief Complaint: OBESITY Emily Osborne is here to discuss her progress with her obesity treatment plan. She is on the Category 2 plan and is following her eating plan approximately 40 % of the time. She states she is walking 30 to 45 minutes 1 to 2 times per week. Emily Osborne had a few deaths in the family and her daughter was in behavioral health hospital for 8 days.  Her weight is 224 lb (101.6 kg) today and has had a weight gain of 1 pound over a period of 4 weeks since her last visit. She has lost 2 lbs since starting treatment with [place].  Vitamin D deficiency Emily Osborne has a diagnosis of vitamin D deficiency. She is currently taking vit D. Emily Osborne admits fatigue and denies nausea, vomiting or muscle weakness.  At risk for osteopenia and osteoporosis Emily Osborne is at higher risk of osteopenia and osteoporosis due to vitamin D deficiency.   Pre-Diabetes Emily Osborne has a diagnosis of prediabetes based on her elevated Hgb A1c and was informed this puts her at greater risk of developing diabetes. Emily Osborne is still not feeling hungry most of the time and will skip meals. She  Denies any GI side effects of metformin. Emily Osborne continues to work on diet and exercise to decrease risk of diabetes. She denies nausea or hypoglycemia.   ALLERGIES: Allergies  Allergen Reactions  . Aspirin Other (See Comments)    REACTION: anaphylaxis  . Gentamicin Other (See Comments)    Eye drops turned the sclera bright red  . Ibuprofen Other (See Comments)    REACTION: anaphylaxsis  . Metronidazole Swelling    REACTION: red face/swelling  . Nsaids Other (See Comments)    REACTION: anaphylaxis  . Doxycycline Other (See Comments)    REACTION: severe nausea/vomiting  . Influenza A (H1n1) [place] Other (See Comments)    REACTION: sick for 3 weeks  . Loratadine Other (See Comments)    Fatigue/weakness  . Periactin [Cyproheptadine] Other (See Comments)    paranoid   .  Ranitidine Hcl Other (See Comments)    REACTION: Lips turned red /peel  . Sulfamethoxazole-Trimethoprim Other (See Comments)    REACTION: face red/peel  . Tramadol Hcl Other (See Comments)    REACTION: paranoid    MEDICATIONS: Current Outpatient Medications on File Prior to Visit  Medication Sig Dispense Refill  . acetaminophen (TYLENOL) 500 MG tablet Take 1,000 mg by mouth every 6 (six) hours as needed for moderate pain.     Emily Osborne Kitchen ADVAIR DISKUS 250-50 MCG/DOSE AEPB TAKE 1 PUFF BY MOUTH TWICE A DAY 60 each 2  . albuterol (PROAIR HFA) 108 (90 Base) MCG/ACT inhaler USE 2 PUFFS EVERY 4 HOURS AS NEEDED FOR COUGH, WHEEZE OR SHORTNESS OF BREATH. 8.5 each 2  . ALPRAZolam (XANAX) 0.25 MG tablet Take 1 tablet (0.25 mg total) by mouth 3 (three) times daily as needed. 30 tablet 2  . atenolol (TENORMIN) 25 MG tablet Take 0.5 tablets (12.5 mg total) by mouth daily. Takes 0.5 tablet 45 tablet 3  . azelastine (OPTIVAR) 0.05 % ophthalmic solution Place 1 drop into both eyes 2 (two) times daily. 6 mL 12  . Blood Glucose Monitoring Suppl (FREESTYLE FREEDOM) KIT 1 Device by Does not apply route daily. 1 each 0  . DEXILANT 60 MG capsule TAKE ONE CAPSULE BY MOUTH DAILY 90 capsule 1  . fexofenadine (ALLEGRA) 180 MG tablet Take 1 tablet (180 mg total) by mouth daily. [place]  tablet 3  . Flaxseed, Linseed, (FLAXSEED OIL PO) Take by mouth. One capsule daily    . fluorometholone (FML) 0.1 % ophthalmic suspension Place 1 drop into the right eye every 4 (four) hours.    . fluticasone (FLONASE) 50 MCG/ACT nasal spray PLACE 2 SPRAYS INTO BOTH NOSTRILS DAILY. 16 g 4  . FREESTYLE LITE test strip USE TO CHECK BLOOD SUGAR ONCE A DAY 100 each 0  . Iron Combinations (I.L.X. B-12) ELIX Take 1 each by mouth daily as needed (energy). 1 droplet full    . metFORMIN (GLUCOPHAGE) 500 MG tablet Take 1 tablet (500 mg total) by mouth daily with breakfast. 30 tablet 0  . Multiple Vitamins-Minerals (MULTIVITAMIN WITH MINERALS) tablet Take 1  tablet by mouth daily.    . valACYclovir (VALTREX) 1000 MG tablet Take 1 tablet (1,000 mg total) by mouth 3 (three) times daily. (Patient taking differently: Take 1,000 mg by mouth 3 (three) times daily. As needed) 30 tablet 5   No current facility-administered medications on file prior to visit.     PAST MEDICAL HISTORY: Past Medical History:  Diagnosis Date  . Allergy   . Anxiety   . Asthma    pt has inhaler  . Back pain   . C. difficile colitis   . Cataract    bil eyes  . Chest pain   . Chronic fatigue syndrome   . Clostridium difficile colitis 12/13/2016  . Diverticulitis   . Dyspnea   . Dysrhythmia    palpitations  . Edema   . Elevated blood pressure reading without diagnosis of hypertension    "just elevated when I'm in pain" (12/07/2015)  . Fatty liver   . Fibromyalgia   . Ganglion of joint    right wrist  . GERD (gastroesophageal reflux disease)   . H. pylori infection   . Headache    hx of  . Helicobacter pylori (H. pylori) infection 12/13/2016  . Herpes zoster without mention of complication   . HTN (hypertension)   . Hyperlipidemia   . Hyperthyroidism   . IBS (irritable bowel syndrome)   . Leg edema   . Multiple drug allergies 06/18/2017  . Myalgia   . Nontraumatic rupture of Achilles tendon   . Other acute reactions to stress   . Pain in joint, shoulder region   . Pain in joint, upper arm   . Pain in limb   . Palpitations   . Personal history of other diseases of digestive system   . Pneumonia    once  . PONV (postoperative nausea and vomiting)   . Routine screening for STI (sexually transmitted infection) 12/13/2016  . S/P partial colectomy 06/18/2017  . Thyroid disease    hyperthyroidism  . Vitamin B 12 deficiency     PAST SURGICAL HISTORY: Past Surgical History:  Procedure Laterality Date  . ABDOMINAL HYSTERECTOMY    . ACHILLES TENDON REPAIR Right 2005  . BUNIONECTOMY Bilateral    Bunionectomy 1983  . COLON SURGERY  01/23/2017   6 to 8  inches sigmoid colon removed  . GANGLION CYST EXCISION Right    wrist  . LEFT HEART CATH AND CORONARY ANGIOGRAPHY N/A 10/03/2016   Procedure: Left Heart Cath and Coronary Angiography;  Surgeon: Burnell Blanks, MD;  Location: [facility];  Service: Cardiovascular;  Laterality: N/A;  . MENISCUS REPAIR Right 04/2014  . TUBAL LIGATION    . wisdomteeth extraction      SOCIAL HISTORY: Social History  Tobacco Use  . Smoking status: Never Smoker  . Smokeless tobacco: Never Used  Substance Use Topics  . Alcohol use: No    Alcohol/week: 0.0 oz  . Drug use: No    FAMILY HISTORY: Family History  Problem Relation Age of Onset  . Hypertension Father   . Diabetes Father   . Heart disease Father   . Kidney disease Father        Died, 16  . Hyperlipidemia Father   . Leukemia Brother   . Cancer Brother        myleoblastic anemia  . Hyperlipidemia Mother   . Hypertension Mother        Died, 26  . Thyroid disease Mother        Thyroid surgery  . Obesity Mother   . Heart disease Mother   . Pernicious anemia Sister   . Thyroid disease Sister        On thyroid Rx  . Lupus Daughter   . Coronary artery disease Brother   . Hypertension Brother   . Colon cancer Neg Hx   . Esophageal cancer Neg Hx   . Rectal cancer Neg Hx   . Stomach cancer Neg Hx   . Pancreatic cancer Neg Hx   . Prostate cancer Neg Hx     ROS: Review of Systems  Constitutional: Positive for malaise/fatigue. Negative for weight loss.  Gastrointestinal: Negative for nausea and vomiting.  Musculoskeletal:       Negative for muscle weakness  Endo/Heme/Allergies:       Negative for hypoglycemia    PHYSICAL EXAM: Blood pressure 128/83, pulse (!) 55, temperature 97.7 F (36.5 C), temperature source Oral, height 5' 5"  (1.651 m), weight 224 lb (101.6 kg), SpO2 99 %. Body mass index is 37.28 kg/m. Physical Exam  Constitutional: She is oriented to person, place, and time. She appears well-developed and  well-nourished.  Cardiovascular: Normal rate.  Pulmonary/Chest: Effort normal.  Musculoskeletal: Normal range of motion.  Neurological: She is oriented to person, place, and time.  Skin: Skin is warm and dry.  Psychiatric: She has a normal mood and affect. Her behavior is normal.  Vitals reviewed.   RECENT LABS AND TESTS: BMET    Component Value Date/Time   NA 144 12/12/2017 1102   K 4.5 12/12/2017 1102   CL 104 12/12/2017 1102   CO2 27 12/12/2017 1102   GLUCOSE 88 12/12/2017 1102   GLUCOSE 82 10/11/2017 1027   BUN 13 12/12/2017 1102   CREATININE 0.82 12/12/2017 1102   CREATININE 0.97 03/23/2016 1501   CALCIUM 9.9 12/12/2017 1102   GFRNONAA 80 12/12/2017 1102   GFRAA 92 12/12/2017 1102   Lab Results  Component Value Date   HGBA1C 5.8 (H) 12/12/2017   HGBA1C 5.5 01/15/2017   HGBA1C 5.9 (H) 12/07/2015   HGBA1C 6.1 03/06/2011   HGBA1C 6.0 09/07/2010   Lab Results  Component Value Date   INSULIN 17.6 12/12/2017   CBC    Component Value Date/Time   WBC 4.5 12/12/2017 1102   WBC 11.0 (H) 08/22/2017 1637   RBC 4.72 12/12/2017 1102   RBC 4.38 08/22/2017 1637   HGB 13.5 12/12/2017 1102   HCT 41.8 12/12/2017 1102   PLT 255.0 08/22/2017 1637   MCV 89 12/12/2017 1102   MCH 28.6 12/12/2017 1102   MCH 28.9 02/08/2017 0909   MCHC 32.3 12/12/2017 1102   MCHC 32.2 08/22/2017 1637   RDW 15.1 12/12/2017 1102   LYMPHSABS 2.3 12/12/2017 1102  MONOABS 0.5 04/04/2017 1045   EOSABS 0.1 12/12/2017 1102   BASOSABS 0.0 12/12/2017 1102   Iron/TIBC/Ferritin/ %Sat No results found for: IRON, TIBC, FERRITIN, IRONPCTSAT Lipid Panel     Component Value Date/Time   CHOL 196 12/12/2017 1102   TRIG 94 12/12/2017 1102   HDL 56 12/12/2017 1102   CHOLHDL 4 10/11/2017 1027   VLDL 20.6 10/11/2017 1027   LDLCALC 121 (H) 12/12/2017 1102   LDLDIRECT 139.4 12/26/2011 0905   Hepatic Function Panel     Component Value Date/Time   PROT 7.2 12/12/2017 1102   ALBUMIN 4.5 12/12/2017 1102     AST 25 12/12/2017 1102   ALT 30 12/12/2017 1102   ALKPHOS 76 12/12/2017 1102   BILITOT 0.5 12/12/2017 1102   BILIDIR 0.1 09/07/2014 1031      Component Value Date/Time   TSH 0.83 11/13/2017 0847   TSH 0.90 05/07/2017 1145   TSH 1.11 10/05/2016 1523   Results for Abreu, Fabienne W "[name]" (MRN [id number]) as of 03/05/2018 15:00  Ref. Range 12/12/2017 11:02  Vitamin D, 25-Hydroxy Latest Ref Range: 30.0 - 100.0 ng/mL 29.4 (L)   ASSESSMENT AND PLAN: Vitamin D deficiency - Plan: Vitamin D, Ergocalciferol, (DRISDOL) 50000 units CAPS capsule  Prediabetes  At risk for osteoporosis  Class 2 severe obesity with serious comorbidity and body mass index (BMI) of 37.0 to 37.9 in adult, unspecified obesity type ([place])  PLAN:  Vitamin D Deficiency Briyonna was informed that low vitamin D levels contributes to fatigue and are associated with obesity, breast, and colon cancer. She agrees to continue to take prescription Vit D @50 ,000 IU every week #4 with no refills and will follow up for routine testing of vitamin D, at least 2-3 times per year. She was informed of the risk of over-replacement of vitamin D and agrees to not increase her dose unless she discusses this with [place] first. Emily Osborne agrees to follow up with our clinic in 2 weeks.  At risk for osteopenia and osteoporosis Emily Osborne is at risk for osteopenia and osteoporosis due to her vitamin D deficiency. She was encouraged to take her vitamin D and follow her higher calcium diet and increase strengthening exercise to help strengthen her bones and decrease her risk of osteopenia and osteoporosis.  Pre-Diabetes Emily Osborne will continue to work on weight loss, exercise, and decreasing simple carbohydrates in her diet to help decrease the risk of diabetes. We dicussed metformin including benefits and risks. She was informed that eating too many simple carbohydrates or too many calories at one sitting increases the likelihood of GI side effects. Emily Osborne will  continue metformin for now and a prescription was not written today. We will repeat labs at the next appointment. Emily Osborne agreed to follow up with [place] as directed to monitor her progress.  Obesity Emily Osborne is currently in the action stage of change. As such, her goal is to continue with weight loss efforts She has agreed to follow the Category 2 plan Emily Osborne has been instructed to work up to a goal of 150 minutes of combined cardio and strengthening exercise per week for weight loss and overall health benefits. We discussed the following Behavioral Modification Strategies today: planning for success, increasing lean protein intake, increasing vegetables and work on meal planning and easy cooking plans  Emily Osborne has agreed to follow up with our clinic in 2 weeks. She was informed of the importance of frequent follow up visits to maximize her success with intensive lifestyle modifications for her multiple health  conditions.   OBESITY BEHAVIORAL INTERVENTION VISIT  Today's visit was # 5 out of 22.  Starting weight: 226 lbs Starting date: 12/12/17 Today's weight : 224 lbs  Today's date: 03/05/2018 Total lbs lost to date: 2    ASK: We discussed the diagnosis of obesity with Emily Osborne today and Emily Osborne agreed to give [place] permission to discuss obesity behavioral modification therapy today.  ASSESS: Emily Osborne has the diagnosis of obesity and her BMI today is 37.28 Emily Osborne is in the action stage of change   ADVISE: Emily Osborne was educated on the multiple health risks of obesity as well as the benefit of weight loss to improve her health. She was advised of the need for long term treatment and the importance of lifestyle modifications.  AGREE: Multiple dietary modification options and treatment options were discussed and  Emily Osborne agreed to the above obesity treatment plan.  I, Doreene Nest, am acting as transcriptionist for Eber Jones, MD  I have reviewed the above documentation for  accuracy and completeness, and I agree with the above. - Ilene Qua, MD

## 2018-03-13 ENCOUNTER — Other Ambulatory Visit: Payer: Self-pay | Admitting: Family Medicine

## 2018-03-13 DIAGNOSIS — I10 Essential (primary) hypertension: Secondary | ICD-10-CM

## 2018-03-20 ENCOUNTER — Ambulatory Visit (INDEPENDENT_AMBULATORY_CARE_PROVIDER_SITE_OTHER): Payer: BC Managed Care – PPO | Admitting: Physician Assistant

## 2018-03-20 VITALS — BP 108/75 | HR 62 | Temp 98.3°F | Ht 65.0 in | Wt 223.0 lb

## 2018-03-20 DIAGNOSIS — R7303 Prediabetes: Secondary | ICD-10-CM | POA: Diagnosis not present

## 2018-03-20 DIAGNOSIS — E66812 Obesity, class 2: Secondary | ICD-10-CM

## 2018-03-20 DIAGNOSIS — E559 Vitamin D deficiency, unspecified: Secondary | ICD-10-CM | POA: Diagnosis not present

## 2018-03-20 DIAGNOSIS — Z9189 Other specified personal risk factors, not elsewhere classified: Secondary | ICD-10-CM | POA: Diagnosis not present

## 2018-03-20 DIAGNOSIS — E7849 Other hyperlipidemia: Secondary | ICD-10-CM | POA: Diagnosis not present

## 2018-03-20 DIAGNOSIS — Z6837 Body mass index (BMI) 37.0-37.9, adult: Secondary | ICD-10-CM

## 2018-03-20 MED ORDER — METFORMIN HCL 500 MG PO TABS: 500.0000 mg | ORAL_TABLET | Freq: Every day | ORAL | 0 refills | Status: DC

## 2018-03-20 NOTE — Progress Notes (Signed)
Office: [phone]  /  Fax: [phone]   HPI:   Chief Complaint: OBESITY Emily Osborne is here to discuss her progress with her obesity treatment plan. She is on the Category 2 plan and is following her eating plan approximately 50 % of the time. She states she is walking for 45 minutes 1 times per week. Emily Osborne did well with weight loss. She reports some stress eating recently with some family issues. She states that she has been practicing mindful eating.  Her weight is 223 lb (101.2 kg) today and has had a weight loss of 1 pound over a period of 2 weeks since her last visit. She has lost 3 lbs since starting treatment with [place].  Pre-Diabetes Emily Osborne has a diagnosis of pre-diabetes based on her elevated Hgb A1c and was informed this puts her at greater risk of developing diabetes. She is taking metformin currently and continues to work on diet and exercise to decrease risk of diabetes. She denies hypoglycemia, nausea, vomiting, or diarrhea.  At risk for diabetes Emily Osborne is at higher than average risk for developing diabetes due to her obesity and pre-diabetes. She currently denies polyuria or polydipsia.  Vitamin D Deficiency Emily Osborne has a diagnosis of vitamin D deficiency. She is on prescription Vit D, last level not at goal. She denies nausea, vomiting or muscle weakness.  Hyperlipidemia Emily Osborne has hyperlipidemia and has been trying to improve her cholesterol levels with intensive lifestyle modification including a low saturated fat diet, exercise and weight loss. She denies any chest pain, claudication or myalgias. She is not on medications, last labs not at goal.  ALLERGIES: Allergies  Allergen Reactions  . Aspirin Other (See Comments)    REACTION: anaphylaxis  . Gentamicin Other (See Comments)    Eye drops turned the sclera bright red  . Ibuprofen Other (See Comments)    REACTION: anaphylaxsis  . Metronidazole Swelling    REACTION: red face/swelling  . Nsaids Other (See Comments)      REACTION: anaphylaxis  . Doxycycline Other (See Comments)    REACTION: severe nausea/vomiting  . Influenza A (H1n1) Mon[place] Vac Other (See Comments)    REACTION: sick for 3 weeks  . Loratadine Other (See Comments)    Fatigue/weakness  . Periactin [Cyproheptadine] Other (See Comments)    paranoid   . Ranitidine Hcl Other (See Comments)    REACTION: Lips turned red /peel  . Sulfamethoxazole-Trimethoprim Other (See Comments)    REACTION: face red/peel  . Tramadol Hcl Other (See Comments)    REACTION: paranoid    MEDICATIONS: Current Outpatient Medications on File Prior to Visit  Medication Sig Dispense Refill  . acetaminophen (TYLENOL) 500 MG tablet Take 1,000 mg by mouth every 6 (six) hours as needed for moderate pain.     Marland Kitchen ADVAIR DISKUS 250-50 MCG/DOSE AEPB TAKE 1 PUFF BY MOUTH TWICE A DAY 60 each 2  . albuterol (PROAIR HFA) 108 (90 Base) MCG/ACT inhaler USE 2 PUFFS EVERY 4 HOURS AS NEEDED FOR COUGH, WHEEZE OR SHORTNESS OF BREATH. 8.5 each 2  . ALPRAZolam (XANAX) 0.25 MG tablet Take 1 tablet (0.25 mg total) by mouth 3 (three) times daily as needed. 30 tablet 2  . atenolol (TENORMIN) 25 MG tablet Take 0.5 tablets (12.5 mg total) by mouth daily. Takes 0.5 tablet 45 tablet 3  . azelastine (OPTIVAR) 0.05 % ophthalmic solution Place 1 drop into both eyes 2 (two) times daily. 6 mL 12  . Blood Glucose Monitoring Suppl (FREESTYLE FREEDOM) KIT 1 Device by  Does not apply route daily. 1 each 0  . DEXILANT 60 MG capsule TAKE ONE CAPSULE BY MOUTH DAILY 90 capsule 1  . fexofenadine (ALLEGRA) 180 MG tablet Take 1 tablet (180 mg total) by mouth daily. 90 tablet 3  . Flaxseed, Linseed, (FLAXSEED OIL PO) Take by mouth. One capsule daily    . fluorometholone (FML) 0.1 % ophthalmic suspension Place 1 drop into the right eye every 4 (four) hours.    . fluticasone (FLONASE) 50 MCG/ACT nasal spray PLACE 2 SPRAYS INTO BOTH NOSTRILS DAILY. 16 g 4  . FREESTYLE LITE test strip USE TO CHECK BLOOD SUGAR ONCE  A DAY 100 each 0  . Iron Combinations (I.L.X. B-12) ELIX Take 1 each by mouth daily as needed (energy). 1 droplet full    . Multiple Vitamins-Minerals (MULTIVITAMIN WITH MINERALS) tablet Take 1 tablet by mouth daily.    . valACYclovir (VALTREX) 1000 MG tablet Take 1 tablet (1,000 mg total) by mouth 3 (three) times daily. (Patient taking differently: Take 1,000 mg by mouth 3 (three) times daily. As needed) 30 tablet 5  . Vitamin D, Ergocalciferol, (DRISDOL) 50000 units CAPS capsule Take 1 capsule (50,000 Units total) by mouth every 7 (seven) days. 4 capsule 0   No current facility-administered medications on file prior to visit.     PAST MEDICAL HISTORY: Past Medical History:  Diagnosis Date  . Allergy   . Anxiety   . Asthma    pt has inhaler  . Back pain   . C. difficile colitis   . Cataract    bil eyes  . Chest pain   . Chronic fatigue syndrome   . Clostridium difficile colitis 12/13/2016  . Diverticulitis   . Dyspnea   . Dysrhythmia    palpitations  . Edema   . Elevated blood pressure reading without diagnosis of hypertension    "just elevated when I'm in pain" (12/07/2015)  . Fatty liver   . Fibromyalgia   . Ganglion of joint    right wrist  . GERD (gastroesophageal reflux disease)   . H. pylori infection   . Headache    hx of  . Helicobacter pylori (H. pylori) infection 12/13/2016  . Herpes zoster without mention of complication   . HTN (hypertension)   . Hyperlipidemia   . Hyperthyroidism   . IBS (irritable bowel syndrome)   . Leg edema   . Multiple drug allergies 06/18/2017  . Myalgia   . Nontraumatic rupture of Achilles tendon   . Other acute reactions to stress   . Pain in joint, shoulder region   . Pain in joint, upper arm   . Pain in limb   . Palpitations   . Personal history of other diseases of digestive system   . Pneumonia    once  . PONV (postoperative nausea and vomiting)   . Routine screening for STI (sexually transmitted infection) 12/13/2016  .  S/P partial colectomy 06/18/2017  . Thyroid disease    hyperthyroidism  . Vitamin B 12 deficiency     PAST SURGICAL HISTORY: Past Surgical History:  Procedure Laterality Date  . ABDOMINAL HYSTERECTOMY    . ACHILLES TENDON REPAIR Right 2005  . BUNIONECTOMY Bilateral    Bunionectomy 1983  . COLON SURGERY  01/23/2017   6 to 8 inches sigmoid colon removed  . GANGLION CYST EXCISION Right    wrist  . LEFT HEART CATH AND CORONARY ANGIOGRAPHY N/A 10/03/2016   Procedure: Left Heart Cath and Coronary Angiography;  Surgeon: Burnell Blanks, MD;  Location: [facility];  Service: Cardiovascular;  Laterality: N/A;  . MENISCUS REPAIR Right 04/2014  . TUBAL LIGATION    . wisdomteeth extraction      SOCIAL HISTORY: Social History   Tobacco Use  . Smoking status: Never Smoker  . Smokeless tobacco: Never Used  Substance Use Topics  . Alcohol use: No    Alcohol/week: 0.0 oz  . Drug use: No    FAMILY HISTORY: Family History  Problem Relation Age of Onset  . Hypertension Father   . Diabetes Father   . Heart disease Father   . Kidney disease Father        Died, 55  . Hyperlipidemia Father   . Leukemia Brother   . Cancer Brother        myleoblastic anemia  . Hyperlipidemia Mother   . Hypertension Mother        Died, 54  . Thyroid disease Mother        Thyroid surgery  . Obesity Mother   . Heart disease Mother   . Pernicious anemia Sister   . Thyroid disease Sister        On thyroid Rx  . Lupus Daughter   . Coronary artery disease Brother   . Hypertension Brother   . Colon cancer Neg Hx   . Esophageal cancer Neg Hx   . Rectal cancer Neg Hx   . Stomach cancer Neg Hx   . Pancreatic cancer Neg Hx   . Prostate cancer Neg Hx     ROS: Review of Systems  Constitutional: Positive for weight loss.  Cardiovascular: Negative for chest pain and claudication.  Gastrointestinal: Negative for diarrhea, nausea and vomiting.  Genitourinary: Negative for frequency.    Musculoskeletal: Negative for myalgias.       Negative muscle weakness  Endo/Heme/Allergies: Negative for polydipsia.       Negative hypoglycemia Negative polyphagia    PHYSICAL EXAM: Blood pressure 108/75, pulse 62, temperature 98.3 F (36.8 C), temperature source Oral, height _0  (1.651 m), weight 223 lb (101.2 kg), SpO2 98 %. Body mass index is 37.11 kg/m. Physical Exam  Constitutional: She is oriented to person, place, and time. She appears well-developed and well-nourished.  Cardiovascular: Normal rate.  Pulmonary/Chest: Effort normal.  Musculoskeletal: Normal range of motion.  Neurological: She is oriented to person, place, and time.  Skin: Skin is warm and dry.  Psychiatric: She has a normal mood and affect. Her behavior is normal.  Vitals reviewed.   RECENT LABS AND TESTS: BMET    Component Value Date/Time   NA 144 12/12/2017 1102   K 4.5 12/12/2017 1102   CL 104 12/12/2017 1102   CO2 27 12/12/2017 1102   GLUCOSE 88 12/12/2017 1102   GLUCOSE 82 10/11/2017 1027   BUN 13 12/12/2017 1102   CREATININE 0.82 12/12/2017 1102   CREATININE 0.97 03/23/2016 1501   CALCIUM 9.9 12/12/2017 1102   GFRNONAA 80 12/12/2017 1102   GFRAA 92 12/12/2017 1102   Lab Results  Component Value Date   HGBA1C 5.8 (H) 12/12/2017   HGBA1C 5.5 01/15/2017   HGBA1C 5.9 (H) 12/07/2015   HGBA1C 6.1 03/06/2011   HGBA1C 6.0 09/07/2010   Lab Results  Component Value Date   INSULIN 17.6 12/12/2017   CBC    Component Value Date/Time   WBC 4.5 12/12/2017 1102   WBC 11.0 (H) 08/22/2017 1637   RBC 4.72 12/12/2017 1102   RBC 4.38 08/22/2017 1637  HGB 13.5 12/12/2017 1102   HCT 41.8 12/12/2017 1102   PLT 255.0 08/22/2017 1637   MCV 89 12/12/2017 1102   MCH 28.6 12/12/2017 1102   MCH 28.9 02/08/2017 0909   MCHC 32.3 12/12/2017 1102   MCHC 32.2 08/22/2017 1637   RDW 15.1 12/12/2017 1102   LYMPHSABS 2.3 12/12/2017 1102   MONOABS 0.5 04/04/2017 1045   EOSABS 0.1 12/12/2017 1102    BASOSABS 0.0 12/12/2017 1102   Iron/TIBC/Ferritin/ %Sat No results found for: IRON, TIBC, FERRITIN, IRONPCTSAT Lipid Panel     Component Value Date/Time   CHOL 196 12/12/2017 1102   TRIG 94 12/12/2017 1102   HDL 56 12/12/2017 1102   CHOLHDL 4 10/11/2017 1027   VLDL 20.6 10/11/2017 1027   LDLCALC 121 (H) 12/12/2017 1102   LDLDIRECT 139.4 12/26/2011 0905   Hepatic Function Panel     Component Value Date/Time   PROT 7.2 12/12/2017 1102   ALBUMIN 4.5 12/12/2017 1102   AST 25 12/12/2017 1102   ALT 30 12/12/2017 1102   ALKPHOS 76 12/12/2017 1102   BILITOT 0.5 12/12/2017 1102   BILIDIR 0.1 09/07/2014 1031      Component Value Date/Time   TSH 0.83 11/13/2017 0847   TSH 0.90 05/07/2017 1145   TSH 1.11 10/05/2016 1523  Results for Wollschlager, Vayla W "[name]" (MRN [id number]) as of 03/20/2018 17:42  Ref. Range 12/12/2017 11:02  Vitamin D, 25-Hydroxy Latest Ref Range: 30.0 - 100.0 ng/mL 29.4 (L)    ASSESSMENT AND PLAN: Prediabetes - Plan: Comprehensive metabolic panel, Hemoglobin A1c, Insulin, random, metFORMIN (GLUCOPHAGE) 500 MG tablet  Vitamin D deficiency - Plan: VITAMIN D 25 Hydroxy (Vit-D Deficiency, Fractures)  Other hyperlipidemia - Plan: Lipid Panel With LDL/HDL Ratio  At risk for diabetes mellitus  Class 2 severe obesity with serious comorbidity and body mass index (BMI) of 37.0 to 37.9 in adult, unspecified obesity type [facility])  PLAN:  Pre-Diabetes Tekeya will continue to work on weight loss, exercise, and decreasing simple carbohydrates in her diet to help decrease the risk of diabetes. We dicussed metformin including benefits and risks. She was informed that eating too many simple carbohydrates or too many calories at one sitting increases the likelihood of GI side effects. Virgin agrees to continue taking metformin 500 mg q AM #30 and we will refill for 1 month. Emily Osborne agrees to follow up with our clinic in 2 to 3 weeks as directed to monitor her progress.  Diabetes risk  counselling Emily Osborne was given extended (15 minutes) diabetes prevention counseling today. She is 57 y.o. female and has risk factors for diabetes including obesity and pre-diabetes. We discussed intensive lifestyle modifications today with an emphasis on weight loss as well as increasing exercise and decreasing simple carbohydrates in her diet.  Vitamin D Deficiency Emily Osborne was informed that low vitamin D levels contributes to fatigue and are associated with obesity, breast, and colon cancer. Emily Osborne agrees to continue taking prescription Vit D _0 ,000 IU every week and will follow up for routine testing of vitamin D, at least 2-3 times per year. She was informed of the risk of over-replacement of vitamin D and agrees to not increase her dose unless she discusses this with [place] first. We will check labs today and Emily Osborne agrees to follow up with our clinic in 2 to 3 weeks.  Hyperlipidemia Emily Osborne was informed of the American Heart Association Guidelines emphasizing intensive lifestyle modifications as the first line treatment for hyperlipidemia. We discussed many lifestyle modifications today in depth, and  Emily Osborne will continue to work on decreasing saturated fats such as fatty red meat, butter and many fried foods. She will also increase vegetables and lean protein in her diet and continue to work on diet, exercise, and weight loss efforts. We will check labs today and Emily Osborne agrees to follow up with our clinic in 2 to 3 weeks.  Obesity Emily Osborne is currently in the action stage of change. As such, her goal is to continue with weight loss efforts She has agreed to follow the Category 2 plan Emily Osborne has been instructed to work up to a goal of 150 minutes of combined cardio and strengthening exercise per week for weight loss and overall health benefits. We discussed the following Behavioral Modification Strategies today: work on meal planning and easy cooking plans and no skipping meals   Emily Osborne has agreed to  follow up with our clinic in 2 to 3 weeks. She was informed of the importance of frequent follow up visits to maximize her success with intensive lifestyle modifications for her multiple health conditions.   OBESITY BEHAVIORAL INTERVENTION VISIT  Today's visit was # 6 out of 22.  Starting weight: 226 lbs Starting date: 12/12/17 Today's weight : 223 lbs  Today's date: 03/20/2018 Total lbs lost to date: 3    ASK: We discussed the diagnosis of obesity with Emily Osborne today and Emily Osborne agreed to give [place] permission to discuss obesity behavioral modification therapy today.  ASSESS: Emily Osborne has the diagnosis of obesity and her BMI today is 37.11 Avonne is in the action stage of change   ADVISE: Lei was educated on the multiple health risks of obesity as well as the benefit of weight loss to improve her health. She was advised of the need for long term treatment and the importance of lifestyle modifications.  AGREE: Multiple dietary modification options and treatment options were discussed and  [place] agreed to the above obesity treatment plan.  Wilhemena Durie, am acting as transcriptionist for Abby Potash, PA-C I, Abby Potash, PA-C have reviewed above note and agree with its content

## 2018-03-21 ENCOUNTER — Ambulatory Visit: Payer: BC Managed Care – PPO | Admitting: Family Medicine

## 2018-03-21 ENCOUNTER — Encounter: Payer: Self-pay | Admitting: Family Medicine

## 2018-03-21 DIAGNOSIS — Z0289 Encounter for other administrative examinations: Secondary | ICD-10-CM

## 2018-03-21 LAB — COMPREHENSIVE METABOLIC PANEL
A/G RATIO: 1.8 (ref 1.2–2.2)
ALT: 32 IU/L (ref 0–32)
AST: 39 IU/L (ref 0–40)
Albumin: 4.4 g/dL (ref 3.5–5.5)
Alkaline Phosphatase: 64 IU/L (ref 39–117)
BUN/Creatinine Ratio: 18 (ref 9–23)
BUN: 17 mg/dL (ref 6–24)
Bilirubin Total: 0.4 mg/dL (ref 0.0–1.2)
CO2: 23 mmol/L (ref 20–29)
CREATININE: 0.96 mg/dL (ref 0.57–1.00)
Calcium: 9.3 mg/dL (ref 8.7–10.2)
Chloride: 105 mmol/L (ref 96–106)
GFR calc Af Amer: 75 mL/min/{1.73_m2} (ref >59)
GFR calc non Af Amer: 65 mL/min/{1.73_m2} (ref >59)
Globulin, Total: 2.4 g/dL (ref 1.5–4.5)
Glucose: 92 mg/dL (ref 65–99)
POTASSIUM: 4.3 mmol/L (ref 3.5–5.2)
Sodium: 142 mmol/L (ref 134–144)
Total Protein: 6.8 g/dL (ref 6.0–8.5)

## 2018-03-21 LAB — VITAMIN D 25 HYDROXY (VIT D DEFICIENCY, FRACTURES): VIT D 25 HYDROXY: 31 ng/mL (ref 30.0–100.0)

## 2018-03-21 LAB — LIPID PANEL WITH LDL/HDL RATIO
Cholesterol, Total: 197 mg/dL (ref 100–199)
HDL: 57 mg/dL (ref >39)
LDL Calculated: 124 mg/dL — ABNORMAL HIGH (ref 0–99)
LDl/HDL Ratio: 2.2 ratio (ref 0.0–3.2)
Triglycerides: 78 mg/dL (ref 0–149)
VLDL Cholesterol Cal: 16 mg/dL (ref 5–40)

## 2018-03-21 LAB — HEMOGLOBIN A1C
ESTIMATED AVERAGE GLUCOSE: 114 mg/dL
Hgb A1c MFr Bld: 5.6 % (ref 4.8–5.6)

## 2018-03-21 LAB — INSULIN, RANDOM: INSULIN: 20 u[IU]/mL (ref 2.6–24.9)

## 2018-03-24 ENCOUNTER — Other Ambulatory Visit: Payer: Self-pay | Admitting: Family Medicine

## 2018-03-24 DIAGNOSIS — Z1231 Encounter for screening mammogram for malignant neoplasm of breast: Secondary | ICD-10-CM

## 2018-03-24 NOTE — Telephone Encounter (Signed)
-----   Message from [name], DO sent at 03/21/2018  3:53 PM EDT ----- Not like her to no show --- her daughter was in beh health and was d/c today

## 2018-03-24 NOTE — Telephone Encounter (Signed)
Author phoned pt. To reschedule missed appointment.

## 2018-04-02 ENCOUNTER — Other Ambulatory Visit (INDEPENDENT_AMBULATORY_CARE_PROVIDER_SITE_OTHER): Payer: Self-pay

## 2018-04-02 ENCOUNTER — Telehealth (INDEPENDENT_AMBULATORY_CARE_PROVIDER_SITE_OTHER): Payer: Self-pay

## 2018-04-02 ENCOUNTER — Encounter (INDEPENDENT_AMBULATORY_CARE_PROVIDER_SITE_OTHER): Payer: Self-pay | Admitting: Family Medicine

## 2018-04-02 ENCOUNTER — Encounter (INDEPENDENT_AMBULATORY_CARE_PROVIDER_SITE_OTHER): Payer: Self-pay

## 2018-04-02 ENCOUNTER — Ambulatory Visit (INDEPENDENT_AMBULATORY_CARE_PROVIDER_SITE_OTHER): Payer: Self-pay | Admitting: Physician Assistant

## 2018-04-02 DIAGNOSIS — E559 Vitamin D deficiency, unspecified: Secondary | ICD-10-CM

## 2018-04-02 MED ORDER — VITAMIN D (ERGOCALCIFEROL) 1.25 MG (50000 UNIT) PO CAPS: 50000.0000 [IU] | ORAL_CAPSULE | ORAL | 0 refills | Status: DC

## 2018-04-02 NOTE — Telephone Encounter (Signed)
Sent the pt a mychart message. April, CMA

## 2018-04-02 NOTE — Telephone Encounter (Signed)
Pt would like refill of Vit D.

## 2018-04-03 ENCOUNTER — Ambulatory Visit (INDEPENDENT_AMBULATORY_CARE_PROVIDER_SITE_OTHER): Payer: BC Managed Care – PPO | Admitting: Physician Assistant

## 2018-04-08 ENCOUNTER — Ambulatory Visit: Payer: BC Managed Care – PPO | Admitting: Family Medicine

## 2018-04-09 ENCOUNTER — Encounter (INDEPENDENT_AMBULATORY_CARE_PROVIDER_SITE_OTHER): Payer: Self-pay

## 2018-04-09 ENCOUNTER — Ambulatory Visit (INDEPENDENT_AMBULATORY_CARE_PROVIDER_SITE_OTHER): Payer: BC Managed Care – PPO | Admitting: Physician Assistant

## 2018-04-10 ENCOUNTER — Ambulatory Visit (INDEPENDENT_AMBULATORY_CARE_PROVIDER_SITE_OTHER): Payer: BC Managed Care – PPO | Admitting: Physician Assistant

## 2018-04-10 VITALS — BP 120/74 | HR 79 | Temp 97.8°F | Ht 65.0 in | Wt 226.0 lb

## 2018-04-10 DIAGNOSIS — E559 Vitamin D deficiency, unspecified: Secondary | ICD-10-CM | POA: Diagnosis not present

## 2018-04-10 DIAGNOSIS — R7303 Prediabetes: Secondary | ICD-10-CM

## 2018-04-10 DIAGNOSIS — Z6837 Body mass index (BMI) 37.0-37.9, adult: Secondary | ICD-10-CM

## 2018-04-10 DIAGNOSIS — Z9189 Other specified personal risk factors, not elsewhere classified: Secondary | ICD-10-CM | POA: Diagnosis not present

## 2018-04-10 MED ORDER — METFORMIN HCL 500 MG PO TABS: 500.0000 mg | ORAL_TABLET | Freq: Every day | ORAL | 0 refills | Status: DC

## 2018-04-10 MED ORDER — VITAMIN D (ERGOCALCIFEROL) 1.25 MG (50000 UNIT) PO CAPS: 50000.0000 [IU] | ORAL_CAPSULE | ORAL | 0 refills | Status: DC

## 2018-04-10 NOTE — Progress Notes (Signed)
Office: [phone]  /  Fax: [phone]   HPI:   Chief Complaint: OBESITY Emily Osborne is here to discuss her progress with her obesity treatment plan. She is on the Category 2 plan and is following her eating plan approximately 50 % of the time. She states she is exercising 45 minutes 2 times for 1 week. Emily Osborne struggled to follow the plan, due to stress at work and at home. She reports emotional eating. She also states that she has not been hungry and she is not getting the food in on the meal plan. Her weight is 226 lb (102.5 kg) today and has not lost weight since her last visit. She has lost 0 lbs since starting treatment with [place].  Pre-Diabetes Emily Osborne has a diagnosis of prediabetes based on her elevated Hgb A1c and was informed this puts her at greater risk of developing diabetes. She is taking metformin currently and continues to work on diet and exercise to decrease risk of diabetes. She denies nausea, vomiting or diarrhea. Emily Osborne denies polyphagia or hypoglycemia.  At risk for diabetes Emily Osborne is at higher than average risk for developing diabetes due to her obesity and prediabetes. She currently denies polyuria or polydipsia.  Vitamin D deficiency Emily Osborne has a diagnosis of vitamin D deficiency. Emily Osborne is currently taking prescription vit D and denies nausea, vomiting or muscle weakness.  ALLERGIES: Allergies  Allergen Reactions  . Aspirin Other (See Comments)    REACTION: anaphylaxis  . Gentamicin Other (See Comments)    Eye drops turned the sclera bright red  . Ibuprofen Other (See Comments)    REACTION: anaphylaxsis  . Metronidazole Swelling    REACTION: red face/swelling  . Nsaids Other (See Comments)    REACTION: anaphylaxis  . Doxycycline Other (See Comments)    REACTION: severe nausea/vomiting  . Influenza A (H1n1) [place] Vac Other (See Comments)    REACTION: sick for 3 weeks  . Loratadine Other (See Comments)    Fatigue/weakness  . Periactin [Cyproheptadine]  Other (See Comments)    paranoid   . Ranitidine Hcl Other (See Comments)    REACTION: Lips turned red /peel  . Sulfamethoxazole-Trimethoprim Other (See Comments)    REACTION: face red/peel  . Tramadol Hcl Other (See Comments)    REACTION: paranoid    MEDICATIONS: Current Outpatient Medications on File Prior to Visit  Medication Sig Dispense Refill  . acetaminophen (TYLENOL) 500 MG tablet Take 1,000 mg by mouth every 6 (six) hours as needed for moderate pain.     Marland Kitchen ADVAIR DISKUS 250-50 MCG/DOSE AEPB TAKE 1 PUFF BY MOUTH TWICE A DAY 60 each 2  . albuterol (PROAIR HFA) 108 (90 Base) MCG/ACT inhaler USE 2 PUFFS EVERY 4 HOURS AS NEEDED FOR COUGH, WHEEZE OR SHORTNESS OF BREATH. 8.5 each 2  . ALPRAZolam (XANAX) 0.25 MG tablet Take 1 tablet (0.25 mg total) by mouth 3 (three) times daily as needed. 30 tablet 2  . atenolol (TENORMIN) 25 MG tablet Take 0.5 tablets (12.5 mg total) by mouth daily. Takes 0.5 tablet 45 tablet 3  . azelastine (OPTIVAR) 0.05 % ophthalmic solution Place 1 drop into both eyes 2 (two) times daily. 6 mL 12  . Blood Glucose Monitoring Suppl (FREESTYLE FREEDOM) KIT 1 Device by Does not apply route daily. 1 each 0  . DEXILANT 60 MG capsule TAKE ONE CAPSULE BY MOUTH DAILY 90 capsule 1  . fexofenadine (ALLEGRA) 180 MG tablet Take 1 tablet (180 mg total) by mouth daily. 90 tablet 3  .  Flaxseed, Linseed, (FLAXSEED OIL PO) Take by mouth. One capsule daily    . fluorometholone (FML) 0.1 % ophthalmic suspension Place 1 drop into the right eye every 4 (four) hours.    . fluticasone (FLONASE) 50 MCG/ACT nasal spray PLACE 2 SPRAYS INTO BOTH NOSTRILS DAILY. 16 g 4  . FREESTYLE LITE test strip USE TO CHECK BLOOD SUGAR ONCE A DAY 100 each 0  . Iron Combinations (I.L.X. B-12) ELIX Take 1 each by mouth daily as needed (energy). 1 droplet full    . Multiple Vitamins-Minerals (MULTIVITAMIN WITH MINERALS) tablet Take 1 tablet by mouth daily.    . valACYclovir (VALTREX) 1000 MG tablet Take 1  tablet (1,000 mg total) by mouth 3 (three) times daily. (Patient taking differently: Take 1,000 mg by mouth 3 (three) times daily. As needed) 30 tablet 5   No current facility-administered medications on file prior to visit.     PAST MEDICAL HISTORY: Past Medical History:  Diagnosis Date  . Allergy   . Anxiety   . Asthma    pt has inhaler  . Back pain   . C. difficile colitis   . Cataract    bil eyes  . Chest pain   . Chronic fatigue syndrome   . Clostridium difficile colitis 12/13/2016  . Diverticulitis   . Dyspnea   . Dysrhythmia    palpitations  . Edema   . Elevated blood pressure reading without diagnosis of hypertension    "just elevated when I'm in pain" (12/07/2015)  . Fatty liver   . Fibromyalgia   . Ganglion of joint    right wrist  . GERD (gastroesophageal reflux disease)   . H. pylori infection   . Headache    hx of  . Helicobacter pylori (H. pylori) infection 12/13/2016  . Herpes zoster without mention of complication   . HTN (hypertension)   . Hyperlipidemia   . Hyperthyroidism   . IBS (irritable bowel syndrome)   . Leg edema   . Multiple drug allergies 06/18/2017  . Myalgia   . Nontraumatic rupture of Achilles tendon   . Other acute reactions to stress   . Pain in joint, shoulder region   . Pain in joint, upper arm   . Pain in limb   . Palpitations   . Personal history of other diseases of digestive system   . Pneumonia    once  . PONV (postoperative nausea and vomiting)   . Routine screening for STI (sexually transmitted infection) 12/13/2016  . S/P partial colectomy 06/18/2017  . Thyroid disease    hyperthyroidism  . Vitamin B 12 deficiency     PAST SURGICAL HISTORY: Past Surgical History:  Procedure Laterality Date  . ABDOMINAL HYSTERECTOMY    . ACHILLES TENDON REPAIR Right 2005  . BUNIONECTOMY Bilateral    Bunionectomy 1983  . COLON SURGERY  01/23/2017   6 to 8 inches sigmoid colon removed  . GANGLION CYST EXCISION Right    wrist  .  LEFT HEART CATH AND CORONARY ANGIOGRAPHY N/A 10/03/2016   Procedure: Left Heart Cath and Coronary Angiography;  Surgeon: Burnell Blanks, MD;  Location: [facility];  Service: Cardiovascular;  Laterality: N/A;  . MENISCUS REPAIR Right 04/2014  . TUBAL LIGATION    . wisdomteeth extraction      SOCIAL HISTORY: Social History   Tobacco Use  . Smoking status: Never Smoker  . Smokeless tobacco: Never Used  Substance Use Topics  . Alcohol use: No  Alcohol/week: 0.0 standard drinks  . Drug use: No    FAMILY HISTORY: Family History  Problem Relation Age of Onset  . Hypertension Father   . Diabetes Father   . Heart disease Father   . Kidney disease Father        Died, 15  . Hyperlipidemia Father   . Leukemia Brother   . Cancer Brother        myleoblastic anemia  . Hyperlipidemia Mother   . Hypertension Mother        Died, 37  . Thyroid disease Mother        Thyroid surgery  . Obesity Mother   . Heart disease Mother   . Pernicious anemia Sister   . Thyroid disease Sister        On thyroid Rx  . Lupus Daughter   . Coronary artery disease Brother   . Hypertension Brother   . Colon cancer Neg Hx   . Esophageal cancer Neg Hx   . Rectal cancer Neg Hx   . Stomach cancer Neg Hx   . Pancreatic cancer Neg Hx   . Prostate cancer Neg Hx     ROS: Review of Systems  Constitutional: Negative for weight loss.  Gastrointestinal: Negative for diarrhea, nausea and vomiting.  Genitourinary: Negative for frequency.  Musculoskeletal:       Negative for muscle weakness  Endo/Heme/Allergies: Negative for polydipsia.       Negative for polyphagia Negative for hypoglycemia    PHYSICAL EXAM: Blood pressure 120/74, pulse 79, temperature 97.8 F (36.6 C), temperature source Oral, height 5' 5"  (1.651 m), weight 226 lb (102.5 kg), SpO2 98 %. Body mass index is 37.61 kg/m. Physical Exam  Constitutional: She is oriented to person, place, and time. She appears well-developed  and well-nourished.  Cardiovascular: Normal rate.  Pulmonary/Chest: Effort normal.  Musculoskeletal: Normal range of motion.  Neurological: She is oriented to person, place, and time.  Skin: Skin is warm and dry.  Psychiatric: She has a normal mood and affect. Her behavior is normal.  Vitals reviewed.   RECENT LABS AND TESTS: BMET    Component Value Date/Time   NA 142 03/20/2018 1046   K 4.3 03/20/2018 1046   CL 105 03/20/2018 1046   CO2 23 03/20/2018 1046   GLUCOSE 92 03/20/2018 1046   GLUCOSE 82 10/11/2017 1027   BUN 17 03/20/2018 1046   CREATININE 0.96 03/20/2018 1046   CREATININE 0.97 03/23/2016 1501   CALCIUM 9.3 03/20/2018 1046   GFRNONAA 65 03/20/2018 1046   GFRAA 75 03/20/2018 1046   Lab Results  Component Value Date   HGBA1C 5.6 03/20/2018   HGBA1C 5.8 (H) 12/12/2017   HGBA1C 5.5 01/15/2017   HGBA1C 5.9 (H) 12/07/2015   HGBA1C 6.1 03/06/2011   Lab Results  Component Value Date   INSULIN 20.0 03/20/2018   INSULIN 17.6 12/12/2017   CBC    Component Value Date/Time   WBC 4.5 12/12/2017 1102   WBC 11.0 (H) 08/22/2017 1637   RBC 4.72 12/12/2017 1102   RBC 4.38 08/22/2017 1637   HGB 13.5 12/12/2017 1102   HCT 41.8 12/12/2017 1102   PLT 255.0 08/22/2017 1637   MCV 89 12/12/2017 1102   MCH 28.6 12/12/2017 1102   MCH 28.9 02/08/2017 0909   MCHC 32.3 12/12/2017 1102   MCHC 32.2 08/22/2017 1637   RDW 15.1 12/12/2017 1102   LYMPHSABS 2.3 12/12/2017 1102   MONOABS 0.5 04/04/2017 1045   EOSABS 0.1 12/12/2017 1102  BASOSABS 0.0 12/12/2017 1102   Iron/TIBC/Ferritin/ %Sat No results found for: IRON, TIBC, FERRITIN, IRONPCTSAT Lipid Panel     Component Value Date/Time   CHOL 197 03/20/2018 1046   TRIG 78 03/20/2018 1046   HDL 57 03/20/2018 1046   CHOLHDL 4 10/11/2017 1027   VLDL 20.6 10/11/2017 1027   LDLCALC 124 (H) 03/20/2018 1046   LDLDIRECT 139.4 12/26/2011 0905   Hepatic Function Panel     Component Value Date/Time   PROT 6.8 03/20/2018 1046     ALBUMIN 4.4 03/20/2018 1046   AST 39 03/20/2018 1046   ALT 32 03/20/2018 1046   ALKPHOS 64 03/20/2018 1046   BILITOT 0.4 03/20/2018 1046   BILIDIR 0.1 09/07/2014 1031      Component Value Date/Time   TSH 0.83 11/13/2017 0847   TSH 0.90 05/07/2017 1145   TSH 1.11 10/05/2016 1523   Results for Hartig, Mykia W "[name]" (MRN [id number]) as of 04/10/2018 11:11  Ref. Range 03/20/2018 10:46  Vitamin D, 25-Hydroxy Latest Ref Range: 30.0 - 100.0 ng/mL 31.0   ASSESSMENT AND PLAN: Prediabetes - Plan: metFORMIN (GLUCOPHAGE) 500 MG tablet  Vitamin D deficiency - Plan: Vitamin D, Ergocalciferol, (DRISDOL) 50000 units CAPS capsule  At risk for diabetes mellitus  Class 2 severe obesity with serious comorbidity and body mass index (BMI) of 37.0 to 37.9 in adult, unspecified obesity type [facility])  PLAN:  Pre-Diabetes Emily Osborne will continue to work on weight loss, exercise, and decreasing simple carbohydrates in her diet to help decrease the risk of diabetes. We dicussed metformin including benefits and risks. She was informed that eating too many simple carbohydrates or too many calories at one sitting increases the likelihood of GI side effects. Emily Osborne requested metformin for now and a prescription was written today for 1 month refill. Emily Osborne agreed to follow up with [place] as directed to monitor her progress.  Diabetes risk counseling Emily Osborne was given extended (15 minutes) diabetes prevention counseling today. She is 57 y.o. female and has risk factors for diabetes including obesity and prediabetes. We discussed intensive lifestyle modifications today with an emphasis on weight loss as well as increasing exercise and decreasing simple carbohydrates in her diet.  Vitamin D Deficiency Emily Osborne was informed that low vitamin D levels contributes to fatigue and are associated with obesity, breast, and colon cancer. She agrees to continue to take prescription Vit D @50 ,000 IU every week #4 with no refills and will  follow up for routine testing of vitamin D, at least 2-3 times per year. She was informed of the risk of over-replacement of vitamin D and agrees to not increase her dose unless she discusses this with [place] first. Emily Osborne agrees to follow up as directed.  Obesity Emily Osborne is currently in the action stage of change. As such, her goal is to continue with weight loss efforts She has agreed to follow the Category 2 plan Emily Osborne has been instructed to work up to a goal of 150 minutes of combined cardio and strengthening exercise per week for weight loss and overall health benefits. We discussed the following Behavioral Modification Strategies today: no skipping meals and work on meal planning and easy cooking plans  Emily Osborne has agreed to follow up with our clinic in 2 weeks. She was informed of the importance of frequent follow up visits to maximize her success with intensive lifestyle modifications for her multiple health conditions.   OBESITY BEHAVIORAL INTERVENTION VISIT  Today's visit was # 7   Starting weight: 226 lbs Starting  date: 12/12/17 Today's weight : 226 lbs  Today's date: 04/10/2018 Total lbs lost to date: 0 At least 15 minutes were spent on discussing the following behavioral intervention visit.   ASK: We discussed the diagnosis of obesity with Emily Osborne today and Emily Osborne agreed to give [place] permission to discuss obesity behavioral modification therapy today.  ASSESS: Vaughn has the diagnosis of obesity and her BMI today is 37.61 Emily Osborne is in the action stage of change   ADVISE: Emily Osborne was educated on the multiple health risks of obesity as well as the benefit of weight loss to improve her health. She was advised of the need for long term treatment and the importance of lifestyle modifications to improve her current health and to decrease her risk of future health problems.  AGREE: Multiple dietary modification options and treatment options were discussed and  Emily Osborne agreed to  follow the recommendations documented in the above note.  ARRANGE: Emily Osborne was educated on the importance of frequent visits to treat obesity as outlined per CMS and USPSTF guidelines and agreed to schedule her next follow up appointment today.  Corey Skains, am acting as transcriptionist for Abby Potash, PA-C I, Abby Potash, PA-C have reviewed above note and agree with its content

## 2018-04-17 ENCOUNTER — Ambulatory Visit: Payer: BC Managed Care – PPO | Admitting: Family Medicine

## 2018-04-17 ENCOUNTER — Telehealth: Payer: Self-pay | Admitting: Family Medicine

## 2018-04-17 NOTE — Telephone Encounter (Signed)
Patient dropping off flu exemption form to be signed. Please scan & fax to [facility] health at work [phone].

## 2018-04-18 ENCOUNTER — Ambulatory Visit (INDEPENDENT_AMBULATORY_CARE_PROVIDER_SITE_OTHER): Payer: BC Managed Care – PPO | Admitting: Family Medicine

## 2018-04-18 ENCOUNTER — Encounter: Payer: Self-pay | Admitting: Family Medicine

## 2018-04-18 VITALS — BP 122/80 | HR 75 | Temp 97.7°F | Resp 16 | Ht 65.0 in | Wt 227.0 lb

## 2018-04-18 DIAGNOSIS — I10 Essential (primary) hypertension: Secondary | ICD-10-CM

## 2018-04-18 NOTE — Patient Instructions (Signed)

## 2018-04-18 NOTE — Assessment & Plan Note (Signed)
Well controlled, no changes to meds. Encouraged heart healthy diet such as the DASH diet and exercise as tolerated.  Labs from healthy weight and wellness reviewed rto cpe

## 2018-04-18 NOTE — Telephone Encounter (Signed)
Form faxed

## 2018-04-18 NOTE — Progress Notes (Signed)
Patient ID: Emily Osborne, female    DOB: 26-Jun-1961  Age: 57 y.o. MRN: [id number]    Subjective:  Subjective  HPI Emily Osborne presents for f/u bp -- she will schedule a cpe   Review of Systems  Constitutional: Negative for appetite change, diaphoresis, fatigue and unexpected weight change.  Eyes: Negative for pain, redness and visual disturbance.  Respiratory: Negative for cough, chest tightness, shortness of breath and wheezing.   Cardiovascular: Negative for chest pain, palpitations and leg swelling.  Endocrine: Negative for cold intolerance, heat intolerance, polydipsia, polyphagia and polyuria.  Genitourinary: Negative for difficulty urinating, dysuria and frequency.  Neurological: Negative for dizziness, light-headedness, numbness and headaches.    History Past Medical History:  Diagnosis Date  . Allergy   . Anxiety   . Asthma    pt has inhaler  . Back pain   . C. difficile colitis   . Cataract    bil eyes  . Chest pain   . Chronic fatigue syndrome   . Clostridium difficile colitis 12/13/2016  . Diverticulitis   . Dyspnea   . Dysrhythmia    palpitations  . Edema   . Elevated blood pressure reading without diagnosis of hypertension    "just elevated when I'm in pain" (12/07/2015)  . Fatty liver   . Fibromyalgia   . Ganglion of joint    right wrist  . GERD (gastroesophageal reflux disease)   . H. pylori infection   . Headache    hx of  . Helicobacter pylori (H. pylori) infection 12/13/2016  . Herpes zoster without mention of complication   . HTN (hypertension)   . Hyperlipidemia   . Hyperthyroidism   . IBS (irritable bowel syndrome)   . Leg edema   . Multiple drug allergies 06/18/2017  . Myalgia   . Nontraumatic rupture of Achilles tendon   . Other acute reactions to stress   . Pain in joint, shoulder region   . Pain in joint, upper arm   . Pain in limb   . Palpitations   . Personal history of other diseases of digestive system   . Pneumonia    once  . PONV (postoperative nausea and vomiting)   . Routine screening for STI (sexually transmitted infection) 12/13/2016  . S/P partial colectomy 06/18/2017  . Thyroid disease    hyperthyroidism  . Vitamin B 12 deficiency     She has a past surgical history that includes Abdominal hysterectomy; Tubal ligation; Bunionectomy (Bilateral); Achilles tendon repair (Right, 2005); Ganglion cyst excision (Right); Meniscus repair (Right, 04/2014); wisdomteeth extraction; LEFT HEART CATH AND CORONARY ANGIOGRAPHY (N/A, 10/03/2016); and Colon surgery (01/23/2017).   Her family history includes Cancer in her brother; Coronary artery disease in her brother; Diabetes in her father; Heart disease in her father and mother; Hyperlipidemia in her father and mother; Hypertension in her brother, father, and mother; Kidney disease in her father; Leukemia in her brother; Lupus in her daughter; Obesity in her mother; Pernicious anemia in her sister; Thyroid disease in her mother and sister.She reports that she has never smoked. She has never used smokeless tobacco. She reports that she does not drink alcohol or use drugs.  Current Outpatient Medications on File Prior to Visit  Medication Sig Dispense Refill  . acetaminophen (TYLENOL) 500 MG tablet Take 1,000 mg by mouth every 6 (six) hours as needed for moderate pain.     Marland Kitchen ADVAIR DISKUS 250-50 MCG/DOSE AEPB TAKE 1 PUFF BY MOUTH TWICE A DAY 60  each 2  . albuterol (PROAIR HFA) 108 (90 Base) MCG/ACT inhaler USE 2 PUFFS EVERY 4 HOURS AS NEEDED FOR COUGH, WHEEZE OR SHORTNESS OF BREATH. 8.5 each 2  . ALPRAZolam (XANAX) 0.25 MG tablet Take 1 tablet (0.25 mg total) by mouth 3 (three) times daily as needed. 30 tablet 2  . atenolol (TENORMIN) 25 MG tablet Take 0.5 tablets (12.5 mg total) by mouth daily. Takes 0.5 tablet 45 tablet 3  . azelastine (OPTIVAR) 0.05 % ophthalmic solution Place 1 drop into both eyes 2 (two) times daily. 6 mL 12  . Blood Glucose Monitoring Suppl  (FREESTYLE FREEDOM) KIT 1 Device by Does not apply route daily. 1 each 0  . DEXILANT 60 MG capsule TAKE ONE CAPSULE BY MOUTH DAILY 90 capsule 1  . fexofenadine (ALLEGRA) 180 MG tablet Take 1 tablet (180 mg total) by mouth daily. 90 tablet 3  . Flaxseed, Linseed, (FLAXSEED OIL PO) Take by mouth. One capsule daily    . fluorometholone (FML) 0.1 % ophthalmic suspension Place 1 drop into the right eye as needed.     . fluticasone (FLONASE) 50 MCG/ACT nasal spray PLACE 2 SPRAYS INTO BOTH NOSTRILS DAILY. 16 g 4  . FREESTYLE LITE test strip USE TO CHECK BLOOD SUGAR ONCE A DAY 100 each 0  . Iron Combinations (I.L.X. B-12) ELIX Take 1 each by mouth daily as needed (energy). 1 droplet full    . metFORMIN (GLUCOPHAGE) 500 MG tablet Take 1 tablet (500 mg total) by mouth daily with breakfast. 30 tablet 0  . Multiple Vitamins-Minerals (MULTIVITAMIN WITH MINERALS) tablet Take 1 tablet by mouth daily.    . valACYclovir (VALTREX) 1000 MG tablet Take 1 tablet (1,000 mg total) by mouth 3 (three) times daily. (Patient taking differently: Take 1,000 mg by mouth as needed (3 times daily as needed). As needed) 30 tablet 5  . Vitamin D, Ergocalciferol, (DRISDOL) 50000 units CAPS capsule Take 1 capsule (50,000 Units total) by mouth every 7 (seven) days. 4 capsule 0   No current facility-administered medications on file prior to visit.      Objective:  Objective  Physical Exam  Constitutional: She is oriented to person, place, and time. She appears well-developed and well-nourished.  HENT:  Head: Normocephalic and atraumatic.  Eyes: Conjunctivae and EOM are normal.  Neck: Normal range of motion. Neck supple. No JVD present. Carotid bruit is not present. No thyromegaly present.  Cardiovascular: Normal rate, regular rhythm and normal heart sounds.  No murmur heard. Pulmonary/Chest: Effort normal and breath sounds normal. No respiratory distress. She has no wheezes. She has no rales. She exhibits no tenderness.    Musculoskeletal: She exhibits no edema.  Neurological: She is alert and oriented to person, place, and time.  Psychiatric: She has a normal mood and affect.  Nursing note and vitals reviewed.  BP 122/80 (BP Location: Left Arm, Patient Position: Sitting, Cuff Size: Large)   Pulse 75   Temp 97.7 F (36.5 C) (Oral)   Resp 16   Ht 5' 5"  (1.651 m)   Wt 227 lb (103 kg)   SpO2 99%   BMI 37.77 kg/m  Wt Readings from Last 3 Encounters:  04/18/18 227 lb (103 kg)  04/10/18 226 lb (102.5 kg)  03/20/18 223 lb (101.2 kg)     Lab Results  Component Value Date   WBC 4.5 12/12/2017   HGB 13.5 12/12/2017   HCT 41.8 12/12/2017   PLT 255.0 08/22/2017   GLUCOSE 92 03/20/2018   CHOL  197 03/20/2018   TRIG 78 03/20/2018   HDL 57 03/20/2018   LDLDIRECT 139.4 12/26/2011   LDLCALC 124 (H) 03/20/2018   ALT 32 03/20/2018   AST 39 03/20/2018   NA 142 03/20/2018   K 4.3 03/20/2018   CL 105 03/20/2018   CREATININE 0.96 03/20/2018   BUN 17 03/20/2018   CO2 23 03/20/2018   TSH 0.83 11/13/2017   INR 0.93 10/03/2016   HGBA1C 5.6 03/20/2018    Dg Chest 2 View  Result Date: 08/22/2017 CLINICAL DATA:  Cough, fever. EXAM: CHEST  2 VIEW COMPARISON:  Radiograph of October 14, 2016. FINDINGS: The heart size and mediastinal contours are within normal limits. Both lungs are clear. No pneumothorax or pleural effusion is noted. The visualized skeletal structures are unremarkable. IMPRESSION: No active cardiopulmonary disease. Electronically Signed   By: Marijo Conception, M.D.   On: 08/22/2017 16:09     Assessment & Plan:  Plan  I am having Emily Christians "[name]" maintain her acetaminophen, FREESTYLE FREEDOM, I.L.X. B-12, FREESTYLE LITE, albuterol, valACYclovir, (Flaxseed, Linseed, (FLAXSEED OIL PO)), atenolol, fluticasone, ALPRAZolam, fexofenadine, ADVAIR DISKUS, multivitamin with minerals, azelastine, DEXILANT, fluorometholone, metFORMIN, and Vitamin D (Ergocalciferol).  No orders of the defined types  were placed in this encounter.   Problem List Items Addressed This Visit      Unprioritized   HTN (hypertension) - Primary    Well controlled, no changes to meds. Encouraged heart healthy diet such as the DASH diet and exercise as tolerated.  Labs from healthy weight and wellness reviewed rto cpe          Follow-up: Return if symptoms worsen or fail to improve.  [name], DO

## 2018-04-23 ENCOUNTER — Ambulatory Visit (HOSPITAL_BASED_OUTPATIENT_CLINIC_OR_DEPARTMENT_OTHER)
Admission: RE | Admit: 2018-04-23 | Discharge: 2018-04-23 | Disposition: A | Discharge status: Home / Self Care | Payer: BC Managed Care – PPO | Source: Ambulatory Visit | Attending: Family Medicine | Admitting: Family Medicine

## 2018-04-23 DIAGNOSIS — Z1231 Encounter for screening mammogram for malignant neoplasm of breast: Secondary | ICD-10-CM

## 2018-04-24 ENCOUNTER — Ambulatory Visit (HOSPITAL_BASED_OUTPATIENT_CLINIC_OR_DEPARTMENT_OTHER): Payer: BC Managed Care – PPO

## 2018-05-01 ENCOUNTER — Encounter (INDEPENDENT_AMBULATORY_CARE_PROVIDER_SITE_OTHER): Payer: Self-pay | Admitting: Physician Assistant

## 2018-05-01 ENCOUNTER — Ambulatory Visit (INDEPENDENT_AMBULATORY_CARE_PROVIDER_SITE_OTHER): Payer: BC Managed Care – PPO | Admitting: Physician Assistant

## 2018-05-01 VITALS — BP 112/74 | HR 69 | Temp 97.9°F | Ht 65.0 in | Wt 224.0 lb

## 2018-05-01 DIAGNOSIS — R7303 Prediabetes: Secondary | ICD-10-CM | POA: Diagnosis not present

## 2018-05-01 DIAGNOSIS — Z6837 Body mass index (BMI) 37.0-37.9, adult: Secondary | ICD-10-CM | POA: Diagnosis not present

## 2018-05-05 NOTE — Progress Notes (Signed)
Office: [phone]  /  Fax: [phone]   HPI:   Chief Complaint: OBESITY Emily Osborne is here to discuss her progress with her obesity treatment plan. She is on the Category 2 plan and is following her eating plan approximately 50 % of the time. She states she is walking 45 minutes 1 time per week. Emily Osborne did very well with weight loss. She reports that she continues to struggle with getting in all of her protein. Her weight is 224 lb (101.6 kg) today and has had a weight loss of 2 pounds over a period of 3 weeks since herlast visit. She has lost 2 lbs since starting treatment with [place].  Pre-Diabetes Emily Osborne has a diagnosis of pre-diabetes based on her elevated Hgb A1c and was informed that this puts her at greater risk of developing diabetes. She is taking metformin currently and continues to work on diet and exercise to decrease risk of diabetes. She denies polyphagia or hypoglycemia.   ALLERGIES: Allergies  Allergen Reactions  . Aspirin Other (See Comments)    REACTION: anaphylaxis  . Gentamicin Other (See Comments)    Eye drops turned the sclera bright red  . Ibuprofen Other (See Comments)    REACTION: anaphylaxsis  . Metronidazole Swelling    REACTION: red face/swelling  . Nsaids Other (See Comments)    REACTION: anaphylaxis  . Doxycycline Other (See Comments)    REACTION: severe nausea/vomiting  . Influenza A (H1n1) Monoval Vac Other (See Comments)    REACTION: sick for 3 weeks  . Loratadine Other (See Comments)    Fatigue/weakness  . Periactin [Cyproheptadine] Other (See Comments)    paranoid   . Ranitidine Hcl Other (See Comments)    REACTION: Lips turned red /peel  . Sulfamethoxazole-Trimethoprim Other (See Comments)    REACTION: face red/peel  . Tramadol Hcl Other (See Comments)    REACTION: paranoid    MEDICATIONS: Current Outpatient Medications on File Prior to Visit  Medication Sig Dispense Refill  . acetaminophen (TYLENOL) 500 MG tablet Take 1,000 mg by  mouth every 6 (six) hours as needed for moderate pain.     Marland Kitchen ADVAIR DISKUS 250-50 MCG/DOSE AEPB TAKE 1 PUFF BY MOUTH TWICE A DAY 60 each 2  . albuterol (PROAIR HFA) 108 (90 Base) MCG/ACT inhaler USE 2 PUFFS EVERY 4 HOURS AS NEEDED FOR COUGH, WHEEZE OR SHORTNESS OF BREATH. 8.5 each 2  . ALPRAZolam (XANAX) 0.25 MG tablet Take 1 tablet (0.25 mg total) by mouth 3 (three) times daily as needed. 30 tablet 2  . atenolol (TENORMIN) 25 MG tablet Take 0.5 tablets (12.5 mg total) by mouth daily. Takes 0.5 tablet 45 tablet 3  . azelastine (OPTIVAR) 0.05 % ophthalmic solution Place 1 drop into both eyes 2 (two) times daily. 6 mL 12  . Blood Glucose Monitoring Suppl (FREESTYLE FREEDOM) KIT 1 Device by Does not apply route daily. 1 each 0  . DEXILANT 60 MG capsule TAKE ONE CAPSULE BY MOUTH DAILY 90 capsule 1  . fexofenadine (ALLEGRA) 180 MG tablet Take 1 tablet (180 mg total) by mouth daily. 90 tablet 3  . Flaxseed, Linseed, (FLAXSEED OIL PO) Take by mouth. One capsule daily    . fluorometholone (FML) 0.1 % ophthalmic suspension Place 1 drop into the right eye as needed.     . fluticasone (FLONASE) 50 MCG/ACT nasal spray PLACE 2 SPRAYS INTO BOTH NOSTRILS DAILY. 16 g 4  . FREESTYLE LITE test strip USE TO CHECK BLOOD SUGAR ONCE A DAY 100 each  0  . Iron Combinations (I.L.X. B-12) ELIX Take 1 each by mouth daily as needed (energy). 1 droplet full    . metFORMIN (GLUCOPHAGE) 500 MG tablet Take 1 tablet (500 mg total) by mouth daily with breakfast. 30 tablet 0  . Multiple Vitamins-Minerals (MULTIVITAMIN WITH MINERALS) tablet Take 1 tablet by mouth daily.    . valACYclovir (VALTREX) 1000 MG tablet Take 1 tablet (1,000 mg total) by mouth 3 (three) times daily. (Patient taking differently: Take 1,000 mg by mouth as needed (3 times daily as needed). As needed) 30 tablet 5  . Vitamin D, Ergocalciferol, (DRISDOL) 50000 units CAPS capsule Take 1 capsule (50,000 Units total) by mouth every 7 (seven) days. 4 capsule 0   No  current facility-administered medications on file prior to visit.     PAST MEDICAL HISTORY: Past Medical History:  Diagnosis Date  . Allergy   . Anxiety   . Asthma    pt has inhaler  . Back pain   . C. difficile colitis   . Cataract    bil eyes  . Chest pain   . Chronic fatigue syndrome   . Clostridium difficile colitis 12/13/2016  . Diverticulitis   . Dyspnea   . Dysrhythmia    palpitations  . Edema   . Elevated blood pressure reading without diagnosis of hypertension    "just elevated when I'm in pain" (12/07/2015)  . Fatty liver   . Fibromyalgia   . Ganglion of joint    right wrist  . GERD (gastroesophageal reflux disease)   . H. pylori infection   . Headache    hx of  . Helicobacter pylori (H. pylori) infection 12/13/2016  . Herpes zoster without mention of complication   . HTN (hypertension)   . Hyperlipidemia   . Hyperthyroidism   . IBS (irritable bowel syndrome)   . Leg edema   . Multiple drug allergies 06/18/2017  . Myalgia   . Nontraumatic rupture of Achilles tendon   . Other acute reactions to stress   . Pain in joint, shoulder region   . Pain in joint, upper arm   . Pain in limb   . Palpitations   . Personal history of other diseases of digestive system   . Pneumonia    once  . PONV (postoperative nausea and vomiting)   . Routine screening for STI (sexually transmitted infection) 12/13/2016  . S/P partial colectomy 06/18/2017  . Thyroid disease    hyperthyroidism  . Vitamin B 12 deficiency     PAST SURGICAL HISTORY: Past Surgical History:  Procedure Laterality Date  . ABDOMINAL HYSTERECTOMY    . ACHILLES TENDON REPAIR Right 2005  . BUNIONECTOMY Bilateral    Bunionectomy 1983  . COLON SURGERY  01/23/2017   6 to 8 inches sigmoid colon removed  . GANGLION CYST EXCISION Right    wrist  . LEFT HEART CATH AND CORONARY ANGIOGRAPHY N/A 10/03/2016   Procedure: Left Heart Cath and Coronary Angiography;  Surgeon: Burnell Blanks, MD;  Location:  [facility];  Service: Cardiovascular;  Laterality: N/A;  . MENISCUS REPAIR Right 04/2014  . TUBAL LIGATION    . wisdomteeth extraction      SOCIAL HISTORY: Social History   Tobacco Use  . Smoking status: Never Smoker  . Smokeless tobacco: Never Used  Substance Use Topics  . Alcohol use: No    Alcohol/week: 0.0 standard drinks  . Drug use: No    FAMILY HISTORY: Family History  Problem  Relation Age of Onset  . Hypertension Father   . Diabetes Father   . Heart disease Father   . Kidney disease Father        Died, 37  . Hyperlipidemia Father   . Leukemia Brother   . Cancer Brother        myleoblastic anemia  . Hyperlipidemia Mother   . Hypertension Mother        Died, 20  . Thyroid disease Mother        Thyroid surgery  . Obesity Mother   . Heart disease Mother   . Pernicious anemia Sister   . Thyroid disease Sister        On thyroid Rx  . Lupus Daughter   . Coronary artery disease Brother   . Hypertension Brother   . Colon cancer Neg Hx   . Esophageal cancer Neg Hx   . Rectal cancer Neg Hx   . Stomach cancer Neg Hx   . Pancreatic cancer Neg Hx   . Prostate cancer Neg Hx     ROS: Review of Systems  Constitutional: Positive for weight loss.  Genitourinary:       Negative for polyphagia.  Endo/Heme/Allergies:       Negative for hypoglycemia.    PHYSICAL EXAM: Blood pressure 112/74, pulse 69, temperature 97.9 F (36.6 C), temperature source Oral, height _0  (1.651 m), weight 224 lb (101.6 kg), SpO2 97 %. Body mass index is 37.28 kg/m. Physical Exam  Constitutional: She is oriented to person, place, and time. She appears well-developed and well-nourished.  Cardiovascular: Normal rate.  Pulmonary/Chest: Effort normal.  Musculoskeletal: Normal range of motion.  Neurological: She is oriented to person, place, and time.  Skin: Skin is warm and dry.  Psychiatric: She has a normal mood and affect. Her behavior is normal.  Vitals  reviewed.   RECENT LABS AND TESTS: BMET    Component Value Date/Time   NA 142 03/20/2018 1046   K 4.3 03/20/2018 1046   CL 105 03/20/2018 1046   CO2 23 03/20/2018 1046   GLUCOSE 92 03/20/2018 1046   GLUCOSE 82 10/11/2017 1027   BUN 17 03/20/2018 1046   CREATININE 0.96 03/20/2018 1046   CREATININE 0.97 03/23/2016 1501   CALCIUM 9.3 03/20/2018 1046   GFRNONAA 65 03/20/2018 1046   GFRAA 75 03/20/2018 1046   Lab Results  Component Value Date   HGBA1C 5.6 03/20/2018   HGBA1C 5.8 (H) 12/12/2017   HGBA1C 5.5 01/15/2017   HGBA1C 5.9 (H) 12/07/2015   HGBA1C 6.1 03/06/2011   Lab Results  Component Value Date   INSULIN 20.0 03/20/2018   INSULIN 17.6 12/12/2017   CBC    Component Value Date/Time   WBC 4.5 12/12/2017 1102   WBC 11.0 (H) 08/22/2017 1637   RBC 4.72 12/12/2017 1102   RBC 4.38 08/22/2017 1637   HGB 13.5 12/12/2017 1102   HCT 41.8 12/12/2017 1102   PLT 255.0 08/22/2017 1637   MCV 89 12/12/2017 1102   MCH 28.6 12/12/2017 1102   MCH 28.9 02/08/2017 0909   MCHC 32.3 12/12/2017 1102   MCHC 32.2 08/22/2017 1637   RDW 15.1 12/12/2017 1102   LYMPHSABS 2.3 12/12/2017 1102   MONOABS 0.5 04/04/2017 1045   EOSABS 0.1 12/12/2017 1102   BASOSABS 0.0 12/12/2017 1102   Iron/TIBC/Ferritin/ %Sat No results found for: IRON, TIBC, FERRITIN, IRONPCTSAT Lipid Panel     Component Value Date/Time   CHOL 197 03/20/2018 1046   TRIG 78 03/20/2018 1046  HDL 57 03/20/2018 1046   CHOLHDL 4 10/11/2017 1027   VLDL 20.6 10/11/2017 1027   LDLCALC 124 (H) 03/20/2018 1046   LDLDIRECT 139.4 12/26/2011 0905   Hepatic Function Panel     Component Value Date/Time   PROT 6.8 03/20/2018 1046   ALBUMIN 4.4 03/20/2018 1046   AST 39 03/20/2018 1046   ALT 32 03/20/2018 1046   ALKPHOS 64 03/20/2018 1046   BILITOT 0.4 03/20/2018 1046   BILIDIR 0.1 09/07/2014 1031      Component Value Date/Time   TSH 0.83 11/13/2017 0847   TSH 0.90 05/07/2017 1145   TSH 1.11 10/05/2016 1523    Results for Mccardle, Emily W "Emily Osborne" (MRN [id number]) as of 05/05/2018 07:47  Ref. Range 03/20/2018 10:46  Vitamin D, 25-Hydroxy Latest Ref Range: 30.0 - 100.0 ng/mL 31.0   ASSESSMENT AND PLAN: Prediabetes  Class 2 severe obesity with serious comorbidity and body mass index (BMI) of 37.0 to 37.9 in adult, unspecified obesity type [facility])  PLAN:   Pre-Diabetes Emily Osborne will continue to work on weight loss, exercise, and decreasing simple carbohydrates in her diet to help decrease the risk of diabetes. She was informed that eating too many simple carbohydrates or too many calories at one sitting increases the likelihood of GI side effects. Emily Osborne is taking metformin now and a prescription was not written today. Emily Osborne agreed to continue with diet, exercise and weight loss and will follow up with [place] as directed to monitor her progress.  I spent > than 50% of the 25 minute visit on counseling as documented in the note.  Obesity Emily Osborne is currently in the action stage of change. As such, her goal is to continue with weight loss efforts. She has agreed to follow the Category 2 plan. Emily Osborne has been instructed to work up to a goal of 150 minutes of combined cardio and strengthening exercise per week for weight loss and overall health benefits. We discussed the following Behavioral Modification Strategies today: no skipping meals and work on meal planning and easy cooking plans.  Emily Osborne has agreed to follow up with our clinic in 2 weeks. She was informed of the importance of frequent follow up visits to maximize her success with intensive lifestyle modifications for her multiple health conditions.   OBESITY BEHAVIORAL INTERVENTION VISIT  Today's visit was # 8  Starting weight: 226 lbs Starting date: 12/12/17 Today's weight : Weight: 224 lb (101.6 kg)  Today's date: 05/01/2018 Total lbs lost to date: 2  ASK: We discussed the diagnosis of obesity with Emily Osborne today and Emily Osborne agreed to  give [place] permission to discuss obesity behavioral modification therapy today.  ASSESS: Emily Osborne has the diagnosis of obesity and her BMI today is 37.28. Emily Osborne is in the action stage of change.   ADVISE: Emily Osborne was educated on the multiple health risks of obesity as well as the benefit of weight loss to improve her health. She was advised of the need for long term treatment and the importance of lifestyle modifications to improve her current health and to decrease her risk of future health problems.  AGREE: Multiple dietary modification options and treatment options were discussed and Emily Osborne agreed to follow the recommendations documented in the above note.  ARRANGE: Emily Osborne was educated on the importance of frequent visits to treat obesity as outlined per CMS and USPSTF guidelines and agreed to schedule her next follow up appointment today.  I, Marcille Blanco, am acting as transcriptionist for Abby Potash, PA-C I, Linus Orn  Pricilla Holm, PA-C have reviewed above note and agree with its content

## 2018-05-07 ENCOUNTER — Ambulatory Visit: Payer: BC Managed Care – PPO | Admitting: Internal Medicine

## 2018-05-07 DIAGNOSIS — Z0289 Encounter for other administrative examinations: Secondary | ICD-10-CM

## 2018-05-07 NOTE — Progress Notes (Deleted)
Patient ID: Emily Osborne, female   DOB: 23-Jan-1961, 57 y.o.   MRN: [id number]    HPI  Emily Osborne is a very pleasant 57 y.o.-year-old female, returning for f/u for Graves' disease. Last visit a year ago.  I reviewed patient's TFTs -they have been normal in the last 2 years: Lab Results  Component Value Date   TSH 0.83 11/13/2017   TSH 0.90 05/07/2017   TSH 1.11 10/05/2016   TSH 1.06 06/28/2016   TSH 1.30 04/27/2016   TSH 0.90 03/23/2016   TSH 0.01 (L) 12/27/2015   TSH 0.01 (L) 12/14/2015   TSH 0.021 (L) 12/07/2015   TSH 0.11 (L) 10/31/2015   FREET4 0.75 11/13/2017   FREET4 0.76 05/07/2017   FREET4 0.78 10/05/2016   FREET4 0.74 06/28/2016   FREET4 0.76 04/27/2016   FREET4 1.61 (H) 12/27/2015   FREET4 1.34 (H) 12/08/2015   FREET4 1.18 11/02/2015   FREET4 0.90 10/28/2013   FREET4 0.8 12/27/2009    Her Graves' antibodies normalized at last visit: Lab Results  Component Value Date   TSI <89 05/07/2017   TSI 393 (H) 12/27/2015   We started methimazole 5 mg daily initially >> subsequent TFTs have normalized.  She continues on methimazole 2.5 mg daily now.  She tolerates this well.  We also started atenolol 25 mg 2X a day >> tried to come off >> restarted in 08/2016 as she had intermittent chest pain >> continues on atenolol 25 mg daily now.  She had a cardiac cath 09/2016: No pathology  Pt denies: - feeling nodules in neck - hoarseness - dysphagia - choking - SOB with lying down  Pt denies: - fatigue - weight loss - heat intolerance - tremors - palpitations - anxiety - hyperdefecation - hair loss  Pt does not have a FH of thyroid ds. (mother, sister).No FH of thyroid cancer. No h/o radiation tx to head or neck.  No seaweed or kelp. No recent contrast studies. No herbal supplements. No Biotin use. No recent steroids use.   She does have a history of B12 deficiency.   ROS: Constitutional: + See HPI Eyes: no blurry vision, no xerophthalmia ENT: no sore  throat, + see HPI Cardiovascular: no CP/no SOB/no palpitations/no leg swelling Respiratory: no cough/no SOB/no wheezing Gastrointestinal: no N/no V/no D/no C/no acid reflux Musculoskeletal: no muscle aches/no joint aches Skin: no rashes, no hair loss Neurological: no tremors/no numbness/no tingling/no dizziness  I reviewed pt's medications, allergies, PMH, social hx, family hx, and changes were documented in the history of present illness. Otherwise, unchanged from my initial visit note.  Past Medical History:  Diagnosis Date  . Allergy   . Anxiety   . Asthma    pt has inhaler  . Back pain   . C. difficile colitis   . Cataract    bil eyes  . Chest pain   . Chronic fatigue syndrome   . Clostridium difficile colitis 12/13/2016  . Diverticulitis   . Dyspnea   . Dysrhythmia    palpitations  . Edema   . Elevated blood pressure reading without diagnosis of hypertension    "just elevated when I'm in pain" (12/07/2015)  . Fatty liver   . Fibromyalgia   . Ganglion of joint    right wrist  . GERD (gastroesophageal reflux disease)   . H. pylori infection   . Headache    hx of  . Helicobacter pylori (H. pylori) infection 12/13/2016  . Herpes zoster without mention of  complication   . HTN (hypertension)   . Hyperlipidemia   . Hyperthyroidism   . IBS (irritable bowel syndrome)   . Leg edema   . Multiple drug allergies 06/18/2017  . Myalgia   . Nontraumatic rupture of Achilles tendon   . Other acute reactions to stress   . Pain in joint, shoulder region   . Pain in joint, upper arm   . Pain in limb   . Palpitations   . Personal history of other diseases of digestive system   . Pneumonia    once  . PONV (postoperative nausea and vomiting)   . Routine screening for STI (sexually transmitted infection) 12/13/2016  . S/P partial colectomy 06/18/2017  . Thyroid disease    hyperthyroidism  . Vitamin B 12 deficiency    Past Surgical History:  Procedure Laterality Date  .  ABDOMINAL HYSTERECTOMY    . ACHILLES TENDON REPAIR Right 2005  . BUNIONECTOMY Bilateral    Bunionectomy 1983  . COLON SURGERY  01/23/2017   6 to 8 inches sigmoid colon removed  . GANGLION CYST EXCISION Right    wrist  . LEFT HEART CATH AND CORONARY ANGIOGRAPHY N/A 10/03/2016   Procedure: Left Heart Cath and Coronary Angiography;  Surgeon: Burnell Blanks, MD;  Location: [facility];  Service: Cardiovascular;  Laterality: N/A;  . MENISCUS REPAIR Right 04/2014  . TUBAL LIGATION    . wisdomteeth extraction     Social History   Socioeconomic History  . Marital status: Divorced    Spouse name: Not on file  . Number of children: 2  . Years of education: Not on file  . Highest education level: Not on file  Occupational History  . Occupation: Personnel officer  Social Needs  . Financial resource strain: Not on file  . Food insecurity:    Worry: Not on file    Inability: Not on file  . Transportation needs:    Medical: Not on file    Non-medical: Not on file  Tobacco Use  . Smoking status: Never Smoker  . Smokeless tobacco: Never Used  Substance and Sexual Activity  . Alcohol use: No    Alcohol/week: 0.0 standard drinks  . Drug use: No  . Sexual activity: Not Currently    Partners: Male  Lifestyle  . Physical activity:    Days per week: Not on file    Minutes per session: Not on file  . Stress: Not on file  Relationships  . Social connections:    Talks on phone: Not on file    Gets together: Not on file    Attends religious service: Not on file    Active member of club or organization: Not on file    Attends meetings of clubs or organizations: Not on file    Relationship status: Not on file  . Intimate partner violence:    Fear of current or ex partner: Not on file    Emotionally abused: Not on file    Physically abused: Not on file    Forced sexual activity: Not on file  Other Topics Concern  . Not on file  Social History Narrative   Lives with alone.   She has two daughter.   She works as a Programme researcher, broadcasting/film/video at [employer]--- no   Current Outpatient Medications on File Prior to Visit  Medication Sig Dispense Refill  . acetaminophen (TYLENOL) 500 MG tablet Take 1,000 mg by mouth every 6 (six) hours as needed  for moderate pain.     Marland Kitchen ADVAIR DISKUS 250-50 MCG/DOSE AEPB TAKE 1 PUFF BY MOUTH TWICE A DAY 60 each 2  . albuterol (PROAIR HFA) 108 (90 Base) MCG/ACT inhaler USE 2 PUFFS EVERY 4 HOURS AS NEEDED FOR COUGH, WHEEZE OR SHORTNESS OF BREATH. 8.5 each 2  . ALPRAZolam (XANAX) 0.25 MG tablet Take 1 tablet (0.25 mg total) by mouth 3 (three) times daily as needed. 30 tablet 2  . atenolol (TENORMIN) 25 MG tablet Take 0.5 tablets (12.5 mg total) by mouth daily. Takes 0.5 tablet 45 tablet 3  . azelastine (OPTIVAR) 0.05 % ophthalmic solution Place 1 drop into both eyes 2 (two) times daily. 6 mL 12  . Blood Glucose Monitoring Suppl (FREESTYLE FREEDOM) KIT 1 Device by Does not apply route daily. 1 each 0  . DEXILANT 60 MG capsule TAKE ONE CAPSULE BY MOUTH DAILY 90 capsule 1  . fexofenadine (ALLEGRA) 180 MG tablet Take 1 tablet (180 mg total) by mouth daily. 90 tablet 3  . Flaxseed, Linseed, (FLAXSEED OIL PO) Take by mouth. One capsule daily    . fluorometholone (FML) 0.1 % ophthalmic suspension Place 1 drop into the right eye as needed.     . fluticasone (FLONASE) 50 MCG/ACT nasal spray PLACE 2 SPRAYS INTO BOTH NOSTRILS DAILY. 16 g 4  . FREESTYLE LITE test strip USE TO CHECK BLOOD SUGAR ONCE A DAY 100 each 0  . Iron Combinations (I.L.X. B-12) ELIX Take 1 each by mouth daily as needed (energy). 1 droplet full    . metFORMIN (GLUCOPHAGE) 500 MG tablet Take 1 tablet (500 mg total) by mouth daily with breakfast. 30 tablet 0  . Multiple Vitamins-Minerals (MULTIVITAMIN WITH MINERALS) tablet Take 1 tablet by mouth daily.    . valACYclovir (VALTREX) 1000 MG tablet Take 1 tablet (1,000 mg total) by mouth 3 (three) times daily. (Patient taking differently:  Take 1,000 mg by mouth as needed (3 times daily as needed). As needed) 30 tablet 5  . Vitamin D, Ergocalciferol, (DRISDOL) 50000 units CAPS capsule Take 1 capsule (50,000 Units total) by mouth every 7 (seven) days. 4 capsule 0   No current facility-administered medications on file prior to visit.    Allergies  Allergen Reactions  . Aspirin Other (See Comments)    REACTION: anaphylaxis  . Gentamicin Other (See Comments)    Eye drops turned the sclera bright red  . Ibuprofen Other (See Comments)    REACTION: anaphylaxsis  . Metronidazole Swelling    REACTION: red face/swelling  . Nsaids Other (See Comments)    REACTION: anaphylaxis  . Doxycycline Other (See Comments)    REACTION: severe nausea/vomiting  . Influenza A (H1n1) Monoval Vac Other (See Comments)    REACTION: sick for 3 weeks  . Loratadine Other (See Comments)    Fatigue/weakness  . Periactin [Cyproheptadine] Other (See Comments)    paranoid   . Ranitidine Hcl Other (See Comments)    REACTION: Lips turned red /peel  . Sulfamethoxazole-Trimethoprim Other (See Comments)    REACTION: face red/peel  . Tramadol Hcl Other (See Comments)    REACTION: paranoid   Family History  Problem Relation Age of Onset  . Hypertension Father   . Diabetes Father   . Heart disease Father   . Kidney disease Father        Died, 58  . Hyperlipidemia Father   . Leukemia Brother   . Cancer Brother        myleoblastic anemia  . Hyperlipidemia  Mother   . Hypertension Mother        Died, 16  . Thyroid disease Mother        Thyroid surgery  . Obesity Mother   . Heart disease Mother   . Pernicious anemia Sister   . Thyroid disease Sister        On thyroid Rx  . Lupus Daughter   . Coronary artery disease Brother   . Hypertension Brother   . Colon cancer Neg Hx   . Esophageal cancer Neg Hx   . Rectal cancer Neg Hx   . Stomach cancer Neg Hx   . Pancreatic cancer Neg Hx   . Prostate cancer Neg Hx    PE: There were no vitals taken  for this visit. Wt Readings from Last 3 Encounters:  05/01/18 224 lb (101.6 kg)  04/18/18 227 lb (103 kg)  04/10/18 226 lb (102.5 kg)   Constitutional: overweight, in NAD Eyes: PERRLA, EOMI, no exophthalmos ENT: moist mucous membranes, no thyromegaly, no cervical lymphadenopathy Cardiovascular: RRR, No MRG Respiratory: CTA B Gastrointestinal: abdomen soft, NT, ND, BS+ Musculoskeletal: no deformities, strength intact in all 4 Skin: moist, warm, no rashes Neurological: no tremor with outstretched hands, DTR normal in all 4  ASSESSMENT: 1. Graves ds.  PLAN:  1. Patient with Graves' disease, initially with few thyrotoxic symptoms: Weight loss, some heat intolerance, tremors, palpitations, all improved after starting methimazole.  We were able to decrease the dose of methimazole to 2.5 mg daily.  She is tolerating this dose well, without side effects. -At last visit, we checked her TSI antibodies and this decreased significantly and they were undetectable.  We discussed that this is a very good sign that the activity of her Graves' disease has greatly decreased.  We discussed that we could potentially stop methimazole soon.  No indication that we need definitive treatment for her Graves' disease: RAI ablation or surgery. -She continues atenolol mostly for chest pain but not necessarily for tachycardia caused by Graves' disease, since her TFTs have been normal in the last 2 years.  It is unclear whether she gets a placebo effect from atenolol.  We will continue the medication for now.  She had a cardiac catheterization last year that did not show any obstructions. -We will recheck her TFTs now -I will see her back in a year  - time spent with the patient: 15 minutes, of which >50% was spent in obtaining information about her symptoms, reviewing her previous labs, evaluations, and treatments, counseling her about her condition (please see the discussed topics above), and developing a plan to  further investigate and treat it; she had a number of questions which I addressed.  Philemon Kingdom, MD PhD [facility]

## 2018-05-12 ENCOUNTER — Other Ambulatory Visit: Payer: Self-pay | Admitting: Internal Medicine

## 2018-05-12 NOTE — Telephone Encounter (Signed)
ok 

## 2018-05-12 NOTE — Telephone Encounter (Signed)
Ok to fill 

## 2018-05-13 ENCOUNTER — Encounter: Payer: Self-pay | Admitting: Family Medicine

## 2018-05-13 ENCOUNTER — Ambulatory Visit: Payer: BC Managed Care – PPO | Admitting: Family Medicine

## 2018-05-13 VITALS — BP 118/80 | HR 69 | Temp 98.1°F | Resp 16 | Ht 65.0 in | Wt 230.4 lb

## 2018-05-13 DIAGNOSIS — R110 Nausea: Secondary | ICD-10-CM | POA: Diagnosis not present

## 2018-05-13 LAB — POC URINALSYSI DIPSTICK (AUTOMATED)
BILIRUBIN UA: NEGATIVE
GLUCOSE UA: NEGATIVE
Ketones, UA: NEGATIVE
Leukocytes, UA: NEGATIVE
NITRITE UA: NEGATIVE
PH UA: 6 (ref 5.0–8.0)
Protein, UA: NEGATIVE
RBC UA: NEGATIVE
SPEC GRAV UA: 1.025 (ref 1.010–1.025)
UROBILINOGEN UA: 0.2 U/dL

## 2018-05-13 LAB — GLUCOSE, POCT (MANUAL RESULT ENTRY): POC Glucose: 123 mg/dl — AB (ref 70–99)

## 2018-05-13 NOTE — Progress Notes (Signed)
Patient ID: Emily Osborne, female    DOB: 03-Jan-1961  Age: 57 y.o. MRN: [id number]    Subjective:  Subjective  HPI Emily Osborne presents for nausea and inc gerd x .1 week.   No vomiting.  She has been under a lot of stress  Review of Systems  Constitutional: Negative for chills and fever.  HENT: Negative for congestion and hearing loss.   Eyes: Negative for discharge.  Respiratory: Negative for cough and shortness of breath.   Cardiovascular: Negative for palpitations and leg swelling.  Gastrointestinal: Positive for nausea. Negative for abdominal pain, blood in stool, constipation, diarrhea and vomiting.  Genitourinary: Negative for dysuria, frequency, hematuria and urgency.  Musculoskeletal: Negative for back pain and myalgias.  Skin: Negative for rash.  Allergic/Immunologic: Negative for environmental allergies.  Neurological: Negative for dizziness, weakness and headaches.  Hematological: Does not bruise/bleed easily.  Psychiatric/Behavioral: Negative for suicidal ideas. The patient is not nervous/anxious.     History Past Medical History:  Diagnosis Date  . Allergy   . Anxiety   . Asthma    pt has inhaler  . Back pain   . C. difficile colitis   . Cataract    bil eyes  . Chest pain   . Chronic fatigue syndrome   . Clostridium difficile colitis 12/13/2016  . Diverticulitis   . Dyspnea   . Dysrhythmia    palpitations  . Edema   . Elevated blood pressure reading without diagnosis of hypertension    "just elevated when I'm in pain" (12/07/2015)  . Fatty liver   . Fibromyalgia   . Ganglion of joint    right wrist  . GERD (gastroesophageal reflux disease)   . H. pylori infection   . Headache    hx of  . Helicobacter pylori (H. pylori) infection 12/13/2016  . Herpes zoster without mention of complication   . HTN (hypertension)   . Hyperlipidemia   . Hyperthyroidism   . IBS (irritable bowel syndrome)   . Leg edema   . Multiple drug allergies 06/18/2017  .  Myalgia   . Nontraumatic rupture of Achilles tendon   . Other acute reactions to stress   . Pain in joint, shoulder region   . Pain in joint, upper arm   . Pain in limb   . Palpitations   . Personal history of other diseases of digestive system   . Pneumonia    once  . PONV (postoperative nausea and vomiting)   . Routine screening for STI (sexually transmitted infection) 12/13/2016  . S/P partial colectomy 06/18/2017  . Thyroid disease    hyperthyroidism  . Vitamin B 12 deficiency     She has a past surgical history that includes Abdominal hysterectomy; Tubal ligation; Bunionectomy (Bilateral); Achilles tendon repair (Right, 2005); Ganglion cyst excision (Right); Meniscus repair (Right, 04/2014); wisdomteeth extraction; LEFT HEART CATH AND CORONARY ANGIOGRAPHY (N/A, 10/03/2016); and Colon surgery (01/23/2017).   Her family history includes Cancer in her brother; Coronary artery disease in her brother; Diabetes in her father; Heart disease in her father and mother; Hyperlipidemia in her father and mother; Hypertension in her brother, father, and mother; Kidney disease in her father; Leukemia in her brother; Lupus in her daughter; Obesity in her mother; Pernicious anemia in her sister; Thyroid disease in her mother and sister.She reports that she has never smoked. She has never used smokeless tobacco. She reports that she does not drink alcohol or use drugs.  Current Outpatient Medications on File Prior  to Visit  Medication Sig Dispense Refill  . acetaminophen (TYLENOL) 500 MG tablet Take 1,000 mg by mouth every 6 (six) hours as needed for moderate pain.     Marland Kitchen ADVAIR DISKUS 250-50 MCG/DOSE AEPB TAKE 1 PUFF BY MOUTH TWICE A DAY 60 each 2  . albuterol (PROAIR HFA) 108 (90 Base) MCG/ACT inhaler USE 2 PUFFS EVERY 4 HOURS AS NEEDED FOR COUGH, WHEEZE OR SHORTNESS OF BREATH. 8.5 each 2  . ALPRAZolam (XANAX) 0.25 MG tablet Take 1 tablet (0.25 mg total) by mouth 3 (three) times daily as needed. 30  tablet 2  . atenolol (TENORMIN) 25 MG tablet TAKE 1/2 TABLETS (12.5 MG TOTAL) BY MOUTH DAILY 45 tablet 3  . azelastine (OPTIVAR) 0.05 % ophthalmic solution Place 1 drop into both eyes 2 (two) times daily. 6 mL 12  . Blood Glucose Monitoring Suppl (FREESTYLE FREEDOM) KIT 1 Device by Does not apply route daily. 1 each 0  . DEXILANT 60 MG capsule TAKE ONE CAPSULE BY MOUTH DAILY 90 capsule 1  . fexofenadine (ALLEGRA) 180 MG tablet Take 1 tablet (180 mg total) by mouth daily. 90 tablet 3  . Flaxseed, Linseed, (FLAXSEED OIL PO) Take by mouth. One capsule daily    . fluorometholone (FML) 0.1 % ophthalmic suspension Place 1 drop into the right eye as needed.     . fluticasone (FLONASE) 50 MCG/ACT nasal spray PLACE 2 SPRAYS INTO BOTH NOSTRILS DAILY. 16 g 4  . FREESTYLE LITE test strip USE TO CHECK BLOOD SUGAR ONCE A DAY 100 each 0  . Iron Combinations (I.L.X. B-12) ELIX Take 1 each by mouth daily as needed (energy). 1 droplet full    . metFORMIN (GLUCOPHAGE) 500 MG tablet Take 1 tablet (500 mg total) by mouth daily with breakfast. 30 tablet 0  . Multiple Vitamins-Minerals (MULTIVITAMIN WITH MINERALS) tablet Take 1 tablet by mouth daily.    . valACYclovir (VALTREX) 1000 MG tablet Take 1 tablet (1,000 mg total) by mouth 3 (three) times daily. (Patient taking differently: Take 1,000 mg by mouth as needed (3 times daily as needed). As needed) 30 tablet 5  . Vitamin D, Ergocalciferol, (DRISDOL) 50000 units CAPS capsule Take 1 capsule (50,000 Units total) by mouth every 7 (seven) days. 4 capsule 0   No current facility-administered medications on file prior to visit.      Objective:  Objective  Physical Exam  Constitutional: She is oriented to person, place, and time. She appears well-developed and well-nourished.  HENT:  Head: Normocephalic and atraumatic.  Eyes: Conjunctivae and EOM are normal.  Neck: Normal range of motion. Neck supple. No JVD present. Carotid bruit is not present. No thyromegaly  present.  Cardiovascular: Normal rate, regular rhythm and normal heart sounds.  No murmur heard. Pulmonary/Chest: Effort normal and breath sounds normal. No respiratory distress. She has no wheezes. She has no rales. She exhibits no tenderness.  Abdominal: Soft. She exhibits no distension. There is no tenderness.  Musculoskeletal: She exhibits no edema.  Neurological: She is alert and oriented to person, place, and time.  Psychiatric: She has a normal mood and affect.  Nursing note and vitals reviewed.  BP 118/80 (BP Location: Right Arm, Cuff Size: Large)   Pulse 69   Temp 98.1 F (36.7 C) (Oral)   Resp 16   Ht 5' 5" (1.651 m)   Wt 230 lb 6.4 oz (104.5 kg)   SpO2 97%   BMI 38.34 kg/m  Wt Readings from Last 3 Encounters:  05/13/18 230  lb 6.4 oz (104.5 kg)  05/01/18 224 lb (101.6 kg)  04/18/18 227 lb (103 kg)     Lab Results  Component Value Date   WBC 4.5 12/12/2017   HGB 13.5 12/12/2017   HCT 41.8 12/12/2017   PLT 255.0 08/22/2017   GLUCOSE 92 03/20/2018   CHOL 197 03/20/2018   TRIG 78 03/20/2018   HDL 57 03/20/2018   LDLDIRECT 139.4 12/26/2011   LDLCALC 124 (H) 03/20/2018   ALT 32 03/20/2018   AST 39 03/20/2018   NA 142 03/20/2018   K 4.3 03/20/2018   CL 105 03/20/2018   CREATININE 0.96 03/20/2018   BUN 17 03/20/2018   CO2 23 03/20/2018   TSH 0.83 11/13/2017   INR 0.93 10/03/2016   HGBA1C 5.6 03/20/2018    Mm 3d Screen Breast Bilateral  Result Date: 04/23/2018 CLINICAL DATA:  Screening. EXAM: DIGITAL SCREENING BILATERAL MAMMOGRAM WITH TOMO AND CAD COMPARISON:  Previous exam(s). ACR Breast Density Category b: There are scattered areas of fibroglandular density. FINDINGS: There are no findings suspicious for malignancy. Images were processed with CAD. IMPRESSION: No mammographic evidence of malignancy. A result letter of this screening mammogram will be mailed directly to the patient. RECOMMENDATION: Screening mammogram in one year. (Code:SM-B-01Y) BI-RADS CATEGORY   1: Negative. Electronically Signed   By: Kristopher Oppenheim M.D.   On: 04/23/2018 12:50     Assessment & Plan:  Plan  I am having Rudene Christians "[name]" maintain her acetaminophen, FREESTYLE FREEDOM, I.L.X. B-12, FREESTYLE LITE, albuterol, valACYclovir, (Flaxseed, Linseed, (FLAXSEED OIL PO)), fluticasone, ALPRAZolam, fexofenadine, ADVAIR DISKUS, multivitamin with minerals, azelastine, DEXILANT, fluorometholone, metFORMIN, Vitamin D (Ergocalciferol), and atenolol.  No orders of the defined types were placed in this encounter.   Problem List Items Addressed This Visit    None    Visit Diagnoses    Nausea    -  Primary   Relevant Orders   CBC with Differential/Platelet   Hemoglobin A1c   Comprehensive metabolic panel   POCT Glucose (CBG) (Completed)   POCT Urinalysis Dipstick (Automated) (Completed)   Thyroid Panel With TSH   Ambulatory referral to Gastroenterology    con't dexilant Add pepcid  F/u GI   Follow-up: Return if symptoms worsen or fail to improve.  [name], DO

## 2018-05-13 NOTE — Patient Instructions (Signed)
Nausea, Adult  Nausea is the feeling of an upset stomach or having to vomit. Nausea on its own is not usually a serious concern, but it may be an early sign of a more serious medical problem. As nausea gets worse, it can lead to vomiting. If vomiting develops, or if you are not able to drink enough fluids, you are at risk of becoming dehydrated. Dehydration can make you tired and thirsty, cause you to have a dry mouth, and decrease how often you urinate. Older adults and people with other diseases or a weak immune system are at higher risk for dehydration. The main goals of treating your nausea are:   To limit repeated nausea episodes.   To prevent vomiting and dehydration.    Follow these instructions at home:  Follow instructions from your health care provider about how to care for yourself at home.  Eating and drinking  Follow these recommendations as told by your health care provider:   Take an oral rehydration solution (ORS). This is a drink that is sold at pharmacies and retail stores.   Drink clear fluids in small amounts as you are able. Clear fluids include water, ice chips, diluted fruit juice, and low-calorie sports drinks.   Eat bland, easy-to-digest foods in small amounts as you are able. These foods include bananas, applesauce, rice, lean meats, toast, and crackers.   Avoid drinking fluids that contain a lot of sugar or caffeine, such as energy drinks, sports drinks, and soda.   Avoid alcohol.   Avoid spicy or fatty foods.    General instructions   Drink enough fluid to keep your urine clear or pale yellow.   Wash your hands often. If soap and water are not available, use hand sanitizer.   Make sure that all people in your household wash their hands well and often.   Rest at home while you recover.   Take over-the-counter and prescription medicines only as told by your health care provider.   Breathe slowly and deeply when you feel nauseous.   Watch your condition for any  changes.   Keep all follow-up visits as told by your health care provider. This is important.  Contact a health care provider if:   You have a headache.   You have new symptoms.   Your nausea gets worse.   You have a fever.   You feel light-headed or dizzy.   You vomit.   You cannot keep fluids down.  Get help right away if:   You have pain in your chest, neck, arm, or jaw.   You feel extremely weak or you faint.   You have vomit that is bright red or looks like coffee grounds.   You have bloody or black stools or stools that look like tar.   You have a severe headache, a stiff neck, or both.   You have severe pain, cramping, or bloating in your abdomen.   You have a rash.   You have difficulty breathing or are breathing very quickly.   Your heart is beating very quickly.   Your skin feels cold and clammy.   You feel confused.   You have pain when you urinate.   You have signs of dehydration, such as:  ? Dark urine, very little, or no urine.  ? Cracked lips.  ? Dry mouth.  ? Sunken eyes.  ? Sleepiness.  ? Weakness.  These symptoms may represent a serious problem that is an emergency. Do not wait   to see if the symptoms will go away. Get medical help right away. Call your local emergency services (911 in the [place] Do not drive yourself to the hospital.  This information is not intended to replace advice given to you by your health care provider. Make sure you discuss any questions you have with your health care provider.  Document Released: 09/13/2004 Document Revised: 01/09/2016 Document Reviewed: 04/12/2015  [employer]vier Interactive Patient Education  2018 [employer]

## 2018-05-14 LAB — CBC WITH DIFFERENTIAL/PLATELET
BASOS ABS: 0.1 10*3/uL (ref 0.0–0.1)
BASOS PCT: 0.8 % (ref 0.0–3.0)
EOS ABS: 0.2 10*3/uL (ref 0.0–0.7)
Eosinophils Relative: 3.9 % (ref 0.0–5.0)
HEMATOCRIT: 40.9 % (ref 36.0–46.0)
HEMOGLOBIN: 13.5 g/dL (ref 12.0–15.0)
LYMPHS PCT: 46.8 % — AB (ref 12.0–46.0)
Lymphs Abs: 2.9 10*3/uL (ref 0.7–4.0)
MCHC: 33 g/dL (ref 30.0–36.0)
MCV: 87.4 fl (ref 78.0–100.0)
MONOS PCT: 8.5 % (ref 3.0–12.0)
Monocytes Absolute: 0.5 10*3/uL (ref 0.1–1.0)
NEUTROS ABS: 2.5 10*3/uL (ref 1.4–7.7)
Neutrophils Relative %: 40 % — ABNORMAL LOW (ref 43.0–77.0)
PLATELETS: 244 10*3/uL (ref 150.0–400.0)
RBC: 4.68 Mil/uL (ref 3.87–5.11)
RDW: 14.6 % (ref 11.5–15.5)
WBC: 6.3 10*3/uL (ref 4.0–10.5)

## 2018-05-14 LAB — THYROID PANEL WITH TSH
Free Thyroxine Index: 2.4 (ref 1.4–3.8)
T3 UPTAKE: 28 % (ref 22–35)
T4 TOTAL: 8.7 ug/dL (ref 5.1–11.9)
TSH: 0.74 m[IU]/L (ref 0.40–4.50)

## 2018-05-14 LAB — COMPREHENSIVE METABOLIC PANEL
ALBUMIN: 4.2 g/dL (ref 3.5–5.2)
ALK PHOS: 66 U/L (ref 39–117)
ALT: 23 U/L (ref 0–35)
AST: 19 U/L (ref 0–37)
BUN: 16 mg/dL (ref 6–23)
CALCIUM: 9.4 mg/dL (ref 8.4–10.5)
CO2: 28 mEq/L (ref 19–32)
CREATININE: 0.92 mg/dL (ref 0.40–1.20)
Chloride: 106 mEq/L (ref 96–112)
GFR: 80.54 mL/min (ref >60.00)
Glucose, Bld: 102 mg/dL — ABNORMAL HIGH (ref 70–99)
Potassium: 4.3 mEq/L (ref 3.5–5.1)
SODIUM: 141 meq/L (ref 135–145)
TOTAL PROTEIN: 6.9 g/dL (ref 6.0–8.3)
Total Bilirubin: 0.4 mg/dL (ref 0.2–1.2)

## 2018-05-14 LAB — HEMOGLOBIN A1C: Hgb A1c MFr Bld: 6 % (ref 4.6–6.5)

## 2018-05-15 ENCOUNTER — Ambulatory Visit (INDEPENDENT_AMBULATORY_CARE_PROVIDER_SITE_OTHER): Payer: BC Managed Care – PPO | Admitting: Physician Assistant

## 2018-05-15 ENCOUNTER — Encounter (INDEPENDENT_AMBULATORY_CARE_PROVIDER_SITE_OTHER): Payer: Self-pay | Admitting: Physician Assistant

## 2018-05-15 ENCOUNTER — Encounter (INDEPENDENT_AMBULATORY_CARE_PROVIDER_SITE_OTHER): Payer: Self-pay

## 2018-05-25 ENCOUNTER — Other Ambulatory Visit: Payer: Self-pay | Admitting: Family Medicine

## 2018-05-25 DIAGNOSIS — I10 Essential (primary) hypertension: Secondary | ICD-10-CM

## 2018-05-26 ENCOUNTER — Ambulatory Visit: Payer: BC Managed Care – PPO | Admitting: Physician Assistant

## 2018-06-05 ENCOUNTER — Encounter: Payer: Self-pay | Admitting: Family Medicine

## 2018-06-05 ENCOUNTER — Ambulatory Visit: Payer: 59 | Admitting: Family Medicine

## 2018-06-05 VITALS — BP 126/70 | HR 66 | Temp 98.2°F | Resp 16 | Ht 65.0 in | Wt 232.4 lb

## 2018-06-05 DIAGNOSIS — R079 Chest pain, unspecified: Secondary | ICD-10-CM

## 2018-06-05 LAB — TROPONIN I

## 2018-06-05 LAB — D-DIMER, QUANTITATIVE (NOT AT ARMC): D DIMER QUANT: 0.64 ug{FEU}/mL — AB (ref <0.50)

## 2018-06-05 NOTE — Patient Instructions (Signed)
Chest Wall Pain °Chest wall pain is pain in or around the bones and muscles of your chest. Sometimes, an injury causes this pain. Sometimes, the cause may not be known. This pain may take several weeks or longer to get better. °Follow these instructions at home: °Pay attention to any changes in your symptoms. Take these actions to help with your pain: °· Rest as told by your health care provider. °· Avoid activities that cause pain. These include any activities that use your chest muscles or your abdominal and side muscles to lift heavy items. °· If directed, apply ice to the painful area: °? Put ice in a plastic bag. °? Place a towel between your skin and the bag. °? Leave the ice on for 20 minutes, 2-3 times per day. °· Take over-the-counter and prescription medicines only as told by your health care provider. °· Do not use tobacco products, including cigarettes, chewing tobacco, and e-cigarettes. If you need help quitting, ask your health care provider. °· Keep all follow-up visits as told by your health care provider. This is important. ° °Contact a health care provider if: °· You have a fever. °· Your chest pain becomes worse. °· You have new symptoms. °Get help right away if: °· You have nausea or vomiting. °· You feel sweaty or light-headed. °· You have a cough with phlegm (sputum) or you cough up blood. °· You develop shortness of breath. °This information is not intended to replace advice given to you by your health care provider. Make sure you discuss any questions you have with your health care provider. °Document Released: 08/06/2005 Document Revised: 12/15/2015 Document Reviewed: 11/01/2014 °Elsevier Interactive Patient Education © 2018 Elsevier Inc. ° °

## 2018-06-05 NOTE — Progress Notes (Signed)
Patient ID: Emily Osborne, female    DOB: [birth year]  Age: 57 y.o. MRN: [id number]    Subjective:  Subjective  HPI Emily Osborne presents for chest pain x several episodes that lasts a few miutes.  It radiates to left shoulder and jaw at times.  Several days since last episode.  No cp , sob or palp today.    Review of Systems  Constitutional: Negative for chills and fever.  HENT: Negative for congestion and hearing loss.   Eyes: Negative for discharge.  Respiratory: Negative for cough and shortness of breath.   Cardiovascular: Positive for chest pain. Negative for palpitations and leg swelling.  Gastrointestinal: Negative for abdominal pain, blood in stool, constipation, diarrhea, nausea and vomiting.  Genitourinary: Negative for dysuria, frequency, hematuria and urgency.  Musculoskeletal: Negative for back pain and myalgias.  Skin: Negative for rash.  Allergic/Immunologic: Negative for environmental allergies.  Neurological: Negative for dizziness, weakness and headaches.  Hematological: Does not bruise/bleed easily.  Psychiatric/Behavioral: Negative for suicidal ideas. The patient is not nervous/anxious.     History Past Medical History:  Diagnosis Date  . Allergy   . Anxiety   . Asthma    pt has inhaler  . Back pain   . C. difficile colitis   . Cataract    bil eyes  . Chest pain   . Chronic fatigue syndrome   . Clostridium difficile colitis 12/13/2016  . Diverticulitis   . Dyspnea   . Dysrhythmia    palpitations  . Edema   . Elevated blood pressure reading without diagnosis of hypertension    "just elevated when I'm in pain" (12/07/2015)  . Fatty liver   . Fibromyalgia   . Ganglion of joint    right wrist  . GERD (gastroesophageal reflux disease)   . H. pylori infection   . Headache    hx of  . Helicobacter pylori (H. pylori) infection 12/13/2016  . Herpes zoster without mention of complication   . HTN (hypertension)   . Hyperlipidemia   . Hyperthyroidism     . IBS (irritable bowel syndrome)   . Leg edema   . Multiple drug allergies 06/18/2017  . Myalgia   . Nontraumatic rupture of Achilles tendon   . Other acute reactions to stress   . Pain in joint, shoulder region   . Pain in joint, upper arm   . Pain in limb   . Palpitations   . Personal history of other diseases of digestive system   . Pneumonia    once  . PONV (postoperative nausea and vomiting)   . Routine screening for STI (sexually transmitted infection) 12/13/2016  . S/P partial colectomy 06/18/2017  . Thyroid disease    hyperthyroidism  . Vitamin B 12 deficiency     She has a past surgical history that includes Abdominal hysterectomy; Tubal ligation; Bunionectomy (Bilateral); Achilles tendon repair (Right, 2005); Ganglion cyst excision (Right); Meniscus repair (Right, 04/2014); wisdomteeth extraction; LEFT HEART CATH AND CORONARY ANGIOGRAPHY (N/A, 10/03/2016); and Colon surgery (01/23/2017).   Her family history includes Cancer in her brother; Coronary artery disease in her brother; Diabetes in her father; Heart disease in her father and mother; Hyperlipidemia in her father and mother; Hypertension in her brother, father, and mother; Kidney disease in her father; Leukemia in her brother; Lupus in her daughter; Obesity in her mother; Pernicious anemia in her sister; Thyroid disease in her mother and sister.She reports that she has never smoked. She has never used smokeless  tobacco. She reports that she does not drink alcohol or use drugs.  Current Outpatient Medications on File Prior to Visit  Medication Sig Dispense Refill  . acetaminophen (TYLENOL) 500 MG tablet Take 1,000 mg by mouth every 6 (six) hours as needed for moderate pain.     Marland Kitchen ADVAIR DISKUS 250-50 MCG/DOSE AEPB TAKE 1 PUFF BY MOUTH TWICE A DAY 60 each 2  . albuterol (PROAIR HFA) 108 (90 Base) MCG/ACT inhaler USE 2 PUFFS EVERY 4 HOURS AS NEEDED FOR COUGH, WHEEZE OR SHORTNESS OF BREATH. 8.5 each 2  . ALPRAZolam (XANAX)  0.25 MG tablet Take 1 tablet (0.25 mg total) by mouth 3 (three) times daily as needed. 30 tablet 2  . atenolol (TENORMIN) 25 MG tablet TAKE 1/2 TABLETS (12.5 MG TOTAL) BY MOUTH DAILY 45 tablet 3  . azelastine (OPTIVAR) 0.05 % ophthalmic solution Place 1 drop into both eyes 2 (two) times daily. 6 mL 12  . Blood Glucose Monitoring Suppl (FREESTYLE FREEDOM) KIT 1 Device by Does not apply route daily. 1 each 0  . DEXILANT 60 MG capsule TAKE ONE CAPSULE BY MOUTH DAILY 90 capsule 1  . fexofenadine (ALLEGRA) 180 MG tablet Take 1 tablet (180 mg total) by mouth daily. 90 tablet 3  . Flaxseed, Linseed, (FLAXSEED OIL PO) Take by mouth. One capsule daily    . fluorometholone (FML) 0.1 % ophthalmic suspension Place 1 drop into the right eye as needed.     . fluticasone (FLONASE) 50 MCG/ACT nasal spray PLACE 2 SPRAYS INTO BOTH NOSTRILS DAILY. 16 g 4  . FREESTYLE LITE test strip USE TO CHECK BLOOD SUGAR ONCE A DAY 100 each 0  . Iron Combinations (I.L.X. B-12) ELIX Take 1 each by mouth daily as needed (energy). 1 droplet full    . metFORMIN (GLUCOPHAGE) 500 MG tablet Take 1 tablet (500 mg total) by mouth daily with breakfast. 30 tablet 0  . Multiple Vitamins-Minerals (MULTIVITAMIN WITH MINERALS) tablet Take 1 tablet by mouth daily.    . valACYclovir (VALTREX) 1000 MG tablet Take 1 tablet (1,000 mg total) by mouth 3 (three) times daily. (Patient taking differently: Take 1,000 mg by mouth as needed (3 times daily as needed). As needed) 30 tablet 5  . Vitamin D, Ergocalciferol, (DRISDOL) 50000 units CAPS capsule Take 1 capsule (50,000 Units total) by mouth every 7 (seven) days. 4 capsule 0   No current facility-administered medications on file prior to visit.      Objective:  Objective  Physical Exam  Constitutional: She is oriented to person, place, and time. She appears well-developed and well-nourished.  HENT:  Head: Normocephalic and atraumatic.  Eyes: Conjunctivae and EOM are normal.  Neck: Normal range  of motion. Neck supple. No JVD present. Carotid bruit is not present. No thyromegaly present.  Cardiovascular: Normal rate, regular rhythm and normal heart sounds.  No murmur heard. Pulmonary/Chest: Effort normal and breath sounds normal. No respiratory distress. She has no wheezes. She has no rales. She exhibits no tenderness.  Musculoskeletal: She exhibits no edema.  Neurological: She is alert and oriented to person, place, and time.  Psychiatric: She has a normal mood and affect.  Nursing note and vitals reviewed.  BP 126/70 (BP Location: Right Arm, Cuff Size: Large)   Pulse 66   Temp 98.2 F (36.8 C) (Oral)   Resp 16   Ht 5' 5"  (1.651 m)   Wt 232 lb 6.4 oz (105.4 kg)   SpO2 98%   BMI 38.67 kg/m  Wt  Readings from Last 3 Encounters:  06/05/18 232 lb 6.4 oz (105.4 kg)  05/13/18 230 lb 6.4 oz (104.5 kg)  05/01/18 224 lb (101.6 kg)     Lab Results  Component Value Date   WBC 6.3 05/13/2018   HGB 13.5 05/13/2018   HCT 40.9 05/13/2018   PLT 244.0 05/13/2018   GLUCOSE 102 (H) 05/13/2018   CHOL 197 03/20/2018   TRIG 78 03/20/2018   HDL 57 03/20/2018   LDLDIRECT 139.4 12/26/2011   LDLCALC 124 (H) 03/20/2018   ALT 23 05/13/2018   AST 19 05/13/2018   NA 141 05/13/2018   K 4.3 05/13/2018   CL 106 05/13/2018   CREATININE 0.92 05/13/2018   BUN 16 05/13/2018   CO2 28 05/13/2018   TSH 0.74 05/13/2018   INR 0.93 10/03/2016   HGBA1C 6.0 05/13/2018    Mm 3d Screen Breast Bilateral  Result Date: 04/23/2018 CLINICAL DATA:  Screening. EXAM: DIGITAL SCREENING BILATERAL MAMMOGRAM WITH TOMO AND CAD COMPARISON:  Previous exam(s). ACR Breast Density Category b: There are scattered areas of fibroglandular density. FINDINGS: There are no findings suspicious for malignancy. Images were processed with CAD. IMPRESSION: No mammographic evidence of malignancy. A result letter of this screening mammogram will be mailed directly to the patient. RECOMMENDATION: Screening mammogram in one year.  (Code:SM-B-01Y) BI-RADS CATEGORY  1: Negative. Electronically Signed   By: Kristopher Oppenheim M.D.   On: 04/23/2018 12:50     Assessment & Plan:  Plan  I am having Emily Christians "[name]" maintain her acetaminophen, FREESTYLE FREEDOM, I.L.X. B-12, FREESTYLE LITE, albuterol, valACYclovir, (Flaxseed, Linseed, (FLAXSEED OIL PO)), fluticasone, ALPRAZolam, fexofenadine, ADVAIR DISKUS, multivitamin with minerals, azelastine, DEXILANT, fluorometholone, metFORMIN, Vitamin D (Ergocalciferol), and atenolol.  No orders of the defined types were placed in this encounter.   Problem List Items Addressed This Visit    None    Visit Diagnoses    Chest pain, unspecified type    -  Primary   Relevant Orders   EKG 12-Lead (Completed)   Ambulatory referral to Cardiology   CBC with Differential/Platelet   Comprehensive metabolic panel   TSH   D-Dimer, Quantitative (Completed)   Troponin I    ekg --nsr-- no change from previous con't dexilant and atenolol If pain returns--- Go to ER   Follow-up: Return if symptoms worsen or fail to improve.  [name], DO

## 2018-06-06 ENCOUNTER — Ambulatory Visit (INDEPENDENT_AMBULATORY_CARE_PROVIDER_SITE_OTHER): Payer: 59

## 2018-06-06 ENCOUNTER — Encounter (INDEPENDENT_AMBULATORY_CARE_PROVIDER_SITE_OTHER): Payer: Self-pay | Admitting: Orthopedic Surgery

## 2018-06-06 ENCOUNTER — Ambulatory Visit (HOSPITAL_BASED_OUTPATIENT_CLINIC_OR_DEPARTMENT_OTHER)
Admission: RE | Admit: 2018-06-06 | Discharge: 2018-06-06 | Disposition: A | Discharge status: Home / Self Care | Payer: 59 | Source: Ambulatory Visit | Attending: Family Medicine | Admitting: Family Medicine

## 2018-06-06 ENCOUNTER — Encounter: Payer: Self-pay | Admitting: *Deleted

## 2018-06-06 ENCOUNTER — Other Ambulatory Visit: Payer: Self-pay | Admitting: Family Medicine

## 2018-06-06 ENCOUNTER — Encounter (HOSPITAL_BASED_OUTPATIENT_CLINIC_OR_DEPARTMENT_OTHER): Payer: Self-pay

## 2018-06-06 ENCOUNTER — Ambulatory Visit (INDEPENDENT_AMBULATORY_CARE_PROVIDER_SITE_OTHER): Payer: 59 | Admitting: Orthopedic Surgery

## 2018-06-06 DIAGNOSIS — R079 Chest pain, unspecified: Secondary | ICD-10-CM | POA: Insufficient documentation

## 2018-06-06 DIAGNOSIS — M1712 Unilateral primary osteoarthritis, left knee: Secondary | ICD-10-CM | POA: Diagnosis not present

## 2018-06-06 DIAGNOSIS — R7989 Other specified abnormal findings of blood chemistry: Secondary | ICD-10-CM | POA: Diagnosis not present

## 2018-06-06 DIAGNOSIS — M25562 Pain in left knee: Secondary | ICD-10-CM

## 2018-06-06 DIAGNOSIS — R0602 Shortness of breath: Secondary | ICD-10-CM | POA: Diagnosis not present

## 2018-06-06 LAB — CBC WITH DIFFERENTIAL/PLATELET
BASOS PCT: 0.8 % (ref 0.0–3.0)
Basophils Absolute: 0 10*3/uL (ref 0.0–0.1)
EOS PCT: 3 % (ref 0.0–5.0)
Eosinophils Absolute: 0.2 10*3/uL (ref 0.0–0.7)
HEMATOCRIT: 40.3 % (ref 36.0–46.0)
Hemoglobin: 13.1 g/dL (ref 12.0–15.0)
Lymphocytes Relative: 50.7 % — ABNORMAL HIGH (ref 12.0–46.0)
Lymphs Abs: 3 10*3/uL (ref 0.7–4.0)
MCHC: 32.6 g/dL (ref 30.0–36.0)
MCV: 88.4 fl (ref 78.0–100.0)
MONOS PCT: 10.3 % (ref 3.0–12.0)
Monocytes Absolute: 0.6 10*3/uL (ref 0.1–1.0)
Neutro Abs: 2.1 10*3/uL (ref 1.4–7.7)
Neutrophils Relative %: 35.2 % — ABNORMAL LOW (ref 43.0–77.0)
Platelets: 258 10*3/uL (ref 150.0–400.0)
RBC: 4.55 Mil/uL (ref 3.87–5.11)
RDW: 14.8 % (ref 11.5–15.5)
WBC: 5.9 10*3/uL (ref 4.0–10.5)

## 2018-06-06 LAB — COMPREHENSIVE METABOLIC PANEL
ALBUMIN: 4.1 g/dL (ref 3.5–5.2)
ALT: 19 U/L (ref 0–35)
AST: 17 U/L (ref 0–37)
Alkaline Phosphatase: 59 U/L (ref 39–117)
BUN: 16 mg/dL (ref 6–23)
CALCIUM: 9.8 mg/dL (ref 8.4–10.5)
CHLORIDE: 105 meq/L (ref 96–112)
CO2: 29 mEq/L (ref 19–32)
CREATININE: 1.01 mg/dL (ref 0.40–1.20)
GFR: 72.3 mL/min (ref >60.00)
Glucose, Bld: 95 mg/dL (ref 70–99)
Potassium: 4.4 mEq/L (ref 3.5–5.1)
Sodium: 145 mEq/L (ref 135–145)
Total Bilirubin: 0.3 mg/dL (ref 0.2–1.2)
Total Protein: 6.7 g/dL (ref 6.0–8.3)

## 2018-06-06 LAB — TSH: TSH: 0.67 u[IU]/mL (ref 0.35–4.50)

## 2018-06-06 MED ORDER — BUPIVACAINE HCL 0.25 % IJ SOLN
4.0000 mL | INTRAMUSCULAR | Status: AC | PRN
  Administered 2018-06-06: 4 mL via INTRA_ARTICULAR

## 2018-06-06 MED ORDER — IOPAMIDOL (ISOVUE-370) INJECTION 76%
100.0000 mL | Freq: Once | INTRAVENOUS | Status: AC | PRN
  Administered 2018-06-06: 72 mL via INTRAVENOUS

## 2018-06-06 MED ORDER — METHYLPREDNISOLONE ACETATE 40 MG/ML IJ SUSP
40.0000 mg | INTRAMUSCULAR | Status: AC | PRN
  Administered 2018-06-06: 40 mg via INTRA_ARTICULAR

## 2018-06-06 MED ORDER — LIDOCAINE HCL 1 % IJ SOLN
5.0000 mL | INTRAMUSCULAR | Status: AC | PRN
  Administered 2018-06-06: 5 mL

## 2018-06-06 NOTE — Telephone Encounter (Signed)
Pt. Completed CT chest, normal limits. Author reviewed with Dr. Etter Sjogren; pt. Came up to office from imaging and results relayed to pt. and family in person. Pt. has work note. Cardiology referral in place; pt. knows to call our office if she does not hear from cardiology by early next week.

## 2018-06-06 NOTE — Telephone Encounter (Signed)
Patient daughter may pickup note

## 2018-06-06 NOTE — Progress Notes (Signed)
Office Visit Note   Patient: Emily Osborne           Date of Birth: 1961-04-08           MRN: [id number] Visit Date: 06/06/2018 Requested by: [address] [place], [place] [address] [place], [place] [address] PCP: Carollee Herter, Alferd Apa, DO  Subjective: Chief Complaint  Patient presents with  . 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  She reports acute pain of the left knee for several days.  Denies any mechanical symptoms.  She would like to have an injection if possible.  She is had left knee arthroscopy several years ago.  Reports now primarily medial pain.  Takes occasional Tylenol as well as Voltaren gel.  She has a daughter who is now at home and she is had to deal with her a lot of ways.  This makes her have to work shifts on the weekends.  This weekend shifts are 12 hours which are difficult for her.              ROS: All systems reviewed are negative as they relate to the chief complaint within the history of present illness.  Patient denies  fevers or chills.   Assessment & Plan: Visit Diagnoses:  1. Acute pain of left knee   2. Unilateral primary osteoarthritis, left knee     Plan: Impression is left knee pain with no effusion and radiographs which look pretty reasonable compared to a year ago.  Plan is injection into the left knee with knee sleeve.  We will see if that helps her.  Follow-up as needed continue with leg strengthening exercises and weight loss if possible.  Follow-Up Instructions: Return if symptoms worsen or fail to improve.   Orders:  Orders Placed This Encounter  Procedures  . XR KNEE 3 VIEW LEFT   No orders of the defined types were placed in this encounter.     Procedures: Large Joint Inj: L knee on 06/06/2018 4:54 PM Indications: diagnostic evaluation, joint swelling and pain Details: 18 G 1.5 in needle, superolateral approach  Arthrogram: No  Medications: 5 mL lidocaine 1 %; 40 mg  methylPREDNISolone acetate 40 MG/ML; 4 mL bupivacaine 0.25 % Outcome: tolerated well, no immediate complications Procedure, treatment alternatives, risks and benefits explained, specific risks discussed. Consent was given by the patient. Immediately prior to procedure a time out was called to verify the correct patient, procedure, equipment, support staff and site/side marked as required. Patient was prepped and draped in the usual sterile fashion.       Clinical Data: No additional findings.  Objective: Vital Signs: There were no vitals taken for this visit.  Physical Exam:   Constitutional: Patient appears well-developed HEENT:  Head: Normocephalic Eyes:EOM are normal Neck: Normal range of motion Cardiovascular: Normal rate Pulmonary/chest: Effort normal Neurologic: Patient is alert Skin: Skin is warm Psychiatric: Patient has normal mood and affect    Ortho Exam: Ortho exam demonstrates palpable pedal pulses.  Knee range of motion is full.  No effusion in the left knee.  Collateral crucial ligaments are stable.  There is mild medial joint line tenderness but negative McMurray compression testing.  Mild patellofemoral crepitus bilaterally.  Gait is normal.  No groin pain with internal/external rotation of the leg.  Specialty Comments:  No specialty comments available.  Imaging: Ct Angio Chest W/cm &/or Wo Cm  Result  Date: 06/06/2018 CLINICAL DATA:  Shortness of breath.  Left anterior chest pain. EXAM: CT ANGIOGRAPHY CHEST WITH CONTRAST TECHNIQUE: Multidetector CT imaging of the chest was performed using the standard protocol during bolus administration of intravenous contrast. Multiplanar CT image reconstructions and MIPs were obtained to evaluate the vascular anatomy. CONTRAST:  58mL ISOVUE-370 IOPAMIDOL (ISOVUE-370) INJECTION 76% COMPARISON:  Radiography 08/22/2017 FINDINGS: Cardiovascular: Pulmonary arterial opacification is good. There are no pulmonary emboli. No aortic  atherosclerosis is evident. No evidence of aortic dissection. No coronary artery calcification is seen. Heart size is normal. Mediastinum/Nodes: Normal Lungs/Pleura: The lungs are clear.  No pleural fluid. Upper Abdomen: Normal Musculoskeletal: Ordinary mild thoracic degenerative changes, primarily of the facet joints. No rib abnormality seen. Review of the MIP images confirms the above findings. IMPRESSION: Normal CT scan of the chest for age. No pulmonary emboli. No other acute or significant chest pathology. Electronically Signed   By: Nelson Chimes M.D.   On: 06/06/2018 16:01   Xr Knee 3 View Left  Result Date: 06/06/2018 AP lateral merchant left knee reviewed.  Joint spaces are maintained in the medial lateral compartment.  Slightly more spurring is noted in the lateral compartment.  Alignment intact.  No osteo-pia noted.  No fracture dislocation present    PMFS History: Patient Active Problem List   Diagnosis Date Noted  . HTN (hypertension) 11/21/2017  . Anxiety 10/11/2017  . Hyperlipidemia LDL goal <100 10/11/2017  . S/P partial colectomy 06/18/2017  . Multiple drug allergies 06/18/2017  . Colitis 02/05/2017  . Diarrhea 02/05/2017  . Diverticular disease 01/23/2017  . Helicobacter pylori (H. pylori) infection 12/13/2016  . Routine screening for STI (sexually transmitted infection) 12/13/2016  . Clostridium difficile colitis 12/13/2016  . Diverticulitis 10/10/2016  . Lower abdominal pain 09/29/2016  . Palpitations 02/06/2016  . Mild diastolic dysfunction 41/93/7902  . Graves disease 01/02/2016  . Acute diverticulitis 12/07/2015  . Acute bacterial sinusitis 10/04/2015  . History of colonic polyps 11/15/2014  . Left shoulder pain 09/28/2013  . Left-sided weakness 09/16/2013  . Obesity (BMI 30-39.9) 09/02/2013  . Myalgia 06/21/2011  . POSTMENOPAUSAL STATUS 09/07/2010  . PAIN IN JOINT, MULTIPLE SITES 12/27/2009  . LOW BACK PAIN, CHRONIC 12/27/2009  . FATIGUE 12/27/2009  .  VAGINITIS 11/22/2009  . DYSPEPSIA&OTHER [facility] DISORDERS FUNCTION STOMACH 09/01/2009  . NAUSEA 08/03/2009  . FLATULENCE-GAS-BLOATING 08/03/2009  . ABDOMINAL PAIN RIGHT UPPER QUADRANT 07/06/2009  . SUPRAPUBIC PAIN 07/06/2009  . GANGLION CYST, HX OF 07/06/2009  . BACK PAIN 05/04/2009  . Hyperlipidemia 09/16/2008  . UNSPECIFIED MYALGIA AND MYOSITIS 09/16/2008  . Precordial pain 07/27/2008  . GERD 06/08/2008  . HYPERGLYCEMIA, FASTING 06/08/2008  . HELICOBACTER PYLORI GASTRITIS, HX OF 04/05/2008  . OTHER ACUTE REACTIONS TO STRESS 03/16/2008  . ABDOMINAL PAIN, EPIGASTRIC 03/16/2008  . SHOULDER PAIN, RIGHT 10/01/2007  . ELBOW PAIN, RIGHT 10/01/2007  . ELEVATED BLOOD PRESSURE 04/29/2007  . Pain in limb 04/14/2007  . DEPENDENT EDEMA, RIGHT LEG 04/14/2007  . SHINGLES 04/08/2007  . Asthma 03/18/2007  . GANGLION CYST, WRIST, RIGHT 03/18/2007  . BUNIONECTOMY, HX OF 03/18/2007   Past Medical History:  Diagnosis Date  . Allergy   . Anxiety   . Asthma    pt has inhaler  . Back pain   . C. difficile colitis   . Cataract    bil eyes  . Chest pain   . Chronic fatigue syndrome   . Clostridium difficile colitis 12/13/2016  . Diverticulitis   . Dyspnea   . Dysrhythmia  palpitations  . Edema   . Elevated blood pressure reading without diagnosis of hypertension    "just elevated when I'm in pain" (12/07/2015)  . Fatty liver   . Fibromyalgia   . Ganglion of joint    right wrist  . GERD (gastroesophageal reflux disease)   . H. pylori infection   . Headache    hx of  . Helicobacter pylori (H. pylori) infection 12/13/2016  . Herpes zoster without mention of complication   . HTN (hypertension)   . Hyperlipidemia   . Hyperthyroidism   . IBS (irritable bowel syndrome)   . Leg edema   . Multiple drug allergies 06/18/2017  . Myalgia   . Nontraumatic rupture of Achilles tendon   . Other acute reactions to stress   . Pain in joint, shoulder region   . Pain in joint, upper arm   . Pain in  limb   . Palpitations   . Personal history of other diseases of digestive system   . Pneumonia    once  . PONV (postoperative nausea and vomiting)   . Routine screening for STI (sexually transmitted infection) 12/13/2016  . S/P partial colectomy 06/18/2017  . Thyroid disease    hyperthyroidism  . Vitamin B 12 deficiency     Family History  Problem Relation Age of Onset  . Hypertension Father   . Diabetes Father   . Heart disease Father   . Kidney disease Father        Died, 44  . Hyperlipidemia Father   . Leukemia Brother   . Cancer Brother        myleoblastic anemia  . Hyperlipidemia Mother   . Hypertension Mother        Died, 2  . Thyroid disease Mother        Thyroid surgery  . Obesity Mother   . Heart disease Mother   . Pernicious anemia Sister   . Thyroid disease Sister        On thyroid Rx  . Lupus Daughter   . Coronary artery disease Brother   . Hypertension Brother   . Colon cancer Neg Hx   . Esophageal cancer Neg Hx   . Rectal cancer Neg Hx   . Stomach cancer Neg Hx   . Pancreatic cancer Neg Hx   . Prostate cancer Neg Hx     Past Surgical History:  Procedure Laterality Date  . ABDOMINAL HYSTERECTOMY    . ACHILLES TENDON REPAIR Right 2005  . BUNIONECTOMY Bilateral    Bunionectomy 1983  . COLON SURGERY  01/23/2017   6 to 8 inches sigmoid colon removed  . GANGLION CYST EXCISION Right    wrist  . LEFT HEART CATH AND CORONARY ANGIOGRAPHY N/A 10/03/2016   Procedure: Left Heart Cath and Coronary Angiography;  Surgeon: Burnell Blanks, MD;  Location: [facility];  Service: Cardiovascular;  Laterality: N/A;  . MENISCUS REPAIR Right 04/2014  . TUBAL LIGATION    . wisdomteeth extraction     Social History   Occupational History  . Occupation: Nurse/teacher  Tobacco Use  . Smoking status: Never Smoker  . Smokeless tobacco: Never Used  Substance and Sexual Activity  . Alcohol use: No    Alcohol/week: 0.0 standard drinks  . Drug use: No  .  Sexual activity: Not Currently    Partners: Male

## 2018-06-06 NOTE — Telephone Encounter (Signed)
Patient has appt set up for 330pm today.  Note at front desk for pickup.

## 2018-06-06 NOTE — Telephone Encounter (Signed)
Keep trying please-- we need to get ct today

## 2018-06-06 NOTE — Telephone Encounter (Signed)
Pt. returned call. Author relayed d-dimer result and instruction to get CT stat per Dr. Etter Sjogren. Pt. stated she would call imaging to make an appointment for today if possible. Pt. asked if she should still go into work, and Chief Strategy Officer advised to go get imaging done first.  Pt. states she will need a work note at least for today.  Routed to [facility] to create and Dr. Etter Sjogren to approve.

## 2018-06-09 ENCOUNTER — Telehealth: Payer: Self-pay | Admitting: *Deleted

## 2018-06-09 NOTE — Telephone Encounter (Signed)
Received Abnormal Lab Report results from [facility]; forwarded to provider, she is aware/SLS 10/21

## 2018-06-11 ENCOUNTER — Ambulatory Visit (INDEPENDENT_AMBULATORY_CARE_PROVIDER_SITE_OTHER): Payer: BC Managed Care – PPO | Admitting: Orthopedic Surgery

## 2018-06-11 ENCOUNTER — Encounter: Payer: Self-pay | Admitting: Family Medicine

## 2018-06-16 ENCOUNTER — Other Ambulatory Visit (INDEPENDENT_AMBULATORY_CARE_PROVIDER_SITE_OTHER): Payer: Self-pay

## 2018-06-16 ENCOUNTER — Encounter (INDEPENDENT_AMBULATORY_CARE_PROVIDER_SITE_OTHER): Payer: Self-pay | Admitting: Family Medicine

## 2018-06-16 DIAGNOSIS — E559 Vitamin D deficiency, unspecified: Secondary | ICD-10-CM

## 2018-06-16 DIAGNOSIS — R7303 Prediabetes: Secondary | ICD-10-CM

## 2018-06-16 MED ORDER — METFORMIN HCL 500 MG PO TABS: 500.0000 mg | ORAL_TABLET | Freq: Every day | ORAL | 0 refills | Status: DC

## 2018-06-16 MED ORDER — VITAMIN D (ERGOCALCIFEROL) 1.25 MG (50000 UNIT) PO CAPS: 50000.0000 [IU] | ORAL_CAPSULE | ORAL | 0 refills | Status: DC

## 2018-06-17 MED FILL — metFORMIN HCL 500 MG TABS: 500 | 14 days supply | Qty: 14 | Fill #0

## 2018-06-17 MED FILL — VIT D2 1.25 MG (50,000 UNIT: 1.25 MG | 14 days supply | Qty: 2 | Fill #0

## 2018-06-21 ENCOUNTER — Encounter (INDEPENDENT_AMBULATORY_CARE_PROVIDER_SITE_OTHER): Payer: Self-pay | Admitting: Family Medicine

## 2018-06-25 ENCOUNTER — Ambulatory Visit: Payer: 59 | Admitting: Gastroenterology

## 2018-06-25 ENCOUNTER — Encounter: Payer: Self-pay | Admitting: Gastroenterology

## 2018-06-25 VITALS — BP 130/80 | HR 72 | Ht 65.0 in | Wt 227.8 lb

## 2018-06-25 DIAGNOSIS — Z86010 Personal history of colonic polyps: Secondary | ICD-10-CM | POA: Diagnosis not present

## 2018-06-25 DIAGNOSIS — R079 Chest pain, unspecified: Secondary | ICD-10-CM

## 2018-06-25 DIAGNOSIS — A09 Infectious gastroenteritis and colitis, unspecified: Secondary | ICD-10-CM | POA: Diagnosis not present

## 2018-06-25 DIAGNOSIS — K219 Gastro-esophageal reflux disease without esophagitis: Secondary | ICD-10-CM

## 2018-06-25 DIAGNOSIS — A048 Other specified bacterial intestinal infections: Secondary | ICD-10-CM | POA: Diagnosis not present

## 2018-06-25 DIAGNOSIS — H5213 Myopia, bilateral: Secondary | ICD-10-CM | POA: Diagnosis not present

## 2018-06-25 MED ORDER — DEXLANSOPRAZOLE 60 MG PO CPDR: 1.0000 | DELAYED_RELEASE_CAPSULE | Freq: Every day | ORAL | 3 refills | Status: DC

## 2018-06-25 NOTE — Progress Notes (Signed)
HPI :  57 year old female here for a follow-up visit. Please see prior notes for details of her case, she has, history of refractory H. Pylori, history of diverticulitis status post surgical resection, history of C. Difficile, history of GERD.  She reported at the end of October she developed acute onset nausea or vomiting, diarrhea and bloating. She was concerned that her C. Difficile had recurred. Her daughter was also ill with a gastroenteritis. She reports her diarrhea persists up results of about a week and then resolved. As did her nausea and vomiting. She's been feeling better since that time although had one episode of severe discomfort in her abdomen bloating and distention after eating a piece of pizza. This resolved after 3 or 4 hours. Since that time she is doing well  She was seen by infectious disease since I've seen her. Dr. Tommy Medal placed her on nitazoxanide + doxycycline +levaquin + PPI which was her FOURTH regimen for H pylori treatment. She completed this. He recommended follow up stool testing for eradication which she has not yet done. Both her daughter and boyfriend have been treated for H pylori given this occurrence.   She otherwise endorses intermittent chest pain in her left chest which occur sporadically. She denies any exertional symptoms, she reports her heartburn and reflux symptoms seem well controlled. She was in the ED for this issue recently and had a CT scan negative for PE. She is awaiting a visit with the cardiologist which is scheduled for next week.  Endoscopic history: EGD 04/2008 - normal esophagus, H pylori gastriits, normal duodenum Colonoscopy in 2012 with a 78m descending colon polyp removed but not retrieved. Colonoscopy 09/30/2015 - seven small colon polyps - adenomas, sessile serrated, recall in 3 years, pancolonic diverticulosis, and hemorrhoids EGD 05/17/16 - normal esophagus, benign gastric polyps, gastritis, normal duodenum. Biopsies show no celiac,  gastric polyps benign, H pylori gastritis noted EGD 06/04/2017 - gastritis, biposies taken, prominent ampulla - 2cm HH, otherwise normal esophagus - H pylori attempted to be cultured and could not. She followed up with ID    Past Medical History:  Diagnosis Date  . Allergy   . Anxiety   . Asthma    pt has inhaler  . Back pain   . C. difficile colitis   . Cataract    bil eyes  . Chest pain   . Chronic fatigue syndrome   . Clostridium difficile colitis 12/13/2016  . Diverticulitis   . Dyspnea   . Dysrhythmia    palpitations  . Edema   . Elevated blood pressure reading without diagnosis of hypertension    "just elevated when I'm in pain" (12/07/2015)  . Fatty liver   . Fibromyalgia   . Ganglion of joint    right wrist  . GERD (gastroesophageal reflux disease)   . H. pylori infection   . Headache    hx of  . Helicobacter pylori (H. pylori) infection 12/13/2016  . Herpes zoster without mention of complication   . HTN (hypertension)   . Hyperlipidemia   . Hyperthyroidism   . IBS (irritable bowel syndrome)   . Leg edema   . Multiple drug allergies 06/18/2017  . Myalgia   . Nontraumatic rupture of Achilles tendon   . Other acute reactions to stress   . Pain in joint, shoulder region   . Pain in joint, upper arm   . Pain in limb   . Palpitations   . Personal history of other diseases of  digestive system   . Pneumonia    once  . PONV (postoperative nausea and vomiting)   . Routine screening for STI (sexually transmitted infection) 12/13/2016  . S/P partial colectomy 06/18/2017  . Thyroid disease    hyperthyroidism  . Vitamin B 12 deficiency      Past Surgical History:  Procedure Laterality Date  . ABDOMINAL HYSTERECTOMY    . ACHILLES TENDON REPAIR Right 2005  . BUNIONECTOMY Bilateral    Bunionectomy 1983  . COLON SURGERY  01/23/2017   6 to 8 inches sigmoid colon removed  . GANGLION CYST EXCISION Right    wrist  . LEFT HEART CATH AND CORONARY ANGIOGRAPHY N/A  10/03/2016   Procedure: Left Heart Cath and Coronary Angiography;  Surgeon: Burnell Blanks, MD;  Location: [facility];  Service: Cardiovascular;  Laterality: N/A;  . MENISCUS REPAIR Right 04/2014  . TUBAL LIGATION    . wisdomteeth extraction     Family History  Problem Relation Age of Onset  . Hypertension Father   . Diabetes Father   . Heart disease Father   . Kidney disease Father        Died, 43  . Hyperlipidemia Father   . Leukemia Brother   . Cancer Brother        myleoblastic anemia  . Hyperlipidemia Mother   . Hypertension Mother        Died, 46  . Thyroid disease Mother        Thyroid surgery  . Obesity Mother   . Heart disease Mother   . Pernicious anemia Sister   . Thyroid disease Sister        On thyroid Rx  . Lupus Daughter   . Coronary artery disease Brother   . Hypertension Brother   . Colon cancer Neg Hx   . Esophageal cancer Neg Hx   . Rectal cancer Neg Hx   . Stomach cancer Neg Hx   . Pancreatic cancer Neg Hx   . Prostate cancer Neg Hx    Social History   Tobacco Use  . Smoking status: Never Smoker  . Smokeless tobacco: Never Used  Substance Use Topics  . Alcohol use: No    Alcohol/week: 0.0 standard drinks  . Drug use: No   Current Outpatient Medications  Medication Sig Dispense Refill  . acetaminophen (TYLENOL) 500 MG tablet Take 1,000 mg by mouth every 6 (six) hours as needed for moderate pain.     Marland Kitchen ADVAIR DISKUS 250-50 MCG/DOSE AEPB TAKE 1 PUFF BY MOUTH TWICE A DAY 60 each 2  . albuterol (PROAIR HFA) 108 (90 Base) MCG/ACT inhaler USE 2 PUFFS EVERY 4 HOURS AS NEEDED FOR COUGH, WHEEZE OR SHORTNESS OF BREATH. 8.5 each 2  . ALPRAZolam (XANAX) 0.25 MG tablet Take 1 tablet (0.25 mg total) by mouth 3 (three) times daily as needed. 30 tablet 2  . atenolol (TENORMIN) 25 MG tablet TAKE 1/2 TABLETS (12.5 MG TOTAL) BY MOUTH DAILY 45 tablet 3  . azelastine (OPTIVAR) 0.05 % ophthalmic solution Place 1 drop into both eyes 2 (two) times daily.  6 mL 12  . Blood Glucose Monitoring Suppl (FREESTYLE FREEDOM) KIT 1 Device by Does not apply route daily. 1 each 0  . DEXILANT 60 MG capsule TAKE ONE CAPSULE BY MOUTH DAILY 90 capsule 1  . fexofenadine (ALLEGRA) 180 MG tablet Take 1 tablet (180 mg total) by mouth daily. 90 tablet 3  . Flaxseed, Linseed, (FLAXSEED OIL PO) Take by mouth. One  capsule daily    . fluticasone (FLONASE) 50 MCG/ACT nasal spray PLACE 2 SPRAYS INTO BOTH NOSTRILS DAILY. 16 g 4  . FREESTYLE LITE test strip USE TO CHECK BLOOD SUGAR ONCE A DAY 100 each 0  . Iron Combinations (I.L.X. B-12) ELIX Take 1 each by mouth daily as needed (energy). 1 droplet full    . metFORMIN (GLUCOPHAGE) 500 MG tablet Take 1 tablet (500 mg total) by mouth daily with breakfast. 14 tablet 0  . Multiple Vitamins-Minerals (MULTIVITAMIN WITH MINERALS) tablet Take 1 tablet by mouth daily.    . valACYclovir (VALTREX) 1000 MG tablet Take 1 tablet (1,000 mg total) by mouth 3 (three) times daily. (Patient taking differently: Take 1,000 mg by mouth as needed (3 times daily as needed). As needed) 30 tablet 5  . Vitamin D, Ergocalciferol, (DRISDOL) 50000 units CAPS capsule Take 1 capsule (50,000 Units total) by mouth every 7 (seven) days. 2 capsule 0   No current facility-administered medications for this visit.    Allergies  Allergen Reactions  . Aspirin Other (See Comments)    REACTION: anaphylaxis  . Gentamicin Other (See Comments)    Eye drops turned the sclera bright red  . Ibuprofen Other (See Comments)    REACTION: anaphylaxsis  . Metronidazole Swelling    REACTION: red face/swelling  . Nsaids Other (See Comments)    REACTION: anaphylaxis  . Doxycycline Other (See Comments)    REACTION: severe nausea/vomiting  . Influenza A (H1n1) Monoval Vac Other (See Comments)    REACTION: sick for 3 weeks  . Loratadine Other (See Comments)    Fatigue/weakness  . Periactin [Cyproheptadine] Other (See Comments)    paranoid   . Ranitidine Hcl Other (See  Comments)    REACTION: Lips turned red /peel  . Sulfamethoxazole-Trimethoprim Other (See Comments)    REACTION: face red/peel  . Tramadol Hcl Other (See Comments)    REACTION: paranoid     Review of Systems: All systems reviewed and negative except where noted in HPI.    Ct Angio Chest W/cm &/or Wo Cm  Result Date: 06/06/2018 CLINICAL DATA:  Shortness of breath.  Left anterior chest pain. EXAM: CT ANGIOGRAPHY CHEST WITH CONTRAST TECHNIQUE: Multidetector CT imaging of the chest was performed using the standard protocol during bolus administration of intravenous contrast. Multiplanar CT image reconstructions and MIPs were obtained to evaluate the vascular anatomy. CONTRAST:  1m ISOVUE-370 IOPAMIDOL (ISOVUE-370) INJECTION 76% COMPARISON:  Radiography 08/22/2017 FINDINGS: Cardiovascular: Pulmonary arterial opacification is good. There are no pulmonary emboli. No aortic atherosclerosis is evident. No evidence of aortic dissection. No coronary artery calcification is seen. Heart size is normal. Mediastinum/Nodes: Normal Lungs/Pleura: The lungs are clear.  No pleural fluid. Upper Abdomen: Normal Musculoskeletal: Ordinary mild thoracic degenerative changes, primarily of the facet joints. No rib abnormality seen. Review of the MIP images confirms the above findings. IMPRESSION: Normal CT scan of the chest for age. No pulmonary emboli. No other acute or significant chest pathology. Electronically Signed   By: MNelson ChimesM.D.   On: 06/06/2018 16:01   Xr Knee 3 View Left  Result Date: 06/06/2018 AP lateral merchant left knee reviewed.  Joint spaces are maintained in the medial lateral compartment.  Slightly more spurring is noted in the lateral compartment.  Alignment intact.  No osteo-pia noted.  No fracture dislocation present   Physical Exam: BP 130/80   Pulse 72   Ht 5' 5" (1.651 m)   Wt 227 lb 12.8 oz (103.3 kg)  BMI 37.91 kg/m  Constitutional: Pleasant,well-developed, female in no  acute distress. HEENT: Normocephalic and atraumatic. Conjunctivae are normal. No scleral icterus. Neck supple.  Cardiovascular: Normal rate, regular rhythm.  Pulmonary/chest: Effort normal and breath sounds normal. No wheezing, rales or rhonchi. Abdominal: Soft, nondistended, nontender. . There are no masses palpable. No hepatomegaly. Extremities: no edema Lymphadenopathy: No cervical adenopathy noted. Neurological: Alert and oriented to person place and time. Skin: Skin is warm and dry. No rashes noted. Psychiatric: Normal mood and affect. Behavior is normal.   ASSESSMENT AND PLAN: 57 year old female here for assessment of the following issues:  Suspected gastroenteritis - acute onset nausea vomiting diarrhea, resolved on its own without intervention, multiple family members were ill at the same time. I counseled her this seems unlikely be related to C. Difficile given she is felt better without intervention. She'll monitor symptoms and let me know they recur.  H. Pylori infection - refractory to 3 separate regimens, referred 3 times a day and treated again by Dr. Tommy Medal. She has not yet had eradication testing. Recommend she hold her Dexilant for 2 weeks and then submitted a stool sample for H. Pylori stool antigen to confirm eradication. She agreed  GERD - has been well controlled on Dexilant recently, she reports as a daily PPI which has worked for her, she is agreeable to hold this for 2 weeks however for H. Pylori testing.  Chest pain - negative CT for PE, referred to cardiology which I agree with, this consultation is pending, reflux seems well controlled  History of colon polyps - due for surveillance colonoscopy in February 2020, she will call to schedule one month in advance.  Skyland Cellar, MD [facility]

## 2018-06-25 NOTE — Patient Instructions (Addendum)
We have sent the following medications to your pharmacy for you to pick up at your convenience: [place].   Your provider has requested that you go to the basement level for lab work before leaving today. Press "B" on the elevator. The lab is located at the first door on the left as you exit the elevator. Please hold your Dexilant x 2 weeks prior to doing H. Pylori stool antigen.  You will be due for a recall colonoscopy in 09/2018. We will send you a reminder in the mail when it gets closer to that time.

## 2018-07-03 ENCOUNTER — Ambulatory Visit (INDEPENDENT_AMBULATORY_CARE_PROVIDER_SITE_OTHER): Payer: 59 | Admitting: Physician Assistant

## 2018-07-03 ENCOUNTER — Ambulatory Visit: Payer: 59 | Admitting: Cardiology

## 2018-07-03 ENCOUNTER — Encounter (INDEPENDENT_AMBULATORY_CARE_PROVIDER_SITE_OTHER): Payer: Self-pay | Admitting: Physician Assistant

## 2018-07-03 VITALS — BP 107/73 | HR 64 | Temp 98.2°F | Ht 65.0 in | Wt 222.0 lb

## 2018-07-03 DIAGNOSIS — Z9189 Other specified personal risk factors, not elsewhere classified: Secondary | ICD-10-CM

## 2018-07-03 DIAGNOSIS — Z6837 Body mass index (BMI) 37.0-37.9, adult: Secondary | ICD-10-CM

## 2018-07-03 DIAGNOSIS — E559 Vitamin D deficiency, unspecified: Secondary | ICD-10-CM | POA: Diagnosis not present

## 2018-07-03 DIAGNOSIS — R7303 Prediabetes: Secondary | ICD-10-CM | POA: Diagnosis not present

## 2018-07-03 MED ORDER — VITAMIN D (ERGOCALCIFEROL) 1.25 MG (50000 UNIT) PO CAPS: 50000.0000 [IU] | ORAL_CAPSULE | ORAL | 0 refills | Status: DC

## 2018-07-03 MED ORDER — METFORMIN HCL 500 MG PO TABS: 500.0000 mg | ORAL_TABLET | Freq: Every day | ORAL | 0 refills | Status: DC

## 2018-07-03 MED FILL — VIT D2 1.25 MG (50,000 UNIT: 1.25 MG | 14 days supply | Qty: 2 | Fill #0

## 2018-07-03 MED FILL — metFORMIN HCL 500 MG TABS: 500 | 14 days supply | Qty: 14 | Fill #0

## 2018-07-07 NOTE — Progress Notes (Signed)
Office: [phone]  /  Fax: [phone]   HPI:   Chief Complaint: OBESITY Emily Osborne is here to discuss her progress with her obesity treatment plan. She is on the Category 2 plan and is following her eating plan approximately 60 % of the time. She states she is exercising 0 minutes 0 times per week. Layal did well with weight loss. She reports being under a lot of stress at home and was off track for a short period. She has been eating more on the plan the last week.  Her weight is 222 lb (100.7 kg) today and has had a weight loss of 2 pounds over a period of 9 weeks since her last visit. She has lost 4 lbs since starting treatment with [place].  Vitamin D Deficiency Emily Osborne has a diagnosis of vitamin D deficiency. She is currently taking prescription Vit D and denies nausea, vomiting or muscle weakness.  Pre-Diabetes Emily Osborne has a diagnosis of pre-diabetes based on her elevated Hgb A1c and was informed this puts her at greater risk of developing diabetes. She denies nausea, vomiting, or diarrhea on metformin and continues to work on diet and exercise to decrease risk of diabetes. She denies polyphagia or hypoglycemia.  At risk for diabetes Emily Osborne is at higher than average risk for developing diabetes due to her obesity and pre-diabetes. She currently denies polyuria or polydipsia.  ALLERGIES: Allergies  Allergen Reactions  . Aspirin Other (See Comments)    REACTION: anaphylaxis  . Gentamicin Other (See Comments)    Eye drops turned the sclera bright red  . Ibuprofen Other (See Comments)    REACTION: anaphylaxsis  . Metronidazole Swelling    REACTION: red face/swelling  . Nsaids Other (See Comments)    REACTION: anaphylaxis  . Doxycycline Other (See Comments)    REACTION: severe nausea/vomiting  . Influenza A (H1n1) Monoval Vac Other (See Comments)    REACTION: sick for 3 weeks  . Loratadine Other (See Comments)    Fatigue/weakness  . Periactin [Cyproheptadine] Other (See  Comments)    paranoid   . Ranitidine Hcl Other (See Comments)    REACTION: Lips turned red /peel  . Sulfamethoxazole-Trimethoprim Other (See Comments)    REACTION: face red/peel  . Tramadol Hcl Other (See Comments)    REACTION: paranoid    MEDICATIONS: Current Outpatient Medications on File Prior to Visit  Medication Sig Dispense Refill  . acetaminophen (TYLENOL) 500 MG tablet Take 1,000 mg by mouth every 6 (six) hours as needed for moderate pain.     Marland Kitchen ADVAIR DISKUS 250-50 MCG/DOSE AEPB TAKE 1 PUFF BY MOUTH TWICE A DAY 60 each 2  . albuterol (PROAIR HFA) 108 (90 Base) MCG/ACT inhaler USE 2 PUFFS EVERY 4 HOURS AS NEEDED FOR COUGH, WHEEZE OR SHORTNESS OF BREATH. 8.5 each 2  . ALPRAZolam (XANAX) 0.25 MG tablet Take 1 tablet (0.25 mg total) by mouth 3 (three) times daily as needed. 30 tablet 2  . atenolol (TENORMIN) 25 MG tablet TAKE 1/2 TABLETS (12.5 MG TOTAL) BY MOUTH DAILY 45 tablet 3  . azelastine (OPTIVAR) 0.05 % ophthalmic solution Place 1 drop into both eyes 2 (two) times daily. 6 mL 12  . Blood Glucose Monitoring Suppl (FREESTYLE FREEDOM) KIT 1 Device by Does not apply route daily. 1 each 0  . dexlansoprazole (DEXILANT) 60 MG capsule Take 1 capsule (60 mg total) by mouth daily. 90 capsule 3  . fexofenadine (ALLEGRA) 180 MG tablet Take 1 tablet (180 mg total) by mouth daily. [place]  tablet 3  . Flaxseed, Linseed, (FLAXSEED OIL PO) Take by mouth. One capsule daily    . fluticasone (FLONASE) 50 MCG/ACT nasal spray PLACE 2 SPRAYS INTO BOTH NOSTRILS DAILY. 16 g 4  . FREESTYLE LITE test strip USE TO CHECK BLOOD SUGAR ONCE A DAY 100 each 0  . Iron Combinations (I.L.X. B-12) ELIX Take 1 each by mouth daily as needed (energy). 1 droplet full    . Multiple Vitamins-Minerals (MULTIVITAMIN WITH MINERALS) tablet Take 1 tablet by mouth daily.    . valACYclovir (VALTREX) 1000 MG tablet Take 1 tablet (1,000 mg total) by mouth 3 (three) times daily. (Patient taking differently: Take 1,000 mg by mouth as  needed (3 times daily as needed). As needed) 30 tablet 5   No current facility-administered medications on file prior to visit.     PAST MEDICAL HISTORY: Past Medical History:  Diagnosis Date  . Allergy   . Anxiety   . Asthma    pt has inhaler  . Back pain   . C. difficile colitis   . Cataract    bil eyes  . Chest pain   . Chronic fatigue syndrome   . Clostridium difficile colitis 12/13/2016  . Diverticulitis   . Dyspnea   . Dysrhythmia    palpitations  . Edema   . Elevated blood pressure reading without diagnosis of hypertension    "just elevated when I'm in pain" (12/07/2015)  . Fatty liver   . Fibromyalgia   . Ganglion of joint    right wrist  . GERD (gastroesophageal reflux disease)   . H. pylori infection   . Headache    hx of  . Helicobacter pylori (H. pylori) infection 12/13/2016  . Herpes zoster without mention of complication   . HTN (hypertension)   . Hyperlipidemia   . Hyperthyroidism   . IBS (irritable bowel syndrome)   . Leg edema   . Multiple drug allergies 06/18/2017  . Myalgia   . Nontraumatic rupture of Achilles tendon   . Other acute reactions to stress   . Pain in joint, shoulder region   . Pain in joint, upper arm   . Pain in limb   . Palpitations   . Personal history of other diseases of digestive system   . Pneumonia    once  . PONV (postoperative nausea and vomiting)   . Routine screening for STI (sexually transmitted infection) 12/13/2016  . S/P partial colectomy 06/18/2017  . Thyroid disease    hyperthyroidism  . Vitamin B 12 deficiency     PAST SURGICAL HISTORY: Past Surgical History:  Procedure Laterality Date  . ABDOMINAL HYSTERECTOMY    . ACHILLES TENDON REPAIR Right 2005  . BUNIONECTOMY Bilateral    Bunionectomy 1983  . COLON SURGERY  01/23/2017   6 to 8 inches sigmoid colon removed  . GANGLION CYST EXCISION Right    wrist  . LEFT HEART CATH AND CORONARY ANGIOGRAPHY N/A 10/03/2016   Procedure: Left Heart Cath and  Coronary Angiography;  Surgeon: Burnell Blanks, MD;  Location: [facility];  Service: Cardiovascular;  Laterality: N/A;  . MENISCUS REPAIR Right 04/2014  . TUBAL LIGATION    . wisdomteeth extraction      SOCIAL HISTORY: Social History   Tobacco Use  . Smoking status: Never Smoker  . Smokeless tobacco: Never Used  Substance Use Topics  . Alcohol use: No    Alcohol/week: 0.0 standard drinks  . Drug use: No    FAMILY  HISTORY: Family History  Problem Relation Age of Onset  . Hypertension Father   . Diabetes Father   . Heart disease Father   . Kidney disease Father        Died, 63  . Hyperlipidemia Father   . Leukemia Brother   . Cancer Brother        myleoblastic anemia  . Hyperlipidemia Mother   . Hypertension Mother        Died, 53  . Thyroid disease Mother        Thyroid surgery  . Obesity Mother   . Heart disease Mother   . Pernicious anemia Sister   . Thyroid disease Sister        On thyroid Rx  . Lupus Daughter   . Coronary artery disease Brother   . Hypertension Brother   . Colon cancer Neg Hx   . Esophageal cancer Neg Hx   . Rectal cancer Neg Hx   . Stomach cancer Neg Hx   . Pancreatic cancer Neg Hx   . Prostate cancer Neg Hx     ROS: Review of Systems  Constitutional: Positive for weight loss.  Gastrointestinal: Negative for diarrhea, nausea and vomiting.  Genitourinary: Negative for frequency.  Musculoskeletal:       Negative muscle weakness  Endo/Heme/Allergies: Negative for polydipsia.       Negative polyphagia Negative hypoglycemia    PHYSICAL EXAM: Blood pressure 107/73, pulse 64, temperature 98.2 F (36.8 C), temperature source Oral, height 5' 5"  (1.651 m), weight 222 lb (100.7 kg), SpO2 98 %. Body mass index is 36.94 kg/m. Physical Exam  Constitutional: She is oriented to person, place, and time. She appears well-developed and well-nourished.  Cardiovascular: Normal rate.  Pulmonary/Chest: Effort normal.    Musculoskeletal: Normal range of motion.  Neurological: She is oriented to person, place, and time.  Skin: Skin is warm and dry.  Psychiatric: She has a normal mood and affect. Her behavior is normal.  Vitals reviewed.   RECENT LABS AND TESTS: BMET    Component Value Date/Time   NA 145 06/05/2018 1638   NA 142 03/20/2018 1046   K 4.4 06/05/2018 1638   CL 105 06/05/2018 1638   CO2 29 06/05/2018 1638   GLUCOSE 95 06/05/2018 1638   BUN 16 06/05/2018 1638   BUN 17 03/20/2018 1046   CREATININE 1.01 06/05/2018 1638   CREATININE 0.97 03/23/2016 1501   CALCIUM 9.8 06/05/2018 1638   GFRNONAA 65 03/20/2018 1046   GFRAA 75 03/20/2018 1046   Lab Results  Component Value Date   HGBA1C 6.0 05/13/2018   HGBA1C 5.6 03/20/2018   HGBA1C 5.8 (H) 12/12/2017   HGBA1C 5.5 01/15/2017   HGBA1C 5.9 (H) 12/07/2015   Lab Results  Component Value Date   INSULIN 20.0 03/20/2018   INSULIN 17.6 12/12/2017   CBC    Component Value Date/Time   WBC 5.9 06/05/2018 1638   RBC 4.55 06/05/2018 1638   HGB 13.1 06/05/2018 1638   HGB 13.5 12/12/2017 1102   HCT 40.3 06/05/2018 1638   HCT 41.8 12/12/2017 1102   PLT 258.0 06/05/2018 1638   MCV 88.4 06/05/2018 1638   MCV 89 12/12/2017 1102   MCH 28.6 12/12/2017 1102   MCH 28.9 02/08/2017 0909   MCHC 32.6 06/05/2018 1638   RDW 14.8 06/05/2018 1638   RDW 15.1 12/12/2017 1102   LYMPHSABS 3.0 06/05/2018 1638   LYMPHSABS 2.3 12/12/2017 1102   MONOABS 0.6 06/05/2018 1638   EOSABS 0.2 06/05/2018  1638   EOSABS 0.1 12/12/2017 1102   BASOSABS 0.0 06/05/2018 1638   BASOSABS 0.0 12/12/2017 1102   Iron/TIBC/Ferritin/ %Sat No results found for: IRON, TIBC, FERRITIN, IRONPCTSAT Lipid Panel     Component Value Date/Time   CHOL 197 03/20/2018 1046   TRIG 78 03/20/2018 1046   HDL 57 03/20/2018 1046   CHOLHDL 4 10/11/2017 1027   VLDL 20.6 10/11/2017 1027   LDLCALC 124 (H) 03/20/2018 1046   LDLDIRECT 139.4 12/26/2011 0905   Hepatic Function Panel      Component Value Date/Time   PROT 6.7 06/05/2018 1638   PROT 6.8 03/20/2018 1046   ALBUMIN 4.1 06/05/2018 1638   ALBUMIN 4.4 03/20/2018 1046   AST 17 06/05/2018 1638   ALT 19 06/05/2018 1638   ALKPHOS 59 06/05/2018 1638   BILITOT 0.3 06/05/2018 1638   BILITOT 0.4 03/20/2018 1046   BILIDIR 0.1 09/07/2014 1031      Component Value Date/Time   TSH 0.67 06/05/2018 1638   TSH 0.74 05/13/2018 1513   TSH 0.83 11/13/2017 0847  Results for Desroches, Aireanna W "[name]" (MRN [id number]) as of 07/07/2018 09:49  Ref. Range 03/20/2018 10:46  Vitamin D, 25-Hydroxy Latest Ref Range: 30.0 - 100.0 ng/mL 31.0    ASSESSMENT AND PLAN: Vitamin D deficiency - Plan: Vitamin D, Ergocalciferol, (DRISDOL) 1.25 MG (50000 UT) CAPS capsule  Prediabetes - Plan: metFORMIN (GLUCOPHAGE) 500 MG tablet  At risk for diabetes mellitus  Class 2 severe obesity with serious comorbidity and body mass index (BMI) of 37.0 to 37.9 in adult, unspecified obesity type ([place])  PLAN:  Vitamin D Deficiency Emily Osborne was informed that low vitamin D levels contributes to fatigue and are associated with obesity, breast, and colon cancer. Emily Osborne agrees to continue taking prescription Vit D @50 ,000 IU every week #4 and we will refill for 1 month. She will follow up for routine testing of vitamin D, at least 2-3 times per year. She was informed of the risk of over-replacement of vitamin D and agrees to not increase her dose unless she discusses this with [place] first. Emily Osborne agrees to follow up with our clinic in 3 weeks.  Pre-Diabetes Emily Osborne will continue to work on weight loss, exercise, and decreasing simple carbohydrates in her diet to help decrease the risk of diabetes. We dicussed metformin including benefits and risks. She was informed that eating too many simple carbohydrates or too many calories at one sitting increases the likelihood of GI side effects. Emily Osborne agrees to continue taking metformin 500 mg q AM #30 and we will refill for 1  month. Emily Osborne agrees to follow up with our clinic in 3 weeks as directed to monitor her progress.  Diabetes risk counselling Emily Osborne was given extended (15 minutes) diabetes prevention counseling today. She is 57 y.o. female and has risk factors for diabetes including obesity and pre-diabetes. We discussed intensive lifestyle modifications today with an emphasis on weight loss as well as increasing exercise and decreasing simple carbohydrates in her diet.  Obesity Emily Osborne is currently in the action stage of change. As such, her goal is to continue with weight loss efforts She has agreed to follow the Category 2 plan Emily Osborne has been instructed to work up to a goal of 150 minutes of combined cardio and strengthening exercise per week for weight loss and overall health benefits. We discussed the following Behavioral Modification Strategies today: decreasing simple carbohydrates  and work on meal planning and easy cooking plans   Emily Osborne has agreed to follow  up with our clinic in 3 weeks. She was informed of the importance of frequent follow up visits to maximize her success with intensive lifestyle modifications for her multiple health conditions.   OBESITY BEHAVIORAL INTERVENTION VISIT  Today's visit was # 9   Starting weight: 226 lbs Starting date: 12/12/17 Today's weight : 222 lbs Today's date: 07/03/2018 Total lbs lost to date: 4    ASK: We discussed the diagnosis of obesity with Emily Osborne today and Emily Osborne agreed to give [place] permission to discuss obesity behavioral modification therapy today.  ASSESS: Emily Osborne has the diagnosis of obesity and her BMI today is 36.94 Emily Osborne is in the action stage of change   ADVISE: Emily Osborne was educated on the multiple health risks of obesity as well as the benefit of weight loss to improve her health. She was advised of the need for long term treatment and the importance of lifestyle modifications.  AGREE: Multiple dietary modification options  and treatment options were discussed and  Emily Osborne agreed to the above obesity treatment plan.  Emily Osborne, am acting as transcriptionist for Abby Potash, PA-C I, Abby Potash, PA-C have reviewed above note and agree with its content

## 2018-07-08 ENCOUNTER — Other Ambulatory Visit: Payer: Self-pay | Admitting: *Deleted

## 2018-07-08 ENCOUNTER — Encounter: Payer: BC Managed Care – PPO | Admitting: Family Medicine

## 2018-07-08 DIAGNOSIS — J4520 Mild intermittent asthma, uncomplicated: Secondary | ICD-10-CM

## 2018-07-08 MED ORDER — FLUTICASONE-SALMETEROL 250-50 MCG/DOSE IN AEPB: INHALATION_SPRAY | RESPIRATORY_TRACT | 2 refills | Status: DC

## 2018-07-08 MED FILL — ADVAIR 250/50 DISKUS: 250-50 | 30 days supply | Qty: 60 | Fill #0

## 2018-07-09 ENCOUNTER — Other Ambulatory Visit: Payer: Self-pay | Admitting: Internal Medicine

## 2018-07-15 ENCOUNTER — Other Ambulatory Visit: Payer: 59

## 2018-07-15 DIAGNOSIS — A048 Other specified bacterial intestinal infections: Secondary | ICD-10-CM | POA: Diagnosis not present

## 2018-07-16 ENCOUNTER — Encounter (INDEPENDENT_AMBULATORY_CARE_PROVIDER_SITE_OTHER): Payer: Self-pay | Admitting: Physician Assistant

## 2018-07-16 ENCOUNTER — Other Ambulatory Visit: Payer: Self-pay | Admitting: *Deleted

## 2018-07-16 MED ORDER — DEXLANSOPRAZOLE 60 MG PO CPDR: 1.0000 | DELAYED_RELEASE_CAPSULE | Freq: Every day | ORAL | 0 refills | Status: DC

## 2018-07-18 LAB — HELICOBACTER PYLORI  SPECIAL ANTIGEN
MICRO NUMBER:: 91424918
SPECIMEN QUALITY: ADEQUATE

## 2018-07-22 ENCOUNTER — Telehealth (INDEPENDENT_AMBULATORY_CARE_PROVIDER_SITE_OTHER): Payer: Self-pay | Admitting: Physician Assistant

## 2018-07-22 ENCOUNTER — Encounter: Payer: Self-pay | Admitting: Cardiology

## 2018-07-22 ENCOUNTER — Ambulatory Visit: Payer: 59 | Admitting: Cardiology

## 2018-07-22 ENCOUNTER — Other Ambulatory Visit (INDEPENDENT_AMBULATORY_CARE_PROVIDER_SITE_OTHER): Payer: Self-pay | Admitting: Physician Assistant

## 2018-07-22 VITALS — BP 122/76 | HR 64 | Ht 65.0 in | Wt 230.0 lb

## 2018-07-22 DIAGNOSIS — R079 Chest pain, unspecified: Secondary | ICD-10-CM | POA: Diagnosis not present

## 2018-07-22 DIAGNOSIS — E6609 Other obesity due to excess calories: Secondary | ICD-10-CM | POA: Diagnosis not present

## 2018-07-22 DIAGNOSIS — Z7189 Other specified counseling: Secondary | ICD-10-CM

## 2018-07-22 DIAGNOSIS — E559 Vitamin D deficiency, unspecified: Secondary | ICD-10-CM

## 2018-07-22 DIAGNOSIS — R002 Palpitations: Secondary | ICD-10-CM

## 2018-07-22 DIAGNOSIS — Z6838 Body mass index (BMI) 38.0-38.9, adult: Secondary | ICD-10-CM

## 2018-07-22 DIAGNOSIS — R7303 Prediabetes: Secondary | ICD-10-CM

## 2018-07-22 NOTE — Telephone Encounter (Signed)
Pt states that [place] [facility] hasn't received any refill request for her Vit D and Metformin.  She states she has been out for a few days.  She is scheduled to see [facility] 12/5.  Please advise. Thank you

## 2018-07-22 NOTE — Progress Notes (Signed)
Cardiology Office Note:    Date:  07/22/2018   ID:  Emily Osborne Dec 31, 1960, MRN [id number]  PCP:  Emily Osborne, Emily Apa, DO  Cardiologist:  Buford Dresser, MD PhD  Referring MD: Emily Osborne, Emily Osborne, *   CC: chest pain  History of Present Illness:    Emily Osborne is a 57 y.o. female with extensive PMH listed below who is seen as a new patient to me/cardiology follow up at the request of Emily Osborne, * for the evaluation and management of chest pain.  Chest pain: -Initial onset: first occurred in 2018, prior to her heart cath -Quality: left pectoral region, grips her to the point that she can't breath until it passes. Sometimes left arm also aches, better if she lifts the arm. Sometimes the arm hurts without the chest, sometimes with it. Occasionally shortness of breath with it, one time nausea with it, no diaphoresis -Frequency: about once a week -Duration: usually 2-3 minutes at the longest -Associated symptoms: some arm pain on occasion, some shortness of breath -Aggravating/alleviating factors: better when she takes an extra dose of atenolol. Not related to exertion or position. Worse if she misses a dose of atenolol.  -Prior cardiac history: only briefly treated with amlodipine for HTN, during a stressful time. No change in symptoms on amlodipine, if anything more dizzy. Has history of palpitations, on atenolol. Atenolol seems to help with chest pain as well.  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 Has felt rare presyncope. -Family history: mother had AAA,  hypertension, deceased age 84;  father had Mis, diabetes, hypertension, kidney failure. Died age 52 from kidney failure. Brother died around age 44, had heart disease and diabetes.   Past Medical History:  Diagnosis Date  . Allergy   . Anxiety   . Asthma    pt has inhaler  . Back pain   . C. difficile colitis   . Cataract    bil eyes  . Chest pain   . Chronic fatigue syndrome   . Clostridium difficile colitis 12/13/2016  . Diverticulitis   . Dyspnea   . Dysrhythmia    palpitations  . Edema   . Elevated blood pressure reading without diagnosis of hypertension    "just elevated when I'm in pain" (12/07/2015)  . Fatty liver   . Fibromyalgia   . Ganglion of joint    right wrist  . GERD (gastroesophageal reflux disease)   . H. pylori infection   . Headache    hx of  . Helicobacter pylori (H. pylori) infection 12/13/2016  . Herpes zoster without mention of complication   . HTN (hypertension)   . Hyperlipidemia   . Hyperthyroidism   . IBS (irritable bowel syndrome)   . Leg edema   . Multiple drug allergies 06/18/2017  . Myalgia   . Nontraumatic rupture of Achilles tendon   . Other acute reactions to stress   . Pain in joint, shoulder region   . Pain in joint, upper arm   . Pain in limb   . Palpitations   . Personal history of other diseases of digestive system   .  Pneumonia    once  . PONV (postoperative nausea and vomiting)   . Routine screening for STI (sexually transmitted infection) 12/13/2016  . S/P partial colectomy 06/18/2017  . Thyroid disease    hyperthyroidism  . Vitamin B 12 deficiency     Past Surgical History:  Procedure Laterality Date  . ABDOMINAL HYSTERECTOMY    . ACHILLES TENDON REPAIR Right 2005  . BUNIONECTOMY Bilateral    Bunionectomy 1983  . COLON SURGERY  01/23/2017   6 to 8 inches sigmoid colon removed  . GANGLION CYST EXCISION Right    wrist  . LEFT HEART CATH AND CORONARY ANGIOGRAPHY N/A 10/03/2016   Procedure: Left Heart Cath and  Coronary Angiography;  Surgeon: Burnell Blanks, MD;  Location: [facility];  Service: Cardiovascular;  Laterality: N/A;  . MENISCUS REPAIR Right 04/2014  . TUBAL LIGATION    . wisdomteeth extraction      Current Medications: Current Outpatient Medications on File Prior to Visit  Medication Sig  . acetaminophen (TYLENOL) 500 MG tablet Take 1,000 mg by mouth every 6 (six) hours as needed for moderate pain.   Marland Kitchen albuterol (PROAIR HFA) 108 (90 Base) MCG/ACT inhaler USE 2 PUFFS EVERY 4 HOURS AS NEEDED FOR COUGH, WHEEZE OR SHORTNESS OF BREATH.  Marland Kitchen ALPRAZolam (XANAX) 0.25 MG tablet Take 1 tablet (0.25 mg total) by mouth 3 (three) times daily as needed.  Marland Kitchen atenolol (TENORMIN) 25 MG tablet TAKE 1/2 TABLETS (12.5 MG TOTAL) BY MOUTH DAILY  . azelastine (OPTIVAR) 0.05 % ophthalmic solution Place 1 drop into both eyes 2 (two) times daily. (Patient taking differently: Place 1 drop into both eyes as needed. )  . Blood Glucose Monitoring Suppl (FREESTYLE FREEDOM) KIT 1 Device by Does not apply route daily.  Marland Kitchen dexlansoprazole (DEXILANT) 60 MG capsule Take 1 capsule (60 mg total) by mouth daily.  . fexofenadine (ALLEGRA) 180 MG tablet Take 1 tablet (180 mg total) by mouth daily.  . Flaxseed, Linseed, (FLAXSEED OIL PO) Take by mouth. One capsule daily  . fluticasone (FLONASE) 50 MCG/ACT nasal spray PLACE 2 SPRAYS INTO BOTH NOSTRILS DAILY.  Marland Kitchen Fluticasone-Salmeterol (ADVAIR DISKUS) 250-50 MCG/DOSE AEPB TAKE 1 PUFF BY MOUTH TWICE A DAY  . FREESTYLE LITE test strip USE TO CHECK BLOOD SUGAR ONCE A DAY  . Iron Combinations (I.L.X. B-12) ELIX Take 1 each by mouth daily as needed (energy). 1 droplet full  . metFORMIN (GLUCOPHAGE) 500 MG tablet Take 1 tablet (500 mg total) by mouth daily with breakfast.  . Multiple Vitamins-Minerals (MULTIVITAMIN WITH MINERALS) tablet Take 1 tablet by mouth daily.  . valACYclovir (VALTREX) 1000 MG tablet Take 1 tablet (1,000 mg total) by mouth 3 (three) times daily. (Patient  taking differently: Take 1,000 mg by mouth 3 (three) times daily as needed (3 times daily as needed). As needed)  . Vitamin D, Ergocalciferol, (DRISDOL) 1.25 MG (50000 UT) CAPS capsule Take 1 capsule (50,000 Units total) by mouth every 7 (seven) days.   No current facility-administered medications on file prior to visit.      Allergies:   Aspirin; Gentamicin; Ibuprofen; Metronidazole; Nsaids; Doxycycline; Influenza a (h1n1) monoval vac; Loratadine; Periactin [cyproheptadine]; Ranitidine hcl; Sulfamethoxazole-trimethoprim; and Tramadol hcl   Social History   Socioeconomic History  . Marital status: Divorced    Spouse name: Not on file  . Number of children: 2  . Years of education: Not on file  . Highest education level: Not on file  Occupational History  . Occupation: Personnel officer  Social Needs  . Financial resource strain: Not on file  . Food insecurity:    Worry: Not on file    Inability: Not on file  . Transportation needs:    Medical: Not on file    Non-medical: Not on file  Tobacco Use  . Smoking status: Never Smoker  . Smokeless tobacco: Never Used  Substance and Sexual Activity  . Alcohol use: No    Alcohol/week: 0.0 standard drinks  . Drug use: No  . Sexual activity: Not Currently    Partners: Male  Lifestyle  . Physical activity:    Days per week: Not on file    Minutes per session: Not on file  . Stress: Not on file  Relationships  . Social connections:    Talks on phone: Not on file    Gets together: Not on file    Attends religious service: Not on file    Active member of club or organization: Not on file    Attends meetings of clubs or organizations: Not on file    Relationship status: Not on file  Other Topics Concern  . Not on file  Social History Narrative   Lives with alone.  She has two daughter.   She works as a Programme researcher, broadcasting/film/video at [employer]--- no     Family History: The patient's family history includes Cancer in her brother;  Coronary artery disease in her brother; Diabetes in her father; Heart disease in her father and mother; Hyperlipidemia in her father and mother; Hypertension in her brother, father, and mother; Kidney disease in her father; Leukemia in her brother; Lupus in her daughter; Obesity in her mother; Pernicious anemia in her sister; Thyroid disease in her mother and sister. There is no history of Colon cancer, Esophageal cancer, Rectal cancer, Stomach cancer, Pancreatic cancer, or Prostate cancer.  ROS:   Please see the history of present illness.  Additional pertinent ROS:  Constitutional: Negative for chills, fever, night sweats, unintentional weight loss  HENT: Negative for ear pain and hearing loss.   Eyes: Negative for loss of vision and eye pain.  Respiratory: Negative for cough, sputum, shortness of breath, wheezing.   Cardiovascular: Positive for chest pain as noted, LE edema. Negative for PND, orthopnea, and claudication.  Gastrointestinal: Negative for abdominal pain, melena, and hematochezia.  Genitourinary: Negative for dysuria and hematuria.  Musculoskeletal: Negative for falls and positive for myalgias.  Skin: Negative for itching and rash.  Neurological: Negative for focal weakness, focal sensory changes and loss of consciousness. Positive for dizziness Endo/Heme/Allergies: Does not bruise/bleed easily.    EKGs/Labs/Other Studies Reviewed:    The following studies were reviewed today: [facility] 10-06-16  The left ventricular systolic function is normal.  LV end diastolic pressure is no`rmal.  The left ventricular ejection fraction is greater than 65% by visual estimate.  There is no mitral valve regurgitation.   1. No angiographic evidence of CAD 2. Normal LV systolic function 3. Normal LVEDP  Recommendations: NO further ischemic workup.   Echo 12/08/15 Study Conclusions  - Left ventricle: The cavity size was normal. Systolic function was   normal. The estimated ejection  fraction was in the range of 60%   to 65%. Wall motion was normal; there were no regional wall   motion abnormalities. Doppler parameters are consistent with   abnormal left ventricular relaxation (grade 1 diastolic   dysfunction). There was no evidence of elevated ventricular   filling pressure by Doppler parameters. - Aortic  valve: There was no regurgitation. - Aortic root: The aortic root was normal in size. - Mitral valve: Structurally normal valve. There was no   regurgitation. - Left atrium: The atrium was normal in size. - Right ventricle: Systolic function was normal. - Right atrium: The atrium was normal in size. - Tricuspid valve: There was no regurgitation. - Pulmonic valve: There was no regurgitation. - Pulmonary arteries: Systolic pressure was within the normal   range. - Inferior vena cava: The vessel was normal in size. - Pericardium, extracardiac: There was no pericardial effusion.  EKG:  EKG is personally reviewed.  The ekg ordered today demonstrates NSR with nonspecific TW changes similar to prior  Recent Labs: 06/05/2018: ALT 19; BUN 16; Creatinine, Ser 1.01; Hemoglobin 13.1; Platelets 258.0; Potassium 4.4; Sodium 145; TSH 0.67  Recent Lipid Panel    Component Value Date/Time   CHOL 197 03/20/2018 1046   TRIG 78 03/20/2018 1046   HDL 57 03/20/2018 1046   CHOLHDL 4 10/11/2017 1027   VLDL 20.6 10/11/2017 1027   LDLCALC 124 (H) 03/20/2018 1046   LDLDIRECT 139.4 12/26/2011 0905    Physical Exam:    VS:  BP 122/76   Pulse 64   Ht 5' 5"  (1.651 m)   Wt 230 lb 0.6 oz (104.3 kg)   BMI 38.28 kg/m     Wt Readings from Last 3 Encounters:  07/22/18 230 lb 0.6 oz (104.3 kg)  07/03/18 222 lb (100.7 kg)  06/25/18 227 lb 12.8 oz (103.3 kg)     GEN: Well nourished, well developed in no acute distress HEENT: Normal NECK: No JVD; No carotid bruits LYMPHATICS: No lymphadenopathy CARDIAC: regular rhythm, normal S1 and S2, no murmurs, rubs, gallops. Radial and DP  pulses 2+ bilaterally. RESPIRATORY:  Clear to auscultation without rales, wheezing or rhonchi  ABDOMEN: Soft, non-tender, non-distended MUSCULOSKELETAL:  Trace LE bilateral edema; No deformity  SKIN: Warm and dry NEUROLOGIC:  Alert and oriented x 3 PSYCHIATRIC:  Normal affect   ASSESSMENT:    1. Chest pain, unspecified type   2. Palpitation   3. Counseling on health promotion and disease prevention   4. Class 2 obesity due to excess calories without serious comorbidity with body mass index (BMI) of 38.0 to 38.9 in adult    PLAN:    1. Chest pain: not accelerating, same as the symptoms she had prior to cath. Cath showed no CAD, unlikely that this would have progressed and caused her pain. Given that she has already had a cath as the gold standard test, low utility in treadmill test for further evaluation. Did discuss that her symptoms may actually be palpitations, and this is in line with the fact that they improve with additional atenolol. She has not been evaluated for arrhythmia in the past.  -order Zio patch -if this is unremarkable, it is likely not a cardiac cause of her pain.  2. Primary prevention (no evidence of CAD on cath) -recommend heart healthy/Mediterranean diet, with whole grains, fruits, vegetable, fish, lean meats, nuts, and olive oil. Limit salt. -recommend moderate walking, 3-5 times/week for 30-50 minutes each session. Aim for at least 150 minutes.week. Goal should be pace of 3 miles/hours, or walking 1.5 miles in 30 minutes -recommend avoidance of tobacco products. Avoid excess alcohol. -Additional risk factor control:  -Diabetes: A1c is 6.0, on metformin for weight loss  -Lipids: LDL 124, but in low risk category. Lifestyle for now, consider statin if risk elevates or develops diabetes  -Blood pressure  control: on atenolol for symptoms, denies HTN. At goal today.   -Weight: BMI 38, counseled on lifestyle -ASCVD risk score: The 10-year ASCVD risk score Mikey Bussing DC  Brooke Bonito., et al., 2013) is: 5%   Values used to calculate the score:     Age: 32 years     Sex: Female     Is Non-Hispanic African American: Yes     Diabetic: No     Tobacco smoker: No     Systolic Blood Pressure: 888 mmHg     Is BP treated: Yes     HDL Cholesterol: 57 mg/dL     Total Cholesterol: 197 mg/dL   Plan for follow up: 1 year or sooner PRN based on results of testing  TIME SPENT WITH PATIENT: >25 minutes of direct patient care. More than 50% of that time was spent on coordination of care and counseling regarding chest pain causes, evaluation, management.  Buford Dresser, MD, PhD [place]  [facility]   Medication Adjustments/Labs and Tests Ordered: Current medicines are reviewed at length with the patient today.  Concerns regarding medicines are outlined above.  Orders Placed This Encounter  Procedures  . LONG TERM MONITOR (3-14 DAYS)  . EKG 12-Lead   No orders of the defined types were placed in this encounter.   Patient Instructions  Medication Instructions:  Your Physician recommend you continue on your current medication as directed.    If you need a refill on your cardiac medications before your next appointment, please call your pharmacy.   Lab work: None  Testing/Procedures: Our physician has recommended that you wear an [place] monitor. The Zio patch cardiac monitor continuously records heart rhythm data for up to 14 days, this is for patients being evaluated for multiple types heart rhythms. For the first 24 hours post application, please avoid getting the Zio monitor wet in the shower or by excessive sweating during exercise. After that, feel free to carry on with regular activities. Keep soaps and lotions away from the ZIO XT Patch.   [facility].      Follow-Up: Your physician recommends that you schedule a follow-up appointment in 1 year with Dr. Harrell Gave   Any Other Special Instructions Will Be Listed Below (If  Applicable).       Signed, Buford Dresser, MD PhD 07/22/2018 2:58 PM    [place]

## 2018-07-22 NOTE — Patient Instructions (Signed)
Medication Instructions:  Your Physician recommend you continue on your current medication as directed.    If you need a refill on your cardiac medications before your next appointment, please call your pharmacy.   Lab work: None  Testing/Procedures: Our physician has recommended that you wear an [place] monitor. The Zio patch cardiac monitor continuously records heart rhythm data for up to 14 days, this is for patients being evaluated for multiple types heart rhythms. For the first 24 hours post application, please avoid getting the Zio monitor wet in the shower or by excessive sweating during exercise. After that, feel free to carry on with regular activities. Keep soaps and lotions away from the ZIO XT Patch.   [facility].      Follow-Up: Your physician recommends that you schedule a follow-up appointment in 1 year with Dr. Harrell Gave   Any Other Special Instructions Will Be Listed Below (If Applicable).

## 2018-07-23 ENCOUNTER — Encounter: Payer: Self-pay | Admitting: Cardiology

## 2018-07-24 ENCOUNTER — Ambulatory Visit: Payer: 59

## 2018-07-24 ENCOUNTER — Telehealth (INDEPENDENT_AMBULATORY_CARE_PROVIDER_SITE_OTHER): Payer: Self-pay | Admitting: Physician Assistant

## 2018-07-24 ENCOUNTER — Ambulatory Visit (INDEPENDENT_AMBULATORY_CARE_PROVIDER_SITE_OTHER): Payer: 59 | Admitting: Physician Assistant

## 2018-07-24 ENCOUNTER — Encounter (INDEPENDENT_AMBULATORY_CARE_PROVIDER_SITE_OTHER): Payer: Self-pay | Admitting: Physician Assistant

## 2018-07-24 VITALS — BP 108/70 | HR 58 | Temp 97.9°F | Ht 65.0 in | Wt 224.0 lb

## 2018-07-24 DIAGNOSIS — R7303 Prediabetes: Secondary | ICD-10-CM

## 2018-07-24 DIAGNOSIS — E559 Vitamin D deficiency, unspecified: Secondary | ICD-10-CM | POA: Diagnosis not present

## 2018-07-24 DIAGNOSIS — Z6837 Body mass index (BMI) 37.0-37.9, adult: Secondary | ICD-10-CM | POA: Diagnosis not present

## 2018-07-24 DIAGNOSIS — Z9189 Other specified personal risk factors, not elsewhere classified: Secondary | ICD-10-CM | POA: Diagnosis not present

## 2018-07-24 MED ORDER — VITAMIN D (ERGOCALCIFEROL) 1.25 MG (50000 UNIT) PO CAPS: 50000.0000 [IU] | ORAL_CAPSULE | ORAL | 0 refills | Status: DC

## 2018-07-24 MED ORDER — METFORMIN HCL 500 MG PO TABS: 500.0000 mg | ORAL_TABLET | Freq: Every day | ORAL | 0 refills | Status: DC

## 2018-07-24 MED FILL — metFORMIN HCL 500 MG TABS: 500 | 30 days supply | Qty: 30 | Fill #0

## 2018-07-24 MED FILL — VIT D2 1.25 MG (50,000 UNIT: 1.25 MG | 28 days supply | Qty: 4 | Fill #0

## 2018-07-24 NOTE — Telephone Encounter (Signed)
Medication refilled on 07/24/18 visit. Collen Hostler, [place]

## 2018-07-28 NOTE — Progress Notes (Signed)
Office: [phone]  /  Fax: [phone]   HPI:   Chief Complaint: OBESITY Emily Osborne is here to discuss her progress with her obesity treatment plan. She is on the  follow the Category 2 plan and is following her eating plan approximately 60 % of the time. She states she is exercising 0 minutes 0 times per week. Emily Osborne reports stress eating due to family issues. She has not felt like cooking and has been reaching for comfort foods.  Her weight is 224 lb (101.6 kg) today and has had a weight gain of 2 pounds over a period of 3 weeks since her last visit. She has lost 2 lbs since starting treatment with [place].  Pre-Diabetes Emily Osborne has a diagnosis of prediabetes based on her elevated HgA1c and was informed this puts her at greater risk of developing diabetes. She is taking metformin currently and continues to work on diet and exercise to decrease risk of diabetes. She denies nausea/vomiting, diarrhea, or hypoglycemia.  Vitamin D deficiency Emily Osborne has a diagnosis of vitamin D deficiency. She is currently taking vit D and denies nausea, vomiting or muscle weakness.  Ref. Range 03/20/2018 10:46  Vitamin D, 25-Hydroxy Latest Ref Range: 30.0 - 100.0 ng/mL 31.0   At risk for diabetes Emily Osborne is at higher than averagerisk for developing diabetes due to her obesity. She currently denies polyuria or polydipsia.   ALLERGIES: Allergies  Allergen Reactions  . Aspirin Other (See Comments)    REACTION: anaphylaxis  . Gentamicin Other (See Comments)    Eye drops turned the sclera bright red  . Ibuprofen Other (See Comments)    REACTION: anaphylaxsis  . Metronidazole Swelling    REACTION: red face/swelling  . Nsaids Other (See Comments)    REACTION: anaphylaxis  . Doxycycline Other (See Comments)    REACTION: severe nausea/vomiting  . Influenza A (H1n1) Monoval Vac Other (See Comments)    REACTION: sick for 3 weeks  . Loratadine Other (See Comments)    Fatigue/weakness  . Periactin  [Cyproheptadine] Other (See Comments)    paranoid   . Ranitidine Hcl Other (See Comments)    REACTION: Lips turned red /peel  . Sulfamethoxazole-Trimethoprim Other (See Comments)    REACTION: face red/peel  . Tramadol Hcl Other (See Comments)    REACTION: paranoid    MEDICATIONS: Current Outpatient Medications on File Prior to Visit  Medication Sig Dispense Refill  . acetaminophen (TYLENOL) 500 MG tablet Take 1,000 mg by mouth every 6 (six) hours as needed for moderate pain.     Marland Kitchen albuterol (PROAIR HFA) 108 (90 Base) MCG/ACT inhaler USE 2 PUFFS EVERY 4 HOURS AS NEEDED FOR COUGH, WHEEZE OR SHORTNESS OF BREATH. 8.5 each 2  . ALPRAZolam (XANAX) 0.25 MG tablet Take 1 tablet (0.25 mg total) by mouth 3 (three) times daily as needed. 30 tablet 2  . atenolol (TENORMIN) 25 MG tablet TAKE 1/2 TABLETS (12.5 MG TOTAL) BY MOUTH DAILY 45 tablet 3  . azelastine (OPTIVAR) 0.05 % ophthalmic solution Place 1 drop into both eyes 2 (two) times daily. (Patient taking differently: Place 1 drop into both eyes as needed. ) 6 mL 12  . Blood Glucose Monitoring Suppl (FREESTYLE FREEDOM) KIT 1 Device by Does not apply route daily. 1 each 0  . dexlansoprazole (DEXILANT) 60 MG capsule Take 1 capsule (60 mg total) by mouth daily. 90 capsule 0  . fexofenadine (ALLEGRA) 180 MG tablet Take 1 tablet (180 mg total) by mouth daily. 90 tablet 3  . Flaxseed,  Linseed, (FLAXSEED OIL PO) Take by mouth. One capsule daily    . fluticasone (FLONASE) 50 MCG/ACT nasal spray PLACE 2 SPRAYS INTO BOTH NOSTRILS DAILY. 16 g 4  . Fluticasone-Salmeterol (ADVAIR DISKUS) 250-50 MCG/DOSE AEPB TAKE 1 PUFF BY MOUTH TWICE A DAY 60 each 2  . FREESTYLE LITE test strip USE TO CHECK BLOOD SUGAR ONCE A DAY 100 each 0  . Iron Combinations (I.L.X. B-12) ELIX Take 1 each by mouth daily as needed (energy). 1 droplet full    . Multiple Vitamins-Minerals (MULTIVITAMIN WITH MINERALS) tablet Take 1 tablet by mouth daily.    . valACYclovir (VALTREX) 1000 MG  tablet Take 1 tablet (1,000 mg total) by mouth 3 (three) times daily. (Patient taking differently: Take 1,000 mg by mouth 3 (three) times daily as needed (3 times daily as needed). As needed) 30 tablet 5   No current facility-administered medications on file prior to visit.     PAST MEDICAL HISTORY: Past Medical History:  Diagnosis Date  . Allergy   . Anxiety   . Asthma    pt has inhaler  . Back pain   . C. difficile colitis   . Cataract    bil eyes  . Chest pain   . Chronic fatigue syndrome   . Clostridium difficile colitis 12/13/2016  . Diverticulitis   . Dyspnea   . Dysrhythmia    palpitations  . Edema   . Elevated blood pressure reading without diagnosis of hypertension    "just elevated when I'm in pain" (12/07/2015)  . Fatty liver   . Fibromyalgia   . Ganglion of joint    right wrist  . GERD (gastroesophageal reflux disease)   . H. pylori infection   . Headache    hx of  . Helicobacter pylori (H. pylori) infection 12/13/2016  . Herpes zoster without mention of complication   . HTN (hypertension)   . Hyperlipidemia   . Hyperthyroidism   . IBS (irritable bowel syndrome)   . Leg edema   . Multiple drug allergies 06/18/2017  . Myalgia   . Nontraumatic rupture of Achilles tendon   . Other acute reactions to stress   . Pain in joint, shoulder region   . Pain in joint, upper arm   . Pain in limb   . Palpitations   . Personal history of other diseases of digestive system   . Pneumonia    once  . PONV (postoperative nausea and vomiting)   . Routine screening for STI (sexually transmitted infection) 12/13/2016  . S/P partial colectomy 06/18/2017  . Thyroid disease    hyperthyroidism  . Vitamin B 12 deficiency     PAST SURGICAL HISTORY: Past Surgical History:  Procedure Laterality Date  . ABDOMINAL HYSTERECTOMY    . ACHILLES TENDON REPAIR Right 2005  . BUNIONECTOMY Bilateral    Bunionectomy 1983  . COLON SURGERY  01/23/2017   6 to 8 inches sigmoid colon  removed  . GANGLION CYST EXCISION Right    wrist  . LEFT HEART CATH AND CORONARY ANGIOGRAPHY N/A 10/03/2016   Procedure: Left Heart Cath and Coronary Angiography;  Surgeon: Burnell Blanks, MD;  Location: [facility];  Service: Cardiovascular;  Laterality: N/A;  . MENISCUS REPAIR Right 04/2014  . TUBAL LIGATION    . wisdomteeth extraction      SOCIAL HISTORY: Social History   Tobacco Use  . Smoking status: Never Smoker  . Smokeless tobacco: Never Used  Substance Use Topics  . Alcohol  use: No    Alcohol/week: 0.0 standard drinks  . Drug use: No    FAMILY HISTORY: Family History  Problem Relation Age of Onset  . Hypertension Father   . Diabetes Father   . Heart disease Father   . Kidney disease Father        Died, 41  . Hyperlipidemia Father   . Leukemia Brother   . Cancer Brother        myleoblastic anemia  . Hyperlipidemia Mother   . Hypertension Mother        Died, 21  . Thyroid disease Mother        Thyroid surgery  . Obesity Mother   . Heart disease Mother   . Pernicious anemia Sister   . Thyroid disease Sister        On thyroid Rx  . Lupus Daughter   . Coronary artery disease Brother   . Hypertension Brother   . Colon cancer Neg Hx   . Esophageal cancer Neg Hx   . Rectal cancer Neg Hx   . Stomach cancer Neg Hx   . Pancreatic cancer Neg Hx   . Prostate cancer Neg Hx     ROS: Review of Systems  Constitutional: Negative for malaise/fatigue.  Gastrointestinal: Negative for diarrhea, nausea and vomiting.  Musculoskeletal:       Negative for muscle weakness  Endo/Heme/Allergies:       Negative for hypoglycemia    PHYSICAL EXAM: Blood pressure 108/70, pulse (!) 58, temperature 97.9 F (36.6 C), height _0  (1.651 m), weight 224 lb (101.6 kg), SpO2 97 %. Body mass index is 37.28 kg/m. Physical Exam  Constitutional: She is oriented to person, place, and time. She appears well-developed and well-nourished.  HENT:  Head: Normocephalic.    Eyes: Pupils are equal, round, and reactive to light.  Neck: Normal range of motion.  Cardiovascular: Normal rate.  Pulmonary/Chest: Effort normal.  Musculoskeletal: Normal range of motion.  Neurological: She is alert and oriented to person, place, and time.  Skin: Skin is warm and dry.  Psychiatric: She has a normal mood and affect. Her behavior is normal.  Vitals reviewed.   RECENT LABS AND TESTS: BMET    Component Value Date/Time   NA 145 06/05/2018 1638   NA 142 03/20/2018 1046   K 4.4 06/05/2018 1638   CL 105 06/05/2018 1638   CO2 29 06/05/2018 1638   GLUCOSE 95 06/05/2018 1638   BUN 16 06/05/2018 1638   BUN 17 03/20/2018 1046   CREATININE 1.01 06/05/2018 1638   CREATININE 0.97 03/23/2016 1501   CALCIUM 9.8 06/05/2018 1638   GFRNONAA 65 03/20/2018 1046   GFRAA 75 03/20/2018 1046   Lab Results  Component Value Date   HGBA1C 6.0 05/13/2018   HGBA1C 5.6 03/20/2018   HGBA1C 5.8 (H) 12/12/2017   HGBA1C 5.5 01/15/2017   HGBA1C 5.9 (H) 12/07/2015   Lab Results  Component Value Date   INSULIN 20.0 03/20/2018   INSULIN 17.6 12/12/2017   CBC    Component Value Date/Time   WBC 5.9 06/05/2018 1638   RBC 4.55 06/05/2018 1638   HGB 13.1 06/05/2018 1638   HGB 13.5 12/12/2017 1102   HCT 40.3 06/05/2018 1638   HCT 41.8 12/12/2017 1102   PLT 258.0 06/05/2018 1638   MCV 88.4 06/05/2018 1638   MCV 89 12/12/2017 1102   MCH 28.6 12/12/2017 1102   MCH 28.9 02/08/2017 0909   MCHC 32.6 06/05/2018 1638   RDW 14.8 06/05/2018  1638   RDW 15.1 12/12/2017 1102   LYMPHSABS 3.0 06/05/2018 1638   LYMPHSABS 2.3 12/12/2017 1102   MONOABS 0.6 06/05/2018 1638   EOSABS 0.2 06/05/2018 1638   EOSABS 0.1 12/12/2017 1102   BASOSABS 0.0 06/05/2018 1638   BASOSABS 0.0 12/12/2017 1102   Iron/TIBC/Ferritin/ %Sat No results found for: IRON, TIBC, FERRITIN, IRONPCTSAT Lipid Panel     Component Value Date/Time   CHOL 197 03/20/2018 1046   TRIG 78 03/20/2018 1046   HDL 57 03/20/2018  1046   CHOLHDL 4 10/11/2017 1027   VLDL 20.6 10/11/2017 1027   LDLCALC 124 (H) 03/20/2018 1046   LDLDIRECT 139.4 12/26/2011 0905   Hepatic Function Panel     Component Value Date/Time   PROT 6.7 06/05/2018 1638   PROT 6.8 03/20/2018 1046   ALBUMIN 4.1 06/05/2018 1638   ALBUMIN 4.4 03/20/2018 1046   AST 17 06/05/2018 1638   ALT 19 06/05/2018 1638   ALKPHOS 59 06/05/2018 1638   BILITOT 0.3 06/05/2018 1638   BILITOT 0.4 03/20/2018 1046   BILIDIR 0.1 09/07/2014 1031      Component Value Date/Time   TSH 0.67 06/05/2018 1638   TSH 0.74 05/13/2018 1513   TSH 0.83 11/13/2017 0847    Ref. Range 03/20/2018 10:46  Vitamin D, 25-Hydroxy Latest Ref Range: 30.0 - 100.0 ng/mL 31.0    ASSESSMENT AND PLAN: Prediabetes - Plan: metFORMIN (GLUCOPHAGE) 500 MG tablet  Vitamin D deficiency - Plan: Vitamin D, Ergocalciferol, (DRISDOL) 1.25 MG (50000 UT) CAPS capsule  At risk for diabetes mellitus  Class 2 severe obesity with serious comorbidity and body mass index (BMI) of 37.0 to 37.9 in adult, unspecified obesity type [facility])  PLAN: Pre-Diabetes Emily Osborne will continue to work on weight loss, exercise, and decreasing simple carbohydrates in her diet to help decrease the risk of diabetes. We dicussed metformin including benefits and risks. She was informed that eating too many simple carbohydrates or too many calories at one sitting increases the likelihood of GI side effects. Emily Osborne agrees to continue metformin 500 mg daily #30 with no refills. Emily Osborne agreed to follow up with [place] as directed to monitor her progress.  Vitamin D Deficiency Emily Osborne was informed that low vitamin D levels contributes to fatigue and are associated with obesity, breast, and colon cancer. She agrees to continue to take prescription Vit D _0 ,000 IU every week #4 with no refills and will follow up for routine testing of vitamin D, at least 2-3 times per year. She was informed of the risk of over-replacement of vitamin D and  agrees to not increase her dose unless she discusses this with [place] first. Agrees to follow up with our clinic as directed.   Diabetes risk counseling Emily Osborne was given extended (15 minutes) diabetes prevention counseling today. She is 57 y.o. female and has risk factors for diabetes including obesity. We discussed intensive lifestyle modifications today with an emphasis on weight loss as well as increasing exercise and decreasing simple carbohydrates in her diet.  Obesity Emily Osborne is currently in the action stage of change. As such, her goal is to continue with weight loss efforts She has agreed to follow the Category 2 plan Emily Osborne has been instructed to work up to a goal of 150 minutes of combined cardio and strengthening exercise per week for weight loss and overall health benefits. We discussed the following Behavioral Modification Strategies today: work on meal planning and easy cooking plans, travel eating strategies, holiday eating strategies.    Emily Osborne  has agreed to follow up with our clinic in 3 weeks. She was informed of the importance of frequent follow up visits to maximize her success with intensive lifestyle modifications for her multiple health conditions.   OBESITY BEHAVIORAL INTERVENTION VISIT  Today's visit was # 10   Starting weight: 226 lb Starting date: 12/12/17 Today's weight : Weight: 224 lb (101.6 kg)  Today's date: 07/24/18 Total lbs lost to date: 2 lb At least 15 minutes were spent on discussing the following behavioral intervention visit.   ASK: We discussed the diagnosis of obesity with Emily Osborne today and Emily Osborne agreed to give [place] permission to discuss obesity behavioral modification therapy today.  ASSESS: Emily Osborne has the diagnosis of obesity and her BMI today is 37.28 Emily Osborne is in the action stage of change   ADVISE: Emily Osborne was educated on the multiple health risks of obesity as well as the benefit of weight loss to improve her health. She was advised  of the need for long term treatment and the importance of lifestyle modifications to improve her current health and to decrease her risk of future health problems.  AGREE: Multiple dietary modification options and treatment options were discussed and  Emily Osborne agreed to follow the recommendations documented in the above note.  ARRANGE: Emily Osborne was educated on the importance of frequent visits to treat obesity as outlined per CMS and USPSTF guidelines and agreed to schedule her next follow up appointment today.  Leary Roca, am acting as transcriptionist for Abby Potash, PA-C I, Abby Potash, PA-C have reviewed above note and agree with its content

## 2018-07-29 ENCOUNTER — Encounter: Payer: Self-pay | Admitting: Family Medicine

## 2018-07-29 ENCOUNTER — Ambulatory Visit: Payer: 59 | Admitting: Family Medicine

## 2018-07-29 VITALS — BP 118/82 | HR 59 | Temp 98.0°F | Resp 16 | Ht 65.0 in

## 2018-07-29 DIAGNOSIS — K219 Gastro-esophageal reflux disease without esophagitis: Secondary | ICD-10-CM

## 2018-07-29 DIAGNOSIS — I10 Essential (primary) hypertension: Secondary | ICD-10-CM | POA: Diagnosis not present

## 2018-07-29 DIAGNOSIS — J45909 Unspecified asthma, uncomplicated: Secondary | ICD-10-CM

## 2018-07-29 DIAGNOSIS — J302 Other seasonal allergic rhinitis: Secondary | ICD-10-CM | POA: Diagnosis not present

## 2018-07-29 DIAGNOSIS — H1010 Acute atopic conjunctivitis, unspecified eye: Secondary | ICD-10-CM | POA: Diagnosis not present

## 2018-07-29 DIAGNOSIS — E559 Vitamin D deficiency, unspecified: Secondary | ICD-10-CM

## 2018-07-29 DIAGNOSIS — F419 Anxiety disorder, unspecified: Secondary | ICD-10-CM | POA: Diagnosis not present

## 2018-07-29 DIAGNOSIS — R7303 Prediabetes: Secondary | ICD-10-CM

## 2018-07-29 DIAGNOSIS — J4520 Mild intermittent asthma, uncomplicated: Secondary | ICD-10-CM

## 2018-07-29 MED ORDER — FLUTICASONE-SALMETEROL 250-50 MCG/DOSE IN AEPB: INHALATION_SPRAY | RESPIRATORY_TRACT | 2 refills | Status: DC

## 2018-07-29 MED ORDER — METFORMIN HCL 500 MG PO TABS: 500.0000 mg | ORAL_TABLET | Freq: Every day | ORAL | 0 refills | Status: DC

## 2018-07-29 MED ORDER — ALBUTEROL SULFATE HFA 108 (90 BASE) MCG/ACT IN AERS: INHALATION_SPRAY | RESPIRATORY_TRACT | 2 refills | Status: DC

## 2018-07-29 MED ORDER — VALACYCLOVIR HCL 1 G PO TABS: 1000.0000 mg | ORAL_TABLET | Freq: Three times a day (TID) | ORAL | 5 refills | Status: DC

## 2018-07-29 MED ORDER — FLUTICASONE PROPIONATE 50 MCG/ACT NA SUSP: NASAL | 4 refills | Status: DC

## 2018-07-29 MED ORDER — ALPRAZOLAM 0.25 MG PO TABS: 0.2500 mg | ORAL_TABLET | Freq: Three times a day (TID) | ORAL | 2 refills | Status: DC | PRN

## 2018-07-29 MED ORDER — VITAMIN D (ERGOCALCIFEROL) 1.25 MG (50000 UNIT) PO CAPS: 50000.0000 [IU] | ORAL_CAPSULE | ORAL | 0 refills | Status: DC

## 2018-07-29 MED ORDER — AZELASTINE HCL 0.05 % OP SOLN: 1.0000 [drp] | Freq: Two times a day (BID) | OPHTHALMIC | 12 refills | Status: DC

## 2018-07-29 MED ORDER — ATENOLOL 25 MG PO TABS: ORAL_TABLET | ORAL | 3 refills | Status: DC

## 2018-07-29 MED ORDER — DEXLANSOPRAZOLE 60 MG PO CPDR: 1.0000 | DELAYED_RELEASE_CAPSULE | Freq: Every day | ORAL | 0 refills | Status: DC

## 2018-07-29 MED ORDER — FEXOFENADINE HCL 180 MG PO TABS: 180.0000 mg | ORAL_TABLET | Freq: Every day | ORAL | 3 refills | Status: DC

## 2018-07-29 NOTE — Progress Notes (Signed)
Patient ID: Emily Osborne, female    DOB: 03-30-1961  Age: 57 y.o. MRN: [id number]    Subjective:  Subjective  HPI Emily Osborne presents for uri symptoms , no fever, + nasal congestion.  She is using her regular meds but not her flonase or astelin.   symtpoms x few days.  It is actually improving  She is also here for f/u and med refills   Review of Systems  Constitutional: Negative for appetite change, chills, diaphoresis, fatigue, fever and unexpected weight change.  HENT: Positive for postnasal drip, rhinorrhea, sneezing and sore throat. Negative for congestion and sinus pressure.   Eyes: Negative for pain, redness and visual disturbance.  Respiratory: Positive for cough. Negative for chest tightness, shortness of breath and wheezing.   Cardiovascular: Negative for chest pain, palpitations and leg swelling.  Endocrine: Negative for cold intolerance, heat intolerance, polydipsia, polyphagia and polyuria.  Genitourinary: Negative for difficulty urinating, dysuria and frequency.  Allergic/Immunologic: Negative for environmental allergies.  Neurological: Negative for dizziness, light-headedness, numbness and headaches.    History Past Medical History:  Diagnosis Date  . Allergy   . Anxiety   . Asthma    pt has inhaler  . Back pain   . C. difficile colitis   . Cataract    bil eyes  . Chest pain   . Chronic fatigue syndrome   . Clostridium difficile colitis 12/13/2016  . Diverticulitis   . Dyspnea   . Dysrhythmia    palpitations  . Edema   . Elevated blood pressure reading without diagnosis of hypertension    "just elevated when I'm in pain" (12/07/2015)  . Fatty liver   . Fibromyalgia   . Ganglion of joint    right wrist  . GERD (gastroesophageal reflux disease)   . H. pylori infection   . Headache    hx of  . Helicobacter pylori (H. pylori) infection 12/13/2016  . Herpes zoster without mention of complication   . HTN (hypertension)   . Hyperlipidemia   .  Hyperthyroidism   . IBS (irritable bowel syndrome)   . Leg edema   . Multiple drug allergies 06/18/2017  . Myalgia   . Nontraumatic rupture of Achilles tendon   . Other acute reactions to stress   . Pain in joint, shoulder region   . Pain in joint, upper arm   . Pain in limb   . Palpitations   . Personal history of other diseases of digestive system   . Pneumonia    once  . PONV (postoperative nausea and vomiting)   . Routine screening for STI (sexually transmitted infection) 12/13/2016  . S/P partial colectomy 06/18/2017  . Thyroid disease    hyperthyroidism  . Vitamin B 12 deficiency     She has a past surgical history that includes Abdominal hysterectomy; Tubal ligation; Bunionectomy (Bilateral); Achilles tendon repair (Right, 2005); Ganglion cyst excision (Right); Meniscus repair (Right, 04/2014); wisdomteeth extraction; LEFT HEART CATH AND CORONARY ANGIOGRAPHY (N/A, 10/03/2016); and Colon surgery (01/23/2017).   Her family history includes Cancer in her brother; Coronary artery disease in her brother; Diabetes in her father; Heart disease in her father and mother; Hyperlipidemia in her father and mother; Hypertension in her brother, father, and mother; Kidney disease in her father; Leukemia in her brother; Lupus in her daughter; Obesity in her mother; Pernicious anemia in her sister; Thyroid disease in her mother and sister.She reports that she has never smoked. She has never used smokeless tobacco. She reports  that she does not drink alcohol or use drugs.  Current Outpatient Medications on File Prior to Visit  Medication Sig Dispense Refill  . acetaminophen (TYLENOL) 500 MG tablet Take 1,000 mg by mouth every 6 (six) hours as needed for moderate pain.     . Blood Glucose Monitoring Suppl (FREESTYLE FREEDOM) KIT 1 Device by Does not apply route daily. 1 each 0  . Flaxseed, Linseed, (FLAXSEED OIL PO) Take by mouth. One capsule daily    . FREESTYLE LITE test strip USE TO CHECK BLOOD  SUGAR ONCE A DAY 100 each 0  . Iron Combinations (I.L.X. B-12) ELIX Take 1 each by mouth daily as needed (energy). 1 droplet full    . Multiple Vitamins-Minerals (MULTIVITAMIN WITH MINERALS) tablet Take 1 tablet by mouth daily.     No current facility-administered medications on file prior to visit.      Objective:  Objective  Physical Exam  Constitutional: She is oriented to person, place, and time. She appears well-developed and well-nourished.  HENT:  Head: Normocephalic and atraumatic.  Right Ear: External ear normal.  Left Ear: External ear normal.  Nose: Mucosal edema present. No rhinorrhea. Right sinus exhibits no maxillary sinus tenderness and no frontal sinus tenderness. Left sinus exhibits no maxillary sinus tenderness and no frontal sinus tenderness.  Mouth/Throat: No oral lesions. Posterior oropharyngeal erythema present. No oropharyngeal exudate.  + PND + errythema  Eyes: Conjunctivae and EOM are normal. Right eye exhibits no discharge. Left eye exhibits no discharge.  Neck: Normal range of motion. Neck supple. No JVD present. Carotid bruit is not present. No thyromegaly present.  Cardiovascular: Normal rate, regular rhythm and normal heart sounds.  No murmur heard. Pulmonary/Chest: Effort normal and breath sounds normal. No respiratory distress. She has no wheezes. She has no rales. She exhibits no tenderness.  Musculoskeletal: She exhibits no edema.  Lymphadenopathy:    She has no cervical adenopathy.  Neurological: She is alert and oriented to person, place, and time.  Psychiatric: She has a normal mood and affect.  Nursing note and vitals reviewed.  BP 118/82 (BP Location: Right Arm, Cuff Size: Large)   Pulse (!) 59   Temp 98 F (36.7 C) (Oral)   Resp 16   Ht 5' 5"  (1.651 m)   SpO2 98%   BMI 37.28 kg/m  Wt Readings from Last 3 Encounters:  07/24/18 224 lb (101.6 kg)  07/22/18 230 lb 0.6 oz (104.3 kg)  07/03/18 222 lb (100.7 kg)     Lab Results    Component Value Date   WBC 5.9 06/05/2018   HGB 13.1 06/05/2018   HCT 40.3 06/05/2018   PLT 258.0 06/05/2018   GLUCOSE 95 06/05/2018   CHOL 197 03/20/2018   TRIG 78 03/20/2018   HDL 57 03/20/2018   LDLDIRECT 139.4 12/26/2011   LDLCALC 124 (H) 03/20/2018   ALT 19 06/05/2018   AST 17 06/05/2018   NA 145 06/05/2018   K 4.4 06/05/2018   CL 105 06/05/2018   CREATININE 1.01 06/05/2018   BUN 16 06/05/2018   CO2 29 06/05/2018   TSH 0.67 06/05/2018   INR 0.93 10/03/2016   HGBA1C 6.0 05/13/2018    Ct Angio Chest W/cm &/or Wo Cm  Result Date: 06/06/2018 CLINICAL DATA:  Shortness of breath.  Left anterior chest pain. EXAM: CT ANGIOGRAPHY CHEST WITH CONTRAST TECHNIQUE: Multidetector CT imaging of the chest was performed using the standard protocol during bolus administration of intravenous contrast. Multiplanar CT image reconstructions and MIPs  were obtained to evaluate the vascular anatomy. CONTRAST:  49m ISOVUE-370 IOPAMIDOL (ISOVUE-370) INJECTION 76% COMPARISON:  Radiography 08/22/2017 FINDINGS: Cardiovascular: Pulmonary arterial opacification is good. There are no pulmonary emboli. No aortic atherosclerosis is evident. No evidence of aortic dissection. No coronary artery calcification is seen. Heart size is normal. Mediastinum/Nodes: Normal Lungs/Pleura: The lungs are clear.  No pleural fluid. Upper Abdomen: Normal Musculoskeletal: Ordinary mild thoracic degenerative changes, primarily of the facet joints. No rib abnormality seen. Review of the MIP images confirms the above findings. IMPRESSION: Normal CT scan of the chest for age. No pulmonary emboli. No other acute or significant chest pathology. Electronically Signed   By: MNelson ChimesM.D.   On: 06/06/2018 16:01   Xr Knee 3 View Left  Result Date: 06/06/2018 AP lateral merchant left knee reviewed.  Joint spaces are maintained in the medial lateral compartment.  Slightly more spurring is noted in the lateral compartment.  Alignment  intact.  No osteo-pia noted.  No fracture dislocation present    Assessment & Plan:  Plan  I am having PRudene Christians"[name]" maintain her acetaminophen, FREESTYLE FREEDOM, I.L.X. B-12, FREESTYLE LITE, (Flaxseed, Linseed, (FLAXSEED OIL PO)), multivitamin with minerals, Vitamin D (Ergocalciferol), valACYclovir, metFORMIN, Fluticasone-Salmeterol, fluticasone, fexofenadine, dexlansoprazole, azelastine, atenolol, ALPRAZolam, and albuterol.  Meds ordered this encounter  Medications  . Vitamin D, Ergocalciferol, (DRISDOL) 1.25 MG (50000 UT) CAPS capsule    Sig: Take 1 capsule (50,000 Units total) by mouth every 7 (seven) days.    Dispense:  4 capsule    Refill:  0  . valACYclovir (VALTREX) 1000 MG tablet    Sig: Take 1 tablet (1,000 mg total) by mouth 3 (three) times daily.    Dispense:  30 tablet    Refill:  5  . metFORMIN (GLUCOPHAGE) 500 MG tablet    Sig: Take 1 tablet (500 mg total) by mouth daily with breakfast.    Dispense:  30 tablet    Refill:  0  . Fluticasone-Salmeterol (ADVAIR DISKUS) 250-50 MCG/DOSE AEPB    Sig: TAKE 1 PUFF BY MOUTH TWICE A DAY    Dispense:  60 each    Refill:  2  . fluticasone (FLONASE) 50 MCG/ACT nasal spray    Sig: PLACE 2 SPRAYS INTO BOTH NOSTRILS DAILY.    Dispense:  16 g    Refill:  4  . fexofenadine (ALLEGRA) 180 MG tablet    Sig: Take 1 tablet (180 mg total) by mouth daily.    Dispense:  90 tablet    Refill:  3  . dexlansoprazole (DEXILANT) 60 MG capsule    Sig: Take 1 capsule (60 mg total) by mouth daily.    Dispense:  90 capsule    Refill:  0  . azelastine (OPTIVAR) 0.05 % ophthalmic solution    Sig: Place 1 drop into both eyes 2 (two) times daily.    Dispense:  6 mL    Refill:  12  . atenolol (TENORMIN) 25 MG tablet    Sig: TAKE 1/2 TABLETS (12.5 MG TOTAL) BY MOUTH DAILY    Dispense:  45 tablet    Refill:  3  . ALPRAZolam (XANAX) 0.25 MG tablet    Sig: Take 1 tablet (0.25 mg total) by mouth 3 (three) times daily as needed.    Dispense:   30 tablet    Refill:  2    This request is for a new prescription for a controlled substance as required by Federal/State law.  . albuterol (PROAIR HFA)  108 (90 Base) MCG/ACT inhaler    Sig: USE 2 PUFFS EVERY 4 HOURS AS NEEDED FOR COUGH, WHEEZE OR SHORTNESS OF BREATH.    Dispense:  8.5 each    Refill:  2    Problem List Items Addressed This Visit      Unprioritized   Anxiety    stable      Relevant Medications   ALPRAZolam (XANAX) 0.25 MG tablet   Asthma    Stable con't meds      Relevant Medications   Fluticasone-Salmeterol (ADVAIR DISKUS) 250-50 MCG/DOSE AEPB   albuterol (PROAIR HFA) 108 (90 Base) MCG/ACT inhaler   GERD   Relevant Medications   dexlansoprazole (DEXILANT) 60 MG capsule    Other Visit Diagnoses    Essential hypertension    -  Primary   Relevant Medications   atenolol (TENORMIN) 25 MG tablet   Vitamin D deficiency       Relevant Medications   Vitamin D, Ergocalciferol, (DRISDOL) 1.25 MG (50000 UT) CAPS capsule   Prediabetes       Relevant Medications   metFORMIN (GLUCOPHAGE) 500 MG tablet   Asthma, chronic, mild intermittent, uncomplicated       Relevant Medications   Fluticasone-Salmeterol (ADVAIR DISKUS) 250-50 MCG/DOSE AEPB   albuterol (PROAIR HFA) 108 (90 Base) MCG/ACT inhaler   Seasonal allergies       Relevant Medications   fluticasone (FLONASE) 50 MCG/ACT nasal spray   fexofenadine (ALLEGRA) 180 MG tablet   Allergic conjunctivitis, unspecified laterality       Relevant Medications   azelastine (OPTIVAR) 0.05 % ophthalmic solution      Follow-up: Return if symptoms worsen or fail to improve.  [name], DO

## 2018-07-29 NOTE — Patient Instructions (Signed)
Upper Respiratory Infection, Adult Most upper respiratory infections (URIs) are caused by a virus. A URI affects the nose, throat, and upper air passages. The most common type of URI is often called "the common cold." Follow these instructions at home:  Take medicines only as told by your doctor.  Gargle warm saltwater or take cough drops to comfort your throat as told by your doctor.  Use a warm mist humidifier or inhale steam from a shower to increase air moisture. This may make it easier to breathe.  Drink enough fluid to keep your pee (urine) clear or pale yellow.  Eat soups and other clear broths.  Have a healthy diet.  Rest as needed.  Go back to work when your fever is gone or your doctor says it is okay. ? You may need to stay home longer to avoid giving your URI to others. ? You can also wear a face mask and wash your hands often to prevent spread of the virus.  Use your inhaler more if you have asthma.  Do not use any tobacco products, including cigarettes, chewing tobacco, or electronic cigarettes. If you need help quitting, ask your doctor. Contact a doctor if:  You are getting worse, not better.  Your symptoms are not helped by medicine.  You have chills.  You are getting more short of breath.  You have brown or red mucus.  You have yellow or brown discharge from your nose.  You have pain in your face, especially when you bend forward.  You have a fever.  You have puffy (swollen) neck glands.  You have pain while swallowing.  You have white areas in the back of your throat. Get help right away if:  You have very bad or constant: ? Headache. ? Ear pain. ? Pain in your forehead, behind your eyes, and over your cheekbones (sinus pain). ? Chest pain.  You have long-lasting (chronic) lung disease and any of the following: ? Wheezing. ? Long-lasting cough. ? Coughing up blood. ? A change in your usual mucus.  You have a stiff neck.  You have  changes in your: ? Vision. ? Hearing. ? Thinking. ? Mood. This information is not intended to replace advice given to you by your health care provider. Make sure you discuss any questions you have with your health care provider. Document Released: 01/23/2008 Document Revised: 04/08/2016 Document Reviewed: 11/11/2013 Elsevier Interactive Patient Education  2018 [employer]  

## 2018-07-30 NOTE — Assessment & Plan Note (Signed)
Stable con't meds 

## 2018-07-30 NOTE — Assessment & Plan Note (Signed)
stable °

## 2018-08-05 ENCOUNTER — Telehealth: Payer: Self-pay | Admitting: Family Medicine

## 2018-08-05 NOTE — Telephone Encounter (Signed)
Copied from [place] [id number]. Topic: General - Other >> Aug 05, 2018 12:09 PM Keene Breath wrote: Reason for CRM: Pharmacy called to speak with the doctor to get clarification on a medication, dexlansoprazole ([place]) 60 MG capsule, for the insurance company.  Please advise and call Briana back at [phone]

## 2018-08-06 ENCOUNTER — Telehealth: Payer: Self-pay | Admitting: Gastroenterology

## 2018-08-06 ENCOUNTER — Encounter (INDEPENDENT_AMBULATORY_CARE_PROVIDER_SITE_OTHER): Payer: Self-pay | Admitting: Orthopedic Surgery

## 2018-08-06 ENCOUNTER — Ambulatory Visit (INDEPENDENT_AMBULATORY_CARE_PROVIDER_SITE_OTHER): Payer: 59 | Admitting: Orthopedic Surgery

## 2018-08-06 ENCOUNTER — Ambulatory Visit (INDEPENDENT_AMBULATORY_CARE_PROVIDER_SITE_OTHER): Payer: 59

## 2018-08-06 DIAGNOSIS — M542 Cervicalgia: Secondary | ICD-10-CM

## 2018-08-06 DIAGNOSIS — M25512 Pain in left shoulder: Secondary | ICD-10-CM

## 2018-08-06 NOTE — Telephone Encounter (Signed)
Ins is requesting a trial of omeprazole, lansoprazole, or pantropazole.

## 2018-08-06 NOTE — Telephone Encounter (Signed)
Ok

## 2018-08-06 NOTE — Telephone Encounter (Signed)
Left message on machine for patient to call back to let us know if she has tried omeprazole, prevacid, or protonix in the past.

## 2018-08-06 NOTE — Telephone Encounter (Signed)
PA has been completed on [email or web address]. We will await response from insurance.

## 2018-08-06 NOTE — Telephone Encounter (Signed)
Patient says she has tried those and they don't work for her. She says she will call her GI doctor to assist her with this instead.

## 2018-08-07 ENCOUNTER — Telehealth: Payer: Self-pay

## 2018-08-07 ENCOUNTER — Other Ambulatory Visit (HOSPITAL_COMMUNITY)
Admission: RE | Admit: 2018-08-07 | Discharge: 2018-08-07 | Disposition: A | Discharge status: Home / Self Care | Payer: 59 | Source: Ambulatory Visit | Attending: Family Medicine | Admitting: Family Medicine

## 2018-08-07 ENCOUNTER — Ambulatory Visit (INDEPENDENT_AMBULATORY_CARE_PROVIDER_SITE_OTHER): Payer: 59 | Admitting: Family Medicine

## 2018-08-07 ENCOUNTER — Encounter: Payer: Self-pay | Admitting: Family Medicine

## 2018-08-07 VITALS — BP 100/68 | HR 68 | Temp 97.7°F | Resp 16 | Ht 65.0 in | Wt 230.4 lb

## 2018-08-07 DIAGNOSIS — E1165 Type 2 diabetes mellitus with hyperglycemia: Secondary | ICD-10-CM | POA: Diagnosis not present

## 2018-08-07 DIAGNOSIS — E1169 Type 2 diabetes mellitus with other specified complication: Secondary | ICD-10-CM | POA: Diagnosis not present

## 2018-08-07 DIAGNOSIS — Z124 Encounter for screening for malignant neoplasm of cervix: Secondary | ICD-10-CM | POA: Diagnosis not present

## 2018-08-07 DIAGNOSIS — E785 Hyperlipidemia, unspecified: Secondary | ICD-10-CM

## 2018-08-07 DIAGNOSIS — R739 Hyperglycemia, unspecified: Secondary | ICD-10-CM

## 2018-08-07 DIAGNOSIS — Z0000 Encounter for general adult medical examination without abnormal findings: Secondary | ICD-10-CM | POA: Diagnosis not present

## 2018-08-07 HISTORY — DX: Type 2 diabetes mellitus with hyperglycemia: E11.65

## 2018-08-07 HISTORY — DX: Hyperglycemia, unspecified: R73.9

## 2018-08-07 LAB — LIPID PANEL
Cholesterol: 192 mg/dL (ref 0–200)
HDL: 53.5 mg/dL (ref >39.00)
LDL Cholesterol: 105 mg/dL — ABNORMAL HIGH (ref 0–99)
NONHDL: 138.52
Total CHOL/HDL Ratio: 4
Triglycerides: 167 mg/dL — ABNORMAL HIGH (ref 0.0–149.0)
VLDL: 33.4 mg/dL (ref 0.0–40.0)

## 2018-08-07 LAB — CBC WITH DIFFERENTIAL/PLATELET
Basophils Absolute: 0 10*3/uL (ref 0.0–0.1)
Basophils Relative: 0.5 % (ref 0.0–3.0)
Eosinophils Absolute: 0.1 10*3/uL (ref 0.0–0.7)
Eosinophils Relative: 2.6 % (ref 0.0–5.0)
HCT: 40.2 % (ref 36.0–46.0)
HEMOGLOBIN: 13.2 g/dL (ref 12.0–15.0)
Lymphocytes Relative: 45.3 % (ref 12.0–46.0)
Lymphs Abs: 2.3 10*3/uL (ref 0.7–4.0)
MCHC: 32.9 g/dL (ref 30.0–36.0)
MCV: 88.6 fl (ref 78.0–100.0)
Monocytes Absolute: 0.5 10*3/uL (ref 0.1–1.0)
Monocytes Relative: 8.9 % (ref 3.0–12.0)
Neutro Abs: 2.2 10*3/uL (ref 1.4–7.7)
Neutrophils Relative %: 42.7 % — ABNORMAL LOW (ref 43.0–77.0)
Platelets: 245 10*3/uL (ref 150.0–400.0)
RBC: 4.54 Mil/uL (ref 3.87–5.11)
RDW: 15 % (ref 11.5–15.5)
WBC: 5.1 10*3/uL (ref 4.0–10.5)

## 2018-08-07 LAB — COMPREHENSIVE METABOLIC PANEL
ALBUMIN: 4.2 g/dL (ref 3.5–5.2)
ALT: 22 U/L (ref 0–35)
AST: 20 U/L (ref 0–37)
Alkaline Phosphatase: 60 U/L (ref 39–117)
BUN: 15 mg/dL (ref 6–23)
CHLORIDE: 104 meq/L (ref 96–112)
CO2: 30 mEq/L (ref 19–32)
Calcium: 9.5 mg/dL (ref 8.4–10.5)
Creatinine, Ser: 0.86 mg/dL (ref 0.40–1.20)
GFR: 86.99 mL/min (ref >60.00)
Glucose, Bld: 92 mg/dL (ref 70–99)
Potassium: 4 mEq/L (ref 3.5–5.1)
Sodium: 142 mEq/L (ref 135–145)
TOTAL PROTEIN: 6.9 g/dL (ref 6.0–8.3)
Total Bilirubin: 0.3 mg/dL (ref 0.2–1.2)

## 2018-08-07 LAB — HEMOGLOBIN A1C: Hgb A1c MFr Bld: 5.8 % (ref 4.6–6.5)

## 2018-08-07 LAB — TSH: TSH: 0.44 u[IU]/mL (ref 0.35–4.50)

## 2018-08-07 MED FILL — valACYclovir HCL 1 GM TABS: 1 | 10 days supply | Qty: 30 | Fill #0

## 2018-08-07 MED FILL — FEXOFENADINE HCL 180 MG TAB: 180 | 30 days supply | Qty: 30 | Fill #0

## 2018-08-07 MED FILL — ALPRAZolam 0.25 MG TABS: 0.25 | 10 days supply | Qty: 30 | Fill #0

## 2018-08-07 MED FILL — ATENOLOL 25 MG TABLET: 25 | 30 days supply | Qty: 45 | Fill #0

## 2018-08-07 MED FILL — FLUTICASONE PROP 50 MCG SPR: 50 | 30 days supply | Qty: 16 | Fill #0

## 2018-08-07 MED FILL — PROAIR HFA 90 MCG INHALER: 108 (90 BAS | 17 days supply | Qty: 9 | Fill #0

## 2018-08-07 MED FILL — AZELASTINE HCL 0.05% DROPS: 0.05 | 30 days supply | Qty: 6 | Fill #0

## 2018-08-07 NOTE — Assessment & Plan Note (Signed)
ghm utd Check labs  See avs  

## 2018-08-07 NOTE — Assessment & Plan Note (Signed)
Encouraged heart healthy diet, increase exercise, avoid trans fats, consider a krill oil cap daily 

## 2018-08-07 NOTE — Assessment & Plan Note (Signed)
Check labs con't meds 

## 2018-08-07 NOTE — Telephone Encounter (Signed)
PA initiated via Covermymeds; KEY: [id number]. Awaiting determination.

## 2018-08-07 NOTE — Progress Notes (Signed)
Subjective:     Emily Osborne is a 57 y.o. female and is here for a comprehensive physical exam. The patient reports no problems.  Social History   Socioeconomic History  . Marital status: Divorced    Spouse name: Not on file  . Number of children: 2  . Years of education: Not on file  . Highest education level: Not on file  Occupational History  . Occupation: Personnel officer  Social Needs  . Financial resource strain: Not on file  . Food insecurity:    Worry: Not on file    Inability: Not on file  . Transportation needs:    Medical: Not on file    Non-medical: Not on file  Tobacco Use  . Smoking status: Never Smoker  . Smokeless tobacco: Never Used  Substance and Sexual Activity  . Alcohol use: No    Alcohol/week: 0.0 standard drinks  . Drug use: No  . Sexual activity: Not Currently    Partners: Male  Lifestyle  . Physical activity:    Days per week: Not on file    Minutes per session: Not on file  . Stress: Not on file  Relationships  . Social connections:    Talks on phone: Not on file    Gets together: Not on file    Attends religious service: Not on file    Active member of club or organization: Not on file    Attends meetings of clubs or organizations: Not on file    Relationship status: Not on file  . Intimate partner violence:    Fear of current or ex partner: Not on file    Emotionally abused: Not on file    Physically abused: Not on file    Forced sexual activity: Not on file  Other Topics Concern  . Not on file  Social History Narrative   Lives with alone.  She has two daughter.   She works as a Programme researcher, broadcasting/film/video at [employer]--- no   Health Maintenance  Topic Date Due  . FOOT EXAM  01/17/1970  . OPHTHALMOLOGY EXAM  01/17/1970  . URINE MICROALBUMIN  01/17/1970  . PAP SMEAR-Modifier  09/07/2017  . COLONOSCOPY  09/29/2018  . HEMOGLOBIN A1C  11/11/2018  . MAMMOGRAM  04/24/2019  . TETANUS/TDAP  07/02/2027  . PNEUMOCOCCAL POLYSACCHARIDE  VACCINE AGE 90-64 HIGH RISK  Completed  . Hepatitis C Screening  Completed  . HIV Screening  Completed    The following portions of the patient's history were reviewed and updated as appropriate: She  has a past medical history of Allergy, Anxiety, Asthma, Back pain, C. difficile colitis, Cataract, Chest pain, Chronic fatigue syndrome, Clostridium difficile colitis (12/13/2016), Diverticulitis, Dyspnea, Dysrhythmia, Edema, Elevated blood pressure reading without diagnosis of hypertension, Fatty liver, Fibromyalgia, Ganglion of joint, GERD (gastroesophageal reflux disease), H. pylori infection, Headache, Helicobacter pylori (H. pylori) infection (12/13/2016), Herpes zoster without mention of complication, HTN (hypertension), Hyperlipidemia, Hyperthyroidism, IBS (irritable bowel syndrome), Leg edema, Multiple drug allergies (06/18/2017), Myalgia, Nontraumatic rupture of Achilles tendon, Other acute reactions to stress, Pain in joint, shoulder region, Pain in joint, upper arm, Pain in limb, Palpitations, Personal history of other diseases of digestive system, Pneumonia, PONV (postoperative nausea and vomiting), Routine screening for STI (sexually transmitted infection) (12/13/2016), S/P partial colectomy (06/18/2017), Thyroid disease, Type 2 diabetes mellitus with hyperglycemia, without long-term current use of insulin ([place]) (08/07/2018), and Vitamin B 12 deficiency. She does not have any pertinent problems on file. She  has a  past surgical history that includes Abdominal hysterectomy; Tubal ligation; Bunionectomy (Bilateral); Achilles tendon repair (Right, 2005); Ganglion cyst excision (Right); Meniscus repair (Right, 04/2014); wisdomteeth extraction; LEFT HEART CATH AND CORONARY ANGIOGRAPHY (N/A, 10/03/2016); and Colon surgery (01/23/2017). Her family history includes Cancer in her brother; Coronary artery disease in her brother; Diabetes in her father; Heart disease in her father and mother; Hyperlipidemia in her  father and mother; Hypertension in her brother, father, and mother; Kidney disease in her father; Leukemia in her brother; Lupus in her daughter; Obesity in her mother; Pernicious anemia in her sister; Thyroid disease in her mother and sister. She  reports that she has never smoked. She has never used smokeless tobacco. She reports that she does not drink alcohol or use drugs. She has a current medication list which includes the following prescription(s): acetaminophen, albuterol, alprazolam, atenolol, azelastine, freestyle freedom, dexlansoprazole, fexofenadine, flaxseed (linseed), fluticasone, fluticasone-salmeterol, freestyle lite, i.l.x. b-12, metformin, multivitamin with minerals, valacyclovir, and vitamin d (ergocalciferol). Current Outpatient Medications on File Prior to Visit  Medication Sig Dispense Refill  . acetaminophen (TYLENOL) 500 MG tablet Take 1,000 mg by mouth every 6 (six) hours as needed for moderate pain.     Marland Kitchen albuterol (PROAIR HFA) 108 (90 Base) MCG/ACT inhaler USE 2 PUFFS EVERY 4 HOURS AS NEEDED FOR COUGH, WHEEZE OR SHORTNESS OF BREATH. 8.5 each 2  . ALPRAZolam (XANAX) 0.25 MG tablet Take 1 tablet (0.25 mg total) by mouth 3 (three) times daily as needed. 30 tablet 2  . atenolol (TENORMIN) 25 MG tablet TAKE 1/2 TABLETS (12.5 MG TOTAL) BY MOUTH DAILY 45 tablet 3  . azelastine (OPTIVAR) 0.05 % ophthalmic solution Place 1 drop into both eyes 2 (two) times daily as needed.    . Blood Glucose Monitoring Suppl (FREESTYLE FREEDOM) KIT 1 Device by Does not apply route daily. 1 each 0  . dexlansoprazole (DEXILANT) 60 MG capsule Take 1 capsule (60 mg total) by mouth daily. 90 capsule 0  . fexofenadine (ALLEGRA) 180 MG tablet Take 1 tablet (180 mg total) by mouth daily. 90 tablet 3  . Flaxseed, Linseed, (FLAXSEED OIL PO) Take by mouth. One capsule daily    . fluticasone (FLONASE) 50 MCG/ACT nasal spray PLACE 2 SPRAYS INTO BOTH NOSTRILS DAILY. 16 g 4  . Fluticasone-Salmeterol (ADVAIR  DISKUS) 250-50 MCG/DOSE AEPB TAKE 1 PUFF BY MOUTH TWICE A DAY 60 each 2  . FREESTYLE LITE test strip USE TO CHECK BLOOD SUGAR ONCE A DAY 100 each 0  . Iron Combinations (I.L.X. B-12) ELIX Take 1 each by mouth daily as needed (energy). 1 droplet full    . metFORMIN (GLUCOPHAGE) 500 MG tablet Take 1 tablet (500 mg total) by mouth daily with breakfast. 30 tablet 0  . Multiple Vitamins-Minerals (MULTIVITAMIN WITH MINERALS) tablet Take 1 tablet by mouth daily.    . valACYclovir (VALTREX) 1000 MG tablet Take 1,000 mg by mouth 3 (three) times daily as needed.    . Vitamin D, Ergocalciferol, (DRISDOL) 1.25 MG (50000 UT) CAPS capsule Take 1 capsule (50,000 Units total) by mouth every 7 (seven) days. 4 capsule 0   No current facility-administered medications on file prior to visit.    She is allergic to aspirin; gentamicin; ibuprofen; metronidazole; nsaids; doxycycline; influenza a (h1n1) monoval vac; loratadine; periactin [cyproheptadine]; ranitidine hcl; sulfamethoxazole-trimethoprim; and tramadol hcl..  Review of Systems Review of Systems  Constitutional: Negative for activity change, appetite change and fatigue.  HENT: Negative for hearing loss, congestion, tinnitus and ear discharge.  dentist q104m  Eyes: Negative for visual disturbance (see optho q1y -- vision corrected to 20/20 with glasses).  Respiratory: Negative for cough, chest tightness and shortness of breath.   Cardiovascular: Negative for chest pain, palpitations and leg swelling.  Gastrointestinal: Negative for abdominal pain, diarrhea, constipation and abdominal distention.  Genitourinary: Negative for urgency, frequency, decreased urine volume and difficulty urinating.  Musculoskeletal: Negative for back pain, arthralgias and gait problem.  Skin: Negative for color change, pallor and rash.  Neurological: Negative for dizziness, light-headedness, numbness and headaches.  Hematological: Negative for adenopathy. Does not bruise/bleed  easily.  Psychiatric/Behavioral: Negative for suicidal ideas, confusion, sleep disturbance, self-injury, dysphoric mood, decreased concentration and agitation.       Objective:    BP 100/68 (BP Location: Right Arm, Cuff Size: Large)   Pulse 68   Temp 97.7 F (36.5 C) (Oral)   Resp 16   Ht 5' 5"  (1.651 m)   Wt 230 lb 6.4 oz (104.5 kg)   SpO2 97%   BMI 38.34 kg/m  General appearance: alert, cooperative, appears stated age and no distress Head: Normocephalic, without obvious abnormality, atraumatic Eyes: conjunctivae/corneas clear. PERRL, EOM's intact. Fundi benign. Ears: normal TM's and external ear canals both ears Nose: Nares normal. Septum midline. Mucosa normal. No drainage or sinus tenderness. Throat: lips, mucosa, and tongue normal; teeth and gums normal Neck: no adenopathy, no carotid bruit, no JVD, supple, symmetrical, trachea midline and thyroid not enlarged, symmetric, no tenderness/mass/nodules Back: symmetric, no curvature. ROM normal. No CVA tenderness. Lungs: clear to auscultation bilaterally Breasts: normal appearance, no masses or tenderness Heart: regular rate and rhythm, S1, S2 normal, no murmur, click, rub or gallop Abdomen: soft, non-tender; bowel sounds normal; no masses,  no organomegaly Pelvic: cervix normal in appearance, external genitalia normal, no adnexal masses or tenderness, no cervical motion tenderness, rectovaginal septum normal, uterus normal size, shape, and consistency, vagina normal without discharge and pap done, rectal heme neg brown stool Extremities: extremities normal, atraumatic, no cyanosis or edema Pulses: 2+ and symmetric Skin: Skin color, texture, turgor normal. No rashes or lesions Lymph nodes: Cervical, supraclavicular, and axillary nodes normal. Neurologic: Alert and oriented X 3, normal strength and tone. Normal symmetric reflexes. Normal coordination and gait    Assessment:    Healthy female exam.      Plan:    ghm  utd Check labs  See After Visit Summary for Counseling Recommendations    1. Preventative health care ghm utd Check labs - CBC with Differential/Platelet - Comprehensive metabolic panel - TSH - Lipid panel  2. Cervical cancer screening  - Cytology - PAP( [place])  3. Hyperlipidemia associated with type 2 diabetes mellitus ([place]) Encouraged heart healthy diet, increase exercise, avoid trans fats, consider a krill oil cap daily - Hemoglobin A1c  4. Hyperlipidemia LDL goal <100   5. Hyperlipidemia, unspecified hyperlipidemia type   6. Type 2 diabetes mellitus with hyperglycemia, without long-term current use of insulin ([facility])

## 2018-08-07 NOTE — Patient Instructions (Signed)
Preventive Care 40-64 Years, Female Preventive care refers to lifestyle choices and visits with your health care provider that can promote health and wellness. What does preventive care include?   A yearly physical exam. This is also called an annual well check.  Dental exams once or twice a year.  Routine eye exams. Ask your health care provider how often you should have your eyes checked.  Personal lifestyle choices, including: ? Daily care of your teeth and gums. ? Regular physical activity. ? Eating a healthy diet. ? Avoiding tobacco and drug use. ? Limiting alcohol use. ? Practicing safe sex. ? Taking low-dose aspirin daily starting at age 50. ? Taking vitamin and mineral supplements as recommended by your health care provider. What happens during an annual well check? The services and screenings done by your health care provider during your annual well check will depend on your age, overall health, lifestyle risk factors, and family history of disease. Counseling Your health care provider may ask you questions about your:  Alcohol use.  Tobacco use.  Drug use.  Emotional well-being.  Home and relationship well-being.  Sexual activity.  Eating habits.  Work and work environment.  Method of birth control.  Menstrual cycle.  Pregnancy history. Screening You may have the following tests or measurements:  Height, weight, and BMI.  Blood pressure.  Lipid and cholesterol levels. These may be checked every 5 years, or more frequently if you are over 50 years old.  Skin check.  Lung cancer screening. You may have this screening every year starting at age 55 if you have a 30-pack-year history of smoking and currently smoke or have quit within the past 15 years.  Colorectal cancer screening. All adults should have this screening starting at age 50 and continuing until age 75. Your health care provider may recommend screening at age 45. You will have tests every  1-10 years, depending on your results and the type of screening test. People at increased risk should start screening at an earlier age. Screening tests may include: ? Guaiac-based fecal occult blood testing. ? Fecal immunochemical test (FIT). ? Stool DNA test. ? Virtual colonoscopy. ? Sigmoidoscopy. During this test, a flexible tube with a tiny camera (sigmoidoscope) is used to examine your rectum and lower colon. The sigmoidoscope is inserted through your anus into your rectum and lower colon. ? Colonoscopy. During this test, a long, thin, flexible tube with a tiny camera (colonoscope) is used to examine your entire colon and rectum.  Hepatitis C blood test.  Hepatitis B blood test.  Sexually transmitted disease (STD) testing.  Diabetes screening. This is done by checking your blood sugar (glucose) after you have not eaten for a while (fasting). You may have this done every 1-3 years.  Mammogram. This may be done every 1-2 years. Talk to your health care provider about when you should start having regular mammograms. This may depend on whether you have a family history of breast cancer.  BRCA-related cancer screening. This may be done if you have a family history of breast, ovarian, tubal, or peritoneal cancers.  Pelvic exam and Pap test. This may be done every 3 years starting at age 21. Starting at age 30, this may be done every 5 years if you have a Pap test in combination with an HPV test.  Bone density scan. This is done to screen for osteoporosis. You may have this scan if you are at high risk for osteoporosis. Discuss your test results, treatment options,   and if necessary, the need for more tests with your health care provider. Vaccines Your health care provider may recommend certain vaccines, such as:  Influenza vaccine. This is recommended every year.  Tetanus, diphtheria, and acellular pertussis (Tdap, Td) vaccine. You may need a Td booster every 10 years.  Varicella  vaccine. You may need this if you have not been vaccinated.  Zoster vaccine. You may need this after age 38.  Measles, mumps, and rubella (MMR) vaccine. You may need at least one dose of MMR if you were born in 1957 or later. You may also need a second dose.  Pneumococcal 13-valent conjugate (PCV13) vaccine. You may need this if you have certain conditions and were not previously vaccinated.  Pneumococcal polysaccharide (PPSV23) vaccine. You may need one or two doses if you smoke cigarettes or if you have certain conditions.  Meningococcal vaccine. You may need this if you have certain conditions.  Hepatitis A vaccine. You may need this if you have certain conditions or if you travel or work in places where you may be exposed to hepatitis A.  Hepatitis B vaccine. You may need this if you have certain conditions or if you travel or work in places where you may be exposed to hepatitis B.  Haemophilus influenzae type b (Hib) vaccine. You may need this if you have certain conditions. Talk to your health care provider about which screenings and vaccines you need and how often you need them. This information is not intended to replace advice given to you by your health care provider. Make sure you discuss any questions you have with your health care provider. Document Released: 09/02/2015 Document Revised: 09/26/2017 Document Reviewed: 06/07/2015 Elsevier Interactive Patient Education  2019 [employer].

## 2018-08-08 NOTE — Telephone Encounter (Signed)
PA approved. Effective 08/07/2018 to 08/07/2019.

## 2018-08-10 ENCOUNTER — Encounter (INDEPENDENT_AMBULATORY_CARE_PROVIDER_SITE_OTHER): Payer: Self-pay | Admitting: Orthopedic Surgery

## 2018-08-10 NOTE — Progress Notes (Signed)
Office Visit Note   Patient: Emily Osborne           Date of Birth: [birth year]           MRN: [id number] Visit Date: 08/06/2018 Requested by: [address] [name], [place] [address] [place], [place] [address] PCP: Carollee Herter, Alferd Apa, DO  Subjective: Chief Complaint  Patient presents with  . Left Shoulder - Pain  . Neck - Pain    HPI: Emily Osborne is a patient with left shoulder and neck pain.  Denies any history of injury.  Been going on for 2 months for the last 3 weeks it has been worse.  Bothers her on a daily basis and affects her use of the left arm.  She states that she has numbness radiating from the neck down into the shoulder and into the hand dorsal aspect.  Cardiac work-up in progress.  She definitely feels better when she puts her arm overhead.  She states that when she rotates her head to the left she has reproduction of symptoms particularly in the trapezial region.  She uses Tylenol and heat for symptoms.  She is a Futures trader.              ROS: All systems reviewed are negative as they relate to the chief complaint within the history of present illness.  Patient denies  fevers or chills.   Assessment & Plan: Visit Diagnoses:  1. Left shoulder pain, unspecified chronicity   2. Neck pain   3. Cervicalgia     Plan: Impression is left shoulder pain and neck pain which looks like radiculopathy at this time.  She does have some mild arthritis on radiographs of the shoulder but her exam of the shoulder is pretty benign in terms of range of motion and rotator cuff strength.  Does have paresthesias and this looks like it is a left-sided radiculopathy.  Been going on for 2 months with failure of conservative management.  Affecting her daily.  Plan MRI scan cervical spine to evaluate left-sided radiculopathy.  Follow-up after that.  Follow-Up Instructions: Return for after MRI.   Orders:  Orders Placed This Encounter  Procedures  . XR Shoulder Left  .  XR Cervical Spine 2 or 3 views  . MR Cervical Spine w/o contrast   No orders of the defined types were placed in this encounter.     Procedures: No procedures performed   Clinical Data: No additional findings.  Objective: Vital Signs: There were no vitals taken for this visit.  Physical Exam:   Constitutional: Patient appears well-developed HEENT:  Head: Normocephalic Eyes:EOM are normal Neck: Normal range of motion Cardiovascular: Normal rate Pulmonary/chest: Effort normal Neurologic: Patient is alert Skin: Skin is warm Psychiatric: Patient has normal mood and affect    Ortho Exam: Ortho exam demonstrates some reproduction of symptoms with rotation of the head to the left.  Patient has 5 out of 5 grip EPL FPL interosseous wrist flexion extension bicep triceps and deltoid strength.  Reflex symmetric bilateral biceps and triceps.  Radial pulse intact bilaterally.  Left shoulder demonstrates full active and passive range of motion with excellent rotator cuff strength bilaterally and no coarse grinding or crepitus in that left shoulder with active or passive range of motion.  Rotator cuff instruct intact to infraspinatus supraspinatus and subscap strength testing.  Does have some paresthesias in the C6 distribution.  Specialty Comments:  No specialty comments available.  Imaging: No  results found.   PMFS History: Patient Active Problem List   Diagnosis Date Noted  . Preventative health care 08/07/2018  . Type 2 diabetes mellitus with hyperglycemia, without long-term current use of insulin ([place]) 08/07/2018  . Anxiety 10/11/2017  . Hyperlipidemia LDL goal <100 10/11/2017  . S/P partial colectomy 06/18/2017  . Multiple drug allergies 06/18/2017  . Colitis 02/05/2017  . Diarrhea 02/05/2017  . Diverticular disease 01/23/2017  . Helicobacter pylori (H. pylori) infection 12/13/2016  . Routine screening for STI (sexually transmitted infection) 12/13/2016  . Clostridium  difficile colitis 12/13/2016  . Diverticulitis 10/10/2016  . Lower abdominal pain 09/29/2016  . Palpitations 02/06/2016  . Mild diastolic dysfunction 38/18/2993  . Graves disease 01/02/2016  . Acute diverticulitis 12/07/2015  . Acute bacterial sinusitis 10/04/2015  . History of colonic polyps 11/15/2014  . Left shoulder pain 09/28/2013  . Left-sided weakness 09/16/2013  . Obesity (BMI 30-39.9) 09/02/2013  . Myalgia 06/21/2011  . POSTMENOPAUSAL STATUS 09/07/2010  . PAIN IN JOINT, MULTIPLE SITES 12/27/2009  . LOW BACK PAIN, CHRONIC 12/27/2009  . FATIGUE 12/27/2009  . VAGINITIS 11/22/2009  . DYSPEPSIA&OTHER [facility] DISORDERS FUNCTION STOMACH 09/01/2009  . NAUSEA 08/03/2009  . FLATULENCE-GAS-BLOATING 08/03/2009  . ABDOMINAL PAIN RIGHT UPPER QUADRANT 07/06/2009  . SUPRAPUBIC PAIN 07/06/2009  . GANGLION CYST, HX OF 07/06/2009  . BACK PAIN 05/04/2009  . Hyperlipidemia 09/16/2008  . UNSPECIFIED MYALGIA AND MYOSITIS 09/16/2008  . Precordial pain 07/27/2008  . GERD 06/08/2008  . HYPERGLYCEMIA, FASTING 06/08/2008  . HELICOBACTER PYLORI GASTRITIS, HX OF 04/05/2008  . OTHER ACUTE REACTIONS TO STRESS 03/16/2008  . ABDOMINAL PAIN, EPIGASTRIC 03/16/2008  . SHOULDER PAIN, RIGHT 10/01/2007  . ELBOW PAIN, RIGHT 10/01/2007  . Pain in limb 04/14/2007  . DEPENDENT EDEMA, RIGHT LEG 04/14/2007  . SHINGLES 04/08/2007  . Asthma 03/18/2007  . GANGLION CYST, WRIST, RIGHT 03/18/2007  . BUNIONECTOMY, HX OF 03/18/2007   Past Medical History:  Diagnosis Date  . Allergy   . Anxiety   . Asthma    pt has inhaler  . Back pain   . C. difficile colitis   . Cataract    bil eyes  . Chest pain   . Chronic fatigue syndrome   . Clostridium difficile colitis 12/13/2016  . Diverticulitis   . Dyspnea   . Dysrhythmia    palpitations  . Edema   . Elevated blood pressure reading without diagnosis of hypertension    "just elevated when I'm in pain" (12/07/2015)  . Fatty liver   . Fibromyalgia   . Ganglion  of joint    right wrist  . GERD (gastroesophageal reflux disease)   . H. pylori infection   . Headache    hx of  . Helicobacter pylori (H. pylori) infection 12/13/2016  . Herpes zoster without mention of complication   . HTN (hypertension)   . Hyperlipidemia   . Hyperthyroidism   . IBS (irritable bowel syndrome)   . Leg edema   . Multiple drug allergies 06/18/2017  . Myalgia   . Nontraumatic rupture of Achilles tendon   . Other acute reactions to stress   . Pain in joint, shoulder region   . Pain in joint, upper arm   . Pain in limb   . Palpitations   . Personal history of other diseases of digestive system   . Pneumonia    once  . PONV (postoperative nausea and vomiting)   . Routine screening for STI (sexually transmitted infection) 12/13/2016  . S/P partial colectomy  06/18/2017  . Thyroid disease    hyperthyroidism  . Type 2 diabetes mellitus with hyperglycemia, without long-term current use of insulin ([place]) 08/07/2018  . Vitamin B 12 deficiency     Family History  Problem Relation Age of Onset  . Hypertension Father   . Diabetes Father   . Heart disease Father   . Kidney disease Father        Died, 11  . Hyperlipidemia Father   . Leukemia Brother   . Cancer Brother        myleoblastic anemia  . Hyperlipidemia Mother   . Hypertension Mother        Died, 71  . Thyroid disease Mother        Thyroid surgery  . Obesity Mother   . Heart disease Mother   . Pernicious anemia Sister   . Thyroid disease Sister        On thyroid Rx  . Lupus Daughter   . Coronary artery disease Brother   . Hypertension Brother   . Colon cancer Neg Hx   . Esophageal cancer Neg Hx   . Rectal cancer Neg Hx   . Stomach cancer Neg Hx   . Pancreatic cancer Neg Hx   . Prostate cancer Neg Hx     Past Surgical History:  Procedure Laterality Date  . ABDOMINAL HYSTERECTOMY    . ACHILLES TENDON REPAIR Right 2005  . BUNIONECTOMY Bilateral    Bunionectomy 1983  . COLON SURGERY  01/23/2017     6 to 8 inches sigmoid colon removed  . GANGLION CYST EXCISION Right    wrist  . LEFT HEART CATH AND CORONARY ANGIOGRAPHY N/A 10/03/2016   Procedure: Left Heart Cath and Coronary Angiography;  Surgeon: Burnell Blanks, MD;  Location: [facility];  Service: Cardiovascular;  Laterality: N/A;  . MENISCUS REPAIR Right 04/2014  . TUBAL LIGATION    . wisdomteeth extraction     Social History   Occupational History  . Occupation: Nurse/teacher  Tobacco Use  . Smoking status: Never Smoker  . Smokeless tobacco: Never Used  Substance and Sexual Activity  . Alcohol use: No    Alcohol/week: 0.0 standard drinks  . Drug use: No  . Sexual activity: Not Currently    Partners: Male

## 2018-08-11 ENCOUNTER — Other Ambulatory Visit: Payer: Self-pay | Admitting: Family Medicine

## 2018-08-11 DIAGNOSIS — N760 Acute vaginitis: Secondary | ICD-10-CM

## 2018-08-11 MED ORDER — FLUCONAZOLE 150 MG PO TABS: ORAL_TABLET | ORAL | 0 refills | Status: DC

## 2018-08-11 MED FILL — FLUCONAZOLE 150 MG TABS: 150 | 6 days supply | Qty: 2 | Fill #0

## 2018-08-12 LAB — CYTOLOGY - PAP
Diagnosis: NEGATIVE
HPV (WINDOPATH): NOT DETECTED

## 2018-08-14 ENCOUNTER — Other Ambulatory Visit: Payer: Self-pay | Admitting: *Deleted

## 2018-08-14 DIAGNOSIS — E785 Hyperlipidemia, unspecified: Principal | ICD-10-CM

## 2018-08-14 DIAGNOSIS — E1169 Type 2 diabetes mellitus with other specified complication: Secondary | ICD-10-CM

## 2018-08-14 DIAGNOSIS — E1165 Type 2 diabetes mellitus with hyperglycemia: Secondary | ICD-10-CM

## 2018-08-14 DIAGNOSIS — I10 Essential (primary) hypertension: Secondary | ICD-10-CM

## 2018-08-17 ENCOUNTER — Ambulatory Visit
Admission: RE | Admit: 2018-08-17 | Discharge: 2018-08-17 | Disposition: A | Discharge status: Home / Self Care | Payer: 59 | Source: Ambulatory Visit | Attending: Orthopedic Surgery | Admitting: Orthopedic Surgery

## 2018-08-17 DIAGNOSIS — M542 Cervicalgia: Secondary | ICD-10-CM

## 2018-08-17 DIAGNOSIS — M50123 Cervical disc disorder at C6-C7 level with radiculopathy: Secondary | ICD-10-CM | POA: Diagnosis not present

## 2018-08-21 ENCOUNTER — Ambulatory Visit (INDEPENDENT_AMBULATORY_CARE_PROVIDER_SITE_OTHER): Payer: 59 | Admitting: Physician Assistant

## 2018-08-21 ENCOUNTER — Encounter (INDEPENDENT_AMBULATORY_CARE_PROVIDER_SITE_OTHER): Payer: Self-pay

## 2018-08-26 ENCOUNTER — Encounter (INDEPENDENT_AMBULATORY_CARE_PROVIDER_SITE_OTHER): Payer: Self-pay | Admitting: Physician Assistant

## 2018-08-27 ENCOUNTER — Encounter (INDEPENDENT_AMBULATORY_CARE_PROVIDER_SITE_OTHER): Payer: Self-pay | Admitting: Orthopedic Surgery

## 2018-08-27 ENCOUNTER — Ambulatory Visit (INDEPENDENT_AMBULATORY_CARE_PROVIDER_SITE_OTHER): Payer: 59 | Admitting: Orthopedic Surgery

## 2018-08-27 ENCOUNTER — Other Ambulatory Visit (INDEPENDENT_AMBULATORY_CARE_PROVIDER_SITE_OTHER): Payer: Self-pay

## 2018-08-27 DIAGNOSIS — M542 Cervicalgia: Secondary | ICD-10-CM | POA: Diagnosis not present

## 2018-08-27 DIAGNOSIS — R7303 Prediabetes: Secondary | ICD-10-CM

## 2018-08-27 DIAGNOSIS — E559 Vitamin D deficiency, unspecified: Secondary | ICD-10-CM

## 2018-08-27 MED ORDER — LIDOCAINE 5 % EX PTCH: MEDICATED_PATCH | CUTANEOUS | 0 refills | Status: DC

## 2018-08-27 MED ORDER — METFORMIN HCL 500 MG PO TABS: 500.0000 mg | ORAL_TABLET | Freq: Every day | ORAL | 0 refills | Status: DC

## 2018-08-27 MED ORDER — VITAMIN D (ERGOCALCIFEROL) 1.25 MG (50000 UNIT) PO CAPS: 50000.0000 [IU] | ORAL_CAPSULE | ORAL | 0 refills | Status: DC

## 2018-08-27 MED FILL — LIDOCAINE PATCH 5%: 5 | 30 days supply | Qty: 30 | Fill #0

## 2018-08-27 MED FILL — VIT D2 1.25 MG (50,000 UNIT: 1.25 MG | 14 days supply | Qty: 2 | Fill #0

## 2018-08-27 MED FILL — metFORMIN HCL 500 MG TABS: 500 | 14 days supply | Qty: 14 | Fill #0

## 2018-08-27 NOTE — Progress Notes (Signed)
Office Visit Note   Patient: Emily Osborne           Date of Birth: [birth year]           MRN: [id number] Visit Date: 08/27/2018 Requested by: [address] [place], [place] [address] [place], [place] [address] PCP: Carollee Herter, Alferd Apa, DO  Subjective: Chief Complaint  Patient presents with  . Results    MRI cervical spine    HPI: Emily Osborne is a patient with neck pain and left shoulder and arm pain.  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  She has some milder right-sided foraminal stenosis at C3-4.  She is been doing okay with Tylenol.  She has used Lidoderm patch in the past with some relief.  All symptoms are on the left.              ROS: All systems reviewed are negative as they relate to the chief complaint within the history of present illness.  Patient denies  fevers or chills.   Assessment & Plan: Visit Diagnoses:  1. 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  This does not look like a surgical problem.  I do think she could do well with an injection.  She is going to consider that but in the meantime working to send her to physical therapy for cervical traction and use Lidoderm patch.  If her symptoms become untenable then injection would be a good neck step.  She will call me to set that up.  Follow-up as needed  Follow-Up Instructions: Return if symptoms worsen or fail to improve.   Orders:  No orders of the defined types were placed in this encounter.  Meds ordered this encounter  Medications  . lidocaine (LIDODERM) 5 %    Sig: Apply to affected area QD PRN for a Week    Dispense:  30 patch    Refill:  0      Procedures: No procedures performed   Clinical Data: No additional findings.  Objective: Vital Signs: There were no vitals taken for this visit.  Physical Exam:   Constitutional: Patient appears well-developed HEENT:  Head:  Normocephalic Eyes:EOM are normal Neck: Normal range of motion Cardiovascular: Normal rate Pulmonary/chest: Effort normal Neurologic: Patient is alert Skin: Skin is warm Psychiatric: Patient has normal mood and affect    Ortho Exam: Ortho exam demonstrates full active and passive range of motion of the arms and shoulders.  Neck range of motion also pretty reasonable.  She has normal gait alignment.  Good grip strength in the hands.  Rest of her exam is unchanged.  Specialty Comments:  No specialty comments available.  Imaging: No results found.   PMFS History: Patient Active Problem List   Diagnosis Date Noted  . Preventative health care 08/07/2018  . Type 2 diabetes mellitus with hyperglycemia, without long-term current use of insulin ([place]) 08/07/2018  . Anxiety 10/11/2017  . Hyperlipidemia LDL goal <100 10/11/2017  . S/P partial colectomy 06/18/2017  . Multiple drug allergies 06/18/2017  . Colitis 02/05/2017  . Diarrhea 02/05/2017  . Diverticular disease 01/23/2017  . Helicobacter pylori (H. pylori) infection 12/13/2016  . Routine screening for STI (sexually transmitted infection) 12/13/2016  . Clostridium difficile colitis 12/13/2016  . Diverticulitis 10/10/2016  . Lower abdominal pain 09/29/2016  . Palpitations 02/06/2016  . Mild diastolic dysfunction  02/06/2016  . Graves disease 01/02/2016  . Acute diverticulitis 12/07/2015  . Acute bacterial sinusitis 10/04/2015  . History of colonic polyps 11/15/2014  . Left shoulder pain 09/28/2013  . Left-sided weakness 09/16/2013  . Obesity (BMI 30-39.9) 09/02/2013  . Myalgia 06/21/2011  . POSTMENOPAUSAL STATUS 09/07/2010  . PAIN IN JOINT, MULTIPLE SITES 12/27/2009  . LOW BACK PAIN, CHRONIC 12/27/2009  . FATIGUE 12/27/2009  . VAGINITIS 11/22/2009  . DYSPEPSIA&OTHER [facility] DISORDERS FUNCTION STOMACH 09/01/2009  . NAUSEA 08/03/2009  . FLATULENCE-GAS-BLOATING 08/03/2009  . ABDOMINAL PAIN RIGHT UPPER QUADRANT 07/06/2009  .  SUPRAPUBIC PAIN 07/06/2009  . GANGLION CYST, HX OF 07/06/2009  . BACK PAIN 05/04/2009  . Hyperlipidemia 09/16/2008  . UNSPECIFIED MYALGIA AND MYOSITIS 09/16/2008  . Precordial pain 07/27/2008  . GERD 06/08/2008  . HYPERGLYCEMIA, FASTING 06/08/2008  . HELICOBACTER PYLORI GASTRITIS, HX OF 04/05/2008  . OTHER ACUTE REACTIONS TO STRESS 03/16/2008  . ABDOMINAL PAIN, EPIGASTRIC 03/16/2008  . SHOULDER PAIN, RIGHT 10/01/2007  . ELBOW PAIN, RIGHT 10/01/2007  . Pain in limb 04/14/2007  . DEPENDENT EDEMA, RIGHT LEG 04/14/2007  . SHINGLES 04/08/2007  . Asthma 03/18/2007  . GANGLION CYST, WRIST, RIGHT 03/18/2007  . BUNIONECTOMY, HX OF 03/18/2007   Past Medical History:  Diagnosis Date  . Allergy   . Anxiety   . Asthma    pt has inhaler  . Back pain   . C. difficile colitis   . Cataract    bil eyes  . Chest pain   . Chronic fatigue syndrome   . Clostridium difficile colitis 12/13/2016  . Diverticulitis   . Dyspnea   . Dysrhythmia    palpitations  . Edema   . Elevated blood pressure reading without diagnosis of hypertension    "just elevated when I'm in pain" (12/07/2015)  . Fatty liver   . Fibromyalgia   . Ganglion of joint    right wrist  . GERD (gastroesophageal reflux disease)   . H. pylori infection   . Headache    hx of  . Helicobacter pylori (H. pylori) infection 12/13/2016  . Herpes zoster without mention of complication   . HTN (hypertension)   . Hyperlipidemia   . Hyperthyroidism   . IBS (irritable bowel syndrome)   . Leg edema   . Multiple drug allergies 06/18/2017  . Myalgia   . Nontraumatic rupture of Achilles tendon   . Other acute reactions to stress   . Pain in joint, shoulder region   . Pain in joint, upper arm   . Pain in limb   . Palpitations   . Personal history of other diseases of digestive system   . Pneumonia    once  . PONV (postoperative nausea and vomiting)   . Routine screening for STI (sexually transmitted infection) 12/13/2016  . S/P  partial colectomy 06/18/2017  . Thyroid disease    hyperthyroidism  . Type 2 diabetes mellitus with hyperglycemia, without long-term current use of insulin ([place]) 08/07/2018  . Vitamin B 12 deficiency     Family History  Problem Relation Age of Onset  . Hypertension Father   . Diabetes Father   . Heart disease Father   . Kidney disease Father        Died, 41  . Hyperlipidemia Father   . Leukemia Brother   . Cancer Brother        myleoblastic anemia  . Hyperlipidemia Mother   . Hypertension Mother        Died, 46  .  Thyroid disease Mother        Thyroid surgery  . Obesity Mother   . Heart disease Mother   . Pernicious anemia Sister   . Thyroid disease Sister        On thyroid Rx  . Lupus Daughter   . Coronary artery disease Brother   . Hypertension Brother   . Colon cancer Neg Hx   . Esophageal cancer Neg Hx   . Rectal cancer Neg Hx   . Stomach cancer Neg Hx   . Pancreatic cancer Neg Hx   . Prostate cancer Neg Hx     Past Surgical History:  Procedure Laterality Date  . ABDOMINAL HYSTERECTOMY    . ACHILLES TENDON REPAIR Right 2005  . BUNIONECTOMY Bilateral    Bunionectomy 1983  . COLON SURGERY  01/23/2017   6 to 8 inches sigmoid colon removed  . GANGLION CYST EXCISION Right    wrist  . LEFT HEART CATH AND CORONARY ANGIOGRAPHY N/A 10/03/2016   Procedure: Left Heart Cath and Coronary Angiography;  Surgeon: Burnell Blanks, MD;  Location: [facility];  Service: Cardiovascular;  Laterality: N/A;  . MENISCUS REPAIR Right 04/2014  . TUBAL LIGATION    . wisdomteeth extraction     Social History   Occupational History  . Occupation: Nurse/teacher  Tobacco Use  . Smoking status: Never Smoker  . Smokeless tobacco: Never Used  Substance and Sexual Activity  . Alcohol use: No    Alcohol/week: 0.0 standard drinks  . Drug use: No  . Sexual activity: Not Currently    Partners: Male

## 2018-09-02 ENCOUNTER — Ambulatory Visit (INDEPENDENT_AMBULATORY_CARE_PROVIDER_SITE_OTHER): Payer: 59

## 2018-09-02 DIAGNOSIS — R002 Palpitations: Secondary | ICD-10-CM | POA: Diagnosis not present

## 2018-09-04 ENCOUNTER — Telehealth: Payer: Self-pay | Admitting: Gastroenterology

## 2018-09-04 ENCOUNTER — Encounter: Payer: Self-pay | Admitting: Gastroenterology

## 2018-09-04 NOTE — Telephone Encounter (Signed)
Prior Josem Kaufmann was submitted via CoverMyMeds by Dixon Boos, [place] on 08-06-2018 and we have not rec'd a decision.  Called pt and apologized for delay.  Let her know samples will be left at the front desk for her to pick up.  Pt indicated she will pick them up tomorrow.    Called and spoke to [place] at pharmacy help desk. Prior Josem Kaufmann has been approved. 08-08-2018 - 08-07-2019. Pt notified.  Samples NOT placed at front desk

## 2018-09-04 NOTE — Telephone Encounter (Signed)
PT call back in about the medication dexlansoprazole (DEXILANT) 60 MG capsule [[id number]]  And I advised per the notes it was sent to covermymeds and awaiting auth,. Pt wants someone to make a call back because she is almost out her medication and that is the only medication that works

## 2018-09-05 MED FILL — DEXILANT DR 60 MG CAPSULE: 60 | 90 days supply | Qty: 90 | Fill #0

## 2018-09-10 ENCOUNTER — Encounter (INDEPENDENT_AMBULATORY_CARE_PROVIDER_SITE_OTHER): Payer: Self-pay | Admitting: Physician Assistant

## 2018-09-10 ENCOUNTER — Ambulatory Visit (INDEPENDENT_AMBULATORY_CARE_PROVIDER_SITE_OTHER): Payer: 59 | Admitting: Physician Assistant

## 2018-09-10 VITALS — BP 126/80 | HR 64 | Temp 97.9°F | Ht 65.0 in | Wt 226.0 lb

## 2018-09-10 DIAGNOSIS — Z9189 Other specified personal risk factors, not elsewhere classified: Secondary | ICD-10-CM | POA: Diagnosis not present

## 2018-09-10 DIAGNOSIS — R7303 Prediabetes: Secondary | ICD-10-CM

## 2018-09-10 DIAGNOSIS — F3289 Other specified depressive episodes: Secondary | ICD-10-CM | POA: Diagnosis not present

## 2018-09-10 DIAGNOSIS — E559 Vitamin D deficiency, unspecified: Secondary | ICD-10-CM | POA: Diagnosis not present

## 2018-09-10 DIAGNOSIS — Z6837 Body mass index (BMI) 37.0-37.9, adult: Secondary | ICD-10-CM

## 2018-09-10 MED ORDER — METFORMIN HCL 500 MG PO TABS: 500.0000 mg | ORAL_TABLET | Freq: Every day | ORAL | 0 refills | Status: DC

## 2018-09-10 MED ORDER — VITAMIN D (ERGOCALCIFEROL) 1.25 MG (50000 UNIT) PO CAPS: 50000.0000 [IU] | ORAL_CAPSULE | ORAL | 0 refills | Status: DC

## 2018-09-10 MED FILL — metFORMIN HCL 500 MG TABS: 500 | 14 days supply | Qty: 14 | Fill #0

## 2018-09-10 MED FILL — VIT D2 1.25 MG (50,000 UNIT: 1.25 MG | 14 days supply | Qty: 2 | Fill #0

## 2018-09-10 NOTE — Progress Notes (Unsigned)
Office: [phone]  /  Fax: [phone]    Date: September 16, 2018  Time Seen: *** Duration: *** Provider: Glennie Isle, PsyD Type of Session: Intake for Individual Therapy  Type of Contact: Face-to-face  Informed Consent: The provider's role was explained to Rudene Christians. The provider reviewed and discussed issues of confidentiality, privacy, and limits therein. In addition to verbal informed consent, written informed consent for psychological services was obtained from [place] prior to the initial intake interview. Written consent included information concerning the practice, financial arrangements, and confidentiality and patients' rights. Since the clinic is not a 24/7 crisis center, mental health emergency resources were shared and a handout was provided. The provider further explained the utilization of MyChart, e-mail, voicemail, and/or other messaging systems can be utilized for non-emergency reasons. Bibiana verbally acknowledged understanding of the aforementioned, and agreed to use mental health emergency resources discussed if needed. Moreover, Adiana agreed information may be shared with other [facility] providers as needed for coordination of care, and written consent was obtained.   Chief Complaint: Taisa was referred by Abby Potash, PA-C due to {Reason for Referrals:22136}. Per the note for the visit with Abby Potash, PA-C on September 10, 2018,  Yulia was asked to complete a questionnaire assessing various behaviors related to emotional eating. Shatara endorsed the following: {gbmoodandfood:21755}.  HPI:  Per the note for the initial visit with Dr. Ilene Qua on December 12, 2017, Terea started gaining weight after having children/marriage and her heaviest weight ever was 235 lbs. During the initial appointment with Dr. Ilene Qua, Olin Hauser reported experiencing the following: significant food cravings issues , snacking frequently in  the evenings, frequently drinking liquids with calories, frequently making poor food choices, frequently eating larger portions than normal , binge eating behaviors, struggling with emotional eating, skipping meals frequently and having problems with excessive hunger.   Mental Status Examination: Dwayna arrived on time for the appointment. She presented as appropriately dressed and groomed. Jacci appeared her stated age and demonstrated adequate orientation to time, place, person, and purpose of the appointment. She also demonstrated appropriate eye contact. No psychomotor abnormalities or behavioral peculiarities noted. Her mood was {gbmood:21757} with congruent affect. Her thought processes were logical, linear, and goal-directed. No hallucinations, delusions, bizarre thinking or behavior reported or observed. Judgment, insight, and impulse control appeared to be grossly intact. There was no evidence of paraphasias (i.e., errors in speech, gross mispronunciations, and word substitutions), repetition deficits, or disturbances in volume or prosody (i.e., rhythm and intonation). There was no evidence of attention or memory impairments. Tanja denied current suicidal and homicidal ideation, plan, and intent.   The Montreal Cognitive Assessment (MoCA) was administered. The MoCA assesses different cognitive domains: attention and concentration, executive functions, memory, language, visuoconstructional skills, conceptual thinking, calculations, and orientation. Leitha received *** out of 30 points possible on the MoCA, which is noted in the *** range. ***[A point was added to the total score due to years of formal education being 12 years or fewer.]   Family & Psychosocial History: ***  Medical History: ***  Mental Health History: ***   Arma denied a trauma history, including {gbtrauma:22071} abuse, as well as neglect. ***  Tiarrah reported experiencing the following: {gbintakesxs:21966}  Caeleigh  reported experiencing worry thoughts regarding the following:   Ayana denied experiencing the following: {gbsxs:21965}  She also denied history of and current suicidal ideation, plan, and intent; history of and current homicidal ideation, plan, and intent; and history of and current  engagement in self-harm.  The following strengths were reported by Olin Hauser:  The following strengths were observed by this provider: {gbstrengths:22223}.  Structured Assessment Results: The Patient Health Questionnaire-9 (PHQ-9) is a self-report measure that assesses symptoms and severity of depression over the course of the last two weeks. Thelda obtained a score of *** suggesting {GBPHQ9SEVERITY:21752}. Shearon finds the endorsed symptoms to be {gbphq9difficulty:21754}.    The Generalized Anxiety Disorder-7 (GAD-7) is a brief self-report measure that assesses symptoms of anxiety over the course of the last two weeks. Yocheved obtained a score of *** suggesting {gbgad7severity:21753}.    Interventions: A chart review was conducted prior to the clinical intake interview. The MoCA, PHQ-9, and GAD-7 were administered and a clinical intake interview was completed. In addition, Anjanae was asked to complete a Mood and Food questionnaire to assess various behaviors related to emotional eating. Throughout session, empathic reflections and validation was provided. Continuing treatment with this provider was discussed and a treatment goal was established. Psychoeducation regarding emotional versus physical hunger was provided. Raesha was given a handout to utilize between now and the next appointment to increase awareness of hunger patterns and subsequent eating. ***  Provisional DSM-5 Diagnosis: ***  Plan: Kalaysia appears able and willing to participate as evidenced by collaboration on a treatment goal, engagement in reciprocal conversation, and asking questions as needed for clarification. The next appointment will be  scheduled in {gbweeks:21758}. The following treatment goal was established: {gbtxgoals:21759}. For the aforementioned goal, Perline can benefit from biweekly individual therapy sessions that are brief in duration for approximately four to six sessions. The treatment modality will be individual therapeutic services, including an eclectic therapeutic approach utilizing techniques from Cognitive Behavioral Therapy, Patient Centered Therapy, Dialectical Behavior Therapy, Acceptance and Commitment Therapy, Interpersonal Therapy, and Cognitive Restructuring. Therapeutic approach will include various interventions as appropriate, such as validation, support, mindfulness, thought defusion, reframing, psychoeducation, values assessment, and role playing. This provider will regularly review the treatment plan and medical chart to keep informed of status changes. Sabryna expressed understanding and agreement with the initial treatment plan of care.

## 2018-09-10 NOTE — Progress Notes (Signed)
Office: [phone]  /  Fax: [phone]   HPI:   Chief Complaint: OBESITY Emily Osborne is here to discuss her progress with her obesity treatment plan. She is on the Category 2 plan and is following her eating plan approximately 0 % of the time. She states she is exercising 0 minutes 0 times per week. Emily Osborne reports she is struggling with her plan due to stress at home. She also reports feeling depressed with recent deaths of friends and having issues with her children. She is ready to get back on track. Her weight is 226 lb (102.5 kg) today and has gained 2 lbs since her last visit. She has lost 0 lbs since starting treatment with [place].  Vitamin D deficiency Emily Osborne has a diagnosis of vitamin D deficiency. She is currently taking prescription Vit D and denies nausea, vomiting or muscle weakness.  Pre-Diabetes Emily Osborne has a diagnosis of prediabetes based on her elevated Hgb A1c and was informed this puts her at greater risk of developing diabetes. She is taking metformin currently and continues to work on diet and exercise to decrease risk of diabetes. She denies nausea or hypoglycemia.  Depression with emotional eating behaviors Emily Osborne is struggling with emotional eating due to stress at home and feeling depressed. She is using food for comfort to the extent that it is negatively impacting her health. She often snacks when she is not hungry. Emily Osborne sometimes feels she is out of control and then feels guilty that she made poor food choices. Emily Osborne states that she lacks motivation to follow the plan. She has been working on behavior modification techniques to help reduce her emotional eating and has been somewhat successful.  She shows no sign of suicidal or homicidal ideations.  Depression screen Emily Osborne 2/9 12/12/2017 06/18/2017 04/04/2017 12/13/2016 03/23/2016  Decreased Interest 1 0 0 0 0  Down, Depressed, Hopeless 1 0 0 0 0  PHQ - 2 Score 2 0 0 0 0  Altered sleeping 1 - - - -  Tired, decreased energy  1 - - - -  Change in appetite 2 - - - -  Feeling bad or failure about yourself  1 - - - -  Trouble concentrating 1 - - - -  Moving slowly or fidgety/restless 0 - - - -  Suicidal thoughts 0 - - - -  PHQ-9 Score 8 - - - -  Difficult doing work/chores Somewhat difficult - - - -  Some recent data might be hidden     At risk for osteopenia and osteoporosis Emily Osborne is at higher risk of osteopenia and osteoporosis due to vitamin D deficiency.    ASSESSMENT AND PLAN:  Vitamin D deficiency - Plan: Vitamin D, Ergocalciferol, (DRISDOL) 1.25 MG (50000 UT) CAPS capsule  Prediabetes - Plan: metFORMIN (GLUCOPHAGE) 500 MG tablet  Other depression - with emotional eating  At risk for osteoporosis  Class 2 severe obesity with serious comorbidity and body mass index (BMI) of 37.0 to 37.9 in adult, unspecified obesity type ([place])  PLAN:  Vitamin D Deficiency [name] was informed that low vitamin D levels contributes to fatigue and are associated with obesity, breast, and colon cancer. She agrees to continue to take prescription Vit D @50 ,000 IU every week #4 with no refills and will follow up for routine testing of vitamin D, at least 2-3 times per year. She was informed of the risk of over-replacement of vitamin D and agrees to not increase her dose unless she discusses this with [place] first.  Emily Osborne agrees to follow up with our clinic in 2 weeks.  Pre-Diabetes Emily Osborne will continue to work on weight loss, exercise, and decreasing simple carbohydrates in her diet to help decrease the risk of diabetes. We dicussed metformin including benefits and risks. She was informed that eating too many simple carbohydrates or too many calories at one sitting increases the likelihood of GI side effects. Emily Osborne agrees to continue taking metformin 500 mg qd #30 with no refills for now and a prescription was written today. Emily Osborne agreed to follow up with [place] as directed to monitor her progress. Emily Osborne agrees to follow up with  our clinic in 2 weeks.  Depression with Emotional Eating Behaviors We discussed behavior modification techniques today to help Emily Osborne deal with her emotional eating and depression. Emily Osborne agrees to be referred to Dr.Barker and follow up with our clinic in 2 weeks.  At risk for osteopenia and osteoporosis Emily Osborne was given extended  (15 minutes) osteoporosis prevention counseling today. Emily Osborne is at risk for osteopenia and osteoporosis due to her vitamin D deficiency. She was encouraged to take her vitamin D and follow her higher calcium diet and increase strengthening exercise to help strengthen her bones and decrease her risk of osteopenia and osteoporosis.  Obesity Emily Osborne is currently in the action stage of change. As such, her goal is to continue with weight loss efforts She has agreed to follow the Category 2 plan Emily Osborne has been instructed to work up to a goal of 150 minutes of combined cardio and strengthening exercise per week for weight loss and overall health benefits. We discussed the following Behavioral Modification Strategies today: work on meal planning and easy cooking plans and ways to avoid boredom eating  Emily Osborne has agreed to follow up with our clinic in 2 weeks. She was informed of the importance of frequent follow up visits to maximize her success with intensive lifestyle modifications for her multiple health conditions.  ALLERGIES: Allergies  Allergen Reactions  . Aspirin Other (See Comments)    REACTION: anaphylaxis  . Gentamicin Other (See Comments)    Eye drops turned the sclera bright red  . Ibuprofen Other (See Comments)    REACTION: anaphylaxsis  . Metronidazole Swelling    REACTION: red face/swelling  . Nsaids Other (See Comments)    REACTION: anaphylaxis  . Doxycycline Other (See Comments)    REACTION: severe nausea/vomiting  . Influenza A (H1n1) [place] Vac Other (See Comments)    REACTION: sick for 3 weeks  . Loratadine Other (See Comments)     Fatigue/weakness  . Periactin [Cyproheptadine] Other (See Comments)    paranoid   . Ranitidine Hcl Other (See Comments)    REACTION: Lips turned red /peel  . Sulfamethoxazole-Trimethoprim Other (See Comments)    REACTION: face red/peel  . Tramadol Hcl Other (See Comments)    REACTION: paranoid    MEDICATIONS: Current Outpatient Medications on File Prior to Visit  Medication Sig Dispense Refill  . acetaminophen (TYLENOL) 500 MG tablet Take 1,000 mg by mouth every 6 (six) hours as needed for moderate pain.     Marland Kitchen albuterol (PROAIR HFA) 108 (90 Base) MCG/ACT inhaler USE 2 PUFFS EVERY 4 HOURS AS NEEDED FOR COUGH, WHEEZE OR SHORTNESS OF BREATH. 8.5 each 2  . ALPRAZolam (XANAX) 0.25 MG tablet Take 1 tablet (0.25 mg total) by mouth 3 (three) times daily as needed. 30 tablet 2  . atenolol (TENORMIN) 25 MG tablet TAKE 1/2 TABLETS (12.5 MG TOTAL) BY MOUTH DAILY 45 tablet 3  .  azelastine (OPTIVAR) 0.05 % ophthalmic solution Place 1 drop into both eyes 2 (two) times daily as needed.    . Blood Glucose Monitoring Suppl (FREESTYLE FREEDOM) KIT 1 Device by Does not apply route daily. 1 each 0  . dexlansoprazole (DEXILANT) 60 MG capsule Take 1 capsule (60 mg total) by mouth daily. 90 capsule 0  . fexofenadine (ALLEGRA) 180 MG tablet Take 1 tablet (180 mg total) by mouth daily. 90 tablet 3  . Flaxseed, Linseed, (FLAXSEED OIL PO) Take by mouth. One capsule daily    . fluconazole (DIFLUCAN) 150 MG tablet 1 po x1, may repeat in 3 days prn 2 tablet 0  . fluticasone (FLONASE) 50 MCG/ACT nasal spray PLACE 2 SPRAYS INTO BOTH NOSTRILS DAILY. 16 g 4  . Fluticasone-Salmeterol (ADVAIR DISKUS) 250-50 MCG/DOSE AEPB TAKE 1 PUFF BY MOUTH TWICE A DAY 60 each 2  . FREESTYLE LITE test strip USE TO CHECK BLOOD SUGAR ONCE A DAY 100 each 0  . Iron Combinations (I.L.X. B-12) ELIX Take 1 each by mouth daily as needed (energy). 1 droplet full    . lidocaine (LIDODERM) 5 % Apply to affected area QD PRN for a Week 30 patch 0  .  Multiple Vitamins-Minerals (MULTIVITAMIN WITH MINERALS) tablet Take 1 tablet by mouth daily.    . valACYclovir (VALTREX) 1000 MG tablet Take 1,000 mg by mouth 3 (three) times daily as needed.     No current facility-administered medications on file prior to visit.     PAST MEDICAL HISTORY: Past Medical History:  Diagnosis Date  . Allergy   . Anxiety   . Asthma    pt has inhaler  . Back pain   . C. difficile colitis   . Cataract    bil eyes  . Chest pain   . Chronic fatigue syndrome   . Clostridium difficile colitis 12/13/2016  . Diverticulitis   . Dyspnea   . Dysrhythmia    palpitations  . Edema   . Elevated blood pressure reading without diagnosis of hypertension    "just elevated when I'm in pain" (12/07/2015)  . Fatty liver   . Fibromyalgia   . Ganglion of joint    right wrist  . GERD (gastroesophageal reflux disease)   . H. pylori infection   . Headache    hx of  . Helicobacter pylori (H. pylori) infection 12/13/2016  . Herpes zoster without mention of complication   . HTN (hypertension)   . Hyperlipidemia   . Hyperthyroidism   . IBS (irritable bowel syndrome)   . Leg edema   . Multiple drug allergies 06/18/2017  . Myalgia   . Nontraumatic rupture of Achilles tendon   . Other acute reactions to stress   . Pain in joint, shoulder region   . Pain in joint, upper arm   . Pain in limb   . Palpitations   . Personal history of other diseases of digestive system   . Pneumonia    once  . PONV (postoperative nausea and vomiting)   . Routine screening for STI (sexually transmitted infection) 12/13/2016  . S/P partial colectomy 06/18/2017  . Thyroid disease    hyperthyroidism  . Type 2 diabetes mellitus with hyperglycemia, without long-term current use of insulin ([place]) 08/07/2018  . Vitamin B 12 deficiency     PAST SURGICAL HISTORY: Past Surgical History:  Procedure Laterality Date  . ABDOMINAL HYSTERECTOMY    . ACHILLES TENDON REPAIR Right 2005  . BUNIONECTOMY  Bilateral    Bunionectomy  [place] [facility]  01/23/2017   6 to 8 inches sigmoid colon removed  . GANGLION CYST EXCISION Right    wrist  . LEFT HEART CATH AND CORONARY ANGIOGRAPHY N/A 10/03/2016   Procedure: Left Heart Cath and Coronary Angiography;  Surgeon: Burnell Blanks, MD;  Location: [facility];  Service: Cardiovascular;  Laterality: N/A;  . MENISCUS REPAIR Right 04/2014  . TUBAL LIGATION    . wisdomteeth extraction      SOCIAL HISTORY: Social History   Tobacco Use  . Smoking status: Never Smoker  . Smokeless tobacco: Never Used  Substance Use Topics  . Alcohol use: No    Alcohol/week: 0.0 standard drinks  . Drug use: No    FAMILY HISTORY: Family History  Problem Relation Age of Onset  . Hypertension Father   . Diabetes Father   . Heart disease Father   . Kidney disease Father        Died, 64  . Hyperlipidemia Father   . Leukemia Brother   . Cancer Brother        myleoblastic anemia  . Hyperlipidemia Mother   . Hypertension Mother        Died, 79  . Thyroid disease Mother        Thyroid surgery  . Obesity Mother   . Heart disease Mother   . Pernicious anemia Sister   . Thyroid disease Sister        On thyroid Rx  . Lupus Daughter   . Coronary artery disease Brother   . Hypertension Brother   . Colon cancer Neg Hx   . Esophageal cancer Neg Hx   . Rectal cancer Neg Hx   . Stomach cancer Neg Hx   . Pancreatic cancer Neg Hx   . Prostate cancer Neg Hx     ROS: Review of Systems  Constitutional: Negative for weight loss.  Gastrointestinal: Negative for diarrhea, nausea and vomiting.  Genitourinary:       Negative for polyuria  Musculoskeletal:       Negative for muscle weakness  Endo/Heme/Allergies: Negative for polydipsia.       Negative for hypoglycemia Negative for polyphagia  Psychiatric/Behavioral: Positive for depression. Negative for suicidal ideas.       Negative for homicidal ideations    PHYSICAL EXAM: Blood pressure  126/80, pulse 64, temperature 97.9 F (36.6 C), temperature source Oral, height 5' 5"  (1.651 m), weight 226 lb (102.5 kg), SpO2 99 %. Body mass index is 37.61 kg/m. Physical Exam Vitals signs reviewed.  Constitutional:      Appearance: Normal appearance. She is obese.  Cardiovascular:     Rate and Rhythm: Normal rate.     Pulses: Normal pulses.  Pulmonary:     Effort: Pulmonary effort is normal.  Musculoskeletal: Normal range of motion.  Skin:    General: Skin is warm and dry.  Neurological:     Mental Status: She is alert and oriented to person, place, and time.  Psychiatric:        Mood and Affect: Mood normal.        Behavior: Behavior normal.     RECENT LABS AND TESTS: BMET    Component Value Date/Time   NA 142 08/07/2018 1208   NA 142 03/20/2018 1046   K 4.0 08/07/2018 1208   CL 104 08/07/2018 1208   CO2 30 08/07/2018 1208   GLUCOSE 92 08/07/2018 1208   BUN 15 08/07/2018 1208   BUN 17 03/20/2018  1046   CREATININE 0.86 08/07/2018 1208   CREATININE 0.97 03/23/2016 1501   CALCIUM 9.5 08/07/2018 1208   GFRNONAA 65 03/20/2018 1046   GFRAA 75 03/20/2018 1046   Lab Results  Component Value Date   HGBA1C 5.8 08/07/2018   HGBA1C 6.0 05/13/2018   HGBA1C 5.6 03/20/2018   HGBA1C 5.8 (H) 12/12/2017   HGBA1C 5.5 01/15/2017   Lab Results  Component Value Date   INSULIN 20.0 03/20/2018   INSULIN 17.6 12/12/2017   CBC    Component Value Date/Time   WBC 5.1 08/07/2018 1208   RBC 4.54 08/07/2018 1208   HGB 13.2 08/07/2018 1208   HGB 13.5 12/12/2017 1102   HCT 40.2 08/07/2018 1208   HCT 41.8 12/12/2017 1102   PLT 245.0 08/07/2018 1208   MCV 88.6 08/07/2018 1208   MCV 89 12/12/2017 1102   MCH 28.6 12/12/2017 1102   MCH 28.9 02/08/2017 0909   MCHC 32.9 08/07/2018 1208   RDW 15.0 08/07/2018 1208   RDW 15.1 12/12/2017 1102   LYMPHSABS 2.3 08/07/2018 1208   LYMPHSABS 2.3 12/12/2017 1102   MONOABS 0.5 08/07/2018 1208   EOSABS 0.1 08/07/2018 1208   EOSABS 0.1  12/12/2017 1102   BASOSABS 0.0 08/07/2018 1208   BASOSABS 0.0 12/12/2017 1102   Iron/TIBC/Ferritin/ %Sat No results found for: IRON, TIBC, FERRITIN, IRONPCTSAT Lipid Panel     Component Value Date/Time   CHOL 192 08/07/2018 1208   CHOL 197 03/20/2018 1046   TRIG 167.0 (H) 08/07/2018 1208   HDL 53.50 08/07/2018 1208   HDL 57 03/20/2018 1046   CHOLHDL 4 08/07/2018 1208   VLDL 33.4 08/07/2018 1208   LDLCALC 105 (H) 08/07/2018 1208   LDLCALC 124 (H) 03/20/2018 1046   LDLDIRECT 139.4 12/26/2011 0905   Hepatic Function Panel     Component Value Date/Time   PROT 6.9 08/07/2018 1208   PROT 6.8 03/20/2018 1046   ALBUMIN 4.2 08/07/2018 1208   ALBUMIN 4.4 03/20/2018 1046   AST 20 08/07/2018 1208   ALT 22 08/07/2018 1208   ALKPHOS 60 08/07/2018 1208   BILITOT 0.3 08/07/2018 1208   BILITOT 0.4 03/20/2018 1046   BILIDIR 0.1 09/07/2014 1031      Component Value Date/Time   TSH 0.44 08/07/2018 1208   TSH 0.67 06/05/2018 1638   TSH 0.74 05/13/2018 1513     Ref. Range 03/20/2018 10:46  Vitamin D, 25-Hydroxy Latest Ref Range: 30.0 - 100.0 ng/mL 31.0     OBESITY BEHAVIORAL INTERVENTION VISIT  Today's visit was # 11   Starting weight: 226 lbs Starting date: 12/12/2017 Today's weight :: 226 lbs Today's date: 09/10/2018 Total lbs lost to date: 0  ASK: We discussed the diagnosis of obesity with Rudene Christians today and Nami agreed to give [place] permission to discuss obesity behavioral modification therapy today.  ASSESS: Margrit has the diagnosis of obesity and her BMI today is 37.61 Verley is in the action stage of change   ADVISE: Sophiamarie was educated on the multiple health risks of obesity as well as the benefit of weight loss to improve her health. She was advised of the need for long term treatment and the importance of lifestyle modifications to improve her current health and to decrease her risk of future health problems.  AGREE: Multiple dietary modification options and  treatment options were discussed and  Almeda agreed to follow the recommendations documented in the above note.  ARRANGE: Aleni was educated on the importance of frequent visits to treat  obesity as outlined per CMS and USPSTF guidelines and agreed to schedule her next follow up appointment today.  I, Tammy Wysor, am acting as Location manager for [employer] I, Abby Potash, PA-C have reviewed above note and agree with its content

## 2018-09-16 ENCOUNTER — Encounter (INDEPENDENT_AMBULATORY_CARE_PROVIDER_SITE_OTHER): Payer: Self-pay

## 2018-09-16 ENCOUNTER — Ambulatory Visit (INDEPENDENT_AMBULATORY_CARE_PROVIDER_SITE_OTHER): Payer: 59 | Admitting: Psychology

## 2018-09-18 DIAGNOSIS — M47892 Other spondylosis, cervical region: Secondary | ICD-10-CM | POA: Diagnosis not present

## 2018-09-18 DIAGNOSIS — M542 Cervicalgia: Secondary | ICD-10-CM | POA: Diagnosis not present

## 2018-09-19 DIAGNOSIS — R002 Palpitations: Secondary | ICD-10-CM | POA: Diagnosis not present

## 2018-09-23 ENCOUNTER — Ambulatory Visit (INDEPENDENT_AMBULATORY_CARE_PROVIDER_SITE_OTHER): Payer: 59 | Admitting: Physician Assistant

## 2018-09-23 ENCOUNTER — Encounter (INDEPENDENT_AMBULATORY_CARE_PROVIDER_SITE_OTHER): Payer: Self-pay

## 2018-09-23 DIAGNOSIS — M47892 Other spondylosis, cervical region: Secondary | ICD-10-CM | POA: Diagnosis not present

## 2018-09-23 DIAGNOSIS — M542 Cervicalgia: Secondary | ICD-10-CM | POA: Diagnosis not present

## 2018-09-25 DIAGNOSIS — M542 Cervicalgia: Secondary | ICD-10-CM | POA: Diagnosis not present

## 2018-09-25 DIAGNOSIS — M47892 Other spondylosis, cervical region: Secondary | ICD-10-CM | POA: Diagnosis not present

## 2018-09-30 ENCOUNTER — Encounter (INDEPENDENT_AMBULATORY_CARE_PROVIDER_SITE_OTHER): Payer: Self-pay

## 2018-09-30 DIAGNOSIS — M47892 Other spondylosis, cervical region: Secondary | ICD-10-CM | POA: Diagnosis not present

## 2018-09-30 DIAGNOSIS — M542 Cervicalgia: Secondary | ICD-10-CM | POA: Diagnosis not present

## 2018-10-01 ENCOUNTER — Encounter (INDEPENDENT_AMBULATORY_CARE_PROVIDER_SITE_OTHER): Payer: Self-pay | Admitting: Family Medicine

## 2018-10-01 ENCOUNTER — Telehealth: Payer: Self-pay | Admitting: *Deleted

## 2018-10-01 ENCOUNTER — Ambulatory Visit (AMBULATORY_SURGERY_CENTER): Payer: Self-pay | Admitting: *Deleted

## 2018-10-01 ENCOUNTER — Encounter: Payer: Self-pay | Admitting: Gastroenterology

## 2018-10-01 ENCOUNTER — Other Ambulatory Visit: Payer: Self-pay

## 2018-10-01 VITALS — Ht 65.0 in | Wt 226.6 lb

## 2018-10-01 DIAGNOSIS — Z86010 Personal history of colonic polyps: Secondary | ICD-10-CM

## 2018-10-01 MED ORDER — SUPREP BOWEL PREP KIT 17.5-3.13-1.6 GM/177ML PO SOLN: 1.0000 | Freq: Once | ORAL | 0 refills | Status: AC

## 2018-10-01 MED FILL — SUPREP BOWEL PREP KIT: 17.5-3.13-1 | 1 days supply | Qty: 354 | Fill #0

## 2018-10-01 NOTE — Progress Notes (Signed)
No egg or soy allergy known to patient  No issues with past sedation with any surgeries  or procedures, no intubation problems  No diet pills per patient No home 02 use per patient  No blood thinners per patient  Pt denies issues with constipation  No A fib or A flutter  Patient having upper GI symptoms, requesting Endo with Colon. Telephone note to Dr Wilma Flavin regarding adding Endo to procedure. Patient aware that procedure date may be changed if double procedure scheduled. During history intake, patient denies Diabetes and HTN. Stating [facility] prescribed Metformin. Also stating she does not take Iron, that medication listed is a Vit B12 supplement, patient will check the bottle and stop 5 days prior if iron included.

## 2018-10-01 NOTE — Telephone Encounter (Signed)
Patient in Previsit this am. Patient is having a lot of upper abdominal pain, bloating, tightness, nauseated and "miserable after she eats."Patient stating she does not feel like eating related to discomfort. Patient with tiredness and has contacted her PCP regarding this complaint. Patient stating she is wondering if an Endo can be scheduled for the day of her Colonoscopy. Please advise.

## 2018-10-01 NOTE — Telephone Encounter (Signed)
I think that is reasonable. 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 Okay to do both. Thanks

## 2018-10-02 ENCOUNTER — Other Ambulatory Visit (INDEPENDENT_AMBULATORY_CARE_PROVIDER_SITE_OTHER): Payer: Self-pay

## 2018-10-02 ENCOUNTER — Ambulatory Visit: Payer: 59 | Admitting: Podiatry

## 2018-10-02 ENCOUNTER — Other Ambulatory Visit: Payer: Self-pay | Admitting: Podiatry

## 2018-10-02 ENCOUNTER — Ambulatory Visit (INDEPENDENT_AMBULATORY_CARE_PROVIDER_SITE_OTHER): Payer: 59

## 2018-10-02 ENCOUNTER — Encounter: Payer: Self-pay | Admitting: Podiatry

## 2018-10-02 DIAGNOSIS — M779 Enthesopathy, unspecified: Secondary | ICD-10-CM

## 2018-10-02 DIAGNOSIS — M25572 Pain in left ankle and joints of left foot: Secondary | ICD-10-CM

## 2018-10-02 DIAGNOSIS — M7752 Other enthesopathy of left foot: Secondary | ICD-10-CM

## 2018-10-02 DIAGNOSIS — E559 Vitamin D deficiency, unspecified: Secondary | ICD-10-CM

## 2018-10-02 DIAGNOSIS — M47892 Other spondylosis, cervical region: Secondary | ICD-10-CM | POA: Diagnosis not present

## 2018-10-02 DIAGNOSIS — R7303 Prediabetes: Secondary | ICD-10-CM

## 2018-10-02 DIAGNOSIS — M7672 Peroneal tendinitis, left leg: Secondary | ICD-10-CM

## 2018-10-02 DIAGNOSIS — M542 Cervicalgia: Secondary | ICD-10-CM | POA: Diagnosis not present

## 2018-10-02 MED ORDER — VITAMIN D (ERGOCALCIFEROL) 1.25 MG (50000 UNIT) PO CAPS: 50000.0000 [IU] | ORAL_CAPSULE | ORAL | 0 refills | Status: DC

## 2018-10-02 MED ORDER — METFORMIN HCL 500 MG PO TABS: 500.0000 mg | ORAL_TABLET | Freq: Every day | ORAL | 0 refills | Status: DC

## 2018-10-02 MED ORDER — DICLOFENAC SODIUM 1 % TD GEL: 2.0000 g | Freq: Four times a day (QID) | TRANSDERMAL | 2 refills | Status: AC

## 2018-10-02 MED FILL — VIT D2 1.25 MG (50,000 UNIT: 1.25 MG | 28 days supply | Qty: 4 | Fill #0

## 2018-10-02 MED FILL — DICLOFENAC SODIUM 1 % GEL: 1 | 13 days supply | Qty: 100 | Fill #0

## 2018-10-02 MED FILL — metFORMIN HCL 500 MG TABS: 500 | 30 days supply | Qty: 30 | Fill #0

## 2018-10-02 NOTE — Telephone Encounter (Signed)
Pt has been called and reschedule for 2-27 at 3:30 for both procedures.

## 2018-10-02 NOTE — Patient Instructions (Signed)
Peroneal Tendinopathy Rehab  Ask your health care provider which exercises are safe for you. Do exercises exactly as told by your health care provider and adjust them as directed. It is normal to feel mild stretching, pulling, tightness, or discomfort as you do these exercises, but you should stop right away if you feel sudden pain or your pain gets worse. Do not begin these exercises until told by your health care provider.  Stretching and range of motion exercises  These exercises warm up your muscles and joints and improve the movement and flexibility of your ankle. These exercises also help to relieve pain and stiffness.  Exercise A: Gastroc and soleus, standing  1. Stand on the edge of a step on the balls of your feet. The ball of your foot is on the walking surface, right under your toes.  2. Hold onto the railing for balance.  3. Slowly lift your left / right foot, allowing your body weight to press your left / right heel down over the edge of the step. You should feel a stretch in your left / right calf.  4. Hold this position for __________ seconds.  Repeat __________ times with your left / right knee straight and __________ times with your left / right knee bent. Complete this stretch __________ times per day.  Strengthening exercises  These exercises improve the strength and endurance of your foot and ankle. Endurance is the ability to use your muscles for a long time, even after they get tired.  Exercise B: Dorsiflexors    1. Secure a rubber exercise band or tube to an object, like a table leg, that will not move if it is pulled on.  2. Secure the other end of the band around your left / right foot.  3. Sit on the floor, facing the object with your left / right foot extended. The band or tube should be slightly tense when your foot is relaxed.  4. Slowly flex your left / right ankle and toes to bring your foot toward you.  5. Hold this position for __________ seconds.  6. Slowly return your foot to the  starting position.  Repeat __________ times. Complete this exercise __________ times per day.  Exercise C: Evertors  1. Sit on the floor with your legs straight out in front of you.  2. Loop a rubber exercise or band or tube around the ball of your left / right foot. The ball of your foot is on the walking surface, right under your toes.  3. Hold the ends of the band in your hands, or secure the band to a stable object.  4. Slowly push your foot outward, away from your other leg.  5. Hold this position for __________ seconds.  6. Slowly return your foot to the starting position.  Repeat __________ times. Complete this exercise __________ times per day.  Exercise D: Standing heel raise (plantar flexion)  1. Stand with your feet shoulder-width apart with the balls of your feet on a step. The ball of your foot is on the walking surface, right under your toes.  2. Keep your weight spread evenly over the width of your feet while you rise up on your toes. Use a wall or railing to steady yourself, but try not to use it for support.  3. If this exercise is too easy, try these options:  ? Shift your weight toward your left / right leg until you feel challenged.  ? If told by your health   care provider, stand on your left / right leg only.  4. Hold this position for __________ seconds.  Repeat __________ times. Complete this exercise __________ times per day.  Exercise E: Single leg stand  1. Without shoes, stand near a railing or in a doorway. You may hold onto the railing or door frame as needed.  2. Stand on your left / right foot. Keep your big toe down on the floor and try to keep your arch lifted.  ? Do not roll to the outside of your foot.  ? If this exercise is too easy, you can try it with your eyes closed or while standing on a pillow.  3. Hold this position for __________ seconds.  Repeat __________ times. Complete this exercise __________ times per day.  This information is not intended to replace advice given to  you by your health care provider. Make sure you discuss any questions you have with your health care provider.  Document Released: 08/06/2005 Document Revised: 04/12/2016 Document Reviewed: 06/25/2015  [employer]vier Interactive Patient Education © 2019 [employer]

## 2018-10-07 ENCOUNTER — Encounter: Payer: Self-pay | Admitting: Internal Medicine

## 2018-10-07 ENCOUNTER — Ambulatory Visit: Payer: 59 | Admitting: Internal Medicine

## 2018-10-07 VITALS — BP 110/80 | HR 74 | Ht 65.0 in | Wt 227.0 lb

## 2018-10-07 DIAGNOSIS — M47892 Other spondylosis, cervical region: Secondary | ICD-10-CM | POA: Diagnosis not present

## 2018-10-07 DIAGNOSIS — E0500 Thyrotoxicosis with diffuse goiter without thyrotoxic crisis or storm: Secondary | ICD-10-CM | POA: Diagnosis not present

## 2018-10-07 DIAGNOSIS — R7303 Prediabetes: Secondary | ICD-10-CM | POA: Diagnosis not present

## 2018-10-07 DIAGNOSIS — M542 Cervicalgia: Secondary | ICD-10-CM | POA: Diagnosis not present

## 2018-10-07 LAB — T3, FREE: T3, Free: 3.1 pg/mL (ref 2.3–4.2)

## 2018-10-07 LAB — T4, FREE: Free T4: 0.76 ng/dL (ref 0.60–1.60)

## 2018-10-07 LAB — TSH: TSH: 0.87 u[IU]/mL (ref 0.35–4.50)

## 2018-10-07 NOTE — Progress Notes (Signed)
Patient ID: Emily Osborne, female   DOB: 05-10-1961, 58 y.o.   MRN: [id number]    HPI  Emily Osborne is a very pleasant 58 y.o.-year-old female, returning for f/u for Graves' disease. Last visit 1 year and 5 months ago.  She started to feel very fatigued in last month. She started to work nights in 03/2018 and she feels that during the weekend she can only sleep and rest, while previously she was very energetic.  Last week: started to have palpitations and also heat and cold intolerance.  Patient's TFTs remains normal since 2017: Lab Results  Component Value Date   TSH 0.44 08/07/2018   TSH 0.67 06/05/2018   TSH 0.74 05/13/2018   TSH 0.83 11/13/2017   TSH 0.90 05/07/2017   TSH 1.11 10/05/2016   TSH 1.06 06/28/2016   TSH 1.30 04/27/2016   TSH 0.90 03/23/2016   TSH 0.01 (L) 12/27/2015   FREET4 0.75 11/13/2017   FREET4 0.76 05/07/2017   FREET4 0.78 10/05/2016   FREET4 0.74 06/28/2016   FREET4 0.76 04/27/2016   FREET4 1.61 (H) 12/27/2015   FREET4 1.34 (H) 12/08/2015   FREET4 1.18 11/02/2015   FREET4 0.90 10/28/2013   FREET4 0.8 12/27/2009    TSI levels decreased to undetectable at last visit: Lab Results  Component Value Date   TSI <89 05/07/2017   TSI 393 (H) 12/27/2015   We initially started methimazole 5 mg daily and subsequent TFTs have normalized.  She was the low dose of methimazole, 2.5 mg daily >> stopped 10/2017.  We also started atenolol 25 mg twice a day and she was able to come off but we had restart it 2 years ago as she had intermittent chest pain.  She continues on atenolol 12.5 mg daily now.  She does develop chest pain if she stops this (unclear if placebo component).  She had a cardiac cath on 10/03/2016 showing no pathology. She saw cardiology 08/2018 >> heart monitor for 2 weeks: Notes recorded by Buford Dresser, MD on 09/23/2018 at 10:09 AM EST Monitor overall looks normal. The 8 events that were from pressing the button were all normal sinus  rhythm. There was one very brief episode that looked a little irregular, but it happened in the middle of the night and only lasted about two seconds. This is fine. No abnormal rhythms that need to be treated.  Pt denies: - feeling nodules in neck - hoarseness - dysphagia - choking - SOB with lying down  Pt does not have a FH of thyroid ds. (mother, sister). No FH of thyroid cancer. No h/o radiation tx to head or neck.  No seaweed or kelp. No recent contrast studies, except for CT angiogram of chest in 05/2018). No herbal supplements.  No biotin use. No recent steroids use.   She also has a history of B12 deficiency.  She had sigmoidectomy before last visit due to diverticulitis.  ROS: Constitutional: no weight gain/no weight loss, + fatigue, + subjective hyperthermia, no subjective hypothermia Eyes: no blurry vision, no xerophthalmia ENT: no sore throat, + see HPI Cardiovascular: no CP/no SOB/+ palpitations/no leg swelling Respiratory: no cough/no SOB/no wheezing Gastrointestinal: no N/no V/no D/no C/no acid reflux Musculoskeletal: no muscle aches/no joint aches Skin: no rashes, no hair loss Neurological: no tremors/no numbness/no tingling/no dizziness  I reviewed pt's medications, allergies, PMH, social hx, family hx, and changes were documented in the history of present illness. Otherwise, unchanged from my initial visit note.  Past Medical History:  Diagnosis Date  . Allergy   . Anxiety   . Asthma    pt has inhaler  . Back pain   . C. difficile colitis   . Cataract    bil eyes  . Chest pain   . Chronic fatigue syndrome   . Clostridium difficile colitis 12/13/2016  . Diverticulitis   . Dyspnea   . Dysrhythmia    palpitations  . Edema   . Elevated blood pressure reading without diagnosis of hypertension    "just elevated when I'm in pain" (12/07/2015)  . Fatty liver   . Fibromyalgia   . Ganglion of joint    right wrist  . GERD (gastroesophageal reflux disease)    . H. pylori infection   . Headache    hx of  . Helicobacter pylori (H. pylori) infection 12/13/2016  . Herpes zoster without mention of complication   . HTN (hypertension)   . Hyperlipidemia   . Hyperthyroidism   . IBS (irritable bowel syndrome)   . Leg edema   . Multiple drug allergies 06/18/2017  . Myalgia   . Nontraumatic rupture of Achilles tendon   . Other acute reactions to stress   . Pain in joint, shoulder region   . Pain in joint, upper arm   . Pain in limb   . Palpitations   . Personal history of other diseases of digestive system   . Pneumonia    once  . PONV (postoperative nausea and vomiting)   . Routine screening for STI (sexually transmitted infection) 12/13/2016  . S/P partial colectomy 06/18/2017  . Thyroid disease    hyperthyroidism  . Type 2 diabetes mellitus with hyperglycemia, without long-term current use of insulin ([place]) 08/07/2018  . Vitamin B 12 deficiency    Past Surgical History:  Procedure Laterality Date  . ABDOMINAL HYSTERECTOMY    . ACHILLES TENDON REPAIR Right 2005  . BUNIONECTOMY Bilateral    Bunionectomy 1983  . COLON SURGERY  01/23/2017   6 to 8 inches sigmoid colon removed  . COLONOSCOPY    . GANGLION CYST EXCISION Right    wrist  . LEFT HEART CATH AND CORONARY ANGIOGRAPHY N/A 10/03/2016   Procedure: Left Heart Cath and Coronary Angiography;  Surgeon: Burnell Blanks, MD;  Location: [facility];  Service: Cardiovascular;  Laterality: N/A;  . MENISCUS REPAIR Right 04/2014  . POLYPECTOMY    . TUBAL LIGATION    . wisdomteeth extraction     Social History   Socioeconomic History  . Marital status: Divorced    Spouse name: Not on file  . Number of children: 2  . Years of education: Not on file  . Highest education level: Not on file  Occupational History  . Occupation: Personnel officer  Social Needs  . Financial resource strain: Not on file  . Food insecurity:    Worry: Not on file    Inability: Not on file  .  Transportation needs:    Medical: Not on file    Non-medical: Not on file  Tobacco Use  . Smoking status: Never Smoker  . Smokeless tobacco: Never Used  Substance and Sexual Activity  . Alcohol use: No    Alcohol/week: 0.0 standard drinks  . Drug use: No  . Sexual activity: Not Currently    Partners: Male  Lifestyle  . Physical activity:    Days per week: Not on file    Minutes per session: Not on file  . Stress: Not on file  Relationships  . Social connections:    Talks on phone: Not on file    Gets together: Not on file    Attends religious service: Not on file    Active member of club or organization: Not on file    Attends meetings of clubs or organizations: Not on file    Relationship status: Not on file  . Intimate partner violence:    Fear of current or ex partner: Not on file    Emotionally abused: Not on file    Physically abused: Not on file    Forced sexual activity: Not on file  Other Topics Concern  . Not on file  Social History Narrative   Lives with alone.  She has two daughter.   She works as a Programme researcher, broadcasting/film/video at [employer]--- no   Current Outpatient Medications on File Prior to Visit  Medication Sig Dispense Refill  . acetaminophen (TYLENOL) 500 MG tablet Take 1,000 mg by mouth every 6 (six) hours as needed for moderate pain.     Marland Kitchen albuterol (PROAIR HFA) 108 (90 Base) MCG/ACT inhaler USE 2 PUFFS EVERY 4 HOURS AS NEEDED FOR COUGH, WHEEZE OR SHORTNESS OF BREATH. 8.5 each 2  . ALPRAZolam (XANAX) 0.25 MG tablet Take 1 tablet (0.25 mg total) by mouth 3 (three) times daily as needed. 30 tablet 2  . atenolol (TENORMIN) 25 MG tablet TAKE 1/2 TABLETS (12.5 MG TOTAL) BY MOUTH DAILY 45 tablet 3  . azelastine (OPTIVAR) 0.05 % ophthalmic solution Place 1 drop into both eyes 2 (two) times daily as needed.    . Blood Glucose Monitoring Suppl (FREESTYLE FREEDOM) KIT 1 Device by Does not apply route daily. 1 each 0  . dexlansoprazole (DEXILANT) 60 MG capsule Take  1 capsule (60 mg total) by mouth daily. 90 capsule 0  . diclofenac sodium (VOLTAREN) 1 % GEL Apply 2 g topically 4 (four) times daily. Rub into affected area of foot 2 to 4 times daily 100 g 2  . fexofenadine (ALLEGRA) 180 MG tablet Take 1 tablet (180 mg total) by mouth daily. 90 tablet 3  . Flaxseed, Linseed, (FLAXSEED OIL PO) Take by mouth. One capsule daily    . fluconazole (DIFLUCAN) 150 MG tablet 1 po x1, may repeat in 3 days prn 2 tablet 0  . fluticasone (FLONASE) 50 MCG/ACT nasal spray PLACE 2 SPRAYS INTO BOTH NOSTRILS DAILY. 16 g 4  . Fluticasone-Salmeterol (ADVAIR DISKUS) 250-50 MCG/DOSE AEPB TAKE 1 PUFF BY MOUTH TWICE A DAY 60 each 2  . FREESTYLE LITE test strip USE TO CHECK BLOOD SUGAR ONCE A DAY 100 each 0  . Iron Combinations (I.L.X. B-12) ELIX Take 1 each by mouth daily as needed (energy). 1 droplet full    . lidocaine (LIDODERM) 5 % Apply to affected area QD PRN for a Week 30 patch 0  . metFORMIN (GLUCOPHAGE) 500 MG tablet Take 1 tablet (500 mg total) by mouth daily with breakfast. 30 tablet 0  . Multiple Vitamins-Minerals (MULTIVITAMIN WITH MINERALS) tablet Take 1 tablet by mouth daily.    . valACYclovir (VALTREX) 1000 MG tablet Take 1,000 mg by mouth 3 (three) times daily as needed.    . Vitamin D, Ergocalciferol, (DRISDOL) 1.25 MG (50000 UT) CAPS capsule Take 1 capsule (50,000 Units total) by mouth every 7 (seven) days. 4 capsule 0   No current facility-administered medications on file prior to visit.    Allergies  Allergen Reactions  . Aspirin Other (See Comments)  REACTION: anaphylaxis  . Gentamicin Other (See Comments)    Eye drops turned the sclera bright red  . Ibuprofen Other (See Comments)    REACTION: anaphylaxsis  . Metronidazole Swelling    REACTION: red face/swelling  . Nsaids Other (See Comments)    REACTION: anaphylaxis  . Doxycycline Other (See Comments)    REACTION: severe nausea/vomiting  . Influenza A (H1n1) Monoval Vac Other (See Comments)     REACTION: sick for 3 weeks  . Loratadine Other (See Comments)    Fatigue/weakness  . Periactin [Cyproheptadine] Other (See Comments)    paranoid   . Ranitidine Hcl Other (See Comments)    REACTION: Lips turned red /peel  . Sulfamethoxazole-Trimethoprim Other (See Comments)    REACTION: face red/peel  . Tramadol Hcl Other (See Comments)    REACTION: paranoid   Family History  Problem Relation Age of Onset  . Hypertension Father   . Diabetes Father   . Heart disease Father   . Kidney disease Father        Died, 53  . Hyperlipidemia Father   . Leukemia Brother   . Cancer Brother        myleoblastic anemia  . Hyperlipidemia Mother   . Hypertension Mother        Died, 68  . Thyroid disease Mother        Thyroid surgery  . Obesity Mother   . Heart disease Mother   . Pernicious anemia Sister   . Thyroid disease Sister        On thyroid Rx  . Lupus Daughter   . Coronary artery disease Brother   . Hypertension Brother   . Colon cancer Neg Hx   . Esophageal cancer Neg Hx   . Rectal cancer Neg Hx   . Stomach cancer Neg Hx   . Pancreatic cancer Neg Hx   . Prostate cancer Neg Hx   . Colon polyps Neg Hx    PE: BP 110/80   Pulse 74   Ht 5' 5"  (1.651 m) Comment: measured  Wt 227 lb (103 kg)   SpO2 97%   BMI 37.77 kg/m  Wt Readings from Last 3 Encounters:  10/07/18 227 lb (103 kg)  10/01/18 226 lb 9.6 oz (102.8 kg)  09/10/18 226 lb (102.5 kg)   Constitutional: overweight, in NAD Eyes: PERRLA, EOMI, no exophthalmos ENT: moist mucous membranes, no thyromegaly, no cervical lymphadenopathy Cardiovascular: RRR, No MRG Respiratory: CTA B Gastrointestinal: abdomen soft, NT, ND, BS+ Musculoskeletal: no deformities, strength intact in all 4 Skin: moist, warm, no rashes Neurological: no tremor with outstretched hands, DTR normal in all 4  ASSESSMENT: 1. Graves ds.  2.  Prediabetes  PLAN:  1. Patient with history of Graves' disease, initially was few thyrotoxic symptoms:  Weight loss, some heat intolerance, tremors, palpitations, all improved after starting methimazole.  We were able to decrease the dose of methimazole to 2.5 mg daily and then stopped in 10/2017.  She did very well since then, but in the last month she started to be more fatigued.  She also works nights and her fatigue could be related to this.  She also developed more palpitations recently and wore a heart monitor.  This showed some irregularity but very short-lived and without any significant rhythm changes during episodes of chest pain.  She was cleared by cardiology but advised to continue her beta-blocker.  She is still on 12.5 mg atenolol daily. At last visit, she was telling me that she  developed chest pain as soon as she stopped the medication and this was relieved within 30 minutes of taking a dose of atenolol.  ? placebo component.  Of note, she also had a cardiac catheterization that did not show obstructions. -Reviewed latest TFTs and these were normal 07/2018, but trending down in the last 2 years.  We will repeat these today. -At last visit we checked her TSI antibodies and these were now undetectable.  We discussed that patients with low TSI antibodies are more likely to be able to come off methimazole. There is no need for RAI treatment or thyroidectomy since she responded well to methimazole.  -We will recheck her TFTs now I will have her return in a year for repeat.  We will continue methimazole 2.5 mg for now and aim to stop methimazole if the TSH trends up.  2.  Prediabetes -Patient wanted me to address this today.  She is surprised and upset about her type 2 diabetes appearing in the chart.  Reviewing her HbA1c levels dating back 7 years, she does appear to have prediabetes but no clear type 2 diabetes.  She appears relieved.  I will change this on her medication list.  Office Visit on 10/07/2018  Component Date Value Ref Range Status  . TSH 10/07/2018 0.87  0.35 - 4.50 uIU/mL Final  .  T3, Free 10/07/2018 3.1  2.3 - 4.2 pg/mL Final  . Free T4 10/07/2018 0.76  0.60 - 1.60 ng/dL Final   Comment: Specimens from patients who are undergoing biotin therapy and /or ingesting biotin supplements may contain high levels of biotin.  The higher biotin concentration in these specimens interferes with this Free T4 assay.  Specimens that contain high levels  of biotin may cause false high results for this Free T4 assay.  Please interpret results in light of the total clinical presentation of the patient.     Normal TFTs.  Continue the current dose of methimazole.  Philemon Kingdom, MD PhD [facility]

## 2018-10-07 NOTE — Patient Instructions (Signed)
Please stop at the lab.  Please continue off methimazole.  Please come back for a follow-up appointment in 1 year.

## 2018-10-08 ENCOUNTER — Telehealth: Payer: Self-pay | Admitting: Podiatry

## 2018-10-08 NOTE — Telephone Encounter (Signed)
Called pt with orthotic benefits and she is wanting to proceed. She was scheduled to see Dr Jacqualyn Posey on 3.12.20 so I added her to Rick's schedule as well.

## 2018-10-09 DIAGNOSIS — M47892 Other spondylosis, cervical region: Secondary | ICD-10-CM | POA: Diagnosis not present

## 2018-10-09 DIAGNOSIS — M542 Cervicalgia: Secondary | ICD-10-CM | POA: Diagnosis not present

## 2018-10-13 NOTE — Progress Notes (Signed)
Subjective:   Patient ID: Emily Osborne, female   DOB: 58 y.o.   MRN: [id number]   HPI 58 year old female presents the office today for concerns of pain to the outside aspect the left ankle.  She describes a nagging discomfort as well as tightness and stiffness but she is not seeing any swelling but she feels that there is swelling on the inside.  This is been on for the last 6 months and she denies any recent injury or trauma to her foot.  The area of the pain that she was experiencing I last saw her did resolve.   Review of Systems  All other systems reviewed and are negative.  Past Medical History:  Diagnosis Date  . Allergy   . Anxiety   . Asthma    pt has inhaler  . Back pain   . C. difficile colitis   . Cataract    bil eyes  . Chest pain   . Chronic fatigue syndrome   . Clostridium difficile colitis 12/13/2016  . Diverticulitis   . Dyspnea   . Dysrhythmia    palpitations  . Edema   . Elevated blood pressure reading without diagnosis of hypertension    "just elevated when I'm in pain" (12/07/2015)  . Fatty liver   . Fibromyalgia   . Ganglion of joint    right wrist  . GERD (gastroesophageal reflux disease)   . H. pylori infection   . Headache    hx of  . Helicobacter pylori (H. pylori) infection 12/13/2016  . Herpes zoster without mention of complication   . HTN (hypertension)   . Hyperlipidemia   . Hyperthyroidism   . IBS (irritable bowel syndrome)   . Leg edema   . Multiple drug allergies 06/18/2017  . Myalgia   . Nontraumatic rupture of Achilles tendon   . Other acute reactions to stress   . Pain in joint, shoulder region   . Pain in joint, upper arm   . Pain in limb   . Palpitations   . Personal history of other diseases of digestive system   . Pneumonia    once  . PONV (postoperative nausea and vomiting)   . Routine screening for STI (sexually transmitted infection) 12/13/2016  . S/P partial colectomy 06/18/2017  . Thyroid disease    hyperthyroidism   . Type 2 diabetes mellitus with hyperglycemia, without long-term current use of insulin ([place]) 08/07/2018  . Vitamin B 12 deficiency     Past Surgical History:  Procedure Laterality Date  . ABDOMINAL HYSTERECTOMY    . ACHILLES TENDON REPAIR Right 2005  . BUNIONECTOMY Bilateral    Bunionectomy 1983  . COLON SURGERY  01/23/2017   6 to 8 inches sigmoid colon removed  . COLONOSCOPY    . GANGLION CYST EXCISION Right    wrist  . LEFT HEART CATH AND CORONARY ANGIOGRAPHY N/A 10/03/2016   Procedure: Left Heart Cath and Coronary Angiography;  Surgeon: Burnell Blanks, MD;  Location: [facility];  Service: Cardiovascular;  Laterality: N/A;  . MENISCUS REPAIR Right 04/2014  . POLYPECTOMY    . TUBAL LIGATION    . wisdomteeth extraction       Current Outpatient Medications:  .  acetaminophen (TYLENOL) 500 MG tablet, Take 1,000 mg by mouth every 6 (six) hours as needed for moderate pain. , Disp: , Rfl:  .  albuterol (PROAIR HFA) 108 (90 Base) MCG/ACT inhaler, USE 2 PUFFS EVERY 4 HOURS AS NEEDED FOR COUGH,  WHEEZE OR SHORTNESS OF BREATH., Disp: 8.5 each, Rfl: 2 .  ALPRAZolam (XANAX) 0.25 MG tablet, Take 1 tablet (0.25 mg total) by mouth 3 (three) times daily as needed., Disp: 30 tablet, Rfl: 2 .  atenolol (TENORMIN) 25 MG tablet, TAKE 1/2 TABLETS (12.5 MG TOTAL) BY MOUTH DAILY, Disp: 45 tablet, Rfl: 3 .  azelastine (OPTIVAR) 0.05 % ophthalmic solution, Place 1 drop into both eyes 2 (two) times daily as needed., Disp: , Rfl:  .  Blood Glucose Monitoring Suppl (FREESTYLE FREEDOM) KIT, 1 Device by Does not apply route daily., Disp: 1 each, Rfl: 0 .  dexlansoprazole (DEXILANT) 60 MG capsule, Take 1 capsule (60 mg total) by mouth daily., Disp: 90 capsule, Rfl: 0 .  fexofenadine (ALLEGRA) 180 MG tablet, Take 1 tablet (180 mg total) by mouth daily., Disp: 90 tablet, Rfl: 3 .  Flaxseed, Linseed, (FLAXSEED OIL PO), Take by mouth. One capsule daily, Disp: , Rfl:  .  fluconazole (DIFLUCAN) 150 MG  tablet, 1 po x1, may repeat in 3 days prn, Disp: 2 tablet, Rfl: 0 .  fluticasone (FLONASE) 50 MCG/ACT nasal spray, PLACE 2 SPRAYS INTO BOTH NOSTRILS DAILY., Disp: 16 g, Rfl: 4 .  Fluticasone-Salmeterol (ADVAIR DISKUS) 250-50 MCG/DOSE AEPB, TAKE 1 PUFF BY MOUTH TWICE A DAY, Disp: 60 each, Rfl: 2 .  FREESTYLE LITE test strip, USE TO CHECK BLOOD SUGAR ONCE A DAY, Disp: 100 each, Rfl: 0 .  Iron Combinations (I.L.X. B-12) ELIX, Take 1 each by mouth daily as needed (energy). 1 droplet full, Disp: , Rfl:  .  lidocaine (LIDODERM) 5 %, Apply to affected area QD PRN for a Week, Disp: 30 patch, Rfl: 0 .  Multiple Vitamins-Minerals (MULTIVITAMIN WITH MINERALS) tablet, Take 1 tablet by mouth daily., Disp: , Rfl:  .  valACYclovir (VALTREX) 1000 MG tablet, Take 1,000 mg by mouth 3 (three) times daily as needed., Disp: , Rfl:  .  diclofenac sodium (VOLTAREN) 1 % GEL, Apply 2 g topically 4 (four) times daily. Rub into affected area of foot 2 to 4 times daily, Disp: 100 g, Rfl: 2 .  metFORMIN (GLUCOPHAGE) 500 MG tablet, Take 1 tablet (500 mg total) by mouth daily with breakfast., Disp: 30 tablet, Rfl: 0 .  Vitamin D, Ergocalciferol, (DRISDOL) 1.25 MG (50000 UT) CAPS capsule, Take 1 capsule (50,000 Units total) by mouth every 7 (seven) days., Disp: 4 capsule, Rfl: 0  Allergies  Allergen Reactions  . Aspirin Other (See Comments)    REACTION: anaphylaxis  . Gentamicin Other (See Comments)    Eye drops turned the sclera bright red  . Ibuprofen Other (See Comments)    REACTION: anaphylaxsis  . Metronidazole Swelling    REACTION: red face/swelling  . Nsaids Other (See Comments)    REACTION: anaphylaxis  . Doxycycline Other (See Comments)    REACTION: severe nausea/vomiting  . Influenza A (H1n1) Monoval Vac Other (See Comments)    REACTION: sick for 3 weeks  . Loratadine Other (See Comments)    Fatigue/weakness  . Periactin [Cyproheptadine] Other (See Comments)    paranoid   . Ranitidine Hcl Other (See  Comments)    REACTION: Lips turned red /peel  . Sulfamethoxazole-Trimethoprim Other (See Comments)    REACTION: face red/peel  . Tramadol Hcl Other (See Comments)    REACTION: paranoid         Objective:  Physical Exam  General: AAO x3, NAD  Dermatological: Skin is warm, dry and supple bilateral. Nails x 10 are well manicured; remaining  integument appears unremarkable at this time. There are no open sores, no preulcerative lesions, no rash or signs of infection present.  Vascular: Dorsalis Pedis artery and Posterior Tibial artery pedal pulses are 2/4 bilateral with immedate capillary fill time. Pedal hair growth present. No varicosities and no lower extremity edema present bilateral. There is no pain with calf compression, swelling, warmth, erythema.   Neruologic: Grossly intact via light touch bilateral.  Protective threshold with Semmes Wienstein monofilament intact to all pedal sites bilateral.   Musculoskeletal: There is mild tenderness palpation course the peroneal tendons just posterior and inferior to the lateral malleolus along the insertion of the fifth metatarsal base.  Also tenderness palpation of the fourth and fifth metatarsal cuboid joint there is no specific area pinpoint any tenderness or pain to vibratory sensation.  There is minimal edema to this area compared to contralateral extremity.  No pain ankle joint, subtalar joint.  No other areas of tenderness elicited.  Achilles tendon, plantar fascial, flexor, extensor tendons all appear to be intact.  Muscular strength 5/5 in all groups tested bilateral.  Gait: Unassisted, Nonantalgic.       Assessment:   Peroneal tendinitis left ankle with capsulitis    Plan:  -Treatment options discussed including all alternatives, risks, and complications -Etiology of symptoms were discussed -X-rays were obtained and reviewed with the patient.  No evidence of acute fracture or stress fracture. -Prescribed Voltaren gel. -Tri-Lock  ankle brace dispensed. -Once the symptoms start to improve we can start some rehab exercises. -If no improvement in symptoms with MRI to rule out partial tearing of the peroneal tendon.  Return in about 4 weeks (around 10/30/2018).  Trula Slade DPM

## 2018-10-14 ENCOUNTER — Telehealth: Payer: Self-pay | Admitting: Gastroenterology

## 2018-10-14 DIAGNOSIS — M542 Cervicalgia: Secondary | ICD-10-CM | POA: Diagnosis not present

## 2018-10-14 DIAGNOSIS — M47892 Other spondylosis, cervical region: Secondary | ICD-10-CM | POA: Diagnosis not present

## 2018-10-14 NOTE — Telephone Encounter (Signed)
Pts procedure was RS to 330 pm 2-27 due to adding an EGD. Pt and I went over prep instructions- to arrive at 230 p for a 330 p Double procedure Thursday - explained to her That WED 2-26  There are no changes- liquids only WED, some with sugar to maintain her blood sugar  - 1st prep 6 pm followed by 2 16 oz cups water , liquids until bedtime. Instructed 2-27 Thursday , clears only in the morning until 1030 am when she does Suprep 2, followed by 2- 16 oz waters and liquids until 1230p- NO LIQUIDS after 1230 pm - meds by 1230 pm except Metformin- no metformin Thursday - encouraged her to drink liquids that have some sugar to help maintain her blood sugar - pt verbalized understanding of new instructions - encouraged to call with further questions  Lelan Pons PV

## 2018-10-16 ENCOUNTER — Ambulatory Visit (AMBULATORY_SURGERY_CENTER): Payer: 59 | Admitting: Gastroenterology

## 2018-10-16 ENCOUNTER — Encounter: Payer: Self-pay | Admitting: Gastroenterology

## 2018-10-16 ENCOUNTER — Encounter: Payer: 59 | Admitting: Gastroenterology

## 2018-10-16 VITALS — BP 126/76 | HR 73 | Temp 96.0°F | Resp 12 | Ht 65.0 in | Wt 227.0 lb

## 2018-10-16 DIAGNOSIS — R1013 Epigastric pain: Secondary | ICD-10-CM | POA: Diagnosis not present

## 2018-10-16 DIAGNOSIS — K2970 Gastritis, unspecified, without bleeding: Secondary | ICD-10-CM

## 2018-10-16 DIAGNOSIS — K621 Rectal polyp: Secondary | ICD-10-CM | POA: Diagnosis not present

## 2018-10-16 DIAGNOSIS — E119 Type 2 diabetes mellitus without complications: Secondary | ICD-10-CM | POA: Diagnosis not present

## 2018-10-16 DIAGNOSIS — Z86010 Personal history of colonic polyps: Secondary | ICD-10-CM | POA: Diagnosis not present

## 2018-10-16 DIAGNOSIS — K449 Diaphragmatic hernia without obstruction or gangrene: Secondary | ICD-10-CM | POA: Diagnosis not present

## 2018-10-16 DIAGNOSIS — D125 Benign neoplasm of sigmoid colon: Secondary | ICD-10-CM

## 2018-10-16 DIAGNOSIS — J45909 Unspecified asthma, uncomplicated: Secondary | ICD-10-CM | POA: Diagnosis not present

## 2018-10-16 DIAGNOSIS — D128 Benign neoplasm of rectum: Secondary | ICD-10-CM

## 2018-10-16 DIAGNOSIS — D127 Benign neoplasm of rectosigmoid junction: Secondary | ICD-10-CM

## 2018-10-16 DIAGNOSIS — K2950 Unspecified chronic gastritis without bleeding: Secondary | ICD-10-CM | POA: Diagnosis not present

## 2018-10-16 DIAGNOSIS — I10 Essential (primary) hypertension: Secondary | ICD-10-CM | POA: Diagnosis not present

## 2018-10-16 DIAGNOSIS — K635 Polyp of colon: Secondary | ICD-10-CM | POA: Diagnosis not present

## 2018-10-16 MED ORDER — SODIUM CHLORIDE 0.9 % IV SOLN: 500.0000 mL | Freq: Once | INTRAVENOUS | Status: DC

## 2018-10-16 NOTE — Op Note (Signed)
[facility] Patient Name: Emily Osborne Procedure Date: 10/16/2018 3:59 PM MRN: [id number] Endoscopist: Viviann Spare P. Adela Lank , MD Age: 58 Referring MD:  Date of Birth: 1961-08-15 Gender: Female Account #: [id number] Procedure:                Colonoscopy Indications:              Surveillance: Personal history of adenomatous                            polyps on last colonoscopy 3 years ago Medicines:                Monitored Anesthesia Care Procedure:                Pre-Anesthesia Assessment:                           - Prior to the procedure, a History and Physical                            was performed, and patient medications and                            allergies were reviewed. The patient's tolerance of                            previous anesthesia was also reviewed. The risks                            and benefits of the procedure and the sedation                            options and risks were discussed with the patient.                            All questions were answered, and informed consent                            was obtained. Prior Anticoagulants: The patient has                            taken no previous anticoagulant or antiplatelet                            agents. ASA Grade Assessment: II - A patient with                            mild systemic disease. After reviewing the risks                            and benefits, the patient was deemed in                            satisfactory condition to undergo the procedure.  After obtaining informed consent, the colonoscope                            was passed under direct vision. Throughout the                            procedure, the patient's blood pressure, pulse, and                            oxygen saturations were monitored continuously. The                            Colonoscope was introduced through the anus and                            advanced to the the  cecum, identified by                            appendiceal orifice and ileocecal valve. The                            colonoscopy was performed without difficulty. The                            patient tolerated the procedure well. The quality                            of the bowel preparation was adequate. Scope In: 4:23:05 PM Scope Out: 4:39:52 PM Scope Withdrawal Time: 0 hours 10 minutes 45 seconds  Total Procedure Duration: 0 hours 16 minutes 47 seconds  Findings:                 The perianal and digital rectal examinations were                            normal.                           A 3 mm polyp was found in the sigmoid colon. The                            polyp was sessile. The polyp was removed with a                            cold snare. Resection and retrieval were complete.                           A diminutive polyp was found in the recto-sigmoid                            colon. The polyp was sessile. The polyp was removed                            with a cold snare. Resection and retrieval  were                            complete.                           A 3 mm polyp was found in the rectum. The polyp was                            sessile. The polyp was removed with a cold snare.                            Resection and retrieval were complete.                           Multiple medium-mouthed diverticula were found in                            the entire colon.                           There was evidence of a prior end-to-end                            colo-colonic anastomosis in the sigmoid colon. This                            was characterized by healthy appearing mucosa.                           The exam was otherwise without abnormality. Complications:            No immediate complications. Estimated blood loss:                            Minimal. Estimated Blood Loss:     Estimated blood loss was minimal. Impression:               - One 3 mm polyp  in the sigmoid colon, removed with                            a cold snare. Resected and retrieved.                           - One diminutive polyp at the recto-sigmoid colon,                            removed with a cold snare. Resected and retrieved.                           - One 3 mm polyp in the rectum, removed with a cold                            snare. Resected and retrieved.                           -  Diverticulosis in the entire examined colon.                           - End-to-end colo-colonic anastomosis,                            characterized by healthy appearing mucosa.                           - The examination was otherwise normal. Recommendation:           - Patient has a contact number available for                            emergencies. The signs and symptoms of potential                            delayed complications were discussed with the                            patient. Return to normal activities tomorrow.                            Written discharge instructions were provided to the                            patient.                           - Resume previous diet.                           - Continue present medications.                           - Await pathology results. Viviann Spare P. Amaliya Whitelaw, MD 10/16/2018 4:50:57 PM This report has been signed electronically.

## 2018-10-16 NOTE — Progress Notes (Signed)
Pt's states no medical or surgical changes since previsit or office visit. 

## 2018-10-16 NOTE — Progress Notes (Signed)
Called to room to assist during endoscopic procedure.  Patient ID and intended procedure confirmed with present staff. Received instructions for my participation in the procedure from the performing physician.  

## 2018-10-16 NOTE — Patient Instructions (Signed)
Information on polyps given to you today.  Await pathology results.  YOU HAD AN ENDOSCOPIC PROCEDURE TODAY AT [facility]:   Refer to the procedure report that was given to you for any specific questions about what was found during the examination.  If the procedure report does not answer your questions, please call your gastroenterologist to clarify.  If you requested that your care partner not be given the details of your procedure findings, then the procedure report has been included in a sealed envelope for you to review at your convenience later.  YOU SHOULD EXPECT: Some feelings of bloating in the abdomen. Passage of more gas than usual.  Walking can help get rid of the air that was put into your GI tract during the procedure and reduce the bloating. If you had a lower endoscopy (such as a colonoscopy or flexible sigmoidoscopy) you may notice spotting of blood in your stool or on the toilet paper. If you underwent a bowel prep for your procedure, you may not have a normal bowel movement for a few days.  Please Note:  You might notice some irritation and congestion in your nose or some drainage.  This is from the oxygen used during your procedure.  There is no need for concern and it should clear up in a day or so.  SYMPTOMS TO REPORT IMMEDIATELY:   Following lower endoscopy (colonoscopy or flexible sigmoidoscopy):  Excessive amounts of blood in the stool  Significant tenderness or worsening of abdominal pains  Swelling of the abdomen that is new, acute  Fever of 100F or higher   Following upper endoscopy (EGD)  Vomiting of blood or coffee ground material  New chest pain or pain under the shoulder blades  Painful or persistently difficult swallowing  New shortness of breath  Fever of 100F or higher  Black, tarry-looking stools  For urgent or emergent issues, a gastroenterologist can be reached at any hour by calling [phone].   DIET:  We do recommend a  small meal at first, but then you may proceed to your regular diet.  Drink plenty of fluids but you should avoid alcoholic beverages for 24 hours.  ACTIVITY:  You should plan to take it easy for the rest of today and you should NOT DRIVE or use heavy machinery until tomorrow (because of the sedation medicines used during the test).    FOLLOW UP: Our staff will call the number listed on your records the next business day following your procedure to check on you and address any questions or concerns that you may have regarding the information given to you following your procedure. If we do not reach you, we will leave a message.  However, if you are feeling well and you are not experiencing any problems, there is no need to return our call.  We will assume that you have returned to your regular daily activities without incident.  If any biopsies were taken you will be contacted by phone or by letter within the next 1-3 weeks.  Please call us at [phone] if you have not heard about the biopsies in 3 weeks.    SIGNATURES/CONFIDENTIALITY: You and/or your care partner have signed paperwork which will be entered into your electronic medical record.  These signatures attest to the fact that that the information above on your After Visit Summary has been reviewed and is understood.  Full responsibility of the confidentiality of this discharge information lies with you and/or your care-partner.

## 2018-10-16 NOTE — Progress Notes (Signed)
Report given to PACU, vss 

## 2018-10-16 NOTE — Op Note (Signed)
[facility] Patient Name: Emily Osborne Procedure Date: 10/16/2018 3:59 PM MRN: [id number] Endoscopist: Remo Lipps P. Havery Moros , MD Age: 58 Referring MD:  Date of Birth: 14-Sep-1960 Gender: Female Account #: [id number] Procedure:                Upper GI endoscopy Indications:              postprandial epigastric abdominal pain, on                            Dexilant, history of refractory H pylori x 3                            different therapies until eradication, symptoms                            have since recurred. Medicines:                Monitored Anesthesia Care Procedure:                Pre-Anesthesia Assessment:                           - Prior to the procedure, a History and Physical                            was performed, and patient medications and                            allergies were reviewed. The patient's tolerance of                            previous anesthesia was also reviewed. The risks                            and benefits of the procedure and the sedation                            options and risks were discussed with the patient.                            All questions were answered, and informed consent                            was obtained. Prior Anticoagulants: The patient has                            taken no previous anticoagulant or antiplatelet                            agents. ASA Grade Assessment: II - A patient with                            mild systemic disease. After reviewing the risks  and benefits, the patient was deemed in                            satisfactory condition to undergo the procedure.                           After obtaining informed consent, the endoscope was                            passed under direct vision. Throughout the                            procedure, the patient's blood pressure, pulse, and                            oxygen saturations were monitored  continuously. The                            Model GIF-HQ190 [id number]) scope was introduced                            through the mouth, and advanced to the second part                            of duodenum. The upper GI endoscopy was                            accomplished without difficulty. The patient                            tolerated the procedure well. Scope In: Scope Out: Findings:                 Esophagogastric landmarks were identified: the                            Z-line was found at 37 cm, the gastroesophageal                            junction was found at 37 cm and the upper extent of                            the gastric folds was found at 38 cm from the                            incisors.                           A 1 cm hiatal hernia was present.                           The exam of the esophagus was otherwise normal.                           Diffuse mildly erythematous  mucosa was found in the                            gastric fundus and in the gastric body, no focal                            ulcerations.                           The exam of the stomach was otherwise normal.                           Biopsies were taken with a cold forceps in the                            gastric body, at the incisura and in the gastric                            antrum for Helicobacter pylori testing.                           The duodenal bulb and second portion of the                            duodenum were normal. Complications:            No immediate complications. Estimated blood loss:                            Minimal. Estimated Blood Loss:     Estimated blood loss was minimal. Impression:               - Esophagogastric landmarks identified.                           - 1 cm hiatal hernia.                           - Normal esophagus                           - Erythematous mucosa in the gastric fundus and                            gastric body.                            - Normal stomach otherwise - biopsies taken to rule                            out H pylori                           - Normal duodenal bulb and second portion of the                            duodenum. Recommendation:           -  Patient has a contact number available for                            emergencies. The signs and symptoms of potential                            delayed complications were discussed with the                            patient. Return to normal activities tomorrow.                            Written discharge instructions were provided to the                            patient.                           - Resume previous diet.                           - Continue present medications.                           - Await pathology results.                           - If biopsies negative consideration for [place] to                            ensure no gallstones. May otherwise consider                            alternative regimens to treat dyspepsia Remo Lipps P. Havery Moros, MD 10/16/2018 4:58:57 PM This report has been signed electronically.

## 2018-10-17 ENCOUNTER — Telehealth: Payer: Self-pay

## 2018-10-17 DIAGNOSIS — M47892 Other spondylosis, cervical region: Secondary | ICD-10-CM | POA: Diagnosis not present

## 2018-10-17 DIAGNOSIS — M542 Cervicalgia: Secondary | ICD-10-CM | POA: Diagnosis not present

## 2018-10-17 MED FILL — FLUTICASONE PROP 50 MCG SPR: 50 | 30 days supply | Qty: 16 | Fill #1

## 2018-10-17 MED FILL — ADVAIR 250/50 DISKUS: 250-50 | 30 days supply | Qty: 60 | Fill #1

## 2018-10-17 NOTE — Telephone Encounter (Signed)
  Follow up Call-  Call back number 10/16/2018 06/04/2017 05/17/2016  Post procedure Call Back phone  # [phone] cell [phone] cell  Permission to leave phone message Yes Yes Yes  Some recent data might be hidden     No ID on answering machine No message left

## 2018-10-17 NOTE — Telephone Encounter (Signed)
  Follow up Call-  Call back number 10/16/2018 06/04/2017 05/17/2016  Post procedure Call Back phone  # [phone] cell [phone] cell  Permission to leave phone message Yes Yes Yes  Some recent data might be hidden     Patient questions:  Do you have a fever, pain , or abdominal swelling? No. Pain Score  0 *  Have you tolerated food without any problems? Yes.    Have you been able to return to your normal activities? Yes.    Do you have any questions about your discharge instructions: Diet   No. Medications  No. Follow up visit  No.  Do you have questions or concerns about your Care? No.  Actions: * If pain score is 4 or above: No action needed, pain <4.

## 2018-10-22 ENCOUNTER — Ambulatory Visit (INDEPENDENT_AMBULATORY_CARE_PROVIDER_SITE_OTHER): Payer: Self-pay | Admitting: Family Medicine

## 2018-10-22 ENCOUNTER — Encounter (INDEPENDENT_AMBULATORY_CARE_PROVIDER_SITE_OTHER): Payer: Self-pay

## 2018-10-22 ENCOUNTER — Encounter (INDEPENDENT_AMBULATORY_CARE_PROVIDER_SITE_OTHER): Payer: Self-pay | Admitting: Family Medicine

## 2018-10-23 DIAGNOSIS — M47892 Other spondylosis, cervical region: Secondary | ICD-10-CM | POA: Diagnosis not present

## 2018-10-23 DIAGNOSIS — M542 Cervicalgia: Secondary | ICD-10-CM | POA: Diagnosis not present

## 2018-10-28 ENCOUNTER — Telehealth: Payer: Self-pay | Admitting: Gastroenterology

## 2018-10-28 DIAGNOSIS — R1011 Right upper quadrant pain: Secondary | ICD-10-CM

## 2018-10-28 NOTE — Telephone Encounter (Signed)
The patient has been notified of this information and all questions answered. The pt has been advised of the information and verbalized understanding.      You have been scheduled for an abdominal ultrasound at [facility] (1st floor of hospital) on 11/04/18 at 10 am. Please arrive 15 minutes prior to your appointment for registration. Make certain not to have anything to eat or drink 6 hours prior to your appointment. Should you need to reschedule your appointment, please contact radiology at [phone]. This test typically takes about 30 minutes to perform.

## 2018-10-28 NOTE — Telephone Encounter (Signed)
Pt reported she is sick every other day and is ready to schedule an [place] of gall bladder.

## 2018-10-28 NOTE — Telephone Encounter (Signed)
Beth can you help relay the following: - EGD showed NO evidence of H pylori, great news. If her upper abdominal pain after eating persists, recommend RUQ [place] to rule out gallstones if you can coordinate - Colonoscopy showed a few benign polyps, no adenomas. Given she had numerous adenomas on her last colonoscopy, repeat colonoscopy in 5 years.

## 2018-10-30 ENCOUNTER — Ambulatory Visit: Payer: 59 | Admitting: Orthotics

## 2018-10-30 ENCOUNTER — Ambulatory Visit: Payer: 59 | Admitting: Podiatry

## 2018-10-30 ENCOUNTER — Other Ambulatory Visit: Payer: Self-pay

## 2018-10-30 DIAGNOSIS — M7672 Peroneal tendinitis, left leg: Secondary | ICD-10-CM

## 2018-10-30 DIAGNOSIS — M779 Enthesopathy, unspecified: Secondary | ICD-10-CM

## 2018-10-30 NOTE — Progress Notes (Signed)
Dr Jacqualyn Posey wants to wait 3 weeks before casting her.

## 2018-10-30 NOTE — Progress Notes (Signed)
Subjective: 58 year old female presents the office today for follow-up evaluation of pain to the outside aspect of her left foot and ankle.  She states that she did purchase new shoes. She got Brooks Ghost 12 11 very helpful for her.  She still gets some discomfort she points more to the fourth, fifth metatarsal cuboid joint.  Denies any recent injury or trauma she has no other concerns today.  The Voltaren gel was helpful and the brace was helpful but she stopped wearing it. Denies any systemic complaints such as fevers, chills, nausea, vomiting. No acute changes since last appointment, and no other complaints at this time.   Objective: AAO x3, NAD DP/PT pulses palpable bilaterally, CRT less than 3 seconds Tenderness mostly to the fourth, fifth metatarsal cuboid joint on the lateral aspect.  No significant discomfort in the course the peroneal tendons today.  No edema, erythema.  No other areas of tenderness.  MMT 5/5. No open lesions or pre-ulcerative lesions.  No pain with calf compression, swelling, warmth, erythema  Assessment: Tendinitis, capsulitis left foot  Plan: -All treatment options discussed with the patient including all alternatives, risks, complications.  -Steroid injection was performed.  Skin was cleaned with alcohol.  1 cc dexamethasone phosphate followed by 1 cc Marcaine plain was infiltrated on the area of maximal tenderness the lateral aspect of the foot.  Postinjection care was discussed. -Continue with supportive shoes.  Continue with ice as well as Voltaren gel. -Patient encouraged to call the office with any questions, concerns, change in symptoms.   Follow-up in 4 to 6 weeks if needed.  That time if we need to we will do custom inserts.  For now she is to continue with over-the-counter inserts that are modified.  Trula Slade DPM

## 2018-10-31 ENCOUNTER — Encounter: Payer: 59 | Admitting: Gastroenterology

## 2018-10-31 DIAGNOSIS — M542 Cervicalgia: Secondary | ICD-10-CM | POA: Diagnosis not present

## 2018-10-31 DIAGNOSIS — M47892 Other spondylosis, cervical region: Secondary | ICD-10-CM | POA: Diagnosis not present

## 2018-11-03 ENCOUNTER — Encounter: Payer: Self-pay | Admitting: Family Medicine

## 2018-11-04 ENCOUNTER — Ambulatory Visit (HOSPITAL_COMMUNITY)
Admission: RE | Admit: 2018-11-04 | Discharge: 2018-11-04 | Disposition: A | Discharge status: Home / Self Care | Payer: 59 | Source: Ambulatory Visit | Attending: Gastroenterology | Admitting: Gastroenterology

## 2018-11-04 ENCOUNTER — Other Ambulatory Visit: Payer: Self-pay

## 2018-11-04 DIAGNOSIS — R1011 Right upper quadrant pain: Secondary | ICD-10-CM | POA: Insufficient documentation

## 2018-11-04 DIAGNOSIS — R1010 Upper abdominal pain, unspecified: Secondary | ICD-10-CM | POA: Diagnosis not present

## 2018-11-12 ENCOUNTER — Encounter: Payer: Self-pay | Admitting: Family Medicine

## 2018-11-12 DIAGNOSIS — J4520 Mild intermittent asthma, uncomplicated: Secondary | ICD-10-CM

## 2018-11-12 MED ORDER — FLUTICASONE-SALMETEROL 250-50 MCG/DOSE IN AEPB: 1.0000 | INHALATION_SPRAY | Freq: Two times a day (BID) | RESPIRATORY_TRACT | 1 refills | Status: DC

## 2018-11-12 MED FILL — ADVAIR 250/50 DISKUS: 250-50 | 90 days supply | Qty: 180 | Fill #0

## 2018-11-13 ENCOUNTER — Encounter (INDEPENDENT_AMBULATORY_CARE_PROVIDER_SITE_OTHER): Payer: Self-pay

## 2018-11-13 ENCOUNTER — Other Ambulatory Visit: Payer: Self-pay

## 2018-11-13 ENCOUNTER — Encounter (INDEPENDENT_AMBULATORY_CARE_PROVIDER_SITE_OTHER): Payer: Self-pay | Admitting: Family Medicine

## 2018-11-13 ENCOUNTER — Ambulatory Visit (INDEPENDENT_AMBULATORY_CARE_PROVIDER_SITE_OTHER): Payer: 59 | Admitting: Family Medicine

## 2018-11-13 DIAGNOSIS — E559 Vitamin D deficiency, unspecified: Secondary | ICD-10-CM | POA: Diagnosis not present

## 2018-11-13 DIAGNOSIS — Z6837 Body mass index (BMI) 37.0-37.9, adult: Secondary | ICD-10-CM

## 2018-11-13 DIAGNOSIS — R7303 Prediabetes: Secondary | ICD-10-CM | POA: Diagnosis not present

## 2018-11-13 MED ORDER — VITAMIN D (ERGOCALCIFEROL) 1.25 MG (50000 UNIT) PO CAPS: 50000.0000 [IU] | ORAL_CAPSULE | ORAL | 0 refills | Status: DC

## 2018-11-13 MED ORDER — METFORMIN HCL 500 MG PO TABS: 500.0000 mg | ORAL_TABLET | Freq: Every day | ORAL | 0 refills | Status: DC

## 2018-11-13 MED FILL — metFORMIN HCL 500 MG TABS: 500 | 30 days supply | Qty: 30 | Fill #0

## 2018-11-13 MED FILL — VIT D2 1.25 MG (50,000 UNIT: 1.25 MG | 28 days supply | Qty: 4 | Fill #0

## 2018-11-17 NOTE — Progress Notes (Signed)
Office: [phone]  /  Fax: [phone] TeleHealth Visit:  Emily Osborne has consented to this TeleHealth visit today via Face Time. The patient is located at home, the provider is located at the [facility] office. The participants in this visit include the listed provider and patient.   HPI:   Chief Complaint: OBESITY Emily Osborne is here to discuss her progress with her obesity treatment plan. She is on the Category 2 plan and is following her eating plan approximately 10 % of the time. She states she is exercising for 30 minutes 1 time per week. Emily Osborne is a new patient to me as she usually sees Dr. Adair Patter or Linus Orn. Her last visit in our clinic was over 8 weeks ago and she was asked to follow up in 2 weeks. She thinks that she has maintained her weight, but she has not done well with weight loss in our program over the last year. She notes increased cravings, emotional eating, and she is not eating much protein or vegetables. Emily Osborne is mostly eating simple carbs like sweets and chips.  We were unable to weight the patient today for this TeleHealth visit. She feels as if she has maintained weight since her last visit. She has lost 0 lbs since starting treatment with [place].  Pre-Diabetes Emily Osborne has a diagnosis of pre-diabetes based on her elevated Hgb A1c and was informed this puts her at greater risk of developing diabetes. She is taking metformin on and off and she is not getting much benefit. She is struggling with following her diet prescription and notes increased polyphagia, especially for simple carbs.  Vitamin D Deficiency Emily Osborne has a diagnosis of vitamin D deficiency. She is currently on vit D, but is not yet at goal. Emily Osborne denies nausea, vomiting, or muscle weakness.  ASSESSMENT AND PLAN:  Prediabetes - Plan: metFORMIN (GLUCOPHAGE) 500 MG tablet  Vitamin D deficiency - Plan: Vitamin D, Ergocalciferol, (DRISDOL) 1.25 MG (50000 UT) CAPS capsule  Class 2 severe  obesity with serious comorbidity and body mass index (BMI) of 37.0 to 37.9 in adult, unspecified obesity type [facility])  PLAN:  Pre-Diabetes Emily Osborne will continue to work on weight loss, exercise, and decreasing simple carbohydrates in her diet to help decrease the risk of diabetes. She was informed that eating too many simple carbohydrates or too many calories at one sitting increases the likelihood of GI side effects. Emily Osborne agreed to continue metformin 500 mg qAM #30 with no refills and a prescription was written today. Emily Osborne agreed to follow up with [place] as directed to monitor her progress in 3 weeks.  Vitamin D Deficiency Emily Osborne was informed that low vitamin D levels contribute to fatigue and are associated with obesity, breast, and colon cancer. Emily Osborne agrees to continue to take prescription Vit D @50 ,000 IU every week #4 with no refills and will follow up for routine testing of vitamin D, at least 2-3 times per year. She was informed of the risk of over-replacement of vitamin D and agrees to not increase her dose unless she discusses this with [place] first. Emily Osborne agrees to follow up in 3 weeks as directed.  Obesity Emily Osborne is currently in the action stage of change. As such, her goal is to continue with weight loss efforts. She has agreed to follow the Category 2 plan. Emily Osborne has been instructed to work up to a goal of 150 minutes of combined cardio and strengthening exercise per week for weight loss and overall health benefits. We discussed  the following Behavioral Modification Strategies today: increasing lean protein intake, work on meal planning and easy cooking plans, emotional eating strategies, ways to avoid boredom eating, keeping healthy foods in the home, better snacking choices, and ways to avoid night time snacking.  Emily Osborne has agreed to follow up with our clinic in 3 weeks with Dr. Adair Patter. She was informed of the importance of frequent follow up visits to maximize her success with  intensive lifestyle modifications for her multiple health conditions.  ALLERGIES: Allergies  Allergen Reactions  . Aspirin Other (See Comments)    REACTION: anaphylaxis  . Gentamicin Other (See Comments)    Eye drops turned the sclera bright red  . Ibuprofen Other (See Comments)    REACTION: anaphylaxsis  . Metronidazole Swelling    REACTION: red face/swelling  . Nsaids Other (See Comments)    REACTION: anaphylaxis  . Doxycycline Other (See Comments)    REACTION: severe nausea/vomiting  . Influenza A (H1n1) [place] Vac Other (See Comments)    REACTION: sick for 3 weeks  . Loratadine Other (See Comments)    Fatigue/weakness  . Periactin [Cyproheptadine] Other (See Comments)    paranoid   . Ranitidine Hcl Other (See Comments)    REACTION: Lips turned red /peel  . Sulfamethoxazole-Trimethoprim Other (See Comments)    REACTION: face red/peel  . Tramadol Hcl Other (See Comments)    REACTION: paranoid    MEDICATIONS: Current Outpatient Medications on File Prior to Visit  Medication Sig Dispense Refill  . acetaminophen (TYLENOL) 500 MG tablet Take 1,000 mg by mouth every 6 (six) hours as needed for moderate pain.     Marland Kitchen albuterol (PROAIR HFA) 108 (90 Base) MCG/ACT inhaler USE 2 PUFFS EVERY 4 HOURS AS NEEDED FOR COUGH, WHEEZE OR SHORTNESS OF BREATH. 8.5 each 2  . ALPRAZolam (XANAX) 0.25 MG tablet Take 1 tablet (0.25 mg total) by mouth 3 (three) times daily as needed. 30 tablet 2  . atenolol (TENORMIN) 25 MG tablet TAKE 1/2 TABLETS (12.5 MG TOTAL) BY MOUTH DAILY 45 tablet 3  . azelastine (OPTIVAR) 0.05 % ophthalmic solution Place 1 drop into both eyes 2 (two) times daily as needed.    . Blood Glucose Monitoring Suppl (FREESTYLE FREEDOM) KIT 1 Device by Does not apply route daily. 1 each 0  . dexlansoprazole (DEXILANT) 60 MG capsule Take 1 capsule (60 mg total) by mouth daily. 90 capsule 0  . diclofenac sodium (VOLTAREN) 1 % GEL Apply 2 g topically 4 (four) times daily. Rub into  affected area of foot 2 to 4 times daily 100 g 2  . fexofenadine (ALLEGRA) 180 MG tablet Take 1 tablet (180 mg total) by mouth daily. 90 tablet 3  . Flaxseed, Linseed, (FLAXSEED OIL PO) Take by mouth. One capsule daily    . fluconazole (DIFLUCAN) 150 MG tablet 1 po x1, may repeat in 3 days prn (Patient not taking: Reported on 10/16/2018) 2 tablet 0  . fluticasone (FLONASE) 50 MCG/ACT nasal spray PLACE 2 SPRAYS INTO BOTH NOSTRILS DAILY. 16 g 4  . Fluticasone-Salmeterol (ADVAIR DISKUS) 250-50 MCG/DOSE AEPB Inhale 1 puff into the lungs 2 (two) times daily. 180 each 1  . FREESTYLE LITE test strip USE TO CHECK BLOOD SUGAR ONCE A DAY 100 each 0  . Iron Combinations (I.L.X. B-12) ELIX Take 1 each by mouth daily as needed (energy). 1 droplet full    . lidocaine (LIDODERM) 5 % Apply to affected area QD PRN for a Week 30 patch 0  . Multiple  Vitamins-Minerals (MULTIVITAMIN WITH MINERALS) tablet Take 1 tablet by mouth daily.    . valACYclovir (VALTREX) 1000 MG tablet Take 1,000 mg by mouth 3 (three) times daily as needed.     No current facility-administered medications on file prior to visit.     PAST MEDICAL HISTORY: Past Medical History:  Diagnosis Date  . Allergy   . Anxiety   . Asthma    pt has inhaler  . Back pain   . C. difficile colitis   . Cataract    bil eyes  . Chest pain   . Chronic fatigue syndrome   . Clostridium difficile colitis 12/13/2016  . Diverticulitis   . Dyspnea   . Dysrhythmia    palpitations  . Edema   . Elevated blood pressure reading without diagnosis of hypertension    "just elevated when I'm in pain" (12/07/2015)  . Fatty liver   . Fibromyalgia   . Ganglion of joint    right wrist  . GERD (gastroesophageal reflux disease)   . H. pylori infection   . Headache    hx of  . Helicobacter pylori (H. pylori) infection 12/13/2016  . Herpes zoster without mention of complication   . HTN (hypertension)   . Hyperlipidemia   . Hyperthyroidism   . IBS (irritable bowel  syndrome)   . Leg edema   . Multiple drug allergies 06/18/2017  . Myalgia   . Nontraumatic rupture of Achilles tendon   . Other acute reactions to stress   . Pain in joint, shoulder region   . Pain in joint, upper arm   . Pain in limb   . Palpitations   . Personal history of other diseases of digestive system   . Pneumonia    once  . PONV (postoperative nausea and vomiting)   . Routine screening for STI (sexually transmitted infection) 12/13/2016  . S/P partial colectomy 06/18/2017  . Thyroid disease    hyperthyroidism  . Type 2 diabetes mellitus with hyperglycemia, without long-term current use of insulin ([place]) 08/07/2018  . Vitamin B 12 deficiency     PAST SURGICAL HISTORY: Past Surgical History:  Procedure Laterality Date  . ABDOMINAL HYSTERECTOMY    . ACHILLES TENDON REPAIR Right 2005  . BUNIONECTOMY Bilateral    Bunionectomy 1983  . COLON SURGERY  01/23/2017   6 to 8 inches sigmoid colon removed  . COLONOSCOPY    . GANGLION CYST EXCISION Right    wrist  . LEFT HEART CATH AND CORONARY ANGIOGRAPHY N/A 10/03/2016   Procedure: Left Heart Cath and Coronary Angiography;  Surgeon: Burnell Blanks, MD;  Location: [facility];  Service: Cardiovascular;  Laterality: N/A;  . MENISCUS REPAIR Right 04/2014  . POLYPECTOMY    . TUBAL LIGATION    . wisdomteeth extraction      SOCIAL HISTORY: Social History   Tobacco Use  . Smoking status: Never Smoker  . Smokeless tobacco: Never Used  Substance Use Topics  . Alcohol use: No    Alcohol/week: 0.0 standard drinks  . Drug use: No    FAMILY HISTORY: Family History  Problem Relation Age of Onset  . Hypertension Father   . Diabetes Father   . Heart disease Father   . Kidney disease Father        Died, 76  . Hyperlipidemia Father   . Leukemia Brother   . Cancer Brother        myleoblastic anemia  . Hyperlipidemia Mother   . Hypertension  Mother        Died, 26  . Thyroid disease Mother        Thyroid  surgery  . Obesity Mother   . Heart disease Mother   . Pernicious anemia Sister   . Thyroid disease Sister        On thyroid Rx  . Lupus Daughter   . Coronary artery disease Brother   . Hypertension Brother   . Colon cancer Neg Hx   . Esophageal cancer Neg Hx   . Rectal cancer Neg Hx   . Stomach cancer Neg Hx   . Pancreatic cancer Neg Hx   . Prostate cancer Neg Hx   . Colon polyps Neg Hx     ROS: Review of Systems  Gastrointestinal: Negative for nausea and vomiting.  Musculoskeletal:       Negative for muscle weakness.  Endo/Heme/Allergies:       Positive for polyphagia.    PHYSICAL EXAM: Pt in no acute distress  RECENT LABS AND TESTS: BMET    Component Value Date/Time   NA 142 08/07/2018 1208   NA 142 03/20/2018 1046   K 4.0 08/07/2018 1208   CL 104 08/07/2018 1208   CO2 30 08/07/2018 1208   GLUCOSE 92 08/07/2018 1208   BUN 15 08/07/2018 1208   BUN 17 03/20/2018 1046   CREATININE 0.86 08/07/2018 1208   CREATININE 0.97 03/23/2016 1501   CALCIUM 9.5 08/07/2018 1208   GFRNONAA 65 03/20/2018 1046   GFRAA 75 03/20/2018 1046   Lab Results  Component Value Date   HGBA1C 5.8 08/07/2018   HGBA1C 6.0 05/13/2018   HGBA1C 5.6 03/20/2018   HGBA1C 5.8 (H) 12/12/2017   HGBA1C 5.5 01/15/2017   Lab Results  Component Value Date   INSULIN 20.0 03/20/2018   INSULIN 17.6 12/12/2017   CBC    Component Value Date/Time   WBC 5.1 08/07/2018 1208   RBC 4.54 08/07/2018 1208   HGB 13.2 08/07/2018 1208   HGB 13.5 12/12/2017 1102   HCT 40.2 08/07/2018 1208   HCT 41.8 12/12/2017 1102   PLT 245.0 08/07/2018 1208   MCV 88.6 08/07/2018 1208   MCV 89 12/12/2017 1102   MCH 28.6 12/12/2017 1102   MCH 28.9 02/08/2017 0909   MCHC 32.9 08/07/2018 1208   RDW 15.0 08/07/2018 1208   RDW 15.1 12/12/2017 1102   LYMPHSABS 2.3 08/07/2018 1208   LYMPHSABS 2.3 12/12/2017 1102   MONOABS 0.5 08/07/2018 1208   EOSABS 0.1 08/07/2018 1208   EOSABS 0.1 12/12/2017 1102   BASOSABS 0.0  08/07/2018 1208   BASOSABS 0.0 12/12/2017 1102   Iron/TIBC/Ferritin/ %Sat No results found for: IRON, TIBC, FERRITIN, IRONPCTSAT Lipid Panel     Component Value Date/Time   CHOL 192 08/07/2018 1208   CHOL 197 03/20/2018 1046   TRIG 167.0 (H) 08/07/2018 1208   HDL 53.50 08/07/2018 1208   HDL 57 03/20/2018 1046   CHOLHDL 4 08/07/2018 1208   VLDL 33.4 08/07/2018 1208   LDLCALC 105 (H) 08/07/2018 1208   LDLCALC 124 (H) 03/20/2018 1046   LDLDIRECT 139.4 12/26/2011 0905   Hepatic Function Panel     Component Value Date/Time   PROT 6.9 08/07/2018 1208   PROT 6.8 03/20/2018 1046   ALBUMIN 4.2 08/07/2018 1208   ALBUMIN 4.4 03/20/2018 1046   AST 20 08/07/2018 1208   ALT 22 08/07/2018 1208   ALKPHOS 60 08/07/2018 1208   BILITOT 0.3 08/07/2018 1208   BILITOT 0.4 03/20/2018 1046  BILIDIR 0.1 09/07/2014 1031      Component Value Date/Time   TSH 0.87 10/07/2018 0953   TSH 0.44 08/07/2018 1208   TSH 0.67 06/05/2018 1638   Results for JASELYN, NAHM "[name]" (MRN [id number]) as of 11/17/2018 09:20  Ref. Range 09/07/2014 10:31  Vitamin D 1, 25 (OH) Total Latest Ref Range: 18 - 72 pg/mL 51    I, Marcille Blanco, CMA, am acting as transcriptionist for Starlyn Skeans, MD I have reviewed the above documentation for accuracy and completeness, and I agree with the above. -Dennard Nip, MD

## 2018-11-20 ENCOUNTER — Other Ambulatory Visit: Payer: 59 | Admitting: Orthotics

## 2018-11-20 ENCOUNTER — Ambulatory Visit: Payer: 59 | Admitting: Podiatry

## 2018-11-27 ENCOUNTER — Ambulatory Visit: Payer: Self-pay

## 2018-11-27 ENCOUNTER — Emergency Department (HOSPITAL_BASED_OUTPATIENT_CLINIC_OR_DEPARTMENT_OTHER): Payer: 59

## 2018-11-27 ENCOUNTER — Other Ambulatory Visit: Payer: Self-pay

## 2018-11-27 ENCOUNTER — Encounter (HOSPITAL_BASED_OUTPATIENT_CLINIC_OR_DEPARTMENT_OTHER): Payer: Self-pay | Admitting: Emergency Medicine

## 2018-11-27 ENCOUNTER — Emergency Department (HOSPITAL_BASED_OUTPATIENT_CLINIC_OR_DEPARTMENT_OTHER)
Admission: EM | Admit: 2018-11-27 | Discharge: 2018-11-27 | Disposition: A | Discharge status: Home / Self Care | Payer: 59 | Attending: Emergency Medicine | Admitting: Emergency Medicine

## 2018-11-27 DIAGNOSIS — Z79899 Other long term (current) drug therapy: Secondary | ICD-10-CM | POA: Diagnosis not present

## 2018-11-27 DIAGNOSIS — E119 Type 2 diabetes mellitus without complications: Secondary | ICD-10-CM | POA: Insufficient documentation

## 2018-11-27 DIAGNOSIS — R0600 Dyspnea, unspecified: Secondary | ICD-10-CM | POA: Diagnosis not present

## 2018-11-27 DIAGNOSIS — I10 Essential (primary) hypertension: Secondary | ICD-10-CM | POA: Diagnosis not present

## 2018-11-27 DIAGNOSIS — Z7984 Long term (current) use of oral hypoglycemic drugs: Secondary | ICD-10-CM | POA: Diagnosis not present

## 2018-11-27 DIAGNOSIS — J45909 Unspecified asthma, uncomplicated: Secondary | ICD-10-CM | POA: Insufficient documentation

## 2018-11-27 DIAGNOSIS — R0789 Other chest pain: Secondary | ICD-10-CM | POA: Diagnosis not present

## 2018-11-27 DIAGNOSIS — R079 Chest pain, unspecified: Secondary | ICD-10-CM | POA: Diagnosis not present

## 2018-11-27 DIAGNOSIS — R0602 Shortness of breath: Secondary | ICD-10-CM | POA: Diagnosis not present

## 2018-11-27 LAB — COMPREHENSIVE METABOLIC PANEL
ALT: 47 U/L — ABNORMAL HIGH (ref 0–44)
AST: 36 U/L (ref 15–41)
Albumin: 4 g/dL (ref 3.5–5.0)
Alkaline Phosphatase: 64 U/L (ref 38–126)
Anion gap: 7 (ref 5–15)
BUN: 13 mg/dL (ref 6–20)
CO2: 26 mmol/L (ref 22–32)
Calcium: 9.3 mg/dL (ref 8.9–10.3)
Chloride: 105 mmol/L (ref 98–111)
Creatinine, Ser: 0.81 mg/dL (ref 0.44–1.00)
GFR calc Af Amer: >60 mL/min (ref >60)
GFR calc non Af Amer: >60 mL/min (ref >60)
Glucose, Bld: 113 mg/dL — ABNORMAL HIGH (ref 70–99)
Potassium: 3.9 mmol/L (ref 3.5–5.1)
Sodium: 138 mmol/L (ref 135–145)
Total Bilirubin: 0.6 mg/dL (ref 0.3–1.2)
Total Protein: 7.1 g/dL (ref 6.5–8.1)

## 2018-11-27 LAB — CBC
HCT: 41.6 % (ref 36.0–46.0)
Hemoglobin: 13.5 g/dL (ref 12.0–15.0)
MCH: 28.7 pg (ref 26.0–34.0)
MCHC: 32.5 g/dL (ref 30.0–36.0)
MCV: 88.5 fL (ref 80.0–100.0)
Platelets: 253 10*3/uL (ref 150–400)
RBC: 4.7 MIL/uL (ref 3.87–5.11)
RDW: 14.9 % (ref 11.5–15.5)
WBC: 5.2 10*3/uL (ref 4.0–10.5)
nRBC: 0 % (ref 0.0–0.2)

## 2018-11-27 LAB — D-DIMER, QUANTITATIVE: D-Dimer, Quant: 0.53 ug/mL-FEU — ABNORMAL HIGH (ref 0.00–0.50)

## 2018-11-27 LAB — TROPONIN I
Troponin I: <0.03 ng/mL (ref <0.03)
Troponin I: <0.03 ng/mL (ref <0.03)

## 2018-11-27 MED ORDER — IOHEXOL 350 MG/ML SOLN
100.0000 mL | Freq: Once | INTRAVENOUS | Status: AC | PRN
  Administered 2018-11-27: 100 mL via INTRAVENOUS

## 2018-11-27 NOTE — Discharge Instructions (Addendum)
You were seen in the emergency department today for chest pain. Your work-up in the emergency department has been overall reassuring. Your labs have been fairly normal and or similar to previous blood work you have had done- your one liver function test was minimally elevated- can be rechecked by your primary care provider. Your EKG and the enzyme we use to check your heart did not show an acute heart attack at this time. Your chest x-ray was normal. Your CT did not show a blood clot.   We would like you to follow up closely with your primary care provider within 1-3 days. Return to the ER immediately should you experience any new or worsening symptoms including but not limited to return of pain, worsened pain, vomiting, shortness of breath, dizziness, lightheadedness, passing out, or any other concerns that you may have.

## 2018-11-27 NOTE — Telephone Encounter (Signed)
Incoming call from  Patient reporting that she has been having tightness.   Location is  In the middle of chest and toward the left.  Denies radiation .  Onset was about a week and half ago.  Pattern is off and on.  Rates it  Moderate.  Denies cardiac Hx.  Reports history of asthma. Patient states that it hurts with inhaling, patient  States she feel more fatigue.    Reviewed protocol with Patient.   Recommended  Patient go to ER for further evaluation.  Patient voiced understanding.  Will be going to [facility].   Reason for Disposition . Taking a deep breath makes pain worse  Answer Assessment - Initial Assessment Questions 1. LOCATION: "Where does it hurt?"       Middle and left  With inhaleing.   2. RADIATION: "Does the pain go anywhere else?" (e.g., into neck, jaw, arms, back)     denies 3. ONSET: "When did the chest pain begin?" (Minutes, hours or days)      Week and half, of and on 4. PATTERN "Does the pain come and go, or has it been constant since it started?"  "Does it get worse with exertion?"      Off and on more fatigue 5. DURATION: "How long does it last" (e.g., seconds, minutes, hours)      6. SEVERITY: "How bad is the pain?"  (e.g., Scale 1-10; mild, moderate, or severe)    - MILD (1-3): doesn't interfere with normal activities     - MODERATE (4-7): interferes with normal activities or awakens from sleep    - SEVERE (8-10): excruciating pain, unable to do any normal activities       Moderate  7. CARDIAC RISK FACTORS: "Do you have any history of heart problems or risk factors for heart disease?" (e.g., prior heart attack, angina; high blood pressure, diabetes, being overweight, high cholesterol, smoking, or strong family history of heart disease)     denies 8. PULMONARY RISK FACTORS: "Do you have any history of lung disease?"  (e.g., blood clots in lung, asthma, emphysema, birth control pills)     asthama 9. CAUSE: "What do you think is causing the chest pain?"  *No Answer* 10. OTHER SYMPTOMS: "Do you have any other symptoms?" (e.g., dizziness, nausea, vomiting, sweating, fever, difficulty breathing, cough)       nausea 11. PREGNANCY: "Is there any chance you are pregnant?" "When was your last menstrual period?"       hysterectomy  Protocols used: CHEST PAIN-A-AH

## 2018-11-27 NOTE — ED Provider Notes (Signed)
[place] EMERGENCY DEPARTMENT Provider Note   CSN: [id number] Arrival date & time: 11/27/18  1128    History   Chief Complaint Chief Complaint  Patient presents with  . Chest Pain    HPI  Emily Osborne is a 58 y.o. female with a hx of T2DM, asthma, chornic fatigue syndrome, fibromyalgia, HTN, hyperlipidemia, IBS, anxiety, and Graves disease who presents to the ED with complaints of chest pain x 1.5 weeks. Pain is located to the L chest and is sharp in nature. Initially pain was intermittent, brief episodes w/ deep breathing, has become constant over past 24 hours. Currently 5/10 in severity, worse with deep breath and if she is exerting herself leading to increased RR, associated with mild dyspnea. Tried tylenol without much change. Also tried atenolol without much change- hx of issues with chest pain/palpitations w/ thyroid issues- given atenolol which usually helps, this feels different. Has had cardiac cath in 2018 without evidence of CAD. Notes mild intermittent cough that is dry. Has felt generally weak. Denies fever, chills, congestion, sore throat, nausea, vomiting, diaphoresis, syncope, leg pain/swelling, hemoptysis, recent surgery/trauma, recent long travel, hormone use, personal hx of cancer, or hx of DVT/PE.   Patient works as a Veterinary surgeon, has not had direct patient exposure or other exposure to confirmed covid 19 case. Currently home from work for past 2 weeks due to her children having flu like illness which has resolved. She has not had fevers. She has not traveled.         HPI  Past Medical History:  Diagnosis Date  . Allergy   . Anxiety   . Asthma    pt has inhaler  . Back pain   . C. difficile colitis   . Cataract    bil eyes  . Chest pain   . Chronic fatigue syndrome   . Clostridium difficile colitis 12/13/2016  . Diverticulitis   . Dyspnea   . Dysrhythmia    palpitations  . Edema   . Elevated blood pressure reading without  diagnosis of hypertension    "just elevated when I'm in pain" (12/07/2015)  . Fatty liver   . Fibromyalgia   . Ganglion of joint    right wrist  . GERD (gastroesophageal reflux disease)   . H. pylori infection   . Headache    hx of  . Helicobacter pylori (H. pylori) infection 12/13/2016  . Herpes zoster without mention of complication   . HTN (hypertension)   . Hyperlipidemia   . Hyperthyroidism   . IBS (irritable bowel syndrome)   . Leg edema   . Multiple drug allergies 06/18/2017  . Myalgia   . Nontraumatic rupture of Achilles tendon   . Other acute reactions to stress   . Pain in joint, shoulder region   . Pain in joint, upper arm   . Pain in limb   . Palpitations   . Personal history of other diseases of digestive system   . Pneumonia    once  . PONV (postoperative nausea and vomiting)   . Routine screening for STI (sexually transmitted infection) 12/13/2016  . S/P partial colectomy 06/18/2017  . Thyroid disease    hyperthyroidism  . Type 2 diabetes mellitus with hyperglycemia, without long-term current use of insulin ([facility]) 08/07/2018  . Vitamin B 12 deficiency     Patient Active Problem List   Diagnosis Date Noted  . Prediabetes 10/07/2018  . Preventative health care 08/07/2018  . Anxiety 10/11/2017  .  S/P partial colectomy 06/18/2017  . Multiple drug allergies 06/18/2017  . Colitis 02/05/2017  . Diarrhea 02/05/2017  . Diverticular disease 01/23/2017  . Helicobacter pylori (H. pylori) infection 12/13/2016  . Routine screening for STI (sexually transmitted infection) 12/13/2016  . Clostridium difficile colitis 12/13/2016  . Diverticulitis 10/10/2016  . Lower abdominal pain 09/29/2016  . Palpitations 02/06/2016  . Mild diastolic dysfunction 24/23/5361  . Graves disease 01/02/2016  . Acute diverticulitis 12/07/2015  . Acute bacterial sinusitis 10/04/2015  . History of colonic polyps 11/15/2014  . Left shoulder pain 09/28/2013  . Left-sided weakness 09/16/2013   . Obesity (BMI 30-39.9) 09/02/2013  . Myalgia 06/21/2011  . POSTMENOPAUSAL STATUS 09/07/2010  . PAIN IN JOINT, MULTIPLE SITES 12/27/2009  . LOW BACK PAIN, CHRONIC 12/27/2009  . FATIGUE 12/27/2009  . VAGINITIS 11/22/2009  . DYSPEPSIA&OTHER [facility] DISORDERS FUNCTION STOMACH 09/01/2009  . NAUSEA 08/03/2009  . FLATULENCE-GAS-BLOATING 08/03/2009  . ABDOMINAL PAIN RIGHT UPPER QUADRANT 07/06/2009  . SUPRAPUBIC PAIN 07/06/2009  . GANGLION CYST, HX OF 07/06/2009  . BACK PAIN 05/04/2009  . Hyperlipidemia 09/16/2008  . UNSPECIFIED MYALGIA AND MYOSITIS 09/16/2008  . Precordial pain 07/27/2008  . GERD 06/08/2008  . HELICOBACTER PYLORI GASTRITIS, HX OF 04/05/2008  . OTHER ACUTE REACTIONS TO STRESS 03/16/2008  . ABDOMINAL PAIN, EPIGASTRIC 03/16/2008  . SHOULDER PAIN, RIGHT 10/01/2007  . ELBOW PAIN, RIGHT 10/01/2007  . Pain in limb 04/14/2007  . DEPENDENT EDEMA, RIGHT LEG 04/14/2007  . SHINGLES 04/08/2007  . Asthma 03/18/2007  . GANGLION CYST, WRIST, RIGHT 03/18/2007  . BUNIONECTOMY, HX OF 03/18/2007    Past Surgical History:  Procedure Laterality Date  . ABDOMINAL HYSTERECTOMY    . ACHILLES TENDON REPAIR Right 2005  . BUNIONECTOMY Bilateral    Bunionectomy 1983  . COLON SURGERY  01/23/2017   6 to 8 inches sigmoid colon removed  . COLONOSCOPY    . GANGLION CYST EXCISION Right    wrist  . LEFT HEART CATH AND CORONARY ANGIOGRAPHY N/A 10/03/2016   Procedure: Left Heart Cath and Coronary Angiography;  Surgeon: Burnell Blanks, MD;  Location: [facility];  Service: Cardiovascular;  Laterality: N/A;  . MENISCUS REPAIR Right 04/2014  . POLYPECTOMY    . TUBAL LIGATION    . wisdomteeth extraction       OB History    Gravida  2   Para      Term      Preterm      AB      Living  2     SAB      TAB      Ectopic      Multiple      Live Births               Home Medications    Prior to Admission medications   Medication Sig Start Date End Date Taking?  Authorizing Provider  acetaminophen (TYLENOL) 500 MG tablet Take 1,000 mg by mouth every 6 (six) hours as needed for moderate pain.     [provider]  albuterol (PROAIR HFA) 108 (90 Base) MCG/ACT inhaler USE 2 PUFFS EVERY 4 HOURS AS NEEDED FOR COUGH, WHEEZE OR SHORTNESS OF BREATH. 07/29/18   Carollee Herter, Alferd Apa, DO  ALPRAZolam (XANAX) 0.25 MG tablet Take 1 tablet (0.25 mg total) by mouth 3 (three) times daily as needed. 07/29/18   [name] R, DO  atenolol (TENORMIN) 25 MG tablet TAKE 1/2 TABLETS (12.5 MG TOTAL) BY MOUTH DAILY 07/29/18  Carollee Herter, Yvonne R, DO  azelastine (OPTIVAR) 0.05 % ophthalmic solution Place 1 drop into both eyes 2 (two) times daily as needed.    [provider]  Blood Glucose Monitoring Suppl (FREESTYLE FREEDOM) KIT 1 Device by Does not apply route daily. 06/08/14   [name], DO  dexlansoprazole (DEXILANT) 60 MG capsule Take 1 capsule (60 mg total) by mouth daily. 07/29/18   [name] R, DO  diclofenac sodium (VOLTAREN) 1 % GEL Apply 2 g topically 4 (four) times daily. Rub into affected area of foot 2 to 4 times daily 10/02/18   Trula Slade, DPM  fexofenadine (ALLEGRA) 180 MG tablet Take 1 tablet (180 mg total) by mouth daily. 07/29/18   [name] R, DO  fluticasone (FLONASE) 50 MCG/ACT nasal spray PLACE 2 SPRAYS INTO BOTH NOSTRILS DAILY. 07/29/18   [name], DO  Fluticasone-Salmeterol (ADVAIR DISKUS) 250-50 MCG/DOSE AEPB Inhale 1 puff into the lungs 2 (two) times daily. 11/12/18   [name], DO  FREESTYLE LITE test strip USE TO CHECK BLOOD SUGAR ONCE A DAY 03/04/17   [name], DO  Iron Combinations (I.L.X. B-12) ELIX Take 1 each by mouth daily as needed (energy). 1 droplet full    [provider]  lidocaine (LIDODERM) 5 % Apply to affected area QD PRN for a Week 08/27/18   Dean, Tonna Corner, MD  metFORMIN (GLUCOPHAGE) 500 MG tablet Take 1 tablet (500 mg total)  by mouth daily with breakfast. 11/13/18   Dennard Nip D, MD  valACYclovir (VALTREX) 1000 MG tablet Take 1,000 mg by mouth 3 (three) times daily as needed.    [provider]  Vitamin D, Ergocalciferol, (DRISDOL) 1.25 MG (50000 UT) CAPS capsule Take 1 capsule (50,000 Units total) by mouth every 7 (seven) days. 11/13/18   Starlyn Skeans, MD    Family History Family History  Problem Relation Age of Onset  . Hypertension Father   . Diabetes Father   . Heart disease Father   . Kidney disease Father        Died, 36  . Hyperlipidemia Father   . Leukemia Brother   . Cancer Brother        myleoblastic anemia  . Hyperlipidemia Mother   . Hypertension Mother        Died, 86  . Thyroid disease Mother        Thyroid surgery  . Obesity Mother   . Heart disease Mother   . Pernicious anemia Sister   . Thyroid disease Sister        On thyroid Rx  . Lupus Daughter   . Coronary artery disease Brother   . Hypertension Brother   . Colon cancer Neg Hx   . Esophageal cancer Neg Hx   . Rectal cancer Neg Hx   . Stomach cancer Neg Hx   . Pancreatic cancer Neg Hx   . Prostate cancer Neg Hx   . Colon polyps Neg Hx     Social History Social History   Tobacco Use  . Smoking status: Never Smoker  . Smokeless tobacco: Never Used  Substance Use Topics  . Alcohol use: No    Alcohol/week: 0.0 standard drinks  . Drug use: No     Allergies   Aspirin; Gentamicin; Ibuprofen; Metronidazole; Nsaids; Doxycycline; Influenza a (h1n1) monoval vac; Loratadine; Periactin [cyproheptadine]; Ranitidine hcl; Sulfamethoxazole-trimethoprim; and Tramadol hcl   Review of Systems Review of Systems  Constitutional: Negative for chills and fever.  HENT: Negative for congestion, ear pain and sore throat.   Respiratory: Positive for cough and shortness of breath. Negative for wheezing and stridor.   Cardiovascular: Positive for chest pain. Negative for palpitations and leg swelling.  Gastrointestinal:  Negative for abdominal pain, nausea and vomiting.  Neurological: Positive for weakness (generalized, non focal). Negative for syncope.  All other systems reviewed and are negative.    Physical Exam Updated Vital Signs Pulse 70   Temp 98 F (36.7 C) (Oral)   Resp 16   Ht _0  (1.651 m)   Wt 104.3 kg   SpO2 100%   BMI 38.27 kg/m   Physical Exam Vitals signs and nursing note reviewed.  Constitutional:      General: She is not in acute distress.    Appearance: She is well-developed. She is not toxic-appearing.  HENT:     Head: Normocephalic and atraumatic.  Eyes:     General:        Right eye: No discharge.        Left eye: No discharge.     Conjunctiva/sclera: Conjunctivae normal.  Neck:     Musculoskeletal: Neck supple.  Cardiovascular:     Rate and Rhythm: Normal rate and regular rhythm.     Pulses:          Radial pulses are 2+ on the right side and 2+ on the left side.  Pulmonary:     Effort: Pulmonary effort is normal. No respiratory distress.     Breath sounds: Normal breath sounds. No wheezing, rhonchi or rales.  Chest:     Chest wall: No tenderness.  Abdominal:     General: There is no distension.     Palpations: Abdomen is soft.     Tenderness: There is no abdominal tenderness.  Musculoskeletal:     Right lower leg: She exhibits no tenderness. No edema.     Left lower leg: She exhibits no tenderness. No edema.  Skin:    General: Skin is warm and dry.     Findings: No rash.  Neurological:     Mental Status: She is alert.     Comments: Clear speech.   Psychiatric:        Behavior: Behavior normal.    ED Treatments / Results  Labs (all labs ordered are listed, but only abnormal results are displayed) Labs Reviewed  COMPREHENSIVE METABOLIC PANEL - Abnormal; Notable for the following components:      Result Value   Glucose, Bld 113 (*)    ALT 47 (*)    All other components within normal limits  D-DIMER, QUANTITATIVE (NOT AT [facility]) - Abnormal;  Notable for the following components:   D-Dimer, Quant 0.53 (*)    All other components within normal limits  CBC  TROPONIN I  TROPONIN I    EKG EKG Interpretation  Date: 11/27/18 @ 12:35 Vent. rate 63 BPM PR interval * ms QRS duration 93 ms QT/QTc 411/421 ms P-R-T axes 48 14 27 Sinus rhythm.   Radiology Ct Angio Chest Pe W/cm &/or Wo Cm  Result Date: 11/27/2018 CLINICAL DATA:  Chest pain, shortness of breath EXAM: CT ANGIOGRAPHY CHEST WITH CONTRAST TECHNIQUE: Multidetector CT imaging of the chest was performed using the standard protocol during bolus administration of intravenous contrast. Multiplanar CT image reconstructions and MIPs were obtained to evaluate the vascular anatomy. CONTRAST:  129m OMNIPAQUE IOHEXOL 350 MG/ML SOLN COMPARISON:  06/06/2018 FINDINGS: Cardiovascular: Satisfactory opacification of  the pulmonary arteries to the segmental level. No evidence of pulmonary embolism. Normal heart size. No pericardial effusion. Mediastinum/Nodes: No enlarged mediastinal, hilar, or axillary lymph nodes. Thyroid gland, trachea, and esophagus demonstrate no significant findings. Lungs/Pleura: Lungs are clear. No pleural effusion or pneumothorax. Upper Abdomen: No acute abnormality. Musculoskeletal: No chest wall abnormality. No acute or significant osseous findings. Review of the MIP images confirms the above findings. IMPRESSION: Negative examination for pulmonary embolism. Electronically Signed   By: Eddie Candle M.D.   On: 11/27/2018 14:53   Dg Chest Port 1 View  Result Date: 11/27/2018 CLINICAL DATA:  Chest pain, shortness of breath EXAM: PORTABLE CHEST 1 VIEW COMPARISON:  08/22/2017 FINDINGS: Heart and mediastinal contours are within normal limits. No focal opacities or effusions. No acute bony abnormality. IMPRESSION: No active disease. Electronically Signed   By: Rolm Baptise M.D.   On: 11/27/2018 13:03    Procedures Procedures (including critical care time)  Medications  Ordered in ED Medications  iohexol (OMNIPAQUE) 350 MG/ML injection 100 mL (100 mLs Intravenous Contrast Given 11/27/18 1419)     Initial Impression / Assessment and Plan / ED Course  I have reviewed the triage vital signs and the nursing notes.  Pertinent labs & imaging results that were available during my care of the patient were reviewed by me and considered in my medical decision making (see chart for details).    Patient presents to the emergency department with chest pain w/ mild dyspnea, has had cough recently. Patient nontoxic appearing, in no apparent distress, vitals without significant abnormality. Fairly benign physical exam. Heart RRR. Lungs CTA.  DDX: ACS, pulmonary embolism, dissection, pneumothorax, effusion, pneumonia, viral respiratory illness, arrhythmia, anemia, electrolyte derangement, MSK, GERD, anxiety. Evaluation initiated with labs, EKG, and CXR. Patient on cardiac monitor.   Work-up in the ER reviewed:  CBC: No anemia/leukocytosis CMP: Fairly unremarkable, ALT minimal elevation @ 47, mild hyperglycemia @ 113. Electrolytes WNL. Renal function preserved. Delta trop: Negative.  EKG: No STEMI.  CXR:  Negative, without infiltrate, effusion, pneumothorax, or fracture/dislocation.   Low risk wells--> D dimer + --> CTA negative for PE or other acute abnormality. Low risk heart score, has had normal cath within past 5 years on chart review, EKG without obvious ischemia, delta troponin negative, doubt ACS. Pain is not a tearing sensation, symmetric pulses, no widening of mediastinum on CXR, doubt dissection. Cardiac monitor reviewed, no notable arrhythmias or significant tachycardia. Unclear definitive etiology, in setting of cough suspect MSK vs. Pleurisy, low suspicion for covid 19 without fever and minimal cough- patient already is quarantining. Patient has appeared hemodynamically stable throughout ER visit and appears safe for discharge with close PCP follow up, she states she  would prefer to take tylenol at home for pain. I discussed results, treatment plan, need for PCP follow-up, and return precautions with the patient. Provided opportunity for questions, patient confirmed understanding and is in agreement with plan.    Emily Osborne was evaluated in Emergency Department on 11/27/2018 for the symptoms described in the history of present illness. He/she was evaluated in the context of the global COVID-19 pandemic, which necessitated consideration that the patient might be at risk for infection with the SARS-CoV-2 virus that causes COVID-19. Institutional protocols and algorithms that pertain to the evaluation of patients at risk for COVID-19 are in a state of rapid change based on information released by regulatory bodies including the CDC and federal and state organizations. These policies and algorithms were followed during the  patient's care in the ED.  Final Clinical Impressions(s) / ED Diagnoses   Final diagnoses:  Chest pain, unspecified type    ED Discharge Orders    None       Amaryllis Dyke, PA-C 11/27/18 1602    Maudie Flakes, MD 11/30/18 [phone]

## 2018-11-27 NOTE — ED Notes (Signed)
Patient transported to CT 

## 2018-11-27 NOTE — ED Triage Notes (Addendum)
CP and SOB for a week and a half.  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

## 2018-11-28 ENCOUNTER — Encounter: Payer: Self-pay | Admitting: Family Medicine

## 2018-12-02 ENCOUNTER — Other Ambulatory Visit: Payer: Self-pay

## 2018-12-02 ENCOUNTER — Ambulatory Visit (INDEPENDENT_AMBULATORY_CARE_PROVIDER_SITE_OTHER): Payer: 59 | Admitting: Orthotics

## 2018-12-02 ENCOUNTER — Encounter (INDEPENDENT_AMBULATORY_CARE_PROVIDER_SITE_OTHER): Payer: Self-pay | Admitting: Family Medicine

## 2018-12-02 ENCOUNTER — Ambulatory Visit: Payer: 59 | Admitting: Podiatry

## 2018-12-02 ENCOUNTER — Ambulatory Visit (INDEPENDENT_AMBULATORY_CARE_PROVIDER_SITE_OTHER): Payer: 59 | Admitting: Family Medicine

## 2018-12-02 ENCOUNTER — Encounter: Payer: Self-pay | Admitting: Podiatry

## 2018-12-02 VITALS — Temp 97.3°F

## 2018-12-02 DIAGNOSIS — E559 Vitamin D deficiency, unspecified: Secondary | ICD-10-CM | POA: Diagnosis not present

## 2018-12-02 DIAGNOSIS — M779 Enthesopathy, unspecified: Secondary | ICD-10-CM

## 2018-12-02 DIAGNOSIS — Z6837 Body mass index (BMI) 37.0-37.9, adult: Secondary | ICD-10-CM | POA: Diagnosis not present

## 2018-12-02 DIAGNOSIS — M7672 Peroneal tendinitis, left leg: Secondary | ICD-10-CM

## 2018-12-02 DIAGNOSIS — M7752 Other enthesopathy of left foot: Secondary | ICD-10-CM

## 2018-12-02 DIAGNOSIS — M7751 Other enthesopathy of right foot: Secondary | ICD-10-CM | POA: Diagnosis not present

## 2018-12-02 DIAGNOSIS — R7303 Prediabetes: Secondary | ICD-10-CM | POA: Diagnosis not present

## 2018-12-02 MED ORDER — VITAMIN D (ERGOCALCIFEROL) 1.25 MG (50000 UNIT) PO CAPS: 50000.0000 [IU] | ORAL_CAPSULE | ORAL | 0 refills | Status: DC

## 2018-12-02 MED FILL — metFORMIN HCL 500 MG TABS: 500 | 30 days supply | Qty: 30 | Fill #0

## 2018-12-02 MED FILL — VIT D2 1.25 MG (50,000 UNIT: 1.25 MG | 28 days supply | Qty: 4 | Fill #0

## 2018-12-02 NOTE — Progress Notes (Signed)
Patient came into today to be cast for Custom Foot Orthotics. Upon recommendation of Dr. Jacqualyn Posey Patient presents with pes cavus foot type, lateral foot pain Goals are support arch, added rf wedge (valgus) Plan vendor [employer]

## 2018-12-02 NOTE — Progress Notes (Signed)
Subjective: 58 year old female presents the office today for follow-up evaluation of left foot pain.  She said the injection was very helpful.  She points to the fifth metatarsal base where she still gets some discomfort however.  She also presents today to see Liliane Channel to get measured for orthotics. Denies any systemic complaints such as fevers, chills, nausea, vomiting. No acute changes since last appointment, and no other complaints at this time.   Objective: AAO x3, NAD DP/PT pulses palpable bilaterally, CRT less than 3 seconds Today the majority tenderness is present the fifth metatarsal base in the left foot.  The area of discomfort she was having on the dorsal lateral aspect of the foot has resolved.  There is no pain on the course of the peroneal tendon over the peroneal tendon appears to be intact.  There is no significant edema, erythema.  No open lesions or pre-ulcerative lesions.  No pain with calf compression, swelling, warmth, erythema  Assessment: 58 year old female with insertional peroneal tendinitis, foot metatarsal base pain  Plan: -All treatment options discussed with the patient including all alternatives, risks, complications.  -Today steroid injections performed the metatarsal base.  It was prepped with alcohol and mixture of 0.5 cc of Marcaine plain, 0.5 cc of dexamethasone phosphate was infiltrated with metatarsal base without complications.  Care was taken not to directly infiltrated into the peroneal tendon.  She has a Tri-Lock brace although I want her to wear it for protection.  Ice and elevation.  Discussed shoe modifications as well.  Liliane Channel saw her today and was measured for orthotics. -Patient encouraged to call the office with any questions, concerns, change in symptoms.   Trula Slade DPM

## 2018-12-08 ENCOUNTER — Encounter: Payer: Self-pay | Admitting: *Deleted

## 2018-12-08 MED ORDER — METFORMIN HCL 500 MG PO TABS: 500.0000 mg | ORAL_TABLET | Freq: Every day | ORAL | 0 refills | Status: DC

## 2018-12-08 NOTE — Progress Notes (Signed)
Office: [phone]  /  Fax: [phone] TeleHealth Visit:  Emily Osborne has verbally consented to this TeleHealth visit today. The patient is located at home, the provider is located at the [facility] office. The participants in this visit include the listed provider and patient. The visit was conducted today via face time.  HPI:   Chief Complaint: OBESITY Emily Osborne is here to discuss her progress with her obesity treatment plan. She is on the Category 2 plan and is following her eating plan approximately 70 % of the time. She states she is exercising 0 minutes 0 times per week. Emily Osborne is doing a good amount of boredom snacking. She voices she has been following breakfast, but not following lunch or dinner on the meal plan a good portion of the time. She is looking for an alternative for dinner that is less meat protein.  We were unable to weigh the patient today for this TeleHealth visit. She feels as if she has gained 3 pounds since her last visit. She has lost 0 lbs since starting treatment with [place].  Pre-Diabetes Emily Osborne has a diagnosis of pre-diabetes based on her elevated Hgb A1c and was informed this puts her at greater risk of developing diabetes. She denies nausea, vomiting, or hypoglycemia on metformin. She is noticing improvement in hunger with metformin. She continues to work on diet and exercise to decrease risk of diabetes.   Vitamin D Deficiency Emily Osborne has a diagnosis of vitamin D deficiency. She is currently taking prescription Vit D. She notes fatigue and denies nausea, vomiting or muscle weakness.  ASSESSMENT AND PLAN:  Prediabetes - Plan: metFORMIN (GLUCOPHAGE) 500 MG tablet  Vitamin D deficiency - Plan: Vitamin D, Ergocalciferol, (DRISDOL) 1.25 MG (50000 UT) CAPS capsule  Class 2 severe obesity with serious comorbidity and body mass index (BMI) of 37.0 to 37.9 in adult, unspecified obesity type Emily Osborne)  PLAN:  Pre-Diabetes Emily Osborne will continue to  work on weight loss, exercise, and decreasing simple carbohydrates in her diet to help decrease the risk of diabetes. We dicussed metformin including benefits and risks. She was informed that eating too many simple carbohydrates or too many calories at one sitting increases the likelihood of GI side effects. Emily Osborne agrees to continue taking metformin 500 mg PO daily #30 and we will refill for 1 month. Emily Osborne agrees to follow up with our clinic in 2 weeks as directed to monitor her progress.  Vitamin D Deficiency Emily Osborne was informed that low vitamin D levels contributes to fatigue and are associated with obesity, breast, and colon cancer. Emily Osborne agrees to continue taking prescription Vit D _0 ,000 IU every week #4 and we will refill for 1 month. She will follow up for routine testing of vitamin D, at least 2-3 times per year. She was informed of the risk of over-replacement of vitamin D and agrees to not increase her dose unless she discusses this with [place] first. Emily Osborne agrees to follow up with our clinic in 2 weeks.  Obesity Emily Osborne is currently in the action stage of change. As such, her goal is to continue with weight loss efforts She has agreed to follow our protein rich vegetarian plan for dinner Emily Osborne has been instructed to work up to a goal of 150 minutes of combined cardio and strengthening exercise per week for weight loss and overall health benefits. We discussed the following Behavioral Modification Strategies today: increasing lean protein intake, increasing vegetables, work on meal planning and easy cooking plans, emotional eating  strategies, ways to avoid boredom eating, and planning for success   Emily Osborne has agreed to follow up with our clinic in 2 weeks. She was informed of the importance of frequent follow up visits to maximize her success with intensive lifestyle modifications for her multiple health conditions.  ALLERGIES: Allergies  Allergen Reactions  . Aspirin Other (See  Comments)    REACTION: anaphylaxis  . Gentamicin Other (See Comments)    Eye drops turned the sclera bright red  . Ibuprofen Other (See Comments)    REACTION: anaphylaxsis  . Metronidazole Swelling    REACTION: red face/swelling  . Nsaids Other (See Comments)    REACTION: anaphylaxis  . Doxycycline Other (See Comments)    REACTION: severe nausea/vomiting  . Influenza A (H1n1) [place] Other (See Comments)    REACTION: sick for 3 weeks  . Loratadine Other (See Comments)    Fatigue/weakness  . Periactin [Cyproheptadine] Other (See Comments)    paranoid   . Ranitidine Hcl Other (See Comments)    REACTION: Lips turned red /peel  . Sulfamethoxazole-Trimethoprim Other (See Comments)    REACTION: face red/peel  . Tramadol Hcl Other (See Comments)    REACTION: paranoid    MEDICATIONS: Current Outpatient Medications on File Prior to Visit  Medication Sig Dispense Refill  . acetaminophen (TYLENOL) 500 MG tablet Take 1,000 mg by mouth every 6 (six) hours as needed for moderate pain.     Marland Kitchen albuterol (PROAIR HFA) 108 (90 Base) MCG/ACT inhaler USE 2 PUFFS EVERY 4 HOURS AS NEEDED FOR COUGH, WHEEZE OR SHORTNESS OF BREATH. 8.5 each 2  . ALPRAZolam (XANAX) 0.25 MG tablet Take 1 tablet (0.25 mg total) by mouth 3 (three) times daily as needed. 30 tablet 2  . atenolol (TENORMIN) 25 MG tablet TAKE 1/2 TABLETS (12.5 MG TOTAL) BY MOUTH DAILY 45 tablet 3  . azelastine (OPTIVAR) 0.05 % ophthalmic solution Place 1 drop into both eyes 2 (two) times daily as needed.    . Blood Glucose Monitoring Suppl (FREESTYLE FREEDOM) KIT 1 Device by Does not apply route daily. 1 each 0  . dexlansoprazole (DEXILANT) 60 MG capsule Take 1 capsule (60 mg total) by mouth daily. 90 capsule 0  . diclofenac sodium (VOLTAREN) 1 % GEL Apply 2 g topically 4 (four) times daily. Rub into affected area of foot 2 to 4 times daily 100 g 2  . fexofenadine (ALLEGRA) 180 MG tablet Take 1 tablet (180 mg total) by mouth daily. 90 tablet  3  . fluticasone (FLONASE) 50 MCG/ACT nasal spray PLACE 2 SPRAYS INTO BOTH NOSTRILS DAILY. 16 g 4  . Fluticasone-Salmeterol (ADVAIR DISKUS) 250-50 MCG/DOSE AEPB Inhale 1 puff into the lungs 2 (two) times daily. 180 each 1  . FREESTYLE LITE test strip USE TO CHECK BLOOD SUGAR ONCE A DAY 100 each 0  . Iron Combinations (I.L.X. B-12) ELIX Take 1 each by mouth daily as needed (energy). 1 droplet full    . lidocaine (LIDODERM) 5 % Apply to affected area QD PRN for a Week 30 patch 0  . valACYclovir (VALTREX) 1000 MG tablet Take 1,000 mg by mouth 3 (three) times daily as needed.     No current facility-administered medications on file prior to visit.     PAST MEDICAL HISTORY: Past Medical History:  Diagnosis Date  . Allergy   . Anxiety   . Asthma    pt has inhaler  . Back pain   . C. difficile colitis   . Cataract  bil eyes  . Chest pain   . Chronic fatigue syndrome   . Clostridium difficile colitis 12/13/2016  . Diverticulitis   . Dyspnea   . Dysrhythmia    palpitations  . Edema   . Elevated blood pressure reading without diagnosis of hypertension    "just elevated when I'm in pain" (12/07/2015)  . Fatty liver   . Fibromyalgia   . Ganglion of joint    right wrist  . GERD (gastroesophageal reflux disease)   . H. pylori infection   . Headache    hx of  . Helicobacter pylori (H. pylori) infection 12/13/2016  . Herpes zoster without mention of complication   . HTN (hypertension)   . Hyperlipidemia   . Hyperthyroidism   . IBS (irritable bowel syndrome)   . Leg edema   . Multiple drug allergies 06/18/2017  . Myalgia   . Nontraumatic rupture of Achilles tendon   . Other acute reactions to stress   . Pain in joint, shoulder region   . Pain in joint, upper arm   . Pain in limb   . Palpitations   . Personal history of other diseases of digestive system   . Pneumonia    once  . PONV (postoperative nausea and vomiting)   . Routine screening for STI (sexually transmitted  infection) 12/13/2016  . S/P partial colectomy 06/18/2017  . Thyroid disease    hyperthyroidism  . Type 2 diabetes mellitus with hyperglycemia, without long-term current use of insulin ([place]) 08/07/2018  . Vitamin B 12 deficiency     PAST SURGICAL HISTORY: Past Surgical History:  Procedure Laterality Date  . ABDOMINAL HYSTERECTOMY    . ACHILLES TENDON REPAIR Right 2005  . BUNIONECTOMY Bilateral    Bunionectomy 1983  . COLON SURGERY  01/23/2017   6 to 8 inches sigmoid colon removed  . COLONOSCOPY    . GANGLION CYST EXCISION Right    wrist  . LEFT HEART CATH AND CORONARY ANGIOGRAPHY N/A 10/03/2016   Procedure: Left Heart Cath and Coronary Angiography;  Surgeon: Burnell Blanks, MD;  Location: [facility];  Service: Cardiovascular;  Laterality: N/A;  . MENISCUS REPAIR Right 04/2014  . POLYPECTOMY    . TUBAL LIGATION    . wisdomteeth extraction      SOCIAL HISTORY: Social History   Tobacco Use  . Smoking status: Never Smoker  . Smokeless tobacco: Never Used  Substance Use Topics  . Alcohol use: No    Alcohol/week: 0.0 standard drinks  . Drug use: No    FAMILY HISTORY: Family History  Problem Relation Age of Onset  . Hypertension Father   . Diabetes Father   . Heart disease Father   . Kidney disease Father        Died, 31  . Hyperlipidemia Father   . Leukemia Brother   . Cancer Brother        myleoblastic anemia  . Hyperlipidemia Mother   . Hypertension Mother        Died, 70  . Thyroid disease Mother        Thyroid surgery  . Obesity Mother   . Heart disease Mother   . Pernicious anemia Sister   . Thyroid disease Sister        On thyroid Rx  . Lupus Daughter   . Coronary artery disease Brother   . Hypertension Brother   . Colon cancer Neg Hx   . Esophageal cancer Neg Hx   . Rectal cancer  Neg Hx   . Stomach cancer Neg Hx   . Pancreatic cancer Neg Hx   . Prostate cancer Neg Hx   . Colon polyps Neg Hx     ROS: Review of Systems   Constitutional: Positive for malaise/fatigue. Negative for weight loss.  Gastrointestinal: Negative for nausea and vomiting.  Musculoskeletal:       Negative muscle weakness  Endo/Heme/Allergies:       Negative hypoglycemia    PHYSICAL EXAM: Pt in no acute distress  RECENT LABS AND TESTS: BMET    Component Value Date/Time   NA 138 11/27/2018 1144   NA 142 03/20/2018 1046   K 3.9 11/27/2018 1144   CL 105 11/27/2018 1144   CO2 26 11/27/2018 1144   GLUCOSE 113 (H) 11/27/2018 1144   BUN 13 11/27/2018 1144   BUN 17 03/20/2018 1046   CREATININE 0.81 11/27/2018 1144   CREATININE 0.97 03/23/2016 1501   CALCIUM 9.3 11/27/2018 1144   GFRNONAA >60 11/27/2018 1144   GFRAA >60 11/27/2018 1144   Lab Results  Component Value Date   HGBA1C 5.8 08/07/2018   HGBA1C 6.0 05/13/2018   HGBA1C 5.6 03/20/2018   HGBA1C 5.8 (H) 12/12/2017   HGBA1C 5.5 01/15/2017   Lab Results  Component Value Date   INSULIN 20.0 03/20/2018   INSULIN 17.6 12/12/2017   CBC    Component Value Date/Time   WBC 5.2 11/27/2018 1144   RBC 4.70 11/27/2018 1144   HGB 13.5 11/27/2018 1144   HGB 13.5 12/12/2017 1102   HCT 41.6 11/27/2018 1144   HCT 41.8 12/12/2017 1102   PLT 253 11/27/2018 1144   MCV 88.5 11/27/2018 1144   MCV 89 12/12/2017 1102   MCH 28.7 11/27/2018 1144   MCHC 32.5 11/27/2018 1144   RDW 14.9 11/27/2018 1144   RDW 15.1 12/12/2017 1102   LYMPHSABS 2.3 08/07/2018 1208   LYMPHSABS 2.3 12/12/2017 1102   MONOABS 0.5 08/07/2018 1208   EOSABS 0.1 08/07/2018 1208   EOSABS 0.1 12/12/2017 1102   BASOSABS 0.0 08/07/2018 1208   BASOSABS 0.0 12/12/2017 1102   Iron/TIBC/Ferritin/ %Sat No results found for: IRON, TIBC, FERRITIN, IRONPCTSAT Lipid Panel     Component Value Date/Time   CHOL 192 08/07/2018 1208   CHOL 197 03/20/2018 1046   TRIG 167.0 (H) 08/07/2018 1208   HDL 53.50 08/07/2018 1208   HDL 57 03/20/2018 1046   CHOLHDL 4 08/07/2018 1208   VLDL 33.4 08/07/2018 1208   LDLCALC 105  (H) 08/07/2018 1208   LDLCALC 124 (H) 03/20/2018 1046   LDLDIRECT 139.4 12/26/2011 0905   Hepatic Function Panel     Component Value Date/Time   PROT 7.1 11/27/2018 1144   PROT 6.8 03/20/2018 1046   ALBUMIN 4.0 11/27/2018 1144   ALBUMIN 4.4 03/20/2018 1046   AST 36 11/27/2018 1144   ALT 47 (H) 11/27/2018 1144   ALKPHOS 64 11/27/2018 1144   BILITOT 0.6 11/27/2018 1144   BILITOT 0.4 03/20/2018 1046   BILIDIR 0.1 09/07/2014 1031      Component Value Date/Time   TSH 0.87 10/07/2018 0953   TSH 0.44 08/07/2018 1208   TSH 0.67 06/05/2018 1638      I, Trixie Dredge, am acting as transcriptionist for Ilene Qua, MD  I have reviewed the above documentation for accuracy and completeness, and I agree with the above. - Ilene Qua, MD

## 2018-12-09 ENCOUNTER — Other Ambulatory Visit: Payer: Self-pay

## 2018-12-09 ENCOUNTER — Encounter: Payer: Self-pay | Admitting: Family Medicine

## 2018-12-09 ENCOUNTER — Ambulatory Visit (INDEPENDENT_AMBULATORY_CARE_PROVIDER_SITE_OTHER): Payer: 59 | Admitting: Family Medicine

## 2018-12-09 DIAGNOSIS — R079 Chest pain, unspecified: Secondary | ICD-10-CM | POA: Diagnosis not present

## 2018-12-09 DIAGNOSIS — F419 Anxiety disorder, unspecified: Secondary | ICD-10-CM | POA: Diagnosis not present

## 2018-12-09 MED FILL — DEXILANT DR 60 MG CAPSULE: 60 | 90 days supply | Qty: 90 | Fill #0

## 2018-12-09 NOTE — Assessment & Plan Note (Signed)
May be cause of cp although pt denies Pt has xanax she takes very rarely

## 2018-12-09 NOTE — Assessment & Plan Note (Signed)
Pt advised to f/u with cardiology If cp returns go to er

## 2018-12-09 NOTE — Progress Notes (Signed)
Virtual Visit via Video Note  I connected with Emily Osborne on 12/09/18 at  2:00 PM EDT by a video enabled telemedicine application and verified that I am speaking with the correct person using two identifiers.   I discussed the limitations of evaluation and management by telemedicine and the availability of in person appointments. The patient expressed understanding and agreed to proceed.  History of Present Illness: Pt is home f/u from ER for chest pain w/u was neg and the day after she went home it went away.  Friday she got sob and then pain was back.   She walked 2 miles   Pt feels fine now and she is back at work.  The cp occurs 2x a week -- it lasts the entire time she was walking --- it went away within 10 min of stopping         Past Medical History:  Diagnosis Date  . Allergy   . Anxiety   . Asthma    pt has inhaler  . Back pain   . C. difficile colitis   . Cataract    bil eyes  . Chest pain   . Chronic fatigue syndrome   . Clostridium difficile colitis 12/13/2016  . Diverticulitis   . Dyspnea   . Dysrhythmia    palpitations  . Edema   . Elevated blood pressure reading without diagnosis of hypertension    "just elevated when I'm in pain" (12/07/2015)  . Fatty liver   . Fibromyalgia   . Ganglion of joint    right wrist  . GERD (gastroesophageal reflux disease)   . H. pylori infection   . Headache    hx of  . Helicobacter pylori (H. pylori) infection 12/13/2016  . Herpes zoster without mention of complication   . HTN (hypertension)   . Hyperlipidemia   . Hyperthyroidism   . IBS (irritable bowel syndrome)   . Leg edema   . Multiple drug allergies 06/18/2017  . Myalgia   . Nontraumatic rupture of Achilles tendon   . Other acute reactions to stress   . Pain in joint, shoulder region   . Pain in joint, upper arm   . Pain in limb   . Palpitations   . Personal history of other diseases of digestive system   . Pneumonia    once  . PONV (postoperative nausea and  vomiting)   . Routine screening for STI (sexually transmitted infection) 12/13/2016  . S/P partial colectomy 06/18/2017  . Thyroid disease    hyperthyroidism  . Type 2 diabetes mellitus with hyperglycemia, without long-term current use of insulin ([place]) 08/07/2018  . Vitamin B 12 deficiency    Current Outpatient Medications on File Prior to Visit  Medication Sig Dispense Refill  . acetaminophen (TYLENOL) 500 MG tablet Take 1,000 mg by mouth every 6 (six) hours as needed for moderate pain.     Marland Kitchen albuterol (PROAIR HFA) 108 (90 Base) MCG/ACT inhaler USE 2 PUFFS EVERY 4 HOURS AS NEEDED FOR COUGH, WHEEZE OR SHORTNESS OF BREATH. 8.5 each 2  . ALPRAZolam (XANAX) 0.25 MG tablet Take 1 tablet (0.25 mg total) by mouth 3 (three) times daily as needed. 30 tablet 2  . atenolol (TENORMIN) 25 MG tablet TAKE 1/2 TABLETS (12.5 MG TOTAL) BY MOUTH DAILY 45 tablet 3  . azelastine (OPTIVAR) 0.05 % ophthalmic solution Place 1 drop into both eyes 2 (two) times daily as needed.    . Blood Glucose Monitoring Suppl (FREESTYLE FREEDOM) KIT  1 Device by Does not apply route daily. 1 each 0  . dexlansoprazole (DEXILANT) 60 MG capsule Take 1 capsule (60 mg total) by mouth daily. 90 capsule 0  . diclofenac sodium (VOLTAREN) 1 % GEL Apply 2 g topically 4 (four) times daily. Rub into affected area of foot 2 to 4 times daily 100 g 2  . fexofenadine (ALLEGRA) 180 MG tablet Take 1 tablet (180 mg total) by mouth daily. 90 tablet 3  . fluticasone (FLONASE) 50 MCG/ACT nasal spray PLACE 2 SPRAYS INTO BOTH NOSTRILS DAILY. 16 g 4  . Fluticasone-Salmeterol (ADVAIR DISKUS) 250-50 MCG/DOSE AEPB Inhale 1 puff into the lungs 2 (two) times daily. 180 each 1  . FREESTYLE LITE test strip USE TO CHECK BLOOD SUGAR ONCE A DAY 100 each 0  . Iron Combinations (I.L.X. B-12) ELIX Take 1 each by mouth daily as needed (energy). 1 droplet full    . lidocaine (LIDODERM) 5 % Apply to affected area QD PRN for a Week 30 patch 0  . metFORMIN (GLUCOPHAGE) 500  MG tablet Take 1 tablet (500 mg total) by mouth daily with breakfast. 30 tablet 0  . valACYclovir (VALTREX) 1000 MG tablet Take 1,000 mg by mouth 3 (three) times daily as needed.    . Vitamin D, Ergocalciferol, (DRISDOL) 1.25 MG (50000 UT) CAPS capsule Take 1 capsule (50,000 Units total) by mouth every 7 (seven) days. 4 capsule 0   No current facility-administered medications on file prior to visit.     Observations/Objective: 112/87 , p 68  Afebrile Pt is in NAD, no sob, no chest pain no palpitations  Er visit reviewed   Assessment and Plan: 1. Chest pain, unspecified type Er visit reviewed ,  W/u neg Advised she f/u cardiology If cp returns go to er   2. Anxiety May be cause of cp but pt denies.   She has xanax she takes very rarely   Follow Up Instructions:    I discussed the assessment and treatment plan with the patient. The patient was provided an opportunity to ask questions and all were answered. The patient agreed with the plan and demonstrated an understanding of the instructions.   The patient was advised to call back or seek an in-person evaluation if the symptoms worsen or if the condition fails to improve as anticipated.  I provided 30 minutes of non-face-to-face time during this encounter.   [name], DO

## 2018-12-10 ENCOUNTER — Other Ambulatory Visit: Payer: Self-pay | Admitting: Emergency Medicine

## 2018-12-10 DIAGNOSIS — E785 Hyperlipidemia, unspecified: Secondary | ICD-10-CM

## 2018-12-10 DIAGNOSIS — E1169 Type 2 diabetes mellitus with other specified complication: Secondary | ICD-10-CM

## 2018-12-10 DIAGNOSIS — I10 Essential (primary) hypertension: Secondary | ICD-10-CM

## 2018-12-10 DIAGNOSIS — E1165 Type 2 diabetes mellitus with hyperglycemia: Secondary | ICD-10-CM

## 2018-12-11 ENCOUNTER — Other Ambulatory Visit: Payer: Self-pay

## 2018-12-11 ENCOUNTER — Other Ambulatory Visit (INDEPENDENT_AMBULATORY_CARE_PROVIDER_SITE_OTHER): Payer: 59

## 2018-12-11 DIAGNOSIS — I10 Essential (primary) hypertension: Secondary | ICD-10-CM | POA: Diagnosis not present

## 2018-12-11 DIAGNOSIS — E785 Hyperlipidemia, unspecified: Secondary | ICD-10-CM | POA: Diagnosis not present

## 2018-12-11 DIAGNOSIS — E1165 Type 2 diabetes mellitus with hyperglycemia: Secondary | ICD-10-CM

## 2018-12-11 DIAGNOSIS — E1169 Type 2 diabetes mellitus with other specified complication: Secondary | ICD-10-CM | POA: Diagnosis not present

## 2018-12-11 LAB — LIPID PANEL
Cholesterol: 203 mg/dL — ABNORMAL HIGH (ref 0–200)
HDL: 50.3 mg/dL (ref >39.00)
LDL Cholesterol: 120 mg/dL — ABNORMAL HIGH (ref 0–99)
NonHDL: 152.21
Total CHOL/HDL Ratio: 4
Triglycerides: 162 mg/dL — ABNORMAL HIGH (ref 0.0–149.0)
VLDL: 32.4 mg/dL (ref 0.0–40.0)

## 2018-12-11 LAB — COMPREHENSIVE METABOLIC PANEL
ALT: 32 U/L (ref 0–35)
AST: 24 U/L (ref 0–37)
Albumin: 4 g/dL (ref 3.5–5.2)
Alkaline Phosphatase: 59 U/L (ref 39–117)
BUN: 18 mg/dL (ref 6–23)
CO2: 26 mEq/L (ref 19–32)
Calcium: 9 mg/dL (ref 8.4–10.5)
Chloride: 105 mEq/L (ref 96–112)
Creatinine, Ser: 0.85 mg/dL (ref 0.40–1.20)
GFR: 82.86 mL/min (ref >60.00)
Glucose, Bld: 103 mg/dL — ABNORMAL HIGH (ref 70–99)
Potassium: 4.2 mEq/L (ref 3.5–5.1)
Sodium: 138 mEq/L (ref 135–145)
Total Bilirubin: 0.5 mg/dL (ref 0.2–1.2)
Total Protein: 6.5 g/dL (ref 6.0–8.3)

## 2018-12-11 LAB — HEMOGLOBIN A1C: Hgb A1c MFr Bld: 6 % (ref 4.6–6.5)

## 2018-12-15 ENCOUNTER — Other Ambulatory Visit: Payer: Self-pay | Admitting: Family Medicine

## 2018-12-15 DIAGNOSIS — E785 Hyperlipidemia, unspecified: Principal | ICD-10-CM

## 2018-12-15 DIAGNOSIS — E1169 Type 2 diabetes mellitus with other specified complication: Secondary | ICD-10-CM

## 2018-12-15 MED ORDER — ROSUVASTATIN CALCIUM 10 MG PO TABS: 10.0000 mg | ORAL_TABLET | Freq: Every day | ORAL | 3 refills | Status: DC

## 2018-12-15 MED FILL — ROSUVASTATIN CALCIUM 10 MG: 10 | 90 days supply | Qty: 90 | Fill #0

## 2018-12-16 ENCOUNTER — Encounter (INDEPENDENT_AMBULATORY_CARE_PROVIDER_SITE_OTHER): Payer: Self-pay | Admitting: Family Medicine

## 2018-12-16 ENCOUNTER — Other Ambulatory Visit: Payer: Self-pay

## 2018-12-16 ENCOUNTER — Ambulatory Visit (INDEPENDENT_AMBULATORY_CARE_PROVIDER_SITE_OTHER): Payer: 59 | Admitting: Family Medicine

## 2018-12-16 DIAGNOSIS — Z6838 Body mass index (BMI) 38.0-38.9, adult: Secondary | ICD-10-CM

## 2018-12-16 DIAGNOSIS — R7303 Prediabetes: Secondary | ICD-10-CM

## 2018-12-16 DIAGNOSIS — E559 Vitamin D deficiency, unspecified: Secondary | ICD-10-CM

## 2018-12-16 MED ORDER — METFORMIN HCL 500 MG PO TABS: 500.0000 mg | ORAL_TABLET | Freq: Every day | ORAL | 0 refills | Status: DC

## 2018-12-16 MED ORDER — VITAMIN D (ERGOCALCIFEROL) 1.25 MG (50000 UNIT) PO CAPS: 50000.0000 [IU] | ORAL_CAPSULE | ORAL | 0 refills | Status: DC

## 2018-12-17 NOTE — Progress Notes (Signed)
Office: [phone]  /  Fax: [phone] TeleHealth Visit:  Emily Osborne has verbally consented to this TeleHealth visit today. The patient is located at home, the provider is located at the [facility] office. The participants in this visit include the listed provider and patient. The visit was conducted today via face time.  HPI:   Chief Complaint: OBESITY Emily Osborne is here to discuss her progress with her obesity treatment plan. She is on the protein rich vegetarian plan and is following her eating plan approximately 70 % of the time. She states she walked 2-4 miles 3-4 times per week. Emily Osborne's daughter who is 35 years old tested positive for the corona virus, and so she has been under a lot of stress. She weighed 236 lbs this morning. She is getting frustrated with lack of weight loss. She is occasionally skipping meals and occasionally indulging. She went to the emergency department 2 weeks ago for chest pain.  We were unable to weigh the patient today for this TeleHealth visit. She feels as if she has gained 5 lbs since her last visit. She has lost 0 lbs since starting treatment with [place].  Vitamin D Deficiency Emily Osborne has a diagnosis of vitamin D deficiency. She is currently taking prescription Vit D. She notes fatigue and denies nausea, vomiting or muscle weakness.  Pre-Diabetes Emily Osborne has a diagnosis of pre-diabetes based on her elevated Hgb A1c of 6.0 and was informed this puts her at greater risk of developing diabetes. She states her BGs range at 88. She is taking metformin currently and notes carbohydrate cravings, but not frequently. She continues to work on diet and exercise to decrease risk of diabetes. She denies nausea or hypoglycemia.  ASSESSMENT AND PLAN:  Vitamin D deficiency - Plan: Vitamin D, Ergocalciferol, (DRISDOL) 1.25 MG (50000 UT) CAPS capsule  Prediabetes - Plan: metFORMIN (GLUCOPHAGE) 500 MG tablet  Class 2 severe obesity with serious  comorbidity and body mass index (BMI) of 38.0 to 38.9 in adult, unspecified obesity type ([place])  PLAN:  Vitamin D Deficiency Emily Osborne was informed that low vitamin D levels contributes to fatigue and are associated with obesity, breast, and colon cancer. Emily Osborne agrees to continue taking prescription Vit D _0 ,000 IU every week #4 and we will refill for 1 month. She will follow up for routine testing of vitamin D, at least 2-3 times per year. She was informed of the risk of over-replacement of vitamin D and agrees to not increase her dose unless she discusses this with [place] first. Emily Osborne agrees to follow up with our clinic in 2 weeks.  Pre-Diabetes Emily Osborne will continue to work on weight loss, exercise, and decreasing simple carbohydrates in her diet to help decrease the risk of diabetes. We dicussed metformin including benefits and risks. She was informed that eating too many simple carbohydrates or too many calories at one sitting increases the likelihood of GI side effects. Emily Osborne agrees to continue taking metformin 500 mg PO q AM #30 and we will refill for 1 month. Emily Osborne agrees to follow up with our clinic in 2 weeks as directed to monitor her progress.  Obesity Emily Osborne is currently in the action stage of change. As such, her goal is to continue with weight loss efforts She has agreed to follow our protein rich vegetarian plan Emily Osborne has been instructed to work up to a goal of 150 minutes of combined cardio and strengthening exercise per week for weight loss and overall health benefits. We discussed the  following Behavioral Modification Strategies today: increasing lean protein intake, increasing vegetables, work on meal planning and easy cooking plans and emotional eating strategies, no skipping meals, better snacking choices, and planning for success   Emily Osborne has agreed to follow up with our clinic in 2 weeks. She was informed of the importance of frequent follow up visits to maximize her success  with intensive lifestyle modifications for her multiple health conditions.  ALLERGIES: Allergies  Allergen Reactions  . Aspirin Other (See Comments)    REACTION: anaphylaxis  . Gentamicin Other (See Comments)    Eye drops turned the sclera bright red  . Ibuprofen Other (See Comments)    REACTION: anaphylaxsis  . Metronidazole Swelling    REACTION: red face/swelling  . Nsaids Other (See Comments)    REACTION: anaphylaxis  . Doxycycline Other (See Comments)    REACTION: severe nausea/vomiting  . Influenza A (H1n1) [place] Vac Other (See Comments)    REACTION: sick for 3 weeks  . Loratadine Other (See Comments)    Fatigue/weakness  . Periactin [Cyproheptadine] Other (See Comments)    paranoid   . Ranitidine Hcl Other (See Comments)    REACTION: Lips turned red /peel  . Sulfamethoxazole-Trimethoprim Other (See Comments)    REACTION: face red/peel  . Tramadol Hcl Other (See Comments)    REACTION: paranoid    MEDICATIONS: Current Outpatient Medications on File Prior to Visit  Medication Sig Dispense Refill  . acetaminophen (TYLENOL) 500 MG tablet Take 1,000 mg by mouth every 6 (six) hours as needed for moderate pain.     Marland Kitchen albuterol (PROAIR HFA) 108 (90 Base) MCG/ACT inhaler USE 2 PUFFS EVERY 4 HOURS AS NEEDED FOR COUGH, WHEEZE OR SHORTNESS OF BREATH. 8.5 each 2  . ALPRAZolam (XANAX) 0.25 MG tablet Take 1 tablet (0.25 mg total) by mouth 3 (three) times daily as needed. 30 tablet 2  . atenolol (TENORMIN) 25 MG tablet TAKE 1/2 TABLETS (12.5 MG TOTAL) BY MOUTH DAILY 45 tablet 3  . azelastine (OPTIVAR) 0.05 % ophthalmic solution Place 1 drop into both eyes 2 (two) times daily as needed.    . Blood Glucose Monitoring Suppl (FREESTYLE FREEDOM) KIT 1 Device by Does not apply route daily. 1 each 0  . dexlansoprazole (DEXILANT) 60 MG capsule Take 1 capsule (60 mg total) by mouth daily. 90 capsule 0  . diclofenac sodium (VOLTAREN) 1 % GEL Apply 2 g topically 4 (four) times daily. Rub into  affected area of foot 2 to 4 times daily 100 g 2  . fexofenadine (ALLEGRA) 180 MG tablet Take 1 tablet (180 mg total) by mouth daily. 90 tablet 3  . fluticasone (FLONASE) 50 MCG/ACT nasal spray PLACE 2 SPRAYS INTO BOTH NOSTRILS DAILY. 16 g 4  . Fluticasone-Salmeterol (ADVAIR DISKUS) 250-50 MCG/DOSE AEPB Inhale 1 puff into the lungs 2 (two) times daily. 180 each 1  . FREESTYLE LITE test strip USE TO CHECK BLOOD SUGAR ONCE A DAY 100 each 0  . Iron Combinations (I.L.X. B-12) ELIX Take 1 each by mouth daily as needed (energy). 1 droplet full    . lidocaine (LIDODERM) 5 % Apply to affected area QD PRN for a Week 30 patch 0  . rosuvastatin (CRESTOR) 10 MG tablet Take 1 tablet (10 mg total) by mouth daily. 90 tablet 3  . valACYclovir (VALTREX) 1000 MG tablet Take 1,000 mg by mouth 3 (three) times daily as needed.     No current facility-administered medications on file prior to visit.  PAST MEDICAL HISTORY: Past Medical History:  Diagnosis Date  . Allergy   . Anxiety   . Asthma    pt has inhaler  . Back pain   . C. difficile colitis   . Cataract    bil eyes  . Chest pain   . Chronic fatigue syndrome   . Clostridium difficile colitis 12/13/2016  . Diverticulitis   . Dyspnea   . Dysrhythmia    palpitations  . Edema   . Elevated blood pressure reading without diagnosis of hypertension    "just elevated when I'm in pain" (12/07/2015)  . Fatty liver   . Fibromyalgia   . Ganglion of joint    right wrist  . GERD (gastroesophageal reflux disease)   . H. pylori infection   . Headache    hx of  . Helicobacter pylori (H. pylori) infection 12/13/2016  . Herpes zoster without mention of complication   . HTN (hypertension)   . Hyperlipidemia   . Hyperthyroidism   . IBS (irritable bowel syndrome)   . Leg edema   . Multiple drug allergies 06/18/2017  . Myalgia   . Nontraumatic rupture of Achilles tendon   . Other acute reactions to stress   . Pain in joint, shoulder region   . Pain in  joint, upper arm   . Pain in limb   . Palpitations   . Personal history of other diseases of digestive system   . Pneumonia    once  . PONV (postoperative nausea and vomiting)   . Routine screening for STI (sexually transmitted infection) 12/13/2016  . S/P partial colectomy 06/18/2017  . Thyroid disease    hyperthyroidism  . Type 2 diabetes mellitus with hyperglycemia, without long-term current use of insulin ([place]) 08/07/2018  . Vitamin B 12 deficiency     PAST SURGICAL HISTORY: Past Surgical History:  Procedure Laterality Date  . ABDOMINAL HYSTERECTOMY    . ACHILLES TENDON REPAIR Right 2005  . BUNIONECTOMY Bilateral    Bunionectomy 1983  . COLON SURGERY  01/23/2017   6 to 8 inches sigmoid colon removed  . COLONOSCOPY    . GANGLION CYST EXCISION Right    wrist  . LEFT HEART CATH AND CORONARY ANGIOGRAPHY N/A 10/03/2016   Procedure: Left Heart Cath and Coronary Angiography;  Surgeon: Burnell Blanks, MD;  Location: [facility];  Service: Cardiovascular;  Laterality: N/A;  . MENISCUS REPAIR Right 04/2014  . POLYPECTOMY    . TUBAL LIGATION    . wisdomteeth extraction      SOCIAL HISTORY: Social History   Tobacco Use  . Smoking status: Never Smoker  . Smokeless tobacco: Never Used  Substance Use Topics  . Alcohol use: No    Alcohol/week: 0.0 standard drinks  . Drug use: No    FAMILY HISTORY: Family History  Problem Relation Age of Onset  . Hypertension Father   . Diabetes Father   . Heart disease Father   . Kidney disease Father        Died, 36  . Hyperlipidemia Father   . Leukemia Brother   . Cancer Brother        myleoblastic anemia  . Hyperlipidemia Mother   . Hypertension Mother        Died, 60  . Thyroid disease Mother        Thyroid surgery  . Obesity Mother   . Heart disease Mother   . Pernicious anemia Sister   . Thyroid disease Sister  On thyroid Rx  . Lupus Daughter   . Coronary artery disease Brother   . Hypertension  Brother   . Colon cancer Neg Hx   . Esophageal cancer Neg Hx   . Rectal cancer Neg Hx   . Stomach cancer Neg Hx   . Pancreatic cancer Neg Hx   . Prostate cancer Neg Hx   . Colon polyps Neg Hx     ROS: Review of Systems  Constitutional: Positive for malaise/fatigue. Negative for weight loss.  Gastrointestinal: Negative for nausea and vomiting.  Musculoskeletal:       Negative muscle weakness  Endo/Heme/Allergies:       Negative hypoglycemia    PHYSICAL EXAM: Pt in no acute distress  RECENT LABS AND TESTS: BMET    Component Value Date/Time   NA 138 12/11/2018 0915   NA 142 03/20/2018 1046   K 4.2 12/11/2018 0915   CL 105 12/11/2018 0915   CO2 26 12/11/2018 0915   GLUCOSE 103 (H) 12/11/2018 0915   BUN 18 12/11/2018 0915   BUN 17 03/20/2018 1046   CREATININE 0.85 12/11/2018 0915   CREATININE 0.97 03/23/2016 1501   CALCIUM 9.0 12/11/2018 0915   GFRNONAA >60 11/27/2018 1144   GFRAA >60 11/27/2018 1144   Lab Results  Component Value Date   HGBA1C 6.0 12/11/2018   HGBA1C 5.8 08/07/2018   HGBA1C 6.0 05/13/2018   HGBA1C 5.6 03/20/2018   HGBA1C 5.8 (H) 12/12/2017   Lab Results  Component Value Date   INSULIN 20.0 03/20/2018   INSULIN 17.6 12/12/2017   CBC    Component Value Date/Time   WBC 5.2 11/27/2018 1144   RBC 4.70 11/27/2018 1144   HGB 13.5 11/27/2018 1144   HGB 13.5 12/12/2017 1102   HCT 41.6 11/27/2018 1144   HCT 41.8 12/12/2017 1102   PLT 253 11/27/2018 1144   MCV 88.5 11/27/2018 1144   MCV 89 12/12/2017 1102   MCH 28.7 11/27/2018 1144   MCHC 32.5 11/27/2018 1144   RDW 14.9 11/27/2018 1144   RDW 15.1 12/12/2017 1102   LYMPHSABS 2.3 08/07/2018 1208   LYMPHSABS 2.3 12/12/2017 1102   MONOABS 0.5 08/07/2018 1208   EOSABS 0.1 08/07/2018 1208   EOSABS 0.1 12/12/2017 1102   BASOSABS 0.0 08/07/2018 1208   BASOSABS 0.0 12/12/2017 1102   Iron/TIBC/Ferritin/ %Sat No results found for: IRON, TIBC, FERRITIN, IRONPCTSAT Lipid Panel     Component Value  Date/Time   CHOL 203 (H) 12/11/2018 0915   CHOL 197 03/20/2018 1046   TRIG 162.0 (H) 12/11/2018 0915   HDL 50.30 12/11/2018 0915   HDL 57 03/20/2018 1046   CHOLHDL 4 12/11/2018 0915   VLDL 32.4 12/11/2018 0915   LDLCALC 120 (H) 12/11/2018 0915   LDLCALC 124 (H) 03/20/2018 1046   LDLDIRECT 139.4 12/26/2011 0905   Hepatic Function Panel     Component Value Date/Time   PROT 6.5 12/11/2018 0915   PROT 6.8 03/20/2018 1046   ALBUMIN 4.0 12/11/2018 0915   ALBUMIN 4.4 03/20/2018 1046   AST 24 12/11/2018 0915   ALT 32 12/11/2018 0915   ALKPHOS 59 12/11/2018 0915   BILITOT 0.5 12/11/2018 0915   BILITOT 0.4 03/20/2018 1046   BILIDIR 0.1 09/07/2014 1031      Component Value Date/Time   TSH 0.87 10/07/2018 0953   TSH 0.44 08/07/2018 1208   TSH 0.67 06/05/2018 1638      I, Trixie Dredge, am acting as transcriptionist for Ilene Qua, MD  I have reviewed  the above documentation for accuracy and completeness, and I agree with the above. - Ilene Qua, MD

## 2018-12-20 ENCOUNTER — Telehealth: Payer: Self-pay | Admitting: Physician Assistant

## 2018-12-20 NOTE — Telephone Encounter (Signed)
Patient paged after hour service complaining of worsening DOE in the past several days. Her daughter was diagnosed with COVID19, she is a Marine scientist. She has been having some wheezing, but no fever or chill. She has intermittent CP. Since she had a clean cath in 09/2016, doubt significant CAD. However unclear if her dyspnea is caused by pulmonary, cardiac or COVID19. SHe was seen in April in ED for chest pain. She is aware that if her symtom improve, she can see Dr. Harrell Gave next Tue and have outpatient echo. However if symptom persist or worsens, she needs to be evaluated in ED.

## 2018-12-22 ENCOUNTER — Telehealth: Payer: Self-pay | Admitting: Cardiology

## 2018-12-23 ENCOUNTER — Encounter: Payer: Self-pay | Admitting: Cardiology

## 2018-12-23 ENCOUNTER — Telehealth: Payer: Self-pay | Admitting: *Deleted

## 2018-12-23 ENCOUNTER — Encounter: Payer: Self-pay | Admitting: *Deleted

## 2018-12-23 ENCOUNTER — Telehealth (INDEPENDENT_AMBULATORY_CARE_PROVIDER_SITE_OTHER): Payer: 59 | Admitting: Cardiology

## 2018-12-23 VITALS — BP 92/70 | HR 103 | Ht 65.0 in | Wt 233.6 lb

## 2018-12-23 DIAGNOSIS — R072 Precordial pain: Secondary | ICD-10-CM

## 2018-12-23 DIAGNOSIS — Z7189 Other specified counseling: Secondary | ICD-10-CM | POA: Diagnosis not present

## 2018-12-23 DIAGNOSIS — R0609 Other forms of dyspnea: Secondary | ICD-10-CM | POA: Diagnosis not present

## 2018-12-23 NOTE — Patient Instructions (Signed)
Medication Instructions:  Your Physician recommend you continue on your current medication as directed.    If you need a refill on your cardiac medications before your next appointment, please call your pharmacy.   Lab work: None  Testing/Procedures: Your physician has requested that you have an echocardiogram. Echocardiography is a painless test that uses sound waves to create images of your heart. It provides your doctor with information about the size and shape of your heart and how well your heart's chambers and valves are working. This procedure takes approximately one hour. There are no restrictions for this procedure. [place] [address]   Follow-Up: At [facility], you and your health needs are our priority.  As part of our continuing mission to provide you with exceptional heart care, we have created designated Provider Care Teams.  These Care Teams include your primary Cardiologist (physician) and Advanced Practice Providers (APPs -  Physician Assistants and Nurse Practitioners) who all work together to provide you with the care you need, when you need it. You will need a follow up appointment in 3 months.  Please call our office 2 months in advance to schedule this appointment.  You may see Buford Dresser, MD or one of the following Advanced Practice Providers on your designated Care Team:   Rosaria Ferries, PA-C . Jory Sims, DNP, ANP

## 2018-12-23 NOTE — Progress Notes (Signed)
Virtual Visit via Video Note   This visit type was conducted due to national recommendations for restrictions regarding the COVID-19 Pandemic (e.g. social distancing) in an effort to limit this patient's exposure and mitigate transmission in our community.  Due to her co-morbid illnesses, this patient is at least at moderate risk for complications without adequate follow up.  This format is felt to be most appropriate for this patient at this time.  All issues noted in this document were discussed and addressed.  A limited physical exam was performed with this format.  Please refer to the patient's chart for her consent to [facility].   Date:  12/23/2018   ID:  Emily, Osborne Apr 04, 1961, MRN [id number]  Patient Location: Home Provider Location: Home  PCP:  Carollee Herter, Alferd Apa, DO  Cardiologist:  Buford Dresser, MD  Electrophysiologist:  None   Evaluation Performed:  Follow-Up Visit  Chief Complaint:  Shortness of breath  History of Present Illness:    Emily Osborne is a 58 y.o. female with PMH chronic precordial pain, multiple medication allergies, full PMH as below.  She notes several weeks of worsening shortness of breath, which peaked last week. She was concerned as her daughter has been diagnosed with COVID. She denies fevers/chills, productive cough. May have had some wheezing.   She had the worst day 5/1-5/2, when her shortness of breath seemed to worsen, and at that time she called the on call service. She notes that her precordial pain is unchanged during this event, still left pectoral area (see full prior workup for this). Denies PND, orthopnea, LE edema, syncope. She does note that her weight peaked around 238 lbs 5/1-5/2 with the worst of her symptoms, and she is 233 lbs today. Has been eating overall well she feels while social distancing, but activity level is down. 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   We reviewed her prior clean cath in 2018 and echo in 2017.   Past Medical History:  Diagnosis Date   Allergy    Anxiety    Asthma    pt has inhaler   Back pain    C. difficile colitis    Cataract    bil eyes   Chest pain    Chronic fatigue syndrome    Clostridium difficile colitis 12/13/2016   Diverticulitis    Dyspnea    Dysrhythmia    palpitations   Edema    Elevated blood pressure reading without diagnosis of hypertension    "just elevated when I'm in pain" (12/07/2015)   Fatty liver    Fibromyalgia    Ganglion of joint    right wrist   GERD (gastroesophageal reflux disease)    H. pylori infection    Headache    hx of   Helicobacter pylori (H. pylori) infection 12/13/2016   Herpes zoster without mention of complication    HTN (hypertension)    Hyperlipidemia    Hyperthyroidism    IBS (irritable bowel syndrome)    Leg edema    Multiple drug allergies 06/18/2017   Myalgia    Nontraumatic rupture of Achilles tendon    Other acute reactions to stress    Pain in joint, shoulder region    Pain in joint, upper arm    Pain in limb    Palpitations    Personal history of other diseases of digestive system    Pneumonia  once   PONV (postoperative nausea and vomiting)    Routine screening for STI (sexually transmitted infection) 12/13/2016   S/P partial colectomy 06/18/2017   Thyroid disease    hyperthyroidism   Type 2 diabetes mellitus with hyperglycemia, without long-term current use of insulin ([place]) 08/07/2018   Vitamin B 12 deficiency    Past Surgical History:  Procedure Laterality Date   ABDOMINAL HYSTERECTOMY     ACHILLES TENDON REPAIR Right 2005   BUNIONECTOMY Bilateral    Bunionectomy 1983   COLON SURGERY  01/23/2017   6 to 8 inches sigmoid colon removed   COLONOSCOPY     GANGLION CYST EXCISION Right    wrist   LEFT HEART CATH AND CORONARY ANGIOGRAPHY N/A 10/03/2016   Procedure: Left Heart  Cath and Coronary Angiography;  Surgeon: Burnell Blanks, MD;  Location: [facility];  Service: Cardiovascular;  Laterality: N/A;   MENISCUS REPAIR Right 04/2014   POLYPECTOMY     TUBAL LIGATION     wisdomteeth extraction       No outpatient medications have been marked as taking for the 12/23/18 encounter (Telemedicine) with Buford Dresser, MD.     Allergies:   Aspirin; Gentamicin; Ibuprofen; Metronidazole; Nsaids; Doxycycline; Influenza a (h1n1) monoval vac; Loratadine; Periactin [cyproheptadine]; Ranitidine hcl; Sulfamethoxazole-trimethoprim; and Tramadol hcl   Social History   Tobacco Use   Smoking status: Never Smoker   Smokeless tobacco: Never Used  Substance Use Topics   Alcohol use: No    Alcohol/week: 0.0 standard drinks   Drug use: No     Family Hx: The patient's family history includes Cancer in her brother; Coronary artery disease in her brother; Diabetes in her father; Heart disease in her father and mother; Hyperlipidemia in her father and mother; Hypertension in her brother, father, and mother; Kidney disease in her father; Leukemia in her brother; Lupus in her daughter; Obesity in her mother; Pernicious anemia in her sister; Thyroid disease in her mother and sister. There is no history of Colon cancer, Esophageal cancer, Rectal cancer, Stomach cancer, Pancreatic cancer, Prostate cancer, or Colon polyps.  ROS:   Please see the history of present illness.    Constitutional: Negative for chills, fever, night sweats, unintentional weight loss  HENT: Negative for ear pain and hearing loss.   Eyes: Negative for loss of vision and eye pain.  Respiratory: See HPI Cardiovascular: See HPI. Gastrointestinal: Negative for abdominal pain, melena, and hematochezia.  Genitourinary: Negative for dysuria and hematuria.  Musculoskeletal: Negative for falls and myalgias.  Skin: Negative for itching and rash.  Neurological: Negative for focal weakness,  focal sensory changes and loss of consciousness.  Endo/Heme/Allergies: Does not bruise/bleed easily.  All other systems reviewed and are negative.   Prior CV studies:   The following studies were reviewed today: Prior cath, echo, monitor  Labs/Other Tests and Data Reviewed:    EKG:  An ECG dated 11/27/18 was personally reviewed today and demonstrated:  NSR, hr 63  Recent Labs: 10/07/2018: TSH 0.87 11/27/2018: Hemoglobin 13.5; Platelets 253 12/11/2018: ALT 32; BUN 18; Creatinine, Ser 0.85; Potassium 4.2; Sodium 138   Recent Lipid Panel Lab Results  Component Value Date/Time   CHOL 203 (H) 12/11/2018 09:15 AM   CHOL 197 03/20/2018 10:46 AM   TRIG 162.0 (H) 12/11/2018 09:15 AM   HDL 50.30 12/11/2018 09:15 AM   HDL 57 03/20/2018 10:46 AM   CHOLHDL 4 12/11/2018 09:15 AM   LDLCALC 120 (H) 12/11/2018 09:15 AM   LDLCALC  124 (H) 03/20/2018 10:46 AM   LDLDIRECT 139.4 12/26/2011 09:05 AM    Wt Readings from Last 3 Encounters:  12/23/18 233 lb 9.6 oz (106 kg)  11/27/18 230 lb (104.3 kg)  10/16/18 227 lb (103 kg)     Objective:    Vital Signs:  BP 92/70    Pulse (!) 103    Ht 5\' 5"  (1.651 m)    Wt 233 lb 9.6 oz (106 kg)    BMI 38.87 kg/m    VITAL SIGNS:  reviewed GEN:  no acute distress EYES:  sclerae anicteric, EOMI - Extraocular Movements Intact RESPIRATORY:  normal respiratory effort, symmetric expansion CARDIOVASCULAR:  No JVD appreciated SKIN:  no rash, lesions or ulcers. NEURO:  alert and oriented x 3, no obvious focal deficit PSYCH:  normal affect  ASSESSMENT & PLAN:    Dyspnea on exertion: her initial concern was for COVID given her daughter's diagnosis, but she has no infectious symptoms. No clear swelling, but weight was up and then down significantly in a short period of time -last echo in 2017, with worsening symptoms will re-evaluate -if echo unremarkable, recommend she speak to her PCP to discuss PFTs. -with clean cath and no change in precordial pain, low  suspicion for this being CAD  -ok to continue with her day to day activity. If her symptoms worsen with exertion, she will contact us.  Precordial pain: stable, clean cath in 2018, recent monitor unremarkable. Not suggestive of cardiac etiology.  Primary prevention (no evidence of CAD on cath) -recommend heart healthy/Mediterranean diet, with whole grains, fruits, vegetable, fish, lean meats, nuts, and olive oil. Limit salt. -recommend moderate walking, 3-5 times/week for 30-50 minutes each session. Aim for at least 150 minutes.week. Goal should be pace of 3 miles/hours, or walking 1.5 miles in 30 minutes -recommend avoidance of tobacco products. Avoid excess alcohol. -Additional risk factor control:             -Diabetes: A1c is 6.0, on metformin for weight loss             -Lipids: LDL 124, but in low risk category. Lifestyle for now, consider statin if risk elevates or develops diabetes             -Blood pressure control: on atenolol for symptoms, denies HTN. Low BP today             -Weight: BMI 38, counseled on lifestyle The 10-year ASCVD risk score Mikey Bussing DC Jr., et al., 2013) is: 5.5%   Values used to calculate the score:     Age: 35 years     Sex: Female     Is Non-Hispanic African American: Yes     Diabetic: Yes     Tobacco smoker: No     Systolic Blood Pressure: 92 mmHg     Is BP treated: Yes     HDL Cholesterol: 50.3 mg/dL     Total Cholesterol: 203 mg/dL  COVID-19 Education: The signs and symptoms of COVID-19 were discussed with the patient and how to seek care for testing (follow up with PCP or arrange E-visit).  The importance of social distancing was discussed today.  Time:   Today, I have spent 28 minutes with the patient with telehealth technology discussing the above problems.    Patient Instructions  Medication Instructions:  Your Physician recommend you continue on your current medication as directed.    If you need a refill on your cardiac medications before  your next  appointment, please call your pharmacy.   Lab work: None  Testing/Procedures: Your physician has requested that you have an echocardiogram. Echocardiography is a painless test that uses sound waves to create images of your heart. It provides your doctor with information about the size and shape of your heart and how well your hearts chambers and valves are working. This procedure takes approximately one hour. There are no restrictions for this procedure. [place] [address]   Follow-Up: At [facility], you and your health needs are our priority.  As part of our continuing mission to provide you with exceptional heart care, we have created designated Provider Care Teams.  These Care Teams include your primary Cardiologist (physician) and Advanced Practice Providers (APPs -  Physician Assistants and Nurse Practitioners) who all work together to provide you with the care you need, when you need it. You will need a follow up appointment in 3 months.  Please call our office 2 months in advance to schedule this appointment.  You may see Buford Dresser, MD or one of the following Advanced Practice Providers on your designated Care Team:   Rosaria Ferries, PA-C  Jory Sims, DNP, ANP       Medication Adjustments/Labs and Tests Ordered: Current medicines are reviewed at length with the patient today.  Concerns regarding medicines are outlined above.   Tests Ordered: Orders Placed This Encounter  Procedures   ECHOCARDIOGRAM COMPLETE    Medication Changes: No orders of the defined types were placed in this encounter.   Disposition:  Follow up 3 mos  Signed, Buford Dresser, MD  12/23/2018 10:18 PM    [place]

## 2018-12-23 NOTE — Telephone Encounter (Signed)
Cardiac Questionnaire:    Since your last visit or hospitalization:    1. Have you been having new or worsening chest pain? YES   2. Have you been having new or worsening shortness of breath? YES 3. Have you been having new or worsening leg swelling, wt gain, or increase in abdominal girth (pants fitting more tightly)? YES   4. Have you had any passing out spells? NO    *A YES to any of these questions would result in the appointment being kept. *If all the answers to these questions are NO, we should indicate that given the current situation regarding the worldwide coronarvirus pandemic, at the recommendation of the CDC, we are looking to limit gatherings in our waiting area, and thus will reschedule their appointment beyond four weeks from today.   _____________   [id number] Pre-Screening Questions:  . Do you currently have a fever? YES . Have you recently travelled on a cruise, internationally, or to [place], [place], [place], [place], [place], or [place], [place] [employer])? NO . Have you been in contact with someone that is currently pending confirmation of Covid19 testing or has been confirmed to have the [address] virus?  NO . Are you currently experiencing fatigue or cough? Some coughing and very fatigue lately.              Virtual Visit Pre-Appointment Phone Call  "(Name), I am calling you today to discuss your upcoming appointment. We are currently trying to limit exposure to the virus that causes COVID-19 by seeing patients at home rather than in the office."  1. "What is the BEST phone number to call the day of the visit?" - include this in appointment notes  2. "Do you have or have access to (through a family member/friend) a smartphone with video capability that we can use for your visit?" a. If yes - list this number in appt notes as "cell" (if different from BEST phone #) and list the appointment type as a VIDEO visit in appointment notes b. If no - list the appointment type as a  PHONE visit in appointment notes  3. Confirm consent - "In the setting of the current Covid19 crisis, you are scheduled for a (phone or video) visit with your provider on (date) at (time).  Just as we do with many in-office visits, in order for you to participate in this visit, we must obtain consent.  If you'd like, I can send this to your mychart (if signed up) or email for you to review.  Otherwise, I can obtain your verbal consent now.  All virtual visits are billed to your insurance company just like a normal visit would be.  By agreeing to a virtual visit, we'd like you to understand that the technology does not allow for your provider to perform an examination, and thus may limit your provider's ability to fully assess your condition. If your provider identifies any concerns that need to be evaluated in person, we will make arrangements to do so.  Finally, though the technology is pretty good, we cannot assure that it will always work on either your or our end, and in the setting of a video visit, we may have to convert it to a phone-only visit.  In either situation, we cannot ensure that we have a secure connection.  Are you willing to proceed?" STAFF: Did the patient verbally acknowledge consent to telehealth visit? Document YES/NO here: YES  4. Advise patient to be prepared - "Two hours  prior to your appointment, go ahead and check your blood pressure, pulse, oxygen saturation, and your weight (if you have the equipment to check those) and write them all down. When your visit starts, your provider will ask you for this information. If you have an Apple Watch or Kardia device, please plan to have heart rate information ready on the day of your appointment. Please have a pen and paper handy nearby the day of the visit as well."  5. Give patient instructions for MyChart download to smartphone OR Doximity/Doxy.me as below if video visit (depending on what platform provider is using)  6. Inform patient  they will receive a phone call 15 minutes prior to their appointment time (may be from unknown caller ID) so they should be prepared to answer    TELEPHONE CALL NOTE  Emily Osborne has been deemed a candidate for a follow-up tele-health visit to limit community exposure during the Covid-19 pandemic. I spoke with the patient via phone to ensure availability of phone/video source, confirm preferred email & phone number, and discuss instructions and expectations.  I reminded Emily Osborne to be prepared with any vital sign and/or heart rhythm information that could potentially be obtained via home monitoring, at the time of her visit. I reminded Emily Osborne to expect a phone call prior to her visit.  Venetia Maxon, CMA 12/23/2018 8:14 AM   INSTRUCTIONS FOR DOWNLOADING THE MYCHART APP TO SMARTPHONE  - The patient must first make sure to have activated MyChart and know their login information - If Apple, go to [employer] and type in MyChart in the search bar and download the app. If Android, ask patient to go to [employer] and type in [place] in the search bar and download the app. The app is free but as with any other app downloads, their phone may require them to verify saved payment information or Apple/Android password.  - The patient will need to then log into the app with their MyChart username and password, and select [facility] as their healthcare provider to link the account. When it is time for your visit, go to the MyChart app, find appointments, and click Begin Video Visit. Be sure to Select Allow for your device to access the Microphone and Camera for your visit. You will then be connected, and your provider will be with you shortly.  **If they have any issues connecting, or need assistance please contact MyChart service desk [phone])**  **If using a computer, in order to ensure the best quality for their visit they will need to use either of the  following Internet Browsers: [employer], or Google Chrome**  IF USING DOXIMITY or DOXY.ME - The patient will receive a link just prior to their visit by text.     FULL LENGTH CONSENT FOR TELE-HEALTH VISIT   I hereby voluntarily request, consent and authorize [facility] and its employed or contracted physicians, physician assistants, nurse practitioners or other licensed health care professionals (the Practitioner), to provide me with telemedicine health care services (the "Services") as deemed necessary by the treating Practitioner. I acknowledge and consent to receive the Services by the Practitioner via telemedicine. I understand that the telemedicine visit will involve communicating with the Practitioner through live audiovisual communication technology and the disclosure of certain medical information by electronic transmission. I acknowledge that I have been given the opportunity to request an in-person assessment or other available alternative prior to the telemedicine visit and  am voluntarily participating in the telemedicine visit.  I understand that I have the right to withhold or withdraw my consent to the use of telemedicine in the course of my care at any time, without affecting my right to future care or treatment, and that the Practitioner or I may terminate the telemedicine visit at any time. I understand that I have the right to inspect all information obtained and/or recorded in the course of the telemedicine visit and may receive copies of available information for a reasonable fee.  I understand that some of the potential risks of receiving the Services via telemedicine include:  Marland Kitchen Delay or interruption in medical evaluation due to technological equipment failure or disruption; . Information transmitted may not be sufficient (e.g. poor resolution of images) to allow for appropriate medical decision making by the Practitioner; and/or  . In rare instances, security protocols  could fail, causing a breach of personal health information.  Furthermore, I acknowledge that it is my responsibility to provide information about my medical history, conditions and care that is complete and accurate to the best of my ability. I acknowledge that Practitioner's advice, recommendations, and/or decision may be based on factors not within their control, such as incomplete or inaccurate data provided by me or distortions of diagnostic images or specimens that may result from electronic transmissions. I understand that the practice of medicine is not an exact science and that Practitioner makes no warranties or guarantees regarding treatment outcomes. I acknowledge that I will receive a copy of this consent concurrently upon execution via email to the email address I last provided but may also request a printed copy by calling the office of [facility].    I understand that my insurance will be billed for this visit.   I have read or had this consent read to me. . I understand the contents of this consent, which adequately explains the benefits and risks of the Services being provided via telemedicine.  . I have been provided ample opportunity to ask questions regarding this consent and the Services and have had my questions answered to my satisfaction. . I give my informed consent for the services to be provided through the use of telemedicine in my medical care  By participating in this telemedicine visit I agree to the above.

## 2018-12-25 ENCOUNTER — Telehealth: Payer: Self-pay | Admitting: Cardiology

## 2018-12-25 ENCOUNTER — Encounter: Payer: Self-pay | Admitting: Family Medicine

## 2018-12-25 NOTE — Telephone Encounter (Signed)
New Message    Pt is calling and is wanting to schedule her echo but she says the Dr told her she can have it in 1-2 weeks     Please call

## 2018-12-25 NOTE — Telephone Encounter (Signed)
Spoke with patient and she stated she was told that the Echo would be done within a week. She continues to have shortness of breath and is following up on Echo. Advised patient would send message to schedulers for them to reach out to patient.

## 2018-12-26 ENCOUNTER — Telehealth (HOSPITAL_COMMUNITY): Payer: Self-pay

## 2018-12-26 ENCOUNTER — Telehealth: Payer: Self-pay | Admitting: *Deleted

## 2018-12-26 ENCOUNTER — Other Ambulatory Visit: Payer: Self-pay | Admitting: Family Medicine

## 2018-12-26 DIAGNOSIS — J4540 Moderate persistent asthma, uncomplicated: Secondary | ICD-10-CM

## 2018-12-26 NOTE — Telephone Encounter (Signed)
There is also a mychart message from pt asking about getting PFTs.  Copied from [place] [phone]. Topic: Referral - Request for Referral >> Dec 25, 2018  3:24 PM Yvette Rack wrote: Has patient seen PCP for this complaint? yes  *If NO, is insurance requiring patient see PCP for this issue before PCP can refer them? Referral for which specialty: Pulmonary Preferred provider/office: Pt requests a referral to a pulmonologist with [facility]  Reason for referral: Pt stated the pulmonologist that she was seeing only does sleep studies now so she needs a referral for another pulmonologist in [place]

## 2018-12-26 NOTE — Telephone Encounter (Signed)
Patient also has a Pharmacist, community message as well.

## 2018-12-26 NOTE — Telephone Encounter (Signed)
Left message to call back in regards to echo appointment.

## 2018-12-26 NOTE — Telephone Encounter (Signed)
Referral put in.

## 2018-12-26 NOTE — Telephone Encounter (Signed)
ECHO scheduled for 5/12.

## 2018-12-29 ENCOUNTER — Other Ambulatory Visit: Payer: Self-pay

## 2018-12-29 ENCOUNTER — Encounter (INDEPENDENT_AMBULATORY_CARE_PROVIDER_SITE_OTHER): Payer: Self-pay | Admitting: Family Medicine

## 2018-12-29 ENCOUNTER — Ambulatory Visit (INDEPENDENT_AMBULATORY_CARE_PROVIDER_SITE_OTHER): Payer: 59 | Admitting: Family Medicine

## 2018-12-29 DIAGNOSIS — E66812 Obesity, class 2: Secondary | ICD-10-CM

## 2018-12-29 DIAGNOSIS — R7303 Prediabetes: Secondary | ICD-10-CM

## 2018-12-29 DIAGNOSIS — Z6838 Body mass index (BMI) 38.0-38.9, adult: Secondary | ICD-10-CM | POA: Diagnosis not present

## 2018-12-29 DIAGNOSIS — E7849 Other hyperlipidemia: Secondary | ICD-10-CM | POA: Diagnosis not present

## 2018-12-30 ENCOUNTER — Ambulatory Visit: Payer: Self-pay | Admitting: Podiatry

## 2018-12-30 ENCOUNTER — Other Ambulatory Visit: Payer: Self-pay

## 2018-12-30 ENCOUNTER — Ambulatory Visit (HOSPITAL_COMMUNITY): Payer: 59 | Attending: Cardiology

## 2018-12-30 ENCOUNTER — Other Ambulatory Visit: Payer: 59 | Admitting: Orthotics

## 2018-12-30 DIAGNOSIS — R0609 Other forms of dyspnea: Secondary | ICD-10-CM | POA: Diagnosis not present

## 2018-12-30 NOTE — Progress Notes (Signed)
Office: [phone]  /  Fax: [phone] TeleHealth Visit:  Emily Osborne has verbally consented to this TeleHealth visit today. The patient is located at home, the provider is located at the [employer] [facility] office. The participants in this visit include the listed provider and patient. The visit was conducted today via face time.  HPI:   Chief Complaint: OBESITY Emily Osborne is here to discuss her progress with her obesity treatment plan. She is on the protein rich vegetarian plan and is following her eating plan approximately 70-80 % of the time. She states she is exercising 0 minutes 0 times per week. Emily Osborne weighed in at 234 lbs today. She has struggled with shortness of breath in the past few weeks, and is having an echocardiogram tomorrow. She has started eating 2 times a day and is trying to get most of the food in during those 2 meals. She is eating 2 scrambled eggs in the morning, 2 strips of bacon, and toast. She is eating meat with 2 veggies for second meal.  We were unable to weigh the patient today for this TeleHealth visit. She feels as if she has lost 2 lbs since her last visit. She has lost 0 lbs since starting treatment with [place].  Pre-Diabetes Emily Osborne has a diagnosis of pre-diabetes based on her elevated Hgb A1c and was informed this puts her at greater risk of developing diabetes. She notes cravings for grapes, bananas, or some type of fruit. She denies GI side effects of metformin and continues to work on diet and exercise to decrease risk of diabetes. She denies hypoglycemia.    Hyperlipidemia Emily Osborne has hyperlipidemia and has been trying to improve her cholesterol levels with intensive lifestyle modification including a low saturated fat diet, exercise and weight loss. Last LDL was of 120 and HDL of 50. She is on statin and denies any chest pain, claudication or myalgias.  ASSESSMENT AND PLAN:  Prediabetes  Other hyperlipidemia  Class 2 severe obesity with  serious comorbidity and body mass index (BMI) of 38.0 to 38.9 in adult, unspecified obesity type [facility])  PLAN:  Pre-Diabetes Emily Osborne will continue to work on weight loss, exercise, and decreasing simple carbohydrates in her diet to help decrease the risk of diabetes. We dicussed metformin including benefits and risks. She was informed that eating too many simple carbohydrates or too many calories at one sitting increases the likelihood of GI side effects. Chyrl agrees to continue taking metformin, and she agrees to follow up with our clinic in 2 weeks as directed to monitor her progress.  Hyperlipidemia Emily Osborne was informed of the American Heart Association Guidelines emphasizing intensive lifestyle modifications as the first line treatment for hyperlipidemia. We discussed many lifestyle modifications today in depth, and Emily Osborne will continue to work on decreasing saturated fats such as fatty red meat, butter and many fried foods. She will also increase vegetables and lean protein in her diet and continue to work on exercise and weight loss efforts. We will repeat labs in late July or early August. Emily Osborne agrees to follow up with our clinic in 2 weeks.  Obesity Emily Osborne is currently in the action stage of change. As such, her goal is to continue with weight loss efforts She has agreed to follow the Category 2 plan and follow the Pescatarian eating plan Emily Osborne is to plan to increase food amount at breakfast with more lean protein Emily Osborne has been instructed to work up to a goal of 150 minutes of combined cardio  and strengthening exercise per week for weight loss and overall health benefits. We discussed the following Behavioral Modification Strategies today: increasing lean protein intake, increasing vegetables and work on meal planning and easy cooking plans, keeping healthy foods in the home, and planning for success   Emily Osborne has agreed to follow up with our clinic in 2 weeks. She was informed of the  importance of frequent follow up visits to maximize her success with intensive lifestyle modifications for her multiple health conditions.  ALLERGIES: Allergies  Allergen Reactions  . Aspirin Other (See Comments)    REACTION: anaphylaxis  . Gentamicin Other (See Comments)    Eye drops turned the sclera bright red  . Ibuprofen Other (See Comments)    REACTION: anaphylaxsis  . Metronidazole Swelling    REACTION: red face/swelling  . Nsaids Other (See Comments)    REACTION: anaphylaxis  . Doxycycline Other (See Comments)    REACTION: severe nausea/vomiting  . Influenza A (H1n1) [place] Other (See Comments)    REACTION: sick for 3 weeks  . Loratadine Other (See Comments)    Fatigue/weakness  . Periactin [Cyproheptadine] Other (See Comments)    paranoid   . Ranitidine Hcl Other (See Comments)    REACTION: Lips turned red /peel  . Sulfamethoxazole-Trimethoprim Other (See Comments)    REACTION: face red/peel  . Tramadol Hcl Other (See Comments)    REACTION: paranoid    MEDICATIONS: Current Outpatient Medications on File Prior to Visit  Medication Sig Dispense Refill  . acetaminophen (TYLENOL) 500 MG tablet Take 1,000 mg by mouth every 6 (six) hours as needed for moderate pain.     Marland Kitchen albuterol (PROAIR HFA) 108 (90 Base) MCG/ACT inhaler USE 2 PUFFS EVERY 4 HOURS AS NEEDED FOR COUGH, WHEEZE OR SHORTNESS OF BREATH. 8.5 each 2  . ALPRAZolam (XANAX) 0.25 MG tablet Take 1 tablet (0.25 mg total) by mouth 3 (three) times daily as needed. 30 tablet 2  . atenolol (TENORMIN) 25 MG tablet TAKE 1/2 TABLETS (12.5 MG TOTAL) BY MOUTH DAILY 45 tablet 3  . azelastine (OPTIVAR) 0.05 % ophthalmic solution Place 1 drop into both eyes 2 (two) times daily as needed.    . Blood Glucose Monitoring Suppl (FREESTYLE FREEDOM) KIT 1 Device by Does not apply route daily. 1 each 0  . dexlansoprazole (DEXILANT) 60 MG capsule Take 1 capsule (60 mg total) by mouth daily. 90 capsule 0  . diclofenac sodium  (VOLTAREN) 1 % GEL Apply 2 g topically 4 (four) times daily. Rub into affected area of foot 2 to 4 times daily 100 g 2  . fexofenadine (ALLEGRA) 180 MG tablet Take 1 tablet (180 mg total) by mouth daily. 90 tablet 3  . fluticasone (FLONASE) 50 MCG/ACT nasal spray PLACE 2 SPRAYS INTO BOTH NOSTRILS DAILY. 16 g 4  . Fluticasone-Salmeterol (ADVAIR DISKUS) 250-50 MCG/DOSE AEPB Inhale 1 puff into the lungs 2 (two) times daily. 180 each 1  . FREESTYLE LITE test strip USE TO CHECK BLOOD SUGAR ONCE A DAY 100 each 0  . Iron Combinations (I.L.X. B-12) ELIX Take 1 each by mouth daily as needed (energy). 1 droplet full    . lidocaine (LIDODERM) 5 % Apply to affected area QD PRN for a Week 30 patch 0  . metFORMIN (GLUCOPHAGE) 500 MG tablet Take 1 tablet (500 mg total) by mouth daily with breakfast. 30 tablet 0  . rosuvastatin (CRESTOR) 10 MG tablet Take 1 tablet (10 mg total) by mouth daily. 90 tablet 3  .  valACYclovir (VALTREX) 1000 MG tablet Take 1,000 mg by mouth 3 (three) times daily as needed.    . Vitamin D, Ergocalciferol, (DRISDOL) 1.25 MG (50000 UT) CAPS capsule Take 1 capsule (50,000 Units total) by mouth every 7 (seven) days. 4 capsule 0   No current facility-administered medications on file prior to visit.     PAST MEDICAL HISTORY: Past Medical History:  Diagnosis Date  . Allergy   . Anxiety   . Asthma    pt has inhaler  . Back pain   . C. difficile colitis   . Cataract    bil eyes  . Chest pain   . Chronic fatigue syndrome   . Clostridium difficile colitis 12/13/2016  . Diverticulitis   . Dyspnea   . Dysrhythmia    palpitations  . Edema   . Elevated blood pressure reading without diagnosis of hypertension    "just elevated when I'm in pain" (12/07/2015)  . Fatty liver   . Fibromyalgia   . Ganglion of joint    right wrist  . GERD (gastroesophageal reflux disease)   . H. pylori infection   . Headache    hx of  . Helicobacter pylori (H. pylori) infection 12/13/2016  . Herpes  zoster without mention of complication   . HTN (hypertension)   . Hyperlipidemia   . Hyperthyroidism   . IBS (irritable bowel syndrome)   . Leg edema   . Multiple drug allergies 06/18/2017  . Myalgia   . Nontraumatic rupture of Achilles tendon   . Other acute reactions to stress   . Pain in joint, shoulder region   . Pain in joint, upper arm   . Pain in limb   . Palpitations   . Personal history of other diseases of digestive system   . Pneumonia    once  . PONV (postoperative nausea and vomiting)   . Routine screening for STI (sexually transmitted infection) 12/13/2016  . S/P partial colectomy 06/18/2017  . Thyroid disease    hyperthyroidism  . Type 2 diabetes mellitus with hyperglycemia, without long-term current use of insulin ([place]) 08/07/2018  . Vitamin B 12 deficiency     PAST SURGICAL HISTORY: Past Surgical History:  Procedure Laterality Date  . ABDOMINAL HYSTERECTOMY    . ACHILLES TENDON REPAIR Right 2005  . BUNIONECTOMY Bilateral    Bunionectomy 1983  . COLON SURGERY  01/23/2017   6 to 8 inches sigmoid colon removed  . COLONOSCOPY    . GANGLION CYST EXCISION Right    wrist  . LEFT HEART CATH AND CORONARY ANGIOGRAPHY N/A 10/03/2016   Procedure: Left Heart Cath and Coronary Angiography;  Surgeon: Burnell Blanks, MD;  Location: [facility];  Service: Cardiovascular;  Laterality: N/A;  . MENISCUS REPAIR Right 04/2014  . POLYPECTOMY    . TUBAL LIGATION    . wisdomteeth extraction      SOCIAL HISTORY: Social History   Tobacco Use  . Smoking status: Never Smoker  . Smokeless tobacco: Never Used  Substance Use Topics  . Alcohol use: No    Alcohol/week: 0.0 standard drinks  . Drug use: No    FAMILY HISTORY: Family History  Problem Relation Age of Onset  . Hypertension Father   . Diabetes Father   . Heart disease Father   . Kidney disease Father        Died, 1  . Hyperlipidemia Father   . Leukemia Brother   . Cancer Brother  myleoblastic anemia  . Hyperlipidemia Mother   . Hypertension Mother        Died, 58  . Thyroid disease Mother        Thyroid surgery  . Obesity Mother   . Heart disease Mother   . Pernicious anemia Sister   . Thyroid disease Sister        On thyroid Rx  . Lupus Daughter   . Coronary artery disease Brother   . Hypertension Brother   . Colon cancer Neg Hx   . Esophageal cancer Neg Hx   . Rectal cancer Neg Hx   . Stomach cancer Neg Hx   . Pancreatic cancer Neg Hx   . Prostate cancer Neg Hx   . Colon polyps Neg Hx     ROS: Review of Systems  Constitutional: Positive for weight loss.  Respiratory: Positive for shortness of breath.   Cardiovascular: Negative for chest pain and claudication.  Musculoskeletal: Negative for myalgias.  Endo/Heme/Allergies:       Negative hypoglycemia    PHYSICAL EXAM: Pt in no acute distress  RECENT LABS AND TESTS: BMET    Component Value Date/Time   NA 138 12/11/2018 0915   NA 142 03/20/2018 1046   K 4.2 12/11/2018 0915   CL 105 12/11/2018 0915   CO2 26 12/11/2018 0915   GLUCOSE 103 (H) 12/11/2018 0915   BUN 18 12/11/2018 0915   BUN 17 03/20/2018 1046   CREATININE 0.85 12/11/2018 0915   CREATININE 0.97 03/23/2016 1501   CALCIUM 9.0 12/11/2018 0915   GFRNONAA >60 11/27/2018 1144   GFRAA >60 11/27/2018 1144   Lab Results  Component Value Date   HGBA1C 6.0 12/11/2018   HGBA1C 5.8 08/07/2018   HGBA1C 6.0 05/13/2018   HGBA1C 5.6 03/20/2018   HGBA1C 5.8 (H) 12/12/2017   Lab Results  Component Value Date   INSULIN 20.0 03/20/2018   INSULIN 17.6 12/12/2017   CBC    Component Value Date/Time   WBC 5.2 11/27/2018 1144   RBC 4.70 11/27/2018 1144   HGB 13.5 11/27/2018 1144   HGB 13.5 12/12/2017 1102   HCT 41.6 11/27/2018 1144   HCT 41.8 12/12/2017 1102   PLT 253 11/27/2018 1144   MCV 88.5 11/27/2018 1144   MCV 89 12/12/2017 1102   MCH 28.7 11/27/2018 1144   MCHC 32.5 11/27/2018 1144   RDW 14.9 11/27/2018 1144   RDW 15.1  12/12/2017 1102   LYMPHSABS 2.3 08/07/2018 1208   LYMPHSABS 2.3 12/12/2017 1102   MONOABS 0.5 08/07/2018 1208   EOSABS 0.1 08/07/2018 1208   EOSABS 0.1 12/12/2017 1102   BASOSABS 0.0 08/07/2018 1208   BASOSABS 0.0 12/12/2017 1102   Iron/TIBC/Ferritin/ %Sat No results found for: IRON, TIBC, FERRITIN, IRONPCTSAT Lipid Panel     Component Value Date/Time   CHOL 203 (H) 12/11/2018 0915   CHOL 197 03/20/2018 1046   TRIG 162.0 (H) 12/11/2018 0915   HDL 50.30 12/11/2018 0915   HDL 57 03/20/2018 1046   CHOLHDL 4 12/11/2018 0915   VLDL 32.4 12/11/2018 0915   LDLCALC 120 (H) 12/11/2018 0915   LDLCALC 124 (H) 03/20/2018 1046   LDLDIRECT 139.4 12/26/2011 0905   Hepatic Function Panel     Component Value Date/Time   PROT 6.5 12/11/2018 0915   PROT 6.8 03/20/2018 1046   ALBUMIN 4.0 12/11/2018 0915   ALBUMIN 4.4 03/20/2018 1046   AST 24 12/11/2018 0915   ALT 32 12/11/2018 0915   ALKPHOS 59 12/11/2018 0915  BILITOT 0.5 12/11/2018 0915   BILITOT 0.4 03/20/2018 1046   BILIDIR 0.1 09/07/2014 1031      Component Value Date/Time   TSH 0.87 10/07/2018 0953   TSH 0.44 08/07/2018 1208   TSH 0.67 06/05/2018 1638      I, Trixie Dredge, am acting as transcriptionist for Ilene Qua, MD  I have reviewed the above documentation for accuracy and completeness, and I agree with the above. - Ilene Qua, MD

## 2019-01-01 ENCOUNTER — Other Ambulatory Visit: Payer: Self-pay

## 2019-01-01 ENCOUNTER — Telehealth: Payer: Self-pay | Admitting: Internal Medicine

## 2019-01-01 ENCOUNTER — Telehealth: Payer: Self-pay | Admitting: Hematology

## 2019-01-01 ENCOUNTER — Telehealth: Payer: Self-pay | Admitting: *Deleted

## 2019-01-01 ENCOUNTER — Ambulatory Visit (INDEPENDENT_AMBULATORY_CARE_PROVIDER_SITE_OTHER): Payer: 59 | Admitting: Internal Medicine

## 2019-01-01 ENCOUNTER — Encounter: Payer: Self-pay | Admitting: Internal Medicine

## 2019-01-01 ENCOUNTER — Encounter: Payer: Self-pay | Admitting: *Deleted

## 2019-01-01 DIAGNOSIS — Z20822 Contact with and (suspected) exposure to covid-19: Secondary | ICD-10-CM

## 2019-01-01 DIAGNOSIS — R05 Cough: Secondary | ICD-10-CM | POA: Insufficient documentation

## 2019-01-01 DIAGNOSIS — R058 Other specified cough: Secondary | ICD-10-CM | POA: Insufficient documentation

## 2019-01-01 DIAGNOSIS — J45991 Cough variant asthma: Secondary | ICD-10-CM

## 2019-01-01 MED ORDER — BUDESONIDE-FORMOTEROL FUMARATE 160-4.5 MCG/ACT IN AERO: 2.0000 | INHALATION_SPRAY | Freq: Two times a day (BID) | RESPIRATORY_TRACT | 0 refills | Status: DC

## 2019-01-01 MED ORDER — BENZONATATE 200 MG PO CAPS: 200.0000 mg | ORAL_CAPSULE | Freq: Three times a day (TID) | ORAL | 1 refills | Status: DC | PRN

## 2019-01-01 MED ORDER — FAMOTIDINE 20 MG PO TABS: ORAL_TABLET | ORAL | 11 refills | Status: DC

## 2019-01-01 MED FILL — BENZONATATE 200 MG CAP: 200 | 10 days supply | Qty: 30 | Fill #0

## 2019-01-01 NOTE — Patient Instructions (Signed)
Dexilant 60 mg    Take  30-60 min before first meal of the day and Pepcid (famotidine)  20 mg one after supper  until return to office - this is the best way to tell whether stomach acid is contributing to your problem.    GERD (REFLUX)  is an extremely common cause of respiratory symptoms just like yours , many times with no obvious heartburn at all.    It can be treated with medication, but also with lifestyle changes including elevation of the head of your bed (ideally with 6 -8inch blocks under the headboard of your bed),  Smoking cessation, avoidance of late meals, excessive alcohol, and avoid fatty foods, chocolate, peppermint, colas, red wine, and acidic juices such as orange juice.  NO MINT OR MENTHOL PRODUCTS SO NO COUGH DROPS  USE SUGARLESS CANDY INSTEAD (Jolley ranchers or Stover's or Life Savers) or even ice chips will also do - the key is to swallow to prevent all throat clearing. NO OIL BASED VITAMINS - use powdered substitutes.  Avoid fish oil when coughing.     Plan A = Automatic = stop advair and start symbicort 160 Take 2 puffs first thing in am and then another 2 puffs about 12 hours later.   Work on inhaler technique:  relax and gently blow all the way out then take a nice smooth deep breath back in, triggering the inhaler at same time you start breathing in.  Hold for up to 5 seconds if you can. Blow out thru nose. Rinse and gargle with water when done      Plan B = Backup Only use your albuterol inhaler as a rescue medication to be used if you can't catch your breath by resting or doing a relaxed purse lip breathing pattern.  - The less you use it, the better it will work when you need it. - Ok to use the inhaler up to 2 puffs  every 4 hours if you must but call for appointment if use goes up over your usual need - Don't leave home without it !!  (think of it like the spare tire for your car)   For cough try tessalon 200 mg every 6 hours as needed     No work until  return in 2 weeks and we do a  Covid Study prior to your visit here  If condition worsens in meantime go to ER

## 2019-01-01 NOTE — Telephone Encounter (Signed)
Called patient and scheduled appointment for COVID testing / Provider requested: Needs to have covid testing 2-3 days prior to next appointment 01/15/19 with Dr Melvyn Novas, please. She is negative but lives with her daughter who is positive. Needs to have covid testing 2-3 days prior to next appointment 01/15/19 with Dr Melvyn Novas, please. She is negative but lives with her daughter who is positive.  /

## 2019-01-01 NOTE — Telephone Encounter (Signed)
Needs covid testing 2-3 days prior to next appt here with Dr Melvyn Novas on 01/15/2019 Thanks

## 2019-01-01 NOTE — Telephone Encounter (Signed)
[facility] called to schedule pt for COVID testing; she is having sob, and + exposure; cancel encounter due to MD decided to cancel testing.

## 2019-01-01 NOTE — Progress Notes (Signed)
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 Pulmonary Consult    Referred by Dr Laury Axon- SOB since end of April. She has cough- non prod. She has had sweats but no fever.    Dyspnea: "only when I walk" but says  walked a mile 12/29/2018 Cough: dry daytime/ urge to clear throat is the predominant problem   saba doesn't really help   No obvious patterns in  day to day or daytime variability or assoc excess/ purulent sputum or mucus plugs or hemoptysis or  chest tightness,   or overt sinus or hb symptoms.     Also denies any obvious fluctuation of symptoms with weather or environmental changes or other aggravating or alleviating factors except as outlined above   No unusual exposure hx or h/o childhood pna/ asthma or knowledge of premature birth.  Current Allergies, Complete Past Medical History, Past Surgical History, Family History, and Social History were reviewed in [employer] record.  ROS  The following are not active complaints unless bolded Hoarseness, sore throat, dysphagia, dental problems, itching, sneezing,  nasal congestion or discharge of excess mucus or purulent secretions, ear ache,   fever, chills, sweats, unintended wt loss or wt gain, classically pleuritic or exertional cp,  orthopnea pnd or arm/hand swelling  or leg swelling,  presyncope, palpitations, abdominal pain, anorexia, nausea, vomiting, diarrhea  or change in bowel habits or change in bladder habits, change in stools or change in urine, dysuria, hematuria,  rash, arthralgias, visual complaints, headache, numbness, weakness or ataxia or problems with walking or coordination,  change in mood or  memory.              Past Medical History:  Diagnosis Date  . Allergy   . Anxiety   . Asthma    pt has inhaler  . Back pain   . C. difficile colitis   . Cataract    bil eyes  . Chest pain   . Chronic fatigue syndrome   . Clostridium difficile colitis 12/13/2016  . Diverticulitis   . Dyspnea   . Dysrhythmia    palpitations  . Edema   . Elevated blood pressure reading without diagnosis of hypertension    "just elevated when I'm in pain" (12/07/2015)  . Fatty liver   . Fibromyalgia   . Ganglion of joint    right wrist  . GERD (gastroesophageal reflux disease)   . H. pylori infection   . Headache    hx of  . Helicobacter pylori (H. pylori) infection 12/13/2016  . Herpes zoster without mention of complication   . HTN (hypertension)   . Hyperlipidemia   . Hyperthyroidism   . IBS (irritable bowel syndrome)   . Leg edema   . Multiple  drug allergies 06/18/2017  . Myalgia   . Nontraumatic rupture of Achilles tendon   . Other acute reactions to stress   . Pain in joint, shoulder region   . Pain in joint, upper arm   . Pain in limb   . Palpitations   . Personal history of other diseases of digestive system   . Pneumonia    once  . PONV (postoperative nausea and vomiting)   . Routine screening for STI (sexually transmitted infection) 12/13/2016  . S/P partial colectomy 06/18/2017  . Thyroid disease    hyperthyroidism  . Type 2 diabetes mellitus with hyperglycemia, without long-term current use of insulin ([facility]) 08/07/2018  . Vitamin B 12 deficiency     Outpatient Medications Prior to Visit  Medication Sig Dispense Refill  . acetaminophen (TYLENOL)  500 MG tablet Take 1,000 mg by mouth every 6 (six) hours as needed for moderate pain.     Marland Kitchen albuterol (PROAIR HFA) 108 (90 Base) MCG/ACT inhaler USE 2 PUFFS EVERY 4 HOURS AS NEEDED FOR COUGH, WHEEZE OR SHORTNESS OF BREATH. 8.5 each 2  . ALPRAZolam (XANAX) 0.25 MG tablet Take 1 tablet (0.25 mg total) by mouth 3 (three) times daily as needed. 30 tablet 2  . atenolol (TENORMIN) 25 MG tablet TAKE 1/2 TABLETS (12.5 MG TOTAL) BY MOUTH DAILY 45 tablet 3  . azelastine (OPTIVAR) 0.05 % ophthalmic solution Place 1 drop into both eyes 2 (two) times daily as needed.    . Blood Glucose Monitoring Suppl (FREESTYLE FREEDOM) KIT 1 Device by Does not apply route daily. 1 each 0  . dexlansoprazole (DEXILANT) 60 MG capsule Take 1 capsule (60 mg total) by mouth daily. 90 capsule 0  . diclofenac sodium (VOLTAREN) 1 % GEL Apply 2 g topically 4 (four) times daily. Rub into affected area of foot 2 to 4 times daily 100 g 2  . fexofenadine (ALLEGRA) 180 MG tablet Take 1 tablet (180 mg total) by mouth daily. 90 tablet 3  . fluticasone (FLONASE) 50 MCG/ACT nasal spray PLACE 2 SPRAYS INTO BOTH NOSTRILS DAILY. 16 g 4  . Fluticasone-Salmeterol (ADVAIR DISKUS) 250-50 MCG/DOSE AEPB Inhale 1 puff into the lungs 2 (two) times daily. 180 each 1  . FREESTYLE LITE test strip USE TO CHECK BLOOD SUGAR ONCE A DAY 100 each 0  . Iron Combinations (I.L.X. B-12) ELIX Take 1 each by mouth daily as needed (energy). 1 droplet full    . lidocaine (LIDODERM) 5 % Apply to affected area QD PRN for a Week 30 patch 0  . metFORMIN (GLUCOPHAGE) 500 MG tablet Take 1 tablet (500 mg total) by mouth daily with breakfast. 30 tablet 0  . rosuvastatin (CRESTOR) 10 MG tablet Take 1 tablet (10 mg total) by mouth daily. 90 tablet 3  . valACYclovir (VALTREX) 1000 MG tablet Take 1,000 mg by mouth 3 (three) times daily as needed.    . Vitamin D, Ergocalciferol, (DRISDOL) 1.25 MG (50000 UT) CAPS capsule Take 1 capsule (50,000 Units total) by mouth every 7 (seven)  days. 4 capsule 0      Objective:     BP 112/74 (BP Location: Left Arm, Cuff Size: Normal)   Pulse 77   Temp 98 F (36.7 C) (Oral)   Ht 5\' 5"  (1.651 m)   Wt 235 lb (106.6 kg)   SpO2 100%   BMI 39.11 kg/m     Obese bf nad   HEENT: nl dentition, turbinates bilaterally, and oropharynx. Nl external ear canals without cough  reflex   NECK :  without JVD/Nodes/TM/ nl carotid upstrokes bilaterally   LUNGS: no acc muscle use,  Nl contour chest which is clear to A and P bilaterally without cough on insp or exp maneuvers   CV:  RRR  no s3 or murmur or increase in P2, and no edema   ABD:  obese soft and nontender with nl inspiratory excursion in the supine position. No bruits or organomegaly appreciated, bowel sounds nl  MS:  Nl gait/ ext warm without deformities, calf tenderness, cyanosis or clubbing No obvious joint restrictions   SKIN: warm and dry without lesions    NEURO:  alert, approp, nl sensorium with  no motor or cerebellar deficits apparent.       I personally reviewed images and agree with radiology impression as follows:   Chest CTa 11/27/2018 Negative examination for pulmonary embolism. Lungs are clear. No pleural effusion or pneumothorax.      Assessment   No problem-specific Assessment & Plan notes found for this encounter.     Sandrea Hughs, MD 01/01/2019

## 2019-01-02 NOTE — Assessment & Plan Note (Addendum)
Flared early April 2020 while on Advair 250 - d/c 01/01/2019  - 01/01/2019  After extensive coaching inhaler device,  effectiveness =    75% from a baseline of 50%   DDX of  difficult airways management almost all start with A and  include Adherence, Ace Inhibitors, Acid Reflux, Active Sinus Disease, Alpha 1 Antitripsin deficiency, Anxiety masquerading as Airways dz,  ABPA,  Allergy(esp in young), Aspiration (esp in elderly), Adverse effects of meds,  Active smoking or vaping, A bunch of PE's (a small clot burden can't cause this syndrome unless there is already severe underlying pulm or vascular dz with poor reserve) plus two Bs  = Bronchiectasis and Beta blocker use..and one C= CHF   Adherence is always the initial "prime suspect" and is a multilayered concern that requires a "trust but verify" approach in every patient - starting with knowing how to use medications, especially inhalers, correctly, keeping up with refills and understanding the fundamental difference between maintenance and prns vs those medications only taken for a very short course and then stopped and not refilled.  - see hfa teaching  - return with all meds in hand using a trust but verify approach to confirm accurate Medication  Reconciliation The principal here is that until we are certain that the  patients are doing what we've asked, it makes no sense to ask them to do more.    ? Acid (or non-acid) GERD > always difficult to exclude as up to 75% of pts in some series report no assoc GI/ Heartburn symptoms> rec continue max (24h)  acid suppression and diet restrictions/ reviewed -  Of the three most common causes of  Sub-acute / recurrent or chronic cough, only one (GERD)  can actually contribute to/ trigger  the other two (asthma and post nasal drip syndrome)  and perpetuate the cylce of cough.  While not intuitively obvious, many patients with chronic low grade reflux do not cough until there is a primary insult that disturbs  the protective epithelial barrier and exposes sensitive nerve endings.   This is typically viral but can due to PNDS and  either may apply here.   The point is that once this occurs, it is difficult to eliminate the cycle  using anything but a maximally effective acid suppression regimen at least in the short run, accompanied by an appropriate diet to address non acid GERD and control / eliminate the cough itself for at least 3 days with tessalon 200 tid and consider add gabapentin next as intol of tramadol    ? Adverse effects of dpi > d/c advair  ? Allergy/asthma > continue high dose ics for now though low threshold to change to lower strength when return esp if can do FENO/spirometry  to confirm on return p screen for covid prior to arrival  ? Anxiety/panic disorder > usually at the bottom of this list of usual suspects but should be much higher on this pt's based on H and P and note already on psychotropics and may interfere with adherence and also interpretation of response or lack thereof to symptom management which can be quite subjective.  - very poor insight into meds/covid/symptoms "my breathing problems has resolved as long I'm not doing anything" ? Is this anxiety related non-logical thinking - note similar problems with non-dx chronic cp  ? A bunch of PE's > arleady ruled out   ? Cardiac asthma/IHD > already ruled out and echo 12/30/18 reassuring as well with only mild  diastolic dysfunction and LAE   Would like to pursue this today but she is high risk to be carrying the virus and should be in quarantine still (again very poor insight here) so will ask her to return in 14 days and have covid study w/in 48 h ov of so we can spend more time sorting through her problems -  in meantime if gets worse advised to go directly to ER   See device teaching which extended face to face time for this visit   Each maintenance medication was reviewed in detail including most importantly the difference  between maintenance and as needed and under what circumstances the prns are to be used.  Please see AVS for specific  Instructions which are unique to this visit and I personally typed out  which were reviewed in detail in writing with the patient and a copy provided.

## 2019-01-05 ENCOUNTER — Telehealth: Payer: Self-pay | Admitting: Internal Medicine

## 2019-01-05 NOTE — Telephone Encounter (Signed)
It needs to be done around 14 days after her last contact with a Covid Pos pt with active symptoms which she told me was 12/28/2018 (her daughter was symptomatic and last day of sickness was 12/28/2018)

## 2019-01-05 NOTE — Telephone Encounter (Signed)
Called the patient back and she stated that she was tested by [facility] at Work and test for [place] was negative.   She will need a letter from Dr. Melvyn Novas stating that she can return to work. Message forwarded to Dr. Melvyn Novas to review.  Dr. Melvyn Novas: Based on this patient's last office visit on 01/01/19 and the telephone encounter under "notes" dated 01/01/19 from hematology. Can you approve a return to work letter for the patient? The note states the result was negative. She is a Furniture conservator/restorer. Thank you.

## 2019-01-05 NOTE — Telephone Encounter (Signed)
Unable to leave vmail on cell as the mail box is full and cannot accept message. Will try again later to advise her of Dr. Gustavus Bryant response.

## 2019-01-06 NOTE — Telephone Encounter (Signed)
Pt has been made aware of recommendations. Nothing further needed. She states she was informed covid-neg by health at work. Made aware not visible yet in epic.

## 2019-01-07 ENCOUNTER — Telehealth: Payer: Self-pay | Admitting: *Deleted

## 2019-01-07 ENCOUNTER — Telehealth: Payer: Self-pay | Admitting: Internal Medicine

## 2019-01-07 NOTE — Telephone Encounter (Signed)
Pt was seen on 01/01/2019 by MW and advised to have COVID lab test 2 days prior to coming back in for OV with MW. Pt accidentally already had lab test done on 01/02/2019. Per MW, pt will need have test performed again 2 days prior to coming in for OV otherwise pt cannot be seen in office.   LMTCB for pt.

## 2019-01-07 NOTE — Telephone Encounter (Signed)
We received paperwork for FMLA paperwork and need to know what the reason she is needing it.  No awswer/ no vm

## 2019-01-09 ENCOUNTER — Encounter (INDEPENDENT_AMBULATORY_CARE_PROVIDER_SITE_OTHER): Payer: Self-pay | Admitting: Family Medicine

## 2019-01-09 ENCOUNTER — Other Ambulatory Visit: Payer: 59

## 2019-01-09 ENCOUNTER — Telehealth: Payer: Self-pay | Admitting: *Deleted

## 2019-01-09 DIAGNOSIS — Z20822 Contact with and (suspected) exposure to covid-19: Secondary | ICD-10-CM

## 2019-01-09 NOTE — Telephone Encounter (Signed)
Routing message to the [facility] pool for testing.  Patient has visit with Dr. Melvyn Novas scheduled for 01/15/2019 at 8:45 and he would like to have COVID results prior to that appointment please.  Thank you for your help PEC!

## 2019-01-09 NOTE — Telephone Encounter (Signed)
Yes agreed to the plan exactly the way I rec  covid w/in 48 h of eval/tests planned

## 2019-01-09 NOTE — Telephone Encounter (Signed)
Pt is calling back [phone]

## 2019-01-09 NOTE — Telephone Encounter (Signed)
Call returned to patient, she states she does not understand why she needs to repeat the test. She said she has had the test done 3 times and it has been negative. She also wants to know when she can go back to work because she will have to let her job know. She states health at work tested her last Friday and it was negative. She states if she has to repeat it she will but she does not understand how she can have 3 negative results and still need to keep re-testing. Made aware I would let MW know.  She also states the results take longer than 2 days.    MW please advise if patient needs to re-peat COVID-19 test per patient request to confirm that you aware she has had 3 negative tests.

## 2019-01-09 NOTE — Telephone Encounter (Signed)
Discussed in detail all the  indications, usual  risks and alternatives  relative to the benefits with patient who agrees to proceed with w/u as outlined in my original ov

## 2019-01-09 NOTE — Telephone Encounter (Signed)
Patient scheduled for COVID testing at the [facility] testing site on 01/13/19 at 9 am.

## 2019-01-09 NOTE — Telephone Encounter (Signed)
LMTCB

## 2019-01-09 NOTE — Telephone Encounter (Signed)
Please clarify that you spoke with the pt and that she agrees to be tested again before we order another test, thanks

## 2019-01-09 NOTE — Telephone Encounter (Signed)
Patient contacted and scheduled for COVID-19 testing per request of Dr. Melvyn Novas. Pt scheduled at the [facility] testing site on 01/13/19 at 9 am Pt advised to remain in car and to wear a mask on the day of appt. Understanding verbalized.

## 2019-01-13 ENCOUNTER — Other Ambulatory Visit: Payer: 59

## 2019-01-13 ENCOUNTER — Ambulatory Visit (INDEPENDENT_AMBULATORY_CARE_PROVIDER_SITE_OTHER): Payer: 59 | Admitting: Family Medicine

## 2019-01-13 DIAGNOSIS — R6889 Other general symptoms and signs: Secondary | ICD-10-CM | POA: Diagnosis not present

## 2019-01-13 DIAGNOSIS — Z20822 Contact with and (suspected) exposure to covid-19: Secondary | ICD-10-CM

## 2019-01-14 ENCOUNTER — Encounter: Payer: Self-pay | Admitting: *Deleted

## 2019-01-14 LAB — NOVEL CORONAVIRUS, NAA: SARS-CoV-2, NAA: NOT DETECTED

## 2019-01-14 NOTE — Telephone Encounter (Signed)
No answer/vm full

## 2019-01-14 NOTE — Telephone Encounter (Signed)
mychart message sent as well.

## 2019-01-15 ENCOUNTER — Encounter: Payer: Self-pay | Admitting: *Deleted

## 2019-01-15 ENCOUNTER — Ambulatory Visit: Payer: 59 | Admitting: Internal Medicine

## 2019-01-15 ENCOUNTER — Encounter: Payer: Self-pay | Admitting: Internal Medicine

## 2019-01-15 ENCOUNTER — Other Ambulatory Visit: Payer: Self-pay

## 2019-01-15 VITALS — BP 118/74 | HR 83 | Temp 98.2°F | Ht 65.0 in | Wt 237.0 lb

## 2019-01-15 DIAGNOSIS — R05 Cough: Secondary | ICD-10-CM

## 2019-01-15 DIAGNOSIS — R058 Other specified cough: Secondary | ICD-10-CM

## 2019-01-15 MED ORDER — BUDESONIDE-FORMOTEROL FUMARATE 80-4.5 MCG/ACT IN AERO: INHALATION_SPRAY | RESPIRATORY_TRACT | 11 refills | Status: DC

## 2019-01-15 NOTE — Progress Notes (Signed)
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 Pulmonary Consult    Referred by Dr Laury Axon- SOB since end of April. She has cough- non prod. She has had sweats but no fever.   Dyspnea: "only when I walk" but says  walked a mile 12/29/2018 Cough: dry daytime/ urge to clear throat is the predominant problem   saba doesn't really help rec Dexilant 60 mg    Take  30-60 min before first meal of the day and Pepcid (famotidine)  20 mg one after supper  until return to office - this is the best way to tell whether stomach acid is contributing to your problem.   GERD diet  Plan A = Automatic = stop advair and start symbicort 160 Take 2 puffs first thing in am and then another 2 puffs about 12 hours later.  Work on inhaler technique:  Plan B = Backup Only use your albuterol inhaler as a rescue medication For cough try tessalon 200 mg every 6 hours as needed  No work until return in 2 weeks and we do a  Covid Study prior to your visit  >>>> neg  01/13/2019    01/15/2019  f/u ov/Rylei Codispoti re: asthma symb 160 only using one puff  bid  Chief Complaint  Patient presents with  . Follow-up    Breathing is much improved. No co's. She has only used her albuterol once since the last visit.    Dyspnea:  Walked x 2 miles x 2 d prior to OV  Stopped due to fatigue,  not sob  Cough: none Sleeping: no resp c/o's / on side/bed flat, two pillows  SABA use: x one since last ov 02: none    No obvious day to day or daytime variability or assoc excess/ purulent sputum or mucus plugs or hemoptysis or cp or chest tightness, subjective wheeze or overt sinus or hb symptoms.   Sleeping as above without nocturnal  or early am exacerbation  of respiratory  c/o's or need for noct saba. Also denies any obvious fluctuation of symptoms with weather or environmental changes or other aggravating or alleviating factors except as outlined above   No unusual exposure hx or h/o childhood pna or  knowledge of premature birth.  Current Allergies, Complete Past Medical History, Past Surgical History, Family History, and Social History were reviewed in [employer] record.  ROS  The following are not active complaints unless bolded Hoarseness, sore throat, dysphagia, dental problems, itching, sneezing,  nasal congestion or discharge of excess mucus or purulent secretions, ear ache,   fever, chills, sweats, unintended wt loss or wt gain, classically pleuritic or exertional cp,  orthopnea pnd or arm/hand swelling  or leg swelling, presyncope,  palpitations, abdominal pain, anorexia, nausea, vomiting, diarrhea  or change in bowel habits or change in bladder habits, change in stools or change in urine, dysuria, hematuria,  rash, arthralgias, visual complaints, headache, numbness, weakness or ataxia or problems with walking or coordination,  change in mood or  memory.        Current Meds  Medication Sig  . acetaminophen (TYLENOL) 500 MG tablet Take 1,000 mg by mouth every 6 (six) hours as needed for moderate pain.   Marland Kitchen albuterol (PROAIR HFA) 108 (90 Base) MCG/ACT inhaler USE 2 PUFFS EVERY 4 HOURS AS NEEDED FOR COUGH, WHEEZE OR SHORTNESS OF BREATH.  Marland Kitchen ALPRAZolam (XANAX) 0.25 MG tablet Take 1 tablet (0.25 mg total) by mouth 3 (three) times daily as needed.  Marland Kitchen atenolol  (TENORMIN) 25 MG tablet TAKE 1/2 TABLETS (12.5 MG TOTAL) BY MOUTH DAILY  . azelastine (OPTIVAR) 0.05 % ophthalmic solution Place 1 drop into both eyes 2 (two) times daily as needed.  . benzonatate (TESSALON) 200 MG capsule Take 1 capsule (200 mg total) by mouth 3 (three) times daily as needed for cough.  . Blood Glucose Monitoring Suppl (FREESTYLE FREEDOM) KIT 1 Device by Does not apply route daily.  . budesonide-formoterol (SYMBICORT) 160-4.5 MCG/ACT inhaler Inhale 2 puffs into the lungs 2 (two) times daily.  Marland Kitchen dexlansoprazole (DEXILANT) 60 MG capsule Take 1 capsule (60 mg total) by mouth daily.  . diclofenac sodium (VOLTAREN) 1 % GEL Apply 2 g topically 4 (four) times daily. Rub into affected area of foot 2 to 4 times daily  . famotidine (PEPCID) 20 MG tablet One after supper  . fexofenadine (ALLEGRA) 180 MG tablet Take 1 tablet (180 mg total) by mouth daily.  . fluticasone (FLONASE) 50 MCG/ACT nasal spray PLACE 2 SPRAYS INTO BOTH NOSTRILS DAILY.  Marland Kitchen FREESTYLE LITE test strip USE TO CHECK BLOOD SUGAR ONCE A DAY  . Iron Combinations (I.L.X. B-12) ELIX Take 1 each by mouth daily as needed (energy). 1 droplet full  . lidocaine (LIDODERM) 5 % Apply to affected area QD PRN for a Week  . metFORMIN (GLUCOPHAGE) 500 MG tablet Take 1 tablet (500 mg total) by mouth daily with breakfast.  . rosuvastatin (CRESTOR) 10 MG tablet Take 1 tablet (10 mg total) by mouth daily.  . valACYclovir (VALTREX) 1000 MG tablet Take 1,000 mg by mouth 3 (three) times daily as needed.  . Vitamin D, Ergocalciferol, (DRISDOL) 1.25 MG (50000 UT) CAPS capsule Take 1 capsule (50,000 Units total) by mouth every 7 (seven) days.                    Past Medical History:  Diagnosis Date  . Allergy   . Anxiety   . Asthma    pt has inhaler  . Back pain   . C. difficile colitis   . Cataract    bil eyes  . Chest pain   . Chronic fatigue syndrome   . Clostridium difficile colitis 12/13/2016  . Diverticulitis   . Dyspnea    . Dysrhythmia    palpitations  . Edema   . Elevated blood pressure reading without diagnosis of hypertension    "just elevated when I'm in pain" (12/07/2015)  . Fatty liver   . Fibromyalgia   . Ganglion of joint    right wrist  . GERD (gastroesophageal reflux disease)   . H. pylori infection   . Headache    hx of  . Helicobacter pylori (H. pylori) infection 12/13/2016  .  Herpes zoster without mention of complication   . HTN (hypertension)   . Hyperlipidemia   . Hyperthyroidism   . IBS (irritable bowel syndrome)   . Leg edema   . Multiple drug allergies 06/18/2017  . Myalgia   . Nontraumatic rupture of Achilles tendon   . Other acute reactions to stress   . Pain in joint, shoulder region   . Pain in joint, upper arm   . Pain in limb   . Palpitations   . Personal history of other diseases of digestive system   . Pneumonia    once  . PONV (postoperative nausea and vomiting)   . Routine screening for STI (sexually transmitted infection) 12/13/2016  . S/P partial colectomy 06/18/2017  . Thyroid disease    hyperthyroidism  . Type 2 diabetes mellitus with hyperglycemia, without long-term current use of insulin (HCC) 08/07/2018  . Vitamin B 12 deficiency          Objective:     amb pleasant bf nad  Wt Readings from Last 3 Encounters:  01/15/19 237 lb (107.5 kg)  01/01/19 235 lb (106.6 kg)  12/23/18 233 lb 9.6 oz (106 kg)     Vital signs reviewed - Note on arrival 02 sats  97% on RA   HEENT: nl dentition, turbinates bilaterally, and oropharynx. Nl external ear canals without cough reflex   NECK :  without JVD/Nodes/TM/ nl carotid upstrokes bilaterally   LUNGS: no acc muscle use,  Nl contour chest which is clear to A and P bilaterally without cough on insp or exp maneuvers   CV:  RRR  no s3 or murmur or increase in P2, and no edema   ABD:  Obese/  nontender with nl inspiratory excursion in the supine position. No bruits or organomegaly appreciated, bowel sounds  nl  MS:  Nl gait/ ext warm without deformities, calf tenderness, cyanosis or clubbing No obvious joint restrictions   SKIN: warm and dry without lesions    NEURO:  alert, approp, nl sensorium with  no motor or cerebellar deficits apparent.             Assessment

## 2019-01-15 NOTE — Patient Instructions (Addendum)
Change symbicort 80 Take 2 puffs first thing in am and then another 2 puffs about 12 hours later.   Work on inhaler technique:  relax and gently blow all the way out then take a nice smooth deep breath back in, triggering the inhaler at same time you start breathing in.  Hold for up to 5 seconds if you can. Blow out thru nose. Rinse and gargle with water when done     Only use your albuterol as a rescue medication to be used if you can't catch your breath by resting or doing a relaxed purse lip breathing pattern.  - The less you use it, the better it will work when you need it. - Ok to use up to 2 puffs  every 4 hours if you must but call for immediate appointment if use goes up over your usual need - Don't leave home without it !!  (think of it like the spare tire for your car)   Please schedule a follow up office visit in 6 weeks, call sooner if needed with pfts on return

## 2019-01-16 ENCOUNTER — Encounter: Payer: Self-pay | Admitting: Internal Medicine

## 2019-01-16 NOTE — Assessment & Plan Note (Addendum)
Flared early April 2020 while on Advair 250 - d/c 01/01/2019  - 01/15/2019  After extensive coaching inhaler device,  effectiveness =    50% and doing better on symb 160 1 bid so try symb 80 2bid   All goals of chronic asthma control met including optimal function and elimination of symptoms with minimal need for rescue therapy.  Contingencies discussed in full including contacting this office immediately if not controlling the symptoms using the rule of two's.     Since doing well on the symb 160 try 80 2bid and then consider change to prn dosing when returns Based on two studies from [place]; 20 p 1865 (2018) and 380 : p2020-30 (2019) in pts with mild asthma it is reasonable to use low dose symbicort eg 80 2bid "prn" flare in this setting but I emphasized this was only shown with symbicort and takes advantage of the rapid onset of action but is not the same as "rescue therapy" but can be stopped once the acute symptoms have resolved and the need for rescue has been minimized (< 2 x weekly)    >>> f/u 6 weeks with pfts  >>> return to work s restrictions but rec observe strict Covid-19 guidelines at work and out side of work    I had an extended discussion with the patient reviewing all relevant studies completed to date and  lasting 15 to 20 minutes of a 25 minute visit    See device teaching which extended face to face time for this visit.  Each maintenance medication was reviewed in detail including emphasizing most importantly the difference between maintenance and prns and under what circumstances the prns are to be triggered using an action plan format that is not reflected in the computer generated alphabetically organized AVS which I have not found useful in most complex patients, especially with respiratory illnesses  Please see AVS for specific instructions unique to this visit that I personally wrote and verbalized to the the pt in detail and then reviewed with pt  by my nurse  highlighting any  changes in therapy recommended at today's visit to their plan of care.

## 2019-01-19 ENCOUNTER — Encounter: Payer: Self-pay | Admitting: Podiatry

## 2019-01-19 ENCOUNTER — Other Ambulatory Visit: Payer: Self-pay

## 2019-01-19 ENCOUNTER — Ambulatory Visit: Payer: 59 | Admitting: Orthotics

## 2019-01-19 ENCOUNTER — Ambulatory Visit: Payer: 59 | Admitting: Podiatry

## 2019-01-19 VITALS — Temp 97.5°F

## 2019-01-19 DIAGNOSIS — M7672 Peroneal tendinitis, left leg: Secondary | ICD-10-CM

## 2019-01-19 DIAGNOSIS — M779 Enthesopathy, unspecified: Secondary | ICD-10-CM

## 2019-01-19 DIAGNOSIS — R7303 Prediabetes: Secondary | ICD-10-CM

## 2019-01-19 NOTE — Progress Notes (Signed)
Subjective: 58 year old female presents the office today for follow-up evaluation of left foot pain.  Overall she states that she is doing much better.  She has some occasional discomfort if not, she has been on her foot but overall she states that she has made good improvement and she tolerated the injection well without any problems.  She presents the office to pick up orthotics. Denies any systemic complaints such as fevers, chills, nausea, vomiting. No acute changes since last appointment, and no other complaints at this time.   Objective: AAO x3, NAD DP/PT pulses palpable bilaterally, CRT less than 3 seconds Today there is very minimal tenderness to palpation along the fifth metatarsal base on the left foot.  There is no pain on the course of the peroneal tendon.  Peroneal tendons are intact.  Achilles tendon intact.  There is no other areas of pinpoint tenderness.  There is no edema, erythema.   No pain with calf compression, swelling, warmth, erythema  Assessment: 58 year old female with insertional peroneal tendinitis, foot metatarsal base pain- much improved  Plan: -All treatment options discussed with the patient including all alternatives, risks, complications.  -Overall she is doing better.  I do want her to continue with general stretching exercises, rehab.  Ice the area as well.  Continue with supportive shoes.  She saw Liliane Channel today and dispensed orthotics. -RTC 4 weeks for orthotic, foot check or sooner if needed.  Trula Slade DPM

## 2019-01-19 NOTE — Progress Notes (Signed)
Patient came in today to pick up custom made foot orthotics.  The goals were accomplished and the patient reported no dissatisfaction with said orthotics.  Patient was advised of breakin period and how to report any issues. 

## 2019-01-20 ENCOUNTER — Telehealth: Payer: Self-pay | Admitting: *Deleted

## 2019-01-20 MED FILL — metFORMIN HCL 500 MG TABS: 500 | 30 days supply | Qty: 30 | Fill #0

## 2019-01-20 MED FILL — ATENOLOL 25 MG TABLET: 25 | 30 days supply | Qty: 45 | Fill #1

## 2019-01-20 MED FILL — VIT D2 1.25 MG (50,000 UNIT: 1.25 MG | 28 days supply | Qty: 4 | Fill #0

## 2019-01-20 MED FILL — SYMBICORT 80-4.5 MCG INH: 80-4.5 | 30 days supply | Qty: 10 | Fill #0

## 2019-01-20 NOTE — Telephone Encounter (Signed)
Left message on machine again about [employer] paperwork.  Need to see what the fmla forms are about.

## 2019-01-22 NOTE — Telephone Encounter (Addendum)
°  Relation to pt: self  Call back number: [phone]   Reason for call:  Patient returned call patient and states she has been out of work since November 03, 2018 (last day) [employer] wanted PCP to fill out paperwork to reflect why. Patient daughter had flu like symtopms and one daughter eventually was dx with COVID therefore the hospital didn't allow her to work. Patient has taken 4 test and all COVID test are negative  Patient will return back to work 01/23/2019

## 2019-01-26 ENCOUNTER — Telehealth: Payer: Self-pay | Admitting: Adult Health

## 2019-01-27 NOTE — Telephone Encounter (Signed)
It appears a Dr. Melvyn Novas wrote a letter for the patient.

## 2019-01-28 ENCOUNTER — Telehealth: Payer: Self-pay | Admitting: *Deleted

## 2019-01-28 NOTE — Telephone Encounter (Signed)
Forms have been signed. Will give back to [facility].

## 2019-01-28 NOTE — Telephone Encounter (Signed)
Judson Roch would you be willing to fill these forms out as MW is not here?

## 2019-01-28 NOTE — Telephone Encounter (Signed)
Pt states she received an orthotic about 1 week ago and it is not right, and the man said to call back. I transferred pt to [place].

## 2019-01-28 NOTE — Telephone Encounter (Signed)
I have never seen this patient. This should really be done by [facility] when he returns, or someone who has seen the patient. Thanks

## 2019-01-28 NOTE — Telephone Encounter (Signed)
Dr. Melvyn Novas is the only provider she has seen. I will route to him or see if TPwill fill out the forms.

## 2019-01-29 ENCOUNTER — Other Ambulatory Visit: Payer: Self-pay

## 2019-01-29 ENCOUNTER — Ambulatory Visit: Payer: 59 | Admitting: Orthotics

## 2019-01-29 DIAGNOSIS — M7672 Peroneal tendinitis, left leg: Secondary | ICD-10-CM

## 2019-01-29 DIAGNOSIS — M779 Enthesopathy, unspecified: Secondary | ICD-10-CM

## 2019-01-29 NOTE — Progress Notes (Signed)
Added valgus RF post to keep foot sliding laterally.

## 2019-01-29 NOTE — Telephone Encounter (Signed)
Rec'd forms back from [facility]. Fwd to [employer] via interoffice mail - 01/29/2019-pr

## 2019-02-12 ENCOUNTER — Encounter: Payer: 59 | Admitting: Family Medicine

## 2019-02-12 ENCOUNTER — Other Ambulatory Visit: Payer: Self-pay

## 2019-02-12 ENCOUNTER — Encounter: Payer: Self-pay | Admitting: Family Medicine

## 2019-02-13 MED FILL — metFORMIN HCL 500 MG TABS: 500 | 30 days supply | Qty: 30 | Fill #0

## 2019-02-13 MED FILL — VIT D2 1.25 MG (50,000 UNIT: 1.25 MG | 28 days supply | Qty: 4 | Fill #0

## 2019-02-13 MED FILL — FEXOFENADINE HCL 180 MG TAB: 180 | 30 days supply | Qty: 30 | Fill #1

## 2019-02-16 ENCOUNTER — Ambulatory Visit: Payer: 59 | Admitting: Podiatry

## 2019-02-17 NOTE — Telephone Encounter (Signed)
Open n error °

## 2019-02-26 ENCOUNTER — Ambulatory Visit: Payer: 59 | Admitting: Internal Medicine

## 2019-03-25 ENCOUNTER — Ambulatory Visit: Payer: 59 | Admitting: Cardiology

## 2019-03-26 ENCOUNTER — Other Ambulatory Visit: Payer: Self-pay

## 2019-03-26 ENCOUNTER — Encounter: Payer: Self-pay | Admitting: Family Medicine

## 2019-03-26 ENCOUNTER — Ambulatory Visit (INDEPENDENT_AMBULATORY_CARE_PROVIDER_SITE_OTHER): Payer: 59 | Admitting: Family Medicine

## 2019-03-26 DIAGNOSIS — J302 Other seasonal allergic rhinitis: Secondary | ICD-10-CM | POA: Diagnosis not present

## 2019-03-26 DIAGNOSIS — R109 Unspecified abdominal pain: Secondary | ICD-10-CM | POA: Diagnosis not present

## 2019-03-26 MED ORDER — FEXOFENADINE HCL 180 MG PO TABS: 180.0000 mg | ORAL_TABLET | Freq: Every day | ORAL | 3 refills | Status: DC

## 2019-03-26 MED ORDER — CIPROFLOXACIN HCL 250 MG PO TABS: 250.0000 mg | ORAL_TABLET | Freq: Two times a day (BID) | ORAL | 0 refills | Status: DC

## 2019-03-26 MED FILL — CIPROFLOXACIN HCL 250 MG TA: 250 | 3 days supply | Qty: 6 | Fill #0

## 2019-03-26 MED FILL — FEXOFENADINE HCL 180 MG TAB: 180 | 60 days supply | Qty: 60 | Fill #0

## 2019-03-26 NOTE — Progress Notes (Signed)
.  Virtual Visit via Video Note  I connected with Rudene Christians on 03/26/19 at  3:40 PM EDT by a video enabled telemedicine application and verified that I am speaking with the correct person using two identifiers.  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 The patient expressed understanding and agreed to proceed.  History of Present Illness: Pt is home c/o flank pain and thinks she has a uti-- she just does not feel well She felt just like this when she has a uti last    Observations/Objective: No vitals obtained  No fever  Pt is in NAD  Assessment and Plan: 1. Seasonal allergies Stable Refill allegra  - fexofenadine (ALLEGRA) 180 MG tablet; Take 1 tablet (180 mg total) by mouth daily.  Dispense: 90 tablet; Refill: 3  2. Flank pain Pt will come in tomorrow for UA  And culture  - POCT Urinalysis Dipstick (Automated); Future - Urine Culture; Future - ciprofloxacin (CIPRO) 250 MG tablet; Take 1 tablet (250 mg total) by mouth 2 (two) times daily.  Dispense: 6 tablet; Refill: 0   Follow Up Instructions:    I discussed the assessment and treatment plan with the patient. The patient was provided an opportunity to ask questions and all were answered. The patient agreed with the plan and demonstrated an understanding of the instructions.   The patient was advised to call back or seek an in-person evaluation if the symptoms worsen or if the condition fails to improve as anticipated.  I provided 15 minutes of non-face-to-face time during this encounter.   [name], DO

## 2019-03-27 ENCOUNTER — Other Ambulatory Visit (INDEPENDENT_AMBULATORY_CARE_PROVIDER_SITE_OTHER): Payer: 59

## 2019-03-27 ENCOUNTER — Other Ambulatory Visit: Payer: Self-pay

## 2019-03-27 DIAGNOSIS — R109 Unspecified abdominal pain: Secondary | ICD-10-CM | POA: Diagnosis not present

## 2019-03-27 LAB — POC URINALSYSI DIPSTICK (AUTOMATED)
Bilirubin, UA: NEGATIVE
Blood, UA: NEGATIVE
Glucose, UA: NEGATIVE
Ketones, UA: NEGATIVE
Leukocytes, UA: NEGATIVE
Nitrite, UA: NEGATIVE
Protein, UA: NEGATIVE
Spec Grav, UA: 1.025 (ref 1.010–1.025)
Urobilinogen, UA: 0.2 E.U./dL
pH, UA: 5.5 (ref 5.0–8.0)

## 2019-03-27 NOTE — Progress Notes (Unsigned)
yellw

## 2019-03-28 LAB — URINE CULTURE
MICRO NUMBER:: 748503
Result:: NO GROWTH
SPECIMEN QUALITY:: ADEQUATE

## 2019-03-31 NOTE — Progress Notes (Signed)
 HPI :  58-year-old female here for a follow-up visit. Please see prior notes for details of her case, she has, history of refractory H. Pylori, history of diverticulitis status post surgical resection, history of C. Difficile, history of GERD.  Since her last visit she had an upper endoscopy in February 2020.  Biopsies of her stomach for H. pylori were negative and she has successfully eradicated H. pylori which was difficult to treat.  She also had an ultrasound of her upper abdomen which was normal in March.  Her last colonoscopy was in February 2020 at which time she did not have any significant bowel symptoms.  She had a few small polyps removed and diverticulosis noted.  Her main issue today is a change in bowel habits.  She feels since March of this year she has had intermittent problems with leakage of feces.  When this started it was an occasional occurrence, but now it is a almost daily occurrence and much more bothersome.  She has intermittent soft to loose stools, usually a few bowel movements per day.  She does not have frank fecal incontinence with her bowel movements, that is a very rare occurrence.  Her main issue is that she feels stool leaking out of her throughout the day which can occur at work.  She feels like she cannot squeeze her pelvic muscles as well as she could in the past.  She also endorses history of stress urinary incontinence.  She denies any numbness or paresthesias in her anal area.  She is never had any prior perianal surgeries.  She is using barrier cream around her anal area to to prevent tissue irritation.  She is currently not taking anything for this.  She does not take anything for her bowels in general.  She has not had any recent changes in her medications.  She has been on metformin for over a year and she thinks tolerates it well.  Endoscopic history: EGD 04/2008 - normal esophagus, H pylori gastriits, normal duodenum Colonoscopy in 2012 with a 3mm descending  colon polyp removed but not retrieved. Colonoscopy 09/30/2015 - seven small colon polyps - adenomas, sessile serrated, recall in 3 years, pancolonic diverticulosis, and hemorrhoids EGD 05/17/16 - normal esophagus, benign gastric polyps, gastritis, normal duodenum. Biopsies show no celiac, gastric polyps benign, H pylori gastritis noted EGD 06/04/2017 - gastritis, biposies taken, prominent ampulla - 2cm HH, otherwise normal esophagus - H pylori attempted to be cultured and could not. She followed up with ID  Colonoscopy 10/16/18 - One 3 mm polyp in the sigmoid colon, removed with a cold snare. Resected and retrieved. - One diminutive polyp at the recto-sigmoid colon, removed with a cold snare. Resected and retrieved. - One 3 mm polyp in the rectum, removed with a cold snare. Resected and retrieved. - Diverticulosis in the entire examined colon. - End-to-end colo-colonic anastomosis, characterized by healthy appearing mucosa. - The examination was otherwise normal.   EGD 10/16/18" - 1 cm hiatal hernia. - Normal esophagus - Erythematous mucosa in the gastric fundus and gastric body. - Normal stomach otherwise - biopsies taken to rule out H pylori and negative - Normal duodenal bulb and second portion of the duodenum.  US 11/04/18 - normal    Past Medical History:  Diagnosis Date  . Allergy   . Anxiety   . Asthma    pt has inhaler  . Back pain   . C. difficile colitis   . Cataract    bil eyes  .   Chest pain   . Chronic fatigue syndrome   . Clostridium difficile colitis 12/13/2016  . Diverticulitis   . Dyspnea   . Dysrhythmia    palpitations  . Edema   . Elevated blood pressure reading without diagnosis of hypertension    "just elevated when I'm in pain" (12/07/2015)  . Fatty liver   . Fibromyalgia   . Ganglion of joint    right wrist  . GERD (gastroesophageal reflux disease)   . H. pylori infection   . Headache    hx of  . Helicobacter pylori (H. pylori) infection 12/13/2016   . Herpes zoster without mention of complication   . HTN (hypertension)   . Hyperlipidemia   . Hyperthyroidism   . IBS (irritable bowel syndrome)   . Leg edema   . Multiple drug allergies 06/18/2017  . Myalgia   . Nontraumatic rupture of Achilles tendon   . Other acute reactions to stress   . Pain in joint, shoulder region   . Pain in joint, upper arm   . Pain in limb   . Palpitations   . Personal history of other diseases of digestive system   . Pneumonia    once  . PONV (postoperative nausea and vomiting)   . Routine screening for STI (sexually transmitted infection) 12/13/2016  . S/P partial colectomy 06/18/2017  . Thyroid disease    hyperthyroidism  . Type 2 diabetes mellitus with hyperglycemia, without long-term current use of insulin (HCC) 08/07/2018  . Vitamin B 12 deficiency      Past Surgical History:  Procedure Laterality Date  . ABDOMINAL HYSTERECTOMY    . ACHILLES TENDON REPAIR Right 2005  . BUNIONECTOMY Bilateral    Bunionectomy 1983  . COLON SURGERY  01/23/2017   6 to 8 inches sigmoid colon removed  . COLONOSCOPY    . GANGLION CYST EXCISION Right    wrist  . LEFT HEART CATH AND CORONARY ANGIOGRAPHY N/A 10/03/2016   Procedure: Left Heart Cath and Coronary Angiography;  Surgeon: Christopher D McAlhany, MD;  Location: [facility];  Service: Cardiovascular;  Laterality: N/A;  . MENISCUS REPAIR Right 04/2014  . POLYPECTOMY    . TUBAL LIGATION    . wisdomteeth extraction     Family History  Problem Relation Age of Onset  . Hypertension Father   . Diabetes Father   . Heart disease Father   . Kidney disease Father        Died, 50  . Hyperlipidemia Father   . Leukemia Brother   . Cancer Brother        myleoblastic anemia  . Hyperlipidemia Mother   . Hypertension Mother        Died, 84  . Thyroid disease Mother        Thyroid surgery  . Obesity Mother   . Heart disease Mother   . Pernicious anemia Sister   . Thyroid disease Sister        On  thyroid Rx  . Lupus Daughter   . Coronary artery disease Brother   . Hypertension Brother   . Colon cancer Neg Hx   . Esophageal cancer Neg Hx   . Rectal cancer Neg Hx   . Stomach cancer Neg Hx   . Pancreatic cancer Neg Hx   . Prostate cancer Neg Hx   . Colon polyps Neg Hx    Social History   Tobacco Use  . Smoking status: Never Smoker  . Smokeless tobacco: Never Used    Substance Use Topics  . Alcohol use: No    Alcohol/week: 0.0 standard drinks  . Drug use: No   Current Outpatient Medications  Medication Sig Dispense Refill  . acetaminophen (TYLENOL) 500 MG tablet Take 1,000 mg by mouth every 6 (six) hours as needed for moderate pain.     Marland Kitchen albuterol (PROAIR HFA) 108 (90 Base) MCG/ACT inhaler USE 2 PUFFS EVERY 4 HOURS AS NEEDED FOR COUGH, WHEEZE OR SHORTNESS OF BREATH. 8.5 each 2  . ALPRAZolam (XANAX) 0.25 MG tablet Take 1 tablet (0.25 mg total) by mouth 3 (three) times daily as needed. 30 tablet 2  . atenolol (TENORMIN) 25 MG tablet TAKE 1/2 TABLETS (12.5 MG TOTAL) BY MOUTH DAILY 45 tablet 3  . azelastine (OPTIVAR) 0.05 % ophthalmic solution Place 1 drop into both eyes 2 (two) times daily as needed.    . Blood Glucose Monitoring Suppl (FREESTYLE FREEDOM) KIT 1 Device by Does not apply route daily. 1 each 0  . budesonide-formoterol (SYMBICORT) 80-4.5 MCG/ACT inhaler Take 2 puffs first thing in am and then another 2 puffs about 12 hours later. 1 Inhaler 11  . dexlansoprazole (DEXILANT) 60 MG capsule Take 1 capsule (60 mg total) by mouth daily. 90 capsule 0  . diclofenac sodium (VOLTAREN) 1 % GEL Apply 2 g topically 4 (four) times daily. Rub into affected area of foot 2 to 4 times daily 100 g 2  . famotidine (PEPCID) 20 MG tablet One after supper 30 tablet 11  . fexofenadine (ALLEGRA) 180 MG tablet Take 1 tablet (180 mg total) by mouth daily. 90 tablet 3  . fluticasone (FLONASE) 50 MCG/ACT nasal spray PLACE 2 SPRAYS INTO BOTH NOSTRILS DAILY. 16 g 4  . FREESTYLE LITE test strip USE  TO CHECK BLOOD SUGAR ONCE A DAY 100 each 0  . Iron Combinations (I.L.X. B-12) ELIX Take 1 each by mouth daily as needed (energy). 1 droplet full    . lidocaine (LIDODERM) 5 % Apply to affected area QD PRN for a Week 30 patch 0  . metFORMIN (GLUCOPHAGE) 500 MG tablet Take 1 tablet (500 mg total) by mouth daily with breakfast. 30 tablet 0  . rosuvastatin (CRESTOR) 10 MG tablet Take 1 tablet (10 mg total) by mouth daily. 90 tablet 3  . valACYclovir (VALTREX) 1000 MG tablet Take 1,000 mg by mouth 3 (three) times daily as needed.    . Vitamin D, Ergocalciferol, (DRISDOL) 1.25 MG (50000 UT) CAPS capsule Take 1 capsule (50,000 Units total) by mouth every 7 (seven) days. 4 capsule 0   No current facility-administered medications for this visit.    Allergies  Allergen Reactions  . Aspirin Other (See Comments)    REACTION: anaphylaxis  . Gentamicin Other (See Comments)    Eye drops turned the sclera bright red  . Ibuprofen Other (See Comments)    REACTION: anaphylaxsis  . Metronidazole Swelling    REACTION: red face/swelling  . Nsaids Other (See Comments)    REACTION: anaphylaxis  . Doxycycline Other (See Comments)    REACTION: severe nausea/vomiting  . Influenza A (H1n1) Monoval Vac Other (See Comments)    REACTION: sick for 3 weeks  . Loratadine Other (See Comments)    Fatigue/weakness  . Periactin [Cyproheptadine] Other (See Comments)    paranoid   . Ranitidine Hcl Other (See Comments)    REACTION: Lips turned red /peel  . Sulfamethoxazole-Trimethoprim Other (See Comments)    REACTION: face red/peel  . Tramadol Hcl Other (See Comments)  REACTION: paranoid     Review of Systems: All systems reviewed and negative except where noted in HPI.   Lab Results  Component Value Date   WBC 5.2 11/27/2018   HGB 13.5 11/27/2018   HCT 41.6 11/27/2018   MCV 88.5 11/27/2018   PLT 253 11/27/2018    Lab Results  Component Value Date   CREATININE 0.85 12/11/2018   BUN 18 12/11/2018    NA 138 12/11/2018   K 4.2 12/11/2018   CL 105 12/11/2018   CO2 26 12/11/2018    Lab Results  Component Value Date   ALT 32 12/11/2018   AST 24 12/11/2018   ALKPHOS 59 12/11/2018   BILITOT 0.5 12/11/2018     Physical Exam: BP 108/70   Pulse 73   Temp 97.9 F (36.6 C)   Ht 5' 5" (1.651 m)   Wt 241 lb (109.3 kg)   BMI 40.10 kg/m  Constitutional: Pleasant,well-developed, female in no acute distress. HEENT: Normocephalic and atraumatic. Conjunctivae are normal. No scleral icterus. Neck supple.  Cardiovascular: Normal rate, regular rhythm.  Pulmonary/chest: Effort normal and breath sounds normal. No wheezing, rales or rhonchi. Abdominal: Soft, nondistended, nontender. . There are no masses palpable.  Rectal / DRE - standby Jan Hogan CMA - normal sensory exam / perianal area okay, slightly low baseline tone, weak squeeze pressure, normal decent of pelvic floor Extremities: no edema Lymphadenopathy: No cervical adenopathy noted. Neurological: Alert and oriented to person place and time. Skin: Skin is warm and dry. No rashes noted. Psychiatric: Normal mood and affect. Behavior is normal.   ASSESSMENT AND PLAN: 58-year-old female here for reassessment of the following issues:  Fecal leakage - initially an intermittent issue that started in March, now much more frequent issue which is now bothering her at work.  Her rectal exam is concerning for a weakened pelvic floor, which could also be associated with what sounds like stress urinary incontinence she is experiencing.  I am going to refer her to pelvic floor physical therapy to help strengthen the pelvic floor and see if this helps her.  She was agreeable to this following discussion of her options.  I also recommend that she take a daily fiber supplement to help bulk her stools, recommend Citrucel once daily.  Hopefully this provides some benefit to her.  If not and her symptoms persist despite the pelvic floor PT evaluation, she  should call me back for reassessment.  History of H. Pylori - eradicated based on findings on last EGD as well as her prior symptoms which have since resolved  GERD - has been well controlled on Dexilant and she prefers to take this as it has worked better for her than other regimens.  We will continue for now.   , MD [facility]   

## 2019-04-01 ENCOUNTER — Other Ambulatory Visit: Payer: Self-pay

## 2019-04-01 ENCOUNTER — Encounter: Payer: Self-pay | Admitting: Gastroenterology

## 2019-04-01 ENCOUNTER — Ambulatory Visit (INDEPENDENT_AMBULATORY_CARE_PROVIDER_SITE_OTHER): Payer: 59 | Admitting: Gastroenterology

## 2019-04-01 VITALS — BP 108/70 | HR 73 | Temp 97.9°F | Ht 65.0 in | Wt 241.0 lb

## 2019-04-01 DIAGNOSIS — R151 Fecal smearing: Secondary | ICD-10-CM | POA: Diagnosis not present

## 2019-04-01 DIAGNOSIS — K219 Gastro-esophageal reflux disease without esophagitis: Secondary | ICD-10-CM | POA: Diagnosis not present

## 2019-04-01 DIAGNOSIS — Z8619 Personal history of other infectious and parasitic diseases: Secondary | ICD-10-CM | POA: Diagnosis not present

## 2019-04-01 NOTE — Patient Instructions (Addendum)
If you are age 59 or older, your body mass index should be between 23-30. Your Body mass index is 40.1 kg/m. If this is out of the aforementioned range listed, please consider follow up with your Primary Care Provider.  If you are age 46 or younger, your body mass index should be between 19-25. Your Body mass index is 40.1 kg/m. If this is out of the aformentioned range listed, please consider follow up with your Primary Care Provider.   To help prevent the possible spread of infection to our patients, communities, and staff; we will be implementing the following measures:  As of now we are not allowing any visitors/family members to accompany you to any upcoming appointments with [facility]. If you have any concerns about this please contact our office to discuss prior to the appointment.   Please purchase the following medications over the counter and take as directed: Citrucel powder: Take daily as directed   We will refer you to Pelvic Floor Physical Therapy.  They will call you to schedule an appointment.  Please let us know if you have not heard from them within a week or two.   Thank you for entrusting me with your care and for choosing [facility], Dr. Edcouch Cellar

## 2019-04-09 ENCOUNTER — Ambulatory Visit: Payer: 59 | Attending: Gastroenterology | Admitting: Physical Therapy

## 2019-04-09 ENCOUNTER — Encounter: Payer: Self-pay | Admitting: Physical Therapy

## 2019-04-09 ENCOUNTER — Other Ambulatory Visit: Payer: Self-pay

## 2019-04-09 DIAGNOSIS — M25551 Pain in right hip: Secondary | ICD-10-CM | POA: Diagnosis not present

## 2019-04-09 DIAGNOSIS — M6281 Muscle weakness (generalized): Secondary | ICD-10-CM | POA: Insufficient documentation

## 2019-04-09 DIAGNOSIS — R252 Cramp and spasm: Secondary | ICD-10-CM | POA: Diagnosis not present

## 2019-04-09 NOTE — Therapy (Signed)
[facility] [address] [place], [place], [address] Phone: [phone]   Fax:  [phone]  Physical Therapy Evaluation  Patient Details  Name: Emily Osborne MRN: [id number] Date of Birth: 10-02-1960 Referring Provider (PT): Armbruster, Carlota Raspberry, MD   Encounter Date: 04/09/2019  PT End of Session - 04/09/19 1059    Visit Number  1    Date for PT Re-Evaluation  06/04/19    PT Start Time  0940    PT Stop Time  1025    PT Time Calculation (min)  45 min    Activity Tolerance  Patient tolerated treatment well    Behavior During Therapy  [facility] for tasks assessed/performed       Past Medical History:  Diagnosis Date  . Allergy   . Anxiety   . Asthma    pt has inhaler  . Back pain   . C. difficile colitis   . Cataract    bil eyes  . Chest pain   . Chronic fatigue syndrome   . Clostridium difficile colitis 12/13/2016  . Diverticulitis   . Dyspnea   . Dysrhythmia    palpitations  . Edema   . Elevated blood pressure reading without diagnosis of hypertension    "just elevated when I'm in pain" (12/07/2015)  . Fatty liver   . Fibromyalgia   . Ganglion of joint    right wrist  . GERD (gastroesophageal reflux disease)   . H. pylori infection   . Headache    hx of  . Helicobacter pylori (H. pylori) infection 12/13/2016  . Herpes zoster without mention of complication   . HTN (hypertension)   . Hyperlipidemia   . Hyperthyroidism   . IBS (irritable bowel syndrome)   . Leg edema   . Multiple drug allergies 06/18/2017  . Myalgia   . Nontraumatic rupture of Achilles tendon   . Other acute reactions to stress   . Pain in joint, shoulder region   . Pain in joint, upper arm   . Pain in limb   . Palpitations   . Personal history of other diseases of digestive system   . Pneumonia    once  . PONV (postoperative nausea and vomiting)   . Routine screening for STI (sexually transmitted infection) 12/13/2016  . S/P  partial colectomy 06/18/2017  . Thyroid disease    hyperthyroidism  . Type 2 diabetes mellitus with hyperglycemia, without long-term current use of insulin ([place]) 08/07/2018  . Vitamin B 12 deficiency     Past Surgical History:  Procedure Laterality Date  . ABDOMINAL HYSTERECTOMY    . ACHILLES TENDON REPAIR Right 2005  . BUNIONECTOMY Bilateral    Bunionectomy 1983  . COLON SURGERY  01/23/2017   6 to 8 inches sigmoid colon removed  . COLONOSCOPY    . GANGLION CYST EXCISION Right    wrist  . LEFT HEART CATH AND CORONARY ANGIOGRAPHY N/A 10/03/2016   Procedure: Left Heart Cath and Coronary Angiography;  Surgeon: Burnell Blanks, MD;  Location: [place] [facility];  Service: Cardiovascular;  Laterality: N/A;  . MENISCUS REPAIR Right 04/2014  . POLYPECTOMY    . TUBAL LIGATION    . wisdomteeth extraction      There were no vitals filed for this visit.   Subjective Assessment - 04/09/19 0942    Subjective  Pt states leakage it started to get worse in March, but has been gradually getting worse to be every day  in the last couple weeks.  Pt has been having urinary leakage for a long time and it is a little here and there.  Pt also report hip pain in Rt hip that has caused her to be unable to walk as much as she was in March.  No pain currently but starts when she performs single leg stand on the Rt side.    Pertinent History  sigmoid colon removed about 1 year, myalgia    Patient Stated Goals  Stop having leakage    Currently in Pain?  No/denies   Rt hip pain with walking        [facility] PT Assessment - 04/09/19 0001      Assessment   Medical Diagnosis  R15.1 (ICD-10-CM) - Fecal soiling    Referring Provider (PT)  Armbruster, Carlota Raspberry, MD    Onset Date/Surgical Date  11/07/18    Prior Therapy  Yes for pelvic pain after having her daughter      Precautions   Precautions  None      Restrictions   Weight Bearing Restrictions  No      Balance Screen   Has the patient fallen in  the past 6 months  No      [place] residence    Living Arrangements  Children   one daughter     Prior Function   Level of Independence  Independent    Vocation  Full time employment    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      Cognition   Overall Cognitive Status  Within Functional Limits for tasks assessed      Posture/Postural Control   Posture/Postural Control  Postural limitations    Postural Limitations  Increased lumbar lordosis;Rounded Shoulders      ROM / Strength   AROM / PROM / Strength  Strength;PROM      PROM   Overall PROM Comments  Rt hip flexion and IR 50% limited and painful      Strength   Overall Strength Comments  knee 5/5; hip 5/5 except as stated below    Strength Assessment Site  Hip;Knee    Right/Left Hip  Right;Left    Right Hip Flexion  4-/5   pain   Right Hip ABduction  4-/5   pain     Flexibility   Soft Tissue Assessment /Muscle Length  yes    Hamstrings  WNL      Palpation   Palpation comment  TFL and GT bursea on Rt hip - TTP; adductors TTP and tight bilat      Special Tests   Other special tests  single leg stand on RT demonsrates more instability and hip drop      Ambulation/Gait   Gait Pattern  Decreased step length - left;Decreased stance time - right                Objective measurements completed on examination: See above findings.    Pelvic Floor Special Questions - 04/09/19 0001    Prior Pelvic/Prostate Exam  Yes    Are you Pregnant or attempting pregnancy?  No    Prior Pregnancies  Yes    Number of Pregnancies  2    Number of Vaginal Deliveries  2    Currently Sexually Active  No    Urinary Leakage  Yes    How often  every day  Pad use  1/day    Urinary urgency  Yes   1/day   Fecal incontinence  Yes   have to go multiple times   Falling out feeling (prolapse)  --   muscle spasm   External Palpation  tight  and tender adductor attachment    Prolapse  Anterior Wall   a little prolapse noted   Pelvic Floor Internal Exam  identity confirmed and informed consent given to perform internal soft tissue assess and treatment    Exam Type  Vaginal    Sensation  less on Rt side    Palpation  high tone throughout    Strength  weak squeeze, no lift    Strength # of seconds  2    Tone  high       [facility] Adult PT Treatment/Exercise - 04/09/19 0001      Self-Care   Self-Care  Other Self-Care Comments    Other Self-Care Comments   initial HEP  Access Code: [email or web address] performed and educated             PT Education - 04/09/19 1034    Education Details  Access Code: [email or web address]    Person(s) Educated  Patient    Methods  Explanation;Demonstration;Tactile cues;Verbal cues;Handout    Comprehension  Verbalized understanding;Returned demonstration       PT Short Term Goals - 04/09/19 1042      PT SHORT TERM GOAL #1   Title  pt will report leakage reduced to 4-5 days/ week    Time  4    Period  Weeks    Status  New    Target Date  05/07/19      PT SHORT TERM GOAL #2   Title  pt will report she is able to use the bathroom only 2x in the morning    Time  4    Period  Weeks    Status  New    Target Date  05/07/19        PT Long Term Goals - 04/09/19 1044      PT LONG TERM GOAL #1   Title  pt will be ind with HEP in order to maintain muscle strength for bowel control    Time  8    Period  Weeks    Status  New    Target Date  06/04/19      PT LONG TERM GOAL #2   Title  pt will report leakage at most 1 day/week    Time  8    Period  Weeks    Status  New    Target Date  06/04/19      PT LONG TERM GOAL #3   Title  Pt will report Rt hip pain reduced by 50%    Time  8    Period  Weeks    Status  New    Target Date  06/04/19      PT LONG TERM GOAL #4   Title  Pt will report she is able to feel the urge to have a BM with 100% accuracy    Time  8    Period  Weeks    Status  New     Target Date  06/04/19             Plan - 04/09/19 1034    Clinical Impression Statement  Pt is very friendly 58 y/o woman who is coming to the clinic due to fecal soiling that  has been worsening for the last few months.  She demonstrates weakness of pelvic floor of 1-2/5 MMT with high tone.  She has muscle tension and tenderness of palpation throughout her LE Rt>Lt as well as hip weakness Rt>Lt. Pt also has Rt hip pain.  She has been experiencing pelvic floor muscle spasms and pain for years that occurs intermittently as well as some urinary leakage.  PT palpated tenderness of GT bursa on the Rt and Rt TFL TTP and tight.  Pt will benefit from skilled PT to address all previously mentioned impairments in order to improve her abilty to do community activities as well as be effective at work.    Personal Factors and Comorbidities  Profession;Comorbidity 3+;Age    Comorbidities  2 vaginal deliveries close together, history of pelvic pain chronic, s/p removal of sigmoid    Examination-Activity Limitations  Toileting;Locomotion Level    Examination-Participation Restrictions  Community Activity    Stability/Clinical Decision Making  Evolving/Moderate complexity    Clinical Decision Making  Moderate    Rehab Potential  Good    PT Frequency  2x / week    PT Duration  8 weeks    PT Treatment/Interventions  ADLs/Self Care Home Management;Biofeedback;Cryotherapy;Electrical Stimulation;Iontophoresis 4mg /ml Dexamethasone;Moist Heat;Therapeutic activities;Therapeutic exercise;Manual techniques;Taping;Dry needling;Patient/family education;Neuromuscular re-education    PT Next Visit Plan  biofeedback anal sphincters, maybe assess rectally, adductor stretches, hip extension stretches, review breathing and HEP    PT Home Exercise Plan  Access Code: [email or web address]    Consulted and Agree with Plan of Care  Patient       Patient will benefit from skilled therapeutic intervention in order to improve the following  deficits and impairments:  Pain, Increased muscle spasms, Decreased strength, Impaired tone, Postural dysfunction, Impaired flexibility  Visit Diagnosis: Muscle weakness (generalized)  Cramp and spasm  Pain in right hip     Problem List Patient Active Problem List   Diagnosis Date Noted  . Upper airway cough syndrome vs cough variant asthma 01/01/2019  . Chest pain 12/09/2018  . Prediabetes 10/07/2018  . Preventative health care 08/07/2018  . Anxiety 10/11/2017  . S/P partial colectomy 06/18/2017  . Multiple drug allergies 06/18/2017  . Colitis 02/05/2017  . Diarrhea 02/05/2017  . Diverticular disease 01/23/2017  . Helicobacter pylori (H. pylori) infection 12/13/2016  . Routine screening for STI (sexually transmitted infection) 12/13/2016  . Clostridium difficile colitis 12/13/2016  . Diverticulitis 10/10/2016  . Lower abdominal pain 09/29/2016  . Palpitations 02/06/2016  . Mild diastolic dysfunction 95/18/8416  . Graves disease 01/02/2016  . Acute diverticulitis 12/07/2015  . Acute bacterial sinusitis 10/04/2015  . History of colonic polyps 11/15/2014  . Left shoulder pain 09/28/2013  . Left-sided weakness 09/16/2013  . Obesity (BMI 30-39.9) 09/02/2013  . Myalgia 06/21/2011  . POSTMENOPAUSAL STATUS 09/07/2010  . PAIN IN JOINT, MULTIPLE SITES 12/27/2009  . LOW BACK PAIN, CHRONIC 12/27/2009  . FATIGUE 12/27/2009  . VAGINITIS 11/22/2009  . DYSPEPSIA&OTHER [facility] DISORDERS FUNCTION STOMACH 09/01/2009  . NAUSEA 08/03/2009  . FLATULENCE-GAS-BLOATING 08/03/2009  . ABDOMINAL PAIN RIGHT UPPER QUADRANT 07/06/2009  . SUPRAPUBIC PAIN 07/06/2009  . GANGLION CYST, HX OF 07/06/2009  . BACK PAIN 05/04/2009  . Hyperlipidemia 09/16/2008  . UNSPECIFIED MYALGIA AND MYOSITIS 09/16/2008  . Precordial pain 07/27/2008  . GERD 06/08/2008  . HELICOBACTER PYLORI GASTRITIS, HX OF 04/05/2008  . OTHER ACUTE REACTIONS TO STRESS 03/16/2008  . ABDOMINAL PAIN, EPIGASTRIC 03/16/2008  .  SHOULDER PAIN, RIGHT 10/01/2007  . ELBOW PAIN, RIGHT 10/01/2007  .  Pain in limb 04/14/2007  . DEPENDENT EDEMA, RIGHT LEG 04/14/2007  . SHINGLES 04/08/2007  . Asthma 03/18/2007  . GANGLION CYST, WRIST, RIGHT 03/18/2007  . BUNIONECTOMY, HX OF 03/18/2007    Jule Ser, PT 04/09/2019, 11:00 AM  [facility] [address], [place], [place], [address] Phone: [phone]   Fax:  [phone]  Name: DELORIES MAURI MRN: [id number] Date of Birth: August 19, 1961

## 2019-04-09 NOTE — Patient Instructions (Signed)
Access Code: [email or web address]  URL: [email or web address]  Date: 04/09/2019  Prepared by: Jari Favre   Exercises  Supine Diaphragmatic Breathing with Pelvic Floor Lengthening - 10 reps - 1 sets - 3x daily - 7x weekly  Supine Lower Trunk Rotation - 10 reps - 3 sets - 1x daily - 7x weekly  Supine Hamstring Stretch with Strap - 3 reps - 1 sets - 30 sec hold - 1x daily - 7x weekly

## 2019-04-10 ENCOUNTER — Other Ambulatory Visit (INDEPENDENT_AMBULATORY_CARE_PROVIDER_SITE_OTHER): Payer: Self-pay | Admitting: Family Medicine

## 2019-04-10 DIAGNOSIS — R7303 Prediabetes: Secondary | ICD-10-CM

## 2019-04-10 DIAGNOSIS — E559 Vitamin D deficiency, unspecified: Secondary | ICD-10-CM

## 2019-04-10 MED FILL — DEXILANT DR 60 MG CAPSULE: 60 | 90 days supply | Qty: 90 | Fill #1

## 2019-04-10 MED FILL — FLUTICASONE PROP 50 MCG SPR: 50 | 30 days supply | Qty: 16 | Fill #2

## 2019-04-10 MED FILL — ATENOLOL 25 MG TABLET: 25 | 30 days supply | Qty: 45 | Fill #2

## 2019-04-15 ENCOUNTER — Encounter: Payer: Self-pay | Admitting: Family Medicine

## 2019-04-15 MED FILL — SYMBICORT 80-4.5 MCG INH: 80-4.5 | 30 days supply | Qty: 10 | Fill #1

## 2019-04-16 ENCOUNTER — Other Ambulatory Visit: Payer: Self-pay

## 2019-04-16 MED ORDER — FREESTYLE LITE TEST VI STRP: ORAL_STRIP | 1 refills | Status: DC

## 2019-04-16 MED FILL — FREESTYLE LITE TEST STRIP: 90 days supply | Qty: 100 | Fill #0

## 2019-04-21 ENCOUNTER — Ambulatory Visit: Payer: 59 | Admitting: Physical Therapy

## 2019-04-23 ENCOUNTER — Encounter: Payer: Self-pay | Admitting: Physical Therapy

## 2019-04-23 ENCOUNTER — Ambulatory Visit: Payer: 59 | Attending: Gastroenterology | Admitting: Physical Therapy

## 2019-04-23 ENCOUNTER — Other Ambulatory Visit: Payer: Self-pay

## 2019-04-23 DIAGNOSIS — M6281 Muscle weakness (generalized): Secondary | ICD-10-CM | POA: Diagnosis not present

## 2019-04-23 DIAGNOSIS — M25551 Pain in right hip: Secondary | ICD-10-CM | POA: Insufficient documentation

## 2019-04-23 DIAGNOSIS — R252 Cramp and spasm: Secondary | ICD-10-CM | POA: Diagnosis not present

## 2019-04-23 NOTE — Therapy (Signed)
[facility] [address], [place], [place], [address] Phone: [phone]   Fax:  [phone]  Physical Therapy Treatment  Patient Details  Name: Emily Osborne MRN: [id number] Date of Birth: 07/16/1961 Referring Provider (PT): Armbruster, Carlota Raspberry, MD   Encounter Date: 04/23/2019  PT End of Session - 04/23/19 0854    Visit Number  2    Date for PT Re-Evaluation  06/04/19    PT Start Time  0851    PT Stop Time  0929    PT Time Calculation (min)  38 min    Activity Tolerance  Patient tolerated treatment well    Behavior During Therapy  [facility] for tasks assessed/performed       Past Medical History:  Diagnosis Date  . Allergy   . Anxiety   . Asthma    pt has inhaler  . Back pain   . C. difficile colitis   . Cataract    bil eyes  . Chest pain   . Chronic fatigue syndrome   . Clostridium difficile colitis 12/13/2016  . Diverticulitis   . Dyspnea   . Dysrhythmia    palpitations  . Edema   . Elevated blood pressure reading without diagnosis of hypertension    "just elevated when I'm in pain" (12/07/2015)  . Fatty liver   . Fibromyalgia   . Ganglion of joint    right wrist  . GERD (gastroesophageal reflux disease)   . H. pylori infection   . Headache    hx of  . Helicobacter pylori (H. pylori) infection 12/13/2016  . Herpes zoster without mention of complication   . HTN (hypertension)   . Hyperlipidemia   . Hyperthyroidism   . IBS (irritable bowel syndrome)   . Leg edema   . Multiple drug allergies 06/18/2017  . Myalgia   . Nontraumatic rupture of Achilles tendon   . Other acute reactions to stress   . Pain in joint, shoulder region   . Pain in joint, upper arm   . Pain in limb   . Palpitations   . Personal history of other diseases of digestive system   . Pneumonia    once  . PONV (postoperative nausea and vomiting)   . Routine screening for STI (sexually transmitted infection) 12/13/2016  . S/P  partial colectomy 06/18/2017  . Thyroid disease    hyperthyroidism  . Type 2 diabetes mellitus with hyperglycemia, without long-term current use of insulin ([place]) 08/07/2018  . Vitamin B 12 deficiency     Past Surgical History:  Procedure Laterality Date  . ABDOMINAL HYSTERECTOMY    . ACHILLES TENDON REPAIR Right 2005  . BUNIONECTOMY Bilateral    Bunionectomy 1983  . COLON SURGERY  01/23/2017   6 to 8 inches sigmoid colon removed  . COLONOSCOPY    . GANGLION CYST EXCISION Right    wrist  . LEFT HEART CATH AND CORONARY ANGIOGRAPHY N/A 10/03/2016   Procedure: Left Heart Cath and Coronary Angiography;  Surgeon: Burnell Blanks, MD;  Location: [place] [facility];  Service: Cardiovascular;  Laterality: N/A;  . MENISCUS REPAIR Right 04/2014  . POLYPECTOMY    . TUBAL LIGATION    . wisdomteeth extraction      There were no vitals filed for this visit.  Subjective Assessment - 04/23/19 0927    Subjective  I have been doing better since the last time I was here.  Only 2x I had leakge and have been  feeling less wet.    Patient Stated Goals  Stop having leakage    Currently in Pain?  No/denies                       [facility] Adult PT Treatment/Exercise - 04/23/19 0001      Neuro Re-ed    Neuro Re-ed Details   used biofeedback - tactile cues to engage TrA and relax lumbar paraspinlas, cues for correct coordination of breathing throughout      Exercises   Exercises  Lumbar      Lumbar Exercises: Supine   Ab Set  10 reps;2 seconds   kegel   Bridge  10 reps;2 seconds    Bridge with Ball Squeeze Limitations  ball squeeze with kegel - 10x 3 sec      Lumbar Exercises: Sidelying   Clam  Left;15 reps;2 seconds             PT Education - 04/23/19 0927    Education Details  Access Code: [email or web address]    Person(s) Educated  Patient    Methods  Explanation;Demonstration;Tactile cues;Verbal cues    Comprehension  Verbalized understanding;Returned demonstration        PT Short Term Goals - 04/23/19 0935      PT SHORT TERM GOAL #1   Title  pt will report leakage reduced to 4-5 days/ week    Baseline  2x this last week    Period  Weeks    Status  Achieved      PT SHORT TERM GOAL #2   Title  pt will report she is able to use the bathroom only 2x in the morning    Status  On-going        PT Long Term Goals - 04/09/19 1044      PT LONG TERM GOAL #1   Title  pt will be ind with HEP in order to maintain muscle strength for bowel control    Time  8    Period  Weeks    Status  New    Target Date  06/04/19      PT LONG TERM GOAL #2   Title  pt will report leakage at most 1 day/week    Time  8    Period  Weeks    Status  New    Target Date  06/04/19      PT LONG TERM GOAL #3   Title  Pt will report Rt hip pain reduced by 50%    Time  8    Period  Weeks    Status  New    Target Date  06/04/19      PT LONG TERM GOAL #4   Title  Pt will report she is able to feel the urge to have a BM with 100% accuracy    Time  8    Period  Weeks    Status  New    Target Date  06/04/19            Plan - 04/23/19 0931    Clinical Impression Statement  Pt did well with initial exercises.  She was assessed with biofeedback and able to get about 23mV contraction without holding her breath.  She needs a lof of cues to prevent bearing down and breath holding with exercises.  She is only able to hold 2 seconds without co-contracting gluteals and adductors.  Pt is seeing results from initial HEP and will continue  to benefit from skilled PT for improved strength and endurance for bowel and bladder control.    Comorbidities  2 vaginal deliveries close together, history of pelvic pain chronic, s/p removal of sigmoid    PT Treatment/Interventions  ADLs/Self Care Home Management;Biofeedback;Cryotherapy;Electrical Stimulation;Iontophoresis 4mg /ml Dexamethasone;Moist Heat;Therapeutic activities;Therapeutic exercise;Manual techniques;Taping;Dry  needling;Patient/family education;Neuromuscular re-education    PT Next Visit Plan  continue strength and LE and hip stretches, biofeedback    PT Home Exercise Plan  Access Code: [email or web address]    Consulted and Agree with Plan of Care  Patient       Patient will benefit from skilled therapeutic intervention in order to improve the following deficits and impairments:  Pain, Increased muscle spasms, Decreased strength, Impaired tone, Postural dysfunction, Impaired flexibility  Visit Diagnosis: Muscle weakness (generalized)  Cramp and spasm  Pain in right hip     Problem List Patient Active Problem List   Diagnosis Date Noted  . Upper airway cough syndrome vs cough variant asthma 01/01/2019  . Chest pain 12/09/2018  . Prediabetes 10/07/2018  . Preventative health care 08/07/2018  . Anxiety 10/11/2017  . S/P partial colectomy 06/18/2017  . Multiple drug allergies 06/18/2017  . Colitis 02/05/2017  . Diarrhea 02/05/2017  . Diverticular disease 01/23/2017  . Helicobacter pylori (H. pylori) infection 12/13/2016  . Routine screening for STI (sexually transmitted infection) 12/13/2016  . Clostridium difficile colitis 12/13/2016  . Diverticulitis 10/10/2016  . Lower abdominal pain 09/29/2016  . Palpitations 02/06/2016  . Mild diastolic dysfunction [id number]  . Graves disease 01/02/2016  . Acute diverticulitis 12/07/2015  . Acute bacterial sinusitis 10/04/2015  . History of colonic polyps 11/15/2014  . Left shoulder pain 09/28/2013  . Left-sided weakness 09/16/2013  . Obesity (BMI 30-39.9) 09/02/2013  . Myalgia 06/21/2011  . POSTMENOPAUSAL STATUS 09/07/2010  . PAIN IN JOINT, MULTIPLE SITES 12/27/2009  . LOW BACK PAIN, CHRONIC 12/27/2009  . FATIGUE 12/27/2009  . VAGINITIS 11/22/2009  . DYSPEPSIA&OTHER [facility] DISORDERS FUNCTION STOMACH 09/01/2009  . NAUSEA 08/03/2009  . FLATULENCE-GAS-BLOATING 08/03/2009  . ABDOMINAL PAIN RIGHT UPPER QUADRANT 07/06/2009  . SUPRAPUBIC PAIN  07/06/2009  . GANGLION CYST, HX OF 07/06/2009  . BACK PAIN 05/04/2009  . Hyperlipidemia 09/16/2008  . UNSPECIFIED MYALGIA AND MYOSITIS 09/16/2008  . Precordial pain 07/27/2008  . GERD 06/08/2008  . HELICOBACTER PYLORI GASTRITIS, HX OF 04/05/2008  . OTHER ACUTE REACTIONS TO STRESS 03/16/2008  . ABDOMINAL PAIN, EPIGASTRIC 03/16/2008  . SHOULDER PAIN, RIGHT 10/01/2007  . ELBOW PAIN, RIGHT 10/01/2007  . Pain in limb 04/14/2007  . DEPENDENT EDEMA, RIGHT LEG 04/14/2007  . SHINGLES 04/08/2007  . Asthma 03/18/2007  . GANGLION CYST, WRIST, RIGHT 03/18/2007  . Duncan Dull OF 03/18/2007    Jule Ser, PT 04/23/2019, 9:39 AM  [facility] [address], [place], [place], [address] Phone: [phone]   Fax:  [phone]  Name: Emily Osborne MRN: [id number] Date of Birth: [birth year]

## 2019-04-23 NOTE — Patient Instructions (Signed)
Access Code: [email or web address]  URL: [email or web address]  Date: 04/23/2019  Prepared by: Jari Favre   Exercises  Supine Diaphragmatic Breathing with Pelvic Floor Lengthening - 10 reps - 1 sets - 3x daily - 7x weekly  Supine Lower Trunk Rotation - 10 reps - 3 sets - 1x daily - 7x weekly  Supine Hamstring Stretch with Strap - 3 reps - 1 sets - 30 sec hold - 1x daily - 7x weekly  Sidelying Clamshell with Pelvic Floor Contraction - 10 reps - 3 sets - 1x daily - 7x weekly  Ball squeeze with Kegel - 10 reps - 1 sets - 3 sec hold - 1x daily - 7x weekly  Supine Bridge with Pelvic Floor Contraction - 10 reps - 1 sets - 1x daily - 7x weekly

## 2019-04-29 ENCOUNTER — Other Ambulatory Visit: Payer: Self-pay

## 2019-04-29 ENCOUNTER — Ambulatory Visit: Payer: 59 | Admitting: Physical Therapy

## 2019-04-29 ENCOUNTER — Encounter: Payer: Self-pay | Admitting: Physical Therapy

## 2019-04-29 DIAGNOSIS — M25551 Pain in right hip: Secondary | ICD-10-CM | POA: Diagnosis not present

## 2019-04-29 DIAGNOSIS — R252 Cramp and spasm: Secondary | ICD-10-CM | POA: Diagnosis not present

## 2019-04-29 DIAGNOSIS — M6281 Muscle weakness (generalized): Secondary | ICD-10-CM

## 2019-04-29 NOTE — Therapy (Signed)
[facility] [address] [place], [place], [address] Phone: [phone]   Fax:  [phone]  Physical Therapy Treatment  Patient Details  Name: Emily Osborne MRN: [id number] Date of Birth: 1961-07-30 Referring Provider (PT): Armbruster, Carlota Raspberry, MD   Encounter Date: 04/29/2019  PT End of Session - 04/29/19 0903    Visit Number  3    Date for PT Re-Evaluation  06/04/19    PT Start Time  [id number]   arrived late   PT Stop Time  0927    PT Time Calculation (min)  34 min    Activity Tolerance  Patient tolerated treatment well    Behavior During Therapy  [facility] for tasks assessed/performed       Past Medical History:  Diagnosis Date  . Allergy   . Anxiety   . Asthma    pt has inhaler  . Back pain   . C. difficile colitis   . Cataract    bil eyes  . Chest pain   . Chronic fatigue syndrome   . Clostridium difficile colitis 12/13/2016  . Diverticulitis   . Dyspnea   . Dysrhythmia    palpitations  . Edema   . Elevated blood pressure reading without diagnosis of hypertension    "just elevated when I'm in pain" (12/07/2015)  . Fatty liver   . Fibromyalgia   . Ganglion of joint    right wrist  . GERD (gastroesophageal reflux disease)   . H. pylori infection   . Headache    hx of  . Helicobacter pylori (H. pylori) infection 12/13/2016  . Herpes zoster without mention of complication   . HTN (hypertension)   . Hyperlipidemia   . Hyperthyroidism   . IBS (irritable bowel syndrome)   . Leg edema   . Multiple drug allergies 06/18/2017  . Myalgia   . Nontraumatic rupture of Achilles tendon   . Other acute reactions to stress   . Pain in joint, shoulder region   . Pain in joint, upper arm   . Pain in limb   . Palpitations   . Personal history of other diseases of digestive system   . Pneumonia    once  . PONV (postoperative nausea and vomiting)   . Routine screening for STI (sexually transmitted infection)  12/13/2016  . S/P partial colectomy 06/18/2017  . Thyroid disease    hyperthyroidism  . Type 2 diabetes mellitus with hyperglycemia, without long-term current use of insulin ([place]) 08/07/2018  . Vitamin B 12 deficiency     Past Surgical History:  Procedure Laterality Date  . ABDOMINAL HYSTERECTOMY    . ACHILLES TENDON REPAIR Right 2005  . BUNIONECTOMY Bilateral    Bunionectomy 1983  . COLON SURGERY  01/23/2017   6 to 8 inches sigmoid colon removed  . COLONOSCOPY    . GANGLION CYST EXCISION Right    wrist  . LEFT HEART CATH AND CORONARY ANGIOGRAPHY N/A 10/03/2016   Procedure: Left Heart Cath and Coronary Angiography;  Surgeon: Burnell Blanks, MD;  Location: [place];  Service: Cardiovascular;  Laterality: N/A;  . MENISCUS REPAIR Right 04/2014  . POLYPECTOMY    . TUBAL LIGATION    . wisdomteeth extraction      There were no vitals filed for this visit.  Subjective Assessment - 04/29/19 0855    Subjective  I had leakage at work on Saturday and Monday.  Saturday was the worst, I'm not sure what  happened.  Pt states she had urinary leakage at home as well    Pertinent History  sigmoid colon removed about 1 year, myalgia    Patient Stated Goals  Stop having leakage    Currently in Pain?  No/denies                       [facility] Adult PT Treatment/Exercise - 04/29/19 0001      Neuro Re-ed    Neuro Re-ed Details   used biofeedback for response to exercises and stretches      Lumbar Exercises: Stretches   Single Knee to Chest Stretch  Right;Left;3 reps;20 seconds    Piriformis Stretch  Right;Left;3 reps;20 seconds      Lumbar Exercises: Supine   Ab Set  10 reps;2 seconds   kegel   Bridge  10 reps;3 seconds      Manual Therapy   Manual Therapy  Myofascial release    Myofascial Release  bilateral glutes and sacral distraction             PT Education - 04/29/19 0934    Education Details  Access Code: [email or web address]    Person(s) Educated   Patient    Methods  Explanation;Demonstration;Tactile cues;Verbal cues;Handout    Comprehension  Verbalized understanding;Returned demonstration       PT Short Term Goals - 04/23/19 0935      PT SHORT TERM GOAL #1   Title  pt will report leakage reduced to 4-5 days/ week    Baseline  2x this last week    Period  Weeks    Status  Achieved      PT SHORT TERM GOAL #2   Title  pt will report she is able to use the bathroom only 2x in the morning    Status  On-going        PT Long Term Goals - 04/09/19 1044      PT LONG TERM GOAL #1   Title  pt will be ind with HEP in order to maintain muscle strength for bowel control    Time  8    Period  Weeks    Status  New    Target Date  06/04/19      PT LONG TERM GOAL #2   Title  pt will report leakage at most 1 day/week    Time  8    Period  Weeks    Status  New    Target Date  06/04/19      PT LONG TERM GOAL #3   Title  Pt will report Rt hip pain reduced by 50%    Time  8    Period  Weeks    Status  New    Target Date  06/04/19      PT LONG TERM GOAL #4   Title  Pt will report she is able to feel the urge to have a BM with 100% accuracy    Time  8    Period  Weeks    Status  New    Target Date  06/04/19            Plan - 04/29/19 0935    Clinical Impression Statement  Pt had some difficulty relaxing after contracting the pelvic floor.  She needed cues to relax and helped with biofeedback for visual cues.  Pt reports not being consistent with exercises and mostly doing them at work whenever she thinks about it.  Pt was educated on importance of stretching and relaxing every day in the morning.  pt will benefi tfrom skilled PT to continue working on improved muscle coordination.    Comorbidities  2 vaginal deliveries close together, history of pelvic pain chronic, s/p removal of sigmoid    PT Treatment/Interventions  ADLs/Self Care Home Management;Biofeedback;Cryotherapy;Electrical Stimulation;Iontophoresis 4mg /ml  Dexamethasone;Moist Heat;Therapeutic activities;Therapeutic exercise;Manual techniques;Taping;Dry needling;Patient/family education;Neuromuscular re-education    PT Next Visit Plan  continue strength and LE and hip stretches, biofeedback    PT Home Exercise Plan  Access Code: [email or web address]    Consulted and Agree with Plan of Care  Patient       Patient will benefit from skilled therapeutic intervention in order to improve the following deficits and impairments:  Pain, Increased muscle spasms, Decreased strength, Impaired tone, Postural dysfunction, Impaired flexibility  Visit Diagnosis: Muscle weakness (generalized)  Cramp and spasm  Pain in right hip     Problem List Patient Active Problem List   Diagnosis Date Noted  . Upper airway cough syndrome vs cough variant asthma 01/01/2019  . Chest pain 12/09/2018  . Prediabetes 10/07/2018  . Preventative health care 08/07/2018  . Anxiety 10/11/2017  . S/P partial colectomy 06/18/2017  . Multiple drug allergies 06/18/2017  . Colitis 02/05/2017  . Diarrhea 02/05/2017  . Diverticular disease 01/23/2017  . Helicobacter pylori (H. pylori) infection 12/13/2016  . Routine screening for STI (sexually transmitted infection) 12/13/2016  . Clostridium difficile colitis 12/13/2016  . Diverticulitis 10/10/2016  . Lower abdominal pain 09/29/2016  . Palpitations 02/06/2016  . Mild diastolic dysfunction [id number]  . Graves disease 01/02/2016  . Acute diverticulitis 12/07/2015  . Acute bacterial sinusitis 10/04/2015  . History of colonic polyps 11/15/2014  . Left shoulder pain 09/28/2013  . Left-sided weakness 09/16/2013  . Obesity (BMI 30-39.9) 09/02/2013  . Myalgia 06/21/2011  . POSTMENOPAUSAL STATUS 09/07/2010  . PAIN IN JOINT, MULTIPLE SITES 12/27/2009  . LOW BACK PAIN, CHRONIC 12/27/2009  . FATIGUE 12/27/2009  . VAGINITIS 11/22/2009  . DYSPEPSIA&OTHER [facility] DISORDERS FUNCTION STOMACH 09/01/2009  . NAUSEA 08/03/2009  .  FLATULENCE-GAS-BLOATING 08/03/2009  . ABDOMINAL PAIN RIGHT UPPER QUADRANT 07/06/2009  . SUPRAPUBIC PAIN 07/06/2009  . GANGLION CYST, HX OF 07/06/2009  . BACK PAIN 05/04/2009  . Hyperlipidemia 09/16/2008  . UNSPECIFIED MYALGIA AND MYOSITIS 09/16/2008  . Precordial pain 07/27/2008  . GERD 06/08/2008  . HELICOBACTER PYLORI GASTRITIS, HX OF 04/05/2008  . OTHER ACUTE REACTIONS TO STRESS 03/16/2008  . ABDOMINAL PAIN, EPIGASTRIC 03/16/2008  . SHOULDER PAIN, RIGHT 10/01/2007  . ELBOW PAIN, RIGHT 10/01/2007  . Pain in limb 04/14/2007  . DEPENDENT EDEMA, RIGHT LEG 04/14/2007  . SHINGLES 04/08/2007  . Asthma 03/18/2007  . GANGLION CYST, WRIST, RIGHT 03/18/2007  . Duncan Dull OF 03/18/2007    Jule Ser, PT 04/29/2019, 9:40 AM  [facility] [address], [place], [place], [address] Phone: [phone]   Fax:  [phone]  Name: Emily Osborne MRN: [id number] Date of Birth: May 15, 1961

## 2019-04-29 NOTE — Patient Instructions (Signed)
Access Code: [email or web address]  URL: [email or web address]  Date: 04/29/2019  Prepared by: Jari Favre   Exercises  Supine Diaphragmatic Breathing with Pelvic Floor Lengthening - 10 reps - 1 sets - 3x daily - 7x weekly  Supine Lower Trunk Rotation - 10 reps - 3 sets - 1x daily - 7x weekly  Supine Hamstring Stretch with Strap - 3 reps - 1 sets - 30 sec hold - 1x daily - 7x weekly  Sidelying Clamshell with Pelvic Floor Contraction - 10 reps - 3 sets - 1x daily - 7x weekly  Ball squeeze with Kegel - 10 reps - 1 sets - 3 sec hold - 1x daily - 7x weekly  Supine Bridge with Pelvic Floor Contraction - 10 reps - 1 sets - 1x daily - 7x weekly  Supine Figure 4 Piriformis Stretch - 3 reps - 1 sets - 30 sec hold - 1x daily - 7x weekly

## 2019-05-01 ENCOUNTER — Ambulatory Visit: Payer: 59 | Admitting: Physical Therapy

## 2019-05-01 ENCOUNTER — Telehealth: Payer: Self-pay | Admitting: Internal Medicine

## 2019-05-04 ENCOUNTER — Ambulatory Visit (INDEPENDENT_AMBULATORY_CARE_PROVIDER_SITE_OTHER): Payer: 59 | Admitting: Family Medicine

## 2019-05-05 ENCOUNTER — Ambulatory Visit (INDEPENDENT_AMBULATORY_CARE_PROVIDER_SITE_OTHER): Payer: 59 | Admitting: Family Medicine

## 2019-05-05 ENCOUNTER — Other Ambulatory Visit: Payer: Self-pay

## 2019-05-05 VITALS — BP 118/77 | HR 59 | Temp 98.2°F | Ht 65.0 in | Wt 232.0 lb

## 2019-05-05 DIAGNOSIS — R7303 Prediabetes: Secondary | ICD-10-CM

## 2019-05-05 DIAGNOSIS — Z9189 Other specified personal risk factors, not elsewhere classified: Secondary | ICD-10-CM | POA: Diagnosis not present

## 2019-05-05 DIAGNOSIS — Z6838 Body mass index (BMI) 38.0-38.9, adult: Secondary | ICD-10-CM | POA: Diagnosis not present

## 2019-05-05 DIAGNOSIS — E559 Vitamin D deficiency, unspecified: Secondary | ICD-10-CM

## 2019-05-05 MED ORDER — VITAMIN D (ERGOCALCIFEROL) 1.25 MG (50000 UNIT) PO CAPS: 50000.0000 [IU] | ORAL_CAPSULE | ORAL | 0 refills | Status: DC

## 2019-05-05 MED ORDER — METFORMIN HCL 500 MG PO TABS: 500.0000 mg | ORAL_TABLET | Freq: Every day | ORAL | 0 refills | Status: DC

## 2019-05-05 MED FILL — VIT D2 1.25 MG (50,000 UNIT: 1.25 MG | 28 days supply | Qty: 4 | Fill #0

## 2019-05-05 MED FILL — metFORMIN HCL 500 MG TABS: 500 | 30 days supply | Qty: 30 | Fill #0

## 2019-05-06 ENCOUNTER — Encounter: Payer: Self-pay | Admitting: Physical Therapy

## 2019-05-06 ENCOUNTER — Ambulatory Visit: Payer: 59 | Admitting: Physical Therapy

## 2019-05-06 DIAGNOSIS — M6281 Muscle weakness (generalized): Secondary | ICD-10-CM

## 2019-05-06 DIAGNOSIS — R252 Cramp and spasm: Secondary | ICD-10-CM

## 2019-05-06 DIAGNOSIS — M25551 Pain in right hip: Secondary | ICD-10-CM

## 2019-05-06 NOTE — Progress Notes (Signed)
Office: [phone]  /  Fax: [phone]   HPI:   Chief Complaint: OBESITY Emily Osborne is here to discuss her progress with her obesity treatment plan. She is on the Category 2 plan or follow the Pescatarian eating plan and is following her eating plan approximately 20 % of the time. She states she is exercising 0 minutes 0 times per week. [name] voices she cam today to leave the program. She has had a very stressful last 4 months and has struggled to stay motivated and follow any plan. She feels she can't stay committed secondary to lack of initiative.  Her weight is 232 lb (105.2 kg) today and has gained 6 lbs since her last visit. She has lost 0 lbs since starting treatment with [place].  Vitamin D Deficiency Emily Osborne has a diagnosis of vitamin D deficiency. She is currently taking prescription Vit D. She notes fatigue and denies nausea, vomiting or muscle weakness.  At risk for osteopenia and osteoporosis Emily Osborne is at higher risk of osteopenia and osteoporosis due to vitamin D deficiency.   Pre-Diabetes Emily Osborne has a diagnosis of pre-diabetes based on her elevated Hgb A1c and was informed this puts her at greater risk of developing diabetes. Last Hgb A1c was of 6.0 on 12/11/2018. She denies GI side effects of metformin and continues to work on diet and exercise to decrease risk of diabetes. She denies hypoglycemia.  ASSESSMENT AND PLAN:  Vitamin D deficiency - Plan: Vitamin D, Ergocalciferol, (DRISDOL) 1.25 MG (50000 UT) CAPS capsule  Prediabetes - Plan: metFORMIN (GLUCOPHAGE) 500 MG tablet  At risk for osteoporosis  Class 2 severe obesity with serious comorbidity and body mass index (BMI) of 38.0 to 38.9 in adult, unspecified obesity type ([place])  PLAN:  Vitamin D Deficiency Emily Osborne was informed that low vitamin D levels contributes to fatigue and are associated with obesity, breast, and colon cancer. Emily Osborne agrees to continue taking prescription Vit D 50,000 IU every week #4 and we will  refill for 1 month. She will follow up for routine testing of vitamin D, at least 2-3 times per year. She was informed of the risk of over-replacement of vitamin D and agrees to not increase her dose unless she discusses this with [place] first. Emily Osborne agrees to follow up with our clinic in 2 weeks.  At risk for osteopenia and osteoporosis Emily Osborne was given extended (15 minutes) osteoporosis prevention counseling today. Emily Osborne is at risk for osteopenia and osteoporsis due to her vitamin D deficiency. She was encouraged to take her vitamin D and follow her higher calcium diet and increase strengthening exercise to help strengthen her bones and decrease her risk of osteopenia and osteoporosis.  Pre-Diabetes Emily Osborne will continue to work on weight loss, exercise, and decreasing simple carbohydrates in her diet to help decrease the risk of diabetes. We dicussed metformin including benefits and risks. She was informed that eating too many simple carbohydrates or too many calories at one sitting increases the likelihood of GI side effects. Emily Osborne agrees to continue taking metformin 500 mg PO q AM #30 and we will refill for 1 month. Emily Osborne agrees to follow up with our clinic in 2 weeks as directed to monitor her progress.  Obesity Emily Osborne is currently in the action stage of change. As such, her goal is to continue with weight loss efforts She has agreed to follow the Category 2 plan with breakfast and lunch, and with protein for late night snack Emily Osborne has been instructed to work up to a goal  of 150 minutes of combined cardio and strengthening exercise per week for weight loss and overall health benefits. We discussed the following Behavioral Modification Strategies today: increasing lean protein intake, increasing vegetables and work on meal planning and easy cooking plans, keeping healthy foods in the home, and planning for success   [name] has agreed to follow up with our clinic in 2 weeks. She was informed of  the importance of frequent follow up visits to maximize her success with intensive lifestyle modifications for her multiple health conditions.  ALLERGIES: Allergies  Allergen Reactions  . Aspirin Other (See Comments)    REACTION: anaphylaxis  . Gentamicin Other (See Comments)    Eye drops turned the sclera bright red  . Ibuprofen Other (See Comments)    REACTION: anaphylaxsis  . Metronidazole Swelling    REACTION: red face/swelling  . Nsaids Other (See Comments)    REACTION: anaphylaxis  . Doxycycline Other (See Comments)    REACTION: severe nausea/vomiting  . Influenza A (H1n1) [place] Vac Other (See Comments)    REACTION: sick for 3 weeks  . Loratadine Other (See Comments)    Fatigue/weakness  . Periactin [Cyproheptadine] Other (See Comments)    paranoid   . Ranitidine Hcl Other (See Comments)    REACTION: Lips turned red /peel  . Sulfamethoxazole-Trimethoprim Other (See Comments)    REACTION: face red/peel  . Tramadol Hcl Other (See Comments)    REACTION: paranoid    MEDICATIONS: Current Outpatient Medications on File Prior to Visit  Medication Sig Dispense Refill  . acetaminophen (TYLENOL) 500 MG tablet Take 1,000 mg by mouth every 6 (six) hours as needed for moderate pain.     Marland Kitchen albuterol (PROAIR HFA) 108 (90 Base) MCG/ACT inhaler USE 2 PUFFS EVERY 4 HOURS AS NEEDED FOR COUGH, WHEEZE OR SHORTNESS OF BREATH. 8.5 each 2  . ALPRAZolam (XANAX) 0.25 MG tablet Take 1 tablet (0.25 mg total) by mouth 3 (three) times daily as needed. 30 tablet 2  . atenolol (TENORMIN) 25 MG tablet TAKE 1/2 TABLETS (12.5 MG TOTAL) BY MOUTH DAILY 45 tablet 3  . azelastine (OPTIVAR) 0.05 % ophthalmic solution Place 1 drop into both eyes 2 (two) times daily as needed.    . Blood Glucose Monitoring Suppl (FREESTYLE FREEDOM) KIT 1 Device by Does not apply route daily. 1 each 0  . budesonide (PULMICORT) 180 MCG/ACT inhaler Inhale into the lungs 2 (two) times daily.    . budesonide-formoterol  (SYMBICORT) 80-4.5 MCG/ACT inhaler Take 2 puffs first thing in am and then another 2 puffs about 12 hours later. 1 Inhaler 11  . dexlansoprazole (DEXILANT) 60 MG capsule Take 1 capsule (60 mg total) by mouth daily. 90 capsule 0  . diclofenac sodium (VOLTAREN) 1 % GEL Apply 2 g topically 4 (four) times daily. Rub into affected area of foot 2 to 4 times daily 100 g 2  . famotidine (PEPCID) 20 MG tablet One after supper 30 tablet 11  . fexofenadine (ALLEGRA) 180 MG tablet Take 1 tablet (180 mg total) by mouth daily. 90 tablet 3  . fluticasone (FLONASE) 50 MCG/ACT nasal spray PLACE 2 SPRAYS INTO BOTH NOSTRILS DAILY. 16 g 4  . glucose blood (FREESTYLE LITE) test strip USE TO CHECK BLOOD SUGAR ONCE A DAY 100 each 1  . Iron Combinations (I.L.X. B-12) ELIX Take 1 each by mouth daily as needed (energy). 1 droplet full    . lidocaine (LIDODERM) 5 % Apply to affected area QD PRN for a Week 30 patch  0  . rosuvastatin (CRESTOR) 10 MG tablet Take 1 tablet (10 mg total) by mouth daily. 90 tablet 3  . valACYclovir (VALTREX) 1000 MG tablet Take 1,000 mg by mouth 3 (three) times daily as needed.     No current facility-administered medications on file prior to visit.     PAST MEDICAL HISTORY: Past Medical History:  Diagnosis Date  . Allergy   . Anxiety   . Asthma    pt has inhaler  . Back pain   . C. difficile colitis   . Cataract    bil eyes  . Chest pain   . Chronic fatigue syndrome   . Clostridium difficile colitis 12/13/2016  . Diverticulitis   . Dyspnea   . Dysrhythmia    palpitations  . Edema   . Elevated blood pressure reading without diagnosis of hypertension    "just elevated when I'm in pain" (12/07/2015)  . Fatty liver   . Fibromyalgia   . Ganglion of joint    right wrist  . GERD (gastroesophageal reflux disease)   . H. pylori infection   . Headache    hx of  . Helicobacter pylori (H. pylori) infection 12/13/2016  . Herpes zoster without mention of complication   . HTN  (hypertension)   . Hyperlipidemia   . Hyperthyroidism   . IBS (irritable bowel syndrome)   . Leg edema   . Multiple drug allergies 06/18/2017  . Myalgia   . Nontraumatic rupture of Achilles tendon   . Other acute reactions to stress   . Pain in joint, shoulder region   . Pain in joint, upper arm   . Pain in limb   . Palpitations   . Personal history of other diseases of digestive system   . Pneumonia    once  . PONV (postoperative nausea and vomiting)   . Routine screening for STI (sexually transmitted infection) 12/13/2016  . S/P partial colectomy 06/18/2017  . Thyroid disease    hyperthyroidism  . Type 2 diabetes mellitus with hyperglycemia, without long-term current use of insulin ([place]) 08/07/2018  . Vitamin B 12 deficiency     PAST SURGICAL HISTORY: Past Surgical History:  Procedure Laterality Date  . ABDOMINAL HYSTERECTOMY    . ACHILLES TENDON REPAIR Right 2005  . BUNIONECTOMY Bilateral    Bunionectomy 1983  . COLON SURGERY  01/23/2017   6 to 8 inches sigmoid colon removed  . COLONOSCOPY    . GANGLION CYST EXCISION Right    wrist  . LEFT HEART CATH AND CORONARY ANGIOGRAPHY N/A 10/03/2016   Procedure: Left Heart Cath and Coronary Angiography;  Surgeon: Burnell Blanks, MD;  Location: [facility];  Service: Cardiovascular;  Laterality: N/A;  . MENISCUS REPAIR Right 04/2014  . POLYPECTOMY    . TUBAL LIGATION    . wisdomteeth extraction      SOCIAL HISTORY: Social History   Tobacco Use  . Smoking status: Never Smoker  . Smokeless tobacco: Never Used  Substance Use Topics  . Alcohol use: No    Alcohol/week: 0.0 standard drinks  . Drug use: No    FAMILY HISTORY: Family History  Problem Relation Age of Onset  . Hypertension Father   . Diabetes Father   . Heart disease Father   . Kidney disease Father        Died, 1  . Hyperlipidemia Father   . Leukemia Brother   . Cancer Brother        myleoblastic anemia  .  Hyperlipidemia Mother   .  Hypertension Mother        Died, 4  . Thyroid disease Mother        Thyroid surgery  . Obesity Mother   . Heart disease Mother   . Pernicious anemia Sister   . Thyroid disease Sister        On thyroid Rx  . Lupus Daughter   . Coronary artery disease Brother   . Hypertension Brother   . Colon cancer Neg Hx   . Esophageal cancer Neg Hx   . Rectal cancer Neg Hx   . Stomach cancer Neg Hx   . Pancreatic cancer Neg Hx   . Prostate cancer Neg Hx   . Colon polyps Neg Hx     ROS: Review of Systems  Constitutional: Positive for malaise/fatigue. Negative for weight loss.  Gastrointestinal: Negative for nausea and vomiting.  Musculoskeletal:       Negative muscle weakness  Endo/Heme/Allergies:       Negative hypoglycemia    PHYSICAL EXAM: Blood pressure 118/77, pulse (!) 59, temperature 98.2 F (36.8 C), temperature source Oral, height 5' 5"  (1.651 m), weight 232 lb (105.2 kg), SpO2 98 %. Body mass index is 38.61 kg/m. Physical Exam Vitals signs reviewed.  Constitutional:      Appearance: Normal appearance. She is obese.  Cardiovascular:     Rate and Rhythm: Normal rate.     Pulses: Normal pulses.  Pulmonary:     Effort: Pulmonary effort is normal.     Breath sounds: Normal breath sounds.  Skin:    General: Skin is warm and dry.  Neurological:     Mental Status: She is alert and oriented to person, place, and time.  Psychiatric:        Mood and Affect: Mood normal.        Behavior: Behavior normal.     RECENT LABS AND TESTS: BMET    Component Value Date/Time   NA 138 12/11/2018 0915   NA 142 03/20/2018 1046   K 4.2 12/11/2018 0915   CL 105 12/11/2018 0915   CO2 26 12/11/2018 0915   GLUCOSE 103 (H) 12/11/2018 0915   BUN 18 12/11/2018 0915   BUN 17 03/20/2018 1046   CREATININE 0.85 12/11/2018 0915   CREATININE 0.97 03/23/2016 1501   CALCIUM 9.0 12/11/2018 0915   GFRNONAA >60 11/27/2018 1144   GFRAA >60 11/27/2018 1144   Lab Results  Component Value Date    HGBA1C 6.0 12/11/2018   HGBA1C 5.8 08/07/2018   HGBA1C 6.0 05/13/2018   HGBA1C 5.6 03/20/2018   HGBA1C 5.8 (H) 12/12/2017   Lab Results  Component Value Date   INSULIN 20.0 03/20/2018   INSULIN 17.6 12/12/2017   CBC    Component Value Date/Time   WBC 5.2 11/27/2018 1144   RBC 4.70 11/27/2018 1144   HGB 13.5 11/27/2018 1144   HGB 13.5 12/12/2017 1102   HCT 41.6 11/27/2018 1144   HCT 41.8 12/12/2017 1102   PLT 253 11/27/2018 1144   MCV 88.5 11/27/2018 1144   MCV 89 12/12/2017 1102   MCH 28.7 11/27/2018 1144   MCHC 32.5 11/27/2018 1144   RDW 14.9 11/27/2018 1144   RDW 15.1 12/12/2017 1102   LYMPHSABS 2.3 08/07/2018 1208   LYMPHSABS 2.3 12/12/2017 1102   MONOABS 0.5 08/07/2018 1208   EOSABS 0.1 08/07/2018 1208   EOSABS 0.1 12/12/2017 1102   BASOSABS 0.0 08/07/2018 1208   BASOSABS 0.0 12/12/2017 1102   Iron/TIBC/Ferritin/ %Sat  No results found for: IRON, TIBC, FERRITIN, IRONPCTSAT Lipid Panel     Component Value Date/Time   CHOL 203 (H) 12/11/2018 0915   CHOL 197 03/20/2018 1046   TRIG 162.0 (H) 12/11/2018 0915   HDL 50.30 12/11/2018 0915   HDL 57 03/20/2018 1046   CHOLHDL 4 12/11/2018 0915   VLDL 32.4 12/11/2018 0915   LDLCALC 120 (H) 12/11/2018 0915   LDLCALC 124 (H) 03/20/2018 1046   LDLDIRECT 139.4 12/26/2011 0905   Hepatic Function Panel     Component Value Date/Time   PROT 6.5 12/11/2018 0915   PROT 6.8 03/20/2018 1046   ALBUMIN 4.0 12/11/2018 0915   ALBUMIN 4.4 03/20/2018 1046   AST 24 12/11/2018 0915   ALT 32 12/11/2018 0915   ALKPHOS 59 12/11/2018 0915   BILITOT 0.5 12/11/2018 0915   BILITOT 0.4 03/20/2018 1046   BILIDIR 0.1 09/07/2014 1031      Component Value Date/Time   TSH 0.87 10/07/2018 0953   TSH 0.44 08/07/2018 1208   TSH 0.67 06/05/2018 1638      OBESITY BEHAVIORAL INTERVENTION VISIT  Today's visit was # 16   Starting weight: 226 lbs Starting date: 12/12/17 Today's weight : 232 lbs  Today's date: 05/05/2019 Total lbs lost  to date: 0    ASK: We discussed the diagnosis of obesity with Rudene Christians today and Dazha agreed to give [place] permission to discuss obesity behavioral modification therapy today.  ASSESS: Anhar has the diagnosis of obesity and her BMI today is 38.61 Merelin is in the action stage of change   ADVISE: Hailei was educated on the multiple health risks of obesity as well as the benefit of weight loss to improve her health. She was advised of the need for long term treatment and the importance of lifestyle modifications to improve her current health and to decrease her risk of future health problems.  AGREE: Multiple dietary modification options and treatment options were discussed and  Grainne agreed to follow the recommendations documented in the above note.  ARRANGE: Mireya was educated on the importance of frequent visits to treat obesity as outlined per CMS and USPSTF guidelines and agreed to schedule her next follow up appointment today.  I, Trixie Dredge, am acting as transcriptionist for Ilene Qua, MD  I have reviewed the above documentation for accuracy and completeness, and I agree with the above. - Ilene Qua, MD

## 2019-05-06 NOTE — Therapy (Signed)
[facility] [address] [place], [place], [address] Phone: [phone]   Fax:  [phone]  Physical Therapy Treatment  Patient Details  Name: Emily Osborne MRN: [id number] Date of Birth: [birth year] Referring Provider (PT): Armbruster, Carlota Raspberry, MD   Encounter Date: 05/06/2019  PT End of Session - 05/06/19 0857    Visit Number  4    Date for PT Re-Evaluation  06/04/19    PT Start Time  0851    PT Stop Time  0930    PT Time Calculation (min)  39 min    Activity Tolerance  Patient tolerated treatment well    Behavior During Therapy  [facility] for tasks assessed/performed       Past Medical History:  Diagnosis Date  . Allergy   . Anxiety   . Asthma    pt has inhaler  . Back pain   . C. difficile colitis   . Cataract    bil eyes  . Chest pain   . Chronic fatigue syndrome   . Clostridium difficile colitis 12/13/2016  . Diverticulitis   . Dyspnea   . Dysrhythmia    palpitations  . Edema   . Elevated blood pressure reading without diagnosis of hypertension    "just elevated when I'm in pain" (12/07/2015)  . Fatty liver   . Fibromyalgia   . Ganglion of joint    right wrist  . GERD (gastroesophageal reflux disease)   . H. pylori infection   . Headache    hx of  . Helicobacter pylori (H. pylori) infection 12/13/2016  . Herpes zoster without mention of complication   . HTN (hypertension)   . Hyperlipidemia   . Hyperthyroidism   . IBS (irritable bowel syndrome)   . Leg edema   . Multiple drug allergies 06/18/2017  . Myalgia   . Nontraumatic rupture of Achilles tendon   . Other acute reactions to stress   . Pain in joint, shoulder region   . Pain in joint, upper arm   . Pain in limb   . Palpitations   . Personal history of other diseases of digestive system   . Pneumonia    once  . PONV (postoperative nausea and vomiting)   . Routine screening for STI (sexually transmitted infection) 12/13/2016  . S/P  partial colectomy 06/18/2017  . Thyroid disease    hyperthyroidism  . Type 2 diabetes mellitus with hyperglycemia, without long-term current use of insulin ([place]) 08/07/2018  . Vitamin B 12 deficiency     Past Surgical History:  Procedure Laterality Date  . ABDOMINAL HYSTERECTOMY    . ACHILLES TENDON REPAIR Right 2005  . BUNIONECTOMY Bilateral    Bunionectomy 1983  . COLON SURGERY  01/23/2017   6 to 8 inches sigmoid colon removed  . COLONOSCOPY    . GANGLION CYST EXCISION Right    wrist  . LEFT HEART CATH AND CORONARY ANGIOGRAPHY N/A 10/03/2016   Procedure: Left Heart Cath and Coronary Angiography;  Surgeon: Burnell Blanks, MD;  Location: [place] [facility];  Service: Cardiovascular;  Laterality: N/A;  . MENISCUS REPAIR Right 04/2014  . POLYPECTOMY    . TUBAL LIGATION    . wisdomteeth extraction      There were no vitals filed for this visit.  Subjective Assessment - 05/06/19 0856    Subjective  I only had one day that I had some smearing, but much better than last week.  Focused on stretching and  relaxing.    Patient Stated Goals  Stop having leakage    Currently in Pain?  No/denies                       [facility] Adult PT Treatment/Exercise - 05/06/19 0001      Neuro Re-ed    Neuro Re-ed Details   breathing techniques educated andperformed with tactile cues to lift pelvic floor      Lumbar Exercises: Stretches   Active Hamstring Stretch  Right;Left;3 reps;20 seconds    Hip Flexor Stretch  Right;Left;3 reps;20 seconds    Hip Flexor Stretch Limitations  at stairs      Lumbar Exercises: Aerobic   Nustep  L2 x 6 min - seat 8   PT present to get status     Lumbar Exercises: Supine   Bridge  10 reps;3 seconds    Bridge with [employer] Limitations  ball squeeze with kegel - 10x 3 sec      Lumbar Exercises: Sidelying   Clam  Right;Left;15 reps   5 on Rt; 10 on Lt              PT Short Term Goals - 05/06/19 0902      PT SHORT TERM GOAL  #2   Title  pt will report she is able to use the bathroom only 2x in the morning    Baseline  at least 3 but is drinking coffee    Status  On-going        PT Long Term Goals - 05/06/19 0905      PT LONG TERM GOAL #1   Title  pt will be ind with HEP in order to maintain muscle strength for bowel control    Status  On-going      PT LONG TERM GOAL #2   Title  pt will report leakage at most 1 day/week    Status  On-going      PT LONG TERM GOAL #3   Title  Pt will report Rt hip pain reduced by 50%    Baseline  it feels pretty good this week    Status  On-going      PT LONG TERM GOAL #4   Title  Pt will report she is able to feel the urge to have a BM with 100% accuracy    Baseline  had one night where I felt wet and there was smearing when I got ot the bathroom    Status  On-going            Plan - 05/06/19 0925    Clinical Impression Statement  Pt needed cues for breathing and not holding breath when activating core and pelvic floor.  She was able to do when cued to breath audibly like blowing out candles.  Pt demonstrates correct lifting of pelvic floor with correct breathing technique.  She had the best result with sidelying exercise however she has some pain when doing the clam with Lt LE.  Overall, patient has improved with less urgency, less leakage, and less Lt hip pain.  Pt will benefit from skilled PT to continue working on coordination of breathing and correctly activating core and pelvic floor muscles for bowel and bladder control.    PT Treatment/Interventions  ADLs/Self Care Home Management;Biofeedback;Cryotherapy;Electrical Stimulation;Iontophoresis 4mg /ml Dexamethasone;Moist Heat;Therapeutic activities;Therapeutic exercise;Manual techniques;Taping;Dry needling;Patient/family education;Neuromuscular re-education    PT Next Visit Plan  biofeedback, continue basic core and pelvic floor strength, f/u on clamshell  doing consistently at home and correctly breathing while  engaging pelvic floor    PT Home Exercise Plan  Access Code: [email or web address]    Recommended Other Services  intitial cert signed    Consulted and Agree with Plan of Care  Patient       Patient will benefit from skilled therapeutic intervention in order to improve the following deficits and impairments:  Pain, Increased muscle spasms, Decreased strength, Impaired tone, Postural dysfunction, Impaired flexibility  Visit Diagnosis: Muscle weakness (generalized)  Cramp and spasm  Pain in right hip     Problem List Patient Active Problem List   Diagnosis Date Noted  . Upper airway cough syndrome vs cough variant asthma 01/01/2019  . Chest pain 12/09/2018  . Prediabetes 10/07/2018  . Preventative health care 08/07/2018  . Anxiety 10/11/2017  . S/P partial colectomy 06/18/2017  . Multiple drug allergies 06/18/2017  . Colitis 02/05/2017  . Diarrhea 02/05/2017  . Diverticular disease 01/23/2017  . Helicobacter pylori (H. pylori) infection 12/13/2016  . Routine screening for STI (sexually transmitted infection) 12/13/2016  . Clostridium difficile colitis 12/13/2016  . Diverticulitis 10/10/2016  . Lower abdominal pain 09/29/2016  . Palpitations 02/06/2016  . Mild diastolic dysfunction [id number]  . Graves disease 01/02/2016  . Acute diverticulitis 12/07/2015  . Acute bacterial sinusitis 10/04/2015  . History of colonic polyps 11/15/2014  . Left shoulder pain 09/28/2013  . Left-sided weakness 09/16/2013  . Obesity (BMI 30-39.9) 09/02/2013  . Myalgia 06/21/2011  . POSTMENOPAUSAL STATUS 09/07/2010  . PAIN IN JOINT, MULTIPLE SITES 12/27/2009  . LOW BACK PAIN, CHRONIC 12/27/2009  . FATIGUE 12/27/2009  . VAGINITIS 11/22/2009  . DYSPEPSIA&OTHER [facility] DISORDERS FUNCTION STOMACH 09/01/2009  . NAUSEA 08/03/2009  . FLATULENCE-GAS-BLOATING 08/03/2009  . ABDOMINAL PAIN RIGHT UPPER QUADRANT 07/06/2009  . SUPRAPUBIC PAIN 07/06/2009  . GANGLION CYST, HX OF 07/06/2009  . BACK PAIN 05/04/2009  .  Hyperlipidemia 09/16/2008  . UNSPECIFIED MYALGIA AND MYOSITIS 09/16/2008  . Precordial pain 07/27/2008  . GERD 06/08/2008  . HELICOBACTER PYLORI GASTRITIS, HX OF 04/05/2008  . OTHER ACUTE REACTIONS TO STRESS 03/16/2008  . ABDOMINAL PAIN, EPIGASTRIC 03/16/2008  . SHOULDER PAIN, RIGHT 10/01/2007  . ELBOW PAIN, RIGHT 10/01/2007  . Pain in limb 04/14/2007  . DEPENDENT EDEMA, RIGHT LEG 04/14/2007  . SHINGLES 04/08/2007  . Asthma 03/18/2007  . GANGLION CYST, WRIST, RIGHT 03/18/2007  . Duncan Dull OF 03/18/2007    Jule Ser, PT 05/06/2019, 9:50 AM  [facility] [address], [place], [place], [address] Phone: [phone]   Fax:  [phone]  Name: Emily Osborne MRN: [id number] Date of Birth: 05/11/1961

## 2019-05-08 ENCOUNTER — Other Ambulatory Visit: Payer: Self-pay

## 2019-05-08 ENCOUNTER — Encounter: Payer: Self-pay | Admitting: Physical Therapy

## 2019-05-08 ENCOUNTER — Ambulatory Visit: Payer: 59 | Admitting: Physical Therapy

## 2019-05-08 DIAGNOSIS — M25551 Pain in right hip: Secondary | ICD-10-CM

## 2019-05-08 DIAGNOSIS — M6281 Muscle weakness (generalized): Secondary | ICD-10-CM | POA: Diagnosis not present

## 2019-05-08 DIAGNOSIS — R252 Cramp and spasm: Secondary | ICD-10-CM | POA: Diagnosis not present

## 2019-05-08 NOTE — Telephone Encounter (Signed)
L/m on pt vm to call back to schedule pft -pr  °

## 2019-05-08 NOTE — Therapy (Addendum)
[facility] [address] [place], [place], [address] Phone: [phone]   Fax:  [phone]  Physical Therapy Treatment  Patient Details  Name: Emily Osborne MRN: [id number] Date of Birth: 04-Nov-1960 Referring Provider (PT): Armbruster, Carlota Raspberry, MD   Encounter Date: 05/08/2019  PT End of Session - 05/08/19 0853    Visit Number  5    Date for PT Re-Evaluation  06/04/19    PT Start Time  1941   pt arrived late   PT Stop Time  0925    PT Time Calculation (min)  32 min    Activity Tolerance  Patient tolerated treatment well    Behavior During Therapy  [facility] for tasks assessed/performed       Past Medical History:  Diagnosis Date  . Allergy   . Anxiety   . Asthma    pt has inhaler  . Back pain   . C. difficile colitis   . Cataract    bil eyes  . Chest pain   . Chronic fatigue syndrome   . Clostridium difficile colitis 12/13/2016  . Diverticulitis   . Dyspnea   . Dysrhythmia    palpitations  . Edema   . Elevated blood pressure reading without diagnosis of hypertension    "just elevated when I'm in pain" (12/07/2015)  . Fatty liver   . Fibromyalgia   . Ganglion of joint    right wrist  . GERD (gastroesophageal reflux disease)   . H. pylori infection   . Headache    hx of  . Helicobacter pylori (H. pylori) infection 12/13/2016  . Herpes zoster without mention of complication   . HTN (hypertension)   . Hyperlipidemia   . Hyperthyroidism   . IBS (irritable bowel syndrome)   . Leg edema   . Multiple drug allergies 06/18/2017  . Myalgia   . Nontraumatic rupture of Achilles tendon   . Other acute reactions to stress   . Pain in joint, shoulder region   . Pain in joint, upper arm   . Pain in limb   . Palpitations   . Personal history of other diseases of digestive system   . Pneumonia    once  . PONV (postoperative nausea and vomiting)   . Routine screening for STI (sexually transmitted infection)  12/13/2016  . S/P partial colectomy 06/18/2017  . Thyroid disease    hyperthyroidism  . Type 2 diabetes mellitus with hyperglycemia, without long-term current use of insulin ([place]) 08/07/2018  . Vitamin B 12 deficiency     Past Surgical History:  Procedure Laterality Date  . ABDOMINAL HYSTERECTOMY    . ACHILLES TENDON REPAIR Right 2005  . BUNIONECTOMY Bilateral    Bunionectomy 1983  . COLON SURGERY  01/23/2017   6 to 8 inches sigmoid colon removed  . COLONOSCOPY    . GANGLION CYST EXCISION Right    wrist  . LEFT HEART CATH AND CORONARY ANGIOGRAPHY N/A 10/03/2016   Procedure: Left Heart Cath and Coronary Angiography;  Surgeon: Burnell Blanks, MD;  Location: [place] [facility];  Service: Cardiovascular;  Laterality: N/A;  . MENISCUS REPAIR Right 04/2014  . POLYPECTOMY    . TUBAL LIGATION    . wisdomteeth extraction      There were no vitals filed for this visit.  Subjective Assessment - 05/08/19 0928    Subjective  I didn't do the clamshells, I forgot about that.    Patient Stated Goals  Stop  having leakage    Currently in Pain?  No/denies                       [facility] Adult PT Treatment/Exercise - 05/08/19 0001      Neuro Re-ed    Neuro Re-ed Details   cues to breathe while keeping core and pelvic floor muscles engaged      Lumbar Exercises: Stretches   Hip Flexor Stretch  Right;Left;3 reps;20 seconds      Lumbar Exercises: Aerobic   Nustep  L3 x 6 min - seat 8      Lumbar Exercises: Supine   Large Ball Abdominal Isometric Limitations  red ball roll out and overhead - kegel and TrA engaged - 20x      Lumbar Exercises: Sidelying   Clam  Right;Left;2 seconds;10 reps   10 x each side              PT Short Term Goals - 05/06/19 0902      PT SHORT TERM GOAL #2   Title  pt will report she is able to use the bathroom only 2x in the morning    Baseline  at least 3 but is drinking coffee    Status  On-going        PT Long Term Goals -  05/06/19 0905      PT LONG TERM GOAL #1   Title  pt will be ind with HEP in order to maintain muscle strength for bowel control    Status  On-going      PT LONG TERM GOAL #2   Title  pt will report leakage at most 1 day/week    Status  On-going      PT LONG TERM GOAL #3   Title  Pt will report Rt hip pain reduced by 50%    Baseline  it feels pretty good this week    Status  On-going      PT LONG TERM GOAL #4   Title  Pt will report she is able to feel the urge to have a BM with 100% accuracy    Baseline  had one night where I felt wet and there was smearing when I got ot the bathroom    Status  On-going            Plan - 05/08/19 0913    Clinical Impression Statement  Pt was able to increase reps of clamshell on Rt side today.  She did much better breathing and keeping core and pelvic floor engaged.  Pt arrived late so did not have as much time to progress exercises, but she was able to do a couple of exercises adding the TrA more to further the muscle acitvity.  pt will benefit from skilled PT to continue to work on strength for improved bowel and bladder control.    Comorbidities  2 vaginal deliveries close together, history of pelvic pain chronic, s/p removal of sigmoid    PT Treatment/Interventions  ADLs/Self Care Home Management;Biofeedback;Cryotherapy;Electrical Stimulation;Iontophoresis 47m/ml Dexamethasone;Moist Heat;Therapeutic activities;Therapeutic exercise;Manual techniques;Taping;Dry needling;Patient/family education;Neuromuscular re-education    PT Next Visit Plan  biofeedback, continue basic core and pelvic floor strength, f/u on clamshell doing consistently at home and correctly breathing while engaging pelvic floor, progress to kegels in standing and sitting    PT Home Exercise Plan  Access Code: [email or web address]    Consulted and Agree with Plan of Care  Patient       Patient  will benefit from skilled therapeutic intervention in order to improve the following deficits  and impairments:     Visit Diagnosis: Muscle weakness (generalized)  Cramp and spasm  Pain in right hip     Problem List Patient Active Problem List   Diagnosis Date Noted  . Upper airway cough syndrome vs cough variant asthma 01/01/2019  . Chest pain 12/09/2018  . Prediabetes 10/07/2018  . Preventative health care 08/07/2018  . Anxiety 10/11/2017  . S/P partial colectomy 06/18/2017  . Multiple drug allergies 06/18/2017  . Colitis 02/05/2017  . Diarrhea 02/05/2017  . Diverticular disease 01/23/2017  . Helicobacter pylori (H. pylori) infection 12/13/2016  . Routine screening for STI (sexually transmitted infection) 12/13/2016  . Clostridium difficile colitis 12/13/2016  . Diverticulitis 10/10/2016  . Lower abdominal pain 09/29/2016  . Palpitations 02/06/2016  . Mild diastolic dysfunction 79/43/2761  . Graves disease 01/02/2016  . Acute diverticulitis 12/07/2015  . Acute bacterial sinusitis 10/04/2015  . History of colonic polyps 11/15/2014  . Left shoulder pain 09/28/2013  . Left-sided weakness 09/16/2013  . Obesity (BMI 30-39.9) 09/02/2013  . Myalgia 06/21/2011  . POSTMENOPAUSAL STATUS 09/07/2010  . PAIN IN JOINT, MULTIPLE SITES 12/27/2009  . LOW BACK PAIN, CHRONIC 12/27/2009  . FATIGUE 12/27/2009  . VAGINITIS 11/22/2009  . DYSPEPSIA&OTHER [facility] DISORDERS FUNCTION STOMACH 09/01/2009  . NAUSEA 08/03/2009  . FLATULENCE-GAS-BLOATING 08/03/2009  . ABDOMINAL PAIN RIGHT UPPER QUADRANT 07/06/2009  . SUPRAPUBIC PAIN 07/06/2009  . GANGLION CYST, HX OF 07/06/2009  . BACK PAIN 05/04/2009  . Hyperlipidemia 09/16/2008  . UNSPECIFIED MYALGIA AND MYOSITIS 09/16/2008  . Precordial pain 07/27/2008  . GERD 06/08/2008  . HELICOBACTER PYLORI GASTRITIS, HX OF 04/05/2008  . OTHER ACUTE REACTIONS TO STRESS 03/16/2008  . ABDOMINAL PAIN, EPIGASTRIC 03/16/2008  . SHOULDER PAIN, RIGHT 10/01/2007  . ELBOW PAIN, RIGHT 10/01/2007  . Pain in limb 04/14/2007  . DEPENDENT EDEMA, RIGHT LEG  04/14/2007  . SHINGLES 04/08/2007  . Asthma 03/18/2007  . GANGLION CYST, WRIST, RIGHT 03/18/2007  . Duncan Dull OF 03/18/2007    Jule Ser, PT 05/08/2019, 9:29 AM  [facility] [address], [place], [place], [address] Phone: [phone]   Fax:  [phone]  Name: Emily Osborne MRN: [id number] Date of Birth: [birth year]  PHYSICAL THERAPY DISCHARGE SUMMARY  Visits from Start of Care: 5  Current functional level related to goals / functional outcomes: See above goals   Remaining deficits: See above   Education / Equipment: HEP  Plan: Patient agrees to discharge.  Patient goals were not met. Patient is being discharged due to not returning since the last visit.  ?????    [employer], PT 06/18/19 11:44 AM

## 2019-05-11 NOTE — Telephone Encounter (Signed)
L/m on pt vm to call back to schedule pft - 3rd attempt - closed encounter -pr  °

## 2019-05-12 MED FILL — FLUTICASONE PROP 50 MCG SPR: 50 | 30 days supply | Qty: 16 | Fill #3

## 2019-05-13 ENCOUNTER — Ambulatory Visit: Payer: 59 | Admitting: Physical Therapy

## 2019-05-15 ENCOUNTER — Encounter: Payer: 59 | Admitting: Physical Therapy

## 2019-05-20 ENCOUNTER — Ambulatory Visit: Payer: 59 | Admitting: Physical Therapy

## 2019-05-20 ENCOUNTER — Ambulatory Visit (INDEPENDENT_AMBULATORY_CARE_PROVIDER_SITE_OTHER): Payer: 59 | Admitting: Family Medicine

## 2019-05-20 ENCOUNTER — Encounter (INDEPENDENT_AMBULATORY_CARE_PROVIDER_SITE_OTHER): Payer: Self-pay

## 2019-05-22 ENCOUNTER — Ambulatory Visit: Payer: 59 | Admitting: Physical Therapy

## 2019-05-25 IMAGING — US ULTRASOUND ABDOMEN COMPLETE
1 series · 14 of 25 positions shown · non-contrast
Comparison: February 15, 2017

CLINICAL DATA: Upper abdominal pain

EXAM:
ABDOMEN ULTRASOUND COMPLETE

[Series 1: ultrasound abdomen complete · 14 of 72 slices shown]
[im 1/72]
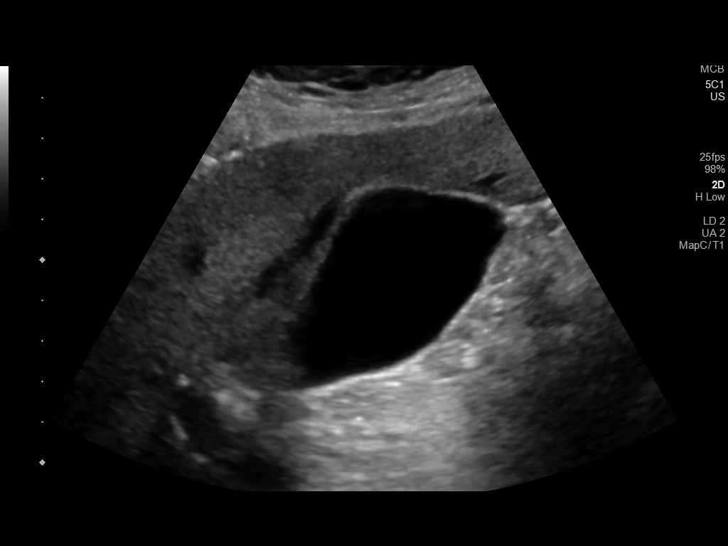
[im 6/72]
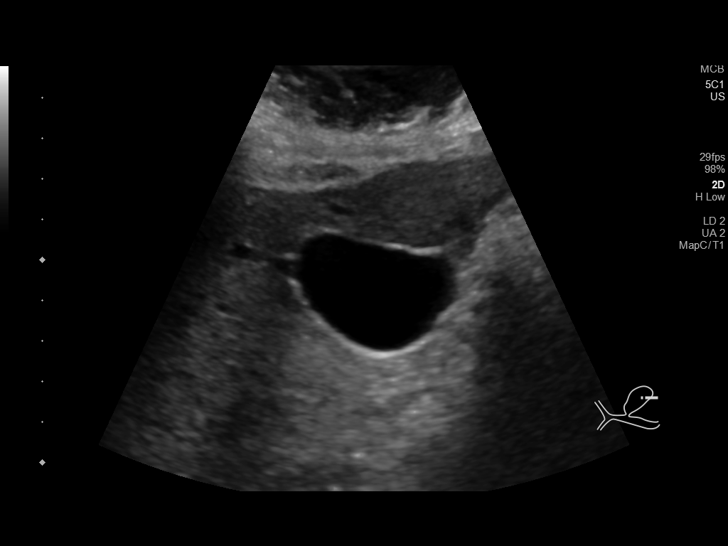
[im 12/72]
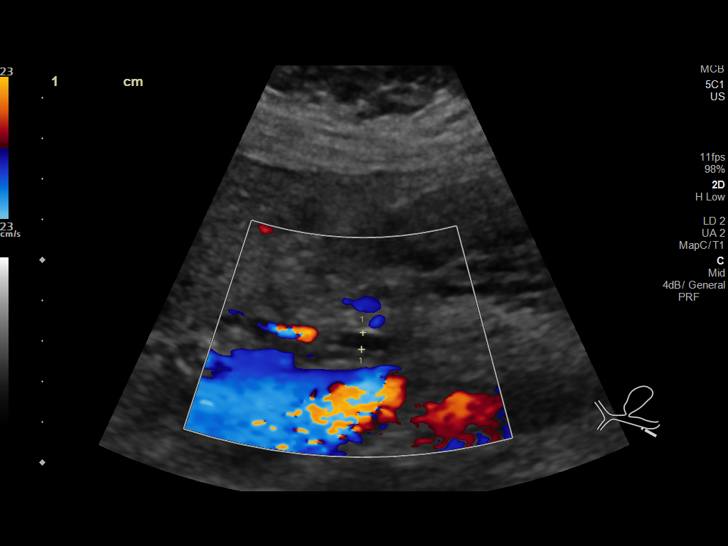
[im 18/72]
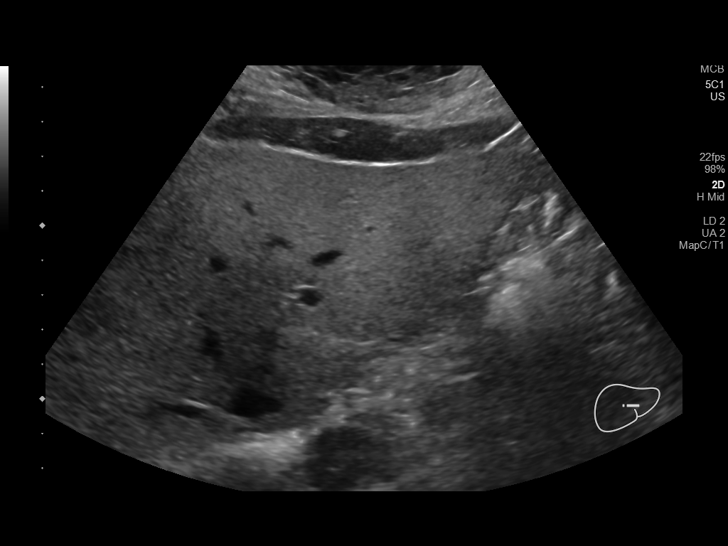
[im 24/72]
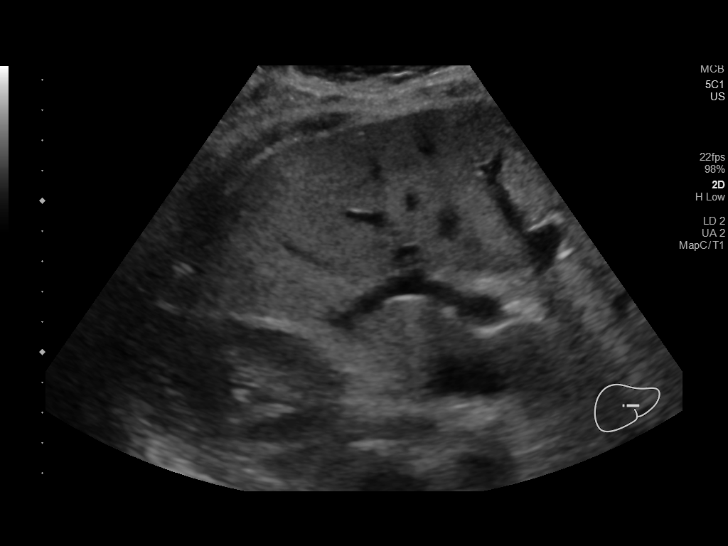
[im 27/72]
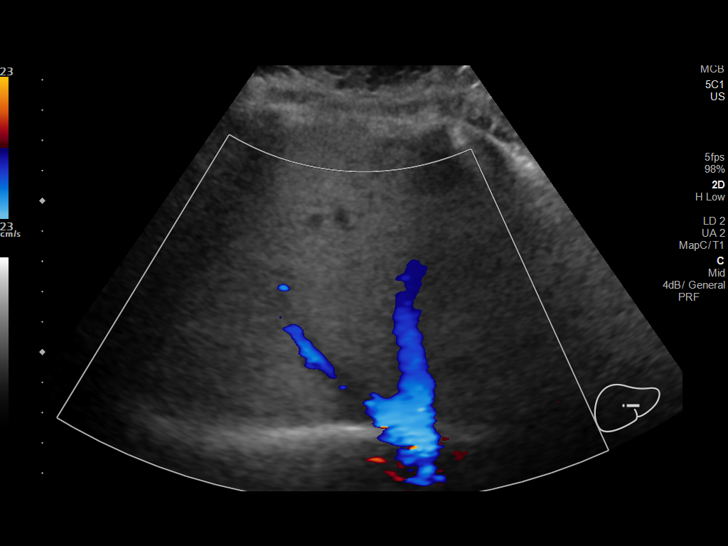
[im 33/72]
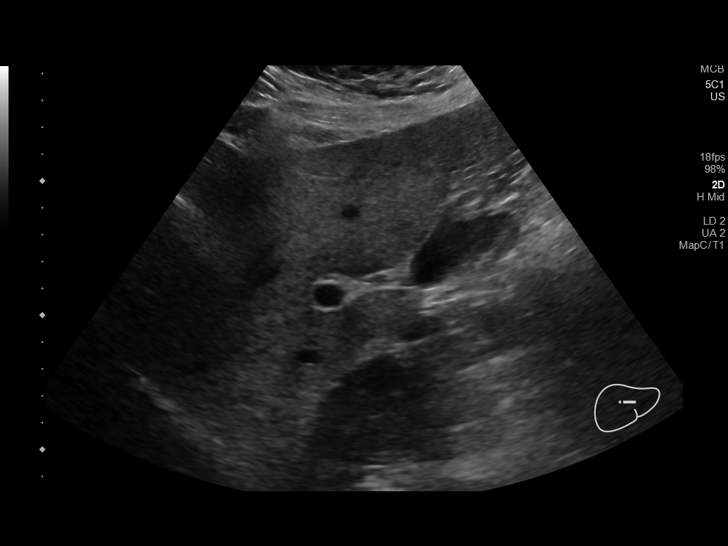
[im 39/72]
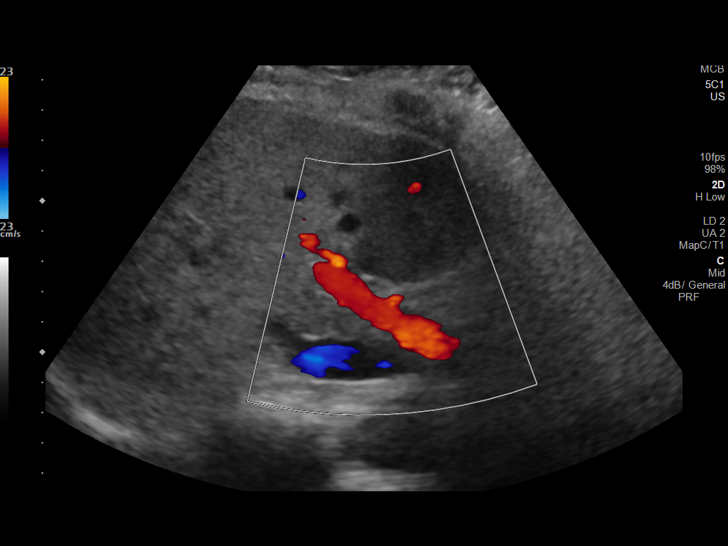
[im 45/72]
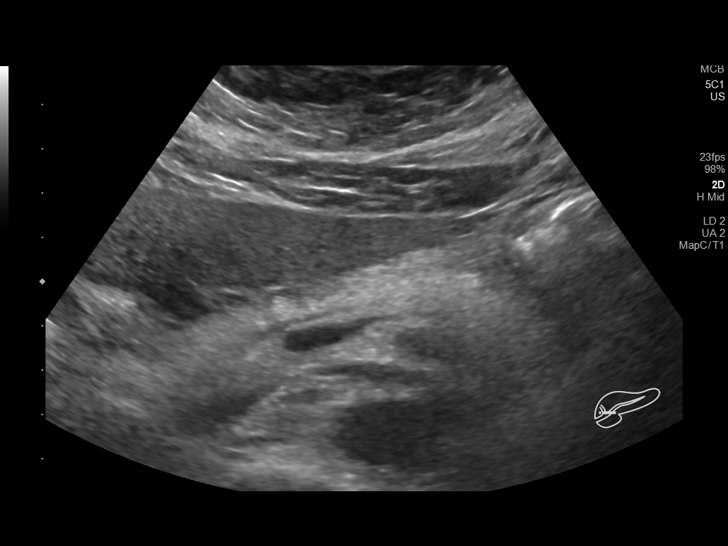
[im 48/72]
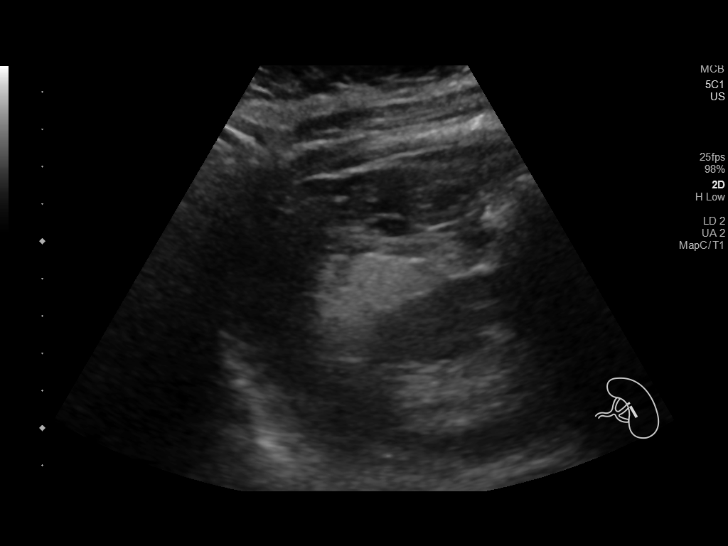
[im 54/72]
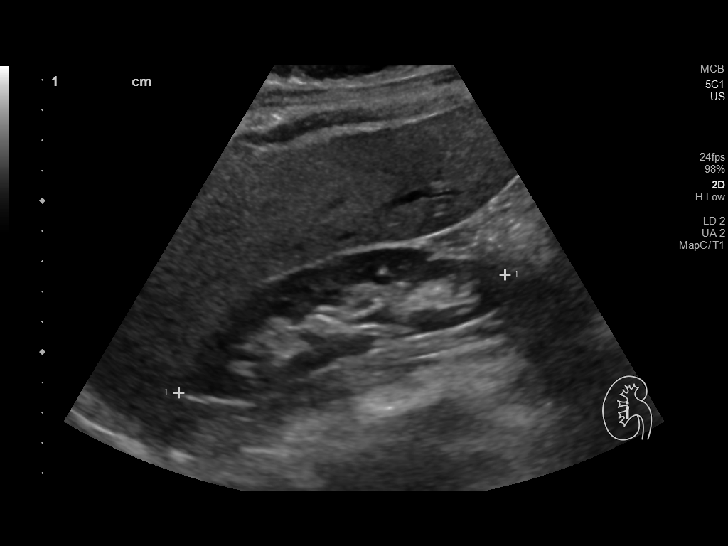
[im 60/72]
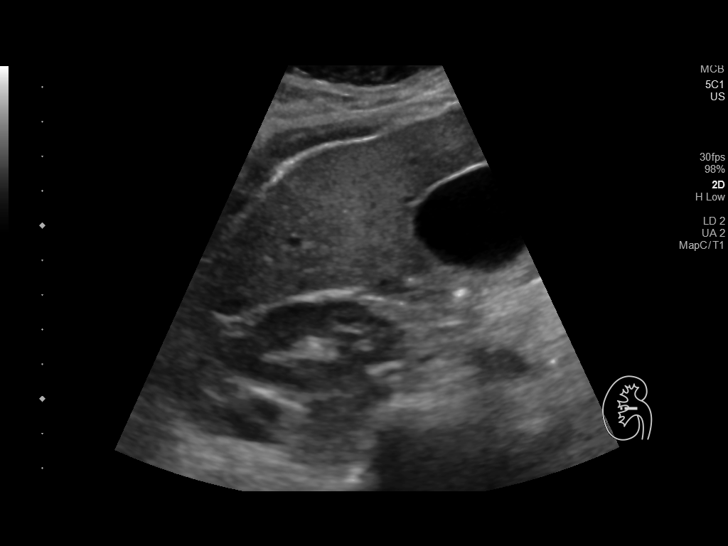
[im 66/72]
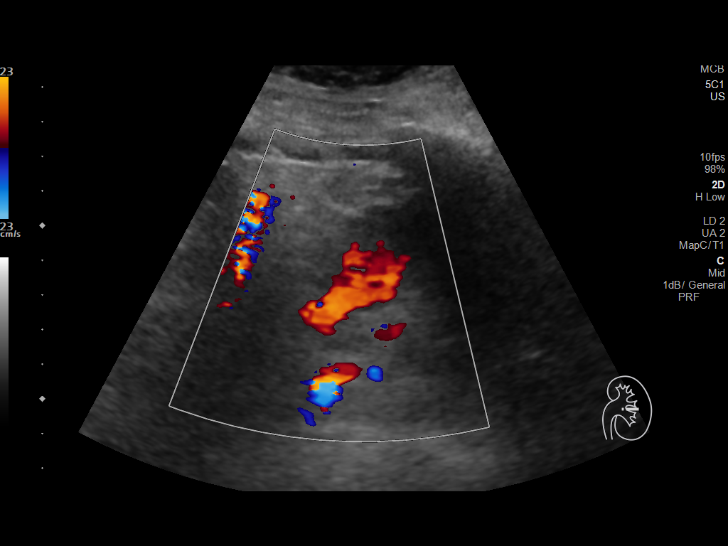
[im 72/72]
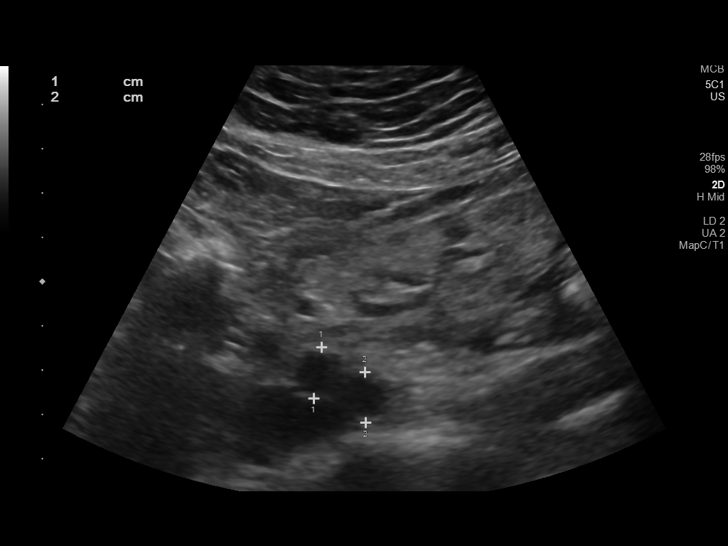

[14 of 25 positions shown; findings below may reference images not displayed]

FINDINGS: Gallbladder: No gallstones or wall thickening visualized. There is
no pericholecystic fluid. No sonographic Murphy sign noted by
sonographer.

Common bile duct: Diameter: 4 mm. No intrahepatic, common hepatic,
or common bile duct dilatation.

Liver: No focal lesion identified. Within normal limits in
parenchymal echogenicity. Portal vein is patent on color Doppler
imaging with normal direction of blood flow towards the liver.

IVC: No abnormality visualized.

Pancreas: No pancreatic mass or inflammatory focus.

Spleen: Size and appearance within normal limits.

Right Kidney: Length: 11.5 cm. Echogenicity within normal limits. No
mass or hydronephrosis visualized.

Left Kidney: Length: 11.7 cm. Echogenicity within normal limits. No
mass or hydronephrosis visualized.

Abdominal aorta: No aneurysm visualized.

Other findings: No demonstrable ascites.
IMPRESSION: Study within normal limits.

## 2019-05-27 ENCOUNTER — Encounter (INDEPENDENT_AMBULATORY_CARE_PROVIDER_SITE_OTHER): Payer: Self-pay | Admitting: Family Medicine

## 2019-05-27 ENCOUNTER — Telehealth (INDEPENDENT_AMBULATORY_CARE_PROVIDER_SITE_OTHER): Payer: 59 | Admitting: Family Medicine

## 2019-05-27 ENCOUNTER — Other Ambulatory Visit: Payer: Self-pay

## 2019-05-27 DIAGNOSIS — Z6838 Body mass index (BMI) 38.0-38.9, adult: Secondary | ICD-10-CM | POA: Diagnosis not present

## 2019-05-27 DIAGNOSIS — E559 Vitamin D deficiency, unspecified: Secondary | ICD-10-CM

## 2019-05-27 DIAGNOSIS — R7303 Prediabetes: Secondary | ICD-10-CM

## 2019-05-27 MED ORDER — VITAMIN D (ERGOCALCIFEROL) 1.25 MG (50000 UNIT) PO CAPS: 50000.0000 [IU] | ORAL_CAPSULE | ORAL | 0 refills | Status: DC

## 2019-05-27 MED ORDER — METFORMIN HCL 500 MG PO TABS: 500.0000 mg | ORAL_TABLET | Freq: Every day | ORAL | 0 refills | Status: DC

## 2019-05-27 MED FILL — VIT D2 1.25 MG (50,000 UNIT: 1.25 MG | 28 days supply | Qty: 4 | Fill #0

## 2019-05-28 NOTE — Progress Notes (Signed)
Office: [phone]  /  Fax: [phone] TeleHealth Visit:  Emily Osborne has verbally consented to this TeleHealth visit today. The patient is located at home, the provider is located at the [facility] office. The participants in this visit include the listed provider and patient. The visit was conducted today via face time.  HPI:   Chief Complaint: OBESITY Emily Osborne is here to discuss her progress with her obesity treatment plan. She is on the Category 2 plan with breakfast and lunch and is following her eating plan approximately 40 % of the time. She states she is walking for 60 minutes 2-3 times per week. Emily Osborne is feeling achey and having chills for the last couple days. She wants to continue the meal plan and is working. She voices when anything happens with her children she is easily derailed off the meal plan. Her weight is of 235 today (up 3 lbs from her last in office visit). We were unable to weigh the patient today for this TeleHealth visit. She feels as if she has gained 3 lbs since her last visit. She has lost 0 lbs since starting treatment with [place].  Vitamin D Deficiency Emily Osborne has a diagnosis of vitamin D deficiency. She is currently taking prescription Vit D. She notes fatigue and denies nausea, vomiting or muscle weakness.  Pre-Diabetes Emily Osborne has a diagnosis of pre-diabetes based on her elevated Hgb A1c and was informed this puts her at greater risk of developing diabetes. She denies GI side effects of metformin, and notes some carbohydrate craving control with metformin. She continues to work on diet and exercise to decrease risk of diabetes. She denies hypoglycemia.  ASSESSMENT AND PLAN:  Class 2 severe obesity with serious comorbidity and body mass index (BMI) of 38.0 to 38.9 in adult, unspecified obesity type ([place])  Prediabetes - Plan: metFORMIN (GLUCOPHAGE) 500 MG tablet  Vitamin D deficiency - Plan: Vitamin D, Ergocalciferol, (DRISDOL) 1.25 MG  (50000 UT) CAPS capsule  PLAN:  Vitamin D Deficiency [name] was informed that low vitamin D levels contributes to fatigue and are associated with obesity, breast, and colon cancer. [name] agrees to continue taking prescription Vit D 50,000 IU every week #4 and we will refill for 1 month. She will follow up for routine testing of vitamin D, at least 2-3 times per year. She was informed of the risk of over-replacement of vitamin D and agrees to not increase her dose unless she discusses this with [place] first. [name] agrees to follow up with our clinic in 2 weeks.  Pre-Diabetes Emily Osborne will continue to work on weight loss, exercise, and decreasing simple carbohydrates in her diet to help decrease the risk of diabetes. We dicussed metformin including benefits and risks. She was informed that eating too many simple carbohydrates or too many calories at one sitting increases the likelihood of GI side effects. [name] agrees to continue taking metformin 500 mg PO q daily #30 and we will refill for 1 month. [name] agrees to follow up with our clinic in 2 weeks as directed to monitor her progress.  Obesity Emily Osborne is currently in the action stage of change. As such, her goal is to continue with weight loss efforts She has agreed to follow the Category 2 plan [name] has been instructed to work up to a goal of 150 minutes of combined cardio and strengthening exercise per week for weight loss and overall health benefits. We discussed the following Behavioral Modification Strategies today: increasing lean protein intake, increasing  vegetables and work on meal planning and easy cooking plans, keeping healthy foods in the home, and planning for success   Emily Osborne has agreed to follow up with our clinic in 2 weeks with Dr. Leafy Ro. She was informed of the importance of frequent follow up visits to maximize her success with intensive lifestyle modifications for her multiple health conditions.  ALLERGIES: Allergies   Allergen Reactions  . Aspirin Other (See Comments)    REACTION: anaphylaxis  . Gentamicin Other (See Comments)    Eye drops turned the sclera bright red  . Ibuprofen Other (See Comments)    REACTION: anaphylaxsis  . Metronidazole Swelling    REACTION: red face/swelling  . Nsaids Other (See Comments)    REACTION: anaphylaxis  . Doxycycline Other (See Comments)    REACTION: severe nausea/vomiting  . Influenza A (H1n1) [place] Vac Other (See Comments)    REACTION: sick for 3 weeks  . Loratadine Other (See Comments)    Fatigue/weakness  . Periactin [Cyproheptadine] Other (See Comments)    paranoid   . Ranitidine Hcl Other (See Comments)    REACTION: Lips turned red /peel  . Sulfamethoxazole-Trimethoprim Other (See Comments)    REACTION: face red/peel  . Tramadol Hcl Other (See Comments)    REACTION: paranoid    MEDICATIONS: Current Outpatient Medications on File Prior to Visit  Medication Sig Dispense Refill  . acetaminophen (TYLENOL) 500 MG tablet Take 1,000 mg by mouth every 6 (six) hours as needed for moderate pain.     Marland Kitchen albuterol (PROAIR HFA) 108 (90 Base) MCG/ACT inhaler USE 2 PUFFS EVERY 4 HOURS AS NEEDED FOR COUGH, WHEEZE OR SHORTNESS OF BREATH. 8.5 each 2  . ALPRAZolam (XANAX) 0.25 MG tablet Take 1 tablet (0.25 mg total) by mouth 3 (three) times daily as needed. 30 tablet 2  . atenolol (TENORMIN) 25 MG tablet TAKE 1/2 TABLETS (12.5 MG TOTAL) BY MOUTH DAILY 45 tablet 3  . azelastine (OPTIVAR) 0.05 % ophthalmic solution Place 1 drop into both eyes 2 (two) times daily as needed.    . Blood Glucose Monitoring Suppl (FREESTYLE FREEDOM) KIT 1 Device by Does not apply route daily. 1 each 0  . budesonide (PULMICORT) 180 MCG/ACT inhaler Inhale into the lungs 2 (two) times daily.    . budesonide-formoterol (SYMBICORT) 80-4.5 MCG/ACT inhaler Take 2 puffs first thing in am and then another 2 puffs about 12 hours later. 1 Inhaler 11  . dexlansoprazole (DEXILANT) 60 MG capsule Take 1  capsule (60 mg total) by mouth daily. 90 capsule 0  . diclofenac sodium (VOLTAREN) 1 % GEL Apply 2 g topically 4 (four) times daily. Rub into affected area of foot 2 to 4 times daily 100 g 2  . famotidine (PEPCID) 20 MG tablet One after supper 30 tablet 11  . fexofenadine (ALLEGRA) 180 MG tablet Take 1 tablet (180 mg total) by mouth daily. 90 tablet 3  . fluticasone (FLONASE) 50 MCG/ACT nasal spray PLACE 2 SPRAYS INTO BOTH NOSTRILS DAILY. 16 g 4  . glucose blood (FREESTYLE LITE) test strip USE TO CHECK BLOOD SUGAR ONCE A DAY 100 each 1  . Iron Combinations (I.L.X. B-12) ELIX Take 1 each by mouth daily as needed (energy). 1 droplet full    . lidocaine (LIDODERM) 5 % Apply to affected area QD PRN for a Week 30 patch 0  . valACYclovir (VALTREX) 1000 MG tablet Take 1,000 mg by mouth 3 (three) times daily as needed.     No current facility-administered medications on  file prior to visit.     PAST MEDICAL HISTORY: Past Medical History:  Diagnosis Date  . Allergy   . Anxiety   . Asthma    pt has inhaler  . Back pain   . C. difficile colitis   . Cataract    bil eyes  . Chest pain   . Chronic fatigue syndrome   . Clostridium difficile colitis 12/13/2016  . Diverticulitis   . Dyspnea   . Dysrhythmia    palpitations  . Edema   . Elevated blood pressure reading without diagnosis of hypertension    "just elevated when I'm in pain" (12/07/2015)  . Fatty liver   . Fibromyalgia   . Ganglion of joint    right wrist  . GERD (gastroesophageal reflux disease)   . H. pylori infection   . Headache    hx of  . Helicobacter pylori (H. pylori) infection 12/13/2016  . Herpes zoster without mention of complication   . HTN (hypertension)   . Hyperlipidemia   . Hyperthyroidism   . IBS (irritable bowel syndrome)   . Leg edema   . Multiple drug allergies 06/18/2017  . Myalgia   . Nontraumatic rupture of Achilles tendon   . Other acute reactions to stress   . Pain in joint, shoulder region   .  Pain in joint, upper arm   . Pain in limb   . Palpitations   . Personal history of other diseases of digestive system   . Pneumonia    once  . PONV (postoperative nausea and vomiting)   . Routine screening for STI (sexually transmitted infection) 12/13/2016  . S/P partial colectomy 06/18/2017  . Thyroid disease    hyperthyroidism  . Type 2 diabetes mellitus with hyperglycemia, without long-term current use of insulin ([place]) 08/07/2018  . Vitamin B 12 deficiency     PAST SURGICAL HISTORY: Past Surgical History:  Procedure Laterality Date  . ABDOMINAL HYSTERECTOMY    . ACHILLES TENDON REPAIR Right 2005  . BUNIONECTOMY Bilateral    Bunionectomy 1983  . COLON SURGERY  01/23/2017   6 to 8 inches sigmoid colon removed  . COLONOSCOPY    . GANGLION CYST EXCISION Right    wrist  . LEFT HEART CATH AND CORONARY ANGIOGRAPHY N/A 10/03/2016   Procedure: Left Heart Cath and Coronary Angiography;  Surgeon: Burnell Blanks, MD;  Location: [facility];  Service: Cardiovascular;  Laterality: N/A;  . MENISCUS REPAIR Right 04/2014  . POLYPECTOMY    . TUBAL LIGATION    . wisdomteeth extraction      SOCIAL HISTORY: Social History   Tobacco Use  . Smoking status: Never Smoker  . Smokeless tobacco: Never Used  Substance Use Topics  . Alcohol use: No    Alcohol/week: 0.0 standard drinks  . Drug use: No    FAMILY HISTORY: Family History  Problem Relation Age of Onset  . Hypertension Father   . Diabetes Father   . Heart disease Father   . Kidney disease Father        Died, 6  . Hyperlipidemia Father   . Leukemia Brother   . Cancer Brother        myleoblastic anemia  . Hyperlipidemia Mother   . Hypertension Mother        Died, 70  . Thyroid disease Mother        Thyroid surgery  . Obesity Mother   . Heart disease Mother   . Pernicious anemia Sister   .  Thyroid disease Sister        On thyroid Rx  . Lupus Daughter   . Coronary artery disease Brother   .  Hypertension Brother   . Colon cancer Neg Hx   . Esophageal cancer Neg Hx   . Rectal cancer Neg Hx   . Stomach cancer Neg Hx   . Pancreatic cancer Neg Hx   . Prostate cancer Neg Hx   . Colon polyps Neg Hx     ROS: Review of Systems  Constitutional: Positive for malaise/fatigue. Negative for weight loss.  Gastrointestinal: Negative for nausea and vomiting.  Musculoskeletal:       Negative muscle weakness    PHYSICAL EXAM: Pt in no acute distress  RECENT LABS AND TESTS: BMET    Component Value Date/Time   NA 138 12/11/2018 0915   NA 142 03/20/2018 1046   K 4.2 12/11/2018 0915   CL 105 12/11/2018 0915   CO2 26 12/11/2018 0915   GLUCOSE 103 (H) 12/11/2018 0915   BUN 18 12/11/2018 0915   BUN 17 03/20/2018 1046   CREATININE 0.85 12/11/2018 0915   CREATININE 0.97 03/23/2016 1501   CALCIUM 9.0 12/11/2018 0915   GFRNONAA >60 11/27/2018 1144   GFRAA >60 11/27/2018 1144   Lab Results  Component Value Date   HGBA1C 6.0 12/11/2018   HGBA1C 5.8 08/07/2018   HGBA1C 6.0 05/13/2018   HGBA1C 5.6 03/20/2018   HGBA1C 5.8 (H) 12/12/2017   Lab Results  Component Value Date   INSULIN 20.0 03/20/2018   INSULIN 17.6 12/12/2017   CBC    Component Value Date/Time   WBC 5.2 11/27/2018 1144   RBC 4.70 11/27/2018 1144   HGB 13.5 11/27/2018 1144   HGB 13.5 12/12/2017 1102   HCT 41.6 11/27/2018 1144   HCT 41.8 12/12/2017 1102   PLT 253 11/27/2018 1144   MCV 88.5 11/27/2018 1144   MCV 89 12/12/2017 1102   MCH 28.7 11/27/2018 1144   MCHC 32.5 11/27/2018 1144   RDW 14.9 11/27/2018 1144   RDW 15.1 12/12/2017 1102   LYMPHSABS 2.3 08/07/2018 1208   LYMPHSABS 2.3 12/12/2017 1102   MONOABS 0.5 08/07/2018 1208   EOSABS 0.1 08/07/2018 1208   EOSABS 0.1 12/12/2017 1102   BASOSABS 0.0 08/07/2018 1208   BASOSABS 0.0 12/12/2017 1102   Iron/TIBC/Ferritin/ %Sat No results found for: IRON, TIBC, FERRITIN, IRONPCTSAT Lipid Panel     Component Value Date/Time   CHOL 203 (H) 12/11/2018  0915   CHOL 197 03/20/2018 1046   TRIG 162.0 (H) 12/11/2018 0915   HDL 50.30 12/11/2018 0915   HDL 57 03/20/2018 1046   CHOLHDL 4 12/11/2018 0915   VLDL 32.4 12/11/2018 0915   LDLCALC 120 (H) 12/11/2018 0915   LDLCALC 124 (H) 03/20/2018 1046   LDLDIRECT 139.4 12/26/2011 0905   Hepatic Function Panel     Component Value Date/Time   PROT 6.5 12/11/2018 0915   PROT 6.8 03/20/2018 1046   ALBUMIN 4.0 12/11/2018 0915   ALBUMIN 4.4 03/20/2018 1046   AST 24 12/11/2018 0915   ALT 32 12/11/2018 0915   ALKPHOS 59 12/11/2018 0915   BILITOT 0.5 12/11/2018 0915   BILITOT 0.4 03/20/2018 1046   BILIDIR 0.1 09/07/2014 1031      Component Value Date/Time   TSH 0.87 10/07/2018 0953   TSH 0.44 08/07/2018 1208   TSH 0.67 06/05/2018 1638      I, Trixie Dredge, am acting as transcriptionist for Ilene Qua, MD  I have reviewed  the above documentation for accuracy and completeness, and I agree with the above. - Ilene Qua, MD

## 2019-05-29 ENCOUNTER — Encounter: Payer: 59 | Admitting: Physical Therapy

## 2019-06-03 MED FILL — metFORMIN HCL 500 MG TABS: 500 | 30 days supply | Qty: 30 | Fill #0

## 2019-06-04 ENCOUNTER — Telehealth: Payer: Self-pay

## 2019-06-04 ENCOUNTER — Telehealth: Payer: Self-pay | Admitting: Family Medicine

## 2019-06-04 DIAGNOSIS — L280 Lichen simplex chronicus: Secondary | ICD-10-CM | POA: Diagnosis not present

## 2019-06-04 MED FILL — HYDROCORTISONE 2.5% OINT: 2.5 | 10 days supply | Qty: 28 | Fill #0

## 2019-06-04 NOTE — Telephone Encounter (Signed)
Spoke with patient. She states having a headache off and on on the left side and states having some forgetfulness. I advised patient that we should probably see her first. Appointment scheduled for Thursday.

## 2019-06-04 NOTE — Telephone Encounter (Signed)
Pati

## 2019-06-04 NOTE — Telephone Encounter (Signed)
agree

## 2019-06-04 NOTE — Telephone Encounter (Signed)
We need a reason ---- then ok to put referral in

## 2019-06-04 NOTE — Telephone Encounter (Signed)
Copied from [place] [id number]. Topic: General - Other >> Jun 04, 2019  9:52 AM Alease Frame wrote: Reason for CRM: Patient needs a referral from Dr Carollee Herter to see and/or schedule appt for [facility] [id number], [place], [place] [address] with Dr Posey Pronto. Please advise

## 2019-06-05 ENCOUNTER — Ambulatory Visit: Payer: 59 | Admitting: Physical Therapy

## 2019-06-10 ENCOUNTER — Other Ambulatory Visit: Payer: Self-pay

## 2019-06-10 ENCOUNTER — Telehealth (INDEPENDENT_AMBULATORY_CARE_PROVIDER_SITE_OTHER): Payer: 59 | Admitting: Family Medicine

## 2019-06-10 ENCOUNTER — Encounter (INDEPENDENT_AMBULATORY_CARE_PROVIDER_SITE_OTHER): Payer: Self-pay | Admitting: Family Medicine

## 2019-06-10 DIAGNOSIS — R7303 Prediabetes: Secondary | ICD-10-CM

## 2019-06-10 DIAGNOSIS — Z6838 Body mass index (BMI) 38.0-38.9, adult: Secondary | ICD-10-CM | POA: Diagnosis not present

## 2019-06-10 MED FILL — FEXOFENADINE HCL 180 MG TAB: 180 | 90 days supply | Qty: 90 | Fill #1

## 2019-06-11 ENCOUNTER — Ambulatory Visit: Payer: 59 | Admitting: Family Medicine

## 2019-06-11 ENCOUNTER — Encounter: Payer: Self-pay | Admitting: Family Medicine

## 2019-06-11 ENCOUNTER — Other Ambulatory Visit: Payer: Self-pay

## 2019-06-11 DIAGNOSIS — R413 Other amnesia: Secondary | ICD-10-CM | POA: Diagnosis not present

## 2019-06-11 DIAGNOSIS — I10 Essential (primary) hypertension: Secondary | ICD-10-CM | POA: Insufficient documentation

## 2019-06-11 DIAGNOSIS — G4452 New daily persistent headache (NDPH): Secondary | ICD-10-CM

## 2019-06-11 LAB — TSH: TSH: 0.49 u[IU]/mL (ref 0.35–4.50)

## 2019-06-11 LAB — HEMOGLOBIN A1C: Hgb A1c MFr Bld: 5.8 % (ref 4.6–6.5)

## 2019-06-11 LAB — COMPREHENSIVE METABOLIC PANEL
ALT: 31 U/L (ref 0–35)
AST: 27 U/L (ref 0–37)
Albumin: 4.5 g/dL (ref 3.5–5.2)
Alkaline Phosphatase: 58 U/L (ref 39–117)
BUN: 15 mg/dL (ref 6–23)
CO2: 27 mEq/L (ref 19–32)
Calcium: 9.4 mg/dL (ref 8.4–10.5)
Chloride: 105 mEq/L (ref 96–112)
Creatinine, Ser: 0.89 mg/dL (ref 0.40–1.20)
GFR: 78.44 mL/min (ref >60.00)
Glucose, Bld: 85 mg/dL (ref 70–99)
Potassium: 4.7 mEq/L (ref 3.5–5.1)
Sodium: 139 mEq/L (ref 135–145)
Total Bilirubin: 0.5 mg/dL (ref 0.2–1.2)
Total Protein: 7 g/dL (ref 6.0–8.3)

## 2019-06-11 LAB — LIPID PANEL
Cholesterol: 214 mg/dL — ABNORMAL HIGH (ref 0–200)
HDL: 52.6 mg/dL (ref >39.00)
LDL Cholesterol: 145 mg/dL — ABNORMAL HIGH (ref 0–99)
NonHDL: 161.73
Total CHOL/HDL Ratio: 4
Triglycerides: 86 mg/dL (ref 0.0–149.0)
VLDL: 17.2 mg/dL (ref 0.0–40.0)

## 2019-06-11 LAB — SEDIMENTATION RATE: Sed Rate: 16 mm/hr (ref 0–30)

## 2019-06-11 LAB — VITAMIN D 25 HYDROXY (VIT D DEFICIENCY, FRACTURES): VITD: 46.09 ng/mL (ref 30.00–100.00)

## 2019-06-11 NOTE — Progress Notes (Signed)
Office: [phone]  /  Fax: [phone] TeleHealth Visit:  Emily Osborne has verbally consented to this TeleHealth visit today. The patient is located at home, the provider is located at the [facility] office. The participants in this visit include the listed provider and patient. The visit was conducted today via face time.  HPI:   Chief Complaint: OBESITY Emily Osborne is here to discuss her progress with her obesity treatment plan. She is on the Category 2 plan and is following her eating plan approximately 75 % of the time. She states she is exercising 0 minutes 0 times per week. Emily Osborne continues to do well on her Category 2 plan. Her hunger is controlled for the most part, but she does sometimes go too long in between meals. She states she has lost 7 lbs since her last visit. She states her blood pressure was 112/79 with a pulse of 76.  We were unable to weigh the patient today for this TeleHealth visit. She feels as if she has lost 7 lbs since her last visit. She has lost 0 lbs since starting treatment with [place].  Pre-Diabetes Emily Osborne has a diagnosis of pre-diabetes based on her elevated Hgb A1c and was informed this puts her at greater risk of developing diabetes. She is tolerating metformin weel. She still notes some hypoglycemic episodes occasionally. She states her CBGs fasting were 88. She denies nausea or nausea. She continues to work on diet and exercise to decrease risk of diabetes.   ASSESSMENT AND PLAN:  Prediabetes  Class 2 severe obesity with serious comorbidity and body mass index (BMI) of 38.0 to 38.9 in adult, unspecified obesity type Emily Osborne)  PLAN:  Pre-Diabetes Emily Osborne will continue to work on weight loss, exercise, and decreasing simple carbohydrates in her diet to help decrease the risk of diabetes. We dicussed metformin including benefits and risks. She was informed that eating too many simple carbohydrates or too many calories at one sitting increases the  likelihood of GI side effects. Emily Osborne was encouraged to not skip meals. We will continue to monitor but may need to increase dose if her hypoglycemia continues. Emily Osborne agrees to follow up with our clinic in 3 weeks as directed to monitor her progress.  Obesity Emily Osborne is currently in the action stage of change. As such, her goal is to continue with weight loss efforts She has agreed to follow the Category 2 plan Emily Osborne has been instructed to work up to a goal of 150 minutes of combined cardio and strengthening exercise per week for weight loss and overall health benefits. We discussed the following Behavioral Modification Strategies today: increasing lean protein intake, decreasing simple carbohydrates, and no skipping meals    Emily Osborne has agreed to follow up with our clinic in 3 weeks. She was informed of the importance of frequent follow up visits to maximize her success with intensive lifestyle modifications for her multiple health conditions.  ALLERGIES: Allergies  Allergen Reactions  . Aspirin Other (See Comments)    REACTION: anaphylaxis  . Gentamicin Other (See Comments)    Eye drops turned the sclera bright red  . Ibuprofen Other (See Comments)    REACTION: anaphylaxsis  . Metronidazole Swelling    REACTION: red face/swelling  . Nsaids Other (See Comments)    REACTION: anaphylaxis  . Doxycycline Other (See Comments)    REACTION: severe nausea/vomiting  . Influenza A (H1n1) [place] Vac Other (See Comments)    REACTION: sick for 3 weeks  . Loratadine Other (  See Comments)    Fatigue/weakness  . Periactin [Cyproheptadine] Other (See Comments)    paranoid   . Ranitidine Hcl Other (See Comments)    REACTION: Lips turned red /peel  . Sulfamethoxazole-Trimethoprim Other (See Comments)    REACTION: face red/peel  . Tramadol Hcl Other (See Comments)    REACTION: paranoid    MEDICATIONS: Current Outpatient Medications on File Prior to Visit  Medication Sig Dispense Refill  .  acetaminophen (TYLENOL) 500 MG tablet Take 1,000 mg by mouth every 6 (six) hours as needed for moderate pain.     Marland Kitchen albuterol (PROAIR HFA) 108 (90 Base) MCG/ACT inhaler USE 2 PUFFS EVERY 4 HOURS AS NEEDED FOR COUGH, WHEEZE OR SHORTNESS OF BREATH. 8.5 each 2  . ALPRAZolam (XANAX) 0.25 MG tablet Take 1 tablet (0.25 mg total) by mouth 3 (three) times daily as needed. 30 tablet 2  . atenolol (TENORMIN) 25 MG tablet TAKE 1/2 TABLETS (12.5 MG TOTAL) BY MOUTH DAILY 45 tablet 3  . azelastine (OPTIVAR) 0.05 % ophthalmic solution Place 1 drop into both eyes 2 (two) times daily as needed.    . Blood Glucose Monitoring Suppl (FREESTYLE FREEDOM) KIT 1 Device by Does not apply route daily. 1 each 0  . budesonide (PULMICORT) 180 MCG/ACT inhaler Inhale into the lungs 2 (two) times daily.    . budesonide-formoterol (SYMBICORT) 80-4.5 MCG/ACT inhaler Take 2 puffs first thing in am and then another 2 puffs about 12 hours later. 1 Inhaler 11  . dexlansoprazole (DEXILANT) 60 MG capsule Take 1 capsule (60 mg total) by mouth daily. 90 capsule 0  . diclofenac sodium (VOLTAREN) 1 % GEL Apply 2 g topically 4 (four) times daily. Rub into affected area of foot 2 to 4 times daily 100 g 2  . famotidine (PEPCID) 20 MG tablet One after supper 30 tablet 11  . fexofenadine (ALLEGRA) 180 MG tablet Take 1 tablet (180 mg total) by mouth daily. 90 tablet 3  . fluticasone (FLONASE) 50 MCG/ACT nasal spray PLACE 2 SPRAYS INTO BOTH NOSTRILS DAILY. 16 g 4  . glucose blood (FREESTYLE LITE) test strip USE TO CHECK BLOOD SUGAR ONCE A DAY 100 each 1  . Iron Combinations (I.L.X. B-12) ELIX Take 1 each by mouth daily as needed (energy). 1 droplet full    . lidocaine (LIDODERM) 5 % Apply to affected area QD PRN for a Week 30 patch 0  . metFORMIN (GLUCOPHAGE) 500 MG tablet Take 1 tablet (500 mg total) by mouth daily with breakfast. 30 tablet 0  . valACYclovir (VALTREX) 1000 MG tablet Take 1,000 mg by mouth 3 (three) times daily as needed.    .  Vitamin D, Ergocalciferol, (DRISDOL) 1.25 MG (50000 UT) CAPS capsule Take 1 capsule (50,000 Units total) by mouth every 7 (seven) days. 4 capsule 0   No current facility-administered medications on file prior to visit.     PAST MEDICAL HISTORY: Past Medical History:  Diagnosis Date  . Allergy   . Anxiety   . Asthma    pt has inhaler  . Back pain   . C. difficile colitis   . Cataract    bil eyes  . Chest pain   . Chronic fatigue syndrome   . Clostridium difficile colitis 12/13/2016  . Diverticulitis   . Dyspnea   . Dysrhythmia    palpitations  . Edema   . Elevated blood pressure reading without diagnosis of hypertension    "just elevated when I'm in pain" (12/07/2015)  . Fatty  liver   . Fibromyalgia   . Ganglion of joint    right wrist  . GERD (gastroesophageal reflux disease)   . H. pylori infection   . Headache    hx of  . Helicobacter pylori (H. pylori) infection 12/13/2016  . Herpes zoster without mention of complication   . HTN (hypertension)   . Hyperlipidemia   . Hyperthyroidism   . IBS (irritable bowel syndrome)   . Leg edema   . Multiple drug allergies 06/18/2017  . Myalgia   . Nontraumatic rupture of Achilles tendon   . Other acute reactions to stress   . Pain in joint, shoulder region   . Pain in joint, upper arm   . Pain in limb   . Palpitations   . Personal history of other diseases of digestive system   . Pneumonia    once  . PONV (postoperative nausea and vomiting)   . Routine screening for STI (sexually transmitted infection) 12/13/2016  . S/P partial colectomy 06/18/2017  . Thyroid disease    hyperthyroidism  . Type 2 diabetes mellitus with hyperglycemia, without long-term current use of insulin ([place]) 08/07/2018  . Vitamin B 12 deficiency     PAST SURGICAL HISTORY: Past Surgical History:  Procedure Laterality Date  . ABDOMINAL HYSTERECTOMY    . ACHILLES TENDON REPAIR Right 2005  . BUNIONECTOMY Bilateral    Bunionectomy 1983  . COLON  SURGERY  01/23/2017   6 to 8 inches sigmoid colon removed  . COLONOSCOPY    . GANGLION CYST EXCISION Right    wrist  . LEFT HEART CATH AND CORONARY ANGIOGRAPHY N/A 10/03/2016   Procedure: Left Heart Cath and Coronary Angiography;  Surgeon: Burnell Blanks, MD;  Location: [facility];  Service: Cardiovascular;  Laterality: N/A;  . MENISCUS REPAIR Right 04/2014  . POLYPECTOMY    . TUBAL LIGATION    . wisdomteeth extraction      SOCIAL HISTORY: Social History   Tobacco Use  . Smoking status: Never Smoker  . Smokeless tobacco: Never Used  Substance Use Topics  . Alcohol use: No    Alcohol/week: 0.0 standard drinks  . Drug use: No    FAMILY HISTORY: Family History  Problem Relation Age of Onset  . Hypertension Father   . Diabetes Father   . Heart disease Father   . Kidney disease Father        Died, 8  . Hyperlipidemia Father   . Leukemia Brother   . Cancer Brother        myleoblastic anemia  . Hyperlipidemia Mother   . Hypertension Mother        Died, 34  . Thyroid disease Mother        Thyroid surgery  . Obesity Mother   . Heart disease Mother   . Pernicious anemia Sister   . Thyroid disease Sister        On thyroid Rx  . Lupus Daughter   . Coronary artery disease Brother   . Hypertension Brother   . Colon cancer Neg Hx   . Esophageal cancer Neg Hx   . Rectal cancer Neg Hx   . Stomach cancer Neg Hx   . Pancreatic cancer Neg Hx   . Prostate cancer Neg Hx   . Colon polyps Neg Hx     ROS: Review of Systems  Constitutional: Positive for weight loss.  Gastrointestinal: Negative for nausea and vomiting.  Endo/Heme/Allergies:       Positive hypoglycemia  PHYSICAL EXAM: Pt in no acute distress  RECENT LABS AND TESTS: BMET    Component Value Date/Time   NA 138 12/11/2018 0915   NA 142 03/20/2018 1046   K 4.2 12/11/2018 0915   CL 105 12/11/2018 0915   CO2 26 12/11/2018 0915   GLUCOSE 103 (H) 12/11/2018 0915   BUN 18 12/11/2018 0915    BUN 17 03/20/2018 1046   CREATININE 0.85 12/11/2018 0915   CREATININE 0.97 03/23/2016 1501   CALCIUM 9.0 12/11/2018 0915   GFRNONAA >60 11/27/2018 1144   GFRAA >60 11/27/2018 1144   Lab Results  Component Value Date   HGBA1C 6.0 12/11/2018   HGBA1C 5.8 08/07/2018   HGBA1C 6.0 05/13/2018   HGBA1C 5.6 03/20/2018   HGBA1C 5.8 (H) 12/12/2017   Lab Results  Component Value Date   INSULIN 20.0 03/20/2018   INSULIN 17.6 12/12/2017   CBC    Component Value Date/Time   WBC 5.2 11/27/2018 1144   RBC 4.70 11/27/2018 1144   HGB 13.5 11/27/2018 1144   HGB 13.5 12/12/2017 1102   HCT 41.6 11/27/2018 1144   HCT 41.8 12/12/2017 1102   PLT 253 11/27/2018 1144   MCV 88.5 11/27/2018 1144   MCV 89 12/12/2017 1102   MCH 28.7 11/27/2018 1144   MCHC 32.5 11/27/2018 1144   RDW 14.9 11/27/2018 1144   RDW 15.1 12/12/2017 1102   LYMPHSABS 2.3 08/07/2018 1208   LYMPHSABS 2.3 12/12/2017 1102   MONOABS 0.5 08/07/2018 1208   EOSABS 0.1 08/07/2018 1208   EOSABS 0.1 12/12/2017 1102   BASOSABS 0.0 08/07/2018 1208   BASOSABS 0.0 12/12/2017 1102   Iron/TIBC/Ferritin/ %Sat No results found for: IRON, TIBC, FERRITIN, IRONPCTSAT Lipid Panel     Component Value Date/Time   CHOL 203 (H) 12/11/2018 0915   CHOL 197 03/20/2018 1046   TRIG 162.0 (H) 12/11/2018 0915   HDL 50.30 12/11/2018 0915   HDL 57 03/20/2018 1046   CHOLHDL 4 12/11/2018 0915   VLDL 32.4 12/11/2018 0915   LDLCALC 120 (H) 12/11/2018 0915   LDLCALC 124 (H) 03/20/2018 1046   LDLDIRECT 139.4 12/26/2011 0905   Hepatic Function Panel     Component Value Date/Time   PROT 6.5 12/11/2018 0915   PROT 6.8 03/20/2018 1046   ALBUMIN 4.0 12/11/2018 0915   ALBUMIN 4.4 03/20/2018 1046   AST 24 12/11/2018 0915   ALT 32 12/11/2018 0915   ALKPHOS 59 12/11/2018 0915   BILITOT 0.5 12/11/2018 0915   BILITOT 0.4 03/20/2018 1046   BILIDIR 0.1 09/07/2014 1031      Component Value Date/Time   TSH 0.87 10/07/2018 0953   TSH 0.44 08/07/2018  1208   TSH 0.67 06/05/2018 1638      I, Trixie Dredge, am acting as Location manager for Dennard Nip, MD I have reviewed the above documentation for accuracy and completeness, and I agree with the above. -Dennard Nip, MD

## 2019-06-11 NOTE — Assessment & Plan Note (Signed)
Check labs See if ha get better with stopping metformin Consider mri / neuro referral if necessary

## 2019-06-11 NOTE — Assessment & Plan Note (Signed)
con't with healthy weight and wellness

## 2019-06-11 NOTE — Patient Instructions (Signed)
General Headache Without Cause A headache is pain or discomfort felt around the head or neck area. The specific cause of a headache may not be found. There are many causes and types of headaches. A few common ones are:  Tension headaches.  Migraine headaches.  Cluster headaches.  Chronic daily headaches. Follow these instructions at home: Watch your condition for any changes. Let your health care provider know about them. Take these steps to help with your condition: Managing pain      Take over-the-counter and prescription medicines only as told by your health care provider.  Lie down in a dark, quiet room when you have a headache.  If directed, put ice on your head and neck area: ? Put ice in a plastic bag. ? Place a towel between your skin and the bag. ? Leave the ice on for 20 minutes, 2-3 times per day.  If directed, apply heat to the affected area. Use the heat source that your health care provider recommends, such as a moist heat pack or a heating pad. ? Place a towel between your skin and the heat source. ? Leave the heat on for 20-30 minutes. ? Remove the heat if your skin turns bright red. This is especially important if you are unable to feel pain, heat, or cold. You may have a greater risk of getting burned.  Keep lights dim if bright lights bother you or make your headaches worse. Eating and drinking  Eat meals on a regular schedule.  If you drink alcohol: ? Limit how much you use to:  0-1 drink a day for women.  0-2 drinks a day for men. ? Be aware of how much alcohol is in your drink. In the [place] one drink equals one 12 oz bottle of beer (355 mL), one 5 oz glass of wine (148 mL), or one 1 oz glass of hard liquor (44 mL).  Stop drinking caffeine, or decrease the amount of caffeine you drink. General instructions   Keep a headache journal to help find out what may trigger your headaches. For example, write down: ? What you eat and drink. ? How much  sleep you get. ? Any change to your diet or medicines.  Try massage or other relaxation techniques.  Limit stress.  Sit up straight, and do not tense your muscles.  Do not use any products that contain nicotine or tobacco, such as cigarettes, e-cigarettes, and chewing tobacco. If you need help quitting, ask your health care provider.  Exercise regularly as told by your health care provider.  Sleep on a regular schedule. Get 7-9 hours of sleep each night, or the amount recommended by your health care provider.  Keep all follow-up visits as told by your health care provider. This is important. Contact a health care provider if:  Your symptoms are not helped by medicine.  You have a headache that is different from the usual headache.  You have nausea or you vomit.  You have a fever. Get help right away if:  Your headache becomes severe quickly.  Your headache gets worse after moderate to intense physical activity.  You have repeated vomiting.  You have a stiff neck.  You have a loss of vision.  You have problems with speech.  You have pain in the eye or ear.  You have muscular weakness or loss of muscle control.  You lose your balance or have trouble walking.  You feel faint or pass out.  You have confusion.    You have a seizure. Summary  A headache is pain or discomfort felt around the head or neck area.  There are many causes and types of headaches. In some cases, the cause may not be found.  Keep a headache journal to help find out what may trigger your headaches. Watch your condition for any changes. Let your health care provider know about them.  Contact a health care provider if you have a headache that is different from the usual headache, or if your symptoms are not helped by medicine.  Get help right away if your headache becomes severe, you vomit, you have a loss of vision, you lose your balance, or you have a seizure. This information is not  intended to replace advice given to you by your health care provider. Make sure you discuss any questions you have with your health care provider. Document Released: 08/06/2005 Document Revised: 02/24/2018 Document Reviewed: 02/24/2018 Elsevier Patient Education  2020 [employer]  

## 2019-06-11 NOTE — Assessment & Plan Note (Signed)
Well controlled, no changes to meds. Encouraged heart healthy diet such as the DASH diet and exercise as tolerated.  °

## 2019-06-11 NOTE — Assessment & Plan Note (Signed)
mmse 30/30 Check labs  Pt thinks it may be from metformin ----  We can hold the metformin for a few weeks to see if it makes a difference

## 2019-06-11 NOTE — Progress Notes (Signed)
Patient ID: Emily Osborne, female    DOB: May 30, 1961  Age: 57 y.o. MRN: [id number]    Subjective:  Subjective  HPI Emily Osborne presents for memory issues for the last year but its getting worse and she is having headaches.  She has had none this week     Normally she gets them 2-3 x a week.  The last 1-2 hours-- it goes away with tylenol.   She had had body aches and had a covid test through work about 2 weeks ago and it was negative  Review of Systems  Constitutional: Negative for appetite change, diaphoresis, fatigue and unexpected weight change.  Eyes: Negative for pain, redness and visual disturbance.  Respiratory: Negative for cough, chest tightness, shortness of breath and wheezing.   Cardiovascular: Negative for chest pain, palpitations and leg swelling.  Endocrine: Negative for cold intolerance, heat intolerance, polydipsia, polyphagia and polyuria.  Genitourinary: Negative for difficulty urinating, dysuria and frequency.  Neurological: Negative for dizziness, light-headedness, numbness and headaches.    History Past Medical History:  Diagnosis Date  . Allergy   . Anxiety   . Asthma    pt has inhaler  . Back pain   . C. difficile colitis   . Cataract    bil eyes  . Chest pain   . Chronic fatigue syndrome   . Clostridium difficile colitis 12/13/2016  . Diverticulitis   . Dyspnea   . Dysrhythmia    palpitations  . Edema   . Elevated blood pressure reading without diagnosis of hypertension    "just elevated when I'm in pain" (12/07/2015)  . Fatty liver   . Fibromyalgia   . Ganglion of joint    right wrist  . GERD (gastroesophageal reflux disease)   . H. pylori infection   . Headache    hx of  . Helicobacter pylori (H. pylori) infection 12/13/2016  . Herpes zoster without mention of complication   . HTN (hypertension)   . Hyperlipidemia   . Hyperthyroidism   . IBS (irritable bowel syndrome)   . Leg edema   . Multiple drug allergies 06/18/2017  . Myalgia    . Nontraumatic rupture of Achilles tendon   . Other acute reactions to stress   . Pain in joint, shoulder region   . Pain in joint, upper arm   . Pain in limb   . Palpitations   . Personal history of other diseases of digestive system   . Pneumonia    once  . PONV (postoperative nausea and vomiting)   . Routine screening for STI (sexually transmitted infection) 12/13/2016  . S/P partial colectomy 06/18/2017  . Thyroid disease    hyperthyroidism  . Type 2 diabetes mellitus with hyperglycemia, without long-term current use of insulin ([place]) 08/07/2018  . Vitamin B 12 deficiency     She has a past surgical history that includes Abdominal hysterectomy; Tubal ligation; Bunionectomy (Bilateral); Achilles tendon repair (Right, 2005); Ganglion cyst excision (Right); Meniscus repair (Right, 04/2014); wisdomteeth extraction; LEFT HEART CATH AND CORONARY ANGIOGRAPHY (N/A, 10/03/2016); Colon surgery (01/23/2017); Colonoscopy; and Polypectomy.   Her family history includes Cancer in her brother; Coronary artery disease in her brother; Diabetes in her father; Heart disease in her father and mother; Hyperlipidemia in her father and mother; Hypertension in her brother, father, and mother; Kidney disease in her father; Leukemia in her brother; Lupus in her daughter; Obesity in her mother; Pernicious anemia in her sister; Thyroid disease in her mother and sister.She reports  that she has never smoked. She has never used smokeless tobacco. She reports that she does not drink alcohol or use drugs.  Current Outpatient Medications on File Prior to Visit  Medication Sig Dispense Refill  . acetaminophen (TYLENOL) 500 MG tablet Take 1,000 mg by mouth every 6 (six) hours as needed for moderate pain.     Marland Kitchen albuterol (PROAIR HFA) 108 (90 Base) MCG/ACT inhaler USE 2 PUFFS EVERY 4 HOURS AS NEEDED FOR COUGH, WHEEZE OR SHORTNESS OF BREATH. 8.5 each 2  . ALPRAZolam (XANAX) 0.25 MG tablet Take 1 tablet (0.25 mg total) by mouth  3 (three) times daily as needed. 30 tablet 2  . atenolol (TENORMIN) 25 MG tablet TAKE 1/2 TABLETS (12.5 MG TOTAL) BY MOUTH DAILY 45 tablet 3  . azelastine (OPTIVAR) 0.05 % ophthalmic solution Place 1 drop into both eyes 2 (two) times daily as needed.    . Blood Glucose Monitoring Suppl (FREESTYLE FREEDOM) KIT 1 Device by Does not apply route daily. 1 each 0  . budesonide (PULMICORT) 180 MCG/ACT inhaler Inhale into the lungs 2 (two) times daily.    . budesonide-formoterol (SYMBICORT) 80-4.5 MCG/ACT inhaler Take 2 puffs first thing in am and then another 2 puffs about 12 hours later. 1 Inhaler 11  . dexlansoprazole (DEXILANT) 60 MG capsule Take 1 capsule (60 mg total) by mouth daily. 90 capsule 0  . diclofenac sodium (VOLTAREN) 1 % GEL Apply 2 g topically 4 (four) times daily. Rub into affected area of foot 2 to 4 times daily 100 g 2  . famotidine (PEPCID) 20 MG tablet One after supper 30 tablet 11  . fexofenadine (ALLEGRA) 180 MG tablet Take 1 tablet (180 mg total) by mouth daily. 90 tablet 3  . fluticasone (FLONASE) 50 MCG/ACT nasal spray PLACE 2 SPRAYS INTO BOTH NOSTRILS DAILY. 16 g 4  . glucose blood (FREESTYLE LITE) test strip USE TO CHECK BLOOD SUGAR ONCE A DAY 100 each 1  . Iron Combinations (I.L.X. B-12) ELIX Take 1 each by mouth daily as needed (energy). 1 droplet full    . lidocaine (LIDODERM) 5 % Apply to affected area QD PRN for a Week 30 patch 0  . metFORMIN (GLUCOPHAGE) 500 MG tablet Take 1 tablet (500 mg total) by mouth daily with breakfast. 30 tablet 0  . valACYclovir (VALTREX) 1000 MG tablet Take 1,000 mg by mouth 3 (three) times daily as needed.    . Vitamin D, Ergocalciferol, (DRISDOL) 1.25 MG (50000 UT) CAPS capsule Take 1 capsule (50,000 Units total) by mouth every 7 (seven) days. 4 capsule 0   No current facility-administered medications on file prior to visit.      Objective:  Objective  Physical Exam Vitals signs and nursing note reviewed.  Constitutional:       Appearance: She is well-developed.  HENT:     Head: Normocephalic and atraumatic.  Eyes:     Conjunctiva/sclera: Conjunctivae normal.  Neck:     Musculoskeletal: Normal range of motion and neck supple.     Thyroid: No thyromegaly.     Vascular: No carotid bruit or JVD.  Cardiovascular:     Rate and Rhythm: Normal rate and regular rhythm.     Heart sounds: Normal heart sounds. No murmur.  Pulmonary:     Effort: Pulmonary effort is normal. No respiratory distress.     Breath sounds: Normal breath sounds. No wheezing or rales.  Chest:     Chest wall: No tenderness.  Neurological:  Mental Status: She is alert and oriented to person, place, and time.     Comments: mmse 30/30  Psychiatric:        Mood and Affect: Mood normal.        Speech: Speech normal.        Behavior: Behavior normal.    BP 124/82 (BP Location: Right Arm, Patient Position: Sitting, Cuff Size: Large)   Pulse 71   Temp (!) 97.3 F (36.3 C) (Temporal)   Resp 18   Ht _0  (1.651 m)   Wt 233 lb 9.6 oz (106 kg)   SpO2 98%   BMI 38.87 kg/m  Wt Readings from Last 3 Encounters:  06/11/19 233 lb 9.6 oz (106 kg)  05/05/19 232 lb (105.2 kg)  04/01/19 241 lb (109.3 kg)     Lab Results  Component Value Date   WBC 5.2 11/27/2018   HGB 13.5 11/27/2018   HCT 41.6 11/27/2018   PLT 253 11/27/2018   GLUCOSE 103 (H) 12/11/2018   CHOL 203 (H) 12/11/2018   TRIG 162.0 (H) 12/11/2018   HDL 50.30 12/11/2018   LDLDIRECT 139.4 12/26/2011   LDLCALC 120 (H) 12/11/2018   ALT 32 12/11/2018   AST 24 12/11/2018   NA 138 12/11/2018   K 4.2 12/11/2018   CL 105 12/11/2018   CREATININE 0.85 12/11/2018   BUN 18 12/11/2018   CO2 26 12/11/2018   TSH 0.87 10/07/2018   INR 0.93 10/03/2016   HGBA1C 6.0 12/11/2018    Ct Angio Chest Pe W/cm &/or Wo Cm  Result Date: 11/27/2018 CLINICAL DATA:  Chest pain, shortness of breath EXAM: CT ANGIOGRAPHY CHEST WITH CONTRAST TECHNIQUE: Multidetector CT imaging of the chest was performed  using the standard protocol during bolus administration of intravenous contrast. Multiplanar CT image reconstructions and MIPs were obtained to evaluate the vascular anatomy. CONTRAST:  170m OMNIPAQUE IOHEXOL 350 MG/ML SOLN COMPARISON:  06/06/2018 FINDINGS: Cardiovascular: Satisfactory opacification of the pulmonary arteries to the segmental level. No evidence of pulmonary embolism. Normal heart size. No pericardial effusion. Mediastinum/Nodes: No enlarged mediastinal, hilar, or axillary lymph nodes. Thyroid gland, trachea, and esophagus demonstrate no significant findings. Lungs/Pleura: Lungs are clear. No pleural effusion or pneumothorax. Upper Abdomen: No acute abnormality. Musculoskeletal: No chest wall abnormality. No acute or significant osseous findings. Review of the MIP images confirms the above findings. IMPRESSION: Negative examination for pulmonary embolism. Electronically Signed   By: AEddie CandleM.D.   On: 11/27/2018 14:53   Dg Chest Port 1 View  Result Date: 11/27/2018 CLINICAL DATA:  Chest pain, shortness of breath EXAM: PORTABLE CHEST 1 VIEW COMPARISON:  08/22/2017 FINDINGS: Heart and mediastinal contours are within normal limits. No focal opacities or effusions. No acute bony abnormality. IMPRESSION: No active disease. Electronically Signed   By: KRolm BaptiseM.D.   On: 11/27/2018 13:03     Assessment & Plan:  Plan  I am having PRudene Christians"Emily Osborne" maintain her acetaminophen, FreeStyle [place], I.L.X. B-12, fluticasone, dexlansoprazole, atenolol, ALPRAZolam, albuterol, valACYclovir, azelastine, lidocaine, diclofenac sodium, famotidine, budesonide-formoterol, fexofenadine, FREESTYLE LITE, budesonide, metFORMIN, and Vitamin D (Ergocalciferol).  No orders of the defined types were placed in this encounter.   Problem List Items Addressed This Visit      Unprioritized   Essential hypertension    Well controlled, no changes to meds. Encouraged heart healthy diet such as the DASH diet  and exercise as tolerated.       Relevant Orders   Lipid panel   TSH  Hemoglobin A1c   Comprehensive metabolic panel   Insulin, random   Vitamin D (25 hydroxy)   Memory loss    mmse 30/30 Check labs  Pt thinks it may be from metformin ----  We can hold the metformin for a few weeks to see if it makes a difference       Morbid obesity due to excess calories ([place]) - Primary    con't with healthy weight and wellness      Relevant Orders   Lipid panel   TSH   Hemoglobin A1c   Comprehensive metabolic panel   Insulin, random   Vitamin D (25 hydroxy)   New daily persistent headache    Check labs See if ha get better with stopping metformin Consider mri / neuro referral if necessary      Relevant Orders   Sedimentation rate      Follow-up: Return in about 6 months (around 12/10/2019), or if symptoms worsen or fail to improve.  [name], DO

## 2019-06-12 LAB — INSULIN, RANDOM: Insulin: 13.4 u[IU]/mL

## 2019-06-17 IMAGING — CT CT ANGIOGRAPHY CHEST
1 of 8 series · 3 of 16 positions shown · IV contrast (omnipaque)
Comparison: 06/06/2018

CLINICAL DATA: Chest pain, shortness of breath

EXAM:
CT ANGIOGRAPHY CHEST WITH CONTRAST
TECHNIQUE: Multidetector CT imaging of the chest was performed using the
standard protocol during bolus administration of intravenous
contrast. Multiplanar CT image reconstructions and MIPs were
obtained to evaluate the vascular anatomy.
CONTRAST:  100mL OMNIPAQUE IOHEXOL 350 MG/ML SOLN

[Series 6: pe thins · axial · 0.80mm/px · z∈[-310,-174]mm · 3 of 364 slices shown]
[im 91/364  lung]
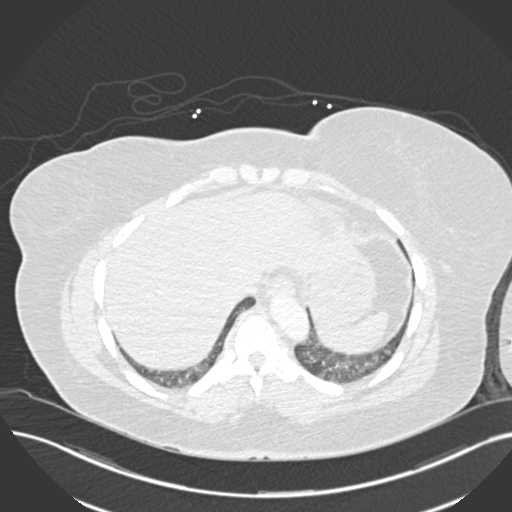
[im 182/364  soft-tissue]
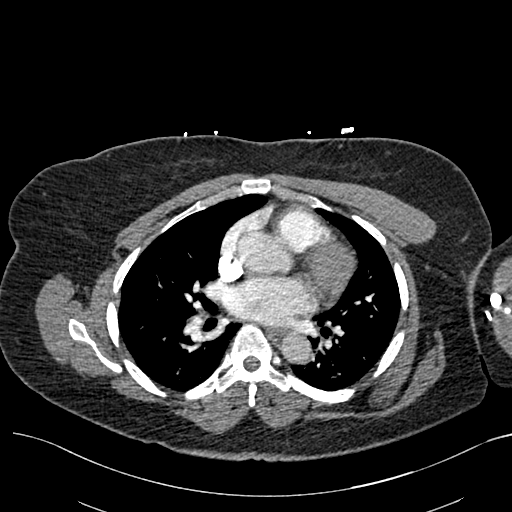
[im 273/364  lung]
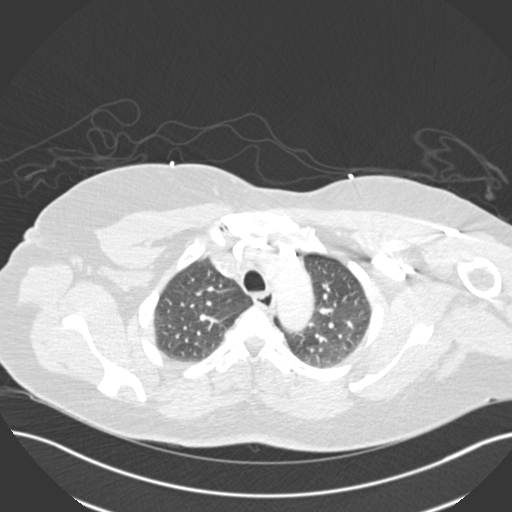

[3 of 16 positions shown; findings below may reference images not displayed]

FINDINGS: Cardiovascular: Satisfactory opacification of the pulmonary arteries
to the segmental level. No evidence of pulmonary embolism. Normal
heart size. No pericardial effusion.

Mediastinum/Nodes: No enlarged mediastinal, hilar, or axillary lymph
nodes. Thyroid gland, trachea, and esophagus demonstrate no
significant findings.

Lungs/Pleura: Lungs are clear. No pleural effusion or pneumothorax.

Upper Abdomen: No acute abnormality.

Musculoskeletal: No chest wall abnormality. No acute or significant
osseous findings.

Review of the MIP images confirms the above findings.
IMPRESSION: Negative examination for pulmonary embolism.

## 2019-07-01 ENCOUNTER — Ambulatory Visit (INDEPENDENT_AMBULATORY_CARE_PROVIDER_SITE_OTHER): Payer: 59 | Admitting: Physician Assistant

## 2019-07-28 ENCOUNTER — Telehealth: Payer: Self-pay | Admitting: Gastroenterology

## 2019-07-28 ENCOUNTER — Other Ambulatory Visit: Payer: Self-pay

## 2019-07-28 ENCOUNTER — Other Ambulatory Visit: Payer: Self-pay | Admitting: Gastroenterology

## 2019-07-28 DIAGNOSIS — K219 Gastro-esophageal reflux disease without esophagitis: Secondary | ICD-10-CM

## 2019-07-28 MED FILL — DEXILANT DR 60 MG CAPSULE: 60 | 90 days supply | Qty: 90 | Fill #0

## 2019-07-28 NOTE — Telephone Encounter (Signed)
Pt is requesting rf for dexilant sent to [facility].

## 2019-07-28 NOTE — Telephone Encounter (Signed)
Refill of Dexilant sent. Last seen 03-2019. Continue Dexilant.

## 2019-07-29 ENCOUNTER — Ambulatory Visit: Payer: 59 | Admitting: Internal Medicine

## 2019-07-29 ENCOUNTER — Other Ambulatory Visit: Payer: Self-pay | Admitting: *Deleted

## 2019-07-29 NOTE — Telephone Encounter (Signed)
Received request from [place] for lidocaine patch 5%. Last Rx was 08/27/18 and directions was to use for 1 week. Sent mychart message to pt for verification.

## 2019-08-03 NOTE — Progress Notes (Signed)
This encounter was created in error - please disregard.

## 2019-08-12 ENCOUNTER — Encounter: Payer: 59 | Admitting: Family Medicine

## 2019-08-12 ENCOUNTER — Other Ambulatory Visit: Payer: Self-pay | Admitting: Family Medicine

## 2019-08-12 DIAGNOSIS — E559 Vitamin D deficiency, unspecified: Secondary | ICD-10-CM

## 2019-08-12 DIAGNOSIS — J302 Other seasonal allergic rhinitis: Secondary | ICD-10-CM

## 2019-08-12 MED ORDER — LIDOCAINE 5 % EX PTCH: MEDICATED_PATCH | CUTANEOUS | 0 refills | Status: DC

## 2019-08-12 MED ORDER — FEXOFENADINE HCL 180 MG PO TABS: 180.0000 mg | ORAL_TABLET | Freq: Every day | ORAL | 1 refills | Status: DC

## 2019-08-12 MED ORDER — VITAMIN D (ERGOCALCIFEROL) 1.25 MG (50000 UNIT) PO CAPS: 50000.0000 [IU] | ORAL_CAPSULE | ORAL | 0 refills | Status: DC

## 2019-08-12 MED ORDER — FLUTICASONE PROPIONATE 50 MCG/ACT NA SUSP: NASAL | 1 refills | Status: DC

## 2019-08-12 MED FILL — LIDOCAINE PATCH 5%: 5 | 30 days supply | Qty: 30 | Fill #0

## 2019-08-12 MED FILL — FLUTICASONE PROP 50 MCG SPR: 50 | 30 days supply | Qty: 16 | Fill #0

## 2019-08-12 MED FILL — VIT D2 1.25 MG (50,000 UNIT: 1.25 MG | 28 days supply | Qty: 4 | Fill #0

## 2019-08-12 NOTE — Telephone Encounter (Signed)
Copied from [place]. Topic: Quick Communication - Rx Refill/Question >> Aug 12, 2019  8:06 AM Leward Quan A wrote: Medication:  lidocaine (LIDODERM) 5 %, fexofenadine (ALLEGRA) 180 MG tablet, fluticasone (FLONASE) 50 MCG/ACT nasal spray, Vitamin D, Ergocalciferol, (DRISDOL) 1.25 MG (50000 UT) CAPS capsule  Asking for Rx sent to pharmacy today please   Has the patient contacted their pharmacy? Yes.   (Agent: If no, request that the patient contact the pharmacy for the refill.) (Agent: If yes, when and what did the pharmacy advise?)  Preferred Pharmacy (with phone number or street name): [facility], [place]  Phone:  [phone] Fax:  [phone]     Agent: Please be advised that RX refills may take up to 3 business days. We ask that you follow-up with your pharmacy.

## 2019-08-12 NOTE — Telephone Encounter (Signed)
Requested medication (s) are due for refill today: yes  Requested medication (s) are on the active medication list: yes  Last refill:  05/27/2019  Future visit scheduled: yes  Notes to clinic:  last filled by different provider  Review for refill   Requested Prescriptions  Pending Prescriptions Disp Refills   Vitamin D, Ergocalciferol, (DRISDOL) 1.25 MG (50000 UT) CAPS capsule 4 capsule 0    Sig: Take 1 capsule (50,000 Units total) by mouth every 7 (seven) days.      Endocrinology:  Vitamins - Vitamin D Supplementation Failed - 08/12/2019  8:14 AM      Failed - 50,000 IU strengths are not delegated      Failed - Phosphate in normal range and within 360 days    No results found for: PHOS        Failed - Vitamin D in normal range and within 360 days    Vitamin D2 1, 25 (OH)2  Date Value Ref Range Status  09/07/2014 <8 pg/mL Final    Comment:    Vitamin D3, 1,25(OH)2 indicates both endogenous production and supplementation.  Vitamin D2, 1,25(OH)2 is an indicator of exogeous sources, such as diet or supplementation.  Interpretation and therapy are based on measurement of Vitamin D,1,25(OH)2, Total. This test was developed and its analytical performance characteristics have been determined by [facility], [place], [place]. It has not been cleared or approved by the FDA. This assay has been validated pursuant to the CLIA regulations and is used for clinical purposes.    Vitamin D3 1, 25 (OH)2  Date Value Ref Range Status  09/07/2014 51 pg/mL Final   Vitamin D 1, 25 (OH)2 Total  Date Value Ref Range Status  09/07/2014 51 18 - 72 pg/mL Final   VITD  Date Value Ref Range Status  06/11/2019 46.09 30.00 - 100.00 ng/mL Final          Passed - Ca in normal range and within 360 days    Calcium  Date Value Ref Range Status  06/11/2019 9.4 8.4 - 10.5 mg/dL Final          Passed - Valid encounter within last 12 months    Recent Outpatient Visits            2 months ago Morbid obesity due to excess calories ([place])   [facility] [place], DO   4 months ago Flank pain   [facility] at [place] R, DO   8 months ago Chest pain, unspecified type   [facility] at [place], DO   1 year ago Preventative health care   [place] [facility] [place], [place], DO   1 year ago Essential hypertension   [facility] at [place] R, DO       Future Appointments             In 1 week [place], [place] at [facility], [place]   In 1 month [place], Christena Deem, MD [facility]              lidocaine (LIDODERM) 5 % 30 patch 0    Sig: Apply to affected area QD PRN for a Week      Analgesics:  Topicals Passed -  08/12/2019  8:14 AM      Passed - Valid encounter within last 12 months    Recent Outpatient Visits           2 months ago Morbid obesity due to excess calories [facility])   [facility] at [place], DO   4 months ago Flank pain   [facility] at [place], DO   8 months ago Chest pain, unspecified type   [facility] at [place], DO   1 year ago Preventative health care   [place] [facility] [place], [place], DO   1 year ago Essential hypertension   [facility] at [place] R, DO       Future Appointments             In 1 week [place], [place] at [facility], [place]   In 1 month Tanda Rockers, MD [facility]             Signed Prescriptions  Disp Refills   fluticasone (FLONASE) 50 MCG/ACT nasal spray 16 g 1    Sig: PLACE 2 SPRAYS INTO BOTH NOSTRILS DAILY.      Ear, Nose, and Throat: Nasal Preparations - Corticosteroids Passed - 08/12/2019  8:14 AM      Passed - Valid encounter within last 12 months    Recent Outpatient Visits           2 months ago Morbid obesity due to excess calories ([place])   [facility] at [place], DO   4 months ago Flank pain   [facility] at [place] R, DO   8 months ago Chest pain, unspecified type   [facility] at [place], DO   1 year ago Preventative health care   [place] at [place], DO   1 year ago Essential hypertension   [facility] at [place] R, DO       Future Appointments             In 1 week [place], [place] at [facility], [place]   In 1 month Tanda Rockers, MD [facility]              fexofenadine (ALLEGRA) 180 MG tablet 90 tablet 1    Sig: Take 1 tablet (180 mg total) by mouth daily.      Ear, Nose, and Throat:  Antihistamines Passed - 08/12/2019  8:14 AM      Passed - Valid encounter within last 12 months    Recent Outpatient Visits           2 months ago Morbid obesity due to excess calories [facility])   [facility] at [place], DO   4 months ago Flank pain   [facility] at [place], DO   8 months ago Chest pain, unspecified type   [facility] at [place],  DO   1 year ago Preventative health care   [place] at [place], [place], DO   1 year ago Essential  hypertension   [facility] at [place], DO       Future Appointments             In 1 week [place], DO [employer] [facility] at [facility], [place]   In 1 month Melvyn Novas, Christena Deem, MD [facility]

## 2019-08-12 NOTE — Telephone Encounter (Signed)
Vitamin d level 06/11/19 was 46.09.  do you want to continue prescription strength vit d?  Also do you want to continue lidoderm patches?

## 2019-08-18 ENCOUNTER — Encounter: Payer: Self-pay | Admitting: Family Medicine

## 2019-08-25 ENCOUNTER — Encounter: Payer: 59 | Admitting: Family Medicine

## 2019-08-28 ENCOUNTER — Other Ambulatory Visit: Payer: Self-pay

## 2019-08-31 ENCOUNTER — Other Ambulatory Visit: Payer: Self-pay

## 2019-08-31 ENCOUNTER — Encounter: Payer: Self-pay | Admitting: Family Medicine

## 2019-08-31 ENCOUNTER — Ambulatory Visit (INDEPENDENT_AMBULATORY_CARE_PROVIDER_SITE_OTHER): Payer: 59 | Admitting: Family Medicine

## 2019-08-31 VITALS — BP 120/82 | HR 67 | Temp 97.7°F | Resp 18 | Ht 65.0 in | Wt 232.0 lb

## 2019-08-31 DIAGNOSIS — E785 Hyperlipidemia, unspecified: Secondary | ICD-10-CM

## 2019-08-31 DIAGNOSIS — E2839 Other primary ovarian failure: Secondary | ICD-10-CM | POA: Diagnosis not present

## 2019-08-31 DIAGNOSIS — Z0000 Encounter for general adult medical examination without abnormal findings: Secondary | ICD-10-CM

## 2019-08-31 LAB — COMPREHENSIVE METABOLIC PANEL
ALT: 31 U/L (ref 0–35)
AST: 26 U/L (ref 0–37)
Albumin: 4.3 g/dL (ref 3.5–5.2)
Alkaline Phosphatase: 66 U/L (ref 39–117)
BUN: 14 mg/dL (ref 6–23)
CO2: 31 mEq/L (ref 19–32)
Calcium: 9.6 mg/dL (ref 8.4–10.5)
Chloride: 106 mEq/L (ref 96–112)
Creatinine, Ser: 0.88 mg/dL (ref 0.40–1.20)
GFR: 79.41 mL/min (ref >60.00)
Glucose, Bld: 85 mg/dL (ref 70–99)
Potassium: 3.8 mEq/L (ref 3.5–5.1)
Sodium: 142 mEq/L (ref 135–145)
Total Bilirubin: 0.6 mg/dL (ref 0.2–1.2)
Total Protein: 7 g/dL (ref 6.0–8.3)

## 2019-08-31 LAB — LIPID PANEL
Cholesterol: 198 mg/dL (ref 0–200)
HDL: 58.6 mg/dL (ref >39.00)
LDL Cholesterol: 117 mg/dL — ABNORMAL HIGH (ref 0–99)
NonHDL: 139.64
Total CHOL/HDL Ratio: 3
Triglycerides: 111 mg/dL (ref 0.0–149.0)
VLDL: 22.2 mg/dL (ref 0.0–40.0)

## 2019-08-31 LAB — CBC WITH DIFFERENTIAL/PLATELET
Basophils Absolute: 0 10*3/uL (ref 0.0–0.1)
Basophils Relative: 0.4 % (ref 0.0–3.0)
Eosinophils Absolute: 0.1 10*3/uL (ref 0.0–0.7)
Eosinophils Relative: 2.2 % (ref 0.0–5.0)
HCT: 39.9 % (ref 36.0–46.0)
Hemoglobin: 13 g/dL (ref 12.0–15.0)
Lymphocytes Relative: 49.8 % — ABNORMAL HIGH (ref 12.0–46.0)
Lymphs Abs: 2.7 10*3/uL (ref 0.7–4.0)
MCHC: 32.5 g/dL (ref 30.0–36.0)
MCV: 89.9 fl (ref 78.0–100.0)
Monocytes Absolute: 0.5 10*3/uL (ref 0.1–1.0)
Monocytes Relative: 9.9 % (ref 3.0–12.0)
Neutro Abs: 2 10*3/uL (ref 1.4–7.7)
Neutrophils Relative %: 37.7 % — ABNORMAL LOW (ref 43.0–77.0)
Platelets: 238 10*3/uL (ref 150.0–400.0)
RBC: 4.44 Mil/uL (ref 3.87–5.11)
RDW: 15.7 % — ABNORMAL HIGH (ref 11.5–15.5)
WBC: 5.4 10*3/uL (ref 4.0–10.5)

## 2019-08-31 LAB — HEMOGLOBIN A1C: Hgb A1c MFr Bld: 5.8 % (ref 4.6–6.5)

## 2019-08-31 LAB — MICROALBUMIN / CREATININE URINE RATIO
Creatinine,U: 40.9 mg/dL
Microalb Creat Ratio: 1.7 mg/g (ref 0.0–30.0)
Microalb, Ur: <0.7 mg/dL (ref 0.0–1.9)

## 2019-08-31 NOTE — Progress Notes (Signed)
Subjective:     Emily Osborne is a 59 y.o. female and is here for a comprehensive physical exam. The patient reports no problems.  Social History   Socioeconomic History  . Marital status: Divorced    Spouse name: Not on file  . Number of children: 2  . Years of education: Not on file  . Highest education level: Not on file  Occupational History  . Occupation: Nurse/teacher  Tobacco Use  . Smoking status: Never Smoker  . Smokeless tobacco: Never Used  Substance and Sexual Activity  . Alcohol use: No    Alcohol/week: 0.0 standard drinks  . Drug use: No  . Sexual activity: Not Currently    Partners: Male  Other Topics Concern  . Not on file  Social History Narrative   Lives with alone.  She has two daughter.   She works as a Programme researcher, broadcasting/film/video at [employer]--- no   Social Determinants of Radio broadcast assistant Strain:   . Difficulty of Paying Living Expenses: Not on file  Food Insecurity:   . Worried About Charity fundraiser in the Last Year: Not on file  . Ran Out of Food in the Last Year: Not on file  Transportation Needs:   . Lack of Transportation (Medical): Not on file  . Lack of Transportation (Non-Medical): Not on file  Physical Activity:   . Days of Exercise per Week: Not on file  . Minutes of Exercise per Session: Not on file  Stress:   . Feeling of Stress : Not on file  Social Connections:   . Frequency of Communication with Friends and Family: Not on file  . Frequency of Social Gatherings with Friends and Family: Not on file  . Attends Religious Services: Not on file  . Active Member of Clubs or Organizations: Not on file  . Attends Archivist Meetings: Not on file  . Marital Status: Not on file  Intimate Partner Violence:   . Fear of Current or Ex-Partner: Not on file  . Emotionally Abused: Not on file  . Physically Abused: Not on file  . Sexually Abused: Not on file   Health Maintenance  Topic Date Due  . FOOT EXAM   01/17/1970  . OPHTHALMOLOGY EXAM  01/17/1970  . URINE MICROALBUMIN  01/17/1970  . MAMMOGRAM  04/24/2019  . HEMOGLOBIN A1C  12/10/2019  . PAP SMEAR-Modifier  08/07/2021  . COLONOSCOPY  10/17/2023  . TETANUS/TDAP  07/02/2027  . PNEUMOCOCCAL POLYSACCHARIDE VACCINE AGE 37-64 HIGH RISK  Completed  . Hepatitis C Screening  Completed  . HIV Screening  Completed    The following portions of the patient's history were reviewed and updated as appropriate: She  has a past medical history of Allergy, Anxiety, Asthma, Back pain, C. difficile colitis, Cataract, Chest pain, Chronic fatigue syndrome, Clostridium difficile colitis (12/13/2016), Diverticulitis, Dyspnea, Dysrhythmia, Edema, Elevated blood pressure reading without diagnosis of hypertension, Fatty liver, Fibromyalgia, Ganglion of joint, GERD (gastroesophageal reflux disease), H. pylori infection, Headache, Helicobacter pylori (H. pylori) infection (12/13/2016), Herpes zoster without mention of complication, HTN (hypertension), Hyperlipidemia, Hyperthyroidism, IBS (irritable bowel syndrome), Leg edema, Multiple drug allergies (06/18/2017), Myalgia, Nontraumatic rupture of Achilles tendon, Other acute reactions to stress, Pain in joint, shoulder region, Pain in joint, upper arm, Pain in limb, Palpitations, Personal history of other diseases of digestive system, Pneumonia, PONV (postoperative nausea and vomiting), Routine screening for STI (sexually transmitted infection) (12/13/2016), S/P partial colectomy (06/18/2017), Thyroid disease, Type 2  diabetes mellitus with hyperglycemia, without long-term current use of insulin ([place]) (08/07/2018), and Vitamin B 12 deficiency. She does not have any pertinent problems on file. She  has a past surgical history that includes Abdominal hysterectomy; Tubal ligation; Bunionectomy (Bilateral); Achilles tendon repair (Right, 2005); Ganglion cyst excision (Right); Meniscus repair (Right, 04/2014); wisdomteeth extraction; LEFT  HEART CATH AND CORONARY ANGIOGRAPHY (N/A, 10/03/2016); Colon surgery (01/23/2017); Colonoscopy; and Polypectomy. Her family history includes Cancer in her brother; Coronary artery disease in her brother; Diabetes in her father; Heart disease in her father and mother; Hyperlipidemia in her father and mother; Hypertension in her brother, father, and mother; Kidney disease in her father; Leukemia in her brother; Lupus in her daughter; Obesity in her mother; Pernicious anemia in her sister; Thyroid disease in her mother and sister. She  reports that she has never smoked. She has never used smokeless tobacco. She reports that she does not drink alcohol or use drugs. She has a current medication list which includes the following prescription(s): acetaminophen, albuterol, alprazolam, azelastine, freestyle freedom, budesonide-formoterol, dexilant, diclofenac sodium, famotidine, fexofenadine, fluticasone, freestyle lite, i.l.x. b-12, lidocaine, valacyclovir, and vitamin d (ergocalciferol). Current Outpatient Medications on File Prior to Visit  Medication Sig Dispense Refill  . acetaminophen (TYLENOL) 500 MG tablet Take 1,000 mg by mouth every 6 (six) hours as needed for moderate pain.     Marland Kitchen albuterol (PROAIR HFA) 108 (90 Base) MCG/ACT inhaler USE 2 PUFFS EVERY 4 HOURS AS NEEDED FOR COUGH, WHEEZE OR SHORTNESS OF BREATH. 8.5 each 2  . ALPRAZolam (XANAX) 0.25 MG tablet Take 1 tablet (0.25 mg total) by mouth 3 (three) times daily as needed. 30 tablet 2  . azelastine (OPTIVAR) 0.05 % ophthalmic solution Place 1 drop into both eyes 2 (two) times daily as needed.    . Blood Glucose Monitoring Suppl (FREESTYLE FREEDOM) KIT 1 Device by Does not apply route daily. 1 each 0  . budesonide-formoterol (SYMBICORT) 80-4.5 MCG/ACT inhaler Take 2 puffs first thing in am and then another 2 puffs about 12 hours later. 1 Inhaler 11  . DEXILANT 60 MG capsule TAKE 1 CAPSULE (60 MG TOTAL) BY MOUTH DAILY. 90 capsule 2  . diclofenac  sodium (VOLTAREN) 1 % GEL Apply 2 g topically 4 (four) times daily. Rub into affected area of foot 2 to 4 times daily 100 g 2  . famotidine (PEPCID) 20 MG tablet One after supper 30 tablet 11  . fexofenadine (ALLEGRA) 180 MG tablet Take 1 tablet (180 mg total) by mouth daily. 90 tablet 1  . fluticasone (FLONASE) 50 MCG/ACT nasal spray PLACE 2 SPRAYS INTO BOTH NOSTRILS DAILY. 16 g 1  . glucose blood (FREESTYLE LITE) test strip USE TO CHECK BLOOD SUGAR ONCE A DAY 100 each 1  . Iron Combinations (I.L.X. B-12) ELIX Take 1 each by mouth daily as needed (energy). 1 droplet full    . lidocaine (LIDODERM) 5 % Apply to affected area QD PRN for a Week 30 patch 0  . valACYclovir (VALTREX) 1000 MG tablet Take 1,000 mg by mouth 3 (three) times daily as needed.    . Vitamin D, Ergocalciferol, (DRISDOL) 1.25 MG (50000 UT) CAPS capsule Take 1 capsule (50,000 Units total) by mouth every 7 (seven) days. 4 capsule 0   No current facility-administered medications on file prior to visit.   She is allergic to aspirin; gentamicin; ibuprofen; metronidazole; nsaids; doxycycline; influenza a (h1n1) monoval vac; loratadine; periactin [cyproheptadine]; ranitidine hcl; sulfamethoxazole-trimethoprim; and tramadol hcl..  Review of Systems Review  of Systems  Constitutional: Negative for activity change, appetite change and fatigue.  HENT: Negative for hearing loss, congestion, tinnitus and ear discharge.  dentist q7mEyes: Negative for visual disturbance (see optho q1y -- vision corrected to 20/20 with glasses).  Respiratory: Negative for cough, chest tightness and shortness of breath.   Cardiovascular: Negative for chest pain, palpitations and leg swelling.  Gastrointestinal: Negative for abdominal pain, diarrhea, constipation and abdominal distention.  Genitourinary: Negative for urgency, frequency, decreased urine volume and difficulty urinating.  Musculoskeletal: Negative for back pain, arthralgias and gait problem.   Skin: Negative for color change, pallor and rash.  Neurological: Negative for dizziness, light-headedness, numbness and headaches.  Hematological: Negative for adenopathy. Does not bruise/bleed easily.  Psychiatric/Behavioral: Negative for suicidal ideas, confusion, sleep disturbance, self-injury, dysphoric mood, decreased concentration and agitation.       Objective:    BP 120/82 (BP Location: Left Arm, Patient Position: Sitting, Cuff Size: Large)   Pulse 67   Temp 97.7 F (36.5 C) (Temporal)   Resp 18   Ht 5' 5"  (1.651 m)   Wt 232 lb (105.2 kg)   SpO2 98%   BMI 38.61 kg/m  General appearance: alert, cooperative, appears stated age and no distress Head: Normocephalic, without obvious abnormality, atraumatic Eyes: negative findings: lids and lashes normal, conjunctivae and sclerae normal and pupils equal, round, reactive to light and accomodation Ears: normal TM's and external ear canals both ears Nose: Nares normal. Septum midline. Mucosa normal. No drainage or sinus tenderness. Throat: lips, mucosa, and tongue normal; teeth and gums normal Neck: no adenopathy, no carotid bruit, no JVD, supple, symmetrical, trachea midline and thyroid not enlarged, symmetric, no tenderness/mass/nodules Back: symmetric, no curvature. ROM normal. No CVA tenderness. Lungs: clear to auscultation bilaterally Breasts: normal appearance, no masses or tenderness Heart: regular rate and rhythm, S1, S2 normal, no murmur, click, rub or gallop Abdomen: soft, non-tender; bowel sounds normal; no masses,  no organomegaly Pelvic: deferred--s/p TAH Extremities: extremities normal, atraumatic, no cyanosis or edema Pulses: 2+ and symmetric Skin: Skin color, texture, turgor normal. No rashes or lesions Lymph nodes: Cervical, supraclavicular, and axillary nodes normal. Neurologic: Mental status: Alert, oriented, thought content appropriate    Assessment:    Healthy female exam.      Plan:    ghm  utd Check labs  See After Visit Summary for Counseling Recommendations    1. Hyperlipidemia, unspecified hyperlipidemia type Encouraged heart healthy diet, increase exercise, avoid trans fats, consider a krill oil cap daily - Lipid panel - Hemoglobin A1c - CBC with Differential - Comprehensive metabolic panel  2. Morbid obesity ([facility] Encouraged diet and exercise  - Lipid panel - Hemoglobin A1c - CBC with Differential - Comprehensive metabolic panel - Microalbumin / creatinine urine ratio  3. Estrogen deficiency   - DG Bone Density; Future

## 2019-08-31 NOTE — Patient Instructions (Signed)

## 2019-09-08 MED FILL — FLUTICASONE PROP 50 MCG SPR: 50 | 30 days supply | Qty: 16 | Fill #1

## 2019-09-08 MED FILL — FEXOFENADINE HCL 180 MG TAB: 180 | 90 days supply | Qty: 90 | Fill #2

## 2019-09-21 ENCOUNTER — Other Ambulatory Visit (HOSPITAL_COMMUNITY)
Admission: RE | Admit: 2019-09-21 | Discharge: 2019-09-21 | Disposition: A | Discharge status: Home / Self Care | Payer: 59 | Source: Ambulatory Visit | Attending: Internal Medicine | Admitting: Internal Medicine

## 2019-09-21 DIAGNOSIS — Z20822 Contact with and (suspected) exposure to covid-19: Secondary | ICD-10-CM | POA: Diagnosis not present

## 2019-09-21 DIAGNOSIS — Z01812 Encounter for preprocedural laboratory examination: Secondary | ICD-10-CM | POA: Diagnosis not present

## 2019-09-21 LAB — SARS CORONAVIRUS 2 (TAT 6-24 HRS): SARS Coronavirus 2: NEGATIVE

## 2019-09-25 ENCOUNTER — Other Ambulatory Visit: Payer: Self-pay

## 2019-09-25 ENCOUNTER — Ambulatory Visit: Payer: 59 | Admitting: Internal Medicine

## 2019-09-25 ENCOUNTER — Encounter: Payer: Self-pay | Admitting: Internal Medicine

## 2019-09-25 ENCOUNTER — Ambulatory Visit (INDEPENDENT_AMBULATORY_CARE_PROVIDER_SITE_OTHER): Payer: 59 | Admitting: Internal Medicine

## 2019-09-25 DIAGNOSIS — E669 Obesity, unspecified: Secondary | ICD-10-CM | POA: Diagnosis not present

## 2019-09-25 DIAGNOSIS — R058 Other specified cough: Secondary | ICD-10-CM

## 2019-09-25 DIAGNOSIS — R05 Cough: Secondary | ICD-10-CM

## 2019-09-25 LAB — PULMONARY FUNCTION TEST
DL/VA % pred: 129 %
DL/VA: 5.42 ml/min/mmHg/L
DLCO unc % pred: 105 %
DLCO unc: 22.26 ml/min/mmHg
FEF 25-75 Post: 1.35 L/sec
FEF 25-75 Pre: 2.93 L/sec
FEF2575-%Change-Post: -53 %
FEF2575-%Pred-Post: 61 %
FEF2575-%Pred-Pre: 134 %
FEV1-%Change-Post: -7 %
FEV1-%Pred-Post: 84 %
FEV1-%Pred-Pre: 91 %
FEV1-Post: 1.87 L
FEV1-Pre: 2.01 L
FEV1FVC-%Change-Post: 7 %
FEV1FVC-%Pred-Pre: 104 %
FEV6-%Change-Post: -12 %
FEV6-%Pred-Post: 76 %
FEV6-%Pred-Pre: 87 %
FEV6-Post: 2.08 L
FEV6-Pre: 2.38 L
FEV6FVC-%Pred-Post: 103 %
FEV6FVC-%Pred-Pre: 103 %
FVC-%Change-Post: -13 %
FVC-%Pred-Post: 74 %
FVC-%Pred-Pre: 86 %
FVC-Post: 2.08 L
FVC-Pre: 2.41 L
Post FEV1/FVC ratio: 90 %
Post FEV6/FVC ratio: 100 %
Pre FEV1/FVC ratio: 83 %
Pre FEV6/FVC Ratio: 100 %
RV % pred: 77 %
RV: 1.56 L
TLC % pred: 85 %
TLC: 4.43 L

## 2019-09-25 NOTE — Progress Notes (Signed)
PFT done today. 

## 2019-09-25 NOTE — Progress Notes (Signed)
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 onset in teenage years   variable doe/subjective wheeze/ chronic non-cardiac cp much worse sob x 6 weeks and lives with daughter from [place] who tested positive for COVID-19 but this was not disclosed prior to her arrival here as pt says "daughter's been fine since about 5/9 or 5/10 and "I don't think I have it "    referred to pulmonary clinic 01/01/2019 by Dr   Laury Axon for sob      History of Present Illness  01/01/2019  Pulmonary/ 1st office eval/Emily Osborne / maint on advair 250 dpi  Chief Complaint  Patient presents with  . Pulmonary Consult    Referred by Dr Laury Axon- SOB since end of April. She has cough- non prod. She has had sweats but no fever.   Dyspnea: "only when I walk" but says  walked a mile 12/29/2018 Cough: dry daytime/ urge to clear throat is the predominant problem   saba doesn't really help rec Dexilant 60 mg    Take  30-60 min before first meal of the day and Pepcid (famotidine)  20 mg one after supper  until return to office - this is the best way to tell whether stomach acid is contributing to your problem.   GERD diet  Plan A = Automatic = stop advair and start symbicort 160 Take 2 puffs first thing in am and then another 2 puffs about 12 hours later.  Work on inhaler technique:  Plan B = Backup Only use your albuterol inhaler as a rescue medication For cough try tessalon 200 mg every 6 hours as needed  No work until return in 2 weeks and we do a  Covid Study prior to your visit  >>>> neg  01/13/2019    01/15/2019  f/u ov/Emily Osborne re: asthma symb 160 only using one puff  bid  Chief Complaint  Patient presents with  . Follow-up    Breathing is much improved. No co's. She has only used her albuterol once since the last visit.    Dyspnea:  Walked x 2 miles x 2 d prior to OV  Stopped due to fatigue,  not sob  Cough: none Sleeping: no resp c/o's / on side/bed flat, two pillows  SABA use: x one since last ov 02: none  rec Change symbicort 80 Take 2 puffs first thing in am and then another 2 puffs about 12 hours later.  Work on inhaler technique:     Only use your albuterol as a rescue medication    09/25/2019  f/u ov/Emily Osborne re:  Asthma on symb 80 2bid  Chief Complaint  Patient presents with  . Follow-up    PFT's done today. Breathing has overall doing well. She rarely uses albuterol.    Dyspnea:  Only sob across long  parking lots Cough: none Sleeping: flat one  pillows p pepcid hs  SABA use: rarely 02: none    No obvious day to day or daytime variability or assoc excess/ purulent sputum or mucus plugs or hemoptysis or cp or chest tightness, subjective wheeze or overt sinus or hb symptoms.   Sleeping well  without nocturnal  or early am exacerbation  of respiratory  c/o's or need for noct saba. Also denies any obvious fluctuation of symptoms with weather or environmental changes or other  aggravating or alleviating factors except as outlined above   No unusual exposure hx or h/o childhood pna/ asthma or knowledge of premature birth.  Current Allergies, Complete Past Medical History, Past Surgical History, Family History, and Social History were reviewed in [employer] record.  ROS  The following are not active complaints unless bolded Hoarseness, sore throat, dysphagia, dental problems, itching, sneezing,  nasal congestion or discharge of excess mucus or purulent secretions, ear ache,   fever, chills, sweats, unintended wt loss or wt gain, classically pleuritic or exertional cp,  orthopnea pnd or arm/hand swelling  or leg swelling, presyncope, palpitations, abdominal pain, anorexia, nausea, vomiting, diarrhea  or change in bowel habits or change in bladder habits, change in stools or change in urine, dysuria, hematuria,  rash, arthralgias, visual complaints,  headache, numbness, weakness or ataxia or problems with walking or coordination,  change in mood or  memory.        Current Meds  Medication Sig  . acetaminophen (TYLENOL) 500 MG tablet Take 1,000 mg by mouth every 6 (six) hours as needed for moderate pain.   Marland Kitchen albuterol (PROAIR HFA) 108 (90 Base) MCG/ACT inhaler USE 2 PUFFS EVERY 4 HOURS AS NEEDED FOR COUGH, WHEEZE OR SHORTNESS OF BREATH.  Marland Kitchen ALPRAZolam (XANAX) 0.25 MG tablet Take 1 tablet (0.25 mg total) by mouth 3 (three) times daily as needed.  Marland Kitchen azelastine (OPTIVAR) 0.05 % ophthalmic solution Place 1 drop into both eyes 2 (two) times daily as needed.  . Blood Glucose Monitoring Suppl (FREESTYLE FREEDOM) KIT 1 Device by Does not apply route daily.  . budesonide-formoterol (SYMBICORT) 80-4.5 MCG/ACT inhaler Take 2 puffs first thing in am and then another 2 puffs about 12 hours later.  Marland Kitchen DEXILANT 60 MG capsule TAKE 1 CAPSULE (60 MG TOTAL) BY MOUTH DAILY.  Marland Kitchen diclofenac sodium (VOLTAREN) 1 % GEL Apply 2 g topically 4 (four) times daily. Rub into affected area of foot 2 to 4 times daily  . famotidine (PEPCID) 20 MG tablet One after supper  . fexofenadine (ALLEGRA) 180 MG tablet Take 1 tablet (180 mg total) by mouth daily.  . fluticasone (FLONASE) 50 MCG/ACT nasal spray PLACE 2 SPRAYS INTO BOTH NOSTRILS DAILY.  Marland Kitchen glucose blood (FREESTYLE LITE) test strip USE TO CHECK BLOOD SUGAR ONCE A DAY  . Iron Combinations (I.L.X. B-12) ELIX Take 1 each by mouth daily as needed (energy). 1 droplet full  . lidocaine (LIDODERM) 5 % Apply to affected area QD PRN for a Week  . valACYclovir (VALTREX) 1000 MG tablet Take 1,000 mg by mouth 3 (three) times daily as needed.  . Vitamin D, Ergocalciferol, (DRISDOL) 1.25 MG (50000 UT) CAPS capsule Take 1 capsule (50,000 Units total) by mouth every 7 (seven) days.                  Past Medical History:  Diagnosis Date  . Allergy   . Anxiety   . Asthma    pt has inhaler  . Back pain   . C. difficile colitis     . Cataract    bil eyes  . Chest pain   . Chronic fatigue syndrome   . Clostridium difficile colitis 12/13/2016  . Diverticulitis   . Dyspnea   . Dysrhythmia    palpitations  . Edema   . Elevated blood pressure reading without diagnosis of hypertension    "just elevated when I'm in pain" (12/07/2015)  . Fatty liver   . Fibromyalgia   .  Ganglion of joint    right wrist  . GERD (gastroesophageal reflux disease)   . H. pylori infection   . Headache    hx of  . Helicobacter pylori (H. pylori) infection 12/13/2016  . Herpes zoster without mention of complication   . HTN (hypertension)   . Hyperlipidemia   . Hyperthyroidism   . IBS (irritable bowel syndrome)   . Leg edema   . Multiple drug allergies 06/18/2017  . Myalgia   . Nontraumatic rupture of Achilles tendon   . Other acute reactions to stress   . Pain in joint, shoulder region   . Pain in joint, upper arm   . Pain in limb   . Palpitations   . Personal history of other diseases of digestive system   . Pneumonia    once  . PONV (postoperative nausea and vomiting)   . Routine screening for STI (sexually transmitted infection) 12/13/2016  . S/P partial colectomy 06/18/2017  . Thyroid disease    hyperthyroidism  . Type 2 diabetes mellitus with hyperglycemia, without long-term current use of insulin ([facility]) 08/07/2018  . Vitamin B 12 deficiency          Objective:    Pleasant amb mod obese bf nad   09/25/2019         233   01/15/19 237 lb (107.5 kg)  01/01/19 235 lb (106.6 kg)  12/23/18 233 lb 9.6 oz (106 kg)    Vital signs reviewed  09/25/2019  - Note at rest 02 sats  100% on RA        HEENT : pt wearing mask not removed for exam due to covid -19 concerns.    NECK :  without JVD/Nodes/TM/ nl carotid upstrokes bilaterally   LUNGS: no acc muscle use,  Nl contour chest which is clear to A and P bilaterally without cough on insp or exp maneuvers   CV:  RRR  no s3 or murmur or increase in P2, and no edema   ABD:  Obese/ but  soft and nontender with nl inspiratory excursion in the supine position. No bruits or organomegaly appreciated, bowel sounds nl  MS:  Nl gait/ ext warm without deformities, calf tenderness, cyanosis or clubbing No obvious joint restrictions   SKIN: warm and dry without lesions    NEURO:  alert, approp, nl sensorium with  no motor or cerebellar deficits apparent.        I personally reviewed images and agree with radiology impression as follows:   Chest CTa 11/27/2018 wnl         Assessment

## 2019-09-25 NOTE — Patient Instructions (Signed)
Weight control is simply a matter of calorie balance which needs to be tilted in your favor by eating less and exercising more.  To get the most out of exercise, you need to be continuously aware that you are short of breath, but never out of breath, for 30 minutes daily. As you improve, it will actually be easier for you to do the same amount of exercise  in  30 minutes so always push to the level where you are short of breath.  If this does not result in gradual weight reduction then I strongly recommend you see a nutritionist with a food diary x 2 weeks so that we can work out a negative calorie balance which is universally effective in steady weight loss programs.  Think of your calorie balance like you do your bank account where in this case you want the balance to go down so you must take in less calories than you burn up.  It's just that simple:  Hard to do, but easy to understand.  Good luck!     If you are satisfied with your treatment plan,  let your doctor know and he/she can either refill your medications or you can return here when your prescription runs out.     If in any way you are not 100% satisfied,  please tell us.  If 100% better, tell your friends!  Pulmonary follow up is as needed

## 2019-09-27 ENCOUNTER — Encounter: Payer: Self-pay | Admitting: Internal Medicine

## 2019-09-27 NOTE — Assessment & Plan Note (Signed)
PFT's 09/25/2019 wnl except for ERV 31% c/w body habitus  Body mass index is 38.81 kg/m.  -  trending down/ encouraged  Lab Results  Component Value Date   TSH 0.49 06/11/2019     Contributing to gerd risk/ doe/reviewed the need and the process to achieve and maintain neg calorie balance > defer f/u primary care including intermittently monitoring thyroid status           Each maintenance medication was reviewed in detail including emphasizing most importantly the difference between maintenance and prns and under what circumstances the prns are to be triggered using an action plan format where appropriate.  Total time for H and P, chart review, counseling re pfts and wt loss issues   and generating customized AVS unique to this office visit / charting = 30 min

## 2019-09-27 NOTE — Assessment & Plan Note (Signed)
Flared early April 2020 while on Advair 250 - d/c 01/01/2019  - 01/15/2019  After extensive coaching inhaler device,  effectiveness =    50% and doing better on symb 160 1 bid so try symb 80 2bid > sustained improvement and nl pfts  All goals of chronic asthma control met including optimal function and elimination of symptoms with minimal need for rescue therapy.  Contingencies discussed in full including contacting this office immediately if not controlling the symptoms using the rule of two's.       

## 2019-09-30 DIAGNOSIS — H5213 Myopia, bilateral: Secondary | ICD-10-CM | POA: Diagnosis not present

## 2019-09-30 DIAGNOSIS — H04129 Dry eye syndrome of unspecified lacrimal gland: Secondary | ICD-10-CM | POA: Diagnosis not present

## 2019-09-30 DIAGNOSIS — H52221 Regular astigmatism, right eye: Secondary | ICD-10-CM | POA: Diagnosis not present

## 2019-09-30 DIAGNOSIS — H2513 Age-related nuclear cataract, bilateral: Secondary | ICD-10-CM | POA: Diagnosis not present

## 2019-10-02 ENCOUNTER — Other Ambulatory Visit: Payer: Self-pay | Admitting: Family Medicine

## 2019-10-02 DIAGNOSIS — Z1231 Encounter for screening mammogram for malignant neoplasm of breast: Secondary | ICD-10-CM

## 2019-10-12 MED FILL — HYDROCORTISONE 2.5% OINT: 2.5 | 14 days supply | Qty: 28 | Fill #0

## 2019-10-13 ENCOUNTER — Ambulatory Visit: Payer: 59 | Admitting: Internal Medicine

## 2019-10-20 ENCOUNTER — Other Ambulatory Visit: Payer: Self-pay | Admitting: Family Medicine

## 2019-10-20 DIAGNOSIS — H2513 Age-related nuclear cataract, bilateral: Secondary | ICD-10-CM | POA: Diagnosis not present

## 2019-10-20 DIAGNOSIS — J302 Other seasonal allergic rhinitis: Secondary | ICD-10-CM

## 2019-10-20 DIAGNOSIS — H00022 Hordeolum internum right lower eyelid: Secondary | ICD-10-CM | POA: Diagnosis not present

## 2019-10-20 MED FILL — ERYTHROMYCIN EYE OINTMENT: 5 | 7 days supply | Qty: 4 | Fill #0

## 2019-10-20 MED FILL — CEPHALEXIN 500 MG CAPSULE: 500 | 10 days supply | Qty: 20 | Fill #0

## 2019-10-21 MED FILL — FLUTICASONE PROP 50 MCG SPR: 50 | 30 days supply | Qty: 16 | Fill #0

## 2019-11-05 ENCOUNTER — Other Ambulatory Visit: Payer: Self-pay

## 2019-11-05 ENCOUNTER — Other Ambulatory Visit: Payer: Self-pay | Admitting: Family Medicine

## 2019-11-05 DIAGNOSIS — E559 Vitamin D deficiency, unspecified: Secondary | ICD-10-CM

## 2019-11-05 DIAGNOSIS — F419 Anxiety disorder, unspecified: Secondary | ICD-10-CM

## 2019-11-05 MED ORDER — VITAMIN D (ERGOCALCIFEROL) 1.25 MG (50000 UNIT) PO CAPS: 50000.0000 [IU] | ORAL_CAPSULE | ORAL | 0 refills | Status: DC

## 2019-11-05 MED FILL — ALPRAZolam 0.25 MG TABS: 0.25 | 10 days supply | Qty: 30 | Fill #0

## 2019-11-05 MED FILL — VIT D2 1.25 MG (50,000 UNIT: 1.25 MG | 28 days supply | Qty: 4 | Fill #0

## 2019-11-05 NOTE — Telephone Encounter (Signed)
Requesting: alprazolam Contract:10/11/17 UDS:n/a Last Visit:08/31/19 Next Visit:03/01/20 Last Refill:07/29/18(30,2)  Please Advise. Medication pending. Refill has been sent already for vitamin d capsules.

## 2019-11-26 ENCOUNTER — Telehealth: Payer: Self-pay | Admitting: Gastroenterology

## 2019-11-26 ENCOUNTER — Other Ambulatory Visit: Payer: Self-pay

## 2019-11-26 DIAGNOSIS — K219 Gastro-esophageal reflux disease without esophagitis: Secondary | ICD-10-CM

## 2019-11-26 MED ORDER — DEXILANT 60 MG PO CPDR: DELAYED_RELEASE_CAPSULE | ORAL | 0 refills | Status: DC

## 2019-11-26 NOTE — Telephone Encounter (Signed)
Refill of dexilant sent to pharmacy.

## 2019-11-26 NOTE — Progress Notes (Signed)
Refill of Dexilant sent to pharmacy.  Pt last seen 03-2019

## 2019-11-26 NOTE — Telephone Encounter (Signed)
Patient is requesting refill on Dexilant also stated it would require auth from [facility].

## 2019-11-27 NOTE — Telephone Encounter (Signed)
PA submitted for Dexilant 60 mg once daily via CoverMyMeds.

## 2019-11-30 MED FILL — DEXILANT DR 60 MG CAPSULE: 60 | 90 days supply | Qty: 90 | Fill #0

## 2019-11-30 NOTE — Telephone Encounter (Signed)
PA approved. KeyIT:[id number] - PA Case ID: [id number]. Approved through 11-25-20.

## 2019-12-16 ENCOUNTER — Ambulatory Visit
Admission: RE | Admit: 2019-12-16 | Discharge: 2019-12-16 | Disposition: A | Discharge status: Home / Self Care | Payer: 59 | Source: Ambulatory Visit | Attending: Family Medicine | Admitting: Family Medicine

## 2019-12-16 ENCOUNTER — Other Ambulatory Visit: Payer: Self-pay

## 2019-12-16 ENCOUNTER — Telehealth: Payer: Self-pay | Admitting: Gastroenterology

## 2019-12-16 DIAGNOSIS — Z780 Asymptomatic menopausal state: Secondary | ICD-10-CM | POA: Diagnosis not present

## 2019-12-16 DIAGNOSIS — E2839 Other primary ovarian failure: Secondary | ICD-10-CM

## 2019-12-16 DIAGNOSIS — Z1231 Encounter for screening mammogram for malignant neoplasm of breast: Secondary | ICD-10-CM

## 2019-12-16 DIAGNOSIS — Z13820 Encounter for screening for osteoporosis: Secondary | ICD-10-CM | POA: Diagnosis not present

## 2019-12-16 NOTE — Telephone Encounter (Signed)
Called and LM for pt to call back to discuss what she needs a prescription for. What is the medication for?

## 2019-12-17 ENCOUNTER — Encounter: Payer: Self-pay | Admitting: Family Medicine

## 2019-12-17 ENCOUNTER — Other Ambulatory Visit: Payer: Self-pay

## 2019-12-17 DIAGNOSIS — E559 Vitamin D deficiency, unspecified: Secondary | ICD-10-CM

## 2019-12-17 MED ORDER — VITAMIN D (ERGOCALCIFEROL) 1.25 MG (50000 UNIT) PO CAPS: 50000.0000 [IU] | ORAL_CAPSULE | ORAL | 1 refills | Status: DC

## 2019-12-17 MED ORDER — AMBULATORY NON FORMULARY MEDICATION: 11 refills | Status: DC

## 2019-12-17 MED FILL — VIT D2 1.25 MG (50,000 UNIT: 1.25 MG | 84 days supply | Qty: 12 | Fill #0

## 2019-12-17 NOTE — Telephone Encounter (Signed)
Patient sent mychart message and Barbera Setters, RN has addressed her request

## 2020-01-30 ENCOUNTER — Telehealth (INDEPENDENT_AMBULATORY_CARE_PROVIDER_SITE_OTHER): Payer: 59 | Admitting: Family Medicine

## 2020-01-30 DIAGNOSIS — H1132 Conjunctival hemorrhage, left eye: Secondary | ICD-10-CM

## 2020-01-30 NOTE — Progress Notes (Signed)
Patient ID: Emily Osborne, female   DOB: 11-Nov-1960, 59 y.o.   MRN: [id number]   This visit type was conducted due to national recommendations for restrictions regarding the COVID-19 pandemic in an effort to limit this patient's exposure and mitigate transmission in our community.   Virtual Visit via Video Note  I connected with Ellard Artis on 01/30/20 at 10:15 AM EDT by a video enabled telemedicine application and verified that I am speaking with the correct person using two identifiers.  Location patient: home Location provider:work or home office Persons participating in the virtual visit: patient, provider  I discussed the limitations of evaluation and management by telemedicine and the availability of in person appointments. The patient expressed understanding and agreed to proceed.   HPI: Ms. Beiser was seen by virtual visit Saturday morning clinic with onset yesterday of some redness involving the left eye.  This was along the medial aspect of the sclera.  She does wear contacts but denies any trauma.  No blurred vision.  No eye pain.  No known injury.  She does not take any blood thinners.  She is allergic to aspirin.  She has not noted any general bruising or other bleeding issues.  She states that the redness is already improved somewhat today.  She does not have any significant conjunctival erythema.  Her blood pressure this morning is stable at 126/86.  No history of hypertension.   ROS: See pertinent positives and negatives per HPI.  Past Medical History:  Diagnosis Date  . Allergy   . Anxiety   . Asthma    pt has inhaler  . Back pain   . C. difficile colitis   . Cataract    bil eyes  . Chest pain   . Chronic fatigue syndrome   . Clostridium difficile colitis 12/13/2016  . Diverticulitis   . Dyspnea   . Dysrhythmia    palpitations  . Edema   . Elevated blood pressure reading without diagnosis of hypertension    "just elevated when I'm in pain" (12/07/2015)  . Fatty  liver   . Fibromyalgia   . Ganglion of joint    right wrist  . GERD (gastroesophageal reflux disease)   . H. pylori infection   . Headache    hx of  . Helicobacter pylori (H. pylori) infection 12/13/2016  . Herpes zoster without mention of complication   . HTN (hypertension)   . Hyperlipidemia   . Hyperthyroidism   . IBS (irritable bowel syndrome)   . Leg edema   . Multiple drug allergies 06/18/2017  . Myalgia   . Nontraumatic rupture of Achilles tendon   . Other acute reactions to stress   . Pain in joint, shoulder region   . Pain in joint, upper arm   . Pain in limb   . Palpitations   . Personal history of other diseases of digestive system   . Pneumonia    once  . PONV (postoperative nausea and vomiting)   . Routine screening for STI (sexually transmitted infection) 12/13/2016  . S/P partial colectomy 06/18/2017  . Thyroid disease    hyperthyroidism  . Type 2 diabetes mellitus with hyperglycemia, without long-term current use of insulin ([place]) 08/07/2018  . Vitamin B 12 deficiency     Past Surgical History:  Procedure Laterality Date  . ABDOMINAL HYSTERECTOMY    . ACHILLES TENDON REPAIR Right 2005  . BUNIONECTOMY Bilateral    Bunionectomy 1983  . COLON SURGERY  01/23/2017  6 to 8 inches sigmoid colon removed  . COLONOSCOPY    . GANGLION CYST EXCISION Right    wrist  . LEFT HEART CATH AND CORONARY ANGIOGRAPHY N/A 10/03/2016   Procedure: Left Heart Cath and Coronary Angiography;  Surgeon: Burnell Blanks, MD;  Location: [facility];  Service: Cardiovascular;  Laterality: N/A;  . MENISCUS REPAIR Right 04/2014  . POLYPECTOMY    . TUBAL LIGATION    . wisdomteeth extraction      Family History  Problem Relation Age of Onset  . Hypertension Father   . Diabetes Father   . Heart disease Father   . Kidney disease Father        Died, 15  . Hyperlipidemia Father   . Leukemia Brother   . Cancer Brother        myleoblastic anemia  . Hyperlipidemia  Mother   . Hypertension Mother        Died, 84  . Thyroid disease Mother        Thyroid surgery  . Obesity Mother   . Heart disease Mother   . Pernicious anemia Sister   . Thyroid disease Sister        On thyroid Rx  . Lupus Daughter   . Coronary artery disease Brother   . Hypertension Brother   . Colon cancer Neg Hx   . Esophageal cancer Neg Hx   . Rectal cancer Neg Hx   . Stomach cancer Neg Hx   . Pancreatic cancer Neg Hx   . Prostate cancer Neg Hx   . Colon polyps Neg Hx     SOCIAL HX: Non-smoker   Current Outpatient Medications:  .  ALPRAZolam (XANAX) 0.25 MG tablet, TAKE 1 TABLET BY MOUTH THREE TIMES A DAY AS NEEDED, Disp: 30 tablet, Rfl: 2 .  acetaminophen (TYLENOL) 500 MG tablet, Take 1,000 mg by mouth every 6 (six) hours as needed for moderate pain. , Disp: , Rfl:  .  albuterol (PROAIR HFA) 108 (90 Base) MCG/ACT inhaler, USE 2 PUFFS EVERY 4 HOURS AS NEEDED FOR COUGH, WHEEZE OR SHORTNESS OF BREATH., Disp: 8.5 each, Rfl: 2 .  AMBULATORY NON FORMULARY MEDICATION, Medication Name: Critic-Aid Dentist  Apply to affected area as needed, Disp: 30 g, Rfl: 11 .  azelastine (OPTIVAR) 0.05 % ophthalmic solution, Place 1 drop into both eyes 2 (two) times daily as needed., Disp: , Rfl:  .  Blood Glucose Monitoring Suppl (FREESTYLE FREEDOM) KIT, 1 Device by Does not apply route daily., Disp: 1 each, Rfl: 0 .  budesonide-formoterol (SYMBICORT) 80-4.5 MCG/ACT inhaler, Take 2 puffs first thing in am and then another 2 puffs about 12 hours later., Disp: 1 Inhaler, Rfl: 11 .  dexlansoprazole (DEXILANT) 60 MG capsule, TAKE 1 CAPSULE (60 MG TOTAL) BY MOUTH DAILY., Disp: 90 capsule, Rfl: 0 .  diclofenac sodium (VOLTAREN) 1 % GEL, Apply 2 g topically 4 (four) times daily. Rub into affected area of foot 2 to 4 times daily, Disp: 100 g, Rfl: 2 .  famotidine (PEPCID) 20 MG tablet, One after supper, Disp: 30 tablet, Rfl: 11 .  fexofenadine  (ALLEGRA) 180 MG tablet, Take 1 tablet (180 mg total) by mouth daily., Disp: 90 tablet, Rfl: 1 .  fluticasone (FLONASE) 50 MCG/ACT nasal spray, PLACE 2 SPRAYS INTO BOTH NOSTRILS DAILY., Disp: 16 g, Rfl: 2 .  glucose blood (FREESTYLE LITE) test strip, USE TO CHECK BLOOD SUGAR ONCE A DAY, Disp: 100 each, Rfl:  1 .  Iron Combinations (I.L.X. B-12) ELIX, Take 1 each by mouth daily as needed (energy). 1 droplet full, Disp: , Rfl:  .  lidocaine (LIDODERM) 5 %, Apply to affected area QD PRN for a Week, Disp: 30 patch, Rfl: 0 .  valACYclovir (VALTREX) 1000 MG tablet, Take 1,000 mg by mouth 3 (three) times daily as needed., Disp: , Rfl:  .  Vitamin D, Ergocalciferol, (DRISDOL) 1.25 MG (50000 UNIT) CAPS capsule, Take 1 capsule (50,000 Units total) by mouth every 7 (seven) days., Disp: 4 capsule, Rfl: 0 .  Vitamin D, Ergocalciferol, (DRISDOL) 1.25 MG (50000 UNIT) CAPS capsule, Take 1 capsule (50,000 Units total) by mouth every 7 (seven) days., Disp: 12 capsule, Rfl: 1  EXAM:  VITALS per patient if applicable:  GENERAL: alert, oriented, appears well and in no acute distress  HEENT: Via video assessment she appears to have very small subconjunctival hemorrhage left eye medially.  Lighting is not optimal.  There is no evidence for any conjunctival erythema.  No evidence for hyphema  NECK: normal movements of the head and neck  LUNGS: on inspection no signs of respiratory distress, breathing rate appears normal, no obvious gross SOB, gasping or wheezing  CV: no obvious cyanosis  MS: moves all visible extremities without noticeable abnormality  PSYCH/NEURO: pleasant and cooperative, no obvious depression or anxiety, speech and thought processing grossly intact  ASSESSMENT AND PLAN:  Discussed the following assessment and plan:  Probable small left subconjunctival hemorrhage.  She states this is already improving.  She does not have any red flags such as blurred vision, eye pain, or other  concerns  -Reassurance this should improve over the next several days. -Follow-up promptly for any eye pain, blurred vision, or other concerns. -We explained that subconjunctival hemorrhages are self-limiting     I discussed the assessment and treatment plan with the patient. The patient was provided an opportunity to ask questions and all were answered. The patient agreed with the plan and demonstrated an understanding of the instructions.   The patient was advised to call back or seek an in-person evaluation if the symptoms worsen or if the condition fails to improve as anticipated.     [name] Littler, MD

## 2020-02-01 ENCOUNTER — Telehealth: Payer: Self-pay

## 2020-02-01 NOTE — Telephone Encounter (Signed)
Patient was evaluated/treated at Saturday clinic.    [facility] Night - Client TELEPHONE ADVICE RECORD [facility] Patient Name: Emily Osborne Gender: Female DOB: 01-27-1961 Age: 59 Y 11 D Return Phone Number: [phone] (Primary) Address: City/State/Zip: [place] [address] Client [facility] Night - Client Client Site [facility] - Night Physician [name]- MD Contact Type Call Who Is Calling Patient / Member / Family / Caregiver Call Type Triage / Clinical Relationship To Patient Self Return Phone Number [phone] (Primary) Chief Complaint Headache Reason for Call Symptomatic / Request for Health Information Initial Comment CBWN: Caller states she has some bleeding in her left eye. She doesn't feel bad or anything. Her BP 112/93 and 112/89. She has some heaviness in her head. /// Caller states the white part of her eye is bloody. Her bp is a little high. Does she need to go get this checked. Translation No Nurse Assessment Nurse: Loreen Freud, RN, Crystal Date/Time (Eastern Time): 01/29/2020 6:52:11 PM Confirm and document reason for call. If symptomatic, describe symptoms. ---Caller states she has some bleeding in her left eye. She doesn't feel bad or anything. Her BP 112/93 and 112/89. She has some heaviness in her head. Started a couple of hours ago. No discharge. Has the patient had close contact with a person known or suspected to have the novel coronavirus illness OR traveled / lives in area with major community spread (including international travel) in the last 14 days from the onset of symptoms? * If Asymptomatic, screen for exposure and travel within the last 14 days. ---No Does the patient have any new or worsening symptoms? ---Yes Will a triage be completed? ---Yes Related visit to physician within the last 2 weeks? ---No Does the PT have any chronic conditions? (i.e. diabetes, asthma,  this includes High risk factors for pregnancy, etc.) ---Yes List chronic conditions. ---Asthma, allergies, GERD Is this a behavioral health or substance abuse call? ---No Guidelines Guideline Title Affirmed Question Affirmed Notes Nurse Date/Time (Eastern Time) Eye - Red Without Pus Bleeding on white of the eye Depew, RN, Crystal 01/29/2020 6:54:58 PMPLEASE NOTE: All timestamps contained within this report are represented as [place] Standard Time. CONFIDENTIALTY NOTICE: This fax transmission is intended only for the addressee. It contains information that is legally privileged, confidential or otherwise protected from use or disclosure. If you are not the intended recipient, you are strictly prohibited from reviewing, disclosing, copying using or disseminating any of this information or taking any action in reliance on or regarding this information. If you have received this fax in error, please notify us immediately by telephone so that we can arrange for its return to [place]. Phone: [phone], Toll-Free: [phone], Fax: [phone] Page: 2 of 2 Call Id: [id number] [place]. Time Eilene Ghazi Time) Disposition Final User 01/29/2020 6:25:37 PM Send To Call Back Waiting For Nurse Sarajane Marek 01/29/2020 6:35:35 PM Send To Clinical Follow Up Augustin Schooling, RN, Kaila 01/29/2020 6:38:30 PM Attempt made - no message left Depew, RN, Crystal 01/29/2020 6:59:34 PM SEE PCP WITHIN 3 DAYS Yes Depew, RN, Crystal Caller Disagree/Comply Comply Caller Understands Yes PreDisposition Did not know what to do Care Advice Given Per Guideline SEE PCP WITHIN 3 DAYS: * You need to be seen within 2 or 3 days. * PCP VISIT: Call your doctor (or NP/PA) during regular office hours and make an appointment. A clinic or urgent care center are good places to go for care if your doctor's office is closed  or you can't get an appointment. NOTE: If office will be open tomorrow, tell caller to call then, not in 3 days. REASSURANCE  AND EDUCATION: * The fragile blood vessels in the white area of the eye can bleed as a result of minor and sometimes forgotten trauma, or from forceful coughing or vomiting. Sometimes this can occur in people with pink-eye. REMOVE CONTACTS: * Remove your contact lenses; you need to switch to glasses temporarily. (Reason: to prevent damage to the cornea) CALL BACK IF: * Severe or increasing pain * Pus or discharge occurs * Blurred vision occurs * You become worse. CARE ADVICE given per Eye - Red without Pus (Adult) guideline. Comments User: Willette Brace, RN Date/Time Eilene Ghazi Time): 01/29/2020 6:38:45 PM Unable to leave message, mail box is full. Referrals  [facility]

## 2020-03-01 ENCOUNTER — Ambulatory Visit: Payer: 59 | Admitting: Family Medicine

## 2020-03-03 ENCOUNTER — Encounter: Payer: Self-pay | Admitting: Family Medicine

## 2020-03-03 MED FILL — VIT D2 1.25 MG (50,000 UNIT: 1.25 MG | 84 days supply | Qty: 12 | Fill #1

## 2020-03-03 MED FILL — DEXILANT DR 60 MG CAPSULE: 60 | 90 days supply | Qty: 90 | Fill #1

## 2020-03-09 ENCOUNTER — Other Ambulatory Visit: Payer: Self-pay | Admitting: Family Medicine

## 2020-03-09 ENCOUNTER — Encounter: Payer: Self-pay | Admitting: Family Medicine

## 2020-03-09 DIAGNOSIS — J302 Other seasonal allergic rhinitis: Secondary | ICD-10-CM

## 2020-03-09 MED ORDER — LEVOCETIRIZINE DIHYDROCHLORIDE 5 MG PO TABS: 5.0000 mg | ORAL_TABLET | Freq: Every evening | ORAL | 3 refills | Status: DC

## 2020-03-09 NOTE — Telephone Encounter (Signed)
Please advise 

## 2020-03-10 MED FILL — LEVOCETIRIZINE 5 MG TABLET: 5 | 90 days supply | Qty: 90 | Fill #0

## 2020-03-11 ENCOUNTER — Telehealth: Payer: Self-pay | Admitting: Family Medicine

## 2020-03-11 NOTE — Telephone Encounter (Signed)
Patient called in reference to daughter documentation needing to be sent to her insurance company. Patient transferred to medical records.

## 2020-05-05 ENCOUNTER — Other Ambulatory Visit: Payer: Self-pay | Admitting: Internal Medicine

## 2020-05-05 MED FILL — FLUTICASONE PROP 50 MCG SPR: 50 | 30 days supply | Qty: 16 | Fill #1

## 2020-05-05 MED FILL — SYMBICORT 80-4.5 MCG INH: 80-4.5 | 30 days supply | Qty: 10 | Fill #0

## 2020-05-23 ENCOUNTER — Encounter: Payer: Self-pay | Admitting: Family Medicine

## 2020-05-23 ENCOUNTER — Ambulatory Visit: Payer: 59 | Attending: Internal Medicine

## 2020-05-23 ENCOUNTER — Encounter: Payer: Self-pay | Admitting: *Deleted

## 2020-05-23 ENCOUNTER — Other Ambulatory Visit: Payer: Self-pay

## 2020-05-23 ENCOUNTER — Ambulatory Visit (INDEPENDENT_AMBULATORY_CARE_PROVIDER_SITE_OTHER): Payer: 59 | Admitting: Family Medicine

## 2020-05-23 VITALS — BP 120/88 | HR 72 | Temp 98.5°F | Resp 18 | Ht 65.0 in | Wt 242.0 lb

## 2020-05-23 DIAGNOSIS — R002 Palpitations: Secondary | ICD-10-CM | POA: Diagnosis not present

## 2020-05-23 DIAGNOSIS — R42 Dizziness and giddiness: Secondary | ICD-10-CM

## 2020-05-23 DIAGNOSIS — Z23 Encounter for immunization: Secondary | ICD-10-CM

## 2020-05-23 NOTE — Patient Instructions (Signed)

## 2020-05-23 NOTE — Progress Notes (Signed)
Patient ID: Emily Osborne, female    DOB: [birth year]  Age: 59 y.o. MRN: [id number]    Subjective:  Subjective  HPI Emily Osborne presents for dizziness and palpitations ---no chest pain  Review of Systems  Constitutional: Negative for appetite change, diaphoresis, fatigue and unexpected weight change.  Eyes: Negative for pain, redness and visual disturbance.  Respiratory: Negative for cough, chest tightness, shortness of breath and wheezing.   Cardiovascular: Positive for palpitations. Negative for chest pain and leg swelling.  Endocrine: Negative for cold intolerance, heat intolerance, polydipsia, polyphagia and polyuria.  Genitourinary: Negative for difficulty urinating, dysuria and frequency.  Neurological: Positive for dizziness. Negative for light-headedness, numbness and headaches.    History Past Medical History:  Diagnosis Date  . Allergy   . Anxiety   . Asthma    pt has inhaler  . Back pain   . C. difficile colitis   . Cataract    bil eyes  . Chest pain   . Chronic fatigue syndrome   . Clostridium difficile colitis 12/13/2016  . Diverticulitis   . Dyspnea   . Dysrhythmia    palpitations  . Edema   . Elevated blood pressure reading without diagnosis of hypertension    "just elevated when I'm in pain" (12/07/2015)  . Fatty liver   . Fibromyalgia   . Ganglion of joint    right wrist  . GERD (gastroesophageal reflux disease)   . H. pylori infection   . Headache    hx of  . Helicobacter pylori (H. pylori) infection 12/13/2016  . Herpes zoster without mention of complication   . HTN (hypertension)   . Hyperlipidemia   . Hyperthyroidism   . IBS (irritable bowel syndrome)   . Leg edema   . Multiple drug allergies 06/18/2017  . Myalgia   . Nontraumatic rupture of Achilles tendon   . Other acute reactions to stress   . Pain in joint, shoulder region   . Pain in joint, upper arm   . Pain in limb   . Palpitations   . Personal history of other diseases of  digestive system   . Pneumonia    once  . PONV (postoperative nausea and vomiting)   . Routine screening for STI (sexually transmitted infection) 12/13/2016  . S/P partial colectomy 06/18/2017  . Thyroid disease    hyperthyroidism  . Type 2 diabetes mellitus with hyperglycemia, without long-term current use of insulin ([place]) 08/07/2018  . Vitamin B 12 deficiency     She has a past surgical history that includes Abdominal hysterectomy; Tubal ligation; Bunionectomy (Bilateral); Achilles tendon repair (Right, 2005); Ganglion cyst excision (Right); Meniscus repair (Right, 04/2014); wisdomteeth extraction; LEFT HEART CATH AND CORONARY ANGIOGRAPHY (N/A, 10/03/2016); Colon surgery (01/23/2017); Colonoscopy; and Polypectomy.   Her family history includes Cancer in her brother; Coronary artery disease in her brother; Diabetes in her father; Heart disease in her father and mother; Hyperlipidemia in her father and mother; Hypertension in her brother, father, and mother; Kidney disease in her father; Leukemia in her brother; Lupus in her daughter; Obesity in her mother; Pernicious anemia in her sister; Thyroid disease in her mother and sister.She reports that she has never smoked. She has never used smokeless tobacco. She reports that she does not drink alcohol and does not use drugs.  Current Outpatient Medications on File Prior to Visit  Medication Sig Dispense Refill  . acetaminophen (TYLENOL) 500 MG tablet Take 1,000 mg by mouth every 6 (six) hours as needed  for moderate pain.     Marland Kitchen albuterol (PROAIR HFA) 108 (90 Base) MCG/ACT inhaler USE 2 PUFFS EVERY 4 HOURS AS NEEDED FOR COUGH, WHEEZE OR SHORTNESS OF BREATH. 8.5 each 2  . ALPRAZolam (XANAX) 0.25 MG tablet TAKE 1 TABLET BY MOUTH THREE TIMES A DAY AS NEEDED 30 tablet 2  . AMBULATORY NON FORMULARY MEDICATION Medication Name: [facility]  Apply to affected area as needed 30 g 11  . azelastine  (OPTIVAR) 0.05 % ophthalmic solution Place 1 drop into both eyes 2 (two) times daily as needed.    . Blood Glucose Monitoring Suppl (FREESTYLE FREEDOM) KIT 1 Device by Does not apply route daily. 1 each 0  . budesonide-formoterol (SYMBICORT) 80-4.5 MCG/ACT inhaler INHALE 2 PUFFS BY MOUTH FIRST THING IN THE MORNING AND THEN ANOTHER 2 PUFFS ABOUT 12 HOURS LATER 10.2 g 4  . dexlansoprazole (DEXILANT) 60 MG capsule TAKE 1 CAPSULE (60 MG TOTAL) BY MOUTH DAILY. 90 capsule 0  . diclofenac sodium (VOLTAREN) 1 % GEL Apply 2 g topically 4 (four) times daily. Rub into affected area of foot 2 to 4 times daily 100 g 2  . famotidine (PEPCID) 20 MG tablet One after supper 30 tablet 11  . fluticasone (FLONASE) 50 MCG/ACT nasal spray PLACE 2 SPRAYS INTO BOTH NOSTRILS DAILY. 16 g 2  . glucose blood (FREESTYLE LITE) test strip USE TO CHECK BLOOD SUGAR ONCE A DAY 100 each 1  . Iron Combinations (I.L.X. B-12) ELIX Take 1 each by mouth daily as needed (energy). 1 droplet full    . levocetirizine (XYZAL) 5 MG tablet Take 1 tablet (5 mg total) by mouth every evening. 90 tablet 3  . lidocaine (LIDODERM) 5 % Apply to affected area QD PRN for a Week 30 patch 0  . valACYclovir (VALTREX) 1000 MG tablet Take 1,000 mg by mouth 3 (three) times daily as needed.    . Vitamin D, Ergocalciferol, (DRISDOL) 1.25 MG (50000 UNIT) CAPS capsule Take 1 capsule (50,000 Units total) by mouth every 7 (seven) days. 12 capsule 1   No current facility-administered medications on file prior to visit.     Objective:  Objective  Physical Exam Vitals and nursing note reviewed.  Constitutional:      Appearance: She is well-developed.  HENT:     Head: Normocephalic and atraumatic.  Eyes:     Conjunctiva/sclera: Conjunctivae normal.  Neck:     Thyroid: No thyromegaly.     Vascular: No carotid bruit or JVD.  Cardiovascular:     Rate and Rhythm: Normal rate and regular rhythm.     Heart sounds: Normal heart sounds. No murmur heard.    Pulmonary:     Effort: Pulmonary effort is normal. No respiratory distress.     Breath sounds: Normal breath sounds. No wheezing or rales.  Chest:     Chest wall: No tenderness.  Musculoskeletal:     Cervical back: Normal range of motion and neck supple.  Neurological:     Mental Status: She is alert and oriented to person, place, and time.  Psychiatric:        Mood and Affect: Mood normal.        Behavior: Behavior normal.        Thought Content: Thought content normal.        Judgment: Judgment normal.    BP 120/88 (BP Location: Right Arm, Patient Position: Sitting, Cuff Size: Large)   Pulse 72   Temp 98.5  F (36.9 C) (Oral)   Resp 18   Ht 5' 5"  (1.651 m)   Wt 242 lb (109.8 kg)   SpO2 97%   BMI 40.27 kg/m  Wt Readings from Last 3 Encounters:  05/23/20 242 lb (109.8 kg)  09/25/19 233 lb 3.2 oz (105.8 kg)  08/31/19 232 lb (105.2 kg)     Lab Results  Component Value Date   WBC 5.4 08/31/2019   HGB 13.0 08/31/2019   HCT 39.9 08/31/2019   PLT 238.0 08/31/2019   GLUCOSE 85 08/31/2019   CHOL 198 08/31/2019   TRIG 111.0 08/31/2019   HDL 58.60 08/31/2019   LDLDIRECT 139.4 12/26/2011   LDLCALC 117 (H) 08/31/2019   ALT 31 08/31/2019   AST 26 08/31/2019   NA 142 08/31/2019   K 3.8 08/31/2019   CL 106 08/31/2019   CREATININE 0.88 08/31/2019   BUN 14 08/31/2019   CO2 31 08/31/2019   TSH 0.49 06/11/2019   INR 0.93 10/03/2016   HGBA1C 5.8 08/31/2019   MICROALBUR <0.7 08/31/2019   EKG-- sinus brady,  No acute changes    DG Bone Density  Result Date: 12/16/2019 EXAM: DUAL X-RAY ABSORPTIOMETRY (DXA) FOR BONE MINERAL DENSITY IMPRESSION: Referring Physician:  Rosalita Chessman Your patient completed a BMD test using Lunar IDXA DXA system ( analysis version: 16 ) manufactured by [employer]. Technologist: WLS PATIENT: Name: SEBASTIAN, DZIK Patient ID:  [id number] Birth Date: Dec 07, 1960 Height:     65.0 in. Sex:         Female Measured:   12/16/2019 Weight:     233.8  lbs. Indications: Estrogen Deficient, Hysterectomy, Low Calcium Intake (269.3), Postmenopausal, Right Ovariectomy Fractures: None Treatments: Vitamin D (E933.5) ASSESSMENT: The BMD measured at Femur Neck Right is 1.194 g/cm2 with a T-score of 1.1. This patient is considered NORMAL according to [facility]) criteria. The scan quality is limited by patient body habitus. Site Region Measured Date Measured Age YA T-score BMD Significant CHANGE DualFemur Neck Right 12/16/2019    59.9         1.1     1.194 g/cm2 AP Spine L1-L4 11/16/2019 59.9 2.0 1.424 g/cm2 * AP Spine L1-L4 10/30/2013 53.7 1.2 1.333 g/cm2 * DualFemur Neck Left 12/16/2019 59.9 1.3 1.222 g/cm2 * DualFemur Neck Left  10/30/2013    53.7         2.0     1.318 g/cm2 World Health Organization [facility]) criteria for post-menopausal, Caucasian Women: Normal       T-score at or above -1 SD Osteopenia   T-score between -1 and -2.5 SD Osteoporosis T-score at or below -2.5 SD RECOMMENDATION: 1. All patients should optimize calcium and vitamin D intake. 2. Consider FDA approved medical therapies in postmenopausal women and men aged 30 years and older, based on the following: a. A hip or vertebral (clinical or morphometric) fracture b. T-score < or = -2.5 at the femoral neck or spine after appropriate evaluation to exclude secondary causes c. Low bone mass (T-score between -1.0 and -2.5 at the femoral neck or spine) and a 10 year probability of a hip fracture > or = 3% or a 10 year probability of a major osteoporosis-related fracture > or = 20% based on the [place]-adapted WHO algorithm d. Clinician judgment and/or patient preferences may indicate treatment for people with 10-year fracture probabilities above or below these levels FOLLOW-UP: Patients with diagnosis of osteoporosis or at high risk for fracture should have regular bone mineral density tests.  For patients eligible for Medicare, routine testing is allowed once every 2 years. The testing frequency  can be increased to one year for patients who have rapidly progressing disease, those who are receiving or discontinuing medical therapy to restore bone mass, or have additional risk factors. I have reviewed this report and agree with the above findings. Mark A. Thornton Papas, M.D. [facility] Electronically Signed   By: Lavonia Dana M.D.   On: 12/16/2019 11:10   MM 3D SCREEN BREAST BILATERAL  Result Date: 12/16/2019 CLINICAL DATA:  Screening. EXAM: DIGITAL SCREENING BILATERAL MAMMOGRAM WITH TOMO AND CAD COMPARISON:  Previous exam(s). ACR Breast Density Category b: There are scattered areas of fibroglandular density. FINDINGS: There are no findings suspicious for malignancy. Images were processed with CAD. IMPRESSION: No mammographic evidence of malignancy. A result letter of this screening mammogram will be mailed directly to the patient. RECOMMENDATION: Screening mammogram in one year. (Code:SM-B-01Y) BI-RADS CATEGORY  1: Negative. Electronically Signed   By: Audie Pinto M.D.   On: 12/16/2019 09:18     Assessment & Plan:  Plan  I have discontinued Rudene Christians "[name]"'s fexofenadine. I am also having her maintain her acetaminophen, FreeStyle Freedom, I.L.X. B-12, albuterol, valACYclovir, azelastine, diclofenac sodium, famotidine, FREESTYLE LITE, lidocaine, fluticasone, ALPRAZolam, Dexilant, AMBULATORY NON FORMULARY MEDICATION, Vitamin D (Ergocalciferol), levocetirizine, and budesonide-formoterol.  No orders of the defined types were placed in this encounter.   Problem List Items Addressed This Visit      Unprioritized   Dizziness - Primary    ekg normal  Check labs       Relevant Orders   CBC with Differential/Platelet   Vitamin B12   Comprehensive metabolic panel   VITAMIN D 25 Hydroxy (Vit-D Deficiency, Fractures)   Thyroid Panel With TSH   EKG 12-Lead (Completed)   Palpitations    occassional  Not often  ekg normal Check labs        Other Visit Diagnoses     Palpitation       Relevant Orders   EKG 12-Lead (Completed)      Follow-up: Return in about 3 months (around 08/23/2020), or if symptoms worsen or fail to improve.  [name], DO

## 2020-05-23 NOTE — Assessment & Plan Note (Signed)
occassional  Not often  ekg normal Check labs

## 2020-05-23 NOTE — Progress Notes (Signed)
   Covid-19 Vaccination Clinic  Name:  Emily Osborne    MRN: [id number] DOB: 1960/10/21  05/23/2020  Emily Osborne was observed post Covid-19 immunization for 15 minutes without incident. She was provided with Vaccine Information Sheet and instruction to access the V-Safe system. Vaccinated by Emily Osborne.  Emily Osborne was instructed to call 911 with any severe reactions post vaccine: Marland Kitchen Difficulty breathing  . Swelling of face and throat  . A fast heartbeat  . A bad rash all over body  . Dizziness and weakness

## 2020-05-23 NOTE — Assessment & Plan Note (Signed)
ekg normal Check labs  

## 2020-05-24 LAB — COMPREHENSIVE METABOLIC PANEL
AG Ratio: 1.6 (calc) (ref 1.0–2.5)
ALT: 38 U/L — ABNORMAL HIGH (ref 6–29)
AST: 31 U/L (ref 10–35)
Albumin: 4.2 g/dL (ref 3.6–5.1)
Alkaline phosphatase (APISO): 67 U/L (ref 37–153)
BUN: 12 mg/dL (ref 7–25)
CO2: 29 mmol/L (ref 20–32)
Calcium: 9.7 mg/dL (ref 8.6–10.4)
Chloride: 105 mmol/L (ref 98–110)
Creat: 0.96 mg/dL (ref 0.50–0.99)
Globulin: 2.6 g/dL (calc) (ref 1.9–3.7)
Glucose, Bld: 90 mg/dL (ref 65–99)
Potassium: 4.7 mmol/L (ref 3.5–5.3)
Sodium: 141 mmol/L (ref 135–146)
Total Bilirubin: 0.4 mg/dL (ref 0.2–1.2)
Total Protein: 6.8 g/dL (ref 6.1–8.1)

## 2020-05-24 LAB — CBC WITH DIFFERENTIAL/PLATELET
Absolute Monocytes: 490 cells/uL (ref 200–950)
Basophils Absolute: 29 cells/uL (ref 0–200)
Basophils Relative: 0.6 %
Eosinophils Absolute: 10 cells/uL — ABNORMAL LOW (ref 15–500)
Eosinophils Relative: 0.2 %
HCT: 41.3 % (ref 35.0–45.0)
Hemoglobin: 13.6 g/dL (ref 11.7–15.5)
Lymphs Abs: 2222 cells/uL (ref 850–3900)
MCH: 29.1 pg (ref 27.0–33.0)
MCHC: 32.9 g/dL (ref 32.0–36.0)
MCV: 88.4 fL (ref 80.0–100.0)
MPV: 9.1 fL (ref 7.5–12.5)
Monocytes Relative: 10.2 %
Neutro Abs: 2050 cells/uL (ref 1500–7800)
Neutrophils Relative %: 42.7 %
Platelets: 250 10*3/uL (ref 140–400)
RBC: 4.67 10*6/uL (ref 3.80–5.10)
RDW: 13.7 % (ref 11.0–15.0)
Total Lymphocyte: 46.3 %
WBC: 4.8 10*3/uL (ref 3.8–10.8)

## 2020-05-24 LAB — THYROID PANEL WITH TSH
Free Thyroxine Index: 2.2 (ref 1.4–3.8)
T3 Uptake: 27 % (ref 22–35)
T4, Total: 8.2 ug/dL (ref 5.1–11.9)
TSH: 1.02 mIU/L (ref 0.40–4.50)

## 2020-05-24 LAB — VITAMIN D 25 HYDROXY (VIT D DEFICIENCY, FRACTURES): Vit D, 25-Hydroxy: 57 ng/mL (ref 30–100)

## 2020-05-24 LAB — VITAMIN B12: Vitamin B-12: 780 pg/mL (ref 200–1100)

## 2020-05-30 ENCOUNTER — Other Ambulatory Visit (HOSPITAL_BASED_OUTPATIENT_CLINIC_OR_DEPARTMENT_OTHER): Payer: Self-pay | Admitting: Internal Medicine

## 2020-05-30 MED FILL — PFIZER-BIONTECH COVID-19 VA: 30 | 1 days supply | Qty: 0 | Fill #0

## 2020-06-03 ENCOUNTER — Emergency Department (HOSPITAL_COMMUNITY)
Admission: EM | Admit: 2020-06-03 | Discharge: 2020-06-03 | Disposition: A | Discharge status: Home / Self Care | Payer: 59 | Attending: Emergency Medicine | Admitting: Emergency Medicine

## 2020-06-03 ENCOUNTER — Encounter (HOSPITAL_COMMUNITY): Payer: Self-pay | Admitting: Emergency Medicine

## 2020-06-03 ENCOUNTER — Emergency Department (HOSPITAL_COMMUNITY): Payer: 59

## 2020-06-03 ENCOUNTER — Other Ambulatory Visit: Payer: Self-pay

## 2020-06-03 DIAGNOSIS — R2990 Unspecified symptoms and signs involving the nervous system: Secondary | ICD-10-CM | POA: Diagnosis not present

## 2020-06-03 DIAGNOSIS — J45909 Unspecified asthma, uncomplicated: Secondary | ICD-10-CM | POA: Diagnosis not present

## 2020-06-03 DIAGNOSIS — E119 Type 2 diabetes mellitus without complications: Secondary | ICD-10-CM | POA: Insufficient documentation

## 2020-06-03 DIAGNOSIS — I10 Essential (primary) hypertension: Secondary | ICD-10-CM | POA: Diagnosis not present

## 2020-06-03 DIAGNOSIS — R9082 White matter disease, unspecified: Secondary | ICD-10-CM | POA: Diagnosis not present

## 2020-06-03 DIAGNOSIS — J3489 Other specified disorders of nose and nasal sinuses: Secondary | ICD-10-CM | POA: Diagnosis not present

## 2020-06-03 DIAGNOSIS — Z79899 Other long term (current) drug therapy: Secondary | ICD-10-CM | POA: Insufficient documentation

## 2020-06-03 DIAGNOSIS — H539 Unspecified visual disturbance: Secondary | ICD-10-CM | POA: Diagnosis not present

## 2020-06-03 DIAGNOSIS — I771 Stricture of artery: Secondary | ICD-10-CM | POA: Diagnosis not present

## 2020-06-03 DIAGNOSIS — R29818 Other symptoms and signs involving the nervous system: Secondary | ICD-10-CM | POA: Diagnosis not present

## 2020-06-03 DIAGNOSIS — R42 Dizziness and giddiness: Secondary | ICD-10-CM | POA: Diagnosis not present

## 2020-06-03 DIAGNOSIS — H53131 Sudden visual loss, right eye: Secondary | ICD-10-CM | POA: Diagnosis not present

## 2020-06-03 LAB — COMPREHENSIVE METABOLIC PANEL
ALT: 44 U/L (ref 0–44)
AST: 36 U/L (ref 15–41)
Albumin: 3.8 g/dL (ref 3.5–5.0)
Alkaline Phosphatase: 70 U/L (ref 38–126)
Anion gap: 10 (ref 5–15)
BUN: 11 mg/dL (ref 6–20)
CO2: 26 mmol/L (ref 22–32)
Calcium: 9.7 mg/dL (ref 8.9–10.3)
Chloride: 105 mmol/L (ref 98–111)
Creatinine, Ser: 0.91 mg/dL (ref 0.44–1.00)
GFR, Estimated: >60 mL/min (ref >60)
Glucose, Bld: 115 mg/dL — ABNORMAL HIGH (ref 70–99)
Potassium: 4.1 mmol/L (ref 3.5–5.1)
Sodium: 141 mmol/L (ref 135–145)
Total Bilirubin: 0.6 mg/dL (ref 0.3–1.2)
Total Protein: 7.1 g/dL (ref 6.5–8.1)

## 2020-06-03 LAB — CBC
HCT: 42.2 % (ref 36.0–46.0)
Hemoglobin: 13.6 g/dL (ref 12.0–15.0)
MCH: 28.4 pg (ref 26.0–34.0)
MCHC: 32.2 g/dL (ref 30.0–36.0)
MCV: 88.1 fL (ref 80.0–100.0)
Platelets: 260 10*3/uL (ref 150–400)
RBC: 4.79 MIL/uL (ref 3.87–5.11)
RDW: 14.2 % (ref 11.5–15.5)
WBC: 4.7 10*3/uL (ref 4.0–10.5)
nRBC: 0 % (ref 0.0–0.2)

## 2020-06-03 LAB — I-STAT CHEM 8, ED
BUN: 14 mg/dL (ref 6–20)
Calcium, Ion: 1.22 mmol/L (ref 1.15–1.40)
Chloride: 105 mmol/L (ref 98–111)
Creatinine, Ser: 0.8 mg/dL (ref 0.44–1.00)
Glucose, Bld: 117 mg/dL — ABNORMAL HIGH (ref 70–99)
HCT: 43 % (ref 36.0–46.0)
Hemoglobin: 14.6 g/dL (ref 12.0–15.0)
Potassium: 4 mmol/L (ref 3.5–5.1)
Sodium: 141 mmol/L (ref 135–145)
TCO2: 28 mmol/L (ref 22–32)

## 2020-06-03 LAB — DIFFERENTIAL
Abs Immature Granulocytes: 0.01 10*3/uL (ref 0.00–0.07)
Basophils Absolute: 0 10*3/uL (ref 0.0–0.1)
Basophils Relative: 0 %
Eosinophils Absolute: 0 10*3/uL (ref 0.0–0.5)
Eosinophils Relative: 0 %
Immature Granulocytes: 0 %
Lymphocytes Relative: 50 %
Lymphs Abs: 2.3 10*3/uL (ref 0.7–4.0)
Monocytes Absolute: 0.5 10*3/uL (ref 0.1–1.0)
Monocytes Relative: 10 %
Neutro Abs: 1.9 10*3/uL (ref 1.7–7.7)
Neutrophils Relative %: 40 %

## 2020-06-03 LAB — I-STAT BETA HCG BLOOD, ED (MC, WL, AP ONLY): I-stat hCG, quantitative: <5 m[IU]/mL (ref <5)

## 2020-06-03 LAB — PROTIME-INR
INR: 1 (ref 0.8–1.2)
Prothrombin Time: 12.4 seconds (ref 11.4–15.2)

## 2020-06-03 LAB — APTT: aPTT: 28 seconds (ref 24–36)

## 2020-06-03 LAB — CBG MONITORING, ED: Glucose-Capillary: 80 mg/dL (ref 70–99)

## 2020-06-03 NOTE — ED Provider Notes (Signed)
[facility] Provider Note   CSN: [id number] Arrival date & time: 06/03/20  1227     History Chief Complaint  Patient presents with  . Visual Field Change    Emily Osborne is a 59 y.o. female.  Pt was washing her car and she bent over.  Pt reports she had a bright light in right eye and then was blind in her right eye for about 3 minutes.  Pt reports she also has had dizziness on and off for about 4 weeks.  Pt had an episode of pain in her right a pain in her right arm that went to her jaw 2 weeks ago.  Pt reports she saw her primary MD after that episode.  Pt reports some tingling to the right side of her face that has been happening for about 4 weeks.  Pt reports some numb sensation to her chin and cheek.  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  Pt has never had any sensations like this   The history is provided by the patient. No language interpreter was used.       Past Medical History:  Diagnosis Date  . Allergy   . Anxiety   . Asthma    pt has inhaler  . Back pain   . C. difficile colitis   . Cataract    bil eyes  . Chest pain   . Chronic fatigue syndrome   . Clostridium difficile colitis 12/13/2016  . Diverticulitis   . Dyspnea   . Dysrhythmia    palpitations  . Edema   . Elevated blood pressure reading without diagnosis of hypertension    "just elevated when I'm in pain" (12/07/2015)  . Fatty liver   . Fibromyalgia   . Ganglion of joint    right wrist  . GERD (gastroesophageal reflux disease)   . H. pylori infection   . Headache    hx of  . Helicobacter pylori (H. pylori) infection 12/13/2016  . Herpes zoster without mention of complication   . HTN (hypertension)   . Hyperlipidemia   . Hyperthyroidism   . IBS (irritable bowel syndrome)   . Leg edema   . Multiple drug allergies 06/18/2017  . Myalgia   . Nontraumatic rupture of Achilles tendon   . Other acute reactions to stress   .  Pain in joint, shoulder region   . Pain in joint, upper arm   . Pain in limb   . Palpitations   . Personal history of other diseases of digestive system   . Pneumonia    once  . PONV (postoperative nausea and vomiting)   . Routine screening for STI (sexually transmitted infection) 12/13/2016  . S/P partial colectomy 06/18/2017  . Thyroid disease    hyperthyroidism  . Type 2 diabetes mellitus with hyperglycemia, without long-term current use of insulin ([place]) 08/07/2018  . Vitamin B 12 deficiency     Patient Active Problem List   Diagnosis Date Noted  . Dizziness 05/23/2020  . Essential hypertension 06/11/2019  . New daily persistent headache 06/11/2019  . Memory loss 06/11/2019  . Upper airway cough syndrome vs cough variant asthma 01/01/2019  . Chest pain 12/09/2018  . Prediabetes 10/07/2018  . Preventative health care 08/07/2018  . Anxiety 10/11/2017  . S/P partial colectomy 06/18/2017  . Multiple drug allergies 06/18/2017  . Colitis 02/05/2017  . Diarrhea 02/05/2017  . Diverticular disease 01/23/2017  .  Helicobacter pylori (H. pylori) infection 12/13/2016  . Routine screening for STI (sexually transmitted infection) 12/13/2016  . Clostridium difficile colitis 12/13/2016  . Diverticulitis 10/10/2016  . Lower abdominal pain 09/29/2016  . Palpitations 02/06/2016  . Mild diastolic dysfunction 34/19/3790  . Graves disease 01/02/2016  . Acute diverticulitis 12/07/2015  . Acute bacterial sinusitis 10/04/2015  . History of colonic polyps 11/15/2014  . Left shoulder pain 09/28/2013  . Left-sided weakness 09/16/2013  . Obesity (BMI 30-39.9) with low erv on pfts 09/25/19 09/02/2013  . Myalgia 06/21/2011  . POSTMENOPAUSAL STATUS 09/07/2010  . PAIN IN JOINT, MULTIPLE SITES 12/27/2009  . LOW BACK PAIN, CHRONIC 12/27/2009  . FATIGUE 12/27/2009  . VAGINITIS 11/22/2009  . DYSPEPSIA&OTHER [facility] DISORDERS FUNCTION STOMACH 09/01/2009  . NAUSEA 08/03/2009  . FLATULENCE-GAS-BLOATING  08/03/2009  . ABDOMINAL PAIN RIGHT UPPER QUADRANT 07/06/2009  . SUPRAPUBIC PAIN 07/06/2009  . GANGLION CYST, HX OF 07/06/2009  . BACK PAIN 05/04/2009  . Hyperlipidemia 09/16/2008  . UNSPECIFIED MYALGIA AND MYOSITIS 09/16/2008  . Precordial pain 07/27/2008  . GERD 06/08/2008  . HELICOBACTER PYLORI GASTRITIS, HX OF 04/05/2008  . OTHER ACUTE REACTIONS TO STRESS 03/16/2008  . ABDOMINAL PAIN, EPIGASTRIC 03/16/2008  . SHOULDER PAIN, RIGHT 10/01/2007  . ELBOW PAIN, RIGHT 10/01/2007  . Pain in limb 04/14/2007  . DEPENDENT EDEMA, RIGHT LEG 04/14/2007  . SHINGLES 04/08/2007  . Asthma 03/18/2007  . GANGLION CYST, WRIST, RIGHT 03/18/2007  . BUNIONECTOMY, HX OF 03/18/2007    Past Surgical History:  Procedure Laterality Date  . ABDOMINAL HYSTERECTOMY    . ACHILLES TENDON REPAIR Right 2005  . BUNIONECTOMY Bilateral    Bunionectomy 1983  . COLON SURGERY  01/23/2017   6 to 8 inches sigmoid colon removed  . COLONOSCOPY    . GANGLION CYST EXCISION Right    wrist  . LEFT HEART CATH AND CORONARY ANGIOGRAPHY N/A 10/03/2016   Procedure: Left Heart Cath and Coronary Angiography;  Surgeon: Burnell Blanks, MD;  Location: [facility];  Service: Cardiovascular;  Laterality: N/A;  . MENISCUS REPAIR Right 04/2014  . POLYPECTOMY    . TUBAL LIGATION    . wisdomteeth extraction       OB History    Gravida  2   Para      Term      Preterm      AB      Living  2     SAB      TAB      Ectopic      Multiple      Live Births              Family History  Problem Relation Age of Onset  . Hypertension Father   . Diabetes Father   . Heart disease Father   . Kidney disease Father        Died, 5  . Hyperlipidemia Father   . Leukemia Brother   . Cancer Brother        myleoblastic anemia  . Hyperlipidemia Mother   . Hypertension Mother        Died, 7  . Thyroid disease Mother        Thyroid surgery  . Obesity Mother   . Heart disease Mother   . Pernicious  anemia Sister   . Thyroid disease Sister        On thyroid Rx  . Lupus Daughter   . Coronary artery disease Brother   . Hypertension Brother   .  Colon cancer Neg Hx   . Esophageal cancer Neg Hx   . Rectal cancer Neg Hx   . Stomach cancer Neg Hx   . Pancreatic cancer Neg Hx   . Prostate cancer Neg Hx   . Colon polyps Neg Hx     Social History   Tobacco Use  . Smoking status: Never Smoker  . Smokeless tobacco: Never Used  Vaping Use  . Vaping Use: Never used  Substance Use Topics  . Alcohol use: No    Alcohol/week: 0.0 standard drinks  . Drug use: No    Home Medications Prior to Admission medications   Medication Sig Start Date End Date Taking? Authorizing Provider  acetaminophen (TYLENOL) 500 MG tablet Take 1,000 mg by mouth every 6 (six) hours as needed for moderate pain.     [provider]  albuterol (PROAIR HFA) 108 (90 Base) MCG/ACT inhaler USE 2 PUFFS EVERY 4 HOURS AS NEEDED FOR COUGH, WHEEZE OR SHORTNESS OF BREATH. 07/29/18   Carollee Herter, Alferd Apa, DO  ALPRAZolam (XANAX) 0.25 MG tablet TAKE 1 TABLET BY MOUTH THREE TIMES A DAY AS NEEDED 11/05/19   Carollee Herter, Alferd Apa, DO  AMBULATORY NON FORMULARY MEDICATION Medication Name: C[facility]ist  Apply to affected area as needed 12/17/19   Armbruster, Carlota Raspberry, MD  azelastine (OPTIVAR) 0.05 % ophthalmic solution Place 1 drop into both eyes 2 (two) times daily as needed.    [provider]  Blood Glucose Monitoring Suppl (FREESTYLE FREEDOM) KIT 1 Device by Does not apply route daily. 06/08/14   Carollee Herter, Yvonne R, DO  budesonide-formoterol (SYMBICORT) 80-4.5 MCG/ACT inhaler INHALE 2 PUFFS BY MOUTH FIRST THING IN THE MORNING AND THEN ANOTHER 2 PUFFS ABOUT 12 HOURS LATER 05/05/20   Tanda Rockers, MD  dexlansoprazole (DEXILANT) 60 MG capsule TAKE 1 CAPSULE (60 MG TOTAL) BY MOUTH DAILY. 11/26/19   Armbruster, Carlota Raspberry, MD  diclofenac sodium (VOLTAREN) 1 %  GEL Apply 2 g topically 4 (four) times daily. Rub into affected area of foot 2 to 4 times daily 10/02/18   Trula Slade, DPM  famotidine (PEPCID) 20 MG tablet One after supper 01/01/19   Tanda Rockers, MD  fluticasone (FLONASE) 50 MCG/ACT nasal spray PLACE 2 SPRAYS INTO BOTH NOSTRILS DAILY. 10/21/19   [name] R, DO  glucose blood (FREESTYLE LITE) test strip USE TO CHECK BLOOD SUGAR ONCE A DAY 04/16/19   [name], DO  Iron Combinations (I.L.X. B-12) ELIX Take 1 each by mouth daily as needed (energy). 1 droplet full    [provider]  levocetirizine (XYZAL) 5 MG tablet Take 1 tablet (5 mg total) by mouth every evening. 03/09/20   Carollee Herter, Alferd Apa, DO  lidocaine (LIDODERM) 5 % Apply to affected area QD PRN for a Week 08/12/19   Lowne [name], Alferd Apa, DO  valACYclovir (VALTREX) 1000 MG tablet Take 1,000 mg by mouth 3 (three) times daily as needed.    [provider]  Vitamin D, Ergocalciferol, (DRISDOL) 1.25 MG (50000 UNIT) CAPS capsule Take 1 capsule (50,000 Units total) by mouth every 7 (seven) days. 12/17/19   [name], DO    Allergies    Aspirin, Gentamicin, Ibuprofen, Metronidazole, Nsaids, Doxycycline, Influenza a (h1n1) monoval vac, Loratadine, Periactin [cyproheptadine], Ranitidine hcl, Sulfamethoxazole-trimethoprim, and Tramadol hcl  Review of Systems   Review of Systems  All other systems reviewed and are negative.   Physical  Exam Updated Vital Signs BP 134/86 (BP Location: Right Arm)   Pulse 64   Temp 98.4 F (36.9 C) (Oral)   Resp 19   Ht 5' 5"  (1.651 m)   Wt 109.8 kg   SpO2 100%   BMI 40.27 kg/m   Physical Exam Vitals and nursing note reviewed.  Constitutional:      Appearance: She is well-developed.  HENT:     Head: Normocephalic.     Right Ear: Tympanic membrane normal.     Left Ear: Tympanic membrane normal.     Nose: Nose normal.     Mouth/Throat:     Mouth: Mucous membranes are moist.  Eyes:      Extraocular Movements: Extraocular movements intact.     Pupils: Pupils are equal, round, and reactive to light.  Cardiovascular:     Rate and Rhythm: Normal rate.     Pulses: Normal pulses.  Pulmonary:     Effort: Pulmonary effort is normal.  Abdominal:     General: There is no distension.  Musculoskeletal:        General: Normal range of motion.     Cervical back: Normal range of motion.  Neurological:     General: No focal deficit present.     Mental Status: She is alert and oriented to person, place, and time.  Psychiatric:        Mood and Affect: Mood normal.     ED Results / Procedures / Treatments   Labs (all labs ordered are listed, but only abnormal results are displayed) Labs Reviewed  COMPREHENSIVE METABOLIC PANEL - Abnormal; Notable for the following components:      Result Value   Glucose, Bld 115 (*)    All other components within normal limits  I-STAT CHEM 8, ED - Abnormal; Notable for the following components:   Glucose, Bld 117 (*)    All other components within normal limits  PROTIME-INR  APTT  CBC  DIFFERENTIAL  I-STAT BETA HCG BLOOD, ED ([facility], [facility], [facility] ONLY)  CBG MONITORING, ED    EKG EKG Interpretation  Date/Time:  Friday June 03 2020 12:28:52 EDT Ventricular Rate:  81 PR Interval:  148 QRS Duration: 80 QT Interval:  362 QTC Calculation: 420 R Axis:   34 Text Interpretation: Normal sinus rhythm Normal ECG Confirmed by Fredia Sorrow [id number]) on 06/03/2020 2:23:40 PM   Radiology CT HEAD WO CONTRAST  Result Date: 06/03/2020 CLINICAL DATA:  sudden vision loss in right eye, states that vision loss lasted approx 3 mins then returned (normal currently). Pt also endorses dizziness that has been on and off for the past 3 weeks EXAM: CT HEAD WITHOUT CONTRAST TECHNIQUE: Contiguous axial images were obtained from the base of the skull through the vertex without intravenous contrast. COMPARISON:  11/02/2015 FINDINGS: Brain: No evidence of acute  infarction, hemorrhage, hydrocephalus, extra-axial collection or mass lesion/mass effect. Vascular: No hyperdense vessel or unexpected calcification. Skull: Normal. Negative for fracture or focal lesion. Sinuses/Orbits: Globes and orbits are unremarkable. Mild maxillary sinus mucosal thickening. Other: None. IMPRESSION: 1. No intracranial abnormality. Electronically Signed   By: Lajean Manes M.D.   On: 06/03/2020 14:28    Procedures Procedures (including critical care time)  Medications Ordered in ED Medications - No data to display  ED Course  I have reviewed the triage vital signs and the nursing notes.  Pertinent labs & imaging results that were available during my care of the patient were reviewed by me and considered in my  medical decision making (see chart for details).    MDM Rules/Calculators/A&P                          MDM:  Pt evaluated by neurology.  MRi and EEG obtained.   Dr. Leonel Ramsay reports no acute findings on EEG.  Mri  No acute.  Pt counseled on results.  Pt advised to follow up with neurology and Dr. Etter Sjogren.  Pt's vision has returned to normal.  Pt advised to see her eye doctor for recheck  Final Clinical Impression(s) / ED Diagnoses Final diagnoses:  Visual disturbance  Dizziness    Rx / DC Orders ED Discharge Orders         Ordered    Consult to Neuro Hospitalist       Provider:  (Not yet assigned)   06/03/20 1514           Fransico Meadow, PA-C 06/03/20 2043    Fredia Sorrow, MD 06/08/20 [phone]

## 2020-06-03 NOTE — Progress Notes (Signed)
EEG complete - results pending 

## 2020-06-03 NOTE — Procedures (Signed)
History: 59 yo F with recurrent episodes of unclear etiology  Sedation: None  Technique: This is a 21 channel routine scalp EEG performed at the bedside with bipolar and monopolar montages arranged in accordance to the international 10/20 system of electrode placement. One channel was dedicated to EKG recording.    Background: The background consists of intermixed alpha and beta activities. There is a well defined posterior dominant rhythm of 9.5 - 10 Hz that attenuates with eye opening. There is rare 5 -6 Hz irregular theta seen at F7, T7 > Fp1, F3.   Photic stimulation: Physiologic driving is present  EEG Abnormalities: 1) Rare theta slow activity in the left frontotemporal region.   Clinical Interpretation: This EEG is suggestive of a nonspecific focal dysfunction in the left temporal region. This is similar to what was described with her previous EEG in 2015.   There was no seizure or seizure predisposition recorded on this study. Please note that lack of epileptiform activity on EEG does not preclude the possibility of epilepsy.   Roland Rack, MD [facility] [phone]  If 7pm- 7am, please page neurology on call as listed in [place].

## 2020-06-03 NOTE — ED Triage Notes (Signed)
Pt states while bending over earlier right around 11 am, got a sudden "flash of light" to the R eye and sudden vision loss, states that vision loss lasted approx 3 mins then returned (normal currently). Pt also endorses dizziness that has been on and off for the past 3 weeks which she saw PCP for. Had some R sided mouth numbness 1 week ago the resolved spontaneously that returned today but has now resolved. Pt a/ox4, speech clear, face symmetrical, hand grip equal, no arm drift. Ambulatory to triage with nad.

## 2020-06-03 NOTE — Discharge Instructions (Addendum)
Follow up with Dr. Etter Sjogren for recheck.  Follow up with neurology for evaluation.  See your eye doctor for complete evaluation

## 2020-06-03 NOTE — Consult Note (Addendum)
Neurology Consultation  Reason for Consult: Multiple neurological symptoms over period of 1 month Referring Physician: Dr. Bobby Rumpf  CC: Multiple neurological symptoms  History is obtained from: Patient  HPI: Emily Osborne is a 59 y.o. female with history of diabetes, B12 deficiency, palpitations, myalgia, IBS, hypothyroidism, hyperlipidemia, hypertension, headache, anxiety and chronic fatigue syndrome.  Patient states that over the last month she has been having symptoms that have been intermittent of what she describes as dizziness.  However when delving into the description of the dizziness, she states it feels like "air passing through her head".  This is associated with some nausea.  There is nothing that precipitates this sensation and it can happen with her standing up or sitting down.  She also states that she has been having palpitations for the last year.  She has had a Holter monitor in the past which showed nothing.  The reason she came to the ED today is she had a period in which she was thinking to who was together and she lost vision in her right eye.  The vision was lost secondary to a bright ball that slowly became smaller over time and then she said it was like flashing lights.  She states that this lasted about 3 minutes and it was not associated with headache.  In addition at that time she also noted numbness in the right face in the V2 3 region which is still ongoing.  She noticed a similar symptom about a week ago but this only lasted a few minutes.  She states that there is been no change in her medications.  She does drink coffee in the morning.  And she admits to being under a lot of stress lately.  Patient states that she has been immunized and has not had Covid.   Past Medical History:  Diagnosis Date   Allergy    Anxiety    Asthma    pt has inhaler   Back pain    C. difficile colitis    Cataract    bil eyes   Chest pain    Chronic fatigue syndrome    Clostridium  difficile colitis 12/13/2016   Diverticulitis    Dyspnea    Dysrhythmia    palpitations   Edema    Elevated blood pressure reading without diagnosis of hypertension    "just elevated when I'm in pain" (12/07/2015)   Fatty liver    Fibromyalgia    Ganglion of joint    right wrist   GERD (gastroesophageal reflux disease)    H. pylori infection    Headache    hx of   Helicobacter pylori (H. pylori) infection 12/13/2016   Herpes zoster without mention of complication    HTN (hypertension)    Hyperlipidemia    Hyperthyroidism    IBS (irritable bowel syndrome)    Leg edema    Multiple drug allergies 06/18/2017   Myalgia    Nontraumatic rupture of Achilles tendon    Other acute reactions to stress    Pain in joint, shoulder region    Pain in joint, upper arm    Pain in limb    Palpitations    Personal history of other diseases of digestive system    Pneumonia    once   PONV (postoperative nausea and vomiting)    Routine screening for STI (sexually transmitted infection) 12/13/2016   S/P partial colectomy 06/18/2017   Thyroid disease    hyperthyroidism   Type 2 diabetes  mellitus with hyperglycemia, without long-term current use of insulin ([place]) 08/07/2018   Vitamin B 12 deficiency     Family History  Problem Relation Age of Onset   Hypertension Father    Diabetes Father    Heart disease Father    Kidney disease Father        Died, 64   Hyperlipidemia Father    Leukemia Brother    Cancer Brother        myleoblastic anemia   Hyperlipidemia Mother    Hypertension Mother        Died, 56   Thyroid disease Mother        Thyroid surgery   Obesity Mother    Heart disease Mother    Pernicious anemia Sister    Thyroid disease Sister        On thyroid Rx   Lupus Daughter    Coronary artery disease Brother    Hypertension Brother    Colon cancer Neg Hx    Esophageal cancer Neg Hx    Rectal cancer Neg Hx    Stomach cancer Neg Hx    Pancreatic cancer Neg Hx    Prostate  cancer Neg Hx    Colon polyps Neg Hx    Social History:   reports that she has never smoked. She has never used smokeless tobacco. She reports that she does not drink alcohol and does not use drugs.  Medications No current facility-administered medications for this encounter.  Current Outpatient Medications:    acetaminophen (TYLENOL) 500 MG tablet, Take 1,000 mg by mouth every 6 (six) hours as needed for moderate pain. , Disp: , Rfl:    albuterol (PROAIR HFA) 108 (90 Base) MCG/ACT inhaler, USE 2 PUFFS EVERY 4 HOURS AS NEEDED FOR COUGH, WHEEZE OR SHORTNESS OF BREATH., Disp: 8.5 each, Rfl: 2   ALPRAZolam (XANAX) 0.25 MG tablet, TAKE 1 TABLET BY MOUTH THREE TIMES A DAY AS NEEDED, Disp: 30 tablet, Rfl: 2   AMBULATORY NON FORMULARY MEDICATION, Medication Name: [facility]  Apply to affected area as needed, Disp: 30 g, Rfl: 11   azelastine (OPTIVAR) 0.05 % ophthalmic solution, Place 1 drop into both eyes 2 (two) times daily as needed., Disp: , Rfl:    Blood Glucose Monitoring Suppl (FREESTYLE FREEDOM) KIT, 1 Device by Does not apply route daily., Disp: 1 each, Rfl: 0   budesonide-formoterol (SYMBICORT) 80-4.5 MCG/ACT inhaler, INHALE 2 PUFFS BY MOUTH FIRST THING IN THE MORNING AND THEN ANOTHER 2 PUFFS ABOUT 12 HOURS LATER, Disp: 10.2 g, Rfl: 4   dexlansoprazole (DEXILANT) 60 MG capsule, TAKE 1 CAPSULE (60 MG TOTAL) BY MOUTH DAILY., Disp: 90 capsule, Rfl: 0   diclofenac sodium (VOLTAREN) 1 % GEL, Apply 2 g topically 4 (four) times daily. Rub into affected area of foot 2 to 4 times daily, Disp: 100 g, Rfl: 2   famotidine (PEPCID) 20 MG tablet, One after supper, Disp: 30 tablet, Rfl: 11   fluticasone (FLONASE) 50 MCG/ACT nasal spray, PLACE 2 SPRAYS INTO BOTH NOSTRILS DAILY., Disp: 16 g, Rfl: 2   glucose blood (FREESTYLE LITE) test strip, USE TO CHECK BLOOD SUGAR ONCE A DAY, Disp: 100 each, Rfl: 1   Iron Combinations (I.L.X. B-12) ELIX, Take 1  each by mouth daily as needed (energy). 1 droplet full, Disp: , Rfl:    levocetirizine (XYZAL) 5 MG tablet, Take 1 tablet (5 mg total) by mouth every evening., Disp: 90 tablet, Rfl: 3   lidocaine (  LIDODERM) 5 %, Apply to affected area QD PRN for a Week, Disp: 30 patch, Rfl: 0   valACYclovir (VALTREX) 1000 MG tablet, Take 1,000 mg by mouth 3 (three) times daily as needed., Disp: , Rfl:    Vitamin D, Ergocalciferol, (DRISDOL) 1.25 MG (50000 UNIT) CAPS capsule, Take 1 capsule (50,000 Units total) by mouth every 7 (seven) days., Disp: 12 capsule, Rfl: 1  ROS:     General ROS: negative for - chills, fatigue, fever, night sweats, weight gain or weight loss Psychological ROS: Positive for - memory difficulties, anxiety Ophthalmic ROS: Positive for -loss of vision in right eye Respiratory ROS: negative for - cough, hemoptysis, shortness of breath or wheezing Cardiovascular ROS: negative for - chest pain, dyspnea on exertion, edema or irregular heartbeat Gastrointestinal ROS: Positive for -nausea Genito-Urinary ROS: negative for - dysuria, hematuria, incontinence or urinary frequency/urgency Musculoskeletal ROS: negative for - joint swelling or muscular weakness Neurological ROS: as noted in HPI Dermatological ROS: negative for rash and skin lesion changes  Exam: Current vital signs: BP 140/89   Pulse 66   Temp 98.4 F (36.9 C) (Oral)   Resp 16   Ht 5' 5"  (1.651 m)   Wt 109.8 kg   SpO2 98%   BMI 40.27 kg/m  Vital signs in last 24 hours: Temp:  [98.2 F (36.8 C)-98.5 F (36.9 C)] 98.4 F (36.9 C) (10/15 1346) Pulse Rate:  [64-83] 66 (10/15 1630) Resp:  [16-20] 16 (10/15 1630) BP: (125-140)/(83-99) 140/89 (10/15 1630) SpO2:  [97 %-100 %] 98 % (10/15 1630) Weight:  [109.8 kg] 109.8 kg (10/15 1234)   Constitutional: Appears well-developed and well-nourished.  Psych: Affect appropriate to situation Eyes: No scleral injection HENT: No OP obstrucion Head: Normocephalic.   Cardiovascular: Normal rate and regular rhythm.  Respiratory: Effort normal, non-labored breathing GI: Soft.  No distension. There is no tenderness.  Skin: WDI  Neuro: Mental Status: Patient is awake, alert, oriented to person, place, month, year, and situation.  Speech shows no dysarthria or aphasia.  Naming, repeating, comprehension is intact.  Patient is able to follow commands without difficulty. Cranial Nerves: II: Visual Fields are full.  III,IV, VI: EOMI without ptosis or diploplia. Pupils equal, round and reactive to light V: Facial sensation decreased on the right V2-V3 region VII: Facial movement is symmetric.  VIII: hearing is intact to voice X: Palat elevates symmetrically XI: Shoulder shrug is symmetric. XII: tongue is midline without atrophy or fasciculations.  Motor: Tone is normal. Bulk is normal. 5/5 strength was present in all four extremities.  Sensory: Sensation is symmetric to light touch and temperature in the arms and legs. DSS Deep Tendon Reflexes: 3+ bicep, brachioradialis, tricep and knee jerk.  2+ ankle jerk with no clonus.  Positive palmomental and positive Hoffmann's knee and positive glabellar Plantars: Downgoing toes bilaterally Cerebellar: FNF and HKS are intact bilaterally  Labs I have reviewed labs in epic and the results pertinent to this consultation are:  CBC    Component Value Date/Time   WBC 4.7 06/03/2020 1258   RBC 4.79 06/03/2020 1258   HGB 14.6 06/03/2020 1311   HGB 13.5 12/12/2017 1102   HCT 43.0 06/03/2020 1311   HCT 41.8 12/12/2017 1102   PLT 260 06/03/2020 1258   MCV 88.1 06/03/2020 1258   MCV 89 12/12/2017 1102   MCH 28.4 06/03/2020 1258   MCHC 32.2 06/03/2020 1258   RDW 14.2 06/03/2020 1258   RDW 15.1 12/12/2017 1102   LYMPHSABS 2.3 06/03/2020 1258  LYMPHSABS 2.3 12/12/2017 1102   MONOABS 0.5 06/03/2020 1258   EOSABS 0.0 06/03/2020 1258   EOSABS 0.1 12/12/2017 1102   BASOSABS 0.0 06/03/2020 1258   BASOSABS 0.0  12/12/2017 1102    CMP     Component Value Date/Time   NA 141 06/03/2020 1311   NA 142 03/20/2018 1046   K 4.0 06/03/2020 1311   CL 105 06/03/2020 1311   CO2 26 06/03/2020 1258   GLUCOSE 117 (H) 06/03/2020 1311   BUN 14 06/03/2020 1311   BUN 17 03/20/2018 1046   CREATININE 0.80 06/03/2020 1311   CREATININE 0.96 05/23/2020 1111   CALCIUM 9.7 06/03/2020 1258   PROT 7.1 06/03/2020 1258   PROT 6.8 03/20/2018 1046   ALBUMIN 3.8 06/03/2020 1258   ALBUMIN 4.4 03/20/2018 1046   AST 36 06/03/2020 1258   ALT 44 06/03/2020 1258   ALKPHOS 70 06/03/2020 1258   BILITOT 0.6 06/03/2020 1258   BILITOT 0.4 03/20/2018 1046   GFRNONAA >60 06/03/2020 1258   GFRAA >60 11/27/2018 1144    Lipid Panel     Component Value Date/Time   CHOL 198 08/31/2019 1204   CHOL 197 03/20/2018 1046   TRIG 111.0 08/31/2019 1204   HDL 58.60 08/31/2019 1204   HDL 57 03/20/2018 1046   CHOLHDL 3 08/31/2019 1204   VLDL 22.2 08/31/2019 1204   LDLCALC 117 (H) 08/31/2019 1204   LDLCALC 124 (H) 03/20/2018 1046   LDLDIRECT 139.4 12/26/2011 0905     Imaging I have reviewed the images obtained:  CT-scan of the brain-no intracranial abnormality   Etta Quill PA-C [facility] [phone]  M-F  (9:00 am- 5:00 PM)  06/03/2020, 4:49 PM     Assessment:  This is a 59 year old female with several brief episodes of "air passing through her head with associated sudden onset nausea that lasts less than a minute". She has had several of the exact same episodes over the last month. Also reports a new once time episode with right face numbness, blinding light in her right eye with R facial tingling that lasted about 3 minute. On chart review, she did have a routine EEG in 2015 with rare focal L temporal slowing. Workup here with CT head is negative. She has some subjective R facial numbness but no sensory deficit to light touch or pinprick. Patient has had a EEG back on 12/22/2013 which did show focal slowing  over the left temporal region.  There were no epileptiform discharges however. MRI Brain at that time was negative for any structural abnormality and she was not started on any AEDs.  Given the stereotypic nature of her current episodes, will get a routine EEG and an MRI Brain without contrast, epilepsy protocol.  Not sure what to make of her one time episode of R eye blinding light with R facial tingling that lasted about 3 mins.  Denies any hx of Cns infection, CNS surgery, no family hx of seizures, no hx of childhood seizures.  Impression: -Possible seizure -Possible stress related  Recommendations: -routine EEG -MRI brain without contrast

## 2020-06-07 ENCOUNTER — Other Ambulatory Visit: Payer: Self-pay | Admitting: Family Medicine

## 2020-06-07 DIAGNOSIS — E559 Vitamin D deficiency, unspecified: Secondary | ICD-10-CM

## 2020-06-07 MED FILL — VIT D2 1.25 MG (50,000 UNIT: 1.25 MG | 84 days supply | Qty: 12 | Fill #0

## 2020-06-09 DIAGNOSIS — H547 Unspecified visual loss: Secondary | ICD-10-CM | POA: Diagnosis not present

## 2020-06-21 ENCOUNTER — Encounter: Payer: Self-pay | Admitting: Gastroenterology

## 2020-06-21 ENCOUNTER — Ambulatory Visit: Payer: 59 | Admitting: Gastroenterology

## 2020-06-21 VITALS — BP 138/82 | HR 86 | Ht 65.0 in | Wt 246.6 lb

## 2020-06-21 DIAGNOSIS — R140 Abdominal distension (gaseous): Secondary | ICD-10-CM | POA: Diagnosis not present

## 2020-06-21 DIAGNOSIS — K219 Gastro-esophageal reflux disease without esophagitis: Secondary | ICD-10-CM | POA: Diagnosis not present

## 2020-06-21 DIAGNOSIS — R194 Change in bowel habit: Secondary | ICD-10-CM | POA: Diagnosis not present

## 2020-06-21 MED ORDER — DICYCLOMINE HCL 10 MG PO CAPS: 10.0000 mg | ORAL_CAPSULE | Freq: Three times a day (TID) | ORAL | 2 refills | Status: DC | PRN

## 2020-06-21 MED ORDER — RIFAXIMIN 550 MG PO TABS: 550.0000 mg | ORAL_TABLET | Freq: Three times a day (TID) | ORAL | 0 refills | Status: DC

## 2020-06-21 MED FILL — DICYCLOMINE 10 MG CAPSULE: 10 | 30 days supply | Qty: 90 | Fill #0

## 2020-06-21 NOTE — Patient Instructions (Signed)
We have given you samples of the following medication to take: Xifaxan 550 mg three times daily x 2 weeks.  We have sent the following medications to your pharmacy for you to pick up at your convenience: Bentyl 10 mg every 8 hours as needed for abdominal cramping/discomfort  Please follow up with Dr Havery Moros as needed.  Follow a low FODMAP diet. We have given you a handout to look over and follow today.  If you are age 60 or younger, your body mass index should be between 19-25. Your Body mass index is 41.04 kg/m. If this is out of the aformentioned range listed, please consider follow up with your Primary Care Provider.   Due to recent changes in healthcare laws, you may see the results of your imaging and laboratory studies on MyChart before your provider has had a chance to review them.  We understand that in some cases there may be results that are confusing or concerning to you. Not all laboratory results come back in the same time frame and the provider may be waiting for multiple results in order to interpret others.  Please give [place] 48 hours in order for your provider to thoroughly review all the results before contacting the office for clarification of your results.

## 2020-06-21 NOTE — Progress Notes (Signed)
HPI :  59 year old female here for a follow-up visit for altered bowel habits and abdominal pain, as well as GERD.  She prior notes for details of her case.  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 difficile, history of GERD.  She states since March she has had an intolerance to seafood that makes her have diarrhea and she feels sick when she eats it.  She has tried to reduce the amount of seafood she is eating and that helps.  She generally has had increased stool frequency with roughly 4 bowel movements a day, ranging from soft to loose.  Denies any blood in her stools.  Stool frequency has been altered for the past 3 to 4 months.  She feels a lot of gas and bloating with this as well as extreme bad odor per her report.  She has not really been taking much of anything for her bowels.  She has had some intermittent left lower quadrant discomfort that associate with a bowel movement and can be relieved with a bowel movement as well.  She states this occurs intermittently, is not constant.  She had a colonoscopy in 2020 which showed a few diminutive polyps, patent surgical anastomosis, diverticulosis.   No nausea or vomiting.  Her appetite's been a little bit less than previous.  Her reflux she states is pretty well controlled on the Dexilant.  That has worked very well for her and better than the regimen she has been on in the past.  She is quite happy with it and wants to continue it.  Endoscopic history: EGD 04/2008 - normal esophagus, H pylori gastriits, normal duodenum Colonoscopy in 2012 with a 39m descending colon polyp removed but not retrieved. Colonoscopy 09/30/2015 - seven small colon polyps - adenomas, sessile serrated, recall in 3 years, pancolonic diverticulosis, and hemorrhoids EGD 05/17/16 - normal esophagus, benign gastric polyps, gastritis, normal duodenum. Biopsies show no  celiac, gastric polyps benign, H pylori gastritis noted EGD 06/04/2017 - gastritis, biposies taken, prominent ampulla - 2cm HH, otherwise normal esophagus - H pylori attempted to be cultured and could not. She followed up with ID  Colonoscopy 10/16/18 - One 3 mm polyp in the sigmoid colon, removed with a cold snare. Resected and retrieved. - One diminutive polyp at the recto-sigmoid colon, removed with a cold snare. Resected and retrieved. - One 3 mm polyp in the rectum, removed with a cold snare. Resected and retrieved. - Diverticulosis in the entire examined colon. - End-to-end colo-colonic anastomosis, characterized by healthy appearing mucosa. - The examination was otherwise normal.  EGD 10/16/18 - 1 cm hiatal hernia. - Normal esophagus - Erythematous mucosa in the gastric fundus and gastric body. - Normal stomach otherwise - biopsies taken to rule out H pylori and negative - Normal duodenal bulb and second portion of the duodenum.  [place] - normal   Echo 12/30/18 - EF 55-60%   Past Medical History:  Diagnosis Date  . Allergy   . Anxiety   . Asthma    pt has inhaler  . Back pain   . C. difficile colitis   . Cataract    bil eyes  . Chest pain   . Chronic fatigue syndrome   . Clostridium difficile colitis 12/13/2016  . Diverticulitis   . Dyspnea   . Dysrhythmia    palpitations  . Edema   . Elevated blood pressure reading without diagnosis  of hypertension    "just elevated when I'm in pain" (12/07/2015)  . Fatty liver   . Fibromyalgia   . Ganglion of joint    right wrist  . GERD (gastroesophageal reflux disease)   . H. pylori infection   . Headache    hx of  . Helicobacter pylori (H. pylori) infection 12/13/2016  . Herpes zoster without mention of complication   . HTN (hypertension)   . Hyperlipidemia   . Hyperthyroidism   . IBS (irritable bowel syndrome)   . Leg edema   . Multiple drug allergies 06/18/2017  . Myalgia   . Nontraumatic rupture of Achilles  tendon   . Other acute reactions to stress   . Pain in joint, shoulder region   . Pain in joint, upper arm   . Pain in limb   . Palpitations   . Personal history of other diseases of digestive system   . Pneumonia    once  . PONV (postoperative nausea and vomiting)   . Routine screening for STI (sexually transmitted infection) 12/13/2016  . S/P partial colectomy 06/18/2017  . Thyroid disease    hyperthyroidism  . Type 2 diabetes mellitus with hyperglycemia, without long-term current use of insulin ([place]) 08/07/2018  . Vitamin B 12 deficiency      Past Surgical History:  Procedure Laterality Date  . ABDOMINAL HYSTERECTOMY    . ACHILLES TENDON REPAIR Right 2005  . BUNIONECTOMY Bilateral    Bunionectomy 1983  . COLON SURGERY  01/23/2017   6 to 8 inches sigmoid colon removed  . COLONOSCOPY    . GANGLION CYST EXCISION Right    wrist  . LEFT HEART CATH AND CORONARY ANGIOGRAPHY N/A 10/03/2016   Procedure: Left Heart Cath and Coronary Angiography;  Surgeon: Burnell Blanks, MD;  Location: [facility];  Service: Cardiovascular;  Laterality: N/A;  . MENISCUS REPAIR Right 04/2014  . POLYPECTOMY    . TUBAL LIGATION    . wisdomteeth extraction     Family History  Problem Relation Age of Onset  . Hypertension Father   . Diabetes Father   . Heart disease Father   . Kidney disease Father        Died, 85  . Hyperlipidemia Father   . Leukemia Brother   . Cancer Brother        myleoblastic anemia  . Hyperlipidemia Mother   . Hypertension Mother        Died, 70  . Thyroid disease Mother        Thyroid surgery  . Obesity Mother   . Heart disease Mother   . Pernicious anemia Sister   . Thyroid disease Sister        On thyroid Rx  . Lupus Daughter   . Coronary artery disease Brother   . Hypertension Brother   . Colon cancer Neg Hx   . Esophageal cancer Neg Hx   . Rectal cancer Neg Hx   . Stomach cancer Neg Hx   . Pancreatic cancer Neg Hx   . Prostate cancer Neg Hx    . Colon polyps Neg Hx    Social History   Tobacco Use  . Smoking status: Never Smoker  . Smokeless tobacco: Never Used  Vaping Use  . Vaping Use: Never used  Substance Use Topics  . Alcohol use: No    Alcohol/week: 0.0 standard drinks  . Drug use: No   Current Outpatient Medications  Medication Sig Dispense Refill  . acetaminophen (  TYLENOL) 500 MG tablet Take 1,000 mg by mouth every 6 (six) hours as needed for moderate pain.     Marland Kitchen albuterol (PROAIR HFA) 108 (90 Base) MCG/ACT inhaler USE 2 PUFFS EVERY 4 HOURS AS NEEDED FOR COUGH, WHEEZE OR SHORTNESS OF BREATH. 8.5 each 2  . ALPRAZolam (XANAX) 0.25 MG tablet TAKE 1 TABLET BY MOUTH THREE TIMES A DAY AS NEEDED 30 tablet 2  . AMBULATORY NON FORMULARY MEDICATION Medication Name: [facility]-[employer]ist  Apply to affected area as needed 30 g 11  . azelastine (OPTIVAR) 0.05 % ophthalmic solution Place 1 drop into both eyes 2 (two) times daily as needed.    . Blood Glucose Monitoring Suppl (FREESTYLE FREEDOM) KIT 1 Device by Does not apply route daily. 1 each 0  . budesonide-formoterol (SYMBICORT) 80-4.5 MCG/ACT inhaler INHALE 2 PUFFS BY MOUTH FIRST THING IN THE MORNING AND THEN ANOTHER 2 PUFFS ABOUT 12 HOURS LATER 10.2 g 4  . dexlansoprazole (DEXILANT) 60 MG capsule TAKE 1 CAPSULE (60 MG TOTAL) BY MOUTH DAILY. 90 capsule 0  . diclofenac sodium (VOLTAREN) 1 % GEL Apply 2 g topically 4 (four) times daily. Rub into affected area of foot 2 to 4 times daily 100 g 2  . famotidine (PEPCID) 20 MG tablet One after supper 30 tablet 11  . fluticasone (FLONASE) 50 MCG/ACT nasal spray PLACE 2 SPRAYS INTO BOTH NOSTRILS DAILY. 16 g 2  . glucose blood (FREESTYLE LITE) test strip USE TO CHECK BLOOD SUGAR ONCE A DAY 100 each 1  . Iron Combinations (I.L.X. B-12) ELIX Take 1 each by mouth daily as needed (energy). 1 droplet full    . levocetirizine (XYZAL) 5 MG tablet Take 1 tablet (5 mg total) by mouth every  evening. 90 tablet 3  . lidocaine (LIDODERM) 5 % Apply to affected area QD PRN for a Week 30 patch 0  . valACYclovir (VALTREX) 1000 MG tablet Take 1,000 mg by mouth 3 (three) times daily as needed.    . Vitamin D, Ergocalciferol, (DRISDOL) 1.25 MG (50000 UNIT) CAPS capsule TAKE 1 CAPSULE (50,000 UNITS TOTAL) BY MOUTH EVERY 7 (SEVEN) DAYS. 12 capsule 1   No current facility-administered medications for this visit.   Allergies  Allergen Reactions  . Aspirin Other (See Comments)    REACTION: anaphylaxis  . Gentamicin Other (See Comments)    Eye drops turned the sclera bright red  . Ibuprofen Other (See Comments)    REACTION: anaphylaxsis  . Metronidazole Swelling    REACTION: red face/swelling  . Nsaids Other (See Comments)    REACTION: anaphylaxis  . Doxycycline Other (See Comments)    REACTION: severe nausea/vomiting  . Influenza A (H1n1) [place] Vac Other (See Comments)    REACTION: sick for 3 weeks  . Loratadine Other (See Comments)    Fatigue/weakness  . Periactin [Cyproheptadine] Other (See Comments)    paranoid   . Ranitidine Hcl Other (See Comments)    REACTION: Lips turned red /peel  . Sulfamethoxazole-Trimethoprim Other (See Comments)    REACTION: face red/peel  . Tramadol Hcl Other (See Comments)    REACTION: paranoid     Review of Systems: All systems reviewed and negative except where noted in HPI.    EEG  Result Date: 06/03/2020 Greta Doom, MD     06/03/2020  7:21 PM History: 59 yo F with recurrent episodes of unclear etiology Sedation: None Technique: This is a 21 channel routine scalp EEG performed at the bedside  with bipolar and monopolar montages arranged in accordance to the international 10/20 system of electrode placement. One channel was dedicated to EKG recording. Background: The background consists of intermixed alpha and beta activities. There is a well defined posterior dominant rhythm of 9.5 - 10 Hz that attenuates with eye opening.  There is rare 5 -6 Hz irregular theta seen at F7, T7 > Fp1, F3. Photic stimulation: Physiologic driving is present EEG Abnormalities: 1) Rare theta slow activity in the left frontotemporal region. Clinical Interpretation: This EEG is suggestive of a nonspecific focal dysfunction in the left temporal region. This is similar to what was described with her previous EEG in 2015. There was no seizure or seizure predisposition recorded on this study. Please note that lack of epileptiform activity on EEG does not preclude the possibility of epilepsy. Roland Rack, MD [facility] [phone] If 7pm- 7am, please page neurology on call as listed in [place].   CT HEAD WO CONTRAST  Result Date: 06/03/2020 CLINICAL DATA:  sudden vision loss in right eye, states that vision loss lasted approx 3 mins then returned (normal currently). Pt also endorses dizziness that has been on and off for the past 3 weeks EXAM: CT HEAD WITHOUT CONTRAST TECHNIQUE: Contiguous axial images were obtained from the base of the skull through the vertex without intravenous contrast. COMPARISON:  11/02/2015 FINDINGS: Brain: No evidence of acute infarction, hemorrhage, hydrocephalus, extra-axial collection or mass lesion/mass effect. Vascular: No hyperdense vessel or unexpected calcification. Skull: Normal. Negative for fracture or focal lesion. Sinuses/Orbits: Globes and orbits are unremarkable. Mild maxillary sinus mucosal thickening. Other: None. IMPRESSION: 1. No intracranial abnormality. Electronically Signed   By: Lajean Manes M.D.   On: 06/03/2020 14:28   MR BRAIN WO CONTRAST  Result Date: 06/03/2020 CLINICAL DATA:  59 year old female with sudden right eye vision loss, intermittent symptoms. Possible seizure. EXAM: MRI HEAD WITHOUT CONTRAST TECHNIQUE: Multiplanar, multiecho pulse sequences of the brain and surrounding structures were obtained without intravenous contrast. COMPARISON:  Head CT earlier today.  Brain MRI  11/02/2015. FINDINGS: Brain: Stable cerebral volume, within normal limits for age. No restricted diffusion to suggest acute infarction. No midline shift, mass effect, evidence of mass lesion, ventriculomegaly, extra-axial collection or acute intracranial hemorrhage. Cervicomedullary junction and pituitary are within normal limits. Stable and largely normal for age gray and white matter signal throughout the brain. There is mild nonspecific patchy T2 and FLAIR hyperintensity in the right periatrial white matter, occasionally elsewhere. No cortical encephalomalacia. No chronic cerebral blood products identified. Deep gray nuclei, brainstem and cerebellum are within normal limits for age. Thin slice coronal temporal lobe images. The hippocampal formations appear relatively symmetric and within normal limits. Other mesial temporal lobe structures are within normal limits. Vascular: Major intracranial vascular flow voids are stable since 2017. generalized intracranial artery tortuosity. Skull and upper cervical spine: Negative visible cervical spine. Bone marrow signal is stable and within normal limits. Sinuses/Orbits: Normal suprasellar cistern and optic chiasm. Optic nerves and other intraorbital soft tissues appear within normal limits. Trace maxillary sinus mucosal thickening is stable. Other: Mastoid air cells remain clear. Grossly normal visible internal auditory structures. Scalp and face appear negative. IMPRESSION: 1. No acute intracranial abnormality. 2. Stable since 2017 and largely unremarkable for age noncontrast MRI appearance of the brain. Mild nonspecific white matter changes. Mild intracranial artery tortuosity. Electronically Signed   By: Genevie Ann M.D.   On: 06/03/2020 20:19    Physical Exam: BP 138/82 (BP Location: Left Arm, Patient Position: Sitting)  Pulse 86   Ht 5' 5"  (1.651 m)   Wt 246 lb 9.6 oz (111.9 kg)   SpO2 98%   BMI 41.04 kg/m  Constitutional: Pleasant,well-developed, female in  no acute distress. HEENT: Normocephalic and atraumatic. Conjunctivae are normal.  Neck supple.  Cardiovascular: Normal rate, regular rhythm.  Pulmonary/chest: Effort normal and breath sounds normal.  Abdominal: Soft, nondistended, nontender.   Extremities: no edema Lymphadenopathy: No cervical adenopathy noted. Neurological: Alert and oriented to person place and time. Skin: Skin is warm and dry. No rashes noted. Psychiatric: Normal mood and affect. Behavior is normal.   ASSESSMENT AND PLAN: 59 year old female here for reassessment the following:  Change in bowel habits / bloating - as above, few months worth of altered bowel habits with significant gas and bloating.  Also intolerance to seafood which make things worse.  I advised her to avoid that if it makes her symptoms reliably worse.  I otherwise discussed options with her.  Her colonoscopy is relatively up-to-date.  Sounds like she may have a functional change in her bowels.  Blood work 2 weeks ago shows no anemia, white blood cell count normal.  I discussed options with her.  I offered her an empiric trial of rifaximin 522m 3 times daily for 2 weeks.  I had a free sample available for her today and will see if that helps her, particularly in light of her gas and bloating.  I also counseled her on a low FODMAP diet will see if that helps, handouts provided.  Finally I offered her a trial of Bentyl to use as needed for cramps and see if that helps.  If no improvement I asked her to contact me in a few weeks we can consider other options.  She agreed  GERD - doing really well on Dexilant at this time, will continue present regimen.  SCarolina Cellar MD [facility]

## 2020-06-22 ENCOUNTER — Ambulatory Visit (INDEPENDENT_AMBULATORY_CARE_PROVIDER_SITE_OTHER): Payer: 59

## 2020-06-22 ENCOUNTER — Ambulatory Visit: Payer: 59 | Admitting: Orthopedic Surgery

## 2020-06-22 DIAGNOSIS — M25512 Pain in left shoulder: Secondary | ICD-10-CM

## 2020-06-26 ENCOUNTER — Encounter: Payer: Self-pay | Admitting: Orthopedic Surgery

## 2020-06-26 NOTE — Progress Notes (Signed)
Office Visit Note   Patient: Emily Osborne           Date of Birth: 10-Jan-1961           MRN: [id number] Visit Date: 06/22/2020 Requested by: [id number], [place], [place] [address] [place],  [place] [address] PCP: Carollee Herter, Alferd Apa, DO  Subjective: Chief Complaint  Patient presents with  . Left Shoulder - Pain    HPI: Emily Osborne is a 59 year old female with left shoulder pain of 3 months duration.  She thought it would get better.  She works as a Marine scientist.  Couple of months ago she reached to keep the patient from falling out of bed and she had an injury to the shoulder at that time.  Tried Voltaren and heat with Tylenol.  She is right-hand dominant.  The pain does not wake her from sleep at night but she does report decreased range of motion.  She does have an allergy to anti-inflammatories.  The pain does not wake her from sleep at night.  States that the left shoulder is getting stiff.              ROS: All systems reviewed are negative as they relate to the chief complaint within the history of present illness.  Patient denies  fevers or chills.   Assessment & Plan: Visit Diagnoses:  1. Left shoulder pain, unspecified chronicity     Plan: Impression is left shoulder early adhesive capsulitis.  Plan is ultrasound-guided injection into the left shoulder with home exercise program of stretching.  Come back in 6 weeks for clinical recheck and decision for or against repeat injection at that time.  Follow-Up Instructions: Return in about 6 weeks (around 08/03/2020).   Orders:  Orders Placed This Encounter  Procedures  . XR Shoulder Left   No orders of the defined types were placed in this encounter.     Procedures: No procedures performed   Clinical Data: No additional findings.  Objective: Vital Signs: There were no vitals taken for this visit.  Physical Exam:   Constitutional: Patient appears well-developed HEENT:  Head: Normocephalic Eyes:EOM are  normal Neck: Normal range of motion Cardiovascular: Normal rate Pulmonary/chest: Effort normal Neurologic: Patient is alert Skin: Skin is warm Psychiatric: Patient has normal mood and affect    Ortho Exam: Ortho exam demonstrates good cervical spine range of motion.  Left shoulder demonstrates external rotation of 15 of abduction on the right to about 60 on the left is about 40.  Isolated glenohumeral abduction and on the right 110 on the left 80 forward flexion on the right 170 on the left 150.  Rotator cuff strength is good infraspinatus supraspinatus and subscap muscle testing on that left-hand side.  No coarse grinding or crepitus present with passive range of motion.  O'Brien's testing negative.  Specialty Comments:  No specialty comments available.  Imaging: No results found.   PMFS History: Patient Active Problem List   Diagnosis Date Noted  . Dizziness 05/23/2020  . Essential hypertension 06/11/2019  . New daily persistent headache 06/11/2019  . Memory loss 06/11/2019  . Upper airway cough syndrome vs cough variant asthma 01/01/2019  . Chest pain 12/09/2018  . Prediabetes 10/07/2018  . Preventative health care 08/07/2018  . Anxiety 10/11/2017  . S/P partial colectomy 06/18/2017  . Multiple drug allergies 06/18/2017  . Colitis 02/05/2017  . Diarrhea 02/05/2017  . Diverticular disease 01/23/2017  . Helicobacter pylori (H. pylori) infection  12/13/2016  . Routine screening for STI (sexually transmitted infection) 12/13/2016  . Clostridium difficile colitis 12/13/2016  . Diverticulitis 10/10/2016  . Lower abdominal pain 09/29/2016  . Palpitations 02/06/2016  . Mild diastolic dysfunction 54/62/7035  . Graves disease 01/02/2016  . Acute diverticulitis 12/07/2015  . Acute bacterial sinusitis 10/04/2015  . History of colonic polyps 11/15/2014  . Left shoulder pain 09/28/2013  . Left-sided weakness 09/16/2013  . Obesity (BMI 30-39.9) with low erv on pfts 09/25/19  09/02/2013  . Myalgia 06/21/2011  . POSTMENOPAUSAL STATUS 09/07/2010  . PAIN IN JOINT, MULTIPLE SITES 12/27/2009  . LOW BACK PAIN, CHRONIC 12/27/2009  . FATIGUE 12/27/2009  . VAGINITIS 11/22/2009  . DYSPEPSIA&OTHER [facility] DISORDERS FUNCTION STOMACH 09/01/2009  . NAUSEA 08/03/2009  . FLATULENCE-GAS-BLOATING 08/03/2009  . ABDOMINAL PAIN RIGHT UPPER QUADRANT 07/06/2009  . SUPRAPUBIC PAIN 07/06/2009  . GANGLION CYST, HX OF 07/06/2009  . BACK PAIN 05/04/2009  . Hyperlipidemia 09/16/2008  . UNSPECIFIED MYALGIA AND MYOSITIS 09/16/2008  . Precordial pain 07/27/2008  . GERD 06/08/2008  . HELICOBACTER PYLORI GASTRITIS, HX OF 04/05/2008  . OTHER ACUTE REACTIONS TO STRESS 03/16/2008  . ABDOMINAL PAIN, EPIGASTRIC 03/16/2008  . SHOULDER PAIN, RIGHT 10/01/2007  . ELBOW PAIN, RIGHT 10/01/2007  . Pain in limb 04/14/2007  . DEPENDENT EDEMA, RIGHT LEG 04/14/2007  . SHINGLES 04/08/2007  . Asthma 03/18/2007  . GANGLION CYST, WRIST, RIGHT 03/18/2007  . BUNIONECTOMY, HX OF 03/18/2007   Past Medical History:  Diagnosis Date  . Allergy   . Anxiety   . Asthma    pt has inhaler  . Back pain   . C. difficile colitis   . Cataract    bil eyes  . Chest pain   . Chronic fatigue syndrome   . Clostridium difficile colitis 12/13/2016  . Diverticulitis   . Dyspnea   . Dysrhythmia    palpitations  . Edema   . Elevated blood pressure reading without diagnosis of hypertension    "just elevated when I'm in pain" (12/07/2015)  . Fatty liver   . Fibromyalgia   . Ganglion of joint    right wrist  . GERD (gastroesophageal reflux disease)   . H. pylori infection   . Headache    hx of  . Helicobacter pylori (H. pylori) infection 12/13/2016  . Herpes zoster without mention of complication   . HTN (hypertension)   . Hyperlipidemia   . Hyperthyroidism   . IBS (irritable bowel syndrome)   . Leg edema   . Multiple drug allergies 06/18/2017  . Myalgia   . Nontraumatic rupture of Achilles tendon   . Other  acute reactions to stress   . Pain in joint, shoulder region   . Pain in joint, upper arm   . Pain in limb   . Palpitations   . Personal history of other diseases of digestive system   . Pneumonia    once  . PONV (postoperative nausea and vomiting)   . Routine screening for STI (sexually transmitted infection) 12/13/2016  . S/P partial colectomy 06/18/2017  . Thyroid disease    hyperthyroidism  . Type 2 diabetes mellitus with hyperglycemia, without long-term current use of insulin ([place]) 08/07/2018  . Vitamin B 12 deficiency     Family History  Problem Relation Age of Onset  . Hypertension Father   . Diabetes Father   . Heart disease Father   . Kidney disease Father        Died, 105  . Hyperlipidemia Father   .  Leukemia Brother   . Cancer Brother        myleoblastic anemia  . Hyperlipidemia Mother   . Hypertension Mother        Died, 96  . Thyroid disease Mother        Thyroid surgery  . Obesity Mother   . Heart disease Mother   . Pernicious anemia Sister   . Thyroid disease Sister        On thyroid Rx  . Lupus Daughter   . Coronary artery disease Brother   . Hypertension Brother   . Colon cancer Neg Hx   . Esophageal cancer Neg Hx   . Rectal cancer Neg Hx   . Stomach cancer Neg Hx   . Pancreatic cancer Neg Hx   . Prostate cancer Neg Hx   . Colon polyps Neg Hx     Past Surgical History:  Procedure Laterality Date  . ABDOMINAL HYSTERECTOMY    . ACHILLES TENDON REPAIR Right 2005  . BUNIONECTOMY Bilateral    Bunionectomy 1983  . COLON SURGERY  01/23/2017   6 to 8 inches sigmoid colon removed  . COLONOSCOPY    . GANGLION CYST EXCISION Right    wrist  . LEFT HEART CATH AND CORONARY ANGIOGRAPHY N/A 10/03/2016   Procedure: Left Heart Cath and Coronary Angiography;  Surgeon: Burnell Blanks, MD;  Location: [facility];  Service: Cardiovascular;  Laterality: N/A;  . MENISCUS REPAIR Right 04/2014  . POLYPECTOMY    . TUBAL LIGATION    . wisdomteeth  extraction     Social History   Occupational History  . Occupation: Nurse/teacher  Tobacco Use  . Smoking status: Never Smoker  . Smokeless tobacco: Never Used  Vaping Use  . Vaping Use: Never used  Substance and Sexual Activity  . Alcohol use: No    Alcohol/week: 0.0 standard drinks  . Drug use: No  . Sexual activity: Not Currently    Partners: Male

## 2020-06-28 ENCOUNTER — Other Ambulatory Visit: Payer: Self-pay | Admitting: Podiatry

## 2020-06-28 MED FILL — DEXILANT DR 60 MG CAPSULE: 60 | 90 days supply | Qty: 90 | Fill #2

## 2020-06-29 ENCOUNTER — Ambulatory Visit: Payer: 59 | Admitting: Family Medicine

## 2020-06-29 NOTE — Telephone Encounter (Signed)
Please advise 

## 2020-07-06 ENCOUNTER — Telehealth: Payer: Self-pay

## 2020-07-06 ENCOUNTER — Other Ambulatory Visit: Payer: Self-pay | Admitting: Surgical

## 2020-07-06 NOTE — Telephone Encounter (Signed)
Please see below.

## 2020-07-06 NOTE — Telephone Encounter (Signed)
I called and sw pt and advised of message below. To call with any questions.

## 2020-07-06 NOTE — Telephone Encounter (Signed)
Cannot refill her topical voltaren, it is just OTC now so she can just pick it up from any pharmacy

## 2020-07-06 NOTE — Telephone Encounter (Signed)
Patent called she is requesting Rx refill for diclofenac [phone]

## 2020-07-08 MED FILL — FLUTICASONE PROP 50 MCG SPR: 50 | 30 days supply | Qty: 16 | Fill #2

## 2020-08-23 ENCOUNTER — Ambulatory Visit: Payer: 59 | Admitting: Family Medicine

## 2020-08-23 DIAGNOSIS — Z03818 Encounter for observation for suspected exposure to other biological agents ruled out: Secondary | ICD-10-CM | POA: Diagnosis not present

## 2020-08-24 MED FILL — LEVOCETIRIZINE 5 MG TABLET: 5 | 90 days supply | Qty: 90 | Fill #0

## 2020-08-31 ENCOUNTER — Other Ambulatory Visit: Payer: Self-pay | Admitting: Family Medicine

## 2020-08-31 DIAGNOSIS — J302 Other seasonal allergic rhinitis: Secondary | ICD-10-CM

## 2020-08-31 MED FILL — FLUTICASONE PROP 50 MCG SPR: 50 | 30 days supply | Qty: 16 | Fill #0

## 2020-09-08 ENCOUNTER — Ambulatory Visit: Payer: 59 | Admitting: Neurology

## 2020-09-08 ENCOUNTER — Encounter: Payer: Self-pay | Admitting: Neurology

## 2020-09-08 ENCOUNTER — Other Ambulatory Visit: Payer: Self-pay

## 2020-09-08 VITALS — BP 111/74 | HR 72 | Ht 65.0 in | Wt 240.0 lb

## 2020-09-08 DIAGNOSIS — G43809 Other migraine, not intractable, without status migrainosus: Secondary | ICD-10-CM | POA: Diagnosis not present

## 2020-09-08 NOTE — Progress Notes (Signed)
[facility] Clinic Note - Initial Visit   Date: 09/08/20  Emily Osborne MRN: [id number] DOB: Jun 03, 1961   Dear Dr. Laury Axon:  Thank you for your kind referral of Emily Osborne for consultation of vision changes. Although her history is well known to you, please allow [place] to reiterate it for the purpose of our medical record. The patient was accompanied to the clinic by self.   History of Present Illness: Emily Osborne is a 60 y.o. right-handed female with asthma, fibromyalgia, GERD, and chronic left shoulder pain referred from the ER for vision changes.  She went to the ER on 10/15 with right facial numbness x 1 day, right eye vision changes x < 5 minutes, and ongoing dizziness where MRI brain was unremarkable.  Neurology evaluated the patient and ordered EEG which showed left temporal slowing, no epileptiform discharged, similar to prior study in 2015. She was evaluated by her eye doctor and diagnosed with ocular migraine. She had not had any recurrence of symptoms, except continues to have episodic spells of dizziness, which has been long standing.   She has seen me in 2015 and again in 2017 for left sided weakness, paresthesias, and dizziness.  She had an extensive work-up with MRI brain, CTA head/neck, MRI cervical spine, EEG, and NCS/EMG of the left arm which did not show any pathology stemming of the nervous system to explain her symptoms. She has ongoing left achy/throbbing shoulder pain, described as bone pain.    She has been working at [employer] as a Engineer, civil (consulting) on the orthopaedic floor.  She works on the weekends to be able to care for her daughter with mental health issues.   Out-side paper records, electronic medical record, and images have been reviewed where available and summarized as:  MRI brain wo contrast 06/03/2020: 1. No acute intracranial abnormality. 2. Stable since 2017 and largely unremarkable for age noncontrast MRI appearance of the brain. Mild  nonspecific white matter changes. Mild intracranial artery tortuosity.  EEG 06/03/2020: 1) Rare theta slow activity in the left frontotemporal region.  Clinical Interpretation: This EEG is suggestive of a nonspecific focal dysfunction in the left temporal region. This is similar to what was described with her previous EEG in 2015.   EMG left upper extremity 12/03/2013:  This is a normal study of the left upper extremity. In particular, there is no evidence of cervical radiculopathy or carpal tunnel syndrome.  CT/A head and neck 11/03/2013:   Normal CT of the head.  Normal CTA of the head and neck. No evidence of atherosclerotic  disease or dissection.  MRI cervical spine wo contrast 10/29/2013:  Minimal degenerative changes in the cervical spine, stable. No neural impingement.  MRI shoulder 09/25/2013: 1. Degenerative glenohumeral arthropathy with chondral thinning and multiple degenerative subcortical cystic lesions along the glenoid.  2. Mild blunting of the anterior and posterior labrum, but without a well-defined tear.  3. Mild rotator cuff tendinopathy, with a partial-thickness intrasubstance insertional tear of the distal posterior supraspinatus.  MRI brain 09/17/2013: 1. Periventricular and subcortical white matter disease is advanced for age. The finding is nonspecific but can be seen in the setting of chronic microvascular ischemia, a demyelinating process such as multiple sclerosis, vasculitis, complicated migraine headaches, or as the sequelae of a prior infectious or inflammatory process.  2. No acute intracranial abnormality.  3. Right greater than left maxillary sinus disease.  Routine EEG 12/22/2013:  This awake and asleep EEG is mildly abnormal due to rare  focal slowing over the left temporal region.  CT/A head and neck 11/03/2013:  Normal CT of the head.  Normal CTA of the head and neck. No evidence of atherosclerotic  disease or dissection.  MRI brain 11/02/2015:  Mild  chronic white matter changes stable since 2015. No acute abnormality.  Lab Results  Component Value Date   HGBA1C 5.8 08/31/2019   Lab Results  Component Value Date   VITAMINB12 780 05/23/2020   Lab Results  Component Value Date   TSH 1.02 05/23/2020   Lab Results  Component Value Date   ESRSEDRATE 16 06/11/2019    Past Medical History:  Diagnosis Date  . Allergy   . Anxiety   . Asthma    pt has inhaler  . Back pain   . C. difficile colitis   . Cataract    bil eyes  . Chest pain   . Chronic fatigue syndrome   . Clostridium difficile colitis 12/13/2016  . Diverticulitis   . Dyspnea   . Dysrhythmia    palpitations  . Edema   . Elevated blood pressure reading without diagnosis of hypertension    "just elevated when I'm in pain" (12/07/2015)  . Fatty liver   . Fibromyalgia   . Ganglion of joint    right wrist  . GERD (gastroesophageal reflux disease)   . H. pylori infection   . Headache    hx of  . Helicobacter pylori (H. pylori) infection 12/13/2016  . Herpes zoster without mention of complication   . HTN (hypertension)   . Hyperlipidemia   . Hyperthyroidism   . IBS (irritable bowel syndrome)   . Leg edema   . Multiple drug allergies 06/18/2017  . Myalgia   . Nontraumatic rupture of Achilles tendon   . Other acute reactions to stress   . Pain in joint, shoulder region   . Pain in joint, upper arm   . Pain in limb   . Palpitations   . Personal history of other diseases of digestive system   . Pneumonia    once  . PONV (postoperative nausea and vomiting)   . Routine screening for STI (sexually transmitted infection) 12/13/2016  . S/P partial colectomy 06/18/2017  . Thyroid disease    hyperthyroidism  . Type 2 diabetes mellitus with hyperglycemia, without long-term current use of insulin (HCC) 08/07/2018  . Vitamin B 12 deficiency     Past Surgical History:  Procedure Laterality Date  . ABDOMINAL HYSTERECTOMY    . ACHILLES TENDON REPAIR Right 2005   . BUNIONECTOMY Bilateral    Bunionectomy 1983  . COLON SURGERY  01/23/2017   6 to 8 inches sigmoid colon removed  . COLONOSCOPY    . GANGLION CYST EXCISION Right    wrist  . LEFT HEART CATH AND CORONARY ANGIOGRAPHY N/A 10/03/2016   Procedure: Left Heart Cath and Coronary Angiography;  Surgeon: Kathleene Hazel, MD;  Location: [facility] INVASIVE CV LAB;  Service: Cardiovascular;  Laterality: N/A;  . MENISCUS REPAIR Right 04/2014  . POLYPECTOMY    . TUBAL LIGATION    . wisdomteeth extraction       Medications:  Outpatient Encounter Medications as of 09/08/2020  Medication Sig  . acetaminophen (TYLENOL) 500 MG tablet Take 1,000 mg by mouth every 6 (six) hours as needed for moderate pain.  Marland Kitchen albuterol (PROAIR HFA) 108 (90 Base) MCG/ACT inhaler USE 2 PUFFS EVERY 4 HOURS AS NEEDED FOR COUGH, WHEEZE OR SHORTNESS OF BREATH.  Marland Kitchen ALPRAZolam (XANAX) 0.25  MG tablet TAKE 1 TABLET BY MOUTH THREE TIMES A DAY AS NEEDED  . AMBULATORY NON FORMULARY MEDICATION Medication Name: [facility] Osborne representative  Apply to affected area as needed  . azelastine (OPTIVAR) 0.05 % ophthalmic solution Place 1 drop into both eyes 2 (two) times daily as needed.  . Blood Glucose Monitoring Suppl (FREESTYLE FREEDOM) KIT 1 Device by Does not apply route daily.  . budesonide-formoterol (SYMBICORT) 80-4.5 MCG/ACT inhaler INHALE 2 PUFFS BY MOUTH FIRST THING IN THE MORNING AND THEN ANOTHER 2 PUFFS ABOUT 12 HOURS LATER  . dexlansoprazole (DEXILANT) 60 MG capsule TAKE 1 CAPSULE (60 MG TOTAL) BY MOUTH DAILY.  Marland Kitchen diclofenac sodium (VOLTAREN) 1 % GEL Apply 2 g topically 4 (four) times daily. Rub into affected area of foot 2 to 4 times daily  . dicyclomine (BENTYL) 10 MG capsule Take 1 capsule (10 mg total) by mouth every 8 (eight) hours as needed for spasms.  . famotidine (PEPCID) 20 MG tablet One after supper  . fluticasone (FLONASE) 50 MCG/ACT nasal spray PLACE 2 SPRAYS INTO BOTH  NOSTRILS DAILY.  Marland Kitchen glucose blood (FREESTYLE LITE) test strip USE TO CHECK BLOOD SUGAR ONCE A DAY  . Iron Combinations (I.L.X. B-12) ELIX Take 1 each by mouth daily as needed (energy). 1 droplet full  . levocetirizine (XYZAL) 5 MG tablet Take 1 tablet (5 mg total) by mouth every evening.  . lidocaine (LIDODERM) 5 % Apply to affected area QD PRN for a Week  . valACYclovir (VALTREX) 1000 MG tablet Take 1,000 mg by mouth 3 (three) times daily as needed.  . Vitamin D, Ergocalciferol, (DRISDOL) 1.25 MG (50000 UNIT) CAPS capsule TAKE 1 CAPSULE (50,000 UNITS TOTAL) BY MOUTH EVERY 7 (SEVEN) DAYS.  . rifaximin (XIFAXAN) 550 MG TABS tablet Take 1 tablet (550 mg total) by mouth 3 (three) times daily. (Patient not taking: Reported on 09/08/2020)   No facility-administered encounter medications on file as of 09/08/2020.    Allergies:  Allergies  Allergen Reactions  . Aspirin Other (See Comments)    REACTION: anaphylaxis  . Gentamicin Other (See Comments)    Eye drops turned the sclera bright red  . Ibuprofen Other (See Comments)    REACTION: anaphylaxsis  . Metronidazole Swelling    REACTION: red face/swelling  . Nsaids Other (See Comments)    REACTION: anaphylaxis  . Doxycycline Other (See Comments)    REACTION: severe nausea/vomiting  . Influenza A (H1n1) Monoval Vac Other (See Comments)    REACTION: sick for 3 weeks  . Loratadine Other (See Comments)    Fatigue/weakness  . Periactin [Cyproheptadine] Other (See Comments)    paranoid   . Ranitidine Hcl Other (See Comments)    REACTION: Lips turned red /peel  . Sulfamethoxazole-Trimethoprim Other (See Comments)    REACTION: face red/peel  . Tramadol Hcl Other (See Comments)    REACTION: paranoid    Family History: Family History  Problem Relation Age of Onset  . Hypertension Father   . Diabetes Father   . Heart disease Father   . Kidney disease Father        Died, 68  . Hyperlipidemia Father   . Leukemia Brother   . Cancer Brother         myleoblastic anemia  . Hyperlipidemia Mother   . Hypertension Mother        Died, 60  . Thyroid disease Mother        Thyroid surgery  . Obesity Mother   .  Heart disease Mother   . Pernicious anemia Sister   . Thyroid disease Sister        On thyroid Rx  . Lupus Daughter   . Coronary artery disease Brother   . Hypertension Brother   . Colon cancer Neg Hx   . Esophageal cancer Neg Hx   . Rectal cancer Neg Hx   . Stomach cancer Neg Hx   . Pancreatic cancer Neg Hx   . Prostate cancer Neg Hx   . Colon polyps Neg Hx     Social History: Social History   Tobacco Use  . Smoking status: Never Smoker  . Smokeless tobacco: Never Used  Vaping Use  . Vaping Use: Never used  Substance Use Topics  . Alcohol use: No    Alcohol/week: 0.0 standard drinks  . Drug use: No   Social History   Social History Narrative   Lives with alone.  She has two daughter.   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  Speech is not dysarthric.  CRANIAL NERVES: II:  No visual field defects.   III-IV-VI: Pupils equal round and reactive to light.  Normal conjugate, extra-ocular eye movements in all directions of gaze.  No nystagmus.  No ptosis.   V:  Normal facial sensation.    VII:  Normal facial symmetry and movements.   VIII:  Normal hearing and vestibular function.   IX-X:  Normal palatal movement.   XI:  Normal shoulder shrug and head rotation.   XII:  Normal tongue strength and range of motion, no deviation or fasciculation.  MOTOR:  No atrophy, fasciculations or abnormal movements.  No pronator drift.   Upper Extremity:  Right  Left  Deltoid  5/5   5/5    Biceps  5/5   5/5   Triceps  5/5   5/5   Infraspinatus 5/5  5/5  Medial pectoralis 5/5  5/5  Wrist extensors  5/5   5/5   Wrist flexors  5/5   5/5   Finger extensors  5/5   5/5   Finger flexors  5/5   5/5   Dorsal interossei  5/5   5/5   Abductor pollicis  5/5   5/5   Tone (Ashworth scale)  0  0   Lower Extremity:  Right  Left  Hip flexors  5/5   5/5   Hip extensors  5/5   5/5   Adductor 5/5  5/5  Abductor 5/5  5/5  Knee flexors  5/5   5/5   Knee extensors  5/5   5/5   Dorsiflexors  5/5   5/5   Plantarflexors  5/5   5/5   Toe extensors  5/5   5/5   Toe flexors  5/5   5/5   Tone (Ashworth scale)  0  0   MSRs:  Right        Left                  brachioradialis 2+  2+  biceps 2+  2+  triceps 2+  2+  patellar 2+  2+  ankle jerk 2+  2+  Hoffman no  no  plantar response down  down   SENSORY:  Normal and symmetric perception of light touch, pinprick, vibration, and proprioception.  Romberg's sign absent.   COORDINATION/GAIT: Normal finger-to- nose-finger.  Intact rapid alternating movements bilaterally.   Gait narrow based and stable. Tandem and stressed gait intact.    IMPRESSION: 1. Migraine variant manifesting with right facial numbness and vision changes, no recurrence since 06/03/2020.  Work-up at the hospital has been extensive and unremarkable.  Exam is normal.  Reassurance provided.  Continue to monitor.  2. Dizziness has been long standing issue.  Neurological evaluation (MRI brain, CTA head/neck) has been negative.    3. Left arm pain seems musculoskeletal.  Prior MRI cervical spine and NCS/EMG has been unremarkable.    Return to clinic as needed   Thank you for allowing me to participate in patient's care.  If I can answer any additional questions, I would be pleased to do so.    Sincerely,    Angelyna Henderson K. Allena Katz, DO

## 2020-09-09 ENCOUNTER — Encounter: Payer: 59 | Admitting: Family Medicine

## 2020-09-21 ENCOUNTER — Ambulatory Visit: Payer: 59 | Admitting: Orthopedic Surgery

## 2020-09-30 ENCOUNTER — Encounter: Payer: 59 | Admitting: Family Medicine

## 2020-10-08 ENCOUNTER — Telehealth: Payer: 59 | Admitting: Physician Assistant

## 2020-10-08 DIAGNOSIS — U099 Post covid-19 condition, unspecified: Secondary | ICD-10-CM | POA: Diagnosis not present

## 2020-10-08 NOTE — Progress Notes (Signed)
I have spent 5 minutes in review of e-visit questionnaire, review and updating patient chart, medical decision making and response to patient.   Makenlee Mckeag Cody Jeanelle Dake, PA-C    

## 2020-10-08 NOTE — Progress Notes (Signed)
Based on what you have shared with me, you need to seek an "in-person" evaluation for respiratory symptoms related to Covid 19. Giving the duration and severity of symptoms we need to make sure no concern for covid pneumonia or other covid complications from covid to explain ongoing symptoms since positive test on 09/23/20.  Please call for an appointment:   Respiratory Clinic at [facility] [address] [place], [place]. Monday, Wednesday, Friday 5:30PM to 7:30PM  If you have severe symptoms of any kind, please seek medical care at an emergency room. Our Emergency Departments are best equipped to handle patients with severe symptoms.    . [facility] Emergency Department [place], [place], [place] [address] [phone]  . [facility] Emergency Department [place], [place], [place] [address] [phone]  . [facility] Emergency Department [place], [place], [place] [address] [phone]  . [facility] Emergency Department [address] [place], [place], [place] [address] [phone]  . [facility] Emergency Department [place], [place], [place] [address] [phone]   If you are having a true medical emergency please call 911.  NOTE: If you entered your credit card information for this eVisit, you will not be charged. You may see a "hold" on your card for the $35 but that hold will drop off and you will not have a charge processed.   Your e-visit answers were reviewed by a board certified advanced clinical practitioner to complete your personal care plan.  Thank you for using e-Visits

## 2020-10-10 ENCOUNTER — Telehealth: Payer: Self-pay | Admitting: Family Medicine

## 2020-10-10 NOTE — Telephone Encounter (Signed)
Pt needs virtual to go over FMLA forms to be completed.

## 2020-10-10 NOTE — Telephone Encounter (Signed)
FMLA paperwork faxed into front office  Put into [employer] bin up front

## 2020-10-10 NOTE — Telephone Encounter (Signed)
Called patient to schedule appt  LVM to return call

## 2020-10-12 ENCOUNTER — Encounter: Payer: Self-pay | Admitting: Family Medicine

## 2020-10-12 ENCOUNTER — Telehealth: Payer: Self-pay | Admitting: Family Medicine

## 2020-10-12 ENCOUNTER — Telehealth (INDEPENDENT_AMBULATORY_CARE_PROVIDER_SITE_OTHER): Payer: 59 | Admitting: Family Medicine

## 2020-10-12 ENCOUNTER — Other Ambulatory Visit: Payer: Self-pay

## 2020-10-12 DIAGNOSIS — U071 COVID-19: Secondary | ICD-10-CM | POA: Diagnosis not present

## 2020-10-12 NOTE — Telephone Encounter (Signed)
Forms faxed into front office  Place into [employer] folder up front

## 2020-10-12 NOTE — Telephone Encounter (Signed)
Pt has virtual this morning to have forms filled out. Forms currently with [place] at home

## 2020-10-12 NOTE — Assessment & Plan Note (Signed)
fmla filled out  Pt to see post covid clinic Monday and go back to work next week Saturday

## 2020-10-12 NOTE — Progress Notes (Signed)
Virtual Visit via Video Note  I connected with Emily Osborne on 10/12/20 at  9:00 AM EST by a video enabled telemedicine application and verified that I am speaking with the correct person using two identifiers.  Location/ participants in ov Patient: home alone  Provider: home    I discussed the limitations of evaluation and management by telemedicine and the availability of in person appointments. The patient expressed understanding and agreed to proceed.  History of Present Illness: Pt is home -- she was dx with covid on 2/4 and had e visit on 2/19.  She was told to see post covid clinic and has app on Monday --- her work will not let her go back until she sees post covid clinic and gets released.  She needs fmla filled out  She is feeling much better.   She had runny nose,  Pain in mouth and sob but it has almost completely resolved   She would like to go back to work after she sees covid clinic   Observations/Objective:  There were no vitals filed for this visit.  Pt is in nad  No sob  Assessment and Plan: 1. COVID-19 fmla filled out  Pt to see post covid clinic Monday and go back to work next week Saturday  Follow Up Instructions:    I discussed the assessment and treatment plan with the patient. The patient was provided an opportunity to ask questions and all were answered. The patient agreed with the plan and demonstrated an understanding of the instructions.   The patient was advised to call back or seek an in-person evaluation if the symptoms worsen or if the condition fails to improve as anticipated.     [name], DO

## 2020-10-13 ENCOUNTER — Telehealth: Payer: Self-pay | Admitting: Gastroenterology

## 2020-10-13 ENCOUNTER — Other Ambulatory Visit: Payer: Self-pay | Admitting: Gastroenterology

## 2020-10-13 DIAGNOSIS — K219 Gastro-esophageal reflux disease without esophagitis: Secondary | ICD-10-CM

## 2020-10-13 MED FILL — DEXLANSOPRAZOLE 60 MG CPDR: 60 | 90 days supply | Qty: 90 | Fill #0

## 2020-10-13 MED FILL — VIT D2 1.25 MG (50,000 UNIT: 1.25 MG | 84 days supply | Qty: 12 | Fill #1

## 2020-10-13 MED FILL — FLUTICASONE PROP 50 MCG SPR: 50 | 30 days supply | Qty: 16 | Fill #0

## 2020-10-13 NOTE — Telephone Encounter (Signed)
Form was successfully faxed.

## 2020-10-13 NOTE — Telephone Encounter (Signed)
Inbound call from patient stating Dexilant medication needs a prior authorization for her insurance please.

## 2020-10-14 ENCOUNTER — Other Ambulatory Visit (HOSPITAL_COMMUNITY): Payer: Self-pay | Admitting: Optometry

## 2020-10-14 DIAGNOSIS — H1045 Other chronic allergic conjunctivitis: Secondary | ICD-10-CM | POA: Diagnosis not present

## 2020-10-14 MED FILL — LOTEMAX SM 0.38 % GEL: 0.38 | 50 days supply | Qty: 5 | Fill #0 | Status: TO

## 2020-10-14 MED FILL — LOTEMAX SM 0.38 % GEL: 0.38 | 50 days supply | Qty: 5 | Fill #0

## 2020-10-14 NOTE — Telephone Encounter (Signed)
PA approved. Key: [id number] - PA Case ID: [id number]. Approved through 11-25-20. See above for last years PA approval.  Left message for pt to return call.

## 2020-10-16 NOTE — Telephone Encounter (Signed)
CoverMyMeds Key: [id number] PA request for Dexilant 60 mg once daily sent as renewal.

## 2020-10-17 ENCOUNTER — Other Ambulatory Visit: Payer: Self-pay

## 2020-10-17 ENCOUNTER — Other Ambulatory Visit: Payer: Self-pay | Admitting: Nurse Practitioner

## 2020-10-17 ENCOUNTER — Ambulatory Visit
Admission: RE | Admit: 2020-10-17 | Discharge: 2020-10-17 | Disposition: A | Discharge status: Home / Self Care | Payer: 59 | Source: Ambulatory Visit | Attending: Nurse Practitioner | Admitting: Nurse Practitioner

## 2020-10-17 ENCOUNTER — Ambulatory Visit (INDEPENDENT_AMBULATORY_CARE_PROVIDER_SITE_OTHER): Payer: 59 | Admitting: Nurse Practitioner

## 2020-10-17 VITALS — BP 123/94 | HR 82 | Temp 98.1°F

## 2020-10-17 DIAGNOSIS — R0602 Shortness of breath: Secondary | ICD-10-CM | POA: Insufficient documentation

## 2020-10-17 DIAGNOSIS — Z8616 Personal history of COVID-19: Secondary | ICD-10-CM

## 2020-10-17 MED ORDER — AZITHROMYCIN 250 MG PO TABS: ORAL_TABLET | ORAL | 0 refills | Status: DC

## 2020-10-17 MED ORDER — PREDNISONE 20 MG PO TABS: 20.0000 mg | ORAL_TABLET | Freq: Every day | ORAL | 0 refills | Status: DC

## 2020-10-17 MED FILL — AZITHROMYCIN 250 MG TABLET: 250 | 5 days supply | Qty: 6 | Fill #0

## 2020-10-17 MED FILL — predniSONE 20 MG TABS: 20 | 5 days supply | Qty: 5 | Fill #0

## 2020-10-17 NOTE — Patient Instructions (Addendum)
Covid 19 Shortness of Breath:   Stay well hydrated  Stay active  Deep breathing exercises  May start vitamin C daily, vitamin D3 daily, Zinc daily  May take tylenol for fever or pain  May take mucinex  twice daily  Will order chest x ray:  [facility] [address], [place] [address] [phone] MON - FRI 8:00 AM - 4:00 PM - WALK IN   Follow up:  Follow up in 2 weeks or sooner if needed

## 2020-10-17 NOTE — Progress Notes (Signed)
@Patient  ID: Emily Osborne, female    DOB: 1961/06/03, 60 y.o.   MRN: [id number]  Chief Complaint  Patient presents with  . Covid Positive    Slowly improving, history of asthma    Referring provider: [name], *    HPI  Patient presents today for post COVID care clinic visit.  Patient was diagnosed with Covid on 09/23/2020.  Patient states that she does have a history of asthma and is currently on Symbicort.  She states that she is ready to return to work.  She states that she does have good days and bad days.  She is still having some shortness of breath with exertion.  She is still having fatigue.  Her daughter was recently diagnosed with PE after diagnosis of Covid.  We discussed that we will check a D-dimer and chest x-ray today. Denies f/c/s, n/v/d, hemoptysis, PND, chest pain or edema.     Allergies  Allergen Reactions  . Aspirin Other (See Comments)    REACTION: anaphylaxis  . Gentamicin Other (See Comments)    Eye drops turned the sclera bright red  . Ibuprofen Other (See Comments)    REACTION: anaphylaxsis  . Metronidazole Swelling    REACTION: red face/swelling  . Nsaids Other (See Comments)    REACTION: anaphylaxis  . Doxycycline Other (See Comments)    REACTION: severe nausea/vomiting  . Influenza A (H1n1) Monoval Vac Other (See Comments)    REACTION: sick for 3 weeks  . Loratadine Other (See Comments)    Fatigue/weakness  . Periactin [Cyproheptadine] Other (See Comments)    paranoid   . Ranitidine Hcl Other (See Comments)    REACTION: Lips turned red /peel  . Sulfamethoxazole-Trimethoprim Other (See Comments)    REACTION: face red/peel  . Tramadol Hcl Other (See Comments)    REACTION: paranoid    Immunization History  Administered Date(s) Administered  . Hepatitis A, Adult 06/20/2017  . IPV 07/01/2017  . Influenza Whole 05/05/2008  . Meningococcal Mcv4o 06/20/2017  . PFIZER(Purple Top)SARS-COV-2 Vaccination 09/01/2019, 09/22/2019,  05/23/2020  . PPD Test 07/02/2011, 07/07/2012, 07/13/2013, 07/06/2014, 08/11/2015, 09/15/2016  . Pneumococcal Polysaccharide-23 09/21/2011  . Td 09/27/1999, 09/07/2010  . Tdap 07/01/2017  . Typhoid Inactivated 06/17/2017  . Zoster 06/20/2012  . Zoster Recombinat (Shingrix) 06/10/2017, 12/16/2017    Past Medical History:  Diagnosis Date  . Allergy   . Anxiety   . Asthma    pt has inhaler  . Back pain   . C. difficile colitis   . Cataract    bil eyes  . Chest pain   . Chronic fatigue syndrome   . Clostridium difficile colitis 12/13/2016  . Diverticulitis   . Dyspnea   . Dysrhythmia    palpitations  . Edema   . Elevated blood pressure reading without diagnosis of hypertension    "just elevated when I'm in pain" (12/07/2015)  . Fatty liver   . Fibromyalgia   . Ganglion of joint    right wrist  . GERD (gastroesophageal reflux disease)   . H. pylori infection   . Headache    hx of  . Helicobacter pylori (H. pylori) infection 12/13/2016  . Herpes zoster without mention of complication   . HTN (hypertension)   . Hyperlipidemia   . Hyperthyroidism   . IBS (irritable bowel syndrome)   . Leg edema   . Multiple drug allergies 06/18/2017  . Myalgia   . Nontraumatic rupture of Achilles tendon   . Other acute  reactions to stress   . Pain in joint, shoulder region   . Pain in joint, upper arm   . Pain in limb   . Palpitations   . Personal history of other diseases of digestive system   . Pneumonia    once  . PONV (postoperative nausea and vomiting)   . Routine screening for STI (sexually transmitted infection) 12/13/2016  . S/P partial colectomy 06/18/2017  . Thyroid disease    hyperthyroidism  . Type 2 diabetes mellitus with hyperglycemia, without long-term current use of insulin ([place]) 08/07/2018  . Vitamin B 12 deficiency     Tobacco History: Social History   Tobacco Use  Smoking Status Never Smoker  Smokeless Tobacco Never Used   Counseling given:  Yes   Outpatient Encounter Medications as of 10/17/2020  Medication Sig  . azithromycin (ZITHROMAX) 250 MG tablet Take 2 tablets (500 mg) on day 1, then take 1 tablet (250 mg) on days 2-5  . predniSONE (DELTASONE) 20 MG tablet Take 1 tablet (20 mg total) by mouth daily with breakfast for 5 days.  Marland Kitchen acetaminophen (TYLENOL) 500 MG tablet Take 1,000 mg by mouth every 6 (six) hours as needed for moderate pain.  Marland Kitchen albuterol (PROAIR HFA) 108 (90 Base) MCG/ACT inhaler USE 2 PUFFS EVERY 4 HOURS AS NEEDED FOR COUGH, WHEEZE OR SHORTNESS OF BREATH.  Marland Kitchen ALPRAZolam (XANAX) 0.25 MG tablet TAKE 1 TABLET BY MOUTH THREE TIMES A DAY AS NEEDED  . AMBULATORY NON FORMULARY MEDICATION Medication Name: [facility]  Apply to affected area as needed  . azelastine (OPTIVAR) 0.05 % ophthalmic solution Place 1 drop into both eyes 2 (two) times daily as needed.  . Blood Glucose Monitoring Suppl (FREESTYLE FREEDOM) KIT 1 Device by Does not apply route daily.  . budesonide-formoterol (SYMBICORT) 80-4.5 MCG/ACT inhaler INHALE 2 PUFFS BY MOUTH FIRST THING IN THE MORNING AND THEN ANOTHER 2 PUFFS ABOUT 12 HOURS LATER  . DEXILANT 60 MG capsule TAKE 1 CAPSULE (60 MG TOTAL) BY MOUTH DAILY.  Marland Kitchen diclofenac sodium (VOLTAREN) 1 % GEL Apply 2 g topically 4 (four) times daily. Rub into affected area of foot 2 to 4 times daily  . dicyclomine (BENTYL) 10 MG capsule Take 1 capsule (10 mg total) by mouth every 8 (eight) hours as needed for spasms.  . famotidine (PEPCID) 20 MG tablet One after supper  . fluticasone (FLONASE) 50 MCG/ACT nasal spray PLACE 2 SPRAYS INTO BOTH NOSTRILS DAILY.  Marland Kitchen glucose blood (FREESTYLE LITE) test strip USE TO CHECK BLOOD SUGAR ONCE A DAY  . Iron Combinations (I.L.X. B-12) ELIX Take 1 each by mouth daily as needed (energy). 1 droplet full  . levocetirizine (XYZAL) 5 MG tablet Take 1 tablet (5 mg total) by mouth every evening.  . lidocaine (LIDODERM) 5 %  Apply to affected area QD PRN for a Week  . rifaximin (XIFAXAN) 550 MG TABS tablet Take 1 tablet (550 mg total) by mouth 3 (three) times daily.  . valACYclovir (VALTREX) 1000 MG tablet Take 1,000 mg by mouth 3 (three) times daily as needed.  . Vitamin D, Ergocalciferol, (DRISDOL) 1.25 MG (50000 UNIT) CAPS capsule TAKE 1 CAPSULE (50,000 UNITS TOTAL) BY MOUTH EVERY 7 (SEVEN) DAYS.   No facility-administered encounter medications on file as of 10/17/2020.     Review of Systems  Review of Systems  Constitutional: Positive for fatigue.  Respiratory: Positive for shortness of breath.   Cardiovascular: Negative for chest pain, palpitations and leg  swelling.       Physical Exam  BP (!) 123/94   Pulse 82   Temp 98.1 F (36.7 C)   SpO2 99% Comment: RA  Wt Readings from Last 5 Encounters:  09/08/20 240 lb (108.9 kg)  06/21/20 246 lb 9.6 oz (111.9 kg)  06/03/20 242 lb (109.8 kg)  05/23/20 242 lb (109.8 kg)  09/25/19 233 lb 3.2 oz (105.8 kg)     Physical Exam Vitals and nursing note reviewed.  Constitutional:      General: She is not in acute distress.    Appearance: She is well-developed and well-nourished.  Cardiovascular:     Rate and Rhythm: Normal rate and regular rhythm.  Pulmonary:     Effort: Pulmonary effort is normal.     Breath sounds: Normal breath sounds.  Musculoskeletal:     Right lower leg: No edema.     Left lower leg: No edema.  Neurological:     Mental Status: She is alert and oriented to person, place, and time.  Psychiatric:        Mood and Affect: Mood and affect and mood normal.        Behavior: Behavior normal.        Assessment & Plan:   History of COVID-19 Shortness of Breath:   Stay well hydrated  Stay active  Deep breathing exercises  May start vitamin C daily, vitamin D3 daily, Zinc daily  May take tylenol for fever or pain  May take mucinex  twice daily  Will order chest x ray:  [facility] [address], [place] [address] [phone] MON - FRI 8:00 AM - 4:00 PM - WALK IN   Follow up:  Follow up in 2 weeks or sooner if needed      Fenton Foy, NP 10/17/2020

## 2020-10-17 NOTE — Assessment & Plan Note (Signed)
Shortness of Breath:   Stay well hydrated  Stay active  Deep breathing exercises  May start vitamin C daily, vitamin D3 daily, Zinc daily  May take tylenol for fever or pain  May take mucinex  twice daily  Will order chest x ray:  [facility] [address], [place] [address] [phone] MON - FRI 8:00 AM - 4:00 PM - WALK IN   Follow up:  Follow up in 2 weeks or sooner if needed

## 2020-10-18 ENCOUNTER — Telehealth: Payer: Self-pay

## 2020-10-18 LAB — D-DIMER, QUANTITATIVE: D-DIMER: 0.45 mg/L FEU (ref 0.00–0.49)

## 2020-10-18 NOTE — Telephone Encounter (Signed)
Telephone call and spoke with patient. Informed of results for lab and x-rays.

## 2020-10-18 NOTE — Progress Notes (Signed)
Done

## 2020-10-18 NOTE — Telephone Encounter (Signed)
Telephone call to patient with lab and x-ray results.Spoke with patient, and

## 2020-10-18 NOTE — Telephone Encounter (Signed)
Received denial from [employer]: "There is an approved authorization on file for dexlansoprazole with 8 refills remaining effective 11-27-19 through 11-25-20, please reference authorization [id number]". [phone]

## 2020-10-21 ENCOUNTER — Encounter: Payer: Self-pay | Admitting: Family Medicine

## 2020-11-08 ENCOUNTER — Encounter: Payer: 59 | Admitting: Family Medicine

## 2020-11-10 DIAGNOSIS — H524 Presbyopia: Secondary | ICD-10-CM | POA: Diagnosis not present

## 2020-11-10 DIAGNOSIS — H5213 Myopia, bilateral: Secondary | ICD-10-CM | POA: Diagnosis not present

## 2020-11-16 ENCOUNTER — Other Ambulatory Visit: Payer: Self-pay

## 2020-11-17 ENCOUNTER — Other Ambulatory Visit: Payer: Self-pay

## 2020-11-17 ENCOUNTER — Ambulatory Visit (INDEPENDENT_AMBULATORY_CARE_PROVIDER_SITE_OTHER): Payer: 59 | Admitting: Family Medicine

## 2020-11-17 ENCOUNTER — Other Ambulatory Visit: Payer: Self-pay | Admitting: Family Medicine

## 2020-11-17 ENCOUNTER — Encounter: Payer: Self-pay | Admitting: Family Medicine

## 2020-11-17 VITALS — BP 110/78 | HR 74 | Temp 98.8°F | Wt 243.0 lb

## 2020-11-17 DIAGNOSIS — Z0000 Encounter for general adult medical examination without abnormal findings: Secondary | ICD-10-CM | POA: Diagnosis not present

## 2020-11-17 DIAGNOSIS — I10 Essential (primary) hypertension: Secondary | ICD-10-CM | POA: Diagnosis not present

## 2020-11-17 DIAGNOSIS — E559 Vitamin D deficiency, unspecified: Secondary | ICD-10-CM

## 2020-11-17 DIAGNOSIS — K219 Gastro-esophageal reflux disease without esophagitis: Secondary | ICD-10-CM | POA: Diagnosis not present

## 2020-11-17 DIAGNOSIS — J302 Other seasonal allergic rhinitis: Secondary | ICD-10-CM

## 2020-11-17 DIAGNOSIS — E119 Type 2 diabetes mellitus without complications: Secondary | ICD-10-CM | POA: Diagnosis not present

## 2020-11-17 LAB — MICROALBUMIN / CREATININE URINE RATIO
Creatinine,U: 82.8 mg/dL
Microalb Creat Ratio: 0.8 mg/g (ref 0.0–30.0)
Microalb, Ur: <0.7 mg/dL (ref 0.0–1.9)

## 2020-11-17 MED ORDER — LEVOCETIRIZINE DIHYDROCHLORIDE 5 MG PO TABS: 5.0000 mg | ORAL_TABLET | Freq: Every evening | ORAL | 3 refills | Status: DC

## 2020-11-17 MED ORDER — DEXLANSOPRAZOLE 60 MG PO CPDR: 1.0000 | DELAYED_RELEASE_CAPSULE | Freq: Every day | ORAL | 3 refills | Status: DC

## 2020-11-17 NOTE — Patient Instructions (Signed)
Preventive Care 61-61 Years Old, Female Preventive care refers to lifestyle choices and visits with your health care provider that can promote health and wellness. This includes:  A yearly physical exam. This is also called an annual wellness visit.  Regular dental and eye exams.  Immunizations.  Screening for certain conditions.  Healthy lifestyle choices, such as: ? Eating a healthy diet. ? Getting regular exercise. ? Not using drugs or products that contain nicotine and tobacco. ? Limiting alcohol use. What can I expect for my preventive care visit? Physical exam Your health care provider will check your:  Height and weight. These may be used to calculate your BMI (body mass index). BMI is a measurement that tells if you are at a healthy weight.  Heart rate and blood pressure.  Body temperature.  Skin for abnormal spots. Counseling Your health care provider may ask you questions about your:  Past medical problems.  Family's medical history.  Alcohol, tobacco, and drug use.  Emotional well-being.  Home life and relationship well-being.  Sexual activity.  Diet, exercise, and sleep habits.  Work and work Statistician.  Access to firearms.  Method of birth control.  Menstrual cycle.  Pregnancy history. What immunizations do I need? Vaccines are usually given at various ages, according to a schedule. Your health care provider will recommend vaccines for you based on your age, medical history, and lifestyle or other factors, such as travel or where you work.   What tests do I need? Blood tests  Lipid and cholesterol levels. These may be checked every 5 years, or more often if you are over 3 years old.  Hepatitis C test.  Hepatitis B test. Screening  Lung cancer screening. You may have this screening every year starting at age 73 if you have a 30-pack-year history of smoking and currently smoke or have quit within the past 15 years.  Colorectal cancer  screening. ? All adults should have this screening starting at age 52 and continuing until age 17. ? Your health care provider may recommend screening at age 49 if you are at increased risk. ? You will have tests every 1-10 years, depending on your results and the type of screening test.  Diabetes screening. ? This is done by checking your blood sugar (glucose) after you have not eaten for a while (fasting). ? You may have this done every 1-3 years.  Mammogram. ? This may be done every 1-2 years. ? Talk with your health care provider about when you should start having regular mammograms. This may depend on whether you have a family history of breast cancer.  BRCA-related cancer screening. This may be done if you have a family history of breast, ovarian, tubal, or peritoneal cancers.  Pelvic exam and Pap test. ? This may be done every 3 years starting at age 10. ? Starting at age 11, this may be done every 5 years if you have a Pap test in combination with an HPV test. Other tests  STD (sexually transmitted disease) testing, if you are at risk.  Bone density scan. This is done to screen for osteoporosis. You may have this scan if you are at high risk for osteoporosis. Talk with your health care provider about your test results, treatment options, and if necessary, the need for more tests. Follow these instructions at home: Eating and drinking  Eat a diet that includes fresh fruits and vegetables, whole grains, lean protein, and low-fat dairy products.  Take vitamin and mineral supplements  as recommended by your health care provider.  Do not drink alcohol if: ? Your health care provider tells you not to drink. ? You are pregnant, may be pregnant, or are planning to become pregnant.  If you drink alcohol: ? Limit how much you have to 0-1 drink a day. ? Be aware of how much alcohol is in your drink. In the [place] one drink equals one 12 oz bottle of beer (355 mL), one 5 oz glass of  wine (148 mL), or one 1 oz glass of hard liquor (44 mL).   Lifestyle  Take daily care of your teeth and gums. Brush your teeth every morning and night with fluoride toothpaste. Floss one time each day.  Stay active. Exercise for at least 30 minutes 5 or more days each week.  Do not use any products that contain nicotine or tobacco, such as cigarettes, e-cigarettes, and chewing tobacco. If you need help quitting, ask your health care provider.  Do not use drugs.  If you are sexually active, practice safe sex. Use a condom or other form of protection to prevent STIs (sexually transmitted infections).  If you do not wish to become pregnant, use a form of birth control. If you plan to become pregnant, see your health care provider for a prepregnancy visit.  If told by your health care provider, take low-dose aspirin daily starting at age 50.  Find healthy ways to cope with stress, such as: ? Meditation, yoga, or listening to music. ? Journaling. ? Talking to a trusted person. ? Spending time with friends and family. Safety  Always wear your seat belt while driving or riding in a vehicle.  Do not drive: ? If you have been drinking alcohol. Do not ride with someone who has been drinking. ? When you are tired or distracted. ? While texting.  Wear a helmet and other protective equipment during sports activities.  If you have firearms in your house, make sure you follow all gun safety procedures. What's next?  Visit your health care provider once a year for an annual wellness visit.  Ask your health care provider how often you should have your eyes and teeth checked.  Stay up to date on all vaccines. This information is not intended to replace advice given to you by your health care provider. Make sure you discuss any questions you have with your health care provider. Document Revised: 05/10/2020 Document Reviewed: 04/17/2018 Elsevier Patient Education  2021 [employer]  

## 2020-11-17 NOTE — Progress Notes (Signed)
++Subjective:     Emily Osborne is a 60 y.o. female and is here for a comprehensive physical exam. The patient reports problems - L shoulder pain--- she has seen Dr Marlou Sa and will call him .   He was going to arrange for an injection or ?PT.  No other complaints.   Pt is also f/u on labs -- glucose and bp.    Social History   Socioeconomic History  . Marital status: Divorced    Spouse name: Not on file  . Number of children: 2  . Years of education: Not on file  . Highest education level: Not on file  Occupational History  . Occupation: Nurse/teacher  Tobacco Use  . Smoking status: Never Smoker  . Smokeless tobacco: Never Used  Vaping Use  . Vaping Use: Never used  Substance and Sexual Activity  . Alcohol use: No    Alcohol/week: 0.0 standard drinks  . Drug use: No  . Sexual activity: Not Currently    Partners: Male  Other Topics Concern  . Not on file  Social History Narrative   Lives with alone.  She has two daughter.   She works as a Programme researcher, broadcasting/film/video at [employer]--- no   Right Handed   Social Determinants of Radio broadcast assistant Strain: Not on file  Food Insecurity: Not on file  Transportation Needs: Not on file  Physical Activity: Not on file  Stress: Not on file  Social Connections: Not on file  Intimate Partner Violence: Not on file   Health Maintenance  Topic Date Due  . HEMOGLOBIN A1C  02/28/2020  . FOOT EXAM  08/30/2020  . URINE MICROALBUMIN  08/30/2020  . MAMMOGRAM  12/15/2020  . PAP SMEAR-Modifier  08/07/2021  . OPHTHALMOLOGY EXAM  11/10/2021  . COLONOSCOPY (Pts 45-46yr Insurance coverage will need to be confirmed)  10/17/2023  . TETANUS/TDAP  07/02/2027  . PNEUMOCOCCAL POLYSACCHARIDE VACCINE AGE 67-64 HIGH RISK  Completed  . COVID-19 Vaccine  Completed  . Hepatitis C Screening  Completed  . HIV Screening  Completed  . HPV VACCINES  Aged Out    The following portions of the patient's history were reviewed and updated as  appropriate:  She  has a past medical history of Allergy, Anxiety, Asthma, Back pain, C. difficile colitis, Cataract, Chest pain, Chronic fatigue syndrome, Clostridium difficile colitis (12/13/2016), Diverticulitis, Dyspnea, Dysrhythmia, Edema, Elevated blood pressure reading without diagnosis of hypertension, Fatty liver, Fibromyalgia, Ganglion of joint, GERD (gastroesophageal reflux disease), H. pylori infection, Headache, Helicobacter pylori (H. pylori) infection (12/13/2016), Herpes zoster without mention of complication, HTN (hypertension), Hyperlipidemia, Hyperthyroidism, IBS (irritable bowel syndrome), Leg edema, Multiple drug allergies (06/18/2017), Myalgia, Nontraumatic rupture of Achilles tendon, Other acute reactions to stress, Pain in joint, shoulder region, Pain in joint, upper arm, Pain in limb, Palpitations, Personal history of other diseases of digestive system, Pneumonia, PONV (postoperative nausea and vomiting), Routine screening for STI (sexually transmitted infection) (12/13/2016), S/P partial colectomy (06/18/2017), Thyroid disease, Type 2 diabetes mellitus with hyperglycemia, without long-term current use of insulin ([place] (08/07/2018), and Vitamin B 12 deficiency. She does not have any pertinent problems on file. She  has a past surgical history that includes Abdominal hysterectomy; Tubal ligation; Bunionectomy (Bilateral); Achilles tendon repair (Right, 2005); Ganglion cyst excision (Right); Meniscus repair (Right, 04/2014); wisdomteeth extraction; LEFT HEART CATH AND CORONARY ANGIOGRAPHY (N/A, 10/03/2016); Colon surgery (01/23/2017); Colonoscopy; and Polypectomy. Her family history includes Cancer in her brother; Coronary artery disease in her brother;  Diabetes in her father; Heart disease in her father and mother; Hyperlipidemia in her father and mother; Hypertension in her brother, father, and mother; Kidney disease in her father; Leukemia in her brother; Lupus in her daughter; Obesity in  her mother; Pernicious anemia in her sister; Thyroid disease in her mother and sister. She  reports that she has never smoked. She has never used smokeless tobacco. She reports that she does not drink alcohol and does not use drugs. She has a current medication list which includes the following prescription(s): acetaminophen, albuterol, alprazolam, AMBULATORY NON FORMULARY MEDICATION, azelastine, freestyle freedom, budesonide-formoterol, diclofenac sodium, dicyclomine, famotidine, fluticasone, freestyle lite, i.l.x. b-12, lidocaine, valacyclovir, vitamin d (ergocalciferol), dexlansoprazole, and levocetirizine. Current Outpatient Medications on File Prior to Visit  Medication Sig Dispense Refill  . acetaminophen (TYLENOL) 500 MG tablet Take 1,000 mg by mouth every 6 (six) hours as needed for moderate pain.    Marland Kitchen albuterol (PROAIR HFA) 108 (90 Base) MCG/ACT inhaler USE 2 PUFFS EVERY 4 HOURS AS NEEDED FOR COUGH, WHEEZE OR SHORTNESS OF BREATH. 8.5 each 2  . ALPRAZolam (XANAX) 0.25 MG tablet TAKE 1 TABLET BY MOUTH THREE TIMES A DAY AS NEEDED 30 tablet 2  . AMBULATORY NON FORMULARY MEDICATION Medication Name: [facility]  Apply to affected area as needed 30 g 11  . azelastine (OPTIVAR) 0.05 % ophthalmic solution Place 1 drop into both eyes 2 (two) times daily as needed.    . Blood Glucose Monitoring Suppl (FREESTYLE FREEDOM) KIT 1 Device by Does not apply route daily. 1 each 0  . budesonide-formoterol (SYMBICORT) 80-4.5 MCG/ACT inhaler INHALE 2 PUFFS BY MOUTH FIRST THING IN THE MORNING AND THEN ANOTHER 2 PUFFS ABOUT 12 HOURS LATER 10.2 g 4  . diclofenac sodium (VOLTAREN) 1 % GEL Apply 2 g topically 4 (four) times daily. Rub into affected area of foot 2 to 4 times daily 100 g 2  . dicyclomine (BENTYL) 10 MG capsule Take 1 capsule (10 mg total) by mouth every 8 (eight) hours as needed for spasms. 90 capsule 2  . famotidine (PEPCID) 20 MG tablet One  after supper 30 tablet 11  . fluticasone (FLONASE) 50 MCG/ACT nasal spray PLACE 2 SPRAYS INTO BOTH NOSTRILS DAILY. 16 g 2  . glucose blood (FREESTYLE LITE) test strip USE TO CHECK BLOOD SUGAR ONCE A DAY 100 each 1  . Iron Combinations (I.L.X. B-12) ELIX Take 1 each by mouth daily as needed (energy). 1 droplet full    . lidocaine (LIDODERM) 5 % Apply to affected area QD PRN for a Week 30 patch 0  . valACYclovir (VALTREX) 1000 MG tablet Take 1,000 mg by mouth 3 (three) times daily as needed.    . Vitamin D, Ergocalciferol, (DRISDOL) 1.25 MG (50000 UNIT) CAPS capsule TAKE 1 CAPSULE (50,000 UNITS TOTAL) BY MOUTH EVERY 7 (SEVEN) DAYS. 12 capsule 1   No current facility-administered medications on file prior to visit.   She is allergic to aspirin, gentamicin, ibuprofen, metronidazole, nsaids, doxycycline, influenza a (h1n1) monoval vac, loratadine, periactin [cyproheptadine], ranitidine hcl, sulfamethoxazole-trimethoprim, and tramadol hcl..  Review of Systems Review of Systems  Constitutional: Negative for activity change, appetite change and fatigue.  HENT: Negative for hearing loss, congestion, tinnitus and ear discharge.  dentist q26mEyes: Negative for visual disturbance (see optho q1y -- vision corrected to 20/20 with glasses).  Respiratory: Negative for cough, chest tightness and shortness of breath.   Cardiovascular: Negative for chest pain, palpitations and leg swelling.  Gastrointestinal: Negative for abdominal pain, diarrhea, constipation and abdominal distention.  Genitourinary: Negative for urgency, frequency, decreased urine volume and difficulty urinating.  Musculoskeletal: Negative for back pain,  and gait problem.  + L arm pain Skin: Negative for color change, pallor and rash.  Neurological: Negative for dizziness, light-headedness, numbness and headaches.  Hematological: Negative for adenopathy. Does not bruise/bleed easily.  Psychiatric/Behavioral: Negative for suicidal ideas,  confusion, sleep disturbance, self-injury, dysphoric mood, decreased concentration and agitation.       Objective:    BP 110/78 (BP Location: Right Arm, Patient Position: Sitting, Cuff Size: Large)   Pulse 74   Temp 98.8 F (37.1 C) (Oral)   Wt 243 lb (110.2 kg)   SpO2 96%   BMI 40.44 kg/m  General appearance: alert, cooperative, appears stated age and no distress Head: Normocephalic, without obvious abnormality, atraumatic Eyes: negative findings: lids and lashes normal, conjunctivae and sclerae normal and pupils equal, round, reactive to light and accomodation Ears: normal TM's and external ear canals both ears Neck: no adenopathy, no carotid bruit, no JVD, supple, symmetrical, trachea midline and thyroid not enlarged, symmetric, no tenderness/mass/nodules Back: symmetric, no curvature. ROM normal. No CVA tenderness. Lungs: clear to auscultation bilaterally Heart: regular rate and rhythm, S1, S2 normal, no murmur, click, rub or gallop Abdomen: soft, non-tender; bowel sounds normal; no masses,  no organomegaly Pelvic: deferred Extremities: extremities normal, atraumatic, no cyanosis or edema Pulses: 2+ and symmetric Skin: Skin color, texture, turgor normal. No rashes or lesions Lymph nodes: Cervical, supraclavicular, and axillary nodes normal. Neurologic: Alert and oriented X 3, normal strength and tone. Normal symmetric reflexes. Normal coordination and gait    Assessment:    Healthy female exam.      Plan:    ghm utd Check labs  See After Visit Summary for Counseling Recommendations    1. Preventative health care See above  - Lipid panel - CBC with Differential/Platelet - TSH - Comprehensive metabolic panel - Microalbumin / creatinine urine ratio  2. Primary hypertension Well controlled, no changes to meds. Encouraged heart healthy diet such as the DASH diet and exercise as tolerated.  - Lipid panel - CBC with Differential/Platelet - TSH - Comprehensive  metabolic panel - Microalbumin / creatinine urine ratio  3. Gastroesophageal reflux disease stable - dexlansoprazole (DEXILANT) 60 MG capsule; Take 1 capsule (60 mg total) by mouth daily.  Dispense: 90 capsule; Refill: 3  4. Seasonal allergies Stable; - levocetirizine (XYZAL) 5 MG tablet; Take 1 tablet (5 mg total) by mouth every evening.  Dispense: 90 tablet; Refill: 3  5. Vitamin D deficiency   - Vitamin D (25 hydroxy)  6. Diet-controlled diabetes mellitus ([place]) Check labs  hgba1c to be checked , minimize simple carbs. Increase exercise as tolerated. Continue current meds  - Hemoglobin A1c

## 2020-11-18 LAB — COMPREHENSIVE METABOLIC PANEL
ALT: 32 U/L (ref 0–35)
AST: 26 U/L (ref 0–37)
Albumin: 4.2 g/dL (ref 3.5–5.2)
Alkaline Phosphatase: 70 U/L (ref 39–117)
BUN: 14 mg/dL (ref 6–23)
CO2: 30 mEq/L (ref 19–32)
Calcium: 9.7 mg/dL (ref 8.4–10.5)
Chloride: 105 mEq/L (ref 96–112)
Creatinine, Ser: 0.89 mg/dL (ref 0.40–1.20)
GFR: 70.26 mL/min (ref >60.00)
Glucose, Bld: 84 mg/dL (ref 70–99)
Potassium: 4.5 mEq/L (ref 3.5–5.1)
Sodium: 142 mEq/L (ref 135–145)
Total Bilirubin: 0.4 mg/dL (ref 0.2–1.2)
Total Protein: 7 g/dL (ref 6.0–8.3)

## 2020-11-18 LAB — LIPID PANEL
Cholesterol: 212 mg/dL — ABNORMAL HIGH (ref 0–200)
HDL: 54.7 mg/dL (ref >39.00)
LDL Cholesterol: 127 mg/dL — ABNORMAL HIGH (ref 0–99)
NonHDL: 157.5
Total CHOL/HDL Ratio: 4
Triglycerides: 151 mg/dL — ABNORMAL HIGH (ref 0.0–149.0)
VLDL: 30.2 mg/dL (ref 0.0–40.0)

## 2020-11-18 LAB — CBC WITH DIFFERENTIAL/PLATELET
Basophils Absolute: 0.1 10*3/uL (ref 0.0–0.1)
Basophils Relative: 1.1 % (ref 0.0–3.0)
Eosinophils Absolute: 0.1 10*3/uL (ref 0.0–0.7)
Eosinophils Relative: 2 % (ref 0.0–5.0)
HCT: 41 % (ref 36.0–46.0)
Hemoglobin: 13.6 g/dL (ref 12.0–15.0)
Lymphocytes Relative: 45.4 % (ref 12.0–46.0)
Lymphs Abs: 2.4 10*3/uL (ref 0.7–4.0)
MCHC: 33.3 g/dL (ref 30.0–36.0)
MCV: 88.1 fl (ref 78.0–100.0)
Monocytes Absolute: 0.6 10*3/uL (ref 0.1–1.0)
Monocytes Relative: 11.4 % (ref 3.0–12.0)
Neutro Abs: 2.2 10*3/uL (ref 1.4–7.7)
Neutrophils Relative %: 40.1 % — ABNORMAL LOW (ref 43.0–77.0)
Platelets: 237 10*3/uL (ref 150.0–400.0)
RBC: 4.65 Mil/uL (ref 3.87–5.11)
RDW: 15 % (ref 11.5–15.5)
WBC: 5.4 10*3/uL (ref 4.0–10.5)

## 2020-11-18 LAB — VITAMIN D 25 HYDROXY (VIT D DEFICIENCY, FRACTURES): VITD: 32.1 ng/mL (ref 30.00–100.00)

## 2020-11-18 LAB — TSH: TSH: 1.43 u[IU]/mL (ref 0.35–4.50)

## 2020-11-18 LAB — HEMOGLOBIN A1C: Hgb A1c MFr Bld: 5.9 % (ref 4.6–6.5)

## 2020-11-19 ENCOUNTER — Other Ambulatory Visit (HOSPITAL_COMMUNITY): Payer: Self-pay

## 2020-11-19 MED FILL — Levocetirizine Dihydrochloride Tab 5 MG: ORAL | 90 days supply | Qty: 90 | Fill #0 | Status: AC

## 2020-11-21 ENCOUNTER — Other Ambulatory Visit: Payer: Self-pay | Admitting: Family Medicine

## 2020-11-21 DIAGNOSIS — E785 Hyperlipidemia, unspecified: Secondary | ICD-10-CM

## 2020-11-23 ENCOUNTER — Other Ambulatory Visit (HOSPITAL_COMMUNITY): Payer: Self-pay

## 2020-11-23 ENCOUNTER — Other Ambulatory Visit: Payer: Self-pay

## 2020-11-23 MED ORDER — ROSUVASTATIN CALCIUM 10 MG PO TABS
10.0000 mg | ORAL_TABLET | Freq: Every day | ORAL | 2 refills | Status: DC
  Filled 2020-11-23: qty 30, 30d supply, fill #0

## 2020-11-23 MED FILL — Fluticasone Propionate Nasal Susp 50 MCG/ACT: NASAL | 30 days supply | Qty: 16 | Fill #0 | Status: AC

## 2020-11-23 MED FILL — Dexlansoprazole Cap Delayed Release 60 MG: ORAL | 90 days supply | Qty: 90 | Fill #0 | Status: CN

## 2020-12-23 ENCOUNTER — Other Ambulatory Visit (HOSPITAL_BASED_OUTPATIENT_CLINIC_OR_DEPARTMENT_OTHER): Payer: Self-pay

## 2020-12-23 ENCOUNTER — Other Ambulatory Visit: Payer: Self-pay | Admitting: Family Medicine

## 2020-12-23 DIAGNOSIS — E559 Vitamin D deficiency, unspecified: Secondary | ICD-10-CM

## 2020-12-27 ENCOUNTER — Other Ambulatory Visit (HOSPITAL_BASED_OUTPATIENT_CLINIC_OR_DEPARTMENT_OTHER): Payer: Self-pay

## 2021-01-26 ENCOUNTER — Other Ambulatory Visit: Payer: Self-pay | Admitting: Family Medicine

## 2021-01-26 DIAGNOSIS — Z1231 Encounter for screening mammogram for malignant neoplasm of breast: Secondary | ICD-10-CM

## 2021-01-27 ENCOUNTER — Other Ambulatory Visit: Payer: Self-pay

## 2021-01-27 ENCOUNTER — Ambulatory Visit
Admission: RE | Admit: 2021-01-27 | Discharge: 2021-01-27 | Disposition: A | Discharge status: Home / Self Care | Payer: 59 | Source: Ambulatory Visit | Attending: Family Medicine | Admitting: Family Medicine

## 2021-01-27 DIAGNOSIS — Z1231 Encounter for screening mammogram for malignant neoplasm of breast: Secondary | ICD-10-CM

## 2021-01-31 ENCOUNTER — Other Ambulatory Visit: Payer: Self-pay | Admitting: Family Medicine

## 2021-01-31 DIAGNOSIS — R928 Other abnormal and inconclusive findings on diagnostic imaging of breast: Secondary | ICD-10-CM

## 2021-02-02 ENCOUNTER — Encounter: Payer: Self-pay | Admitting: Family Medicine

## 2021-02-02 ENCOUNTER — Other Ambulatory Visit (HOSPITAL_COMMUNITY): Payer: Self-pay

## 2021-02-02 ENCOUNTER — Other Ambulatory Visit: Payer: Self-pay

## 2021-02-02 ENCOUNTER — Ambulatory Visit: Payer: 59 | Admitting: Family Medicine

## 2021-02-02 ENCOUNTER — Other Ambulatory Visit (HOSPITAL_BASED_OUTPATIENT_CLINIC_OR_DEPARTMENT_OTHER): Payer: Self-pay

## 2021-02-02 VITALS — BP 110/70 | HR 75 | Temp 98.6°F | Resp 18 | Ht 65.0 in | Wt 242.8 lb

## 2021-02-02 DIAGNOSIS — R1030 Lower abdominal pain, unspecified: Secondary | ICD-10-CM | POA: Diagnosis not present

## 2021-02-02 DIAGNOSIS — R1031 Right lower quadrant pain: Secondary | ICD-10-CM

## 2021-02-02 DIAGNOSIS — I10 Essential (primary) hypertension: Secondary | ICD-10-CM | POA: Diagnosis not present

## 2021-02-02 DIAGNOSIS — R7303 Prediabetes: Secondary | ICD-10-CM | POA: Diagnosis not present

## 2021-02-02 DIAGNOSIS — E785 Hyperlipidemia, unspecified: Secondary | ICD-10-CM

## 2021-02-02 DIAGNOSIS — R609 Edema, unspecified: Secondary | ICD-10-CM

## 2021-02-02 LAB — COMPREHENSIVE METABOLIC PANEL
ALT: 33 U/L (ref 0–35)
AST: 26 U/L (ref 0–37)
Albumin: 4.2 g/dL (ref 3.5–5.2)
Alkaline Phosphatase: 69 U/L (ref 39–117)
BUN: 16 mg/dL (ref 6–23)
CO2: 30 mEq/L (ref 19–32)
Calcium: 9.3 mg/dL (ref 8.4–10.5)
Chloride: 106 mEq/L (ref 96–112)
Creatinine, Ser: 0.93 mg/dL (ref 0.40–1.20)
GFR: 66.55 mL/min (ref >60.00)
Glucose, Bld: 89 mg/dL (ref 70–99)
Potassium: 4 mEq/L (ref 3.5–5.1)
Sodium: 141 mEq/L (ref 135–145)
Total Bilirubin: 0.4 mg/dL (ref 0.2–1.2)
Total Protein: 7.2 g/dL (ref 6.0–8.3)

## 2021-02-02 LAB — POC URINALSYSI DIPSTICK (AUTOMATED)
Bilirubin, UA: NEGATIVE
Blood, UA: NEGATIVE
Glucose, UA: NEGATIVE
Ketones, UA: NEGATIVE
Leukocytes, UA: NEGATIVE
Nitrite, UA: NEGATIVE
Protein, UA: NEGATIVE
Spec Grav, UA: 1.025 (ref 1.010–1.025)
Urobilinogen, UA: 0.2 E.U./dL
pH, UA: 5.5 (ref 5.0–8.0)

## 2021-02-02 LAB — CBC WITH DIFFERENTIAL/PLATELET
Basophils Absolute: 0 10*3/uL (ref 0.0–0.1)
Basophils Relative: 0.7 % (ref 0.0–3.0)
Eosinophils Absolute: 0.1 10*3/uL (ref 0.0–0.7)
Eosinophils Relative: 2.6 % (ref 0.0–5.0)
HCT: 40.1 % (ref 36.0–46.0)
Hemoglobin: 13.2 g/dL (ref 12.0–15.0)
Lymphocytes Relative: 54 % — ABNORMAL HIGH (ref 12.0–46.0)
Lymphs Abs: 2.5 10*3/uL (ref 0.7–4.0)
MCHC: 32.8 g/dL (ref 30.0–36.0)
MCV: 88.4 fl (ref 78.0–100.0)
Monocytes Absolute: 0.5 10*3/uL (ref 0.1–1.0)
Monocytes Relative: 10.1 % (ref 3.0–12.0)
Neutro Abs: 1.5 10*3/uL (ref 1.4–7.7)
Neutrophils Relative %: 32.6 % — ABNORMAL LOW (ref 43.0–77.0)
Platelets: 240 10*3/uL (ref 150.0–400.0)
RBC: 4.54 Mil/uL (ref 3.87–5.11)
RDW: 14.6 % (ref 11.5–15.5)
WBC: 4.6 10*3/uL (ref 4.0–10.5)

## 2021-02-02 LAB — TSH: TSH: 0.79 u[IU]/mL (ref 0.35–4.50)

## 2021-02-02 LAB — LIPID PANEL
Cholesterol: 179 mg/dL (ref 0–200)
HDL: 52.4 mg/dL (ref >39.00)
LDL Cholesterol: 101 mg/dL — ABNORMAL HIGH (ref 0–99)
NonHDL: 126.7
Total CHOL/HDL Ratio: 3
Triglycerides: 128 mg/dL (ref 0.0–149.0)
VLDL: 25.6 mg/dL (ref 0.0–40.0)

## 2021-02-02 LAB — HEMOGLOBIN A1C: Hgb A1c MFr Bld: 6 % (ref 4.6–6.5)

## 2021-02-02 MED ORDER — HYDROCHLOROTHIAZIDE 25 MG PO TABS
25.0000 mg | ORAL_TABLET | Freq: Every day | ORAL | 3 refills | Status: DC
  Filled 2021-02-02 (×2): qty 90, 90d supply, fill #0
  Filled 2021-05-20: qty 90, 90d supply, fill #1
  Filled 2021-08-21: qty 90, 90d supply, fill #2
  Filled 2022-01-03: qty 90, 90d supply, fill #3

## 2021-02-02 NOTE — Progress Notes (Signed)
Subjective:   By signing my name below, I, [name], attest that this documentation has been prepared under the direction and in the presence of Dr. [name], DO. 02/02/2021    Patient ID: Emily Osborne, female    DOB: 13-Apr-1961, 60 y.o.   MRN: [id number]  Chief Complaint  Patient presents with   Leg Swelling    Right leg, Pt states swelling occurs at night at work and states feeling tightness during the day. Pt states occ pain.     HPI Patient is in today for a office visit. She complains of retaining fluid in her right leg. She noticed it worsens while working. She does not feel pain on her left leg. She has had a procedure done on that leg in the past. She wore compression socks but stopped wearing them because it was too expensive. She tried compression socks from [facility] but stopped wearing them because they were too tight and caused calf pain.  She also complains of a fish smell after urinating and bowel movements. She does not usually eat fish. She does not have vaginal discharge. She was prescribed an antibiotic to manage her symptoms from her GI provider. She was also told to not eat fish. She has also developed occasional lower right abdominal pain since last Sunday. She finds relief after pressing on her lower right abdomen. Her pain feels like her diverticulitis pain but is different because it is not constant. She does not remember if she had her appendix removed during her past abdominal procedures.  She also reports unexpected weight gain. She is managing her diet but does not participate in exercise. She also reports that while working during the weekend she had increased appetite and and thirst even while eating and drinking normally. Her blood sugar measured 77 the next day.   Past Medical History:  Diagnosis Date   Allergy    Anxiety    Asthma    pt has inhaler   Back pain    C. difficile colitis    Cataract    bil eyes   Chest pain    Chronic  fatigue syndrome    Clostridium difficile colitis 12/13/2016   Diverticulitis    Dyspnea    Dysrhythmia    palpitations   Edema    Elevated blood pressure reading without diagnosis of hypertension    "just elevated when I'm in pain" (12/07/2015)   Fatty liver    Fibromyalgia    Ganglion of joint    right wrist   GERD (gastroesophageal reflux disease)    H. pylori infection    Headache    hx of   Helicobacter pylori (H. pylori) infection 12/13/2016   Herpes zoster without mention of complication    HTN (hypertension)    Hyperlipidemia    Hyperthyroidism    IBS (irritable bowel syndrome)    Leg edema    Multiple drug allergies 06/18/2017   Myalgia    Nontraumatic rupture of Achilles tendon    Other acute reactions to stress    Pain in joint, shoulder region    Pain in joint, upper arm    Pain in limb    Palpitations    Personal history of other diseases of digestive system    Pneumonia    once   PONV (postoperative nausea and vomiting)    Routine screening for STI (sexually transmitted infection) 12/13/2016   S/P partial colectomy 06/18/2017   Thyroid disease    hyperthyroidism  Type 2 diabetes mellitus with hyperglycemia, without long-term current use of insulin ([facility]) 08/07/2018   Vitamin B 12 deficiency     Past Surgical History:  Procedure Laterality Date   ABDOMINAL HYSTERECTOMY     ACHILLES TENDON REPAIR Right 2005   BUNIONECTOMY Bilateral    Bunionectomy 1983   COLON SURGERY  01/23/2017   6 to 8 inches sigmoid colon removed   COLONOSCOPY     GANGLION CYST EXCISION Right    wrist   LEFT HEART CATH AND CORONARY ANGIOGRAPHY N/A 10/03/2016   Procedure: Left Heart Cath and Coronary Angiography;  Surgeon: Burnell Blanks, MD;  Location: [facility];  Service: Cardiovascular;  Laterality: N/A;   MENISCUS REPAIR Right 04/2014   POLYPECTOMY     TUBAL LIGATION     wisdomteeth extraction      Family History  Problem Relation Age of Onset    Hypertension Father    Diabetes Father    Heart disease Father    Kidney disease Father        Died, 34   Hyperlipidemia Father    Leukemia Brother    Cancer Brother        myleoblastic anemia   Hyperlipidemia Mother    Hypertension Mother        Died, 36   Thyroid disease Mother        Thyroid surgery   Obesity Mother    Heart disease Mother    Pernicious anemia Sister    Thyroid disease Sister        On thyroid Rx   Lupus Daughter    Coronary artery disease Brother    Hypertension Brother    Colon cancer Neg Hx    Esophageal cancer Neg Hx    Rectal cancer Neg Hx    Stomach cancer Neg Hx    Pancreatic cancer Neg Hx    Prostate cancer Neg Hx    Colon polyps Neg Hx     Social History   Socioeconomic History   Marital status: Divorced    Spouse name: Not on file   Number of children: 2   Years of education: Not on file   Highest education level: Not on file  Occupational History   Occupation: Personnel officer  Tobacco Use   Smoking status: Never   Smokeless tobacco: Never  Vaping Use   Vaping Use: Never used  Substance and Sexual Activity   Alcohol use: No    Alcohol/week: 0.0 standard drinks   Drug use: No   Sexual activity: Not Currently    Partners: Male  Other Topics Concern   Not on file  Social History Narrative   Lives with alone.  She has two daughter.   She works as a Programme researcher, broadcasting/film/video at [employer]--- no   Right Handed   Social Determinants of Radio broadcast assistant Strain: Not on file  Food Insecurity: Not on file  Transportation Needs: Not on file  Physical Activity: Not on file  Stress: Not on file  Social Connections: Not on file  Intimate Partner Violence: Not on file    Outpatient Medications Prior to Visit  Medication Sig Dispense Refill   acetaminophen (TYLENOL) 500 MG tablet Take 1,000 mg by mouth every 6 (six) hours as needed for moderate pain.     albuterol (PROAIR HFA) 108 (90 Base) MCG/ACT inhaler USE 2 PUFFS EVERY  4 HOURS AS NEEDED FOR COUGH, WHEEZE OR SHORTNESS OF BREATH. 8.5 each 2  ALPRAZolam (XANAX) 0.25 MG tablet TAKE 1 TABLET BY MOUTH THREE TIMES A DAY AS NEEDED 30 tablet 2   AMBULATORY NON FORMULARY MEDICATION Medication Name: C[facility]-A[facility] Dentist  Apply to affected area as needed 30 g 11   azelastine (OPTIVAR) 0.05 % ophthalmic solution Place 1 drop into both eyes 2 (two) times daily as needed.     Blood Glucose Monitoring Suppl (FREESTYLE FREEDOM) KIT 1 Device by Does not apply route daily. 1 each 0   budesonide-formoterol (SYMBICORT) 80-4.5 MCG/ACT inhaler INHALE 2 PUFFS BY MOUTH FIRST THING IN THE MORNING AND THEN ANOTHER 2 PUFFS ABOUT 12 HOURS LATER 10.2 g 4   COVID-19 mRNA vaccine, Pfizer, 30 MCG/0.3ML injection INJECT AS DIRECTED .3 mL 0   dexlansoprazole (DEXILANT) 60 MG capsule TAKE 1 CAPSULE (60 MG TOTAL) BY MOUTH DAILY. 90 capsule 3   diclofenac sodium (VOLTAREN) 1 % GEL Apply 2 g topically 4 (four) times daily. Rub into affected area of foot 2 to 4 times daily 100 g 2   famotidine (PEPCID) 20 MG tablet One after supper 30 tablet 11   fluticasone (FLONASE) 50 MCG/ACT nasal spray PLACE 2 SPRAYS INTO BOTH NOSTRILS DAILY. 16 g 1   glucose blood (FREESTYLE LITE) test strip USE TO CHECK BLOOD SUGAR ONCE A DAY 100 each 1   Iron Combinations (I.L.X. B-12) ELIX Take 1 each by mouth daily as needed (energy). 1 droplet full     levocetirizine (XYZAL) 5 MG tablet TAKE 1 TABLET BY MOUTH EVERY EVENING. 90 tablet 3   lidocaine (LIDODERM) 5 % Apply to affected area QD PRN for a Week 30 patch 0   Loteprednol Etabonate 0.38 % GEL PLACE 1 DROP INTO BOTH EYES TWICE A DAY *SHAKE WELL* 5 g 0   rosuvastatin (CRESTOR) 10 MG tablet Take 1 tablet (10 mg total) by mouth daily. 30 tablet 2   valACYclovir (VALTREX) 1000 MG tablet Take 1,000 mg by mouth 3 (three) times daily as needed.     Vitamin D, Ergocalciferol, (DRISDOL) 1.25 MG (50000 UNIT) CAPS capsule TAKE  1 CAPSULE (50,000 UNITS TOTAL) BY MOUTH EVERY 7 (SEVEN) DAYS. 12 capsule 1   dicyclomine (BENTYL) 10 MG capsule Take 1 capsule (10 mg total) by mouth every 8 (eight) hours as needed for spasms. (Patient not taking: Reported on 02/02/2021) 90 capsule 2   predniSONE (DELTASONE) 20 MG tablet TAKE 1 TABLET (20 MG TOTAL) BY MOUTH DAILY WITH BREAKFAST FOR 5 DAYS. (Patient not taking: Reported on 02/02/2021) 5 tablet 0   No facility-administered medications prior to visit.    Allergies  Allergen Reactions   Aspirin Other (See Comments)    REACTION: anaphylaxis   Gentamicin Other (See Comments)    Eye drops turned the sclera bright red   Ibuprofen Other (See Comments)    REACTION: anaphylaxsis   Metronidazole Swelling    REACTION: red face/swelling   Nsaids Other (See Comments)    REACTION: anaphylaxis   Doxycycline Other (See Comments)    REACTION: severe nausea/vomiting   Influenza A (H1n1) Monoval Vac Other (See Comments)    REACTION: sick for 3 weeks   Loratadine Other (See Comments)    Fatigue/weakness   Periactin [Cyproheptadine] Other (See Comments)    paranoid    Ranitidine Hcl Other (See Comments)    REACTION: Lips turned red /peel   Sulfamethoxazole-Trimethoprim Other (See Comments)    REACTION: face red/peel   Tramadol Hcl Other (See Comments)    REACTION:  paranoid    Review of Systems  Constitutional:  Negative for fever and malaise/fatigue.       (+)Weight gain  HENT:  Negative for congestion.   Eyes:  Negative for blurred vision.  Respiratory:  Negative for shortness of breath.   Cardiovascular:  Positive for leg swelling (Right leg). Negative for chest pain and palpitations.  Gastrointestinal:  Positive for abdominal pain (Lower left abdominal pain). Negative for blood in stool and nausea.  Genitourinary:  Negative for dysuria and frequency.  Musculoskeletal:  Negative for falls.  Skin:  Negative for rash.  Neurological:  Negative for dizziness, loss of  consciousness and headaches.  Endo/Heme/Allergies:  Negative for environmental allergies.  Psychiatric/Behavioral:  Negative for depression. The patient is not nervous/anxious.       Objective:    Physical Exam Vitals and nursing note reviewed.  Constitutional:      General: She is not in acute distress.    Appearance: Normal appearance. She is not ill-appearing.  HENT:     Head: Normocephalic and atraumatic.     Right Ear: External ear normal.     Left Ear: External ear normal.  Eyes:     Extraocular Movements: Extraocular movements intact.     Pupils: Pupils are equal, round, and reactive to light.  Cardiovascular:     Rate and Rhythm: Normal rate and regular rhythm.     Pulses: Normal pulses.     Heart sounds: Normal heart sounds. No murmur heard.   No gallop.  Pulmonary:     Effort: Pulmonary effort is normal. No respiratory distress.     Breath sounds: Normal breath sounds. No wheezing, rhonchi or rales.  Abdominal:     General: There is no distension.     Palpations: Abdomen is soft.     Tenderness: There is abdominal tenderness in the right lower quadrant. There is no guarding or rebound.  Skin:    General: Skin is warm and dry.  Neurological:     Mental Status: She is alert and oriented to person, place, and time.  Psychiatric:        Behavior: Behavior normal.    BP 110/70 (BP Location: Right Arm, Patient Position: Sitting, Cuff Size: Large)   Pulse 75   Temp 98.6 F (37 C) (Oral)   Resp 18   Ht _0  (1.651 m)   Wt 242 lb 12.8 oz (110.1 kg)   SpO2 97%   BMI 40.40 kg/m  Wt Readings from Last 3 Encounters:  02/02/21 242 lb 12.8 oz (110.1 kg)  11/17/20 243 lb (110.2 kg)  09/08/20 240 lb (108.9 kg)    Diabetic Foot Exam - Simple   No data filed    Lab Results  Component Value Date   WBC 5.4 11/17/2020   HGB 13.6 11/17/2020   HCT 41.0 11/17/2020   PLT 237.0 11/17/2020   GLUCOSE 84 11/17/2020   CHOL 212 (H) 11/17/2020   TRIG 151.0 (H) 11/17/2020    HDL 54.70 11/17/2020   LDLDIRECT 139.4 12/26/2011   LDLCALC 127 (H) 11/17/2020   ALT 32 11/17/2020   AST 26 11/17/2020   NA 142 11/17/2020   K 4.5 11/17/2020   CL 105 11/17/2020   CREATININE 0.89 11/17/2020   BUN 14 11/17/2020   CO2 30 11/17/2020   TSH 1.43 11/17/2020   INR 1.0 06/03/2020   HGBA1C 5.9 11/17/2020   MICROALBUR <0.7 11/17/2020    Lab Results  Component Value Date   TSH 1.43 11/17/2020  Lab Results  Component Value Date   WBC 5.4 11/17/2020   HGB 13.6 11/17/2020   HCT 41.0 11/17/2020   MCV 88.1 11/17/2020   PLT 237.0 11/17/2020   Lab Results  Component Value Date   NA 142 11/17/2020   K 4.5 11/17/2020   CO2 30 11/17/2020   GLUCOSE 84 11/17/2020   BUN 14 11/17/2020   CREATININE 0.89 11/17/2020   BILITOT 0.4 11/17/2020   ALKPHOS 70 11/17/2020   AST 26 11/17/2020   ALT 32 11/17/2020   PROT 7.0 11/17/2020   ALBUMIN 4.2 11/17/2020   CALCIUM 9.7 11/17/2020   ANIONGAP 10 06/03/2020   GFR 70.26 11/17/2020   Lab Results  Component Value Date   CHOL 212 (H) 11/17/2020   Lab Results  Component Value Date   HDL 54.70 11/17/2020   Lab Results  Component Value Date   LDLCALC 127 (H) 11/17/2020   Lab Results  Component Value Date   TRIG 151.0 (H) 11/17/2020   Lab Results  Component Value Date   CHOLHDL 4 11/17/2020   Lab Results  Component Value Date   HGBA1C 5.9 11/17/2020       Assessment & Plan:   Problem List Items Addressed This Visit       Unprioritized   Essential hypertension    Well controlled, no changes to meds. Encouraged heart healthy diet such as the DASH diet and exercise as tolerated.         Relevant Medications   hydrochlorothiazide (HYDRODIURIL) 25 MG tablet   Hyperlipidemia    Encourage heart healthy diet such as MIND or DASH diet, increase exercise, avoid trans fats, simple carbohydrates and processed foods, consider a krill or fish or flaxseed oil cap daily.        Relevant Medications    hydrochlorothiazide (HYDRODIURIL) 25 MG tablet   Other Relevant Orders   Lipid panel   Comprehensive metabolic panel   Lower abdominal pain    RLQ pain comes and goes  But is severe when it comes        Relevant Orders   CT Abdomen Pelvis W Contrast   CBC with Differential/Platelet   Prediabetes - Primary    Check labs         Relevant Orders   Hemoglobin A1c   Comprehensive metabolic panel   Insulin, random   Right lower quadrant abdominal pain   Relevant Orders   CT Abdomen Pelvis W Contrast   Swelling    Elevate low ext hctz daily Compression socks        Relevant Medications   hydrochlorothiazide (HYDRODIURIL) 25 MG tablet   Other Relevant Orders   TSH   Comprehensive metabolic panel   Other Visit Diagnoses     Primary hypertension       Relevant Medications   hydrochlorothiazide (HYDRODIURIL) 25 MG tablet        Meds ordered this encounter  Medications   hydrochlorothiazide (HYDRODIURIL) 25 MG tablet    Sig: Take 1 tablet (25 mg total) by mouth daily.    Dispense:  90 tablet    Refill:  3    I, Dr. [name], DO, personally preformed the services described in this documentation.  All medical record entries made by the scribe were at my direction and in my presence.  I have reviewed the chart and discharge instructions (if applicable) and agree that the record reflects my personal performance and is accurate and complete. 02/02/2021   I,[name],acting as a  scribe for [name], DO.,have documented all relevant documentation on the behalf of [name], DO,as directed by  [name], DO while in the presence of [name], DO.    [name], DO

## 2021-02-02 NOTE — Assessment & Plan Note (Signed)
Check labs 

## 2021-02-02 NOTE — Patient Instructions (Signed)

## 2021-02-02 NOTE — Assessment & Plan Note (Signed)
Encourage heart healthy diet such as MIND or DASH diet, increase exercise, avoid trans fats, simple carbohydrates and processed foods, consider a krill or fish or flaxseed oil cap daily.  °

## 2021-02-02 NOTE — Assessment & Plan Note (Signed)
Well controlled, no changes to meds. Encouraged heart healthy diet such as the DASH diet and exercise as tolerated.  °

## 2021-02-02 NOTE — Addendum Note (Signed)
Addended by: Sanda Linger on: 02/02/2021 01:37 PM   Modules accepted: Orders

## 2021-02-02 NOTE — Assessment & Plan Note (Signed)
Elevate low ext hctz daily Compression socks

## 2021-02-02 NOTE — Assessment & Plan Note (Signed)
RLQ pain comes and goes  But is severe when it comes

## 2021-02-03 ENCOUNTER — Ambulatory Visit: Payer: Self-pay

## 2021-02-03 ENCOUNTER — Ambulatory Visit: Payer: 59 | Admitting: Orthopedic Surgery

## 2021-02-03 ENCOUNTER — Other Ambulatory Visit: Payer: Self-pay | Admitting: Family Medicine

## 2021-02-03 ENCOUNTER — Ambulatory Visit
Admission: RE | Admit: 2021-02-03 | Discharge: 2021-02-03 | Disposition: A | Discharge status: Home / Self Care | Payer: 59 | Source: Ambulatory Visit | Attending: Family Medicine | Admitting: Family Medicine

## 2021-02-03 DIAGNOSIS — R921 Mammographic calcification found on diagnostic imaging of breast: Secondary | ICD-10-CM | POA: Diagnosis not present

## 2021-02-03 DIAGNOSIS — M7502 Adhesive capsulitis of left shoulder: Secondary | ICD-10-CM

## 2021-02-03 DIAGNOSIS — R928 Other abnormal and inconclusive findings on diagnostic imaging of breast: Secondary | ICD-10-CM

## 2021-02-03 DIAGNOSIS — M25512 Pain in left shoulder: Secondary | ICD-10-CM

## 2021-02-03 LAB — INSULIN, RANDOM: Insulin: 20.6 u[IU]/mL — ABNORMAL HIGH

## 2021-02-03 NOTE — Progress Notes (Signed)
Subjective: Patient is here for ultrasound-guided intra-articular left glenohumeral injection.    Objective:  Pain when reaching overhead and behind the back.  Procedure: Ultrasound guided injection is preferred based studies that show increased duration, increased effect, greater accuracy, decreased procedural pain, increased response rate, and decreased cost with ultrasound guided versus blind injection.   Verbal informed consent obtained.  Time-out conducted.  Noted no overlying erythema, induration, or other signs of local infection. Ultrasound-guided left glenohumeral injection: After sterile prep with Betadine, injected 4 cc 0.25% bupivocaine without epinephrine and 6 mg betamethasone using a 22-gauge spinal needle, passing the needle from posterior approach into the glenohumeral joint.  Injectate seen filling joint capsule.

## 2021-02-04 ENCOUNTER — Encounter: Payer: Self-pay | Admitting: Orthopedic Surgery

## 2021-02-04 NOTE — Progress Notes (Signed)
Office Visit Note   Patient: Emily Osborne           Date of Birth: 10-21-1960           MRN: [id number] Visit Date: 02/03/2021 Requested by: [address] [place], [place] [address] [place],  [place] [address] PCP: Carollee Herter, Alferd Apa, DO  Subjective: Chief Complaint  Patient presents with   Left Shoulder - Pain    HPI: Emily Osborne is a 60 y.o. female who presents to the office complaining of left shoulder pain.  Patient has history of previous examination with diagnosis of left shoulder adhesive capsulitis in November 2021.  She was set up to have a left shoulder injection at that time but never ended up having it as she was told no one was able to do the injection.  She never came back as she was frustrated but now she has returned since her pain is ongoing.  She never ended up having the injection.  Her pain is slightly improved compared with November but she feels continued pain with certain motions and has loss of range of motion.  She wakes with pain if she lays on her left side.  She works as a Marine scientist.  She takes Tylenol arthritis which helps especially if she takes it before bed.  Most of her pain is in the anterior lateral aspect of the left shoulder with some radiation to her elbow but no radiation past the elbow.  No neck pain or any numbness/tingling consistently.  She denies any history of diabetes but she does have history of hyperthyroidism in the past.  She is right-hand dominant.  No history of shoulder surgery..                ROS: All systems reviewed are negative as they relate to the chief complaint within the history of present illness.  Patient denies fevers or chills.  Assessment & Plan: Visit Diagnoses:  1. Adhesive capsulitis of left shoulder   2. Left shoulder pain, unspecified chronicity     Plan: Patient is a 60 year old female who returns complaining of left shoulder pain, similar to the previous evaluation in November 2021.  She has  continued pain that wakes her up at night if she lays on the left-hand side.  Never ended up receiving injection.  She has excellent rotator cuff strength but she does have loss of passive motion of the left shoulder consistent with adhesive capsulitis.  Discussed adhesive capsulitis including the options available to treat and the course of the pathology.  After discussion, patient would like to go to [facility] physical therapy for exercises for her left shoulder as she has had success with them in the past for her neck PT.  Additionally, she will have ultrasound-guided glenohumeral left shoulder injection by Dr. Junius Roads today.  Follow-up in 6 weeks for clinical recheck on range of motion.  Patient agreed with this plan.  Follow-Up Instructions: No follow-ups on file.   Orders:  Orders Placed This Encounter  Procedures   US Guided Needle Placement   No orders of the defined types were placed in this encounter.     Procedures: No procedures performed   Clinical Data: No additional findings.  Objective: Vital Signs: There were no vitals taken for this visit.  Physical Exam:  Constitutional: Patient appears well-developed HEENT:  Head: Normocephalic Eyes:EOM are normal Neck: Normal range of motion Cardiovascular: Normal rate Pulmonary/chest: Effort normal Neurologic: Patient is  alert Skin: Skin is warm Psychiatric: Patient has normal mood and affect  Ortho Exam: Ortho exam demonstrates right shoulder with 60 degrees external rotation, 90 degrees abduction, 170 degrees forward flexion.  This is compared to the left shoulder 30 degrees external rotation, 80 degrees abduction, 130 degrees forward flexion.  Excellent rotator cuff strength of supra, infra, subscap on the left-hand side.  +5 motor strength of bilateral grip strength, finger abduction, pronation/supination, bicep, tricep, deltoid.  Difficulty with internal rotation relative to the right shoulder; she internally rotates on  the right to about the lower thoracic spine whereas on the left she only internally rotates to about her left buttocks.  Specialty Comments:  No specialty comments available.  Imaging: No results found.   PMFS History: Patient Active Problem List   Diagnosis Date Noted   Swelling 02/02/2021   Right lower quadrant abdominal pain 02/02/2021   History of COVID-19 10/17/2020   Shortness of breath 10/17/2020   COVID-19 10/12/2020   Dizziness 05/23/2020   Essential hypertension 06/11/2019   New daily persistent headache 06/11/2019   Memory loss 06/11/2019   Upper airway cough syndrome vs cough variant asthma 01/01/2019   Chest pain 12/09/2018   Prediabetes 10/07/2018   Preventative health care 08/07/2018   Anxiety 10/11/2017   S/P partial colectomy 06/18/2017   Multiple drug allergies 06/18/2017   Colitis 02/05/2017   Diarrhea 02/05/2017   Diverticular disease 57/84/6962   Helicobacter pylori (H. pylori) infection 12/13/2016   Routine screening for STI (sexually transmitted infection) 12/13/2016   Clostridium difficile colitis 12/13/2016   Diverticulitis 10/10/2016   Lower abdominal pain 09/29/2016   Palpitations 95/28/4132   Mild diastolic dysfunction 44/08/270   Graves disease 01/02/2016   Acute diverticulitis 12/07/2015   Acute bacterial sinusitis 10/04/2015   History of colonic polyps 11/15/2014   Left shoulder pain 09/28/2013   Left-sided weakness 09/16/2013   Obesity (BMI 30-39.9) with low erv on pfts 09/25/19 09/02/2013   Myalgia 06/21/2011   POSTMENOPAUSAL STATUS 09/07/2010   PAIN IN JOINT, MULTIPLE SITES 12/27/2009   LOW BACK PAIN, CHRONIC 12/27/2009   FATIGUE 12/27/2009   VAGINITIS 11/22/2009   DYSPEPSIA&OTHER SPEC DISORDERS FUNCTION STOMACH 09/01/2009   NAUSEA 08/03/2009   FLATULENCE-GAS-BLOATING 08/03/2009   ABDOMINAL PAIN RIGHT UPPER QUADRANT 07/06/2009   SUPRAPUBIC PAIN 07/06/2009   GANGLION CYST, HX OF 07/06/2009   BACK PAIN 05/04/2009    Hyperlipidemia 09/16/2008   UNSPECIFIED MYALGIA AND MYOSITIS 09/16/2008   Precordial pain 07/27/2008   GERD 53/66/4403   HELICOBACTER PYLORI GASTRITIS, HX OF 04/05/2008   OTHER ACUTE REACTIONS TO STRESS 03/16/2008   ABDOMINAL PAIN, EPIGASTRIC 03/16/2008   SHOULDER PAIN, RIGHT 10/01/2007   ELBOW PAIN, RIGHT 10/01/2007   Pain in limb 04/14/2007   DEPENDENT EDEMA, RIGHT LEG 04/14/2007   SHINGLES 04/08/2007   Asthma 03/18/2007   GANGLION CYST, WRIST, RIGHT 03/18/2007   BUNIONECTOMY, HX OF 03/18/2007   Past Medical History:  Diagnosis Date   Allergy    Anxiety    Asthma    pt has inhaler   Back pain    C. difficile colitis    Cataract    bil eyes   Chest pain    Chronic fatigue syndrome    Clostridium difficile colitis 12/13/2016   Diverticulitis    Dyspnea    Dysrhythmia    palpitations   Edema    Elevated blood pressure reading without diagnosis of hypertension    "just elevated when I'm in pain" (12/07/2015)   Fatty liver  Fibromyalgia    Ganglion of joint    right wrist   GERD (gastroesophageal reflux disease)    H. pylori infection    Headache    hx of   Helicobacter pylori (H. pylori) infection 12/13/2016   Herpes zoster without mention of complication    HTN (hypertension)    Hyperlipidemia    Hyperthyroidism    IBS (irritable bowel syndrome)    Leg edema    Multiple drug allergies 06/18/2017   Myalgia    Nontraumatic rupture of Achilles tendon    Other acute reactions to stress    Pain in joint, shoulder region    Pain in joint, upper arm    Pain in limb    Palpitations    Personal history of other diseases of digestive system    Pneumonia    once   PONV (postoperative nausea and vomiting)    Routine screening for STI (sexually transmitted infection) 12/13/2016   S/P partial colectomy 06/18/2017   Thyroid disease    hyperthyroidism   Type 2 diabetes mellitus with hyperglycemia, without long-term current use of insulin ([place]) 08/07/2018   Vitamin B  12 deficiency     Family History  Problem Relation Age of Onset   Hypertension Father    Diabetes Father    Heart disease Father    Kidney disease Father        Died, 51   Hyperlipidemia Father    Leukemia Brother    Cancer Brother        myleoblastic anemia   Hyperlipidemia Mother    Hypertension Mother        Died, 21   Thyroid disease Mother        Thyroid surgery   Obesity Mother    Heart disease Mother    Pernicious anemia Sister    Thyroid disease Sister        On thyroid Rx   Lupus Daughter    Coronary artery disease Brother    Hypertension Brother    Colon cancer Neg Hx    Esophageal cancer Neg Hx    Rectal cancer Neg Hx    Stomach cancer Neg Hx    Pancreatic cancer Neg Hx    Prostate cancer Neg Hx    Colon polyps Neg Hx     Past Surgical History:  Procedure Laterality Date   ABDOMINAL HYSTERECTOMY     ACHILLES TENDON REPAIR Right 2005   BUNIONECTOMY Bilateral    Bunionectomy 1983   COLON SURGERY  01/23/2017   6 to 8 inches sigmoid colon removed   COLONOSCOPY     GANGLION CYST EXCISION Right    wrist   LEFT HEART CATH AND CORONARY ANGIOGRAPHY N/A 10/03/2016   Procedure: Left Heart Cath and Coronary Angiography;  Surgeon: Burnell Blanks, MD;  Location: [facility];  Service: Cardiovascular;  Laterality: N/A;   MENISCUS REPAIR Right 04/2014   POLYPECTOMY     TUBAL LIGATION     wisdomteeth extraction     Social History   Occupational History   Occupation: Personnel officer  Tobacco Use   Smoking status: Never   Smokeless tobacco: Never  Vaping Use   Vaping Use: Never used  Substance and Sexual Activity   Alcohol use: No    Alcohol/week: 0.0 standard drinks   Drug use: No   Sexual activity: Not Currently    Partners: Male

## 2021-02-07 ENCOUNTER — Telehealth: Payer: Self-pay | Admitting: Orthopedic Surgery

## 2021-02-07 NOTE — Telephone Encounter (Signed)
Scheduled

## 2021-02-07 NOTE — Telephone Encounter (Signed)
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  Patient asked if Dr Marlou Sa could see her tomorrow at anytime. Patient said she can not left her arm. The number to contact patient is [phone]

## 2021-02-08 ENCOUNTER — Telehealth: Payer: Self-pay | Admitting: *Deleted

## 2021-02-08 ENCOUNTER — Ambulatory Visit (HOSPITAL_BASED_OUTPATIENT_CLINIC_OR_DEPARTMENT_OTHER): Payer: 59

## 2021-02-08 ENCOUNTER — Ambulatory Visit: Payer: 59 | Admitting: Orthopedic Surgery

## 2021-02-08 NOTE — Telephone Encounter (Signed)
-----   Message from [name], [place] sent at 02/06/2021 10:32 PM EDT ----- Insulin resistance--- insulin elevated ---  can try metformin xr 500 mg 1 po qd #30  2 refills May help with weight loss as well  Recheck 3 months

## 2021-02-08 NOTE — Telephone Encounter (Signed)
Patient said she is cannot take the metformin, she was given it by [facility] and it caused memory issues and headaches.  I have added this to her allergy list.  She also wanted to know if she is now considered pre-diabetic.  She is willing to try something else other than metformin.

## 2021-02-09 ENCOUNTER — Encounter: Payer: Self-pay | Admitting: Family Medicine

## 2021-02-09 NOTE — Telephone Encounter (Signed)
Do not see message about rybelsus.  What sig and qty?

## 2021-02-09 NOTE — Telephone Encounter (Signed)
She also wanted to know is she pre diabetic now?

## 2021-02-10 ENCOUNTER — Other Ambulatory Visit (HOSPITAL_COMMUNITY): Payer: Self-pay

## 2021-02-10 ENCOUNTER — Other Ambulatory Visit: Payer: Self-pay

## 2021-02-10 MED ORDER — RYBELSUS 3 MG PO TABS
3.0000 mg | ORAL_TABLET | Freq: Every day | ORAL | 0 refills | Status: AC
  Filled 2021-02-10: qty 30, 30d supply, fill #0

## 2021-02-10 NOTE — Telephone Encounter (Signed)
Rx sent 

## 2021-02-13 ENCOUNTER — Ambulatory Visit: Payer: 59 | Admitting: Orthopedic Surgery

## 2021-02-14 ENCOUNTER — Telehealth: Payer: Self-pay | Admitting: Gastroenterology

## 2021-02-14 ENCOUNTER — Other Ambulatory Visit (HOSPITAL_COMMUNITY): Payer: Self-pay

## 2021-02-14 ENCOUNTER — Other Ambulatory Visit: Payer: Self-pay | Admitting: Family Medicine

## 2021-02-14 DIAGNOSIS — J302 Other seasonal allergic rhinitis: Secondary | ICD-10-CM

## 2021-02-14 DIAGNOSIS — M7502 Adhesive capsulitis of left shoulder: Secondary | ICD-10-CM | POA: Diagnosis not present

## 2021-02-14 MED ORDER — FLUTICASONE PROPIONATE 50 MCG/ACT NA SUSP
2.0000 | Freq: Every day | NASAL | 1 refills | Status: DC
Start: 2021-02-14 — End: 2021-07-24
  Filled 2021-02-14: qty 16, 30d supply, fill #0
  Filled 2021-04-11: qty 16, 30d supply, fill #1

## 2021-02-14 MED FILL — Dexlansoprazole Cap Delayed Release 60 MG: ORAL | 90 days supply | Qty: 90 | Fill #0 | Status: CN

## 2021-02-14 NOTE — Telephone Encounter (Signed)
Called and spoke to [facility]. They indicate she has refills remaining but insurance is requiring a PA stating she has tried and failed other medication within the last 120 days.  Patient has upcoming appointment with Dr. Havery Moros in 2 weeks.  Called patient. She indicates she has tried and failed Omeprazole Nexium, Prevacid and zantac in the past and this is the only thing that works for her. We agreed we will discuss with Dr. Havery Moros at her appointment on 7-14. She said she has enough medication to get to that appointment.

## 2021-02-14 NOTE — Telephone Encounter (Signed)
Inbound call from patient requesting additional refills for Dexilant medication be sent to pharmacy in chart please.

## 2021-02-15 ENCOUNTER — Encounter (HOSPITAL_BASED_OUTPATIENT_CLINIC_OR_DEPARTMENT_OTHER): Payer: Self-pay

## 2021-02-15 ENCOUNTER — Ambulatory Visit (HOSPITAL_BASED_OUTPATIENT_CLINIC_OR_DEPARTMENT_OTHER)
Admission: RE | Admit: 2021-02-15 | Discharge: 2021-02-15 | Disposition: A | Discharge status: Home / Self Care | Payer: 59 | Source: Ambulatory Visit | Attending: Family Medicine | Admitting: Family Medicine

## 2021-02-15 ENCOUNTER — Other Ambulatory Visit: Payer: Self-pay

## 2021-02-15 DIAGNOSIS — R1030 Lower abdominal pain, unspecified: Secondary | ICD-10-CM | POA: Insufficient documentation

## 2021-02-15 DIAGNOSIS — R1031 Right lower quadrant pain: Secondary | ICD-10-CM | POA: Insufficient documentation

## 2021-02-15 DIAGNOSIS — K760 Fatty (change of) liver, not elsewhere classified: Secondary | ICD-10-CM | POA: Diagnosis not present

## 2021-02-15 DIAGNOSIS — R109 Unspecified abdominal pain: Secondary | ICD-10-CM | POA: Diagnosis not present

## 2021-02-15 MED ORDER — IOHEXOL 300 MG/ML  SOLN
100.0000 mL | Freq: Once | INTRAMUSCULAR | Status: AC | PRN
  Administered 2021-02-15: 100 mL via INTRAVENOUS

## 2021-02-16 DIAGNOSIS — M7502 Adhesive capsulitis of left shoulder: Secondary | ICD-10-CM | POA: Diagnosis not present

## 2021-02-17 ENCOUNTER — Other Ambulatory Visit (HOSPITAL_COMMUNITY): Payer: Self-pay

## 2021-03-02 ENCOUNTER — Encounter: Payer: Self-pay | Admitting: Gastroenterology

## 2021-03-02 ENCOUNTER — Telehealth: Payer: Self-pay

## 2021-03-02 ENCOUNTER — Ambulatory Visit: Payer: 59 | Admitting: Gastroenterology

## 2021-03-02 ENCOUNTER — Other Ambulatory Visit (HOSPITAL_COMMUNITY): Payer: Self-pay

## 2021-03-02 VITALS — BP 114/78 | HR 79 | Ht 65.0 in | Wt 243.1 lb

## 2021-03-02 DIAGNOSIS — K9049 Malabsorption due to intolerance, not elsewhere classified: Secondary | ICD-10-CM

## 2021-03-02 DIAGNOSIS — K760 Fatty (change of) liver, not elsewhere classified: Secondary | ICD-10-CM | POA: Diagnosis not present

## 2021-03-02 DIAGNOSIS — K219 Gastro-esophageal reflux disease without esophagitis: Secondary | ICD-10-CM

## 2021-03-02 DIAGNOSIS — R109 Unspecified abdominal pain: Secondary | ICD-10-CM

## 2021-03-02 DIAGNOSIS — R140 Abdominal distension (gaseous): Secondary | ICD-10-CM

## 2021-03-02 DIAGNOSIS — R194 Change in bowel habit: Secondary | ICD-10-CM

## 2021-03-02 MED ORDER — RIFAXIMIN 550 MG PO TABS: 550.0000 mg | ORAL_TABLET | Freq: Three times a day (TID) | ORAL | 0 refills | Status: AC

## 2021-03-02 MED ORDER — DEXLANSOPRAZOLE 60 MG PO CPDR
60.0000 mg | DELAYED_RELEASE_CAPSULE | Freq: Every day | ORAL | 3 refills | Status: DC
  Filled 2021-03-02 – 2021-03-06 (×2): qty 90, 90d supply, fill #0
  Filled 2021-06-02 – 2021-06-13 (×2): qty 90, 90d supply, fill #1

## 2021-03-02 NOTE — Progress Notes (Signed)
HPI :  60 year old female here for a follow-up visit for food intolerance, GERD.  She prior notes for details of her case.  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 difficile, history of GERD.  I previously saw her last year November, she had endorsed intolerance to seafood and was having loose stools and upset stomach when she would eat it.  She also had a lot of bloating and gas production in general.  Her last colonoscopy was in 2020 without any high risk pathology.  I gave her an empiric trial of rifaximin for 2 weeks and she states that worked really well for her and she was able to eat what ever she wanted for a few months and tolerated it well with fairly regular bowel habits.  She states again now she is having problem eating fish in particular as well as milk which has been a problem for her historically.  She states whenever she eats these foods she has loose stools and some abdominal cramping.  She has avoided eating fish and states she feels much better and does not have the symptoms.  She drinks "fair life" milk in general and that works better for her than anything else including Lactaid.  If she uses that type of milk she can tolerate it.  Inquires about another course of rifaximin.  She was in the emergency department 2 weeks ago for some intermittent right lower sided pain that was ongoing for 1 week.  She states it was severe and intermittent and she was concerned about possible appendicitis and had a CT scan done which did not show any concerning pathology.  She was noted to have hepatic steatosis on that exam.  On review of her chart she has had fatty liver noted dating back to 2018 at least.  Her liver function testings were normal historically.  She denies any alcohol use at all.  She has worked on weight loss in the past but has been very frustrated with this.  In fact she is  been to the: Weight loss clinic for this, was on metformin and another regimens to help her lose weight which has not been successful.  She has discussed gastric bypass but is not interested in that.  She does drink coffee routinely.  We discussed fatty liver in general.    Endoscopic history: EGD 04/2008 - normal esophagus, H pylori gastriits, normal duodenum Colonoscopy in 2012 with a 21m descending colon polyp removed but not retrieved. Colonoscopy  09/30/2015 - seven small colon polyps - adenomas, sessile serrated, recall in 3 years, pancolonic diverticulosis, and hemorrhoids EGD 05/17/16 - normal esophagus, benign gastric polyps, gastritis, normal duodenum. Biopsies show no celiac, gastric polyps benign, H pylori gastritis noted EGD 06/04/2017 - gastritis, biposies taken, prominent ampulla - 2cm HH, otherwise normal esophagus - H pylori attempted to be cultured and could not. She followed up with ID   Colonoscopy 10/16/18 - One 3 mm polyp in the sigmoid colon, removed with a cold snare. Resected and retrieved. - One diminutive polyp at the recto-sigmoid colon, removed with a cold snare. Resected and retrieved. - One 3 mm polyp in the rectum, removed with a cold snare. Resected and retrieved. - Diverticulosis in the entire examined colon. - End-to-end colo-colonic anastomosis, characterized by healthy appearing mucosa. - The examination was otherwise normal.   EGD 10/16/18 - 1 cm hiatal hernia. - Normal esophagus - Erythematous  mucosa in the gastric fundus and gastric body. - Normal stomach otherwise - biopsies taken to rule out H pylori and negative - Normal duodenal bulb and second portion of the duodenum.     Echo 12/30/18 - EF 55-60%   CT abdomen/ pelvis 02/15/21 - IMPRESSION: Small fat containing periumbilical hernia. Hepatic steatosis. No other abnormality seen in the abdomen or pelvis.     Past Medical History:  Diagnosis Date   Allergy    Anxiety    Asthma    pt has  inhaler   Back pain    C. difficile colitis    Cataract    bil eyes   Chest pain    Chronic fatigue syndrome    Clostridium difficile colitis 12/13/2016   Diverticulitis    Dyspnea    Dysrhythmia    palpitations   Edema    Elevated blood pressure reading without diagnosis of hypertension    "just elevated when I'm in pain" (12/07/2015)   Fatty liver    Fibromyalgia    Ganglion of joint    right wrist   GERD (gastroesophageal reflux disease)    H. pylori infection    Headache    hx of   Helicobacter pylori (H. pylori) infection 12/13/2016   Herpes zoster without mention of complication    HTN (hypertension)    Hyperlipidemia    Hyperthyroidism    IBS (irritable bowel syndrome)    Leg edema    Multiple drug allergies 06/18/2017   Myalgia    Nontraumatic rupture of Achilles tendon    Other acute reactions to stress    Pain in joint, shoulder region    Pain in joint, upper arm    Pain in limb    Palpitations    Personal history of other diseases of digestive system    Pneumonia    once   PONV (postoperative nausea and vomiting)    Routine screening for STI (sexually transmitted infection) 12/13/2016   S/P partial colectomy 06/18/2017   Thyroid disease    hyperthyroidism   Type 2 diabetes mellitus with hyperglycemia, without long-term current use of insulin ([place]) 08/07/2018   Vitamin B 12 deficiency      Past Surgical History:  Procedure Laterality Date   ABDOMINAL HYSTERECTOMY     ACHILLES TENDON REPAIR Right 2005   BUNIONECTOMY Bilateral    Bunionectomy 1983   COLON SURGERY  01/23/2017   6 to 8 inches sigmoid colon removed   COLONOSCOPY     GANGLION CYST EXCISION Right    wrist   LEFT HEART CATH AND CORONARY ANGIOGRAPHY N/A 10/03/2016   Procedure: Left Heart Cath and Coronary Angiography;  Surgeon: Burnell Blanks, MD;  Location: [facility];  Service: Cardiovascular;  Laterality: N/A;   MENISCUS REPAIR Right 04/2014   POLYPECTOMY     TUBAL  LIGATION     wisdomteeth extraction     Family History  Problem Relation Age of Onset   Hypertension Father    Diabetes Father    Heart disease Father    Kidney disease Father        Died, 35   Hyperlipidemia Father    Leukemia Brother    Cancer Brother        myleoblastic anemia   Hyperlipidemia Mother    Hypertension Mother        Died, 81   Thyroid disease Mother        Thyroid surgery   Obesity Mother  Heart disease Mother    Pernicious anemia Sister    Thyroid disease Sister        On thyroid Rx   Lupus Daughter    Coronary artery disease Brother    Hypertension Brother    Colon cancer Neg Hx    Esophageal cancer Neg Hx    Rectal cancer Neg Hx    Stomach cancer Neg Hx    Pancreatic cancer Neg Hx    Prostate cancer Neg Hx    Colon polyps Neg Hx    Social History   Tobacco Use   Smoking status: Never   Smokeless tobacco: Never  Vaping Use   Vaping Use: Never used  Substance Use Topics   Alcohol use: No    Alcohol/week: 0.0 standard drinks   Drug use: No   Current Outpatient Medications  Medication Sig Dispense Refill   acetaminophen (TYLENOL) 500 MG tablet Take 1,000 mg by mouth every 6 (six) hours as needed for moderate pain.     albuterol (PROAIR HFA) 108 (90 Base) MCG/ACT inhaler USE 2 PUFFS EVERY 4 HOURS AS NEEDED FOR COUGH, WHEEZE OR SHORTNESS OF BREATH. 8.5 each 2   ALPRAZolam (XANAX) 0.25 MG tablet TAKE 1 TABLET BY MOUTH THREE TIMES A DAY AS NEEDED 30 tablet 2   AMBULATORY NON FORMULARY MEDICATION Medication Name: [facility]  Apply to affected area as needed 30 g 11   azelastine (OPTIVAR) 0.05 % ophthalmic solution Place 1 drop into both eyes 2 (two) times daily as needed.     Blood Glucose Monitoring Suppl (FREESTYLE FREEDOM) KIT 1 Device by Does not apply route daily. 1 each 0   budesonide-formoterol (SYMBICORT) 80-4.5 MCG/ACT inhaler INHALE 2 PUFFS BY MOUTH FIRST THING IN THE MORNING  AND THEN ANOTHER 2 PUFFS ABOUT 12 HOURS LATER 10.2 g 4   COVID-19 mRNA vaccine, Pfizer, 30 MCG/0.3ML injection INJECT AS DIRECTED .3 mL 0   dexlansoprazole (DEXILANT) 60 MG capsule TAKE 1 CAPSULE (60 MG TOTAL) BY MOUTH DAILY. 90 capsule 3   diclofenac sodium (VOLTAREN) 1 % GEL Apply 2 g topically 4 (four) times daily. Rub into affected area of foot 2 to 4 times daily 100 g 2   famotidine (PEPCID) 20 MG tablet One after supper 30 tablet 11   fluticasone (FLONASE) 50 MCG/ACT nasal spray USE 2 SPRAYS INTO BOTH NOSTRILS DAILY. 16 g 1   glucose blood (FREESTYLE LITE) test strip USE TO CHECK BLOOD SUGAR ONCE A DAY 100 each 1   hydrochlorothiazide (HYDRODIURIL) 25 MG tablet Take 1 tablet (25 mg total) by mouth daily. 90 tablet 3   Iron Combinations (I.L.X. B-12) ELIX Take 1 each by mouth daily as needed (energy). 1 droplet full     levocetirizine (XYZAL) 5 MG tablet TAKE 1 TABLET BY MOUTH EVERY EVENING. 90 tablet 3   lidocaine (LIDODERM) 5 % Apply to affected area QD PRN for a Week 30 patch 0   rosuvastatin (CRESTOR) 10 MG tablet Take 1 tablet (10 mg total) by mouth daily. 30 tablet 2   Semaglutide (RYBELSUS) 3 MG TABS Take 1 tablet by mouth daily. 30 tablet 0   valACYclovir (VALTREX) 1000 MG tablet Take 1,000 mg by mouth 3 (three) times daily as needed.     Vitamin D, Ergocalciferol, (DRISDOL) 1.25 MG (50000 UNIT) CAPS capsule TAKE 1 CAPSULE (50,000 UNITS TOTAL) BY MOUTH EVERY 7 (SEVEN) DAYS. 12 capsule 1   No current facility-administered medications for this visit.  Allergies  Allergen Reactions   Aspirin Other (See Comments)    REACTION: anaphylaxis   Gentamicin Other (See Comments)    Eye drops turned the sclera bright red   Ibuprofen Other (See Comments)    REACTION: anaphylaxsis   Metronidazole Swelling    REACTION: red face/swelling   Nsaids Other (See Comments)    REACTION: anaphylaxis   Doxycycline Other (See Comments)    REACTION: severe nausea/vomiting   Influenza A (H1n1)  [place] Vac Other (See Comments)    REACTION: sick for 3 weeks   Loratadine Other (See Comments)    Fatigue/weakness   Metformin And Related Other (See Comments)    Memory issues and headache   Periactin [Cyproheptadine] Other (See Comments)    paranoid    Ranitidine Hcl Other (See Comments)    REACTION: Lips turned red /peel   Sulfamethoxazole-Trimethoprim Other (See Comments)    REACTION: face red/peel   Tramadol Hcl Other (See Comments)    REACTION: paranoid     Review of Systems: All systems reviewed and negative except where noted in HPI.    CT Abdomen Pelvis W Contrast  Result Date: 02/15/2021 CLINICAL DATA:  Right lower quadrant abdominal pain. EXAM: CT ABDOMEN AND PELVIS WITH CONTRAST TECHNIQUE: Multidetector CT imaging of the abdomen and pelvis was performed using the standard protocol following bolus administration of intravenous contrast. CONTRAST:  139m OMNIPAQUE IOHEXOL 300 MG/ML  SOLN COMPARISON:  February 05, 2017. FINDINGS: Lower chest: No acute abnormality. Hepatobiliary: No focal liver abnormality is seen. Hepatic steatosis. No gallstones, gallbladder wall thickening, or biliary dilatation. Pancreas: Unremarkable. No pancreatic ductal dilatation or surrounding inflammatory changes. Spleen: Normal in size without focal abnormality. Adrenals/Urinary Tract: Adrenal glands are unremarkable. Kidneys are normal, without renal calculi, focal lesion, or hydronephrosis. Bladder is unremarkable. Stomach/Bowel: Stomach is within normal limits. Appendix appears normal. No evidence of bowel wall thickening, distention, or inflammatory changes. Vascular/Lymphatic: No significant vascular findings are present. No enlarged abdominal or pelvic lymph nodes. Reproductive: Status post hysterectomy. No adnexal masses. Other: Small fat containing periumbilical hernia is noted. No ascites is noted. Musculoskeletal: No acute or significant osseous findings. IMPRESSION: Small fat containing  periumbilical hernia. Hepatic steatosis. No other abnormality seen in the abdomen or pelvis. Electronically Signed   By: JMarijo ConceptionM.D.   On: 02/15/2021 10:28   [place]ided Needle Placement  Result Date: 02/03/2021 Ultrasound guided injection is preferred based studies that show increased duration, increased effect, greater accuracy, decreased procedural pain, increased response rate, and decreased cost with ultrasound guided versus blind injection.   Verbal informed consent obtained.  Time-out conducted.  Noted no overlying erythema, induration, or other signs of local infection. Ultrasound-guided left glenohumeral injection: After sterile prep with Betadine, injected 4 cc 0.25% bupivocaine without epinephrine and 6 mg betamethasone using a 22-gauge spinal needle, passing the needle from posterior approach into the glenohumeral joint.  Injectate seen filling joint capsule.    MM Digital Diagnostic Unilat R  Result Date: 02/03/2021 CLINICAL DATA:  Screening recall for right breast calcifications. EXAM: DIGITAL DIAGNOSTIC UNILATERAL RIGHT MAMMOGRAM TECHNIQUE: Right digital diagnostic mammography was performed. Mammographic images were processed with CAD. COMPARISON:  Previous exam(s). ACR Breast Density Category b: There are scattered areas of fibroglandular density. FINDINGS: In the lower, slightly outer aspect of the right breast, there is a 2 mm group of 4 calcifications, 1 of which has polygonal shape, the other 3 small and round. There is no linearity or branching. There is no distortion.  IMPRESSION: Probably benign right breast calcifications. Short-term follow-up recommended. RECOMMENDATION: Diagnostic right breast mammography with magnification views in 6 months. I have discussed the findings and recommendations with the patient. If applicable, a reminder letter will be sent to the patient regarding the next appointment. BI-RADS CATEGORY  3: Probably benign. Electronically Signed   By: Lajean Manes M.D.   On: 02/03/2021 13:52   Lab Results  Component Value Date   ALT 33 02/02/2021   AST 26 02/02/2021   ALKPHOS 69 02/02/2021   BILITOT 0.4 02/02/2021    Lab Results  Component Value Date   WBC 4.6 02/02/2021   HGB 13.2 02/02/2021   HCT 40.1 02/02/2021   MCV 88.4 02/02/2021   PLT 240.0 02/02/2021    Lab Results  Component Value Date   CREATININE 0.93 02/02/2021   BUN 16 02/02/2021   NA 141 02/02/2021   K 4.0 02/02/2021   CL 106 02/02/2021   CO2 30 02/02/2021      Physical Exam: BP 114/78 (BP Location: Left Arm, Patient Position: Sitting, Cuff Size: Large)   Pulse 79   Ht _0  (1.651 m)   Wt 243 lb 2 oz (110.3 kg)   SpO2 98%   BMI 40.46 kg/m  Constitutional: Pleasant,well-developed, female in no acute distress. Abdominal: Soft, nondistended, nontender. . There are no masses palpable.  Extremities: no edema Lymphadenopathy: No cervical adenopathy noted. Neurological: Alert and oriented to person place and time. Skin: Skin is warm and dry. No rashes noted. Psychiatric: Normal mood and affect. Behavior is normal.   ASSESSMENT AND PLAN: 60 year old female here for reassessment of the following:  Food intolerance Altered bowel habits Bloating Fatty liver GERD Abdominal pain  As above patient with recurrent intolerance to seafood/fish which she really likes to eat but just does not tolerate it very well especially if whitefish.  If she does not eat it and she feels much better in general.  She had a lot of bloating and gas the last time I saw her and I gave her an empiric trial of rifaximin.  She states this allowed her to tolerate fish much better and she did quite well for several months.  I do think she has food intolerance and a functional bowel disorder at baseline given her work-up to date.  Given her gas and bloating I offered her another course of rifaximin, we had a free sample today so we can give this to her for no charge.  I do think if she has  severe food intolerances to these particular food she should try to avoid them and minimize use if it really bothers her.  We discussed fatty liver in general, weight loss would be the best way to reverse this and prevent fibrotic change however she is really struggled with weight loss despite seeing weight loss clinic.  She is not interested in bariatric surgery.  We will continue to keep an eye on her LFTs, she does not drink alcohol, she does drink coffee routinely which can help prevent fibrotic change.  Otherwise reflux very well controlled with Dexilant at this time and she is anxious that she may not be able to continue it due to insurance purposes.  She has failed numerous other regimens and so we will try to do a prior authorization for this.  EGD is up-to-date, no BE.  Of note had severe right lower quadrant pain that led to ED visit and CT scan but CT negative and pain has since  completely resolved.  We will monitor  Plan: - trial of Rifaximin 550m TID for 2 weeks - minimize intake of food triggers if they bother her significantly - continue Dexilant 628m/ day - discussed fatty liver, ALT normal, continued attempts at weight loss, regular coffee intake is good, LFTs yearly  StJolly MangoMD [facility]

## 2021-03-02 NOTE — Patient Instructions (Addendum)
If you are age 61 or older, your body mass index should be between 23-30. Your Body mass index is 40.46 kg/m. If this is out of the aforementioned range listed, please consider follow up with your Primary Care Provider.  If you are age 49 or younger, your body mass index should be between 19-25. Your Body mass index is 40.46 kg/m. If this is out of the aformentioned range listed, please consider follow up with your Primary Care Provider.   __________________________________________________________  The [facility] providers would like to encourage you to use [facility] to communicate with providers for non-urgent requests or questions.  Due to long hold times on the telephone, sending your provider a message by [facility] may be a faster and more efficient way to get a response.  Please allow 48 business hours for a response.  Please remember that this is for non-urgent requests.    We have given you samples of the following medication to take: Xifaxan 550 mg: Take once tablet 3 times a day for 14 days   We have sent the following medications to your pharmacy for you to pick up at your convenience: Dexilant 60 mg: Take once daily    Thank you for entrusting me with your care and for choosing [employer], Dr. [place]

## 2021-03-02 NOTE — Telephone Encounter (Signed)
PA for Dexilant submitted via CoverMyMeds. Key: [id number] - PA Case ID: [id number] Patient has GERD and has tried and failed esomeprazole, omeprazole and lansoprazole.

## 2021-03-06 ENCOUNTER — Other Ambulatory Visit (HOSPITAL_COMMUNITY): Payer: Self-pay

## 2021-03-06 ENCOUNTER — Other Ambulatory Visit (HOSPITAL_BASED_OUTPATIENT_CLINIC_OR_DEPARTMENT_OTHER): Payer: Self-pay

## 2021-03-06 NOTE — Telephone Encounter (Signed)
PA approved through [place] #[id number] Max of 12 fills from 03-02-21- 03-01-22. [phone]. Patient notified.

## 2021-03-07 DIAGNOSIS — M7502 Adhesive capsulitis of left shoulder: Secondary | ICD-10-CM | POA: Diagnosis not present

## 2021-03-10 ENCOUNTER — Other Ambulatory Visit (HOSPITAL_COMMUNITY): Payer: Self-pay

## 2021-03-16 ENCOUNTER — Telehealth: Payer: Self-pay | Admitting: Family Medicine

## 2021-03-16 NOTE — Telephone Encounter (Signed)
Pt had documents to pick up at front office ( FMLA -6 pages [employer]) Pt did not pick up documents. Documents mailed to pt.

## 2021-04-11 ENCOUNTER — Other Ambulatory Visit (HOSPITAL_BASED_OUTPATIENT_CLINIC_OR_DEPARTMENT_OTHER): Payer: Self-pay

## 2021-04-11 ENCOUNTER — Other Ambulatory Visit: Payer: Self-pay | Admitting: Family Medicine

## 2021-04-11 MED FILL — Levocetirizine Dihydrochloride Tab 5 MG: ORAL | 90 days supply | Qty: 90 | Fill #1 | Status: AC

## 2021-04-12 ENCOUNTER — Encounter: Payer: Self-pay | Admitting: Family Medicine

## 2021-04-12 ENCOUNTER — Other Ambulatory Visit (HOSPITAL_BASED_OUTPATIENT_CLINIC_OR_DEPARTMENT_OTHER): Payer: Self-pay

## 2021-04-12 MED ORDER — RYBELSUS 7 MG PO TABS
7.0000 mg | ORAL_TABLET | Freq: Every day | ORAL | 2 refills | Status: DC
  Filled 2021-04-12: qty 30, 30d supply, fill #0
  Filled 2021-05-20: qty 30, 30d supply, fill #1
  Filled 2021-06-21: qty 30, 30d supply, fill #2

## 2021-04-14 ENCOUNTER — Other Ambulatory Visit (HOSPITAL_BASED_OUTPATIENT_CLINIC_OR_DEPARTMENT_OTHER): Payer: Self-pay

## 2021-05-19 ENCOUNTER — Ambulatory Visit: Payer: 59 | Admitting: Family Medicine

## 2021-05-19 NOTE — Progress Notes (Incomplete)
Subjective:   By signing my name below, I, [name], attest that this documentation has been prepared under the direction and in the presence of [name], DO. 05/19/2021     Patient ID: Emily Osborne, female    DOB: Mar 30, 1961, 60 y.o.   MRN: [id number]  No chief complaint on file.   HPI Patient is in today for a office visit.   Past Medical History:  Diagnosis Date   Allergy    Anxiety    Asthma    pt has inhaler   Back pain    C. difficile colitis    Cataract    bil eyes   Chest pain    Chronic fatigue syndrome    Clostridium difficile colitis 12/13/2016   Diverticulitis    Dyspnea    Dysrhythmia    palpitations   Edema    Elevated blood pressure reading without diagnosis of hypertension    "just elevated when I'm in pain" (12/07/2015)   Fatty liver    Fibromyalgia    Ganglion of joint    right wrist   GERD (gastroesophageal reflux disease)    H. pylori infection    Headache    hx of   Helicobacter pylori (H. pylori) infection 12/13/2016   Herpes zoster without mention of complication    HTN (hypertension)    Hyperlipidemia    Hyperthyroidism    IBS (irritable bowel syndrome)    Leg edema    Multiple drug allergies 06/18/2017   Myalgia    Nontraumatic rupture of Achilles tendon    Other acute reactions to stress    Pain in joint, shoulder region    Pain in joint, upper arm    Pain in limb    Palpitations    Personal history of other diseases of digestive system    Pneumonia    once   PONV (postoperative nausea and vomiting)    Routine screening for STI (sexually transmitted infection) 12/13/2016   S/P partial colectomy 06/18/2017   Thyroid disease    hyperthyroidism   Type 2 diabetes mellitus with hyperglycemia, without long-term current use of insulin ([place]) 08/07/2018   Vitamin B 12 deficiency     Past Surgical History:  Procedure Laterality Date   ABDOMINAL HYSTERECTOMY     ACHILLES TENDON REPAIR Right 2005   BUNIONECTOMY  Bilateral    Bunionectomy 1983   COLON SURGERY  01/23/2017   6 to 8 inches sigmoid colon removed   COLONOSCOPY     GANGLION CYST EXCISION Right    wrist   LEFT HEART CATH AND CORONARY ANGIOGRAPHY N/A 10/03/2016   Procedure: Left Heart Cath and Coronary Angiography;  Surgeon: Burnell Blanks, MD;  Location: [facility];  Service: Cardiovascular;  Laterality: N/A;   MENISCUS REPAIR Right 04/2014   POLYPECTOMY     TUBAL LIGATION     wisdomteeth extraction      Family History  Problem Relation Age of Onset   Hypertension Father    Diabetes Father    Heart disease Father    Kidney disease Father        Died, 21   Hyperlipidemia Father    Leukemia Brother    Cancer Brother        myleoblastic anemia   Hyperlipidemia Mother    Hypertension Mother        Died, 62   Thyroid disease Mother        Thyroid surgery   Obesity  Mother    Heart disease Mother    Pernicious anemia Sister    Thyroid disease Sister        On thyroid Rx   Lupus Daughter    Coronary artery disease Brother    Hypertension Brother    Colon cancer Neg Hx    Esophageal cancer Neg Hx    Rectal cancer Neg Hx    Stomach cancer Neg Hx    Pancreatic cancer Neg Hx    Prostate cancer Neg Hx    Colon polyps Neg Hx     Social History   Socioeconomic History   Marital status: Divorced    Spouse name: Not on file   Number of children: 2   Years of education: Not on file   Highest education level: Not on file  Occupational History   Occupation: Personnel officer  Tobacco Use   Smoking status: Never   Smokeless tobacco: Never  Vaping Use   Vaping Use: Never used  Substance and Sexual Activity   Alcohol use: No    Alcohol/week: 0.0 standard drinks   Drug use: No   Sexual activity: Not Currently    Partners: Male  Other Topics Concern   Not on file  Social History Narrative   Lives with alone.  She has two daughter.   She works as a Programme researcher, broadcasting/film/video at [employer]--- no   Right Handed    Social Determinants of Radio broadcast assistant Strain: Not on file  Food Insecurity: Not on file  Transportation Needs: Not on file  Physical Activity: Not on file  Stress: Not on file  Social Connections: Not on file  Intimate Partner Violence: Not on file    Outpatient Medications Prior to Visit  Medication Sig Dispense Refill   acetaminophen (TYLENOL) 500 MG tablet Take 1,000 mg by mouth every 6 (six) hours as needed for moderate pain.     albuterol (PROAIR HFA) 108 (90 Base) MCG/ACT inhaler USE 2 PUFFS EVERY 4 HOURS AS NEEDED FOR COUGH, WHEEZE OR SHORTNESS OF BREATH. 8.5 each 2   ALPRAZolam (XANAX) 0.25 MG tablet TAKE 1 TABLET BY MOUTH THREE TIMES A DAY AS NEEDED 30 tablet 2   AMBULATORY NON FORMULARY MEDICATION Medication Name: C[facility]-A[facility] Dentist  Apply to affected area as needed 30 g 11   azelastine (OPTIVAR) 0.05 % ophthalmic solution Place 1 drop into both eyes 2 (two) times daily as needed.     Blood Glucose Monitoring Suppl (FREESTYLE FREEDOM) KIT 1 Device by Does not apply route daily. 1 each 0   budesonide-formoterol (SYMBICORT) 80-4.5 MCG/ACT inhaler INHALE 2 PUFFS BY MOUTH FIRST THING IN THE MORNING AND THEN ANOTHER 2 PUFFS ABOUT 12 HOURS LATER 10.2 g 4   COVID-19 mRNA vaccine, Pfizer, 30 MCG/0.3ML injection INJECT AS DIRECTED .3 mL 0   dexlansoprazole (DEXILANT) 60 MG capsule Take 1 capsule (60 mg total) by mouth daily. 90 capsule 3   diclofenac sodium (VOLTAREN) 1 % GEL Apply 2 g topically 4 (four) times daily. Rub into affected area of foot 2 to 4 times daily 100 g 2   famotidine (PEPCID) 20 MG tablet One after supper 30 tablet 11   fluticasone (FLONASE) 50 MCG/ACT nasal spray USE 2 SPRAYS INTO BOTH NOSTRILS DAILY. 16 g 1   glucose blood (FREESTYLE LITE) test strip USE TO CHECK BLOOD SUGAR ONCE A DAY 100 each 1   hydrochlorothiazide (HYDRODIURIL) 25 MG tablet Take 1 tablet (25  mg total) by mouth daily. 90  tablet 3   Iron Combinations (I.L.X. B-12) ELIX Take 1 each by mouth daily as needed (energy). 1 droplet full     levocetirizine (XYZAL) 5 MG tablet TAKE 1 TABLET BY MOUTH EVERY EVENING. 90 tablet 3   lidocaine (LIDODERM) 5 % Apply to affected area QD PRN for a Week 30 patch 0   rosuvastatin (CRESTOR) 10 MG tablet Take 1 tablet (10 mg total) by mouth daily. 30 tablet 2   Semaglutide (RYBELSUS) 7 MG TABS Take 1 tablet by mouth daily. 30 tablet 2   valACYclovir (VALTREX) 1000 MG tablet Take 1,000 mg by mouth 3 (three) times daily as needed.     Vitamin D, Ergocalciferol, (DRISDOL) 1.25 MG (50000 UNIT) CAPS capsule TAKE 1 CAPSULE (50,000 UNITS TOTAL) BY MOUTH EVERY 7 (SEVEN) DAYS. 12 capsule 1   No facility-administered medications prior to visit.    Allergies  Allergen Reactions   Aspirin Other (See Comments)    REACTION: anaphylaxis   Gentamicin Other (See Comments)    Eye drops turned the sclera bright red   Ibuprofen Other (See Comments)    REACTION: anaphylaxsis   Metronidazole Swelling    REACTION: red face/swelling   Nsaids Other (See Comments)    REACTION: anaphylaxis   Doxycycline Other (See Comments)    REACTION: severe nausea/vomiting   Influenza A (H1n1) [place] Vac Other (See Comments)    REACTION: sick for 3 weeks   Loratadine Other (See Comments)    Fatigue/weakness   Metformin And Related Other (See Comments)    Memory issues and headache   Periactin [Cyproheptadine] Other (See Comments)    paranoid    Ranitidine Hcl Other (See Comments)    REACTION: Lips turned red /peel   Sulfamethoxazole-Trimethoprim Other (See Comments)    REACTION: face red/peel   Tramadol Hcl Other (See Comments)    REACTION: paranoid    ROS     Objective:    Physical Exam Constitutional:      General: She is not in acute distress.    Appearance: Normal appearance. She is not ill-appearing.  HENT:     Head: Normocephalic and atraumatic.     Right Ear: External ear normal.      Left Ear: External ear normal.  Eyes:     Extraocular Movements: Extraocular movements intact.     Pupils: Pupils are equal, round, and reactive to light.  Cardiovascular:     Rate and Rhythm: Normal rate and regular rhythm.     Heart sounds: Normal heart sounds. No murmur heard.   No gallop.  Pulmonary:     Effort: Pulmonary effort is normal. No respiratory distress.     Breath sounds: Normal breath sounds. No wheezing or rales.  Skin:    General: Skin is warm and dry.  Neurological:     Mental Status: She is alert and oriented to person, place, and time.  Psychiatric:        Behavior: Behavior normal.        Judgment: Judgment normal.    There were no vitals taken for this visit. Wt Readings from Last 3 Encounters:  03/02/21 243 lb 2 oz (110.3 kg)  02/02/21 242 lb 12.8 oz (110.1 kg)  11/17/20 243 lb (110.2 kg)    Diabetic Foot Exam - Simple   No data filed    Lab Results  Component Value Date   WBC 4.6 02/02/2021   HGB 13.2 02/02/2021   HCT 40.1  02/02/2021   PLT 240.0 02/02/2021   GLUCOSE 89 02/02/2021   CHOL 179 02/02/2021   TRIG 128.0 02/02/2021   HDL 52.40 02/02/2021   LDLDIRECT 139.4 12/26/2011   LDLCALC 101 (H) 02/02/2021   ALT 33 02/02/2021   AST 26 02/02/2021   NA 141 02/02/2021   K 4.0 02/02/2021   CL 106 02/02/2021   CREATININE 0.93 02/02/2021   BUN 16 02/02/2021   CO2 30 02/02/2021   TSH 0.79 02/02/2021   INR 1.0 06/03/2020   HGBA1C 6.0 02/02/2021   MICROALBUR <0.7 11/17/2020    Lab Results  Component Value Date   TSH 0.79 02/02/2021   Lab Results  Component Value Date   WBC 4.6 02/02/2021   HGB 13.2 02/02/2021   HCT 40.1 02/02/2021   MCV 88.4 02/02/2021   PLT 240.0 02/02/2021   Lab Results  Component Value Date   NA 141 02/02/2021   K 4.0 02/02/2021   CO2 30 02/02/2021   GLUCOSE 89 02/02/2021   BUN 16 02/02/2021   CREATININE 0.93 02/02/2021   BILITOT 0.4 02/02/2021   ALKPHOS 69 02/02/2021   AST 26 02/02/2021   ALT 33  02/02/2021   PROT 7.2 02/02/2021   ALBUMIN 4.2 02/02/2021   CALCIUM 9.3 02/02/2021   ANIONGAP 10 06/03/2020   GFR 66.55 02/02/2021   Lab Results  Component Value Date   CHOL 179 02/02/2021   Lab Results  Component Value Date   HDL 52.40 02/02/2021   Lab Results  Component Value Date   LDLCALC 101 (H) 02/02/2021   Lab Results  Component Value Date   TRIG 128.0 02/02/2021   Lab Results  Component Value Date   CHOLHDL 3 02/02/2021   Lab Results  Component Value Date   HGBA1C 6.0 02/02/2021       Assessment & Plan:   Problem List Items Addressed This Visit   None    No orders of the defined types were placed in this encounter.   I, [name], DO, personally preformed the services described in this documentation.  All medical record entries made by the scribe were at my direction and in my presence.  I have reviewed the chart and discharge instructions (if applicable) and agree that the record reflects my personal performance and is accurate and complete. 05/19/2021   I,[name],acting as a scribe for [name], DO.,have documented all relevant documentation on the behalf of [name], DO,as directed by  [name], DO while in the presence of [name], DO.   Shehryar [employer]

## 2021-05-20 ENCOUNTER — Other Ambulatory Visit (HOSPITAL_COMMUNITY): Payer: Self-pay

## 2021-05-31 ENCOUNTER — Telehealth: Payer: Self-pay | Admitting: Orthopedic Surgery

## 2021-05-31 DIAGNOSIS — M7502 Adhesive capsulitis of left shoulder: Secondary | ICD-10-CM

## 2021-05-31 DIAGNOSIS — M25512 Pain in left shoulder: Secondary | ICD-10-CM

## 2021-05-31 NOTE — Telephone Encounter (Signed)
Contacted patient and Emily Osborne states that Emily Osborne would like to have referral placed to have injection with Dr. Ernestina Patches in her left shoulder. States that Emily Osborne does not which to have office visit prior to referral being placed.

## 2021-05-31 NOTE — Telephone Encounter (Signed)
Pt states she is a previous pt of [facility] and was referred to get injs but never went to get them. Pt wanted to speak to the nurse and see how she can get those referrals resent and who she would need to see since [facility] is no longer with our practice. The best call back number is [phone].

## 2021-05-31 NOTE — Telephone Encounter (Signed)
Sounds efficient pls arrange f/u with Ernestina Patches for gh joint injection thx

## 2021-06-01 NOTE — Telephone Encounter (Signed)
Referral has been placed. 

## 2021-06-02 ENCOUNTER — Telehealth: Payer: Self-pay | Admitting: Physical Medicine and Rehabilitation

## 2021-06-02 ENCOUNTER — Other Ambulatory Visit (HOSPITAL_BASED_OUTPATIENT_CLINIC_OR_DEPARTMENT_OTHER): Payer: Self-pay

## 2021-06-02 NOTE — Telephone Encounter (Signed)
Patient called. Returning a call to schedule with Dr. Newton.  ?

## 2021-06-05 ENCOUNTER — Other Ambulatory Visit (HOSPITAL_BASED_OUTPATIENT_CLINIC_OR_DEPARTMENT_OTHER): Payer: Self-pay

## 2021-06-13 ENCOUNTER — Other Ambulatory Visit (HOSPITAL_BASED_OUTPATIENT_CLINIC_OR_DEPARTMENT_OTHER): Payer: Self-pay

## 2021-06-21 ENCOUNTER — Other Ambulatory Visit (HOSPITAL_BASED_OUTPATIENT_CLINIC_OR_DEPARTMENT_OTHER): Payer: Self-pay

## 2021-06-22 ENCOUNTER — Other Ambulatory Visit: Payer: Self-pay

## 2021-06-22 ENCOUNTER — Encounter: Payer: Self-pay | Admitting: Physical Medicine and Rehabilitation

## 2021-06-22 ENCOUNTER — Ambulatory Visit (INDEPENDENT_AMBULATORY_CARE_PROVIDER_SITE_OTHER): Payer: No Typology Code available for payment source | Admitting: Physical Medicine and Rehabilitation

## 2021-06-22 ENCOUNTER — Ambulatory Visit: Payer: Self-pay

## 2021-06-22 DIAGNOSIS — G8929 Other chronic pain: Secondary | ICD-10-CM | POA: Diagnosis not present

## 2021-06-22 DIAGNOSIS — M25512 Pain in left shoulder: Secondary | ICD-10-CM | POA: Diagnosis not present

## 2021-06-22 NOTE — Progress Notes (Signed)
Pain and limited range of motion in left shoulder. Numeric Pain Rating Scale and Functional Assessment Average Pain 4   In the last MONTH (on 0-10 scale) has pain interfered with the following?  1. General activity like being  able to carry out your everyday physical activities such as walking, climbing stairs, carrying groceries, or moving a chair?  Rating(5)    -Dye Allergies.

## 2021-06-24 MED ORDER — TRIAMCINOLONE ACETONIDE 40 MG/ML IJ SUSP
80.0000 mg | INTRAMUSCULAR | Status: AC | PRN
  Administered 2021-06-22: 80 mg via INTRA_ARTICULAR

## 2021-06-24 MED ORDER — BUPIVACAINE HCL 0.25 % IJ SOLN
4.0000 mL | INTRAMUSCULAR | Status: AC | PRN
  Administered 2021-06-22: 4 mL via INTRA_ARTICULAR

## 2021-06-24 NOTE — Progress Notes (Signed)
   Emily Osborne - 60 y.o. female MRN [id number]  Date of birth: 1961-06-01  Office Visit Note: Visit Date: 06/22/2021 PCP: [name], DO Referred by: [name], *  Subjective: Chief Complaint  Patient presents with   Left Shoulder - Pain   HPI:  Emily Osborne is a 60 y.o. female who comes in today at the request of Dr. Anderson Malta for planned Left anesthetic glenohumeral arthrogram with fluoroscopic guidance.  The patient has failed conservative care including home exercise, medications, time and activity modification.  This injection will be diagnostic and hopefully therapeutic.  Please see requesting physician notes for further details and justification.   ROS Otherwise per HPI.  Assessment & Plan: Visit Diagnoses:    ICD-10-CM   1. Chronic left shoulder pain  M25.512 XR C-ARM NO REPORT   G89.29       Plan: No additional findings.   Meds & Orders: No orders of the defined types were placed in this encounter.   Orders Placed This Encounter  Procedures   Large Joint Inj   XR C-ARM NO REPORT    Follow-up: Return for visit to requesting physician as needed.   Procedures: Large Joint Inj: L glenohumeral on 06/22/2021 9:00 AM Indications: pain and diagnostic evaluation Details: 22 G 3.5 in needle, anteromedial approach  Arthrogram: Yes  Medications: 80 mg triamcinolone acetonide 40 MG/ML; 4 mL bupivacaine 0.25 %  Arthrogram demonstrated excellent flow of contrast throughout the joint surface without extravasation or obvious defect.  The patient had some relief of symptoms during the anesthetic phase of the injection.  She had a mild increased range of motion  Procedure, treatment alternatives, risks and benefits explained, specific risks discussed. Consent was given by the patient. Immediately prior to procedure a time out was called to verify the correct patient, procedure, equipment, support staff and site/side marked as required. Patient was  prepped and draped in the usual sterile fashion.         Clinical History: No specialty comments available.     Objective:  VS:  HT:    WT:   BMI:     BP:   HR: bpm  TEMP: ( )  RESP:  Physical Exam Musculoskeletal:     Comments: Painful decreased range of motion of the left shoulder.     Imaging: No results found.

## 2021-06-28 ENCOUNTER — Other Ambulatory Visit (HOSPITAL_BASED_OUTPATIENT_CLINIC_OR_DEPARTMENT_OTHER): Payer: Self-pay

## 2021-06-28 ENCOUNTER — Telehealth: Payer: Self-pay | Admitting: Family Medicine

## 2021-06-28 MED ORDER — VALACYCLOVIR HCL 1 G PO TABS
1000.0000 mg | ORAL_TABLET | Freq: Three times a day (TID) | ORAL | 2 refills | Status: DC | PRN
  Filled 2021-06-28: qty 30, 10d supply, fill #0
  Filled 2021-07-08: qty 30, 10d supply, fill #1
  Filled 2022-01-26: qty 30, 10d supply, fill #2

## 2021-06-28 NOTE — Telephone Encounter (Signed)
Refill sent.

## 2021-06-28 NOTE — Telephone Encounter (Signed)
Medication: valACYclovir (VALTREX) 1000 MG tablet  Has the patient contacted their pharmacy? No. (If no, request that the patient contact the pharmacy for the refill.) (If yes, when and what did the pharmacy advise?)  Preferred Pharmacy (with phone number or street name):  [facility], [address]  [place], [place] [address]  Phone:  [phone]  Fax:  [phone]   Agent: Please be advised that RX refills may take up to 3 business days. We ask that you follow-up with your pharmacy.

## 2021-07-08 ENCOUNTER — Other Ambulatory Visit (HOSPITAL_COMMUNITY): Payer: Self-pay

## 2021-07-24 ENCOUNTER — Other Ambulatory Visit (HOSPITAL_BASED_OUTPATIENT_CLINIC_OR_DEPARTMENT_OTHER): Payer: Self-pay

## 2021-07-24 ENCOUNTER — Other Ambulatory Visit: Payer: Self-pay | Admitting: Family Medicine

## 2021-07-24 DIAGNOSIS — J302 Other seasonal allergic rhinitis: Secondary | ICD-10-CM

## 2021-07-24 MED ORDER — FLUTICASONE PROPIONATE 50 MCG/ACT NA SUSP
2.0000 | Freq: Every day | NASAL | 5 refills | Status: DC
  Filled 2021-07-24: qty 16, 30d supply, fill #0
  Filled 2022-01-03: qty 16, 30d supply, fill #1
  Filled 2022-01-26: qty 16, 30d supply, fill #2
  Filled 2022-05-16: qty 16, 30d supply, fill #3

## 2021-07-24 MED ORDER — RYBELSUS 7 MG PO TABS
7.0000 mg | ORAL_TABLET | Freq: Every day | ORAL | 1 refills | Status: DC
  Filled 2021-07-24: qty 90, 90d supply, fill #0

## 2021-07-25 ENCOUNTER — Telehealth: Payer: Self-pay | Admitting: Family Medicine

## 2021-07-25 ENCOUNTER — Telehealth: Payer: Self-pay

## 2021-07-25 ENCOUNTER — Other Ambulatory Visit (HOSPITAL_BASED_OUTPATIENT_CLINIC_OR_DEPARTMENT_OTHER): Payer: Self-pay

## 2021-07-25 NOTE — Telephone Encounter (Signed)
Pt started and sent to plan.  Key: [id number]

## 2021-07-25 NOTE — Telephone Encounter (Signed)
PA has been started

## 2021-07-25 NOTE — Telephone Encounter (Signed)
Pt stated pharmacy faxed over pre authorization for medication, pt is completely out and need this medication immediately.   Semaglutide (Rybelsus) 7 MG tabs  [facility]  [address], [place], [place] [address]  Phone:  [phone]  Fax:  [phone]

## 2021-07-26 ENCOUNTER — Other Ambulatory Visit (HOSPITAL_COMMUNITY): Payer: Self-pay

## 2021-07-26 ENCOUNTER — Other Ambulatory Visit: Payer: Self-pay | Admitting: Family Medicine

## 2021-07-26 ENCOUNTER — Other Ambulatory Visit (HOSPITAL_BASED_OUTPATIENT_CLINIC_OR_DEPARTMENT_OTHER): Payer: Self-pay

## 2021-07-26 DIAGNOSIS — E559 Vitamin D deficiency, unspecified: Secondary | ICD-10-CM

## 2021-07-26 NOTE — Telephone Encounter (Signed)
The denial was based on our criteria for Rybelsus Tab 7mg . Per your health plan's criteria, this drug is covered if you meet the following: The plan allows [place] to cover a drug only when it is used for a "medically accepted indication." A medically accepted indication means the use is approved by the Food and Drug Administration (FDA) or one of the following medical resources: (1) Two articles from major peer-reviewed medical journals. (2) [employer] [place]. (3) DRUGDEX System by [employer]. RYBELSUS TAB 7MG  is not FDA approved or supported by an accepted medical resource for the treatment of your medical condition(s): A condition where your body doesn't use insulin effectively, meaning your cells may have trouble absorbing glucose, which may cause high blood sugar (Insulin resistance). The information provided does not show that you meet the criteria listed above.

## 2021-07-27 ENCOUNTER — Other Ambulatory Visit (HOSPITAL_BASED_OUTPATIENT_CLINIC_OR_DEPARTMENT_OTHER): Payer: Self-pay

## 2021-07-27 ENCOUNTER — Other Ambulatory Visit: Payer: Self-pay | Admitting: Family Medicine

## 2021-07-27 DIAGNOSIS — R7303 Prediabetes: Secondary | ICD-10-CM

## 2021-07-27 MED ORDER — TIRZEPATIDE 2.5 MG/0.5ML ~~LOC~~ SOAJ
2.5000 mg | SUBCUTANEOUS | 0 refills | Status: DC
Start: 2021-07-27 — End: 2021-10-23
  Filled 2021-07-27 – 2021-08-10 (×3): qty 2, 28d supply, fill #0

## 2021-07-27 NOTE — Telephone Encounter (Signed)
Insulin resistance was used

## 2021-07-28 ENCOUNTER — Telehealth: Payer: Self-pay

## 2021-07-28 NOTE — Telephone Encounter (Signed)
PA started

## 2021-07-28 NOTE — Telephone Encounter (Signed)
PA sent   Key: [id number]

## 2021-07-31 ENCOUNTER — Other Ambulatory Visit (HOSPITAL_BASED_OUTPATIENT_CLINIC_OR_DEPARTMENT_OTHER): Payer: Self-pay

## 2021-07-31 ENCOUNTER — Encounter: Payer: Self-pay | Admitting: Family Medicine

## 2021-08-01 ENCOUNTER — Other Ambulatory Visit (HOSPITAL_BASED_OUTPATIENT_CLINIC_OR_DEPARTMENT_OTHER): Payer: Self-pay

## 2021-08-01 ENCOUNTER — Other Ambulatory Visit: Payer: Self-pay | Admitting: Family Medicine

## 2021-08-01 MED ORDER — ALIGN 4 MG PO CAPS
1.0000 | ORAL_CAPSULE | Freq: Every day | ORAL | 3 refills | Status: AC
  Filled 2021-08-01: qty 84, 84d supply, fill #0
  Filled 2022-01-26 – 2022-05-16 (×2): qty 120, 120d supply, fill #0

## 2021-08-02 ENCOUNTER — Other Ambulatory Visit (HOSPITAL_BASED_OUTPATIENT_CLINIC_OR_DEPARTMENT_OTHER): Payer: Self-pay

## 2021-08-02 ENCOUNTER — Ambulatory Visit (INDEPENDENT_AMBULATORY_CARE_PROVIDER_SITE_OTHER): Payer: No Typology Code available for payment source | Admitting: Orthopedic Surgery

## 2021-08-02 ENCOUNTER — Ambulatory Visit (INDEPENDENT_AMBULATORY_CARE_PROVIDER_SITE_OTHER): Payer: No Typology Code available for payment source

## 2021-08-02 DIAGNOSIS — M79604 Pain in right leg: Secondary | ICD-10-CM

## 2021-08-02 NOTE — Telephone Encounter (Signed)
DENIED  Emily Osborne is not FDA approved or supported by an accepted medical resource for the treatment of your medical condition(s): A condition where your blood glucose levels are higher than normal, but not high enough for you to have diabetes (prediabetes).

## 2021-08-03 ENCOUNTER — Other Ambulatory Visit (HOSPITAL_BASED_OUTPATIENT_CLINIC_OR_DEPARTMENT_OTHER): Payer: Self-pay

## 2021-08-04 ENCOUNTER — Other Ambulatory Visit (HOSPITAL_COMMUNITY): Payer: Self-pay

## 2021-08-06 ENCOUNTER — Encounter: Payer: Self-pay | Admitting: Orthopedic Surgery

## 2021-08-06 NOTE — Progress Notes (Signed)
Office Visit Note   Patient: Emily Osborne           Date of Birth: 26-Jul-1961           MRN: [id number] Visit Date: 08/02/2021 Requested by: Carollee Herter, [place], [place] [address] [place],  [place] [address] PCP: [name], DO  Subjective: Chief Complaint  Patient presents with   Right Leg - Pain    HPI: Emily Osborne is a 60 year old patient with right leg pain and weakness with hip flexion which has been going on since a fall in October.  She reports decreased strength and inability to lift her leg up particularly when she is getting into the car.  Pain radiates down to the knee but no numbness and tingling.  She states that her leg feels heavy.  She has been doing physical therapy on the left arm with [facility] physical therapy.  She had an MRI scan of the lumbar spine in 2013 which is unremarkable.  She can walk okay and is not hard to walk and she does not have really any limitation of walking endurance but hip flexion is difficult when she is trying to lift the leg up such as going up and down stairs and trying to get into and out of vehicles.  She denies any low back pain.  She has been in therapy to try to improve the strength in her leg without any help.              ROS: All systems reviewed are negative as they relate to the chief complaint within the history of present illness.  Patient denies  fevers or chills.   Assessment & Plan: Visit Diagnoses:  1. Pain in right leg     Plan: Impression is right leg pain with hip flexor weakness.  Radiographs of the femur unremarkable.  Not very much hip arthritis present.  Lesser trochanter peers intact.  I think this could represent iliopsoas tendon evulsion or could be back related such as a far lateral disc herniation without any back pain which is giving her weakness.  She does not have a Trendelenburg gait and no real groin pain with internal ex rotation of the leg.  Hip flexor weakness is present but that does  not really extend to hamstring and quad strength or ankle dorsiflexion strength.  Plan MRI of the right hip to evaluate this hip flexor weakness.  Follow-up after that study.  Follow-Up Instructions: Return for after MRI.   Orders:  Orders Placed This Encounter  Procedures   XR FEMUR, MIN 2 VIEWS RIGHT   MR Hip Right w/o contrast   No orders of the defined types were placed in this encounter.     Procedures: No procedures performed   Clinical Data: No additional findings.  Objective: Vital Signs: There were no vitals taken for this visit.  Physical Exam:   Constitutional: Patient appears well-developed HEENT:  Head: Normocephalic Eyes:EOM are normal Neck: Normal range of motion Cardiovascular: Normal rate Pulmonary/chest: Effort normal Neurologic: Patient is alert Skin: Skin is warm Psychiatric: Patient has normal mood and affect   Ortho Exam: Ortho exam demonstrates normal gait alignment.  She does have hip flexor weakness at 4-5 on the right compared to 5+ out of 5 on the left.  Dorsiflexion plantarflexion quad hamstring strength 5+ out of 5 bilaterally.  No definite paresthesias L1 S1 bilaterally.  No groin pain on the right left-hand side with internal  or external rotation of the leg.  Pedal pulses palpable.  No nerve root tension signs.  Specialty Comments:  No specialty comments available.  Imaging: No results found.   PMFS History: Patient Active Problem List   Diagnosis Date Noted   Swelling 02/02/2021   Right lower quadrant abdominal pain 02/02/2021   History of COVID-19 10/17/2020   Shortness of breath 10/17/2020   COVID-19 10/12/2020   Dizziness 05/23/2020   Essential hypertension 06/11/2019   New daily persistent headache 06/11/2019   Memory loss 06/11/2019   Upper airway cough syndrome vs cough variant asthma 01/01/2019   Chest pain 12/09/2018   Prediabetes 10/07/2018   Preventative health care 08/07/2018   Anxiety 10/11/2017   S/P partial  colectomy 06/18/2017   Multiple drug allergies 06/18/2017   Colitis 02/05/2017   Diarrhea 02/05/2017   Diverticular disease 08/67/6195   Helicobacter pylori (H. pylori) infection 12/13/2016   Routine screening for STI (sexually transmitted infection) 12/13/2016   Clostridium difficile colitis 12/13/2016   Diverticulitis 10/10/2016   Lower abdominal pain 09/29/2016   Palpitations 09/32/6712   Mild diastolic dysfunction 45/80/9983   Graves disease 01/02/2016   Acute diverticulitis 12/07/2015   Acute bacterial sinusitis 10/04/2015   History of colonic polyps 11/15/2014   Left shoulder pain 09/28/2013   Left-sided weakness 09/16/2013   Obesity (BMI 30-39.9) with low erv on pfts 09/25/19 09/02/2013   Myalgia 06/21/2011   POSTMENOPAUSAL STATUS 09/07/2010   PAIN IN JOINT, MULTIPLE SITES 12/27/2009   LOW BACK PAIN, CHRONIC 12/27/2009   FATIGUE 12/27/2009   VAGINITIS 11/22/2009   DYSPEPSIA&OTHER SPEC DISORDERS FUNCTION STOMACH 09/01/2009   NAUSEA 08/03/2009   FLATULENCE-GAS-BLOATING 08/03/2009   ABDOMINAL PAIN RIGHT UPPER QUADRANT 07/06/2009   SUPRAPUBIC PAIN 07/06/2009   GANGLION CYST, HX OF 07/06/2009   BACK PAIN 05/04/2009   Hyperlipidemia 09/16/2008   UNSPECIFIED MYALGIA AND MYOSITIS 09/16/2008   Precordial pain 07/27/2008   GERD 38/25/0539   HELICOBACTER PYLORI GASTRITIS, HX OF 04/05/2008   OTHER ACUTE REACTIONS TO STRESS 03/16/2008   ABDOMINAL PAIN, EPIGASTRIC 03/16/2008   SHOULDER PAIN, RIGHT 10/01/2007   ELBOW PAIN, RIGHT 10/01/2007   Pain in limb 04/14/2007   DEPENDENT EDEMA, RIGHT LEG 04/14/2007   SHINGLES 04/08/2007   Asthma 03/18/2007   GANGLION CYST, WRIST, RIGHT 03/18/2007   BUNIONECTOMY, HX OF 03/18/2007   Past Medical History:  Diagnosis Date   Allergy    Anxiety    Asthma    pt has inhaler   Back pain    C. difficile colitis    Cataract    bil eyes   Chest pain    Chronic fatigue syndrome    Clostridium difficile colitis 12/13/2016   Diverticulitis     Dyspnea    Dysrhythmia    palpitations   Edema    Elevated blood pressure reading without diagnosis of hypertension    "just elevated when I'm in pain" (12/07/2015)   Fatty liver    Fibromyalgia    Ganglion of joint    right wrist   GERD (gastroesophageal reflux disease)    H. pylori infection    Headache    hx of   Helicobacter pylori (H. pylori) infection 12/13/2016   Herpes zoster without mention of complication    HTN (hypertension)    Hyperlipidemia    Hyperthyroidism    IBS (irritable bowel syndrome)    Leg edema    Multiple drug allergies 06/18/2017   Myalgia    Nontraumatic rupture of Achilles tendon  Other acute reactions to stress    Pain in joint, shoulder region    Pain in joint, upper arm    Pain in limb    Palpitations    Personal history of other diseases of digestive system    Pneumonia    once   PONV (postoperative nausea and vomiting)    Routine screening for STI (sexually transmitted infection) 12/13/2016   S/P partial colectomy 06/18/2017   Thyroid disease    hyperthyroidism   Type 2 diabetes mellitus with hyperglycemia, without long-term current use of insulin ([place]) 08/07/2018   Vitamin B 12 deficiency     Family History  Problem Relation Age of Onset   Hypertension Father    Diabetes Father    Heart disease Father    Kidney disease Father        Died, 33   Hyperlipidemia Father    Leukemia Brother    Cancer Brother        myleoblastic anemia   Hyperlipidemia Mother    Hypertension Mother        Died, 52   Thyroid disease Mother        Thyroid surgery   Obesity Mother    Heart disease Mother    Pernicious anemia Sister    Thyroid disease Sister        On thyroid Rx   Lupus Daughter    Coronary artery disease Brother    Hypertension Brother    Colon cancer Neg Hx    Esophageal cancer Neg Hx    Rectal cancer Neg Hx    Stomach cancer Neg Hx    Pancreatic cancer Neg Hx    Prostate cancer Neg Hx    Colon polyps Neg Hx     Past  Surgical History:  Procedure Laterality Date   ABDOMINAL HYSTERECTOMY     ACHILLES TENDON REPAIR Right 2005   BUNIONECTOMY Bilateral    Bunionectomy 1983   COLON SURGERY  01/23/2017   6 to 8 inches sigmoid colon removed   COLONOSCOPY     GANGLION CYST EXCISION Right    wrist   LEFT HEART CATH AND CORONARY ANGIOGRAPHY N/A 10/03/2016   Procedure: Left Heart Cath and Coronary Angiography;  Surgeon: Burnell Blanks, MD;  Location: [facility];  Service: Cardiovascular;  Laterality: N/A;   MENISCUS REPAIR Right 04/2014   POLYPECTOMY     TUBAL LIGATION     wisdomteeth extraction     Social History   Occupational History   Occupation: Personnel officer  Tobacco Use   Smoking status: Never   Smokeless tobacco: Never  Vaping Use   Vaping Use: Never used  Substance and Sexual Activity   Alcohol use: No    Alcohol/week: 0.0 standard drinks   Drug use: No   Sexual activity: Not Currently    Partners: Male

## 2021-08-09 ENCOUNTER — Other Ambulatory Visit: Payer: Self-pay | Admitting: Family Medicine

## 2021-08-09 ENCOUNTER — Other Ambulatory Visit (HOSPITAL_BASED_OUTPATIENT_CLINIC_OR_DEPARTMENT_OTHER): Payer: Self-pay

## 2021-08-09 ENCOUNTER — Ambulatory Visit
Admission: RE | Admit: 2021-08-09 | Discharge: 2021-08-09 | Disposition: A | Discharge status: Home / Self Care | Payer: No Typology Code available for payment source | Source: Ambulatory Visit | Attending: Family Medicine | Admitting: Family Medicine

## 2021-08-09 DIAGNOSIS — R921 Mammographic calcification found on diagnostic imaging of breast: Secondary | ICD-10-CM

## 2021-08-10 ENCOUNTER — Other Ambulatory Visit (HOSPITAL_COMMUNITY): Payer: Self-pay

## 2021-08-15 ENCOUNTER — Other Ambulatory Visit (HOSPITAL_BASED_OUTPATIENT_CLINIC_OR_DEPARTMENT_OTHER): Payer: Self-pay

## 2021-08-21 ENCOUNTER — Other Ambulatory Visit (HOSPITAL_COMMUNITY): Payer: Self-pay

## 2021-08-21 MED FILL — Levocetirizine Dihydrochloride Tab 5 MG: ORAL | 90 days supply | Qty: 90 | Fill #2 | Status: AC

## 2021-08-28 ENCOUNTER — Ambulatory Visit
Admission: RE | Admit: 2021-08-28 | Discharge: 2021-08-28 | Disposition: A | Discharge status: Home / Self Care | Payer: No Typology Code available for payment source | Source: Ambulatory Visit | Attending: Orthopedic Surgery | Admitting: Orthopedic Surgery

## 2021-08-28 ENCOUNTER — Other Ambulatory Visit: Payer: Self-pay

## 2021-08-28 DIAGNOSIS — M79604 Pain in right leg: Secondary | ICD-10-CM

## 2021-09-06 ENCOUNTER — Ambulatory Visit (INDEPENDENT_AMBULATORY_CARE_PROVIDER_SITE_OTHER): Payer: No Typology Code available for payment source | Admitting: Orthopedic Surgery

## 2021-09-06 ENCOUNTER — Other Ambulatory Visit: Payer: Self-pay

## 2021-09-06 DIAGNOSIS — M79604 Pain in right leg: Secondary | ICD-10-CM | POA: Diagnosis not present

## 2021-09-10 ENCOUNTER — Encounter: Payer: Self-pay | Admitting: Orthopedic Surgery

## 2021-09-10 ENCOUNTER — Encounter: Payer: Self-pay | Admitting: Family Medicine

## 2021-09-10 NOTE — Progress Notes (Signed)
Office Visit Note   Patient: Emily Osborne           Date of Birth: Jun 09, 1961           MRN: [id number] Visit Date: 09/06/2021 Requested by: [address] [place], [place] [address] [place],  [place] [address] PCP: Carollee Herter, Alferd Apa, DO  Subjective: Chief Complaint  Patient presents with   Other     Scan review    HPI: Emily Osborne is a 61 year old patient with right hip flexor weakness.  Not having too much low back pain.  She has not been limping.  Overall she says she is slightly improved compared to last clinic visit.  MRI of the right hip is reviewed.  In general there is mild arthritis with no hip effusion and mild insertional tearing of the left-sided gluteus minimus and medius tendons.  Nothing really going on on the right-hand side.  She states that her strength comes and goes.  She states it has gotten better since the last visit.              ROS: All systems reviewed are negative as they relate to the chief complaint within the history of present illness.  Patient denies  fevers or chills.   Assessment & Plan: Visit Diagnoses:  1. Pain in right leg     Plan: Impression is right hip flexor weakness with no structural problem of the abductor's or hip flexors around the proximal femur.  I think this is likely coming from her back even though she does not have much back pain.  It has improved.  Plan is observation for now.  With recurrent weakness we could consider further work-up and/or intervention on the back.  Follow-up as needed.  Follow-Up Instructions: Return if symptoms worsen or fail to improve.   Orders:  No orders of the defined types were placed in this encounter.  No orders of the defined types were placed in this encounter.     Procedures: No procedures performed   Clinical Data: No additional findings.  Objective: Vital Signs: There were no vitals taken for this visit.  Physical Exam:   Constitutional: Patient appears  well-developed HEENT:  Head: Normocephalic Eyes:EOM are normal Neck: Normal range of motion Cardiovascular: Normal rate Pulmonary/chest: Effort normal Neurologic: Patient is alert Skin: Skin is warm Psychiatric: Patient has normal mood and affect   Ortho Exam: Ortho exam demonstrates improved strength today and hip flexors at 4- out of 5 on the right compared to 5+ out of 5 on the left.  Abduction adduction quad and hamstring strength 5+ out of 5 bilaterally.  No definite paresthesias L1 S1 bilaterally and no groin pain with internal or external rotation of the right hip.  Specialty Comments:  No specialty comments available.  Imaging: No results found.   PMFS History: Patient Active Problem List   Diagnosis Date Noted   Swelling 02/02/2021   Right lower quadrant abdominal pain 02/02/2021   History of COVID-19 10/17/2020   Shortness of breath 10/17/2020   COVID-19 10/12/2020   Dizziness 05/23/2020   Essential hypertension 06/11/2019   New daily persistent headache 06/11/2019   Memory loss 06/11/2019   Upper airway cough syndrome vs cough variant asthma 01/01/2019   Chest pain 12/09/2018   Prediabetes 10/07/2018   Preventative health care 08/07/2018   Anxiety 10/11/2017   S/P partial colectomy 06/18/2017   Multiple drug allergies 06/18/2017   Colitis 02/05/2017   Diarrhea 02/05/2017  Diverticular disease 29/51/8841   Helicobacter pylori (H. pylori) infection 12/13/2016   Routine screening for STI (sexually transmitted infection) 12/13/2016   Clostridium difficile colitis 12/13/2016   Diverticulitis 10/10/2016   Lower abdominal pain 09/29/2016   Palpitations 66/01/3015   Mild diastolic dysfunction 08/28/3233   Graves disease 01/02/2016   Acute diverticulitis 12/07/2015   Acute bacterial sinusitis 10/04/2015   History of colonic polyps 11/15/2014   Left shoulder pain 09/28/2013   Left-sided weakness 09/16/2013   Obesity (BMI 30-39.9) with low erv on pfts 09/25/19  09/02/2013   Myalgia 06/21/2011   POSTMENOPAUSAL STATUS 09/07/2010   PAIN IN JOINT, MULTIPLE SITES 12/27/2009   LOW BACK PAIN, CHRONIC 12/27/2009   FATIGUE 12/27/2009   VAGINITIS 11/22/2009   DYSPEPSIA&OTHER SPEC DISORDERS FUNCTION STOMACH 09/01/2009   NAUSEA 08/03/2009   FLATULENCE-GAS-BLOATING 08/03/2009   ABDOMINAL PAIN RIGHT UPPER QUADRANT 07/06/2009   SUPRAPUBIC PAIN 07/06/2009   GANGLION CYST, HX OF 07/06/2009   BACK PAIN 05/04/2009   Hyperlipidemia 09/16/2008   UNSPECIFIED MYALGIA AND MYOSITIS 09/16/2008   Precordial pain 07/27/2008   GERD 57/32/2025   HELICOBACTER PYLORI GASTRITIS, HX OF 04/05/2008   OTHER ACUTE REACTIONS TO STRESS 03/16/2008   ABDOMINAL PAIN, EPIGASTRIC 03/16/2008   SHOULDER PAIN, RIGHT 10/01/2007   ELBOW PAIN, RIGHT 10/01/2007   Pain in limb 04/14/2007   DEPENDENT EDEMA, RIGHT LEG 04/14/2007   SHINGLES 04/08/2007   Asthma 03/18/2007   GANGLION CYST, WRIST, RIGHT 03/18/2007   BUNIONECTOMY, HX OF 03/18/2007   Past Medical History:  Diagnosis Date   Allergy    Anxiety    Asthma    pt has inhaler   Back pain    C. difficile colitis    Cataract    bil eyes   Chest pain    Chronic fatigue syndrome    Clostridium difficile colitis 12/13/2016   Diverticulitis    Dyspnea    Dysrhythmia    palpitations   Edema    Elevated blood pressure reading without diagnosis of hypertension    "just elevated when I'm in pain" (12/07/2015)   Fatty liver    Fibromyalgia    Ganglion of joint    right wrist   GERD (gastroesophageal reflux disease)    H. pylori infection    Headache    hx of   Helicobacter pylori (H. pylori) infection 12/13/2016   Herpes zoster without mention of complication    HTN (hypertension)    Hyperlipidemia    Hyperthyroidism    IBS (irritable bowel syndrome)    Leg edema    Multiple drug allergies 06/18/2017   Myalgia    Nontraumatic rupture of Achilles tendon    Other acute reactions to stress    Pain in joint, shoulder  region    Pain in joint, upper arm    Pain in limb    Palpitations    Personal history of other diseases of digestive system    Pneumonia    once   PONV (postoperative nausea and vomiting)    Routine screening for STI (sexually transmitted infection) 12/13/2016   S/P partial colectomy 06/18/2017   Thyroid disease    hyperthyroidism   Type 2 diabetes mellitus with hyperglycemia, without long-term current use of insulin ([place]) 08/07/2018   Vitamin B 12 deficiency     Family History  Problem Relation Age of Onset   Hypertension Father    Diabetes Father    Heart disease Father    Kidney disease Father  Died, 15   Hyperlipidemia Father    Leukemia Brother    Cancer Brother        myleoblastic anemia   Hyperlipidemia Mother    Hypertension Mother        Died, 64   Thyroid disease Mother        Thyroid surgery   Obesity Mother    Heart disease Mother    Pernicious anemia Sister    Thyroid disease Sister        On thyroid Rx   Lupus Daughter    Coronary artery disease Brother    Hypertension Brother    Colon cancer Neg Hx    Esophageal cancer Neg Hx    Rectal cancer Neg Hx    Stomach cancer Neg Hx    Pancreatic cancer Neg Hx    Prostate cancer Neg Hx    Colon polyps Neg Hx     Past Surgical History:  Procedure Laterality Date   ABDOMINAL HYSTERECTOMY     ACHILLES TENDON REPAIR Right 2005   BUNIONECTOMY Bilateral    Bunionectomy 1983   COLON SURGERY  01/23/2017   6 to 8 inches sigmoid colon removed   COLONOSCOPY     GANGLION CYST EXCISION Right    wrist   LEFT HEART CATH AND CORONARY ANGIOGRAPHY N/A 10/03/2016   Procedure: Left Heart Cath and Coronary Angiography;  Surgeon: Burnell Blanks, MD;  Location: [facility];  Service: Cardiovascular;  Laterality: N/A;   MENISCUS REPAIR Right 04/2014   POLYPECTOMY     TUBAL LIGATION     wisdomteeth extraction     Social History   Occupational History   Occupation: Personnel officer  Tobacco Use    Smoking status: Never   Smokeless tobacco: Never  Vaping Use   Vaping Use: Never used  Substance and Sexual Activity   Alcohol use: No    Alcohol/week: 0.0 standard drinks   Drug use: No   Sexual activity: Not Currently    Partners: Male

## 2021-09-11 ENCOUNTER — Other Ambulatory Visit (HOSPITAL_BASED_OUTPATIENT_CLINIC_OR_DEPARTMENT_OTHER): Payer: Self-pay

## 2021-09-11 MED ORDER — FREESTYLE LITE TEST VI STRP
ORAL_STRIP | 1 refills | Status: DC
  Filled 2021-09-11: qty 50, 50d supply, fill #0
  Filled 2021-09-18 – 2021-10-04 (×2): qty 100, 90d supply, fill #0
  Filled 2022-05-16: qty 100, 90d supply, fill #1

## 2021-09-18 ENCOUNTER — Other Ambulatory Visit (HOSPITAL_BASED_OUTPATIENT_CLINIC_OR_DEPARTMENT_OTHER): Payer: Self-pay

## 2021-09-25 ENCOUNTER — Other Ambulatory Visit (HOSPITAL_BASED_OUTPATIENT_CLINIC_OR_DEPARTMENT_OTHER): Payer: Self-pay

## 2021-10-04 ENCOUNTER — Other Ambulatory Visit (HOSPITAL_BASED_OUTPATIENT_CLINIC_OR_DEPARTMENT_OTHER): Payer: Self-pay

## 2021-10-17 ENCOUNTER — Other Ambulatory Visit (HOSPITAL_BASED_OUTPATIENT_CLINIC_OR_DEPARTMENT_OTHER): Payer: Self-pay

## 2021-10-17 ENCOUNTER — Telehealth: Payer: Self-pay | Admitting: Gastroenterology

## 2021-10-17 MED ORDER — PANTOPRAZOLE SODIUM 40 MG PO TBEC
40.0000 mg | DELAYED_RELEASE_TABLET | Freq: Two times a day (BID) | ORAL | 2 refills | Status: DC
  Filled 2021-10-17: qty 60, 30d supply, fill #0

## 2021-10-17 NOTE — Telephone Encounter (Signed)
Called and spoke with patient regarding recommendations. She states that she does not remember trying Protonix but is willing . She knows that this is BID prescription, she has been advised to take 30-60 minutes before meals. Pt requested that RX be sent to [facility]. Pt will keep f/u appt as scheduled. Pt verbalized understanding and had no concerns at the end of the call.

## 2021-10-17 NOTE — Telephone Encounter (Signed)
Returned call to patient. She states that she changed insurances due to her job and Dexilant is not covered. Pt states that her symptoms were well controlled on Dexilant but she has been out of it for a week. Pt states that is just now getting over Norovirus as well and is not sure if that is why she is experiencing fullness in her upper abdomen and nausea. Patient reports that she has been taking Pepcid 20 mg BID with little relief. She states that omeprazole, Nexium, prevacid, etc did not work for her. Dexilant is the only thing that worked, she was also taking Pepcid 20 mg at bedtime while taking Dexilant. She also reported that her stools have been a "pale" color, she is not sure if it just her getting over the virus. Her f/u appt has been rescheduled to Monday, 10/23/21 at 8:10 am. Pt knows that she should arrive by 8 am. She is aware that the 4/3 appt has been cancelled. Any recommendations in the interim? Please advise, thanks.

## 2021-10-17 NOTE — Telephone Encounter (Signed)
She has been on Dexilant for a long time. Sorry to hear it is not covered, would switch to another PPI at BID dosing to see if this will work for her. Can try Protonix 40mg  BID if she has not been on that in the past. If she has any preference of other PPIs she has been on in the past otherwise, whichever her preference is fine. Thanks

## 2021-10-17 NOTE — Telephone Encounter (Signed)
Patient called this morning.  Due to an insurance change, her Dexilant is no longer covered and she's been out about a week.  No other med seems to work for her.  She is questioning whether we have samples or can give her suggestions about something else she can take.  She has an appointment scheduled with Dr. Havery Moros on 4/3 but needs help between now and then.  Please call patient and advise.  Thank you.

## 2021-10-18 ENCOUNTER — Telehealth: Payer: Self-pay

## 2021-10-18 ENCOUNTER — Other Ambulatory Visit (HOSPITAL_BASED_OUTPATIENT_CLINIC_OR_DEPARTMENT_OTHER): Payer: Self-pay

## 2021-10-18 NOTE — Telephone Encounter (Signed)
Key: [id number] - PA Case ID: [id number] - Rx #: [id number] Pantoprazole 40 mg BID ?

## 2021-10-19 ENCOUNTER — Ambulatory Visit: Payer: No Typology Code available for payment source | Attending: Internal Medicine

## 2021-10-19 DIAGNOSIS — Z23 Encounter for immunization: Secondary | ICD-10-CM

## 2021-10-19 NOTE — Telephone Encounter (Signed)
Approved.  Through 3---2024 ?

## 2021-10-19 NOTE — Progress Notes (Signed)
? ?  Covid-19 Vaccination Clinic ? ?Name:  Emily Osborne    ?MRN: [id number] ?DOB: 1961-04-05 ? ?10/19/2021 ? ?[name] was observed post Covid-19 immunization for 15 minutes without incident. She was provided with Vaccine Information Sheet and instruction to access the V-Safe system.  ? ?[name] was instructed to call 911 with any severe reactions post vaccine: ?Difficulty breathing  ?Swelling of face and throat  ?A fast heartbeat  ?A bad rash all over body  ?Dizziness and weakness  ? ?Immunizations Administered   ? ? Name Date Dose VIS Date Route  ? Ambulance person Booster 10/19/2021  9:16 AM 0.3 mL 04/19/2021 Intramuscular  ? Manufacturer: [place]: [phone]  ? [place]: [phone]  ? ?  ? ? ?

## 2021-10-20 ENCOUNTER — Other Ambulatory Visit (HOSPITAL_BASED_OUTPATIENT_CLINIC_OR_DEPARTMENT_OTHER): Payer: Self-pay

## 2021-10-20 MED ORDER — PFIZER COVID-19 VAC BIVALENT 30 MCG/0.3ML IM SUSP
INTRAMUSCULAR | 0 refills | Status: DC
  Filled 2021-10-20: qty 0.3, 1d supply, fill #0

## 2021-10-23 ENCOUNTER — Ambulatory Visit: Payer: No Typology Code available for payment source | Admitting: Gastroenterology

## 2021-10-23 ENCOUNTER — Other Ambulatory Visit (HOSPITAL_COMMUNITY): Payer: Self-pay

## 2021-10-23 ENCOUNTER — Encounter: Payer: Self-pay | Admitting: Gastroenterology

## 2021-10-23 VITALS — BP 114/78 | HR 84 | Ht 64.75 in | Wt 242.1 lb

## 2021-10-23 DIAGNOSIS — Z79899 Other long term (current) drug therapy: Secondary | ICD-10-CM

## 2021-10-23 DIAGNOSIS — R1013 Epigastric pain: Secondary | ICD-10-CM | POA: Diagnosis not present

## 2021-10-23 DIAGNOSIS — K760 Fatty (change of) liver, not elsewhere classified: Secondary | ICD-10-CM

## 2021-10-23 DIAGNOSIS — K219 Gastro-esophageal reflux disease without esophagitis: Secondary | ICD-10-CM

## 2021-10-23 DIAGNOSIS — K529 Noninfective gastroenteritis and colitis, unspecified: Secondary | ICD-10-CM

## 2021-10-23 MED ORDER — SUCRALFATE 1 G PO TABS
1.0000 g | ORAL_TABLET | Freq: Four times a day (QID) | ORAL | 3 refills | Status: DC | PRN
  Filled 2021-10-23: qty 60, 15d supply, fill #0

## 2021-10-23 NOTE — Progress Notes (Signed)
HPI :  61 year old female here for a follow-up visit.  I have seen her in the past for GERD, refractory H. pylori, history of C. difficile, food intolerance.  Her main complaint is problems with reflux and epigastric discomfort after she was taken off her Dexilant.  Recall she has been on a variety of PPIs for her symptoms in the past to include Nexium, omeprazole, Prevacid, Protonix, and Pepcid.  She had been on Dexilant 60 mg daily for some time which controls her symptoms pretty well.  At some point time we reduced her to 30 mg once daily and that did not work as well.  She had a change in her insurance in recent months and they stopped her [place].  I transitioned her to Protonix 40 mg twice daily but she states its not working well at all.  She feels nauseated, has a heaviness and discomfort in her epigastric area after eating which tends to be her baseline symptoms that bother her.  She denies any history of osteoporosis, or CKD.  She does have a B12 deficiency on B12.  She otherwise had acute change in bowel habits about 2 weeks ago.  Had onset of diarrhea and vomiting suddenly.  She states her grandchild was sick and then other people in her house got sick.  This lasted a few days.  Her stool color changed and had "pale" stools without blood.  Over time her stool appears to be getting back to normal color and form.  She is doing better in this regard.  Recall she also has a history of fatty liver.  She has been seen by the weight loss clinic and having difficulty losing weight.  She does not use any alcohol.  BMI currently around 40.  She does drink anywhere from 1 cup to 4 cups of coffee a day.  She had a mild ALT elevation to high 30s low 40s back in 2021, her ALT has been within normal range over the past year.    Endoscopic history: EGD 04/2008 - normal esophagus, H pylori gastriits, normal duodenum Colonoscopy in 2012 with a 31m descending colon polyp removed but not  retrieved. Colonoscopy  09/30/2015 - seven small colon polyps - adenomas, sessile serrated, recall in 3 years, pancolonic diverticulosis, and hemorrhoids EGD 05/17/16 - normal esophagus, benign gastric polyps, gastritis, normal duodenum. Biopsies show no celiac, gastric polyps benign, H pylori gastritis noted EGD 06/04/2017 - gastritis, biposies taken, prominent ampulla - 2cm HH, otherwise normal esophagus - H pylori attempted to be cultured and could not. She followed up with ID   Colonoscopy 10/16/18 - One 3 mm polyp in the sigmoid colon, removed with a cold snare. Resected and retrieved. - One diminutive polyp at the recto-sigmoid colon, removed with a cold snare. Resected and retrieved. - One 3 mm polyp in the rectum, removed with a cold snare. Resected and retrieved. - Diverticulosis in the entire examined colon. - End-to-end colo-colonic anastomosis, characterized by healthy appearing mucosa. - The examination was otherwise normal.   EGD 10/16/18 - 1 cm hiatal hernia. - Normal esophagus - Erythematous mucosa in the gastric fundus and gastric body. - Normal stomach otherwise - biopsies taken to rule out H pylori and negative - Normal duodenal bulb and second portion of the duodenum.   1. Surgical [P], gastric antrum and body - MILD CHRONIC GASTRITIS WITHOUT ACTIVITY - NO H. PYLORI OR INTESTINAL METAPLASIA IDENTIFIED - SEE COMMENT 2. Surgical [P], colon, rectosigmoid, sigmoid, rectal, polyp (3) -  HYPERPLASTIC POLYP (1 OF 3 FRAGMENTS) - POLYPOID FRAGMENTS OF COLONIC MUCOSA WITH LYMPHOID AGGREGATE(S)(2 OF 3 FRAGMENTS) - NO HIGH GRADE DYSPLASIA OR MALIGNANCY IDENTIFIED 1. A Warthin-Starry stain is performed to determine the possibility of the presence of Helicobacter pylori. The Warthin-Starry stain is negative for organisms morphologically consistent with Helicobacter pylori.   Repeat colonoscopy in 5 years  Echo 12/30/18 - EF 55-60%     CT abdomen/ pelvis 02/15/21 -  IMPRESSION: Small fat containing periumbilical hernia. Hepatic steatosis. No other abnormality seen in the abdomen or pelvis.      Past Medical History:  Diagnosis Date   Allergy    Anxiety    Asthma    pt has inhaler   Back pain    C. difficile colitis    Cataract    bil eyes   Chest pain    Chronic fatigue syndrome    Clostridium difficile colitis 12/13/2016   Diverticulitis    Dyspnea    Dysrhythmia    palpitations   Edema    Elevated blood pressure reading without diagnosis of hypertension    "just elevated when I'm in pain" (12/07/2015)   Fatty liver    Fibromyalgia    Ganglion of joint    right wrist   GERD (gastroesophageal reflux disease)    H. pylori infection    Headache    hx of   Helicobacter pylori (H. pylori) infection 12/13/2016   Herpes zoster without mention of complication    HTN (hypertension)    Hyperlipidemia    Hyperthyroidism    IBS (irritable bowel syndrome)    Leg edema    Multiple drug allergies 06/18/2017   Myalgia    Nontraumatic rupture of Achilles tendon    Other acute reactions to stress    Pain in joint, shoulder region    Pain in joint, upper arm    Pain in limb    Palpitations    Personal history of other diseases of digestive system    Pneumonia    once   PONV (postoperative nausea and vomiting)    Routine screening for STI (sexually transmitted infection) 12/13/2016   S/P partial colectomy 06/18/2017   Thyroid disease    hyperthyroidism   Type 2 diabetes mellitus with hyperglycemia, without long-term current use of insulin ([place]) 08/07/2018   Vitamin B 12 deficiency      Past Surgical History:  Procedure Laterality Date   ABDOMINAL HYSTERECTOMY     ACHILLES TENDON REPAIR Right 2005   BUNIONECTOMY Bilateral    Bunionectomy 1983   COLON SURGERY  01/23/2017   6 to 8 inches sigmoid colon removed   COLONOSCOPY     GANGLION CYST EXCISION Right    wrist   LEFT HEART CATH AND CORONARY ANGIOGRAPHY N/A 10/03/2016    Procedure: Left Heart Cath and Coronary Angiography;  Surgeon: Burnell Blanks, MD;  Location: [facility];  Service: Cardiovascular;  Laterality: N/A;   MENISCUS REPAIR Right 04/2014   POLYPECTOMY     TUBAL LIGATION     wisdomteeth extraction     Family History  Problem Relation Age of Onset   Hypertension Father    Diabetes Father    Heart disease Father    Kidney disease Father        Died, 61   Hyperlipidemia Father    Leukemia Brother    Cancer Brother        myleoblastic anemia   Hyperlipidemia Mother    Hypertension Mother  Died, 56   Thyroid disease Mother        Thyroid surgery   Obesity Mother    Heart disease Mother    Pernicious anemia Sister    Thyroid disease Sister        On thyroid Rx   Lupus Daughter    Coronary artery disease Brother    Hypertension Brother    Colon cancer Neg Hx    Esophageal cancer Neg Hx    Rectal cancer Neg Hx    Stomach cancer Neg Hx    Pancreatic cancer Neg Hx    Prostate cancer Neg Hx    Colon polyps Neg Hx    Social History   Tobacco Use   Smoking status: Never   Smokeless tobacco: Never  Vaping Use   Vaping Use: Never used  Substance Use Topics   Alcohol use: No    Alcohol/week: 0.0 standard drinks   Drug use: No   Current Outpatient Medications  Medication Sig Dispense Refill   acetaminophen (TYLENOL) 500 MG tablet Take 1,000 mg by mouth every 6 (six) hours as needed for moderate pain.     albuterol (PROAIR HFA) 108 (90 Base) MCG/ACT inhaler USE 2 PUFFS EVERY 4 HOURS AS NEEDED FOR COUGH, WHEEZE OR SHORTNESS OF BREATH. 8.5 each 2   ALPRAZolam (XANAX) 0.25 MG tablet TAKE 1 TABLET BY MOUTH THREE TIMES A DAY AS NEEDED 30 tablet 2   AMBULATORY NON FORMULARY MEDICATION Medication Name: Critic-Aid Dentist  Apply to affected area as needed 30 g 11   azelastine (OPTIVAR) 0.05 % ophthalmic solution Place 1 drop into both eyes 2 (two) times daily as  needed.     Blood Glucose Monitoring Suppl (FREESTYLE FREEDOM) KIT 1 Device by Does not apply route daily. 1 each 0   budesonide-formoterol (SYMBICORT) 80-4.5 MCG/ACT inhaler INHALE 2 PUFFS BY MOUTH FIRST THING IN THE MORNING AND THEN ANOTHER 2 PUFFS ABOUT 12 HOURS LATER 10.2 g 4   dexlansoprazole (DEXILANT) 60 MG capsule Take 1 capsule (60 mg total) by mouth daily. 90 capsule 3   diclofenac sodium (VOLTAREN) 1 % GEL Apply 2 g topically 4 (four) times daily. Rub into affected area of foot 2 to 4 times daily 100 g 2   famotidine (PEPCID) 20 MG tablet One after supper 30 tablet 11   fluticasone (FLONASE) 50 MCG/ACT nasal spray USE 2 SPRAYS INTO BOTH NOSTRILS DAILY. 16 g 5   glucose blood (FREESTYLE LITE) test strip USE TO CHECK BLOOD SUGAR ONCE A DAY 100 each 1   hydrochlorothiazide (HYDRODIURIL) 25 MG tablet Take 1 tablet (25 mg total) by mouth daily. 90 tablet 3   Iron Combinations (I.L.X. B-12) ELIX Take 1 each by mouth daily as needed (energy). 1 droplet full     levocetirizine (XYZAL) 5 MG tablet TAKE 1 TABLET BY MOUTH EVERY EVENING. 90 tablet 3   lidocaine (LIDODERM) 5 % Apply to affected area QD PRN for a Week 30 patch 0   pantoprazole (PROTONIX) 40 MG tablet Take 1 tablet (40 mg total) by mouth 2 (two) times daily before a meal. (Patient taking differently: Take 40 mg by mouth daily.) 60 tablet 2   Probiotic Product (ALIGN) 4 MG CAPS Take 1 capsule (4 mg total) by mouth daily. 90 capsule 3   valACYclovir (VALTREX) 1000 MG tablet Take 1 tablet (1,000 mg total) by mouth 3 (three) times daily as needed. 30 tablet 2   No current facility-administered medications  for this visit.   Allergies  Allergen Reactions   Aspirin Other (See Comments)    REACTION: anaphylaxis   Gentamicin Other (See Comments)    Eye drops turned the sclera bright red   Ibuprofen Other (See Comments)    REACTION: anaphylaxsis   Metronidazole Swelling    REACTION: red face/swelling   Nsaids Other (See Comments)     REACTION: anaphylaxis   Doxycycline Other (See Comments)    REACTION: severe nausea/vomiting   Influenza A (H1n1) Monoval Vac Other (See Comments)    REACTION: sick for 3 weeks   Loratadine Other (See Comments)    Fatigue/weakness   Metformin And Related Other (See Comments)    Memory issues and headache   Periactin [Cyproheptadine] Other (See Comments)    paranoid    Ranitidine Hcl Other (See Comments)    REACTION: Lips turned red /peel   Sulfamethoxazole-Trimethoprim Other (See Comments)    REACTION: face red/peel   Tramadol Hcl Other (See Comments)    REACTION: paranoid     Review of Systems: All systems reviewed and negative except where noted in HPI.   Lab Results  Component Value Date   WBC 4.6 02/02/2021   HGB 13.2 02/02/2021   HCT 40.1 02/02/2021   MCV 88.4 02/02/2021   PLT 240.0 02/02/2021    Lab Results  Component Value Date   CREATININE 0.93 02/02/2021   BUN 16 02/02/2021   NA 141 02/02/2021   K 4.0 02/02/2021   CL 106 02/02/2021   CO2 30 02/02/2021    Lab Results  Component Value Date   ALT 33 02/02/2021   AST 26 02/02/2021   ALKPHOS 69 02/02/2021   BILITOT 0.4 02/02/2021     Physical Exam: BP 114/78 (BP Location: Left Arm, Patient Position: Sitting, Cuff Size: Normal)    Pulse 84    Ht 5' 4.75" (1.645 m) Comment: height measured without shoes   Wt 242 lb 2 oz (109.8 kg)    BMI 40.60 kg/m  Constitutional: Pleasant,well-developed, female in no acute distress. Neurological: Alert and oriented to person place and time. Psychiatric: Normal mood and affect. Behavior is normal.   ASSESSMENT AND PLAN: 61 year old female who for reassessment the following:  GERD Dyspepsia Long-term use of PPI Fatty liver Gastroenteritis  Longstanding symptoms of GERD and dyspepsia, has failed multiple PPIs and H2 blockers historically.  Had good control of symptoms long-term with use of high-dose Dexilant.  We tried to reduce her dose previously which did not  go well.  She recently had a switch of insurance, her Dexilant was no longer covered and switch back to Protonix and she continues to feel poorly with recurrent symptoms.  We discussed options.  She is clearly failed multiple other regimens for this and excellent does work quite well to control her symptoms.  We discussed long-term risks of chronic PPI use, she understands these risks and wants to resume Dexilant if at all possible given she is feeling so poorly.  I asked her to contact her insurance and see if she can file an appeal to get [place] approved, I will write a letter in support of this.  In the interim she will add Pepcid 20 mg twice daily, and Carafate as needed in the interim to see if this will provide any relief until we can get Dexilant for her.  We reviewed her history of fatty liver, more recently LFTs were normal last year, but BMI remains elevated.  Due for routine labs with  her primary care this year.  She has been to the weight loss clinic and continues to work on this, she will follow-up with her primary care for weight loss.  She understands risks for fibrotic change and cirrhosis long-term.  Recommend liberal coffee intake and continued monitoring.  Otherwise she had a gastroenteritis as noted above, reassured her stool color is okay in the setting, she has recovered from this okay, no further work-up needed.  Plan: - file appeal for Dexilant 31m / day, has failed multiple PPIs - discussed risks / benefits of long term PPIs - she wants to continue it - add Pepcid 245mBID  - add carafate 1gm tab PO every 6 hours PRN - l yearly LFTs ith PCP, counseled on fatty liver - work on weight loss, drink coffee, discussed risks of fatty liver  StJolly MangoMD [facility]

## 2021-10-23 NOTE — Patient Instructions (Addendum)
If you are age 62 or older, your body mass index should be between 23-30. Your Body mass index is 40.6 kg/m?Marland Kitchen If this is out of the aforementioned range listed, please consider follow up with your Primary Care Provider. ? ?If you are age 3 or younger, your body mass index should be between 19-25. Your Body mass index is 40.6 kg/m?Marland Kitchen If this is out of the aformentioned range listed, please consider follow up with your Primary Care Provider.  ? ?________________________________________________________ ? ?The  GI providers would like to encourage you to use [facility] to communicate with providers for non-urgent requests or questions.  Due to long hold times on the telephone, sending your provider a message by [facility] may be a faster and more efficient way to get a response.  Please allow 48 business hours for a response.  Please remember that this is for non-urgent requests.  ?_______________________________________________________ ? ?Continue Pepcid 20 mg twice a day and pantoprazole twice a day. ? ?We understand you will talk with your insurance company about appealing Dexilant coverage as you have tried pantoprazole, omeprazole and esomeprazole and none of them manage your symptoms like Dexilant.  ? ?We have sent the following medications to your pharmacy for you to pick up at your convenience: ?Carafate 1 g: Take once tablet every 6 hours as needed ? ?Thank you for entrusting me with your care and for choosing [employer], ?Dr. [place] ? ? ? ? ?

## 2021-10-26 ENCOUNTER — Other Ambulatory Visit (HOSPITAL_COMMUNITY): Payer: Self-pay

## 2021-10-26 ENCOUNTER — Other Ambulatory Visit (HOSPITAL_BASED_OUTPATIENT_CLINIC_OR_DEPARTMENT_OTHER): Payer: Self-pay

## 2021-10-26 ENCOUNTER — Telehealth: Payer: Self-pay | Admitting: Gastroenterology

## 2021-10-26 DIAGNOSIS — K219 Gastro-esophageal reflux disease without esophagitis: Secondary | ICD-10-CM

## 2021-10-26 MED ORDER — DEXLANSOPRAZOLE 60 MG PO CPDR
60.0000 mg | DELAYED_RELEASE_CAPSULE | Freq: Every day | ORAL | 2 refills | Status: DC
  Filled 2021-10-26 (×2): qty 90, 90d supply, fill #0
  Filled 2022-01-25 – 2022-02-06 (×2): qty 90, 90d supply, fill #1
  Filled 2022-02-23 – 2022-05-16 (×2): qty 30, 30d supply, fill #1

## 2021-10-26 NOTE — Telephone Encounter (Signed)
Patient called requesting a refill on her Dexilant. ?

## 2021-10-26 NOTE — Telephone Encounter (Signed)
Refill has been sent to [facility] ?

## 2021-11-03 ENCOUNTER — Encounter: Payer: Self-pay | Admitting: Family Medicine

## 2021-11-20 ENCOUNTER — Ambulatory Visit: Payer: No Typology Code available for payment source | Admitting: Gastroenterology

## 2021-11-30 ENCOUNTER — Other Ambulatory Visit: Payer: Self-pay | Admitting: Family Medicine

## 2021-11-30 ENCOUNTER — Other Ambulatory Visit (HOSPITAL_COMMUNITY): Payer: Self-pay

## 2021-11-30 DIAGNOSIS — J302 Other seasonal allergic rhinitis: Secondary | ICD-10-CM

## 2021-11-30 MED ORDER — LEVOCETIRIZINE DIHYDROCHLORIDE 5 MG PO TABS
ORAL_TABLET | Freq: Every evening | ORAL | 3 refills | Status: DC
  Filled 2021-11-30: qty 90, 90d supply, fill #0
  Filled 2022-01-26 – 2022-04-04 (×3): qty 90, 90d supply, fill #1
  Filled 2022-07-04: qty 90, 90d supply, fill #2
  Filled 2022-10-06: qty 90, 90d supply, fill #3

## 2021-12-11 ENCOUNTER — Ambulatory Visit (INDEPENDENT_AMBULATORY_CARE_PROVIDER_SITE_OTHER): Payer: No Typology Code available for payment source | Admitting: Family Medicine

## 2021-12-11 ENCOUNTER — Encounter: Payer: Self-pay | Admitting: Family Medicine

## 2021-12-11 VITALS — BP 102/82 | HR 83 | Temp 98.5°F | Resp 18 | Ht 64.75 in | Wt 240.0 lb

## 2021-12-11 DIAGNOSIS — E119 Type 2 diabetes mellitus without complications: Secondary | ICD-10-CM

## 2021-12-11 DIAGNOSIS — I10 Essential (primary) hypertension: Secondary | ICD-10-CM

## 2021-12-11 DIAGNOSIS — E785 Hyperlipidemia, unspecified: Secondary | ICD-10-CM

## 2021-12-11 DIAGNOSIS — Z0000 Encounter for general adult medical examination without abnormal findings: Secondary | ICD-10-CM

## 2021-12-11 DIAGNOSIS — R7303 Prediabetes: Secondary | ICD-10-CM | POA: Diagnosis not present

## 2021-12-11 DIAGNOSIS — R739 Hyperglycemia, unspecified: Secondary | ICD-10-CM | POA: Insufficient documentation

## 2021-12-11 DIAGNOSIS — K219 Gastro-esophageal reflux disease without esophagitis: Secondary | ICD-10-CM

## 2021-12-11 DIAGNOSIS — M791 Myalgia, unspecified site: Secondary | ICD-10-CM | POA: Diagnosis not present

## 2021-12-11 LAB — CBC WITH DIFFERENTIAL/PLATELET
Basophils Absolute: 0 10*3/uL (ref 0.0–0.1)
Basophils Relative: 0.5 % (ref 0.0–3.0)
Eosinophils Absolute: 0.1 10*3/uL (ref 0.0–0.7)
Eosinophils Relative: 1.6 % (ref 0.0–5.0)
HCT: 43.2 % (ref 36.0–46.0)
Hemoglobin: 14.1 g/dL (ref 12.0–15.0)
Lymphocytes Relative: 44.8 % (ref 12.0–46.0)
Lymphs Abs: 2.1 10*3/uL (ref 0.7–4.0)
MCHC: 32.7 g/dL (ref 30.0–36.0)
MCV: 89.6 fl (ref 78.0–100.0)
Monocytes Absolute: 0.4 10*3/uL (ref 0.1–1.0)
Monocytes Relative: 9.2 % (ref 3.0–12.0)
Neutro Abs: 2.1 10*3/uL (ref 1.4–7.7)
Neutrophils Relative %: 43.9 % (ref 43.0–77.0)
Platelets: 271 10*3/uL (ref 150.0–400.0)
RBC: 4.82 Mil/uL (ref 3.87–5.11)
RDW: 14.9 % (ref 11.5–15.5)
WBC: 4.8 10*3/uL (ref 4.0–10.5)

## 2021-12-11 LAB — COMPREHENSIVE METABOLIC PANEL
ALT: 37 U/L — ABNORMAL HIGH (ref 0–35)
AST: 29 U/L (ref 0–37)
Albumin: 4.5 g/dL (ref 3.5–5.2)
Alkaline Phosphatase: 67 U/L (ref 39–117)
BUN: 13 mg/dL (ref 6–23)
CO2: 32 mEq/L (ref 19–32)
Calcium: 9.9 mg/dL (ref 8.4–10.5)
Chloride: 101 mEq/L (ref 96–112)
Creatinine, Ser: 0.91 mg/dL (ref 0.40–1.20)
GFR: 67.9 mL/min (ref >60.00)
Glucose, Bld: 93 mg/dL (ref 70–99)
Potassium: 4.2 mEq/L (ref 3.5–5.1)
Sodium: 141 mEq/L (ref 135–145)
Total Bilirubin: 0.6 mg/dL (ref 0.2–1.2)
Total Protein: 7.5 g/dL (ref 6.0–8.3)

## 2021-12-11 LAB — LIPID PANEL
Cholesterol: 199 mg/dL (ref 0–200)
HDL: 49.4 mg/dL (ref >39.00)
LDL Cholesterol: 113 mg/dL — ABNORMAL HIGH (ref 0–99)
NonHDL: 149.44
Total CHOL/HDL Ratio: 4
Triglycerides: 180 mg/dL — ABNORMAL HIGH (ref 0.0–149.0)
VLDL: 36 mg/dL (ref 0.0–40.0)

## 2021-12-11 LAB — HEMOGLOBIN A1C: Hgb A1c MFr Bld: 6.1 % (ref 4.6–6.5)

## 2021-12-11 LAB — SEDIMENTATION RATE: Sed Rate: 9 mm/hr (ref 0–30)

## 2021-12-11 LAB — TSH: TSH: 0.71 u[IU]/mL (ref 0.35–5.50)

## 2021-12-11 NOTE — Progress Notes (Signed)
? ?Subjective:  ? ?By signing my name below, I, Emily Osborne, attest that this documentation has been prepared under the direction and in the presence of Emily Osborne, 12/11/2021   ? ? Patient ID: Emily Osborne, female    DOB: [birth year], 61 y.o.   MRN: [id number] ? ?Chief Complaint  ?Patient presents with  ? Annual Exam  ?  Pt states she has not eaten this morning but has had coffee with creamer  ? ? ?HPI ?Patient is in today for a comprehensive physical exam. ? ?She periodically checks her blood sugar. She reports that her sugar levels have been in the 90's ?Lab Results  ?Component Value Date  ? HGBA1C 6.0 02/02/2021  ? ?She states that she is going to see a specialist for her joint pain on 12/13/2021. ? ?She denies having any fever, ear pain, new muscle pain, new moles, congestion, sinus pain, sore throat, palpations, wheezing, n/v/d, constipation, blood in stool, dysuria, frequency, hematuria at this time. ? ?Colonoscopy last completed on 10/16/2018 ?Dexa last completed on 12/16/2019 ?Pap Smear last completed on 08/07/18 ?Mammogram last completed on 08/09/2021 ?She is UTD on vision exams ? ? ?Past Medical History:  ?Diagnosis Date  ? Allergy   ? Anxiety   ? Asthma   ? pt has inhaler  ? Back pain   ? C. difficile colitis   ? Cataract   ? bil eyes  ? Chest pain   ? Chronic fatigue syndrome   ? Clostridium difficile colitis 12/13/2016  ? Diverticulitis   ? Dyspnea   ? Dysrhythmia   ? palpitations  ? Edema   ? Elevated blood pressure reading without diagnosis of hypertension   ? "just elevated when I'm in pain" (12/07/2015)  ? Fatty liver   ? Fibromyalgia   ? Ganglion of joint   ? right wrist  ? GERD (gastroesophageal reflux disease)   ? H. pylori infection   ? Headache   ? hx of  ? Helicobacter pylori (H. pylori) infection 12/13/2016  ? Herpes zoster without mention of complication   ? HTN (hypertension)   ? Hyperlipidemia   ? Hyperthyroidism   ? IBS (irritable bowel syndrome)   ? Leg edema   ? Multiple  drug allergies 06/18/2017  ? Myalgia   ? Nontraumatic rupture of Achilles tendon   ? Other acute reactions to stress   ? Pain in joint, shoulder region   ? Pain in joint, upper arm   ? Pain in limb   ? Palpitations   ? Personal history of other diseases of digestive system   ? Pneumonia   ? once  ? PONV (postoperative nausea and vomiting)   ? Routine screening for STI (sexually transmitted infection) 12/13/2016  ? S/P partial colectomy 06/18/2017  ? Thyroid disease   ? hyperthyroidism  ? Type 2 diabetes mellitus with hyperglycemia, without long-term current use of insulin ([place]) 08/07/2018  ? Vitamin B 12 deficiency   ? ? ?Past Surgical History:  ?Procedure Laterality Date  ? ABDOMINAL HYSTERECTOMY    ? ACHILLES TENDON REPAIR Right 2005  ? BUNIONECTOMY Bilateral   ? Bunionectomy 1983  ? COLON SURGERY  01/23/2017  ? 6 to 8 inches sigmoid colon removed  ? COLONOSCOPY    ? GANGLION CYST EXCISION Right   ? wrist  ? LEFT HEART CATH AND CORONARY ANGIOGRAPHY N/A 10/03/2016  ? Procedure: Left Heart Cath and Coronary Angiography;  Surgeon: Burnell Blanks, MD;  Location: [facility];  Service: Cardiovascular;  Laterality: N/A;  ? MENISCUS REPAIR Right 04/2014  ? POLYPECTOMY    ? TUBAL LIGATION    ? wisdomteeth extraction    ? ? ?Family History  ?Problem Relation Age of Onset  ? Hypertension Father   ? Diabetes Father   ? Heart disease Father   ? Kidney disease Father   ?     Died, 49  ? Hyperlipidemia Father   ? Leukemia Brother   ? Cancer Brother   ?     myleoblastic anemia  ? Hyperlipidemia Mother   ? Hypertension Mother   ?     Died, 84  ? Thyroid disease Mother   ?     Thyroid surgery  ? Obesity Mother   ? Heart disease Mother   ? Pernicious anemia Sister   ? Thyroid disease Sister   ?     On thyroid Rx  ? Lupus Daughter   ? Coronary artery disease Brother   ? Hypertension Brother   ? Colon cancer Neg Hx   ? Esophageal cancer Neg Hx   ? Rectal cancer Neg Hx   ? Stomach cancer Neg Hx   ? Pancreatic cancer Neg Hx    ? Prostate cancer Neg Hx   ? Colon polyps Neg Hx   ? ? ?Social History  ? ?Socioeconomic History  ? Marital status: Divorced  ?  Spouse name: Not on file  ? Number of children: 2  ? Years of education: Not on file  ? Highest education level: Not on file  ?Occupational History  ? Occupation: Nurse/teacher  ?Tobacco Use  ? Smoking status: Never  ? Smokeless tobacco: Never  ?Vaping Use  ? Vaping Use: Never used  ?Substance and Sexual Activity  ? Alcohol use: No  ?  Alcohol/week: 0.0 standard drinks  ? Drug use: No  ? Sexual activity: Not Currently  ?  Partners: Male  ?Other Topics Concern  ? Not on file  ?Social History Narrative  ? Lives with alone.  She has two daughter.  ? She works as a Programme researcher, broadcasting/film/video at [employer]  ? Exercise--- no  ? Right Handed  ? ?Social Determinants of Health  ? ?Financial Resource Strain: Not on file  ?Food Insecurity: Not on file  ?Transportation Needs: Not on file  ?Physical Activity: Not on file  ?Stress: Not on file  ?Social Connections: Not on file  ?Intimate Partner Violence: Not on file  ? ? ?Outpatient Medications Prior to Visit  ?Medication Sig Dispense Refill  ? acetaminophen (TYLENOL) 500 MG tablet Take 1,000 mg by mouth every 6 (six) hours as needed for moderate pain.    ? albuterol (PROAIR HFA) 108 (90 Base) MCG/ACT inhaler USE 2 PUFFS EVERY 4 HOURS AS NEEDED FOR COUGH, WHEEZE OR SHORTNESS OF BREATH. 8.5 each 2  ? ALPRAZolam (XANAX) 0.25 MG tablet TAKE 1 TABLET BY MOUTH THREE TIMES A DAY AS NEEDED 30 tablet 2  ? AMBULATORY NON FORMULARY MEDICATION Medication Name: Critic-Aid Clear-Clear Moisture Barrier Ointment  ?Secondary school teacher ? ?Apply to affected area as needed 30 g 11  ? azelastine (OPTIVAR) 0.05 % ophthalmic solution Place 1 drop into both eyes 2 (two) times daily as needed.    ? Blood Glucose Monitoring Suppl (FREESTYLE FREEDOM) KIT 1 Device by Does not apply route daily. 1 each 0  ? budesonide-formoterol (SYMBICORT) 80-4.5 MCG/ACT inhaler INHALE 2 PUFFS BY MOUTH  FIRST THING IN THE MORNING AND THEN ANOTHER 2 PUFFS ABOUT 12 HOURS LATER  10.2 g 4  ? dexlansoprazole (DEXILANT) 60 MG capsule Take 1 capsule (60 mg total) by mouth daily. 90 capsule 2  ? diclofenac sodium (VOLTAREN) 1 % GEL Apply 2 g topically 4 (four) times daily. Rub into affected area of foot 2 to 4 times daily 100 g 2  ? famotidine (PEPCID) 20 MG tablet One after supper 30 tablet 11  ? fluticasone (FLONASE) 50 MCG/ACT nasal spray USE 2 SPRAYS INTO BOTH NOSTRILS DAILY. 16 g 5  ? glucose blood (FREESTYLE LITE) test strip USE TO CHECK BLOOD SUGAR ONCE A DAY 100 each 1  ? hydrochlorothiazide (HYDRODIURIL) 25 MG tablet Take 1 tablet (25 mg total) by mouth daily. 90 tablet 3  ? Iron Combinations (I.L.X. B-12) ELIX Take 1 each by mouth daily as needed (energy). 1 droplet full    ? levocetirizine (XYZAL) 5 MG tablet TAKE 1 TABLET BY MOUTH EVERY EVENING. 90 tablet 3  ? lidocaine (LIDODERM) 5 % Apply to affected area QD PRN for a Week 30 patch 0  ? Probiotic Product (ALIGN) 4 MG CAPS Take 1 capsule (4 mg total) by mouth daily. 90 capsule 3  ? sucralfate (CARAFATE) 1 g tablet Take 1 tablet by mouth every 6 hours as needed. 60 tablet 3  ? valACYclovir (VALTREX) 1000 MG tablet Take 1 tablet (1,000 mg total) by mouth 3 (three) times daily as needed. 30 tablet 2  ? pantoprazole (PROTONIX) 40 MG tablet Take 1 tablet (40 mg total) by mouth 2 (two) times daily before a meal. (Patient not taking: Reported on 12/11/2021) 60 tablet 2  ? ?No facility-administered medications prior to visit.  ? ? ?Allergies  ?Allergen Reactions  ? Aspirin Other (See Comments)  ?  REACTION: anaphylaxis  ? Gentamicin Other (See Comments)  ?  Eye drops turned the sclera bright red  ? Ibuprofen Other (See Comments)  ?  REACTION: anaphylaxsis  ? Metronidazole Swelling  ?  REACTION: red face/swelling  ? Nsaids Other (See Comments)  ?  REACTION: anaphylaxis  ? Doxycycline Other (See Comments)  ?  REACTION: severe nausea/vomiting  ? Influenza A (H1n1) Monoval  Vac Other (See Comments)  ?  REACTION: sick for 3 weeks  ? Loratadine Other (See Comments)  ?  Fatigue/weakness  ? Metformin And Related Other (See Comments)  ?  Memory issues and headache  ? Periactin

## 2021-12-11 NOTE — Assessment & Plan Note (Signed)
con't diet  ?Check labs today ?

## 2021-12-11 NOTE — Patient Instructions (Signed)

## 2021-12-11 NOTE — Assessment & Plan Note (Signed)
Pt requesting labs be done ?

## 2021-12-11 NOTE — Assessment & Plan Note (Signed)
Encourage heart healthy diet such as MIND or DASH diet, increase exercise, avoid trans fats, simple carbohydrates and processed foods, consider a krill or fish or flaxseed oil cap daily.  °

## 2021-12-11 NOTE — Assessment & Plan Note (Signed)
Per GI ?On dexilant ?

## 2021-12-11 NOTE — Assessment & Plan Note (Signed)
ghm utd Check labs  See avs  

## 2021-12-12 LAB — MICROALBUMIN / CREATININE URINE RATIO
Creatinine,U: 62.7 mg/dL
Microalb Creat Ratio: 1.1 mg/g (ref 0.0–30.0)
Microalb, Ur: <0.7 mg/dL (ref 0.0–1.9)

## 2021-12-13 ENCOUNTER — Ambulatory Visit (INDEPENDENT_AMBULATORY_CARE_PROVIDER_SITE_OTHER): Payer: No Typology Code available for payment source

## 2021-12-13 ENCOUNTER — Encounter: Payer: Self-pay | Admitting: Surgical

## 2021-12-13 ENCOUNTER — Ambulatory Visit: Payer: No Typology Code available for payment source | Admitting: Surgical

## 2021-12-13 DIAGNOSIS — M25562 Pain in left knee: Secondary | ICD-10-CM | POA: Diagnosis not present

## 2021-12-13 DIAGNOSIS — M25512 Pain in left shoulder: Secondary | ICD-10-CM | POA: Diagnosis not present

## 2021-12-13 LAB — RHEUMATOID FACTOR: Rheumatoid fact SerPl-aCnc: <14 IU/mL (ref <14)

## 2021-12-13 LAB — ANTI-NUCLEAR AB-TITER (ANA TITER)
ANA TITER: 1:320 {titer} — ABNORMAL HIGH
ANA Titer 1: 1:80 {titer} — ABNORMAL HIGH

## 2021-12-13 LAB — ANA: Anti Nuclear Antibody (ANA): POSITIVE — AB

## 2021-12-13 NOTE — Progress Notes (Signed)
? ?Office Visit Note ?  ?Patient: Emily Osborne           ?Date of Birth: [birth year]           ?MRN: [id number] ?Visit Date: 12/13/2021 ?Requested by: Carollee Herter, Kendrick Fries R, DO ?[address] ?[address] ?[place],  [place] [address] ?PCP: [name], DO ? ?Subjective: ?Chief Complaint  ?Patient presents with  ? Left Knee - Pain  ? ? ?HPI: Emily Osborne is a 61 y.o. female who presents to the office complaining of left knee and left shoulder pain.  Patient states that her left knee began bothering her last week when she bent over and noticed severe medial sided left knee pain.  She was able to rest her knee and has had significant improvement since then but basically had to stay on her couch all weekend in order to offload the knee.  She has been taking Tylenol 650 mg with good relief of her symptoms.  She did note swelling at the time.  Denies any lateral pain or groin pain.  She has history of prior surgery of the right knee but has never had surgery on her left knee.  Denies any mechanical symptoms or instability of the knee.  She feels she is walking without a limp and is satisfied with how improved her pain is now. ? ?Regarding her left shoulder, she has had treatment for suspected adhesive capsulitis with her 1 prior glenohumeral injection and physical therapy exercises devoted to range of motion.  She notes pain that is worse at night and stiffness of the shoulder.  She does not feel weak but just complains of mostly pain.  No recent injuries.  Denies any history of surgery to the shoulder.  She does not want any manipulation under anesthesia.Marland Kitchen   ?             ?ROS: All systems reviewed are negative as they relate to the chief complaint within the history of present illness.  Patient denies fevers or chills. ? ?Assessment & Plan: ?Visit Diagnoses:  ?1. Acute pain of left knee   ?2. Left shoulder pain, unspecified chronicity   ? ? ?Plan: Patient is a 61 year old female who presents for evaluation  of left knee and left shoulder pain.  Left knee she injured last week when she bent over and noticed increased medial sided left knee pain.  No mechanical symptoms or instability and pain has significantly improved since last week to the point where she is walking without antalgia on exam today.  Radiographs show no evidence of acute abnormality of the left knee and there is not really any arthritis in the medial compartment where most of her pain tenderness is.  No pain with stressing the MCL.  Impression is knee strain versus meniscal tear versus acute exacerbation of occult arthritis which is less likely.  Discussed the options available to patient and she would like to hold off on any injection for now.  She will follow-up with the office as needed regarding her left knee pain. ? ?For the left shoulder, she had new radiographs today with her continued complaint.  Left shoulder radiographs do demonstrate increased glenohumeral joint space narrowing compared with last set of radiographs.  She also had a very early inferior humeral head osteophyte on the last set of radiographs indicative of glenohumeral arthritis.  After discussion of options, she would like to try glenohumeral injection.  Plan for referral to Dr. Laurence Spates  for glenohumeral injection as she has had injection with him before and it went very smoothly for her..   ? ?Follow-Up Instructions: No follow-ups on file.  ? ?Orders:  ?Orders Placed This Encounter  ?Procedures  ? XR KNEE 3 VIEW LEFT  ? XR Shoulder Left  ? ?No orders of the defined types were placed in this encounter. ? ? ? ? Procedures: ?No procedures performed ? ? ?Clinical Data: ?No additional findings. ? ?Objective: ?Vital Signs: There were no vitals taken for this visit. ? ?Physical Exam:  ?Constitutional: Patient appears well-developed ?HEENT:  ?Head: Normocephalic ?Eyes:EOM are normal ?Neck: Normal range of motion ?Cardiovascular: Normal rate ?Pulmonary/chest: Effort  normal ?Neurologic: Patient is alert ?Skin: Skin is warm ?Psychiatric: Patient has normal mood and affect ? ?Ortho Exam: Ortho exam demonstrates left knee without significant effusion.  0 degrees extension and 115 degrees of knee flexion.  No calf tenderness.  Negative Homans' sign.  Moderate tenderness over the medial joint line.  No tenderness over the lateral joint line.  Patient is able to perform straight leg raise without extensor lag.  No pain with hip range of motion.  Negative Stinchfield sign.  No pain with valgus stressing of the left knee at 0 or 30 degrees ? ? ?Left shoulder with 15 degrees external rotation, 70 degrees abduction, 100 degrees forward flexion.  Excellent rotator cuff strength of supra, infra, subscap rated 5/5.  Axillary nerve is intact with deltoid firing.  Some mild coarse grinding noted with passive motion of the shoulder.  No tenderness over the [facility] joint.  Mild tenderness over the bicipital groove.  5/5 motor strength of bilateral grip strength, finger abduction, pronation/supination, bicep, tricep, deltoid. ? ? ?Specialty Comments:  ?No specialty comments available. ? ?Imaging: ?No results found. ? ? ?PMFS History: ?Patient Active Problem List  ? Diagnosis Date Noted  ? Diet-controlled diabetes mellitus ([place]) 12/11/2021  ? Swelling 02/02/2021  ? Right lower quadrant abdominal pain 02/02/2021  ? History of COVID-19 10/17/2020  ? Shortness of breath 10/17/2020  ? COVID-19 10/12/2020  ? Dizziness 05/23/2020  ? Primary hypertension 06/11/2019  ? New daily persistent headache 06/11/2019  ? Memory loss 06/11/2019  ? Upper airway cough syndrome vs cough variant asthma 01/01/2019  ? Chest pain 12/09/2018  ? Prediabetes 10/07/2018  ? Preventative health care 08/07/2018  ? Anxiety 10/11/2017  ? S/P partial colectomy 06/18/2017  ? Multiple drug allergies 06/18/2017  ? Colitis 02/05/2017  ? Diarrhea 02/05/2017  ? Diverticular disease 01/23/2017  ? Helicobacter pylori (H. pylori) infection  12/13/2016  ? Routine screening for STI (sexually transmitted infection) 12/13/2016  ? Clostridium difficile colitis 12/13/2016  ? Diverticulitis 10/10/2016  ? Lower abdominal pain 09/29/2016  ? Palpitations 02/06/2016  ? Mild diastolic dysfunction 24/04/7352  ? Graves disease 01/02/2016  ? Acute diverticulitis 12/07/2015  ? Acute bacterial sinusitis 10/04/2015  ? History of colonic polyps 11/15/2014  ? Left shoulder pain 09/28/2013  ? Left-sided weakness 09/16/2013  ? Obesity (BMI 30-39.9) with low erv on pfts 09/25/19 09/02/2013  ? Myalgia 06/21/2011  ? POSTMENOPAUSAL STATUS 09/07/2010  ? PAIN IN JOINT, MULTIPLE SITES 12/27/2009  ? LOW BACK PAIN, CHRONIC 12/27/2009  ? FATIGUE 12/27/2009  ? VAGINITIS 11/22/2009  ? DYSPEPSIA&OTHER [facility] DISORDERS FUNCTION STOMACH 09/01/2009  ? NAUSEA 08/03/2009  ? FLATULENCE-GAS-BLOATING 08/03/2009  ? ABDOMINAL PAIN RIGHT UPPER QUADRANT 07/06/2009  ? SUPRAPUBIC PAIN 07/06/2009  ? GANGLION CYST, HX OF 07/06/2009  ? BACK PAIN 05/04/2009  ? Hyperlipidemia 09/16/2008  ? UNSPECIFIED MYALGIA  AND MYOSITIS 09/16/2008  ? Precordial pain 07/27/2008  ? GERD 06/08/2008  ? HELICOBACTER PYLORI GASTRITIS, HX OF 04/05/2008  ? OTHER ACUTE REACTIONS TO STRESS 03/16/2008  ? ABDOMINAL PAIN, EPIGASTRIC 03/16/2008  ? SHOULDER PAIN, RIGHT 10/01/2007  ? ELBOW PAIN, RIGHT 10/01/2007  ? Pain in limb 04/14/2007  ? DEPENDENT EDEMA, RIGHT LEG 04/14/2007  ? SHINGLES 04/08/2007  ? Asthma 03/18/2007  ? GANGLION CYST, WRIST, RIGHT 03/18/2007  ? BUNIONECTOMY, HX OF 03/18/2007  ? ?Past Medical History:  ?Diagnosis Date  ? Allergy   ? Anxiety   ? Asthma   ? pt has inhaler  ? Back pain   ? C. difficile colitis   ? Cataract   ? bil eyes  ? Chest pain   ? Chronic fatigue syndrome   ? Clostridium difficile colitis 12/13/2016  ? Diverticulitis   ? Dyspnea   ? Dysrhythmia   ? palpitations  ? Edema   ? Elevated blood pressure reading without diagnosis of hypertension   ? "just elevated when I'm in pain" (12/07/2015)  ? Fatty  liver   ? Fibromyalgia   ? Ganglion of joint   ? right wrist  ? GERD (gastroesophageal reflux disease)   ? H. pylori infection   ? Headache   ? hx of  ? Helicobacter pylori (H. pylori) infection 12/13/2016  ? Herpes zoster witho

## 2021-12-15 ENCOUNTER — Other Ambulatory Visit: Payer: Self-pay | Admitting: Family Medicine

## 2021-12-15 DIAGNOSIS — R768 Other specified abnormal immunological findings in serum: Secondary | ICD-10-CM

## 2022-01-03 ENCOUNTER — Other Ambulatory Visit (HOSPITAL_COMMUNITY): Payer: Self-pay

## 2022-01-23 ENCOUNTER — Ambulatory Visit (INDEPENDENT_AMBULATORY_CARE_PROVIDER_SITE_OTHER): Payer: No Typology Code available for payment source | Admitting: Family Medicine

## 2022-01-23 ENCOUNTER — Encounter: Payer: Self-pay | Admitting: Family Medicine

## 2022-01-23 VITALS — BP 116/68 | HR 98 | Temp 98.5°F | Resp 18 | Ht 64.75 in | Wt 235.0 lb

## 2022-01-23 DIAGNOSIS — R413 Other amnesia: Secondary | ICD-10-CM

## 2022-01-23 DIAGNOSIS — R7689 Other specified abnormal immunological findings in serum: Secondary | ICD-10-CM

## 2022-01-23 DIAGNOSIS — Z111 Encounter for screening for respiratory tuberculosis: Secondary | ICD-10-CM

## 2022-01-23 DIAGNOSIS — M2550 Pain in unspecified joint: Secondary | ICD-10-CM | POA: Diagnosis not present

## 2022-01-23 DIAGNOSIS — R768 Other specified abnormal immunological findings in serum: Secondary | ICD-10-CM | POA: Diagnosis not present

## 2022-01-23 NOTE — Patient Instructions (Signed)

## 2022-01-23 NOTE — Progress Notes (Signed)
Subjective:   By signing my name below, I, Emily Osborne, attest that this documentation has been prepared under the direction and in the presence of [name], DO  01/23/2022    Patient ID: Emily Osborne, female    DOB: April 30, 1961, 61 y.o.   MRN: [id number]  Chief Complaint  Patient presents with   Generalized Body Aches    Pt states having body aches and not thinking clearing.  Pt needs TB blood test    HPI Patient is in today for a office visit.   She complains worsening fatigue. She reports yesterday feeling so fatigued she had to call of work and lay down. She then slept from 1pm till night. She is requesting to check her thyroid levels. She also complains of pains in the joints of her hands. She has an upcomming appointment with a rheumatologist on August, 2023.  She also complains of difficulty with her memory. She finds she has trouble remembering simple things such as location or name of where she is going. She is planning on seeing her neurologist to manage her symptoms.  She also complains of decreased appetite. She thinks it is due to recovering from a cold a couple weeks ago.  She is requesting a TB test for her workplace.  She has seen an eye doctor of her eyes twitching and found it is due to stress or caffeine.    Past Medical History:  Diagnosis Date   Allergy    Anxiety    Asthma    pt has inhaler   Back pain    C. difficile colitis    Cataract    bil eyes   Chest pain    Chronic fatigue syndrome    Clostridium difficile colitis 12/13/2016   Diverticulitis    Dyspnea    Dysrhythmia    palpitations   Edema    Elevated blood pressure reading without diagnosis of hypertension    "just elevated when I'm in pain" (12/07/2015)   Fatty liver    Fibromyalgia    Ganglion of joint    right wrist   GERD (gastroesophageal reflux disease)    H. pylori infection    Headache    hx of   Helicobacter pylori (H. pylori) infection 12/13/2016   Herpes  zoster without mention of complication    HTN (hypertension)    Hyperlipidemia    Hyperthyroidism    IBS (irritable bowel syndrome)    Leg edema    Multiple drug allergies 06/18/2017   Myalgia    Nontraumatic rupture of Achilles tendon    Other acute reactions to stress    Pain in joint, shoulder region    Pain in joint, upper arm    Pain in limb    Palpitations    Personal history of other diseases of digestive system    Pneumonia    once   PONV (postoperative nausea and vomiting)    Routine screening for STI (sexually transmitted infection) 12/13/2016   S/P partial colectomy 06/18/2017   Thyroid disease    hyperthyroidism   Type 2 diabetes mellitus with hyperglycemia, without long-term current use of insulin ([place]) 08/07/2018   Vitamin B 12 deficiency     Past Surgical History:  Procedure Laterality Date   ABDOMINAL HYSTERECTOMY     ACHILLES TENDON REPAIR Right 2005   BUNIONECTOMY Bilateral    Bunionectomy 1983   COLON SURGERY  01/23/2017   6 to 8 inches sigmoid colon removed  COLONOSCOPY     GANGLION CYST EXCISION Right    wrist   LEFT HEART CATH AND CORONARY ANGIOGRAPHY N/A 10/03/2016   Procedure: Left Heart Cath and Coronary Angiography;  Surgeon: Burnell Blanks, MD;  Location: [facility];  Service: Cardiovascular;  Laterality: N/A;   MENISCUS REPAIR Right 04/2014   POLYPECTOMY     TUBAL LIGATION     wisdomteeth extraction      Family History  Problem Relation Age of Onset   Hypertension Father    Diabetes Father    Heart disease Father    Kidney disease Father        Died, 60   Hyperlipidemia Father    Leukemia Brother    Cancer Brother        myleoblastic anemia   Hyperlipidemia Mother    Hypertension Mother        Died, 56   Thyroid disease Mother        Thyroid surgery   Obesity Mother    Heart disease Mother    Pernicious anemia Sister    Thyroid disease Sister        On thyroid Rx   Lupus Daughter    Coronary artery disease  Brother    Hypertension Brother    Colon cancer Neg Hx    Esophageal cancer Neg Hx    Rectal cancer Neg Hx    Stomach cancer Neg Hx    Pancreatic cancer Neg Hx    Prostate cancer Neg Hx    Colon polyps Neg Hx     Social History   Socioeconomic History   Marital status: Divorced    Spouse name: Not on file   Number of children: 2   Years of education: Not on file   Highest education level: Not on file  Occupational History   Occupation: Personnel officer  Tobacco Use   Smoking status: Never   Smokeless tobacco: Never  Vaping Use   Vaping Use: Never used  Substance and Sexual Activity   Alcohol use: No    Alcohol/week: 0.0 standard drinks of alcohol   Drug use: No   Sexual activity: Not Currently    Partners: Male  Other Topics Concern   Not on file  Social History Narrative   Lives with alone.  She has two daughter.   She works as a Programme researcher, broadcasting/film/video at [employer]--- no   Right Handed   Social Determinants of Radio broadcast assistant Strain: Not on file  Food Insecurity: Not on file  Transportation Needs: Not on file  Physical Activity: Not on file  Stress: Not on file  Social Connections: Not on file  Intimate Partner Violence: Not on file    Outpatient Medications Prior to Visit  Medication Sig Dispense Refill   acetaminophen (TYLENOL) 500 MG tablet Take 1,000 mg by mouth every 6 (six) hours as needed for moderate pain.     albuterol (PROAIR HFA) 108 (90 Base) MCG/ACT inhaler USE 2 PUFFS EVERY 4 HOURS AS NEEDED FOR COUGH, WHEEZE OR SHORTNESS OF BREATH. 8.5 each 2   ALPRAZolam (XANAX) 0.25 MG tablet TAKE 1 TABLET BY MOUTH THREE TIMES A DAY AS NEEDED 30 tablet 2   AMBULATORY NON FORMULARY MEDICATION Medication Name: C[facility]-A[facility] Dentist  Apply to affected area as needed 30 g 11   azelastine (OPTIVAR) 0.05 % ophthalmic solution Place 1 drop into both eyes 2 (two) times daily as needed.  Blood  Glucose Monitoring Suppl (FREESTYLE FREEDOM) KIT 1 Device by Does not apply route daily. 1 each 0   budesonide-formoterol (SYMBICORT) 80-4.5 MCG/ACT inhaler INHALE 2 PUFFS BY MOUTH FIRST THING IN THE MORNING AND THEN ANOTHER 2 PUFFS ABOUT 12 HOURS LATER 10.2 g 4   dexlansoprazole (DEXILANT) 60 MG capsule Take 1 capsule (60 mg total) by mouth daily. 90 capsule 2   diclofenac sodium (VOLTAREN) 1 % GEL Apply 2 g topically 4 (four) times daily. Rub into affected area of foot 2 to 4 times daily 100 g 2   famotidine (PEPCID) 20 MG tablet One after supper 30 tablet 11   fluticasone (FLONASE) 50 MCG/ACT nasal spray USE 2 SPRAYS INTO BOTH NOSTRILS DAILY. 16 g 5   glucose blood (FREESTYLE LITE) test strip USE TO CHECK BLOOD SUGAR ONCE A DAY 100 each 1   hydrochlorothiazide (HYDRODIURIL) 25 MG tablet Take 1 tablet (25 mg total) by mouth daily. 90 tablet 3   Iron Combinations (I.L.X. B-12) ELIX Take 1 each by mouth daily as needed (energy). 1 droplet full     levocetirizine (XYZAL) 5 MG tablet TAKE 1 TABLET BY MOUTH EVERY EVENING. 90 tablet 3   Probiotic Product (ALIGN) 4 MG CAPS Take 1 capsule (4 mg total) by mouth daily. 90 capsule 3   sucralfate (CARAFATE) 1 g tablet Take 1 tablet by mouth every 6 hours as needed. 60 tablet 3   valACYclovir (VALTREX) 1000 MG tablet Take 1 tablet (1,000 mg total) by mouth 3 (three) times daily as needed. 30 tablet 2   lidocaine (LIDODERM) 5 % Apply to affected area QD PRN for a Week 30 patch 0   No facility-administered medications prior to visit.    Allergies  Allergen Reactions   Aspirin Other (See Comments)    REACTION: anaphylaxis   Gentamicin Other (See Comments)    Eye drops turned the sclera bright red   Ibuprofen Other (See Comments)    REACTION: anaphylaxsis   Metronidazole Swelling    REACTION: red face/swelling   Nsaids Other (See Comments)    REACTION: anaphylaxis   Doxycycline Other (See Comments)    REACTION: severe nausea/vomiting   Influenza A  (H1n1) [place] Other (See Comments)    REACTION: sick for 3 weeks   Loratadine Other (See Comments)    Fatigue/weakness   Metformin And Related Other (See Comments)    Memory issues and headache   Periactin [Cyproheptadine] Other (See Comments)    paranoid    Ranitidine Hcl Other (See Comments)    REACTION: Lips turned red /peel   Sulfamethoxazole-Trimethoprim Other (See Comments)    REACTION: face red/peel   Tramadol Hcl Other (See Comments)    REACTION: paranoid    Review of Systems  Constitutional:  Positive for malaise/fatigue.       (+)decreased appetite  Musculoskeletal:  Positive for joint pain (bilateral hands).  Psychiatric/Behavioral:  Positive for memory loss (mild).        (+)unexpected sleeping for long periods       Objective:    Physical Exam Constitutional:      General: She is not in acute distress.    Appearance: Normal appearance. She is not ill-appearing.  HENT:     Head: Normocephalic and atraumatic.     Right Ear: External ear normal.     Left Ear: External ear normal.  Eyes:     Extraocular Movements: Extraocular movements intact.     Pupils: Pupils are equal, round, and  reactive to light.  Cardiovascular:     Rate and Rhythm: Normal rate and regular rhythm.     Heart sounds: Normal heart sounds. No murmur heard.    No gallop.  Pulmonary:     Effort: Pulmonary effort is normal. No respiratory distress.     Breath sounds: Normal breath sounds. No wheezing or rales.  Skin:    General: Skin is warm and dry.  Neurological:     Mental Status: She is alert and oriented to person, place, and time.  Psychiatric:        Judgment: Judgment normal.     BP 116/68 (BP Location: Right Arm, Patient Position: Sitting, Cuff Size: Large)   Pulse 98   Temp 98.5 F (36.9 C) (Oral)   Resp 18   Ht 5' 4.75" (1.645 m)   Wt 235 lb (106.6 kg)   SpO2 98%   BMI 39.41 kg/m  Wt Readings from Last 3 Encounters:  01/23/22 235 lb (106.6 kg)  12/11/21 240  lb (108.9 kg)  10/23/21 242 lb 2 oz (109.8 kg)    Diabetic Foot Exam - Simple   No data filed    Lab Results  Component Value Date   WBC 4.8 12/11/2021   HGB 14.1 12/11/2021   HCT 43.2 12/11/2021   PLT 271.0 12/11/2021   GLUCOSE 99 01/23/2022   CHOL 199 12/11/2021   TRIG 180.0 (H) 12/11/2021   HDL 49.40 12/11/2021   LDLDIRECT 139.4 12/26/2011   LDLCALC 113 (H) 12/11/2021   ALT 38 (H) 01/23/2022   AST 31 01/23/2022   NA 139 01/23/2022   K 3.7 01/23/2022   CL 100 01/23/2022   CREATININE 0.73 01/23/2022   BUN 16 01/23/2022   CO2 30 01/23/2022   TSH <0.01 (L) 01/23/2022   INR 1.0 06/03/2020   HGBA1C 6.1 12/11/2021   MICROALBUR <0.7 12/11/2021    Lab Results  Component Value Date   TSH <0.01 (L) 01/23/2022   Lab Results  Component Value Date   WBC 4.8 12/11/2021   HGB 14.1 12/11/2021   HCT 43.2 12/11/2021   MCV 89.6 12/11/2021   PLT 271.0 12/11/2021   Lab Results  Component Value Date   NA 139 01/23/2022   K 3.7 01/23/2022   CO2 30 01/23/2022   GLUCOSE 99 01/23/2022   BUN 16 01/23/2022   CREATININE 0.73 01/23/2022   BILITOT 0.5 01/23/2022   ALKPHOS 57 01/23/2022   AST 31 01/23/2022   ALT 38 (H) 01/23/2022   PROT 6.8 01/23/2022   ALBUMIN 3.8 01/23/2022   CALCIUM 9.6 01/23/2022   ANIONGAP 10 06/03/2020   GFR 88.39 01/23/2022   Lab Results  Component Value Date   CHOL 199 12/11/2021   Lab Results  Component Value Date   HDL 49.40 12/11/2021   Lab Results  Component Value Date   LDLCALC 113 (H) 12/11/2021   Lab Results  Component Value Date   TRIG 180.0 (H) 12/11/2021   Lab Results  Component Value Date   CHOLHDL 4 12/11/2021   Lab Results  Component Value Date   HGBA1C 6.1 12/11/2021       Assessment & Plan:   Problem List Items Addressed This Visit       Unprioritized   PAIN IN JOINT, MULTIPLE SITES   Relevant Orders   Ambulatory referral to Rheumatology   Comprehensive metabolic panel (Completed)   Other Visit Diagnoses      Visit for TB skin test    -  Primary   Relevant Orders   QuantiFERON-TB Gold Plus (Completed)   Positive ANA (antinuclear antibody)       Relevant Orders   Ambulatory referral to Rheumatology   Lupus anticoagulant panel( [facility]) (Completed)   Memory change       Relevant Orders   Ambulatory referral to Neurology   Ambulatory referral to Neuropsychology   Comprehensive metabolic panel (Completed)   Thyroid Panel With TSH (Completed)        No orders of the defined types were placed in this encounter.   IAnn Held, DO, personally preformed the services described in this documentation.  All medical record entries made by the scribe were at my direction and in my presence.  I have reviewed the chart and discharge instructions (if applicable) and agree that the record reflects my personal performance and is accurate and complete. 01/23/2022   I,Emily Osborne,acting as a scribe for [name], DO.,have documented all relevant documentation on the behalf of [name], DO,as directed by  [name], DO while in the presence of [name], DO.   [name], DO

## 2022-01-24 LAB — COMPREHENSIVE METABOLIC PANEL
ALT: 38 U/L — ABNORMAL HIGH (ref 0–35)
AST: 31 U/L (ref 0–37)
Albumin: 3.8 g/dL (ref 3.5–5.2)
Alkaline Phosphatase: 57 U/L (ref 39–117)
BUN: 16 mg/dL (ref 6–23)
CO2: 30 mEq/L (ref 19–32)
Calcium: 9.6 mg/dL (ref 8.4–10.5)
Chloride: 100 mEq/L (ref 96–112)
Creatinine, Ser: 0.73 mg/dL (ref 0.40–1.20)
GFR: 88.39 mL/min (ref >60.00)
Glucose, Bld: 99 mg/dL (ref 70–99)
Potassium: 3.7 mEq/L (ref 3.5–5.1)
Sodium: 139 mEq/L (ref 135–145)
Total Bilirubin: 0.5 mg/dL (ref 0.2–1.2)
Total Protein: 6.8 g/dL (ref 6.0–8.3)

## 2022-01-24 LAB — LUPUS ANTICOAGULANT PANEL
Dilute Viper Venom Time: 33.8 s (ref 0.0–47.0)
PTT Lupus Anticoagulant: 32.1 s (ref 0.0–43.5)

## 2022-01-24 LAB — THYROID PANEL WITH TSH
Free Thyroxine Index: 10 — ABNORMAL HIGH (ref 1.4–3.8)
T3 Uptake: 40 % — ABNORMAL HIGH (ref 22–35)
T4, Total: 24.9 ug/dL — ABNORMAL HIGH (ref 5.1–11.9)
TSH: <0.01 mIU/L — ABNORMAL LOW (ref 0.40–4.50)

## 2022-01-25 ENCOUNTER — Telehealth: Payer: Self-pay | Admitting: Family Medicine

## 2022-01-25 ENCOUNTER — Other Ambulatory Visit: Payer: Self-pay

## 2022-01-25 ENCOUNTER — Encounter: Payer: Self-pay | Admitting: Family Medicine

## 2022-01-25 DIAGNOSIS — E0590 Thyrotoxicosis, unspecified without thyrotoxic crisis or storm: Secondary | ICD-10-CM

## 2022-01-25 NOTE — Telephone Encounter (Signed)
Pt viewed her labs on mychart and would like a new referral to her old endo dr, Dr.Gherghe.

## 2022-01-25 NOTE — Telephone Encounter (Signed)
Labs have not been reviewed yet but [facility]

## 2022-01-26 ENCOUNTER — Other Ambulatory Visit: Payer: Self-pay | Admitting: Family Medicine

## 2022-01-26 ENCOUNTER — Other Ambulatory Visit (HOSPITAL_COMMUNITY): Payer: Self-pay

## 2022-01-26 LAB — QUANTIFERON-TB GOLD PLUS
Mitogen-NIL: 5.37 IU/mL
NIL: 0.03 IU/mL
QuantiFERON-TB Gold Plus: NEGATIVE
TB1-NIL: <0 IU/mL
TB2-NIL: 0 IU/mL

## 2022-01-27 ENCOUNTER — Encounter: Payer: Self-pay | Admitting: Family Medicine

## 2022-01-29 ENCOUNTER — Other Ambulatory Visit (HOSPITAL_COMMUNITY): Payer: Self-pay

## 2022-01-29 ENCOUNTER — Ambulatory Visit (INDEPENDENT_AMBULATORY_CARE_PROVIDER_SITE_OTHER): Payer: No Typology Code available for payment source | Admitting: Physical Medicine and Rehabilitation

## 2022-01-29 ENCOUNTER — Other Ambulatory Visit: Payer: Self-pay | Admitting: Family Medicine

## 2022-01-29 ENCOUNTER — Other Ambulatory Visit (HOSPITAL_BASED_OUTPATIENT_CLINIC_OR_DEPARTMENT_OTHER): Payer: Self-pay

## 2022-01-29 ENCOUNTER — Encounter: Payer: Self-pay | Admitting: Physical Medicine and Rehabilitation

## 2022-01-29 ENCOUNTER — Ambulatory Visit: Payer: Self-pay

## 2022-01-29 DIAGNOSIS — M25512 Pain in left shoulder: Secondary | ICD-10-CM

## 2022-01-29 DIAGNOSIS — G8929 Other chronic pain: Secondary | ICD-10-CM

## 2022-01-29 MED ORDER — TRIAMCINOLONE ACETONIDE 40 MG/ML IJ SUSP
40.0000 mg | INTRAMUSCULAR | Status: AC | PRN
  Administered 2022-01-29: 40 mg via INTRA_ARTICULAR

## 2022-01-29 MED ORDER — BUPIVACAINE HCL 0.25 % IJ SOLN
5.0000 mL | INTRAMUSCULAR | Status: AC | PRN
  Administered 2022-01-29: 5 mL via INTRA_ARTICULAR

## 2022-01-29 MED ORDER — LIDOCAINE 5 % EX PTCH
MEDICATED_PATCH | CUTANEOUS | 0 refills | Status: DC
  Filled 2022-01-29: qty 30, 30d supply, fill #0

## 2022-01-29 NOTE — Progress Notes (Signed)
Pt state left shoulder pain. Pt state any movement with her arms makes the pain worse. Pt state she takes over the counter pin meds to help ease her pain.  Numeric Pain Rating Scale and Functional Assessment Average Pain 4   In the last MONTH (on 0-10 scale) has pain interfered with the following?  1. General activity like being  able to carry out your everyday physical activities such as walking, climbing stairs, carrying groceries, or moving a chair?  Rating(10)   -BT, -Dye Allergies.

## 2022-01-29 NOTE — Progress Notes (Signed)
   Emily Osborne - 61 y.o. female MRN [id number]  Date of birth: 04-07-1961  Office Visit Note: Visit Date: 01/29/2022 PCP: [name], DO Referred by: Donella Stade, PA-C  Subjective: Chief Complaint  Patient presents with   Left Shoulder - Pain   HPI:  Emily Osborne is a 61 y.o. female who comes in today at the request of Annie Main, PA-C for planned Left anesthetic glenohumeral arthrogram with fluoroscopic guidance.  The patient has failed conservative care including home exercise, medications, time and activity modification.  This injection will be diagnostic and hopefully therapeutic.  Please see requesting physician notes for further details and justification.    ROS Otherwise per HPI.  Assessment & Plan: Visit Diagnoses:    ICD-10-CM   1. Chronic left shoulder pain  M25.512 XR C-ARM NO REPORT   G89.29       Plan: No additional findings.   Meds & Orders: No orders of the defined types were placed in this encounter.   Orders Placed This Encounter  Procedures   Large Joint Inj   XR C-ARM NO REPORT    Follow-up: Return if symptoms worsen or fail to improve.   Procedures: Large Joint Inj: L glenohumeral on 01/29/2022 9:26 AM Indications: pain and diagnostic evaluation Details: 22 G 3.5 in needle, fluoroscopy-guided anteromedial approach  Arthrogram: No  Medications: 40 mg triamcinolone acetonide 40 MG/ML; 5 mL bupivacaine 0.25 % Outcome: tolerated well, no immediate complications  There was excellent flow of contrast producing a partial arthrogram of the glenohumeral joint. The patient did have relief of symptoms during the anesthetic phase of the injection. Procedure, treatment alternatives, risks and benefits explained, specific risks discussed. Consent was given by the patient. Immediately prior to procedure a time out was called to verify the correct patient, procedure, equipment, support staff and site/side marked as required. Patient was  prepped and draped in the usual sterile fashion.          Clinical History: No specialty comments available.     Objective:  VS:  HT:    WT:   BMI:     BP:   HR: bpm  TEMP: ( )  RESP:  Physical Exam   Imaging: No results found.

## 2022-01-30 ENCOUNTER — Telehealth: Payer: Self-pay | Admitting: Family Medicine

## 2022-01-30 NOTE — Telephone Encounter (Signed)
Pt called stating that the referral that was made to [facility] needs to be changed due to the office telling her that she will not be able to be seen until October. Pt was wondering if there were any other places Dr. Etter Sjogren would recommend for her to go see so that she can have her concerns addressed.

## 2022-01-31 ENCOUNTER — Encounter: Payer: Self-pay | Admitting: Gastroenterology

## 2022-01-31 ENCOUNTER — Encounter: Payer: Self-pay | Admitting: Family Medicine

## 2022-01-31 NOTE — Telephone Encounter (Signed)
Pt called. VM full and unable to leave vm

## 2022-02-01 ENCOUNTER — Other Ambulatory Visit (HOSPITAL_COMMUNITY): Payer: Self-pay

## 2022-02-01 ENCOUNTER — Telehealth: Payer: No Typology Code available for payment source | Admitting: Physician Assistant

## 2022-02-01 VITALS — HR 114

## 2022-02-01 DIAGNOSIS — E0590 Thyrotoxicosis, unspecified without thyrotoxic crisis or storm: Secondary | ICD-10-CM

## 2022-02-01 MED ORDER — ATENOLOL 25 MG PO TABS
12.5000 mg | ORAL_TABLET | Freq: Every day | ORAL | 0 refills | Status: DC
  Filled 2022-02-01: qty 15, 30d supply, fill #0

## 2022-02-01 NOTE — Progress Notes (Signed)
Virtual Visit Consent   Emily Osborne, you are scheduled for a virtual visit with a [place] provider today. Just as with appointments in the office, your consent must be obtained to participate. Your consent will be active for this visit and any virtual visit you may have with one of our providers in the next 365 days. If you have a MyChart account, a copy of this consent can be sent to you electronically.  As this is a virtual visit, video technology does not allow for your provider to perform a traditional examination. This may limit your provider's ability to fully assess your condition. If your provider identifies any concerns that need to be evaluated in person or the need to arrange testing (such as labs, EKG, etc.), we will make arrangements to do so. Although advances in technology are sophisticated, we cannot ensure that it will always work on either your end or our end. If the connection with a video visit is poor, the visit may have to be switched to a telephone visit. With either a video or telephone visit, we are not always able to ensure that we have a secure connection.  By engaging in this virtual visit, you consent to the provision of healthcare and authorize for your insurance to be billed (if applicable) for the services provided during this visit. Depending on your insurance coverage, you may receive a charge related to this service.  I need to obtain your verbal consent now. Are you willing to proceed with your visit today? Emily Osborne has provided verbal consent on 02/01/2022 for a virtual visit (video or telephone). Leeanne Rio, [place]  Date: 02/01/2022 11:49 AM  Virtual Visit via Video Note   I, Leeanne Rio, connected with  Emily Osborne  ([id number], [birth year]) on 02/01/22 at 11:30 AM EDT by a video-enabled telemedicine application and verified that I am speaking with the correct person using two identifiers.  Location: Patient: Virtual Visit  Location Patient: Home Provider: Virtual Visit Location Provider: Home Office   I discussed the limitations of evaluation and management by telemedicine and the availability of in person appointments. The patient expressed understanding and agreed to proceed.    History of Present Illness: Emily Osborne is a 61 y.o. who identifies as a female who was assigned female at birth, and is being seen today for hyperthyroid state, recently noted on labs with PCP (6/6). Endo referrals in the works but cannot get her in until the fall. She notes since her visit with PCP she is having more anxiety, jitteriness, fast heart rate (resting 110s now), and some tremor of hands. Denies chest pain, AMS, SOB. Previously on Methimazole and Atenolol for hyperthyroidism but was weaned off of medication some time ago with normal thyroid levels until recently. Since she had not seen her previous Endocrinologist in several years, she could not get an appointment with them.  HPI: HPI  Problems:  Patient Active Problem List   Diagnosis Date Noted   Diet-controlled diabetes mellitus ([place]) 12/11/2021   Swelling 02/02/2021   Right lower quadrant abdominal pain 02/02/2021   History of COVID-19 10/17/2020   Shortness of breath 10/17/2020   COVID-19 10/12/2020   Dizziness 05/23/2020   Primary hypertension 06/11/2019   New daily persistent headache 06/11/2019   Memory loss 06/11/2019   Upper airway cough syndrome vs cough variant asthma 01/01/2019   Chest pain 12/09/2018   Prediabetes 10/07/2018   Preventative health care 08/07/2018   Anxiety 10/11/2017  S/P partial colectomy 06/18/2017   Multiple drug allergies 06/18/2017   Colitis 02/05/2017   Diarrhea 02/05/2017   Diverticular disease 48/25/0037   Helicobacter pylori (H. pylori) infection 12/13/2016   Routine screening for STI (sexually transmitted infection) 12/13/2016   Clostridium difficile colitis 12/13/2016   Diverticulitis 10/10/2016   Lower abdominal  pain 09/29/2016   Palpitations 04/88/8916   Mild diastolic dysfunction 94/50/3888   Graves disease 01/02/2016   Acute diverticulitis 12/07/2015   Acute bacterial sinusitis 10/04/2015   History of colonic polyps 11/15/2014   Left shoulder pain 09/28/2013   Left-sided weakness 09/16/2013   Obesity (BMI 30-39.9) with low erv on pfts 09/25/19 09/02/2013   Myalgia 06/21/2011   POSTMENOPAUSAL STATUS 09/07/2010   PAIN IN JOINT, MULTIPLE SITES 12/27/2009   LOW BACK PAIN, CHRONIC 12/27/2009   FATIGUE 12/27/2009   VAGINITIS 11/22/2009   DYSPEPSIA&OTHER SPEC DISORDERS FUNCTION STOMACH 09/01/2009   NAUSEA 08/03/2009   FLATULENCE-GAS-BLOATING 08/03/2009   ABDOMINAL PAIN RIGHT UPPER QUADRANT 07/06/2009   SUPRAPUBIC PAIN 07/06/2009   GANGLION CYST, HX OF 07/06/2009   BACK PAIN 05/04/2009   Hyperlipidemia 09/16/2008   UNSPECIFIED MYALGIA AND MYOSITIS 09/16/2008   Precordial pain 07/27/2008   GERD 28/00/3491   HELICOBACTER PYLORI GASTRITIS, HX OF 04/05/2008   OTHER ACUTE REACTIONS TO STRESS 03/16/2008   ABDOMINAL PAIN, EPIGASTRIC 03/16/2008   SHOULDER PAIN, RIGHT 10/01/2007   ELBOW PAIN, RIGHT 10/01/2007   Pain in limb 04/14/2007   DEPENDENT EDEMA, RIGHT LEG 04/14/2007   SHINGLES 04/08/2007   Asthma 03/18/2007   GANGLION CYST, WRIST, RIGHT 03/18/2007   BUNIONECTOMY, HX OF 03/18/2007    Allergies:  Allergies  Allergen Reactions   Aspirin Other (See Comments)    REACTION: anaphylaxis   Gentamicin Other (See Comments)    Eye drops turned the sclera bright red   Ibuprofen Other (See Comments)    REACTION: anaphylaxsis   Metronidazole Swelling    REACTION: red face/swelling   Nsaids Other (See Comments)    REACTION: anaphylaxis   Doxycycline Other (See Comments)    REACTION: severe nausea/vomiting   Influenza A (H1n1) [place] Other (See Comments)    REACTION: sick for 3 weeks   Loratadine Other (See Comments)    Fatigue/weakness   Metformin And Related Other (See Comments)     Memory issues and headache   Periactin [Cyproheptadine] Other (See Comments)    paranoid    Ranitidine Hcl Other (See Comments)    REACTION: Lips turned red /peel   Sulfamethoxazole-Trimethoprim Other (See Comments)    REACTION: face red/peel   Tramadol Hcl Other (See Comments)    REACTION: paranoid   Medications:  Current Outpatient Medications:    atenolol (TENORMIN) 25 MG tablet, Take 1/2 tablet (12.5 mg total) by mouth daily., Disp: 15 tablet, Rfl: 0   acetaminophen (TYLENOL) 500 MG tablet, Take 1,000 mg by mouth every 6 (six) hours as needed for moderate pain., Disp: , Rfl:    albuterol (PROAIR HFA) 108 (90 Base) MCG/ACT inhaler, USE 2 PUFFS EVERY 4 HOURS AS NEEDED FOR COUGH, WHEEZE OR SHORTNESS OF BREATH., Disp: 8.5 each, Rfl: 2   ALPRAZolam (XANAX) 0.25 MG tablet, TAKE 1 TABLET BY MOUTH THREE TIMES A DAY AS NEEDED, Disp: 30 tablet, Rfl: 2   AMBULATORY NON FORMULARY MEDICATION, Medication Name: Critic-Aid Dentist  Apply to affected area as needed, Disp: 30 g, Rfl: 11   azelastine (OPTIVAR) 0.05 % ophthalmic solution, Place 1 drop into both eyes 2 (  two) times daily as needed., Disp: , Rfl:    Blood Glucose Monitoring Suppl (FREESTYLE FREEDOM) KIT, 1 Device by Does not apply route daily., Disp: 1 each, Rfl: 0   budesonide-formoterol (SYMBICORT) 80-4.5 MCG/ACT inhaler, INHALE 2 PUFFS BY MOUTH FIRST THING IN THE MORNING AND THEN ANOTHER 2 PUFFS ABOUT 12 HOURS LATER, Disp: 10.2 g, Rfl: 4   dexlansoprazole (DEXILANT) 60 MG capsule, Take 1 capsule (60 mg total) by mouth daily., Disp: 90 capsule, Rfl: 2   diclofenac sodium (VOLTAREN) 1 % GEL, Apply 2 g topically 4 (four) times daily. Rub into affected area of foot 2 to 4 times daily, Disp: 100 g, Rfl: 2   famotidine (PEPCID) 20 MG tablet, One after supper, Disp: 30 tablet, Rfl: 11   fluticasone (FLONASE) 50 MCG/ACT nasal spray, USE 2 SPRAYS INTO BOTH NOSTRILS DAILY., Disp: 16 g, Rfl: 5    glucose blood (FREESTYLE LITE) test strip, USE TO CHECK BLOOD SUGAR ONCE A DAY, Disp: 100 each, Rfl: 1   hydrochlorothiazide (HYDRODIURIL) 25 MG tablet, Take 1 tablet (25 mg total) by mouth daily., Disp: 90 tablet, Rfl: 3   Iron Combinations (I.L.X. B-12) ELIX, Take 1 each by mouth daily as needed (energy). 1 droplet full, Disp: , Rfl:    levocetirizine (XYZAL) 5 MG tablet, TAKE 1 TABLET BY MOUTH EVERY EVENING., Disp: 90 tablet, Rfl: 3   lidocaine (LIDODERM) 5 %, Apply to the affected area once daily as needed for a week. **Remove patch after 12 hours**, Disp: 30 patch, Rfl: 0   Probiotic Product (ALIGN) 4 MG CAPS, Take 1 capsule (4 mg total) by mouth daily., Disp: 90 capsule, Rfl: 3   sucralfate (CARAFATE) 1 g tablet, Take 1 tablet by mouth every 6 hours as needed., Disp: 60 tablet, Rfl: 3   valACYclovir (VALTREX) 1000 MG tablet, Take 1 tablet (1,000 mg total) by mouth 3 (three) times daily as needed., Disp: 30 tablet, Rfl: 2  Observations/Objective: Patient is well-developed, well-nourished in no acute distress.  Resting comfortably at work.  Head is normocephalic, atraumatic.  No labored breathing. Speech is clear and coherent with logical content.  Patient is alert and oriented at baseline.   Assessment and Plan: 1. Hyperthyroidism - atenolol (TENORMIN) 25 MG tablet; Take 1/2 tablet (12.5 mg total) by mouth daily.  Dispense: 15 tablet; Refill: 0  Needs follow-up with PCP and Endo. Discussed we cannot manage this via virtual urgent care platform. Giving symptoms will start her on prior dose of Atenolol until she can follow-up with PCP early next week. Strict ER precautions reviewed. Message sent directly to PCP about change in patient's symptoms and issue with Endo appointments to just make doubly sure she is aware.   Follow Up Instructions: I discussed the assessment and treatment plan with the patient. The patient was provided an opportunity to ask questions and all were answered. The  patient agreed with the plan and demonstrated an understanding of the instructions.  A copy of instructions were sent to the patient via MyChart unless otherwise noted below.   The patient was advised to call back or seek an in-person evaluation if the symptoms worsen or if the condition fails to improve as anticipated.  Time:  I spent 15 minutes with the patient via telehealth technology discussing the above problems/concerns.    Leeanne Rio, PA-C

## 2022-02-01 NOTE — Patient Instructions (Signed)
Rudene Christians, thank you for joining Leeanne Rio, PA-C for today's virtual visit.  While this provider is not your primary care provider (PCP), if your PCP is located in our provider database this encounter information will be shared with them immediately following your visit.  Consent: (Patient) Emily Osborne provided verbal consent for this virtual visit at the beginning of the encounter.  Current Medications:  Current Outpatient Medications:    atenolol (TENORMIN) 25 MG tablet, Take 0.5 tablets (12.5 mg total) by mouth daily., Disp: 15 tablet, Rfl: 0   acetaminophen (TYLENOL) 500 MG tablet, Take 1,000 mg by mouth every 6 (six) hours as needed for moderate pain., Disp: , Rfl:    albuterol (PROAIR HFA) 108 (90 Base) MCG/ACT inhaler, USE 2 PUFFS EVERY 4 HOURS AS NEEDED FOR COUGH, WHEEZE OR SHORTNESS OF BREATH., Disp: 8.5 each, Rfl: 2   ALPRAZolam (XANAX) 0.25 MG tablet, TAKE 1 TABLET BY MOUTH THREE TIMES A DAY AS NEEDED, Disp: 30 tablet, Rfl: 2   AMBULATORY NON FORMULARY MEDICATION, Medication Name: Critic-Aid Dentist  Apply to affected area as needed, Disp: 30 g, Rfl: 11   azelastine (OPTIVAR) 0.05 % ophthalmic solution, Place 1 drop into both eyes 2 (two) times daily as needed., Disp: , Rfl:    Blood Glucose Monitoring Suppl (FREESTYLE FREEDOM) KIT, 1 Device by Does not apply route daily., Disp: 1 each, Rfl: 0   budesonide-formoterol (SYMBICORT) 80-4.5 MCG/ACT inhaler, INHALE 2 PUFFS BY MOUTH FIRST THING IN THE MORNING AND THEN ANOTHER 2 PUFFS ABOUT 12 HOURS LATER, Disp: 10.2 g, Rfl: 4   dexlansoprazole (DEXILANT) 60 MG capsule, Take 1 capsule (60 mg total) by mouth daily., Disp: 90 capsule, Rfl: 2   diclofenac sodium (VOLTAREN) 1 % GEL, Apply 2 g topically 4 (four) times daily. Rub into affected area of foot 2 to 4 times daily, Disp: 100 g, Rfl: 2   famotidine (PEPCID) 20 MG tablet, One after supper, Disp: 30 tablet, Rfl: 11    fluticasone (FLONASE) 50 MCG/ACT nasal spray, USE 2 SPRAYS INTO BOTH NOSTRILS DAILY., Disp: 16 g, Rfl: 5   glucose blood (FREESTYLE LITE) test strip, USE TO CHECK BLOOD SUGAR ONCE A DAY, Disp: 100 each, Rfl: 1   hydrochlorothiazide (HYDRODIURIL) 25 MG tablet, Take 1 tablet (25 mg total) by mouth daily., Disp: 90 tablet, Rfl: 3   Iron Combinations (I.L.X. B-12) ELIX, Take 1 each by mouth daily as needed (energy). 1 droplet full, Disp: , Rfl:    levocetirizine (XYZAL) 5 MG tablet, TAKE 1 TABLET BY MOUTH EVERY EVENING., Disp: 90 tablet, Rfl: 3   lidocaine (LIDODERM) 5 %, Apply to the affected area once daily as needed for a week. **Remove patch after 12 hours**, Disp: 30 patch, Rfl: 0   Probiotic Product (ALIGN) 4 MG CAPS, Take 1 capsule (4 mg total) by mouth daily., Disp: 90 capsule, Rfl: 3   sucralfate (CARAFATE) 1 g tablet, Take 1 tablet by mouth every 6 hours as needed., Disp: 60 tablet, Rfl: 3   valACYclovir (VALTREX) 1000 MG tablet, Take 1 tablet (1,000 mg total) by mouth 3 (three) times daily as needed., Disp: 30 tablet, Rfl: 2   Medications ordered in this encounter:  Meds ordered this encounter  Medications   atenolol (TENORMIN) 25 MG tablet    Sig: Take 0.5 tablets (12.5 mg total) by mouth daily.    Dispense:  15 tablet    Refill:  0    Order  Specific Question:   Supervising Provider    Answer:   Noemi Chapel [[id number]]     *If you need refills on other medications prior to your next appointment, please contact your pharmacy*  Follow-Up: Call back or seek an in-person evaluation if the symptoms worsen or if the condition fails to improve as anticipated.  Other Instructions Start the Atenolol as directed. Avoid caffeine and alcohol. Try to keep well-hydrated.  Call Dr. Nonda Lou office first thing tomorrow morning to schedule a follow-up appt early next week so she can reassess your heart rate, etc and make adjustments if needed while working on getting you in with the specialist  ASAP.   If anything significantly worsens, you note any chest pain or confusion, HR continuing to climb despite medication, etc., especially before follow-up next week -- ER. DO NOT DELAY CARE.    If you have been instructed to have an in-person evaluation today at a local Urgent Care facility, please use the link below. It will take you to a list of all of our available [facility], including address, phone number and hours of operation. Please do not delay care.  [facility]  If you or a family member do not have a primary care provider, use the link below to schedule a visit and establish care. When you choose a [facility] primary care physician or advanced practice provider, you gain a long-term partner in health. Find a Primary Care Provider  Learn more about 's in-office and virtual care options: [facility]

## 2022-02-02 NOTE — Telephone Encounter (Signed)
Pt called- stated that she called Dr. Almetta Lovely office- they do not have the referral- please fax it to ATTN: [facility]- [phone])

## 2022-02-05 ENCOUNTER — Telehealth: Payer: Self-pay | Admitting: Family Medicine

## 2022-02-05 ENCOUNTER — Other Ambulatory Visit: Payer: No Typology Code available for payment source

## 2022-02-05 ENCOUNTER — Encounter: Payer: Self-pay | Admitting: Physician Assistant

## 2022-02-05 ENCOUNTER — Other Ambulatory Visit (HOSPITAL_COMMUNITY): Payer: Self-pay

## 2022-02-05 ENCOUNTER — Ambulatory Visit: Payer: No Typology Code available for payment source | Admitting: Physician Assistant

## 2022-02-05 VITALS — BP 130/76 | HR 98 | Resp 17 | Ht 65.0 in | Wt 224.0 lb

## 2022-02-05 DIAGNOSIS — R413 Other amnesia: Secondary | ICD-10-CM

## 2022-02-05 LAB — VITAMIN B12: Vitamin B-12: 1300 pg/mL — ABNORMAL HIGH (ref 211–911)

## 2022-02-05 NOTE — Patient Instructions (Addendum)
It was a pleasure to see you today at our office.   Recommendations:  Neurocognitive evaluation at our office MRI of the brain, the radiology office will call you to arrange you appointment Check labs today Needs immediate thyroid care Follow up in 1 month  Whom to call:  Memory  decline, memory medications: Call our office [phone]   For psychiatric meds, mood meds: Please have your primary care physician manage these medications.   Counseling regarding caregiver distress, including caregiver depression, anxiety and issues regarding community resources, adult day care programs, adult living facilities, or memory care questions:   Feel free to contact [place], Social Worker at [phone]   For assessment of decision of mental capacity and competency:  Call Dr. Anthoney Harada, geriatric psychiatrist at [phone]  For guidance in geriatric dementia issues please call [facility] [phone]  For guidance regarding [facility] and if placement were needed at the facility, contact Arnell Asal, Social Worker tel: [phone]  If you have any severe symptoms of a stroke, or other severe issues such as confusion,severe chills or fever, etc call 911 or go to the ER as you may need to be evaluated further       RECOMMENDATIONS FOR ALL PATIENTS WITH MEMORY PROBLEMS: 1. Continue to exercise (Recommend 30 minutes of walking everyday, or 3 hours every week) 2. Increase social interactions - continue going to [place] and enjoy social gatherings with friends and family 3. Eat healthy, avoid fried foods and eat more fruits and vegetables 4. Maintain adequate blood pressure, blood sugar, and blood cholesterol level. Reducing the risk of stroke and cardiovascular disease also helps promoting better memory. 5. Avoid stressful situations. Live a simple life and avoid aggravations. Organize your time and prepare for the next day in  anticipation. 6. Sleep well, avoid any interruptions of sleep and avoid any distractions in the bedroom that may interfere with adequate sleep quality 7. Avoid sugar, avoid sweets as there is a strong link between excessive sugar intake, diabetes, and cognitive impairment We discussed the Mediterranean diet, which has been shown to help patients reduce the risk of progressive memory disorders and reduces cardiovascular risk. This includes eating fish, eat fruits and green leafy vegetables, nuts like almonds and hazelnuts, walnuts, and also use olive oil. Avoid fast foods and fried foods as much as possible. Avoid sweets and sugar as sugar use has been linked to worsening of memory function.  There is always a concern of gradual progression of memory problems. If this is the case, then we may need to adjust level of care according to patient needs. Support, both to the patient and caregiver, should then be put into place.      You have been referred for a neuropsychological evaluation (i.e., evaluation of memory and thinking abilities). Please bring someone with you to this appointment if possible, as it is helpful for the doctor to hear from both you and another adult who knows you well. Please bring eyeglasses and hearing aids if you wear them.    The evaluation will take approximately 3 hours and has two parts:   The first part is a clinical interview with the neuropsychologist (Dr. Melvyn Novas or Dr. Nicole Kindred). During the interview, the neuropsychologist will speak with you and the individual you brought to the appointment.    The second part of the evaluation is testing with the doctor's technician Hinton Dyer or Maudie Mercury). During the testing, the technician will ask you to remember  different types of material, solve problems, and answer some questionnaires. Your family member will not be present for this portion of the evaluation.   Please note: We must reserve several hours of the neuropsychologist's time and  the psychometrician's time for your evaluation appointment. As such, there is a No-Show fee of $100. If you are unable to attend any of your appointments, please contact our office as soon as possible to reschedule.    FALL PRECAUTIONS: Be cautious when walking. Scan the area for obstacles that may increase the risk of trips and falls. When getting up in the mornings, sit up at the edge of the bed for a few minutes before getting out of bed. Consider elevating the bed at the head end to avoid drop of blood pressure when getting up. Walk always in a well-lit room (use night lights in the walls). Avoid area rugs or power cords from appliances in the middle of the walkways. Use a walker or a cane if necessary and consider physical therapy for balance exercise. Get your eyesight checked regularly.  FINANCIAL OVERSIGHT: Supervision, especially oversight when making financial decisions or transactions is also recommended.  HOME SAFETY: Consider the safety of the kitchen when operating appliances like stoves, microwave oven, and blender. Consider having supervision and share cooking responsibilities until no longer able to participate in those. Accidents with firearms and other hazards in the house should be identified and addressed as well.   ABILITY TO BE LEFT ALONE: If patient is unable to contact 911 operator, consider using LifeLine, or when the need is there, arrange for someone to stay with patients. Smoking is a fire hazard, consider supervision or cessation. Risk of wandering should be assessed by caregiver and if detected at any point, supervision and safe proof recommendations should be instituted.  MEDICATION SUPERVISION: Inability to self-administer medication needs to be constantly addressed. Implement a mechanism to ensure safe administration of the medications.   DRIVING: Regarding driving, in patients with progressive memory problems, driving will be impaired. We advise to have someone else  do the driving if trouble finding directions or if minor accidents are reported. Independent driving assessment is available to determine safety of driving.   If you are interested in the driving assessment, you can contact the following:  [employer] in [place]  [place] [phone] or [phone]    [place] refers to food and lifestyle choices that are based on the traditions of countries located on the [employer]. This way of eating has been shown to help prevent certain conditions and improve outcomes for people who have chronic diseases, like kidney disease and heart disease. What are tips for following this plan? Lifestyle  Cook and eat meals together with your family, when possible. Drink enough fluid to keep your urine clear or pale yellow. Be physically active every day. This includes: Aerobic exercise like running or swimming. Leisure activities like gardening, walking, or housework. Get 7-8 hours of sleep each night. If recommended by your health care provider, drink red wine in moderation. This means 1 glass a day for nonpregnant women and 2 glasses a day for men. A glass of wine equals 5 oz (150 mL). Reading food labels  Check the serving size of packaged foods. For foods such as rice and pasta, the serving size refers to the amount of cooked product, not dry. Check the total fat in packaged foods.  Avoid foods that have saturated fat or trans fats. Check the ingredients list for added sugars, such as corn syrup. Shopping  At the grocery store, buy most of your food from the areas near the walls of the store. This includes: Fresh fruits and vegetables (produce). Grains, beans, nuts, and seeds. Some of these may be available in unpackaged forms or large amounts (in bulk). Fresh seafood. Poultry and eggs. Low-fat  dairy products. Buy whole ingredients instead of prepackaged foods. Buy fresh fruits and vegetables in-season from local farmers markets. Buy frozen fruits and vegetables in resealable bags. If you do not have access to quality fresh seafood, buy precooked frozen shrimp or canned fish, such as tuna, salmon, or sardines. Buy small amounts of raw or cooked vegetables, salads, or olives from the deli or salad bar at your store. Stock your pantry so you always have certain foods on hand, such as olive oil, canned tuna, canned tomatoes, rice, pasta, and beans. Cooking  Cook foods with extra-virgin olive oil instead of using butter or other vegetable oils. Have meat as a side dish, and have vegetables or grains as your main dish. This means having meat in small portions or adding small amounts of meat to foods like pasta or stew. Use beans or vegetables instead of meat in common dishes like chili or lasagna. Experiment with different cooking methods. Try roasting or broiling vegetables instead of steaming or sauteing them. Add frozen vegetables to soups, stews, pasta, or rice. Add nuts or seeds for added healthy fat at each meal. You can add these to yogurt, salads, or vegetable dishes. Marinate fish or vegetables using olive oil, lemon juice, garlic, and fresh herbs. Meal planning  Plan to eat 1 vegetarian meal one day each week. Try to work up to 2 vegetarian meals, if possible. Eat seafood 2 or more times a week. Have healthy snacks readily available, such as: Vegetable sticks with hummus. Greek yogurt. Fruit and nut trail mix. Eat balanced meals throughout the week. This includes: Fruit: 2-3 servings a day Vegetables: 4-5 servings a day Low-fat dairy: 2 servings a day Fish, poultry, or lean meat: 1 serving a day Beans and legumes: 2 or more servings a week Nuts and seeds: 1-2 servings a day Whole grains: 6-8 servings a day Extra-virgin olive oil: 3-4 servings a day Limit red meat and  sweets to only a few servings a month What are my food choices? Mediterranean diet Recommended Grains: Whole-grain pasta. Brown rice. Bulgar wheat. Polenta. Couscous. Whole-wheat bread. [place]. Vegetables: Artichokes. Beets. Broccoli. Cabbage. Carrots. Eggplant. Green beans. Chard. Kale. Spinach. Onions. Leeks. Peas. Squash. Tomatoes. Peppers. Radishes. Fruits: Apples. Apricots. Avocado. Berries. Bananas. Cherries. Dates. Figs. Grapes. Lemons. Melon. Oranges. Peaches. Plums. Pomegranate. Meats and other protein foods: Beans. Almonds. Sunflower seeds. Pine nuts. Peanuts. [employer]. Salmon. Scallops. Shrimp. [place]. Tilapia. Clams. Oysters. Eggs. Dairy: Low-fat milk. Cheese. Greek yogurt. Beverages: Water. Red wine. Herbal tea. Fats and oils: Extra virgin olive oil. Avocado oil. Grape seed oil. Sweets and desserts: [place] yogurt with honey. Baked apples. Poached pears. Trail mix. Seasoning and other foods: Basil. Cilantro. Coriander. Cumin. Mint. Parsley. Sage. Rosemary. Tarragon. Garlic. Oregano. Thyme. Pepper. Balsalmic vinegar. Tahini. Hummus. Tomato sauce. Olives. Mushrooms. Limit these Grains: Prepackaged pasta or rice dishes. Prepackaged cereal with added sugar. Vegetables: Deep fried potatoes (french fries). Fruits: Fruit canned in syrup. Meats and other protein foods: Beef. Pork. Lamb. Poultry with skin. Hot dogs. Berniece Salines. Dairy: Ice cream. Sour cream. Whole milk. Beverages: Juice. Sugar-sweetened  soft drinks. Beer. Liquor and spirits. Fats and oils: Butter. Canola oil. Vegetable oil. Beef fat (tallow). Lard. Sweets and desserts: Cookies. Cakes. Pies. Candy. Seasoning and other foods: Mayonnaise. Premade sauces and marinades. The items listed may not be a complete list. Talk with your dietitian about what dietary choices are right for you. Summary The Mediterranean diet includes both food and lifestyle choices. Eat a variety of fresh fruits and vegetables, beans, nuts, seeds, and whole  grains. Limit the amount of red meat and sweets that you eat. Talk with your health care provider about whether it is safe for you to drink red wine in moderation. This means 1 glass a day for nonpregnant women and 2 glasses a day for men. A glass of wine equals 5 oz (150 mL). This information is not intended to replace advice given to you by your health care provider. Make sure you discuss any questions you have with your health care provider. Document Released: 03/29/2016 Document Revised: 05/01/2016 Document Reviewed: 03/29/2016 Elsevier Interactive Patient Education  2017 [employer].    We have sent a referral to [facility] for your MRI and they will call you directly to schedule your appointment. They are located at [place]. If you need to contact them directly please call [phone].  Your provider has requested that you have labwork completed today. Please go to [facility] ([id number]) on the second floor of this building before leaving the office today. You do not need to check in. If you are not called within 15 minutes please check with the front desk.

## 2022-02-05 NOTE — Telephone Encounter (Signed)
[place] [facility] is needing ov and lab notes for positive ana faxed to [phone].

## 2022-02-05 NOTE — Telephone Encounter (Signed)
Labs and office note faxed

## 2022-02-05 NOTE — Progress Notes (Signed)
Assessment/Plan:    The patient is seen in neurologic consultation at the request of [name], * for the evaluation of memory.  Emily Osborne is a very pleasant 61 y.o. year old RH female with  a history of  asthma, fibromyalgia, chronic fatigue syndrome, anxiety, hypertension, hyperthyroidism, PreDiabetes , B12 deficiency, GERD, chronic left shoulder pain, migraine variant ( R facial numbness and vision changes),  seen today for evaluation of memory loss. MoCA today is   Recommendations:   Memory Loss   MRI brain with/without contrast to assess for underlying structural abnormality and assess vascular load   A referral for Neurocognitive testing to further evaluate cognitive concerns and determine other underlying cause of memory changes, including potential contribution from sleep, anxiety, or depression has been already requested by PCP.  Check B12 Needs immediate thyroid care [place] up MRI   Subjective:    The patient is accompanied by  who supplements the history.    How long did patient have memory difficulties? 6 months, "off an on"." If I need the remote, go get me that, you know that thing that turns the TV on". "My children say "Go give me some water out of the dryer". "My words won't come out sometimes" Patient lives with:  2 daughters and a grand-baby. "One of my daughters has mental health issues, a lot of stress, one daughter with aplastic anemia  which adds a lot of stress".  repeats oneself? Endorsed. I asked them to do something and they say "you already told me"  Disoriented when walking into a room? " I know is my house but sometimes I go where I was getting ready to go?  Leaving objects in unusual places?  Patient denies   Ambulates  with difficulty?   Chronic L weakness, likely musculoskeletal due tot pain.  Recent falls?  Patient denies   Any head injuries?  Patient denies   History of seizures?   Patient denies   Wandering behavior?  Patient  denies   Patient drives?   Denies any issues.  Any mood changes? Daughters tell me I have been a little irritable.    Any history of depression?:  Patient denies   Hallucinations?  Patient denies   Paranoia?  Patient denies   Patient reports that he sleeps well without vivid dreams, REM behavior or sleepwalking  but occasionally may have some restless nights   History of sleep apnea?  Patient denies   Any hygiene concerns?  Patient denies   Independent of bathing and dressing?  Endorsed  Does the patient needs help with medications? Denies  Who is in charge of the finances?  Patient is in charge    Patient have trouble swallowing? Patient denies   Does the patient cook?  Patient denies   Any kitchen accidents such as leaving the stove on? Patient denies   Any headaches?  Patient denies , controlled with tylenol  The double vision? Patient denies   Any focal numbness or tingling?  Patient denies chronic problems, occasionally has L hand tingling (shoulder ) Chronic back pain Patient denies   Unilateral weakness?  Patient denies   Any tremors?  Endorsed (shaking inside, due to thyroid issues)  Any history of anosmia? Endorsed, over the last 6 weeks   Any incontinence of urine? Endorsed, wears pads  Any bowel dysfunction?   Diarrhea  History of heavy alcohol intake?  Patient denies   History of heavy tobacco use?  Patient denies  Family history of dementia?  Patient denies     MRI brain wo contrast 06/03/2020: 1. No acute intracranial abnormality. 2. Stable since 2017 and largely unremarkable for age noncontrast MRI appearance of the brain. Mild nonspecific white matter changes. Mild intracranial artery tortuosity.   EEG 06/03/2020: 1) Rare theta slow activity in the left frontotemporal region.  Clinical Interpretation: This EEG is suggestive of a nonspecific focal dysfunction in the left temporal region. This is similar to what was described with her previous EEG in 2015.    EMG  left upper extremity 12/03/2013:  This is a normal study of the left upper extremity. In particular, there is no evidence of cervical radiculopathy or carpal tunnel syndrome.   CT/A head and neck 11/03/2013:   Normal CT of the head.  Normal CTA of the head and neck. No evidence of atherosclerotic  disease or dissection.   MRI cervical spine wo contrast 10/29/2013:  Minimal degenerative changes in the cervical spine, stable. No neural impingement.   MRI shoulder 09/25/2013: 1. Degenerative glenohumeral arthropathy with chondral thinning and multiple degenerative subcortical cystic lesions along the glenoid.  2. Mild blunting of the anterior and posterior labrum, but without a well-defined tear.  3. Mild rotator cuff tendinopathy, with a partial-thickness intrasubstance insertional tear of the distal posterior supraspinatus.   MRI brain 09/17/2013: 1. Periventricular and subcortical white matter disease is advanced for age. The finding is nonspecific but can be seen in the setting of chronic microvascular ischemia, a demyelinating process such as multiple sclerosis, vasculitis, complicated migraine headaches, or as the sequelae of a prior infectious or inflammatory process.  2. No acute intracranial abnormality.  3. Right greater than left maxillary sinus disease.   Routine EEG 12/22/2013:  This awake and asleep EEG is mildly abnormal due to rare focal slowing over the left temporal region.   CT/A head and neck 11/03/2013:  Normal CT of the head.  Normal CTA of the head and neck. No evidence of atherosclerotic  disease or dissection.   MRI brain 11/02/2015:  Mild chronic white matter changes stable since 2015. No acute abnormality. Allergies  Allergen Reactions   Aspirin Other (See Comments)    REACTION: anaphylaxis   Gentamicin Other (See Comments)    Eye drops turned the sclera bright red   Ibuprofen Other (See Comments)    REACTION: anaphylaxsis   Metronidazole Swelling    REACTION: red  face/swelling   Nsaids Other (See Comments)    REACTION: anaphylaxis   Doxycycline Other (See Comments)    REACTION: severe nausea/vomiting   Influenza A (H1n1) [place] Vac Other (See Comments)    REACTION: sick for 3 weeks   Loratadine Other (See Comments)    Fatigue/weakness   Metformin And Related Other (See Comments)    Memory issues and headache   Periactin [Cyproheptadine] Other (See Comments)    paranoid    Ranitidine Hcl Other (See Comments)    REACTION: Lips turned red /peel   Sulfamethoxazole-Trimethoprim Other (See Comments)    REACTION: face red/peel   Tramadol Hcl Other (See Comments)    REACTION: paranoid    Current Outpatient Medications  Medication Instructions   acetaminophen (TYLENOL) 1,000 mg, Oral, Every 6 hours PRN,     albuterol (PROAIR HFA) 108 (90 Base) MCG/ACT inhaler USE 2 PUFFS EVERY 4 HOURS AS NEEDED FOR COUGH, WHEEZE OR SHORTNESS OF BREATH.   Align 4 mg, Oral, Daily   ALPRAZolam (XANAX) 0.25 MG tablet TAKE 1 TABLET BY MOUTH THREE  TIMES A DAY AS NEEDED   AMBULATORY NON FORMULARY MEDICATION Medication Name: Critic-Aid Clear-Clear Moisture Barrier Ointment <BR>Manufacturer Coloplast<BR><BR>Apply to affected area as needed   atenolol (TENORMIN) 25 MG tablet Take 1/2 tablet (12.5 mg total) by mouth daily.   azelastine (OPTIVAR) 0.05 % ophthalmic solution 1 drop, Both Eyes, 2 times daily PRN   Blood Glucose Monitoring Suppl (FREESTYLE FREEDOM) KIT 1 Device, Does not apply, Daily   budesonide-formoterol (SYMBICORT) 80-4.5 MCG/ACT inhaler INHALE 2 PUFFS BY MOUTH FIRST THING IN THE MORNING AND THEN ANOTHER 2 PUFFS ABOUT 12 HOURS LATER   dexlansoprazole (DEXILANT) 60 mg, Oral, Daily   diclofenac sodium (VOLTAREN) 2 g, Topical, 4 times daily, Rub into affected area of foot 2 to 4 times daily   famotidine (PEPCID) 20 MG tablet One after supper   fluticasone (FLONASE) 50 MCG/ACT nasal spray USE 2 SPRAYS INTO BOTH NOSTRILS DAILY.   glucose blood (FREESTYLE LITE)  test strip USE TO CHECK BLOOD SUGAR ONCE A DAY   hydrochlorothiazide (HYDRODIURIL) 25 mg, Oral, Daily   Iron Combinations (I.L.X. B-12) ELIX 1 each, Oral, Daily PRN, 1 droplet full   levocetirizine (XYZAL) 5 MG tablet TAKE 1 TABLET BY MOUTH EVERY EVENING.   lidocaine (LIDODERM) 5 % Apply to the affected area once daily as needed for a week. **Remove patch after 12 hours**   sucralfate (CARAFATE) 1 g tablet Take 1 tablet by mouth every 6 hours as needed.   valACYclovir (VALTREX) 1,000 mg, Oral, 3 times daily PRN     VITALS:  There were no vitals filed for this visit.    12/11/2021   10:11 AM 02/02/2021   11:39 AM 08/31/2019   11:09 AM 12/12/2017    9:50 AM 06/18/2017    2:11 PM  Depression screen PHQ 2/9  Decreased Interest 0 0 0 1 0  Down, Depressed, Hopeless 0 1 0 1 0  PHQ - 2 Score 0 1 0 2 0  Altered sleeping    1   Tired, decreased energy    1   Change in appetite    2   Feeling bad or failure about yourself     1   Trouble concentrating    1   Moving slowly or fidgety/restless    0   Suicidal thoughts    0   PHQ-9 Score    8   Difficult doing work/chores    Somewhat difficult     PHYSICAL EXAM   HEENT:  Normocephalic, atraumatic. The mucous membranes are moist. The superficial temporal arteries are without ropiness or tenderness. Cardiovascular: Regular rate and rhythm. Lungs: Clear to auscultation bilaterally. Neck: There are no carotid bruits noted bilaterally.  NEUROLOGICAL:     No data to display             06/11/2019   10:34 AM  MMSE - Mini Mental State Exam  Orientation to time 5  Orientation to Place 5  Registration 3  Attention/ Calculation 5  Recall 3  Language- name 2 objects 2  Language- repeat 1  Language- follow 3 step command 3  Language- read & follow direction 1  Write a sentence 1  Copy design 1  Total score 30     Orientation:  Alert and oriented to person, place and time. No aphasia or dysarthria. Fund of knowledge is appropriate.  Recent memory impaired and remote memory intact.  Attention and concentration are normal.  Able to name objects and repeat phrases. Delayed recall  Cranial nerves: There is good facial symmetry. Extraocular muscles are intact and visual fields are full to confrontational testing. Speech is fluent and clear. Soft palate rises symmetrically and there is no tongue deviation. Hearing is intact to conversational tone. Tone: Tone is good throughout. Sensation: Sensation is intact to light touch and pinprick throughout. Vibration is intact at the bilateral big toe.There is no extinction with double simultaneous stimulation. There is no sensory dermatomal level identified. Coordination: The patient has no difficulty with RAM's or FNF bilaterally. Normal finger to nose  Motor: Strength is 5/5 in the bilateral upper and lower extremities. There is no pronator drift. There are no fasciculations noted. DTR's: Deep tendon reflexes are 2/4 at the bilateral biceps, triceps, brachioradialis, patella and achilles.  Plantar responses are downgoing bilaterally. Gait and Station: The patient is able to ambulate without difficulty.The patient is able to heel toe walk without any difficulty.The patient is able to ambulate in a tandem fashion. The patient is able to stand in the Romberg position.     Thank you for allowing [place] the opportunity to participate in the care of this nice patient. Please do not hesitate to contact us for any questions or concerns.   Total time spent on today's visit was *** minutes dedicated to this patient today, preparing to see patient, examining the patient, ordering tests and/or medications and counseling the patient, documenting clinical information in the EHR or other health record, independently interpreting results and communicating results to the patient/family, discussing treatment and goals, answering patient's questions and coordinating care.  Cc:  Algis Liming  [facility] 02/05/2022 6:53 AM

## 2022-02-05 NOTE — Progress Notes (Signed)
Pls inform patient B12 is normal, thanks

## 2022-02-06 ENCOUNTER — Ambulatory Visit (INDEPENDENT_AMBULATORY_CARE_PROVIDER_SITE_OTHER): Payer: No Typology Code available for payment source | Admitting: Family Medicine

## 2022-02-06 ENCOUNTER — Encounter: Payer: Self-pay | Admitting: Family Medicine

## 2022-02-06 ENCOUNTER — Other Ambulatory Visit (HOSPITAL_BASED_OUTPATIENT_CLINIC_OR_DEPARTMENT_OTHER): Payer: Self-pay

## 2022-02-06 VITALS — BP 112/72 | HR 91 | Temp 98.6°F | Resp 18 | Ht 65.0 in | Wt 225.0 lb

## 2022-02-06 DIAGNOSIS — R7989 Other specified abnormal findings of blood chemistry: Secondary | ICD-10-CM | POA: Diagnosis not present

## 2022-02-06 DIAGNOSIS — E0590 Thyrotoxicosis, unspecified without thyrotoxic crisis or storm: Secondary | ICD-10-CM

## 2022-02-06 MED ORDER — METHIMAZOLE 5 MG PO TABS
5.0000 mg | ORAL_TABLET | Freq: Every day | ORAL | 0 refills | Status: DC
  Filled 2022-02-06: qty 90, 90d supply, fill #0

## 2022-02-06 NOTE — Progress Notes (Signed)
Subjective:   By signing my name below, I, [name], attest that this documentation has been prepared under the direction and in the presence of [name], DO  02/06/2022    Patient ID: Emily Osborne, female    DOB: 1960/12/31, 61 y.o.   MRN: [id number]  Chief Complaint  Patient presents with   Follow-up   Hyperthyroidism    HPI Patient is in today for a follow up visit.   She continues experiencing fatigue, fast heart rate. She could not find an appointment with an endocrinologist within the next year. She was given 25 mg atenolol daily PO to help manage her symptoms but she found no change. She was on methimazole in the past but her provider taken her off it due to being on it for too long. She continues having difficulty sleeping due to increased heart rate at night. She reports her last B12 levels were elevated. Since then she has stopped taking her B12 supplements.     Past Medical History:  Diagnosis Date   Allergy    Anxiety    Asthma    pt has inhaler   Back pain    C. difficile colitis    Cataract    bil eyes   Chest pain    Chronic fatigue syndrome    Clostridium difficile colitis 12/13/2016   Diverticulitis    Dyspnea    Dysrhythmia    palpitations   Edema    Elevated blood pressure reading without diagnosis of hypertension    "just elevated when I'm in pain" (12/07/2015)   Fatty liver    Fibromyalgia    Ganglion of joint    right wrist   GERD (gastroesophageal reflux disease)    H. pylori infection    Headache    hx of   Helicobacter pylori (H. pylori) infection 12/13/2016   Herpes zoster without mention of complication    HTN (hypertension)    Hyperlipidemia    Hyperthyroidism    IBS (irritable bowel syndrome)    Leg edema    Multiple drug allergies 06/18/2017   Myalgia    Nontraumatic rupture of Achilles tendon    Other acute reactions to stress    Pain in joint, shoulder region    Pain in joint, upper arm    Pain in limb     Palpitations    Personal history of other diseases of digestive system    Pneumonia    once   PONV (postoperative nausea and vomiting)    Routine screening for STI (sexually transmitted infection) 12/13/2016   S/P partial colectomy 06/18/2017   Thyroid disease    hyperthyroidism   Type 2 diabetes mellitus with hyperglycemia, without long-term current use of insulin ([place]) 08/07/2018   Vitamin B 12 deficiency     Past Surgical History:  Procedure Laterality Date   ABDOMINAL HYSTERECTOMY     ACHILLES TENDON REPAIR Right 2005   BUNIONECTOMY Bilateral    Bunionectomy 1983   COLON SURGERY  01/23/2017   6 to 8 inches sigmoid colon removed   COLONOSCOPY     GANGLION CYST EXCISION Right    wrist   LEFT HEART CATH AND CORONARY ANGIOGRAPHY N/A 10/03/2016   Procedure: Left Heart Cath and Coronary Angiography;  Surgeon: Burnell Blanks, MD;  Location: [facility];  Service: Cardiovascular;  Laterality: N/A;   MENISCUS REPAIR Right 04/2014   POLYPECTOMY     TUBAL LIGATION     wisdomteeth  extraction      Family History  Problem Relation Age of Onset   Hypertension Father    Diabetes Father    Heart disease Father    Kidney disease Father        Died, 45   Hyperlipidemia Father    Leukemia Brother    Cancer Brother        myleoblastic anemia   Hyperlipidemia Mother    Hypertension Mother        Died, 71   Thyroid disease Mother        Thyroid surgery   Obesity Mother    Heart disease Mother    Pernicious anemia Sister    Thyroid disease Sister        On thyroid Rx   Lupus Daughter    Coronary artery disease Brother    Hypertension Brother    Colon cancer Neg Hx    Esophageal cancer Neg Hx    Rectal cancer Neg Hx    Stomach cancer Neg Hx    Pancreatic cancer Neg Hx    Prostate cancer Neg Hx    Colon polyps Neg Hx     Social History   Socioeconomic History   Marital status: Divorced    Spouse name: Not on file   Number of children: 2   Years of  education: Not on file   Highest education level: Not on file  Occupational History   Occupation: Personnel officer  Tobacco Use   Smoking status: Never   Smokeless tobacco: Never  Vaping Use   Vaping Use: Never used  Substance and Sexual Activity   Alcohol use: No    Alcohol/week: 0.0 standard drinks of alcohol   Drug use: No   Sexual activity: Not Currently    Partners: Male  Other Topics Concern   Not on file  Social History Narrative   Lives with alone.  She has two daughter.   She works as a Programme researcher, broadcasting/film/video at [employer]--- no   Right Handed   Social Determinants of Radio broadcast assistant Strain: Not on file  Food Insecurity: Not on file  Transportation Needs: Not on file  Physical Activity: Not on file  Stress: Not on file  Social Connections: Not on file  Intimate Partner Violence: Not on file    Outpatient Medications Prior to Visit  Medication Sig Dispense Refill   acetaminophen (TYLENOL) 500 MG tablet Take 1,000 mg by mouth every 6 (six) hours as needed for moderate pain.     albuterol (PROAIR HFA) 108 (90 Base) MCG/ACT inhaler USE 2 PUFFS EVERY 4 HOURS AS NEEDED FOR COUGH, WHEEZE OR SHORTNESS OF BREATH. 8.5 each 2   ALPRAZolam (XANAX) 0.25 MG tablet TAKE 1 TABLET BY MOUTH THREE TIMES A DAY AS NEEDED 30 tablet 2   AMBULATORY NON FORMULARY MEDICATION Medication Name: [facility]ist  Apply to affected area as needed 30 g 11   atenolol (TENORMIN) 25 MG tablet Take 1/2 tablet (12.5 mg total) by mouth daily. 15 tablet 0   azelastine (OPTIVAR) 0.05 % ophthalmic solution Place 1 drop into both eyes 2 (two) times daily as needed.     Blood Glucose Monitoring Suppl (FREESTYLE FREEDOM) KIT 1 Device by Does not apply route daily. 1 each 0   budesonide-formoterol (SYMBICORT) 80-4.5 MCG/ACT inhaler INHALE 2 PUFFS BY MOUTH FIRST THING IN THE MORNING AND THEN ANOTHER 2 PUFFS ABOUT 12 HOURS LATER 10.2 g 4  dexlansoprazole (DEXILANT) 60 MG capsule Take 1 capsule (60 mg total) by mouth daily. 90 capsule 2   diclofenac sodium (VOLTAREN) 1 % GEL Apply 2 g topically 4 (four) times daily. Rub into affected area of foot 2 to 4 times daily 100 g 2   famotidine (PEPCID) 20 MG tablet One after supper 30 tablet 11   fluticasone (FLONASE) 50 MCG/ACT nasal spray USE 2 SPRAYS INTO BOTH NOSTRILS DAILY. 16 g 5   glucose blood (FREESTYLE LITE) test strip USE TO CHECK BLOOD SUGAR ONCE A DAY 100 each 1   hydrochlorothiazide (HYDRODIURIL) 25 MG tablet Take 1 tablet (25 mg total) by mouth daily. 90 tablet 3   Iron Combinations (I.L.X. B-12) ELIX Take 1 each by mouth daily as needed (energy). 1 droplet full     levocetirizine (XYZAL) 5 MG tablet TAKE 1 TABLET BY MOUTH EVERY EVENING. 90 tablet 3   lidocaine (LIDODERM) 5 % Apply to the affected area once daily as needed for a week. **Remove patch after 12 hours** 30 patch 0   Probiotic Product (ALIGN) 4 MG CAPS Take 1 capsule (4 mg total) by mouth daily. 90 capsule 3   sucralfate (CARAFATE) 1 g tablet Take 1 tablet by mouth every 6 hours as needed. 60 tablet 3   valACYclovir (VALTREX) 1000 MG tablet Take 1 tablet (1,000 mg total) by mouth 3 (three) times daily as needed. 30 tablet 2   No facility-administered medications prior to visit.    Allergies  Allergen Reactions   Aspirin Other (See Comments)    REACTION: anaphylaxis   Gentamicin Other (See Comments)    Eye drops turned the sclera bright red   Ibuprofen Other (See Comments)    REACTION: anaphylaxsis   Metronidazole Swelling    REACTION: red face/swelling   Nsaids Other (See Comments)    REACTION: anaphylaxis   Doxycycline Other (See Comments)    REACTION: severe nausea/vomiting   Influenza A (H1n1) [place] Vac Other (See Comments)    REACTION: sick for 3 weeks   Loratadine Other (See Comments)    Fatigue/weakness   Metformin And Related Other (See Comments)    Memory issues and headache   Periactin  [Cyproheptadine] Other (See Comments)    paranoid    Ranitidine Hcl Other (See Comments)    REACTION: Lips turned red /peel   Sulfamethoxazole-Trimethoprim Other (See Comments)    REACTION: face red/peel   Tramadol Hcl Other (See Comments)    REACTION: paranoid    Review of Systems  Constitutional:  Positive for malaise/fatigue. Negative for fever.  HENT:  Negative for congestion.   Eyes:  Negative for blurred vision.  Respiratory:  Negative for shortness of breath.   Cardiovascular:  Negative for chest pain, palpitations and leg swelling.       (+)increased heart rate  Gastrointestinal:  Negative for abdominal pain, blood in stool and nausea.  Genitourinary:  Negative for dysuria and frequency.  Musculoskeletal:  Negative for falls.  Skin:  Negative for rash.  Neurological:  Negative for dizziness, loss of consciousness and headaches.  Endo/Heme/Allergies:  Negative for environmental allergies.  Psychiatric/Behavioral:  Negative for depression. The patient is not nervous/anxious.        Objective:    Physical Exam Vitals and nursing note reviewed.  Constitutional:      General: She is not in acute distress.    Appearance: Normal appearance. She is well-developed. She is not ill-appearing.  HENT:     Head: Normocephalic and atraumatic.  Right Ear: External ear normal.     Left Ear: External ear normal.  Eyes:     Extraocular Movements: Extraocular movements intact.     Conjunctiva/sclera: Conjunctivae normal.     Pupils: Pupils are equal, round, and reactive to light.  Neck:     Thyroid: No thyromegaly.     Vascular: No carotid bruit or JVD.  Cardiovascular:     Rate and Rhythm: Normal rate and regular rhythm.     Heart sounds: Normal heart sounds. No murmur heard.    No gallop.  Pulmonary:     Effort: Pulmonary effort is normal. No respiratory distress.     Breath sounds: Normal breath sounds. No wheezing or rales.  Chest:     Chest wall: No tenderness.   Musculoskeletal:     Cervical back: Normal range of motion and neck supple.  Skin:    General: Skin is warm and dry.  Neurological:     Mental Status: She is alert and oriented to person, place, and time.  Psychiatric:        Judgment: Judgment normal.     BP 112/72 (BP Location: Right Arm, Patient Position: Sitting, Cuff Size: Large)   Pulse 91   Temp 98.6 F (37 C) (Oral)   Resp 18   Ht 5' 5"  (1.651 m)   Wt 225 lb (102.1 kg)   SpO2 97%   BMI 37.44 kg/m  Wt Readings from Last 3 Encounters:  02/06/22 225 lb (102.1 kg)  02/05/22 224 lb (101.6 kg)  01/23/22 235 lb (106.6 kg)    Diabetic Foot Exam - Simple   No data filed    Lab Results  Component Value Date   WBC 4.8 12/11/2021   HGB 14.1 12/11/2021   HCT 43.2 12/11/2021   PLT 271.0 12/11/2021   GLUCOSE 99 01/23/2022   CHOL 199 12/11/2021   TRIG 180.0 (H) 12/11/2021   HDL 49.40 12/11/2021   LDLDIRECT 139.4 12/26/2011   LDLCALC 113 (H) 12/11/2021   ALT 38 (H) 01/23/2022   AST 31 01/23/2022   NA 139 01/23/2022   K 3.7 01/23/2022   CL 100 01/23/2022   CREATININE 0.73 01/23/2022   BUN 16 01/23/2022   CO2 30 01/23/2022   TSH <0.01 (L) 01/23/2022   INR 1.0 06/03/2020   HGBA1C 6.1 12/11/2021   MICROALBUR <0.7 12/11/2021    Lab Results  Component Value Date   TSH <0.01 (L) 01/23/2022   Lab Results  Component Value Date   WBC 4.8 12/11/2021   HGB 14.1 12/11/2021   HCT 43.2 12/11/2021   MCV 89.6 12/11/2021   PLT 271.0 12/11/2021   Lab Results  Component Value Date   NA 139 01/23/2022   K 3.7 01/23/2022   CO2 30 01/23/2022   GLUCOSE 99 01/23/2022   BUN 16 01/23/2022   CREATININE 0.73 01/23/2022   BILITOT 0.5 01/23/2022   ALKPHOS 57 01/23/2022   AST 31 01/23/2022   ALT 38 (H) 01/23/2022   PROT 6.8 01/23/2022   ALBUMIN 3.8 01/23/2022   CALCIUM 9.6 01/23/2022   ANIONGAP 10 06/03/2020   GFR 88.39 01/23/2022   Lab Results  Component Value Date   CHOL 199 12/11/2021   Lab Results  Component  Value Date   HDL 49.40 12/11/2021   Lab Results  Component Value Date   LDLCALC 113 (H) 12/11/2021   Lab Results  Component Value Date   TRIG 180.0 (H) 12/11/2021   Lab Results  Component Value Date  CHOLHDL 4 12/11/2021   Lab Results  Component Value Date   HGBA1C 6.1 12/11/2021       Assessment & Plan:   Problem List Items Addressed This Visit   None Visit Diagnoses     Hyperthyroidism    -  Primary   Relevant Medications   methimazole (TAPAZOLE) 5 MG tablet   Other Relevant Orders   Thyroid Panel With TSH   High serum vitamin B12       Relevant Orders   CBC with Differential/Platelet   Vitamin B12        Meds ordered this encounter  Medications   methimazole (TAPAZOLE) 5 MG tablet    Sig: Take 1 tablet (5 mg total) by mouth daily.    Dispense:  90 tablet    Refill:  0    I, [name], DO, personally preformed the services described in this documentation.  All medical record entries made by the scribe were at my direction and in my presence.  I have reviewed the chart and discharge instructions (if applicable) and agree that the record reflects my personal performance and is accurate and complete. 02/06/2022   I,[name],acting as a Education administrator for [employer], DO.,have documented all relevant documentation on the behalf of [name], DO,as directed by  [name], DO while in the presence of [name], DO.   [name], DO

## 2022-02-06 NOTE — Patient Instructions (Signed)
Hyperthyroidism  Hyperthyroidism is when the thyroid gland is too active (overactive). The thyroid gland is a small gland located in the lower front part of the neck, just in front of the windpipe (trachea). This gland makes hormones that help control how the body uses food for energy (metabolism) as well as how the heart and brain function. These hormones also play a role in keeping your bones strong. When the thyroid is overactive, it produces too much of a hormone called thyroxine. What are the causes? This condition may be caused by: Graves' disease. This is a disorder in which the body's disease-fighting system (immune system) attacks the thyroid gland. This is the most common cause. Inflammation of the thyroid gland. A tumor in the thyroid gland. Use of certain medicines, including: Prescription thyroid hormone replacement. Herbal supplements that mimic thyroid hormones. Amiodarone therapy. Solid or fluid-filled lumps within your thyroid gland (thyroid nodules). Taking in a large amount of iodine from foods or medicines. What increases the risk? You are more likely to develop this condition if: You are female. You have a family history of thyroid conditions. You smoke tobacco. You use a medicine called lithium. You take medicines that affect the immune system (immunosuppressants). What are the signs or symptoms? Symptoms of this condition include: Nervousness. Inability to tolerate heat. Unexplained weight loss. Diarrhea. Change in the texture of hair or skin. Heart skipping beats or making extra beats. Rapid heart rate. Loss of menstruation. Shaky hands. Fatigue. Restlessness. Sleep problems. Enlarged thyroid gland or a lump in the thyroid (nodule). You may also have symptoms of Graves' disease, which may include: Protruding eyes. Dry eyes. Red or swollen eyes. Problems with vision. How is this diagnosed? This condition may be diagnosed based on: Your symptoms and  medical history. A physical exam. Blood tests. Thyroid ultrasound. This test involves using sound waves to produce images of the thyroid gland. A thyroid scan. A radioactive substance is injected into a vein, and images show how much iodine is present in the thyroid. Radioactive iodine uptake test (RAIU). A small amount of radioactive iodine is given by mouth to see how much iodine the thyroid absorbs after a certain amount of time. How is this treated? Treatment depends on the cause and severity of the condition. Treatment may include: Medicines to reduce the amount of thyroid hormone your body makes. Radioactive iodine treatment (radioiodine therapy). This involves swallowing a small dose of radioactive iodine, in capsule or liquid form, to kill thyroid cells. Surgery to remove part or all of your thyroid gland. You may need to take thyroid hormone replacement medicine for the rest of your life after thyroid surgery. Medicines to help manage your symptoms. Follow these instructions at home:  Take over-the-counter and prescription medicines only as told by your health care provider. Do not use any products that contain nicotine or tobacco, such as cigarettes and e-cigarettes. If you need help quitting, ask your health care provider. Follow any instructions from your health care provider about diet. You may be instructed to limit foods that contain iodine. Keep all follow-up visits as told by your health care provider. This is important. You will need to have blood tests regularly so that your health care provider can monitor your condition. Contact a health care provider if: Your symptoms do not get better with treatment. You have a fever. You are taking thyroid hormone replacement medicine and you: Have symptoms of depression. Feel like you are tired all the time. Gain weight. Get help   right away if: You have chest pain. You have decreased alertness or a change in your awareness. You  have abdominal pain. You feel dizzy. You have a rapid heartbeat. You have an irregular heartbeat. You have difficulty breathing. Summary The thyroid gland is a small gland located in the lower front part of the neck, just in front of the windpipe (trachea). Hyperthyroidism is when the thyroid gland is too active (overactive) and produces too much of a hormone called thyroxine. The most common cause is Graves' disease, a disorder in which your immune system attacks the thyroid gland. Hyperthyroidism can cause various symptoms, such as unexplained weight loss, nervousness, inability to tolerate heat, or changes in your heartbeat. Treatment may include medicine to reduce the amount of thyroid hormone your body makes, radioiodine therapy, surgery, or medicines to manage symptoms. This information is not intended to replace advice given to you by your health care provider. Make sure you discuss any questions you have with your health care provider. Document Revised: 08/18/2021 Document Reviewed: 04/21/2020 Elsevier Patient Education  2023 [employer] Inc[employer]  

## 2022-02-08 ENCOUNTER — Other Ambulatory Visit (HOSPITAL_COMMUNITY): Payer: Self-pay

## 2022-02-08 ENCOUNTER — Telehealth: Payer: Self-pay | Admitting: Pharmacy Technician

## 2022-02-08 NOTE — Telephone Encounter (Signed)
Patient Advocate Encounter  Received notification from [facility] that prior authorization for DEXLANSOPRAZOLE '60MG'$  is required.   PA submitted on 6.22.23 Key [id number] PROMPTPA Prior Auth (EOC) ID:  [id number] Status is pending    Luciano Cutter, CPhT Patient Advocate Phone: [phone]

## 2022-02-23 ENCOUNTER — Other Ambulatory Visit (HOSPITAL_COMMUNITY): Payer: Self-pay

## 2022-02-26 ENCOUNTER — Other Ambulatory Visit (HOSPITAL_COMMUNITY): Payer: Self-pay

## 2022-03-03 ENCOUNTER — Other Ambulatory Visit (HOSPITAL_COMMUNITY): Payer: Self-pay

## 2022-03-07 ENCOUNTER — Encounter: Payer: Self-pay | Admitting: Family Medicine

## 2022-03-07 ENCOUNTER — Other Ambulatory Visit (HOSPITAL_COMMUNITY): Payer: Self-pay

## 2022-03-07 DIAGNOSIS — E0590 Thyrotoxicosis, unspecified without thyrotoxic crisis or storm: Secondary | ICD-10-CM

## 2022-03-07 MED ORDER — ATENOLOL 25 MG PO TABS
12.5000 mg | ORAL_TABLET | Freq: Every day | ORAL | 0 refills | Status: DC
  Filled 2022-03-07: qty 45, 90d supply, fill #0

## 2022-03-08 ENCOUNTER — Ambulatory Visit: Payer: No Typology Code available for payment source | Admitting: Physician Assistant

## 2022-03-09 ENCOUNTER — Encounter: Payer: Self-pay | Admitting: Family Medicine

## 2022-03-09 ENCOUNTER — Telehealth: Payer: Self-pay | Admitting: *Deleted

## 2022-03-09 ENCOUNTER — Other Ambulatory Visit (INDEPENDENT_AMBULATORY_CARE_PROVIDER_SITE_OTHER): Payer: No Typology Code available for payment source

## 2022-03-09 DIAGNOSIS — R7989 Other specified abnormal findings of blood chemistry: Secondary | ICD-10-CM

## 2022-03-09 DIAGNOSIS — E0590 Thyrotoxicosis, unspecified without thyrotoxic crisis or storm: Secondary | ICD-10-CM

## 2022-03-09 LAB — CBC WITH DIFFERENTIAL/PLATELET
Basophils Absolute: 0 10*3/uL (ref 0.0–0.1)
Basophils Relative: 0.3 % (ref 0.0–3.0)
Eosinophils Absolute: 0.1 10*3/uL (ref 0.0–0.7)
Eosinophils Relative: 2.6 % (ref 0.0–5.0)
HCT: 38.2 % (ref 36.0–46.0)
Hemoglobin: 12.6 g/dL (ref 12.0–15.0)
Lymphocytes Relative: 46.3 % — ABNORMAL HIGH (ref 12.0–46.0)
Lymphs Abs: 1.5 10*3/uL (ref 0.7–4.0)
MCHC: 32.9 g/dL (ref 30.0–36.0)
MCV: 85.1 fl (ref 78.0–100.0)
Monocytes Absolute: 0.5 10*3/uL (ref 0.1–1.0)
Monocytes Relative: 15.4 % — ABNORMAL HIGH (ref 3.0–12.0)
Neutro Abs: 1.1 10*3/uL — ABNORMAL LOW (ref 1.4–7.7)
Neutrophils Relative %: 35.4 % — ABNORMAL LOW (ref 43.0–77.0)
Platelets: 175 10*3/uL (ref 150.0–400.0)
RBC: 4.5 Mil/uL (ref 3.87–5.11)
RDW: 14.5 % (ref 11.5–15.5)
WBC: 3.2 10*3/uL — ABNORMAL LOW (ref 4.0–10.5)

## 2022-03-09 LAB — VITAMIN B12: Vitamin B-12: 580 pg/mL (ref 211–911)

## 2022-03-10 LAB — THYROID PANEL WITH TSH
Free Thyroxine Index: 6.5 — ABNORMAL HIGH (ref 1.4–3.8)
T3 Uptake: 37 % — ABNORMAL HIGH (ref 22–35)
T4, Total: 17.5 ug/dL — ABNORMAL HIGH (ref 5.1–11.9)
TSH: <0.01 mIU/L — ABNORMAL LOW (ref 0.40–4.50)

## 2022-03-13 ENCOUNTER — Encounter: Payer: Self-pay | Admitting: Family Medicine

## 2022-03-13 ENCOUNTER — Ambulatory Visit: Payer: No Typology Code available for payment source | Admitting: Physician Assistant

## 2022-03-13 ENCOUNTER — Ambulatory Visit (INDEPENDENT_AMBULATORY_CARE_PROVIDER_SITE_OTHER): Payer: No Typology Code available for payment source | Admitting: Family Medicine

## 2022-03-13 VITALS — BP 120/82 | HR 84 | Temp 98.5°F | Resp 12 | Ht 65.0 in | Wt 230.8 lb

## 2022-03-13 DIAGNOSIS — Z029 Encounter for administrative examinations, unspecified: Secondary | ICD-10-CM

## 2022-03-13 DIAGNOSIS — E0500 Thyrotoxicosis with diffuse goiter without thyrotoxic crisis or storm: Secondary | ICD-10-CM

## 2022-03-13 DIAGNOSIS — M7989 Other specified soft tissue disorders: Secondary | ICD-10-CM | POA: Diagnosis not present

## 2022-03-13 DIAGNOSIS — M79621 Pain in right upper arm: Secondary | ICD-10-CM | POA: Diagnosis not present

## 2022-03-13 MED ORDER — FUROSEMIDE 40 MG PO TABS
40.0000 mg | ORAL_TABLET | Freq: Every day | ORAL | 3 refills | Status: DC
  Filled 2022-03-13 – 2022-03-14 (×2): qty 30, 30d supply, fill #0
  Filled 2022-05-16: qty 30, 30d supply, fill #1

## 2022-03-13 MED ORDER — METHIMAZOLE 5 MG PO TABS
5.0000 mg | ORAL_TABLET | Freq: Three times a day (TID) | ORAL | 1 refills | Status: DC
  Filled 2022-03-13 – 2022-03-14 (×2): qty 90, 30d supply, fill #0
  Filled 2022-05-16: qty 90, 30d supply, fill #1

## 2022-03-13 NOTE — Progress Notes (Addendum)
Subjective:   By signing my name below, I, Emily Osborne, attest that this documentation has been prepared under the direction and in the presence of Emily Held, DO  03/13/2022   Patient ID: Emily Osborne, female    DOB: [birth year], 61 y.o.   MRN: [id number]  Chief Complaint  Patient presents with   somthing inside undernealth right armpit   Leg Swelling    Methimazole?    HPI Patient is in today for follow up visit.   She has been having complications with her thyroid levels, but her B12 levels have improved. She has developed pain and edema in her lower extremities, but her pain has improved as of this time. The swelling in her legs goes down when she props them up at night, but they start to swell again when she moves around the next morning. Her fatigue has resolved at this time. She has been feeling cold/experiencing chills and shakiness. But describes that her "shakiness is internal." She also states that she has something "growing" under her right arm, but is unsure if it is her tendon. She adds that the area under her right arm is tender. She continues to take atenolol as well as her other medications. She is scheduled to have a mammogram in two weeks from now.   Past Medical History:  Diagnosis Date   Allergy    Anxiety    Asthma    pt has inhaler   Back pain    C. difficile colitis    Cataract    bil eyes   Chest pain    Chronic fatigue syndrome    Clostridium difficile colitis 12/13/2016   Diverticulitis    Dyspnea    Dysrhythmia    palpitations   Edema    Elevated blood pressure reading without diagnosis of hypertension    "just elevated when I'm in pain" (12/07/2015)   Fatty liver    Fibromyalgia    Ganglion of joint    right wrist   GERD (gastroesophageal reflux disease)    H. pylori infection    Headache    hx of   Helicobacter pylori (H. pylori) infection 12/13/2016   Herpes zoster without mention of complication    HTN (hypertension)     Hyperlipidemia    Hyperthyroidism    IBS (irritable bowel syndrome)    Leg edema    Multiple drug allergies 06/18/2017   Myalgia    Nontraumatic rupture of Achilles tendon    Other acute reactions to stress    Pain in joint, shoulder region    Pain in joint, upper arm    Pain in limb    Palpitations    Personal history of other diseases of digestive system    Pneumonia    once   PONV (postoperative nausea and vomiting)    Routine screening for STI (sexually transmitted infection) 12/13/2016   S/P partial colectomy 06/18/2017   Thyroid disease    hyperthyroidism   Type 2 diabetes mellitus with hyperglycemia, without long-term current use of insulin ([place]) 08/07/2018   Vitamin B 12 deficiency     Past Surgical History:  Procedure Laterality Date   ABDOMINAL HYSTERECTOMY     ACHILLES TENDON REPAIR Right 2005   BUNIONECTOMY Bilateral    Bunionectomy 1983   COLON SURGERY  01/23/2017   6 to 8 inches sigmoid colon removed   COLONOSCOPY     GANGLION CYST EXCISION Right    wrist   LEFT HEART  CATH AND CORONARY ANGIOGRAPHY N/A 10/03/2016   Procedure: Left Heart Cath and Coronary Angiography;  Surgeon: Burnell Blanks, MD;  Location: [facility];  Service: Cardiovascular;  Laterality: N/A;   MENISCUS REPAIR Right 04/2014   POLYPECTOMY     TUBAL LIGATION     wisdomteeth extraction      Family History  Problem Relation Age of Onset   Hypertension Father    Diabetes Father    Heart disease Father    Kidney disease Father        Died, 77   Hyperlipidemia Father    Leukemia Brother    Cancer Brother        myleoblastic anemia   Hyperlipidemia Mother    Hypertension Mother        Died, 1   Thyroid disease Mother        Thyroid surgery   Obesity Mother    Heart disease Mother    Pernicious anemia Sister    Thyroid disease Sister        On thyroid Rx   Lupus Daughter    Coronary artery disease Brother    Hypertension Brother    Colon cancer Neg Hx     Esophageal cancer Neg Hx    Rectal cancer Neg Hx    Stomach cancer Neg Hx    Pancreatic cancer Neg Hx    Prostate cancer Neg Hx    Colon polyps Neg Hx     Social History   Socioeconomic History   Marital status: Divorced    Spouse name: Not on file   Number of children: 2   Years of education: Not on file   Highest education level: Not on file  Occupational History   Occupation: Personnel officer  Tobacco Use   Smoking status: Never   Smokeless tobacco: Never  Vaping Use   Vaping Use: Never used  Substance and Sexual Activity   Alcohol use: No    Alcohol/week: 0.0 standard drinks of alcohol   Drug use: No   Sexual activity: Not Currently    Partners: Male  Other Topics Concern   Not on file  Social History Narrative   Lives with alone.  She has two daughter.   She works as a Programme researcher, broadcasting/film/video at [employer]--- no   Right Handed   Social Determinants of Radio broadcast assistant Strain: Not on file  Food Insecurity: Not on file  Transportation Needs: Not on file  Physical Activity: Not on file  Stress: Not on file  Social Connections: Not on file  Intimate Partner Violence: Not on file    Outpatient Medications Prior to Visit  Medication Sig Dispense Refill   acetaminophen (TYLENOL) 500 MG tablet Take 1,000 mg by mouth every 6 (six) hours as needed for moderate pain.     albuterol (PROAIR HFA) 108 (90 Base) MCG/ACT inhaler USE 2 PUFFS EVERY 4 HOURS AS NEEDED FOR COUGH, WHEEZE OR SHORTNESS OF BREATH. 8.5 each 2   ALPRAZolam (XANAX) 0.25 MG tablet TAKE 1 TABLET BY MOUTH THREE TIMES A DAY AS NEEDED 30 tablet 2   AMBULATORY NON FORMULARY MEDICATION Medication Name: [facility]  Apply to affected area as needed 30 g 11   atenolol (TENORMIN) 25 MG tablet Take 1/2 tablet (12.5 mg total) by mouth daily. 45 tablet 0   azelastine (OPTIVAR) 0.05 % ophthalmic solution Place 1 drop into both eyes 2 (two) times  daily as  needed.     Blood Glucose Monitoring Suppl (FREESTYLE FREEDOM) KIT 1 Device by Does not apply route daily. 1 each 0   budesonide-formoterol (SYMBICORT) 80-4.5 MCG/ACT inhaler INHALE 2 PUFFS BY MOUTH FIRST THING IN THE MORNING AND THEN ANOTHER 2 PUFFS ABOUT 12 HOURS LATER 10.2 g 4   dexlansoprazole (DEXILANT) 60 MG capsule Take 1 capsule (60 mg total) by mouth daily. 90 capsule 2   diclofenac sodium (VOLTAREN) 1 % GEL Apply 2 g topically 4 (four) times daily. Rub into affected area of foot 2 to 4 times daily 100 g 2   famotidine (PEPCID) 20 MG tablet One after supper 30 tablet 11   fluticasone (FLONASE) 50 MCG/ACT nasal spray USE 2 SPRAYS INTO BOTH NOSTRILS DAILY. 16 g 5   glucose blood (FREESTYLE LITE) test strip USE TO CHECK BLOOD SUGAR ONCE A DAY 100 each 1   Iron Combinations (I.L.X. B-12) ELIX Take 1 each by mouth daily as needed (energy). 1 droplet full     levocetirizine (XYZAL) 5 MG tablet TAKE 1 TABLET BY MOUTH EVERY EVENING. 90 tablet 3   lidocaine (LIDODERM) 5 % Apply to the affected area once daily as needed for a week. **Remove patch after 12 hours** 30 patch 0   Probiotic Product (ALIGN) 4 MG CAPS Take 1 capsule (4 mg total) by mouth daily. 90 capsule 3   sucralfate (CARAFATE) 1 g tablet Take 1 tablet by mouth every 6 hours as needed. 60 tablet 3   valACYclovir (VALTREX) 1000 MG tablet Take 1 tablet (1,000 mg total) by mouth 3 (three) times daily as needed. 30 tablet 2   hydrochlorothiazide (HYDRODIURIL) 25 MG tablet Take 1 tablet (25 mg total) by mouth daily. 90 tablet 3   methimazole (TAPAZOLE) 5 MG tablet Take 1 tablet (5 mg total) by mouth daily. 90 tablet 0   No facility-administered medications prior to visit.    Allergies  Allergen Reactions   Aspirin Other (See Comments)    REACTION: anaphylaxis   Gentamicin Other (See Comments)    Eye drops turned the sclera bright red   Ibuprofen Other (See Comments)    REACTION: anaphylaxsis   Metronidazole Swelling     REACTION: red face/swelling   Nsaids Other (See Comments)    REACTION: anaphylaxis   Doxycycline Other (See Comments)    REACTION: severe nausea/vomiting   Influenza A (H1n1) [place] Other (See Comments)    REACTION: sick for 3 weeks   Loratadine Other (See Comments)    Fatigue/weakness   Metformin And Related Other (See Comments)    Memory issues and headache   Periactin [Cyproheptadine] Other (See Comments)    paranoid    Ranitidine Hcl Other (See Comments)    REACTION: Lips turned red /peel   Sulfamethoxazole-Trimethoprim Other (See Comments)    REACTION: face red/peel   Tramadol Hcl Other (See Comments)    REACTION: paranoid    Review of Systems  Constitutional:  Negative for fever and malaise/fatigue.  HENT:  Negative for congestion.   Eyes:  Negative for blurred vision.  Respiratory:  Negative for shortness of breath.   Cardiovascular:  Positive for leg swelling. Negative for chest pain and palpitations.  Gastrointestinal:  Negative for abdominal pain, blood in stool and nausea.  Genitourinary:  Negative for dysuria and frequency.  Musculoskeletal:  Negative for falls.       (+)lower leg pains  Skin:  Negative for rash.  Neurological:  Negative for dizziness, loss of consciousness and headaches.  Endo/Heme/Allergies:  Negative for environmental allergies.  Psychiatric/Behavioral:  Negative for depression. The patient is not nervous/anxious.        Objective:    Physical Exam Vitals and nursing note reviewed.  Constitutional:      Appearance: Normal appearance. She is not ill-appearing.  HENT:     Head: Normocephalic and atraumatic.     Right Ear: External ear normal.     Left Ear: External ear normal.  Eyes:     Extraocular Movements: Extraocular movements intact.     Pupils: Pupils are equal, round, and reactive to light.  Cardiovascular:     Rate and Rhythm: Normal rate and regular rhythm.     Heart sounds: Normal heart sounds. No murmur heard.    No  gallop.  Pulmonary:     Effort: Pulmonary effort is normal. No respiratory distress.     Breath sounds: Normal breath sounds. No wheezing or rales.  Chest:  Breasts:    Right: Normal.     Left: Normal.     Comments: Tenderness R axilla and tendon more visible in R axilla then in left--- tender to touch and tricep tender  No errythema No swelling  Musculoskeletal:        General: Swelling present.     Right lower leg: 1+ Pitting Edema present.     Left lower leg: 1+ Pitting Edema present.  Lymphadenopathy:     Upper Body:     Right upper body: No axillary adenopathy.     Left upper body: No axillary adenopathy.  Skin:    General: Skin is warm and dry.  Neurological:     Mental Status: She is alert and oriented to person, place, and time.  Psychiatric:        Judgment: Judgment normal.     BP 120/82 (BP Location: Left Arm, Cuff Size: Large)   Pulse 84   Temp 98.5 F (36.9 C) (Oral)   Resp 12   Ht _0  (1.651 m)   Wt 230 lb 12.8 oz (104.7 kg)   SpO2 100%   BMI 38.41 kg/m  Wt Readings from Last 3 Encounters:  03/13/22 230 lb 12.8 oz (104.7 kg)  02/06/22 225 lb (102.1 kg)  02/05/22 224 lb (101.6 kg)    Diabetic Foot Exam - Simple   No data filed    Lab Results  Component Value Date   WBC 3.2 (L) 03/09/2022   HGB 12.6 03/09/2022   HCT 38.2 03/09/2022   PLT 175.0 03/09/2022   GLUCOSE 99 01/23/2022   CHOL 199 12/11/2021   TRIG 180.0 (H) 12/11/2021   HDL 49.40 12/11/2021   LDLDIRECT 139.4 12/26/2011   LDLCALC 113 (H) 12/11/2021   ALT 38 (H) 01/23/2022   AST 31 01/23/2022   NA 139 01/23/2022   K 3.7 01/23/2022   CL 100 01/23/2022   CREATININE 0.73 01/23/2022   BUN 16 01/23/2022   CO2 30 01/23/2022   TSH <0.01 (L) 03/09/2022   INR 1.0 06/03/2020   HGBA1C 6.1 12/11/2021   MICROALBUR <0.7 12/11/2021    Lab Results  Component Value Date   TSH <0.01 (L) 03/09/2022   Lab Results  Component Value Date   WBC 3.2 (L) 03/09/2022   HGB 12.6 03/09/2022    HCT 38.2 03/09/2022   MCV 85.1 03/09/2022   PLT 175.0 03/09/2022   Lab Results  Component Value Date   NA 139 01/23/2022   K 3.7 01/23/2022   CO2 30 01/23/2022  GLUCOSE 99 01/23/2022   BUN 16 01/23/2022   CREATININE 0.73 01/23/2022   BILITOT 0.5 01/23/2022   ALKPHOS 57 01/23/2022   AST 31 01/23/2022   ALT 38 (H) 01/23/2022   PROT 6.8 01/23/2022   ALBUMIN 3.8 01/23/2022   CALCIUM 9.6 01/23/2022   ANIONGAP 10 06/03/2020   GFR 88.39 01/23/2022   Lab Results  Component Value Date   CHOL 199 12/11/2021   Lab Results  Component Value Date   HDL 49.40 12/11/2021   Lab Results  Component Value Date   LDLCALC 113 (H) 12/11/2021   Lab Results  Component Value Date   TRIG 180.0 (H) 12/11/2021   Lab Results  Component Value Date   CHOLHDL 4 12/11/2021   Lab Results  Component Value Date   HGBA1C 6.1 12/11/2021       Assessment & Plan:   Problem List Items Addressed This Visit       Unprioritized   Graves disease (Chronic)    Increase methimazole 5 mg bid amd we may increase to tid Will reach out to endo       Relevant Medications   methimazole (TAPAZOLE) 5 MG tablet   Other Visit Diagnoses     Localized swelling of both lower extremities    -  Primary   Relevant Medications   furosemide (LASIX) 40 MG tablet   Pain in right axilla       Relevant Orders   MM Digital Diagnostic Bilat   US BREAST COMPLETE UNI RIGHT INC AXILLA   Ambulatory referral to Sports Medicine        Meds ordered this encounter  Medications   furosemide (LASIX) 40 MG tablet    Sig: Take 1 tablet (40 mg total) by mouth daily.    Dispense:  30 tablet    Refill:  3   methimazole (TAPAZOLE) 5 MG tablet    Sig: Take 1 tablet (5 mg total) by mouth 3 (three) times daily.    Dispense:  90 tablet    Refill:  1    I, Emily Held, DO, personally preformed the services described in this documentation.  All medical record entries made by the scribe were at my direction and  in my presence.  I have reviewed the chart and discharge instructions (if applicable) and agree that the record reflects my personal performance and is accurate and complete. 03/13/2022   I,Tinashe Williams,acting as a scribe for Emily Held, DO.,have documented all relevant documentation on the behalf of Emily Held, DO,as directed by  Emily Held, DO while in the presence of Emily Held, DO.    Emily Held, DO

## 2022-03-13 NOTE — Assessment & Plan Note (Signed)
Increase methimazole 5 mg bid amd we may increase to tid Will reach out to endo

## 2022-03-13 NOTE — Patient Instructions (Signed)
Hyperthyroidism  Hyperthyroidism is when the thyroid gland is too active (overactive). The thyroid gland is a small gland located in the lower front part of the neck, just in front of the windpipe (trachea). This gland makes hormones that help control how the body uses food for energy (metabolism) as well as how the heart and brain function. These hormones also play a role in keeping your bones strong. When the thyroid is overactive, it produces too much of a hormone called thyroxine. What are the causes? This condition may be caused by: Graves' disease. This is a disorder in which the body's disease-fighting system (immune system) attacks the thyroid gland. This is the most common cause. Inflammation of the thyroid gland. A tumor in the thyroid gland. Use of certain medicines, including: Prescription thyroid hormone replacement. Herbal supplements that mimic thyroid hormones. Amiodarone therapy. Solid or fluid-filled lumps within your thyroid gland (thyroid nodules). Taking in a large amount of iodine from foods or medicines. What increases the risk? You are more likely to develop this condition if: You are female. You have a family history of thyroid conditions. You smoke tobacco. You use a medicine called lithium. You take medicines that affect the immune system (immunosuppressants). What are the signs or symptoms? Symptoms of this condition include: Nervousness. Inability to tolerate heat. Unexplained weight loss. Diarrhea. Change in the texture of hair or skin. Heart skipping beats or making extra beats. Rapid heart rate. Loss of menstruation. Shaky hands. Fatigue. Restlessness. Sleep problems. Enlarged thyroid gland or a lump in the thyroid (nodule). You may also have symptoms of Graves' disease, which may include: Protruding eyes. Dry eyes. Red or swollen eyes. Problems with vision. How is this diagnosed? This condition may be diagnosed based on: Your symptoms and  medical history. A physical exam. Blood tests. Thyroid ultrasound. This test involves using sound waves to produce images of the thyroid gland. A thyroid scan. A radioactive substance is injected into a vein, and images show how much iodine is present in the thyroid. Radioactive iodine uptake test (RAIU). A small amount of radioactive iodine is given by mouth to see how much iodine the thyroid absorbs after a certain amount of time. How is this treated? Treatment depends on the cause and severity of the condition. Treatment may include: Medicines to reduce the amount of thyroid hormone your body makes. Radioactive iodine treatment (radioiodine therapy). This involves swallowing a small dose of radioactive iodine, in capsule or liquid form, to kill thyroid cells. Surgery to remove part or all of your thyroid gland. You may need to take thyroid hormone replacement medicine for the rest of your life after thyroid surgery. Medicines to help manage your symptoms. Follow these instructions at home:  Take over-the-counter and prescription medicines only as told by your health care provider. Do not use any products that contain nicotine or tobacco, such as cigarettes and e-cigarettes. If you need help quitting, ask your health care provider. Follow any instructions from your health care provider about diet. You may be instructed to limit foods that contain iodine. Keep all follow-up visits as told by your health care provider. This is important. You will need to have blood tests regularly so that your health care provider can monitor your condition. Contact a health care provider if: Your symptoms do not get better with treatment. You have a fever. You are taking thyroid hormone replacement medicine and you: Have symptoms of depression. Feel like you are tired all the time. Gain weight. Get help   right away if: You have chest pain. You have decreased alertness or a change in your awareness. You  have abdominal pain. You feel dizzy. You have a rapid heartbeat. You have an irregular heartbeat. You have difficulty breathing. Summary The thyroid gland is a small gland located in the lower front part of the neck, just in front of the windpipe (trachea). Hyperthyroidism is when the thyroid gland is too active (overactive) and produces too much of a hormone called thyroxine. The most common cause is Graves' disease, a disorder in which your immune system attacks the thyroid gland. Hyperthyroidism can cause various symptoms, such as unexplained weight loss, nervousness, inability to tolerate heat, or changes in your heartbeat. Treatment may include medicine to reduce the amount of thyroid hormone your body makes, radioiodine therapy, surgery, or medicines to manage symptoms. This information is not intended to replace advice given to you by your health care provider. Make sure you discuss any questions you have with your health care provider. Document Revised: 08/18/2021 Document Reviewed: 04/21/2020 Elsevier Patient Education  2023 [employer] Inc[employer]  

## 2022-03-14 ENCOUNTER — Other Ambulatory Visit: Payer: Self-pay | Admitting: General Surgery

## 2022-03-14 ENCOUNTER — Other Ambulatory Visit: Payer: Self-pay | Admitting: Internal Medicine

## 2022-03-14 ENCOUNTER — Other Ambulatory Visit (HOSPITAL_BASED_OUTPATIENT_CLINIC_OR_DEPARTMENT_OTHER): Payer: Self-pay

## 2022-03-14 ENCOUNTER — Other Ambulatory Visit (HOSPITAL_COMMUNITY): Payer: Self-pay

## 2022-03-14 ENCOUNTER — Other Ambulatory Visit: Payer: Self-pay | Admitting: Obstetrics and Gynecology

## 2022-03-14 MED ORDER — BUDESONIDE-FORMOTEROL FUMARATE 80-4.5 MCG/ACT IN AERO
2.0000 | INHALATION_SPRAY | Freq: Two times a day (BID) | RESPIRATORY_TRACT | 0 refills | Status: DC
  Filled 2022-03-14 – 2022-05-16 (×2): qty 10.2, 30d supply, fill #0

## 2022-03-15 NOTE — Progress Notes (Signed)
    Subjective:    CC: R axilla pain/mass  I, Molly Weber, LAT, ATC, am serving as scribe for Dr. [name].  HPI: Pt is a 61 y/o female presenting w/ c/o pain in her R axilla w/ an associated enlarged tendon-like structure.  She locates her symptoms specifically to her R axilla running into her R ant upper arm.  She notes this is tender and painful.  Unexplained weight loss fevers chills night sweats.  No breast masses.  Radiating pain: yes into her R upper arm Swelling: no Aggravating factors: pressure to the area Treatments tried: Tylenol  Diagnostic testing: R breast US and mammogram scheduled for 03/23/22  Pertinent review of Systems: No fevers or chills  Relevant historical information: Positive for diabetes.  Negative for breast cancer or DVT.   Objective:    Vitals:   03/16/22 0950  BP: 108/66  Pulse: 80  SpO2: 96%   General: Well Developed, well nourished, and in no acute distress.   MSK: Right axilla: Palpable cord along the axilla and into the inner arm/medial arm.  This is tender to palpation.  No erythema.  Slight decreased shoulder range of motion to abduction with pain.     Impression and Recommendations:    Assessment and Plan: 61 y.o. female with right axilla and medial arm tender palpable cord.  This is concerning for superficial venous thrombosis.  Plan for vascular ultrasound upper extremity today.  Her primary care provider has already ordered a diagnostic ultrasound which should be helpful.  Plan on rechecking in 2 weeks.  If the above 2 diagnostic tests are negative certainly there are more test to do including advanced imaging test such as CT scans or MRIs..  I provided my cell phone to the vascular ultrasound location as well as to the patient.   Discussed warning signs or symptoms. Please see discharge instructions. Patient expresses understanding.   The above documentation has been reviewed and is accurate and complete [name],  M.D.

## 2022-03-16 ENCOUNTER — Ambulatory Visit (HOSPITAL_COMMUNITY)
Admission: RE | Admit: 2022-03-16 | Discharge: 2022-03-16 | Disposition: A | Discharge status: Home / Self Care | Payer: No Typology Code available for payment source | Source: Ambulatory Visit | Attending: Cardiology | Admitting: Cardiology

## 2022-03-16 ENCOUNTER — Encounter: Payer: Self-pay | Admitting: Physician Assistant

## 2022-03-16 ENCOUNTER — Ambulatory Visit (INDEPENDENT_AMBULATORY_CARE_PROVIDER_SITE_OTHER): Payer: No Typology Code available for payment source | Admitting: Family Medicine

## 2022-03-16 ENCOUNTER — Encounter: Payer: Self-pay | Admitting: Family Medicine

## 2022-03-16 VITALS — BP 108/66 | HR 80 | Ht 65.0 in | Wt 230.2 lb

## 2022-03-16 DIAGNOSIS — M79621 Pain in right upper arm: Secondary | ICD-10-CM | POA: Insufficient documentation

## 2022-03-16 NOTE — Patient Instructions (Addendum)
Nice to meet you today.  Proceed to vascular ultrasound  [facility] [address] [place], [place], [place] [address]  Follow-up: after ultrasound

## 2022-03-19 NOTE — Progress Notes (Signed)
No DVT is seen as we spoke on the phone the other day

## 2022-03-23 ENCOUNTER — Other Ambulatory Visit: Payer: No Typology Code available for payment source

## 2022-03-26 ENCOUNTER — Encounter: Payer: Self-pay | Admitting: Family Medicine

## 2022-03-26 NOTE — Telephone Encounter (Signed)
Placed in folder for sig

## 2022-03-27 ENCOUNTER — Other Ambulatory Visit: Payer: No Typology Code available for payment source

## 2022-03-28 ENCOUNTER — Encounter (INDEPENDENT_AMBULATORY_CARE_PROVIDER_SITE_OTHER): Payer: Self-pay

## 2022-03-28 ENCOUNTER — Ambulatory Visit
Admission: RE | Admit: 2022-03-28 | Discharge: 2022-03-28 | Disposition: A | Discharge status: Home / Self Care | Payer: No Typology Code available for payment source | Source: Ambulatory Visit | Attending: Family Medicine | Admitting: Family Medicine

## 2022-03-28 ENCOUNTER — Ambulatory Visit: Admission: RE | Admit: 2022-03-28 | Payer: No Typology Code available for payment source | Source: Ambulatory Visit

## 2022-03-28 DIAGNOSIS — R921 Mammographic calcification found on diagnostic imaging of breast: Secondary | ICD-10-CM

## 2022-03-30 ENCOUNTER — Other Ambulatory Visit (HOSPITAL_COMMUNITY): Payer: Self-pay

## 2022-03-31 ENCOUNTER — Other Ambulatory Visit (HOSPITAL_COMMUNITY): Payer: Self-pay

## 2022-04-02 ENCOUNTER — Other Ambulatory Visit (HOSPITAL_COMMUNITY): Payer: Self-pay

## 2022-04-03 ENCOUNTER — Ambulatory Visit: Payer: No Typology Code available for payment source | Admitting: Family Medicine

## 2022-04-04 ENCOUNTER — Other Ambulatory Visit (HOSPITAL_COMMUNITY): Payer: Self-pay

## 2022-04-04 NOTE — Progress Notes (Unsigned)
   I, Peterson Lombard, LAT, ATC acting as a scribe for Emily Leader, MD.  Emily Osborne is a 61 y.o. female who presents to [facility] today for f/u R axilla and medial upper arm pain. Pt was last seen by Dr. Georgina Snell on 03/16/22 and an UE vascular US was ordered. Today, pt reports  Dx testing: 03/16/22 UE vascular US  Pertinent review of systems: ***  Relevant historical information: ***   Exam:  There were no vitals taken for this visit. General: Well Developed, well nourished, and in no acute distress.   MSK: ***    Lab and Radiology Results No results found. However, due to the size of the patient record, not all encounters were searched. Please check Results Review for a complete set of results. No results found.     Assessment and Plan: 61 y.o. female with ***   PDMP not reviewed this encounter. No orders of the defined types were placed in this encounter.  No orders of the defined types were placed in this encounter.    Discussed warning signs or symptoms. Please see discharge instructions. Patient expresses understanding.   ***

## 2022-04-05 ENCOUNTER — Ambulatory Visit (INDEPENDENT_AMBULATORY_CARE_PROVIDER_SITE_OTHER): Payer: No Typology Code available for payment source

## 2022-04-05 ENCOUNTER — Ambulatory Visit: Payer: No Typology Code available for payment source | Admitting: Family Medicine

## 2022-04-05 VITALS — BP 118/72 | HR 70 | Ht 65.0 in | Wt 230.6 lb

## 2022-04-05 DIAGNOSIS — M7989 Other specified soft tissue disorders: Secondary | ICD-10-CM

## 2022-04-05 DIAGNOSIS — M79621 Pain in right upper arm: Secondary | ICD-10-CM

## 2022-04-05 NOTE — Progress Notes (Signed)
Right humerus x-ray looks normal to radiology.  MRI should be helpful.

## 2022-04-05 NOTE — Patient Instructions (Addendum)
Thank you for coming in today.   I will order an MRI after talking with the radiologists about the best type of MRI to order. I think a MRI of the humerus with and without contrast is probably going to be the right one.   Please get an Xray today before you leave   We will talk based on the results.   If things are improving or at least not worsening we may be able to leave it alone if its reassuring.

## 2022-04-11 NOTE — Telephone Encounter (Signed)
See mychart message dated 03/09/22.

## 2022-04-13 ENCOUNTER — Other Ambulatory Visit (HOSPITAL_COMMUNITY): Payer: Self-pay

## 2022-04-17 ENCOUNTER — Other Ambulatory Visit: Payer: No Typology Code available for payment source

## 2022-04-24 ENCOUNTER — Other Ambulatory Visit: Payer: No Typology Code available for payment source

## 2022-05-03 ENCOUNTER — Telehealth: Payer: Self-pay | Admitting: Family Medicine

## 2022-05-03 ENCOUNTER — Other Ambulatory Visit: Payer: Self-pay | Admitting: Family Medicine

## 2022-05-03 DIAGNOSIS — E0590 Thyrotoxicosis, unspecified without thyrotoxic crisis or storm: Secondary | ICD-10-CM

## 2022-05-03 NOTE — Telephone Encounter (Signed)
Pt states she missed her lab appt on 9/5. There were no active orders. Please contact pt to schedule if she is due for labs.

## 2022-05-03 NOTE — Telephone Encounter (Signed)
Please advise 

## 2022-05-04 NOTE — Telephone Encounter (Signed)
Orders placed. Please schedule nurse visit

## 2022-05-07 ENCOUNTER — Encounter: Payer: Self-pay | Admitting: Family Medicine

## 2022-05-08 ENCOUNTER — Other Ambulatory Visit (INDEPENDENT_AMBULATORY_CARE_PROVIDER_SITE_OTHER): Payer: No Typology Code available for payment source

## 2022-05-08 DIAGNOSIS — E0590 Thyrotoxicosis, unspecified without thyrotoxic crisis or storm: Secondary | ICD-10-CM

## 2022-05-09 LAB — THYROID PANEL WITH TSH
Free Thyroxine Index: 1.8 (ref 1.4–3.8)
T3 Uptake: 26 % (ref 22–35)
T4, Total: 6.9 ug/dL (ref 5.1–11.9)
TSH: 0.01 mIU/L — ABNORMAL LOW (ref 0.40–4.50)

## 2022-05-12 ENCOUNTER — Encounter: Payer: Self-pay | Admitting: Family Medicine

## 2022-05-17 ENCOUNTER — Telehealth: Payer: Self-pay | Admitting: Internal Medicine

## 2022-05-17 ENCOUNTER — Other Ambulatory Visit (HOSPITAL_COMMUNITY): Payer: Self-pay

## 2022-05-17 ENCOUNTER — Other Ambulatory Visit (HOSPITAL_BASED_OUTPATIENT_CLINIC_OR_DEPARTMENT_OTHER): Payer: Self-pay

## 2022-05-17 DIAGNOSIS — J4520 Mild intermittent asthma, uncomplicated: Secondary | ICD-10-CM

## 2022-05-18 ENCOUNTER — Other Ambulatory Visit (HOSPITAL_COMMUNITY): Payer: Self-pay

## 2022-05-18 MED ORDER — ALBUTEROL SULFATE HFA 108 (90 BASE) MCG/ACT IN AERS
INHALATION_SPRAY | RESPIRATORY_TRACT | 0 refills | Status: DC
  Filled 2022-05-18: qty 6.7, 16d supply, fill #0

## 2022-05-18 NOTE — Telephone Encounter (Signed)
Dr Melvyn Novas- pt not seen since 2021  She is scheduled with you for 05/25/22  Asking for rx for symbicort and albuterol Are you okay if we send in x 1 only pending appt next wk?

## 2022-05-18 NOTE — Telephone Encounter (Signed)
Yes, just x 1 refill

## 2022-05-18 NOTE — Telephone Encounter (Signed)
Spoke with her  She states she has enough symbicort, just needing albuterol  Send rx x 1 only and will keep appt next wk

## 2022-05-21 ENCOUNTER — Encounter: Payer: Self-pay | Admitting: Internal Medicine

## 2022-05-22 ENCOUNTER — Other Ambulatory Visit (HOSPITAL_COMMUNITY): Payer: Self-pay

## 2022-05-22 MED ORDER — ALBUTEROL SULFATE HFA 108 (90 BASE) MCG/ACT IN AERS
2.0000 | INHALATION_SPRAY | Freq: Four times a day (QID) | RESPIRATORY_TRACT | 2 refills | Status: DC | PRN
  Filled 2022-05-22: qty 18, fill #0
  Filled 2022-05-28: qty 18, 25d supply, fill #0

## 2022-05-25 ENCOUNTER — Ambulatory Visit: Payer: No Typology Code available for payment source | Admitting: Internal Medicine

## 2022-05-25 ENCOUNTER — Other Ambulatory Visit (HOSPITAL_COMMUNITY): Payer: Self-pay

## 2022-05-25 ENCOUNTER — Encounter: Payer: Self-pay | Admitting: Internal Medicine

## 2022-05-25 DIAGNOSIS — R058 Other specified cough: Secondary | ICD-10-CM | POA: Diagnosis not present

## 2022-05-25 MED ORDER — ALBUTEROL SULFATE HFA 108 (90 BASE) MCG/ACT IN AERS
2.0000 | INHALATION_SPRAY | RESPIRATORY_TRACT | 2 refills | Status: DC | PRN
  Filled 2022-05-25: qty 18, 17d supply, fill #0

## 2022-05-25 NOTE — Assessment & Plan Note (Addendum)
Flared early April 2020 while on Advair 250 - d/c 01/01/2019  - 01/15/2019  After extensive coaching inhaler device,  effectiveness =    50% and doing better on symb 160 1 bid so try symb 80 2bid > sustained improvement and nl pfts - 05/25/2022  After extensive coaching inhaler device,  effectiveness =    50% (short Ti) so rec resume symb 80 2bid prn and rx cough with gerd rx/ diet/ avoid mintsFlared early April 2020 while on Advair 250 - d/c 01/01/2019  - 01/15/2019  After extensive coaching inhaler device,  effectiveness =    50% and doing better on symb 160 1 bid so try symb 80 2bid > sustained improvement and nl pfts - 05/25/2022  After extensive coaching inhaler device,  effectiveness =    50% (short Ti) so rec resume symb 80 2bid prn and rx cough with gerd rx/ diet/ avoid mints  Of the three most common causes of    recurrent severe cough, only one (GERD)  can actually contribute to/ trigger  the other two (asthma and post nasal drip syndrome)  and perpetuate the cylce of cough.  While not intuitively obvious, many patients with chronic low grade reflux do not cough until there is a primary insult that disturbs the protective epithelial barrier and exposes sensitive nerve endings.   This is typically viral but can due to PNDS and  either may apply here.   The point is that once this occurs, it is difficult to eliminate the cycle  using anything but a maximally effective acid suppression regimen at least in the short run, accompanied by an appropriate diet to address non acid GERD and control / eliminate the cough itself  With delsym and continue use the low dose symbicort   in case of component of Th-2 driven upper or lower airways inflammation (if cough responds short term only to relapse before return while will on full rx for uacs (as above), then  that would point more to asthma or eos bronchitis as alternative dx)   In terms of treating possible asthma going forward:  Based on two studies from NEJM   378; 20 p 1865 (2018) and 380 : p2020-30 (2019) in pts with mild asthma it is reasonable to use low dose symbicort eg 80 2bid "prn" flare in this setting but I emphasized this was only shown with symbicort and takes advantage of the rapid onset of action but is not the same as "rescue therapy" but can be stopped once the acute symptoms have resolved and the need for rescue has been minimized (< 2 x weekly)     F/u in 6 weeks, call sooner prn          Each maintenance medication was reviewed in detail including emphasizing most importantly the difference between maintenance and prns and under what circumstances the prns are to be triggered using an action plan format where appropriate.  Total time for H and P, chart review, counseling, reviewing hfa device(s) and generating customized AVS unique to this office visit / same day charting > 30 min for acute  pt not seen in over 2 y

## 2022-05-25 NOTE — Patient Instructions (Addendum)
When note coughing do the following:  Pepcid 20 mg after bfast and supper   When can get dexilant  Take 30-60 min before first meal of the day   GERD (REFLUX)  is an extremely common cause of respiratory symptoms just like yours , many times with no obvious heartburn at all.    It can be treated with medication, but also with lifestyle changes including elevation of the head of your bed (ideally with 6 -8inch blocks under the headboard of your bed),  Smoking cessation, avoidance of late meals, excessive alcohol, and avoid fatty foods, chocolate, peppermint, colas, red wine, and acidic juices such as orange juice.  NO MINT OR MENTHOL PRODUCTS SO NO COUGH DROPS  USE SUGARLESS CANDY INSTEAD (Jolley ranchers or Stover's or Life Savers) or even ice chips will also do - the key is to swallow to prevent all throat clearing. NO OIL BASED VITAMINS - use powdered substitutes.  Avoid fish oil when coughing.   Best cough med delsym 2tsp or mucinex dm 1200  twice daily  Plan A = Automatic = Always=    Symbicort 80 Take 2 puffs first thing in am and then another 2 puffs about 12 hours later- only taper if 100% better    Work on inhaler technique:  relax and gently blow all the way out then take a nice smooth full deep breath back in, triggering the inhaler at same time you start breathing in.  Hold breath in for at least  5 seconds if you can. Blow out symbicort  thru nose. Rinse and gargle with water when done.  If mouth or throat bother you at all,  try brushing teeth/gums/tongue with arm and hammer toothpaste/ make a slurry and gargle and spit out.       Plan B = Backup (to supplement plan A, not to replace it) Only use your albuterol inhaler as a rescue medication to be used if you can't catch your breath by resting or doing a relaxed purse lip breathing pattern.  - The less you use it, the better it will work when you need it. - Ok to use the inhaler up to 2 puffs  every 4 hours if you must but call  for appointment if use goes up over your usual need - Don't leave home without it !!  (think of it like the spare tire for your car)   Please schedule a follow up office visit in 6 weeks, call sooner if needed

## 2022-05-25 NOTE — Progress Notes (Signed)
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 onset in teenage years   variable doe/subjective wheeze/ chronic non-cardiac cp much worse sob x 6 weeks and lives with daughter from [place] who tested positive for COVID-19 but this was not disclosed prior to her arrival here as pt says "daughter's been fine since about 5/9 or 5/10 and "I don't think I have it "    referred to pulmonary clinic 01/01/2019 by Dr   Laury Axon for sob   History of Present Illness  01/01/2019  Pulmonary/ 1st office eval/Emily Osborne / maint on advair 250 dpi  Chief Complaint  Patient presents with   Pulmonary Consult    Referred by Dr Laury Axon- SOB since end of April. She has cough- non prod. She has had sweats but no fever.   Dyspnea: "only when I walk" but says  walked a mile 12/29/2018 Cough: dry daytime/ urge to clear throat is the predominant problem   saba doesn't really help rec Dexilant 60 mg    Take  30-60 min before first meal of the day and Pepcid (famotidine)  20 mg one after supper  until return to office - this is the best way to tell whether stomach acid is contributing to your problem.   GERD diet  Plan A = Automatic = stop advair and start symbicort 160 Take 2 puffs first thing in am and then another 2 puffs about 12 hours later.  Work on inhaler technique:  Plan B = Backup Only use your albuterol inhaler as a rescue medication For cough try tessalon 200 mg every 6 hours as needed  No work until return in 2 weeks and we do a  Covid Study prior to your visit  >>>> neg  01/13/2019    01/15/2019  f/u ov/Emily Osborne re: asthma symb 160 only using one puff  bid  Chief Complaint  Patient presents with   Follow-up    Breathing is much improved. No co's. She has only used her albuterol once since the last visit.    Dyspnea:  Walked x 2 miles x 2 d prior to OV  Stopped due to fatigue, not sob   Cough: none Sleeping: no resp c/o's / on side/bed flat, two pillows  SABA use: x one since last ov 02: none  rec Change symbicort 80 Take 2 puffs first thing in am and then another 2 puffs about 12 hours later.  Work on inhaler technique:     Only use your albuterol as a rescue medication    09/25/2019  f/u ov/Emily Osborne re:  Asthma on symb 80 2bid  Chief Complaint  Patient presents with   Follow-up    PFT's done today. Breathing has overall doing well. She rarely uses albuterol.   Dyspnea:  Only sob across long  parking lots Cough: none Sleeping: flat one  pillows p pepcid hs  SABA use: rarely 02: none  Rec Weight control is simply a matter of calorie balance F/u prn    05/25/2022  acute ov/Emily Osborne re: ? Astthma flare with uri   maint on nothing x one needing   Chief Complaint  Patient presents with   Acute Visit    Follow up for [place]/cough varient asthma. Pt states that the generic albuterol is not working well for her. She is currently on Symbicort and  is working well for her. She states that she has been coughing and wheezing a lot lately.   2 weeks prior to OV  onset of cough/ wheezing started back on symbicort 80 though the numbers don't add up as has only used 10 puffs  Dyspnea:  worse with coughing spells, no longer on dexilant  Cough: min white / using lots of mints Sleeping: doing fine now  SABA use: says generic alb not working  02: none  Covid status:   vax max/ infected maybe  twice      No obvious day to day or daytime variability or assoc  purulent sputum or mucus plugs or hemoptysis or cp or chest tightness, subjective wheeze or overt sinus or hb symptoms.   Sleeping fine now  without nocturnal  or early am exacerbation  of respiratory  c/o's or need for noct saba. Also denies any obvious fluctuation of symptoms with weather or environmental changes or other aggravating or alleviating factors except as outlined above   No unusual exposure hx or h/o  childhood pna/ asthma or knowledge of premature birth.  Current Allergies, Complete Past Medical History, Past Surgical History, Family History, and Social History were reviewed in [employer] record.  ROS  The following are not active complaints unless bolded Hoarseness, sore throat, dysphagia, dental problems, itching, sneezing,  nasal congestion or discharge of excess mucus or purulent secretions, ear ache,   fever, chills, sweats, unintended wt loss or wt gain, classically pleuritic or exertional cp,  orthopnea pnd or arm/hand swelling  or leg swelling, presyncope, palpitations, abdominal pain, anorexia, nausea, vomiting, diarrhea  or change in bowel habits or change in bladder habits, change in stools or change in urine, dysuria, hematuria,  rash, arthralgias, visual complaints, headache, numbness, weakness or ataxia or problems with walking or coordination,  change in mood or  memory.        Current Meds  Medication Sig   acetaminophen (TYLENOL) 500 MG tablet Take 1,000 mg by mouth every 6 (six) hours as needed for moderate pain.   albuterol (PROAIR HFA) 108 (90 Base) MCG/ACT inhaler USE 2 PUFFS EVERY 4 HOURS AS NEEDED FOR COUGH, WHEEZE OR SHORTNESS OF BREATH.   albuterol (PROVENTIL HFA) 108 (90 Base) MCG/ACT inhaler Inhale 2 puffs into the lungs every 6 (six) hours as needed for wheezing or shortness of breath.   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   azelastine (OPTIVAR) 0.05 % ophthalmic solution Place 1 drop into both eyes 2 (two) times daily as needed.   Blood Glucose Monitoring Suppl (FREESTYLE FREEDOM) KIT 1 Device by Does not apply route daily.   budesonide-formoterol  (SYMBICORT) 80-4.5 MCG/ACT inhaler Inhale 2 puffs into the lungs 2 (two) times daily, in the morning and 12 hours later.   dexlansoprazole (DEXILANT) 60 MG capsule Take 1 capsule (60 mg total) by mouth daily.   diclofenac sodium (VOLTAREN) 1 % GEL Apply 2 g topically 4 (four) times daily. Rub into affected area of foot 2 to 4 times daily   famotidine (PEPCID) 20 MG tablet One after supper   fluticasone (FLONASE) 50 MCG/ACT nasal spray USE 2 SPRAYS INTO BOTH NOSTRILS DAILY.   furosemide (LASIX) 40  MG tablet Take 1 tablet (40 mg total) by mouth daily.   glucose blood (FREESTYLE LITE) test strip USE TO CHECK BLOOD SUGAR ONCE A DAY   Iron Combinations (I.L.X. B-12) ELIX Take 1 each by mouth daily as needed (energy). 1 droplet full   levocetirizine (XYZAL) 5 MG tablet TAKE 1 TABLET BY MOUTH EVERY EVENING.   lidocaine (LIDODERM) 5 % Apply to the affected area once daily as needed for a week. **Remove patch after 12 hours**   methimazole (TAPAZOLE) 5 MG tablet Take 1 tablet (5 mg total) by mouth 3 (three) times daily.   Probiotic Product (ALIGN) 4 MG CAPS Take 1 capsule (4 mg total) by mouth daily.   sucralfate (CARAFATE) 1 g tablet Take 1 tablet by mouth every 6 hours as needed.   valACYclovir (VALTREX) 1000 MG tablet Take 1 tablet (1,000 mg total) by mouth 3 (three) times daily as needed.                 Past Medical History:  Diagnosis Date   Allergy    Anxiety    Asthma    pt has inhaler   Back pain    C. difficile colitis    Cataract    bil eyes   Chest pain    Chronic fatigue syndrome    Clostridium difficile colitis 12/13/2016   Diverticulitis    Dyspnea    Dysrhythmia    palpitations   Edema    Elevated blood pressure reading without diagnosis of hypertension    "just elevated when I'm in pain" (12/07/2015)   Fatty liver    Fibromyalgia    Ganglion of joint    right wrist   GERD (gastroesophageal reflux disease)    H. pylori infection    Headache    hx of   Helicobacter  pylori (H. pylori) infection 12/13/2016   Herpes zoster without mention of complication    HTN (hypertension)    Hyperlipidemia    Hyperthyroidism    IBS (irritable bowel syndrome)    Leg edema    Multiple drug allergies 06/18/2017   Myalgia    Nontraumatic rupture of Achilles tendon    Other acute reactions to stress    Pain in joint, shoulder region    Pain in joint, upper arm    Pain in limb    Palpitations    Personal history of other diseases of digestive system    Pneumonia    once   PONV (postoperative nausea and vomiting)    Routine screening for STI (sexually transmitted infection) 12/13/2016   S/P partial colectomy 06/18/2017   Thyroid disease    hyperthyroidism   Type 2 diabetes mellitus with hyperglycemia, without long-term current use of insulin (HCC) 08/07/2018   Vitamin B 12 deficiency          Objective:       05/25/2022        230   09/25/2019         233   01/15/19 237 lb (107.5 kg)  01/01/19 235 lb (106.6 kg)  12/23/18 233 lb 9.6 oz (106 kg)    Vital signs reviewed  05/25/2022  - Note at rest 02 sats  96% on RA   General appearance:    pleasant min hoarse amb bf, min dry cough    HEENT : Oropharynx  clear     Nasal turbinates nl    NECK :  without  apparent JVD/ palpable Nodes/TM  LUNGS: no acc muscle use,  Nl contour chest which is clear to A and P bilaterally without cough on insp or exp maneuvers   CV:  RRR  no s3 or murmur or increase in P2, and no edema   ABD:  soft and nontender with nl inspiratory excursion in the supine position. No bruits or organomegaly appreciated   MS:  Nl gait/ ext warm without deformities Or obvious joint restrictions  calf tenderness, cyanosis or clubbing    SKIN: warm and dry without lesions    NEURO:  alert, approp, nl sensorium with  no motor or cerebellar deficits apparent.         Assessment

## 2022-05-28 ENCOUNTER — Other Ambulatory Visit (HOSPITAL_COMMUNITY): Payer: Self-pay

## 2022-06-08 NOTE — Progress Notes (Deleted)
Office Visit Note  Patient: Emily Osborne             Date of Birth: 1961/04/20           MRN: [id number]             PCP: [name], DO Referring: [name], * Visit Date: 06/20/2022 Occupation: @GUAROCC @  Subjective:  No chief complaint on file.   History of Present Illness: Emily Osborne is a 61 y.o. female ***   Activities of Daily Living:  Patient reports morning stiffness for *** {minute/hour:19697}.   Patient {ACTIONS;DENIES/REPORTS:21021675::"Denies"} nocturnal pain.  Difficulty dressing/grooming: {ACTIONS;DENIES/REPORTS:21021675::"Denies"} Difficulty climbing stairs: {ACTIONS;DENIES/REPORTS:21021675::"Denies"} Difficulty getting out of chair: {ACTIONS;DENIES/REPORTS:21021675::"Denies"} Difficulty using hands for taps, buttons, cutlery, and/or writing: {ACTIONS;DENIES/REPORTS:21021675::"Denies"}  No Rheumatology ROS completed.   PMFS History:  Patient Active Problem List   Diagnosis Date Noted   Diet-controlled diabetes mellitus ([place]) 12/11/2021   Swelling 02/02/2021   Right lower quadrant abdominal pain 02/02/2021   History of COVID-19 10/17/2020   Shortness of breath 10/17/2020   COVID-19 10/12/2020   Dizziness 05/23/2020   Primary hypertension 06/11/2019   New daily persistent headache 06/11/2019   Memory loss 06/11/2019   Upper airway cough syndrome vs cough variant asthma 01/01/2019   Chest pain 12/09/2018   Prediabetes 10/07/2018   Preventative health care 08/07/2018   Anxiety 10/11/2017   S/P partial colectomy 06/18/2017   Multiple drug allergies 06/18/2017   Colitis 02/05/2017   Diarrhea 02/05/2017   Diverticular disease 25/63/8937   Helicobacter pylori (H. pylori) infection 12/13/2016   Routine screening for STI (sexually transmitted infection) 12/13/2016   Clostridium difficile colitis 12/13/2016   Diverticulitis 10/10/2016   Lower abdominal pain 09/29/2016   Palpitations 34/28/7681   Mild diastolic dysfunction  15/72/6203   Graves disease 01/02/2016   Acute diverticulitis 12/07/2015   Acute bacterial sinusitis 10/04/2015   History of colonic polyps 11/15/2014   Left shoulder pain 09/28/2013   Left-sided weakness 09/16/2013   Obesity (BMI 30-39.9) with low erv on pfts 09/25/19 09/02/2013   Myalgia 06/21/2011   POSTMENOPAUSAL STATUS 09/07/2010   PAIN IN JOINT, MULTIPLE SITES 12/27/2009   LOW BACK PAIN, CHRONIC 12/27/2009   FATIGUE 12/27/2009   VAGINITIS 11/22/2009   DYSPEPSIA&OTHER SPEC DISORDERS FUNCTION STOMACH 09/01/2009   NAUSEA 08/03/2009   FLATULENCE-GAS-BLOATING 08/03/2009   ABDOMINAL PAIN RIGHT UPPER QUADRANT 07/06/2009   SUPRAPUBIC PAIN 07/06/2009   GANGLION CYST, HX OF 07/06/2009   BACK PAIN 05/04/2009   Hyperlipidemia 09/16/2008   UNSPECIFIED MYALGIA AND MYOSITIS 09/16/2008   Precordial pain 07/27/2008   GERD 55/97/4163   HELICOBACTER PYLORI GASTRITIS, HX OF 04/05/2008   OTHER ACUTE REACTIONS TO STRESS 03/16/2008   ABDOMINAL PAIN, EPIGASTRIC 03/16/2008   SHOULDER PAIN, RIGHT 10/01/2007   ELBOW PAIN, RIGHT 10/01/2007   Pain in limb 04/14/2007   DEPENDENT EDEMA, RIGHT LEG 04/14/2007   SHINGLES 04/08/2007   Asthma 03/18/2007   GANGLION CYST, WRIST, RIGHT 03/18/2007   BUNIONECTOMY, HX OF 03/18/2007    Past Medical History:  Diagnosis Date   Allergy    Anxiety    Asthma    pt has inhaler   Back pain    C. difficile colitis    Cataract    bil eyes   Chest pain    Chronic fatigue syndrome    Clostridium difficile colitis 12/13/2016   Diverticulitis    Dyspnea    Dysrhythmia    palpitations   Edema    Elevated blood  pressure reading without diagnosis of hypertension    "just elevated when I'm in pain" (12/07/2015)   Fatty liver    Fibromyalgia    Ganglion of joint    right wrist   GERD (gastroesophageal reflux disease)    H. pylori infection    Headache    hx of   Helicobacter pylori (H. pylori) infection 12/13/2016   Herpes zoster without mention of  complication    HTN (hypertension)    Hyperlipidemia    Hyperthyroidism    IBS (irritable bowel syndrome)    Leg edema    Multiple drug allergies 06/18/2017   Myalgia    Nontraumatic rupture of Achilles tendon    Other acute reactions to stress    Pain in joint, shoulder region    Pain in joint, upper arm    Pain in limb    Palpitations    Personal history of other diseases of digestive system    Pneumonia    once   PONV (postoperative nausea and vomiting)    Routine screening for STI (sexually transmitted infection) 12/13/2016   S/P partial colectomy 06/18/2017   Thyroid disease    hyperthyroidism   Type 2 diabetes mellitus with hyperglycemia, without long-term current use of insulin ([place]) 08/07/2018   Vitamin B 12 deficiency     Family History  Problem Relation Age of Onset   Hypertension Father    Diabetes Father    Heart disease Father    Kidney disease Father        Died, 54   Hyperlipidemia Father    Leukemia Brother    Cancer Brother        myleoblastic anemia   Hyperlipidemia Mother    Hypertension Mother        Died, 22   Thyroid disease Mother        Thyroid surgery   Obesity Mother    Heart disease Mother    Pernicious anemia Sister    Thyroid disease Sister        On thyroid Rx   Lupus Daughter    Coronary artery disease Brother    Hypertension Brother    Colon cancer Neg Hx    Esophageal cancer Neg Hx    Rectal cancer Neg Hx    Stomach cancer Neg Hx    Pancreatic cancer Neg Hx    Prostate cancer Neg Hx    Colon polyps Neg Hx    Past Surgical History:  Procedure Laterality Date   ABDOMINAL HYSTERECTOMY     ACHILLES TENDON REPAIR Right 2005   BUNIONECTOMY Bilateral    Bunionectomy 1983   COLON SURGERY  01/23/2017   6 to 8 inches sigmoid colon removed   COLONOSCOPY     GANGLION CYST EXCISION Right    wrist   LEFT HEART CATH AND CORONARY ANGIOGRAPHY N/A 10/03/2016   Procedure: Left Heart Cath and Coronary Angiography;  Surgeon: Burnell Blanks, MD;  Location: [facility];  Service: Cardiovascular;  Laterality: N/A;   MENISCUS REPAIR Right 04/2014   POLYPECTOMY     TUBAL LIGATION     wisdomteeth extraction     Social History   Social History Narrative   Lives with alone.  She has two daughter.   She works as a Programme researcher, broadcasting/film/video at [employer]--- no   Right Handed   Immunization History  Administered Date(s) Administered   Hepatitis A, Adult 06/20/2017   IPV 07/01/2017   Influenza Whole 05/05/2008  Meningococcal Mcv4o 06/20/2017   PFIZER(Purple Top)SARS-COV-2 Vaccination 09/01/2019, 09/22/2019, 05/23/2020   PPD Test 07/02/2011, 07/07/2012, 07/13/2013, 07/06/2014, 08/11/2015, 09/15/2016   Pfizer Covid-19 Vaccine Bivalent Booster 16yr & up 10/19/2021   Pneumococcal Polysaccharide-23 09/21/2011   Td 09/27/1999, 09/07/2010   Tdap 07/01/2017   Typhoid Inactivated 06/17/2017   Zoster Recombinat (Shingrix) 06/10/2017, 12/16/2017   Zoster, Live 06/20/2012     Objective: Vital Signs: There were no vitals taken for this visit.   Physical Exam   Musculoskeletal Exam: ***  CDAI Exam: CDAI Score: -- Patient Global: --; Provider Global: -- Swollen: --; Tender: -- Joint Exam 06/20/2022   No joint exam has been documented for this visit   There is currently no information documented on the homunculus. Go to the Rheumatology activity and complete the homunculus joint exam.  Investigation: No additional findings.  Imaging: No results found.  Recent Labs: Lab Results  Component Value Date   WBC 3.2 (L) 03/09/2022   HGB 12.6 03/09/2022   PLT 175.0 03/09/2022   NA 139 01/23/2022   K 3.7 01/23/2022   CL 100 01/23/2022   CO2 30 01/23/2022   GLUCOSE 99 01/23/2022   BUN 16 01/23/2022   CREATININE 0.73 01/23/2022   BILITOT 0.5 01/23/2022   ALKPHOS 57 01/23/2022   AST 31 01/23/2022   ALT 38 (H) 01/23/2022   PROT 6.8 01/23/2022   ALBUMIN 3.8 01/23/2022   CALCIUM 9.6 01/23/2022   GFRAA >60  11/27/2018   QFTBGOLDPLUS NEGATIVE 01/23/2022    Speciality Comments: No specialty comments available.  Procedures:  No procedures performed Allergies: Aspirin, Gentamicin, Ibuprofen, Metronidazole, Nsaids, Doxycycline, Influenza a (h1n1) monoval vac, Loratadine, Metformin and related, Periactin [cyproheptadine], Ranitidine hcl, Sulfamethoxazole-trimethoprim, and Tramadol hcl   Assessment / Plan:     Visit Diagnoses: Positive ANA (antinuclear antibody) - 12/11/21: ANA 1:18NH, 1:320NS, RF<14, ESR 9. 01/23/22: TB gold negative, Lupus anticoagulant not detected  Polyarthralgia  Primary hypertension  History of asthma  History of gastroesophageal reflux (GERD)  Diverticular disease  History of Clostridioides difficile colitis  History of diverticulitis  Graves disease  Diet-controlled diabetes mellitus (HCC)  S/P partial colectomy  Prediabetes  History of COVID-19  History of colonic polyps  Helicobacter pylori (H. pylori) infection  History of anxiety  Orders: No orders of the defined types were placed in this encounter.  No orders of the defined types were placed in this encounter.   Face-to-face time spent with patient was *** minutes. Greater than 50% of time was spent in counseling and coordination of care.  Follow-Up Instructions: No follow-ups on file.   TOfilia Neas PA-C  Note - This record has been created using Dragon software.  Chart creation errors have been sought, but may not always  have been located. Such creation errors do not reflect on  the standard of medical care.,

## 2022-06-14 ENCOUNTER — Ambulatory Visit: Payer: No Typology Code available for payment source | Admitting: Family Medicine

## 2022-06-15 ENCOUNTER — Ambulatory Visit: Payer: No Typology Code available for payment source | Admitting: Internal Medicine

## 2022-06-15 ENCOUNTER — Encounter: Payer: Self-pay | Admitting: Internal Medicine

## 2022-06-15 VITALS — BP 128/70 | HR 76 | Ht 65.0 in | Wt 231.2 lb

## 2022-06-15 DIAGNOSIS — E0500 Thyrotoxicosis with diffuse goiter without thyrotoxic crisis or storm: Secondary | ICD-10-CM | POA: Diagnosis not present

## 2022-06-15 LAB — T4, FREE: Free T4: 0.67 ng/dL (ref 0.60–1.60)

## 2022-06-15 LAB — TSH: TSH: 0.46 u[IU]/mL (ref 0.35–5.50)

## 2022-06-15 LAB — T3, FREE: T3, Free: 3.2 pg/mL (ref 2.3–4.2)

## 2022-06-15 NOTE — Progress Notes (Signed)
She patient ID: Emily Osborne, female   DOB: 06-26-1961, 61 y.o.   MRN: [id number]  HPI  Emily Osborne is a 61 y.o.-year-old female, referred by her PCP, Dr.Lowne, for evaluation and management of his disease. I saw the pt. previously - last visit being 3 years and 8 months ago.  Reviewed and addended history: She has a history of Graves' disease based on elevated TSI antibodies in 2017.  We initially started methimazole 5 mg daily and subsequent TFTs have normalized.  She was the low dose of methimazole, 2.5 mg daily >> stopped 10/2017.  We also started atenolol 25 mg twice a day and she was able to come off but we had restart it as she had intermittent chest pain.  She previously mentioned that she developed chest pain if she stopped this (unclear if placebo component).  She had a cardiac cath in 09/2016 showing no pathology. She saw cardiology 08/2018 >> heart monitor for 2 weeks: one very brief episode that looked a little irregular, but it happened in the middle of the night and only lasted about two seconds.  This was considered normal variant.  Our last visit was in 09/2018.  She came off atenolol afterwards.  In 01/2022, a TSH returned undetectable at her visit with PCP.  This was confirmed a month later.  She had fatigue, weakness, anxiety, tremors, palpitations, fast HR (110 bpm), 20 lbs weight loss, no appetite, mental fog, frequent stools, chills. At that time, she had a lot of stress at work.  PCP got in touch with me and she was started on methimazole in 02/2022 - 5 mg 2 times a day, along with atenolol. She decreased the dose to 5 mg 1x a day and stopped atenolol in 04/2022.  She mentions that at that time, she developed lower extremity edema, which improved since then.  In the last 3 months, she gained ~10 lbs back.  I reviewed pt's thyroid tests: Component     Latest Ref Rng 01/23/2022 03/09/2022 05/08/2022  T3 Uptake     22 - 35 % 40 (H)  37 (H)  26   Thyroxine (T4)      5.1 - 11.9 mcg/dL 24.9 (H)  17.5 (H)  6.9   Free Thyroxine Index     1.4 - 3.8  10.0 (H)  6.5 (H)  1.8   TSH     0.40 - 4.50 mIU/L <0.01 (L)  <0.01 (L)  0.01 (L)     Lab Results  Component Value Date   TSH 0.01 (L) 05/08/2022   TSH <0.01 (L) 03/09/2022   TSH <0.01 (L) 01/23/2022   TSH 0.71 12/11/2021   TSH 0.79 02/02/2021   TSH 1.43 11/17/2020   TSH 1.02 05/23/2020   TSH 0.49 06/11/2019   TSH 0.87 10/07/2018   TSH 0.44 08/07/2018   FREET4 0.76 10/07/2018   FREET4 0.75 11/13/2017   FREET4 0.76 05/07/2017   FREET4 0.78 10/05/2016   FREET4 0.74 06/28/2016   FREET4 0.76 04/27/2016   FREET4 1.61 (H) 12/27/2015   FREET4 1.34 (H) 12/08/2015   FREET4 1.18 11/02/2015   FREET4 0.90 10/28/2013   T3FREE 3.1 10/07/2018   T3FREE 3.3 11/13/2017   T3FREE 2.9 05/07/2017   T3FREE 3.2 10/05/2016   T3FREE 2.8 06/28/2016   T3FREE 2.8 04/27/2016   T3FREE 5.1 (H) 12/27/2015   T3FREE 3.7 11/02/2015   T3FREE 2.9 10/28/2013   T3FREE 2.7 12/27/2009   Antithyroid antibodies: Lab Results  Component Value Date  TSI <89 05/07/2017   TSI 393 (H) 12/27/2015   Pt denies: - feeling nodules in neck - hoarseness - dysphagia - choking  Pt does have a FH of thyroid ds. In mother and sister. No FH of thyroid cancer. No h/o radiation tx to head or neck. No recent contrast studies. No steroid use. No herbal supplements. No Biotin use.  Pt. also has a history of vitamin B12 deficiency, history of sigmoidectomy due to diverticulitis, mild diastolic dysfunction, obesity, memory loss, hyperlipidemia, GERD, HTN, prediabetes: Lab Results  Component Value Date   HGBA1C 6.1 12/11/2021   HGBA1C 6.0 02/02/2021   HGBA1C 5.9 11/17/2020   HGBA1C 5.8 08/31/2019   HGBA1C 5.8 06/11/2019   HGBA1C 6.0 12/11/2018   HGBA1C 5.8 08/07/2018   HGBA1C 6.0 05/13/2018   HGBA1C 5.6 03/20/2018   HGBA1C 5.8 (H) 12/12/2017   ROS: Constitutional: + see HPI  Past Medical History:  Diagnosis Date   Allergy    Anxiety     Asthma    pt has inhaler   Back pain    C. difficile colitis    Cataract    bil eyes   Chest pain    Chronic fatigue syndrome    Clostridium difficile colitis 12/13/2016   Diverticulitis    Dyspnea    Dysrhythmia    palpitations   Edema    Elevated blood pressure reading without diagnosis of hypertension    "just elevated when I'm in pain" (12/07/2015)   Fatty liver    Fibromyalgia    Ganglion of joint    right wrist   GERD (gastroesophageal reflux disease)    H. pylori infection    Headache    hx of   Helicobacter pylori (H. pylori) infection 12/13/2016   Herpes zoster without mention of complication    HTN (hypertension)    Hyperlipidemia    Hyperthyroidism    IBS (irritable bowel syndrome)    Leg edema    Multiple drug allergies 06/18/2017   Myalgia    Nontraumatic rupture of Achilles tendon    Other acute reactions to stress    Pain in joint, shoulder region    Pain in joint, upper arm    Pain in limb    Palpitations    Personal history of other diseases of digestive system    Pneumonia    once   PONV (postoperative nausea and vomiting)    Routine screening for STI (sexually transmitted infection) 12/13/2016   S/P partial colectomy 06/18/2017   Thyroid disease    hyperthyroidism   Type 2 diabetes mellitus with hyperglycemia, without long-term current use of insulin ([place]) 08/07/2018   Vitamin B 12 deficiency    Past Surgical History:  Procedure Laterality Date   ABDOMINAL HYSTERECTOMY     ACHILLES TENDON REPAIR Right 2005   BUNIONECTOMY Bilateral    Bunionectomy 1983   COLON SURGERY  01/23/2017   6 to 8 inches sigmoid colon removed   COLONOSCOPY     GANGLION CYST EXCISION Right    wrist   LEFT HEART CATH AND CORONARY ANGIOGRAPHY N/A 10/03/2016   Procedure: Left Heart Cath and Coronary Angiography;  Surgeon: Burnell Blanks, MD;  Location: [facility];  Service: Cardiovascular;  Laterality: N/A;   MENISCUS REPAIR Right 04/2014    POLYPECTOMY     TUBAL LIGATION     wisdomteeth extraction     Social History   Socioeconomic History   Marital status: Divorced    Spouse name:  Not on file   Number of children: 2   Years of education: Not on file   Highest education level: Not on file  Occupational History   Occupation: Nurse/teacher  Tobacco Use   Smoking status: Never   Smokeless tobacco: Never  Vaping Use   Vaping Use: Never used  Substance and Sexual Activity   Alcohol use: No    Alcohol/week: 0.0 standard drinks of alcohol   Drug use: No   Sexual activity: Not Currently    Partners: Male  Other Topics Concern   Not on file  Social History Narrative   Lives with alone.  She has two daughter.   She works as a Programme researcher, broadcasting/film/video at [employer]--- no   Right Handed   Social Determinants of Radio broadcast assistant Strain: Not on file  Food Insecurity: Not on file  Transportation Needs: Not on file  Physical Activity: Not on file  Stress: Not on file  Social Connections: Not on file  Intimate Partner Violence: Not on file   Current Outpatient Medications on File Prior to Visit  Medication Sig Dispense Refill   acetaminophen (TYLENOL) 500 MG tablet Take 1,000 mg by mouth every 6 (six) hours as needed for moderate pain.     albuterol (PROAIR HFA) 108 (90 Base) MCG/ACT inhaler USE 2 PUFFS EVERY 4 HOURS AS NEEDED FOR COUGH, WHEEZE OR SHORTNESS OF BREATH. 6.7 g 0   albuterol (PROVENTIL HFA) 108 (90 Base) MCG/ACT inhaler Inhale 2 puffs into the lungs every 6 (six) hours as needed for wheezing or shortness of breath. 18 g 2   albuterol (VENTOLIN HFA) 108 (90 Base) MCG/ACT inhaler Inhale 2 puffs into the lungs every 4 (four) hours as needed for wheezing or shortness of breath. 18 g 2   ALPRAZolam (XANAX) 0.25 MG tablet TAKE 1 TABLET BY MOUTH THREE TIMES A DAY AS NEEDED 30 tablet 2   AMBULATORY NON FORMULARY MEDICATION Medication Name: Critic-Aid Market researcher  Apply to affected area as needed 30 g 11   atenolol (TENORMIN) 25 MG tablet Take 1/2 tablet (12.5 mg total) by mouth daily. 45 tablet 0   azelastine (OPTIVAR) 0.05 % ophthalmic solution Place 1 drop into both eyes 2 (two) times daily as needed.     Blood Glucose Monitoring Suppl (FREESTYLE FREEDOM) KIT 1 Device by Does not apply route daily. 1 each 0   budesonide-formoterol (SYMBICORT) 80-4.5 MCG/ACT inhaler Inhale 2 puffs into the lungs 2 (two) times daily, in the morning and 12 hours later. 10.2 g 0   dexlansoprazole (DEXILANT) 60 MG capsule Take 1 capsule (60 mg total) by mouth daily. 90 capsule 2   diclofenac sodium (VOLTAREN) 1 % GEL Apply 2 g topically 4 (four) times daily. Rub into affected area of foot 2 to 4 times daily 100 g 2   famotidine (PEPCID) 20 MG tablet One after supper 30 tablet 11   fluticasone (FLONASE) 50 MCG/ACT nasal spray USE 2 SPRAYS INTO BOTH NOSTRILS DAILY. 16 g 5   furosemide (LASIX) 40 MG tablet Take 1 tablet (40 mg total) by mouth daily. 30 tablet 3   glucose blood (FREESTYLE LITE) test strip USE TO CHECK BLOOD SUGAR ONCE A DAY 100 each 1   Iron Combinations (I.L.X. B-12) ELIX Take 1 each by mouth daily as needed (energy). 1 droplet full     levocetirizine (XYZAL) 5 MG tablet TAKE 1 TABLET BY MOUTH EVERY EVENING. 90 tablet 3  lidocaine (LIDODERM) 5 % Apply to the affected area once daily as needed for a week. **Remove patch after 12 hours** 30 patch 0   methimazole (TAPAZOLE) 5 MG tablet Take 1 tablet (5 mg total) by mouth 3 (three) times daily. 90 tablet 1   Probiotic Product (ALIGN) 4 MG CAPS Take 1 capsule (4 mg total) by mouth daily. 90 capsule 3   sucralfate (CARAFATE) 1 g tablet Take 1 tablet by mouth every 6 hours as needed. 60 tablet 3   valACYclovir (VALTREX) 1000 MG tablet Take 1 tablet (1,000 mg total) by mouth 3 (three) times daily as needed. 30 tablet 2   No current facility-administered medications on file prior to visit.   Allergies   Allergen Reactions   Aspirin Other (See Comments)    REACTION: anaphylaxis   Gentamicin Other (See Comments)    Eye drops turned the sclera bright red   Ibuprofen Other (See Comments)    REACTION: anaphylaxsis   Metronidazole Swelling    REACTION: red face/swelling   Nsaids Other (See Comments)    REACTION: anaphylaxis   Doxycycline Other (See Comments)    REACTION: severe nausea/vomiting   Influenza A (H1n1) Monoval Vac Other (See Comments)    REACTION: sick for 3 weeks   Loratadine Other (See Comments)    Fatigue/weakness   Metformin And Related Other (See Comments)    Memory issues and headache   Periactin [Cyproheptadine] Other (See Comments)    paranoid    Ranitidine Hcl Other (See Comments)    REACTION: Lips turned red /peel   Sulfamethoxazole-Trimethoprim Other (See Comments)    REACTION: face red/peel   Tramadol Hcl Other (See Comments)    REACTION: paranoid   Family History  Problem Relation Age of Onset   Hypertension Father    Diabetes Father    Heart disease Father    Kidney disease Father        Died, 21   Hyperlipidemia Father    Leukemia Brother    Cancer Brother        myleoblastic anemia   Hyperlipidemia Mother    Hypertension Mother        Died, 36   Thyroid disease Mother        Thyroid surgery   Obesity Mother    Heart disease Mother    Pernicious anemia Sister    Thyroid disease Sister        On thyroid Rx   Lupus Daughter    Coronary artery disease Brother    Hypertension Brother    Colon cancer Neg Hx    Esophageal cancer Neg Hx    Rectal cancer Neg Hx    Stomach cancer Neg Hx    Pancreatic cancer Neg Hx    Prostate cancer Neg Hx    Colon polyps Neg Hx     PE: BP 128/70 (BP Location: Left Arm, Patient Position: Sitting, Cuff Size: Normal)   Pulse 76   Ht _0  (1.651 m)   Wt 231 lb 3.2 oz (104.9 kg)   SpO2 98%   BMI 38.47 kg/m  Wt Readings from Last 3 Encounters:  06/15/22 231 lb 3.2 oz (104.9 kg)  05/25/22 230 lb 12.8  oz (104.7 kg)  04/05/22 230 lb 9.6 oz (104.6 kg)   Constitutional: overweight, in NAD Eyes:  EOMI, no exophthalmos ENT: no neck masses, no cervical lymphadenopathy Cardiovascular: RRR, No MRG Respiratory: CTA B Musculoskeletal: no deformities Skin:no rashes Neurological: no tremor with outstretched hands  ASSESSMENT: 1.  Graves disease - recurrent  PLAN:  1. Patient with a history of Graves' disease (diagnosed based on elevated TSI's), resolved before our last visit in 09/2018.  She was initially on methimazole, which we were able to stop in 10/2017 with subsequent normal TFTs and undetectable TSI's.  She was followed by PCP afterwards and the thyroid tests were controlled repeatedly, until 01/2022, when the patient insisted that TFTs were checked because she started to feel poorly: She had fatigue, weakness, anxiety, tremors, palpitations, 20 pound weight loss, lack of appetite, mental fog, frequent stools, and chills.  TFTs are thyrotoxic, with undetectable TSH.  This pattern was confirmed a month later.   -She was restarted on MMI 5 mg 2 times a day, on which TFTs improved with normal free thyroid hormones and detectable TSH at last check 4 weeks ago >> she decreased the MMI dose by herself 2 weeks ago to 5 mg 1x day.  She feels well on this dose, with most of her symptoms resolved. -We discussed at this visit that she had a Graves' disease recurrence, improved on methimazole - will check the TSH, fT3 and fT4 and also add thyroid stimulating antibodies to check the activity of her Graves' disease.  - we discussed about possible modalities of treatment for the above conditions, to include methimazole use, radioactive iodine ablation or (last resort) surgery.  Since she is responding well to methimazole, we can continue this for now, but we may need to continue a low-dose rather than stopping the medication even if the tests improve. - I do not feel that we need to add beta blockers at this  time, since she is not tachycardic or tremulous and especially since she had leg swelling, which I suspect were due to atenolol, not to methimazole (upon her questioning) - no signs of Graves' ophthalmopathy: she does not have any double vision, blurry vision, eye pain, chemosis. - also, I do not feel thyroid nodularity or enlarged lymph nodes on palpation of her neck and she does not have neck compression symptoms - RTC in 4 months, but likely sooner for repeat labs  Office Visit on 06/15/2022  Component Date Value Ref Range Status   TSH 06/15/2022 0.46  0.35 - 5.50 uIU/mL Final   Free T4 06/15/2022 0.67  0.60 - 1.60 ng/dL Final   Comment: Specimens from patients who are undergoing biotin therapy and /or ingesting biotin supplements may contain high levels of biotin.  The higher biotin concentration in these specimens interferes with this Free T4 assay.  Specimens that contain high levels  of biotin may cause false high results for this Free T4 assay.  Please interpret results in light of the total clinical presentation of the patient.     T3, Free 06/15/2022 3.2  2.3 - 4.2 pg/mL Final   Msg sent: Dear [name], Your thyroid function tests are now normal.  Lets stay on the same dose of methimazole for now, 5 mg daily, and plan to repeat the tests in 4-5 weeks. Please call our main office number [phone]) to schedule a lab appointment.  I am still waiting for the Graves' antibodies to return. Sincerely, Philemon Kingdom MD  Component     Latest Ref Rng 06/15/2022  TSI     <140 % baseline 449 (H)   TSIs are again elevated.  We definitely need to continue methimazole for now.  Philemon Kingdom, MD PhD [facility]

## 2022-06-15 NOTE — Patient Instructions (Addendum)
Please stop at the lab.  Please continue methimazole 5 mg 1 time a day for now.  Please come back for a follow-up appointment in 4 months.

## 2022-06-19 ENCOUNTER — Encounter: Payer: No Typology Code available for payment source | Admitting: Psychology

## 2022-06-19 LAB — THYROID STIMULATING IMMUNOGLOBULIN: TSI: 449 % baseline — ABNORMAL HIGH (ref <140)

## 2022-06-20 ENCOUNTER — Ambulatory Visit: Payer: No Typology Code available for payment source | Attending: Rheumatology | Admitting: Rheumatology

## 2022-06-20 DIAGNOSIS — E119 Type 2 diabetes mellitus without complications: Secondary | ICD-10-CM

## 2022-06-20 DIAGNOSIS — A048 Other specified bacterial intestinal infections: Secondary | ICD-10-CM

## 2022-06-20 DIAGNOSIS — E0500 Thyrotoxicosis with diffuse goiter without thyrotoxic crisis or storm: Secondary | ICD-10-CM

## 2022-06-20 DIAGNOSIS — R768 Other specified abnormal immunological findings in serum: Secondary | ICD-10-CM

## 2022-06-20 DIAGNOSIS — Z8616 Personal history of COVID-19: Secondary | ICD-10-CM

## 2022-06-20 DIAGNOSIS — M2550 Pain in unspecified joint: Secondary | ICD-10-CM

## 2022-06-20 DIAGNOSIS — I10 Essential (primary) hypertension: Secondary | ICD-10-CM

## 2022-06-20 DIAGNOSIS — K5790 Diverticulosis of intestine, part unspecified, without perforation or abscess without bleeding: Secondary | ICD-10-CM

## 2022-06-20 DIAGNOSIS — Z8659 Personal history of other mental and behavioral disorders: Secondary | ICD-10-CM

## 2022-06-20 DIAGNOSIS — Z86010 Personal history of colonic polyps: Secondary | ICD-10-CM

## 2022-06-20 DIAGNOSIS — Z8719 Personal history of other diseases of the digestive system: Secondary | ICD-10-CM

## 2022-06-20 DIAGNOSIS — Z9049 Acquired absence of other specified parts of digestive tract: Secondary | ICD-10-CM

## 2022-06-20 DIAGNOSIS — R7303 Prediabetes: Secondary | ICD-10-CM

## 2022-06-20 DIAGNOSIS — Z8619 Personal history of other infectious and parasitic diseases: Secondary | ICD-10-CM

## 2022-06-20 DIAGNOSIS — Z8709 Personal history of other diseases of the respiratory system: Secondary | ICD-10-CM

## 2022-06-22 ENCOUNTER — Emergency Department (HOSPITAL_BASED_OUTPATIENT_CLINIC_OR_DEPARTMENT_OTHER): Payer: No Typology Code available for payment source

## 2022-06-22 ENCOUNTER — Encounter (HOSPITAL_BASED_OUTPATIENT_CLINIC_OR_DEPARTMENT_OTHER): Payer: Self-pay | Admitting: Urology

## 2022-06-22 ENCOUNTER — Emergency Department (HOSPITAL_COMMUNITY): Payer: No Typology Code available for payment source

## 2022-06-22 ENCOUNTER — Inpatient Hospital Stay (HOSPITAL_BASED_OUTPATIENT_CLINIC_OR_DEPARTMENT_OTHER)
Admission: EM | Admit: 2022-06-22 | Discharge: 2022-06-23 | DRG: 066 | Disposition: A | Discharge status: Home / Self Care | Payer: No Typology Code available for payment source | Attending: Internal Medicine | Admitting: Internal Medicine

## 2022-06-22 DIAGNOSIS — E669 Obesity, unspecified: Secondary | ICD-10-CM | POA: Diagnosis present

## 2022-06-22 DIAGNOSIS — K219 Gastro-esophageal reflux disease without esophagitis: Secondary | ICD-10-CM | POA: Diagnosis present

## 2022-06-22 DIAGNOSIS — Z833 Family history of diabetes mellitus: Secondary | ICD-10-CM | POA: Diagnosis not present

## 2022-06-22 DIAGNOSIS — Z8249 Family history of ischemic heart disease and other diseases of the circulatory system: Secondary | ICD-10-CM | POA: Diagnosis not present

## 2022-06-22 DIAGNOSIS — F419 Anxiety disorder, unspecified: Secondary | ICD-10-CM | POA: Diagnosis present

## 2022-06-22 DIAGNOSIS — Z881 Allergy status to other antibiotic agents status: Secondary | ICD-10-CM | POA: Diagnosis not present

## 2022-06-22 DIAGNOSIS — M7502 Adhesive capsulitis of left shoulder: Secondary | ICD-10-CM | POA: Diagnosis present

## 2022-06-22 DIAGNOSIS — Z8349 Family history of other endocrine, nutritional and metabolic diseases: Secondary | ICD-10-CM

## 2022-06-22 DIAGNOSIS — G9332 Myalgic encephalomyelitis/chronic fatigue syndrome: Secondary | ICD-10-CM | POA: Diagnosis present

## 2022-06-22 DIAGNOSIS — E0500 Thyrotoxicosis with diffuse goiter without thyrotoxic crisis or storm: Secondary | ICD-10-CM | POA: Diagnosis present

## 2022-06-22 DIAGNOSIS — K589 Irritable bowel syndrome without diarrhea: Secondary | ICD-10-CM | POA: Diagnosis present

## 2022-06-22 DIAGNOSIS — R519 Headache, unspecified: Secondary | ICD-10-CM | POA: Diagnosis present

## 2022-06-22 DIAGNOSIS — M797 Fibromyalgia: Secondary | ICD-10-CM | POA: Diagnosis present

## 2022-06-22 DIAGNOSIS — Z90710 Acquired absence of both cervix and uterus: Secondary | ICD-10-CM

## 2022-06-22 DIAGNOSIS — R29701 NIHSS score 1: Secondary | ICD-10-CM | POA: Diagnosis present

## 2022-06-22 DIAGNOSIS — I6389 Other cerebral infarction: Secondary | ICD-10-CM | POA: Diagnosis not present

## 2022-06-22 DIAGNOSIS — I6340 Cerebral infarction due to embolism of unspecified cerebral artery: Secondary | ICD-10-CM | POA: Diagnosis present

## 2022-06-22 DIAGNOSIS — E538 Deficiency of other specified B group vitamins: Secondary | ICD-10-CM | POA: Diagnosis present

## 2022-06-22 DIAGNOSIS — E039 Hypothyroidism, unspecified: Secondary | ICD-10-CM | POA: Diagnosis present

## 2022-06-22 DIAGNOSIS — R739 Hyperglycemia, unspecified: Secondary | ICD-10-CM

## 2022-06-22 DIAGNOSIS — I10 Essential (primary) hypertension: Secondary | ICD-10-CM | POA: Diagnosis present

## 2022-06-22 DIAGNOSIS — Z6838 Body mass index (BMI) 38.0-38.9, adult: Secondary | ICD-10-CM

## 2022-06-22 DIAGNOSIS — Z882 Allergy status to sulfonamides status: Secondary | ICD-10-CM

## 2022-06-22 DIAGNOSIS — E1165 Type 2 diabetes mellitus with hyperglycemia: Secondary | ICD-10-CM | POA: Diagnosis present

## 2022-06-22 DIAGNOSIS — Z7951 Long term (current) use of inhaled steroids: Secondary | ICD-10-CM

## 2022-06-22 DIAGNOSIS — Z888 Allergy status to other drugs, medicaments and biological substances status: Secondary | ICD-10-CM

## 2022-06-22 DIAGNOSIS — Z83438 Family history of other disorder of lipoprotein metabolism and other lipidemia: Secondary | ICD-10-CM

## 2022-06-22 DIAGNOSIS — G43909 Migraine, unspecified, not intractable, without status migrainosus: Secondary | ICD-10-CM | POA: Diagnosis present

## 2022-06-22 DIAGNOSIS — Z885 Allergy status to narcotic agent status: Secondary | ICD-10-CM

## 2022-06-22 DIAGNOSIS — E119 Type 2 diabetes mellitus without complications: Secondary | ICD-10-CM

## 2022-06-22 DIAGNOSIS — Z886 Allergy status to analgesic agent status: Secondary | ICD-10-CM

## 2022-06-22 DIAGNOSIS — Z9049 Acquired absence of other specified parts of digestive tract: Secondary | ICD-10-CM | POA: Diagnosis not present

## 2022-06-22 DIAGNOSIS — I639 Cerebral infarction, unspecified: Secondary | ICD-10-CM | POA: Diagnosis present

## 2022-06-22 DIAGNOSIS — R29700 NIHSS score 0: Secondary | ICD-10-CM | POA: Diagnosis not present

## 2022-06-22 DIAGNOSIS — Z887 Allergy status to serum and vaccine status: Secondary | ICD-10-CM

## 2022-06-22 DIAGNOSIS — E785 Hyperlipidemia, unspecified: Secondary | ICD-10-CM | POA: Diagnosis present

## 2022-06-22 DIAGNOSIS — Z8619 Personal history of other infectious and parasitic diseases: Secondary | ICD-10-CM

## 2022-06-22 DIAGNOSIS — Z79899 Other long term (current) drug therapy: Secondary | ICD-10-CM

## 2022-06-22 LAB — URINALYSIS, ROUTINE W REFLEX MICROSCOPIC
Bilirubin Urine: NEGATIVE
Glucose, UA: NEGATIVE mg/dL
Hgb urine dipstick: NEGATIVE
Ketones, ur: NEGATIVE mg/dL
Leukocytes,Ua: NEGATIVE
Nitrite: NEGATIVE
Protein, ur: NEGATIVE mg/dL
Specific Gravity, Urine: <=1.005 (ref 1.005–1.030)
pH: 6 (ref 5.0–8.0)

## 2022-06-22 LAB — CBC
HCT: 43.4 % (ref 36.0–46.0)
Hemoglobin: 14.4 g/dL (ref 12.0–15.0)
MCH: 28 pg (ref 26.0–34.0)
MCHC: 33.2 g/dL (ref 30.0–36.0)
MCV: 84.3 fL (ref 80.0–100.0)
Platelets: 200 10*3/uL (ref 150–400)
RBC: 5.15 MIL/uL — ABNORMAL HIGH (ref 3.87–5.11)
RDW: 15.9 % — ABNORMAL HIGH (ref 11.5–15.5)
WBC: 6.3 10*3/uL (ref 4.0–10.5)
nRBC: 0 % (ref 0.0–0.2)

## 2022-06-22 LAB — COMPREHENSIVE METABOLIC PANEL
ALT: 21 U/L (ref 0–44)
AST: 22 U/L (ref 15–41)
Albumin: 4 g/dL (ref 3.5–5.0)
Alkaline Phosphatase: 82 U/L (ref 38–126)
Anion gap: 5 (ref 5–15)
BUN: 11 mg/dL (ref 8–23)
CO2: 29 mmol/L (ref 22–32)
Calcium: 9.2 mg/dL (ref 8.9–10.3)
Chloride: 106 mmol/L (ref 98–111)
Creatinine, Ser: 0.81 mg/dL (ref 0.44–1.00)
GFR, Estimated: >60 mL/min (ref >60)
Glucose, Bld: 89 mg/dL (ref 70–99)
Potassium: 4.1 mmol/L (ref 3.5–5.1)
Sodium: 140 mmol/L (ref 135–145)
Total Bilirubin: 0.6 mg/dL (ref 0.3–1.2)
Total Protein: 7.5 g/dL (ref 6.5–8.1)

## 2022-06-22 LAB — DIFFERENTIAL
Abs Immature Granulocytes: 0.01 10*3/uL (ref 0.00–0.07)
Basophils Absolute: 0 10*3/uL (ref 0.0–0.1)
Basophils Relative: 1 %
Eosinophils Absolute: 0 10*3/uL (ref 0.0–0.5)
Eosinophils Relative: 1 %
Immature Granulocytes: 0 %
Lymphocytes Relative: 44 %
Lymphs Abs: 2.8 10*3/uL (ref 0.7–4.0)
Monocytes Absolute: 0.5 10*3/uL (ref 0.1–1.0)
Monocytes Relative: 8 %
Neutro Abs: 3 10*3/uL (ref 1.7–7.7)
Neutrophils Relative %: 46 %

## 2022-06-22 LAB — ETHANOL: Alcohol, Ethyl (B): <10 mg/dL (ref <10)

## 2022-06-22 LAB — PROTIME-INR
INR: 1 (ref 0.8–1.2)
Prothrombin Time: 12.6 seconds (ref 11.4–15.2)

## 2022-06-22 LAB — CBG MONITORING, ED
Glucose-Capillary: 88 mg/dL (ref 70–99)
Glucose-Capillary: 95 mg/dL (ref 70–99)

## 2022-06-22 LAB — RAPID URINE DRUG SCREEN, HOSP PERFORMED
Amphetamines: NOT DETECTED
Barbiturates: NOT DETECTED
Benzodiazepines: NOT DETECTED
Cocaine: NOT DETECTED
Opiates: NOT DETECTED
Tetrahydrocannabinol: NOT DETECTED

## 2022-06-22 LAB — APTT: aPTT: 21 seconds — ABNORMAL LOW (ref 24–36)

## 2022-06-22 MED ORDER — INSULIN ASPART 100 UNIT/ML IJ SOLN: 0.0000 [IU] | Freq: Every day | INTRAMUSCULAR | Status: DC

## 2022-06-22 MED ORDER — IOHEXOL 350 MG/ML SOLN
75.0000 mL | Freq: Once | INTRAVENOUS | Status: AC | PRN
  Administered 2022-06-22: 75 mL via INTRAVENOUS

## 2022-06-22 MED ORDER — INSULIN ASPART 100 UNIT/ML IJ SOLN: 0.0000 [IU] | Freq: Three times a day (TID) | INTRAMUSCULAR | Status: DC

## 2022-06-22 NOTE — ED Triage Notes (Signed)
Pt states dull headache that started at 1145, took tylenol 650 mg, states left side of face became numb and tingling, states lightheaded, denies LOC, numbness intermittent  H/o migraines  BP was high at work 160/102, hypertensive at this time NAD at this time, PERRLA 4 bilat

## 2022-06-22 NOTE — ED Provider Notes (Signed)
[facility] Provider Note   CSN: [id number] Arrival date & time: 06/22/22  1240     History {Add pertinent medical, surgical, social history, OB history to HPI:1} Chief Complaint  Patient presents with   Headache   Numbness    Emily Osborne is a 61 y.o. female with past medical history significant for fibromyalgia, acid reflux, hyperlipidemia, hypertension, diabetes, anxiety who presents with concern for headache, lightheadedness, numbness, tingling, questionable weakness of left side of face, left arm, left leg.  Blood pressure was high at work blood pressure of 160/82.  Patient denies any difficulty speaking, confusion, double vision, nausea, vomiting.  She does have a history of migraines.  Patient reports that she has some frozen shoulder on the left, but this numbness, weakness feels slightly different.   Headache      Home Medications Prior to Admission medications   Medication Sig Start Date End Date Taking? Authorizing Provider  acetaminophen (TYLENOL) 500 MG tablet Take 1,000 mg by mouth every 6 (six) hours as needed for moderate pain.    [provider]  albuterol (PROAIR HFA) 108 (90 Base) MCG/ACT inhaler USE 2 PUFFS EVERY 4 HOURS AS NEEDED FOR COUGH, WHEEZE OR SHORTNESS OF BREATH. 05/18/22   Tanda Rockers, MD  albuterol (PROVENTIL HFA) 108 (90 Base) MCG/ACT inhaler Inhale 2 puffs into the lungs every 6 (six) hours as needed for wheezing or shortness of breath. 05/22/22   Tanda Rockers, MD  albuterol (VENTOLIN HFA) 108 (90 Base) MCG/ACT inhaler Inhale 2 puffs into the lungs every 4 (four) hours as needed for wheezing or shortness of breath. 05/25/22   Tanda Rockers, MD  ALPRAZolam Duanne Moron) 0.25 MG tablet TAKE 1 TABLET BY MOUTH THREE TIMES A DAY AS NEEDED 11/05/19   Carollee Herter, Alferd Apa, DO  AMBULATORY NON FORMULARY MEDICATION Medication Name: Critic-Aid Dentist  Apply to  affected area as needed 12/17/19   Armbruster, Carlota Raspberry, MD  atenolol (TENORMIN) 25 MG tablet Take 1/2 tablet (12.5 mg total) by mouth daily. Patient not taking: Reported on 06/15/2022 03/07/22   Carollee Herter, Alferd Apa, DO  azelastine (OPTIVAR) 0.05 % ophthalmic solution Place 1 drop into both eyes 2 (two) times daily as needed. Patient not taking: Reported on 06/15/2022    [provider]  Blood Glucose Monitoring Suppl (FREESTYLE FREEDOM) KIT 1 Device by Does not apply route daily. 06/08/14   [name], DO  budesonide-formoterol (SYMBICORT) 80-4.5 MCG/ACT inhaler Inhale 2 puffs into the lungs 2 (two) times daily, in the morning and 12 hours later. 03/14/22   Tanda Rockers, MD  dexlansoprazole (DEXILANT) 60 MG capsule Take 1 capsule (60 mg total) by mouth daily. 10/26/21   Armbruster, Carlota Raspberry, MD  diclofenac sodium (VOLTAREN) 1 % GEL Apply 2 g topically 4 (four) times daily. Rub into affected area of foot 2 to 4 times daily 10/02/18   Trula Slade, DPM  famotidine (PEPCID) 20 MG tablet One after supper 01/01/19   Tanda Rockers, MD  fluticasone (FLONASE) 50 MCG/ACT nasal spray USE 2 SPRAYS INTO BOTH NOSTRILS DAILY. 07/24/21   [name], DO  furosemide (LASIX) 40 MG tablet Take 1 tablet (40 mg total) by mouth daily. 03/13/22   [name] R, DO  glucose blood (FREESTYLE LITE) test strip USE TO CHECK BLOOD SUGAR ONCE A DAY 09/11/21   [name], DO  Iron Combinations (I.L.X.  B-12) ELIX Take 1 each by mouth daily as needed (energy). 1 droplet full    [provider]  levocetirizine (XYZAL) 5 MG tablet TAKE 1 TABLET BY MOUTH EVERY EVENING. 11/30/21 11/30/22  Carollee Herter, Yvonne R, DO  lidocaine (LIDODERM) 5 % Apply to the affected area once daily as needed for a week. **Remove patch after 12 hours** 01/29/22   Carollee Herter, Alferd Apa, DO  methimazole (TAPAZOLE) 5 MG tablet Take 1 tablet (5 mg total) by mouth 3 (three) times daily. Patient taking  differently: Take 5 mg by mouth once. 03/13/22   [name], DO  Probiotic Product (ALIGN) 4 MG CAPS Take 1 capsule (4 mg total) by mouth daily. 08/01/21   [name] R, DO  sucralfate (CARAFATE) 1 g tablet Take 1 tablet by mouth every 6 hours as needed. 10/23/21   Armbruster, Carlota Raspberry, MD  valACYclovir (VALTREX) 1000 MG tablet Take 1 tablet (1,000 mg total) by mouth 3 (three) times daily as needed. 06/28/21   [name], DO      Allergies    Aspirin, Gentamicin, Ibuprofen, Metronidazole, Nsaids, Doxycycline, Influenza a (h1n1) monoval vac, Loratadine, Metformin and related, Periactin [cyproheptadine], Ranitidine hcl, Sulfamethoxazole-trimethoprim, and Tramadol hcl    Review of Systems   Review of Systems  Neurological:  Positive for headaches.  All other systems reviewed and are negative.   Physical Exam Updated Vital Signs BP (!) 158/84   Pulse (!) 58   Temp 97.7 F (36.5 C) (Oral)   Resp 18   Ht _0  (1.651 m)   Wt 104.8 kg   SpO2 98%   BMI 38.45 kg/m  Physical Exam Vitals and nursing note reviewed.  Constitutional:      General: She is not in acute distress.    Appearance: Normal appearance.  HENT:     Head: Normocephalic and atraumatic.  Eyes:     General:        Right eye: No discharge.        Left eye: No discharge.  Cardiovascular:     Rate and Rhythm: Normal rate and regular rhythm.     Heart sounds: No murmur heard.    No friction rub. No gallop.  Pulmonary:     Effort: Pulmonary effort is normal.     Breath sounds: Normal breath sounds.  Abdominal:     General: Bowel sounds are normal.     Palpations: Abdomen is soft.  Skin:    General: Skin is warm and dry.     Capillary Refill: Capillary refill takes less than 2 seconds.  Neurological:     Mental Status: She is alert and oriented to person, place, and time.     Comments: Cranial nerves II through XII grossly intact.  Intact finger-nose, intact heel-to-shin.  Romberg  negative, gait normal.  Alert and oriented x3.  Moves all 4 limbs spontaneously, normal coordination.  No pronator drift.  Intact strength 5 out of 5 bilateral upper and lower extremities.  Subjective difference in sensation on the left compared to right. Subjective weakness not seen on exam.  Psychiatric:        Mood and Affect: Mood normal.        Behavior: Behavior normal.     ED Results / Procedures / Treatments   Labs (all labs ordered are listed, but only abnormal results are displayed) Labs Reviewed  APTT - Abnormal; Notable for the following components:      Result  Value   aPTT 21 (*)    All other components within normal limits  CBC - Abnormal; Notable for the following components:   RBC 5.15 (*)    RDW 15.9 (*)    All other components within normal limits  PROTIME-INR  DIFFERENTIAL  ETHANOL  COMPREHENSIVE METABOLIC PANEL  RAPID URINE DRUG SCREEN, HOSP PERFORMED  URINALYSIS, ROUTINE W REFLEX MICROSCOPIC  CBG MONITORING, ED    EKG None  Radiology No results found.  Procedures Procedures  {Document cardiac monitor, telemetry assessment procedure when appropriate:1}  Medications Ordered in ED Medications - No data to display  ED Course/ Medical Decision Making/ A&P                           Medical Decision Making Amount and/or Complexity of Data Reviewed Labs: ordered. Radiology: ordered.   ***  {Document critical care time when appropriate:1} {Document review of labs and clinical decision tools ie heart score, Chads2Vasc2 etc:1}  {Document your independent review of radiology images, and any outside records:1} {Document your discussion with family members, caretakers, and with consultants:1} {Document social determinants of health affecting pt's care:1} {Document your decision making why or why not admission, treatments were needed:1} Final Clinical Impression(s) / ED Diagnoses Final diagnoses:  None    Rx / DC Orders ED Discharge Orders      None

## 2022-06-22 NOTE — H&P (Signed)
History and Physical    Patient: Emily Osborne SVX:[id number] DOB: 07-12-1961 DOA: 06/22/2022 DOS: the patient was seen and examined on 06/22/2022 PCP: [name], DO  Patient coming from: Home  Chief Complaint:  Chief Complaint  Patient presents with   Headache   Numbness   HPI: [name] is a 61 y.o. female with medical history significant of diet-controlled type 2 diabetes, multidrug allergies, essential hypertension, hyperlipidemia, hypothyroidism, chronic fatigue syndrome, GERD, morbid obesity, irritable bowel syndrome who was sent over from [facility] for MRI after presentation there with sudden onset of headache late morning followed by numbness on the left side of her face.  No focal weakness.  Patient suspected to have had TIA versus CVA.  Was sent over to have MRI of the brain.  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  This resolved patient is being admitted to the hospital to complete work-up.  Neurology already consulted.  Patient otherwise hemodynamically stable.  Review of Systems: As mentioned in the history of present illness. All other systems reviewed and are negative. Past Medical History:  Diagnosis Date   Allergy    Anxiety    Asthma    pt has inhaler   Back pain    C. difficile colitis    Cataract    bil eyes   Chest pain    Chronic fatigue syndrome    Clostridium difficile colitis 12/13/2016   Diverticulitis    Dyspnea    Dysrhythmia    palpitations   Edema    Elevated blood pressure reading without diagnosis of hypertension    "just elevated when I'm in pain" (12/07/2015)   Fatty liver    Fibromyalgia    Ganglion of joint    right wrist   GERD (gastroesophageal reflux disease)    H. pylori infection    Headache    hx of   Helicobacter pylori (H. pylori) infection 12/13/2016   Herpes zoster without mention of complication    HTN (hypertension)    Hyperlipidemia    Hyperthyroidism    IBS  (irritable bowel syndrome)    Leg edema    Multiple drug allergies 06/18/2017   Myalgia    Nontraumatic rupture of Achilles tendon    Other acute reactions to stress    Pain in joint, shoulder region    Pain in joint, upper arm    Pain in limb    Palpitations    Personal history of other diseases of digestive system    Pneumonia    once   PONV (postoperative nausea and vomiting)    Routine screening for STI (sexually transmitted infection) 12/13/2016   S/P partial colectomy 06/18/2017   Thyroid disease    hyperthyroidism   Type 2 diabetes mellitus with hyperglycemia, without long-term current use of insulin ([place]) 08/07/2018   Vitamin B 12 deficiency    Past Surgical History:  Procedure Laterality Date   ABDOMINAL HYSTERECTOMY     ACHILLES TENDON REPAIR Right 2005   BUNIONECTOMY Bilateral    Bunionectomy 1983   COLON SURGERY  01/23/2017   6 to 8 inches sigmoid colon removed   COLONOSCOPY     GANGLION CYST EXCISION Right    wrist   LEFT HEART CATH AND CORONARY ANGIOGRAPHY N/A 10/03/2016   Procedure: Left Heart Cath and Coronary Angiography;  Surgeon: Burnell Blanks, MD;  Location: [facility];  Service: Cardiovascular;  Laterality: N/A;   MENISCUS REPAIR Right  04/2014   POLYPECTOMY     TUBAL LIGATION     wisdomteeth extraction     Social History:  reports that she has never smoked. She has never used smokeless tobacco. She reports that she does not drink alcohol and does not use drugs.  Allergies  Allergen Reactions   Aspirin Other (See Comments)    REACTION: anaphylaxis   Gentamicin Other (See Comments)    Eye drops turned the sclera bright red   Ibuprofen Other (See Comments)    REACTION: anaphylaxsis   Metronidazole Swelling    REACTION: red face/swelling   Nsaids Other (See Comments)    REACTION: anaphylaxis   Doxycycline Other (See Comments)    REACTION: severe nausea/vomiting   Influenza A (H1n1) Mon[place] Vac Other (See Comments)    REACTION:  sick for 3 weeks   Loratadine Other (See Comments)    Fatigue/weakness   Metformin And Related Other (See Comments)    Memory issues and headache   Periactin [Cyproheptadine] Other (See Comments)    paranoid    Ranitidine Hcl Other (See Comments)    REACTION: Lips turned red /peel   Sulfamethoxazole-Trimethoprim Other (See Comments)    REACTION: face red/peel   Tramadol Hcl Other (See Comments)    REACTION: paranoid    Family History  Problem Relation Age of Onset   Hypertension Father    Diabetes Father    Heart disease Father    Kidney disease Father        Died, 67   Hyperlipidemia Father    Leukemia Brother    Cancer Brother        myleoblastic anemia   Hyperlipidemia Mother    Hypertension Mother        Died, 58   Thyroid disease Mother        Thyroid surgery   Obesity Mother    Heart disease Mother    Pernicious anemia Sister    Thyroid disease Sister        On thyroid Rx   Lupus Daughter    Coronary artery disease Brother    Hypertension Brother    Colon cancer Neg Hx    Esophageal cancer Neg Hx    Rectal cancer Neg Hx    Stomach cancer Neg Hx    Pancreatic cancer Neg Hx    Prostate cancer Neg Hx    Colon polyps Neg Hx     Prior to Admission medications   Medication Sig Start Date End Date Taking? Authorizing Provider  acetaminophen (TYLENOL) 500 MG tablet Take 1,000 mg by mouth every 6 (six) hours as needed for moderate pain.    [provider]  albuterol (PROAIR HFA) 108 (90 Base) MCG/ACT inhaler USE 2 PUFFS EVERY 4 HOURS AS NEEDED FOR COUGH, WHEEZE OR SHORTNESS OF BREATH. 05/18/22   Tanda Rockers, MD  albuterol (PROVENTIL HFA) 108 (90 Base) MCG/ACT inhaler Inhale 2 puffs into the lungs every 6 (six) hours as needed for wheezing or shortness of breath. 05/22/22   Tanda Rockers, MD  albuterol (VENTOLIN HFA) 108 (90 Base) MCG/ACT inhaler Inhale 2 puffs into the lungs every 4 (four) hours as needed for wheezing or shortness of breath. 05/25/22    Tanda Rockers, MD  ALPRAZolam Duanne Moron) 0.25 MG tablet TAKE 1 TABLET BY MOUTH THREE TIMES A DAY AS NEEDED 11/05/19   Carollee Herter, Alferd Apa, DO  AMBULATORY NON FORMULARY MEDICATION Medication Name: [facility]  Apply to  affected area as needed 12/17/19   Armbruster, Carlota Raspberry, MD  atenolol (TENORMIN) 25 MG tablet Take 1/2 tablet (12.5 mg total) by mouth daily. Patient not taking: Reported on 06/15/2022 03/07/22   Carollee Herter, Alferd Apa, DO  azelastine (OPTIVAR) 0.05 % ophthalmic solution Place 1 drop into both eyes 2 (two) times daily as needed. Patient not taking: Reported on 06/15/2022    [provider]  Blood Glucose Monitoring Suppl (FREESTYLE FREEDOM) KIT 1 Device by Does not apply route daily. 06/08/14   [name], DO  budesonide-formoterol (SYMBICORT) 80-4.5 MCG/ACT inhaler Inhale 2 puffs into the lungs 2 (two) times daily, in the morning and 12 hours later. 03/14/22   Tanda Rockers, MD  dexlansoprazole (DEXILANT) 60 MG capsule Take 1 capsule (60 mg total) by mouth daily. 10/26/21   Armbruster, Carlota Raspberry, MD  diclofenac sodium (VOLTAREN) 1 % GEL Apply 2 g topically 4 (four) times daily. Rub into affected area of foot 2 to 4 times daily 10/02/18   Trula Slade, DPM  famotidine (PEPCID) 20 MG tablet One after supper 01/01/19   Tanda Rockers, MD  fluticasone (FLONASE) 50 MCG/ACT nasal spray USE 2 SPRAYS INTO BOTH NOSTRILS DAILY. 07/24/21   [name], DO  furosemide (LASIX) 40 MG tablet Take 1 tablet (40 mg total) by mouth daily. 03/13/22   [name] R, DO  glucose blood (FREESTYLE LITE) test strip USE TO CHECK BLOOD SUGAR ONCE A DAY 09/11/21   [name], DO  Iron Combinations (I.L.X. B-12) ELIX Take 1 each by mouth daily as needed (energy). 1 droplet full    [provider]  levocetirizine (XYZAL) 5 MG tablet TAKE 1 TABLET BY MOUTH EVERY EVENING. 11/30/21 11/30/22  Carollee Herter, Yvonne R, DO  lidocaine (LIDODERM) 5 % Apply to the affected area once daily as needed for a week. **Remove patch after 12 hours** 01/29/22   Carollee Herter, Alferd Apa, DO  methimazole (TAPAZOLE) 5 MG tablet Take 1 tablet (5 mg total) by mouth 3 (three) times daily. Patient taking differently: Take 5 mg by mouth once. 03/13/22   [name], DO  Probiotic Product (ALIGN) 4 MG CAPS Take 1 capsule (4 mg total) by mouth daily. 08/01/21   [name] R, DO  sucralfate (CARAFATE) 1 g tablet Take 1 tablet by mouth every 6 hours as needed. 10/23/21   Armbruster, Carlota Raspberry, MD  valACYclovir (VALTREX) 1000 MG tablet Take 1 tablet (1,000 mg total) by mouth 3 (three) times daily as needed. 06/28/21   [name], DO    Physical Exam: Vitals:   06/22/22 1628 06/22/22 1645 06/22/22 1700 06/22/22 2113  BP: (!) 159/91 131/81 139/81 (!) 140/88  Pulse: 62 64 61 64  Resp: _0 Temp: 97.7 F (36.5 C)   98 F (36.7 C)  TempSrc: Oral   Oral  SpO2: 98% 99% 99% 99%  Weight:      Height:       Constitutional: Morbidly obese, NAD, calm, comfortable Eyes: PERRL, lids and conjunctivae normal ENMT: Mucous membranes are moist. Posterior pharynx clear of any exudate or lesions.Normal dentition.  Neck: normal, supple, no masses, no thyromegaly Respiratory: clear to auscultation bilaterally, no wheezing, no crackles. Normal respiratory effort. No accessory muscle use.  Cardiovascular: Regular rate and rhythm, no murmurs / rubs / gallops. No extremity edema. 2+ pedal pulses. No carotid bruits.  Abdomen: no tenderness, no  masses palpated. No hepatosplenomegaly. Bowel sounds positive.  Musculoskeletal: Good range of motion, no joint swelling or tenderness, Skin: no rashes, lesions, ulcers. No induration Neurologic: CN 2-12 grossly intact. Sensation intact, DTR normal. Strength 5/5 in all 4.  Psychiatric: Normal judgment and insight. Alert and oriented x 3. Normal mood  Data  Reviewed:  BNP was normal CBC within normal, urine drug screen negative.  CT angiogram ordered.  Head CT without contrast negative MRI of the brain showed bilateral embolic stroke  Assessment and Plan:  #1 acute CVA: Bilateral embolic CVA.  Patient will be admitted.  Check echocardiogram, CT angiogram of the head and neck.    #2 essential hypertension: Continue blood pressure monitoring.  Confirm home regimen and continue.  #3 diet-controlled diabetes: Initiate sliding scale insulin.  #4 GERD: Continue PPIs.  #5 hyperlipidemia: Continue statin  #6 morbid obesity: Continue dietary consult.   Advance Care Planning:   Code Status: Prior full code  Consults: Dr. Malen Gauze, neurologist  Family Communication: No family at bedside  Severity of Illness: The appropriate patient status for this patient is INPATIENT. Inpatient status is judged to be reasonable and necessary in order to provide the required intensity of service to ensure the patient's safety. The patient's presenting symptoms, physical exam findings, and initial radiographic and laboratory data in the context of their chronic comorbidities is felt to place them at high risk for further clinical deterioration. Furthermore, it is not anticipated that the patient will be medically stable for discharge from the hospital within 2 midnights of admission.   * I certify that at the point of admission it is my clinical judgment that the patient will require inpatient hospital care spanning beyond 2 midnights from the point of admission due to high intensity of service, high risk for further deterioration and high frequency of surveillance required.*  AuthorBarbette Merino, MD 06/22/2022 10:54 PM  For on call review [email or web address].

## 2022-06-22 NOTE — ED Notes (Signed)
Re paged Neurology at 3:48

## 2022-06-22 NOTE — ED Provider Notes (Addendum)
Patient's evaluation started at [facility].  She reports she had a sudden dull headache late morning followed by numbness on the left side of her face.  Patient was transferred to [facility] for MRI to rule out CVA. Physical Exam  BP (!) 140/88   Pulse 64   Temp 98 F (36.7 C) (Oral)   Resp 16   Ht '5\' 5"'$  (1.651 m)   Wt 104.8 kg   SpO2 99%   BMI 38.45 kg/m   Physical Exam Constitutional:      Appearance: Normal appearance.  HENT:     Head: Normocephalic and atraumatic.     Mouth/Throat:     Pharynx: Oropharynx is clear.  Eyes:     Extraocular Movements: Extraocular movements intact.  Cardiovascular:     Rate and Rhythm: Normal rate and regular rhythm.  Pulmonary:     Effort: Pulmonary effort is normal.     Breath sounds: Normal breath sounds.  Musculoskeletal:        General: No swelling or tenderness. Normal range of motion.     Right lower leg: No edema.     Left lower leg: No edema.  Skin:    General: Skin is warm and dry.  Neurological:     General: No focal deficit present.     Mental Status: She is alert and oriented to person, place, and time.     Motor: No weakness.     Coordination: Coordination normal.  Psychiatric:        Mood and Affect: Mood normal.     Procedures  Procedures  ED Course / MDM    Medical Decision Making Amount and/or Complexity of Data Reviewed Labs: ordered. Radiology: ordered.  Risk Decision regarding hospitalization.   MRI returned with small acute infarcts suggestive of embolic source.  22: 00 I have reassessed the patient.  She reports she has no symptoms now.  She reports she feels back to normal.  She does not have headache.  She does not Dors any visual changes.  She reports numbness has resolved.  Upper and lower extremity strength is intact.  Patient is updated on MRI findings.  Will consult neurology and anticipate admission for stroke work-up.  22:30 Consult Dr. Rory Percy. Get CTAs and admit to medicine  for  stroke w/u       Charlesetta Shanks, MD 06/22/22 2204    Charlesetta Shanks, MD 06/22/22 2242

## 2022-06-22 NOTE — Discharge Instructions (Signed)
Please present to the [facility] emergency department and tell the staff that you are there for an ED to ED transfer for MRI.  The doctor who is aware that you are coming is Dr. Johnney Killian.

## 2022-06-22 NOTE — ED Notes (Signed)
Out to MRI

## 2022-06-23 ENCOUNTER — Inpatient Hospital Stay (HOSPITAL_COMMUNITY): Payer: No Typology Code available for payment source

## 2022-06-23 ENCOUNTER — Other Ambulatory Visit: Payer: Self-pay

## 2022-06-23 DIAGNOSIS — I6389 Other cerebral infarction: Secondary | ICD-10-CM | POA: Diagnosis not present

## 2022-06-23 DIAGNOSIS — I639 Cerebral infarction, unspecified: Secondary | ICD-10-CM | POA: Diagnosis not present

## 2022-06-23 LAB — ECHOCARDIOGRAM COMPLETE
AR max vel: 2.74 cm2
AV Area VTI: 2.89 cm2
AV Area mean vel: 2.72 cm2
AV Mean grad: 4 mmHg
AV Peak grad: 7 mmHg
Ao pk vel: 1.32 m/s
Area-P 1/2: 3.42 cm2
Height: 65 in
MV VTI: 2.68 cm2
S' Lateral: 2.8 cm
Weight: 3696.67 oz

## 2022-06-23 LAB — HEMOGLOBIN A1C
Hgb A1c MFr Bld: 5.6 % (ref 4.8–5.6)
Mean Plasma Glucose: 114.02 mg/dL

## 2022-06-23 LAB — HIV ANTIBODY (ROUTINE TESTING W REFLEX): HIV Screen 4th Generation wRfx: NONREACTIVE

## 2022-06-23 LAB — LIPID PANEL
Cholesterol: 163 mg/dL (ref 0–200)
HDL: 50 mg/dL (ref >40)
LDL Cholesterol: 95 mg/dL (ref 0–99)
Total CHOL/HDL Ratio: 3.3 RATIO
Triglycerides: 89 mg/dL (ref <150)
VLDL: 18 mg/dL (ref 0–40)

## 2022-06-23 LAB — CBG MONITORING, ED
Glucose-Capillary: 104 mg/dL — ABNORMAL HIGH (ref 70–99)
Glucose-Capillary: 73 mg/dL (ref 70–99)

## 2022-06-23 MED ORDER — SODIUM CHLORIDE 0.9 % IV SOLN: INTRAVENOUS | Status: DC

## 2022-06-23 MED ORDER — ATORVASTATIN CALCIUM 40 MG PO TABS: 40.0000 mg | ORAL_TABLET | Freq: Every day | ORAL | Status: DC

## 2022-06-23 MED ORDER — ATORVASTATIN CALCIUM 40 MG PO TABS
40.0000 mg | ORAL_TABLET | Freq: Every day | ORAL | 1 refills | Status: DC
  Filled 2022-06-23: qty 30, 30d supply, fill #0
  Filled 2022-07-23: qty 30, 30d supply, fill #1

## 2022-06-23 MED ORDER — DIPHENHYDRAMINE HCL 50 MG/ML IJ SOLN: 50.0000 mg | Freq: Once | INTRAMUSCULAR | Status: DC

## 2022-06-23 MED ORDER — SENNOSIDES-DOCUSATE SODIUM 8.6-50 MG PO TABS: 1.0000 | ORAL_TABLET | Freq: Every evening | ORAL | Status: DC | PRN

## 2022-06-23 MED ORDER — ACETAMINOPHEN 325 MG PO TABS
650.0000 mg | ORAL_TABLET | ORAL | Status: DC | PRN
  Administered 2022-06-23 (×3): 650 mg via ORAL
  Filled 2022-06-23 (×3): qty 2

## 2022-06-23 MED ORDER — ENOXAPARIN SODIUM 40 MG/0.4ML IJ SOSY
40.0000 mg | PREFILLED_SYRINGE | INTRAMUSCULAR | Status: DC
  Filled 2022-06-23: qty 0.4

## 2022-06-23 MED ORDER — CLOPIDOGREL BISULFATE 75 MG PO TABS: 75.0000 mg | ORAL_TABLET | Freq: Every day | ORAL | Status: DC

## 2022-06-23 MED ORDER — CLOPIDOGREL BISULFATE 75 MG PO TABS
75.0000 mg | ORAL_TABLET | Freq: Every day | ORAL | Status: DC
  Administered 2022-06-23: 75 mg via ORAL
  Filled 2022-06-23: qty 1

## 2022-06-23 MED ORDER — CLOPIDOGREL BISULFATE 75 MG PO TABS
75.0000 mg | ORAL_TABLET | Freq: Every day | ORAL | 2 refills | Status: DC
  Filled 2022-06-23: qty 30, 30d supply, fill #0
  Filled 2022-07-23: qty 30, 30d supply, fill #1
  Filled 2022-08-27: qty 30, 30d supply, fill #2

## 2022-06-23 MED ORDER — ACETAMINOPHEN 160 MG/5ML PO SOLN: 650.0000 mg | ORAL | Status: DC | PRN

## 2022-06-23 MED ORDER — ACETAMINOPHEN 650 MG RE SUPP: 650.0000 mg | RECTAL | Status: DC | PRN

## 2022-06-23 MED ORDER — STROKE: EARLY STAGES OF RECOVERY BOOK
Freq: Once | Status: AC
  Filled 2022-06-23: qty 1

## 2022-06-23 MED ORDER — METHIMAZOLE 5 MG PO TABS: 5.0000 mg | ORAL_TABLET | Freq: Once | ORAL | Status: DC

## 2022-06-23 MED ORDER — DIPHENHYDRAMINE HCL 25 MG PO CAPS: 50.0000 mg | ORAL_CAPSULE | Freq: Once | ORAL | Status: DC

## 2022-06-23 MED ORDER — CLOPIDOGREL BISULFATE 75 MG PO TABS
75.0000 mg | ORAL_TABLET | Freq: Every day | ORAL | Status: DC
  Filled 2022-06-23 (×2): qty 1

## 2022-06-23 MED ORDER — STROKE: EARLY STAGES OF RECOVERY BOOK: Freq: Once | Status: DC

## 2022-06-23 MED ORDER — PREDNISONE 5 MG PO TABS: 50.0000 mg | ORAL_TABLET | Freq: Four times a day (QID) | ORAL | Status: DC

## 2022-06-23 MED ORDER — METHIMAZOLE 5 MG PO TABS
5.0000 mg | ORAL_TABLET | Freq: Every day | ORAL | Status: DC
  Administered 2022-06-23: 5 mg via ORAL
  Filled 2022-06-23: qty 1

## 2022-06-23 NOTE — Progress Notes (Deleted)
PROGRESS NOTE    Emily Osborne  JJK:[id number] DOB: 1960-10-21 DOA: 06/22/2022 PCP: [name], DO   Brief Narrative:  Emily Osborne is a 61 y.o. female with medical history significant of diet-controlled type 2 diabetes, multidrug allergies, essential hypertension, hyperlipidemia, hypothyroidism, chronic fatigue syndrome, GERD, morbid obesity, irritable bowel syndrome who was sent over from [place] for MRI after presentation there with sudden onset of headache late morning followed by numbness on the left side of her face.  No focal weaknesses noted.    Assessment & Plan:   Principal Problem:   Acute cerebrovascular accident (CVA) (HCC) Active Problems:   Hyperlipidemia   GERD   Obesity (BMI 30-39.9) with low erv on pfts 09/25/19   Primary hypertension   Diet-controlled diabetes mellitus (HCC)   Acute CVA:  - Diffuse bilateral embolic appearing infarcts on MRI confirming acute CVA - Patient reports history of palpitations but no formal diagnosis of A-fib or flutter, reports wearing Holter monitor previously without any acute findings.   - Anaphylaxis to aspirin - continue plavix alone. - Neurology following - appreciate insight/recs   - Echo pending - CTA negative for occlusion/stenosis - PT/OT eval pending   Essential hypertension:  Home med rec pending, blood pressure currently well controlled   Diet-controlled diabetes type 2 - well controlled:  Continues to scale insulin, hypoglycemic protocol    GERD: Continue PPIs. Hyperlipidemia: Continue statin Morbid obesity: Discussed low-fat diet/exercise regimen  DVT prophylaxis: Lovenox, patient currently refusing recommending early ambulation and SCDs Code Status: Full Family Communication: None present  Status is: Inpatient  Dispo: The patient is from: Home              Anticipated d/c is to: To be determined              Anticipated d/c date is: 24 to 48 hours              Patient  currently not medically stable for discharge given ongoing need for neurological work-up and evaluation in the setting of acute stroke  Consultants:  Neurology  Procedures:  None  Antimicrobials:  None indicated  Subjective: No acute issues or events overnight, paresthesias and weakness appear to resolved.  Otherwise denies nausea vomiting diarrhea constipation headache fevers chills or chest pain  Objective: Vitals:   06/22/22 1700 06/22/22 2113 06/23/22 0224 06/23/22 0437  BP: 139/81 (!) 140/88 129/85 113/70  Pulse: 61 64 81 73  Resp: 14 16 16 14   Temp:  98 F (36.7 C)  98 F (36.7 C)  TempSrc:  Oral  Oral  SpO2: 99% 99% 100% 97%  Weight:      Height:       No intake or output data in the 24 hours ending 06/23/22 0728 Filed Weights   06/22/22 1245  Weight: 104.8 kg    Examination:  General:  Pleasantly resting in bed, No acute distress. HEENT:  Normocephalic atraumatic.  Sclerae nonicteric, noninjected.  Extraocular movements intact bilaterally. Neck:  Without mass or deformity.  Trachea is midline. Lungs:  Clear to auscultate bilaterally without rhonchi, wheeze, or rales. Heart:  Regular rate and rhythm.  Without murmurs, rubs, or gallops. Abdomen:  Soft, nontender, nondistended.  Without guarding or rebound. Extremities: Without cyanosis, clubbing, edema, or obvious deformity. Vascular:  Dorsalis pedis and posterior tibial pulses palpable bilaterally. Skin:  Warm and dry, no erythema, no ulcerations.  Data Reviewed: I have personally reviewed following labs and imaging studies  CBC: Recent Labs  Lab 06/22/22 1315  WBC 6.3  NEUTROABS 3.0  HGB 14.4  HCT 43.4  MCV 84.3  PLT 200   Basic Metabolic Panel: Recent Labs  Lab 06/22/22 1329  NA 140  K 4.1  CL 106  CO2 29  GLUCOSE 89  BUN 11  CREATININE 0.81  CALCIUM 9.2   GFR: Estimated Creatinine Clearance: 86.5 mL/min (by C-G formula based on SCr of 0.81 mg/dL). Liver Function Tests: Recent Labs   Lab 06/22/22 1329  AST 22  ALT 21  ALKPHOS 82  BILITOT 0.6  PROT 7.5  ALBUMIN 4.0   No results for input(s): "LIPASE", "AMYLASE" in the last 168 hours. No results for input(s): "AMMONIA" in the last 168 hours. Coagulation Profile: Recent Labs  Lab 06/22/22 1315  INR 1.0   Cardiac Enzymes: No results for input(s): "CKTOTAL", "CKMB", "CKMBINDEX", "TROPONINI" in the last 168 hours. BNP (last 3 results) No results for input(s): "PROBNP" in the last 8760 hours. HbA1C: Recent Labs    06/23/22 0504  HGBA1C 5.6   CBG: Recent Labs  Lab 06/22/22 1313 06/22/22 2348  GLUCAP 95 88   Lipid Profile: Recent Labs    06/23/22 0504  CHOL 163  HDL 50  LDLCALC 95  TRIG 89  CHOLHDL 3.3   Thyroid Function Tests: No results for input(s): "TSH", "T4TOTAL", "FREET4", "T3FREE", "THYROIDAB" in the last 72 hours. Anemia Panel: No results for input(s): "VITAMINB12", "FOLATE", "FERRITIN", "TIBC", "IRON", "RETICCTPCT" in the last 72 hours. Sepsis Labs: No results for input(s): "PROCALCITON", "LATICACIDVEN" in the last 168 hours.  No results found for this or any previous visit (from the past 240 hour(s)).       Radiology Studies: CT ANGIO HEAD NECK W WO CM  Result Date: 06/22/2022 CLINICAL DATA:  Follow-up examination for stroke. EXAM: CT ANGIOGRAPHY HEAD AND NECK TECHNIQUE: Multidetector CT imaging of the head and neck was performed using the standard protocol during bolus administration of intravenous contrast. Multiplanar CT image reconstructions and MIPs were obtained to evaluate the vascular anatomy. Carotid stenosis measurements (when applicable) are obtained utilizing NASCET criteria, using the distal internal carotid diameter as the denominator. RADIATION DOSE REDUCTION: This exam was performed according to the departmental dose-optimization program which includes automated exposure control, adjustment of the mA and/or kV according to patient size and/or use of iterative  reconstruction technique. CONTRAST:  75mL OMNIPAQUE IOHEXOL 350 MG/ML SOLN COMPARISON:  Prior CT and MRI from earlier the same day. FINDINGS: CT HEAD FINDINGS Brain: Previous identified punctate infarcts not visible by CT. No acute intracranial hemorrhage. No other acute large vessel territory infarct. No mass lesion or midline shift. No hydrocephalus or extra-axial fluid collection. Vascular: No hyperdense vessel. Scattered vascular calcifications noted within the carotid siphons. Skull: Scalp soft tissues and calvarium demonstrate no acute finding. Sinuses/Orbits: Globes orbital soft tissues within normal limits. Paranasal sinuses and mastoid air cells are clear. Other: None. Review of the MIP images confirms the above findings CTA NECK FINDINGS Aortic arch: Visualized aortic arch normal caliber with standard 3 vessel morphology. No stenosis about the origin the great vessels. Right carotid system: Right common and internal carotid arteries are tortuous but widely patent without stenosis or dissection. Left carotid system: Left common and internal carotid arteries are tortuous but widely patent without stenosis or dissection. Vertebral arteries: Both vertebral arteries arise from the subclavian arteries. No proximal subclavian artery stenosis. Vertebral arteries are tortuous but widely patent without stenosis or dissection. Skeleton: No discrete or worrisome  osseous lesions. Other neck: No other acute soft tissue abnormality within the neck. Upper chest: Visualized upper chest demonstrates no acute finding. Review of the MIP images confirms the above findings CTA HEAD FINDINGS Anterior circulation: Both internal carotid arteries patent to the termini without stenosis. A1 segments, anterior communicating complex common anterior cerebral arteries widely patent. No M1 stenosis or occlusion. No proximal MCA branch occlusion or high-grade stenosis. Distal MCA branches perfused and symmetric. Posterior circulation: Both  V4 segments widely patent. Right PICA patent. Left PICA not well seen. Basilar widely patent without stenosis. Superior cerebral arteries patent bilaterally. Left PCA supplied via the basilar. Fetal type origin right PCA. Both PCAs widely patent to their distal aspects without stenosis. Venous sinuses: Patent allowing for timing the contrast bolus. Anatomic variants: As above.  No aneurysm. Review of the MIP images confirms the above findings IMPRESSION: 1. Negative CTA of the head and neck. No large vessel occlusion or other emergent finding. No hemodynamically significant or correctable stenosis. 2. Diffuse tortuosity of the major arterial vasculature of the head and neck, suggesting chronic underlying hypertension. Electronically Signed   By: Rise Mu M.D.   On: 06/22/2022 23:39   MR BRAIN WO CONTRAST  Result Date: 06/22/2022 CLINICAL DATA:  Initial evaluation for acute stroke. EXAM: MRI HEAD WITHOUT CONTRAST TECHNIQUE: Multiplanar, multiecho pulse sequences of the brain and surrounding structures were obtained without intravenous contrast. COMPARISON:  Comparison made with prior CT from earlier same day as well as previous MRI from 06/03/2020. FINDINGS: Brain: Cerebral volume stable, and remains within normal limits for age. Scattered patchy T2/FLAIR hyperintensity involving the periventricular deep white matter both cerebral hemispheres, most likely related chronic microvascular ischemic disease, mild in nature. Few punctate foci of restricted diffusion seen involving the cortical gray matter of both cerebral hemispheres, with involvement of the bilateral parietal lobes (series 2, image 40) and right frontal lobe (series 2, image 32). Findings consistent with small acute ischemic infarcts, presumably embolic. No associated hemorrhage or mass effect. No other evidence for acute or subacute ischemia. Gray-white matter differentiation otherwise maintained. No acute or chronic intracranial blood  products. No mass lesion, midline shift or mass effect. No hydrocephalus or extra-axial fluid collection. Pituitary gland and suprasellar region within normal limits. Vascular: Major intracranial vascular flow voids are well maintained. Skull and upper cervical spine: Craniocervical junction within normal limits. Bone marrow signal intensity normal. No scalp soft tissue abnormality. Sinuses/Orbits: Globes orbital soft tissues demonstrate no acute finding. Mild scattered mucosal thickening noted about the ethmoidal air cells and maxillary sinuses. No significant mastoid effusion. Other: None. IMPRESSION: 1. Few punctate acute ischemic infarcts involving the cortical gray matter of both cerebral hemispheres, presumably embolic. No associated hemorrhage or mass effect. 2. Underlying mild chronic microvascular ischemic disease, stable. Electronically Signed   By: Rise Mu M.D.   On: 06/22/2022 21:10   CT Head Wo Contrast  Result Date: 06/22/2022 CLINICAL DATA:  Headache started at 11:45. Left-sided facial numbness and tingling. EXAM: CT HEAD WITHOUT CONTRAST TECHNIQUE: Contiguous axial images were obtained from the base of the skull through the vertex without intravenous contrast. RADIATION DOSE REDUCTION: This exam was performed according to the departmental dose-optimization program which includes automated exposure control, adjustment of the mA and/or kV according to patient size and/or use of iterative reconstruction technique. COMPARISON:  06/03/2020 FINDINGS: Brain: No evidence of acute infarction, hemorrhage, hydrocephalus, extra-axial collection or mass lesion/mass effect. Vascular: No hyperdense vessel or unexpected calcification. Skull: No osseous abnormality. Sinuses/Orbits: Visualized  paranasal sinuses are clear. Visualized mastoid sinuses are clear. Visualized orbits demonstrate no focal abnormality. Other: None IMPRESSION: 1. No acute intracranial findings. Electronically Signed   By: Elige Ko M.D.   On: 06/22/2022 14:26     Scheduled Meds:   stroke: early stages of recovery book   Does not apply Once   [START ON 06/24/2022]  stroke: early stages of recovery book   Does not apply Once   clopidogrel  75 mg Oral Daily   enoxaparin (LOVENOX) injection  40 mg Subcutaneous Q24H   insulin aspart  0-15 Units Subcutaneous TID WC   insulin aspart  0-5 Units Subcutaneous QHS   Continuous Infusions:  sodium chloride 100 mL/hr at 06/23/22 0118    LOS: 1 day   Time spent:  Azucena Fallen, DO [facility]  If 7PM-7AM, please contact night-coverage [email or web address]  06/23/2022, 7:28 AM

## 2022-06-23 NOTE — Evaluation (Signed)
Physical Therapy Evaluation and Discharge Patient Details Name: Emily Osborne MRN: [id number] DOB: [birth year] Today's Date: 06/23/2022  History of Present Illness  Emily Osborne is a 61 y.o. female presented to the emergency room for evaluation of headache lightheadedness as well as numbness and tingling on the left face, left shoulder and then left arm and leg which has since resolved; MRI examination of the brain-few punctate acute ischemic infarcts involving cortical gray matter in both cerebral hemispheres-suspect underlying embolic etiology;  past medical history of palpitations, fibromyalgia, chronic headaches, hypertension, hyperlipidemia, type 2 diabetes, B12 deficiency, Graves' disease,  Clinical Impression   Patient evaluated by Physical Therapy with no further acute PT needs identified. All education has been completed and the patient has no further questions. She plans to continue her stroke workup as Outpt.  See below for any follow-up Physical Therapy or equipment needs. PT is signing off. Thank you for this referral.        Recommendations for follow up therapy are one component of a multi-disciplinary discharge planning process, led by the attending physician.  Recommendations may be updated based on patient status, additional functional criteria and insurance authorization.  Follow Up Recommendations No PT follow up      Assistance Recommended at Discharge None  Patient can return home with the following       Equipment Recommendations None recommended by PT  Recommendations for Other Services       Functional Status Assessment       Precautions / Restrictions Precautions Precautions: None Restrictions Weight Bearing Restrictions: No      Mobility  Bed Mobility Overal bed mobility: Independent                  Transfers Overall transfer level: Independent Equipment used: None                    Ambulation/Gait Ambulation/Gait  assistance: Independent Gait Distance (Feet): 400 Feet (greater than) Assistive device: None Gait Pattern/deviations: WFL(Within Functional Limits)          Stairs            Wheelchair Mobility    Modified Rankin (Stroke Patients Only) Modified Rankin (Stroke Patients Only) Pre-Morbid Rankin Score: No symptoms Modified Rankin: No symptoms     Balance Overall balance assessment: Independent                                           Pertinent Vitals/Pain Pain Assessment Pain Assessment: No/denies pain    Home Living Family/patient expects to be discharged to:: Private residence Living Arrangements: Children Available Help at Discharge: Family Type of Home: House Home Access: Level entry     Alternate Level Stairs-Number of Steps: flight Home Layout: Two level;Able to live on main level with bedroom/bathroom Home Equipment: None Additional Comments: Professor of Nursing at [employer]    Prior Function Prior Level of Function : Independent/Modified Independent                     Journalist, newspaper        Extremity/Trunk Assessment   Upper Extremity Assessment Upper Extremity Assessment: Overall WFL for tasks assessed    Lower Extremity Assessment Lower Extremity Assessment: Overall WFL for tasks assessed    Cervical / Trunk Assessment Cervical / Trunk Assessment: Normal  Communication  Cognition Arousal/Alertness: Awake/alert Behavior During Therapy: WFL for tasks assessed/performed Overall Cognitive Status: Within Functional Limits for tasks assessed                                          General Comments General comments (skin integrity, edema, etc.): We discussed risk factors fo CVA and her plan is for further workup (TEE, loop recorder) as Outpt    Exercises     Assessment/Plan    PT Assessment Patient does not need any further PT services  PT Problem List         PT  Treatment Interventions      PT Goals (Current goals can be found in the Care Plan section)  Acute Rehab PT Goals Patient Stated Goal: Home soon PT Goal Formulation: All assessment and education complete, DC therapy    Frequency       Co-evaluation               AM-PAC PT "6 Clicks" Mobility  Outcome Measure Help needed turning from your back to your side while in a flat bed without using bedrails?: None Help needed moving from lying on your back to sitting on the side of a flat bed without using bedrails?: None Help needed moving to and from a bed to a chair (including a wheelchair)?: None Help needed standing up from a chair using your arms (e.g., wheelchair or bedside chair)?: None Help needed to walk in hospital room?: None Help needed climbing 3-5 steps with a railing? : None 6 Click Score: 24    End of Session   Activity Tolerance: Patient tolerated treatment well Patient left: Other (comment) (managing independently in room) Nurse Communication: Mobility status (OK for dc home) PT Visit Diagnosis: Other symptoms and signs involving the nervous system (E56.314)    Time: 9702-6378 PT Time Calculation (min) (ACUTE ONLY): 11 min   Charges:   PT Evaluation $PT Eval Low Complexity: [place] [facility] [place] 06/23/2022, 1:50 PM

## 2022-06-23 NOTE — Discharge Summary (Signed)
Physician Discharge Summary  Emily Osborne HRC:[id number] DOB: 1961/02/21 DOA: 06/22/2022  PCP: [name], DO  Admit date: 06/22/2022 Discharge date: 06/23/2022  Admitted From: Home Disposition:  Home  Recommendations for Outpatient Follow-up:  Follow up with PCP in 1-2 weeks Follow up with Neurology in 8 weeks as scheduled TEE and outpatient heart monitor to be set up per Neurology  Home Health:None  Equipment/Devices:None  Discharge Condition:Stable  CODE STATUS:Full  Diet recommendation: Low salt low fat diet    Brief/Interim Summary: Emily Osborne is a 61 y.o. female with medical history significant of diet-controlled type 2 diabetes, multidrug allergies, essential hypertension, hyperlipidemia, hypothyroidism, chronic fatigue syndrome, GERD, morbid obesity, irritable bowel syndrome who was sent over from [facility] for MRI after presentation there with sudden onset of headache late morning followed by numbness on the left side of her face.  No focal weaknesses noted.    Patient admitted as above with acute CVA confirmed on imaging - concern for embolic disease but no clear etiology - outpatient TEE/Heart monitor per neuro. 8 week follow up with neuro as scheduled. Otherwise stable and agreeable for discharge. Start plavix single agen (anaphylaxis to aspirin) and statin. If afib/flutter can be confirmed would recommend anticoagulation given embolic appearance on imaging. Otherwise stable and agreeable for discharge.  Discharge Diagnoses:  Principal Problem:   Acute cerebrovascular accident (CVA) ([place]) Active Problems:   Hyperlipidemia   GERD   Obesity (BMI 30-39.9) with low erv on pfts 09/25/19   Primary hypertension   Diet-controlled diabetes mellitus (HCC)  Acute CVA:  - Diffuse bilateral embolic appearing infarcts on MRI confirming acute CVA - Patient reports history of palpitations but no formal diagnosis of A-fib or flutter, reports wearing Holter  monitor previously without any acute findings.   - Anaphylaxis to aspirin - continue plavix alone. - Neurology following - appreciate insight/recs - outpatient TEE/heart monitor - follow up in 8 weeks per Neuro - Echo unremarkable - CTA negative for occlusion/stenosis - PT/OT eval - no further needs   Essential hypertension:  Resume home medications   Diet-controlled diabetes type 2 - well controlled:  Continues to scale insulin, hypoglycemic protocol    GERD: Continue PPIs. Hyperlipidemia: Continue statin Morbid obesity: Discussed low-fat diet/exercise regimen  Discharge Instructions   Allergies as of 06/23/2022       Reactions   Aspirin Other (See Comments)   REACTION: anaphylaxis   Gentamicin Other (See Comments)   Eye drops turned the sclera bright red   Ibuprofen Other (See Comments)   REACTION: anaphylaxsis   Metronidazole Swelling   REACTION: red face/swelling   Nsaids Other (See Comments)   REACTION: anaphylaxis   Doxycycline Other (See Comments)   REACTION: severe nausea/vomiting   Influenza A (h1n1) Monoval Vac Other (See Comments)   REACTION: sick for 3 weeks   Loratadine Other (See Comments)   Fatigue/weakness   Metformin And Related Other (See Comments)   Memory issues and headache   Periactin [cyproheptadine] Other (See Comments)   paranoid   Ranitidine Hcl Other (See Comments)   REACTION: Lips turned red /peel   Sulfamethoxazole-trimethoprim Other (See Comments)   REACTION: face red/peel   Tramadol Hcl Other (See Comments)   REACTION: paranoid        Medication List     STOP taking these medications    atenolol 25 MG tablet Commonly known as: TENORMIN   furosemide 40 MG tablet Commonly known as: LASIX  TAKE these medications    acetaminophen 500 MG tablet Commonly known as: TYLENOL Take 1,000 mg by mouth every 6 (six) hours as needed for moderate pain.   albuterol 108 (90 Base) MCG/ACT inhaler Commonly known as: ProAir  HFA USE 2 PUFFS EVERY 4 HOURS AS NEEDED FOR COUGH, WHEEZE OR SHORTNESS OF BREATH. What changed:  how much to take how to take this when to take this reasons to take this additional instructions   Ventolin HFA 108 (90 Base) MCG/ACT inhaler Generic drug: albuterol Inhale 2 puffs into the lungs every 6 (six) hours as needed for wheezing or shortness of breath. What changed: Another medication with the same name was changed. Make sure you understand how and when to take each.   albuterol 108 (90 Base) MCG/ACT inhaler Commonly known as: VENTOLIN HFA Inhale 2 puffs into the lungs every 4 (four) hours as needed for wheezing or shortness of breath. What changed: Another medication with the same name was changed. Make sure you understand how and when to take each.   Align 4 MG Caps Take 1 capsule (4 mg total) by mouth daily.   ALPRAZolam 0.25 MG tablet Commonly known as: XANAX TAKE 1 TABLET BY MOUTH THREE TIMES A DAY AS NEEDED What changed: reasons to take this   AMBULATORY NON FORMULARY MEDICATION Medication Name: [facility] [facility]  Apply to affected area as needed   atorvastatin 40 MG tablet Commonly known as: LIPITOR Take 1 tablet (40 mg total) by mouth daily.   clopidogrel 75 MG tablet Commonly known as: PLAVIX Take 1 tablet (75 mg total) by mouth daily. Start taking on: June 24, 2022   dexlansoprazole 60 MG capsule Commonly known as: DEXILANT Take 1 capsule (60 mg total) by mouth daily.   diclofenac sodium 1 % Gel Commonly known as: VOLTAREN Apply 2 g topically 4 (four) times daily. Rub into affected area of foot 2 to 4 times daily What changed:  when to take this reasons to take this   famotidine 20 MG tablet Commonly known as: Pepcid One after supper What changed:  how much to take how to take this when to take this additional instructions   fluticasone 50 MCG/ACT nasal spray Commonly known as:  FLONASE USE 2 SPRAYS INTO BOTH NOSTRILS DAILY. What changed: how much to take   FreeStyle Freedom Kit 1 Device by Does not apply route daily.   FREESTYLE LITE test strip Generic drug: glucose blood USE TO CHECK BLOOD SUGAR ONCE A DAY   levocetirizine 5 MG tablet Commonly known as: XYZAL TAKE 1 TABLET BY MOUTH EVERY EVENING. What changed: how much to take   lidocaine 5 % Commonly known as: LIDODERM Apply to the affected area once daily as needed for a week. **Remove patch after 12 hours**   methimazole 5 MG tablet Commonly known as: TAPAZOLE Take 1 tablet (5 mg total) by mouth 3 (three) times daily. What changed: when to take this   sucralfate 1 g tablet Commonly known as: Carafate Take 1 tablet by mouth every 6 hours as needed. What changed: reasons to take this   Symbicort 80-4.5 MCG/ACT inhaler Generic drug: budesonide-formoterol Inhale 2 puffs into the lungs 2 (two) times daily, in the morning and 12 hours later.   valACYclovir 1000 MG tablet Commonly known as: VALTREX Take 1 tablet (1,000 mg total) by mouth 3 (three) times daily as needed. What changed: reasons to take this  Allergies  Allergen Reactions   Aspirin Other (See Comments)    REACTION: anaphylaxis   Gentamicin Other (See Comments)    Eye drops turned the sclera bright red   Ibuprofen Other (See Comments)    REACTION: anaphylaxsis   Metronidazole Swelling    REACTION: red face/swelling   Nsaids Other (See Comments)    REACTION: anaphylaxis   Doxycycline Other (See Comments)    REACTION: severe nausea/vomiting   Influenza A (H1n1) Monoval Vac Other (See Comments)    REACTION: sick for 3 weeks   Loratadine Other (See Comments)    Fatigue/weakness   Metformin And Related Other (See Comments)    Memory issues and headache   Periactin [Cyproheptadine] Other (See Comments)    paranoid    Ranitidine Hcl Other (See Comments)    REACTION: Lips turned red /peel    Sulfamethoxazole-Trimethoprim Other (See Comments)    REACTION: face red/peel   Tramadol Hcl Other (See Comments)    REACTION: paranoid    Consultations: Neuro   Procedures/Studies: ECHOCARDIOGRAM COMPLETE  Result Date: 06/23/2022    ECHOCARDIOGRAM REPORT   Patient Name:   Emily Osborne Date of Exam: 06/23/2022 Medical Rec #:  [id number]       Height:       65.0 in Accession #:    [id number]      Weight:       231.0 lb Date of Birth:  12/11/1960       BSA:          2.103 m Patient Age:    55 years        BP:           126/87 mmHg Patient Gender: F               HR:           60 bpm. Exam Location:  Inpatient Procedure: 2D Echo, Cardiac Doppler and Color Doppler Indications:    Stroke  History:        Patient has prior history of Echocardiogram examinations, most                 recent 12/30/2018. Stroke, Signs/Symptoms:Fatigue and Shortness                 of Breath; Risk Factors:Hypertension, Diabetes and Dyslipidemia.  Sonographer:    Wenda Low Referring Phys: [id number] ASHISH ARORA IMPRESSIONS  1. Left ventricular ejection fraction, by estimation, is 60 to 65%. The left ventricle has normal function. The left ventricle has no regional wall motion abnormalities. There is mild left ventricular hypertrophy. Left ventricular diastolic parameters were normal.  2. Right ventricular systolic function is normal. The right ventricular size is normal. There is normal pulmonary artery systolic pressure. The estimated right ventricular systolic pressure is 95.0 mmHg.  3. The mitral valve is normal in structure. Trivial mitral valve regurgitation. No evidence of mitral stenosis.  4. The aortic valve is tricuspid. Aortic valve regurgitation is trivial. No aortic stenosis is present.  5. The inferior vena cava is normal in size with greater than 50% respiratory variability, suggesting right atrial pressure of 3 mmHg. FINDINGS  Left Ventricle: Left ventricular ejection fraction, by estimation, is 60 to 65%. The  left ventricle has normal function. The left ventricle has no regional wall motion abnormalities. The left ventricular internal cavity size was normal in size. There is  mild left ventricular hypertrophy. Left ventricular diastolic parameters were normal. Right Ventricle: The right ventricular size  is normal. No increase in right ventricular wall thickness. Right ventricular systolic function is normal. There is normal pulmonary artery systolic pressure. The tricuspid regurgitant velocity is 2.17 m/s, and  with an assumed right atrial pressure of 3 mmHg, the estimated right ventricular systolic pressure is 03.5 mmHg. Left Atrium: Left atrial size was normal in size. Right Atrium: Right atrial size was normal in size. Pericardium: There is no evidence of pericardial effusion. Mitral Valve: The mitral valve is normal in structure. Trivial mitral valve regurgitation. No evidence of mitral valve stenosis. MV peak gradient, 4.4 mmHg. The mean mitral valve gradient is 1.0 mmHg. Tricuspid Valve: The tricuspid valve is normal in structure. Tricuspid valve regurgitation is trivial. Aortic Valve: The aortic valve is tricuspid. Aortic valve regurgitation is trivial. No aortic stenosis is present. Aortic valve mean gradient measures 4.0 mmHg. Aortic valve peak gradient measures 7.0 mmHg. Aortic valve area, by VTI measures 2.89 cm. Pulmonic Valve: The pulmonic valve was not well visualized. Pulmonic valve regurgitation is trivial. Aorta: The aortic root is normal in size and structure. Venous: The inferior vena cava is normal in size with greater than 50% respiratory variability, suggesting right atrial pressure of 3 mmHg. IAS/Shunts: The interatrial septum was not well visualized.  LEFT VENTRICLE PLAX 2D LVIDd:         4.80 cm   Diastology LVIDs:         2.80 cm   LV e' medial:    6.68 cm/s LV PW:         1.00 cm   LV E/e' medial:  10.6 LV IVS:        1.10 cm   LV e' lateral:   9.01 cm/s LVOT diam:     2.00 cm   LV E/e'  lateral: 7.9 LV SV:         89 LV SV Index:   42 LVOT Area:     3.14 cm  RIGHT VENTRICLE RV Basal diam:  3.05 cm RV Mid diam:    2.70 cm RV S prime:     13.80 cm/s TAPSE (M-mode): 2.0 cm LEFT ATRIUM             Index        RIGHT ATRIUM           Index LA diam:        4.20 cm 2.00 cm/m   RA Area:     14.40 cm LA Vol (A2C):   67.5 ml 32.09 ml/m  RA Volume:   32.40 ml  15.40 ml/m LA Vol (A4C):   36.7 ml 17.45 ml/m LA Biplane Vol: 50.1 ml 23.82 ml/m  AORTIC VALVE                    PULMONIC VALVE AV Area (Vmax):    2.74 cm     PV Vmax:       0.98 m/s AV Area (Vmean):   2.72 cm     PV Peak grad:  3.8 mmHg AV Area (VTI):     2.89 cm AV Vmax:           132.00 cm/s AV Vmean:          90.000 cm/s AV VTI:            0.307 m AV Peak Grad:      7.0 mmHg AV Mean Grad:      4.0 mmHg LVOT Vmax:  115.00 cm/s LVOT Vmean:        77.900 cm/s LVOT VTI:          0.282 m LVOT/AV VTI ratio: 0.92  AORTA Ao Root diam: 3.20 cm MITRAL VALVE               TRICUSPID VALVE MV Area (PHT): 3.42 cm    TR Peak grad:   18.8 mmHg MV Area VTI:   2.68 cm    TR Vmax:        217.00 cm/s MV Peak grad:  4.4 mmHg MV Mean grad:  1.0 mmHg    SHUNTS MV Vmax:       1.05 m/s    Systemic VTI:  0.28 m MV Vmean:      54.4 cm/s   Systemic Diam: 2.00 cm MV Decel Time: 222 msec MV E velocity: 71.00 cm/s MV A velocity: 76.80 cm/s MV E/A ratio:  0.92 Oswaldo Milian MD Electronically signed by Oswaldo Milian MD Signature Date/Time: 06/23/2022/2:05:05 PM    Final    CT ANGIO HEAD NECK W WO CM  Result Date: 06/22/2022 CLINICAL DATA:  Follow-up examination for stroke. EXAM: CT ANGIOGRAPHY HEAD AND NECK TECHNIQUE: Multidetector CT imaging of the head and neck was performed using the standard protocol during bolus administration of intravenous contrast. Multiplanar CT image reconstructions and MIPs were obtained to evaluate the vascular anatomy. Carotid stenosis measurements (when applicable) are obtained utilizing NASCET criteria, using the  distal internal carotid diameter as the denominator. RADIATION DOSE REDUCTION: This exam was performed according to the departmental dose-optimization program which includes automated exposure control, adjustment of the mA and/or kV according to patient size and/or use of iterative reconstruction technique. CONTRAST:  85m OMNIPAQUE IOHEXOL 350 MG/ML SOLN COMPARISON:  Prior CT and MRI from earlier the same day. FINDINGS: CT HEAD FINDINGS Brain: Previous identified punctate infarcts not visible by CT. No acute intracranial hemorrhage. No other acute large vessel territory infarct. No mass lesion or midline shift. No hydrocephalus or extra-axial fluid collection. Vascular: No hyperdense vessel. Scattered vascular calcifications noted within the carotid siphons. Skull: Scalp soft tissues and calvarium demonstrate no acute finding. Sinuses/Orbits: Globes orbital soft tissues within normal limits. Paranasal sinuses and mastoid air cells are clear. Other: None. Review of the MIP images confirms the above findings CTA NECK FINDINGS Aortic arch: Visualized aortic arch normal caliber with standard 3 vessel morphology. No stenosis about the origin the great vessels. Right carotid system: Right common and internal carotid arteries are tortuous but widely patent without stenosis or dissection. Left carotid system: Left common and internal carotid arteries are tortuous but widely patent without stenosis or dissection. Vertebral arteries: Both vertebral arteries arise from the subclavian arteries. No proximal subclavian artery stenosis. Vertebral arteries are tortuous but widely patent without stenosis or dissection. Skeleton: No discrete or worrisome osseous lesions. Other neck: No other acute soft tissue abnormality within the neck. Upper chest: Visualized upper chest demonstrates no acute finding. Review of the MIP images confirms the above findings CTA HEAD FINDINGS Anterior circulation: Both internal carotid arteries patent  to the termini without stenosis. A1 segments, anterior communicating complex common anterior cerebral arteries widely patent. No M1 stenosis or occlusion. No proximal MCA branch occlusion or high-grade stenosis. Distal MCA branches perfused and symmetric. Posterior circulation: Both V4 segments widely patent. Right PICA patent. Left PICA not well seen. Basilar widely patent without stenosis. Superior cerebral arteries patent bilaterally. Left PCA supplied via the basilar. Fetal type origin right PCA. Both  PCAs widely patent to their distal aspects without stenosis. Venous sinuses: Patent allowing for timing the contrast bolus. Anatomic variants: As above.  No aneurysm. Review of the MIP images confirms the above findings IMPRESSION: 1. Negative CTA of the head and neck. No large vessel occlusion or other emergent finding. No hemodynamically significant or correctable stenosis. 2. Diffuse tortuosity of the major arterial vasculature of the head and neck, suggesting chronic underlying hypertension. Electronically Signed   By: Jeannine Boga M.D.   On: 06/22/2022 23:39   MR BRAIN WO CONTRAST  Result Date: 06/22/2022 CLINICAL DATA:  Initial evaluation for acute stroke. EXAM: MRI HEAD WITHOUT CONTRAST TECHNIQUE: Multiplanar, multiecho pulse sequences of the brain and surrounding structures were obtained without intravenous contrast. COMPARISON:  Comparison made with prior CT from earlier same day as well as previous MRI from 06/03/2020. FINDINGS: Brain: Cerebral volume stable, and remains within normal limits for age. Scattered patchy T2/FLAIR hyperintensity involving the periventricular deep white matter both cerebral hemispheres, most likely related chronic microvascular ischemic disease, mild in nature. Few punctate foci of restricted diffusion seen involving the cortical gray matter of both cerebral hemispheres, with involvement of the bilateral parietal lobes (series 2, image 40) and right frontal lobe  (series 2, image 32). Findings consistent with small acute ischemic infarcts, presumably embolic. No associated hemorrhage or mass effect. No other evidence for acute or subacute ischemia. Gray-white matter differentiation otherwise maintained. No acute or chronic intracranial blood products. No mass lesion, midline shift or mass effect. No hydrocephalus or extra-axial fluid collection. Pituitary gland and suprasellar region within normal limits. Vascular: Major intracranial vascular flow voids are well maintained. Skull and upper cervical spine: Craniocervical junction within normal limits. Bone marrow signal intensity normal. No scalp soft tissue abnormality. Sinuses/Orbits: Globes orbital soft tissues demonstrate no acute finding. Mild scattered mucosal thickening noted about the ethmoidal air cells and maxillary sinuses. No significant mastoid effusion. Other: None. IMPRESSION: 1. Few punctate acute ischemic infarcts involving the cortical gray matter of both cerebral hemispheres, presumably embolic. No associated hemorrhage or mass effect. 2. Underlying mild chronic microvascular ischemic disease, stable. Electronically Signed   By: Jeannine Boga M.D.   On: 06/22/2022 21:10   CT Head Wo Contrast  Result Date: 06/22/2022 CLINICAL DATA:  Headache started at 11:45. Left-sided facial numbness and tingling. EXAM: CT HEAD WITHOUT CONTRAST TECHNIQUE: Contiguous axial images were obtained from the base of the skull through the vertex without intravenous contrast. RADIATION DOSE REDUCTION: This exam was performed according to the departmental dose-optimization program which includes automated exposure control, adjustment of the mA and/or kV according to patient size and/or use of iterative reconstruction technique. COMPARISON:  06/03/2020 FINDINGS: Brain: No evidence of acute infarction, hemorrhage, hydrocephalus, extra-axial collection or mass lesion/mass effect. Vascular: No hyperdense vessel or unexpected  calcification. Skull: No osseous abnormality. Sinuses/Orbits: Visualized paranasal sinuses are clear. Visualized mastoid sinuses are clear. Visualized orbits demonstrate no focal abnormality. Other: None IMPRESSION: 1. No acute intracranial findings. Electronically Signed   By: Kathreen Devoid M.D.   On: 06/22/2022 14:26     Subjective: No acute issue/events overnight   Discharge Exam: Vitals:   06/23/22 1430 06/23/22 1500  BP: 135/81 (!) 151/107  Pulse: 68 76  Resp: 17 18  Temp:  98 F (36.7 C)  SpO2: 97% 100%   Vitals:   06/23/22 1118 06/23/22 1315 06/23/22 1430 06/23/22 1500  BP:  122/78 135/81 (!) 151/107  Pulse:  65 68 76  Resp:  (!) 22 17  18  Temp: 97.9 F (36.6 C)   98 F (36.7 C)  TempSrc: Oral   Oral  SpO2:  96% 97% 100%  Weight:      Height:        General: Pt is alert, awake, not in acute distress Cardiovascular: RRR, S1/S2 +, no rubs, no gallops Respiratory: CTA bilaterally, no wheezing, no rhonchi Abdominal: Soft, NT, ND, bowel sounds + Extremities: no edema, no cyanosis    The results of significant diagnostics from this hospitalization (including imaging, microbiology, ancillary and laboratory) are listed below for reference.     Microbiology: No results found for this or any previous visit (from the past 240 hour(s)).   Labs: BNP (last 3 results) No results for input(s): "BNP" in the last 8760 hours. Basic Metabolic Panel: Recent Labs  Lab 06/22/22 1329  NA 140  K 4.1  CL 106  CO2 29  GLUCOSE 89  BUN 11  CREATININE 0.81  CALCIUM 9.2   Liver Function Tests: Recent Labs  Lab 06/22/22 1329  AST 22  ALT 21  ALKPHOS 82  BILITOT 0.6  PROT 7.5  ALBUMIN 4.0   No results for input(s): "LIPASE", "AMYLASE" in the last 168 hours. No results for input(s): "AMMONIA" in the last 168 hours. CBC: Recent Labs  Lab 06/22/22 1315  WBC 6.3  NEUTROABS 3.0  HGB 14.4  HCT 43.4  MCV 84.3  PLT 200   Cardiac Enzymes: No results for input(s):  "CKTOTAL", "CKMB", "CKMBINDEX", "TROPONINI" in the last 168 hours. BNP: Invalid input(s): "POCBNP" CBG: Recent Labs  Lab 06/22/22 1313 06/22/22 2348 06/23/22 0753 06/23/22 1201  GLUCAP 95 88 104* 73   D-Dimer No results for input(s): "DDIMER" in the last 72 hours. Hgb A1c Recent Labs    06/23/22 0504  HGBA1C 5.6   Lipid Profile Recent Labs    06/23/22 0504  CHOL 163  HDL 50  LDLCALC 95  TRIG 89  CHOLHDL 3.3   Thyroid function studies No results for input(s): "TSH", "T4TOTAL", "T3FREE", "THYROIDAB" in the last 72 hours.  Invalid input(s): "FREET3" Anemia work up No results for input(s): "VITAMINB12", "FOLATE", "FERRITIN", "TIBC", "IRON", "RETICCTPCT" in the last 72 hours. Urinalysis    Component Value Date/Time   COLORURINE YELLOW 06/22/2022 1348   APPEARANCEUR CLEAR 06/22/2022 1348   LABSPEC <=1.005 06/22/2022 1348   PHURINE 6.0 06/22/2022 1348   GLUCOSEU NEGATIVE 06/22/2022 1348   HGBUR NEGATIVE 06/22/2022 1348   HGBUR negative 09/07/2010 0838   BILIRUBINUR NEGATIVE 06/22/2022 1348   BILIRUBINUR Negative 02/02/2021 1345   KETONESUR NEGATIVE 06/22/2022 1348   PROTEINUR NEGATIVE 06/22/2022 1348   UROBILINOGEN 0.2 02/02/2021 1345   UROBILINOGEN negative 09/07/2010 0838   NITRITE NEGATIVE 06/22/2022 1348   LEUKOCYTESUR NEGATIVE 06/22/2022 1348   Sepsis Labs Recent Labs  Lab 06/22/22 1315  WBC 6.3   Microbiology No results found for this or any previous visit (from the past 240 hour(s)).   Time coordinating discharge: Over 30 minutes  SIGNED:   Little Ishikawa, DO [facility] 06/23/2022, 4:48 PM Pager   If 7PM-7AM, please contact night-coverage [email or web address]

## 2022-06-23 NOTE — Consult Note (Addendum)
Neurology Consultation  Reason for Consult: Stroke Referring Physician: Dr Johnney Killian  CC: Left-sided numbness  History is obtained from: Patient, chart  HPI: Emily Osborne is a 61 y.o. female past medical history of palpitations, fibromyalgia, chronic headaches, hypertension, hyperlipidemia, type 2 diabetes, B12 deficiency, Graves' disease, presented to the emergency room for evaluation of headache lightheadedness as well as numbness and tingling on the left face, left shoulder and then left arm and leg which has since resolved. She reported her last known well to be sometime around this morning before 11:45 AM when she had a dull headache that started.  She took some Tylenol but soon after started noting some numbness on the left face as well as left shoulder.  Her symptoms did not improve and then she also had some numbness in the left arm and left leg.  She was noted to be somewhat hypertensive.  Given her focal symptoms sudden onset, it was recommended that she get an MRI which was completed and it showed acute ischemic strokes as described below. Neurology was consulted for strokes   LKW: Somewhat before 11:45 AM on 06/22/2022 IV thrombolysis given?: no, symptoms resolved-decision made at the outside ER Premorbid modified Rankin scale (mRS): 0   ROS: Full ROS was performed and is negative except as noted in the HPI.  Past Medical History:  Diagnosis Date   Allergy    Anxiety    Asthma    pt has inhaler   Back pain    C. difficile colitis    Cataract    bil eyes   Chest pain    Chronic fatigue syndrome    Clostridium difficile colitis 12/13/2016   Diverticulitis    Dyspnea    Dysrhythmia    palpitations   Edema    Elevated blood pressure reading without diagnosis of hypertension    "just elevated when I'm in pain" (12/07/2015)   Fatty liver    Fibromyalgia    Ganglion of joint    right wrist   GERD (gastroesophageal reflux disease)    H. pylori infection    Headache     hx of   Helicobacter pylori (H. pylori) infection 12/13/2016   Herpes zoster without mention of complication    HTN (hypertension)    Hyperlipidemia    Hyperthyroidism    IBS (irritable bowel syndrome)    Leg edema    Multiple drug allergies 06/18/2017   Myalgia    Nontraumatic rupture of Achilles tendon    Other acute reactions to stress    Pain in joint, shoulder region    Pain in joint, upper arm    Pain in limb    Palpitations    Personal history of other diseases of digestive system    Pneumonia    once   PONV (postoperative nausea and vomiting)    Routine screening for STI (sexually transmitted infection) 12/13/2016   S/P partial colectomy 06/18/2017   Thyroid disease    hyperthyroidism   Type 2 diabetes mellitus with hyperglycemia, without long-term current use of insulin ([place]) 08/07/2018   Vitamin B 12 deficiency      Family History  Problem Relation Age of Onset   Hypertension Father    Diabetes Father    Heart disease Father    Kidney disease Father        Died, 14   Hyperlipidemia Father    Leukemia Brother    Cancer Brother        myleoblastic anemia  Hyperlipidemia Mother    Hypertension Mother        Died, 10   Thyroid disease Mother        Thyroid surgery   Obesity Mother    Heart disease Mother    Pernicious anemia Sister    Thyroid disease Sister        On thyroid Rx   Lupus Daughter    Coronary artery disease Brother    Hypertension Brother    Colon cancer Neg Hx    Esophageal cancer Neg Hx    Rectal cancer Neg Hx    Stomach cancer Neg Hx    Pancreatic cancer Neg Hx    Prostate cancer Neg Hx    Colon polyps Neg Hx      Social History:   reports that she has never smoked. She has never used smokeless tobacco. She reports that she does not drink alcohol and does not use drugs.  Medications  Current Facility-Administered Medications:    insulin aspart (novoLOG) injection 0-15 Units, 0-15 Units, Subcutaneous, TID WC, Garba, Mohammad  L, MD   insulin aspart (novoLOG) injection 0-5 Units, 0-5 Units, Subcutaneous, QHS, Garba, Mohammad L, MD  Current Outpatient Medications:    acetaminophen (TYLENOL) 500 MG tablet, Take 1,000 mg by mouth every 6 (six) hours as needed for moderate pain., Disp: , Rfl:    albuterol (PROAIR HFA) 108 (90 Base) MCG/ACT inhaler, USE 2 PUFFS EVERY 4 HOURS AS NEEDED FOR COUGH, WHEEZE OR SHORTNESS OF BREATH., Disp: 6.7 g, Rfl: 0   albuterol (PROVENTIL HFA) 108 (90 Base) MCG/ACT inhaler, Inhale 2 puffs into the lungs every 6 (six) hours as needed for wheezing or shortness of breath., Disp: 18 g, Rfl: 2   albuterol (VENTOLIN HFA) 108 (90 Base) MCG/ACT inhaler, Inhale 2 puffs into the lungs every 4 (four) hours as needed for wheezing or shortness of breath., Disp: 18 g, Rfl: 2   ALPRAZolam (XANAX) 0.25 MG tablet, TAKE 1 TABLET BY MOUTH THREE TIMES A DAY AS NEEDED, Disp: 30 tablet, Rfl: 2   AMBULATORY NON FORMULARY MEDICATION, Medication Name: [facility]  Apply to affected area as needed, Disp: 30 g, Rfl: 11   atenolol (TENORMIN) 25 MG tablet, Take 1/2 tablet (12.5 mg total) by mouth daily. (Patient not taking: Reported on 06/15/2022), Disp: 45 tablet, Rfl: 0   azelastine (OPTIVAR) 0.05 % ophthalmic solution, Place 1 drop into both eyes 2 (two) times daily as needed. (Patient not taking: Reported on 06/15/2022), Disp: , Rfl:    Blood Glucose Monitoring Suppl (FREESTYLE FREEDOM) KIT, 1 Device by Does not apply route daily., Disp: 1 each, Rfl: 0   budesonide-formoterol (SYMBICORT) 80-4.5 MCG/ACT inhaler, Inhale 2 puffs into the lungs 2 (two) times daily, in the morning and 12 hours later., Disp: 10.2 g, Rfl: 0   dexlansoprazole (DEXILANT) 60 MG capsule, Take 1 capsule (60 mg total) by mouth daily., Disp: 90 capsule, Rfl: 2   diclofenac sodium (VOLTAREN) 1 % GEL, Apply 2 g topically 4 (four) times daily. Rub into affected area of foot 2 to 4 times daily,  Disp: 100 g, Rfl: 2   famotidine (PEPCID) 20 MG tablet, One after supper, Disp: 30 tablet, Rfl: 11   fluticasone (FLONASE) 50 MCG/ACT nasal spray, USE 2 SPRAYS INTO BOTH NOSTRILS DAILY., Disp: 16 g, Rfl: 5   furosemide (LASIX) 40 MG tablet, Take 1 tablet (40 mg total) by mouth daily., Disp: 30 tablet, Rfl: 3   glucose  blood (FREESTYLE LITE) test strip, USE TO CHECK BLOOD SUGAR ONCE A DAY, Disp: 100 each, Rfl: 1   Iron Combinations (I.L.X. B-12) ELIX, Take 1 each by mouth daily as needed (energy). 1 droplet full, Disp: , Rfl:    levocetirizine (XYZAL) 5 MG tablet, TAKE 1 TABLET BY MOUTH EVERY EVENING., Disp: 90 tablet, Rfl: 3   lidocaine (LIDODERM) 5 %, Apply to the affected area once daily as needed for a week. **Remove patch after 12 hours**, Disp: 30 patch, Rfl: 0   methimazole (TAPAZOLE) 5 MG tablet, Take 1 tablet (5 mg total) by mouth 3 (three) times daily. (Patient taking differently: Take 5 mg by mouth once.), Disp: 90 tablet, Rfl: 1   Probiotic Product (ALIGN) 4 MG CAPS, Take 1 capsule (4 mg total) by mouth daily., Disp: 90 capsule, Rfl: 3   sucralfate (CARAFATE) 1 g tablet, Take 1 tablet by mouth every 6 hours as needed., Disp: 60 tablet, Rfl: 3   valACYclovir (VALTREX) 1000 MG tablet, Take 1 tablet (1,000 mg total) by mouth 3 (three) times daily as needed., Disp: 30 tablet, Rfl: 2   Exam: Current vital signs: BP (!) 140/88   Pulse 64   Temp 98 F (36.7 C) (Oral)   Resp 16   Ht _0  (1.651 m)   Wt 104.8 kg   SpO2 99%   BMI 38.45 kg/m  Vital signs in last 24 hours: Temp:  [97.7 F (36.5 C)-98 F (36.7 C)] 98 F (36.7 C) (11/03 2113) Pulse Rate:  [57-69] 64 (11/03 2113) Resp:  [12-18] 16 (11/03 2113) BP: (125-174)/(77-111) 140/88 (11/03 2113) SpO2:  [98 %-100 %] 99 % (11/03 2113) Weight:  [104.8 kg] 104.8 kg (11/03 1245)  General: Awake alert in no distress HEENT: Normocephalic atraumatic Lungs: Clear Cardiovascular: Regular rate rhythm Abdomen nondistended  nontender Neurological exam Awake alert oriented x3 No dysarthria No aphasia Cranial nerves II to XII intact Motor examination with no drift but she has pain that limits her strength examination of the left shoulder and arm-that is chronic. Sensory examination-intact sensation bilaterally without extinction Coordination exam with no dysmetria NIH stroke scale at this time 0  Labs I have reviewed labs in epic and the results pertinent to this consultation are:  CBC    Component Value Date/Time   WBC 6.3 06/22/2022 1315   RBC 5.15 (H) 06/22/2022 1315   HGB 14.4 06/22/2022 1315   HGB 13.5 12/12/2017 1102   HCT 43.4 06/22/2022 1315   HCT 41.8 12/12/2017 1102   PLT 200 06/22/2022 1315   MCV 84.3 06/22/2022 1315   MCV 89 12/12/2017 1102   MCH 28.0 06/22/2022 1315   MCHC 33.2 06/22/2022 1315   RDW 15.9 (H) 06/22/2022 1315   RDW 15.1 12/12/2017 1102   LYMPHSABS 2.8 06/22/2022 1315   LYMPHSABS 2.3 12/12/2017 1102   MONOABS 0.5 06/22/2022 1315   EOSABS 0.0 06/22/2022 1315   EOSABS 0.1 12/12/2017 1102   BASOSABS 0.0 06/22/2022 1315   BASOSABS 0.0 12/12/2017 1102    CMP     Component Value Date/Time   NA 140 06/22/2022 1329   NA 142 03/20/2018 1046   K 4.1 06/22/2022 1329   CL 106 06/22/2022 1329   CO2 29 06/22/2022 1329   GLUCOSE 89 06/22/2022 1329   BUN 11 06/22/2022 1329   BUN 17 03/20/2018 1046   CREATININE 0.81 06/22/2022 1329   CREATININE 0.96 05/23/2020 1111   CALCIUM 9.2 06/22/2022 1329   PROT 7.5 06/22/2022 1329  PROT 6.8 03/20/2018 1046   ALBUMIN 4.0 06/22/2022 1329   ALBUMIN 4.4 03/20/2018 1046   AST 22 06/22/2022 1329   ALT 21 06/22/2022 1329   ALKPHOS 82 06/22/2022 1329   BILITOT 0.6 06/22/2022 1329   BILITOT 0.4 03/20/2018 1046   GFRNONAA >60 06/22/2022 1329   GFRAA >60 11/27/2018 1144    Lipid Panel     Component Value Date/Time   CHOL 199 12/11/2021 1033   CHOL 197 03/20/2018 1046   TRIG 180.0 (H) 12/11/2021 1033   HDL 49.40 12/11/2021 1033    HDL 57 03/20/2018 1046   CHOLHDL 4 12/11/2021 1033   VLDL 36.0 12/11/2021 1033   LDLCALC 113 (H) 12/11/2021 1033   LDLCALC 124 (H) 03/20/2018 1046   LDLDIRECT 139.4 12/26/2011 0905     Imaging I have reviewed the images obtained:  MRI examination of the brain-few punctate acute ischemic infarcts involving cortical gray matter in both cerebral hemispheres-suspect underlying embolic etiology.  Negative CTA head and neck no large vessel occlusion.  Diffuse tortuosity in the major arterial vasculature of the head and neck suggesting chronic underlying hypertension.   Assessment: 62 year old with past medical history of cerebrovascular risk factors of hypertension hyperlipidemia as well as Graves' disease presenting for evaluation of sudden onset of headache followed by left-sided numbness which has since resolved but MR imaging of the brain shows punctate embolic looking infarcts in bilateral cerebral hemisphere. She has a history of palpitations without any clear arrhythmias documented. Strong suspicion for cardioembolic etiology given Graves' disease/hyperthyroidism and palpitations now with strokes that involve multiple vascular territories.  Impression Acute ischemic stroke-cardioembolic etiology suspected  Recommendations: Admit to hospitalist Frequent neurochecks Telemetry Ideally should be on dual antiplatelet but has anaphylaxis listed to aspirin.  Will start on Plavix 75. 2D echo A1c Lipid panel-goal LDL less than 70.  Use high intensity statin if LDL greater than 70. PT OT Speech therapy Permissive hypertension-allow for pressures to be as high as 220 mmHg and treat only if higher than that on a as needed basis for the next day or 2 and then start normalizing blood pressures to normal goal on discharge. Stroke team will follow with you Plan discussed with Dr. Vallery Ridge.  -- Amie Portland, MD Neurologist [facility] Pager: [phone]

## 2022-06-23 NOTE — Progress Notes (Signed)
*  PRELIMINARY RESULTS* Echocardiogram 2D Echocardiogram has been performed.  Emily Osborne 06/23/2022, 10:41 AM

## 2022-06-23 NOTE — Progress Notes (Addendum)
STROKE TEAM PROGRESS NOTE   INTERVAL HISTORY Her daughter is at the bedside.  She is laying on the stretcher in NAD.  MRI brain with scattered punctate ischemic infarcts bilaterally. She had developed numbness and weakness of the left side of her body while at work. Symptoms have resolved.  She is allergic to aspirin hence will use Plavix along.  She denies history of atrial fibrillation or cardiac arrhythmias. Vitals:   06/23/22 0224 06/23/22 0437 06/23/22 0715 06/23/22 0830  BP: 129/85 113/70 126/87   Pulse: 81 73 75   Resp: '16 14 15   '$ Temp:  98 F (36.7 C)  98.5 F (36.9 C)  TempSrc:  Oral  Oral  SpO2: 100% 97% 96%   Weight:      Height:       CBC:  Recent Labs  Lab 06/22/22 1315  WBC 6.3  NEUTROABS 3.0  HGB 14.4  HCT 43.4  MCV 84.3  PLT 834   Basic Metabolic Panel:  Recent Labs  Lab 06/22/22 1329  NA 140  K 4.1  CL 106  CO2 29  GLUCOSE 89  BUN 11  CREATININE 0.81  CALCIUM 9.2   Lipid Panel:  Recent Labs  Lab 06/23/22 0504  CHOL 163  TRIG 89  HDL 50  CHOLHDL 3.3  VLDL 18  LDLCALC 95   HgbA1c:  Recent Labs  Lab 06/23/22 0504  HGBA1C 5.6   Urine Drug Screen:  Recent Labs  Lab 06/22/22 1348  LABOPIA NONE DETECTED  COCAINSCRNUR NONE DETECTED  LABBENZ NONE DETECTED  AMPHETMU NONE DETECTED  THCU NONE DETECTED  LABBARB NONE DETECTED    Alcohol Level  Recent Labs  Lab 06/22/22 1316  ETH <10    IMAGING past 24 hours CT ANGIO HEAD NECK W WO CM  Result Date: 06/22/2022 CLINICAL DATA:  Follow-up examination for stroke. EXAM: CT ANGIOGRAPHY HEAD AND NECK TECHNIQUE: Multidetector CT imaging of the head and neck was performed using the standard protocol during bolus administration of intravenous contrast. Multiplanar CT image reconstructions and MIPs were obtained to evaluate the vascular anatomy. Carotid stenosis measurements (when applicable) are obtained utilizing NASCET criteria, using the distal internal carotid diameter as the denominator.  RADIATION DOSE REDUCTION: This exam was performed according to the departmental dose-optimization program which includes automated exposure control, adjustment of the mA and/or kV according to patient size and/or use of iterative reconstruction technique. CONTRAST:  63m OMNIPAQUE IOHEXOL 350 MG/ML SOLN COMPARISON:  Prior CT and MRI from earlier the same day. FINDINGS: CT HEAD FINDINGS Brain: Previous identified punctate infarcts not visible by CT. No acute intracranial hemorrhage. No other acute large vessel territory infarct. No mass lesion or midline shift. No hydrocephalus or extra-axial fluid collection. Vascular: No hyperdense vessel. Scattered vascular calcifications noted within the carotid siphons. Skull: Scalp soft tissues and calvarium demonstrate no acute finding. Sinuses/Orbits: Globes orbital soft tissues within normal limits. Paranasal sinuses and mastoid air cells are clear. Other: None. Review of the MIP images confirms the above findings CTA NECK FINDINGS Aortic arch: Visualized aortic arch normal caliber with standard 3 vessel morphology. No stenosis about the origin the great vessels. Right carotid system: Right common and internal carotid arteries are tortuous but widely patent without stenosis or dissection. Left carotid system: Left common and internal carotid arteries are tortuous but widely patent without stenosis or dissection. Vertebral arteries: Both vertebral arteries arise from the subclavian arteries. No proximal subclavian artery stenosis. Vertebral arteries are tortuous but widely patent without stenosis  or dissection. Skeleton: No discrete or worrisome osseous lesions. Other neck: No other acute soft tissue abnormality within the neck. Upper chest: Visualized upper chest demonstrates no acute finding. Review of the MIP images confirms the above findings CTA HEAD FINDINGS Anterior circulation: Both internal carotid arteries patent to the termini without stenosis. A1 segments,  anterior communicating complex common anterior cerebral arteries widely patent. No M1 stenosis or occlusion. No proximal MCA branch occlusion or high-grade stenosis. Distal MCA branches perfused and symmetric. Posterior circulation: Both V4 segments widely patent. Right PICA patent. Left PICA not well seen. Basilar widely patent without stenosis. Superior cerebral arteries patent bilaterally. Left PCA supplied via the basilar. Fetal type origin right PCA. Both PCAs widely patent to their distal aspects without stenosis. Venous sinuses: Patent allowing for timing the contrast bolus. Anatomic variants: As above.  No aneurysm. Review of the MIP images confirms the above findings IMPRESSION: 1. Negative CTA of the head and neck. No large vessel occlusion or other emergent finding. No hemodynamically significant or correctable stenosis. 2. Diffuse tortuosity of the major arterial vasculature of the head and neck, suggesting chronic underlying hypertension. Electronically Signed   By: Jeannine Boga M.D.   On: 06/22/2022 23:39   MR BRAIN WO CONTRAST  Result Date: 06/22/2022 CLINICAL DATA:  Initial evaluation for acute stroke. EXAM: MRI HEAD WITHOUT CONTRAST TECHNIQUE: Multiplanar, multiecho pulse sequences of the brain and surrounding structures were obtained without intravenous contrast. COMPARISON:  Comparison made with prior CT from earlier same day as well as previous MRI from 06/03/2020. FINDINGS: Brain: Cerebral volume stable, and remains within normal limits for age. Scattered patchy T2/FLAIR hyperintensity involving the periventricular deep white matter both cerebral hemispheres, most likely related chronic microvascular ischemic disease, mild in nature. Few punctate foci of restricted diffusion seen involving the cortical gray matter of both cerebral hemispheres, with involvement of the bilateral parietal lobes (series 2, image 40) and right frontal lobe (series 2, image 32). Findings consistent with  small acute ischemic infarcts, presumably embolic. No associated hemorrhage or mass effect. No other evidence for acute or subacute ischemia. Gray-white matter differentiation otherwise maintained. No acute or chronic intracranial blood products. No mass lesion, midline shift or mass effect. No hydrocephalus or extra-axial fluid collection. Pituitary gland and suprasellar region within normal limits. Vascular: Major intracranial vascular flow voids are well maintained. Skull and upper cervical spine: Craniocervical junction within normal limits. Bone marrow signal intensity normal. No scalp soft tissue abnormality. Sinuses/Orbits: Globes orbital soft tissues demonstrate no acute finding. Mild scattered mucosal thickening noted about the ethmoidal air cells and maxillary sinuses. No significant mastoid effusion. Other: None. IMPRESSION: 1. Few punctate acute ischemic infarcts involving the cortical gray matter of both cerebral hemispheres, presumably embolic. No associated hemorrhage or mass effect. 2. Underlying mild chronic microvascular ischemic disease, stable. Electronically Signed   By: Jeannine Boga M.D.   On: 06/22/2022 21:10   CT Head Wo Contrast  Result Date: 06/22/2022 CLINICAL DATA:  Headache started at 11:45. Left-sided facial numbness and tingling. EXAM: CT HEAD WITHOUT CONTRAST TECHNIQUE: Contiguous axial images were obtained from the base of the skull through the vertex without intravenous contrast. RADIATION DOSE REDUCTION: This exam was performed according to the departmental dose-optimization program which includes automated exposure control, adjustment of the mA and/or kV according to patient size and/or use of iterative reconstruction technique. COMPARISON:  06/03/2020 FINDINGS: Brain: No evidence of acute infarction, hemorrhage, hydrocephalus, extra-axial collection or mass lesion/mass effect. Vascular: No hyperdense vessel or unexpected  calcification. Skull: No osseous abnormality.  Sinuses/Orbits: Visualized paranasal sinuses are clear. Visualized mastoid sinuses are clear. Visualized orbits demonstrate no focal abnormality. Other: None IMPRESSION: 1. No acute intracranial findings. Electronically Signed   By: Kathreen Devoid M.D.   On: 06/22/2022 14:26    PHYSICAL EXAM  Temp:  [97.7 F (36.5 C)-98.5 F (36.9 C)] 98 F (36.7 C) (11/04 1500) Pulse Rate:  [59-81] 76 (11/04 1500) Resp:  [12-22] 18 (11/04 1500) BP: (113-159)/(70-107) 151/107 (11/04 1500) SpO2:  [96 %-100 %] 100 % (11/04 1500)  General -mildly obese middle-aged Caucasian lady, in no apparent distress. Cardiovascular - Regular rhythm and rate.  Mental Status -  Level of arousal and orientation to time, place, and person were intact. Language including expression, naming, repetition, comprehension was assessed and found intact. Attention span and concentration were normal. Recent and remote memory were intact. Fund of Knowledge was assessed and was intact.  Cranial Nerves II - XII - II - Visual field intact OU. III, IV, VI - Extraocular movements intact. V - Facial sensation intact bilaterally. VII - Facial movement intact bilaterally. VIII - Hearing & vestibular intact bilaterally. X - Palate elevates symmetrically. XI - Chin turning & shoulder shrug intact bilaterally. XII - Tongue protrusion intact.  Motor Strength - The patient's strength was normal in all extremities and pronator drift was absent.  Bulk was normal and fasciculations were absent.   Motor Tone - Muscle tone was assessed at the neck and appendages and was normal.  Sensory - Light touch, temperature/pinprick were assessed and were symmetrical.    Coordination - The patient had normal movements in the hands and feet with no ataxia or dysmetria.  Tremor was absent.  Gait and Station - deferred.  ASSESSMENT/PLAN Emily Osborne is a 61 y.o. female with history of  palpitations, fibromyalgia, chronic headaches, hypertension,  hyperlipidemia, type 2 diabetes, B12 deficiency, Graves' disease, presented to the emergency room for evaluation of headache lightheadedness as well as numbness and tingling on the left face, left shoulder and then left arm and leg which has since resolved. She reported her last known well to be sometime around this morning before 11:45 AM when she had a dull headache that started.  She took some Tylenol but soon after started noting some numbness on the left face as well as left shoulder.  Her symptoms did not improve and then she also had some numbness in the left arm and left leg.  She was noted to be somewhat hypertensive.    Stroke Acute bilateral tiny punctate ischemic infarcts Etiology:  likely cardio embolic  CT head No acute intracranial findings  CTA head & neck  1. Negative CTA of the head and neck. No large vessel occlusion or other emergent finding. No hemodynamically significant or correctable stenosis. 2. Diffuse tortuosity of the major arterial vasculature of the head and neck, suggesting chronic underlying hypertension MRI   1. Few punctate acute ischemic infarcts involving the cortical gray matter of both cerebral hemispheres, presumably embolic. No associated hemorrhage or mass effect. 2. Underlying mild chronic microvascular ischemic disease, stable. 2D Echo EF 60-65%. mild left ventricular hypertrophy   LDL 95 HgbA1c 5.6 VTE prophylaxis - Lovenox    Diet   Diet NPO time specified   No antithrombotic prior to admission, now on clopidogrel 75 mg daily. ( Allergy to ASA) Therapy recommendations:  pending Disposition:  pending  Hypertension Home meds:  none Stable Permissive hypertension (OK if < 220/120) but gradually  normalize in 5-7 days Long-term BP goal normotensive  Hyperlipidemia Home meds:  none LDL 95, goal < 70 Add atorvastatin  Continue statin at discharge  Other Stroke Risk Factors Obesity, Body mass index is 38.45 kg/m., BMI >/= 30 associated with  increased stroke risk, recommend weight loss, diet and exercise as appropriate  Family hx stroke   Other Active Problems GERD Fibromyalgia  Hyperthyroidism Fibromyalgia   Hospital day # 1  Beulah Gandy DNP, ACNPC-AG    I have personally obtained history,examined this patient, reviewed notes, independently viewed imaging studies, participated in medical decision making and plan of care.ROS completed by me personally and pertinent positives fully documented  I have made any additions or clarifications directly to the above note. Agree with note above.  Patient presented with left-sided numbness and weakness and MRI scan shows tiny bicerebral punctate cortical infarcts likely of embolic etiology with source to be determined.  Recommend continue ongoing cardiac monitoring and she will likely need outpatient outpatient TEE followed by loop recorder monitor to look for paroxysmal A-fib.  Patient is allergic to aspirin hence use Plavix 75 mg daily alone for stroke prevention and aggressive risk factor modification.  Mobilize out of bed.  Therapy consults.  Long discussion with patient and daughter at the bedside and answered questions.  Greater than 50% time during this 50-minute visit was spent on counseling and coordination of care about her embolic strokes and discussion about stroke evaluation, prevention and treatment and answering questions Stroke team will sign off.  Follow-up as an outpatient stroke clinic in 8 weeks with nurse practitioner Antony Contras, MD Medical Director [facility] Pager: [phone] 06/23/2022 4:25 PM  To contact Stroke Continuity provider, please refer to [email or web address] After hours, contact General Neurology

## 2022-06-23 NOTE — ED Notes (Signed)
Pt concerned about possible rxn to plavix before taking. Pharmacy consulted. Recommend okay to give now.

## 2022-06-25 ENCOUNTER — Other Ambulatory Visit (HOSPITAL_COMMUNITY): Payer: Self-pay

## 2022-06-25 ENCOUNTER — Telehealth: Payer: Self-pay

## 2022-06-25 ENCOUNTER — Telehealth: Payer: Self-pay | Admitting: Cardiology

## 2022-06-25 NOTE — Telephone Encounter (Signed)
Pt calling because she was seen in hospital and they talked about doing a TEE, however they didn't do it on weekends and she wants to know if she still needs to sch that. Pt also would like to know if she needs to f/u with Dr. Harrell Gave as well as  f/u with EP on 07/17/22.

## 2022-06-25 NOTE — Telephone Encounter (Signed)
Triage, please call patient

## 2022-06-25 NOTE — Telephone Encounter (Signed)
Transition Care Management Unsuccessful Follow-up Telephone Call  Date of discharge and from where:  [facility] 06-23-22 Dx: acute CVA  Attempts:  1st Attempt  Reason for unsuccessful TCM follow-up call:  Left voice message   Juanda Crumble LPN [facility] Direct Dial [phone]   Transition Care Management Follow-up Telephone Call Date of discharge and from where: [facility] 06-23-22 Dx: acute CVA  How have you been since you were released from the hospital? Feeling pretty good  Any questions or concerns? No  Items Reviewed: Did the pt receive and understand the discharge instructions provided? Yes  Medications obtained and verified? Yes  Other? No  Any new allergies since your discharge? No  Dietary orders reviewed? Yes Do you have support at home? Yes   Home Care and Equipment/Supplies: Were home health services ordered? no If so, what is the name of the agency? na  Has the agency set up a time to come to the patient's home? not applicable Were any new equipment or medical supplies ordered?  No What is the name of the medical supply agency? na Were you able to get the supplies/equipment? not applicable Do you have any questions related to the use of the equipment or supplies? No  Functional Questionnaire: (I = Independent and D = Dependent) ADLs: I  Bathing/Dressing- I  Meal Prep- I  Eating- I  Maintaining continence- I  Transferring/Ambulation- I  Managing Meds- I  Follow up appointments reviewed:  PCP Hospital f/u appt confirmed? Yes  Scheduled to see Dr Etter Sjogren on 06-29-22 @ 1140am. [facility] f/u appt confirmed? Yes  Scheduled to see Dr Melvyn Novas on 07-06-22 @ 9am and Dr Charlcie Cradle on 07-17-22 at 1055am. Are transportation arrangements needed? No  If their condition worsens, is the pt aware to call PCP or go to the Emergency Dept.? Yes Was the patient provided with contact information for the PCP's office or ED? Yes Was to pt  encouraged to call back with questions or concerns? Yes   Juanda Crumble LPN [facility] Direct Dial [phone]

## 2022-06-25 NOTE — Telephone Encounter (Signed)
Follow up with EP as scheduled 07/17/22 to discuss possible loop recorder. This is a long term implantable monitor that allows [place] to assess for abnormal heart rhythm that might have precipitated her stroke.   Would recommend set up office visit with Dr. Harrell Gave in 1-2 weeks to discuss possible TEE after stroke.   Loel Dubonnet, NP

## 2022-06-25 NOTE — Telephone Encounter (Signed)
Left detailed message with recommendations from Laurann Montana, NP. Asked pt to call back.

## 2022-06-26 ENCOUNTER — Encounter: Payer: No Typology Code available for payment source | Admitting: Psychology

## 2022-06-29 ENCOUNTER — Encounter: Payer: Self-pay | Admitting: Family Medicine

## 2022-06-29 ENCOUNTER — Ambulatory Visit (INDEPENDENT_AMBULATORY_CARE_PROVIDER_SITE_OTHER): Payer: No Typology Code available for payment source | Admitting: Family Medicine

## 2022-06-29 VITALS — BP 124/73 | HR 72 | Temp 98.0°F | Resp 16 | Wt 229.0 lb

## 2022-06-29 DIAGNOSIS — I639 Cerebral infarction, unspecified: Secondary | ICD-10-CM

## 2022-06-29 HISTORY — DX: Cerebral infarction, unspecified: I63.9

## 2022-06-29 NOTE — Assessment & Plan Note (Addendum)
Pt is on plavix Neuro and cardiology app pending

## 2022-06-29 NOTE — Progress Notes (Signed)
Subjective:   By signing my name below, I, [name], attest that this documentation has been prepared under the direction and in the presence of [name], 06/29/2022.   Patient ID: Emily Osborne, female    DOB: [birth year], 61 y.o.   MRN: [id number]  Chief Complaint  Patient presents with   Follow-up    Here for hospital follow up    HPI Patient is in today for an office visit.  Hospitalization follow up She states that she was at work when she got a nagging headache on the top of her head. She toke a Tylenol, but then 5 minutes later she experienced numbness/tingling on left side of face that radiated down her neck. Patient was admitted to the ER on 06/22/2022-06/23/2022 for an acute CVA. Patient reports she has upcoming appointments with her pulmonary, neurology, endocrinology, and cardiology specialists. She states that she is feeling much better.   Health Maintenance Due  Topic Date Due   FOOT EXAM  08/30/2020   PAP SMEAR-Modifier  08/07/2021   OPHTHALMOLOGY EXAM  11/10/2021    Past Medical History:  Diagnosis Date   Allergy    Anxiety    Asthma    pt has inhaler   Back pain    C. difficile colitis    Cataract    bil eyes   Chest pain    Chronic fatigue syndrome    Clostridium difficile colitis 12/13/2016   Diverticulitis    Dyspnea    Dysrhythmia    palpitations   Edema    Elevated blood pressure reading without diagnosis of hypertension    "just elevated when I'm in pain" (12/07/2015)   Fatty liver    Fibromyalgia    Ganglion of joint    right wrist   GERD (gastroesophageal reflux disease)    H. pylori infection    Headache    hx of   Helicobacter pylori (H. pylori) infection 12/13/2016   Herpes zoster without mention of complication    HTN (hypertension)    Hyperlipidemia    Hyperthyroidism    IBS (irritable bowel syndrome)    Leg edema    Multiple drug allergies 06/18/2017   Myalgia    Nontraumatic rupture of Achilles tendon     Other acute reactions to stress    Pain in joint, shoulder region    Pain in joint, upper arm    Pain in limb    Palpitations    Personal history of other diseases of digestive system    Pneumonia    once   PONV (postoperative nausea and vomiting)    Routine screening for STI (sexually transmitted infection) 12/13/2016   S/P partial colectomy 06/18/2017   Thyroid disease    hyperthyroidism   Type 2 diabetes mellitus with hyperglycemia, without long-term current use of insulin ([place]) 08/07/2018   Vitamin B 12 deficiency     Past Surgical History:  Procedure Laterality Date   ABDOMINAL HYSTERECTOMY     ACHILLES TENDON REPAIR Right 2005   BUNIONECTOMY Bilateral    Bunionectomy 1983   COLON SURGERY  01/23/2017   6 to 8 inches sigmoid colon removed   COLONOSCOPY     GANGLION CYST EXCISION Right    wrist   LEFT HEART CATH AND CORONARY ANGIOGRAPHY N/A 10/03/2016   Procedure: Left Heart Cath and Coronary Angiography;  Surgeon: Burnell Blanks, MD;  Location: [facility];  Service: Cardiovascular;  Laterality: N/A;   MENISCUS REPAIR Right  04/2014   POLYPECTOMY     TUBAL LIGATION     wisdomteeth extraction      Family History  Problem Relation Age of Onset   Hypertension Father    Diabetes Father    Heart disease Father    Kidney disease Father        Died, 20   Hyperlipidemia Father    Leukemia Brother    Cancer Brother        myleoblastic anemia   Hyperlipidemia Mother    Hypertension Mother        Died, 25   Thyroid disease Mother        Thyroid surgery   Obesity Mother    Heart disease Mother    Pernicious anemia Sister    Thyroid disease Sister        On thyroid Rx   Lupus Daughter    Coronary artery disease Brother    Hypertension Brother    Colon cancer Neg Hx    Esophageal cancer Neg Hx    Rectal cancer Neg Hx    Stomach cancer Neg Hx    Pancreatic cancer Neg Hx    Prostate cancer Neg Hx    Colon polyps Neg Hx     Social History    Socioeconomic History   Marital status: Divorced    Spouse name: Not on file   Number of children: 2   Years of education: Not on file   Highest education level: Not on file  Occupational History   Occupation: Personnel officer  Tobacco Use   Smoking status: Never   Smokeless tobacco: Never  Vaping Use   Vaping Use: Never used  Substance and Sexual Activity   Alcohol use: No    Alcohol/week: 0.0 standard drinks of alcohol   Drug use: No   Sexual activity: Not Currently    Partners: Male  Other Topics Concern   Not on file  Social History Narrative   Lives with alone.  She has two daughter.   She works as a Programme researcher, broadcasting/film/video at [employer]--- no   Right Handed   Social Determinants of Radio broadcast assistant Strain: Not on file  Food Insecurity: Not on file  Transportation Needs: Not on file  Physical Activity: Not on file  Stress: Not on file  Social Connections: Not on file  Intimate Partner Violence: Not on file    Outpatient Medications Prior to Visit  Medication Sig Dispense Refill   acetaminophen (TYLENOL) 500 MG tablet Take 1,000 mg by mouth every 6 (six) hours as needed for moderate pain.     albuterol (PROAIR HFA) 108 (90 Base) MCG/ACT inhaler USE 2 PUFFS EVERY 4 HOURS AS NEEDED FOR COUGH, WHEEZE OR SHORTNESS OF BREATH. (Patient taking differently: Inhale 2 puffs into the lungs every 4 (four) hours as needed for wheezing or shortness of breath.) 6.7 g 0   albuterol (PROVENTIL HFA) 108 (90 Base) MCG/ACT inhaler Inhale 2 puffs into the lungs every 6 (six) hours as needed for wheezing or shortness of breath. 18 g 2   albuterol (VENTOLIN HFA) 108 (90 Base) MCG/ACT inhaler Inhale 2 puffs into the lungs every 4 (four) hours as needed for wheezing or shortness of breath. 18 g 2   ALPRAZolam (XANAX) 0.25 MG tablet TAKE 1 TABLET BY MOUTH THREE TIMES A DAY AS NEEDED (Patient taking differently: Take 0.25 mg by mouth 3 (three) times daily as needed for anxiety.) 30  tablet 2   AMBULATORY  NON FORMULARY MEDICATION Medication Name: [facility]  Apply to affected area as needed 30 g 11   atorvastatin (LIPITOR) 40 MG tablet Take 1 tablet (40 mg total) by mouth daily. 30 tablet 1   Blood Glucose Monitoring Suppl (FREESTYLE FREEDOM) KIT 1 Device by Does not apply route daily. 1 each 0   budesonide-formoterol (SYMBICORT) 80-4.5 MCG/ACT inhaler Inhale 2 puffs into the lungs 2 (two) times daily, in the morning and 12 hours later. 10.2 g 0   clopidogrel (PLAVIX) 75 MG tablet Take 1 tablet (75 mg total) by mouth daily. 30 tablet 2   dexlansoprazole (DEXILANT) 60 MG capsule Take 1 capsule (60 mg total) by mouth daily. 90 capsule 2   diclofenac sodium (VOLTAREN) 1 % GEL Apply 2 g topically 4 (four) times daily. Rub into affected area of foot 2 to 4 times daily (Patient taking differently: Apply 2 g topically 4 (four) times daily as needed (pain). Rub into affected area of foot 2 to 4 times daily) 100 g 2   famotidine (PEPCID) 20 MG tablet One after supper (Patient taking differently: Take 20 mg by mouth daily.) 30 tablet 11   fluticasone (FLONASE) 50 MCG/ACT nasal spray USE 2 SPRAYS INTO BOTH NOSTRILS DAILY. (Patient taking differently: Place 1 spray into both nostrils daily.) 16 g 5   glucose blood (FREESTYLE LITE) test strip USE TO CHECK BLOOD SUGAR ONCE A DAY 100 each 1   levocetirizine (XYZAL) 5 MG tablet TAKE 1 TABLET BY MOUTH EVERY EVENING. (Patient taking differently: Take 5 mg by mouth every evening.) 90 tablet 3   lidocaine (LIDODERM) 5 % Apply to the affected area once daily as needed for a week. **Remove patch after 12 hours** 30 patch 0   methimazole (TAPAZOLE) 5 MG tablet Take 1 tablet (5 mg total) by mouth 3 (three) times daily. (Patient taking differently: Take 5 mg by mouth daily.) 90 tablet 1   Probiotic Product (ALIGN) 4 MG CAPS Take 1 capsule (4 mg total) by mouth daily. 90 capsule 3    sucralfate (CARAFATE) 1 g tablet Take 1 tablet by mouth every 6 hours as needed. (Patient taking differently: Take 1 g by mouth every 6 (six) hours as needed (stomach issues).) 60 tablet 3   valACYclovir (VALTREX) 1000 MG tablet Take 1 tablet (1,000 mg total) by mouth 3 (three) times daily as needed. (Patient taking differently: Take 1,000 mg by mouth 3 (three) times daily as needed (fever blisters).) 30 tablet 2   No facility-administered medications prior to visit.    Allergies  Allergen Reactions   Aspirin Other (See Comments)    REACTION: anaphylaxis   Gentamicin Other (See Comments)    Eye drops turned the sclera bright red   Ibuprofen Other (See Comments)    REACTION: anaphylaxsis   Metronidazole Swelling    REACTION: red face/swelling   Nsaids Other (See Comments)    REACTION: anaphylaxis   Doxycycline Other (See Comments)    REACTION: severe nausea/vomiting   Influenza A (H1n1) [place] Other (See Comments)    REACTION: sick for 3 weeks   Loratadine Other (See Comments)    Fatigue/weakness   Metformin And Related Other (See Comments)    Memory issues and headache   Periactin [Cyproheptadine] Other (See Comments)    paranoid    Ranitidine Hcl Other (See Comments)    REACTION: Lips turned red /peel   Sulfamethoxazole-Trimethoprim Other (See Comments)    REACTION: face red/peel  Tramadol Hcl Other (See Comments)    REACTION: paranoid    Review of Systems  Constitutional:  Negative for chills, fever and malaise/fatigue.  HENT:  Negative for congestion and hearing loss.   Eyes:  Negative for blurred vision and discharge.  Respiratory:  Negative for cough, sputum production and shortness of breath.   Cardiovascular:  Negative for chest pain, palpitations and leg swelling.  Gastrointestinal:  Negative for abdominal pain, blood in stool, constipation, diarrhea, heartburn, nausea and vomiting.  Genitourinary:  Negative for dysuria, frequency, hematuria and urgency.   Musculoskeletal:  Negative for back pain, falls and myalgias.  Skin:  Negative for rash.  Neurological:  Negative for dizziness, sensory change, loss of consciousness, weakness and headaches.  Endo/Heme/Allergies:  Negative for environmental allergies. Does not bruise/bleed easily.  Psychiatric/Behavioral:  Negative for depression and suicidal ideas. The patient is not nervous/anxious and does not have insomnia.        Objective:    Physical Exam Vitals and nursing note reviewed.  Constitutional:      General: She is not in acute distress.    Appearance: Normal appearance. She is well-developed. She is not ill-appearing.  HENT:     Head: Normocephalic and atraumatic.     Right Ear: External ear normal.     Left Ear: External ear normal.  Eyes:     Extraocular Movements: Extraocular movements intact.     Conjunctiva/sclera: Conjunctivae normal.     Pupils: Pupils are equal, round, and reactive to light.  Neck:     Thyroid: No thyromegaly.     Vascular: No carotid bruit or JVD.  Cardiovascular:     Rate and Rhythm: Normal rate and regular rhythm.     Heart sounds: Normal heart sounds. No murmur heard.    No gallop.  Pulmonary:     Effort: Pulmonary effort is normal. No respiratory distress.     Breath sounds: Normal breath sounds. No wheezing or rales.  Chest:     Chest wall: No tenderness.  Musculoskeletal:     Cervical back: Normal range of motion and neck supple.  Skin:    General: Skin is warm and dry.  Neurological:     Mental Status: She is alert and oriented to person, place, and time.  Psychiatric:        Judgment: Judgment normal.     BP 124/73 (BP Location: Right Arm, Patient Position: Sitting, Cuff Size: Large)   Pulse 72   Temp 98 F (36.7 C) (Oral)   Resp 16   Wt 229 lb (103.9 kg)   SpO2 100%   BMI 38.11 kg/m  Wt Readings from Last 3 Encounters:  06/29/22 229 lb (103.9 kg)  06/22/22 231 lb 0.7 oz (104.8 kg)  06/15/22 231 lb 3.2 oz (104.9 kg)        Assessment & Plan:   Problem List Items Addressed This Visit       Unprioritized   Cerebrovascular accident (CVA) ([place]) - Primary   Acute cerebrovascular accident (CVA) ([place])    Pt is on plavix Neuro and cardiology app pending        No orders of the defined types were placed in this encounter.   I, [name], personally preformed the services described in this documentation.  All medical record entries made by the scribe were at my direction and in my presence.  I have reviewed the chart and discharge instructions (if applicable) and agree that the record reflects my personal performance  and is accurate and complete. 06/29/2022.   I,Verona Buck,acting as a Education administrator for [employer], DO.,have documented all relevant documentation on the behalf of [name], DO,as directed by  [name], DO while in the presence of [name], DO.    [name], DO

## 2022-06-29 NOTE — Patient Instructions (Signed)
Driving After a Stroke Driving after a stroke can be dangerous because a stroke can cause physical, emotional, cognitive, and behavioral changes. Damage to your brain and other parts of your nervous system may affect your ability to drive. You may have weakness, stiffness, and pain. You also may have problems moving, talking, seeing, touching, or problem-solving. A stroke can also cause an inability to move, or paralysis, on one side of your body. How does a stroke affect my driving? A family member may be the first to notice that it is not safe for you to drive. You may have problems with: Your vision. Talking and communicating. Weakness, pain, and stiffness in your arms or legs. Reacting quickly to changes on the road. Using the steering wheel, pedals, and other parts of the car. Thinking while driving. Judgment on the road. How do I recognize changes in my ability to drive? If you do drive, signs that driving may be unsafe for you include: Driving too fast or too slow. Needing help from others while driving. Not paying attention to street signs or signals. Making bad decisions while driving. Not keeping enough distance between cars. Drifting into other lanes. Becoming confused, angry, or frustrated. Getting lost in familiar places. Having accidents while driving. What actions can I take to manage driving after a stroke?  Talk to your health care provider Ask your health care provider when it is safe for you to drive. Laws on driving after a stroke vary by state. Your health care provider may recommend that you: Get a driving evaluation to have your vision, thinking, reaction time, and driving skills tested. Take a driving rehabilitation program for people who have had a stroke. Take a driving class or a retraining program. Ask about safety devices for drivers Adaptive equipment refers to devices that can help people who have had a stroke to drive and do other activities. You may  need: A wheelchair-accessible car. Special hand controls in the car. Pedal extensions for the car. A seat base to help you stay positioned in your seat. Lifts and ramps to help you get in and out of the car. Where to find more information American Stroke Association: www.stroke.org Summary Damage to your brain and other parts of your nervous system may affect your ability to drive. Ask your health care provider when it is safe for you to drive. They may recommend that you get a driving evaluation or take a driving class. A family member may be the first to notice that it is not safe for you to drive. You may need adaptive equipment to drive safely. This information is not intended to replace advice given to you by your health care provider. Make sure you discuss any questions you have with your health care provider. Document Revised: 11/06/2021 Document Reviewed: 11/06/2021 Elsevier Patient Education  [place].

## 2022-07-04 ENCOUNTER — Other Ambulatory Visit (HOSPITAL_COMMUNITY): Payer: Self-pay | Admitting: Cardiology

## 2022-07-04 ENCOUNTER — Encounter (HOSPITAL_BASED_OUTPATIENT_CLINIC_OR_DEPARTMENT_OTHER): Payer: Self-pay | Admitting: Cardiology

## 2022-07-04 ENCOUNTER — Other Ambulatory Visit (HOSPITAL_COMMUNITY): Payer: Self-pay

## 2022-07-04 ENCOUNTER — Other Ambulatory Visit: Payer: Self-pay | Admitting: Family Medicine

## 2022-07-04 ENCOUNTER — Other Ambulatory Visit (HOSPITAL_BASED_OUTPATIENT_CLINIC_OR_DEPARTMENT_OTHER): Payer: Self-pay | Admitting: Cardiology

## 2022-07-04 ENCOUNTER — Ambulatory Visit (HOSPITAL_BASED_OUTPATIENT_CLINIC_OR_DEPARTMENT_OTHER): Payer: No Typology Code available for payment source | Admitting: Cardiology

## 2022-07-04 VITALS — BP 129/86 | HR 70 | Ht 65.0 in | Wt 232.3 lb

## 2022-07-04 DIAGNOSIS — Z01812 Encounter for preprocedural laboratory examination: Secondary | ICD-10-CM | POA: Diagnosis not present

## 2022-07-04 DIAGNOSIS — Z8673 Personal history of transient ischemic attack (TIA), and cerebral infarction without residual deficits: Secondary | ICD-10-CM

## 2022-07-04 DIAGNOSIS — Z7189 Other specified counseling: Secondary | ICD-10-CM | POA: Diagnosis not present

## 2022-07-04 DIAGNOSIS — E0500 Thyrotoxicosis with diffuse goiter without thyrotoxic crisis or storm: Secondary | ICD-10-CM

## 2022-07-04 LAB — CBC WITH DIFFERENTIAL/PLATELET
Basophils Absolute: 0 10*3/uL (ref 0.0–0.2)
Basos: 0 %
EOS (ABSOLUTE): 0.3 10*3/uL (ref 0.0–0.4)
Eos: 5 %
Hematocrit: 42.2 % (ref 34.0–46.6)
Hemoglobin: 13.8 g/dL (ref 11.1–15.9)
Immature Grans (Abs): 0 10*3/uL (ref 0.0–0.1)
Immature Granulocytes: 0 %
Lymphocytes Absolute: 2.3 10*3/uL (ref 0.7–3.1)
Lymphs: 46 %
MCH: 28.4 pg (ref 26.6–33.0)
MCHC: 32.7 g/dL (ref 31.5–35.7)
MCV: 87 fL (ref 79–97)
Monocytes Absolute: 0.6 10*3/uL (ref 0.1–0.9)
Monocytes: 11 %
Neutrophils Absolute: 2 10*3/uL (ref 1.4–7.0)
Neutrophils: 38 %
Platelets: 247 10*3/uL (ref 150–450)
RBC: 4.86 x10E6/uL (ref 3.77–5.28)
RDW: 15.8 % — ABNORMAL HIGH (ref 11.7–15.4)
WBC: 5.1 10*3/uL (ref 3.4–10.8)

## 2022-07-04 LAB — BASIC METABOLIC PANEL
BUN/Creatinine Ratio: 13 (ref 12–28)
BUN: 12 mg/dL (ref 8–27)
CO2: 24 mmol/L (ref 20–29)
Calcium: 10 mg/dL (ref 8.7–10.3)
Chloride: 105 mmol/L (ref 96–106)
Creatinine, Ser: 0.95 mg/dL (ref 0.57–1.00)
Glucose: 84 mg/dL (ref 70–99)
Potassium: 4.8 mmol/L (ref 3.5–5.2)
Sodium: 143 mmol/L (ref 134–144)
eGFR: 68 mL/min/{1.73_m2} (ref >59)

## 2022-07-04 MED ORDER — METHIMAZOLE 5 MG PO TABS
5.0000 mg | ORAL_TABLET | Freq: Three times a day (TID) | ORAL | 1 refills | Status: DC
  Filled 2022-07-04: qty 90, 30d supply, fill #0
  Filled 2022-08-27: qty 90, 30d supply, fill #1

## 2022-07-04 NOTE — Progress Notes (Signed)
Cardiology Office Note:   Date:  07/04/2022   ID:  Akasia, Ahmad 11/26/1960, MRN [id number]  PCP:  Carollee Herter, Alferd Apa, DO  Cardiologist:  Buford Dresser, MD   Chief Complaint:  re-establish care/recent CVA  History of Present Illness:    Emily Osborne is a 61 y.o. female with recent CVA who is re-establishing care in cardiology. She has not been seen in the office for >3 years.  She was admitted to the hospital 06/22/2022 with concerns for sudden onset of headache late morning followed by numbness on the left side of her face. Acute CVA was confirmed on imaging. There was concern for embolic disease but without clear etiology. Per neurology an outpatient TEE and heart monitor were recommended. She was discharged on plavix and statin. Her TEE 06/23/2022 revealed LVEF 60-65%, mild LVH.  Today, she is feeling better since her stroke. She is scheduled for a loop recorder implant on 07/17/22. We reviewed the procedural details of a TEE at length. After shared decision making she would like to proceed.  Occasionally she still notices a little chest discomfort. Generally, since being on Pepcid she denies any recurring chest pains.  Earlier this summer she was started on methimazole for her thyroid. She initially developed mild LE edema that resolved with taking Lasix. Currently she no longer needs to take the Lasix.  On Friday she is scheduled to follow up with pulmonology regarding her asthma.  She denies any palpitations, lightheadedness, headaches, syncope, orthopnea, or PND.   Past Medical History:  Diagnosis Date   Allergy    Anxiety    Asthma    pt has inhaler   Back pain    C. difficile colitis    Cataract    bil eyes   Chest pain    Chronic fatigue syndrome    Clostridium difficile colitis 12/13/2016   Diverticulitis    Dyspnea    Dysrhythmia    palpitations   Edema    Elevated blood pressure reading without diagnosis of hypertension    "just  elevated when I'm in pain" (12/07/2015)   Fatty liver    Fibromyalgia    Ganglion of joint    right wrist   GERD (gastroesophageal reflux disease)    H. pylori infection    Headache    hx of   Helicobacter pylori (H. pylori) infection 12/13/2016   Herpes zoster without mention of complication    HTN (hypertension)    Hyperlipidemia    Hyperthyroidism    IBS (irritable bowel syndrome)    Leg edema    Multiple drug allergies 06/18/2017   Myalgia    Nontraumatic rupture of Achilles tendon    Other acute reactions to stress    Pain in joint, shoulder region    Pain in joint, upper arm    Pain in limb    Palpitations    Personal history of other diseases of digestive system    Pneumonia    once   PONV (postoperative nausea and vomiting)    Routine screening for STI (sexually transmitted infection) 12/13/2016   S/P partial colectomy 06/18/2017   Thyroid disease    hyperthyroidism   Type 2 diabetes mellitus with hyperglycemia, without long-term current use of insulin ([place]) 08/07/2018   Vitamin B 12 deficiency    Past Surgical History:  Procedure Laterality Date   ABDOMINAL HYSTERECTOMY     ACHILLES TENDON REPAIR Right 2005   BUNIONECTOMY Bilateral    Bunionectomy 1983  COLON SURGERY  01/23/2017   6 to 8 inches sigmoid colon removed   COLONOSCOPY     GANGLION CYST EXCISION Right    wrist   LEFT HEART CATH AND CORONARY ANGIOGRAPHY N/A 10/03/2016   Procedure: Left Heart Cath and Coronary Angiography;  Surgeon: Burnell Blanks, MD;  Location: [facility];  Service: Cardiovascular;  Laterality: N/A;   MENISCUS REPAIR Right 04/2014   POLYPECTOMY     TUBAL LIGATION     wisdomteeth extraction       No outpatient medications have been marked as taking for the 07/04/22 encounter (Office Visit) with Buford Dresser, MD.     Allergies:   Aspirin, Gentamicin, Ibuprofen, Metronidazole, Nsaids, Doxycycline, Influenza a (h1n1) monoval vac, Loratadine, Metformin and  related, Periactin [cyproheptadine], Ranitidine hcl, Sulfamethoxazole-trimethoprim, and Tramadol hcl   Social History   Tobacco Use   Smoking status: Never   Smokeless tobacco: Never  Vaping Use   Vaping Use: Never used  Substance Use Topics   Alcohol use: No    Alcohol/week: 0.0 standard drinks of alcohol   Drug use: No     Family Hx: The patient's family history includes Cancer in her brother; Coronary artery disease in her brother; Diabetes in her father; Heart disease in her father and mother; Hyperlipidemia in her father and mother; Hypertension in her brother, father, and mother; Kidney disease in her father; Leukemia in her brother; Lupus in her daughter; Obesity in her mother; Pernicious anemia in her sister; Thyroid disease in her mother and sister. There is no history of Colon cancer, Esophageal cancer, Rectal cancer, Stomach cancer, Pancreatic cancer, Prostate cancer, or Colon polyps.  ROS:   Please see the history of present illness. (+) Occasional chest discomfort All other systems are reviewed and negative.   Prior CV studies:   The following studies were reviewed today: Prior cath, echo, monitor  Echo  06/23/2022:  1. Left ventricular ejection fraction, by estimation, is 60 to 65%. The  left ventricle has normal function. The left ventricle has no regional  wall motion abnormalities. There is mild left ventricular hypertrophy.  Left ventricular diastolic parameters  were normal.   2. Right ventricular systolic function is normal. The right ventricular  size is normal. There is normal pulmonary artery systolic pressure. The  estimated right ventricular systolic pressure is 68.0 mmHg.   3. The mitral valve is normal in structure. Trivial mitral valve  regurgitation. No evidence of mitral stenosis.   4. The aortic valve is tricuspid. Aortic valve regurgitation is trivial.  No aortic stenosis is present.   5. The inferior vena cava is normal in size with greater than  50%  respiratory variability, suggesting right atrial pressure of 3 mmHg.   CTA Head/Neck  06/22/2022: IMPRESSION: 1. Negative CTA of the head and neck. No large vessel occlusion or other emergent finding. No hemodynamically significant or correctable stenosis. 2. Diffuse tortuosity of the major arterial vasculature of the head and neck, suggesting chronic underlying hypertension.  Right UE Venous Doppler  03/16/2022: Summary:    Right:  No evidence of deep vein thrombosis in the upper extremity. No evidence of  superficial vein thrombosis in the upper extremity.    Left:  No evidence of thrombosis in the subclavian.   Monitor 09/2018: ~14 days of data recorded on Zio monitor. No VT, atrial fibrillation, high degree block, or pauses noted. Isolated atrial and ventricular ectopy was rare (<1%). There were 8 patient triggered events, all of which  were normal sinus rhythm. Patient had a min HR of 49 bpm, max HR of 154 bpm, and avg HR of 73 bpm. Predominant underlying rhythm was Sinus Rhythm. 1 run of Supraventricular Tachycardia occurred lasting 5 beats with a max rate of 154 bpm (avg 116 bpm).  This occurred on 09-15-18 at 12:42 AM. Rate is irregular but brief.   Left Heart Cath  10/03/2016: The left ventricular systolic function is normal. LV end diastolic pressure is normal. The left ventricular ejection fraction is greater than 65% by visual estimate. There is no mitral valve regurgitation.   1. No angiographic evidence of CAD 2. Normal LV systolic function 3. Normal LVEDP   Recommendations: NO further ischemic workup.     Labs/Other Tests and Data Reviewed:    EKG:  EKG is personally reviewed. 07/04/2022:  not ordered today 11/27/2018:  NSR, hr 63   Recent Labs: 06/15/2022: TSH 0.46 06/22/2022: ALT 21; BUN 11; Creatinine, Ser 0.81; Hemoglobin 14.4; Platelets 200; Potassium 4.1; Sodium 140   Recent Lipid Panel Lab Results  Component Value Date/Time   CHOL 163 06/23/2022  05:04 AM   CHOL 197 03/20/2018 10:46 AM   TRIG 89 06/23/2022 05:04 AM   HDL 50 06/23/2022 05:04 AM   HDL 57 03/20/2018 10:46 AM   CHOLHDL 3.3 06/23/2022 05:04 AM   LDLCALC 95 06/23/2022 05:04 AM   LDLCALC 124 (H) 03/20/2018 10:46 AM   LDLDIRECT 139.4 12/26/2011 09:05 AM    Wt Readings from Last 3 Encounters:  07/04/22 232 lb 4.8 oz (105.4 kg)  06/29/22 229 lb (103.9 kg)  06/22/22 231 lb 0.7 oz (104.8 kg)     Objective:    Vital Signs:  BP 129/86 (BP Location: Left Arm, Patient Position: Sitting, Cuff Size: Large)   Pulse 70   Ht '5\' 5"'$  (1.651 m)   Wt 232 lb 4.8 oz (105.4 kg)   SpO2 97%   BMI 38.66 kg/m    GEN: Well nourished, well developed in no acute distress HEENT: Normal, moist mucous membranes NECK: No JVD CARDIAC: regular rhythm, normal S1 and S2, no rubs or gallops. No murmur. VASCULAR: Radial and DP pulses 2+ bilaterally. No carotid bruits RESPIRATORY:  Clear to auscultation without rales, wheezing or rhonchi  ABDOMEN: Soft, non-tender, non-distended MUSCULOSKELETAL:  Ambulates independently SKIN: Warm and dry, no edema NEUROLOGIC:  Alert and oriented x 3. No focal neuro deficits noted. PSYCHIATRIC:  Normal affect    ASSESSMENT & PLAN:    1. History of CVA (cerebrovascular accident)   2. Pre-procedure lab exam   3. Cardiac risk counseling   4. Counseling on health promotion and disease prevention     CVA -based on MRI and neuro notes, concern for embolic source -we discussed TEE at length. She is amenable -Shared Decision Making/Informed Consent The risks [esophageal damage, perforation (1:10,000 risk), bleeding, pharyngeal hematoma as well as other potential complications associated with conscious sedation including aspiration, arrhythmia, respiratory failure and death], benefits (treatment guidance and diagnostic support) and alternatives of a transesophageal echocardiogram were discussed in detail with Ms. Burdo and she is willing to proceed.   -BMET and  CBC prior to TEE -If PFO found, will refer to Dr. Burt Knack to discuss closure. If no PFO, then would continue with plan for loop recorder -continue clopidogrel, statin   Secondary prevention -recommend heart healthy/Mediterranean diet, with whole grains, fruits, vegetable, fish, lean meats, nuts, and olive oil. Limit salt. -recommend moderate walking, 3-5 times/week for 30-50 minutes each session. Aim for  at least 150 minutes.week. Goal should be pace of 3 miles/hours, or walking 1.5 miles in 30 minutes -recommend avoidance of tobacco products. Avoid excess alcohol.  Disposition:  TBD based on results of testing, or sooner as needed.  Medication Adjustments/Labs and Tests Ordered: Current medicines are reviewed at length with the patient today.  Concerns regarding medicines are outlined above.   Tests Ordered: Orders Placed This Encounter  Procedures   CBC with Differential/Platelet   Basic metabolic panel   Medication Changes: No orders of the defined types were placed in this encounter.  I,Mathew Stumpf,acting as a Education administrator for [employer], MD.,have documented all relevant documentation on the behalf of Buford Dresser, MD,as directed by  Buford Dresser, MD while in the presence of Buford Dresser, MD.  I, Buford Dresser, MD, have reviewed all documentation for this visit. The documentation on 07/04/22 for the exam, diagnosis, procedures, and orders are all accurate and complete.   Signed, Buford Dresser, MD  07/04/2022 12:27 PM    [facility]

## 2022-07-04 NOTE — H&P (View-Only) (Signed)
Cardiology Office Note:   Date:  07/04/2022   ID:  Emily, Osborne February 28, 1961, MRN [id number]  PCP:  Carollee Herter, Alferd Apa, DO  Cardiologist:  Buford Dresser, MD   Chief Complaint:  re-establish care/recent CVA  History of Present Illness:    Emily Osborne is a 61 y.o. female with recent CVA who is re-establishing care in cardiology. She has not been seen in the office for >3 years.  She was admitted to the hospital 06/22/2022 with concerns for sudden onset of headache late morning followed by numbness on the left side of her face. Acute CVA was confirmed on imaging. There was concern for embolic disease but without clear etiology. Per neurology an outpatient TEE and heart monitor were recommended. She was discharged on plavix and statin. Her TEE 06/23/2022 revealed LVEF 60-65%, mild LVH.  Today, she is feeling better since her stroke. She is scheduled for a loop recorder implant on 07/17/22. We reviewed the procedural details of a TEE at length. After shared decision making she would like to proceed.  Occasionally she still notices a little chest discomfort. Generally, since being on Pepcid she denies any recurring chest pains.  Earlier this summer she was started on methimazole for her thyroid. She initially developed mild LE edema that resolved with taking Lasix. Currently she no longer needs to take the Lasix.  On Friday she is scheduled to follow up with pulmonology regarding her asthma.  She denies any palpitations, lightheadedness, headaches, syncope, orthopnea, or PND.   Past Medical History:  Diagnosis Date   Allergy    Anxiety    Asthma    pt has inhaler   Back pain    C. difficile colitis    Cataract    bil eyes   Chest pain    Chronic fatigue syndrome    Clostridium difficile colitis 12/13/2016   Diverticulitis    Dyspnea    Dysrhythmia    palpitations   Edema    Elevated blood pressure reading without diagnosis of hypertension    "just  elevated when I'm in pain" (12/07/2015)   Fatty liver    Fibromyalgia    Ganglion of joint    right wrist   GERD (gastroesophageal reflux disease)    H. pylori infection    Headache    hx of   Helicobacter pylori (H. pylori) infection 12/13/2016   Herpes zoster without mention of complication    HTN (hypertension)    Hyperlipidemia    Hyperthyroidism    IBS (irritable bowel syndrome)    Leg edema    Multiple drug allergies 06/18/2017   Myalgia    Nontraumatic rupture of Achilles tendon    Other acute reactions to stress    Pain in joint, shoulder region    Pain in joint, upper arm    Pain in limb    Palpitations    Personal history of other diseases of digestive system    Pneumonia    once   PONV (postoperative nausea and vomiting)    Routine screening for STI (sexually transmitted infection) 12/13/2016   S/P partial colectomy 06/18/2017   Thyroid disease    hyperthyroidism   Type 2 diabetes mellitus with hyperglycemia, without long-term current use of insulin ([place]) 08/07/2018   Vitamin B 12 deficiency    Past Surgical History:  Procedure Laterality Date   ABDOMINAL HYSTERECTOMY     ACHILLES TENDON REPAIR Right 2005   BUNIONECTOMY Bilateral    Bunionectomy 1983  COLON SURGERY  01/23/2017   6 to 8 inches sigmoid colon removed   COLONOSCOPY     GANGLION CYST EXCISION Right    wrist   LEFT HEART CATH AND CORONARY ANGIOGRAPHY N/A 10/03/2016   Procedure: Left Heart Cath and Coronary Angiography;  Surgeon: Burnell Blanks, MD;  Location: [facility];  Service: Cardiovascular;  Laterality: N/A;   MENISCUS REPAIR Right 04/2014   POLYPECTOMY     TUBAL LIGATION     wisdomteeth extraction       No outpatient medications have been marked as taking for the 07/04/22 encounter (Office Visit) with Buford Dresser, MD.     Allergies:   Aspirin, Gentamicin, Ibuprofen, Metronidazole, Nsaids, Doxycycline, Influenza a (h1n1) monoval vac, Loratadine, Metformin and  related, Periactin [cyproheptadine], Ranitidine hcl, Sulfamethoxazole-trimethoprim, and Tramadol hcl   Social History   Tobacco Use   Smoking status: Never   Smokeless tobacco: Never  Vaping Use   Vaping Use: Never used  Substance Use Topics   Alcohol use: No    Alcohol/week: 0.0 standard drinks of alcohol   Drug use: No     Family Hx: The patient's family history includes Cancer in her brother; Coronary artery disease in her brother; Diabetes in her father; Heart disease in her father and mother; Hyperlipidemia in her father and mother; Hypertension in her brother, father, and mother; Kidney disease in her father; Leukemia in her brother; Lupus in her daughter; Obesity in her mother; Pernicious anemia in her sister; Thyroid disease in her mother and sister. There is no history of Colon cancer, Esophageal cancer, Rectal cancer, Stomach cancer, Pancreatic cancer, Prostate cancer, or Colon polyps.  ROS:   Please see the history of present illness. (+) Occasional chest discomfort All other systems are reviewed and negative.   Prior CV studies:   The following studies were reviewed today: Prior cath, echo, monitor  Echo  06/23/2022:  1. Left ventricular ejection fraction, by estimation, is 60 to 65%. The  left ventricle has normal function. The left ventricle has no regional  wall motion abnormalities. There is mild left ventricular hypertrophy.  Left ventricular diastolic parameters  were normal.   2. Right ventricular systolic function is normal. The right ventricular  size is normal. There is normal pulmonary artery systolic pressure. The  estimated right ventricular systolic pressure is 27.5 mmHg.   3. The mitral valve is normal in structure. Trivial mitral valve  regurgitation. No evidence of mitral stenosis.   4. The aortic valve is tricuspid. Aortic valve regurgitation is trivial.  No aortic stenosis is present.   5. The inferior vena cava is normal in size with greater than  50%  respiratory variability, suggesting right atrial pressure of 3 mmHg.   CTA Head/Neck  06/22/2022: IMPRESSION: 1. Negative CTA of the head and neck. No large vessel occlusion or other emergent finding. No hemodynamically significant or correctable stenosis. 2. Diffuse tortuosity of the major arterial vasculature of the head and neck, suggesting chronic underlying hypertension.  Right UE Venous Doppler  03/16/2022: Summary:    Right:  No evidence of deep vein thrombosis in the upper extremity. No evidence of  superficial vein thrombosis in the upper extremity.    Left:  No evidence of thrombosis in the subclavian.   Monitor 09/2018: ~14 days of data recorded on Zio monitor. No VT, atrial fibrillation, high degree block, or pauses noted. Isolated atrial and ventricular ectopy was rare (<1%). There were 8 patient triggered events, all of which  were normal sinus rhythm. Patient had a min HR of 49 bpm, max HR of 154 bpm, and avg HR of 73 bpm. Predominant underlying rhythm was Sinus Rhythm. 1 run of Supraventricular Tachycardia occurred lasting 5 beats with a max rate of 154 bpm (avg 116 bpm).  This occurred on 09-15-18 at 12:42 AM. Rate is irregular but brief.   Left Heart Cath  10/03/2016: The left ventricular systolic function is normal. LV end diastolic pressure is normal. The left ventricular ejection fraction is greater than 65% by visual estimate. There is no mitral valve regurgitation.   1. No angiographic evidence of CAD 2. Normal LV systolic function 3. Normal LVEDP   Recommendations: NO further ischemic workup.     Labs/Other Tests and Data Reviewed:    EKG:  EKG is personally reviewed. 07/04/2022:  not ordered today 11/27/2018:  NSR, hr 63   Recent Labs: 06/15/2022: TSH 0.46 06/22/2022: ALT 21; BUN 11; Creatinine, Ser 0.81; Hemoglobin 14.4; Platelets 200; Potassium 4.1; Sodium 140   Recent Lipid Panel Lab Results  Component Value Date/Time   CHOL 163 06/23/2022  05:04 AM   CHOL 197 03/20/2018 10:46 AM   TRIG 89 06/23/2022 05:04 AM   HDL 50 06/23/2022 05:04 AM   HDL 57 03/20/2018 10:46 AM   CHOLHDL 3.3 06/23/2022 05:04 AM   LDLCALC 95 06/23/2022 05:04 AM   LDLCALC 124 (H) 03/20/2018 10:46 AM   LDLDIRECT 139.4 12/26/2011 09:05 AM    Wt Readings from Last 3 Encounters:  07/04/22 232 lb 4.8 oz (105.4 kg)  06/29/22 229 lb (103.9 kg)  06/22/22 231 lb 0.7 oz (104.8 kg)     Objective:    Vital Signs:  BP 129/86 (BP Location: Left Arm, Patient Position: Sitting, Cuff Size: Large)   Pulse 70   Ht '5\' 5"'$  (1.651 m)   Wt 232 lb 4.8 oz (105.4 kg)   SpO2 97%   BMI 38.66 kg/m    GEN: Well nourished, well developed in no acute distress HEENT: Normal, moist mucous membranes NECK: No JVD CARDIAC: regular rhythm, normal S1 and S2, no rubs or gallops. No murmur. VASCULAR: Radial and DP pulses 2+ bilaterally. No carotid bruits RESPIRATORY:  Clear to auscultation without rales, wheezing or rhonchi  ABDOMEN: Soft, non-tender, non-distended MUSCULOSKELETAL:  Ambulates independently SKIN: Warm and dry, no edema NEUROLOGIC:  Alert and oriented x 3. No focal neuro deficits noted. PSYCHIATRIC:  Normal affect    ASSESSMENT & PLAN:    1. History of CVA (cerebrovascular accident)   2. Pre-procedure lab exam   3. Cardiac risk counseling   4. Counseling on health promotion and disease prevention     CVA -based on MRI and neuro notes, concern for embolic source -we discussed TEE at length. She is amenable -Shared Decision Making/Informed Consent The risks [esophageal damage, perforation (1:10,000 risk), bleeding, pharyngeal hematoma as well as other potential complications associated with conscious sedation including aspiration, arrhythmia, respiratory failure and death], benefits (treatment guidance and diagnostic support) and alternatives of a transesophageal echocardiogram were discussed in detail with Ms. Paschal and she is willing to proceed.   -BMET and  CBC prior to TEE -If PFO found, will refer to Dr. Burt Knack to discuss closure. If no PFO, then would continue with plan for loop recorder -continue clopidogrel, statin   Secondary prevention -recommend heart healthy/Mediterranean diet, with whole grains, fruits, vegetable, fish, lean meats, nuts, and olive oil. Limit salt. -recommend moderate walking, 3-5 times/week for 30-50 minutes each session. Aim for  at least 150 minutes.week. Goal should be pace of 3 miles/hours, or walking 1.5 miles in 30 minutes -recommend avoidance of tobacco products. Avoid excess alcohol.  Disposition:  TBD based on results of testing, or sooner as needed.  Medication Adjustments/Labs and Tests Ordered: Current medicines are reviewed at length with the patient today.  Concerns regarding medicines are outlined above.   Tests Ordered: Orders Placed This Encounter  Procedures   CBC with Differential/Platelet   Basic metabolic panel   Medication Changes: No orders of the defined types were placed in this encounter.  I,Mathew Stumpf,acting as a Education administrator for [employer], MD.,have documented all relevant documentation on the behalf of Buford Dresser, MD,as directed by  Buford Dresser, MD while in the presence of Buford Dresser, MD.  I, Buford Dresser, MD, have reviewed all documentation for this visit. The documentation on 07/04/22 for the exam, diagnosis, procedures, and orders are all accurate and complete.   Signed, Buford Dresser, MD  07/04/2022 12:27 PM    [facility]

## 2022-07-04 NOTE — Patient Instructions (Signed)
Medication Instructions:  Your physician recommends that you continue on your current medications as directed. Please refer to the Current Medication list given to you today.  Labwork: CBC/BMET TODAY   Testing/Procedures: Your physician has requested that you have a TEE. During a TEE, sound waves are used to create images of your heart. It provides your doctor with information about the size and shape of your heart and how well your heart's chambers and valves are working. In this test, a transducer is attached to the end of a flexible tube that is guided down you throat and into your esophagus (the tube leading from your mouth to your stomach) to get a more detailed image of your heart. Once the TEE has determined that a blood clot is not present, the cardioversion begins. Electrical Cardioversion uses a jolt of electricity to your heart either through paddles or wired patches attached to your chest. This is a controlled, usually prescheduled, procedure. This procedure is done at the hospital and you are not awake during the procedure. You usually go home the day of the procedure. Please see the instruction sheet given to you today for more information.  Follow-Up: TO BE DETERMINED AFTER TEE  Any Other Special Instructions Will Be Listed Below (If Applicable).      You are scheduled for a TEE (Transesophageal Echocardiogram) on Monday, November 20 with Dr. Harrell Gave.  Please arrive at the [facility] (Main Entrance A) at [facility]: [address] [place], [place] [address] at 7:00 AM.  DIET:  Nothing to eat or drink after midnight except a sip of water with medications (see medication instructions below)  MEDICATION INSTRUCTIONS: OK TO TAKE YOUR MEDICATIONS MORNING OF   LABS:   Come to TODAY   FYI:  For your safety, and to allow [place] to monitor your vital signs accurately during the surgery/procedure we request: If you have artificial nails, gel coating, SNS etc, please have  those removed prior to your surgery/procedure. Not having the nail coverings /polish removed may result in cancellation or delay of your surgery/procedure.  You must have a responsible person to drive you home and stay in the waiting area during your procedure. Failure to do so could result in cancellation.  Bring your insurance cards.  *Special Note: Every effort is made to have your procedure done on time. Occasionally there are emergencies that occur at the hospital that may cause delays. Please be patient if a delay does occur.

## 2022-07-05 ENCOUNTER — Other Ambulatory Visit (HOSPITAL_COMMUNITY): Payer: Self-pay

## 2022-07-06 ENCOUNTER — Other Ambulatory Visit (HOSPITAL_COMMUNITY): Payer: Self-pay

## 2022-07-06 ENCOUNTER — Ambulatory Visit: Payer: No Typology Code available for payment source | Admitting: Internal Medicine

## 2022-07-06 ENCOUNTER — Encounter: Payer: Self-pay | Admitting: Internal Medicine

## 2022-07-06 VITALS — BP 122/82 | HR 73 | Temp 98.2°F | Ht 65.0 in | Wt 230.0 lb

## 2022-07-06 DIAGNOSIS — R058 Other specified cough: Secondary | ICD-10-CM

## 2022-07-06 DIAGNOSIS — E669 Obesity, unspecified: Secondary | ICD-10-CM | POA: Diagnosis not present

## 2022-07-06 MED ORDER — BUDESONIDE-FORMOTEROL FUMARATE 80-4.5 MCG/ACT IN AERO
2.0000 | INHALATION_SPRAY | Freq: Two times a day (BID) | RESPIRATORY_TRACT | 11 refills | Status: DC
  Filled 2022-07-06 – 2023-06-05 (×2): qty 10.2, 30d supply, fill #0

## 2022-07-06 NOTE — Progress Notes (Signed)
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 onset in teenage years   variable doe/subjective wheeze/ chronic non-cardiac cp much worse sob x 6 weeks and lives with daughter from [place] who tested positive for COVID-19 but this was not disclosed prior to her arrival here as pt says "daughter's been fine since about 5/9 or 5/10 and "I don't think I have it "    referred to pulmonary clinic 01/01/2019 by Dr   Laury Axon for sob   History of Present Illness  01/01/2019  Pulmonary/ 1st office eval/Sharie Amorin / maint on advair 250 dpi  Chief Complaint  Patient presents with   Pulmonary Consult    Referred by Dr Laury Axon- SOB since end of April. She has cough- non prod. She has had sweats but no fever.   Dyspnea: "only when I walk" but says  walked a mile 12/29/2018 Cough: dry daytime/ urge to clear throat is the predominant problem   saba doesn't really help rec Dexilant 60 mg    Take  30-60 min before first meal of the day and Pepcid (famotidine)  20 mg one after supper  until return to office - this is the best way to tell whether stomach acid is contributing to your problem.   GERD diet  Plan A = Automatic = stop advair and start symbicort 160 Take 2 puffs first thing in am and then another 2 puffs about 12 hours later.  Work on inhaler technique:  Plan B = Backup Only use your albuterol inhaler as a rescue medication For cough try tessalon 200 mg every 6 hours as needed  No work until return in 2 weeks and we do a  Covid Study prior to your visit  >>>> neg  01/13/2019    05/25/2022  acute ov/Jhana Giarratano re: ? Astthma flare with uri   maint on nothing x one needing   Chief Complaint  Patient presents with   Acute Visit    Follow up for upper airyway/cough varient asthma. Pt states that the generic albuterol is not working well for her. She is currently on Symbicort and is  working well for her. She states that she has been coughing and wheezing a lot lately.   2 weeks prior to OV  onset of cough/ wheezing started back on symbicort 80 though the numbers don't add up as has only used 10 puffs  Dyspnea:  worse with coughing spells, no longer on dexilant  Cough: min white / using lots of mints Sleeping: doing fine now  SABA use: says generic alb not working  02: none  Covid status:   vax max/ infected maybe  twice  Rec When note coughing do the following: Pepcid 20 mg after bfast and supper  When can get dexilant  Take 30-60 min before first meal of the day  GERD diet reviewed, bed blocks rec  Best cough med delsym 2tsp or mucinex dm 1200  twice daily Plan A = Automatic = Always=    Symbicort 80 Take 2 puffs first thing in am and then another 2 puffs about 12 hours later- only taper if 100% better  Work on inhaler technique: Plan B = Backup (to supplement plan A, not to replace it) Only use your albuterol inhaler as a rescue medication     .07/06/2022  f/u ov/Alliana Mcauliff re:  uacs vs cough variant asthma   maint on symb 80 2bid   Chief Complaint  Patient presents with   Follow-up    Doing well.  Had CVA 06/22/2022   Dyspnea:  walking 15-20 min daily s sob/ stroke affected L arm and symptoms have resolved Cough: none Sleeping: fine flate SABA use: none  02: none      No obvious day to day or daytime variability or assoc excess/ purulent sputum or mucus plugs or hemoptysis or cp or chest tightness, subjective wheeze or overt sinus or hb symptoms.   Sleeping  without nocturnal  or early am exacerbation  of respiratory  c/o's or need for noct saba. Also denies any obvious fluctuation of symptoms with weather or environmental changes or other aggravating or alleviating factors except as outlined above   No unusual exposure hx or h/o childhood pna/ asthma or knowledge of premature birth.  Current Allergies, Complete Past Medical History, Past Surgical History,  Family History, and Social History were reviewed in [employer] record.  ROS  The following are not active complaints unless bolded Hoarseness, sore throat, dysphagia, dental problems, itching, sneezing,  nasal congestion or discharge of excess mucus or purulent secretions, ear ache,   fever, chills, sweats, unintended wt loss or wt gain, classically pleuritic or exertional cp,  orthopnea pnd or arm/hand swelling  or leg swelling, presyncope, palpitations, abdominal pain, anorexia, nausea, vomiting, diarrhea  or change in bowel habits or change in bladder habits, change in stools or change in urine, dysuria, hematuria,  rash, arthralgias, visual complaints, headache, numbness, weakness or ataxia or problems with walking or coordination,  change in mood or  memory.        Current Meds  Medication Sig   acetaminophen (TYLENOL) 500 MG tablet Take 1,000 mg by mouth every 6 (six) hours as needed for moderate pain.   albuterol (PROAIR HFA) 108 (90 Base) MCG/ACT inhaler USE 2 PUFFS EVERY 4 HOURS AS NEEDED FOR COUGH, WHEEZE OR SHORTNESS OF BREATH. (Patient taking differently: Inhale 2 puffs into the lungs every 4 (four) hours as needed for wheezing or shortness of breath.)   albuterol (PROVENTIL HFA) 108 (90 Base) MCG/ACT inhaler Inhale 2 puffs into the lungs every 6 (six) hours as needed for wheezing or shortness of breath.   albuterol (VENTOLIN HFA) 108 (90 Base) MCG/ACT inhaler Inhale 2 puffs into the lungs every 4 (four) hours as needed for wheezing or shortness of breath.   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   Blood Glucose Monitoring Suppl (FREESTYLE FREEDOM) KIT 1 Device by Does not apply route daily.   budesonide-formoterol (SYMBICORT) 80-4.5 MCG/ACT inhaler Inhale 2 puffs into the lungs 2 (two) times daily, in the morning and 12 hours later.   clopidogrel (PLAVIX) 75 MG tablet Take 1 tablet (75 mg total) by mouth daily.   dexlansoprazole (DEXILANT) 60 MG capsule Take 1 capsule (60 mg total) by mouth daily.   diclofenac sodium (VOLTAREN) 1 % GEL Apply 2 g topically 4 (four) times daily. Rub into affected area of foot 2 to 4 times daily (Patient taking  differently: Apply 2 g topically 4 (four) times daily as needed (pain). Rub into affected area of foot 2 to 4 times daily)   famotidine (PEPCID) 20 MG tablet One after supper (Patient taking differently: Take 20 mg by mouth daily.)   fluticasone (FLONASE) 50 MCG/ACT nasal spray USE 2 SPRAYS INTO BOTH NOSTRILS DAILY. (Patient taking differently: Place 1 spray into both nostrils daily.)   glucose blood (FREESTYLE LITE) test strip USE TO CHECK BLOOD SUGAR ONCE A DAY   levocetirizine (XYZAL) 5 MG tablet TAKE 1 TABLET BY MOUTH EVERY EVENING. (Patient taking differently: Take 5 mg by mouth every evening.)   lidocaine (LIDODERM) 5 % Apply to the affected area once daily as needed for a week. **Remove patch after 12 hours**   methimazole (TAPAZOLE) 5 MG tablet Take 1 tablet (5 mg total) by mouth 3 (three) times daily.   Probiotic Product (ALIGN) 4 MG CAPS Take 1 capsule (4 mg total) by mouth daily.   sucralfate (CARAFATE) 1 g tablet Take 1 tablet by mouth every 6 hours as needed. (Patient taking differently: Take 1 g by mouth every 6 (six) hours as needed (stomach issues).)   valACYclovir (VALTREX) 1000 MG tablet Take 1 tablet (1,000 mg total) by mouth 3 (three) times daily as needed. (Patient taking differently: Take 1,000 mg by mouth 3 (three) times daily as needed (fever blisters).)                   Past Medical History:  Diagnosis Date    Allergy    Anxiety    Asthma    pt has inhaler   Back pain    C. difficile colitis    Cataract    bil eyes   Chest pain    Chronic fatigue syndrome    Clostridium difficile colitis 12/13/2016   Diverticulitis    Dyspnea    Dysrhythmia    palpitations   Edema    Elevated blood pressure reading without diagnosis of hypertension    "just elevated when I'm in pain" (12/07/2015)   Fatty liver    Fibromyalgia    Ganglion of joint    right wrist   GERD (gastroesophageal reflux disease)    H. pylori infection    Headache    hx of   Helicobacter pylori (H. pylori) infection 12/13/2016   Herpes zoster without mention of complication    HTN (hypertension)    Hyperlipidemia    Hyperthyroidism    IBS (irritable bowel syndrome)    Leg edema    Multiple drug allergies 06/18/2017   Myalgia    Nontraumatic rupture of Achilles tendon    Other acute reactions to stress    Pain in joint, shoulder region    Pain in joint, upper arm    Pain in limb    Palpitations    Personal history of other diseases of digestive system    Pneumonia    once   PONV (postoperative nausea and vomiting)    Routine screening for STI (sexually transmitted infection) 12/13/2016   S/P partial colectomy 06/18/2017   Thyroid disease    hyperthyroidism   Type 2 diabetes mellitus with hyperglycemia, without long-term current use of insulin (HCC) 08/07/2018   Vitamin B 12 deficiency          Objective:     Wts   07/06/2022      230 05/25/2022        230   09/25/2019  233   01/15/19 237 lb (107.5 kg)  01/01/19 235 lb (106.6 kg)  12/23/18 233 lb 9.6 oz (106 kg)     Vital signs reviewed  07/06/2022  - Note at rest 02 sats  100% on RA   General appearance:    amb mod obese bf nad   HEENT : Oropharynx  clear        NECK :  without  apparent JVD/ palpable Nodes/TM    LUNGS: no acc muscle use,  Nl contour chest which is clear to A and P bilaterally without cough on insp or exp  maneuvers   CV:  RRR  no s3 or murmur or increase in P2, and no edema   ABD: obese  soft and nontender    MS:  Nl gait/ ext warm without deformities Or obvious joint restrictions  calf tenderness, cyanosis or clubbing    SKIN: warm and dry without lesions    NEURO:  alert, approp, nl sensorium with  no motor or cerebellar deficits apparent.          Assessment

## 2022-07-06 NOTE — Patient Instructions (Addendum)
No change in recommendations   Please schedule a follow up visit in 12 months but call sooner if needed

## 2022-07-06 NOTE — Assessment & Plan Note (Signed)
Flared early April 2020 while on Advair 250 - d/c 01/01/2019  - 01/15/2019  After extensive coaching inhaler device,  effectiveness =    50% and doing better on symb 160 1 bid so try symb 80 2bid > sustained improvement and nl pfts - 05/25/2022  After extensive coaching inhaler device,  effectiveness =    50% (short Ti) so rec resume symb 80 2bid prn and rx cough with gerd rx/ diet/ avoid mints > complete resolution 07/06/2022 > change symbicort 80 to prn   All goals of chronic asthma control met including optimal function and elimination of symptoms with minimal need for rescue therapy.  Contingencies discussed in full including contacting this office immediately if not controlling the symptoms using the rule of two's.     Ok to titrate symbicort down Based on two studies from [place]; 20 p 1865 (2018) and 380 : p2020-30 (2019) in pts with mild asthma it is reasonable to use low dose symbicort eg 80 2bid "prn" flare in this setting but I emphasized this was only shown with symbicort and takes advantage of the rapid onset of action but is not the same as "rescue therapy" but can be stopped once the acute symptoms have resolved and the need for rescue has been minimized (< 2 x weekly)   - if does have flare of symptoms should take the full dose until symptoms gone for a week to take advantage of both components of symbicort   Pulmonary f/u can be q 12 m, sooner prn          Each maintenance medication was reviewed in detail including emphasizing most importantly the difference between maintenance and prns and under what circumstances the prns are to be triggered using an action plan format where appropriate.  Total time for H and P, chart review, counseling, reviewing hfa device(s) and generating customized AVS unique to this office visit / same day charting  > 30 min summary f/u ov

## 2022-07-06 NOTE — Assessment & Plan Note (Signed)
PFT's 09/25/2019 wnl except for ERV 31% c/w body habitus  Body mass index is 38.27 kg/m.  -   Lab Results  Component Value Date   TSH 0.46 06/15/2022      Contributing to doe   >>>   reviewed the need and the process to achieve and maintain neg calorie balance > defer f/u primary care including intermittently monitoring thyroid status     >>>Now also has gerd and CVA as complications > lifestyle changes in progress and f/u per PCP

## 2022-07-08 NOTE — Anesthesia Preprocedure Evaluation (Signed)
Anesthesia Evaluation  Patient identified by MRN, date of birth, ID band Patient awake    Reviewed: Allergy & Precautions, NPO status , Patient's Chart, lab work & pertinent test results  History of Anesthesia Complications Negative for: history of anesthetic complications  Airway Mallampati: II  TM Distance: >3 FB Neck ROM: Full    Dental  (+) Teeth Intact   Pulmonary asthma    Pulmonary exam normal        Cardiovascular hypertension, Normal cardiovascular exam   Echo 06/23/22: EF 60-65%, no RWMA, mild LVH, normal RVSF, trivial MR, trivial AR   Neuro/Psych CVA    GI/Hepatic Neg liver ROS,GERD  ,,  Endo/Other  diabetes, Type 2 Hyperthyroidism (on methimazole)   Renal/GU negative Renal ROS  negative genitourinary   Musculoskeletal  (+)  Fibromyalgia -  Abdominal   Peds  Hematology   Anesthesia Other Findings   Reproductive/Obstetrics                             Anesthesia Physical Anesthesia Plan  ASA: 4  Anesthesia Plan: MAC   Post-op Pain Management: Minimal or no pain anticipated   Induction: Intravenous  PONV Risk Score and Plan: 2 and Propofol infusion, TIVA and Treatment may vary due to age or medical condition  Airway Management Planned: Natural Airway, Nasal Cannula and Simple Face Mask  Additional Equipment: None  Intra-op Plan:   Post-operative Plan:   Informed Consent: I have reviewed the patients History and Physical, chart, labs and discussed the procedure including the risks, benefits and alternatives for the proposed anesthesia with the patient or authorized representative who has indicated his/her understanding and acceptance.       Plan Discussed with:   Anesthesia Plan Comments:        Anesthesia Quick Evaluation

## 2022-07-09 ENCOUNTER — Ambulatory Visit (HOSPITAL_BASED_OUTPATIENT_CLINIC_OR_DEPARTMENT_OTHER): Payer: No Typology Code available for payment source | Admitting: Anesthesiology

## 2022-07-09 ENCOUNTER — Ambulatory Visit (HOSPITAL_COMMUNITY): Payer: No Typology Code available for payment source | Admitting: Anesthesiology

## 2022-07-09 ENCOUNTER — Encounter (HOSPITAL_COMMUNITY)
Admission: RE | Disposition: A | Discharge status: Home / Self Care | Payer: Self-pay | Source: Home / Self Care | Attending: Cardiology

## 2022-07-09 ENCOUNTER — Ambulatory Visit (HOSPITAL_COMMUNITY)
Admission: RE | Admit: 2022-07-09 | Discharge: 2022-07-09 | Disposition: A | Discharge status: Home / Self Care | Payer: No Typology Code available for payment source | Attending: Cardiology | Admitting: Cardiology

## 2022-07-09 ENCOUNTER — Encounter (HOSPITAL_COMMUNITY): Payer: Self-pay | Admitting: Cardiology

## 2022-07-09 ENCOUNTER — Other Ambulatory Visit: Payer: Self-pay

## 2022-07-09 ENCOUNTER — Ambulatory Visit (HOSPITAL_BASED_OUTPATIENT_CLINIC_OR_DEPARTMENT_OTHER)
Admission: RE | Admit: 2022-07-09 | Discharge: 2022-07-09 | Disposition: A | Discharge status: Home / Self Care | Payer: No Typology Code available for payment source | Source: Ambulatory Visit | Attending: Cardiology | Admitting: Cardiology

## 2022-07-09 DIAGNOSIS — E119 Type 2 diabetes mellitus without complications: Secondary | ICD-10-CM | POA: Insufficient documentation

## 2022-07-09 DIAGNOSIS — I639 Cerebral infarction, unspecified: Secondary | ICD-10-CM | POA: Diagnosis not present

## 2022-07-09 DIAGNOSIS — Z7902 Long term (current) use of antithrombotics/antiplatelets: Secondary | ICD-10-CM | POA: Diagnosis not present

## 2022-07-09 DIAGNOSIS — Q2112 Patent foramen ovale: Secondary | ICD-10-CM | POA: Insufficient documentation

## 2022-07-09 DIAGNOSIS — M797 Fibromyalgia: Secondary | ICD-10-CM | POA: Insufficient documentation

## 2022-07-09 DIAGNOSIS — I6340 Cerebral infarction due to embolism of unspecified cerebral artery: Secondary | ICD-10-CM

## 2022-07-09 DIAGNOSIS — I10 Essential (primary) hypertension: Secondary | ICD-10-CM | POA: Diagnosis not present

## 2022-07-09 DIAGNOSIS — I081 Rheumatic disorders of both mitral and tricuspid valves: Secondary | ICD-10-CM | POA: Diagnosis not present

## 2022-07-09 DIAGNOSIS — K219 Gastro-esophageal reflux disease without esophagitis: Secondary | ICD-10-CM | POA: Insufficient documentation

## 2022-07-09 DIAGNOSIS — I340 Nonrheumatic mitral (valve) insufficiency: Secondary | ICD-10-CM | POA: Diagnosis not present

## 2022-07-09 DIAGNOSIS — E0590 Thyrotoxicosis, unspecified without thyrotoxic crisis or storm: Secondary | ICD-10-CM | POA: Insufficient documentation

## 2022-07-09 DIAGNOSIS — J45909 Unspecified asthma, uncomplicated: Secondary | ICD-10-CM | POA: Diagnosis not present

## 2022-07-09 HISTORY — PX: TEE WITHOUT CARDIOVERSION: SHX5443

## 2022-07-09 HISTORY — PX: BUBBLE STUDY: SHX6837

## 2022-07-09 SURGERY — ECHOCARDIOGRAM, TRANSESOPHAGEAL
Anesthesia: Monitor Anesthesia Care

## 2022-07-09 MED ORDER — SODIUM CHLORIDE 0.9 % IV SOLN: INTRAVENOUS | Status: DC

## 2022-07-09 MED ORDER — LIDOCAINE 2% (20 MG/ML) 5 ML SYRINGE
INTRAMUSCULAR | Status: DC | PRN
  Administered 2022-07-09: 40 mg via INTRAVENOUS
  Administered 2022-07-09: 60 mg via INTRAVENOUS

## 2022-07-09 MED ORDER — PROPOFOL 500 MG/50ML IV EMUL
INTRAVENOUS | Status: DC | PRN
  Administered 2022-07-09: 75 ug/kg/min via INTRAVENOUS

## 2022-07-09 MED ORDER — PROPOFOL 10 MG/ML IV BOLUS
INTRAVENOUS | Status: DC | PRN
  Administered 2022-07-09: 20 mg via INTRAVENOUS
  Administered 2022-07-09: 30 mg via INTRAVENOUS
  Administered 2022-07-09 (×3): 15 mg via INTRAVENOUS

## 2022-07-09 NOTE — CV Procedure (Signed)
    TRANSESOPHAGEAL ECHOCARDIOGRAM   NAME:  Emily Osborne   MRN: [id number] DOB:  [birth year]   ADMIT DATE: 07/09/2022  INDICATIONS: Stroke  PROCEDURE:   Informed consent was obtained prior to the procedure. The risks, benefits and alternatives for the procedure were discussed and the patient comprehended these risks.  Risks include, but are not limited to, cough, sore throat, vomiting, nausea, somnolence, esophageal and stomach trauma or perforation, bleeding, low blood pressure, aspiration, pneumonia, infection, trauma to the teeth and death.    Procedural time out performed.   Patient received monitored anesthesia care under the supervision of Dr. Christella Hartigan. Patient received a total of 190 mg propofol during the procedure.  The transesophageal probe was inserted in the esophagus and stomach without difficulty and multiple views were obtained.    COMPLICATIONS:    There were no immediate complications.  FINDINGS:  LEFT VENTRICLE: EF = 55-60%. No regional wall motion abnormalities.  RIGHT VENTRICLE: Normal size and function.   LEFT ATRIUM: No thrombus/mass.  LEFT ATRIAL APPENDAGE: No thrombus/mass.   RIGHT ATRIUM: No thrombus/mass.  AORTIC VALVE:  Trileaflet. No significant regurgitation. No vegetation.  MITRAL VALVE:    Normal structure. Mild regurgitation. No vegetation.  TRICUSPID VALVE: Normal structure. Trivial regurgitation. No vegetation.  PULMONIC VALVE: Grossly normal structure. No regurgitation. No apparent vegetation.  INTERATRIAL SEPTUM: No PFO or ASD seen by color Doppler. Agitated saline contrast was rapidly positive for right to left shunt  PERICARDIUM: No effusion noted.  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

## 2022-07-09 NOTE — Anesthesia Postprocedure Evaluation (Signed)
Anesthesia Post Note  Patient: Emily Osborne  Procedure(s) Performed: TRANSESOPHAGEAL ECHOCARDIOGRAM (TEE) BUBBLE STUDY     Patient location during evaluation: Endoscopy Anesthesia Type: MAC Level of consciousness: awake and alert Pain management: pain level controlled Vital Signs Assessment: post-procedure vital signs reviewed and stable Respiratory status: spontaneous breathing, nonlabored ventilation and respiratory function stable Cardiovascular status: blood pressure returned to baseline and stable Postop Assessment: no apparent nausea or vomiting Anesthetic complications: no   No notable events documented.  Last Vitals:  Vitals:   07/09/22 0840 07/09/22 0845  BP: (!) 156/98   Pulse: 70 71  Resp: 16 18  Temp:    SpO2: 98% 98%    Last Pain:  Vitals:   07/09/22 0840  TempSrc:   PainSc: 0-No pain                 [name]

## 2022-07-09 NOTE — Interval H&P Note (Signed)
History and Physical Interval Note:  07/09/2022 7:54 AM  Emily Osborne  has presented today for surgery, with the diagnosis of STROKE.  The various methods of treatment have been discussed with the patient and family. After consideration of risks, benefits and other options for treatment, the patient has consented to  Procedure(s): TRANSESOPHAGEAL ECHOCARDIOGRAM (TEE) (N/A) as a surgical intervention.  The patient's history has been reviewed, patient examined, no change in status, stable for surgery.  I have reviewed the patient's chart and labs.  Questions were answered to the patient's satisfaction.     Doll Frazee Harrell Gave

## 2022-07-09 NOTE — Anesthesia Procedure Notes (Signed)
Procedure Name: MAC Date/Time: 07/09/2022 8:06 AM  Performed by: Janace Litten, CRNAPre-anesthesia Checklist: Patient identified, Emergency Drugs available, Suction available and Patient being monitored Patient Re-evaluated:Patient Re-evaluated prior to induction Oxygen Delivery Method: Nasal cannula

## 2022-07-09 NOTE — Transfer of Care (Signed)
Immediate Anesthesia Transfer of Care Note  Patient: Emily Osborne  Procedure(s) Performed: TRANSESOPHAGEAL ECHOCARDIOGRAM (TEE) BUBBLE STUDY  Patient Location: PACU and Endoscopy Unit  Anesthesia Type:MAC  Level of Consciousness: drowsy, patient cooperative, and responds to stimulation  Airway & Oxygen Therapy: Patient Spontanous Breathing  Post-op Assessment: Report given to RN and Post -op Vital signs reviewed and stable  Post vital signs: Reviewed and stable  Last Vitals:  Vitals Value Taken Time  BP    Temp    Pulse    Resp    SpO2      Last Pain:  Vitals:   07/09/22 0712  TempSrc: Temporal  PainSc: 0-No pain         Complications: No notable events documented.

## 2022-07-09 NOTE — Progress Notes (Signed)
  Echocardiogram Echocardiogram Transesophageal has been performed.  Emily Osborne 07/09/2022, 8:42 AM

## 2022-07-09 NOTE — Discharge Instructions (Signed)

## 2022-07-10 ENCOUNTER — Encounter (HOSPITAL_COMMUNITY): Payer: Self-pay | Admitting: Cardiology

## 2022-07-11 ENCOUNTER — Telehealth (HOSPITAL_BASED_OUTPATIENT_CLINIC_OR_DEPARTMENT_OTHER): Payer: Self-pay

## 2022-07-11 NOTE — Telephone Encounter (Signed)
-----   Message from Buford Dresser, MD sent at 07/11/2022  9:22 AM EST ----- Regarding: FW: PFO closure? Hey team--can you call Ms. Rachels and tell her to keep her appt with EP next week to discuss the loop recorder? Dr. Burt Knack looked at her study and thinks if she has a PFO it is very small. He recommends proceeding with loop, and if that shows no afib for 2-3 months, then we would refer her to discuss closer device. Thank you!  Sending to you Renee because you are due to see her next week :)  ----- Message ----- From: Sherren Mocha, MD Sent: 07/10/2022  11:19 AM EST To: Buford Dresser, MD Subject: RE: PFO closure?                               Hey Bridgette - that's a really good TEE study. I think you showed the aortic/anterior aspect of the fossa and SVC/superior aspect really well. I don't see a PFO in either view but agree that bubble cross early. I like to see the bubble go through the PFO tunnel but I couldn't confirm that way either. I think there is a high likelihood there is a small PFO. At her age and with the PFO being small, I would go ahead with loop implant, then refer for consideration of PFO closure. We typically would consider closure if the loop shows no AF over 2-3 months. If this was a moderate to large PFO with atrial septal aneurysm I would do up front closure but this seems small.   Thx - Coop ----- Message ----- From: Buford Dresser, MD Sent: 07/09/2022   8:31 AM EST To: Sherren Mocha, MD Subject: PFO closure?                                   Hi Mike. This patient was recently admitted for a CVA, supposed embolic, and set up for a loop as an outpatient. She didn't have a TEE while admitted, so when I saw her I set her up for one as an outpatient. Just did her TEE--no left to right shunt that I could see, but she has a right to left by bubble study. The pictures aren't great, sorry. She's set up for an EP visit to discuss loop next week, but I  think seeing you and talking about closure would make sense before she gets a loop. If you don't think closure is a good choice then we can talk about next steps when I see her.  Thanks! Bridgette

## 2022-07-15 NOTE — Progress Notes (Signed)
Cardiology Office Note Date:  07/17/2022  Patient ID:  Emily, Osborne 07/17/1961, MRN [id number] PCP:  Carollee Herter, Alferd Apa, DO  Cardiologist:  Dr. Harrell Gave Electrophysiologist: new to EP    Chief Complaint: cryptogenic stroke, discuss loop implant  History of Present Illness: Emily Osborne is a 61 y.o. female with history of fibromyalgia, chronic headaches, HTN, HLD, DM, B12 def, Graves dz  She developed headache, L sided weakness/numbness admitted 06/22/22 and found with stroke, scattered punctate ischemic infarcts bilaterally. TEE 06/23/2022 revealed LVEF 60-65%, mild LVH.  Discharged 06/23/22 with recommendations for outpt cardiology consult/consideration for TEE and long-term monitoring  TEE noted + bubble R>L, Dr. Harrell Gave reviewed with Dr. Burt Knack who felt if she had a PFO would be very small, recommended proceeding with EP eval +/- loop.  She continues to have HA about 2/week, BP elevated when she has headaches, but only checks BP when she has a HA. HA relieved with tylenol.   Over the summer 2023, she had a reoccurrence of her hyperthyroid. She was having palpitations, SOB, restlessness, poor sleep, fatigue. Saw endocrinology and put back on methimazole. Since being back on methimazole, has had resolution of symptoms. She has never been told she has AF/AFL. Except for her untreated hyperthyroid, she does not have palpitations or SOB.   She works as a Programme researcher, broadcasting/film/video at [employer].   Past Medical History:  Diagnosis Date   Allergy    Anxiety    Asthma    pt has inhaler   Back pain    C. difficile colitis    Cataract    bil eyes   Chest pain    Chronic fatigue syndrome    Clostridium difficile colitis 12/13/2016   Diverticulitis    Dyspnea    Dysrhythmia    palpitations   Edema    Elevated blood pressure reading without diagnosis of hypertension    "just elevated when I'm in pain" (12/07/2015)   Fatty liver    Fibromyalgia    Ganglion of  joint    right wrist   GERD (gastroesophageal reflux disease)    H. pylori infection    Headache    hx of   Helicobacter pylori (H. pylori) infection 12/13/2016   Herpes zoster without mention of complication    HTN (hypertension)    Hyperlipidemia    Hyperthyroidism    IBS (irritable bowel syndrome)    Leg edema    Multiple drug allergies 06/18/2017   Myalgia    Nontraumatic rupture of Achilles tendon    Other acute reactions to stress    Pain in joint, shoulder region    Pain in joint, upper arm    Pain in limb    Palpitations    Personal history of other diseases of digestive system    Pneumonia    once   PONV (postoperative nausea and vomiting)    Routine screening for STI (sexually transmitted infection) 12/13/2016   S/P partial colectomy 06/18/2017   Thyroid disease    hyperthyroidism   Type 2 diabetes mellitus with hyperglycemia, without long-term current use of insulin ([place]) 08/07/2018   Vitamin B 12 deficiency     Past Surgical History:  Procedure Laterality Date   ABDOMINAL HYSTERECTOMY     ACHILLES TENDON REPAIR Right 2005   BUBBLE STUDY  07/09/2022   Procedure: BUBBLE STUDY;  Surgeon: Buford Dresser, MD;  Location: [facility] ENDOSCOPY;  Service: Cardiovascular;;   BUNIONECTOMY Bilateral  [place]  01/23/2017   6 to 8 inches sigmoid colon removed   COLONOSCOPY     GANGLION CYST EXCISION Right    wrist   LEFT HEART CATH AND CORONARY ANGIOGRAPHY N/A 10/03/2016   Procedure: Left Heart Cath and Coronary Angiography;  Surgeon: Burnell Blanks, MD;  Location: [facility] CV LAB;  Service: Cardiovascular;  Laterality: N/A;   MENISCUS REPAIR Right 04/2014   POLYPECTOMY     TEE WITHOUT CARDIOVERSION N/A 07/09/2022   Procedure: TRANSESOPHAGEAL ECHOCARDIOGRAM (TEE);  Surgeon: Buford Dresser, MD;  Location: [facility] ENDOSCOPY;  Service: Cardiovascular;  Laterality: N/A;   TUBAL LIGATION     wisdomteeth extraction      Current  Outpatient Medications  Medication Sig Dispense Refill   acetaminophen (TYLENOL) 500 MG tablet Take 1,000 mg by mouth every 6 (six) hours as needed for moderate pain.     albuterol (VENTOLIN HFA) 108 (90 Base) MCG/ACT inhaler Inhale 2 puffs into the lungs every 4 (four) hours as needed for wheezing or shortness of breath. 18 g 2   ALPRAZolam (XANAX) 0.25 MG tablet TAKE 1 TABLET BY MOUTH THREE TIMES A DAY AS NEEDED (Patient taking differently: Take 0.25 mg by mouth 3 (three) times daily as needed for anxiety.) 30 tablet 2   atorvastatin (LIPITOR) 40 MG tablet Take 1 tablet (40 mg total) by mouth daily. 30 tablet 1   budesonide-formoterol (SYMBICORT) 80-4.5 MCG/ACT inhaler Inhale 2 puffs into the lungs 2 (two) times daily, in the morning and 12 hours later. 10.2 g 11   clopidogrel (PLAVIX) 75 MG tablet Take 1 tablet (75 mg total) by mouth daily. 30 tablet 2   dexlansoprazole (DEXILANT) 60 MG capsule Take 1 capsule (60 mg total) by mouth daily. 90 capsule 2   diclofenac sodium (VOLTAREN) 1 % GEL Apply 2 g topically 4 (four) times daily. Rub into affected area of foot 2 to 4 times daily (Patient taking differently: Apply 2 g topically 4 (four) times daily as needed (pain). Rub into affected area of foot 2 to 4 times daily) 100 g 2   famotidine (PEPCID) 20 MG tablet One after supper (Patient taking differently: Take 20 mg by mouth daily.) 30 tablet 11   fluticasone (FLONASE) 50 MCG/ACT nasal spray USE 2 SPRAYS INTO BOTH NOSTRILS DAILY. (Patient taking differently: Place 1 spray into both nostrils daily.) 16 g 5   levocetirizine (XYZAL) 5 MG tablet TAKE 1 TABLET BY MOUTH EVERY EVENING. (Patient taking differently: Take 5 mg by mouth every evening.) 90 tablet 3   lidocaine (LIDODERM) 5 % Apply to the affected area once daily as needed for a week. **Remove patch after 12 hours** 30 patch 0   methimazole (TAPAZOLE) 5 MG tablet Take 1 tablet (5 mg total) by mouth 3 (three) times daily. (Patient taking  differently: Take 5 mg by mouth daily.) 90 tablet 1   Probiotic Product (ALIGN) 4 MG CAPS Take 1 capsule (4 mg total) by mouth daily. 90 capsule 3   sucralfate (CARAFATE) 1 g tablet Take 1 tablet by mouth every 6 hours as needed. (Patient taking differently: Take 1 g by mouth every 6 (six) hours as needed (stomach issues).) 60 tablet 3   valACYclovir (VALTREX) 1000 MG tablet Take 1 tablet (1,000 mg total) by mouth 3 (three) times daily as needed. (Patient taking differently: Take 1,000 mg by mouth 3 (three) times daily as needed (fever blisters).) 30 tablet 2   Blood Glucose Monitoring Suppl (FREESTYLE  FREEDOM) KIT 1 Device by Does not apply route daily. 1 each 0   glucose blood (FREESTYLE LITE) test strip USE TO CHECK BLOOD SUGAR ONCE A DAY 100 each 1   No current facility-administered medications for this visit.    Allergies:   Aspirin, Gentamicin, Ibuprofen, Metronidazole, Nsaids, Doxycycline, Influenza a (h1n1) monoval vac, Loratadine, Metformin and related, Periactin [cyproheptadine], Ranitidine hcl, Sulfamethoxazole-trimethoprim, and Tramadol hcl   Social History:  The patient  reports that she has never smoked. She has never used smokeless tobacco. She reports that she does not drink alcohol and does not use drugs.   Family History:  The patient's family history includes Atrial fibrillation in her sister; Cancer in her brother; Coronary artery disease in her brother; Diabetes in her father; Heart disease in her father and mother; Hyperlipidemia in her father and mother; Hypertension in her brother, father, and mother; Kidney disease in her father; Leukemia in her brother; Lupus in her daughter; Obesity in her mother; Pernicious anemia in her sister; Thyroid disease in her mother and sister.  ROS:  Please see the history of present illness.    All other systems are reviewed and otherwise negative.   PHYSICAL EXAM:  VS:  BP (!) 138/90   Pulse 66   Ht _0  (1.651 m)   Wt 233 lb (105.7 kg)    BMI 38.77 kg/m  BMI: Body mass index is 38.77 kg/m. Well nourished, well developed, in no acute distress HEENT: normocephalic, atraumatic Neck: no JVD, carotid bruits or masses Cardiac:  RRR; no significant murmurs, no rubs, or gallops Lungs:  CTA b/l, no wheezing, rhonchi or rales Abd: soft, nontender MS: no deformity or atrophy Ext: trace edema BLL Skin: warm and dry, no rash Neuro:  No gross deficits appreciated Psych: euthymic mood, full affect    EKG:  not done today All EKGs in chart reviewed, no AF appreciated  07/09/2022: TEE LEFT VENTRICLE: EF = 55-60%. No regional wall motion abnormalities RIGHT VENTRICLE: Normal size and function.  LEFT ATRIUM: No thrombus/mass. LEFT ATRIAL APPENDAGE: No thrombus/mass.  RIGHT ATRIUM: No thrombus/mass. AORTIC VALVE:  Trileaflet. No significant regurgitation. No vegetation. MITRAL VALVE:    Normal structure. Mild regurgitation. No vegetation. TRICUSPID VALVE: Normal structure. Trivial regurgitation. No vegetation. PULMONIC VALVE: Grossly normal structure. No regurgitation. No apparent vegetation. INTERATRIAL SEPTUM: No PFO or ASD seen by color Doppler. Agitated saline contrast was rapidly positive for right to left shunt PERICARDIUM: No effusion noted. 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 Left ventricular ejection fraction, by estimation, is 60 to 65%. The  left ventricle has normal function. The left ventricle has no regional  wall motion abnormalities. There is mild left ventricular hypertrophy.  Left ventricular diastolic parameters were normal.   2. Right ventricular systolic function is normal. The right ventricular  size is normal. There is normal pulmonary artery systolic pressure. The estimated right ventricular systolic pressure is 27.7 mmHg.   3. The mitral valve is normal in structure. Trivial mitral valve  regurgitation. No  evidence of mitral stenosis.   4. The aortic valve is tricuspid. Aortic valve regurgitation is trivial.  No aortic stenosis is present.   5. The inferior vena cava is normal in size with greater than 50%  respiratory variability, suggesting right atrial pressure of 3 mmHg.    CTA Head/Neck  06/22/2022: IMPRESSION: 1. Negative CTA of the head and neck. No large vessel  occlusion or other emergent finding. No hemodynamically significant or correctable stenosis. 2. Diffuse tortuosity of the major arterial vasculature of the head and neck, suggesting chronic underlying hypertension.   Monitor 09/2018: ~14 days of data recorded on Zio monitor. No VT, atrial fibrillation, high degree block, or pauses noted. Isolated atrial and ventricular ectopy was rare (<1%). There were 8 patient triggered events, all of which were normal sinus rhythm. Patient had a min HR of 49 bpm, max HR of 154 bpm, and avg HR of 73 bpm. Predominant underlying rhythm was Sinus Rhythm. 1 run of Supraventricular Tachycardia occurred lasting 5 beats with a max rate of 154 bpm (avg 116 bpm).  This occurred on 09-15-18 at 12:42 AM. Rate is irregular but brief.    Left Heart Cath  10/03/2016: The left ventricular systolic function is normal. LV end diastolic pressure is normal. The left ventricular ejection fraction is greater than 65% by visual estimate. There is no mitral valve regurgitation.   1. No angiographic evidence of CAD 2. Normal LV systolic function 3. Normal LVEDP     Recent Labs: 06/15/2022: TSH 0.46 06/22/2022: ALT 21 07/04/2022: BUN 12; Creatinine, Ser 0.95; Hemoglobin 13.8; Platelets 247; Potassium 4.8; Sodium 143  06/23/2022: Cholesterol 163; HDL 50; LDL Cholesterol 95; Total CHOL/HDL Ratio 3.3; Triglycerides 89; VLDL 18   Estimated Creatinine Clearance: 74.2 mL/min (by C-G formula based on SCr of 0.95 mg/dL).   Wt Readings from Last 3 Encounters:  07/17/22 233 lb (105.7 kg)  07/09/22 230 lb (104.3 kg)   07/06/22 230 lb (104.3 kg)     Other studies reviewed: Additional studies/records reviewed today include: summarized above  ASSESSMENT AND PLAN:  Cryptogenic Stroke Poss PFO No mention of AF on EKGs in chart No h/o AF ILR procedure explained in detail today, patient is very clear in her desire to proceed. Risks/benefits/alternatives discussed She is OK to meet Dr. Myles Gip at time of her procedure.  She is aware and agreeable to a monthly fee for ILR monitoring Plan to proceed with loop, if no AFib in 2-3 months to look into possible PFO closure.   3. Elevated BP She states her BP is elevated at times at home, but she only checks it when she has a HA. Unsure if HA causes HTN, or HTN causes HA. Recommended she take BP a couple times a day when feeling well to determine true BP  Disposition: F/u with Dr. Myles Gip for ILR  Current medicines are reviewed at length with the patient today.  The patient did not have any concerns regarding medicines.  SignedMamie Levers, NP 07/17/2022 12:57 PM     [address] [place] [address] [phone] (office)  [phone] (fax)

## 2022-07-17 ENCOUNTER — Ambulatory Visit: Payer: No Typology Code available for payment source | Attending: Physician Assistant | Admitting: Cardiology

## 2022-07-17 ENCOUNTER — Encounter: Payer: Self-pay | Admitting: Physician Assistant

## 2022-07-17 VITALS — BP 138/90 | HR 66 | Ht 65.0 in | Wt 233.0 lb

## 2022-07-17 DIAGNOSIS — I639 Cerebral infarction, unspecified: Secondary | ICD-10-CM

## 2022-07-17 DIAGNOSIS — R030 Elevated blood-pressure reading, without diagnosis of hypertension: Secondary | ICD-10-CM | POA: Diagnosis not present

## 2022-07-17 NOTE — Patient Instructions (Signed)
Medication Instructions:  Your physician recommends that you continue on your current medications as directed. Please refer to the Current Medication list given to you today.  *If you need a refill on your cardiac medications before your next appointment, please call your pharmacy*   Lab Work: None ordered   Testing/Procedures: None ordered   Follow-Up: At [facility], you and your health needs are our priority.  As part of our continuing mission to provide you with exceptional heart care, we have created designated Provider Care Teams.  These Care Teams include your primary Cardiologist (physician) and Advanced Practice Providers (APPs -  Physician Assistants and Nurse Practitioners) who all work together to provide you with the care you need, when you need it.  Your next appointment:   1 month(s)  The format for your next appointment:   In Person  Provider:   Doralee Albino, MD  for a loop recorder implant   Thank you for choosing [facility]!!   [phone]  Other Instructions   Implantable Loop Recorder Placement  An implantable loop recorder is a small electronic device that is placed under the skin of the chest. The device records the electrical activity of the heart over a long period of time. Your health care provider can download these recordings to monitor your heart. You may need an implantable loop recorder if you have periods of abnormal heart activity (arrhythmias) or unexplained fainting (syncope). The recorder can be left in place for as long as 3 years. Tell a health care provider about: Any allergies you have. All medicines you are taking, including vitamins, herbs, eye drops, creams, and over-the-counter medicines. Any problems you or family members have had with anesthesia. Any bleeding problems you have. Any surgeries you have had. Any medical conditions you have. Whether you are pregnant or may be pregnant. What are the risks? Your  health care provider will talk with you about risks. These may include: Infection. Bleeding. Allergic reactions to anesthesia. Damage to nerves or blood vessels. Failure of the device to work. This could require another surgery to replace it. What happens before the procedure? You may have a physical exam, blood tests, and imaging tests, such as a chest X-ray. Follow instructions from your health care provider about what you may eat and drink Ask your health care provider about: Changing or stopping your regular medicines. These include any diabetes medicines or blood thinners you take. Taking medicines such as aspirin and ibuprofen. These medicines can thin your blood. Do not take them unless your health care provider tells you to. Taking over-the-counter medicines, vitamins, herbs, and supplements. Ask your health care provider: How your surgery site will be marked. What steps will be taken to help prevent infection. These steps may include: Removing hair at the surgery site. Washing skin with a soap that kills germs. Taking antibiotics. If you will be going home right after the procedure, plan to have a responsible adult: Take you home from the hospital or clinic. You will not be allowed to drive. Do not use any products that contain nicotine or tobacco before the procedure. These products include cigarettes, chewing tobacco, and vaping devices, such as e-cigarettes. If you need help quitting, ask your health care provider What happens during the procedure? An IV will be inserted into one of your veins. You may be given: A sedative. This helps you relax Anesthesia. This will: Numb certain areas of your body. Make you fall asleep for surgery. A small incision will  be made on the left side of your upper chest. A pocket will be created under your skin. The device will be placed in the pocket. The incision will be closed with stitches (sutures) or adhesive strips. A bandage (dressing)  will be placed over the incision. The procedure may vary among health care providers and hospitals. What happens after the procedure? Your blood pressure, heart rate, breathing rate, and blood oxygen level will be monitored until you leave the hospital or clinic. You may be able to go home on the day of your surgery. Before you go home: Your health care provider will program your recorder. You will learn how to trigger your device with a handheld activator. You will learn how to send recordings to your health care provider. You will get an ID card for your device, and you will be told when to use it. This information is not intended to replace advice given to you by your health care provider. Make sure you discuss any questions you have with your health care provider. Document Revised: 01/01/2022 Document Reviewed: 01/01/2022 Elsevier Patient Education  [place]

## 2022-07-20 HISTORY — PX: LOOP RECORDER IMPLANT: SHX5954

## 2022-07-23 ENCOUNTER — Other Ambulatory Visit (HOSPITAL_COMMUNITY): Payer: Self-pay

## 2022-07-24 ENCOUNTER — Other Ambulatory Visit (INDEPENDENT_AMBULATORY_CARE_PROVIDER_SITE_OTHER): Payer: No Typology Code available for payment source

## 2022-07-24 DIAGNOSIS — E0500 Thyrotoxicosis with diffuse goiter without thyrotoxic crisis or storm: Secondary | ICD-10-CM | POA: Diagnosis not present

## 2022-07-24 LAB — T4, FREE: Free T4: 0.69 ng/dL (ref 0.60–1.60)

## 2022-07-24 LAB — TSH: TSH: 1.09 u[IU]/mL (ref 0.35–5.50)

## 2022-07-24 LAB — T3, FREE: T3, Free: 3.4 pg/mL (ref 2.3–4.2)

## 2022-07-25 ENCOUNTER — Ambulatory Visit: Payer: No Typology Code available for payment source | Admitting: Neurology

## 2022-07-25 ENCOUNTER — Encounter: Payer: Self-pay | Admitting: Neurology

## 2022-07-25 VITALS — BP 133/88 | HR 61 | Resp 18 | Ht 65.0 in | Wt 234.0 lb

## 2022-07-25 DIAGNOSIS — I639 Cerebral infarction, unspecified: Secondary | ICD-10-CM

## 2022-07-25 DIAGNOSIS — G44209 Tension-type headache, unspecified, not intractable: Secondary | ICD-10-CM

## 2022-07-25 NOTE — Progress Notes (Signed)
Follow-up Visit   Date: 07/25/2022    Emily Osborne MRN: [id number] DOB: December 03, 1960    Emily Osborne is a 61 y.o. right-handed African American female with fibromyalgia, chronic headaches, hypertension, hyperlipidemia, vitamin B12 deficiency, and Graves disease returning to the clinic for hospital discharge follow-up for stroke.  The patient was accompanied to the clinic by self.   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 No LVO.  TEE with positive R > L shunt concerning for small PFO.   - Appreciate cardiology following, agree with loop recorder implantation  - Continue plavix 75mg  daily  - Continue secondary risk factor modification.  Continue lipitor 40mg  (LDL 95) and BP management.  BP elevated initially (159/110) and upon recheck improved (133/88).  I have asked her to continue to follow BP at home and share with PCP/cardiologist  - Stroke warning signs discussed  2.  Tension headaches, occurring 1-2 times per month - OK to treat as needed with tylenol  Return to clinic in 4 months   --------------------------------------------- UPDATE 07/25/2022: She was admitted on 06/22/2022 with stroke after presenting with left sided weakness/numbness and headache.  MRI brain shows scattered punctate ischemic infarcts bilaterally.  TEE showed normal EF, mild LVH.  CTA head and neck did not show large vessel occlusion or stenosis, there was note of diffuse tortuosity. TEE noted + bubble with R>L shunt.   She will be having loop recorder implanted later this month and if there is no atrial fibrillation present on monitoring, the next step would be PFO closure. She denies any ongoing weakness and feels back to her baseline.  She continues to have headaches about 1-2 times per month over the top of her head.  She typically treats this with tylenol and it resolved within a few minutes.    She is a nonsmoker.  No history of diabetes.  She takes meformin  for weight loss.    Medications:  Current Outpatient Medications on File Prior to Visit  Medication Sig Dispense Refill   acetaminophen (TYLENOL) 500 MG tablet Take 1,000 mg by mouth every 6 (six) hours as needed for moderate pain.     albuterol (VENTOLIN HFA) 108 (90 Base) MCG/ACT inhaler Inhale 2 puffs into the lungs every 4 (four) hours as needed for wheezing or shortness of breath. 18 g 2   ALPRAZolam (XANAX) 0.25 MG tablet TAKE 1 TABLET BY MOUTH THREE TIMES A DAY AS NEEDED (Patient taking differently: Take 0.25 mg by mouth 3 (three) times daily as needed for anxiety.) 30 tablet 2   atorvastatin (LIPITOR) 40 MG tablet Take 1 tablet (40 mg total) by mouth daily. 30 tablet 1   Blood Glucose Monitoring Suppl (FREESTYLE FREEDOM) KIT 1 Device by Does not apply route daily. 1 each 0   budesonide-formoterol (SYMBICORT) 80-4.5 MCG/ACT inhaler Inhale 2 puffs into the lungs 2 (two) times daily, in the morning and 12 hours later. 10.2 g 11   clopidogrel (PLAVIX) 75 MG tablet Take 1 tablet (75 mg total) by mouth daily. 30 tablet 2   dexlansoprazole (DEXILANT) 60 MG capsule Take 1 capsule (60 mg total) by mouth daily. 90 capsule 2   diclofenac sodium (VOLTAREN) 1 % GEL Apply 2 g topically 4 (four) times daily. Rub into affected area of foot 2 to 4 times daily (Patient taking differently: Apply 2 g topically 4 (four) times daily as needed (pain). Rub into affected area of foot 2 to 4 times daily) 100 g 2  famotidine (PEPCID) 20 MG tablet One after supper (Patient taking differently: Take 20 mg by mouth daily.) 30 tablet 11   fluticasone (FLONASE) 50 MCG/ACT nasal spray USE 2 SPRAYS INTO BOTH NOSTRILS DAILY. (Patient taking differently: Place 1 spray into both nostrils daily.) 16 g 5   glucose blood (FREESTYLE LITE) test strip USE TO CHECK BLOOD SUGAR ONCE A DAY 100 each 1   levocetirizine (XYZAL) 5 MG tablet TAKE 1 TABLET BY MOUTH EVERY EVENING. (Patient taking differently: Take 5 mg by mouth every evening.)  90 tablet 3   lidocaine (LIDODERM) 5 % Apply to the affected area once daily as needed for a week. **Remove patch after 12 hours** 30 patch 0   methimazole (TAPAZOLE) 5 MG tablet Take 1 tablet (5 mg total) by mouth 3 (three) times daily. (Patient taking differently: Take 5 mg by mouth daily.) 90 tablet 1   Probiotic Product (ALIGN) 4 MG CAPS Take 1 capsule (4 mg total) by mouth daily. 90 capsule 3   sucralfate (CARAFATE) 1 g tablet Take 1 tablet by mouth every 6 hours as needed. (Patient taking differently: Take 1 g by mouth every 6 (six) hours as needed (stomach issues).) 60 tablet 3   valACYclovir (VALTREX) 1000 MG tablet Take 1 tablet (1,000 mg total) by mouth 3 (three) times daily as needed. (Patient taking differently: Take 1,000 mg by mouth 3 (three) times daily as needed (fever blisters).) 30 tablet 2   No current facility-administered medications on file prior to visit.    Allergies:  Allergies  Allergen Reactions   Aspirin Other (See Comments)    REACTION: anaphylaxis   Gentamicin Other (See Comments)    Eye drops turned the sclera bright red   Ibuprofen Other (See Comments)    REACTION: anaphylaxsis   Metronidazole Swelling    REACTION: red face/swelling   Nsaids Other (See Comments)    REACTION: anaphylaxis   Doxycycline Other (See Comments)    REACTION: severe nausea/vomiting   Influenza A (H1n1) Monoval Vac Other (See Comments)    REACTION: sick for 3 weeks   Loratadine Other (See Comments)    Fatigue/weakness   Metformin And Related Other (See Comments)    Memory issues and headache   Periactin [Cyproheptadine] Other (See Comments)    paranoid    Ranitidine Hcl Other (See Comments)    REACTION: Lips turned red /peel   Sulfamethoxazole-Trimethoprim Other (See Comments)    REACTION: face red/peel   Tramadol Hcl Other (See Comments)    REACTION: paranoid    Vital Signs:  BP 133/88   Pulse 61   Resp 18   Ht 5\' 5"  (1.651 m)   Wt 234 lb (106.1 kg)   SpO2 95%    BMI 38.94 kg/m   Neurological Exam: MENTAL STATUS including orientation to time, place, person, recent and remote memory, attention span and concentration, language, and fund of knowledge is normal.  Speech is not dysarthric.  CRANIAL NERVES:  No visual field defects.  Pupils equal round and reactive to light.  Normal conjugate, extra-ocular eye movements in all directions of gaze.  No ptosis.  Face is symmetric. Palate elevates symmetrically.  Tongue is midline.  MOTOR:  Motor strength is 5/5 in all extremities.  No atrophy, fasciculations or abnormal movements.  No pronator drift.  Tone is normal.    MSRs:  Reflexes are 2+/4 throughout.  SENSORY:  Intact to vibration throughout.  COORDINATION/GAIT:  Normal finger-to- nose-finger.  Intact rapid alternating movements bilaterally.  Gait narrow based and stable.   Data: Lab Results  Component Value Date   HGBA1C 5.6 06/23/2022   Lab Results  Component Value Date   CHOL 163 06/23/2022   HDL 50 06/23/2022   LDLCALC 95 06/23/2022   LDLDIRECT 139.4 12/26/2011   TRIG 89 06/23/2022   CHOLHDL 3.3 06/23/2022    MRI brain wo contrast 06/22/2022: 1. Few punctate acute ischemic infarcts involving the cortical gray matter of both cerebral hemispheres, presumably embolic. No associated hemorrhage or mass effect. 2. Underlying mild chronic microvascular ischemic disease, stable.  CTA head and neck 06/22/2022:  Negative CTA of the head and neck. No large vessel occlusion or other emergent finding. No hemodynamically significant or correctable stenosis. 2. Diffuse tortuosity of the major arterial vasculature of the head and neck, suggesting chronic underlying hypertension.   TEE 07/09/2022: 1. Left ventricular ejection fraction, by estimation, is 55 to 60%. The  left ventricle has normal function. The left ventricle has no regional  wall motion abnormalities.   2. Right ventricular systolic function is normal. The right ventricular  size  is normal.   3. No left atrial/left atrial appendage thrombus was detected. The LAA  emptying velocity was 79 cm/s.   4. The mitral valve is normal in structure. Mild mitral valve  regurgitation. No evidence of mitral stenosis.   5. The aortic valve is tricuspid. Aortic valve regurgitation is not  visualized. No aortic stenosis is present.   6. Agitated saline contrast bubble study was positive with shunting  observed within 3-6 cardiac cycles suggestive of interatrial shunt. There  is a small patent foramen ovale with predominantly right to left shunting  across the atrial septum.   Conclusion(s)/Recommendation(s): Findings are concerning for an  interatrial shunt as detailed above.   Total time spent reviewing records, interview, history/exam, documentation, and coordination of care on day of encounter:  30 min   Thank you for allowing me to participate in patient's care.  If I can answer any additional questions, I would be pleased to do so.    Sincerely,    Giannamarie Paulus K. Allena Katz, DO

## 2022-07-25 NOTE — Patient Instructions (Signed)
Continue plavix '75mg'$   Return to clinic in 4 months

## 2022-07-27 ENCOUNTER — Other Ambulatory Visit (HOSPITAL_COMMUNITY): Payer: Self-pay

## 2022-08-06 NOTE — Progress Notes (Unsigned)
Electrophysiology Office Note:    Date:  08/06/2022   ID:  Emily Osborne, Emily Osborne [birth year], MRN [id number]  PCP:  Zola Button, Grayling Congress, DO   [facility] Providers Cardiologist:  Jodelle Red, MD { Click to update primary MD,subspecialty MD or APP then REFRESH:1}    Referring MD: Zola Button, Grayling Congress, *   History of Present Illness:    Emily Osborne is a 61 y.o. female with a hx listed below, significant for stroke thought to be of embolic origin, referred for ILR placement.  She was admitted with stroke in 06/2022 and noted to have multiple bilateral punctate infarcts. TEE did not show a significant PFO. A 14 day Zio patch did not show any atrial fibrillation. She is being referred for placement of an implantable loop recorder.   Past Medical History:  Diagnosis Date   Allergy    Anxiety    Asthma    pt has inhaler   Back pain    C. difficile colitis    Cataract    bil eyes   Chest pain    Chronic fatigue syndrome    Clostridium difficile colitis 12/13/2016   Diverticulitis    Dyspnea    Dysrhythmia    palpitations   Edema    Elevated blood pressure reading without diagnosis of hypertension    "just elevated when I'm in pain" (12/07/2015)   Fatty liver    Fibromyalgia    Ganglion of joint    right wrist   GERD (gastroesophageal reflux disease)    H. pylori infection    Headache    hx of   Helicobacter pylori (H. pylori) infection 12/13/2016   Herpes zoster without mention of complication    HTN (hypertension)    Hyperlipidemia    Hyperthyroidism    IBS (irritable bowel syndrome)    Leg edema    Multiple drug allergies 06/18/2017   Myalgia    Nontraumatic rupture of Achilles tendon    Other acute reactions to stress    Pain in joint, shoulder region    Pain in joint, upper arm    Pain in limb    Palpitations    Personal history of other diseases of digestive system    Pneumonia    once   PONV (postoperative nausea and vomiting)     Routine screening for STI (sexually transmitted infection) 12/13/2016   S/P partial colectomy 06/18/2017   Thyroid disease    hyperthyroidism   Type 2 diabetes mellitus with hyperglycemia, without long-term current use of insulin (HCC) 08/07/2018   Vitamin B 12 deficiency     Past Surgical History:  Procedure Laterality Date   ABDOMINAL HYSTERECTOMY     ACHILLES TENDON REPAIR Right 2005   BUBBLE STUDY  07/09/2022   Procedure: BUBBLE STUDY;  Surgeon: Jodelle Red, MD;  Location: [facility] ENDOSCOPY;  Service: Cardiovascular;;   BUNIONECTOMY Bilateral    Bunionectomy 1983   COLON SURGERY  01/23/2017   6 to 8 inches sigmoid colon removed   COLONOSCOPY     GANGLION CYST EXCISION Right    wrist   LEFT HEART CATH AND CORONARY ANGIOGRAPHY N/A 10/03/2016   Procedure: Left Heart Cath and Coronary Angiography;  Surgeon: Kathleene Hazel, MD;  Location[facility] INVASIVE CV LAB;  Service: Cardiovascular;  Laterality: N/A;   MENISCUS REPAIR Right 04/2014   POLYPECTOMY     TEE WITHOUT CARDIOVERSION N/A 07/09/2022   Procedure: TRANSESOPHAGEAL ECHOCARDIOGRAM (TEE);  Surgeon: Jodelle Red, MD;  Location: [facility] ENDOSCOPY;  Service: Cardiovascular;  Laterality: N/A;   TUBAL LIGATION     wisdomteeth extraction      Current Medications: No outpatient medications have been marked as taking for the 08/07/22 encounter (Appointment) with Wister Hoefle, Roberts Gaudy, MD.     Allergies:   Aspirin, Gentamicin, Ibuprofen, Metronidazole, Nsaids, Doxycycline, Influenza a (h1n1) monoval vac, Loratadine, Metformin and related, Periactin [cyproheptadine], Ranitidine hcl, Sulfamethoxazole-trimethoprim, and Tramadol hcl   Social History   Socioeconomic History   Marital status: Divorced    Spouse name: Not on file   Number of children: 2   Years of education: Not on file   Highest education level: Not on file  Occupational History   Occupation: Contractor  Tobacco Use   Smoking status: Never    Smokeless tobacco: Never  Vaping Use   Vaping Use: Never used  Substance and Sexual Activity   Alcohol use: No    Alcohol/week: 0.0 standard drinks of alcohol   Drug use: No   Sexual activity: Not Currently    Partners: Male  Other Topics Concern   Not on file  Social History Narrative   Lives with alone.  She has two daughter.   She works as a Physiological scientist at [employer]--- no   Right Handed   Social Determinants of Corporate investment banker Strain: Not on file  Food Insecurity: Not on file  Transportation Needs: Not on file  Physical Activity: Not on file  Stress: Not on file  Social Connections: Not on file     Family History: The patient's family history includes Atrial fibrillation in her sister; Cancer in her brother; Coronary artery disease in her brother; Diabetes in her father; Heart disease in her father and mother; Hyperlipidemia in her father and mother; Hypertension in her brother, father, and mother; Kidney disease in her father; Leukemia in her brother; Lupus in her daughter; Obesity in her mother; Pernicious anemia in her sister; Thyroid disease in her mother and sister. There is no history of Colon cancer, Esophageal cancer, Rectal cancer, Stomach cancer, Pancreatic cancer, Prostate cancer, or Colon polyps.  ROS:   Please see the history of present illness.    All other systems reviewed and are negative.  EKGs/Labs/Other Studies Reviewed Today:     ***  EKG:  Last EKG results: ***   Recent Labs: 06/22/2022: ALT 21 07/04/2022: BUN 12; Creatinine, Ser 0.95; Hemoglobin 13.8; Platelets 247; Potassium 4.8; Sodium 143 07/24/2022: TSH 1.09     Physical Exam:    VS:  There were no vitals taken for this visit.    Wt Readings from Last 3 Encounters:  07/25/22 234 lb (106.1 kg)  07/17/22 233 lb (105.7 kg)  07/09/22 230 lb (104.3 kg)     GEN: *** Well nourished, well developed in no acute distress CARDIAC: ***RRR, no murmurs, rubs,  gallops RESPIRATORY:  Normal work of breathing MUSCULOSKELETAL: *** edema    ASSESSMENT & PLAN:    ***        Medication Adjustments/Labs and Tests Ordered: Current medicines are reviewed at length with the patient today.  Concerns regarding medicines are outlined above.  No orders of the defined types were placed in this encounter.  No orders of the defined types were placed in this encounter.    Signed, Maurice Small, MD  08/06/2022 4:03 PM    [facility]

## 2022-08-07 ENCOUNTER — Encounter: Payer: Self-pay | Admitting: Cardiovascular Disease

## 2022-08-07 ENCOUNTER — Other Ambulatory Visit (HOSPITAL_COMMUNITY): Payer: Self-pay

## 2022-08-07 ENCOUNTER — Ambulatory Visit
Payer: No Typology Code available for payment source | Attending: Cardiovascular Disease | Admitting: Cardiovascular Disease

## 2022-08-07 VITALS — BP 126/88 | HR 63 | Ht 65.0 in | Wt 234.0 lb

## 2022-08-07 DIAGNOSIS — I639 Cerebral infarction, unspecified: Secondary | ICD-10-CM | POA: Diagnosis not present

## 2022-08-07 NOTE — Patient Instructions (Signed)
Medication Instructions:  Your physician recommends that you continue on your current medications as directed. Please refer to the Current Medication list given to you today.  Labwork: None ordered.  Testing/Procedures: None ordered.  Follow-Up:  Your physician wants you to follow-up in: one year with Dr. Myles Gip.  You will receive a reminder letter in the mail two months in advance. If you don't receive a letter, please call our office to schedule the follow-up appointment.    Implantable Loop Recorder Placement, Care After This sheet gives you information about how to care for yourself after your procedure. Your health care provider may also give you more specific instructions. If you have problems or questions, contact your health care provider. What can I expect after the procedure? After the procedure, it is common to have: Soreness or discomfort near the incision. Some swelling or bruising near the incision.  Follow these instructions at home: Incision care  Monitor your cardiac device site for redness, swelling, and drainage. Call the device clinic at [phone] if you experience these symptoms or fever/chills.  Keep the large square bandage on your site for 24 hours and then you may remove it yourself. Keep the steri-strips underneath in place.   You may shower after 72 hours / 3 days from your procedure with the steri-strips in place. They will usually fall off on their own, or may be removed after 10 days. Pat dry.   Avoid lotions, ointments, or perfumes over your incision until it is well-healed.  Please do not submerge in water until your site is completely healed.   Your device is MRI compatible.   Remote monitoring is used to monitor your cardiac device from home. This monitoring is scheduled every month by our office. It allows [place] to keep an eye on the function of your device to ensure it is working properly.  If your wound site starts to bleed apply pressure.     For help with the monitor please call [employer] Monitor Support Specialist directly at [phone].    If you have any questions/concerns please call the device clinic at [phone].  Activity  Return to your normal activities.  General instructions Follow instructions from your health care provider about how to manage your implantable loop recorder and transmit the information. Learn how to activate a recording if this is necessary for your type of device. You may go through a metal detection gate, and you may let someone hold a metal detector over your chest. Show your ID card if needed. Do not have an MRI unless you check with your health care provider first. Take over-the-counter and prescription medicines only as told by your health care provider. Keep all follow-up visits as told by your health care provider. This is important. Contact a health care provider if: You have redness, swelling, or pain around your incision. You have a fever. You have pain that is not relieved by your pain medicine. You have triggered your device because of fainting (syncope) or because of a heartbeat that feels like it is racing, slow, fluttering, or skipping (palpitations). Get help right away if you have: Chest pain. Difficulty breathing. Summary After the procedure, it is common to have soreness or discomfort near the incision. Change your dressing as told by your health care provider. Follow instructions from your health care provider about how to manage your implantable loop recorder and transmit the information. Keep all follow-up visits as told by your health care provider. This is important. This information  is not intended to replace advice given to you by your health care provider. Make sure you discuss any questions you have with your health care provider. Document Released: 07/18/2015 Document Revised: 09/21/2017 Document Reviewed: 09/21/2017 Elsevier Patient Education  2020 [employer].

## 2022-08-09 ENCOUNTER — Other Ambulatory Visit (HOSPITAL_COMMUNITY): Payer: Self-pay

## 2022-08-09 ENCOUNTER — Other Ambulatory Visit: Payer: Self-pay | Admitting: Family Medicine

## 2022-08-09 DIAGNOSIS — F419 Anxiety disorder, unspecified: Secondary | ICD-10-CM

## 2022-08-09 MED ORDER — ALPRAZOLAM 0.25 MG PO TABS
0.2500 mg | ORAL_TABLET | Freq: Three times a day (TID) | ORAL | 2 refills | Status: DC | PRN
  Filled 2022-08-09: qty 30, 10d supply, fill #0

## 2022-08-27 ENCOUNTER — Other Ambulatory Visit: Payer: Self-pay | Admitting: Family Medicine

## 2022-08-27 ENCOUNTER — Other Ambulatory Visit: Payer: Self-pay

## 2022-08-27 ENCOUNTER — Other Ambulatory Visit (HOSPITAL_COMMUNITY): Payer: Self-pay

## 2022-08-27 ENCOUNTER — Other Ambulatory Visit: Payer: Self-pay | Admitting: Gastroenterology

## 2022-08-28 ENCOUNTER — Encounter: Payer: Self-pay | Admitting: Family Medicine

## 2022-08-28 ENCOUNTER — Other Ambulatory Visit (HOSPITAL_COMMUNITY): Payer: Self-pay

## 2022-08-30 ENCOUNTER — Other Ambulatory Visit (HOSPITAL_COMMUNITY): Payer: Self-pay

## 2022-08-30 MED ORDER — ATORVASTATIN CALCIUM 40 MG PO TABS
40.0000 mg | ORAL_TABLET | Freq: Every day | ORAL | 1 refills | Status: DC
  Filled 2022-08-30: qty 90, 90d supply, fill #0
  Filled 2022-12-01: qty 90, 90d supply, fill #1

## 2022-09-10 ENCOUNTER — Ambulatory Visit: Payer: No Typology Code available for payment source | Attending: Cardiovascular Disease

## 2022-09-10 DIAGNOSIS — I639 Cerebral infarction, unspecified: Secondary | ICD-10-CM | POA: Diagnosis not present

## 2022-09-11 ENCOUNTER — Telehealth: Payer: Self-pay | Admitting: Gastroenterology

## 2022-09-11 NOTE — Telephone Encounter (Signed)
Patient last seen 10-2021 at which time she requested Dexilant bc Pantoprazole 40 mg BID was not working well.  I called and Left a detailed message for patient asking her to call back or MyChart the reason she is requesting pantoprazole again and what dose and frequency she is wanting, 40 mg Twice a day?  Is the problem she is experiencing due to insurance? Once we have more information we can send the request to Dr. Havery Moros for his review.

## 2022-09-11 NOTE — Telephone Encounter (Signed)
Inbound call from patient seeking advice if there is anyway Dr. Havery Moros can send in a prescription for Protonix. Patient is requesting it to be sent to [facility] out patient pharmacy. Please advise.

## 2022-09-12 LAB — CUP PACEART REMOTE DEVICE CHECK
Date Time Interrogation Session: 20240121125216
Implantable Pulse Generator Implant Date: 20231219

## 2022-09-12 NOTE — Telephone Encounter (Signed)
Patient is calling wishing to speak with someone about the medication states all the information is not getting across and she tried to send a MyChart message and hasn't heard anything. Please advise

## 2022-09-12 NOTE — Telephone Encounter (Signed)
Called patient and left detailed message for her to return the call and [place] with whoever she speaks to with all the information regarding her request.

## 2022-09-13 ENCOUNTER — Other Ambulatory Visit (HOSPITAL_COMMUNITY): Payer: Self-pay

## 2022-09-13 MED ORDER — PANTOPRAZOLE SODIUM 40 MG PO TBEC
40.0000 mg | DELAYED_RELEASE_TABLET | ORAL | 0 refills | Status: DC
  Filled 2022-09-13: qty 180, 90d supply, fill #0

## 2022-10-01 ENCOUNTER — Encounter: Payer: Self-pay | Admitting: Family Medicine

## 2022-10-01 ENCOUNTER — Other Ambulatory Visit: Payer: Self-pay | Admitting: Family Medicine

## 2022-10-01 ENCOUNTER — Other Ambulatory Visit (HOSPITAL_COMMUNITY): Payer: Self-pay

## 2022-10-01 MED ORDER — CLOPIDOGREL BISULFATE 75 MG PO TABS
75.0000 mg | ORAL_TABLET | Freq: Every day | ORAL | 2 refills | Status: DC
  Filled 2022-10-01: qty 30, 30d supply, fill #0
  Filled 2022-11-01: qty 30, 30d supply, fill #1
  Filled 2022-12-01: qty 30, 30d supply, fill #2

## 2022-10-05 ENCOUNTER — Other Ambulatory Visit (HOSPITAL_COMMUNITY): Payer: Self-pay

## 2022-10-06 ENCOUNTER — Other Ambulatory Visit (HOSPITAL_COMMUNITY): Payer: Self-pay

## 2022-10-09 ENCOUNTER — Other Ambulatory Visit (HOSPITAL_COMMUNITY): Payer: Self-pay

## 2022-10-15 ENCOUNTER — Ambulatory Visit (INDEPENDENT_AMBULATORY_CARE_PROVIDER_SITE_OTHER): Payer: No Typology Code available for payment source | Admitting: Family

## 2022-10-15 ENCOUNTER — Ambulatory Visit: Payer: No Typology Code available for payment source | Attending: Cardiovascular Disease

## 2022-10-15 ENCOUNTER — Other Ambulatory Visit (HOSPITAL_BASED_OUTPATIENT_CLINIC_OR_DEPARTMENT_OTHER): Payer: Self-pay

## 2022-10-15 ENCOUNTER — Encounter: Payer: Self-pay | Admitting: Family

## 2022-10-15 VITALS — BP 120/68 | HR 55 | Temp 97.8°F | Resp 16 | Wt 235.0 lb

## 2022-10-15 DIAGNOSIS — I639 Cerebral infarction, unspecified: Secondary | ICD-10-CM

## 2022-10-15 DIAGNOSIS — R21 Rash and other nonspecific skin eruption: Secondary | ICD-10-CM | POA: Insufficient documentation

## 2022-10-15 MED ORDER — COMIRNATY 30 MCG/0.3ML IM SUSY
PREFILLED_SYRINGE | INTRAMUSCULAR | 0 refills | Status: DC
  Filled 2022-10-15: qty 0.3, 1d supply, fill #0

## 2022-10-15 MED ORDER — PREDNISONE 10 MG PO TABS
ORAL_TABLET | ORAL | 0 refills | Status: DC
  Filled 2022-10-15: qty 20, 8d supply, fill #0

## 2022-10-15 NOTE — Assessment & Plan Note (Signed)
New.  Cause unclear. Will rx with prednisone taper, ok to use benadryl prn, oatmeal baths for comfort. Call if symptoms worsen or if symptoms fail to improve.

## 2022-10-15 NOTE — Progress Notes (Signed)
Subjective:   By signing my name below, I, Jamey Reas, attest that this documentation has been prepared under the direction and in the presence of Debbrah Alar, NP. 10/15/2022   Patient ID: Emily Osborne, female    DOB: [birth year], 62 y.o.   MRN: [id number]  Chief Complaint  Patient presents with   Rash    Complains of skin rash in chest area    HPI Patient is in today for an office visit.  Rash: She complains of an itchy, burning rash since yesterday. Her face is burning as a result. She has not changed any skin care products or detergents recently.  Covid-19: She tested negative for Covid-19 twice. She did not have an appetite yesterday. Her daughter and granddaughter who live with her both have covid.   A1C: Her readings have been normal, but previously were elevated.  Lab Results  Component Value Date   HGBA1C 5.6 06/23/2022   Immunizations: She is not UTD on the latest Covid-19 booster immunization but will consider getting it at her pharmacy.    Past Medical History:  Diagnosis Date   Allergy    Anxiety    Asthma    pt has inhaler   Back pain    C. difficile colitis    Cataract    bil eyes   Chest pain    Chronic fatigue syndrome    Clostridium difficile colitis 12/13/2016   Diverticulitis    Dyspnea    Dysrhythmia    palpitations   Edema    Elevated blood pressure reading without diagnosis of hypertension    "just elevated when I'm in pain" (12/07/2015)   Fatty liver    Fibromyalgia    Ganglion of joint    right wrist   GERD (gastroesophageal reflux disease)    H. pylori infection    Headache    hx of   Helicobacter pylori (H. pylori) infection 12/13/2016   Herpes zoster without mention of complication    HTN (hypertension)    Hyperglycemia 08/07/2018   Hyperlipidemia    Hyperthyroidism    IBS (irritable bowel syndrome)    Leg edema    Multiple drug allergies 06/18/2017   Myalgia    Nontraumatic rupture of Achilles tendon    Other  acute reactions to stress    Pain in joint, shoulder region    Pain in joint, upper arm    Pain in limb    Palpitations    Personal history of other diseases of digestive system    Pneumonia    once   PONV (postoperative nausea and vomiting)    Routine screening for STI (sexually transmitted infection) 12/13/2016   S/P partial colectomy 06/18/2017   Thyroid disease    hyperthyroidism   Vitamin B 12 deficiency     Past Surgical History:  Procedure Laterality Date   ABDOMINAL HYSTERECTOMY     ACHILLES TENDON REPAIR Right 2005   BUBBLE STUDY  07/09/2022   Procedure: BUBBLE STUDY;  Surgeon: Buford Dresser, MD;  Location: [facility];  Service: Cardiovascular;;   BUNIONECTOMY Bilateral    Bunionectomy 1983   COLON SURGERY  01/23/2017   6 to 8 inches sigmoid colon removed   COLONOSCOPY     GANGLION CYST EXCISION Right    wrist   LEFT HEART CATH AND CORONARY ANGIOGRAPHY N/A 10/03/2016   Procedure: Left Heart Cath and Coronary Angiography;  Surgeon: Burnell Blanks, MD;  Location: [facility];  Service: Cardiovascular;  Laterality: N/A;   MENISCUS REPAIR Right 04/2014   POLYPECTOMY     TEE WITHOUT CARDIOVERSION N/A 07/09/2022   Procedure: TRANSESOPHAGEAL ECHOCARDIOGRAM (TEE);  Surgeon: Buford Dresser, MD;  Location: [facility];  Service: Cardiovascular;  Laterality: N/A;   TUBAL LIGATION     wisdomteeth extraction      Family History  Problem Relation Age of Onset   Hyperlipidemia Mother    Hypertension Mother        Died, 75   Thyroid disease Mother        Thyroid surgery   Obesity Mother    Heart disease Mother    Hypertension Father    Diabetes Father    Heart disease Father    Kidney disease Father        Died, 52   Hyperlipidemia Father    Pernicious anemia Sister    Thyroid disease Sister        On thyroid Rx   Atrial fibrillation Sister    Leukemia Brother    Cancer Brother        myleoblastic anemia   Coronary artery disease  Brother    Hypertension Brother    Lupus Daughter    Colon cancer Neg Hx    Esophageal cancer Neg Hx    Rectal cancer Neg Hx    Stomach cancer Neg Hx    Pancreatic cancer Neg Hx    Prostate cancer Neg Hx    Colon polyps Neg Hx     Social History   Socioeconomic History   Marital status: Divorced    Spouse name: Not on file   Number of children: 2   Years of education: Not on file   Highest education level: Not on file  Occupational History   Occupation: Personnel officer  Tobacco Use   Smoking status: Never   Smokeless tobacco: Never  Vaping Use   Vaping Use: Never used  Substance and Sexual Activity   Alcohol use: No    Alcohol/week: 0.0 standard drinks of alcohol   Drug use: No   Sexual activity: Not Currently    Partners: Male  Other Topics Concern   Not on file  Social History Narrative   Lives with alone.  She has two daughter.   She works as a Programme researcher, broadcasting/film/video at [employer]--- no   Right Handed   Social Determinants of Radio broadcast assistant Strain: Not on file  Food Insecurity: Not on file  Transportation Needs: Not on file  Physical Activity: Not on file  Stress: Not on file  Social Connections: Not on file  Intimate Partner Violence: Not on file    Outpatient Medications Prior to Visit  Medication Sig Dispense Refill   acetaminophen (TYLENOL) 500 MG tablet Take 1,000 mg by mouth every 6 (six) hours as needed for moderate pain.     albuterol (VENTOLIN HFA) 108 (90 Base) MCG/ACT inhaler Inhale 2 puffs into the lungs every 4 (four) hours as needed for wheezing or shortness of breath. 18 g 2   ALPRAZolam (XANAX) 0.25 MG tablet Take 1 tablet (0.25 mg total) by mouth 3 (three) times daily as needed. 30 tablet 2   atorvastatin (LIPITOR) 40 MG tablet Take 1 tablet (40 mg total) by mouth daily. 90 tablet 1   Blood Glucose Monitoring Suppl (FREESTYLE FREEDOM) KIT 1 Device by Does not apply route daily. 1 each 0   budesonide-formoterol (SYMBICORT)  80-4.5 MCG/ACT inhaler Inhale 2 puffs into the lungs 2 (two) times  daily, in the morning and 12 hours later. 10.2 g 11   clopidogrel (PLAVIX) 75 MG tablet Take 1 tablet (75 mg total) by mouth daily. 30 tablet 2   diclofenac sodium (VOLTAREN) 1 % GEL Apply 2 g topically 4 (four) times daily. Rub into affected area of foot 2 to 4 times daily (Patient taking differently: Apply 2 g topically 4 (four) times daily as needed (pain). Rub into affected area of foot 2 to 4 times daily) 100 g 2   famotidine (PEPCID) 20 MG tablet One after supper (Patient taking differently: Take 20 mg by mouth daily.) 30 tablet 11   fluticasone (FLONASE) 50 MCG/ACT nasal spray USE 2 SPRAYS INTO BOTH NOSTRILS DAILY. (Patient taking differently: Place 1 spray into both nostrils daily.) 16 g 5   glucose blood (FREESTYLE LITE) test strip USE TO CHECK BLOOD SUGAR ONCE A DAY 100 each 1   levocetirizine (XYZAL) 5 MG tablet TAKE 1 TABLET BY MOUTH EVERY EVENING. (Patient taking differently: Take 5 mg by mouth every evening.) 90 tablet 3   lidocaine (LIDODERM) 5 % Apply to the affected area once daily as needed for a week. **Remove patch after 12 hours** 30 patch 0   methimazole (TAPAZOLE) 5 MG tablet Take 1 tablet (5 mg total) by mouth 3 (three) times daily. (Patient taking differently: Take 5 mg by mouth daily.) 90 tablet 1   pantoprazole (PROTONIX) 40 MG tablet Take 1 tablet (40 mg total) by mouth 1 - 2 times a day (needs office visit) 180 tablet 0   Probiotic Product (ALIGN) 4 MG CAPS Take 1 capsule (4 mg total) by mouth daily. 90 capsule 3   sucralfate (CARAFATE) 1 g tablet Take 1 tablet by mouth every 6 hours as needed. (Patient taking differently: Take 1 g by mouth every 6 (six) hours as needed (stomach issues).) 60 tablet 3   valACYclovir (VALTREX) 1000 MG tablet Take 1 tablet (1,000 mg total) by mouth 3 (three) times daily as needed. (Patient taking differently: Take 1,000 mg by mouth 3 (three) times daily as needed (fever  blisters).) 30 tablet 2   No facility-administered medications prior to visit.    Allergies  Allergen Reactions   Aspirin Other (See Comments)    REACTION: anaphylaxis   Gentamicin Other (See Comments)    Eye drops turned the sclera bright red   Ibuprofen Other (See Comments)    REACTION: anaphylaxsis   Metronidazole Swelling    REACTION: red face/swelling   Nsaids Other (See Comments)    REACTION: anaphylaxis   Doxycycline Other (See Comments)    REACTION: severe nausea/vomiting   Influenza A (H1n1) [place] Vac Other (See Comments)    REACTION: sick for 3 weeks   Loratadine Other (See Comments)    Fatigue/weakness   Metformin And Related Other (See Comments)    Memory issues and headache   Periactin [Cyproheptadine] Other (See Comments)    paranoid    Ranitidine Hcl Other (See Comments)    REACTION: Lips turned red /peel   Sulfamethoxazole-Trimethoprim Other (See Comments)    REACTION: face red/peel   Tramadol Hcl Other (See Comments)    REACTION: paranoid    ROS    See HPI Objective:    Physical Exam Constitutional:      General: She is not in acute distress.    Appearance: Normal appearance.  HENT:     Head: Normocephalic and atraumatic.     Right Ear: External ear normal.     Left  Ear: External ear normal.  Eyes:     Extraocular Movements: Extraocular movements intact.  Cardiovascular:     Rate and Rhythm: Normal rate.  Pulmonary:     Effort: Pulmonary effort is normal.  Skin:    General: Skin is warm and dry.     Comments: Diffuse macular rash on chest and back     Neurological:     Mental Status: She is alert and oriented to person, place, and time.  Psychiatric:        Judgment: Judgment normal.     BP 120/68 (BP Location: Right Arm, Patient Position: Sitting, Cuff Size: Large)   Pulse (!) 55   Temp 97.8 F (36.6 C) (Oral)   Resp 16   Wt 235 lb (106.6 kg)   SpO2 100%   BMI 39.11 kg/m  Wt Readings from Last 3 Encounters:  10/15/22 235  lb (106.6 kg)  08/07/22 234 lb (106.1 kg)  07/25/22 234 lb (106.1 kg)       Assessment & Plan:  Skin rash Assessment & Plan: New.  Cause unclear. Will rx with prednisone taper, ok to use benadryl prn, oatmeal baths for comfort. Call if symptoms worsen or if symptoms fail to improve.    Other orders -     predniSONE; Take 4 tablets by mouth once daily for 2 days, then 3 tablets daily for 2 days, then 2 tablets daily for 2 days, and then 1 tablet daily for 2 days.  Dispense: 20 tablet; Refill: 0    I, Nance Pear, NP, personally preformed the services described in this documentation.  All medical record entries made by the scribe were at my direction and in my presence.  I have reviewed the chart and discharge instructions (if applicable) and agree that the record reflects my personal performance and is accurate and complete. 10/15/2022  Jana Half O'Sullivan,acting as a scribe for Nance Pear, NP.,have documented all relevant documentation on the behalf of Nance Pear, NP,as directed by  Nance Pear, NP while in the presence of Nance Pear, NP.   Nance Pear, NP

## 2022-10-16 ENCOUNTER — Other Ambulatory Visit (HOSPITAL_BASED_OUTPATIENT_CLINIC_OR_DEPARTMENT_OTHER): Payer: Self-pay

## 2022-10-16 ENCOUNTER — Ambulatory Visit (INDEPENDENT_AMBULATORY_CARE_PROVIDER_SITE_OTHER): Payer: No Typology Code available for payment source | Admitting: Internal Medicine

## 2022-10-16 ENCOUNTER — Other Ambulatory Visit (HOSPITAL_COMMUNITY): Payer: Self-pay

## 2022-10-16 ENCOUNTER — Encounter: Payer: Self-pay | Admitting: Internal Medicine

## 2022-10-16 VITALS — BP 132/82 | HR 71 | Ht 65.0 in | Wt 238.0 lb

## 2022-10-16 DIAGNOSIS — E0500 Thyrotoxicosis with diffuse goiter without thyrotoxic crisis or storm: Secondary | ICD-10-CM

## 2022-10-16 LAB — CUP PACEART REMOTE DEVICE CHECK
Date Time Interrogation Session: 20240225230439
Implantable Pulse Generator Implant Date: 20231219

## 2022-10-16 MED ORDER — METHIMAZOLE 5 MG PO TABS
5.0000 mg | ORAL_TABLET | Freq: Every day | ORAL | 1 refills | Status: DC
  Filled 2022-10-16: qty 90, 90d supply, fill #0

## 2022-10-16 NOTE — Progress Notes (Signed)
She patient ID: Rudene Christians, female   DOB: 01/06/1961, 62 y.o.   MRN: [id number]  HPI  SHIAH OAKEY is a 62 y.o.-year-old female, returning for follow-up for Graves' disease.  Last visit 4 months ago.  Interim history: At today's visit, she denies palpitations (had 2 episodes last week), tremors, weight loss, anxiety, heat intolerance. She has cold intolerance. She is more fatigued and also has a pruritic rash. She was started on a 5-day prednisone taper yesterday. She has Covid contacts but no URI sxs. She has a HA today. Also, has hair thinning.   Reviewed and addended history: She has a history of Graves' disease based on elevated TSI antibodies in 2017.  We initially started methimazole 5 mg daily and subsequent TFTs have normalized.  She was the low dose of methimazole, 2.5 mg daily >> stopped 10/2017.  We also started atenolol 25 mg twice a day and she was able to come off but we had restart it as she had intermittent chest pain.  She previously mentioned that she developed chest pain if she stopped this (unclear if placebo component).  She had a cardiac cath in 09/2016 showing no pathology. She saw cardiology 08/2018 >> heart monitor for 2 weeks: one very brief episode that looked a little irregular, but it happened in the middle of the night and only lasted about two seconds.  This was considered normal variant.  She came off atenolol after 09/2018  In 01/2022, a TSH returned undetectable at her visit with PCP.  This was confirmed a month later.  She had fatigue, weakness, anxiety, tremors, palpitations, fast HR (110 bpm), 20 lbs weight loss, no appetite, mental fog, frequent stools, chills. At that time, she had a lot of stress at work.  PCP got in touch with me and she was started on methimazole in 02/2022 - 5 mg 2 times a day, along with atenolol. She decreased the dose to 5 mg 1x a day and stopped atenolol in 04/2022.  She mentions that at that time, she developed lower  extremity edema, which improved since then.  She gained ~10 lbs back before our visit in 05/2022.  I reviewed pt's thyroid tests:  Lab Results  Component Value Date   TSH 1.09 07/24/2022   TSH 0.46 06/15/2022   TSH 0.01 (L) 05/08/2022   TSH <0.01 (L) 03/09/2022   TSH <0.01 (L) 01/23/2022   TSH 0.71 12/11/2021   TSH 0.79 02/02/2021   TSH 1.43 11/17/2020   TSH 1.02 05/23/2020   TSH 0.49 06/11/2019   FREET4 0.69 07/24/2022   FREET4 0.67 06/15/2022   FREET4 0.76 10/07/2018   FREET4 0.75 11/13/2017   FREET4 0.76 05/07/2017   FREET4 0.78 10/05/2016   FREET4 0.74 06/28/2016   FREET4 0.76 04/27/2016   FREET4 1.61 (H) 12/27/2015   FREET4 1.34 (H) 12/08/2015   T3FREE 3.4 07/24/2022   T3FREE 3.2 06/15/2022   T3FREE 3.1 10/07/2018   T3FREE 3.3 11/13/2017   T3FREE 2.9 05/07/2017   T3FREE 3.2 10/05/2016   T3FREE 2.8 06/28/2016   T3FREE 2.8 04/27/2016   T3FREE 5.1 (H) 12/27/2015   T3FREE 3.7 11/02/2015   Antithyroid antibodies: Lab Results  Component Value Date   TSI 449 (H) 06/15/2022   TSI <89 05/07/2017   TSI 393 (H) 12/27/2015   Pt denies: - feeling nodules in neck - hoarseness - dysphagia - choking  Pt does have a FH of thyroid ds. In mother and sister. No FH of thyroid cancer.  No h/o radiation tx to head or neck. No recent contrast studies. + steroid use (se above). No herbal supplements. No Biotin use.  Pt. also has a history of vitamin B12 deficiency, history of sigmoidectomy due to diverticulitis, mild diastolic dysfunction, obesity, memory loss, hyperlipidemia, GERD, HTN, prediabetes: Lab Results  Component Value Date   HGBA1C 5.6 06/23/2022   HGBA1C 6.1 12/11/2021   HGBA1C 6.0 02/02/2021   HGBA1C 5.9 11/17/2020   HGBA1C 5.8 08/31/2019   HGBA1C 5.8 06/11/2019   HGBA1C 6.0 12/11/2018   HGBA1C 5.8 08/07/2018   HGBA1C 6.0 05/13/2018   HGBA1C 5.6 03/20/2018   ROS: Constitutional: + see HPI  Past Medical History:  Diagnosis Date   Allergy    Anxiety     Asthma    pt has inhaler   Back pain    C. difficile colitis    Cataract    bil eyes   Chest pain    Chronic fatigue syndrome    Clostridium difficile colitis 12/13/2016   Diverticulitis    Dyspnea    Dysrhythmia    palpitations   Edema    Elevated blood pressure reading without diagnosis of hypertension    "just elevated when I'm in pain" (12/07/2015)   Fatty liver    Fibromyalgia    Ganglion of joint    right wrist   GERD (gastroesophageal reflux disease)    H. pylori infection    Headache    hx of   Helicobacter pylori (H. pylori) infection 12/13/2016   Herpes zoster without mention of complication    HTN (hypertension)    Hyperglycemia 08/07/2018   Hyperlipidemia    Hyperthyroidism    IBS (irritable bowel syndrome)    Leg edema    Multiple drug allergies 06/18/2017   Myalgia    Nontraumatic rupture of Achilles tendon    Other acute reactions to stress    Pain in joint, shoulder region    Pain in joint, upper arm    Pain in limb    Palpitations    Personal history of other diseases of digestive system    Pneumonia    once   PONV (postoperative nausea and vomiting)    Routine screening for STI (sexually transmitted infection) 12/13/2016   S/P partial colectomy 06/18/2017   Thyroid disease    hyperthyroidism   Vitamin B 12 deficiency    Past Surgical History:  Procedure Laterality Date   ABDOMINAL HYSTERECTOMY     ACHILLES TENDON REPAIR Right 2005   BUBBLE STUDY  07/09/2022   Procedure: BUBBLE STUDY;  Surgeon: Buford Dresser, MD;  Location: [place];  Service: Cardiovascular;;   BUNIONECTOMY Bilateral    Bunionectomy 1983   COLON SURGERY  01/23/2017   6 to 8 inches sigmoid colon removed   COLONOSCOPY     GANGLION CYST EXCISION Right    wrist   LEFT HEART CATH AND CORONARY ANGIOGRAPHY N/A 10/03/2016   Procedure: Left Heart Cath and Coronary Angiography;  Surgeon: Burnell Blanks, MD;  Location: [facility];  Service:  Cardiovascular;  Laterality: N/A;   MENISCUS REPAIR Right 04/2014   POLYPECTOMY     TEE WITHOUT CARDIOVERSION N/A 07/09/2022   Procedure: TRANSESOPHAGEAL ECHOCARDIOGRAM (TEE);  Surgeon: Buford Dresser, MD;  Location: [facility];  Service: Cardiovascular;  Laterality: N/A;   TUBAL LIGATION     wisdomteeth extraction     Social History   Socioeconomic History   Marital status: Divorced    Spouse name: Not on  file   Number of children: 2   Years of education: Not on file   Highest education level: Not on file  Occupational History   Occupation: Nurse/teacher  Tobacco Use   Smoking status: Never   Smokeless tobacco: Never  Vaping Use   Vaping Use: Never used  Substance and Sexual Activity   Alcohol use: No    Alcohol/week: 0.0 standard drinks of alcohol   Drug use: No   Sexual activity: Not Currently    Partners: Male  Other Topics Concern   Not on file  Social History Narrative   Lives with alone.  She has two daughter.   She works as a Programme researcher, broadcasting/film/video at [employer]--- no   Right Handed   Social Determinants of Radio broadcast assistant Strain: Not on file  Food Insecurity: Not on file  Transportation Needs: Not on file  Physical Activity: Not on file  Stress: Not on file  Social Connections: Not on file  Intimate Partner Violence: Not on file   Current Outpatient Medications on File Prior to Visit  Medication Sig Dispense Refill   acetaminophen (TYLENOL) 500 MG tablet Take 1,000 mg by mouth every 6 (six) hours as needed for moderate pain.     albuterol (VENTOLIN HFA) 108 (90 Base) MCG/ACT inhaler Inhale 2 puffs into the lungs every 4 (four) hours as needed for wheezing or shortness of breath. 18 g 2   ALPRAZolam (XANAX) 0.25 MG tablet Take 1 tablet (0.25 mg total) by mouth 3 (three) times daily as needed. 30 tablet 2   atorvastatin (LIPITOR) 40 MG tablet Take 1 tablet (40 mg total) by mouth daily. 90 tablet 1   Blood Glucose Monitoring Suppl  (FREESTYLE FREEDOM) KIT 1 Device by Does not apply route daily. 1 each 0   budesonide-formoterol (SYMBICORT) 80-4.5 MCG/ACT inhaler Inhale 2 puffs into the lungs 2 (two) times daily, in the morning and 12 hours later. 10.2 g 11   clopidogrel (PLAVIX) 75 MG tablet Take 1 tablet (75 mg total) by mouth daily. 30 tablet 2   COVID-19 mRNA vaccine 2023-2024 (COMIRNATY) syringe Inject into the muscle. 0.3 mL 0   diclofenac sodium (VOLTAREN) 1 % GEL Apply 2 g topically 4 (four) times daily. Rub into affected area of foot 2 to 4 times daily (Patient taking differently: Apply 2 g topically 4 (four) times daily as needed (pain). Rub into affected area of foot 2 to 4 times daily) 100 g 2   famotidine (PEPCID) 20 MG tablet One after supper (Patient taking differently: Take 20 mg by mouth daily.) 30 tablet 11   fluticasone (FLONASE) 50 MCG/ACT nasal spray USE 2 SPRAYS INTO BOTH NOSTRILS DAILY. (Patient taking differently: Place 1 spray into both nostrils daily.) 16 g 5   glucose blood (FREESTYLE LITE) test strip USE TO CHECK BLOOD SUGAR ONCE A DAY 100 each 1   levocetirizine (XYZAL) 5 MG tablet TAKE 1 TABLET BY MOUTH EVERY EVENING. (Patient taking differently: Take 5 mg by mouth every evening.) 90 tablet 3   lidocaine (LIDODERM) 5 % Apply to the affected area once daily as needed for a week. **Remove patch after 12 hours** 30 patch 0   methimazole (TAPAZOLE) 5 MG tablet Take 1 tablet (5 mg total) by mouth 3 (three) times daily. (Patient taking differently: Take 5 mg by mouth daily.) 90 tablet 1   pantoprazole (PROTONIX) 40 MG tablet Take 1 tablet (40 mg total) by mouth 1 - 2 times a  day (needs office visit) 180 tablet 0   predniSONE (DELTASONE) 10 MG tablet Take 4 tablets by mouth once daily for 2 days, then 3 tablets daily for 2 days, then 2 tablets daily for 2 days, and then 1 tablet daily for 2 days. 20 tablet 0   Probiotic Product (ALIGN) 4 MG CAPS Take 1 capsule (4 mg total) by mouth daily. 90 capsule 3    sucralfate (CARAFATE) 1 g tablet Take 1 tablet by mouth every 6 hours as needed. (Patient taking differently: Take 1 g by mouth every 6 (six) hours as needed (stomach issues).) 60 tablet 3   valACYclovir (VALTREX) 1000 MG tablet Take 1 tablet (1,000 mg total) by mouth 3 (three) times daily as needed. (Patient taking differently: Take 1,000 mg by mouth 3 (three) times daily as needed (fever blisters).) 30 tablet 2   No current facility-administered medications on file prior to visit.   Allergies  Allergen Reactions   Aspirin Other (See Comments)    REACTION: anaphylaxis   Gentamicin Other (See Comments)    Eye drops turned the sclera bright red   Ibuprofen Other (See Comments)    REACTION: anaphylaxsis   Metronidazole Swelling    REACTION: red face/swelling   Nsaids Other (See Comments)    REACTION: anaphylaxis   Doxycycline Other (See Comments)    REACTION: severe nausea/vomiting   Influenza A (H1n1) Monoval Vac Other (See Comments)    REACTION: sick for 3 weeks   Loratadine Other (See Comments)    Fatigue/weakness   Metformin And Related Other (See Comments)    Memory issues and headache   Periactin [Cyproheptadine] Other (See Comments)    paranoid    Ranitidine Hcl Other (See Comments)    REACTION: Lips turned red /peel   Sulfamethoxazole-Trimethoprim Other (See Comments)    REACTION: face red/peel   Tramadol Hcl Other (See Comments)    REACTION: paranoid   Family History  Problem Relation Age of Onset   Hyperlipidemia Mother    Hypertension Mother        Died, 62   Thyroid disease Mother        Thyroid surgery   Obesity Mother    Heart disease Mother    Hypertension Father    Diabetes Father    Heart disease Father    Kidney disease Father        Died, 69   Hyperlipidemia Father    Pernicious anemia Sister    Thyroid disease Sister        On thyroid Rx   Atrial fibrillation Sister    Leukemia Brother    Cancer Brother        myleoblastic anemia   Coronary  artery disease Brother    Hypertension Brother    Lupus Daughter    Colon cancer Neg Hx    Esophageal cancer Neg Hx    Rectal cancer Neg Hx    Stomach cancer Neg Hx    Pancreatic cancer Neg Hx    Prostate cancer Neg Hx    Colon polyps Neg Hx     PE: BP 132/82 (BP Location: Right Arm, Patient Position: Sitting, Cuff Size: Normal)   Pulse 71   Ht '5\' 5"'$  (1.651 m)   Wt 238 lb (108 kg)   SpO2 99%   BMI 39.61 kg/m  Wt Readings from Last 3 Encounters:  10/16/22 238 lb (108 kg)  10/15/22 235 lb (106.6 kg)  08/07/22 234 lb (106.1 kg)   Constitutional: overweight,  in NAD Eyes:  EOMI, no exophthalmos ENT: no neck masses, no cervical lymphadenopathy Cardiovascular: RRR, No MRG Respiratory: CTA B Musculoskeletal: no deformities Skin:+ Rash on face and abdomen Neurological: no tremor with outstretched hands  ASSESSMENT: 1.  Graves disease - recurrent  PLAN:  1. Patient with a history of Graves' disease, diagnosed based on elevated TSI's, resolved before our visit in 09/2018, but then recurred in 2023.  She was initially on methimazole, which we were able to stop in 10/2017 with subsequent normal TFTs and undetectable TSI until 01/2022, when she started to feel poorly: Fatigue, weakness, anxiety, tremors, palpitations, 20 pound weight loss, mental fog, frequent stools, and chills.  TFTs returned thyrotoxic with an undetectable TSH.  The pattern was confirmed 1 month later.  She was restarted on methimazole 5 mg 2 times a day, on which TFTs improved so she decreased the methimazole dose by herself 2 weeks prior to our last appointment to only 5 mg once a day.  She felt well on this dose, with the majority of her symptoms resolving.  At last visit, we rechecked her TSI's and these were elevated.  However, TFTs normalized including at last check in 07/2022. -At today's visit, she feels well, without thyrotoxic symptoms, however, she has a rash (after the viral contact) for which she just  started prednisone. -She continues on methimazole 5 mg daily -Since she is on prednisone, we need to wait approximately 4 weeks until we can recheck her TFTs  -We did discuss about modalities of treatment for Graves' disease to include continue methimazole, especially if we can use a low-dose, but also definitive treatment with RAI ablation or, last resort, surgery.  Since she is responding well to methimazole, we can continue with this for now. -We do not need to add beta-blockers at this time since she is not tachycardic or tremulous -No sign of Graves' ophthalmopathy: Double vision, blurry vision, eye pain, chemosis -She has no neck compression symptoms or enlarged cervical lymph nodes on palpation -I will see her back in 6 months, possibly sooner for labs  Orders Placed This Encounter  Procedures   TSH   T4, free   T3, free  Will refill the same dose of methimazole for now.  Philemon Kingdom, MD PhD [facility]

## 2022-10-16 NOTE — Patient Instructions (Addendum)
Please come back for labs in 1 month.  Please continue methimazole 5 mg once a day.  Please come back for a follow-up appointment in 6 months.

## 2022-10-24 ENCOUNTER — Other Ambulatory Visit (HOSPITAL_COMMUNITY): Payer: Self-pay

## 2022-10-29 NOTE — Progress Notes (Signed)
[facility] Summary Report / Loop Recorder 

## 2022-11-01 ENCOUNTER — Other Ambulatory Visit (HOSPITAL_COMMUNITY): Payer: Self-pay

## 2022-11-02 ENCOUNTER — Other Ambulatory Visit (HOSPITAL_COMMUNITY): Payer: Self-pay

## 2022-11-13 ENCOUNTER — Other Ambulatory Visit (INDEPENDENT_AMBULATORY_CARE_PROVIDER_SITE_OTHER): Payer: No Typology Code available for payment source

## 2022-11-13 DIAGNOSIS — E0500 Thyrotoxicosis with diffuse goiter without thyrotoxic crisis or storm: Secondary | ICD-10-CM

## 2022-11-13 LAB — TSH: TSH: 2.43 u[IU]/mL (ref 0.35–5.50)

## 2022-11-13 LAB — T3, FREE: T3, Free: 2.7 pg/mL (ref 2.3–4.2)

## 2022-11-13 LAB — T4, FREE: Free T4: 0.82 ng/dL (ref 0.60–1.60)

## 2022-11-13 MED ORDER — METHIMAZOLE 5 MG PO TABS: 5.0000 mg | ORAL_TABLET | ORAL | 1 refills | Status: DC

## 2022-11-19 ENCOUNTER — Ambulatory Visit: Payer: No Typology Code available for payment source

## 2022-11-19 DIAGNOSIS — I639 Cerebral infarction, unspecified: Secondary | ICD-10-CM

## 2022-11-20 LAB — CUP PACEART REMOTE DEVICE CHECK
Date Time Interrogation Session: 20240331230408
Implantable Pulse Generator Implant Date: 20231219

## 2022-11-26 NOTE — Progress Notes (Signed)
[facility] Summary Report / Loop Recorder 

## 2022-11-27 NOTE — Progress Notes (Deleted)
Follow-up Visit   Date: 11/28/2022    Emily Osborne MRN: [id number] DOB: 11-07-1960    Emily Osborne is a 62 y.o. right-handed African American female with fibromyalgia, chronic headaches, hypertension, hyperlipidemia, vitamin B12 deficiency, and Graves disease returning to the clinic for hospital discharge follow-up for stroke.  The patient was accompanied to the clinic by self.   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 No LVO.  TEE with positive R > L shunt concerning for small PFO.   - Appreciate cardiology following, agree with loop recorder implantation  - Continue plavix 75mg  daily  - Continue secondary risk factor modification.  Continue lipitor 40mg  (LDL 95) and BP management.  BP elevated initially (159/110) and upon recheck improved (133/88).  I have asked her to continue to follow BP at home and share with PCP/cardiologist  - Stroke warning signs discussed  2.  Tension headaches, occurring 1-2 times per month - OK to treat as needed with tylenol  Return to clinic in 4 months   --------------------------------------------- UPDATE 07/25/2022: She was admitted on 06/22/2022 with stroke after presenting with left sided weakness/numbness and headache.  MRI brain shows scattered punctate ischemic infarcts bilaterally.  TEE showed normal EF, mild LVH.  CTA head and neck did not show large vessel occlusion or stenosis, there was note of diffuse tortuosity. TEE noted + bubble with R>L shunt.   She will be having loop recorder implanted later this month and if there is no atrial fibrillation present on monitoring, the next step would be PFO closure. She denies any ongoing weakness and feels back to her baseline.  She continues to have headaches about 1-2 times per month over the top of her head.  She typically treats this with tylenol and it resolved within a few minutes.    She is a nonsmoker.  No history of diabetes.  She takes meformin  for weight loss.    UPDATE 11/28/2022:  *** Medications:  Current Outpatient Medications on File Prior to Visit  Medication Sig Dispense Refill   acetaminophen (TYLENOL) 500 MG tablet Take 1,000 mg by mouth every 6 (six) hours as needed for moderate pain.     albuterol (VENTOLIN HFA) 108 (90 Base) MCG/ACT inhaler Inhale 2 puffs into the lungs every 4 (four) hours as needed for wheezing or shortness of breath. 18 g 2   ALPRAZolam (XANAX) 0.25 MG tablet Take 1 tablet (0.25 mg total) by mouth 3 (three) times daily as needed. 30 tablet 2   atorvastatin (LIPITOR) 40 MG tablet Take 1 tablet (40 mg total) by mouth daily. 90 tablet 1   Blood Glucose Monitoring Suppl (FREESTYLE FREEDOM) KIT 1 Device by Does not apply route daily. 1 each 0   budesonide-formoterol (SYMBICORT) 80-4.5 MCG/ACT inhaler Inhale 2 puffs into the lungs 2 (two) times daily, in the morning and 12 hours later. 10.2 g 11   clopidogrel (PLAVIX) 75 MG tablet Take 1 tablet (75 mg total) by mouth daily. 30 tablet 2   COVID-19 mRNA vaccine 2023-2024 (COMIRNATY) syringe Inject into the muscle. 0.3 mL 0   diclofenac sodium (VOLTAREN) 1 % GEL Apply 2 g topically 4 (four) times daily. Rub into affected area of foot 2 to 4 times daily (Patient taking differently: Apply 2 g topically 4 (four) times daily as needed (pain). Rub into affected area of foot 2 to 4 times daily) 100 g 2   famotidine (PEPCID) 20 MG tablet One after supper (Patient taking differently: Take  20 mg by mouth daily.) 30 tablet 11   fluticasone (FLONASE) 50 MCG/ACT nasal spray USE 2 SPRAYS INTO BOTH NOSTRILS DAILY. (Patient taking differently: Place 1 spray into both nostrils daily.) 16 g 5   glucose blood (FREESTYLE LITE) test strip USE TO CHECK BLOOD SUGAR ONCE A DAY 100 each 1   levocetirizine (XYZAL) 5 MG tablet TAKE 1 TABLET BY MOUTH EVERY EVENING. (Patient taking differently: Take 5 mg by mouth every evening.) 90 tablet 3   lidocaine (LIDODERM) 5 % Apply to the affected  area once daily as needed for a week. **Remove patch after 12 hours** 30 patch 0   methimazole (TAPAZOLE) 5 MG tablet Take 1 tablet (5 mg total) by mouth every other day. 45 tablet 1   pantoprazole (PROTONIX) 40 MG tablet Take 1 tablet (40 mg total) by mouth 1 - 2 times a day (needs office visit) 180 tablet 0   predniSONE (DELTASONE) 10 MG tablet Take 4 tablets by mouth once daily for 2 days, then 3 tablets daily for 2 days, then 2 tablets daily for 2 days, and then 1 tablet daily for 2 days. 20 tablet 0   Probiotic Product (ALIGN) 4 MG CAPS Take 1 capsule (4 mg total) by mouth daily. 90 capsule 3   sucralfate (CARAFATE) 1 g tablet Take 1 tablet by mouth every 6 hours as needed. (Patient taking differently: Take 1 g by mouth every 6 (six) hours as needed (stomach issues).) 60 tablet 3   valACYclovir (VALTREX) 1000 MG tablet Take 1 tablet (1,000 mg total) by mouth 3 (three) times daily as needed. (Patient taking differently: Take 1,000 mg by mouth 3 (three) times daily as needed (fever blisters).) 30 tablet 2   No current facility-administered medications on file prior to visit.    Allergies:  Allergies  Allergen Reactions   Aspirin Other (See Comments)    REACTION: anaphylaxis   Gentamicin Other (See Comments)    Eye drops turned the sclera bright red   Ibuprofen Other (See Comments)    REACTION: anaphylaxsis   Metronidazole Swelling    REACTION: red face/swelling   Nsaids Other (See Comments)    REACTION: anaphylaxis   Doxycycline Other (See Comments)    REACTION: severe nausea/vomiting   Influenza A (H1n1) Monoval Vac Other (See Comments)    REACTION: sick for 3 weeks   Loratadine Other (See Comments)    Fatigue/weakness   Metformin And Related Other (See Comments)    Memory issues and headache   Periactin [Cyproheptadine] Other (See Comments)    paranoid    Ranitidine Hcl Other (See Comments)    REACTION: Lips turned red /peel   Sulfamethoxazole-Trimethoprim Other (See  Comments)    REACTION: face red/peel   Tramadol Hcl Other (See Comments)    REACTION: paranoid    Vital Signs:  There were no vitals taken for this visit.  Neurological Exam: MENTAL STATUS including orientation to time, place, person, recent and remote memory, attention span and concentration, language, and fund of knowledge is normal.  Speech is not dysarthric.  CRANIAL NERVES:  No visual field defects.  Pupils equal round and reactive to light.  Normal conjugate, extra-ocular eye movements in all directions of gaze.  No ptosis.  Face is symmetric. Palate elevates symmetrically.  Tongue is midline.  MOTOR:  Motor strength is 5/5 in all extremities.  No atrophy, fasciculations or abnormal movements.  No pronator drift.  Tone is normal.    MSRs:  Reflexes are 2+/4  throughout.  SENSORY:  Intact to vibration throughout.  COORDINATION/GAIT:  Normal finger-to- nose-finger.  Intact rapid alternating movements bilaterally.  Gait narrow based and stable.   Data: Lab Results  Component Value Date   HGBA1C 5.6 06/23/2022   Lab Results  Component Value Date   CHOL 163 06/23/2022   HDL 50 06/23/2022   LDLCALC 95 06/23/2022   LDLDIRECT 139.4 12/26/2011   TRIG 89 06/23/2022   CHOLHDL 3.3 06/23/2022    MRI brain wo contrast 06/22/2022: 1. Few punctate acute ischemic infarcts involving the cortical gray matter of both cerebral hemispheres, presumably embolic. No associated hemorrhage or mass effect. 2. Underlying mild chronic microvascular ischemic disease, stable.  CTA head and neck 06/22/2022:  Negative CTA of the head and neck. No large vessel occlusion or other emergent finding. No hemodynamically significant or correctable stenosis. 2. Diffuse tortuosity of the major arterial vasculature of the head and neck, suggesting chronic underlying hypertension.   TEE 07/09/2022: 1. Left ventricular ejection fraction, by estimation, is 55 to 60%. The  left ventricle has normal function.  The left ventricle has no regional  wall motion abnormalities.   2. Right ventricular systolic function is normal. The right ventricular  size is normal.   3. No left atrial/left atrial appendage thrombus was detected. The LAA  emptying velocity was 79 cm/s.   4. The mitral valve is normal in structure. Mild mitral valve  regurgitation. No evidence of mitral stenosis.   5. The aortic valve is tricuspid. Aortic valve regurgitation is not  visualized. No aortic stenosis is present.   6. Agitated saline contrast bubble study was positive with shunting  observed within 3-6 cardiac cycles suggestive of interatrial shunt. There  is a small patent foramen ovale with predominantly right to left shunting  across the atrial septum.   Conclusion(s)/Recommendation(s): Findings are concerning for an  interatrial shunt as detailed above.     Thank you for allowing me to participate in patient's care.  If I can answer any additional questions, I would be pleased to do so.    Sincerely,    Rahi Chandonnet K. Allena KatzPatel, DO

## 2022-11-28 ENCOUNTER — Ambulatory Visit: Payer: No Typology Code available for payment source | Admitting: Neurology

## 2022-11-28 ENCOUNTER — Encounter: Payer: Self-pay | Admitting: Neurology

## 2022-11-28 DIAGNOSIS — Z029 Encounter for administrative examinations, unspecified: Secondary | ICD-10-CM

## 2022-12-03 ENCOUNTER — Other Ambulatory Visit (HOSPITAL_COMMUNITY): Payer: Self-pay

## 2022-12-03 ENCOUNTER — Telehealth: Payer: Self-pay | Admitting: Gastroenterology

## 2022-12-03 MED ORDER — PANTOPRAZOLE SODIUM 40 MG PO TBEC
40.0000 mg | DELAYED_RELEASE_TABLET | ORAL | 1 refills | Status: DC
  Filled 2022-12-03: qty 60, 30d supply, fill #0

## 2022-12-03 NOTE — Telephone Encounter (Signed)
PATIENT last seen 10-2021. She has an upcoming appointment with Gunnar Fusi in June. Refill sent for 30 days with 1 refill and must keep appointment for further refills.

## 2022-12-03 NOTE — Telephone Encounter (Signed)
Patient called in to get a refill for pantoprazole until her OV with Gunnar Fusi on 6/10/ Please advise

## 2022-12-05 ENCOUNTER — Encounter: Payer: Self-pay | Admitting: Neurology

## 2022-12-05 ENCOUNTER — Ambulatory Visit: Payer: No Typology Code available for payment source | Admitting: Neurology

## 2022-12-05 ENCOUNTER — Other Ambulatory Visit (HOSPITAL_COMMUNITY): Payer: Self-pay

## 2022-12-05 VITALS — BP 134/92 | HR 72 | Ht 65.0 in | Wt 241.0 lb

## 2022-12-05 DIAGNOSIS — I639 Cerebral infarction, unspecified: Secondary | ICD-10-CM

## 2022-12-05 DIAGNOSIS — G44209 Tension-type headache, unspecified, not intractable: Secondary | ICD-10-CM | POA: Diagnosis not present

## 2022-12-05 MED ORDER — CLOPIDOGREL BISULFATE 75 MG PO TABS
75.0000 mg | ORAL_TABLET | Freq: Every day | ORAL | 3 refills | Status: DC
  Filled 2022-12-06: qty 90, 90d supply, fill #0
  Filled 2023-03-01: qty 90, 90d supply, fill #1
  Filled 2023-03-14 – 2023-06-12 (×2): qty 90, 90d supply, fill #2
  Filled 2023-09-06: qty 90, 90d supply, fill #3

## 2022-12-05 NOTE — Progress Notes (Signed)
Follow-up Visit   Date: 12/05/2022    Emily Osborne MRN: [id number] DOB: 07-28-1961    Emily Osborne is a 62 y.o. right-handed African American female with fibromyalgia, chronic headaches, hypertension, hyperlipidemia, vitamin B12 deficiency, and Graves disease returning to the clinic for hospital discharge follow-up for stroke.  The patient was accompanied to the clinic by self.   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  No LVO.  TEE with positive R > L shunt concerning for small PFO.  Loop recorder placed in December 2023. If there is no arrhythmia on monitoring, PFO closure may be considered in the future.  - Continue plavix  daily  - Continue secondary risk factor modification.  Continue lipitor  (LDL 95) and BP management.  BP is better controlled today  (134/92).  2.  Tension headaches, occurring 1-2 times per month - OK to treat as needed with tylenol  Return to clinic in 6 months    --------------------------------------------- UPDATE 07/25/2022: She was admitted on 06/22/2022 with stroke after presenting with left sided weakness/numbness and headache.  MRI brain shows scattered punctate ischemic infarcts bilaterally.  TEE showed normal EF, mild LVH.  CTA head and neck did not show large vessel occlusion or stenosis, there was note of diffuse tortuosity. TEE noted + bubble with R>L shunt.   She will be having loop recorder implanted later this month and if there is no atrial fibrillation present on monitoring, the next step would be PFO closure. She denies any ongoing weakness and feels back to her baseline.  She continues to have headaches about 1-2 times per month over the top of her head.  She typically treats this with tylenol and it resolved within a few minutes.    She is a nonsmoker.  No history of diabetes.  She takes meformin for weight loss.    UPDATE 11/28/2022:  She is here for follow-up visit.   She reports  having some word-finding difficulty and has some word substitution, which has been ongoing since her stroke.  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  She completed PT but continues to have pain and achiness in the left arm.  She denies weakness.   No new neurological symptoms.   She continues to have headaches a few times per month, which improves with tylenol.   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  Medications:  Current Outpatient Medications on File Prior to Visit  Medication Sig Dispense Refill   acetaminophen (TYLENOL) 500 MG tablet Take 1,000 mg by mouth every 6 (six) hours as needed for moderate pain.     albuterol (VENTOLIN HFA) 108 (90 Base) MCG/ACT inhaler Inhale 2 puffs into the lungs every 4 (four) hours as needed for wheezing or shortness of breath. 18 g 2   ALPRAZolam (XANAX) 0.25 MG tablet Take 1 tablet (0.25 mg total) by mouth 3 (three) times daily as needed. 30 tablet 2   atorvastatin (LIPITOR) 40 MG tablet Take 1 tablet (40 mg total) by mouth daily. 90 tablet 1   Blood Glucose Monitoring Suppl (FREESTYLE FREEDOM) KIT 1 Device by Does not apply route daily. 1 each 0   budesonide-formoterol (SYMBICORT) 80-4.5 MCG/ACT inhaler Inhale 2 puffs into the lungs 2 (two) times daily, in the morning and 12 hours later. 10.2 g 11   clopidogrel (PLAVIX) 75 MG tablet Take 1 tablet (75 mg total) by mouth daily. 30  tablet 2   COVID-19 mRNA vaccine 2023-2024 (COMIRNATY) syringe Inject into the muscle. 0.3 mL 0   diclofenac sodium (VOLTAREN) 1 % GEL Apply 2 g topically 4 (four) times daily. Rub into affected area of foot 2 to 4 times daily (Patient taking differently: Apply 2 g topically 4 (four) times daily as needed (pain). Rub into affected area of foot 2 to 4 times daily) 100 g 2   famotidine (PEPCID) 20 MG tablet One after supper (Patient taking differently: Take 20 mg by mouth daily.) 30 tablet 11   fluticasone (FLONASE) 50  MCG/ACT nasal spray USE 2 SPRAYS INTO BOTH NOSTRILS DAILY. (Patient taking differently: Place 1 spray into both nostrils daily.) 16 g 5   glucose blood (FREESTYLE LITE) test strip USE TO CHECK BLOOD SUGAR ONCE A DAY 100 each 1   levocetirizine (XYZAL) 5 MG tablet TAKE 1 TABLET BY MOUTH EVERY EVENING. (Patient taking differently: Take 5 mg by mouth every evening.) 90 tablet 3   lidocaine (LIDODERM) 5 % Apply to the affected area once daily as needed for a week. **Remove patch after 12 hours** 30 patch 0   methimazole (TAPAZOLE) 5 MG tablet Take 1 tablet (5 mg total) by mouth every other day. 45 tablet 1   pantoprazole (PROTONIX) 40 MG tablet Take 1 tablet (40 mg) by mouth 1 - 2 times daily. 60 tablet 1   Probiotic Product (ALIGN) 4 MG CAPS Take 1 capsule (4 mg total) by mouth daily. 90 capsule 3   sucralfate (CARAFATE) 1 g tablet Take 1 tablet by mouth every 6 hours as needed. (Patient taking differently: Take 1 g by mouth every 6 (six) hours as needed (stomach issues).) 60 tablet 3   valACYclovir (VALTREX) 1000 MG tablet Take 1 tablet (1,000 mg total) by mouth 3 (three) times daily as needed. (Patient taking differently: Take 1,000 mg by mouth 3 (three) times daily as needed (fever blisters).) 30 tablet 2   No current facility-administered medications on file prior to visit.    Allergies:  Allergies  Allergen Reactions   Aspirin Other (See Comments)    REACTION: anaphylaxis   Gentamicin Other (See Comments)    Eye drops turned the sclera bright red   Ibuprofen Other (See Comments)    REACTION: anaphylaxsis   Metronidazole Swelling    REACTION: red face/swelling   Nsaids Other (See Comments)    REACTION: anaphylaxis   Doxycycline Other (See Comments)    REACTION: severe nausea/vomiting   Influenza A (H1n1) Monoval Vac Other (See Comments)    REACTION: sick for 3 weeks   Loratadine Other (See Comments)    Fatigue/weakness   Metformin And Related Other (See Comments)    Memory issues  and headache   Periactin [Cyproheptadine] Other (See Comments)    paranoid    Ranitidine Hcl Other (See Comments)    REACTION: Lips turned red /peel   Sulfamethoxazole-Trimethoprim Other (See Comments)    REACTION: face red/peel   Tramadol Hcl Other (See Comments)    REACTION: paranoid    Vital Signs:  BP (!) 134/92   Pulse 72   Ht  (1.651 m)   Wt 241 lb (109.3 kg)   SpO2 96%   BMI 40.10 kg/m   Neurological Exam: MENTAL STATUS including orientation to time, place, person, recent and remote memory, attention span and concentration, language, and fund of knowledge is normal.  Speech is not dysarthric.  CRANIAL NERVES:   Pupils equal round and reactive to light.  Normal conjugate, extra-ocular eye movements in all directions of gaze.  No ptosis.  Face is symmetric. Palate elevates symmetrically.  Tongue is midline.  MOTOR:  Motor strength is 5/5 in all extremities.  No atrophy, fasciculations or abnormal movements.  No pronator drift.  Tone is normal.    MSRs:  Reflexes are 2+/4 throughout.  SENSORY:  Intact to vibration throughout.  COORDINATION/GAIT:  Normal finger-to- nose-finger.  Intact rapid alternating movements bilaterally.  Gait narrow based and stable.   Data: Lab Results  Component Value Date   HGBA1C 5.6 06/23/2022   Lab Results  Component Value Date   CHOL 163 06/23/2022   HDL 50 06/23/2022   LDLCALC 95 06/23/2022   LDLDIRECT 139.4 12/26/2011   TRIG 89 06/23/2022   CHOLHDL 3.3 06/23/2022    MRI brain wo contrast 06/22/2022: 1. Few punctate acute ischemic infarcts involving the cortical gray matter of both cerebral hemispheres, presumably embolic. No associated hemorrhage or mass effect. 2. Underlying mild chronic microvascular ischemic disease, stable.  CTA head and neck 06/22/2022:  Negative CTA of the head and neck. No large vessel occlusion or other emergent finding. No hemodynamically significant or correctable stenosis. 2. Diffuse  tortuosity of the major arterial vasculature of the head and neck, suggesting chronic underlying hypertension.   TEE 07/09/2022: 1. Left ventricular ejection fraction, by estimation, is 55 to 60%. The  left ventricle has normal function. The left ventricle has no regional  wall motion abnormalities.   2. Right ventricular systolic function is normal. The right ventricular  size is normal.   3. No left atrial/left atrial appendage thrombus was detected. The LAA  emptying velocity was 79 cm/s.   4. The mitral valve is normal in structure. Mild mitral valve  regurgitation. No evidence of mitral stenosis.   5. The aortic valve is tricuspid. Aortic valve regurgitation is not  visualized. No aortic stenosis is present.   6. Agitated saline contrast bubble study was positive with shunting  observed within 3-6 cardiac cycles suggestive of interatrial shunt. There  is a small patent foramen ovale with predominantly right to left shunting  across the atrial septum.   Conclusion(s)/Recommendation(s): Findings are concerning for an  interatrial shunt as detailed above.     Thank you for allowing me to participate in patient's care.  If I can answer any additional questions, I would be pleased to do so.    Sincerely,    Harim Bi K. Allena Katz, DO

## 2022-12-05 NOTE — Patient Instructions (Signed)
I will see you back in 6 months 

## 2022-12-06 ENCOUNTER — Other Ambulatory Visit: Payer: Self-pay

## 2022-12-06 ENCOUNTER — Other Ambulatory Visit (HOSPITAL_COMMUNITY): Payer: Self-pay

## 2022-12-10 ENCOUNTER — Other Ambulatory Visit (HOSPITAL_COMMUNITY): Payer: Self-pay

## 2022-12-18 ENCOUNTER — Other Ambulatory Visit (HOSPITAL_COMMUNITY): Payer: Self-pay

## 2022-12-18 ENCOUNTER — Encounter: Payer: Self-pay | Admitting: Family Medicine

## 2022-12-18 ENCOUNTER — Ambulatory Visit (INDEPENDENT_AMBULATORY_CARE_PROVIDER_SITE_OTHER): Payer: No Typology Code available for payment source | Admitting: Family Medicine

## 2022-12-18 VITALS — BP 110/88 | HR 80 | Temp 98.9°F | Resp 18 | Ht 65.0 in | Wt 242.0 lb

## 2022-12-18 DIAGNOSIS — J45909 Unspecified asthma, uncomplicated: Secondary | ICD-10-CM

## 2022-12-18 DIAGNOSIS — E1165 Type 2 diabetes mellitus with hyperglycemia: Secondary | ICD-10-CM | POA: Diagnosis not present

## 2022-12-18 DIAGNOSIS — E1169 Type 2 diabetes mellitus with other specified complication: Secondary | ICD-10-CM

## 2022-12-18 DIAGNOSIS — I10 Essential (primary) hypertension: Secondary | ICD-10-CM

## 2022-12-18 DIAGNOSIS — Z91030 Bee allergy status: Secondary | ICD-10-CM

## 2022-12-18 DIAGNOSIS — J449 Chronic obstructive pulmonary disease, unspecified: Secondary | ICD-10-CM

## 2022-12-18 DIAGNOSIS — Z0000 Encounter for general adult medical examination without abnormal findings: Secondary | ICD-10-CM | POA: Diagnosis not present

## 2022-12-18 DIAGNOSIS — I639 Cerebral infarction, unspecified: Secondary | ICD-10-CM

## 2022-12-18 DIAGNOSIS — K219 Gastro-esophageal reflux disease without esophagitis: Secondary | ICD-10-CM

## 2022-12-18 DIAGNOSIS — E785 Hyperlipidemia, unspecified: Secondary | ICD-10-CM

## 2022-12-18 LAB — HEMOGLOBIN A1C: Hgb A1c MFr Bld: 6.2 % (ref 4.6–6.5)

## 2022-12-18 MED ORDER — FAMOTIDINE 20 MG PO TABS
20.0000 mg | ORAL_TABLET | Freq: Every day | ORAL | 11 refills | Status: DC
  Filled 2022-12-18: qty 30, 30d supply, fill #0

## 2022-12-18 MED ORDER — AZITHROMYCIN 250 MG PO TABS
ORAL_TABLET | ORAL | 0 refills | Status: AC
  Filled 2022-12-18: qty 6, 5d supply, fill #0

## 2022-12-18 MED ORDER — SUCRALFATE 1 G PO TABS
1.0000 g | ORAL_TABLET | Freq: Four times a day (QID) | ORAL | 3 refills | Status: AC | PRN
  Filled 2022-12-18: qty 60, 15d supply, fill #0

## 2022-12-18 MED ORDER — PANTOPRAZOLE SODIUM 40 MG PO TBEC
40.0000 mg | DELAYED_RELEASE_TABLET | ORAL | 1 refills | Status: DC
  Filled 2022-12-18: qty 60, fill #0

## 2022-12-18 MED ORDER — EPINEPHRINE 0.3 MG/0.3ML IJ SOAJ
0.3000 mg | INTRAMUSCULAR | 2 refills | Status: AC | PRN
  Filled 2022-12-18: qty 2, 30d supply, fill #0

## 2022-12-18 NOTE — Progress Notes (Signed)
Subjective:   By signing my name below, I, Emily Osborne, attest that this documentation has been prepared under the direction and in the presence of [name], DO. 12/18/2022   Patient ID: Emily Osborne, female    DOB: [birth year], 62 y.o.   MRN: [id number]  Chief Complaint  Patient presents with   Annual Exam    Pt states fasting     HPI Patient is in today for a comprehensive physical exam.   She is requesting a refill on 20 mg Pepcid, 1 g Sucralfate.  She reports having diarrhea and cramping recently but her symptoms have improved. She is planning on going to [place] in May 2024. She is requesting anti-biotics incase she developed stomach issues. She has tried reaching out to her GI specialist but could not schedule an appointment prior to her trip. She is also requesting a Epi-pen prior to her trip incase her allergies flare up.  She denies fever, new moles, congestion, sinus pain, sore throat, chest pain, palpitations, cough, shortness of breath, wheezing, nausea, vomiting, abdominal pain, diarrhea, constipation, dysuria, frequency, hematuria, new muscle pain, new joint pain, or headaches at this time.  Mammogram was last completed 03/28/2022. Result showed 1. No evidence of breast malignancy. 2. Benign right breast calcifications. Repeat in 1 year.  Colonoscopy was last completed 10/16/2018. 10/16/2018.    Past Medical History:  Diagnosis Date   Allergy    Anxiety    Asthma    pt has inhaler   Back pain    C. difficile colitis    Cataract    bil eyes   Chest pain    Chronic fatigue syndrome    Clostridium difficile colitis 12/13/2016   Diverticulitis    Dyspnea    Dysrhythmia    palpitations   Edema    Elevated blood pressure reading without diagnosis of hypertension    "just elevated when I'm in pain" (12/07/2015)   Fatty liver    Fibromyalgia    Ganglion of joint    right wrist   GERD (gastroesophageal reflux disease)    H. pylori infection     Headache    hx of   Helicobacter pylori (H. pylori) infection 12/13/2016   Herpes zoster without mention of complication    HTN (hypertension)    Hyperglycemia 08/07/2018   Hyperlipidemia    Hyperthyroidism    IBS (irritable bowel syndrome)    Leg edema    Multiple drug allergies 06/18/2017   Myalgia    Nontraumatic rupture of Achilles tendon    Other acute reactions to stress    Pain in joint, shoulder region    Pain in joint, upper arm    Pain in limb    Palpitations    Personal history of other diseases of digestive system    Pneumonia    once   PONV (postoperative nausea and vomiting)    Routine screening for STI (sexually transmitted infection) 12/13/2016   S/P partial colectomy 06/18/2017   Thyroid disease    hyperthyroidism   Vitamin B 12 deficiency     Past Surgical History:  Procedure Laterality Date   ABDOMINAL HYSTERECTOMY     ACHILLES TENDON REPAIR Right 2005   BUBBLE STUDY  07/09/2022   Procedure: BUBBLE STUDY;  Surgeon: Jodelle Red, MD;  Location: [facility] ENDOSCOPY;  Service: Cardiovascular;;   BUNIONECTOMY Bilateral    Bunionectomy 1983   COLON SURGERY  01/23/2017   6 to 8 inches sigmoid colon removed  COLONOSCOPY     GANGLION CYST EXCISION Right    wrist   LEFT HEART CATH AND CORONARY ANGIOGRAPHY N/A 10/03/2016   Procedure: Left Heart Cath and Coronary Angiography;  Surgeon: Kathleene Hazel, MD;  Location: [facility] INVASIVE CV LAB;  Service: Cardiovascular;  Laterality: N/A;   MENISCUS REPAIR Right 04/2014   POLYPECTOMY     TEE WITHOUT CARDIOVERSION N/A 07/09/2022   Procedure: TRANSESOPHAGEAL ECHOCARDIOGRAM (TEE);  Surgeon: Jodelle Red, MD;  Location: [facility] ENDOSCOPY;  Service: Cardiovascular;  Laterality: N/A;   TUBAL LIGATION     wisdomteeth extraction      Family History  Problem Relation Age of Onset   Hyperlipidemia Mother    Hypertension Mother        Died, 73   Thyroid disease Mother        Thyroid surgery   Obesity  Mother    Heart disease Mother    Hypertension Father    Diabetes Father    Heart disease Father    Kidney disease Father        Died, 68   Hyperlipidemia Father    Pernicious anemia Sister    Thyroid disease Sister        On thyroid Rx   Atrial fibrillation Sister    Leukemia Brother    Cancer Brother        myleoblastic anemia   Coronary artery disease Brother    Hypertension Brother    Lupus Daughter    Colon cancer Neg Hx    Esophageal cancer Neg Hx    Rectal cancer Neg Hx    Stomach cancer Neg Hx    Pancreatic cancer Neg Hx    Prostate cancer Neg Hx    Colon polyps Neg Hx     Social History   Socioeconomic History   Marital status: Divorced    Spouse name: Not on file   Number of children: 2   Years of education: Not on file   Highest education level: Not on file  Occupational History   Occupation: Contractor  Tobacco Use   Smoking status: Never   Smokeless tobacco: Never  Vaping Use   Vaping Use: Never used  Substance and Sexual Activity   Alcohol use: No    Alcohol/week: 0.0 standard drinks of alcohol   Drug use: No   Sexual activity: Not Currently    Partners: Male  Other Topics Concern   Not on file  Social History Narrative   Lives with alone.  She has two daughter.   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   Right Handed   Social Determinants of Corporate investment banker Strain: Not on file  Food Insecurity: Not on file  Transportation Needs: Not on file  Physical Activity: Not on file  Stress: Not on file  Social Connections: Not on file  Intimate Partner Violence: Not on file    Outpatient Medications Prior to Visit  Medication Sig Dispense Refill   acetaminophen (TYLENOL) 500 MG tablet Take 1,000 mg by mouth every 6 (six) hours as needed for moderate pain.     albuterol (VENTOLIN HFA) 108 (90 Base) MCG/ACT inhaler Inhale 2 puffs into the lungs every 4 (four) hours as needed for wheezing or shortness of breath.  18 g 2   ALPRAZolam (XANAX) 0.25 MG tablet Take 1 tablet (0.25 mg total) by mouth 3 (three) times daily as needed. 30 tablet 2   atorvastatin (LIPITOR) 40 MG tablet  Take 1 tablet (40 mg total) by mouth daily. 90 tablet 1   Blood Glucose Monitoring Suppl (FREESTYLE FREEDOM) KIT 1 Device by Does not apply route daily. 1 each 0   budesonide-formoterol (SYMBICORT) 80-4.5 MCG/ACT inhaler Inhale 2 puffs into the lungs 2 (two) times daily, in the morning and 12 hours later. 10.2 g 11   clopidogrel (PLAVIX) 75 MG tablet Take 1 tablet (75 mg total) by mouth daily. 90 tablet 3   diclofenac sodium (VOLTAREN) 1 % GEL Apply 2 g topically 4 (four) times daily. Rub into affected area of foot 2 to 4 times daily (Patient taking differently: Apply 2 g topically 4 (four) times daily as needed (pain). Rub into affected area of foot 2 to 4 times daily) 100 g 2   fluticasone (FLONASE) 50 MCG/ACT nasal spray USE 2 SPRAYS INTO BOTH NOSTRILS DAILY. (Patient taking differently: Place 1 spray into both nostrils daily.) 16 g 5   glucose blood (FREESTYLE LITE) test strip USE TO CHECK BLOOD SUGAR ONCE A DAY 100 each 1   levocetirizine (XYZAL) 5 MG tablet TAKE 1 TABLET BY MOUTH EVERY EVENING. (Patient taking differently: Take 5 mg by mouth every evening.) 90 tablet 3   lidocaine (LIDODERM) 5 % Apply to the affected area once daily as needed for a week. **Remove patch after 12 hours** 30 patch 0   methimazole (TAPAZOLE) 5 MG tablet Take 1 tablet (5 mg total) by mouth every other day. 45 tablet 1   Probiotic Product (ALIGN) 4 MG CAPS Take 1 capsule (4 mg total) by mouth daily. 90 capsule 3   valACYclovir (VALTREX) 1000 MG tablet Take 1 tablet (1,000 mg total) by mouth 3 (three) times daily as needed. (Patient taking differently: Take 1,000 mg by mouth 3 (three) times daily as needed (fever blisters).) 30 tablet 2   COVID-19 mRNA vaccine 2023-2024 (COMIRNATY) syringe Inject into the muscle. 0.3 mL 0   famotidine (PEPCID) 20 MG tablet  One after supper (Patient taking differently: Take 20 mg by mouth daily.) 30 tablet 11   pantoprazole (PROTONIX) 40 MG tablet Take 1 tablet (40 mg) by mouth 1 - 2 times daily. 60 tablet 1   sucralfate (CARAFATE) 1 g tablet Take 1 tablet by mouth every 6 hours as needed. (Patient taking differently: Take 1 g by mouth every 6 (six) hours as needed (stomach issues).) 60 tablet 3   No facility-administered medications prior to visit.    Allergies  Allergen Reactions   Aspirin Other (See Comments)    REACTION: anaphylaxis   Gentamicin Other (See Comments)    Eye drops turned the sclera bright red   Ibuprofen Other (See Comments)    REACTION: anaphylaxsis   Metronidazole Swelling    REACTION: red face/swelling   Nsaids Other (See Comments)    REACTION: anaphylaxis   Doxycycline Other (See Comments)    REACTION: severe nausea/vomiting   Influenza A (H1n1) Monoval Vac Other (See Comments)    REACTION: sick for 3 weeks   Loratadine Other (See Comments)    Fatigue/weakness   Metformin And Related Other (See Comments)    Memory issues and headache   Periactin [Cyproheptadine] Other (See Comments)    paranoid    Ranitidine Hcl Other (See Comments)    REACTION: Lips turned red /peel   Sulfamethoxazole-Trimethoprim Other (See Comments)    REACTION: face red/peel   Tramadol Hcl Other (See Comments)    REACTION: paranoid    Review of Systems  Constitutional:  Negative  for fever.  HENT:  Negative for congestion, sinus pain and sore throat.   Respiratory:  Negative for cough, shortness of breath and wheezing.   Cardiovascular:  Negative for chest pain and palpitations.  Gastrointestinal:  Negative for abdominal pain, constipation, diarrhea, nausea and vomiting.  Genitourinary:  Negative for dysuria, frequency and hematuria.  Musculoskeletal:        (-)new muscle pain (-)new joint pain  Skin:        (-)New moles  Neurological:  Negative for headaches.       Objective:    Physical  Exam Vitals and nursing note reviewed.  Constitutional:      General: She is not in acute distress.    Appearance: Normal appearance. She is not ill-appearing.  HENT:     Head: Normocephalic and atraumatic.     Right Ear: Tympanic membrane, ear canal and external ear normal.     Left Ear: Tympanic membrane, ear canal and external ear normal.  Eyes:     Extraocular Movements: Extraocular movements intact.     Pupils: Pupils are equal, round, and reactive to light.  Cardiovascular:     Rate and Rhythm: Normal rate and regular rhythm.     Heart sounds: Normal heart sounds. No murmur heard.    No gallop.  Pulmonary:     Effort: Pulmonary effort is normal. No respiratory distress.     Breath sounds: Normal breath sounds. No wheezing or rales.  Abdominal:     General: Bowel sounds are normal. There is no distension.     Palpations: Abdomen is soft.     Tenderness: There is no abdominal tenderness. There is no guarding.  Skin:    General: Skin is warm and dry.  Neurological:     General: No focal deficit present.     Mental Status: She is alert and oriented to person, place, and time.  Psychiatric:        Mood and Affect: Mood normal.        Judgment: Judgment normal.     BP 110/88 (BP Location: Right Arm, Patient Position: Sitting, Cuff Size: Large)   Pulse 80   Temp 98.9 F (37.2 C) (Oral)   Resp 18   Ht 5\' 5"  (1.651 m)   Wt 242 lb (109.8 kg)   SpO2 97%   BMI 40.27 kg/m  Wt Readings from Last 3 Encounters:  12/18/22 242 lb (109.8 kg)  12/05/22 241 lb (109.3 kg)  10/16/22 238 lb (108 kg)       Assessment & Plan:  Preventative health care Assessment & Plan: Ghm utd Check labs  Health Maintenance  Topic Date Due   FOOT EXAM  08/30/2020   PAP SMEAR-Modifier  08/07/2021   OPHTHALMOLOGY EXAM  11/10/2021   COVID-19 Vaccine (6 - 2023-24 season) 12/10/2022   Diabetic kidney evaluation - Urine ACR  12/12/2022   HEMOGLOBIN A1C  12/22/2022   MAMMOGRAM  03/29/2023    Diabetic kidney evaluation - eGFR measurement  07/05/2023   COLONOSCOPY (Pts 45-4yrs Insurance coverage will need to be confirmed)  10/17/2023   DTaP/Tdap/Td (4 - Td or Tdap) 07/02/2027   Hepatitis C Screening  Completed   HIV Screening  Completed   Zoster Vaccines- Shingrix  Completed   HPV VACCINES  Aged Out      Gastroesophageal reflux disease, unspecified whether esophagitis present -     Famotidine; Take 1 tablet (20 mg total) by mouth daily after supper.  Dispense: 30 tablet; Refill: 11 -  Sucralfate; Take 1 tablet by mouth every 6 hours as needed.  Dispense: 60 tablet; Refill: 3 -     Pantoprazole Sodium; Take 1 tablet (40 mg total) by mouth 1-2 times a day. Must keep appointment in June for refills  Dispense: 60 tablet; Refill: 1  Bee sting allergy -     EPINEPHrine; Inject 0.3 mg into the muscle as needed for anaphylaxis.  Dispense: 2 each; Refill: 2  Type 2 diabetes mellitus with hyperglycemia, without long-term current use of insulin (HCC) -     Hemoglobin A1c  Chronic obstructive pulmonary disease, unspecified COPD type (HCC)  Hyperlipidemia associated with type 2 diabetes mellitus (HCC)  Hyperlipidemia, unspecified hyperlipidemia type Assessment & Plan: Encourage heart healthy diet such as MIND or DASH diet, increase exercise, avoid trans fats, simple carbohydrates and processed foods, consider a krill or fish or flaxseed oil cap daily.     Asthma, unspecified asthma severity, unspecified whether complicated, unspecified whether persistent Assessment & Plan: Con't albuterol stable   Primary hypertension Assessment & Plan: Well controlled, no changes to meds. Encouraged heart healthy diet such as the DASH diet and exercise as tolerated.     Other orders -     Azithromycin; Take 2 tablets (500 mg total) by mouth daily for 1 day, THEN 1 tablet (250 mg total) daily for 4 days.  Dispense: 6 tablet; Refill: 0    I, [name], DO, personally  preformed the services described in this documentation.  All medical record entries made by the scribe were at my direction and in my presence.  I have reviewed the chart and discharge instructions (if applicable) and agree that the record reflects my personal performance and is accurate and complete. 12/18/2022   I,Emily Osborne,acting as a scribe for [name], DO.,have documented all relevant documentation on the behalf of [name], DO,as directed by  [name], DO while in the presence of [name], DO.   [name], DO

## 2022-12-18 NOTE — Assessment & Plan Note (Signed)
No new symptoms  On plavix

## 2022-12-18 NOTE — Assessment & Plan Note (Signed)
Ghm utd Check labs  Health Maintenance  Topic Date Due   FOOT EXAM  08/30/2020   PAP SMEAR-Modifier  08/07/2021   OPHTHALMOLOGY EXAM  11/10/2021   COVID-19 Vaccine (6 - 2023-24 season) 12/10/2022   Diabetic kidney evaluation - Urine ACR  12/12/2022   HEMOGLOBIN A1C  12/22/2022   MAMMOGRAM  03/29/2023   Diabetic kidney evaluation - eGFR measurement  07/05/2023   COLONOSCOPY (Pts 45-65yrs Insurance coverage will need to be confirmed)  10/17/2023   DTaP/Tdap/Td (4 - Td or Tdap) 07/02/2027   Hepatitis C Screening  Completed   HIV Screening  Completed   Zoster Vaccines- Shingrix  Completed   HPV VACCINES  Aged Out

## 2022-12-18 NOTE — Assessment & Plan Note (Signed)
Encourage heart healthy diet such as MIND or DASH diet, increase exercise, avoid trans fats, simple carbohydrates and processed foods, consider a krill or fish or flaxseed oil cap daily.  °

## 2022-12-18 NOTE — Assessment & Plan Note (Signed)
Well controlled, no changes to meds. Encouraged heart healthy diet such as the DASH diet and exercise as tolerated.  °

## 2022-12-18 NOTE — Assessment & Plan Note (Signed)
Con't albuterol stable

## 2022-12-20 ENCOUNTER — Other Ambulatory Visit (HOSPITAL_COMMUNITY): Payer: Self-pay

## 2022-12-24 ENCOUNTER — Ambulatory Visit (INDEPENDENT_AMBULATORY_CARE_PROVIDER_SITE_OTHER): Payer: No Typology Code available for payment source

## 2022-12-24 DIAGNOSIS — I639 Cerebral infarction, unspecified: Secondary | ICD-10-CM

## 2022-12-24 LAB — CUP PACEART REMOTE DEVICE CHECK
Date Time Interrogation Session: 20240503230353
Implantable Pulse Generator Implant Date: 20231219

## 2022-12-27 NOTE — Progress Notes (Signed)
[facility] Summary Report / Loop Recorder 

## 2023-01-01 ENCOUNTER — Other Ambulatory Visit (INDEPENDENT_AMBULATORY_CARE_PROVIDER_SITE_OTHER): Payer: No Typology Code available for payment source

## 2023-01-01 DIAGNOSIS — E0500 Thyrotoxicosis with diffuse goiter without thyrotoxic crisis or storm: Secondary | ICD-10-CM

## 2023-01-01 LAB — TSH: TSH: 0.91 u[IU]/mL (ref 0.35–5.50)

## 2023-01-01 LAB — T4, FREE: Free T4: 0.87 ng/dL (ref 0.60–1.60)

## 2023-01-01 LAB — T3, FREE: T3, Free: 2.9 pg/mL (ref 2.3–4.2)

## 2023-01-02 ENCOUNTER — Other Ambulatory Visit (HOSPITAL_COMMUNITY): Payer: Self-pay

## 2023-01-02 ENCOUNTER — Other Ambulatory Visit: Payer: Self-pay

## 2023-01-02 ENCOUNTER — Other Ambulatory Visit: Payer: Self-pay | Admitting: Family Medicine

## 2023-01-02 DIAGNOSIS — J302 Other seasonal allergic rhinitis: Secondary | ICD-10-CM

## 2023-01-02 MED ORDER — LEVOCETIRIZINE DIHYDROCHLORIDE 5 MG PO TABS
5.0000 mg | ORAL_TABLET | Freq: Every evening | ORAL | 3 refills | Status: DC
Start: 2023-01-02 — End: 2024-01-08
  Filled 2023-01-02: qty 90, 90d supply, fill #0
  Filled 2023-04-04: qty 90, 90d supply, fill #1
  Filled 2023-07-15: qty 90, 90d supply, fill #2
  Filled 2023-10-15: qty 90, 90d supply, fill #3

## 2023-01-04 ENCOUNTER — Other Ambulatory Visit (HOSPITAL_COMMUNITY): Payer: Self-pay

## 2023-01-23 NOTE — Progress Notes (Signed)
[facility] Summary Report / Loop Recorder 

## 2023-01-28 ENCOUNTER — Ambulatory Visit: Payer: No Typology Code available for payment source

## 2023-01-28 ENCOUNTER — Other Ambulatory Visit (HOSPITAL_COMMUNITY): Payer: Self-pay

## 2023-01-28 ENCOUNTER — Encounter: Payer: Self-pay | Admitting: Nurse Practitioner

## 2023-01-28 ENCOUNTER — Other Ambulatory Visit (INDEPENDENT_AMBULATORY_CARE_PROVIDER_SITE_OTHER): Payer: No Typology Code available for payment source

## 2023-01-28 ENCOUNTER — Other Ambulatory Visit: Payer: Self-pay | Admitting: Family Medicine

## 2023-01-28 ENCOUNTER — Ambulatory Visit: Payer: No Typology Code available for payment source | Admitting: Nurse Practitioner

## 2023-01-28 VITALS — BP 122/86 | HR 67 | Ht 65.0 in | Wt 243.0 lb

## 2023-01-28 DIAGNOSIS — I639 Cerebral infarction, unspecified: Secondary | ICD-10-CM

## 2023-01-28 DIAGNOSIS — R1013 Epigastric pain: Secondary | ICD-10-CM | POA: Diagnosis not present

## 2023-01-28 DIAGNOSIS — K760 Fatty (change of) liver, not elsewhere classified: Secondary | ICD-10-CM | POA: Diagnosis not present

## 2023-01-28 DIAGNOSIS — K219 Gastro-esophageal reflux disease without esophagitis: Secondary | ICD-10-CM | POA: Diagnosis not present

## 2023-01-28 DIAGNOSIS — J302 Other seasonal allergic rhinitis: Secondary | ICD-10-CM

## 2023-01-28 LAB — HEPATIC FUNCTION PANEL
ALT: 20 U/L (ref 0–35)
AST: 20 U/L (ref 0–37)
Albumin: 4.3 g/dL (ref 3.5–5.2)
Alkaline Phosphatase: 73 U/L (ref 39–117)
Bilirubin, Direct: 0.1 mg/dL (ref 0.0–0.3)
Total Bilirubin: 0.6 mg/dL (ref 0.2–1.2)
Total Protein: 7.2 g/dL (ref 6.0–8.3)

## 2023-01-28 LAB — CUP PACEART REMOTE DEVICE CHECK
Date Time Interrogation Session: 20240609230045
Implantable Pulse Generator Implant Date: 20231219

## 2023-01-28 MED ORDER — FLUTICASONE PROPIONATE 50 MCG/ACT NA SUSP
2.0000 | Freq: Every day | NASAL | 5 refills | Status: DC
Start: 2023-01-28 — End: 2024-03-02
  Filled 2023-01-28: qty 16, 30d supply, fill #0
  Filled 2023-03-14: qty 16, 30d supply, fill #1
  Filled 2023-07-25: qty 16, 30d supply, fill #2
  Filled 2023-10-02: qty 16, 30d supply, fill #3
  Filled 2023-11-19 (×2): qty 16, 30d supply, fill #4

## 2023-01-28 MED ORDER — FAMOTIDINE 20 MG PO TABS
20.0000 mg | ORAL_TABLET | Freq: Every day | ORAL | 11 refills | Status: AC
  Filled 2023-01-28: qty 30, 30d supply, fill #0

## 2023-01-28 MED ORDER — PANTOPRAZOLE SODIUM 40 MG PO TBEC
40.0000 mg | DELAYED_RELEASE_TABLET | ORAL | 11 refills | Status: DC
  Filled 2023-01-28: qty 60, 30d supply, fill #0
  Filled 2023-07-17: qty 60, 30d supply, fill #1
  Filled 2023-09-16: qty 60, 30d supply, fill #2
  Filled 2023-11-24: qty 60, 30d supply, fill #3

## 2023-01-28 NOTE — Progress Notes (Signed)
Agree with assessment and plan as outlined.  

## 2023-01-28 NOTE — Progress Notes (Addendum)
Assessment and Plan   Primary GI: Emily Patrick, MD  Brief Narrative:  62 y.o. yo female whose past medical history includes but is not necessarily limited to hypertension, CVA, COPD, asthma, GERD, refractory H. pylori , history of C. difficile , diverticulosis, fatty liver, Graves disease, vitamin B12 deficiency,   Chronic GERD / chronic dyspepsia / history of refractory H.Pylori gastritis.     Neg stool H.pylori antigen in 2019 and gastri biopsies negative for H.pylori in 2020. Symptoms responded best to Dexilant but co-pay is too expensive. Currently doing okay on daily Pepcid and daily pantoprazole.   -Continue daily Pepcid and daily Pantoprazole.   Fatty liver Historically liver enzymes have been only minimally elevated ( < 2 x ULN). Unfortunately she has gained about 13 pounds over the last 6 months.  Discussed potential for fibrotic changes of liver which can lead to cirrhosis -Check hepatic function panel.  -Discussed need for weight loss. Recommended a trial of intermittent fasting. She will try a 12 hour fast from 8 pm to 8 am. She should try to reduce carbohydrate intake -While liberal intake of coffee has been shown to have beneficial effects on fatty liver she needs to limit caffeine intake as it can worsen GERD  History of adenomatous colon polyps but none on most recent colonoscopy in February 2020. -5-year recall colonoscopy was recommended given her history of several adenomas on previous colonoscopy  CVA in November. On plavix. Has loop recorder.  No residual weakness.   History of Present Illness   Chief complaint:  Annual follow up - Protonix refill  Yixin recently called for pantoprazole refill, she has not been seen in over a year and was therefore given this appointment today.   [name] has a history of chronic GERD and chronic epigastric pain. Over the years she has been on a variety of PPIs including Nexium, omeprazole, Prevacid, Protonix, and Pepcid.   She was last seen 10/23/2021. Please refer to that note for details. In summary, she had an insurance change and Dexilant had to be stopped.  Consequently she developed recurrent reflux and epigastric discomfort.  We transitioned her to twice daily pantoprazole but it did not work.  Until Dexilant could be approved by [employer] we put her on twice daily Pepcid, Carafate every 6 hours as needed.  We wrote a letter to the insurance company in support of Dexilant which did get approved but with a co-pay of 110.00 / month which she wasn't affordable. Therefore she has since been on daily Pantoprazole .   Saw Pulmonary with chest pain and they added Pepcid AC at night.   Generic doesn't work so she gets OTC. She feels fairly well on this regimen   Previous GI Endoscopies / Labs / Imaging   EGD 04/2008 - normal esophagus, H pylori gastriits, normal duodenum  Colonoscopy in 2012 with a 3mm descending colon polyp removed but not retrieved.  Colonoscopy  09/30/2015 - seven small colon polyps - adenomas, sessile serrated, recall in 3 years, pancolonic diverticulosis, and hemorrhoids  EGD 05/17/16 - normal esophagus, benign gastric polyps, gastritis, normal duodenum. Biopsies show no celiac, gastric polyps benign, H pylori gastritis noted  EGD 06/04/2017 - gastritis, biposies taken, prominent ampulla - 2cm HH, otherwise normal esophagus - H pylori attempted to be cultured and could not. She followed up with ID   Colonoscopy 10/16/18 - One 3 mm polyp in the sigmoid colon, removed with a cold snare. Resected and retrieved. - One  diminutive polyp at the recto-sigmoid colon, removed with a cold snare. Resected and retrieved. - One 3 mm polyp in the rectum, removed with a cold snare. Resected and retrieved. - Diverticulosis in the entire examined colon. - End-to-end colo-colonic anastomosis, characterized by healthy appearing mucosa. - The examination was otherwise normal.   EGD 10/16/18 - 1 cm hiatal  hernia. - Normal esophagus - Erythematous mucosa in the gastric fundus and gastric body. - Normal stomach otherwise - biopsies taken to rule out H pylori and negative - Normal duodenal bulb and second portion of the duodenum.   1. Surgical [P], gastric antrum and body - MILD CHRONIC GASTRITIS WITHOUT ACTIVITY - NO H. PYLORI OR INTESTINAL METAPLASIA IDENTIFIED - SEE COMMENT 2. Surgical [P], colon, rectosigmoid, sigmoid, rectal, polyp (3) - HYPERPLASTIC POLYP (1 OF 3 FRAGMENTS) - POLYPOID FRAGMENTS OF COLONIC MUCOSA WITH LYMPHOID AGGREGATE(S)(2 OF 3 FRAGMENTS) - NO HIGH GRADE DYSPLASIA OR MALIGNANCY IDENTIFIED 1. A Warthin-Starry stain is performed to determine the possibility of the presence of Helicobacter pylori. The Warthin-Starry stain is negative for organisms morphologically consistent with Helicobacter pylori.        Latest Ref Rng & Units 06/22/2022    1:29 PM 01/23/2022    4:25 PM 12/11/2021   10:33 AM  Hepatic Function  Total Protein 6.5 - 8.1 g/dL 7.5  6.8  7.5   Albumin 3.5 - 5.0 g/dL 4.0  3.8  4.5   AST 15 - 41 U/L 22  31  29    ALT 0 - 44 U/L 21  38  37   Alk Phosphatase 38 - 126 U/L 82  57  67   Total Bilirubin 0.3 - 1.2 mg/dL 0.6  0.5  0.6        Latest Ref Rng & Units 07/04/2022   10:32 AM 06/22/2022    1:15 PM 03/09/2022    8:39 AM  CBC  WBC 3.4 - 10.8 x10E3/uL 5.1  6.3  3.2   Hemoglobin 11.1 - 15.9 g/dL 16.1  09.6  04.5   Hematocrit 34.0 - 46.6 % 42.2  43.4  38.2   Platelets 150 - 450 x10E3/uL 247  200  175.0     Past Medical History:  Diagnosis Date   Allergy    Anxiety    Asthma    pt has inhaler   Back pain    C. difficile colitis    Cataract    bil eyes   Chest pain    Chronic fatigue syndrome    Clostridium difficile colitis 12/13/2016   Diverticulitis    Dyspnea    Dysrhythmia    palpitations   Edema    Elevated blood pressure reading without diagnosis of hypertension    "just elevated when I'm in pain" (12/07/2015)   Fatty liver     Fibromyalgia    Ganglion of joint    right wrist   GERD (gastroesophageal reflux disease)    H. pylori infection    Headache    hx of   Helicobacter pylori (H. pylori) infection 12/13/2016   Herpes zoster without mention of complication    HTN (hypertension)    Hyperglycemia 08/07/2018   Hyperlipidemia    Hyperthyroidism    IBS (irritable bowel syndrome)    Leg edema    Multiple drug allergies 06/18/2017   Myalgia    Nontraumatic rupture of Achilles tendon    Other acute reactions to stress    Pain in joint, shoulder region    Pain in  joint, upper arm    Pain in limb    Palpitations    Personal history of other diseases of digestive system    Pneumonia    once   PONV (postoperative nausea and vomiting)    Routine screening for STI (sexually transmitted infection) 12/13/2016   S/P partial colectomy 06/18/2017   Thyroid disease    hyperthyroidism   Vitamin B 12 deficiency     Past Surgical History:  Procedure Laterality Date   ABDOMINAL HYSTERECTOMY     ACHILLES TENDON REPAIR Right 2005   BUBBLE STUDY  07/09/2022   Procedure: BUBBLE STUDY;  Surgeon: Jodelle Red, MD;  Location: [facility] ENDOSCOPY;  Service: Cardiovascular;;   BUNIONECTOMY Bilateral    Bunionectomy 1983   COLON SURGERY  01/23/2017   6 to 8 inches sigmoid colon removed   COLONOSCOPY     GANGLION CYST EXCISION Right    wrist   LEFT HEART CATH AND CORONARY ANGIOGRAPHY N/A 10/03/2016   Procedure: Left Heart Cath and Coronary Angiography;  Surgeon: Kathleene Hazel, MD;  Location: [facility] INVASIVE CV LAB;  Service: Cardiovascular;  Laterality: N/A;   MENISCUS REPAIR Right 04/2014   POLYPECTOMY     TEE WITHOUT CARDIOVERSION N/A 07/09/2022   Procedure: TRANSESOPHAGEAL ECHOCARDIOGRAM (TEE);  Surgeon: Jodelle Red, MD;  Location: [facility] ENDOSCOPY;  Service: Cardiovascular;  Laterality: N/A;   TUBAL LIGATION     wisdomteeth extraction      Current Medications, Allergies, Family History and Social  History were reviewed in [employer] record.     Current Outpatient Medications  Medication Sig Dispense Refill   acetaminophen (TYLENOL) 500 MG tablet Take 1,000 mg by mouth every 6 (six) hours as needed for moderate pain.     albuterol (VENTOLIN HFA) 108 (90 Base) MCG/ACT inhaler Inhale 2 puffs into the lungs every 4 (four) hours as needed for wheezing or shortness of breath. 18 g 2   ALPRAZolam (XANAX) 0.25 MG tablet Take 1 tablet (0.25 mg total) by mouth 3 (three) times daily as needed. 30 tablet 2   atorvastatin (LIPITOR) 40 MG tablet Take 1 tablet (40 mg total) by mouth daily. 90 tablet 1   Blood Glucose Monitoring Suppl (FREESTYLE FREEDOM) KIT 1 Device by Does not apply route daily. 1 each 0   budesonide-formoterol (SYMBICORT) 80-4.5 MCG/ACT inhaler Inhale 2 puffs into the lungs 2 (two) times daily, in the morning and 12 hours later. 10.2 g 11   clopidogrel (PLAVIX) 75 MG tablet Take 1 tablet (75 mg total) by mouth daily. 90 tablet 3   diclofenac sodium (VOLTAREN) 1 % GEL Apply 2 g topically 4 (four) times daily. Rub into affected area of foot 2 to 4 times daily (Patient taking differently: Apply 2 g topically 4 (four) times daily as needed (pain). Rub into affected area of foot 2 to 4 times daily) 100 g 2   EPINEPHrine (EPIPEN 2-PAK) 0.3 mg/0.3 mL IJ SOAJ injection Inject 0.3 mg into the muscle as needed for anaphylaxis. 2 each 2   famotidine (PEPCID) 20 MG tablet Take 1 tablet (20 mg total) by mouth daily after supper. 30 tablet 11   fluticasone (FLONASE) 50 MCG/ACT nasal spray USE 2 SPRAYS INTO BOTH NOSTRILS DAILY. (Patient taking differently: Place 1 spray into both nostrils daily.) 16 g 5   glucose blood (FREESTYLE LITE) test strip USE TO CHECK BLOOD SUGAR ONCE A DAY 100 each 1   levocetirizine (XYZAL) 5 MG tablet Take 1 tablet (5 mg total) by  mouth every evening. 90 tablet 3   lidocaine (LIDODERM) 5 % Apply to the affected area once daily as needed for a week.  **Remove patch after 12 hours** 30 patch 0   methimazole (TAPAZOLE) 5 MG tablet Take 1 tablet (5 mg total) by mouth every other day. 45 tablet 1   pantoprazole (PROTONIX) 40 MG tablet Take 1 tablet (40 mg total) by mouth 1-2 times a day. Must keep appointment in June for refills 60 tablet 1   Probiotic Product (ALIGN) 4 MG CAPS Take 1 capsule (4 mg total) by mouth daily. 90 capsule 3   sucralfate (CARAFATE) 1 g tablet Take 1 tablet by mouth every 6 hours as needed. 60 tablet 3   valACYclovir (VALTREX) 1000 MG tablet Take 1 tablet (1,000 mg total) by mouth 3 (three) times daily as needed. (Patient taking differently: Take 1,000 mg by mouth 3 (three) times daily as needed (fever blisters).) 30 tablet 2   No current facility-administered medications for this visit.    Review of Systems: No chest pain. No shortness of breath. No urinary complaints.    Physical Exam  Wt Readings from Last 3 Encounters:  12/18/22 242 lb (109.8 kg)  12/05/22 241 lb (109.3 kg)  10/16/22 238 lb (108 kg)    BP 122/86   Pulse 67   Ht 5\' 5"  (1.651 m)   Wt 243 lb (110.2 kg)   BMI 40.44 kg/m  Constitutional:  Pleasant, generally well appearing female in no acute distress. Psychiatric: Normal mood and affect. Behavior is normal. EENT: Pupils normal.  Conjunctivae are normal. No scleral icterus. Neck supple.  Cardiovascular: Normal rate, regular rhythm.  Pulmonary/chest: Effort normal and breath sounds normal. No wheezing, rales or rhonchi. Abdominal: Soft, nondistended, nontender. Bowel sounds active throughout. There are no masses palpable. No hepatomegaly. Neurological: Alert and oriented to person place and time.  Skin: Skin is warm and dry. No rashes noted.  Willette Cluster, NP  01/28/2023, 8:45 AM  Cc:  Zola Button, Grayling Congress, *

## 2023-01-28 NOTE — Patient Instructions (Addendum)
Your provider has requested that you go to the basement level for lab work before leaving today. Press "B" on the elevator. The lab is located at the first door on the left as you exit the elevator.  Due to recent changes in healthcare laws, you may see the results of your imaging and laboratory studies on MyChart before your provider has had a chance to review them.  We understand that in some cases there may be results that are confusing or concerning to you. Not all laboratory results come back in the same time frame and the provider may be waiting for multiple results in order to interpret others.  Please give [place] 48 hours in order for your provider to thoroughly review all the results before contacting the office for clarification of your results.   Continue your pantoprazole every AM and your pepcid AC every night. I have called the pharmacy and put 11 refills on each of these for you.  I appreciate the opportunity to care for you. Willette Cluster NP-C

## 2023-01-29 ENCOUNTER — Other Ambulatory Visit (HOSPITAL_COMMUNITY): Payer: Self-pay

## 2023-01-29 ENCOUNTER — Other Ambulatory Visit: Payer: Self-pay

## 2023-02-14 ENCOUNTER — Encounter: Payer: Self-pay | Admitting: Family Medicine

## 2023-02-14 ENCOUNTER — Encounter: Payer: Self-pay | Admitting: Internal Medicine

## 2023-02-14 ENCOUNTER — Other Ambulatory Visit (HOSPITAL_COMMUNITY): Payer: Self-pay

## 2023-02-14 DIAGNOSIS — E0500 Thyrotoxicosis with diffuse goiter without thyrotoxic crisis or storm: Secondary | ICD-10-CM

## 2023-02-14 MED ORDER — AMOXICILLIN 500 MG PO CAPS
ORAL_CAPSULE | ORAL | 0 refills | Status: DC
  Filled 2023-02-14: qty 24, 6d supply, fill #0

## 2023-02-14 NOTE — Telephone Encounter (Signed)
Pt last seen for CPE on 12/18/22 and only had a A1C checked. Please advise

## 2023-02-15 ENCOUNTER — Other Ambulatory Visit (HOSPITAL_COMMUNITY): Payer: Self-pay

## 2023-02-19 ENCOUNTER — Ambulatory Visit: Payer: No Typology Code available for payment source | Admitting: Gastroenterology

## 2023-02-19 NOTE — Progress Notes (Signed)
[facility] Summary Report / Loop Recorder 

## 2023-02-20 ENCOUNTER — Telehealth: Payer: Self-pay | Admitting: Neurology

## 2023-02-20 NOTE — Telephone Encounter (Signed)
Beth from [facility] office needs clearance for Mirola to have a procedure done. She was to have it done today but they said they did not know she was on plavix. She needs written clearance. Their fax number [phone]

## 2023-02-22 NOTE — Telephone Encounter (Signed)
Called Beth at Dr. Janalyn Shy office and Office currently closed and will re-open Tuesday 02/26/2023 at 9:00am.

## 2023-02-25 NOTE — Telephone Encounter (Signed)
Pt is calling to check the status of the message she is advised about the message from [facility] and she stated that she would wait to hear back from the office to know if she should stop the plavix.

## 2023-02-25 NOTE — Telephone Encounter (Signed)
Pt is scheduled for Wednesday 02/27/23  Orders Placed This Encounter  Procedures   TSH    Standing Status:   Future    Standing Expiration Date:   08/28/2023   T3, free    Standing Status:   Future    Standing Expiration Date:   08/28/2023   T4, free    Standing Status:   Future    Standing Expiration Date:   08/28/2023

## 2023-02-25 NOTE — Telephone Encounter (Signed)
Please Advise,

## 2023-02-25 NOTE — Telephone Encounter (Signed)
Patient called and is asking to do labs not since she is gaining 2 pounds a week and she does not want to wait for labs until August appointment. - call back number is [phone]

## 2023-02-26 NOTE — Telephone Encounter (Signed)
[facility] and was informed that patient is needing a extraction for a lower tooth. They need to know if patient can have this done being on Plavix and needs clearance for this.  Beth stated that they need a letter stating instructions about the Plavix and if patient can have the tooth extraction. Fax number: [phone]

## 2023-02-26 NOTE — Telephone Encounter (Signed)
Letter has been completed

## 2023-02-27 ENCOUNTER — Encounter: Payer: Self-pay | Admitting: Internal Medicine

## 2023-02-27 ENCOUNTER — Other Ambulatory Visit (INDEPENDENT_AMBULATORY_CARE_PROVIDER_SITE_OTHER): Payer: No Typology Code available for payment source

## 2023-02-27 DIAGNOSIS — E0500 Thyrotoxicosis with diffuse goiter without thyrotoxic crisis or storm: Secondary | ICD-10-CM

## 2023-02-27 LAB — TSH: TSH: 0.66 u[IU]/mL (ref 0.35–5.50)

## 2023-02-27 LAB — T4, FREE: Free T4: 0.72 ng/dL (ref 0.60–1.60)

## 2023-02-27 LAB — T3, FREE: T3, Free: 3.2 pg/mL (ref 2.3–4.2)

## 2023-03-01 ENCOUNTER — Other Ambulatory Visit: Payer: Self-pay | Admitting: Family Medicine

## 2023-03-01 ENCOUNTER — Other Ambulatory Visit (HOSPITAL_COMMUNITY): Payer: Self-pay

## 2023-03-01 ENCOUNTER — Other Ambulatory Visit: Payer: Self-pay

## 2023-03-01 MED ORDER — ATORVASTATIN CALCIUM 40 MG PO TABS
40.0000 mg | ORAL_TABLET | Freq: Every day | ORAL | 1 refills | Status: DC
  Filled 2023-03-01: qty 90, 90d supply, fill #0
  Filled 2023-05-28: qty 90, 90d supply, fill #1

## 2023-03-04 ENCOUNTER — Ambulatory Visit (INDEPENDENT_AMBULATORY_CARE_PROVIDER_SITE_OTHER): Payer: No Typology Code available for payment source

## 2023-03-04 ENCOUNTER — Other Ambulatory Visit: Payer: Self-pay | Admitting: Family Medicine

## 2023-03-04 DIAGNOSIS — Z1231 Encounter for screening mammogram for malignant neoplasm of breast: Secondary | ICD-10-CM

## 2023-03-04 DIAGNOSIS — E1169 Type 2 diabetes mellitus with other specified complication: Secondary | ICD-10-CM

## 2023-03-04 DIAGNOSIS — I639 Cerebral infarction, unspecified: Secondary | ICD-10-CM

## 2023-03-05 ENCOUNTER — Other Ambulatory Visit (HOSPITAL_COMMUNITY): Payer: Self-pay

## 2023-03-05 LAB — CUP PACEART REMOTE DEVICE CHECK
Date Time Interrogation Session: 20240712230451
Implantable Pulse Generator Implant Date: 20231219

## 2023-03-05 MED ORDER — HYDROCODONE-ACETAMINOPHEN 7.5-325 MG PO TABS
ORAL_TABLET | ORAL | 0 refills | Status: DC
  Filled 2023-03-05: qty 12, 3d supply, fill #0

## 2023-03-12 ENCOUNTER — Other Ambulatory Visit (INDEPENDENT_AMBULATORY_CARE_PROVIDER_SITE_OTHER): Payer: No Typology Code available for payment source

## 2023-03-12 DIAGNOSIS — E1169 Type 2 diabetes mellitus with other specified complication: Secondary | ICD-10-CM

## 2023-03-12 DIAGNOSIS — E785 Hyperlipidemia, unspecified: Secondary | ICD-10-CM | POA: Diagnosis not present

## 2023-03-12 LAB — COMPREHENSIVE METABOLIC PANEL WITH GFR
ALT: 20 U/L (ref 0–35)
AST: 21 U/L (ref 0–37)
Albumin: 4 g/dL (ref 3.5–5.2)
Alkaline Phosphatase: 69 U/L (ref 39–117)
BUN: 12 mg/dL (ref 6–23)
CO2: 28 meq/L (ref 19–32)
Calcium: 9.1 mg/dL (ref 8.4–10.5)
Chloride: 107 meq/L (ref 96–112)
Creatinine, Ser: 0.91 mg/dL (ref 0.40–1.20)
GFR: 67.31 mL/min
Glucose, Bld: 93 mg/dL (ref 70–99)
Potassium: 4 meq/L (ref 3.5–5.1)
Sodium: 142 meq/L (ref 135–145)
Total Bilirubin: 0.5 mg/dL (ref 0.2–1.2)
Total Protein: 6.4 g/dL (ref 6.0–8.3)

## 2023-03-12 LAB — LIPID PANEL
Cholesterol: 137 mg/dL (ref 0–200)
HDL: 44.8 mg/dL (ref >39.00)
LDL Cholesterol: 69 mg/dL (ref 0–99)
NonHDL: 91.92
Total CHOL/HDL Ratio: 3
Triglycerides: 114 mg/dL (ref 0.0–149.0)
VLDL: 22.8 mg/dL (ref 0.0–40.0)

## 2023-03-14 ENCOUNTER — Other Ambulatory Visit: Payer: Self-pay

## 2023-03-14 ENCOUNTER — Encounter: Payer: Self-pay | Admitting: Family Medicine

## 2023-03-14 ENCOUNTER — Other Ambulatory Visit (HOSPITAL_COMMUNITY): Payer: Self-pay

## 2023-03-14 ENCOUNTER — Other Ambulatory Visit: Payer: Self-pay | Admitting: Family Medicine

## 2023-03-14 DIAGNOSIS — F419 Anxiety disorder, unspecified: Secondary | ICD-10-CM

## 2023-03-14 MED ORDER — ALPRAZOLAM 0.25 MG PO TABS
0.2500 mg | ORAL_TABLET | Freq: Three times a day (TID) | ORAL | 2 refills | Status: DC | PRN
Start: 2023-03-14 — End: 2024-04-20
  Filled 2023-03-14: qty 30, 10d supply, fill #0

## 2023-03-14 NOTE — Telephone Encounter (Signed)
Requesting: Xanax Contract: N/A UDS: N/A Last Visit: 12/18/2022 Next Visit: N/A Last Refill: 08/09/2022   Please Advise

## 2023-03-16 ENCOUNTER — Other Ambulatory Visit (HOSPITAL_COMMUNITY): Payer: Self-pay

## 2023-03-19 NOTE — Progress Notes (Signed)
[facility] Summary Report / Loop Recorder 

## 2023-04-04 ENCOUNTER — Other Ambulatory Visit (HOSPITAL_COMMUNITY): Payer: Self-pay

## 2023-04-08 ENCOUNTER — Ambulatory Visit (INDEPENDENT_AMBULATORY_CARE_PROVIDER_SITE_OTHER): Payer: No Typology Code available for payment source

## 2023-04-08 DIAGNOSIS — I639 Cerebral infarction, unspecified: Secondary | ICD-10-CM | POA: Diagnosis not present

## 2023-04-09 LAB — CUP PACEART REMOTE DEVICE CHECK
Date Time Interrogation Session: 20240818230637
Implantable Pulse Generator Implant Date: 20231219

## 2023-04-10 ENCOUNTER — Telehealth: Payer: Self-pay | Admitting: Neurology

## 2023-04-10 ENCOUNTER — Emergency Department (HOSPITAL_BASED_OUTPATIENT_CLINIC_OR_DEPARTMENT_OTHER)
Admission: EM | Admit: 2023-04-10 | Discharge: 2023-04-10 | Disposition: A | Discharge status: Home / Self Care | Payer: No Typology Code available for payment source | Attending: Emergency Medicine | Admitting: Emergency Medicine

## 2023-04-10 ENCOUNTER — Ambulatory Visit: Payer: No Typology Code available for payment source

## 2023-04-10 ENCOUNTER — Emergency Department (HOSPITAL_BASED_OUTPATIENT_CLINIC_OR_DEPARTMENT_OTHER): Payer: No Typology Code available for payment source

## 2023-04-10 ENCOUNTER — Encounter (HOSPITAL_BASED_OUTPATIENT_CLINIC_OR_DEPARTMENT_OTHER): Payer: Self-pay | Admitting: Emergency Medicine

## 2023-04-10 ENCOUNTER — Ambulatory Visit
Admission: RE | Admit: 2023-04-10 | Discharge: 2023-04-10 | Disposition: A | Discharge status: Home / Self Care | Payer: No Typology Code available for payment source | Source: Ambulatory Visit | Attending: Family Medicine | Admitting: Family Medicine

## 2023-04-10 DIAGNOSIS — Z7902 Long term (current) use of antithrombotics/antiplatelets: Secondary | ICD-10-CM | POA: Diagnosis not present

## 2023-04-10 DIAGNOSIS — R200 Anesthesia of skin: Secondary | ICD-10-CM | POA: Diagnosis not present

## 2023-04-10 DIAGNOSIS — Z1231 Encounter for screening mammogram for malignant neoplasm of breast: Secondary | ICD-10-CM

## 2023-04-10 DIAGNOSIS — Z8673 Personal history of transient ischemic attack (TIA), and cerebral infarction without residual deficits: Secondary | ICD-10-CM | POA: Diagnosis not present

## 2023-04-10 DIAGNOSIS — I6381 Other cerebral infarction due to occlusion or stenosis of small artery: Secondary | ICD-10-CM | POA: Diagnosis not present

## 2023-04-10 DIAGNOSIS — R42 Dizziness and giddiness: Secondary | ICD-10-CM | POA: Insufficient documentation

## 2023-04-10 DIAGNOSIS — Q2112 Patent foramen ovale: Secondary | ICD-10-CM | POA: Diagnosis not present

## 2023-04-10 DIAGNOSIS — I639 Cerebral infarction, unspecified: Secondary | ICD-10-CM

## 2023-04-10 DIAGNOSIS — R9431 Abnormal electrocardiogram [ECG] [EKG]: Secondary | ICD-10-CM | POA: Diagnosis not present

## 2023-04-10 LAB — HEPATIC FUNCTION PANEL
ALT: 26 U/L (ref 0–44)
AST: 24 U/L (ref 15–41)
Albumin: 4 g/dL (ref 3.5–5.0)
Alkaline Phosphatase: 77 U/L (ref 38–126)
Bilirubin, Direct: 0.1 mg/dL (ref 0.0–0.2)
Indirect Bilirubin: 0.4 mg/dL (ref 0.3–0.9)
Total Bilirubin: 0.5 mg/dL (ref 0.3–1.2)
Total Protein: 7.3 g/dL (ref 6.5–8.1)

## 2023-04-10 LAB — PROTIME-INR
INR: 1 (ref 0.8–1.2)
Prothrombin Time: 12.9 seconds (ref 11.4–15.2)

## 2023-04-10 LAB — CBC WITH DIFFERENTIAL/PLATELET
Abs Immature Granulocytes: 0.02 10*3/uL (ref 0.00–0.07)
Basophils Absolute: 0 10*3/uL (ref 0.0–0.1)
Basophils Relative: 1 %
Eosinophils Absolute: 0.1 10*3/uL (ref 0.0–0.5)
Eosinophils Relative: 2 %
HCT: 39.8 % (ref 36.0–46.0)
Hemoglobin: 13.3 g/dL (ref 12.0–15.0)
Immature Granulocytes: 0 %
Lymphocytes Relative: 51 %
Lymphs Abs: 2.9 10*3/uL (ref 0.7–4.0)
MCH: 29.2 pg (ref 26.0–34.0)
MCHC: 33.4 g/dL (ref 30.0–36.0)
MCV: 87.3 fL (ref 80.0–100.0)
Monocytes Absolute: 0.6 10*3/uL (ref 0.1–1.0)
Monocytes Relative: 10 %
Neutro Abs: 2.1 10*3/uL (ref 1.7–7.7)
Neutrophils Relative %: 36 %
Platelets: 242 10*3/uL (ref 150–400)
RBC: 4.56 MIL/uL (ref 3.87–5.11)
RDW: 14.7 % (ref 11.5–15.5)
WBC: 5.7 10*3/uL (ref 4.0–10.5)
nRBC: 0 % (ref 0.0–0.2)

## 2023-04-10 LAB — BASIC METABOLIC PANEL
Anion gap: 8 (ref 5–15)
BUN: 12 mg/dL (ref 8–23)
CO2: 27 mmol/L (ref 22–32)
Calcium: 8.9 mg/dL (ref 8.9–10.3)
Chloride: 105 mmol/L (ref 98–111)
Creatinine, Ser: 0.9 mg/dL (ref 0.44–1.00)
GFR, Estimated: >60 mL/min (ref >60)
Glucose, Bld: 94 mg/dL (ref 70–99)
Potassium: 3.7 mmol/L (ref 3.5–5.1)
Sodium: 140 mmol/L (ref 135–145)

## 2023-04-10 MED ORDER — GADOBUTROL 1 MMOL/ML IV SOLN
10.0000 mL | Freq: Once | INTRAVENOUS | Status: AC | PRN
  Administered 2023-04-10: 10 mL via INTRAVENOUS

## 2023-04-10 NOTE — ED Notes (Signed)
Patient transported to CT 

## 2023-04-10 NOTE — Telephone Encounter (Signed)
This does not sounds like TIA or stroke.  I recommend that she go to urgent care for evaluation, with her position change, it could be vertigo.

## 2023-04-10 NOTE — Telephone Encounter (Signed)
Called patient and left a message for a call back.  

## 2023-04-10 NOTE — ED Provider Notes (Signed)
Care of patient received from prior provider at 7:01 PM, please see their note for complete H/P and care plan.  Received handoff per ED course.  Clinical Course as of 04/10/23 1901  Wed Apr 10, 2023  1452 Stable HO from [facility] 60 YOF with a chief complaint of dizziness. CVA last year on MRI (PFO) Longstanding sxs but worse since 0900 with facial numbness too. MRI MRA today.  [CC]    Clinical Course User Index [CC] Glyn Ade, MD    Reassessment: Chief complaint of dizziness now improved.  MRA shows vessel stenosis.  Will consult neurology for further recommendations.  Patient would like to be discharged does not want to be admitted and wants to return to work.  Since her symptoms are improving a believe this is reasonable.  Consulted neurology for recommendations on management of this vessel stenosis.  Reassessment Consulted neurology.  Dr. Iver Nestle responded.  She agreed that this is likely subacute findings based on prior CTAs recommended continue follow-up with neurology in the outpatient setting. Disposition:  I have considered need for hospitalization, however, considering all of the above, I believe this patient is stable for discharge at this time.  Patient/family educated about specific return precautions for given chief complaint and symptoms.  Patient/family educated about follow-up with PCP/NEuro.     Patient/family expressed understanding of return precautions and need for follow-up. Patient spoken to regarding all imaging and laboratory results and appropriate follow up for these results. All education provided in verbal form with additional information in written form. Time was allowed for answering of patient questions. Patient discharged.    Emergency Department Medication Summary:   Medications  gadobutrol (GADAVIST) 1 MMOL/ML injection 10 mL (10 mLs Intravenous Contrast Given 04/10/23 1622)          Glyn Ade, MD 04/10/23 2007

## 2023-04-10 NOTE — Telephone Encounter (Signed)
Pt is calling in stating that when she tilts her head down she get real dizzy this has been going on for the last 10 minutes and has not subsided. Pt stated that she was instructed to let us know when this happen since she had a stroke.  Pt is already scheduled for 06/04/2023 at 9:30.

## 2023-04-10 NOTE — ED Provider Notes (Addendum)
[facility] Provider Note   CSN: [id number] Arrival date & time: 04/10/23  1317     History  Chief Complaint  Patient presents with   Dizziness    Emily Osborne is a 62 y.o. female.  HPI   62 year old female with past medical history of previous embolic CVA presents to the emergency department with ongoing dizziness and left-sided mouth numbness.  Patient states while she got in her car this morning at 9 AM (over 4 hours ago) she started feeling dizzy.  Patient has history of dizziness that is intermittent but is never been this severe persistent.  Since 9 AM it has been consistent.  She feels off balance but not necessarily like a room spinning sensation.  It is worse with certain movements.  No associated headache, vision loss, facial droop, speech change or focal weakness.  She states that the beginning she did have some numbness to the left side of the corner of the mouth but that is currently resolved.  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  She follows with cardiology.  This has not been repaired.  Otherwise she is been in her baseline health and compliant with medications.  Home Medications Prior to Admission medications   Medication Sig Start Date End Date Taking? Authorizing Provider  acetaminophen (TYLENOL) 500 MG tablet Take 1,000 mg by mouth every 6 (six) hours as needed for moderate pain.   Yes [provider]  acetaminophen (TYLENOL) 650 MG CR tablet Take 650 mg by mouth as needed for pain.   Yes [provider]  albuterol (VENTOLIN HFA) 108 (90 Base) MCG/ACT inhaler Inhale 2 puffs into the lungs every 4 (four) hours as needed for wheezing or shortness of breath. 05/25/22  Yes Nyoka Cowden, MD  ALPRAZolam Prudy Feeler) 0.25 MG tablet Take 1 tablet (0.25 mg total) by mouth 3 (three)  times daily as needed. Patient taking differently: Take 0.25 mg by mouth as needed for anxiety. 03/14/23  Yes Seabron Spates R, DO  atorvastatin (LIPITOR) 40 MG tablet Take 1 tablet (40 mg) by mouth daily. 03/01/23  Yes [name], DO  Biotin 2.5 MG CAPS Take 2.5 mg by mouth every other day.   Yes [provider]  Blood Glucose Monitoring Suppl (FREESTYLE FREEDOM) KIT 1 Device by Does not apply route daily. 06/08/14  Yes Seabron Spates R, DO  budesonide-formoterol (SYMBICORT) 80-4.5 MCG/ACT inhaler Inhale 2 puffs into the lungs 2 (two) times daily, in the morning and 12 hours later. 07/06/22  Yes Nyoka Cowden, MD  clopidogrel (PLAVIX) 75 MG tablet Take 1 tablet (75 mg total) by mouth daily. 12/05/22  Yes Patel, Donika K, DO  diclofenac sodium (VOLTAREN) 1 % GEL Apply 2 g topically 4 (four) times daily. Rub into affected area of foot 2 to 4 times daily Patient taking differently: Apply 2 g topically 4 (four) times daily as needed (pain). Rub into affected area of foot 2 to 4 times daily 10/02/18  Yes Vivi Barrack, DPM  EPINEPHrine (EPIPEN 2-PAK) 0.3 mg/0.3 mL IJ SOAJ injection Inject 0.3 mg into the muscle as needed for anaphylaxis. 12/18/22  Yes Seabron Spates R, DO  famotidine (PEPCID) 20 MG tablet Take 1 tablet (20 mg total) by mouth daily after supper. 12/18/22  Yes Zola Button,  Yvonne R, DO  fluticasone (FLONASE) 50 MCG/ACT nasal spray PLACE 2 SPRAYS INTO BOTH NOSTRILS DAILY. 01/28/23  Yes Seabron Spates R, DO  glucose blood (FREESTYLE LITE) test strip USE TO CHECK BLOOD SUGAR ONCE A DAY 09/11/21  Yes [name], DO  HYDROcodone-acetaminophen (NORCO) 7.5-325 MG tablet Take 1 tablet by mouth every four to six hours as needed for pain Patient taking differently: Take 1 tablet by mouth as needed for moderate pain. 03/05/23  Yes   levocetirizine (XYZAL) 5 MG tablet Take 1 tablet (5 mg total) by mouth every evening. Patient taking differently: Take 5  mg by mouth in the morning. 01/02/23  Yes Seabron Spates R, DO  lidocaine (LIDODERM) 5 % Apply to the affected area once daily as needed for a week. **Remove patch after 12 hours** 01/29/22  Yes Lowne Florina Ou R, DO  methimazole (TAPAZOLE) 5 MG tablet Take 1 tablet (5 mg total) by mouth every other day. 11/13/22  Yes Carlus Pavlov, MD  pantoprazole (PROTONIX) 40 MG tablet Take 1 tablet (40 mg total) by mouth 1-2 times a day. Must keep appointment in June for refills 12/18/22  Yes [name], DO  Probiotic Product (ALIGN) 4 MG CAPS Take 1 capsule (4 mg total) by mouth daily. 08/01/21  Yes Seabron Spates R, DO  sucralfate (CARAFATE) 1 g tablet Take 1 tablet by mouth every 6 hours as needed. Patient taking differently: Take 1 g by mouth as needed (stomach pain). 12/18/22  Yes [name], DO  valACYclovir (VALTREX) 1000 MG tablet Take 1 tablet (1,000 mg total) by mouth 3 (three) times daily as needed. Patient taking differently: Take 1,000 mg by mouth 3 (three) times daily as needed (fever blisters). 06/28/21  Yes [name], DO  amoxicillin (AMOXIL) 500 MG capsule Take 2 capsules by mouth now then take 1 capule 4 times daily until finished Patient not taking: Reported on 04/10/2023 02/14/23     famotidine (PEPCID) 20 MG tablet Take 1 tablet (20 mg) by mouth daily after supper Patient not taking: Reported on 04/10/2023 01/28/23   Meredith Pel, NP  pantoprazole (PROTONIX) 40 MG tablet Take 1 tablet (40 mg) by mouth 1 - 2 times daily Patient not taking: Reported on 04/10/2023 01/28/23   Meredith Pel, NP      Allergies    Aspirin, Gentamicin, Ibuprofen, Metronidazole, Nsaids, Doxycycline, Influenza a (h1n1) monoval vac, Loratadine, Metformin and related, Periactin [cyproheptadine], Ranitidine hcl, Sulfamethoxazole-trimethoprim, and Tramadol hcl    Review of Systems   Review of Systems  Constitutional:  Negative for fever.  Respiratory:  Negative for  shortness of breath.   Cardiovascular:  Negative for chest pain.  Gastrointestinal:  Negative for abdominal pain, diarrhea and vomiting.  Skin:  Negative for rash.  Neurological:  Positive for dizziness. Negative for facial asymmetry, speech difficulty, weakness, numbness and headaches.    Physical Exam Updated Vital Signs BP 139/86 (BP Location: Right Arm)   Pulse 63   Temp 97.7 F (36.5 C)   Resp 20   Ht 5\' 5"  (1.651 m)   Wt 108.9 kg   SpO2 100%   BMI 39.94 kg/m  Physical Exam Vitals and nursing note reviewed.  Constitutional:      General: She is not in acute distress.    Appearance: Normal appearance.  HENT:     Head: Normocephalic.     Mouth/Throat:     Mouth: Mucous membranes are moist.  Eyes:     Extraocular Movements: Extraocular movements intact.     Pupils: Pupils are equal, round, and reactive to light.  Cardiovascular:     Rate and Rhythm: Normal rate.  Pulmonary:     Effort: Pulmonary effort is normal. No respiratory distress.  Abdominal:     Palpations: Abdomen is soft.     Tenderness: There is no abdominal tenderness.  Skin:    General: Skin is warm.  Neurological:     General: No focal deficit present.     Mental Status: She is alert and oriented to person, place, and time. Mental status is at baseline.     Cranial Nerves: No cranial nerve deficit.     Comments: Nonfocal neuroexam, no noted ataxia.  Complaining of the dizziness currently during exam, worse with head movement.  Psychiatric:        Mood and Affect: Mood normal.     ED Results / Procedures / Treatments   Labs (all labs ordered are listed, but only abnormal results are displayed) Labs Reviewed  CBC WITH DIFFERENTIAL/PLATELET  PROTIME-INR  HEPATIC FUNCTION PANEL  BASIC METABOLIC PANEL    EKG EKG Interpretation Date/Time:  Wednesday April 10 2023 13:26:50 EDT Ventricular Rate:  64 PR Interval:  166 QRS Duration:  102 QT Interval:  392 QTC Calculation: 405 R  Axis:   2  Text Interpretation: Sinus rhythm Low voltage, precordial leads Borderline T abnormalities, anterior leads Similar to previous Confirmed by Coralee Pesa [phone]) on 04/10/2023 1:29:02 PM  Radiology No results found.  Procedures Procedures    Medications Ordered in ED Medications - No data to display  ED Course/ Medical Decision Making/ A&P Clinical Course as of 04/10/23 1454  Wed Apr 10, 2023  1452 Stable HO from [facility] 34 YOF with a chief complaint of dizziness. CVA last year on MRI (PFO) Longstanding sxs but worse since 0900 with facial numbness too. MRI MRA today.  [CC]    Clinical Course User Index [CC] Glyn Ade, MD                                 Medical Decision Making Amount and/or Complexity of Data Reviewed Labs: ordered. Radiology: ordered.  Risk Prescription drug management.   62 year old female presents emergency department with concern for ongoing dizziness.  History of dizziness in the past that is usually self resolved.  However back in November 2023 when she had similar tingling to the left side of her face she was noted to have embolic stroke on MRI.  Possible persistent PFO, follows as an outpatient.  Symptoms onset 900am, over 4 hours ago, no stroke page. LVO negative.  Vitals are normal and stable on arrival.  She is nonfocal on her neuroexam but complaining of ongoing dizziness, worse with head movement.  Numbness to the left side of the mouth/face has resolved.  Will evaluate with labs and start with a head CT.  Presumed the patient will ultimately need MRI with vessel imaging.  Patient signed out pending results.        Final Clinical Impression(s) / ED Diagnoses Final diagnoses:  None    Rx / DC Orders ED Discharge Orders     None         Rozelle Logan, DO 04/10/23 1506    Makya Phillis, Clabe Seal, DO 04/10/23 1629

## 2023-04-10 NOTE — ED Triage Notes (Signed)
Pt c/o dizziness intermittently x 1 month, but started at 0900 toady and has not subsided; also reports slight numbness to LT side of mouth and chin since 1000; hx of stroke 06/2023

## 2023-04-10 NOTE — ED Notes (Signed)
EDP has  been in to see the Pt.

## 2023-04-11 NOTE — Telephone Encounter (Signed)
Called patient and left a message for a call back.  

## 2023-04-15 ENCOUNTER — Other Ambulatory Visit (HOSPITAL_COMMUNITY): Payer: Self-pay

## 2023-04-15 MED ORDER — TRIAMCINOLONE ACETONIDE 0.1 % EX OINT
TOPICAL_OINTMENT | CUTANEOUS | 3 refills | Status: AC
  Filled 2023-04-15: qty 454, 30d supply, fill #0

## 2023-04-16 ENCOUNTER — Encounter: Payer: Self-pay | Admitting: Neurology

## 2023-04-16 ENCOUNTER — Other Ambulatory Visit (HOSPITAL_BASED_OUTPATIENT_CLINIC_OR_DEPARTMENT_OTHER): Payer: Self-pay

## 2023-04-16 ENCOUNTER — Other Ambulatory Visit (HOSPITAL_COMMUNITY): Payer: Self-pay

## 2023-04-16 ENCOUNTER — Ambulatory Visit: Payer: No Typology Code available for payment source | Admitting: Neurology

## 2023-04-16 VITALS — BP 146/91 | HR 66 | Ht 65.0 in | Wt 252.0 lb

## 2023-04-16 DIAGNOSIS — R42 Dizziness and giddiness: Secondary | ICD-10-CM

## 2023-04-16 DIAGNOSIS — I6501 Occlusion and stenosis of right vertebral artery: Secondary | ICD-10-CM

## 2023-04-16 DIAGNOSIS — I679 Cerebrovascular disease, unspecified: Secondary | ICD-10-CM

## 2023-04-16 MED ORDER — MECLIZINE HCL 12.5 MG PO TABS
12.5000 mg | ORAL_TABLET | Freq: Two times a day (BID) | ORAL | 1 refills | Status: AC | PRN
  Filled 2023-04-16: qty 30, 15d supply, fill #0

## 2023-04-16 NOTE — Patient Instructions (Signed)
Start meclizine 12.5mg  as needed for dizziness  We will refer you to Interventional Radiology to discuss cerebral angiogram

## 2023-04-16 NOTE — Progress Notes (Unsigned)
Follow-up Visit   Date: 04/16/2023    Emily Osborne MRN: [id number] DOB: September 08, 1960    Emily Osborne is a 62 y.o. right-handed African American female with fibromyalgia, chronic headaches, hypertension, hyperlipidemia, vitamin B12 deficiency, and Graves disease returning to the clinic for hospital discharge follow-up for stroke.  The patient was accompanied to the clinic by self.   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  No LVO.  TEE with positive R > L shunt concerning for small PFO.  Loop recorder placed in December 2023. If there is no arrhythmia on monitoring, PFO closure may be considered in the future.  - Continue plavix 75mg  daily  - Continue secondary risk factor modification.  Continue lipitor 40mg  (LDL 95) and BP management.  BP is better controlled today  (134/92).  2.  Tension headaches, occurring 1-2 times per month - OK to treat as needed with tylenol  Return to clinic in 6 months    --------------------------------------------- UPDATE 07/25/2022: She was admitted on 06/22/2022 with stroke after presenting with left sided weakness/numbness and headache.  MRI brain shows scattered punctate ischemic infarcts bilaterally.  TEE showed normal EF, mild LVH.  CTA head and neck did not show large vessel occlusion or stenosis, there was note of diffuse tortuosity. TEE noted + bubble with R>L shunt.   She will be having loop recorder implanted later this month and if there is no atrial fibrillation present on monitoring, the next step would be PFO closure. She denies any ongoing weakness and feels back to her baseline.  She continues to have headaches about 1-2 times per month over the top of her head.  She typically treats this with tylenol and it resolved within a few minutes.    She is a nonsmoker.  No history of diabetes.  She takes meformin for weight loss.    UPDATE 11/28/2022:  She is here for follow-up visit.   She reports  having some word-finding difficulty and has some word substitution, which has been ongoing since her stroke.  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  She completed PT but continues to have pain and achiness in the left arm.  She denies weakness.   No new neurological symptoms.   She continues to have headaches a few times per month, which improves with tylenol.   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  UPDATE 04/16/2023:  For the past month, she has been getting spells of dizziness and sensation as if she could pass out.  Symptoms last 20-min. She went to the ER last week because she has associated numbness/tingling of the left face, which lasted 2.5 hour. She had MRI brain which did not show acute stroke.  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 ***  She notices that symptoms are worse if she rotates her head or looks down.  She has occasional nausea and imbalance.     Medications:  Current Outpatient Medications on File Prior to Visit  Medication Sig Dispense Refill   acetaminophen (TYLENOL) 500 MG tablet Take 1,000 mg by mouth every 6 (six) hours as needed for moderate pain.     acetaminophen (TYLENOL) 650 MG CR tablet Take 650 mg by mouth as needed for pain.     albuterol (VENTOLIN HFA) 108 (90 Base) MCG/ACT inhaler Inhale 2 puffs into the lungs every 4 (four) hours as needed for wheezing or shortness  of breath. 18 g 2   ALPRAZolam (XANAX) 0.25 MG tablet Take 1 tablet (0.25 mg total) by mouth 3 (three) times daily as needed. (Patient taking differently: Take 0.25 mg by mouth as needed for anxiety.) 30 tablet 2   atorvastatin (LIPITOR) 40 MG tablet Take 1 tablet (40 mg) by mouth daily. 90 tablet 1   Biotin 2.5 MG CAPS Take 2.5 mg by mouth every other day.     Blood Glucose Monitoring Suppl (FREESTYLE FREEDOM) KIT 1 Device by Does not apply route daily. 1 each 0   budesonide-formoterol (SYMBICORT) 80-4.5 MCG/ACT  inhaler Inhale 2 puffs into the lungs 2 (two) times daily, in the morning and 12 hours later. 10.2 g 11   clopidogrel (PLAVIX) 75 MG tablet Take 1 tablet (75 mg total) by mouth daily. 90 tablet 3   diclofenac sodium (VOLTAREN) 1 % GEL Apply 2 g topically 4 (four) times daily. Rub into affected area of foot 2 to 4 times daily (Patient taking differently: Apply 2 g topically 4 (four) times daily as needed (pain). Rub into affected area of foot 2 to 4 times daily) 100 g 2   EPINEPHrine (EPIPEN 2-PAK) 0.3 mg/0.3 mL IJ SOAJ injection Inject 0.3 mg into the muscle as needed for anaphylaxis. 2 each 2   famotidine (PEPCID) 20 MG tablet Take 1 tablet (20 mg total) by mouth daily after supper. 30 tablet 11   famotidine (PEPCID) 20 MG tablet Take 1 tablet (20 mg) by mouth daily after supper 30 tablet 11   fluticasone (FLONASE) 50 MCG/ACT nasal spray PLACE 2 SPRAYS INTO BOTH NOSTRILS DAILY. 16 g 5   glucose blood (FREESTYLE LITE) test strip USE TO CHECK BLOOD SUGAR ONCE A DAY 100 each 1   HYDROcodone-acetaminophen (NORCO) 7.5-325 MG tablet Take 1 tablet by mouth every four to six hours as needed for pain (Patient taking differently: Take 1 tablet by mouth as needed for moderate pain.) 12 tablet 0   levocetirizine (XYZAL) 5 MG tablet Take 1 tablet (5 mg total) by mouth every evening. (Patient taking differently: Take 5 mg by mouth in the morning.) 90 tablet 3   lidocaine (LIDODERM) 5 % Apply to the affected area once daily as needed for a week. **Remove patch after 12 hours** 30 patch 0   methimazole (TAPAZOLE) 5 MG tablet Take 1 tablet (5 mg total) by mouth every other day. 45 tablet 1   pantoprazole (PROTONIX) 40 MG tablet Take 1 tablet (40 mg total) by mouth 1-2 times a day. Must keep appointment in June for refills 60 tablet 1   pantoprazole (PROTONIX) 40 MG tablet Take 1 tablet (40 mg) by mouth 1 - 2 times daily 60 tablet 11   Probiotic Product (ALIGN) 4 MG CAPS Take 1 capsule (4 mg total) by mouth daily. 90  capsule 3   sucralfate (CARAFATE) 1 g tablet Take 1 tablet by mouth every 6 hours as needed. (Patient taking differently: Take 1 g by mouth as needed (stomach pain).) 60 tablet 3   triamcinolone ointment (KENALOG) 0.1 % Apply as directed to affected area twice a day 454 g 3   valACYclovir (VALTREX) 1000 MG tablet Take 1 tablet (1,000 mg total) by mouth 3 (three) times daily as needed. (Patient taking differently: Take 1,000 mg by mouth 3 (three) times daily as needed (fever blisters).) 30 tablet 2   amoxicillin (AMOXIL) 500 MG capsule Take 2 capsules by mouth now then take 1 capule 4 times daily until finished (Patient  not taking: Reported on 04/10/2023) 24 capsule 0   No current facility-administered medications on file prior to visit.    Allergies:  Allergies  Allergen Reactions   Aspirin Anaphylaxis   Gentamicin Other (See Comments)    Eye drops turned the sclera bright red   Ibuprofen Anaphylaxis   Metronidazole Swelling and Other (See Comments)    Red face/swelling   Nsaids Anaphylaxis   Doxycycline Nausea And Vomiting and Other (See Comments)    Severe nausea/vomiting   Influenza A (H1n1) Monoval Vac Other (See Comments)    Sick for 3 weeks   Loratadine Other (See Comments)    Fatigue/weakness   Metformin And Related Other (See Comments)    Memory issues and headache   Periactin [Cyproheptadine] Other (See Comments)    Paranoid    Ranitidine Hcl Other (See Comments)    Lips turned red /peel   Sulfamethoxazole-Trimethoprim Other (See Comments)    Face red/peel   Tramadol Hcl Other (See Comments)    Paranoid    Vital Signs:  BP (!) 146/91   Pulse 66   Ht 5\' 5"  (1.651 m)   Wt 252 lb (114.3 kg)   SpO2 99%   BMI 41.93 kg/m   Neurological Exam: MENTAL STATUS including orientation to time, place, person, recent and remote memory, attention span and concentration, language, and fund of knowledge is normal.  Speech is not dysarthric.  CRANIAL NERVES:   Pupils equal round  and reactive to light.  Normal conjugate, extra-ocular eye movements in all directions of gaze.  No ptosis.  Face is symmetric. Palate elevates symmetrically.  Tongue is midline.  MOTOR:  Motor strength is 5/5 in all extremities.  No atrophy, fasciculations or abnormal movements.  No pronator drift.  Tone is normal.    MSRs:  Reflexes are 2+/4 throughout.  SENSORY:  Intact to vibration throughout.  COORDINATION/GAIT:  Normal finger-to- nose-finger.  Intact rapid alternating movements bilaterally.  Gait narrow based and stable.   Data: Lab Results  Component Value Date   HGBA1C 6.2 12/18/2022   Lab Results  Component Value Date   CHOL 137 03/12/2023   HDL 44.80 03/12/2023   LDLCALC 69 03/12/2023   LDLDIRECT 139.4 12/26/2011   TRIG 114.0 03/12/2023   CHOLHDL 3 03/12/2023    MRI brain wo contrast 06/22/2022: 1. Few punctate acute ischemic infarcts involving the cortical gray matter of both cerebral hemispheres, presumably embolic. No associated hemorrhage or mass effect. 2. Underlying mild chronic microvascular ischemic disease, stable.  CTA head and neck 06/22/2022:  Negative CTA of the head and neck. No large vessel occlusion or other emergent finding. No hemodynamically significant or correctable stenosis. 2. Diffuse tortuosity of the major arterial vasculature of the head and neck, suggesting chronic underlying hypertension.   TEE 07/09/2022: 1. Left ventricular ejection fraction, by estimation, is 55 to 60%. The  left ventricle has normal function. The left ventricle has no regional  wall motion abnormalities.   2. Right ventricular systolic function is normal. The right ventricular  size is normal.   3. No left atrial/left atrial appendage thrombus was detected. The LAA  emptying velocity was 79 cm/s.   4. The mitral valve is normal in structure. Mild mitral valve  regurgitation. No evidence of mitral stenosis.   5. The aortic valve is tricuspid. Aortic valve  regurgitation is not  visualized. No aortic stenosis is present.   6. Agitated saline contrast bubble study was positive with shunting  observed within 3-6 cardiac  cycles suggestive of interatrial shunt. There  is a small patent foramen ovale with predominantly right to left shunting  across the atrial septum.    MRI brain wo contrast 04/10/2023: 1. No evidence of an acute intracranial abnormality. 2. Cerebellar atrophy, chronic small vessel ischemic disease and chronic infarcts, as described.  MRA head and neck 04/10/2023: 1. The common carotid and internal carotid arteries are patent within the neck without stenosis. 2. The vertebral arteries are patent within the neck. Apparent severe stenosis at the right vertebral artery origin. However, this apparent stenosis could potentially be exaggerated by vessel tortuosity (no stenosis was present at this site on the prior CTA head/neck of 06/22/2022).    Thank you for allowing me to participate in patient's care.  If I can answer any additional questions, I would be pleased to do so.    Sincerely,    Easter Kennebrew K. Allena Katz, DO

## 2023-04-17 ENCOUNTER — Ambulatory Visit: Payer: No Typology Code available for payment source | Admitting: Internal Medicine

## 2023-04-17 ENCOUNTER — Encounter: Payer: Self-pay | Admitting: Internal Medicine

## 2023-04-17 ENCOUNTER — Other Ambulatory Visit (HOSPITAL_COMMUNITY): Payer: Self-pay

## 2023-04-17 VITALS — BP 130/80 | HR 87 | Ht 65.0 in | Wt 251.8 lb

## 2023-04-17 DIAGNOSIS — E0500 Thyrotoxicosis with diffuse goiter without thyrotoxic crisis or storm: Secondary | ICD-10-CM

## 2023-04-17 MED ORDER — METHIMAZOLE 5 MG PO TABS
5.0000 mg | ORAL_TABLET | ORAL | 1 refills | Status: DC
Start: 2023-04-17 — End: 2023-10-17
  Filled 2023-04-17 – 2023-08-16 (×2): qty 45, 90d supply, fill #0

## 2023-04-17 NOTE — Progress Notes (Addendum)
She patient ID: Emily Osborne, female   DOB: 1961-06-04, 62 y.o.   MRN: [id number]  HPI  Emily Osborne is a 62 y.o.-year-old female, returning for follow-up for Graves' disease.  Last visit 6 months ago.  Interim history: At today's visit, she denies palpitations, tremors, weight loss, anxiety, heat intolerance.  She does mention continuing weight gain. On 04/10/2023 she was in the emergency room for dizziness.  She has a history of CVA in 06/2022 - has a loop recorder placed 08/2022.  She refused an admission at that time as her dizziness was improving. She saw neurology yesterday. 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 She may need an angiogram and possibly also a stent. She started Meclizine - but this causes somnolence.  Reviewed and addended history: She has a history of Graves' disease based on elevated TSI antibodies in 2017.  We initially started methimazole 5 mg daily and subsequent TFTs have normalized.  She was the low dose of methimazole, 2.5 mg daily >> stopped 10/2017.  We also started atenolol 25 mg twice a day and she was able to come off but we had restart it as she had intermittent chest pain.  She previously mentioned that she developed chest pain if she stopped this (unclear if placebo component).  She had a cardiac cath in 09/2016 showing no pathology. She saw cardiology 08/2018 >> heart monitor for 2 weeks: one very brief episode that looked a little irregular, but it happened in the middle of the night and only lasted about two seconds.  This was considered normal variant.  She came off atenolol after 09/2018  In 01/2022, a TSH returned undetectable at her visit with PCP.  This was confirmed a month later.  She had fatigue, weakness, anxiety, tremors, palpitations, fast HR (110 bpm), 20 lbs weight loss, no appetite, mental fog, frequent stools, chills. At that time, she had a lot of stress at work.  PCP got in touch with me and she was started on methimazole in  02/2022 - 5 mg 2 times a day, along with atenolol. She decreased the dose to 5 mg 1x a day and stopped atenolol in 04/2022.  She mentions that at that time, she developed lower extremity edema, which improved since then.  She gained ~10 lbs back before our visit in 05/2022.  In 12/2022, we decreased MMI 5 mg MWF.  I reviewed pt's thyroid tests:  Lab Results  Component Value Date   TSH 0.66 02/27/2023   TSH 0.91 01/01/2023   TSH 2.43 11/13/2022   TSH 1.09 07/24/2022   TSH 0.46 06/15/2022   TSH 0.01 (L) 05/08/2022   TSH <0.01 (L) 03/09/2022   TSH <0.01 (L) 01/23/2022   TSH 0.71 12/11/2021   TSH 0.79 02/02/2021   FREET4 0.72 02/27/2023   FREET4 0.87 01/01/2023   FREET4 0.82 11/13/2022   FREET4 0.69 07/24/2022   FREET4 0.67 06/15/2022   FREET4 0.76 10/07/2018   FREET4 0.75 11/13/2017   FREET4 0.76 05/07/2017   FREET4 0.78 10/05/2016   FREET4 0.74 06/28/2016   T3FREE 3.2 02/27/2023   T3FREE 2.9 01/01/2023   T3FREE 2.7 11/13/2022   T3FREE 3.4 07/24/2022   T3FREE 3.2 06/15/2022   T3FREE 3.1 10/07/2018   T3FREE 3.3 11/13/2017   T3FREE 2.9 05/07/2017   T3FREE 3.2 10/05/2016   T3FREE 2.8 06/28/2016   Antithyroid antibodies: Lab Results  Component Value Date   TSI 449 (H) 06/15/2022   TSI <89 05/07/2017   TSI  393 (H) 12/27/2015   Pt denies: - feeling nodules in neck - hoarseness - dysphagia - choking  Pt does have a FH of thyroid ds. In mother and sister. No FH of thyroid cancer. No h/o radiation tx to head or neck. No recent steroid use. No herbal supplements. + Biotin use -5000 mcg every other day.  Last dose today.  Pt. also has a history of vitamin B12 deficiency, history of sigmoidectomy due to diverticulitis, mild diastolic dysfunction, obesity, memory loss, hyperlipidemia, GERD, HTN, prediabetes: Lab Results  Component Value Date   HGBA1C 6.2 12/18/2022   HGBA1C 5.6 06/23/2022   HGBA1C 6.1 12/11/2021   HGBA1C 6.0 02/02/2021   HGBA1C 5.9 11/17/2020   HGBA1C  5.8 08/31/2019   HGBA1C 5.8 06/11/2019   HGBA1C 6.0 12/11/2018   HGBA1C 5.8 08/07/2018   HGBA1C 6.0 05/13/2018  She has a small PFO.  ROS: Constitutional: + see HPI  Past Medical History:  Diagnosis Date   Allergy    Anxiety    Asthma    pt has inhaler   Back pain    C. difficile colitis    Cataract    bil eyes   Chest pain    Chronic fatigue syndrome    Clostridium difficile colitis 12/13/2016   Diverticulitis    Dyspnea    Dysrhythmia    palpitations   Edema    Elevated blood pressure reading without diagnosis of hypertension    "just elevated when I'm in pain" (12/07/2015)   Fatty liver    Fibromyalgia    Ganglion of joint    right wrist   GERD (gastroesophageal reflux disease)    H. pylori infection    Headache    hx of   Helicobacter pylori (H. pylori) infection 12/13/2016   Herpes zoster without mention of complication    HTN (hypertension)    Hyperglycemia 08/07/2018   Hyperlipidemia    Hyperthyroidism    IBS (irritable bowel syndrome)    Leg edema    Multiple drug allergies 06/18/2017   Myalgia    Nontraumatic rupture of Achilles tendon    Other acute reactions to stress    Pain in joint, shoulder region    Pain in joint, upper arm    Pain in limb    Palpitations    Personal history of other diseases of digestive system    Pneumonia    once   PONV (postoperative nausea and vomiting)    Routine screening for STI (sexually transmitted infection) 12/13/2016   S/P partial colectomy 06/18/2017   Thyroid disease    hyperthyroidism   Vitamin B 12 deficiency    Past Surgical History:  Procedure Laterality Date   ABDOMINAL HYSTERECTOMY     ACHILLES TENDON REPAIR Right 2005   BUBBLE STUDY  07/09/2022   Procedure: BUBBLE STUDY;  Surgeon: Jodelle Red, MD;  Location: [facility] ENDOSCOPY;  Service: Cardiovascular;;   BUNIONECTOMY Bilateral    Bunionectomy 1983   COLON SURGERY  01/23/2017   6 to 8 inches sigmoid colon removed   COLONOSCOPY      GANGLION CYST EXCISION Right    wrist   LEFT HEART CATH AND CORONARY ANGIOGRAPHY N/A 10/03/2016   Procedure: Left Heart Cath and Coronary Angiography;  Surgeon: Kathleene Hazel, MD;  Location: [facility] INVASIVE CV LAB;  Service: Cardiovascular;  Laterality: N/A;   MENISCUS REPAIR Right 04/2014   POLYPECTOMY     TEE WITHOUT CARDIOVERSION N/A 07/09/2022   Procedure: TRANSESOPHAGEAL ECHOCARDIOGRAM (TEE);  Surgeon:  Jodelle Red, MD;  Location: [facility] ENDOSCOPY;  Service: Cardiovascular;  Laterality: N/A;   TUBAL LIGATION     wisdomteeth extraction     Social History   Socioeconomic History   Marital status: Divorced    Spouse name: Not on file   Number of children: 2   Years of education: Not on file   Highest education level: Not on file  Occupational History   Occupation: Nurse/teacher  Tobacco Use   Smoking status: Never   Smokeless tobacco: Never  Vaping Use   Vaping status: Never Used  Substance and Sexual Activity   Alcohol use: No    Alcohol/week: 0.0 standard drinks of alcohol   Drug use: No   Sexual activity: Not Currently    Partners: Male  Other Topics Concern   Not on file  Social History Narrative   Lives with alone.  She has two daughter.   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   Right Handed   Social Determinants of Corporate investment banker Strain: Not on file  Food Insecurity: Not on file  Transportation Needs: Not on file  Physical Activity: Not on file  Stress: Not on file  Social Connections: Not on file  Intimate Partner Violence: Not on file   Current Outpatient Medications on File Prior to Visit  Medication Sig Dispense Refill   acetaminophen (TYLENOL) 500 MG tablet Take 1,000 mg by mouth every 6 (six) hours as needed for moderate pain.     acetaminophen (TYLENOL) 650 MG CR tablet Take 650 mg by mouth as needed for pain.     albuterol (VENTOLIN HFA) 108 (90 Base) MCG/ACT inhaler Inhale 2 puffs into the lungs every 4  (four) hours as needed for wheezing or shortness of breath. 18 g 2   ALPRAZolam (XANAX) 0.25 MG tablet Take 1 tablet (0.25 mg total) by mouth 3 (three) times daily as needed. (Patient taking differently: Take 0.25 mg by mouth as needed for anxiety.) 30 tablet 2   amoxicillin (AMOXIL) 500 MG capsule Take 2 capsules by mouth now then take 1 capule 4 times daily until finished (Patient not taking: Reported on 04/10/2023) 24 capsule 0   atorvastatin (LIPITOR) 40 MG tablet Take 1 tablet (40 mg) by mouth daily. 90 tablet 1   Biotin 2.5 MG CAPS Take 2.5 mg by mouth every other day.     Blood Glucose Monitoring Suppl (FREESTYLE FREEDOM) KIT 1 Device by Does not apply route daily. 1 each 0   budesonide-formoterol (SYMBICORT) 80-4.5 MCG/ACT inhaler Inhale 2 puffs into the lungs 2 (two) times daily, in the morning and 12 hours later. 10.2 g 11   clopidogrel (PLAVIX) 75 MG tablet Take 1 tablet (75 mg total) by mouth daily. 90 tablet 3   diclofenac sodium (VOLTAREN) 1 % GEL Apply 2 g topically 4 (four) times daily. Rub into affected area of foot 2 to 4 times daily (Patient taking differently: Apply 2 g topically 4 (four) times daily as needed (pain). Rub into affected area of foot 2 to 4 times daily) 100 g 2   EPINEPHrine (EPIPEN 2-PAK) 0.3 mg/0.3 mL IJ SOAJ injection Inject 0.3 mg into the muscle as needed for anaphylaxis. 2 each 2   famotidine (PEPCID) 20 MG tablet Take 1 tablet (20 mg total) by mouth daily after supper. 30 tablet 11   famotidine (PEPCID) 20 MG tablet Take 1 tablet (20 mg) by mouth daily after supper 30 tablet 11  fluticasone (FLONASE) 50 MCG/ACT nasal spray PLACE 2 SPRAYS INTO BOTH NOSTRILS DAILY. 16 g 5   glucose blood (FREESTYLE LITE) test strip USE TO CHECK BLOOD SUGAR ONCE A DAY 100 each 1   HYDROcodone-acetaminophen (NORCO) 7.5-325 MG tablet Take 1 tablet by mouth every four to six hours as needed for pain (Patient taking differently: Take 1 tablet by mouth as needed for moderate pain.) 12  tablet 0   levocetirizine (XYZAL) 5 MG tablet Take 1 tablet (5 mg total) by mouth every evening. (Patient taking differently: Take 5 mg by mouth in the morning.) 90 tablet 3   lidocaine (LIDODERM) 5 % Apply to the affected area once daily as needed for a week. **Remove patch after 12 hours** 30 patch 0   meclizine (ANTIVERT) 12.5 MG tablet Take 1 tablet (12.5 mg total) by mouth 2 (two) times daily as needed for dizziness. 30 tablet 1   methimazole (TAPAZOLE) 5 MG tablet Take 1 tablet (5 mg total) by mouth every other day. 45 tablet 1   pantoprazole (PROTONIX) 40 MG tablet Take 1 tablet (40 mg total) by mouth 1-2 times a day. Must keep appointment in June for refills 60 tablet 1   pantoprazole (PROTONIX) 40 MG tablet Take 1 tablet (40 mg) by mouth 1 - 2 times daily 60 tablet 11   Probiotic Product (ALIGN) 4 MG CAPS Take 1 capsule (4 mg total) by mouth daily. 90 capsule 3   sucralfate (CARAFATE) 1 g tablet Take 1 tablet by mouth every 6 hours as needed. (Patient taking differently: Take 1 g by mouth as needed (stomach pain).) 60 tablet 3   triamcinolone ointment (KENALOG) 0.1 % Apply as directed to affected area twice a day 454 g 3   valACYclovir (VALTREX) 1000 MG tablet Take 1 tablet (1,000 mg total) by mouth 3 (three) times daily as needed. (Patient taking differently: Take 1,000 mg by mouth 3 (three) times daily as needed (fever blisters).) 30 tablet 2   No current facility-administered medications on file prior to visit.   Allergies  Allergen Reactions   Aspirin Anaphylaxis   Gentamicin Other (See Comments)    Eye drops turned the sclera bright red   Ibuprofen Anaphylaxis   Metronidazole Swelling and Other (See Comments)    Red face/swelling   Nsaids Anaphylaxis   Doxycycline Nausea And Vomiting and Other (See Comments)    Severe nausea/vomiting   Influenza A (H1n1) Monoval Vac Other (See Comments)    Sick for 3 weeks   Loratadine Other (See Comments)    Fatigue/weakness   Metformin  And Related Other (See Comments)    Memory issues and headache   Periactin [Cyproheptadine] Other (See Comments)    Paranoid    Ranitidine Hcl Other (See Comments)    Lips turned red /peel   Sulfamethoxazole-Trimethoprim Other (See Comments)    Face red/peel   Tramadol Hcl Other (See Comments)    Paranoid   Family History  Problem Relation Age of Onset   Hyperlipidemia Mother    Hypertension Mother        Died, 69   Thyroid disease Mother        Thyroid surgery   Obesity Mother    Heart disease Mother    Hypertension Father    Diabetes Father    Heart disease Father    Kidney disease Father        Died, 60   Hyperlipidemia Father    Pernicious anemia Sister    Thyroid  disease Sister        On thyroid Rx   Atrial fibrillation Sister    Leukemia Brother    Cancer Brother        myleoblastic anemia   Coronary artery disease Brother    Hypertension Brother    Lupus Daughter    Colon cancer Neg Hx    Esophageal cancer Neg Hx    Rectal cancer Neg Hx    Stomach cancer Neg Hx    Pancreatic cancer Neg Hx    Prostate cancer Neg Hx    Colon polyps Neg Hx    PE: BP 130/80   Pulse 87   Ht 5\' 5"  (1.651 m)   Wt 251 lb 12.8 oz (114.2 kg)   SpO2 98%   BMI 41.90 kg/m  Wt Readings from Last 3 Encounters:  04/17/23 251 lb 12.8 oz (114.2 kg)  04/16/23 252 lb (114.3 kg)  04/10/23 240 lb (108.9 kg)   Constitutional: overweight, in NAD Eyes:  EOMI, no exophthalmos ENT: no neck masses, no cervical lymphadenopathy Cardiovascular: RRR, No MRG Respiratory: CTA B Musculoskeletal: no deformities Skin: No rashes  Neurological: no tremor with outstretched hands  ASSESSMENT: 1.  Graves disease - recurrent  PLAN:  1. Patient with a history of Graves' disease, diagnosed based on elevated TSI antibodies, resolved before our visit in 09/2018, but with recurrence in 2023.  She was initially on methimazole, which we were able to stop in 10/2017 with subsequently normal TFTs and  undetectable TSI's and in 01/2022, when she started to feel poorly: Fatigue, weakness, anxiety, tremors, palpitations, 20 pound weight loss, mental fog, frequent stools, and chills.  She had an undetectable TSH and the free thyroid hormones were in the thyrotoxic range.  This pattern was confirmed 1 month later.  She was restarted on methimazole 5 mg twice a day, on which TFTs improved, so she decreased the methimazole dose by herself 2 weeks prior to our appointment from 05/2022 to 5 mg daily.  She felt well on this dose with the majority of the symptoms resolving.  TSI's were elevated.  However, TFTs normalized, and remained normal since then. -In 01/2023 she contacted me with weight gain.  We rechecked her TFTs in 02/2023 and they were normal.   -She continues methimazole 5 mg 3x a week since 12/2022 -At today's visit, she feels well, without thyrotoxic symptoms, but has dizziness and weight gain  - 13 lbs since last OV -We will recheck her TFTs in a week, since she just took a large dose of biotin this morning, and change the methimazole dose accordingly.  We will also add TSI antibodies. - We did discuss about other modalities of treatment for Graves' disease to include RAI treatment or, last resort, surgery, but she responded well to methimazole so for now we decided to continue with this -We do not need to add back beta-blockers since she is not tachycardic or tremulous. -No signs of active Graves' after myopathy: No double vision, blurry vision, eye pain, chemosis -No neck compression symptoms or masses felt on palpation of her neck today -I will see her back in 6 months, possibly sooner for labs  Orders Placed This Encounter  Procedures   TSH   T4, free   T3, free   Thyroid stimulating immunoglobulin   I refilled her methimazole prescription.  Component     Latest Ref Rng 04/24/2023  TSH     0.35 - 5.50 uIU/mL 1.00   Triiodothyronine,Free,Serum  2.3 - 4.2 pg/mL 3.2    T4,Free(Direct)     0.60 - 1.60 ng/dL 9.14   TFTs are normal.  TSI's are still pending.  Carlus Pavlov, MD PhD [facility] Endocrinology

## 2023-04-17 NOTE — Patient Instructions (Addendum)
Please come back for labs in 1 week.  Please continue methimazole 5 mg 3x a week.  Please come back for a follow-up appointment in 6 months.

## 2023-04-19 NOTE — Progress Notes (Signed)
[facility] Summary Report / Loop Recorder 

## 2023-04-24 ENCOUNTER — Other Ambulatory Visit (INDEPENDENT_AMBULATORY_CARE_PROVIDER_SITE_OTHER): Payer: No Typology Code available for payment source

## 2023-04-24 DIAGNOSIS — E0500 Thyrotoxicosis with diffuse goiter without thyrotoxic crisis or storm: Secondary | ICD-10-CM

## 2023-04-24 LAB — T3, FREE: T3, Free: 3.2 pg/mL (ref 2.3–4.2)

## 2023-04-24 LAB — TSH: TSH: 1 u[IU]/mL (ref 0.35–5.50)

## 2023-04-24 LAB — T4, FREE: Free T4: 0.83 ng/dL (ref 0.60–1.60)

## 2023-04-25 ENCOUNTER — Telehealth: Payer: Self-pay | Admitting: Neurology

## 2023-04-25 NOTE — Telephone Encounter (Signed)
Patient is calling about appointment that was suppose to be made for Angiogram . Patient is wanting to know should she call and make the appointment

## 2023-04-25 NOTE — Telephone Encounter (Signed)
I left detailed message that referral was just sent 04/16/2023 pending review.To call in one week if no call for an appt.

## 2023-04-26 ENCOUNTER — Other Ambulatory Visit (HOSPITAL_COMMUNITY): Payer: Self-pay

## 2023-04-27 LAB — THYROID STIMULATING IMMUNOGLOBULIN: TSI: 97 %{baseline} (ref <140)

## 2023-04-29 ENCOUNTER — Ambulatory Visit (INDEPENDENT_AMBULATORY_CARE_PROVIDER_SITE_OTHER): Payer: No Typology Code available for payment source | Admitting: Family Medicine

## 2023-04-29 ENCOUNTER — Other Ambulatory Visit (HOSPITAL_COMMUNITY): Payer: Self-pay

## 2023-04-29 ENCOUNTER — Encounter: Payer: Self-pay | Admitting: Family Medicine

## 2023-04-29 VITALS — BP 132/80 | HR 79 | Temp 98.2°F | Resp 18 | Ht 65.0 in | Wt 247.4 lb

## 2023-04-29 DIAGNOSIS — E785 Hyperlipidemia, unspecified: Secondary | ICD-10-CM | POA: Diagnosis not present

## 2023-04-29 DIAGNOSIS — I639 Cerebral infarction, unspecified: Secondary | ICD-10-CM

## 2023-04-29 DIAGNOSIS — R600 Localized edema: Secondary | ICD-10-CM

## 2023-04-29 DIAGNOSIS — Z7985 Long-term (current) use of injectable non-insulin antidiabetic drugs: Secondary | ICD-10-CM

## 2023-04-29 DIAGNOSIS — I10 Essential (primary) hypertension: Secondary | ICD-10-CM | POA: Diagnosis not present

## 2023-04-29 DIAGNOSIS — E1169 Type 2 diabetes mellitus with other specified complication: Secondary | ICD-10-CM | POA: Diagnosis not present

## 2023-04-29 DIAGNOSIS — Z6841 Body Mass Index (BMI) 40.0 and over, adult: Secondary | ICD-10-CM

## 2023-04-29 DIAGNOSIS — Z8673 Personal history of transient ischemic attack (TIA), and cerebral infarction without residual deficits: Secondary | ICD-10-CM

## 2023-04-29 MED ORDER — WEGOVY 0.25 MG/0.5ML ~~LOC~~ SOAJ
0.2500 mg | SUBCUTANEOUS | 0 refills | Status: DC
  Filled 2023-04-29 (×2): qty 2, 28d supply, fill #0

## 2023-04-29 MED ORDER — FUROSEMIDE 40 MG PO TABS
40.0000 mg | ORAL_TABLET | Freq: Every day | ORAL | 3 refills | Status: DC
Start: 2023-04-29 — End: 2024-06-25
  Filled 2023-04-29: qty 90, 90d supply, fill #0
  Filled 2023-11-24: qty 90, 90d supply, fill #1
  Filled 2024-03-02: qty 90, 90d supply, fill #2

## 2023-04-29 NOTE — Progress Notes (Signed)
Established Patient Office Visit  Subjective   Patient ID: Emily Osborne, female    DOB: 1960/11/21  Age: 62 y.o. MRN: [id number]  Chief Complaint  Patient presents with   Weight Gain    HPI Discussed the use of AI scribe software for clinical note transcription with the patient, who gave verbal consent to proceed.  History of Present Illness   The patient reports gaining two to three pounds every few days despite not eating excessively. They have been taking methimazole for their thyroid condition three times a week and have recently restarted taking a B12 elixir due to feeling fatigued. They also report that their right leg swells, but it is unclear if this is a new or ongoing issue.  In addition to these concerns, the patient has been experiencing dizziness. They had a recent episode of severe dizziness that led to an emergency department visit where they underwent an MRI and were diagnosed with vertebral stenosis. They have been prescribed meclizine for the dizziness, which they take as needed.  The patient is also concerned about their weight gain and is interested in weight loss medication. They have previously attended a weight loss clinic but did not find the program helpful. They express a willingness to try injectable weight loss medications.       Review of Systems  Constitutional:  Negative for chills, fever and malaise/fatigue.  HENT:  Negative for congestion and hearing loss.   Eyes:  Negative for blurred vision and discharge.  Respiratory:  Negative for cough, sputum production and shortness of breath.   Cardiovascular:  Negative for chest pain, palpitations and leg swelling.  Gastrointestinal:  Negative for abdominal pain, blood in stool, constipation, diarrhea, heartburn, nausea and vomiting.  Genitourinary:  Negative for dysuria, frequency,  hematuria and urgency.  Musculoskeletal:  Negative for back pain, falls and myalgias.  Skin:  Negative for rash.  Neurological:  Negative for dizziness, sensory change, loss of consciousness, weakness and headaches.  Endo/Heme/Allergies:  Negative for environmental allergies. Does not bruise/bleed easily.  Psychiatric/Behavioral:  Negative for depression and suicidal ideas. The patient is not nervous/anxious and does not have insomnia.       Objective:     BP 132/80 (BP Location: Right Arm, Patient Position: Sitting, Cuff Size: Large)   Pulse 79   Temp 98.2 F (36.8 C) (Oral)   Resp 18   Ht 5\' 5"  (1.651 m)   Wt 247 lb 6.4 oz (112.2 kg)   SpO2 97%   BMI 41.17 kg/m  BP Readings from Last 3 Encounters:  04/29/23 132/80  04/17/23 130/80  04/16/23 (!) 146/91   Wt Readings from Last 3 Encounters:  04/29/23 247 lb 6.4 oz (112.2 kg)  04/17/23 251 lb 12.8 oz (114.2 kg)  04/16/23 252 lb (114.3 kg)   SpO2 Readings from Last 3 Encounters:  04/29/23 97%  04/17/23 98%  04/16/23 99%      Physical Exam Vitals and nursing note reviewed.  Constitutional:      General: She is not in acute distress.    Appearance: Normal appearance. She is well-developed.  HENT:     Head: Normocephalic and atraumatic.  Eyes:     General: No scleral icterus.       Right eye: No discharge.        Left eye: No discharge.  Cardiovascular:     Rate and Rhythm: Normal rate and regular rhythm.     Heart sounds: No murmur heard. Pulmonary:  Effort: Pulmonary effort is normal. No respiratory distress.     Breath sounds: Normal breath sounds.  Musculoskeletal:        General: Normal range of motion.     Cervical back: Normal range of motion and neck supple.     Right lower leg: No edema.     Left lower leg: No edema.  Skin:    General: Skin is warm and dry.  Neurological:     Mental Status: She is alert and oriented to person, place, and time.  Psychiatric:        Mood and Affect: Mood normal.         Behavior: Behavior normal.        Thought Content: Thought content normal.        Judgment: Judgment normal.      No results found for any visits on 04/29/23.  Last CBC Lab Results  Component Value Date   WBC 5.7 04/10/2023   HGB 13.3 04/10/2023   HCT 39.8 04/10/2023   MCV 87.3 04/10/2023   MCH 29.2 04/10/2023   RDW 14.7 04/10/2023   PLT 242 04/10/2023   Last metabolic panel Lab Results  Component Value Date   GLUCOSE 94 04/10/2023   NA 140 04/10/2023   K 3.7 04/10/2023   CL 105 04/10/2023   CO2 27 04/10/2023   BUN 12 04/10/2023   CREATININE 0.90 04/10/2023   GFRNONAA >60 04/10/2023   CALCIUM 8.9 04/10/2023   PROT 7.3 04/10/2023   ALBUMIN 4.0 04/10/2023   LABGLOB 2.4 03/20/2018   AGRATIO 1.8 03/20/2018   BILITOT 0.5 04/10/2023   ALKPHOS 77 04/10/2023   AST 24 04/10/2023   ALT 26 04/10/2023   ANIONGAP 8 04/10/2023   Last lipids Lab Results  Component Value Date   CHOL 137 03/12/2023   HDL 44.80 03/12/2023   LDLCALC 69 03/12/2023   LDLDIRECT 139.4 12/26/2011   TRIG 114.0 03/12/2023   CHOLHDL 3 03/12/2023   Last hemoglobin A1c Lab Results  Component Value Date   HGBA1C 6.2 12/18/2022   Last thyroid functions Lab Results  Component Value Date   TSH 1.00 04/24/2023   T3TOTAL 139 12/08/2015   T4TOTAL 6.9 05/08/2022      The ASCVD Risk score (Arnett DK, et al., 2019) failed to calculate for the following reasons:   The patient has a prior MI or stroke diagnosis    Assessment & Plan:   Problem List Items Addressed This Visit       Unprioritized   Primary hypertension   Relevant Medications   furosemide (LASIX) 40 MG tablet   Other Relevant Orders   CBC with Differential/Platelet   Comprehensive metabolic panel   Lipid panel   TSH   Hyperlipidemia associated with type 2 diabetes mellitus (HCC) - Primary   Relevant Medications   furosemide (LASIX) 40 MG tablet   Semaglutide-Weight Management (WEGOVY) 0.25 MG/0.5ML SOAJ   Other  Relevant Orders   Comprehensive metabolic panel   Lipid panel   Cerebrovascular accident (CVA) (HCC)   Relevant Medications   furosemide (LASIX) 40 MG tablet   Semaglutide-Weight Management (WEGOVY) 0.25 MG/0.5ML SOAJ   Other Visit Diagnoses     Morbid obesity (HCC)       Relevant Medications   Semaglutide-Weight Management (WEGOVY) 0.25 MG/0.5ML SOAJ   Other Relevant Orders   CBC with Differential/Platelet   Comprehensive metabolic panel   Lipid panel   TSH   VITAMIN D 25 Hydroxy (Vit-D Deficiency, Fractures)  Vitamin B12   Lower extremity edema       Relevant Medications   furosemide (LASIX) 40 MG tablet     Assessment and Plan    Hyperthyroidism Stable on Methimazole 3 times a week. No recent symptoms of hyperthyroidism. -Continue Methimazole as prescribed.  Weight Gain Unexplained weight gain despite normal thyroid function. Patient has tried weight loss clinic without success. -Attempt to get Wegovy (weight loss medication) covered by insurance due to history of stroke. -If successful, start on 0.25mg  once a week for a month.  Vitamin B12 and D Deficiency Patient reports fatigue and has been taking B12 elixir as needed. Not currently on Vitamin D supplement. -Check B12 and Vitamin D levels.  Lower Extremity Edema Right leg swelling. Patient has been taking Lasix 40mg  as needed. -Increase Lasix to daily for 1-2 weeks, then return to as needed dosing.  Vertebral Stenosis Recent diagnosis after episode of dizziness. Patient is awaiting consultation with radiology for possible angiogram and stenting. -Continue to follow up with neurology and radiology as planned.  General Health Maintenance -Continue Meclizine as needed for dizziness. -Consider Weight Watchers or similar program for weight loss support.         Return in about 3 months (around 07/29/2023).    [name], DO

## 2023-04-29 NOTE — Patient Instructions (Signed)
Calorie Counting for Weight Loss Calories are units of energy. Your body needs a certain number of calories from food to keep going throughout the day. When you eat or drink more calories than your body needs, your body stores the extra calories mostly as fat. When you eat or drink fewer calories than your body needs, your body burns fat to get the energy it needs. Calorie counting means keeping track of how many calories you eat and drink each day. Calorie counting can be helpful if you need to lose weight. If you eat fewer calories than your body needs, you should lose weight. Ask your health care provider what a healthy weight is for you. For calorie counting to work, you will need to eat the right number of calories each day to lose a healthy amount of weight per week. A dietitian can help you figure out how many calories you need in a day and will suggest ways to reach your calorie goal. A healthy amount of weight to lose each week is usually 1-2 lb (0.5-0.9 kg). This usually means that your daily calorie intake should be reduced by 500-750 calories. Eating 1,200-1,500 calories a day can help most women lose weight. Eating 1,500-1,800 calories a day can help most men lose weight. What do I need to know about calorie counting? Work with your health care provider or dietitian to determine how many calories you should get each day. To meet your daily calorie goal, you will need to: Find out how many calories are in each food that you would like to eat. Try to do this before you eat. Decide how much of the food you plan to eat. Keep a food log. Do this by writing down what you ate and how many calories it had. To successfully lose weight, it is important to balance calorie counting with a healthy lifestyle that includes regular activity. Where do I find calorie information?  The number of calories in a food can be found on a Nutrition Facts label. If a food does not have a Nutrition Facts label, try  to look up the calories online or ask your dietitian for help. Remember that calories are listed per serving. If you choose to have more than one serving of a food, you will have to multiply the calories per serving by the number of servings you plan to eat. For example, the label on a package of bread might say that a serving size is 1 slice and that there are 90 calories in a serving. If you eat 1 slice, you will have eaten 90 calories. If you eat 2 slices, you will have eaten 180 calories. How do I keep a food log? After each time that you eat, record the following in your food log as soon as possible: What you ate. Be sure to include toppings, sauces, and other extras on the food. How much you ate. This can be measured in cups, ounces, or number of items. How many calories were in each food and drink. The total number of calories in the food you ate. Keep your food log near you, such as in a pocket-sized notebook or on an app or website on your mobile phone. Some programs will calculate calories for you and show you how many calories you have left to meet your daily goal. What are some portion-control tips? Know how many calories are in a serving. This will help you know how many servings you can have of a certain   food. Use a measuring cup to measure serving sizes. You could also try weighing out portions on a kitchen scale. With time, you will be able to estimate serving sizes for some foods. Take time to put servings of different foods on your favorite plates or in your favorite bowls and cups so you know what a serving looks like. Try not to eat straight from a food's packaging, such as from a bag or box. Eating straight from the package makes it hard to see how much you are eating and can lead to overeating. Put the amount you would like to eat in a cup or on a plate to make sure you are eating the right portion. Use smaller plates, glasses, and bowls for smaller portions and to prevent  overeating. Try not to multitask. For example, avoid watching TV or using your computer while eating. If it is time to eat, sit down at a table and enjoy your food. This will help you recognize when you are full. It will also help you be more mindful of what and how much you are eating. What are tips for following this plan? Reading food labels Check the calorie count compared with the serving size. The serving size may be smaller than what you are used to eating. Check the source of the calories. Try to choose foods that are high in protein, fiber, and vitamins, and low in saturated fat, trans fat, and sodium. Shopping Read nutrition labels while you shop. This will help you make healthy decisions about which foods to buy. Pay attention to nutrition labels for low-fat or fat-free foods. These foods sometimes have the same number of calories or more calories than the full-fat versions. They also often have added sugar, starch, or salt to make up for flavor that was removed with the fat. Make a grocery list of lower-calorie foods and stick to it. Cooking Try to cook your favorite foods in a healthier way. For example, try baking instead of frying. Use low-fat dairy products. Meal planning Use more fruits and vegetables. One-half of your plate should be fruits and vegetables. Include lean proteins, such as chicken, turkey, and fish. Lifestyle Each week, aim to do one of the following: 150 minutes of moderate exercise, such as walking. 75 minutes of vigorous exercise, such as running. General information Know how many calories are in the foods you eat most often. This will help you calculate calorie counts faster. Find a way of tracking calories that works for you. Get creative. Try different apps or programs if writing down calories does not work for you. What foods should I eat?  Eat nutritious foods. It is better to have a nutritious, high-calorie food, such as an avocado, than a food with  few nutrients, such as a bag of potato chips. Use your calories on foods and drinks that will fill you up and will not leave you hungry soon after eating. Examples of foods that fill you up are nuts and nut butters, vegetables, lean proteins, and high-fiber foods such as whole grains. High-fiber foods are foods with more than 5 g of fiber per serving. Pay attention to calories in drinks. Low-calorie drinks include water and unsweetened drinks. The items listed above may not be a complete list of foods and beverages you can eat. Contact a dietitian for more information. What foods should I limit? Limit foods or drinks that are not good sources of vitamins, minerals, or protein or that are high in unhealthy fats. These   include: Candy. Other sweets. Sodas, specialty coffee drinks, alcohol, and juice. The items listed above may not be a complete list of foods and beverages you should avoid. Contact a dietitian for more information. How do I count calories when eating out? Pay attention to portions. Often, portions are much larger when eating out. Try these tips to keep portions smaller: Consider sharing a meal instead of getting your own. If you get your own meal, eat only half of it. Before you start eating, ask for a container and put half of your meal into it. When available, consider ordering smaller portions from the menu instead of full portions. Pay attention to your food and drink choices. Knowing the way food is cooked and what is included with the meal can help you eat fewer calories. If calories are listed on the menu, choose the lower-calorie options. Choose dishes that include vegetables, fruits, whole grains, low-fat dairy products, and lean proteins. Choose items that are boiled, broiled, grilled, or steamed. Avoid items that are buttered, battered, fried, or served with cream sauce. Items labeled as crispy are usually fried, unless stated otherwise. Choose water, low-fat milk,  unsweetened iced tea, or other drinks without added sugar. If you want an alcoholic beverage, choose a lower-calorie option, such as a glass of wine or light beer. Ask for dressings, sauces, and syrups on the side. These are usually high in calories, so you should limit the amount you eat. If you want a salad, choose a garden salad and ask for grilled meats. Avoid extra toppings such as bacon, cheese, or fried items. Ask for the dressing on the side, or ask for olive oil and vinegar or lemon to use as dressing. Estimate how many servings of a food you are given. Knowing serving sizes will help you be aware of how much food you are eating at restaurants. Where to find more information Centers for Disease Control and Prevention: www.cdc.gov U.[place]. Department of Agriculture: myplate.gov Summary Calorie counting means keeping track of how many calories you eat and drink each day. If you eat fewer calories than your body needs, you should lose weight. A healthy amount of weight to lose per week is usually 1-2 lb (0.5-0.9 kg). This usually means reducing your daily calorie intake by 500-750 calories. The number of calories in a food can be found on a Nutrition Facts label. If a food does not have a Nutrition Facts label, try to look up the calories online or ask your dietitian for help. Use smaller plates, glasses, and bowls for smaller portions and to prevent overeating. Use your calories on foods and drinks that will fill you up and not leave you hungry shortly after a meal. This information is not intended to replace advice given to you by your health care provider. Make sure you discuss any questions you have with your health care provider. Document Revised: 09/17/2019 Document Reviewed: 09/17/2019 Elsevier Patient Education  2023 Elsevier Inc.  

## 2023-04-30 ENCOUNTER — Other Ambulatory Visit (HOSPITAL_COMMUNITY): Payer: Self-pay

## 2023-04-30 LAB — COMPREHENSIVE METABOLIC PANEL
ALT: 27 U/L (ref 0–35)
AST: 24 U/L (ref 0–37)
Albumin: 4.5 g/dL (ref 3.5–5.2)
Alkaline Phosphatase: 87 U/L (ref 39–117)
BUN: 11 mg/dL (ref 6–23)
CO2: 33 meq/L — ABNORMAL HIGH (ref 19–32)
Calcium: 9.8 mg/dL (ref 8.4–10.5)
Chloride: 103 meq/L (ref 96–112)
Creatinine, Ser: 0.96 mg/dL (ref 0.40–1.20)
GFR: 63.07 mL/min (ref >60.00)
Glucose, Bld: 79 mg/dL (ref 70–99)
Potassium: 4 meq/L (ref 3.5–5.1)
Sodium: 142 meq/L (ref 135–145)
Total Bilirubin: 0.5 mg/dL (ref 0.2–1.2)
Total Protein: 7.5 g/dL (ref 6.0–8.3)

## 2023-04-30 LAB — VITAMIN D 25 HYDROXY (VIT D DEFICIENCY, FRACTURES): VITD: 23.4 ng/mL — ABNORMAL LOW (ref 30.00–100.00)

## 2023-04-30 LAB — CBC WITH DIFFERENTIAL/PLATELET
Basophils Absolute: 0 10*3/uL (ref 0.0–0.1)
Basophils Relative: 0.6 % (ref 0.0–3.0)
Eosinophils Absolute: 0.1 10*3/uL (ref 0.0–0.7)
Eosinophils Relative: 2.4 % (ref 0.0–5.0)
HCT: 43.5 % (ref 36.0–46.0)
Hemoglobin: 13.9 g/dL (ref 12.0–15.0)
Lymphocytes Relative: 42.1 % (ref 12.0–46.0)
Lymphs Abs: 2.3 10*3/uL (ref 0.7–4.0)
MCHC: 31.9 g/dL (ref 30.0–36.0)
MCV: 89.8 fl (ref 78.0–100.0)
Monocytes Absolute: 0.6 10*3/uL (ref 0.1–1.0)
Monocytes Relative: 10 % (ref 3.0–12.0)
Neutro Abs: 2.5 10*3/uL (ref 1.4–7.7)
Neutrophils Relative %: 44.9 % (ref 43.0–77.0)
Platelets: 266 10*3/uL (ref 150.0–400.0)
RBC: 4.84 Mil/uL (ref 3.87–5.11)
RDW: 14.7 % (ref 11.5–15.5)
WBC: 5.5 10*3/uL (ref 4.0–10.5)

## 2023-04-30 LAB — LIPID PANEL
Cholesterol: 160 mg/dL (ref 0–200)
HDL: 55.4 mg/dL (ref >39.00)
LDL Cholesterol: 84 mg/dL (ref 0–99)
NonHDL: 104.78
Total CHOL/HDL Ratio: 3
Triglycerides: 105 mg/dL (ref 0.0–149.0)
VLDL: 21 mg/dL (ref 0.0–40.0)

## 2023-04-30 LAB — TSH: TSH: 0.97 u[IU]/mL (ref 0.35–5.50)

## 2023-04-30 LAB — VITAMIN B12: Vitamin B-12: >1501 pg/mL — ABNORMAL HIGH (ref 211–911)

## 2023-05-01 ENCOUNTER — Other Ambulatory Visit: Payer: Self-pay

## 2023-05-01 ENCOUNTER — Other Ambulatory Visit (HOSPITAL_COMMUNITY): Payer: Self-pay

## 2023-05-01 MED ORDER — VITAMIN D (ERGOCALCIFEROL) 1.25 MG (50000 UNIT) PO CAPS
50000.0000 [IU] | ORAL_CAPSULE | ORAL | 1 refills | Status: DC
  Filled 2023-05-01: qty 12, 84d supply, fill #0
  Filled 2023-07-15: qty 12, 84d supply, fill #1

## 2023-05-03 ENCOUNTER — Other Ambulatory Visit (HOSPITAL_COMMUNITY): Payer: Self-pay

## 2023-05-06 ENCOUNTER — Other Ambulatory Visit (HOSPITAL_BASED_OUTPATIENT_CLINIC_OR_DEPARTMENT_OTHER): Payer: Self-pay

## 2023-05-07 ENCOUNTER — Telehealth: Payer: Self-pay

## 2023-05-07 NOTE — Telephone Encounter (Signed)
Pharmacy Patient Advocate Encounter   Received notification from CoverMyMeds that prior authorization for WEGOVY 0.25 MG/0.5 ML PEN is required/requested.   Insurance verification completed.   The patient is insured through [facility] .   Per test claim: PA required; PA submitted to [facility] via Prompt PA Key/confirmation #/EOC [id number] Status is pending

## 2023-05-09 NOTE — Telephone Encounter (Signed)
Pharmacy Patient Advocate Encounter  Received notification from [employer] that Prior Authorization for [facility] 0.25MG /0.5ML has been DENIED.  See denial reason below. No denial letter attached in CMM. Will attache denial letter to Media tab once received.   PA #/Case ID/Reference #: [id number]

## 2023-05-13 ENCOUNTER — Ambulatory Visit (INDEPENDENT_AMBULATORY_CARE_PROVIDER_SITE_OTHER): Payer: No Typology Code available for payment source

## 2023-05-13 DIAGNOSIS — I639 Cerebral infarction, unspecified: Secondary | ICD-10-CM | POA: Diagnosis not present

## 2023-05-14 LAB — CUP PACEART REMOTE DEVICE CHECK
Date Time Interrogation Session: 20240920230449
Implantable Pulse Generator Implant Date: 20231219

## 2023-05-16 ENCOUNTER — Encounter: Payer: Self-pay | Admitting: Family Medicine

## 2023-05-17 ENCOUNTER — Other Ambulatory Visit (HOSPITAL_COMMUNITY): Payer: Self-pay

## 2023-05-17 ENCOUNTER — Other Ambulatory Visit: Payer: Self-pay | Admitting: Family

## 2023-05-17 DIAGNOSIS — E1169 Type 2 diabetes mellitus with other specified complication: Secondary | ICD-10-CM

## 2023-05-17 DIAGNOSIS — I639 Cerebral infarction, unspecified: Secondary | ICD-10-CM

## 2023-05-17 MED ORDER — WEGOVY 0.25 MG/0.5ML ~~LOC~~ SOAJ
0.5000 mg | SUBCUTANEOUS | 1 refills | Status: DC
Start: 2023-05-17 — End: 2023-10-16
  Filled 2023-05-17: qty 4, 28d supply, fill #0

## 2023-05-17 NOTE — Telephone Encounter (Signed)
Should we increase to the next dose?

## 2023-05-18 ENCOUNTER — Other Ambulatory Visit (HOSPITAL_COMMUNITY): Payer: Self-pay

## 2023-05-22 ENCOUNTER — Other Ambulatory Visit: Payer: Self-pay

## 2023-05-22 ENCOUNTER — Other Ambulatory Visit (HOSPITAL_COMMUNITY): Payer: Self-pay

## 2023-05-22 ENCOUNTER — Other Ambulatory Visit (HOSPITAL_BASED_OUTPATIENT_CLINIC_OR_DEPARTMENT_OTHER): Payer: Self-pay

## 2023-05-22 ENCOUNTER — Other Ambulatory Visit: Payer: Self-pay | Admitting: Family Medicine

## 2023-05-22 MED ORDER — VALACYCLOVIR HCL 1 G PO TABS
1000.0000 mg | ORAL_TABLET | Freq: Three times a day (TID) | ORAL | 2 refills | Status: AC | PRN
Start: 2023-05-22 — End: ?
  Filled 2023-05-22 – 2023-05-27 (×3): qty 30, 10d supply, fill #0
  Filled 2023-09-06: qty 30, 10d supply, fill #1
  Filled 2023-10-15: qty 30, 10d supply, fill #2

## 2023-05-25 ENCOUNTER — Other Ambulatory Visit (HOSPITAL_COMMUNITY): Payer: Self-pay

## 2023-05-27 ENCOUNTER — Other Ambulatory Visit (HOSPITAL_COMMUNITY): Payer: Self-pay

## 2023-05-27 NOTE — Progress Notes (Signed)
[facility] Summary Report / Loop Recorder 

## 2023-05-28 ENCOUNTER — Other Ambulatory Visit (HOSPITAL_COMMUNITY): Payer: Self-pay

## 2023-05-30 ENCOUNTER — Other Ambulatory Visit (HOSPITAL_COMMUNITY): Payer: Self-pay

## 2023-06-03 ENCOUNTER — Other Ambulatory Visit (HOSPITAL_COMMUNITY): Payer: Self-pay

## 2023-06-03 ENCOUNTER — Telehealth: Payer: Self-pay

## 2023-06-03 NOTE — Telephone Encounter (Signed)
Pharmacy Patient Advocate Encounter   Received notification from Patient Advice Request messages that prior authorization for [facility] 0.5MG  is required/requested.   Insurance verification completed.   The patient is insured through [facility] .   Per test claim: PA required; PA submitted to [facility] via Prompt PA Key/confirmation #/EOC [id number] Status is pending

## 2023-06-04 ENCOUNTER — Ambulatory Visit: Payer: No Typology Code available for payment source | Admitting: Neurology

## 2023-06-04 ENCOUNTER — Encounter: Payer: Self-pay | Admitting: Neurology

## 2023-06-04 VITALS — BP 123/87 | HR 79 | Ht 65.0 in | Wt 247.0 lb

## 2023-06-04 DIAGNOSIS — I679 Cerebrovascular disease, unspecified: Secondary | ICD-10-CM

## 2023-06-04 DIAGNOSIS — I639 Cerebral infarction, unspecified: Secondary | ICD-10-CM | POA: Diagnosis not present

## 2023-06-04 DIAGNOSIS — G44209 Tension-type headache, unspecified, not intractable: Secondary | ICD-10-CM | POA: Diagnosis not present

## 2023-06-04 NOTE — Progress Notes (Signed)
Follow-up Visit   Date: 06/04/2023    Emily Osborne MRN: [id number] DOB: 1961/04/25    Emily Osborne is a 62 y.o. right-handed African American female with fibromyalgia, chronic headaches, hypertension, hyperlipidemia, vitamin B12 deficiency, and Graves disease returning to the clinic for follow-up of dizziness.  The patient was accompanied to the clinic by self.   IMPRESSION/PLAN: Dizziness in the setting of right vertebral stenosis. MRI brain did not show evidence of stroke.  Improved since taking meclizine, which suggests she may have had BPPV.  To further evaluate, cerebral angiogram was discussed, but at this time, given that she is asymptomatic, it is reasonable to hold on imaging at this time.     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  No LVO.  TEE with positive R > L shunt concerning for small PFO.  Loop recorder placed in December 2023. If there is no arrhythmia on monitoring, PFO closure may be considered in the future.  - Continue plavix 75mg  daily  - Continue secondary risk factor modification.  Continue lipitor 40mg  (LDL 95) and BP management.    - Recommend follow-up with cardiology   3.  Tension headaches, stable  - OK to take tylenol as needed.  Limit to twice per week.  Return to clinic in 4 months   --------------------------------------------- UPDATE 07/25/2022: She was admitted on 06/22/2022 with stroke after presenting with left sided weakness/numbness and headache.  MRI brain shows scattered punctate ischemic infarcts bilaterally.  TEE showed normal EF, mild LVH.  CTA head and neck did not show large vessel occlusion or stenosis, there was note of diffuse tortuosity. TEE noted + bubble with R>L shunt.   She will be having loop recorder implanted later this month and if there is no atrial fibrillation present on monitoring, the next step would be PFO closure. She denies any ongoing weakness and feels back to her baseline.  She continues  to have headaches about 1-2 times per month over the top of her head.  She typically treats this with tylenol and it resolved within a few minutes.    She is a nonsmoker.  No history of diabetes.  She takes meformin for weight loss.    UPDATE 11/28/2022:  She is here for follow-up visit.   She reports having some word-finding difficulty and has some word substitution, which has been ongoing since her stroke.  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  She completed PT but continues to have pain and achiness in the left arm.  She denies weakness.   No new neurological symptoms.   She continues to have headaches a few times per month, which improves with tylenol.   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  UPDATE 04/16/2023:  For the past month, she has been getting spells of dizziness and sensation as if she could pass out.  Symptoms last 20-min. She went to the ER last week because she has associated numbness/tingling of the left face, which lasted 2.5 hour. She had MRI brain which did not show acute stroke.  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   She notices that symptoms are worse if she rotates her head or looks down.  She has occasional nausea and imbalance.  No headaches, vision changes, numbness/tinging, or weakness. She has loop recorder which has not detected any arrhthymias.  UPDATE 06/04/2023:  She has not had any  further spells of dizziness since starting meclizine.  Today, she complains of nagging headache at the vertex of the head.  She takes tylenol which helps within an hour, which she takes about twice per week.    Duration is typically 3-4 hours, if untreated.     Medications:  Current Outpatient Medications on File Prior to Visit  Medication Sig Dispense Refill   acetaminophen (TYLENOL) 500 MG tablet Take 1,000 mg by mouth every 6 (six) hours as needed for moderate pain.     acetaminophen  (TYLENOL) 650 MG CR tablet Take 650 mg by mouth as needed for pain.     albuterol (VENTOLIN HFA) 108 (90 Base) MCG/ACT inhaler Inhale 2 puffs into the lungs every 4 (four) hours as needed for wheezing or shortness of breath. 18 g 2   ALPRAZolam (XANAX) 0.25 MG tablet Take 1 tablet (0.25 mg total) by mouth 3 (three) times daily as needed. (Patient taking differently: Take 0.25 mg by mouth as needed for anxiety.) 30 tablet 2   atorvastatin (LIPITOR) 40 MG tablet Take 1 tablet (40 mg) by mouth daily. 90 tablet 1   Biotin 2.5 MG CAPS Take 2.5 mg by mouth every other day.     Blood Glucose Monitoring Suppl (FREESTYLE FREEDOM) KIT 1 Device by Does not apply route daily. 1 each 0   budesonide-formoterol (SYMBICORT) 80-4.5 MCG/ACT inhaler Inhale 2 puffs into the lungs 2 (two) times daily, in the morning and 12 hours later. 10.2 g 11   clopidogrel (PLAVIX) 75 MG tablet Take 1 tablet (75 mg total) by mouth daily. 90 tablet 3   diclofenac sodium (VOLTAREN) 1 % GEL Apply 2 g topically 4 (four) times daily. Rub into affected area of foot 2 to 4 times daily (Patient taking differently: Apply 2 g topically 4 (four) times daily as needed (pain). Rub into affected area of foot 2 to 4 times daily) 100 g 2   EPINEPHrine (EPIPEN 2-PAK) 0.3 mg/0.3 mL IJ SOAJ injection Inject 0.3 mg into the muscle as needed for anaphylaxis. 2 each 2   famotidine (PEPCID) 20 MG tablet Take 1 tablet (20 mg total) by mouth daily after supper. 30 tablet 11   famotidine (PEPCID) 20 MG tablet Take 1 tablet (20 mg) by mouth daily after supper 30 tablet 11   fluticasone (FLONASE) 50 MCG/ACT nasal spray PLACE 2 SPRAYS INTO BOTH NOSTRILS DAILY. 16 g 5   furosemide (LASIX) 40 MG tablet Take 1 tablet (40 mg total) by mouth daily. 90 tablet 3   glucose blood (FREESTYLE LITE) test strip USE TO CHECK BLOOD SUGAR ONCE A DAY 100 each 1   HYDROcodone-acetaminophen (NORCO) 7.5-325 MG tablet Take 1 tablet by mouth every four to six hours as needed for pain  (Patient taking differently: Take 1 tablet by mouth as needed for moderate pain.) 12 tablet 0   levocetirizine (XYZAL) 5 MG tablet Take 1 tablet (5 mg total) by mouth every evening. (Patient taking differently: Take 5 mg by mouth in the morning.) 90 tablet 3   lidocaine (LIDODERM) 5 % Apply to the affected area once daily as needed for a week. **Remove patch after 12 hours** 30 patch 0   meclizine (ANTIVERT) 12.5 MG tablet Take 1 tablet (12.5 mg total) by mouth 2 (two) times daily as needed for dizziness. 30 tablet 1   methimazole (TAPAZOLE) 5 MG tablet Take 1 tablet (5 mg total) by mouth every other day. 45 tablet 1   pantoprazole (PROTONIX) 40 MG tablet  Take 1 tablet (40 mg total) by mouth 1-2 times a day. Must keep appointment in June for refills 60 tablet 1   pantoprazole (PROTONIX) 40 MG tablet Take 1 tablet (40 mg) by mouth 1 - 2 times daily 60 tablet 11   Probiotic Product (ALIGN) 4 MG CAPS Take 1 capsule (4 mg total) by mouth daily. 90 capsule 3   Semaglutide-Weight Management (WEGOVY) 0.25 MG/0.5ML SOAJ Inject 0.5 mg into the skin once a week. 4 mL 1   sucralfate (CARAFATE) 1 g tablet Take 1 tablet by mouth every 6 hours as needed. (Patient taking differently: Take 1 g by mouth as needed (stomach pain).) 60 tablet 3   triamcinolone ointment (KENALOG) 0.1 % Apply as directed to affected area twice a day 454 g 3   valACYclovir (VALTREX) 1000 MG tablet Take 1 tablet (1,000 mg total) by mouth 3 (three) times daily as needed. 30 tablet 2   Vitamin D, Ergocalciferol, (DRISDOL) 1.25 MG (50000 UNIT) CAPS capsule Take 1 capsule (50,000 Units total) by mouth every 7 (seven) days. 12 capsule 1   No current facility-administered medications on file prior to visit.    Allergies:  Allergies  Allergen Reactions   Aspirin Anaphylaxis   Gentamicin Other (See Comments)    Eye drops turned the sclera bright red   Ibuprofen Anaphylaxis   Metronidazole Swelling and Other (See Comments)    Red  face/swelling   Nsaids Anaphylaxis   Doxycycline Nausea And Vomiting and Other (See Comments)    Severe nausea/vomiting   Influenza A (H1n1) Monoval Vac Other (See Comments)    Sick for 3 weeks   Loratadine Other (See Comments)    Fatigue/weakness   Metformin And Related Other (See Comments)    Memory issues and headache   Periactin [Cyproheptadine] Other (See Comments)    Paranoid    Ranitidine Hcl Other (See Comments)    Lips turned red /peel   Sulfamethoxazole-Trimethoprim Other (See Comments)    Face red/peel   Tramadol Hcl Other (See Comments)    Paranoid    Vital Signs:  BP 123/87   Pulse 79   Ht 5\' 5"  (1.651 m)   Wt 247 lb (112 kg)   SpO2 98%   BMI 41.10 kg/m   Neurological Exam: MENTAL STATUS including orientation to time, place, person, recent and remote memory, attention span and concentration, language, and fund of knowledge is normal.  Speech is not dysarthric.  CRANIAL NERVES:   Pupils equal round and reactive to light.  Normal conjugate, extra-ocular eye movements in all directions of gaze.  No nystagmus (improved). No ptosis.  Face is symmetric. Palate elevates symmetrically.  Tongue is midline.  MOTOR:  Motor strength is 5/5 in all extremities.  No atrophy, fasciculations or abnormal movements.  No pronator drift.  Tone is normal.    MSRs:  Reflexes are 2+/4 throughout.  SENSORY:  Intact to vibration throughout.  COORDINATION/GAIT:  Normal finger-to- nose-finger.  Intact rapid alternating movements bilaterally.  Gait is normal and stable. Tandem gait intact.   Data: Lab Results  Component Value Date   HGBA1C 6.2 12/18/2022   Lab Results  Component Value Date   CHOL 160 04/29/2023   HDL 55.40 04/29/2023   LDLCALC 84 04/29/2023   LDLDIRECT 139.4 12/26/2011   TRIG 105.0 04/29/2023   CHOLHDL 3 04/29/2023    MRI brain wo contrast 06/22/2022: 1. Few punctate acute ischemic infarcts involving the cortical gray matter of both cerebral hemispheres,  presumably embolic.  No associated hemorrhage or mass effect. 2. Underlying mild chronic microvascular ischemic disease, stable.  CTA head and neck 06/22/2022:  Negative CTA of the head and neck. No large vessel occlusion or other emergent finding. No hemodynamically significant or correctable stenosis. 2. Diffuse tortuosity of the major arterial vasculature of the head and neck, suggesting chronic underlying hypertension.   TEE 07/09/2022: 1. Left ventricular ejection fraction, by estimation, is 55 to 60%. The  left ventricle has normal function. The left ventricle has no regional  wall motion abnormalities.   2. Right ventricular systolic function is normal. The right ventricular  size is normal.   3. No left atrial/left atrial appendage thrombus was detected. The LAA  emptying velocity was 79 cm/s.   4. The mitral valve is normal in structure. Mild mitral valve  regurgitation. No evidence of mitral stenosis.   5. The aortic valve is tricuspid. Aortic valve regurgitation is not  visualized. No aortic stenosis is present.   6. Agitated saline contrast bubble study was positive with shunting  observed within 3-6 cardiac cycles suggestive of interatrial shunt. There  is a small patent foramen ovale with predominantly right to left shunting  across the atrial septum.    MRI brain wo contrast 04/10/2023: 1. No evidence of an acute intracranial abnormality. 2. Cerebellar atrophy, chronic small vessel ischemic disease and chronic infarcts, as described.  MRA head and neck 04/10/2023: 1. The common carotid and internal carotid arteries are patent within the neck without stenosis. 2. The vertebral arteries are patent within the neck. Apparent severe stenosis at the right vertebral artery origin. However, this apparent stenosis could potentially be exaggerated by vessel tortuosity (no stenosis was present at this site on the prior CTA head/neck of 06/22/2022).  Total time spent reviewing  records, interview, history/exam, documentation, and coordination of care on day of encounter:  20 min    Thank you for allowing me to participate in patient's care.  If I can answer any additional questions, I would be pleased to do so.    Sincerely,    Tomma Ehinger K. Allena Katz, DO

## 2023-06-04 NOTE — Patient Instructions (Signed)
Follow-up with cardiology   I will see you back in 4 months

## 2023-06-04 NOTE — Telephone Encounter (Signed)
Pharmacy Patient Advocate Encounter  Received notification from [facility] that Prior Authorization for [facility] has been DENIED.  Full denial letter will be uploaded to the media tab. See denial reason below.   PA #/Case ID/Reference #: [id number]   DENIAL REASON: Plan exclusion

## 2023-06-05 ENCOUNTER — Other Ambulatory Visit (HOSPITAL_COMMUNITY): Payer: Self-pay

## 2023-06-07 ENCOUNTER — Other Ambulatory Visit (HOSPITAL_COMMUNITY): Payer: Self-pay

## 2023-06-12 ENCOUNTER — Other Ambulatory Visit (HOSPITAL_COMMUNITY): Payer: Self-pay

## 2023-06-12 ENCOUNTER — Other Ambulatory Visit: Payer: Self-pay

## 2023-06-17 ENCOUNTER — Ambulatory Visit (INDEPENDENT_AMBULATORY_CARE_PROVIDER_SITE_OTHER): Payer: No Typology Code available for payment source

## 2023-06-17 DIAGNOSIS — I639 Cerebral infarction, unspecified: Secondary | ICD-10-CM | POA: Diagnosis not present

## 2023-06-17 LAB — CUP PACEART REMOTE DEVICE CHECK
Date Time Interrogation Session: 20241027231459
Implantable Pulse Generator Implant Date: 20231219

## 2023-07-08 NOTE — Progress Notes (Signed)
[facility] Summary Report / Loop Recorder 

## 2023-07-09 ENCOUNTER — Other Ambulatory Visit (HOSPITAL_COMMUNITY): Payer: Self-pay

## 2023-07-10 ENCOUNTER — Ambulatory Visit: Payer: No Typology Code available for payment source | Admitting: Surgical

## 2023-07-10 ENCOUNTER — Other Ambulatory Visit (INDEPENDENT_AMBULATORY_CARE_PROVIDER_SITE_OTHER): Payer: No Typology Code available for payment source

## 2023-07-10 ENCOUNTER — Encounter: Payer: Self-pay | Admitting: Surgical

## 2023-07-10 VITALS — Ht 65.0 in | Wt 247.0 lb

## 2023-07-10 DIAGNOSIS — M25361 Other instability, right knee: Secondary | ICD-10-CM

## 2023-07-12 ENCOUNTER — Encounter: Payer: Self-pay | Admitting: Surgical

## 2023-07-12 NOTE — Progress Notes (Signed)
Office Visit Note   Patient: Emily Osborne           Date of Birth: 03/14/1961           MRN: [id number] Visit Date: 07/10/2023 Requested by: [address], [place], [place] [address] [place],  [place] [address] PCP: Zola Button, Grayling Congress, DO  Subjective: Chief Complaint  Patient presents with   Right Knee - Pain    HPI: Emily Osborne is a 62 y.o. female who presents to the office reporting right knee buckling.  Patient states that on Saturday, Sunday, Monday she was having no pain in her knee but it felt like the knee was unstable and wanted to give out on her.  Has not really have this problem with her knee recently.  Does have history of prior right knee meniscal debridement with Dr. August Saucer in 2016.  The buckling symptoms have actually resolved as of Tuesday of this week and she has not had any recurrence.  Does have some buckling of her left knee now but this is only been going on for 1 to 2 days.  It has not caused her to fall.  No new mechanical symptoms in either knee.  No low back pain.  No groin pain.  Takes Tylenol occasionally for myalgias.  Also has a history of right leg swelling intermittently and is planning to see her cardiologist for this..                ROS: All systems reviewed are negative as they relate to the chief complaint within the history of present illness.  Patient denies fevers or chills.  Assessment & Plan: Visit Diagnoses:  1. Right knee buckling     Plan: Patient is a 62 year old female who presents for evaluation of right knee instability with some buckling over a 3-day period.  This is now resolved but she decided to keep her appointment and get some x-rays to make sure everything looks okay.  She has history of prior meniscal debridement by Dr. August Saucer in the right knee in 2016.  Does have some degenerative changes noted on radiographs taken today.  No clear-cut cause for her buckling unless she was having some subjective instability from her  knee arthritis without associated pain.  She has excellent ligamentous stability and muscular strength surrounding the knee joint.  No need for any intervention at this time and patient will follow-up with the office as needed.  Follow-Up Instructions: No follow-ups on file.   Orders:  Orders Placed This Encounter  Procedures   XR KNEE 3 VIEW RIGHT   No orders of the defined types were placed in this encounter.     Procedures: No procedures performed   Clinical Data: No additional findings.  Objective: Vital Signs: Ht 5\' 5"  (1.651 m)   Wt 247 lb (112 kg)   BMI 41.10 kg/m   Physical Exam:  Constitutional: Patient appears well-developed HEENT:  Head: Normocephalic Eyes:EOM are normal Neck: Normal range of motion Cardiovascular: Normal rate Pulmonary/chest: Effort normal Neurologic: Patient is alert Skin: Skin is warm Psychiatric: Patient has normal mood and affect  Ortho Exam: Ortho exam demonstrates right knee without effusion.  No tenderness over the medial or lateral joint line.  No calf tenderness.  Negative Homans' sign.  No pain with hip range of motion.  Able to perform straight leg raise with excellent quad strength rated 5/5.  Excellent hamstring Repairex rated 5/5.  No bruising or  ecchymosis noted.  No instability to anterior posterior drawer sign.  No instability to varus or valgus stress at 0 and 30 degrees.  Specialty Comments:  No specialty comments available.  Imaging: No results found.   PMFS History: Patient Active Problem List   Diagnosis Date Noted   Hyperlipidemia associated with type 2 diabetes mellitus (HCC) 12/18/2022   Skin rash 10/15/2022   Cerebrovascular accident (CVA) (HCC) 06/29/2022   Acute cerebrovascular accident (CVA) (HCC) 06/22/2022   Hyperglycemia 12/11/2021   Swelling 02/02/2021   Right lower quadrant abdominal pain 02/02/2021   History of COVID-19 10/17/2020   Shortness of breath 10/17/2020   COVID-19 10/12/2020    Dizziness 05/23/2020   Primary hypertension 06/11/2019   New daily persistent headache 06/11/2019   Memory loss 06/11/2019   Upper airway cough syndrome vs cough variant asthma 01/01/2019   Chest pain 12/09/2018   Prediabetes 10/07/2018   Preventative health care 08/07/2018   Anxiety 10/11/2017   S/P partial colectomy 06/18/2017   Multiple drug allergies 06/18/2017   Colitis 02/05/2017   Diarrhea 02/05/2017   Diverticular disease 01/23/2017   Helicobacter pylori (H. pylori) infection 12/13/2016   Routine screening for STI (sexually transmitted infection) 12/13/2016   Clostridium difficile colitis 12/13/2016   Diverticulitis 10/10/2016   Lower abdominal pain 09/29/2016   Palpitations 02/06/2016   Mild diastolic dysfunction 02/06/2016   Graves disease 01/02/2016   Acute diverticulitis 12/07/2015   Acute bacterial sinusitis 10/04/2015   History of colonic polyps 11/15/2014   Left shoulder pain 09/28/2013   Left-sided weakness 09/16/2013   Obesity (BMI 30-39.9) with low erv on pfts 09/25/19 09/02/2013   Myalgia 06/21/2011   Asymptomatic postmenopausal status 09/07/2010   PAIN IN JOINT, MULTIPLE SITES 12/27/2009   LOW BACK PAIN, CHRONIC 12/27/2009   FATIGUE 12/27/2009   DYSPEPSIA&OTHER SPEC DISORDERS FUNCTION STOMACH 09/01/2009   NAUSEA 08/03/2009   FLATULENCE-GAS-BLOATING 08/03/2009   ABDOMINAL PAIN RIGHT UPPER QUADRANT 07/06/2009   SUPRAPUBIC PAIN 07/06/2009   GANGLION CYST, HX OF 07/06/2009   BACK PAIN 05/04/2009   Hyperlipidemia 09/16/2008   UNSPECIFIED MYALGIA AND MYOSITIS 09/16/2008   Precordial pain 07/27/2008   GERD 06/08/2008   HELICOBACTER PYLORI GASTRITIS, HX OF 04/05/2008   OTHER ACUTE REACTIONS TO STRESS 03/16/2008   ABDOMINAL PAIN, EPIGASTRIC 03/16/2008   SHOULDER PAIN, RIGHT 10/01/2007   ELBOW PAIN, RIGHT 10/01/2007   Pain in limb 04/14/2007   DEPENDENT EDEMA, RIGHT LEG 04/14/2007   SHINGLES 04/08/2007   Asthma 03/18/2007   GANGLION CYST, WRIST, RIGHT  03/18/2007   BUNIONECTOMY, HX OF 03/18/2007   Past Medical History:  Diagnosis Date   Allergy    Anxiety    Asthma    pt has inhaler   Back pain    C. difficile colitis    Cataract    bil eyes   Chest pain    Chronic fatigue syndrome    Clostridium difficile colitis 12/13/2016   Diverticulitis    Dyspnea    Dysrhythmia    palpitations   Edema    Elevated blood pressure reading without diagnosis of hypertension    "just elevated when I'm in pain" (12/07/2015)   Fatty liver    Fibromyalgia    Ganglion of joint    right wrist   GERD (gastroesophageal reflux disease)    H. pylori infection    Headache    hx of   Helicobacter pylori (H. pylori) infection 12/13/2016   Herpes zoster without mention of complication    HTN (hypertension)  Hyperglycemia 08/07/2018   Hyperlipidemia    Hyperthyroidism    IBS (irritable bowel syndrome)    Leg edema    Multiple drug allergies 06/18/2017   Myalgia    Nontraumatic rupture of Achilles tendon    Other acute reactions to stress    Pain in joint, shoulder region    Pain in joint, upper arm    Pain in limb    Palpitations    Personal history of other diseases of digestive system    Pneumonia    once   PONV (postoperative nausea and vomiting)    Routine screening for STI (sexually transmitted infection) 12/13/2016   S/P partial colectomy 06/18/2017   Thyroid disease    hyperthyroidism   Vitamin B 12 deficiency     Family History  Problem Relation Age of Onset   Hyperlipidemia Mother    Hypertension Mother        Died, 13   Thyroid disease Mother        Thyroid surgery   Obesity Mother    Heart disease Mother    Hypertension Father    Diabetes Father    Heart disease Father    Kidney disease Father        Died, 71   Hyperlipidemia Father    Pernicious anemia Sister    Thyroid disease Sister        On thyroid Rx   Atrial fibrillation Sister    Leukemia Brother    Cancer Brother        myleoblastic anemia    Coronary artery disease Brother    Hypertension Brother    Lupus Daughter    Colon cancer Neg Hx    Esophageal cancer Neg Hx    Rectal cancer Neg Hx    Stomach cancer Neg Hx    Pancreatic cancer Neg Hx    Prostate cancer Neg Hx    Colon polyps Neg Hx     Past Surgical History:  Procedure Laterality Date   ABDOMINAL HYSTERECTOMY     ACHILLES TENDON REPAIR Right 2005   BUBBLE STUDY  07/09/2022   Procedure: BUBBLE STUDY;  Surgeon: Jodelle Red, MD;  Location: [facility] ENDOSCOPY;  Service: Cardiovascular;;   BUNIONECTOMY Bilateral    Bunionectomy 1983   COLON SURGERY  01/23/2017   6 to 8 inches sigmoid colon removed   COLONOSCOPY     GANGLION CYST EXCISION Right    wrist   LEFT HEART CATH AND CORONARY ANGIOGRAPHY N/A 10/03/2016   Procedure: Left Heart Cath and Coronary Angiography;  Surgeon: Kathleene Hazel, MD;  Location: [facility] INVASIVE CV LAB;  Service: Cardiovascular;  Laterality: N/A;   MENISCUS REPAIR Right 04/2014   POLYPECTOMY     TEE WITHOUT CARDIOVERSION N/A 07/09/2022   Procedure: TRANSESOPHAGEAL ECHOCARDIOGRAM (TEE);  Surgeon: Jodelle Red, MD;  Location: [facility] ENDOSCOPY;  Service: Cardiovascular;  Laterality: N/A;   TUBAL LIGATION     wisdomteeth extraction     Social History   Occupational History   Occupation: Contractor  Tobacco Use   Smoking status: Never   Smokeless tobacco: Never  Vaping Use   Vaping status: Never Used  Substance and Sexual Activity   Alcohol use: No    Alcohol/week: 0.0 standard drinks of alcohol   Drug use: No   Sexual activity: Not Currently    Partners: Male

## 2023-07-15 ENCOUNTER — Other Ambulatory Visit (HOSPITAL_COMMUNITY): Payer: Self-pay

## 2023-07-15 ENCOUNTER — Ambulatory Visit: Payer: No Typology Code available for payment source | Admitting: Family Medicine

## 2023-07-15 ENCOUNTER — Encounter: Payer: Self-pay | Admitting: Family Medicine

## 2023-07-15 VITALS — BP 130/78 | HR 87 | Temp 98.0°F | Resp 18 | Ht 65.0 in | Wt 249.4 lb

## 2023-07-15 DIAGNOSIS — M2550 Pain in unspecified joint: Secondary | ICD-10-CM

## 2023-07-15 DIAGNOSIS — M25512 Pain in left shoulder: Secondary | ICD-10-CM

## 2023-07-15 DIAGNOSIS — G8929 Other chronic pain: Secondary | ICD-10-CM | POA: Diagnosis not present

## 2023-07-15 DIAGNOSIS — Z6841 Body Mass Index (BMI) 40.0 and over, adult: Secondary | ICD-10-CM

## 2023-07-15 DIAGNOSIS — M25511 Pain in right shoulder: Secondary | ICD-10-CM

## 2023-07-15 LAB — SEDIMENTATION RATE: Sed Rate: 10 mm/h (ref 0–30)

## 2023-07-15 MED ORDER — TIRZEPATIDE-WEIGHT MANAGEMENT 2.5 MG/0.5ML ~~LOC~~ SOLN: 2.5000 mg | SUBCUTANEOUS | 0 refills | Status: DC

## 2023-07-15 NOTE — Progress Notes (Signed)
Established Patient Office Visit  Subjective   Patient ID: Emily Osborne, female    DOB: [birth year]  Age: 62 y.o. MRN: [id number]  Chief Complaint  Patient presents with   Joint Pain    Bilateral shoulder pain, x few weeks, pt states having swelling and felt a pop in the left shoulder. No falls or injuries    HPI Discussed the use of AI scribe software for clinical note transcription with the patient, who gave verbal consent to proceed.  History of Present Illness   The patient presents with widespread joint pain, particularly in the shoulders. She describes a recent episode where the left shoulder swelled up, forming a ball-like structure, which eventually felt like it popped. The pain is localized in the shoulder joint, extending from the front to the back. The right shoulder is also painful. The patient reports that the pain intensifies when lifting the left arm past a certain point.  In addition to the shoulder pain, the patient also reports persistent swelling in the right leg. The swelling is painless unless it increases beyond its current state. The patient has been taking Lasix daily to manage the swelling, but notes that it passes through her system quickly.  The patient also reports intermittent knee buckling, which has since resolved. She has a history of seeing a rheumatologist for joint aches. She also reports occasional hand pain, particularly in the mornings, which she has to 'wake up' slowly.  The patient has been managing her pain with 650mg  of Tylenol, which she reports as being effective. She also has hydrocodone at home, prescribed for a dental issue, but she does not use it.  The patient also reports a persistent rash on her back, which has been spreading and causing significant itching. She had a biopsy done for this rash. She also has a skin tag that she would like removed.  The patient is a Runner, broadcasting/film/video and carries a pull bag and another bag, which she admits may be  too heavy. She has been managing her work despite the pain. She also reports changes in her dietary preferences, with an increased desire for salads and a decreased interest in meats.      Patient Active Problem List   Diagnosis Date Noted   Chronic pain of both shoulders 07/15/2023   Morbid obesity (HCC) 07/15/2023   Hyperlipidemia associated with type 2 diabetes mellitus (HCC) 12/18/2022   Skin rash 10/15/2022   Cerebrovascular accident (CVA) (HCC) 06/29/2022   Acute cerebrovascular accident (CVA) (HCC) 06/22/2022   Hyperglycemia 12/11/2021   Swelling 02/02/2021   Right lower quadrant abdominal pain 02/02/2021   History of COVID-19 10/17/2020   Shortness of breath 10/17/2020   COVID-19 10/12/2020   Dizziness 05/23/2020   Primary hypertension 06/11/2019   New daily persistent headache 06/11/2019   Memory loss 06/11/2019   Upper airway cough syndrome vs cough variant asthma 01/01/2019   Chest pain 12/09/2018   Prediabetes 10/07/2018   Preventative health care 08/07/2018   Anxiety 10/11/2017   S/P partial colectomy 06/18/2017   Multiple drug allergies 06/18/2017   Colitis 02/05/2017   Diarrhea 02/05/2017   Diverticular disease 01/23/2017   Helicobacter pylori (H. pylori) infection 12/13/2016   Routine screening for STI (sexually transmitted infection) 12/13/2016   Clostridium difficile colitis 12/13/2016   Diverticulitis 10/10/2016   Lower abdominal pain 09/29/2016   Palpitations 02/06/2016   Mild diastolic dysfunction 02/06/2016   Graves disease 01/02/2016   Acute diverticulitis 12/07/2015   Acute bacterial sinusitis  10/04/2015   History of colonic polyps 11/15/2014   Left shoulder pain 09/28/2013   Left-sided weakness 09/16/2013   Obesity (BMI 30-39.9) with low erv on pfts 09/25/19 09/02/2013   Myalgia 06/21/2011   Asymptomatic postmenopausal status 09/07/2010   PAIN IN JOINT, MULTIPLE SITES 12/27/2009   LOW BACK PAIN, CHRONIC 12/27/2009   FATIGUE 12/27/2009    DYSPEPSIA&OTHER SPEC DISORDERS FUNCTION STOMACH 09/01/2009   NAUSEA 08/03/2009   FLATULENCE-GAS-BLOATING 08/03/2009   ABDOMINAL PAIN RIGHT UPPER QUADRANT 07/06/2009   SUPRAPUBIC PAIN 07/06/2009   GANGLION CYST, HX OF 07/06/2009   BACK PAIN 05/04/2009   Hyperlipidemia 09/16/2008   UNSPECIFIED MYALGIA AND MYOSITIS 09/16/2008   Precordial pain 07/27/2008   GERD 06/08/2008   HELICOBACTER PYLORI GASTRITIS, HX OF 04/05/2008   OTHER ACUTE REACTIONS TO STRESS 03/16/2008   ABDOMINAL PAIN, EPIGASTRIC 03/16/2008   SHOULDER PAIN, RIGHT 10/01/2007   ELBOW PAIN, RIGHT 10/01/2007   Pain in limb 04/14/2007   DEPENDENT EDEMA, RIGHT LEG 04/14/2007   SHINGLES 04/08/2007   Asthma 03/18/2007   GANGLION CYST, WRIST, RIGHT 03/18/2007   BUNIONECTOMY, HX OF 03/18/2007   Past Medical History:  Diagnosis Date   Allergy    Anxiety    Asthma    pt has inhaler   Back pain    C. difficile colitis    Cataract    bil eyes   Chest pain    Chronic fatigue syndrome    Clostridium difficile colitis 12/13/2016   Diverticulitis    Dyspnea    Dysrhythmia    palpitations   Edema    Elevated blood pressure reading without diagnosis of hypertension    "just elevated when I'm in pain" (12/07/2015)   Fatty liver    Fibromyalgia    Ganglion of joint    right wrist   GERD (gastroesophageal reflux disease)    H. pylori infection    Headache    hx of   Helicobacter pylori (H. pylori) infection 12/13/2016   Herpes zoster without mention of complication    HTN (hypertension)    Hyperglycemia 08/07/2018   Hyperlipidemia    Hyperthyroidism    IBS (irritable bowel syndrome)    Leg edema    Multiple drug allergies 06/18/2017   Myalgia    Nontraumatic rupture of Achilles tendon    Other acute reactions to stress    Pain in joint, shoulder region    Pain in joint, upper arm    Pain in limb    Palpitations    Personal history of other diseases of digestive system    Pneumonia    once   PONV (postoperative  nausea and vomiting)    Routine screening for STI (sexually transmitted infection) 12/13/2016   S/P partial colectomy 06/18/2017   Thyroid disease    hyperthyroidism   Vitamin B 12 deficiency    Past Surgical History:  Procedure Laterality Date   ABDOMINAL HYSTERECTOMY     ACHILLES TENDON REPAIR Right 2005   BUBBLE STUDY  07/09/2022   Procedure: BUBBLE STUDY;  Surgeon: Jodelle Red, MD;  Location: [facility] ENDOSCOPY;  Service: Cardiovascular;;   BUNIONECTOMY Bilateral    Bunionectomy 1983   COLON SURGERY  01/23/2017   6 to 8 inches sigmoid colon removed   COLONOSCOPY     GANGLION CYST EXCISION Right    wrist   LEFT HEART CATH AND CORONARY ANGIOGRAPHY N/A 10/03/2016   Procedure: Left Heart Cath and Coronary Angiography;  Surgeon: Kathleene Hazel, MD;  Location: [facility] INVASIVE CV  LAB;  Service: Cardiovascular;  Laterality: N/A;   MENISCUS REPAIR Right 04/2014   POLYPECTOMY     TEE WITHOUT CARDIOVERSION N/A 07/09/2022   Procedure: TRANSESOPHAGEAL ECHOCARDIOGRAM (TEE);  Surgeon: Jodelle Red, MD;  Location: [facility] ENDOSCOPY;  Service: Cardiovascular;  Laterality: N/A;   TUBAL LIGATION     wisdomteeth extraction     Social History   Tobacco Use   Smoking status: Never   Smokeless tobacco: Never  Vaping Use   Vaping status: Never Used  Substance Use Topics   Alcohol use: No    Alcohol/week: 0.0 standard drinks of alcohol   Drug use: No   Social History   Socioeconomic History   Marital status: Divorced    Spouse name: Not on file   Number of children: 2   Years of education: Not on file   Highest education level: Not on file  Occupational History   Occupation: Contractor  Tobacco Use   Smoking status: Never   Smokeless tobacco: Never  Vaping Use   Vaping status: Never Used  Substance and Sexual Activity   Alcohol use: No    Alcohol/week: 0.0 standard drinks of alcohol   Drug use: No   Sexual activity: Not Currently    Partners: Male  Other Topics  Concern   Not on file  Social History Narrative   Lives with alone.  She has two daughter.   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   Right Handed   Caffeine 12 oz to 24 oz a day   Social Determinants of Corporate investment banker Strain: Not on file  Food Insecurity: Not on file  Transportation Needs: Not on file  Physical Activity: Not on file  Stress: Not on file  Social Connections: Not on file  Intimate Partner Violence: Not on file   Family Status  Relation Name Status   Mother  Deceased   Father  Deceased at age 70   Sister  Alive   Brother  Deceased   Brother  (Not Specified)   Brother  (Not Specified)   Daughter  Alive   Neg Hx  (Not Specified)  No partnership data on file   Family History  Problem Relation Age of Onset   Hyperlipidemia Mother    Hypertension Mother        Died, 55   Thyroid disease Mother        Thyroid surgery   Obesity Mother    Heart disease Mother    Hypertension Father    Diabetes Father    Heart disease Father    Kidney disease Father        Died, 78   Hyperlipidemia Father    Pernicious anemia Sister    Thyroid disease Sister        On thyroid Rx   Atrial fibrillation Sister    Leukemia Brother    Cancer Brother        myleoblastic anemia   Coronary artery disease Brother    Hypertension Brother    Lupus Daughter    Colon cancer Neg Hx    Esophageal cancer Neg Hx    Rectal cancer Neg Hx    Stomach cancer Neg Hx    Pancreatic cancer Neg Hx    Prostate cancer Neg Hx    Colon polyps Neg Hx    Allergies  Allergen Reactions   Aspirin Anaphylaxis   Gentamicin Other (See Comments)    Eye drops turned the  sclera bright red   Ibuprofen Anaphylaxis   Metronidazole Swelling and Other (See Comments)    Red face/swelling   Nsaids Anaphylaxis   Doxycycline Nausea And Vomiting and Other (See Comments)    Severe nausea/vomiting   Influenza A (H1n1) Monoval Vac Other (See Comments)    Sick for 3 weeks    Loratadine Other (See Comments)    Fatigue/weakness   Metformin And Related Other (See Comments)    Memory issues and headache   Periactin [Cyproheptadine] Other (See Comments)    Paranoid    Ranitidine Hcl Other (See Comments)    Lips turned red /peel   Sulfamethoxazole-Trimethoprim Other (See Comments)    Face red/peel   Tramadol Hcl Other (See Comments)    Paranoid      Review of Systems  Constitutional:  Negative for chills, fever and malaise/fatigue.  HENT:  Negative for congestion and hearing loss.   Eyes:  Negative for blurred vision and discharge.  Respiratory:  Negative for cough, sputum production and shortness of breath.   Cardiovascular:  Negative for chest pain, palpitations and leg swelling.  Gastrointestinal:  Negative for abdominal pain, blood in stool, constipation, diarrhea, heartburn, nausea and vomiting.  Genitourinary:  Negative for dysuria, frequency, hematuria and urgency.  Musculoskeletal:  Positive for joint pain and myalgias. Negative for back pain and falls.  Skin:  Negative for rash.  Neurological:  Negative for dizziness, sensory change, loss of consciousness, weakness and headaches.  Endo/Heme/Allergies:  Negative for environmental allergies. Does not bruise/bleed easily.  Psychiatric/Behavioral:  Negative for depression and suicidal ideas. The patient is not nervous/anxious and does not have insomnia.       Objective:     BP 130/78 (BP Location: Right Arm, Patient Position: Sitting, Cuff Size: Large)   Pulse 87   Temp 98 F (36.7 C) (Oral)   Resp 18   Ht 5\' 5"  (1.651 m)   Wt 249 lb 6.4 oz (113.1 kg)   SpO2 98%   BMI 41.50 kg/m  BP Readings from Last 3 Encounters:  07/15/23 130/78  06/04/23 123/87  04/29/23 132/80   Wt Readings from Last 3 Encounters:  07/15/23 249 lb 6.4 oz (113.1 kg)  07/10/23 247 lb (112 kg)  06/04/23 247 lb (112 kg)   SpO2 Readings from Last 3 Encounters:  07/15/23 98%  06/04/23 98%  04/29/23 97%       Physical Exam Vitals and nursing note reviewed.  Constitutional:      General: She is not in acute distress.    Appearance: Normal appearance. She is well-developed.  HENT:     Head: Normocephalic and atraumatic.  Eyes:     General: No scleral icterus.       Right eye: No discharge.        Left eye: No discharge.  Cardiovascular:     Rate and Rhythm: Normal rate and regular rhythm.     Heart sounds: No murmur heard. Pulmonary:     Effort: Pulmonary effort is normal. No respiratory distress.     Breath sounds: Normal breath sounds.  Musculoskeletal:        General: Tenderness present.     Right shoulder: Tenderness present. Decreased range of motion. Normal strength.     Left shoulder: Tenderness present. Decreased range of motion.     Cervical back: Normal range of motion and neck supple.     Right lower leg: No edema.     Left lower leg: No edema.  Skin:  General: Skin is warm and dry.  Neurological:     Mental Status: She is alert and oriented to person, place, and time.  Psychiatric:        Mood and Affect: Mood normal.        Behavior: Behavior normal.        Thought Content: Thought content normal.        Judgment: Judgment normal.      No results found for any visits on 07/15/23.  Last CBC Lab Results  Component Value Date   WBC 5.5 04/29/2023   HGB 13.9 04/29/2023   HCT 43.5 04/29/2023   MCV 89.8 04/29/2023   MCH 29.2 04/10/2023   RDW 14.7 04/29/2023   PLT 266.0 04/29/2023   Last metabolic panel Lab Results  Component Value Date   GLUCOSE 79 04/29/2023   NA 142 04/29/2023   K 4.0 04/29/2023   CL 103 04/29/2023   CO2 33 (H) 04/29/2023   BUN 11 04/29/2023   CREATININE 0.96 04/29/2023   GFR 63.07 04/29/2023   CALCIUM 9.8 04/29/2023   PROT 7.5 04/29/2023   ALBUMIN 4.5 04/29/2023   LABGLOB 2.4 03/20/2018   AGRATIO 1.8 03/20/2018   BILITOT 0.5 04/29/2023   ALKPHOS 87 04/29/2023   AST 24 04/29/2023   ALT 27 04/29/2023   ANIONGAP 8 04/10/2023    Last lipids Lab Results  Component Value Date   CHOL 160 04/29/2023   HDL 55.40 04/29/2023   LDLCALC 84 04/29/2023   LDLDIRECT 139.4 12/26/2011   TRIG 105.0 04/29/2023   CHOLHDL 3 04/29/2023   Last hemoglobin A1c Lab Results  Component Value Date   HGBA1C 6.2 12/18/2022   Last thyroid functions Lab Results  Component Value Date   TSH 0.97 04/29/2023   T3TOTAL 139 12/08/2015   T4TOTAL 6.9 05/08/2022   Last vitamin D Lab Results  Component Value Date   VD25OH 23.40 (L) 04/29/2023   Last vitamin B12 and Folate Lab Results  Component Value Date   VITAMINB12 >1501 (H) 04/29/2023   FOLATE 15.2 12/12/2017      The ASCVD Risk score (Arnett DK, et al., 2019) failed to calculate for the following reasons:   The patient has a prior MI or stroke diagnosis    Assessment & Plan:   Problem List Items Addressed This Visit       Unprioritized   Morbid obesity (HCC)   Relevant Medications   tirzepatide (ZEPBOUND) 2.5 MG/0.5ML injection vial   Chronic pain of both shoulders - Primary   Relevant Orders   Ambulatory referral to Orthopedic Surgery   Other Visit Diagnoses     Arthralgia, unspecified joint       Relevant Orders   Rheumatoid Factor   Antinuclear Antib (ANA)   Sedimentation rate     Assessment and Plan    Sure! Here is the updated Text with the patient's preferred pronoun "she":  Text: ### Shoulder Pain   She exhibits bilateral shoulder pain with swelling and tenderness, worsening in the evenings, likely due to overuse. The differential diagnosis includes arthritis or other joint pathologies. An episode of swelling accompanied by a popping sensation was noted in the left shoulder. Radiology has been delayed, with orthopedics tasked to manage imaging. We will refer her to orthopedics for evaluation and imaging, advise alternating between ice and heat for pain management, and continue Tylenol Arthritis 650 mg as needed.  Knee Pain and Swelling   She  presents with right knee pain, characterized by buckling  and swelling. An X-ray confirmed mild arthritis, with the swelling correlating with increased pain. We will continue Lasix as needed, ensuring she stays near a bathroom due to rapid diuresis, and follow up with orthopedics as needed.  Rash   She has a persistent rash on her back since late July/early August, which has been spreading and itching. Dermatology has performed a biopsy and applied cryotherapy to a separate lesion. We will follow up with dermatology in two weeks for biopsy results and further management.  Weight Management   She previously responded well to [facility] for weight loss, but insurance did not approve its continued use. We discussed Zepbound from [place], an alternative medication requiring manual injection, available at a lower cost initially. We will switch to Zepbound injection from [place], starting at 0.25 mg, with [employer] to arrange payment and shipment. We plan to monitor and potentially increase the dose to 0.5 mg after the first month if needed and ensure adequate protein intake to support muscle health.  Follow-up   We will order blood work to check for inflammatory markers and follow up with orthopedics for shoulder and knee issues, as well as with dermatology in two weeks for biopsy results.        No follow-ups on file.    [name], DO

## 2023-07-17 ENCOUNTER — Other Ambulatory Visit (HOSPITAL_COMMUNITY): Payer: Self-pay

## 2023-07-17 LAB — ANTI-NUCLEAR AB-TITER (ANA TITER): ANA Titer 1: 1:160 {titer} — ABNORMAL HIGH

## 2023-07-17 LAB — ANA: Anti Nuclear Antibody (ANA): POSITIVE — AB

## 2023-07-17 LAB — RHEUMATOID FACTOR: Rheumatoid fact SerPl-aCnc: <10 [IU]/mL (ref <14)

## 2023-07-21 LAB — CUP PACEART REMOTE DEVICE CHECK
Date Time Interrogation Session: 20241129230653
Implantable Pulse Generator Implant Date: 20231219

## 2023-07-22 ENCOUNTER — Ambulatory Visit: Payer: No Typology Code available for payment source

## 2023-07-22 DIAGNOSIS — I639 Cerebral infarction, unspecified: Secondary | ICD-10-CM | POA: Diagnosis not present

## 2023-07-23 ENCOUNTER — Encounter: Payer: Self-pay | Admitting: Family Medicine

## 2023-07-23 DIAGNOSIS — R768 Other specified abnormal immunological findings in serum: Secondary | ICD-10-CM

## 2023-07-24 ENCOUNTER — Ambulatory Visit: Payer: No Typology Code available for payment source | Admitting: Orthopedic Surgery

## 2023-07-25 ENCOUNTER — Other Ambulatory Visit (HOSPITAL_BASED_OUTPATIENT_CLINIC_OR_DEPARTMENT_OTHER): Payer: Self-pay

## 2023-08-01 ENCOUNTER — Ambulatory Visit: Payer: No Typology Code available for payment source | Admitting: Family Medicine

## 2023-08-07 ENCOUNTER — Ambulatory Visit: Payer: No Typology Code available for payment source | Admitting: Orthopedic Surgery

## 2023-08-15 ENCOUNTER — Ambulatory Visit: Payer: No Typology Code available for payment source | Admitting: Family Medicine

## 2023-08-16 ENCOUNTER — Other Ambulatory Visit (HOSPITAL_COMMUNITY): Payer: Self-pay

## 2023-08-17 ENCOUNTER — Other Ambulatory Visit (HOSPITAL_COMMUNITY): Payer: Self-pay

## 2023-08-21 DIAGNOSIS — I2510 Atherosclerotic heart disease of native coronary artery without angina pectoris: Secondary | ICD-10-CM

## 2023-08-21 HISTORY — DX: Atherosclerotic heart disease of native coronary artery without angina pectoris: I25.10

## 2023-08-26 ENCOUNTER — Ambulatory Visit (INDEPENDENT_AMBULATORY_CARE_PROVIDER_SITE_OTHER): Payer: No Typology Code available for payment source

## 2023-08-26 DIAGNOSIS — I639 Cerebral infarction, unspecified: Secondary | ICD-10-CM

## 2023-08-26 LAB — CUP PACEART REMOTE DEVICE CHECK
Date Time Interrogation Session: 20250103230623
Implantable Pulse Generator Implant Date: 20231219

## 2023-09-06 ENCOUNTER — Other Ambulatory Visit (HOSPITAL_COMMUNITY): Payer: Self-pay

## 2023-09-06 ENCOUNTER — Other Ambulatory Visit: Payer: Self-pay | Admitting: Family Medicine

## 2023-09-06 MED ORDER — ATORVASTATIN CALCIUM 40 MG PO TABS
40.0000 mg | ORAL_TABLET | Freq: Every day | ORAL | 1 refills | Status: DC
  Filled 2023-09-06: qty 90, 90d supply, fill #0
  Filled 2023-11-19 (×2): qty 90, 90d supply, fill #1

## 2023-09-09 ENCOUNTER — Other Ambulatory Visit (HOSPITAL_BASED_OUTPATIENT_CLINIC_OR_DEPARTMENT_OTHER): Payer: Self-pay

## 2023-09-09 ENCOUNTER — Encounter (HOSPITAL_BASED_OUTPATIENT_CLINIC_OR_DEPARTMENT_OTHER): Payer: Self-pay | Admitting: Cardiology

## 2023-09-09 ENCOUNTER — Other Ambulatory Visit (HOSPITAL_COMMUNITY): Payer: Self-pay

## 2023-09-10 ENCOUNTER — Other Ambulatory Visit (HOSPITAL_COMMUNITY): Payer: Self-pay

## 2023-09-10 ENCOUNTER — Ambulatory Visit (HOSPITAL_BASED_OUTPATIENT_CLINIC_OR_DEPARTMENT_OTHER): Payer: No Typology Code available for payment source | Admitting: Cardiology

## 2023-09-10 MED ORDER — VALACYCLOVIR HCL 500 MG PO TABS
500.0000 mg | ORAL_TABLET | Freq: Every day | ORAL | 0 refills | Status: DC
  Filled 2023-09-10: qty 90, 90d supply, fill #0

## 2023-09-16 ENCOUNTER — Other Ambulatory Visit (HOSPITAL_COMMUNITY): Payer: Self-pay

## 2023-09-25 ENCOUNTER — Ambulatory Visit: Payer: No Typology Code available for payment source | Admitting: Orthopedic Surgery

## 2023-09-30 ENCOUNTER — Ambulatory Visit: Payer: No Typology Code available for payment source

## 2023-09-30 DIAGNOSIS — I639 Cerebral infarction, unspecified: Secondary | ICD-10-CM

## 2023-09-30 LAB — CUP PACEART REMOTE DEVICE CHECK
Date Time Interrogation Session: 20250207231001
Implantable Pulse Generator Implant Date: 20231219

## 2023-10-01 ENCOUNTER — Encounter: Payer: Self-pay | Admitting: Gastroenterology

## 2023-10-02 ENCOUNTER — Other Ambulatory Visit (HOSPITAL_COMMUNITY): Payer: Self-pay

## 2023-10-03 ENCOUNTER — Other Ambulatory Visit (HOSPITAL_COMMUNITY): Payer: Self-pay

## 2023-10-07 NOTE — Progress Notes (Signed)
 [facility] Summary Report / Loop Recorder

## 2023-10-08 ENCOUNTER — Ambulatory Visit: Payer: No Typology Code available for payment source | Admitting: Neurology

## 2023-10-08 ENCOUNTER — Encounter: Payer: Self-pay | Admitting: Neurology

## 2023-10-14 ENCOUNTER — Ambulatory Visit: Payer: No Typology Code available for payment source | Admitting: Orthopedic Surgery

## 2023-10-15 ENCOUNTER — Other Ambulatory Visit: Payer: Self-pay | Admitting: Family Medicine

## 2023-10-15 ENCOUNTER — Other Ambulatory Visit: Payer: Self-pay

## 2023-10-15 ENCOUNTER — Other Ambulatory Visit (HOSPITAL_COMMUNITY): Payer: Self-pay

## 2023-10-15 ENCOUNTER — Encounter: Payer: Self-pay | Admitting: Cardiovascular Disease

## 2023-10-15 MED ORDER — VITAMIN D (ERGOCALCIFEROL) 1.25 MG (50000 UNIT) PO CAPS
50000.0000 [IU] | ORAL_CAPSULE | ORAL | 1 refills | Status: DC
  Filled 2023-10-15: qty 12, 84d supply, fill #0
  Filled 2024-01-08: qty 12, 84d supply, fill #1

## 2023-10-16 ENCOUNTER — Ambulatory Visit: Payer: No Typology Code available for payment source | Admitting: Internal Medicine

## 2023-10-16 ENCOUNTER — Other Ambulatory Visit (HOSPITAL_COMMUNITY): Payer: Self-pay

## 2023-10-16 ENCOUNTER — Encounter: Payer: Self-pay | Admitting: Internal Medicine

## 2023-10-16 VITALS — BP 120/74 | HR 67 | Ht 65.0 in | Wt 246.8 lb

## 2023-10-16 DIAGNOSIS — E0500 Thyrotoxicosis with diffuse goiter without thyrotoxic crisis or storm: Secondary | ICD-10-CM

## 2023-10-16 NOTE — Progress Notes (Signed)
 She patient ID: Emily Osborne, female   DOB: October 17, 1960, 63 y.o.   MRN: [id number]  HPI  Emily Osborne is a 63 y.o.-year-old female, returning for follow-up for Graves' disease.  Last visit 6 months ago.  Interim history: At today's visit, she denies palpitations, tremors, weight loss, anxiety, heat intolerance.  She does mention fatigue especially in the evening. She sleeps well.  She has joint pains and is waiting to see rheumatology (elevated ANA).  Reviewed and addended history: She has a history of Graves' disease based on elevated TSI antibodies in 2017.  We initially started methimazole 5 mg daily and subsequent TFTs have normalized.  She was the low dose of methimazole, 2.5 mg daily >> stopped 10/2017.  We also started atenolol 25 mg twice a day and she was able to come off but we had restart it as she had intermittent chest pain.  She previously mentioned that she developed chest pain if she stopped this (unclear if placebo component).  She had a cardiac cath in 09/2016 showing no pathology. She saw cardiology 08/2018 >> heart monitor for 2 weeks: one very brief episode that looked a little irregular, but it happened in the middle of the night and only lasted about two seconds.  This was considered normal variant.  She came off atenolol after 09/2018  In 01/2022, a TSH returned undetectable at her visit with PCP.  This was confirmed a month later.  She had fatigue, weakness, anxiety, tremors, palpitations, fast HR (110 bpm), 20 lbs weight loss, no appetite, mental fog, frequent stools, chills. At that time, she had a lot of stress at work.  PCP got in touch with me and she was started on methimazole in 02/2022 - 5 mg 2 times a day, along with atenolol. She decreased the dose to 5 mg 1x a day and stopped atenolol in 04/2022.  She mentions that at that time, she developed lower extremity edema, which improved since then.  She gained ~10 lbs back before our visit in 05/2022.  Around that  time, she felt terrible during thyrotoxicosis.  In 06/2022, she had a CVA - sxs resolved.  In 12/2022, we decreased MMI 5 mg MWF.  I reviewed pt's thyroid tests:  Lab Results  Component Value Date   TSH 0.97 04/29/2023   TSH 1.00 04/24/2023   TSH 0.66 02/27/2023   TSH 0.91 01/01/2023   TSH 2.43 11/13/2022   TSH 1.09 07/24/2022   TSH 0.46 06/15/2022   TSH 0.01 (L) 05/08/2022   TSH <0.01 (L) 03/09/2022   TSH <0.01 (L) 01/23/2022   FREET4 0.83 04/24/2023   FREET4 0.72 02/27/2023   FREET4 0.87 01/01/2023   FREET4 0.82 11/13/2022   FREET4 0.69 07/24/2022   FREET4 0.67 06/15/2022   FREET4 0.76 10/07/2018   FREET4 0.75 11/13/2017   FREET4 0.76 05/07/2017   FREET4 0.78 10/05/2016   T3FREE 3.2 04/24/2023   T3FREE 3.2 02/27/2023   T3FREE 2.9 01/01/2023   T3FREE 2.7 11/13/2022   T3FREE 3.4 07/24/2022   T3FREE 3.2 06/15/2022   T3FREE 3.1 10/07/2018   T3FREE 3.3 11/13/2017   T3FREE 2.9 05/07/2017   T3FREE 3.2 10/05/2016   Antithyroid antibodies: Lab Results  Component Value Date   TSI 97 04/24/2023   TSI 449 (H) 06/15/2022   TSI <89 05/07/2017   TSI 393 (H) 12/27/2015   Pt denies: - feeling nodules in neck - hoarseness - dysphagia - choking  Pt does have a FH of thyroid ds.  In mother and sister. No FH of thyroid cancer. No h/o radiation tx to head or neck. No recent steroid use. No herbal supplements. + Biotin use -5000 mcg every other day.  Last dose 2 weeks ago.  Pt. also has a history of vitamin B12 deficiency, history of sigmoidectomy due to diverticulitis, mild diastolic dysfunction, obesity, memory loss, hyperlipidemia, GERD, HTN, prediabetes: Lab Results  Component Value Date   HGBA1C 6.2 12/18/2022   HGBA1C 5.6 06/23/2022   HGBA1C 6.1 12/11/2021   HGBA1C 6.0 02/02/2021   HGBA1C 5.9 11/17/2020   HGBA1C 5.8 08/31/2019   HGBA1C 5.8 06/11/2019   HGBA1C 6.0 12/11/2018   HGBA1C 5.8 08/07/2018   HGBA1C 6.0 05/13/2018   She has a small PFO. On 04/10/2023 she  was in the emergency room for dizziness.  She has a history of CVA in 06/2022 - has a loop recorder placed 08/2022.  She refused an admission at that time as her dizziness was improving. She saw neurology. 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 She was told that she may need an angiogram and possibly also a stent. She started Meclizine - but this caused somnolence.  ROS: Constitutional: + see HPI  Past Medical History:  Diagnosis Date   Allergy    Anxiety    Asthma    pt has inhaler   Back pain    C. difficile colitis    Cataract    bil eyes   Chest pain    Chronic fatigue syndrome    Clostridium difficile colitis 12/13/2016   Diverticulitis    Dyspnea    Dysrhythmia    palpitations   Edema    Elevated blood pressure reading without diagnosis of hypertension    "just elevated when I'm in pain" (12/07/2015)   Fatty liver    Fibromyalgia    Ganglion of joint    right wrist   GERD (gastroesophageal reflux disease)    H. pylori infection    Headache    hx of   Helicobacter pylori (H. pylori) infection 12/13/2016   Herpes zoster without mention of complication    HTN (hypertension)    Hyperglycemia 08/07/2018   Hyperlipidemia    Hyperthyroidism    IBS (irritable bowel syndrome)    Leg edema    Multiple drug allergies 06/18/2017   Myalgia    Nontraumatic rupture of Achilles tendon    Other acute reactions to stress    Pain in joint, shoulder region    Pain in joint, upper arm    Pain in limb    Palpitations    Personal history of other diseases of digestive system    Pneumonia    once   PONV (postoperative nausea and vomiting)    Routine screening for STI (sexually transmitted infection) 12/13/2016   S/P partial colectomy 06/18/2017   Thyroid disease    hyperthyroidism   Vitamin B 12 deficiency    Past Surgical History:  Procedure Laterality Date   ABDOMINAL HYSTERECTOMY     ACHILLES TENDON REPAIR Right 2005   BUBBLE STUDY  07/09/2022   Procedure:  BUBBLE STUDY;  Surgeon: Jodelle Red, MD;  Location: [facility] ENDOSCOPY;  Service: Cardiovascular;;   BUNIONECTOMY Bilateral    Bunionectomy 1983   COLON SURGERY  01/23/2017   6 to 8 inches sigmoid colon removed   COLONOSCOPY     GANGLION CYST EXCISION Right    wrist   LEFT HEART CATH AND CORONARY ANGIOGRAPHY N/A 10/03/2016   Procedure: Left Heart  Cath and Coronary Angiography;  Surgeon: Kathleene Hazel, MD;  Location: [facility] INVASIVE CV LAB;  Service: Cardiovascular;  Laterality: N/A;   MENISCUS REPAIR Right 04/2014   POLYPECTOMY     TEE WITHOUT CARDIOVERSION N/A 07/09/2022   Procedure: TRANSESOPHAGEAL ECHOCARDIOGRAM (TEE);  Surgeon: Jodelle Red, MD;  Location: [facility] ENDOSCOPY;  Service: Cardiovascular;  Laterality: N/A;   TUBAL LIGATION     wisdomteeth extraction     Social History   Socioeconomic History   Marital status: Divorced    Spouse name: Not on file   Number of children: 2   Years of education: Not on file   Highest education level: Not on file  Occupational History   Occupation: Nurse/teacher  Tobacco Use   Smoking status: Never   Smokeless tobacco: Never  Vaping Use   Vaping status: Never Used  Substance and Sexual Activity   Alcohol use: No    Alcohol/week: 0.0 standard drinks of alcohol   Drug use: No   Sexual activity: Not Currently    Partners: Male  Other Topics Concern   Not on file  Social History Narrative   Lives with alone.  She has two daughter.   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   Right Handed   Caffeine 12 oz to 24 oz a day   Social Drivers of Corporate investment banker Strain: Not on file  Food Insecurity: Not on file  Transportation Needs: Not on file  Physical Activity: Not on file  Stress: Not on file  Social Connections: Not on file  Intimate Partner Violence: Not on file   Current Outpatient Medications on File Prior to Visit  Medication Sig Dispense Refill   acetaminophen (TYLENOL) 500  MG tablet Take 1,000 mg by mouth every 6 (six) hours as needed for moderate pain.     acetaminophen (TYLENOL) 650 MG CR tablet Take 650 mg by mouth as needed for pain.     albuterol (VENTOLIN HFA) 108 (90 Base) MCG/ACT inhaler Inhale 2 puffs into the lungs every 4 (four) hours as needed for wheezing or shortness of breath. 18 g 2   ALPRAZolam (XANAX) 0.25 MG tablet Take 1 tablet (0.25 mg total) by mouth 3 (three) times daily as needed. (Patient taking differently: Take 0.25 mg by mouth as needed for anxiety.) 30 tablet 2   atorvastatin (LIPITOR) 40 MG tablet Take 1 tablet (40 mg) by mouth daily. 90 tablet 1   Biotin 2.5 MG CAPS Take 2.5 mg by mouth every other day.     Blood Glucose Monitoring Suppl (FREESTYLE FREEDOM) KIT 1 Device by Does not apply route daily. 1 each 0   budesonide-formoterol (SYMBICORT) 80-4.5 MCG/ACT inhaler Inhale 2 puffs into the lungs 2 (two) times daily, in the morning and 12 hours later. 10.2 g 11   cholecalciferol (VITAMIN D3) 25 MCG (1000 UNIT) tablet Take 1,000 Units by mouth daily.     clopidogrel (PLAVIX) 75 MG tablet Take 1 tablet (75 mg total) by mouth daily. 90 tablet 3   diclofenac sodium (VOLTAREN) 1 % GEL Apply 2 g topically 4 (four) times daily. Rub into affected area of foot 2 to 4 times daily (Patient taking differently: Apply 2 g topically 4 (four) times daily as needed (pain). Rub into affected area of foot 2 to 4 times daily) 100 g 2   EPINEPHrine (EPIPEN 2-PAK) 0.3 mg/0.3 mL IJ SOAJ injection Inject 0.3 mg into the muscle as needed for anaphylaxis.  2 each 2   famotidine (PEPCID) 20 MG tablet Take 1 tablet (20 mg total) by mouth daily after supper. 30 tablet 11   famotidine (PEPCID) 20 MG tablet Take 1 tablet (20 mg) by mouth daily after supper (Patient not taking: Reported on 06/04/2023) 30 tablet 11   fluticasone (FLONASE) 50 MCG/ACT nasal spray PLACE 2 SPRAYS INTO BOTH NOSTRILS DAILY. 16 g 5   furosemide (LASIX) 40 MG tablet Take 1 tablet (40 mg total) by  mouth daily. 90 tablet 3   glucose blood (FREESTYLE LITE) test strip USE TO CHECK BLOOD SUGAR ONCE A DAY 100 each 1   HYDROcodone-acetaminophen (NORCO) 7.5-325 MG tablet Take 1 tablet by mouth every four to six hours as needed for pain (Patient not taking: Reported on 06/04/2023) 12 tablet 0   levocetirizine (XYZAL) 5 MG tablet Take 1 tablet (5 mg total) by mouth every evening. (Patient taking differently: Take 5 mg by mouth in the morning.) 90 tablet 3   lidocaine (LIDODERM) 5 % Apply to the affected area once daily as needed for a week. **Remove patch after 12 hours** 30 patch 0   meclizine (ANTIVERT) 12.5 MG tablet Take 1 tablet (12.5 mg total) by mouth 2 (two) times daily as needed for dizziness. (Patient not taking: Reported on 06/04/2023) 30 tablet 1   methimazole (TAPAZOLE) 5 MG tablet Take 1 tablet (5 mg total) by mouth every other day. 45 tablet 1   pantoprazole (PROTONIX) 40 MG tablet Take 1 tablet (40 mg total) by mouth 1-2 times a day. Must keep appointment in June for refills 60 tablet 1   pantoprazole (PROTONIX) 40 MG tablet Take 1 tablet (40 mg) by mouth 1 - 2 times daily (Patient not taking: Reported on 06/04/2023) 60 tablet 11   Probiotic Product (ALIGN) 4 MG CAPS Take 1 capsule (4 mg total) by mouth daily. 90 capsule 3   Semaglutide-Weight Management (WEGOVY) 0.25 MG/0.5ML SOAJ Inject 0.5 mg into the skin once a week. 4 mL 1   sucralfate (CARAFATE) 1 g tablet Take 1 tablet by mouth every 6 hours as needed. (Patient taking differently: Take 1 g by mouth as needed (stomach pain).) 60 tablet 3   tirzepatide (ZEPBOUND) 2.5 MG/0.5ML injection vial Inject 2.5 mg into the skin once a week. 0.5 mL 0   triamcinolone ointment (KENALOG) 0.1 % Apply as directed to affected area twice a day 454 g 3   valACYclovir (VALTREX) 1000 MG tablet Take 1 tablet (1,000 mg total) by mouth 3 (three) times daily as needed. 30 tablet 2   valACYclovir (VALTREX) 500 MG tablet Take 1 tablet (500 mg total) by mouth  daily for 3 months 90 tablet 0   Vitamin D, Ergocalciferol, (DRISDOL) 1.25 MG (50000 UNIT) CAPS capsule Take 1 capsule (50,000 Units total) by mouth every 7 (seven) days. 12 capsule 1   No current facility-administered medications on file prior to visit.   Allergies  Allergen Reactions   Aspirin Anaphylaxis   Gentamicin Other (See Comments)    Eye drops turned the sclera bright red   Ibuprofen Anaphylaxis   Metronidazole Swelling and Other (See Comments)    Red face/swelling   Nsaids Anaphylaxis   Doxycycline Nausea And Vomiting and Other (See Comments)    Severe nausea/vomiting   Influenza A (H1n1) Monoval Vac Other (See Comments)    Sick for 3 weeks   Loratadine Other (See Comments)    Fatigue/weakness   Metformin And Related Other (See Comments)  Memory issues and headache   Periactin [Cyproheptadine] Other (See Comments)    Paranoid    Ranitidine Hcl Other (See Comments)    Lips turned red /peel   Sulfamethoxazole-Trimethoprim Other (See Comments)    Face red/peel   Tramadol Hcl Other (See Comments)    Paranoid   Family History  Problem Relation Age of Onset   Hyperlipidemia Mother    Hypertension Mother        Died, 50   Thyroid disease Mother        Thyroid surgery   Obesity Mother    Heart disease Mother    Hypertension Father    Diabetes Father    Heart disease Father    Kidney disease Father        Died, 19   Hyperlipidemia Father    Pernicious anemia Sister    Thyroid disease Sister        On thyroid Rx   Atrial fibrillation Sister    Leukemia Brother    Cancer Brother        myleoblastic anemia   Coronary artery disease Brother    Hypertension Brother    Lupus Daughter    Colon cancer Neg Hx    Esophageal cancer Neg Hx    Rectal cancer Neg Hx    Stomach cancer Neg Hx    Pancreatic cancer Neg Hx    Prostate cancer Neg Hx    Colon polyps Neg Hx    PE: There were no vitals taken for this visit. Wt Readings from Last 3 Encounters:  07/15/23  249 lb 6.4 oz (113.1 kg)  07/10/23 247 lb (112 kg)  06/04/23 247 lb (112 kg)   Constitutional: overweight, in NAD Eyes:  EOMI, no exophthalmos ENT: no neck masses, no cervical lymphadenopathy Cardiovascular: RRR, No MRG Respiratory: CTA B Musculoskeletal: no deformities Skin: No rashes  Neurological: no tremor with outstretched hands  ASSESSMENT: 1.  Graves disease - recurrent  PLAN:  1. Patient with history of Graves' disease, diagnosed based on elevated TSI antibodies, resolved before our visit in 09/2018, but with recurrence in 2023.  She was initially on methimazole which we were able to stop in 2019 with subsequently normal TFTs and undetectable TSI's but in 01/2022 she started to have thyrotoxic symptoms and she had an undetectable TSH with high free thyroid hormones.  The pattern was confirmed 1 month later.  She was restarted on methimazole 5 mg twice a day and TFTs improved.  She decreased the methimazole by herself in 05/2022 to only 5 mg daily.  TSI's were still elevated but TSH remained normal afterwards so we could decrease the methimazole further to 5 mg 3 times a week starting 12/2022. -At our last visit from 03/2023, her TFTs were normal.  Her TSI's also returned within the normal range.   -At today's visit, she does not have thyrotoxic signs or symptoms.  She does complain of fatigue in the evening, but sleeps well at night usually. -No tremors or palpitations at today's visit, so no beta-blockers required -Also, no active signs of Graves' ophthalmopathy: No double vision, blurry vision, eye pain, chemosis -We will recheck her TFTs and change the methimazole dose accordingly -We did discuss about other modalities of treatment for Graves' disease to include RAI treatment or, last resort, surgery but she responded well to methimazole so we decided to continue on this -There are no neck masses felt on palpation of her neck today.  Also no neck compression symptoms. -Will  have  her back in 1 year, but it is 6 months for labs.  Orders Placed This Encounter  Procedures   TSH   T4, free   T3, free   She needs refills.  Carlus Pavlov, MD PhD [facility]

## 2023-10-16 NOTE — Patient Instructions (Addendum)
 Please stop at the labs.  Please continue methimazole 5 mg 3x a week.  Please come back for a follow-up appointment in 1 year but in 6 months for labs.

## 2023-10-17 ENCOUNTER — Other Ambulatory Visit (HOSPITAL_COMMUNITY): Payer: Self-pay

## 2023-10-17 LAB — T3, FREE: T3, Free: 2.8 pg/mL (ref 2.3–4.2)

## 2023-10-17 LAB — T4, FREE: Free T4: 1 ng/dL (ref 0.8–1.8)

## 2023-10-17 LAB — TSH: TSH: 1.68 m[IU]/L (ref 0.40–4.50)

## 2023-10-17 MED ORDER — METHIMAZOLE 5 MG PO TABS
5.0000 mg | ORAL_TABLET | ORAL | 3 refills | Status: AC
  Filled 2023-10-17: qty 45, 105d supply, fill #0
  Filled 2023-11-24: qty 36, 84d supply, fill #0
  Filled 2024-03-02: qty 36, 84d supply, fill #1
  Filled 2024-05-20: qty 36, 84d supply, fill #2
  Filled 2024-07-28: qty 36, 84d supply, fill #3

## 2023-10-17 NOTE — Addendum Note (Signed)
 Addended by: Carlus Pavlov on: 10/17/2023 10:51 AM   Modules accepted: Orders

## 2023-10-22 ENCOUNTER — Ambulatory Visit: Admitting: Family Medicine

## 2023-10-22 ENCOUNTER — Encounter: Payer: Self-pay | Admitting: Family Medicine

## 2023-10-22 ENCOUNTER — Other Ambulatory Visit (HOSPITAL_BASED_OUTPATIENT_CLINIC_OR_DEPARTMENT_OTHER): Payer: Self-pay

## 2023-10-22 ENCOUNTER — Ambulatory Visit: Payer: Self-pay | Admitting: Family Medicine

## 2023-10-22 VITALS — BP 100/80 | HR 73 | Temp 98.7°F | Resp 20 | Ht 65.0 in | Wt 245.0 lb

## 2023-10-22 DIAGNOSIS — J0140 Acute pansinusitis, unspecified: Secondary | ICD-10-CM | POA: Diagnosis not present

## 2023-10-22 DIAGNOSIS — R051 Acute cough: Secondary | ICD-10-CM | POA: Diagnosis not present

## 2023-10-22 DIAGNOSIS — R6889 Other general symptoms and signs: Secondary | ICD-10-CM

## 2023-10-22 LAB — POC INFLUENZA A&B (BINAX/QUICKVUE)
Influenza A, POC: NEGATIVE
Influenza B, POC: NEGATIVE

## 2023-10-22 LAB — POC COVID19 BINAXNOW: SARS Coronavirus 2 Ag: NEGATIVE

## 2023-10-22 MED ORDER — AMOXICILLIN-POT CLAVULANATE 875-125 MG PO TABS
1.0000 | ORAL_TABLET | Freq: Two times a day (BID) | ORAL | 0 refills | Status: DC
  Filled 2023-10-22: qty 20, 10d supply, fill #0

## 2023-10-22 MED ORDER — FLUTICASONE PROPIONATE 50 MCG/ACT NA SUSP
2.0000 | Freq: Every day | NASAL | 6 refills | Status: DC
Start: 2023-10-22 — End: 2023-10-29
  Filled 2023-10-22: qty 16, 30d supply, fill #0

## 2023-10-22 MED ORDER — PROMETHAZINE-DM 6.25-15 MG/5ML PO SYRP
5.0000 mL | ORAL_SOLUTION | Freq: Four times a day (QID) | ORAL | 0 refills | Status: AC | PRN
  Filled 2023-10-22: qty 118, 6d supply, fill #0

## 2023-10-22 NOTE — Progress Notes (Unsigned)
 Established Patient Office Visit  Subjective   Patient ID: Emily Osborne, female    DOB: 02/09/1961  Age: 63 y.o. MRN: [id number]  Chief Complaint  Patient presents with   Cough    X4 days, pt states having cough, chills, fatigue, and body aches.     HPI Discussed the use of AI scribe software for clinical note transcription with the patient, who gave verbal consent to proceed.  History of Present Illness   Emily Osborne is a 63 year old female who presents with respiratory symptoms and sinus pressure.  Since Friday night, she has experienced respiratory symptoms beginning with a dry cough. By Saturday, she felt unwell with severe coughing and body aches, leading her to stay in bed all day. She describes intermittent fever-like symptoms, feeling hot and then sweating, but she did not measure her temperature due to non-functional thermometers. She has not taken a COVID or flu test at home due to lack of availability. Her symptoms have been fluctuating, with periods of feeling better followed by feeling worse again. She notes a loss of sense of smell and a deep cough that produces phlegm.  She experiences sinus pressure, particularly on the right side, which sometimes spreads across her head. This pressure seems to alleviate after coughing up phlegm and taking her medication, Cortexazole.  She has been using Tylenol intermittently to manage body aches but has not taken any other over-the-counter medications aside from her regular prescriptions.  She is not sleeping well and feels nauseous, with a sensation of needing to vomit but being unable to do so. She did not attend work yesterday due to these symptoms and attempted to go today but felt too unwell.  No sore throat. Deep cough with phlegm production, sinus pressure, and intermittent fever-like symptoms without measured fever. Nausea and loss of sense of smell.      Patient Active Problem List   Diagnosis Date Noted   Acute  non-recurrent pansinusitis 10/24/2023   Chronic pain of both shoulders 07/15/2023   Morbid obesity (HCC) 07/15/2023   Hyperlipidemia associated with type 2 diabetes mellitus (HCC) 12/18/2022   Skin rash 10/15/2022   Cerebrovascular accident (CVA) (HCC) 06/29/2022   Acute cerebrovascular accident (CVA) (HCC) 06/22/2022   Hyperglycemia 12/11/2021   Swelling 02/02/2021   Right lower quadrant abdominal pain 02/02/2021   History of COVID-19 10/17/2020   Shortness of breath 10/17/2020   COVID-19 10/12/2020   Dizziness 05/23/2020   Primary hypertension 06/11/2019   New daily persistent headache 06/11/2019   Memory loss 06/11/2019   Upper airway cough syndrome vs cough variant asthma 01/01/2019   Chest pain 12/09/2018   Prediabetes 10/07/2018   Preventative health care 08/07/2018   Anxiety 10/11/2017   S/P partial colectomy 06/18/2017   Multiple drug allergies 06/18/2017   Colitis 02/05/2017   Diarrhea 02/05/2017   Diverticular disease 01/23/2017   Helicobacter pylori (H. pylori) infection 12/13/2016   Routine screening for STI (sexually transmitted infection) 12/13/2016   Clostridium difficile colitis 12/13/2016   Diverticulitis 10/10/2016   Lower abdominal pain 09/29/2016   Palpitations 02/06/2016   Mild diastolic dysfunction 02/06/2016   Graves disease 01/02/2016   Acute diverticulitis 12/07/2015   Acute bacterial sinusitis 10/04/2015   History of colonic polyps 11/15/2014   Left shoulder pain 09/28/2013   Left-sided weakness 09/16/2013   Obesity (BMI 30-39.9) with low erv on pfts 09/25/19 09/02/2013   Myalgia 06/21/2011   Asymptomatic postmenopausal status 09/07/2010   PAIN IN JOINT, MULTIPLE SITES  12/27/2009   LOW BACK PAIN, CHRONIC 12/27/2009   FATIGUE 12/27/2009   DYSPEPSIA&OTHER SPEC DISORDERS FUNCTION STOMACH 09/01/2009   NAUSEA 08/03/2009   FLATULENCE-GAS-BLOATING 08/03/2009   ABDOMINAL PAIN RIGHT UPPER QUADRANT 07/06/2009   SUPRAPUBIC PAIN 07/06/2009   GANGLION  CYST, HX OF 07/06/2009   BACK PAIN 05/04/2009   Hyperlipidemia 09/16/2008   UNSPECIFIED MYALGIA AND MYOSITIS 09/16/2008   Precordial pain 07/27/2008   GERD 06/08/2008   HELICOBACTER PYLORI GASTRITIS, HX OF 04/05/2008   OTHER ACUTE REACTIONS TO STRESS 03/16/2008   ABDOMINAL PAIN, EPIGASTRIC 03/16/2008   SHOULDER PAIN, RIGHT 10/01/2007   ELBOW PAIN, RIGHT 10/01/2007   Pain in limb 04/14/2007   DEPENDENT EDEMA, RIGHT LEG 04/14/2007   SHINGLES 04/08/2007   Asthma 03/18/2007   GANGLION CYST, WRIST, RIGHT 03/18/2007   BUNIONECTOMY, HX OF 03/18/2007   Past Medical History:  Diagnosis Date   Allergy    Anxiety    Asthma    pt has inhaler   Back pain    C. difficile colitis    Cataract    bil eyes   Chest pain    Chronic fatigue syndrome    Clostridium difficile colitis 12/13/2016   Diverticulitis    Dyspnea    Dysrhythmia    palpitations   Edema    Elevated blood pressure reading without diagnosis of hypertension    "just elevated when I'm in pain" (12/07/2015)   Fatty liver    Fibromyalgia    Ganglion of joint    right wrist   GERD (gastroesophageal reflux disease)    H. pylori infection    Headache    hx of   Helicobacter pylori (H. pylori) infection 12/13/2016   Herpes zoster without mention of complication    HTN (hypertension)    Hyperglycemia 08/07/2018   Hyperlipidemia    Hyperthyroidism    IBS (irritable bowel syndrome)    Leg edema    Multiple drug allergies 06/18/2017   Myalgia    Nontraumatic rupture of Achilles tendon    Other acute reactions to stress    Pain in joint, shoulder region    Pain in joint, upper arm    Pain in limb    Palpitations    Personal history of other diseases of digestive system    Pneumonia    once   PONV (postoperative nausea and vomiting)    Routine screening for STI (sexually transmitted infection) 12/13/2016   S/P partial colectomy 06/18/2017   Thyroid disease    hyperthyroidism   Vitamin B 12 deficiency    Past  Surgical History:  Procedure Laterality Date   ABDOMINAL HYSTERECTOMY     ACHILLES TENDON REPAIR Right 2005   BUBBLE STUDY  07/09/2022   Procedure: BUBBLE STUDY;  Surgeon: Jodelle Red, MD;  Location[facility] ENDOSCOPY;  Service: Cardiovascular;;   BUNIONECTOMY Bilateral    Bunionectomy 1983   COLON SURGERY  01/23/2017   6 to 8 inches sigmoid colon removed   COLONOSCOPY     GANGLION CYST EXCISION Right    wrist   LEFT HEART CATH AND CORONARY ANGIOGRAPHY N/A 10/03/2016   Procedure: Left Heart Cath and Coronary Angiography;  Surgeon: Kathleene Hazel, MD;  Location: [facility] INVASIVE CV LAB;  Service: Cardiovascular;  Laterality: N/A;   MENISCUS REPAIR Right 04/2014   POLYPECTOMY     TEE WITHOUT CARDIOVERSION N/A 07/09/2022   Procedure: TRANSESOPHAGEAL ECHOCARDIOGRAM (TEE);  Surgeon: Jodelle Red, MD;  Location: [facility] ENDOSCOPY;  Service: Cardiovascular;  Laterality: N/A;  TUBAL LIGATION     wisdomteeth extraction     Social History   Tobacco Use   Smoking status: Never   Smokeless tobacco: Never  Vaping Use   Vaping status: Never Used  Substance Use Topics   Alcohol use: No    Alcohol/week: 0.0 standard drinks of alcohol   Drug use: No   Social History   Socioeconomic History   Marital status: Divorced    Spouse name: Not on file   Number of children: 2   Years of education: Not on file   Highest education level: Not on file  Occupational History   Occupation: Contractor  Tobacco Use   Smoking status: Never   Smokeless tobacco: Never  Vaping Use   Vaping status: Never Used  Substance and Sexual Activity   Alcohol use: No    Alcohol/week: 0.0 standard drinks of alcohol   Drug use: No   Sexual activity: Not Currently    Partners: Male  Other Topics Concern   Not on file  Social History Narrative   Lives with alone.  She has two daughter.   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   Right Handed   Caffeine 12 oz to 24 oz a day    Social Drivers of Corporate investment banker Strain: Not on file  Food Insecurity: Not on file  Transportation Needs: Not on file  Physical Activity: Not on file  Stress: Not on file  Social Connections: Not on file  Intimate Partner Violence: Not on file   Family Status  Relation Name Status   Mother  Deceased   Father  Deceased at age 33   Sister  Alive   Brother  Deceased   Brother  (Not Specified)   Brother  (Not Specified)   Daughter  Alive   Neg Hx  (Not Specified)  No partnership data on file   Family History  Problem Relation Age of Onset   Hyperlipidemia Mother    Hypertension Mother        Died, 12   Thyroid disease Mother        Thyroid surgery   Obesity Mother    Heart disease Mother    Hypertension Father    Diabetes Father    Heart disease Father    Kidney disease Father        Died, 33   Hyperlipidemia Father    Pernicious anemia Sister    Thyroid disease Sister        On thyroid Rx   Atrial fibrillation Sister    Leukemia Brother    Cancer Brother        myleoblastic anemia   Coronary artery disease Brother    Hypertension Brother    Lupus Daughter    Colon cancer Neg Hx    Esophageal cancer Neg Hx    Rectal cancer Neg Hx    Stomach cancer Neg Hx    Pancreatic cancer Neg Hx    Prostate cancer Neg Hx    Colon polyps Neg Hx    Allergies  Allergen Reactions   Aspirin Anaphylaxis   Gentamicin Other (See Comments)    Eye drops turned the sclera bright red   Ibuprofen Anaphylaxis   Metronidazole Swelling and Other (See Comments)    Red face/swelling   Nsaids Anaphylaxis   Doxycycline Nausea And Vomiting and Other (See Comments)    Severe nausea/vomiting   Influenza A (H1n1) Monoval Vac Other (See  Comments)    Sick for 3 weeks   Loratadine Other (See Comments)    Fatigue/weakness   Metformin And Related Other (See Comments)    Memory issues and headache   Periactin [Cyproheptadine] Other (See Comments)    Paranoid    Ranitidine  Hcl Other (See Comments)    Lips turned red /peel   Sulfamethoxazole-Trimethoprim Other (See Comments)    Face red/peel   Tramadol Hcl Other (See Comments)    Paranoid      Review of Systems  Constitutional:  Negative for chills, fever and malaise/fatigue.  HENT:  Negative for congestion and hearing loss.   Eyes:  Negative for blurred vision and discharge.  Respiratory:  Negative for cough, sputum production and shortness of breath.   Cardiovascular:  Negative for chest pain, palpitations and leg swelling.  Gastrointestinal:  Negative for abdominal pain, blood in stool, constipation, diarrhea, heartburn, nausea and vomiting.  Genitourinary:  Negative for dysuria, frequency, hematuria and urgency.  Musculoskeletal:  Negative for back pain, falls and myalgias.  Skin:  Negative for rash.  Neurological:  Negative for dizziness, sensory change, loss of consciousness, weakness and headaches.  Endo/Heme/Allergies:  Negative for environmental allergies. Does not bruise/bleed easily.  Psychiatric/Behavioral:  Negative for depression and suicidal ideas. The patient is not nervous/anxious and does not have insomnia.       Objective:     BP 100/80 (BP Location: Left Arm, Patient Position: Sitting, Cuff Size: Large)   Pulse 73   Temp 98.7 F (37.1 C) (Oral)   Resp 20   Ht 5\' 5"  (1.651 m)   Wt 245 lb (111.1 kg)   SpO2 97%   BMI 40.77 kg/m  BP Readings from Last 3 Encounters:  10/22/23 100/80  10/16/23 120/74  07/15/23 130/78   Wt Readings from Last 3 Encounters:  10/22/23 245 lb (111.1 kg)  10/16/23 246 lb 12.8 oz (111.9 kg)  07/15/23 249 lb 6.4 oz (113.1 kg)   SpO2 Readings from Last 3 Encounters:  10/22/23 97%  10/16/23 96%  07/15/23 98%      Physical Exam Vitals and nursing note reviewed.  Constitutional:      General: She is not in acute distress.    Appearance: Normal appearance. She is well-developed.  HENT:     Head: Normocephalic and atraumatic.     Right Ear:  Tympanic membrane, ear canal and external ear normal. There is no impacted cerumen.     Left Ear: Tympanic membrane, ear canal and external ear normal. There is no impacted cerumen.     Nose: Nose normal.     Mouth/Throat:     Mouth: Mucous membranes are moist.     Pharynx: Oropharynx is clear. No oropharyngeal exudate or posterior oropharyngeal erythema.  Eyes:     General: No scleral icterus.       Right eye: No discharge.        Left eye: No discharge.     Conjunctiva/sclera: Conjunctivae normal.     Pupils: Pupils are equal, round, and reactive to light.  Neck:     Thyroid: No thyromegaly or thyroid tenderness.     Vascular: No JVD.  Cardiovascular:     Rate and Rhythm: Normal rate and regular rhythm.     Heart sounds: Normal heart sounds. No murmur heard. Pulmonary:     Effort: Pulmonary effort is normal. No respiratory distress.     Breath sounds: Normal breath sounds.  Abdominal:     General: Bowel sounds  are normal. There is no distension.     Palpations: Abdomen is soft. There is no mass.     Tenderness: There is no abdominal tenderness. There is no guarding or rebound.  Genitourinary:    Vagina: Normal.  Musculoskeletal:        General: Normal range of motion.     Cervical back: Normal range of motion and neck supple.     Right lower leg: No edema.     Left lower leg: No edema.  Lymphadenopathy:     Cervical: No cervical adenopathy.  Skin:    General: Skin is warm and dry.     Findings: No erythema or rash.  Neurological:     Mental Status: She is alert and oriented to person, place, and time.     Cranial Nerves: No cranial nerve deficit.     Deep Tendon Reflexes: Reflexes are normal and symmetric.  Psychiatric:        Mood and Affect: Mood normal.        Behavior: Behavior normal.        Thought Content: Thought content normal.        Judgment: Judgment normal.      Results for orders placed or performed in visit on 10/22/23  POC COVID-19  Result Value  Ref Range   SARS Coronavirus 2 Ag Negative Negative  POC Influenza A&B (Binax test)  Result Value Ref Range   Influenza A, POC Negative Negative   Influenza B, POC Negative Negative    Last CBC Lab Results  Component Value Date   WBC 5.5 04/29/2023   HGB 13.9 04/29/2023   HCT 43.5 04/29/2023   MCV 89.8 04/29/2023   MCH 29.2 04/10/2023   RDW 14.7 04/29/2023   PLT 266.0 04/29/2023   Last metabolic panel Lab Results  Component Value Date   GLUCOSE 79 04/29/2023   NA 142 04/29/2023   K 4.0 04/29/2023   CL 103 04/29/2023   CO2 33 (H) 04/29/2023   BUN 11 04/29/2023   CREATININE 0.96 04/29/2023   GFR 63.07 04/29/2023   CALCIUM 9.8 04/29/2023   PROT 7.5 04/29/2023   ALBUMIN 4.5 04/29/2023   LABGLOB 2.4 03/20/2018   AGRATIO 1.8 03/20/2018   BILITOT 0.5 04/29/2023   ALKPHOS 87 04/29/2023   AST 24 04/29/2023   ALT 27 04/29/2023   ANIONGAP 8 04/10/2023   Last lipids Lab Results  Component Value Date   CHOL 160 04/29/2023   HDL 55.40 04/29/2023   LDLCALC 84 04/29/2023   LDLDIRECT 139.4 12/26/2011   TRIG 105.0 04/29/2023   CHOLHDL 3 04/29/2023   Last hemoglobin A1c Lab Results  Component Value Date   HGBA1C 6.2 12/18/2022   Last thyroid functions Lab Results  Component Value Date   TSH 1.68 10/16/2023   T3TOTAL 139 12/08/2015   T4TOTAL 6.9 05/08/2022   Last vitamin D Lab Results  Component Value Date   VD25OH 23.40 (L) 04/29/2023   Last vitamin B12 and Folate Lab Results  Component Value Date   VITAMINB12 >1501 (H) 04/29/2023   FOLATE 15.2 12/12/2017      The ASCVD Risk score (Arnett DK, et al., 2019) failed to calculate for the following reasons:   Risk score cannot be calculated because patient has a medical history suggesting prior/existing ASCVD    Assessment & Plan:   Problem List Items Addressed This Visit       Unprioritized   Acute non-recurrent pansinusitis - Primary   Abx per orders  Flonase  Return to office as needed  See AVS        Relevant Medications   promethazine-dextromethorphan (PROMETHAZINE-DM) 6.25-15 MG/5ML syrup   amoxicillin-clavulanate (AUGMENTIN) 875-125 MG tablet   fluticasone (FLONASE) 50 MCG/ACT nasal spray   Other Visit Diagnoses       Flu-like symptoms       Relevant Orders   POC COVID-19 (Completed)   POC Influenza A&B (Binax test) (Completed)     Acute cough       Relevant Medications   promethazine-dextromethorphan (PROMETHAZINE-DM) 6.25-15 MG/5ML syrup     Assessment and Plan    Upper Respiratory Infection   Symptoms align with an upper respiratory infection, including dry cough, myalgia, and intermittent fever-like symptoms since Friday night. COVID-19 and influenza tests are negative. She reports sinus pressure, especially on the right side, and anosmia. High pollen levels may exacerbate symptoms due to her asthma. Considering Augmentin for potential bacterial infection given persistent symptoms and sinus pressure. Cough medicine is prescribed for symptomatic relief. Flonase is recommended for nasal congestion and sinus pressure. She can return to work if afebrile.        No follow-ups on file.    [name], DO

## 2023-10-22 NOTE — Telephone Encounter (Signed)
 Copied from CRM [id number]. Topic: Clinical - Red Word Triage >> Oct 22, 2023  9:34 AM Chantha C wrote: Red Word that prompted transfer to Nurse Triage: Patient thinks she has the flu, symptoms of coughing, fatigue, nasal congestion, headaches/pain, nausea, feel like throwing up, chills, cannot smell, unsure of having a fever, wheezing. Patient has asthma and using inhaler. Patient denies dizziness, or numbness. Please advise [phone]   Chief Complaint: Flu like symptoms  Symptoms: Cough, chills, headache, nausea, nasal congestion, wheezing Frequency: Frequent  Pertinent Negatives: Patient denies known fever Disposition: [] ED /[] Urgent Care (no appt availability in office) / [x] Appointment(In office/virtual)/ []  [facility]/ [] Home Care/ [] Refused Recommended Disposition /[] [facility] [place] [facility]/ []  Follow-up with PCP Additional Notes: Patient reports flu like symptoms for the last 4 days. She states that she has been experiencing cough, chills, headache, nausea, nasal congestion, and wheezing. She reports she has also lost her sense of smell. She states that she has been using her inhaler for her wheezing which has improved her symptoms. Appointment made for the patient today for evaluation.    Reason for Disposition  Patient is HIGH RISK (e.g., age > 64 years, pregnant, HIV+, or chronic medical condition)  Answer Assessment - Initial Assessment Questions 1. WORST SYMPTOM: "What is your worst symptom?" (e.g., cough, runny nose, muscle aches, headache, sore throat, fever)      Cough 2. ONSET: "When did your flu symptoms start?"      4 days ago 3. COUGH: "How bad is the cough?"       Moderate  4. RESPIRATORY DISTRESS: "Describe your breathing."      Wheezing 5. FEVER: "Do you have a fever?" If Yes, ask: "What is your temperature, how was it measured, and when did it start?"     Unsure  6. EXPOSURE: "Were you exposed to someone with influenza?"       No 7. FLU VACCINE: "Did  you get a flu shot this year?"     No, allergic 8. HIGH RISK DISEASE: "Do you have any chronic medical problems?" (e.g., heart or lung disease, asthma, weak immune system, or other HIGH RISK conditions)     Asthma 9. PREGNANCY: "Is there any chance you are pregnant?" "When was your last menstrual period?"     No 10. OTHER SYMPTOMS: "Do you have any other symptoms?"  (e.g., runny nose, muscle aches, headache, sore throat)       Nasal congestion, fatigue, headache, nausea, chills  Protocols used: Influenza (Flu) - [name]

## 2023-10-24 DIAGNOSIS — J0140 Acute pansinusitis, unspecified: Secondary | ICD-10-CM | POA: Insufficient documentation

## 2023-10-24 NOTE — Assessment & Plan Note (Signed)
 Abx per orders Flonase  Return to office as needed  See AVS

## 2023-10-28 ENCOUNTER — Encounter: Payer: Self-pay | Admitting: Family Medicine

## 2023-10-29 ENCOUNTER — Ambulatory Visit: Payer: No Typology Code available for payment source | Admitting: Neurology

## 2023-10-29 ENCOUNTER — Encounter: Payer: Self-pay | Admitting: Neurology

## 2023-10-29 ENCOUNTER — Other Ambulatory Visit (HOSPITAL_COMMUNITY): Payer: Self-pay

## 2023-10-29 VITALS — BP 140/84 | HR 66 | Ht 65.0 in | Wt 246.0 lb

## 2023-10-29 DIAGNOSIS — R4189 Other symptoms and signs involving cognitive functions and awareness: Secondary | ICD-10-CM

## 2023-10-29 DIAGNOSIS — R4789 Other speech disturbances: Secondary | ICD-10-CM

## 2023-10-29 DIAGNOSIS — I679 Cerebrovascular disease, unspecified: Secondary | ICD-10-CM | POA: Diagnosis not present

## 2023-10-29 DIAGNOSIS — I639 Cerebral infarction, unspecified: Secondary | ICD-10-CM | POA: Diagnosis not present

## 2023-10-29 MED ORDER — CEFDINIR 300 MG PO CAPS
300.0000 mg | ORAL_CAPSULE | Freq: Two times a day (BID) | ORAL | 0 refills | Status: DC
  Filled 2023-10-29 – 2023-11-24 (×2): qty 20, 10d supply, fill #0

## 2023-10-29 NOTE — Progress Notes (Signed)
 Follow-up Visit   Date: 10/29/2023    Emily Osborne MRN: [id number] DOB: November 09, 1960    Emily Osborne is a 63 y.o. right-handed African American female with fibromyalgia, chronic headaches, hypertension, hyperlipidemia, vitamin B12 deficiency, and Graves disease returning to the clinic for follow-up of dizziness.  The patient was accompanied to the clinic by self.   IMPRESSION/PLAN: Cognitive changes with word-finding difficulty, most likely stress-induced.  She scored 24/30 on MOCA missing points across domains. Discussed that if her cognitive changes persist despite her personal stressors improving, then additional testing with neuropsychological testing can be ordered.  TSH and vitamin B12 is normal.  Dizziness responsive to meclizine, suggests BPPV-improved. She does have right vertebral stenosis, no evidence of stroke on MRI.  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  No LVO.  TEE with positive R > L shunt concerning for small PFO.  Loop recorder placed in December 2023. If there is no arrhythmia on monitoring, PFO closure may be considered in the future.  - Continue plavix 75mg  daily  - Continue secondary risk factor modification.  Continue lipitor 40mg  (LDL 95) and BP management.    Return to clinic in 6 months   --------------------------------------------- UPDATE 07/25/2022: She was admitted on 06/22/2022 with stroke after presenting with left sided weakness/numbness and headache.  MRI brain shows scattered punctate ischemic infarcts bilaterally.  TEE showed normal EF, mild LVH.  CTA head and neck did not show large vessel occlusion or stenosis, there was note of diffuse tortuosity. TEE noted + bubble with R>L shunt.   She will be having loop recorder implanted later this month and if there is no atrial fibrillation present on monitoring, the next step would be PFO closure. She denies any ongoing weakness and feels back to her baseline.  She  continues to have headaches about 1-2 times per month over the top of her head.  She typically treats this with tylenol and it resolved within a few minutes.    She is a nonsmoker.  No history of diabetes.  She takes meformin for weight loss.    UPDATE 11/28/2022:  She is here for follow-up visit.   She reports having some word-finding difficulty and has some word substitution, which has been ongoing since her stroke.  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  She completed PT but continues to have pain and achiness in the left arm.  She denies weakness.   No new neurological symptoms.   She continues to have headaches a few times per month, which improves with tylenol.   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  UPDATE 04/16/2023:  For the past month, she has been getting spells of dizziness and sensation as if she could pass out.  Symptoms last 20-min. She went to the ER last week because she has associated numbness/tingling of the left face, which lasted 2.5 hour. She had MRI brain which did not show acute stroke.  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   She notices that symptoms are worse if she rotates her head or looks down.  She has occasional nausea and imbalance.  No headaches, vision changes, numbness/tinging, or weakness. She has loop recorder which has not detected any arrhthymias.  UPDATE 06/04/2023:  She has not had any further spells of dizziness since starting meclizine.  Today, she complains of nagging headache at the vertex of the  head.  She takes tylenol which helps within an hour, which she takes about twice per week.    Duration is typically 3-4 hours, if untreated.     UPDATE 10/29/2023:  She is here for follow-up visit.   She sometimes veers to the left upon standing.  No further dizzy spells or falls.   She has noticed that she is losing her train of thought when she is teaching.  She has also  found herself to forget names and has word-finding difficulty.  She is able to eventually remember the word, but takes a little while. She endorses having personal stressors, when became worse in January.  Medications:  Current Outpatient Medications on File Prior to Visit  Medication Sig Dispense Refill   acetaminophen (TYLENOL) 500 MG tablet Take 1,000 mg by mouth every 6 (six) hours as needed for moderate pain.     acetaminophen (TYLENOL) 650 MG CR tablet Take 650 mg by mouth as needed for pain.     albuterol (VENTOLIN HFA) 108 (90 Base) MCG/ACT inhaler Inhale 2 puffs into the lungs every 4 (four) hours as needed for wheezing or shortness of breath. 18 g 2   ALPRAZolam (XANAX) 0.25 MG tablet Take 1 tablet (0.25 mg total) by mouth 3 (three) times daily as needed. (Patient taking differently: Take 0.25 mg by mouth as needed for anxiety.) 30 tablet 2   atorvastatin (LIPITOR) 40 MG tablet Take 1 tablet (40 mg) by mouth daily. 90 tablet 1   Biotin 2.5 MG CAPS Take 2.5 mg by mouth every other day.     Blood Glucose Monitoring Suppl (FREESTYLE FREEDOM) KIT 1 Device by Does not apply route daily. 1 each 0   budesonide-formoterol (SYMBICORT) 80-4.5 MCG/ACT inhaler Inhale 2 puffs into the lungs 2 (two) times daily, in the morning and 12 hours later. 10.2 g 11   cholecalciferol (VITAMIN D3) 25 MCG (1000 UNIT) tablet Take 1,000 Units by mouth daily.     clopidogrel (PLAVIX) 75 MG tablet Take 1 tablet (75 mg total) by mouth daily. 90 tablet 3   diclofenac sodium (VOLTAREN) 1 % GEL Apply 2 g topically 4 (four) times daily. Rub into affected area of foot 2 to 4 times daily (Patient taking differently: Apply 2 g topically 4 (four) times daily as needed (pain). Rub into affected area of foot 2 to 4 times daily) 100 g 2   EPINEPHrine (EPIPEN 2-PAK) 0.3 mg/0.3 mL IJ SOAJ injection Inject 0.3 mg into the muscle as needed for anaphylaxis. 2 each 2   famotidine (PEPCID) 20 MG tablet Take 1 tablet (20 mg) by mouth  daily after supper 30 tablet 11   fluticasone (FLONASE) 50 MCG/ACT nasal spray PLACE 2 SPRAYS INTO BOTH NOSTRILS DAILY. 16 g 5   furosemide (LASIX) 40 MG tablet Take 1 tablet (40 mg total) by mouth daily. 90 tablet 3   glucose blood (FREESTYLE LITE) test strip USE TO CHECK BLOOD SUGAR ONCE A DAY 100 each 1   HYDROcodone-acetaminophen (NORCO) 7.5-325 MG tablet Take 1 tablet by mouth every four to six hours as needed for pain 12 tablet 0   levocetirizine (XYZAL) 5 MG tablet Take 1 tablet (5 mg total) by mouth every evening. (Patient taking differently: Take 5 mg by mouth in the morning.) 90 tablet 3   lidocaine (LIDODERM) 5 % Apply to the affected area once daily as needed for a week. **Remove patch after 12 hours** 30 patch 0   meclizine (ANTIVERT) 12.5 MG tablet Take  1 tablet (12.5 mg total) by mouth 2 (two) times daily as needed for dizziness. 30 tablet 1   methimazole (TAPAZOLE) 5 MG tablet Take 1 tablet (5 mg total) by mouth 3 (three) days a week (M,W,F) 45 tablet 3   pantoprazole (PROTONIX) 40 MG tablet Take 1 tablet (40 mg) by mouth 1 - 2 times daily 60 tablet 11   Probiotic Product (ALIGN) 4 MG CAPS Take 1 capsule (4 mg total) by mouth daily. 90 capsule 3   promethazine-dextromethorphan (PROMETHAZINE-DM) 6.25-15 MG/5ML syrup Take 5 mLs by mouth 4 (four) times daily as needed. 118 mL 0   sucralfate (CARAFATE) 1 g tablet Take 1 tablet by mouth every 6 hours as needed. (Patient taking differently: Take 1 g by mouth as needed (stomach pain).) 60 tablet 3   triamcinolone ointment (KENALOG) 0.1 % Apply as directed to affected area twice a day 454 g 3   valACYclovir (VALTREX) 1000 MG tablet Take 1 tablet (1,000 mg total) by mouth 3 (three) times daily as needed. 30 tablet 2   Vitamin D, Ergocalciferol, (DRISDOL) 1.25 MG (50000 UNIT) CAPS capsule Take 1 capsule (50,000 Units total) by mouth every 7 (seven) days. 12 capsule 1   No current facility-administered medications on file prior to visit.     Allergies:  Allergies  Allergen Reactions   Aspirin Anaphylaxis   Gentamicin Other (See Comments)    Eye drops turned the sclera bright red   Ibuprofen Anaphylaxis   Metronidazole Swelling and Other (See Comments)    Red face/swelling   Nsaids Anaphylaxis   Augmentin [Amoxicillin-Pot Clavulanate]    Doxycycline Nausea And Vomiting and Other (See Comments)    Severe nausea/vomiting   Influenza A (H1n1) Monoval Vac Other (See Comments)    Sick for 3 weeks   Loratadine Other (See Comments)    Fatigue/weakness   Metformin And Related Other (See Comments)    Memory issues and headache   Periactin [Cyproheptadine] Other (See Comments)    Paranoid    Ranitidine Hcl Other (See Comments)    Lips turned red /peel   Sulfamethoxazole-Trimethoprim Other (See Comments)    Face red/peel   Tramadol Hcl Other (See Comments)    Paranoid    Vital Signs:  BP (!) 140/84   Pulse 66   Ht 5\' 5"  (1.651 m)   Wt 246 lb (111.6 kg)   SpO2 98%   BMI 40.94 kg/m   Neurological Exam: MENTAL STATUS including orientation to time, place, person, recent and remote memory, attention span and concentration, language, and fund of knowledge is normal.  Speech is not dysarthric.     10/29/2023   11:02 AM 02/06/2022   12:00 PM  Montreal Cognitive Assessment   Visuospatial/ Executive (0/5) 3 4  Naming (0/3) 3 3  Attention: Read list of digits (0/2) 2 2  Attention: Read list of letters (0/1) 1 1  Attention: Serial 7 subtraction starting at 100 (0/3) 3 3  Language: Repeat phrase (0/2) 2 2  Language : Fluency (0/1) 1 1  Abstraction (0/2) 1 2  Delayed Recall (0/5) 2 3  Orientation (0/6) 6 6  Total 24 27  Adjusted Score (based on education)  27   CRANIAL NERVES:   Pupils equal round and reactive to light.  Normal conjugate, extra-ocular eye movements in all directions of gaze.  No nystagmus. No ptosis.  Face is symmetric.   MOTOR:  Motor strength is 5/5 in all extremities.  No atrophy,  fasciculations or abnormal  movements.  No pronator drift.  Tone is normal.    MSRs:  Reflexes are 2+/4 throughout.  SENSORY:  Intact to vibration throughout.  COORDINATION/GAIT:  Normal finger-to- nose-finger.   Gait is normal and stable.   Data: Lab Results  Component Value Date   HGBA1C 6.2 12/18/2022   Lab Results  Component Value Date   CHOL 160 04/29/2023   HDL 55.40 04/29/2023   LDLCALC 84 04/29/2023   LDLDIRECT 139.4 12/26/2011   TRIG 105.0 04/29/2023   CHOLHDL 3 04/29/2023   Lab Results  Component Value Date   TSH 1.68 10/16/2023   Lab Results  Component Value Date   VITAMINB12 >1501 (H) 04/29/2023    MRI brain wo contrast 06/22/2022: 1. Few punctate acute ischemic infarcts involving the cortical gray matter of both cerebral hemispheres, presumably embolic. No associated hemorrhage or mass effect. 2. Underlying mild chronic microvascular ischemic disease, stable.  CTA head and neck 06/22/2022: Negative CTA of the head and neck. No large vessel occlusion or other emergent finding. No hemodynamically significant or correctable stenosis. Diffuse tortuosity of the major arterial vasculature of the head and neck, suggesting chronic underlying hypertension.   TEE 07/09/2022: 1. Left ventricular ejection fraction, by estimation, is 55 to 60%. The left ventricle has normal function. The left ventricle has no regional  all motion abnormalities.   2. Right ventricular systolic function is normal. The right ventricular size is normal.   3. No left atrial/left atrial appendage thrombus was detected. The LAA emptying velocity was 79 cm/s.   4. The mitral valve is normal in structure. Mild mitral valve regurgitation. No evidence of mitral stenosis.   5. The aortic valve is tricuspid. Aortic valve regurgitation is not visualized. No aortic stenosis is present.   6. Agitated saline contrast bubble study was positive with shunting observed within 3-6 cardiac cycles suggestive of  interatrial shunt. There is a small patent foramen ovale with predominantly right to left shunting  across the atrial septum.   MRI brain wo contrast 04/10/2023: 1. No evidence of an acute intracranial abnormality. 2. Cerebellar atrophy, chronic small vessel ischemic disease and chronic infarcts, as described.  MRA head and neck 04/10/2023: 1. The common carotid and internal carotid arteries are patent within the neck without stenosis. 2. The vertebral arteries are patent within the neck. Apparent severe stenosis at the right vertebral artery origin. However, this apparent stenosis could potentially be exaggerated by vessel tortuosity (no stenosis was present at this site on the prior CTA head/neck of 06/22/2022).   Thank you for allowing me to participate in patient's care.  If I can answer any additional questions, I would be pleased to do so.    Sincerely,    Kentley Cedillo K. Allena Katz, DO

## 2023-11-04 ENCOUNTER — Ambulatory Visit (INDEPENDENT_AMBULATORY_CARE_PROVIDER_SITE_OTHER): Payer: No Typology Code available for payment source

## 2023-11-04 DIAGNOSIS — I639 Cerebral infarction, unspecified: Secondary | ICD-10-CM | POA: Diagnosis not present

## 2023-11-05 LAB — CUP PACEART REMOTE DEVICE CHECK
Date Time Interrogation Session: 20250316231613
Implantable Pulse Generator Implant Date: 20231219

## 2023-11-06 ENCOUNTER — Other Ambulatory Visit (INDEPENDENT_AMBULATORY_CARE_PROVIDER_SITE_OTHER): Payer: Self-pay

## 2023-11-06 ENCOUNTER — Other Ambulatory Visit: Payer: Self-pay

## 2023-11-06 ENCOUNTER — Ambulatory Visit: Payer: No Typology Code available for payment source | Admitting: Orthopedic Surgery

## 2023-11-06 DIAGNOSIS — M25512 Pain in left shoulder: Secondary | ICD-10-CM

## 2023-11-06 DIAGNOSIS — M25511 Pain in right shoulder: Secondary | ICD-10-CM

## 2023-11-07 ENCOUNTER — Encounter: Payer: Self-pay | Admitting: Orthopedic Surgery

## 2023-11-07 NOTE — Progress Notes (Unsigned)
 Office Visit Note   Patient: Emily Osborne           Date of Birth: May 09, 1961           MRN: [id number] Visit Date: 11/06/2023 Requested by: [address] [place], [place] [address] [place],  [place] [address] PCP: Zola Button, Grayling Congress, DO  Subjective: Chief Complaint  Patient presents with   Left Shoulder - Pain   Right Shoulder - Pain    HPI: Emily Osborne is a 63 y.o. female who presents to the office reporting bilateral shoulder pain right worse than left.  Describes decreased range of motion.  She is right-hand dominant.  Symptoms ongoing for 3 and half months.  Denies any history of injury.  Pain does wake her from sleep at night.  Reaching behind her does increase the pain.  Denies any neck pain.  Pain localizes primarily to the right shoulder.  At times she has to lift the right arm with the left.  Has rheumatology appointment pending.  Pain does radiate to the elbow and there is numbness and tingling going to the wrist.  Topical CBD helps.  She is allergic to anti-inflammatories and Tylenol..                ROS: All systems reviewed are negative as they relate to the chief complaint within the history of present illness.  Patient denies fevers or chills.  Assessment & Plan: Visit Diagnoses:  1. Bilateral shoulder pain, unspecified chronicity     Plan: Impression is bilateral shoulder pain.  Rotator cuff strength okay on exam today.  Range of motion also excellent bilaterally.  May be biceps mediated pain since it is primarily anterior.  Overall panel is getting better and we are going to wait on a diagnostic and therapeutic intra-articular injection at this time.  She will follow-up with [place] as needed.  Follow-Up Instructions: No follow-ups on file.   Orders:  Orders Placed This Encounter  Procedures   XR Shoulder Right   No orders of the defined types were placed in this encounter.     Procedures: No procedures performed   Clinical Data: No  additional findings.  Objective: Vital Signs: There were no vitals taken for this visit.  Physical Exam:  Constitutional: Patient appears well-developed HEENT:  Head: Normocephalic Eyes:EOM are normal Neck: Normal range of motion Cardiovascular: Normal rate Pulmonary/chest: Effort normal Neurologic: Patient is alert Skin: Skin is warm Psychiatric: Patient has normal mood and affect  Ortho Exam: Ortho exam demonstrates bilateral shoulder range of motion of 45/95/165.  Rotator cuff strength intact bilaterally to infraspinatus supraspinatus and subscap muscle testing.  No asymmetric AC joint tenderness.  No coarse grinding or popping with passive range of motion at 90 degrees of abduction.  No other masses lymphadenopathy or skin changes noted in the shoulder girdle region. Patient has bilateral 5 out of 5 grip EPL FPL interosseous wrist flexion wrist extension bicep triceps and deltoid strength.  Bilateral palpable radial pulses and no paresthesias C5-T1 in either arm.  Neck range of motion flexion chin to chest with extension approximately 50 degrees with approximately 50 degrees of rotation bilaterally.  No masses lymphadenopathy or skin changes around the neck or shoulder girdle region bilaterally   Specialty Comments:  No specialty comments available.  Imaging: XR Shoulder Right Result Date: 11/07/2023 AP outlet axillary lateral radiographs right shoulder reviewed.  No acute fracture.  Shoulder is located.  Acromiohumeral distance is  normal.  No significant degenerative changes in the glenohumeral or AC joint.  Visualized lung fields clear     PMFS History: Patient Active Problem List   Diagnosis Date Noted   Acute non-recurrent pansinusitis 10/24/2023   Chronic pain of both shoulders 07/15/2023   Morbid obesity (HCC) 07/15/2023   Hyperlipidemia associated with type 2 diabetes mellitus (HCC) 12/18/2022   Skin rash 10/15/2022   Cerebrovascular accident (CVA) (HCC) 06/29/2022    Acute cerebrovascular accident (CVA) (HCC) 06/22/2022   Hyperglycemia 12/11/2021   Swelling 02/02/2021   Right lower quadrant abdominal pain 02/02/2021   History of COVID-19 10/17/2020   Shortness of breath 10/17/2020   COVID-19 10/12/2020   Dizziness 05/23/2020   Primary hypertension 06/11/2019   New daily persistent headache 06/11/2019   Memory loss 06/11/2019   Upper airway cough syndrome vs cough variant asthma 01/01/2019   Chest pain 12/09/2018   Prediabetes 10/07/2018   Preventative health care 08/07/2018   Anxiety 10/11/2017   S/P partial colectomy 06/18/2017   Multiple drug allergies 06/18/2017   Colitis 02/05/2017   Diarrhea 02/05/2017   Diverticular disease 01/23/2017   Helicobacter pylori (H. pylori) infection 12/13/2016   Routine screening for STI (sexually transmitted infection) 12/13/2016   Clostridium difficile colitis 12/13/2016   Diverticulitis 10/10/2016   Lower abdominal pain 09/29/2016   Palpitations 02/06/2016   Mild diastolic dysfunction 02/06/2016   Graves disease 01/02/2016   Acute diverticulitis 12/07/2015   Acute bacterial sinusitis 10/04/2015   History of colonic polyps 11/15/2014   Left shoulder pain 09/28/2013   Left-sided weakness 09/16/2013   Obesity (BMI 30-39.9) with low erv on pfts 09/25/19 09/02/2013   Myalgia 06/21/2011   Asymptomatic postmenopausal status 09/07/2010   PAIN IN JOINT, MULTIPLE SITES 12/27/2009   LOW BACK PAIN, CHRONIC 12/27/2009   FATIGUE 12/27/2009   DYSPEPSIA&OTHER SPEC DISORDERS FUNCTION STOMACH 09/01/2009   NAUSEA 08/03/2009   FLATULENCE-GAS-BLOATING 08/03/2009   ABDOMINAL PAIN RIGHT UPPER QUADRANT 07/06/2009   SUPRAPUBIC PAIN 07/06/2009   GANGLION CYST, HX OF 07/06/2009   BACK PAIN 05/04/2009   Hyperlipidemia 09/16/2008   UNSPECIFIED MYALGIA AND MYOSITIS 09/16/2008   Precordial pain 07/27/2008   GERD 06/08/2008   HELICOBACTER PYLORI GASTRITIS, HX OF 04/05/2008   OTHER ACUTE REACTIONS TO STRESS 03/16/2008    ABDOMINAL PAIN, EPIGASTRIC 03/16/2008   SHOULDER PAIN, RIGHT 10/01/2007   ELBOW PAIN, RIGHT 10/01/2007   Pain in limb 04/14/2007   DEPENDENT EDEMA, RIGHT LEG 04/14/2007   SHINGLES 04/08/2007   Asthma 03/18/2007   GANGLION CYST, WRIST, RIGHT 03/18/2007   BUNIONECTOMY, HX OF 03/18/2007   Past Medical History:  Diagnosis Date   Allergy    Anxiety    Asthma    pt has inhaler   Back pain    C. difficile colitis    Cataract    bil eyes   Chest pain    Chronic fatigue syndrome    Clostridium difficile colitis 12/13/2016   Diverticulitis    Dyspnea    Dysrhythmia    palpitations   Edema    Elevated blood pressure reading without diagnosis of hypertension    "just elevated when I'm in pain" (12/07/2015)   Fatty liver    Fibromyalgia    Ganglion of joint    right wrist   GERD (gastroesophageal reflux disease)    H. pylori infection    Headache    hx of   Helicobacter pylori (H. pylori) infection 12/13/2016   Herpes zoster without mention of complication  HTN (hypertension)    Hyperglycemia 08/07/2018   Hyperlipidemia    Hyperthyroidism    IBS (irritable bowel syndrome)    Leg edema    Multiple drug allergies 06/18/2017   Myalgia    Nontraumatic rupture of Achilles tendon    Other acute reactions to stress    Pain in joint, shoulder region    Pain in joint, upper arm    Pain in limb    Palpitations    Personal history of other diseases of digestive system    Pneumonia    once   PONV (postoperative nausea and vomiting)    Routine screening for STI (sexually transmitted infection) 12/13/2016   S/P partial colectomy 06/18/2017   Thyroid disease    hyperthyroidism   Vitamin B 12 deficiency     Family History  Problem Relation Age of Onset   Hyperlipidemia Mother    Hypertension Mother        Died, 7   Thyroid disease Mother        Thyroid surgery   Obesity Mother    Heart disease Mother    Hypertension Father    Diabetes Father    Heart disease Father     Kidney disease Father        Died, 55   Hyperlipidemia Father    Pernicious anemia Sister    Thyroid disease Sister        On thyroid Rx   Atrial fibrillation Sister    Leukemia Brother    Cancer Brother        myleoblastic anemia   Coronary artery disease Brother    Hypertension Brother    Lupus Daughter    Colon cancer Neg Hx    Esophageal cancer Neg Hx    Rectal cancer Neg Hx    Stomach cancer Neg Hx    Pancreatic cancer Neg Hx    Prostate cancer Neg Hx    Colon polyps Neg Hx     Past Surgical History:  Procedure Laterality Date   ABDOMINAL HYSTERECTOMY     ACHILLES TENDON REPAIR Right 2005   BUBBLE STUDY  07/09/2022   Procedure: BUBBLE STUDY;  Surgeon: Jodelle Red, MD;  Location: [facility] ENDOSCOPY;  Service: Cardiovascular;;   BUNIONECTOMY Bilateral    Bunionectomy 1983   COLON SURGERY  01/23/2017   6 to 8 inches sigmoid colon removed   COLONOSCOPY     GANGLION CYST EXCISION Right    wrist   LEFT HEART CATH AND CORONARY ANGIOGRAPHY N/A 10/03/2016   Procedure: Left Heart Cath and Coronary Angiography;  Surgeon: Kathleene Hazel, MD;  Location: [facility] INVASIVE CV LAB;  Service: Cardiovascular;  Laterality: N/A;   MENISCUS REPAIR Right 04/2014   POLYPECTOMY     TEE WITHOUT CARDIOVERSION N/A 07/09/2022   Procedure: TRANSESOPHAGEAL ECHOCARDIOGRAM (TEE);  Surgeon: Jodelle Red, MD;  Location: [facility] ENDOSCOPY;  Service: Cardiovascular;  Laterality: N/A;   TUBAL LIGATION     wisdomteeth extraction     Social History   Occupational History   Occupation: Contractor  Tobacco Use   Smoking status: Never   Smokeless tobacco: Never  Vaping Use   Vaping status: Never Used  Substance and Sexual Activity   Alcohol use: No    Alcohol/week: 0.0 standard drinks of alcohol   Drug use: No   Sexual activity: Not Currently    Partners: Male

## 2023-11-08 ENCOUNTER — Other Ambulatory Visit (HOSPITAL_COMMUNITY): Payer: Self-pay

## 2023-11-08 NOTE — Progress Notes (Signed)
 [facility] Summary Report / Loop Recorder

## 2023-11-12 ENCOUNTER — Encounter: Payer: Self-pay | Admitting: Cardiovascular Disease

## 2023-11-19 ENCOUNTER — Other Ambulatory Visit (HOSPITAL_COMMUNITY): Payer: Self-pay

## 2023-11-19 ENCOUNTER — Other Ambulatory Visit: Payer: Self-pay | Admitting: Neurology

## 2023-11-21 ENCOUNTER — Other Ambulatory Visit (HOSPITAL_COMMUNITY): Payer: Self-pay

## 2023-11-21 ENCOUNTER — Other Ambulatory Visit: Payer: Self-pay

## 2023-11-21 MED ORDER — CLOPIDOGREL BISULFATE 75 MG PO TABS
75.0000 mg | ORAL_TABLET | Freq: Every day | ORAL | 3 refills | Status: AC
  Filled 2023-11-21: qty 90, 90d supply, fill #0
  Filled 2024-03-13: qty 90, 90d supply, fill #1
  Filled 2024-06-25: qty 90, 90d supply, fill #2

## 2023-11-23 ENCOUNTER — Emergency Department (HOSPITAL_COMMUNITY)

## 2023-11-23 ENCOUNTER — Emergency Department (HOSPITAL_COMMUNITY)
Admission: EM | Admit: 2023-11-23 | Discharge: 2023-11-23 | Disposition: A | Discharge status: Home / Self Care | Attending: Emergency Medicine | Admitting: Emergency Medicine

## 2023-11-23 ENCOUNTER — Encounter (HOSPITAL_COMMUNITY): Payer: Self-pay | Admitting: *Deleted

## 2023-11-23 ENCOUNTER — Other Ambulatory Visit: Payer: Self-pay

## 2023-11-23 DIAGNOSIS — M25561 Pain in right knee: Secondary | ICD-10-CM | POA: Insufficient documentation

## 2023-11-23 DIAGNOSIS — X501XXA Overexertion from prolonged static or awkward postures, initial encounter: Secondary | ICD-10-CM | POA: Diagnosis not present

## 2023-11-23 MED ORDER — ACETAMINOPHEN 325 MG PO TABS
650.0000 mg | ORAL_TABLET | Freq: Once | ORAL | Status: AC
  Administered 2023-11-23: 650 mg via ORAL
  Filled 2023-11-23: qty 2

## 2023-11-23 NOTE — ED Provider Notes (Signed)
 [facility] Provider Note   CSN: [id number] Arrival date & time: 11/23/23  1720    History  Chief Complaint  Patient presents with   Knee Injury    Emily Osborne is a 63 y.o. female here for evaluation of right knee pain.  She was sitting on a plastic chair went to stand up and twisted her knee and felt immediate pain to the medial aspect of her right knee.  No numbness or weakness.  Needed help ambulating due to the pain.  She has had a prior meniscal repair and patellar injury previously followed by Dr. August Saucer.  Took some Tylenol earlier today.  HPI     Home Medications Prior to Admission medications   Medication Sig Start Date End Date Taking? Authorizing Provider  acetaminophen (TYLENOL) 500 MG tablet Take 1,000 mg by mouth every 6 (six) hours as needed for moderate pain.    [provider]  acetaminophen (TYLENOL) 650 MG CR tablet Take 650 mg by mouth as needed for pain.    [provider]  albuterol (VENTOLIN HFA) 108 (90 Base) MCG/ACT inhaler Inhale 2 puffs into the lungs every 4 (four) hours as needed for wheezing or shortness of breath. 05/25/22   Nyoka Cowden, MD  ALPRAZolam Prudy Feeler) 0.25 MG tablet Take 1 tablet (0.25 mg total) by mouth 3 (three) times daily as needed. Patient taking differently: Take 0.25 mg by mouth as needed for anxiety. 03/14/23   [name], DO  atorvastatin (LIPITOR) 40 MG tablet Take 1 tablet (40 mg) by mouth daily. 09/06/23   [name], DO  Biotin 2.5 MG CAPS Take 2.5 mg by mouth every other day.    [provider]  Blood Glucose Monitoring Suppl (FREESTYLE FREEDOM) KIT 1 Device by Does not apply route daily. 06/08/14   [name], DO  budesonide-formoterol (SYMBICORT) 80-4.5 MCG/ACT inhaler Inhale 2 puffs into the lungs 2 (two) times daily, in the morning and 12 hours later. 07/06/22   Nyoka Cowden, MD  cefdinir (OMNICEF) 300 MG capsule Take 1  capsule (300 mg total) by mouth 2 (two) times daily. 10/29/23   [name], DO  cholecalciferol (VITAMIN D3) 25 MCG (1000 UNIT) tablet Take 1,000 Units by mouth daily.    [provider]  clopidogrel (PLAVIX) 75 MG tablet Take 1 tablet (75 mg total) by mouth daily. 11/21/23   Nita Sickle K, DO  diclofenac sodium (VOLTAREN) 1 % GEL Apply 2 g topically 4 (four) times daily. Rub into affected area of foot 2 to 4 times daily Patient taking differently: Apply 2 g topically 4 (four) times daily as needed (pain). Rub into affected area of foot 2 to 4 times daily 10/02/18   Vivi Barrack, DPM  EPINEPHrine (EPIPEN 2-PAK) 0.3 mg/0.3 mL IJ SOAJ injection Inject 0.3 mg into the muscle as needed for anaphylaxis. 12/18/22   [name], DO  famotidine (PEPCID) 20 MG tablet Take 1 tablet (20 mg) by mouth daily after supper 01/28/23   Meredith Pel, NP  fluticasone (FLONASE) 50 MCG/ACT nasal spray PLACE 2 SPRAYS INTO BOTH NOSTRILS DAILY. 01/28/23   [name], DO  furosemide (LASIX) 40 MG tablet Take 1 tablet (40 mg total) by mouth daily. 04/29/23   Seabron Spates R, DO  glucose blood (FREESTYLE LITE) test strip USE TO CHECK BLOOD SUGAR ONCE A DAY 09/11/21   Lowne  Irish Elders, DO  HYDROcodone-acetaminophen (NORCO) 7.5-325 MG tablet Take 1 tablet by mouth every four to six hours as needed for pain 03/05/23     levocetirizine (XYZAL) 5 MG tablet Take 1 tablet (5 mg total) by mouth every evening. Patient taking differently: Take 5 mg by mouth in the morning. 01/02/23   Seabron Spates R, DO  lidocaine (LIDODERM) 5 % Apply to the affected area once daily as needed for a week. **Remove patch after 12 hours** 01/29/22   Zola Button, Grayling Congress, DO  meclizine (ANTIVERT) 12.5 MG tablet Take 1 tablet (12.5 mg total) by mouth 2 (two) times daily as needed for dizziness. 04/16/23   Nita Sickle K, DO  methimazole (TAPAZOLE) 5 MG tablet Take 1 tablet (5 mg total) by mouth 3  (three) days a week (M,W,F) 10/18/23   Carlus Pavlov, MD  pantoprazole (PROTONIX) 40 MG tablet Take 1 tablet (40 mg) by mouth 1 - 2 times daily 01/28/23   Meredith Pel, NP  Probiotic Product (ALIGN) 4 MG CAPS Take 1 capsule (4 mg total) by mouth daily. 08/01/21   [name], DO  promethazine-dextromethorphan (PROMETHAZINE-DM) 6.25-15 MG/5ML syrup Take 5 mLs by mouth 4 (four) times daily as needed. 10/22/23   Seabron Spates R, DO  sucralfate (CARAFATE) 1 g tablet Take 1 tablet by mouth every 6 hours as needed. Patient taking differently: Take 1 g by mouth as needed (stomach pain). 12/18/22   [name], DO  triamcinolone ointment (KENALOG) 0.1 % Apply as directed to affected area twice a day 04/15/23     valACYclovir (VALTREX) 1000 MG tablet Take 1 tablet (1,000 mg total) by mouth 3 (three) times daily as needed. 05/22/23   [name], DO  Vitamin D, Ergocalciferol, (DRISDOL) 1.25 MG (50000 UNIT) CAPS capsule Take 1 capsule (50,000 Units total) by mouth every 7 (seven) days. 10/15/23   [name], DO      Allergies    Aspirin, Gentamicin, Ibuprofen, Metronidazole, Nsaids, Augmentin [amoxicillin-pot clavulanate], Doxycycline, Influenza a (h1n1) monoval vac, Loratadine, Metformin and related, Periactin [cyproheptadine], Ranitidine hcl, Sulfamethoxazole-trimethoprim, and Tramadol hcl    Review of Systems   Review of Systems  Constitutional: Negative.   HENT: Negative.    Respiratory: Negative.    Cardiovascular: Negative.   Gastrointestinal: Negative.   Genitourinary: Negative.   Musculoskeletal:        Right knee pain  Skin: Negative.   Neurological: Negative.   All other systems reviewed and are negative.   Physical Exam Updated Vital Signs BP 134/89 (BP Location: Left Arm)   Pulse 72   Temp 98.6 F (37 C) (Oral)   Resp 20   Ht 5\' 5"  (1.651 m)   Wt 111.6 kg   SpO2 97%   BMI 40.94 kg/m  Physical Exam Vitals and nursing note  reviewed.  Constitutional:      General: She is not in acute distress.    Appearance: She is well-developed. She is not ill-appearing, toxic-appearing or diaphoretic.  HENT:     Head: Atraumatic.  Eyes:     Pupils: Pupils are equal, round, and reactive to light.  Cardiovascular:     Rate and Rhythm: Normal rate.     Pulses: Normal pulses.          Posterior tibial pulses are 2+ on the right side and 2+ on the left side.  Pulmonary:     Effort: No respiratory distress.  Abdominal:     General: There is no distension.  Musculoskeletal:        General: Normal range of motion.     Cervical back: Normal range of motion.     Comments: Tenderness medial aspect right knee joint line. Pain with range of motion. Compartments soft. Negative anterior drawer  Skin:    General: Skin is warm and dry.  Neurological:     General: No focal deficit present.     Mental Status: She is alert.  Psychiatric:        Mood and Affect: Mood normal.    ED Results / Procedures / Treatments   Labs (all labs ordered are listed, but only abnormal results are displayed) Labs Reviewed - No data to display  EKG None  Radiology DG Knee Complete 4 Views Right Result Date: 11/23/2023 CLINICAL DATA:  Right knee injury, pain EXAM: RIGHT KNEE - COMPLETE 4+ VIEW COMPARISON:  None Available. FINDINGS: Normal alignment. No acute fracture or dislocation. Mild tricompartmental degenerative arthritis, most severe within the medial and lateral compartments. No effusion. Soft tissues are unremarkable. IMPRESSION: 1. Mild tricompartmental degenerative arthritis. Electronically Signed   By: Helyn Numbers M.D.   On: 11/23/2023 19:20    Procedures .Ortho Injury Treatment  Date/Time: 11/23/2023 10:00 PM  Performed by: Ralph Leyden A, PA-C Authorized by: Linwood Dibbles, PA-C   Consent:    Consent obtained:  Verbal   Consent given by:  Patient   Risks discussed:  Fracture, nerve damage, restricted joint movement,  stiffness, recurrent dislocation and irreducible dislocation   Alternatives discussed:  No treatment, alternative treatment, immobilization, referral and delayed treatmentPre-procedure neurovascular assessment: neurovascularly intact Pre-procedure distal perfusion: normal Pre-procedure neurological function: normal Pre-procedure range of motion: normal  Anesthesia: Local anesthesia used: no  Patient sedated: NoImmobilization: brace Splint type: knee immobilizer. Splint Applied by: Milon Dikes Post-procedure neurovascular assessment: post-procedure neurovascularly intact Post-procedure distal perfusion: normal Post-procedure neurological function: normal Post-procedure range of motion: normal       Medications Ordered in ED Medications  acetaminophen (TYLENOL) tablet 650 mg (650 mg Oral Given 11/23/23 1744)   ED Course/ Medical Decision Making/ A&P    Here for right knee pain after twisting injury or right knee. Hx of similar, previously seen by Dr. August Saucer with Ortho.  On arrival she has no obvious infectious process.  She is neurovascularly intact.  Some tenderness to her right medial joint line and meniscus.  Negative anterior drawer.  No clinical evidence of VTE on exam.  No ischemic changes.  Imaging personally viewed and interpreted:  No fracture, dislocation  Patient placed in knee immobilizer.  We discussed follow-up with orthopedics.  She will return for any worsening symptoms.  She states she has assistive devices at home to help her get around.   The patient has been appropriately medically screened and/or stabilized in the ED. I have low suspicion for any other emergent medical condition which would require further screening, evaluation or treatment in the ED or require inpatient management.  Patient is hemodynamically stable and in no acute distress.  Patient able to ambulate in department prior to ED.  Evaluation does not show acute pathology that would require ongoing  or additional emergent interventions while in the emergency department or further inpatient treatment.  I have discussed the diagnosis with the patient and answered all questions.  Pain is been managed while in the emergency department and patient has no further complaints prior to discharge.  Patient is comfortable with  plan discussed in room and is stable for discharge at this time.  I have discussed strict return precautions for returning to the emergency department.  Patient was encouraged to follow-up with PCP/specialist refer to at discharge.                                  Medical Decision Making Amount and/or Complexity of Data Reviewed External Data Reviewed: labs, radiology and notes. Radiology: ordered and independent interpretation performed. Decision-making details documented in ED Course.  Risk OTC drugs. Decision regarding hospitalization. Diagnosis or treatment significantly limited by social determinants of health.          Final Clinical Impression(s) / ED Diagnoses Final diagnoses:  Acute pain of right knee    Rx / DC Orders ED Discharge Orders     None         Hallie Ishida A, PA-C 11/23/23 2202    Wynetta Fines, MD 11/24/23 0001

## 2023-11-23 NOTE — ED Triage Notes (Signed)
 The pt stood up and her rt knee twisted and the leg gave way  no weight bearing at present

## 2023-11-23 NOTE — Discharge Instructions (Signed)
 It was a pleasure taking care of you here in the emergency department  Your x-ray today did not show any broken bones.  I suspect you likely have a ligament or tendon injury.  We have placed you in a knee immobilizer.  May take off while laying in bed.  Tylenol lidocaine patches and ice.  Make sure to follow-up with your orthopedist

## 2023-11-24 ENCOUNTER — Other Ambulatory Visit: Payer: Self-pay | Admitting: Internal Medicine

## 2023-11-25 ENCOUNTER — Other Ambulatory Visit (HOSPITAL_COMMUNITY): Payer: Self-pay

## 2023-11-25 MED ORDER — BUDESONIDE-FORMOTEROL FUMARATE 80-4.5 MCG/ACT IN AERO
2.0000 | INHALATION_SPRAY | Freq: Two times a day (BID) | RESPIRATORY_TRACT | 0 refills | Status: DC
  Filled 2023-11-25: qty 10.2, 30d supply, fill #0

## 2023-11-26 ENCOUNTER — Other Ambulatory Visit: Payer: Self-pay

## 2023-11-26 ENCOUNTER — Other Ambulatory Visit (HOSPITAL_COMMUNITY): Payer: Self-pay

## 2023-11-27 ENCOUNTER — Ambulatory Visit (INDEPENDENT_AMBULATORY_CARE_PROVIDER_SITE_OTHER): Admitting: Orthopedic Surgery

## 2023-11-27 ENCOUNTER — Encounter: Payer: Self-pay | Admitting: Orthopedic Surgery

## 2023-11-27 DIAGNOSIS — M25361 Other instability, right knee: Secondary | ICD-10-CM | POA: Diagnosis not present

## 2023-11-27 NOTE — Progress Notes (Unsigned)
 Office Visit Note   Patient: Emily Osborne           Date of Birth: 1960/09/22           MRN: [id number] Visit Date: 11/27/2023 Requested by: Zola Button, [place], [place] [address] [place],  [place] [address] PCP: [name], DO  Subjective: Chief Complaint  Patient presents with   Right Knee - Pain    Patient was getting out of Lawn chair 4 days ago and felt a pain in her right knee, she advised no falls or injuries. She was seen at [facility] ED and had X-rays done-no report of FX. 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    HPI: Emily Osborne is a 63 y.o. female who presents to the office reporting right knee pain.  About 4 days before this clinic visit she was sitting in a low lawn chair.  She got up and had immediate onset of pretty severe right knee pain.  Knifelike pain in the medial aspect of the knee.  She went to the emergency department.  Radiographs showed no fracture at that time.  Mild to moderate arthritis was present.  She has been using Tylenol ice pack and knee immobilizer.  Currently her symptoms have improved.  She is able to drive but it is still little bit sore to walk.  She did have right knee arthroscopic surgery in 2016.  She works as a Statistician at [employer]..                ROS: All systems reviewed are negative as they relate to the chief complaint within the history of present illness.  Patient denies fevers or chills.  Assessment & Plan: Visit Diagnoses:  1. Right knee buckling     Plan: Impression is right knee pain with exacerbation of existing arthritis.  We talked about an injection today but overall panel is improving.  From a structural standpoint no discrete injury to the knee no effusion today which I think is a good sign.  We will hold off on injection for now.  Follow-up as needed.  Follow-Up Instructions: No follow-ups on file.    Orders:  No orders of the defined types were placed in this encounter.  No orders of the defined types were placed in this encounter.     Procedures: No procedures performed   Clinical Data: No additional findings.  Objective: Vital Signs: There were no vitals taken for this visit.  Physical Exam:  Constitutional: Patient appears well-developed HEENT:  Head: Normocephalic Eyes:EOM are normal Neck: Normal range of motion Cardiovascular: Normal rate Pulmonary/chest: Effort normal Neurologic: Patient is alert Skin: Skin is warm Psychiatric: Patient has normal mood and affect  Ortho Exam: Ortho exam demonstrates slightly antalgic gait to the right.  Pedal pulses palpable.  No groin pain with internal/external Tatian of the leg.  Does have medial greater than lateral joint line tenderness but intact extensor mechanism and stable collateral cruciate ligaments.  No masses lymphadenopathy or skin changes noted in that right knee region.  Specialty Comments:  No specialty comments available.  Imaging: No results found.   PMFS History: Patient Active Problem List   Diagnosis Date Noted   Acute non-recurrent pansinusitis 10/24/2023   Chronic pain of both shoulders 07/15/2023   Morbid obesity (HCC) 07/15/2023   Hyperlipidemia associated with type 2 diabetes  mellitus (HCC) 12/18/2022   Skin rash 10/15/2022   Cerebrovascular accident (CVA) (HCC) 06/29/2022   Acute cerebrovascular accident (CVA) (HCC) 06/22/2022   Hyperglycemia 12/11/2021   Swelling 02/02/2021   Right lower quadrant abdominal pain 02/02/2021   History of COVID-19 10/17/2020   Shortness of breath 10/17/2020   COVID-19 10/12/2020   Dizziness 05/23/2020   Primary hypertension 06/11/2019   New daily persistent headache 06/11/2019   Memory loss 06/11/2019   Upper airway cough syndrome vs cough variant asthma 01/01/2019   Chest pain 12/09/2018   Prediabetes 10/07/2018   Preventative health care 08/07/2018    Anxiety 10/11/2017   S/P partial colectomy 06/18/2017   Multiple drug allergies 06/18/2017   Colitis 02/05/2017   Diarrhea 02/05/2017   Diverticular disease 01/23/2017   Helicobacter pylori (H. pylori) infection 12/13/2016   Routine screening for STI (sexually transmitted infection) 12/13/2016   Clostridium difficile colitis 12/13/2016   Diverticulitis 10/10/2016   Lower abdominal pain 09/29/2016   Palpitations 02/06/2016   Mild diastolic dysfunction 02/06/2016   Graves disease 01/02/2016   Acute diverticulitis 12/07/2015   Acute bacterial sinusitis 10/04/2015   History of colonic polyps 11/15/2014   Left shoulder pain 09/28/2013   Left-sided weakness 09/16/2013   Obesity (BMI 30-39.9) with low erv on pfts 09/25/19 09/02/2013   Myalgia 06/21/2011   Asymptomatic postmenopausal status 09/07/2010   PAIN IN JOINT, MULTIPLE SITES 12/27/2009   LOW BACK PAIN, CHRONIC 12/27/2009   FATIGUE 12/27/2009   DYSPEPSIA&OTHER SPEC DISORDERS FUNCTION STOMACH 09/01/2009   NAUSEA 08/03/2009   FLATULENCE-GAS-BLOATING 08/03/2009   ABDOMINAL PAIN RIGHT UPPER QUADRANT 07/06/2009   SUPRAPUBIC PAIN 07/06/2009   GANGLION CYST, HX OF 07/06/2009   BACK PAIN 05/04/2009   Hyperlipidemia 09/16/2008   UNSPECIFIED MYALGIA AND MYOSITIS 09/16/2008   Precordial pain 07/27/2008   GERD 06/08/2008   HELICOBACTER PYLORI GASTRITIS, HX OF 04/05/2008   OTHER ACUTE REACTIONS TO STRESS 03/16/2008   ABDOMINAL PAIN, EPIGASTRIC 03/16/2008   SHOULDER PAIN, RIGHT 10/01/2007   ELBOW PAIN, RIGHT 10/01/2007   Pain in limb 04/14/2007   DEPENDENT EDEMA, RIGHT LEG 04/14/2007   SHINGLES 04/08/2007   Asthma 03/18/2007   GANGLION CYST, WRIST, RIGHT 03/18/2007   BUNIONECTOMY, HX OF 03/18/2007   Past Medical History:  Diagnosis Date   Allergy    Anxiety    Asthma    pt has inhaler   Back pain    C. difficile colitis    Cataract    bil eyes   Chest pain    Chronic fatigue syndrome    Clostridium difficile colitis  12/13/2016   Diverticulitis    Dyspnea    Dysrhythmia    palpitations   Edema    Elevated blood pressure reading without diagnosis of hypertension    "just elevated when I'm in pain" (12/07/2015)   Fatty liver    Fibromyalgia    Ganglion of joint    right wrist   GERD (gastroesophageal reflux disease)    H. pylori infection    Headache    hx of   Helicobacter pylori (H. pylori) infection 12/13/2016   Herpes zoster without mention of complication    HTN (hypertension)    Hyperglycemia 08/07/2018   Hyperlipidemia    Hyperthyroidism    IBS (irritable bowel syndrome)    Leg edema    Multiple drug allergies 06/18/2017   Myalgia    Nontraumatic rupture of Achilles tendon    Other acute reactions to stress    Pain in joint, shoulder region  Pain in joint, upper arm    Pain in limb    Palpitations    Personal history of other diseases of digestive system    Pneumonia    once   PONV (postoperative nausea and vomiting)    Routine screening for STI (sexually transmitted infection) 12/13/2016   S/P partial colectomy 06/18/2017   Thyroid disease    hyperthyroidism   Vitamin B 12 deficiency     Family History  Problem Relation Age of Onset   Hyperlipidemia Mother    Hypertension Mother        Died, 30   Thyroid disease Mother        Thyroid surgery   Obesity Mother    Heart disease Mother    Hypertension Father    Diabetes Father    Heart disease Father    Kidney disease Father        Died, 55   Hyperlipidemia Father    Pernicious anemia Sister    Thyroid disease Sister        On thyroid Rx   Atrial fibrillation Sister    Leukemia Brother    Cancer Brother        myleoblastic anemia   Coronary artery disease Brother    Hypertension Brother    Lupus Daughter    Colon cancer Neg Hx    Esophageal cancer Neg Hx    Rectal cancer Neg Hx    Stomach cancer Neg Hx    Pancreatic cancer Neg Hx    Prostate cancer Neg Hx    Colon polyps Neg Hx     Past Surgical  History:  Procedure Laterality Date   ABDOMINAL HYSTERECTOMY     ACHILLES TENDON REPAIR Right 2005   BUBBLE STUDY  07/09/2022   Procedure: BUBBLE STUDY;  Surgeon: Jodelle Red, MD;  Location: [facility] ENDOSCOPY;  Service: Cardiovascular;;   BUNIONECTOMY Bilateral    Bunionectomy 1983   COLON SURGERY  01/23/2017   6 to 8 inches sigmoid colon removed   COLONOSCOPY     GANGLION CYST EXCISION Right    wrist   LEFT HEART CATH AND CORONARY ANGIOGRAPHY N/A 10/03/2016   Procedure: Left Heart Cath and Coronary Angiography;  Surgeon: Kathleene Hazel, MD;  Location: [facility] INVASIVE CV LAB;  Service: Cardiovascular;  Laterality: N/A;   MENISCUS REPAIR Right 04/2014   POLYPECTOMY     TEE WITHOUT CARDIOVERSION N/A 07/09/2022   Procedure: TRANSESOPHAGEAL ECHOCARDIOGRAM (TEE);  Surgeon: Jodelle Red, MD;  Location: [facility] ENDOSCOPY;  Service: Cardiovascular;  Laterality: N/A;   TUBAL LIGATION     wisdomteeth extraction     Social History   Occupational History   Occupation: Contractor  Tobacco Use   Smoking status: Never   Smokeless tobacco: Never  Vaping Use   Vaping status: Never Used  Substance and Sexual Activity   Alcohol use: No    Alcohol/week: 0.0 standard drinks of alcohol   Drug use: No   Sexual activity: Not Currently    Partners: Male

## 2023-11-28 NOTE — Progress Notes (Signed)
 Office Visit Note  Patient: Emily Osborne             Date of Birth: April 17, 1961           MRN: [id number]             PCP: Estill Hemming, DO Referring: Estill Hemming, * Visit Date: 12/12/2023 Occupation: @GUAROCC @  Subjective:  Pain in joints  History of Present Illness: Emily Osborne is a 63 y.o. female seen for the evaluation of arthralgias and myalgias.  According the patient she started having generalized aches and pains about 20 years ago.  She recalls seeing me 20 years ago at the time she was diagnosed with fibromyalgia syndrome per patient.  She was also given bilateral trochanteric bursa injections which were helpful.  She states she went through a lot of stress due to her divorce.  She started having generalized achiness in her muscles and the symptoms persist over the years.  She states for the last year she has been having increased pain and discomfort in her lower back, shoulders, hands, hips and her knees.  She also has significant stiffness in the morning and after prolonged sitting.  She has been experiencing increased fatigue.  She states she has a handicap placard because of increased fatigue.  She was evaluated by Dr. Rozelle Corning due to shoulder joint pain at that time the x-rays were unremarkable.  She has had shoulder joint injections in the past.  She noticed some puffiness in her shoulders but none of the other joints are swollen.  She also noticed a rash on her back for which she was seen by dermatologist.  She states she also had a skin biopsy which was unremarkable.  She gives history of fatigue, Dry eyes, arthralgias and myalgias.  There is no history of oral ulcers, nasal ulcers, malar rash, photosensitivity or lymphadenopathy.  There is no family history of autoimmune disease.  She is single, right-handed.  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  She enjoys reading and plays basketball and soccer when she can.  She is gravida 2, para 2.  There is  no history of preeclampsia or DVTs.  She does not drink alcohol and is a non-smoker.    Activities of Daily Living:  Patient reports morning stiffness for a few minutes.   Patient Reports nocturnal pain.  Difficulty dressing/grooming: Denies Difficulty climbing stairs: Reports Difficulty getting out of chair: Reports Difficulty using hands for taps, buttons, cutlery, and/or writing: Denies  Review of Systems  Constitutional:  Positive for fatigue.  HENT:  Negative for mouth sores and mouth dryness.   Eyes:  Positive for dryness.  Respiratory:  Negative for shortness of breath.   Cardiovascular:  Negative for chest pain and palpitations.  Gastrointestinal:  Positive for diarrhea. Negative for blood in stool and constipation.  Endocrine: Positive for increased urination.  Genitourinary:  Positive for nocturia. Negative for involuntary urination.  Musculoskeletal:  Positive for joint pain, joint pain, joint swelling, myalgias, muscle weakness, morning stiffness, muscle tenderness and myalgias. Negative for gait problem.  Skin:  Positive for rash. Negative for color change, hair loss and sensitivity to sunlight.       Rash on back   Allergic/Immunologic: Negative for susceptible to infections.  Neurological:  Positive for headaches. Negative for dizziness.  Hematological:  Negative for swollen glands.  Psychiatric/Behavioral:  Positive for sleep disturbance. Negative for depressed mood. The patient is not nervous/anxious.  PMFS History:  Patient Active Problem List   Diagnosis Date Noted   Acute non-recurrent pansinusitis 10/24/2023   Chronic pain of both shoulders 07/15/2023   Morbid obesity (HCC) 07/15/2023   Hyperlipidemia associated with type 2 diabetes mellitus (HCC) 12/18/2022   Skin rash 10/15/2022   Cerebrovascular accident (CVA) (HCC) 06/29/2022   Acute cerebrovascular accident (CVA) (HCC) 06/22/2022   Hyperglycemia 12/11/2021   Swelling 02/02/2021   Right lower  quadrant abdominal pain 02/02/2021   History of COVID-19 10/17/2020   Shortness of breath 10/17/2020   COVID-19 10/12/2020   Dizziness 05/23/2020   Primary hypertension 06/11/2019   New daily persistent headache 06/11/2019   Memory loss 06/11/2019   Upper airway cough syndrome vs cough variant asthma 01/01/2019   Chest pain 12/09/2018   Prediabetes 10/07/2018   Preventative health care 08/07/2018   Anxiety 10/11/2017   S/P partial colectomy 06/18/2017   Multiple drug allergies 06/18/2017   Colitis 02/05/2017   Diarrhea 02/05/2017   Diverticular disease 01/23/2017   Helicobacter pylori (H. pylori) infection 12/13/2016   Routine screening for STI (sexually transmitted infection) 12/13/2016   Clostridium difficile colitis 12/13/2016   Diverticulitis 10/10/2016   Lower abdominal pain 09/29/2016   Palpitations 02/06/2016   Mild diastolic dysfunction 02/06/2016   Graves disease 01/02/2016   Acute diverticulitis 12/07/2015   Acute bacterial sinusitis 10/04/2015   History of colonic polyps 11/15/2014   Left shoulder pain 09/28/2013   Left-sided weakness 09/16/2013   Obesity (BMI 30-39.9) with low erv on pfts 09/25/19 09/02/2013   Myalgia 06/21/2011   Asymptomatic postmenopausal status 09/07/2010   PAIN IN JOINT, MULTIPLE SITES 12/27/2009   LOW BACK PAIN, CHRONIC 12/27/2009   FATIGUE 12/27/2009   DYSPEPSIA&OTHER SPEC DISORDERS FUNCTION STOMACH 09/01/2009   NAUSEA 08/03/2009   FLATULENCE-GAS-BLOATING 08/03/2009   ABDOMINAL PAIN RIGHT UPPER QUADRANT 07/06/2009   SUPRAPUBIC PAIN 07/06/2009   GANGLION CYST, HX OF 07/06/2009   BACK PAIN 05/04/2009   Hyperlipidemia 09/16/2008   UNSPECIFIED MYALGIA AND MYOSITIS 09/16/2008   Precordial pain 07/27/2008   GERD 06/08/2008   HELICOBACTER PYLORI GASTRITIS, HX OF 04/05/2008   OTHER ACUTE REACTIONS TO STRESS 03/16/2008   ABDOMINAL PAIN, EPIGASTRIC 03/16/2008   SHOULDER PAIN, RIGHT 10/01/2007   ELBOW PAIN, RIGHT 10/01/2007   Pain in limb  04/14/2007   DEPENDENT EDEMA, RIGHT LEG 04/14/2007   SHINGLES 04/08/2007   Asthma 03/18/2007   GANGLION CYST, WRIST, RIGHT 03/18/2007   BUNIONECTOMY, HX OF 03/18/2007    Past Medical History:  Diagnosis Date   Allergy    Anxiety    Asthma    pt has inhaler   Back pain    C. difficile colitis    Cataract    bil eyes   Chest pain    Chronic fatigue syndrome    Clostridium difficile colitis 12/13/2016   Diverticulitis    Dyspnea    Dysrhythmia    palpitations   Edema    Elevated blood pressure reading without diagnosis of hypertension    "just elevated when I'm in pain" (12/07/2015)   Fatty liver    Fibromyalgia    Ganglion of joint    right wrist   GERD (gastroesophageal reflux disease)    H. pylori infection    Headache    hx of   Helicobacter pylori (H. pylori) infection 12/13/2016   Herpes zoster without mention of complication    HTN (hypertension)    Hyperglycemia 08/07/2018   Hyperlipidemia    Hyperthyroidism    IBS (irritable  bowel syndrome)    Leg edema    Multiple drug allergies 06/18/2017   Myalgia    Nontraumatic rupture of Achilles tendon    Other acute reactions to stress    Pain in joint, shoulder region    Pain in joint, upper arm    Pain in limb    Palpitations    Personal history of other diseases of digestive system    Pneumonia    once   PONV (postoperative nausea and vomiting)    Routine screening for STI (sexually transmitted infection) 12/13/2016   S/P partial colectomy 06/18/2017   Thyroid  disease    hyperthyroidism   Vitamin B 12 deficiency     Family History  Problem Relation Age of Onset   Hyperlipidemia Mother    Hypertension Mother        Died, 7   Thyroid  disease Mother        Thyroid  surgery   Obesity Mother    Heart disease Mother    Hypertension Father    Diabetes Father    Heart disease Father    Kidney disease Father        Died, 72   Hyperlipidemia Father    Pernicious anemia Sister    Thyroid  disease Sister         On thyroid  Rx   Atrial fibrillation Sister    Other Sister        hypoglycemia   Leukemia Brother    Cancer Brother        myleoblastic anemia   Diabetes Brother    Coronary artery disease Brother    Hypertension Brother    Aplastic anemia Daughter    Hypertension Daughter    Migraines Daughter    GER disease Daughter    Bipolar disorder Daughter    Schizophrenia Daughter    Colon cancer Neg Hx    Esophageal cancer Neg Hx    Rectal cancer Neg Hx    Stomach cancer Neg Hx    Pancreatic cancer Neg Hx    Prostate cancer Neg Hx    Colon polyps Neg Hx    Past Surgical History:  Procedure Laterality Date   ABDOMINAL HYSTERECTOMY     ACHILLES TENDON REPAIR Right 2005   BUBBLE STUDY  07/09/2022   Procedure: BUBBLE STUDY;  Surgeon: Sheryle Donning, MD;  Location: [facility] ENDOSCOPY;  Service: Cardiovascular;;   BUNIONECTOMY Bilateral    Bunionectomy 1983   COLON SURGERY  01/23/2017   6 to 8 inches sigmoid colon removed   COLONOSCOPY     GANGLION CYST EXCISION Right    wrist   LEFT HEART CATH AND CORONARY ANGIOGRAPHY N/A 10/03/2016   Procedure: Left Heart Cath and Coronary Angiography;  Surgeon: Odie Benne, MD;  Location: [facility] INVASIVE CV LAB;  Service: Cardiovascular;  Laterality: N/A;   LOOP RECORDER IMPLANT  08/2021   MENISCUS REPAIR Right 04/2014   POLYPECTOMY     TEE WITHOUT CARDIOVERSION N/A 07/09/2022   Procedure: TRANSESOPHAGEAL ECHOCARDIOGRAM (TEE);  Surgeon: Sheryle Donning, MD;  Location: [facility] ENDOSCOPY;  Service: Cardiovascular;  Laterality: N/A;   TUBAL LIGATION     wisdomteeth extraction     Social History   Social History Narrative   Lives with alone.  She has two daughter.   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   Right Handed   Caffeine 12 oz to 24 oz a day   Immunization History  Administered Date(s) Administered   Hepatitis  A, Adult 06/20/2017   IPV 07/01/2017   Influenza Whole 05/05/2008   Meningococcal  Mcv4o 06/20/2017   PFIZER(Purple Top)SARS-COV-2 Vaccination 09/01/2019, 09/22/2019, 05/23/2020   PPD Test 07/02/2011, 07/07/2012, 07/13/2013, 07/06/2014, 08/11/2015, 09/15/2016   Pfizer Covid-19 Vaccine Bivalent Booster 69yrs & up 10/19/2021   Pfizer(Comirnaty )Fall Seasonal Vaccine 12 years and older 10/15/2022   Pneumococcal Polysaccharide-23 09/21/2011   Td 09/27/1999, 09/07/2010   Tdap 07/01/2017   Typhoid Inactivated 06/17/2017   Zoster Recombinant(Shingrix) 06/10/2017, 12/16/2017   Zoster, Live 06/20/2012     Objective: Vital Signs: BP 123/82 (BP Location: Right Arm, Patient Position: Sitting, Cuff Size: Normal)   Pulse 71   Resp 14   Ht 5\' 5"  (1.651 m)   Wt 246 lb 12.8 oz (111.9 kg)   BMI 41.07 kg/m    Physical Exam Vitals and nursing note reviewed.  Constitutional:      Appearance: She is well-developed.  HENT:     Head: Normocephalic and atraumatic.  Eyes:     Conjunctiva/sclera: Conjunctivae normal.  Cardiovascular:     Rate and Rhythm: Normal rate and regular rhythm.     Heart sounds: Normal heart sounds.  Pulmonary:     Effort: Pulmonary effort is normal.     Breath sounds: Normal breath sounds.  Abdominal:     General: Bowel sounds are normal.     Palpations: Abdomen is soft.  Musculoskeletal:     Cervical back: Normal range of motion.  Lymphadenopathy:     Cervical: No cervical adenopathy.  Skin:    General: Skin is warm and dry.     Capillary Refill: Capillary refill takes less than 2 seconds.  Neurological:     Mental Status: She is alert and oriented to person, place, and time.  Psychiatric:        Behavior: Behavior normal.      Musculoskeletal Exam: Cervical spine was in good range of motion.  She had no tenderness over thoracic or lumbar spine.  She had painful range of motion of bilateral shoulder joints with discomfort over right subacromial region.  Elbow joints and wrist joints with good range of motion.  She had bilateral PIP and DIP  thickening and CMC prominence without any synovitis.  Hip joints were in good range of motion.  She had tenderness over bilateral trochanteric region.  She had good range of motion bilateral knee joints without any warmth swelling or effusion.  There was no tenderness over ankles or MTPs.  No plantar fasciitis or Achilles tendinitis was noted.  She had a scars from bilateral bunionectomy.  CDAI Exam: CDAI Score: -- Patient Global: --; Provider Global: -- Swollen: --; Tender: -- Joint Exam 12/12/2023   No joint exam has been documented for this visit   There is currently no information documented on the homunculus. Go to the Rheumatology activity and complete the homunculus joint exam.  Investigation: No additional findings.  Imaging: CUP PACEART REMOTE DEVICE CHECK Result Date: 12/09/2023 ILR summary report received. Battery status OK. Normal device function. No new symptom, tachy, brady, or pause episodes. No new AF episodes. Monthly summary reports and ROV/PRN. MC, CVRS  DG Knee Complete 4 Views Right Result Date: 11/23/2023 CLINICAL DATA:  Right knee injury, pain EXAM: RIGHT KNEE - COMPLETE 4+ VIEW COMPARISON:  None Available. FINDINGS: Normal alignment. No acute fracture or dislocation. Mild tricompartmental degenerative arthritis, most severe within the medial and lateral compartments. No effusion. Soft tissues are unremarkable. IMPRESSION: 1. Mild tricompartmental degenerative arthritis. Electronically Signed  By: Worthy Heads M.D.   On: 11/23/2023 19:20    Recent Labs: Lab Results  Component Value Date   WBC 5.5 04/29/2023   HGB 13.9 04/29/2023   PLT 266.0 04/29/2023   NA 142 04/29/2023   K 4.0 04/29/2023   CL 103 04/29/2023   CO2 33 (H) 04/29/2023   GLUCOSE 79 04/29/2023   BUN 11 04/29/2023   CREATININE 0.96 04/29/2023   BILITOT 0.5 04/29/2023   ALKPHOS 87 04/29/2023   AST 24 04/29/2023   ALT 27 04/29/2023   PROT 7.5 04/29/2023   ALBUMIN 4.5 04/29/2023   CALCIUM   9.8 04/29/2023   GFRAA >60 11/27/2018   QFTBGOLDPLUS NEGATIVE 01/23/2022   April 29, 2023 lipid panel normal, vitamin D23.40, vitamin B12> 1501 July 15, 2023  ANA 1: 160 NH, RF negative, sed rate 10 October 16, 2023 TSH normal Speciality Comments: No specialty comments available.  Procedures:  No procedures performed Allergies: Aspirin, Gentamicin, Ibuprofen, Metronidazole, Nsaids, Augmentin  [amoxicillin -pot clavulanate], Doxycycline , Influenza a (h1n1) monoval vac, Loratadine , Metformin  and related, Periactin [cyproheptadine], Ranitidine hcl, Sulfamethoxazole-trimethoprim, and Tramadol hcl   Assessment / Plan:     Visit Diagnoses: Positive ANA (antinuclear antibody) -she has positive ANA.  She gives history of arthralgias, myalgias, fatigue, dry eyes.  There is no history of oral ulcers, nasal ulcers, malar rash, photosensitivity, Raynaud's or lymphadenopathy.  There is no history of inflammatory arthritis.  I will obtain additional labs today.  Plan: Protein / creatinine ratio, urine, CBC with Differential/Platelet, Comprehensive metabolic panel with GFR, ANA, Anti-scleroderma antibody, RNP Antibody, Anti-Smith antibody, Sjogrens syndrome-A extractable nuclear antibody, Sjogrens syndrome-B extractable nuclear antibody, Anti-DNA antibody, double-stranded, C3 and C4, Beta-2 glycoprotein antibodies, Cardiolipin antibodies, IgG, IgM, IgA.  I will discuss results at the follow-up visit.  Polyarthralgia-she complains of pain and discomfort for many years.  She was diagnosed with fibromyalgia syndrome 20 years ago.  She complains of discomfort in her shoulders, hands, hips, knees and her lower back..  Chronic pain of both shoulders-she has been experiencing pain in her both shoulders for many years.  She has been evaluated by Dr. Rozelle Corning.  I reviewed x-rays of her right shoulder joint from November 2024 which were unremarkable.  A handout on shoulder joint exercises was given.  I also offered  physical therapy but she would like to wait for now.  Pain in both hands -she complains of discomfort in the bilateral hands.  No synovitis was noted.  Bilateral PIP and DIP thickening was noted.  No synovitis was noted.  Plan: XR Hand 2 View Right, XR Hand 2 View Left, x-rays of bilateral hands were suggestive of osteoarthritis.  Cyclic citrul peptide antibody, IgG.  A handout on hand exercises was given.  Trochanteric bursitis, left hip-she had tenderness over left trochanteric bursa.  A handout on IT band stretches was given.  Chronic pain of both knees-she complains of pain and discomfort in the bilateral knee joints.  No warmth swelling or effusion was noted.  X-rays of her knee joints from April 2025 were reviewed which showed moderate osteoarthritis and chondromalacia patella.  Fibromyalgia -she gives history of fibromyalgia for many years.  She gives history of generalized pain, hyperalgesia.  She had positive tender points.  Plan: CK.  Need for regular exercise and stretching was discussed.  Other fatigue -she gives history of chronic fatigue and episodic increased fatigue.  Plan: Serum protein electrophoresis with reflex  Vitamin D  deficiency -she has been taking vitamin D  50,000 units once a week  and 2000 units daily.  Vitamin D  was low at 23.4 in September 2024.  I will check vitamin D  level again today.  Plan: VITAMIN D  25 Hydroxy (Vit-D Deficiency, Fractures)  Other medical problems are listed as follows:  History of CVA (cerebrovascular accident) - 11/22 with left sided weakness- resolved  Hyperlipidemia associated with type 2 diabetes mellitus (HCC)  Primary hypertension-blood pressure was normal at 123/82.  Mild diastolic dysfunction  Prediabetes  History of diverticulitis  S/P partial colectomy  Clostridium difficile colitis  Helicobacter pylori (H. pylori) infection  Mild intermittent asthma without complication  Memory loss  Anxiety  Morbid obesity  (HCC)  Orders: Orders Placed This Encounter  Procedures   XR Hand 2 View Right   XR Hand 2 View Left   Protein / creatinine ratio, urine   CBC with Differential/Platelet   Comprehensive metabolic panel with GFR   CK   VITAMIN D  25 Hydroxy (Vit-D Deficiency, Fractures)   Cyclic citrul peptide antibody, IgG   ANA   Anti-scleroderma antibody   RNP Antibody   Anti-Smith antibody   Sjogrens syndrome-A extractable nuclear antibody   Sjogrens syndrome-B extractable nuclear antibody   Anti-DNA antibody, double-stranded   C3 and C4   Beta-2 glycoprotein antibodies   Cardiolipin antibodies, IgG, IgM, IgA   Serum protein electrophoresis with reflex   No orders of the defined types were placed in this encounter.    Follow-Up Instructions: Return for Positive ANA, arthralgia, myalgia.   Nicholas Bari, MD  Note - This record has been created using Animal nutritionist.  Chart creation errors have been sought, but may not always  have been located. Such creation errors do not reflect on  the standard of medical care.

## 2023-12-09 ENCOUNTER — Ambulatory Visit: Payer: No Typology Code available for payment source

## 2023-12-09 DIAGNOSIS — I639 Cerebral infarction, unspecified: Secondary | ICD-10-CM

## 2023-12-09 LAB — CUP PACEART REMOTE DEVICE CHECK
Date Time Interrogation Session: 20250420231553
Implantable Pulse Generator Implant Date: 20231219

## 2023-12-12 ENCOUNTER — Encounter: Payer: Self-pay | Admitting: Rheumatology

## 2023-12-12 ENCOUNTER — Ambulatory Visit (INDEPENDENT_AMBULATORY_CARE_PROVIDER_SITE_OTHER)

## 2023-12-12 ENCOUNTER — Ambulatory Visit

## 2023-12-12 ENCOUNTER — Ambulatory Visit: Payer: No Typology Code available for payment source | Attending: Rheumatology | Admitting: Rheumatology

## 2023-12-12 VITALS — BP 123/82 | HR 71 | Resp 14 | Ht 65.0 in | Wt 246.8 lb

## 2023-12-12 DIAGNOSIS — A0472 Enterocolitis due to Clostridium difficile, not specified as recurrent: Secondary | ICD-10-CM

## 2023-12-12 DIAGNOSIS — R768 Other specified abnormal immunological findings in serum: Secondary | ICD-10-CM | POA: Diagnosis not present

## 2023-12-12 DIAGNOSIS — J4520 Mild intermittent asthma, uncomplicated: Secondary | ICD-10-CM

## 2023-12-12 DIAGNOSIS — E1169 Type 2 diabetes mellitus with other specified complication: Secondary | ICD-10-CM

## 2023-12-12 DIAGNOSIS — M25511 Pain in right shoulder: Secondary | ICD-10-CM | POA: Diagnosis not present

## 2023-12-12 DIAGNOSIS — Z8673 Personal history of transient ischemic attack (TIA), and cerebral infarction without residual deficits: Secondary | ICD-10-CM

## 2023-12-12 DIAGNOSIS — M2550 Pain in unspecified joint: Secondary | ICD-10-CM

## 2023-12-12 DIAGNOSIS — Z9049 Acquired absence of other specified parts of digestive tract: Secondary | ICD-10-CM

## 2023-12-12 DIAGNOSIS — M25561 Pain in right knee: Secondary | ICD-10-CM

## 2023-12-12 DIAGNOSIS — M7062 Trochanteric bursitis, left hip: Secondary | ICD-10-CM

## 2023-12-12 DIAGNOSIS — E559 Vitamin D deficiency, unspecified: Secondary | ICD-10-CM

## 2023-12-12 DIAGNOSIS — M797 Fibromyalgia: Secondary | ICD-10-CM

## 2023-12-12 DIAGNOSIS — I10 Essential (primary) hypertension: Secondary | ICD-10-CM

## 2023-12-12 DIAGNOSIS — Z8719 Personal history of other diseases of the digestive system: Secondary | ICD-10-CM

## 2023-12-12 DIAGNOSIS — E785 Hyperlipidemia, unspecified: Secondary | ICD-10-CM

## 2023-12-12 DIAGNOSIS — M79641 Pain in right hand: Secondary | ICD-10-CM

## 2023-12-12 DIAGNOSIS — M79642 Pain in left hand: Secondary | ICD-10-CM

## 2023-12-12 DIAGNOSIS — R531 Weakness: Secondary | ICD-10-CM

## 2023-12-12 DIAGNOSIS — M25562 Pain in left knee: Secondary | ICD-10-CM

## 2023-12-12 DIAGNOSIS — F419 Anxiety disorder, unspecified: Secondary | ICD-10-CM

## 2023-12-12 DIAGNOSIS — M25512 Pain in left shoulder: Secondary | ICD-10-CM

## 2023-12-12 DIAGNOSIS — R7303 Prediabetes: Secondary | ICD-10-CM

## 2023-12-12 DIAGNOSIS — A048 Other specified bacterial intestinal infections: Secondary | ICD-10-CM

## 2023-12-12 DIAGNOSIS — R5383 Other fatigue: Secondary | ICD-10-CM

## 2023-12-12 DIAGNOSIS — R413 Other amnesia: Secondary | ICD-10-CM

## 2023-12-12 DIAGNOSIS — I519 Heart disease, unspecified: Secondary | ICD-10-CM

## 2023-12-12 DIAGNOSIS — G8929 Other chronic pain: Secondary | ICD-10-CM

## 2023-12-12 NOTE — Patient Instructions (Signed)
 Hand Exercises Hand exercises can be helpful for almost anyone. They can strengthen your hands and improve flexibility and movement. The exercises can also increase blood flow to the hands. These results can make your work and daily tasks easier for you. Hand exercises can be especially helpful for people who have joint pain from arthritis or nerve damage from using their hands over and over. These exercises can also help people who injure a hand. Exercises Most of these hand exercises are gentle stretching and motion exercises. It is usually safe to do them often throughout the day. Warming up your hands before exercise may help reduce stiffness. You can do this with gentle massage or by placing your hands in warm water for 10-15 minutes. It is normal to feel some stretching, pulling, tightness, or mild discomfort when you begin new exercises. In time, this will improve. Remember to always be careful and stop right away if you feel sudden, very bad pain or your pain gets worse. You want to get better and be safe. Ask your health care provider which exercises are safe for you. Do exercises exactly as told by your provider and adjust them as told. Do not begin these exercises until told by your provider. Knuckle bend or "claw" fist  Stand or sit with your arm, hand, and all five fingers pointed straight up. Make sure to keep your wrist straight. Gently bend your fingers down toward your palm until the tips of your fingers are touching your palm. Keep your big knuckle straight and only bend the small knuckles in your fingers. Hold this position for 10 seconds. Straighten your fingers back to your starting position. Repeat this exercise 5-10 times with each hand. Full finger fist  Stand or sit with your arm, hand, and all five fingers pointed straight up. Make sure to keep your wrist straight. Gently bend your fingers into your palm until the tips of your fingers are touching the middle of your  palm. Hold this position for 10 seconds. Extend your fingers back to your starting position, stretching every joint fully. Repeat this exercise 5-10 times with each hand. Straight fist  Stand or sit with your arm, hand, and all five fingers pointed straight up. Make sure to keep your wrist straight. Gently bend your fingers at the big knuckle, where your fingers meet your hand, and at the middle knuckle. Keep the knuckle at the tips of your fingers straight and try to touch the bottom of your palm. Hold this position for 10 seconds. Extend your fingers back to your starting position, stretching every joint fully. Repeat this exercise 5-10 times with each hand. Tabletop  Stand or sit with your arm, hand, and all five fingers pointed straight up. Make sure to keep your wrist straight. Gently bend your fingers at the big knuckle, where your fingers meet your hand, as far down as you can. Keep the small knuckles in your fingers straight. Think of forming a tabletop with your fingers. Hold this position for 10 seconds. Extend your fingers back to your starting position, stretching every joint fully. Repeat this exercise 5-10 times with each hand. Finger spread  Place your hand flat on a table with your palm facing down. Make sure your wrist stays straight. Spread your fingers and thumb apart from each other as far as you can until you feel a gentle stretch. Hold this position for 10 seconds. Bring your fingers and thumb tight together again. Hold this position for 10 seconds. Repeat  this exercise 5-10 times with each hand. Making circles  Stand or sit with your arm, hand, and all five fingers pointed straight up. Make sure to keep your wrist straight. Make a circle by touching the tip of your thumb to the tip of your index finger. Hold for 10 seconds. Then open your hand wide. Repeat this motion with your thumb and each of your fingers. Repeat this exercise 5-10 times with each hand. Thumb  motion  Sit with your forearm resting on a table and your wrist straight. Your thumb should be facing up toward the ceiling. Keep your fingers relaxed as you move your thumb. Lift your thumb up as high as you can toward the ceiling. Hold for 10 seconds. Bend your thumb across your palm as far as you can, reaching the tip of your thumb for the small finger (pinkie) side of your palm. Hold for 10 seconds. Repeat this exercise 5-10 times with each hand. Grip strengthening  Hold a stress ball or other soft ball in the middle of your hand. Slowly increase the pressure, squeezing the ball as much as you can without causing pain. Think of bringing the tips of your fingers into the middle of your palm. All of your finger joints should bend when doing this exercise. Hold your squeeze for 10 seconds, then relax. Repeat this exercise 5-10 times with each hand. Contact a health care provider if: Your hand pain or discomfort gets much worse when you do an exercise. Your hand pain or discomfort does not improve within 2 hours after you exercise. If you have either of these problems, stop doing these exercises right away. Do not do them again unless your provider says that you can. Get help right away if: You develop sudden, severe hand pain or swelling. If this happens, stop doing these exercises right away. Do not do them again unless your provider says that you can. This information is not intended to replace advice given to you by your health care provider. Make sure you discuss any questions you have with your health care provider. Document Revised: 08/21/2022 Document Reviewed: 08/21/2022 [employer] Patient Education  2024 [employer] Shoulder Exercises Ask your health care provider which exercises are safe for you. Do exercises exactly as told by your health care provider and adjust them as directed. It is normal to feel mild stretching, pulling, tightness, or discomfort as you do these exercises.  Stop right away if you feel sudden pain or your pain gets worse. Do not begin these exercises until told by your health care provider. Stretching exercises External rotation and abduction This exercise is sometimes called corner stretch. The exercise rotates your arm outward (external rotation) and moves your arm out from your body (abduction). Stand in a doorway with one of your feet slightly in front of the other. This is called a staggered stance. If you cannot reach your forearms to the door frame, stand facing a corner of a room. Choose one of the following positions as told by your health care provider: Place your hands and forearms on the door frame above your head. Place your hands and forearms on the door frame at the height of your head. Place your hands on the door frame at the height of your elbows. Slowly move your weight onto your front foot until you feel a stretch across your chest and in the front of your shoulders. Keep your head and chest upright and keep your abdominal muscles tight. Hold for __________  seconds. To release the stretch, shift your weight to your back foot. Repeat __________ times. Complete this exercise __________ times a day. Extension, standing  Stand and hold a broomstick, a cane, or a similar object behind your back. Your hands should be a little wider than shoulder-width apart. Your palms should face away from your back. Keeping your elbows straight and your shoulder muscles relaxed, move the stick away from your body until you feel a stretch in your shoulders (extension). Avoid shrugging your shoulders while you move the stick. Keep your shoulder blades tucked down toward the middle of your back. Hold for __________ seconds. Slowly return to the starting position. Repeat __________ times. Complete this exercise __________ times a day. Range-of-motion exercises Pendulum  Stand near a wall or a surface that you can hold onto for balance. Bend at the  waist and let your left / right arm hang straight down. Use your other arm to support you. Keep your back straight and do not lock your knees. Relax your left / right arm and shoulder muscles, and move your hips and your trunk so your left / right arm swings freely. Your arm should swing because of the motion of your body, not because you are using your arm or shoulder muscles. Keep moving your hips and trunk so your arm swings in the following directions, as told by your health care provider: Side to side. Forward and backward. In clockwise and counterclockwise circles. Continue each motion for __________ seconds, or for as long as told by your health care provider. Slowly return to the starting position. Repeat __________ times. Complete this exercise __________ times a day. Shoulder flexion, standing  Stand and hold a broomstick, a cane, or a similar object. Place your hands a little more than shoulder-width apart on the object. Your left / right hand should be palm-up, and your other hand should be palm-down. Keep your elbow straight and your shoulder muscles relaxed. Push the stick up with your healthy arm to raise your left / right arm in front of your body, and then over your head until you feel a stretch in your shoulder (flexion). Avoid shrugging your shoulder while you raise your arm. Keep your shoulder blade tucked down toward the middle of your back. Hold for __________ seconds. Slowly return to the starting position. Repeat __________ times. Complete this exercise __________ times a day. Shoulder abduction, standing  Stand and hold a broomstick, a cane, or a similar object. Place your hands a little more than shoulder-width apart on the object. Your left / right hand should be palm-up, and your other hand should be palm-down. Keep your elbow straight and your shoulder muscles relaxed. Push the object across your body toward your left / right side. Raise your left / right arm to the  side of your body (abduction) until you feel a stretch in your shoulder. Do not raise your arm above shoulder height unless your health care provider tells you to do that. If directed, raise your arm over your head. Avoid shrugging your shoulder while you raise your arm. Keep your shoulder blade tucked down toward the middle of your back. Hold for __________ seconds. Slowly return to the starting position. Repeat __________ times. Complete this exercise __________ times a day. Internal rotation  Place your left / right hand behind your back, palm-up. Use your other hand to dangle an exercise band, a broomstick, or a similar object over your shoulder. Grasp the band with your left / right  hand so you are holding on to both ends. Gently pull up on the band until you feel a stretch in the front of your left / right shoulder. The movement of your arm toward the center of your body is called internal rotation. Avoid shrugging your shoulder while you raise your arm. Keep your shoulder blade tucked down toward the middle of your back. Hold for __________ seconds. Release the stretch by letting go of the band and lowering your hands. Repeat __________ times. Complete this exercise __________ times a day. Strengthening exercises External rotation  Sit in a stable chair without armrests. Secure an exercise band to a stable object at elbow height on your left / right side. Place a soft object, such as a folded towel or a small pillow, between your left / right upper arm and your body to move your elbow about 4 inches (10 cm) away from your side. Hold the end of the exercise band so it is tight and there is no slack. Keeping your elbow pressed against the soft object, slowly move your forearm out, away from your abdomen (external rotation). Keep your body steady so only your forearm moves. Hold for __________ seconds. Slowly return to the starting position. Repeat __________ times. Complete this  exercise __________ times a day. Shoulder abduction  Sit in a stable chair without armrests, or stand up. Hold a __________ lb / kg weight in your left / right hand, or hold an exercise band with both hands. Start with your arms straight down and your left / right palm facing in, toward your body. Slowly lift your left / right hand out to your side (abduction). Do not lift your hand above shoulder height unless your health care provider tells you that this is safe. Keep your arms straight. Avoid shrugging your shoulder while you do this movement. Keep your shoulder blade tucked down toward the middle of your back. Hold for __________ seconds. Slowly lower your arm, and return to the starting position. Repeat __________ times. Complete this exercise __________ times a day. Shoulder extension  Sit in a stable chair without armrests, or stand up. Secure an exercise band to a stable object in front of you so it is at shoulder height. Hold one end of the exercise band in each hand. Straighten your elbows and lift your hands up to shoulder height. Squeeze your shoulder blades together as you pull your hands down to the sides of your thighs (extension). Stop when your hands are straight down by your sides. Do not let your hands go behind your body. Hold for __________ seconds. Slowly return to the starting position. Repeat __________ times. Complete this exercise __________ times a day. Shoulder row  Sit in a stable chair without armrests, or stand up. Secure an exercise band to a stable object in front of you so it is at chest height. Hold one end of the exercise band in each hand. Position your palms so that your thumbs are facing the ceiling (neutral position). Bend each of your elbows to a 90-degree angle (right angle) and keep your upper arms at your sides. Step back or move the chair back until the band is tight and there is no slack. Slowly pull your elbows back behind you. Hold for  __________ seconds. Slowly return to the starting position. Repeat __________ times. Complete this exercise __________ times a day. Shoulder press-ups  Sit in a stable chair that has armrests. Sit upright, with your feet flat on the floor.  Put your hands on the armrests so your elbows are bent and your fingers are pointing forward. Your hands should be about even with the sides of your body. Push down on the armrests and use your arms to lift yourself off the chair. Straighten your elbows and lift yourself up as much as you comfortably can. Move your shoulder blades down, and avoid letting your shoulders move up toward your ears. Keep your feet on the ground. As you get stronger, your feet should support less of your body weight as you lift yourself up. Hold for __________ seconds. Slowly lower yourself back into the chair. Repeat __________ times. Complete this exercise __________ times a day. Wall push-ups  Stand so you are facing a stable wall. Your feet should be about one arm-length away from the wall. Lean forward and place your palms on the wall at shoulder height. Keep your feet flat on the floor as you bend your elbows and lean forward toward the wall. Hold for __________ seconds. Straighten your elbows to push yourself back to the starting position. Repeat __________ times. Complete this exercise __________ times a day. This information is not intended to replace advice given to you by your health care provider. Make sure you discuss any questions you have with your health care provider. Document Revised: 09/26/2021 Document Reviewed: 09/26/2021 [employer] Patient Education  2024 [employer] Exercises for Chronic Knee Pain Chronic knee pain is pain that lasts longer than 3 months. For most people with chronic knee pain, exercise and weight loss is an important part of treatment. Your health care provider may want you to focus on: Making the muscles that support your knee  stronger. This can take pressure off your knee and reduce pain. Preventing knee stiffness. How far you can move your knee, keeping it there or making it farther. Losing weight (if this applies) to take pressure off your knee, lower your risk for injury, and make it easier for you to exercise. Your provider will help you make an exercise program that fits your needs and physical abilities. Below are simple, low-impact exercises you can do at home. Ask your provider or physical therapist how often you should do your exercise program and how many times to repeat each exercise. General safety tips  Get your provider's approval before doing any exercises. Start slowly and stop any time you feel pain. Do not exercise if your knee pain is flaring up. Warm up first. Stretching a cold muscle can cause an injury. Do 5-10 minutes of easy movement or light stretching before beginning your exercises. Do 5-10 minutes of low-impact activity (like walking or cycling) before starting strengthening exercises. Contact your provider any time you have pain during or after exercising. Exercise can cause discomfort but should not be painful. It is normal to be a little stiff or sore after exercising. Stretching and range-of-motion exercises Front thigh stretch  Stand up straight and support your body by holding on to a chair or resting one hand on a wall. With your legs straight and close together, bend one knee to lift your heel up toward your butt. Using one hand for support, grab your ankle with your free hand. Pull your foot up closer toward your butt to feel the stretch in front of your thigh. Hold the stretch for 30 seconds. Repeat __________ times. Complete this exercise __________ times a day. Back thigh stretch  Sit on the floor with your back straight and your legs out straight in front of  you. Place the palms of your hands on the floor and slide them toward your feet as you bend at the hip. Try to  touch your nose to your knees and feel the stretch in the back of your thighs. Hold for 30 seconds. Repeat __________ times. Complete this exercise __________ times a day. Calf stretch  Stand facing a wall. Place the palms of your hands flat against the wall, arms extended, and lean slightly against the wall. Get into a lunge position with one leg bent at the knee and the other leg stretched out straight behind you. Keep both feet facing the wall and increase the bend in your knee while keeping the heel of the other leg flat on the ground. You should feel the stretch in your calf. Hold for 30 seconds. Repeat __________ times. Complete this exercise __________ times a day. Strengthening exercises Straight leg lift  Lie on your back with one knee bent and the other leg out straight. Slowly lift the straight leg without bending the knee. Lift until your foot is about 12 inches (30 cm) off the floor. Hold for 3-5 seconds and slowly lower your leg. Repeat __________ times. Complete this exercise __________ times a day. Single leg dip  Stand between two chairs and put both hands on the backs of the chairs for support. Extend one leg out straight with your body weight resting on the heel of the standing leg. Slowly bend your standing knee to dip your body to the level that is comfortable for you. Hold for 3-5 seconds. Repeat __________ times. Complete this exercise __________ times a day. Hamstring curls  Stand straight, knees close together, facing the back of a chair. Hold on to the back of a chair with both hands. Keep one leg straight. Bend the other knee while bringing the heel up toward the butt until the knee is bent at a 90-degree angle (right angle). Hold for 3-5 seconds. Repeat __________ times. Complete this exercise __________ times a day. Wall squat  Stand straight with your back, hips, and head against a wall. Step forward one foot at a time with your back still against the  wall. Your feet should be 2 feet (61 cm) from the wall at shoulder width. Keeping your back, hips, and head against the wall, slide down the wall to as close to a sitting position as you can get. Hold for 5-10 seconds, then slowly slide back up. Repeat __________ times. Complete this exercise __________ times a day. Step-ups  Stand in front of a sturdy platform or stool that is about 6 inches (15 cm) high. Slowly step up with your left / right foot, keeping your knee in line with your hip and foot. Do not let your knee bend so far that you cannot see your toes. Hold on to a chair for balance, but do not use it for support. Slowly unlock your knee and lower yourself to the starting position. Repeat __________ times. Complete this exercise __________ times a day. Contact a health care provider if: Your exercises cause pain. Your pain is worse after you exercise. Your pain prevents you from doing your exercises. This information is not intended to replace advice given to you by your health care provider. Make sure you discuss any questions you have with your health care provider. Document Revised: 08/21/2022 Document Reviewed: 08/21/2022 [employer] Patient Education  2024 [employer] Iliotibial Band Syndrome Rehab Ask your health care provider which exercises are safe for you. Do exercises  exactly as told by your provider and adjust them as told. It's normal to feel mild stretching, pulling, tightness, or discomfort as you do these exercises. Stop right away if you feel sudden pain or your pain gets a lot worse. Do not begin these exercises until told by your provider. Stretching and range-of-motion exercises These exercises warm up your muscles and joints. They also improve the movement and flexibility of your hip and pelvis. Quadriceps stretch, prone  Lie face down (prone) on a firm surface like a bed or padded floor. Bend your left / right knee. Reach back to hold your ankle or pant leg. If  you can't reach your ankle or pant leg, use a belt looped around your foot and grab the belt instead. Gently pull your heel toward your butt. Your knee should not slide out to the side. You should feel a stretch in the front of your thigh and knee, also called the quadriceps. Hold this position for __________ seconds. Repeat __________ times. Complete this exercise __________ times a day. Iliotibial band stretch The iliotibial band is a strip of tissue that runs along the outside of your hip down to your knee. Lie on your side with your left / right leg on top. Bend both knees and grab your left / right ankle. Stretch out your bottom arm to help you balance. Slowly bring your top knee back so your thigh goes behind your back. Slowly lower your top leg toward the floor until you feel a gentle stretch on the outside of your left / right hip and thigh. If you don't feel a stretch and your knee won't go farther, place the heel of your other foot on top of your knee and pull your knee down toward the floor with your foot. Hold this position for __________ seconds. Repeat __________ times. Complete this exercise __________ times a day. Strengthening exercises These exercises build strength and endurance in your hip and pelvis. Endurance means your muscles can keep working even when they're tired. Straight leg raises, side-lying This exercise strengthens the muscles that rotate the leg at the hip and move it away from your body. These muscles are called hip abductors. Lie on your side with your left / right leg on top. Lie so your head, shoulder, hip, and knee line up. You can bend your bottom knee to help you balance. Roll your hips slightly forward so they're stacked directly over each other. Your left / right knee should face forward. Tense the muscles in your outer thigh and hip. Lift your top leg 4-6 inches (10-15 cm) off the ground. Hold this position for __________ seconds. Slowly lower your leg  back down to the starting position. Let your muscles fully relax before doing this exercise again. Repeat __________ times. Complete this exercise __________ times a day. Leg raises, prone This exercise strengthens the muscles that move the hips backward. These muscles are called hip extensors. Lie face down (prone) on your bed or a firm surface. You can put a pillow under your hips for comfort and to support your lower back. Bend your left / right knee so your foot points straight up toward the ceiling. Keep the other leg straight and behind you. Squeeze your butt muscles. Lift your left / right thigh off the firm surface. Do not let your back arch. Tense your thigh muscle as hard as you can without having more knee pain. Hold this position for __________ seconds. Slowly lower your leg to the starting  position. Allow your leg to relax all the way. Repeat __________ times. Complete this exercise __________ times a day. Hip hike  Stand sideways on a bottom step. Place your feet so that your left / right leg is on the step, and the other foot is hanging off the side. If you need support for balance, hold onto a railing or wall. Keep your knees straight and your abdomen square, meaning your hips are level. Then, lift your left / right hip up toward the ceiling. Slowly let your leg that's hanging off the step lower towards the floor. Your foot should get closer to the ground. Do not lean or bend your knees during this movement. Repeat __________ times. Complete this exercise __________ times a day. This information is not intended to replace advice given to you by your health care provider. Make sure you discuss any questions you have with your health care provider. Document Revised: 10/19/2022 Document Reviewed: 10/19/2022 Elsevier Patient Education  2024 [employer].

## 2023-12-16 LAB — COMPREHENSIVE METABOLIC PANEL WITH GFR
AG Ratio: 1.7 (calc) (ref 1.0–2.5)
ALT: 20 U/L (ref 6–29)
AST: 19 U/L (ref 10–35)
Albumin: 4.4 g/dL (ref 3.6–5.1)
Alkaline phosphatase (APISO): 64 U/L (ref 37–153)
BUN: 12 mg/dL (ref 7–25)
CO2: 26 mmol/L (ref 20–32)
Calcium: 9.5 mg/dL (ref 8.6–10.4)
Chloride: 107 mmol/L (ref 98–110)
Creat: 0.86 mg/dL (ref 0.50–1.05)
Globulin: 2.6 g/dL (ref 1.9–3.7)
Glucose, Bld: 83 mg/dL (ref 65–99)
Potassium: 4.2 mmol/L (ref 3.5–5.3)
Sodium: 141 mmol/L (ref 135–146)
Total Bilirubin: 0.5 mg/dL (ref 0.2–1.2)
Total Protein: 7 g/dL (ref 6.1–8.1)
eGFR: 76 mL/min/{1.73_m2} (ref >=60)

## 2023-12-16 LAB — CBC WITH DIFFERENTIAL/PLATELET
Absolute Lymphocytes: 2264 {cells}/uL (ref 850–3900)
Absolute Monocytes: 681 {cells}/uL (ref 200–950)
Basophils Absolute: 30 {cells}/uL (ref 0–200)
Basophils Relative: 0.4 %
Eosinophils Absolute: 141 {cells}/uL (ref 15–500)
Eosinophils Relative: 1.9 %
HCT: 41.9 % (ref 35.0–45.0)
Hemoglobin: 13.2 g/dL (ref 11.7–15.5)
MCH: 28.8 pg (ref 27.0–33.0)
MCHC: 31.5 g/dL — ABNORMAL LOW (ref 32.0–36.0)
MCV: 91.3 fL (ref 80.0–100.0)
MPV: 9.3 fL (ref 7.5–12.5)
Monocytes Relative: 9.2 %
Neutro Abs: 4285 {cells}/uL (ref 1500–7800)
Neutrophils Relative %: 57.9 %
Platelets: 241 10*3/uL (ref 140–400)
RBC: 4.59 10*6/uL (ref 3.80–5.10)
RDW: 14.2 % (ref 11.0–15.0)
Total Lymphocyte: 30.6 %
WBC: 7.4 10*3/uL (ref 3.8–10.8)

## 2023-12-16 LAB — C3 AND C4
C3 Complement: 147 mg/dL (ref 83–193)
C4 Complement: 26 mg/dL (ref 15–57)

## 2023-12-16 LAB — PROTEIN ELECTROPHORESIS, SERUM, WITH REFLEX
Albumin ELP: 4.1 g/dL (ref 3.8–4.8)
Alpha 1: 0.3 g/dL (ref 0.2–0.3)
Alpha 2: 0.7 g/dL (ref 0.5–0.9)
Beta 2: 0.4 g/dL (ref 0.2–0.5)
Beta Globulin: 0.4 g/dL (ref 0.4–0.6)
Gamma Globulin: 1.1 g/dL (ref 0.8–1.7)
Total Protein: 7 g/dL (ref 6.1–8.1)

## 2023-12-16 LAB — ANTI-NUCLEAR AB-TITER (ANA TITER)
ANA TITER: 1:160 {titer} — ABNORMAL HIGH
ANA Titer 1: 1:80 {titer} — ABNORMAL HIGH

## 2023-12-16 LAB — CARDIOLIPIN ANTIBODIES, IGG, IGM, IGA
Anticardiolipin IgA: <2 [APL'U]/mL (ref <20.0)
Anticardiolipin IgG: <2 [GPL'U]/mL (ref <20.0)
Anticardiolipin IgM: <2 [MPL'U]/mL (ref <20.0)

## 2023-12-16 LAB — BETA-2 GLYCOPROTEIN ANTIBODIES
Beta-2 Glyco 1 IgA: <2 U/mL (ref <20.0)
Beta-2 Glyco 1 IgM: <2 U/mL (ref <20.0)
Beta-2 Glyco I IgG: <2 U/mL (ref <20.0)

## 2023-12-16 LAB — RNP ANTIBODY: Ribonucleic Protein(ENA) Antibody, IgG: <1 AI

## 2023-12-16 LAB — PROTEIN / CREATININE RATIO, URINE
Creatinine, Urine: 104 mg/dL (ref 20–275)
Protein/Creat Ratio: 58 mg/g{creat} (ref 24–184)
Protein/Creatinine Ratio: 0.058 mg/mg{creat} (ref 0.024–0.184)
Total Protein, Urine: 6 mg/dL (ref 5–24)

## 2023-12-16 LAB — ANTI-SMITH ANTIBODY: ENA SM Ab Ser-aCnc: <1 AI

## 2023-12-16 LAB — ANTI-DNA ANTIBODY, DOUBLE-STRANDED: ds DNA Ab: <1 [IU]/mL

## 2023-12-16 LAB — CYCLIC CITRUL PEPTIDE ANTIBODY, IGG: Cyclic Citrullin Peptide Ab: 28 U — ABNORMAL HIGH

## 2023-12-16 LAB — ANTI-SCLERODERMA ANTIBODY: Scleroderma (Scl-70) (ENA) Antibody, IgG: <1 AI

## 2023-12-16 LAB — ANA: Anti Nuclear Antibody (ANA): POSITIVE — AB

## 2023-12-16 LAB — VITAMIN D 25 HYDROXY (VIT D DEFICIENCY, FRACTURES): Vit D, 25-Hydroxy: 80 ng/mL (ref 30–100)

## 2023-12-16 LAB — SJOGRENS SYNDROME-B EXTRACTABLE NUCLEAR ANTIBODY: SSB (La) (ENA) Antibody, IgG: <1 AI

## 2023-12-16 LAB — CK: Total CK: 188 U/L (ref 20–243)

## 2023-12-16 LAB — SJOGRENS SYNDROME-A EXTRACTABLE NUCLEAR ANTIBODY: SSA (Ro) (ENA) Antibody, IgG: <1 AI

## 2023-12-16 NOTE — Progress Notes (Signed)
 ANA is low titer positive, anti-CCP antibodies low titer positive.  All the labs are within normal limits.  I will discuss results at the follow-up visit.

## 2023-12-19 ENCOUNTER — Encounter: Payer: Self-pay | Admitting: Cardiovascular Disease

## 2023-12-24 NOTE — Addendum Note (Signed)
 Addended by: Edra Govern D on: 12/24/2023 12:23 PM   Modules accepted: Orders

## 2023-12-24 NOTE — Progress Notes (Signed)
 [facility] Summary Report / Loop Recorder

## 2024-01-06 ENCOUNTER — Ambulatory Visit: Admitting: Rheumatology

## 2024-01-07 DIAGNOSIS — M170 Bilateral primary osteoarthritis of knee: Secondary | ICD-10-CM | POA: Insufficient documentation

## 2024-01-07 DIAGNOSIS — M19041 Primary osteoarthritis, right hand: Secondary | ICD-10-CM | POA: Insufficient documentation

## 2024-01-07 HISTORY — DX: Primary osteoarthritis, right hand: M19.041

## 2024-01-07 HISTORY — DX: Bilateral primary osteoarthritis of knee: M17.0

## 2024-01-07 NOTE — Progress Notes (Signed)
 Office Visit Note  Patient: Emily Osborne             Date of Birth: March 03, 1961           MRN: [id number]             PCP: Estill Hemming, DO Referring: Estill Hemming, * Visit Date: 01/20/2024 Occupation: @GUAROCC @  Subjective:  Pain in multiple joints  History of Present Illness: Emily Osborne is a 63 y.o. female with polyarthralgia and myalgias.  She returns today after her initial evaluation on December 12, 2023.  She states she continues to have pain and discomfort in her both hands, both hips, knee joints and her lower back.  She continues to have pain in all her muscles.  She gives history of ongoing fatigue.  She states she has good days and bad days.  The pain is worse mostly towards the end of the day.  She denies any morning stiffness.  She denies any history of joint swelling.  There is no history of oral ulcers, nasal ulcers, sicca symptoms, malar rash, photosensitivity, or lymphadenopathy.    Activities of Daily Living:  Patient reports morning stiffness for 15-30 minutes intermittently.   Patient Reports nocturnal pain.  Difficulty dressing/grooming: Denies Difficulty climbing stairs: Denies Difficulty getting out of chair: Reports Difficulty using hands for taps, buttons, cutlery, and/or writing: Denies  Review of Systems  Constitutional:  Positive for fatigue.  HENT:  Negative for mouth sores and mouth dryness.   Eyes:  Negative for dryness.  Respiratory:  Negative for difficulty breathing.        On exertion   Cardiovascular:  Negative for chest pain and palpitations.  Gastrointestinal:  Positive for diarrhea. Negative for blood in stool and constipation.  Endocrine: Negative for increased urination.  Genitourinary:  Negative for involuntary urination.  Musculoskeletal:  Positive for joint pain, joint pain, myalgias, muscle weakness, morning stiffness, muscle tenderness and myalgias. Negative for gait problem and joint swelling.  Skin:  Positive  for rash. Negative for color change, hair loss and sensitivity to sunlight.  Allergic/Immunologic: Negative for susceptible to infections.  Neurological:  Positive for headaches. Negative for dizziness.  Hematological:  Negative for swollen glands.  Psychiatric/Behavioral:  Negative for depressed mood and sleep disturbance. The patient is not nervous/anxious.     PMFS History:  Patient Active Problem List   Diagnosis Date Noted   Primary osteoarthritis of both knees 01/07/2024   Primary osteoarthritis of both hands 01/07/2024   Acute non-recurrent pansinusitis 10/24/2023   Chronic pain of both shoulders 07/15/2023   Morbid obesity (HCC) 07/15/2023   Hyperlipidemia associated with type 2 diabetes mellitus (HCC) 12/18/2022   Skin rash 10/15/2022   Cerebrovascular accident (CVA) (HCC) 06/29/2022   Acute cerebrovascular accident (CVA) (HCC) 06/22/2022   Hyperglycemia 12/11/2021   Swelling 02/02/2021   Right lower quadrant abdominal pain 02/02/2021   History of COVID-19 10/17/2020   Shortness of breath 10/17/2020   COVID-19 10/12/2020   Dizziness 05/23/2020   Primary hypertension 06/11/2019   New daily persistent headache 06/11/2019   Memory loss 06/11/2019   Upper airway cough syndrome vs cough variant asthma 01/01/2019   Chest pain 12/09/2018   Prediabetes 10/07/2018   Preventative health care 08/07/2018   Anxiety 10/11/2017   S/P partial colectomy 06/18/2017   Multiple drug allergies 06/18/2017   Colitis 02/05/2017   Diarrhea 02/05/2017   Diverticular disease 01/23/2017   Helicobacter pylori (H. pylori) infection 12/13/2016  Routine screening for STI (sexually transmitted infection) 12/13/2016   Clostridium difficile colitis 12/13/2016   Diverticulitis 10/10/2016   Lower abdominal pain 09/29/2016   Palpitations 02/06/2016   Mild diastolic dysfunction 02/06/2016   Graves disease 01/02/2016   Acute diverticulitis 12/07/2015   Acute bacterial sinusitis 10/04/2015    History of colonic polyps 11/15/2014   Left shoulder pain 09/28/2013   Left-sided weakness 09/16/2013   Obesity (BMI 30-39.9) with low erv on pfts 09/25/19 09/02/2013   Myalgia 06/21/2011   Asymptomatic postmenopausal status 09/07/2010   PAIN IN JOINT, MULTIPLE SITES 12/27/2009   LOW BACK PAIN, CHRONIC 12/27/2009   FATIGUE 12/27/2009   DYSPEPSIA&OTHER SPEC DISORDERS FUNCTION STOMACH 09/01/2009   NAUSEA 08/03/2009   FLATULENCE-GAS-BLOATING 08/03/2009   ABDOMINAL PAIN RIGHT UPPER QUADRANT 07/06/2009   SUPRAPUBIC PAIN 07/06/2009   GANGLION CYST, HX OF 07/06/2009   BACK PAIN 05/04/2009   Hyperlipidemia 09/16/2008   UNSPECIFIED MYALGIA AND MYOSITIS 09/16/2008   Precordial pain 07/27/2008   GERD 06/08/2008   HELICOBACTER PYLORI GASTRITIS, HX OF 04/05/2008   OTHER ACUTE REACTIONS TO STRESS 03/16/2008   ABDOMINAL PAIN, EPIGASTRIC 03/16/2008   SHOULDER PAIN, RIGHT 10/01/2007   ELBOW PAIN, RIGHT 10/01/2007   Pain in limb 04/14/2007   DEPENDENT EDEMA, RIGHT LEG 04/14/2007   SHINGLES 04/08/2007   Asthma 03/18/2007   GANGLION CYST, WRIST, RIGHT 03/18/2007   BUNIONECTOMY, HX OF 03/18/2007    Past Medical History:  Diagnosis Date   Allergy    Anxiety    Asthma    pt has inhaler   Back pain    C. difficile colitis    Cataract    bil eyes   Chest pain    Chronic fatigue syndrome    Clostridium difficile colitis 12/13/2016   Diverticulitis    Dyspnea    Dysrhythmia    palpitations   Edema    Elevated blood pressure reading without diagnosis of hypertension    "just elevated when I'm in pain" (12/07/2015)   Fatty liver    Fibromyalgia    Ganglion of joint    right wrist   GERD (gastroesophageal reflux disease)    H. pylori infection    Headache    hx of   Helicobacter pylori (H. pylori) infection 12/13/2016   Herpes zoster without mention of complication    HTN (hypertension)    Hyperglycemia 08/07/2018   Hyperlipidemia    Hyperthyroidism    IBS (irritable bowel syndrome)     Leg edema    Multiple drug allergies 06/18/2017   Myalgia    Nontraumatic rupture of Achilles tendon    Other acute reactions to stress    Pain in joint, shoulder region    Pain in joint, upper arm    Pain in limb    Palpitations    Personal history of other diseases of digestive system    Pneumonia    once   PONV (postoperative nausea and vomiting)    Routine screening for STI (sexually transmitted infection) 12/13/2016   S/P partial colectomy 06/18/2017   Thyroid  disease    hyperthyroidism   Vitamin B 12 deficiency     Family History  Problem Relation Age of Onset   Hyperlipidemia Mother    Hypertension Mother        Died, 25   Thyroid  disease Mother        Thyroid  surgery   Obesity Mother    Heart disease Mother    Hypertension Father    Diabetes Father  Heart disease Father    Kidney disease Father        Died, 84   Hyperlipidemia Father    Pernicious anemia Sister    Thyroid  disease Sister        On thyroid  Rx   Atrial fibrillation Sister    Other Sister        hypoglycemia   Leukemia Brother    Cancer Brother        myleoblastic anemia   Diabetes Brother    Coronary artery disease Brother    Hypertension Brother    Aplastic anemia Daughter    Hypertension Daughter    Migraines Daughter    GER disease Daughter    Other Daughter        pericarditis   Bipolar disorder Daughter    Schizophrenia Daughter    Colon cancer Neg Hx    Esophageal cancer Neg Hx    Rectal cancer Neg Hx    Stomach cancer Neg Hx    Pancreatic cancer Neg Hx    Prostate cancer Neg Hx    Colon polyps Neg Hx    Past Surgical History:  Procedure Laterality Date   ABDOMINAL HYSTERECTOMY     ACHILLES TENDON REPAIR Right 2005   BUBBLE STUDY  07/09/2022   Procedure: BUBBLE STUDY;  Surgeon: Sheryle Donning, MD;  Location: [facility] ENDOSCOPY;  Service: Cardiovascular;;   BUNIONECTOMY Bilateral    Bunionectomy 1983   COLON SURGERY  01/23/2017   6 to 8 inches sigmoid colon  removed   COLONOSCOPY     GANGLION CYST EXCISION Right    wrist   LEFT HEART CATH AND CORONARY ANGIOGRAPHY N/A 10/03/2016   Procedure: Left Heart Cath and Coronary Angiography;  Surgeon: Odie Benne, MD;  Location: [facility] INVASIVE CV LAB;  Service: Cardiovascular;  Laterality: N/A;   LOOP RECORDER IMPLANT  07/2022   MENISCUS REPAIR Right 04/2014   POLYPECTOMY     TEE WITHOUT CARDIOVERSION N/A 07/09/2022   Procedure: TRANSESOPHAGEAL ECHOCARDIOGRAM (TEE);  Surgeon: Sheryle Donning, MD;  Location: [facility] ENDOSCOPY;  Service: Cardiovascular;  Laterality: N/A;   TUBAL LIGATION     wisdomteeth extraction     Social History   Social History Narrative   Lives with alone.  She has two daughter.   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   Right Handed   Caffeine 12 oz to 24 oz a day   Immunization History  Administered Date(s) Administered   Hepatitis A, Adult 06/20/2017   IPV 07/01/2017   Influenza Whole 05/05/2008   Meningococcal Mcv4o 06/20/2017   PFIZER(Purple Top)SARS-COV-2 Vaccination 09/01/2019, 09/22/2019, 05/23/2020   PPD Test 07/02/2011, 07/07/2012, 07/13/2013, 07/06/2014, 08/11/2015, 09/15/2016   Pfizer Covid-19 Vaccine Bivalent Booster 47yrs & up 10/19/2021   Pfizer(Comirnaty )Fall Seasonal Vaccine 12 years and older 10/15/2022   Pneumococcal Polysaccharide-23 09/21/2011   Td 09/27/1999, 09/07/2010   Tdap 07/01/2017   Typhoid Inactivated 06/17/2017   Zoster Recombinant(Shingrix) 06/10/2017, 12/16/2017   Zoster, Live 06/20/2012     Objective: Vital Signs: BP (!) 155/90 (BP Location: Left Arm, Patient Position: Sitting, Cuff Size: Large)   Pulse (!) 57   Resp 14   Ht 5\' 5"  (1.651 m)   Wt 251 lb 9.6 oz (114.1 kg)   BMI 41.87 kg/m    Physical Exam Vitals and nursing note reviewed.  Constitutional:      Appearance: She is well-developed.  HENT:     Head: Normocephalic and atraumatic.  Eyes:  Conjunctiva/sclera: Conjunctivae normal.   Cardiovascular:     Rate and Rhythm: Normal rate and regular rhythm.     Heart sounds: Normal heart sounds.  Pulmonary:     Effort: Pulmonary effort is normal.     Breath sounds: Normal breath sounds.  Abdominal:     General: Bowel sounds are normal.     Palpations: Abdomen is soft.  Musculoskeletal:     Cervical back: Normal range of motion.  Lymphadenopathy:     Cervical: No cervical adenopathy.  Skin:    General: Skin is warm and dry.     Capillary Refill: Capillary refill takes less than 2 seconds.  Neurological:     Mental Status: She is alert and oriented to person, place, and time.  Psychiatric:        Behavior: Behavior normal.      Musculoskeletal Exam: Cervical spine was in good range of motion.  There was no tenderness over thoracic or lumbar spine.  Shoulders, elbows, wrist joints with good range of motion.  She had bilateral PIP and DIP thickening and bilateral CMC thickening without any synovitis.  Hip joints were in good range of motion.  She had some tenderness over the trochanteric region.  Knee joints in good range of motion without any warmth swelling or effusion.  She has surgical scar from bilateral bunionectomies.  No synovitis was noted.  CDAI Exam: CDAI Score: -- Patient Global: --; Provider Global: -- Swollen: --; Tender: -- Joint Exam 01/20/2024   No joint exam has been documented for this visit   There is currently no information documented on the homunculus. Go to the Rheumatology activity and complete the homunculus joint exam.  Investigation: No additional findings.  Imaging: CUP PACEART REMOTE DEVICE CHECK Result Date: 01/15/2024 ILR summary report received. Battery status OK. Normal device function. No new symptom, tachy, brady, or pause episodes. No new AF episodes. Monthly summary reports and ROV/PRN LA, CVRS   Recent Labs: Lab Results  Component Value Date   WBC 7.4 12/12/2023   HGB 13.2 12/12/2023   PLT 241 12/12/2023   NA 141  12/12/2023   K 4.2 12/12/2023   CL 107 12/12/2023   CO2 26 12/12/2023   GLUCOSE 83 12/12/2023   BUN 12 12/12/2023   CREATININE 0.86 12/12/2023   BILITOT 0.5 12/12/2023   ALKPHOS 87 04/29/2023   AST 19 12/12/2023   ALT 20 12/12/2023   PROT 7.0 12/12/2023   PROT 7.0 12/12/2023   ALBUMIN 4.5 04/29/2023   CALCIUM  9.5 12/12/2023   GFRAA >60 11/27/2018   QFTBGOLDPLUS NEGATIVE 01/23/2022   December 12, 2023 SPEP normal, urine protein creatinine ratio normal, ANA 1: 80 NS, 1: 160 NH, ENA (SCL 70, RNP, Smith, SSA, SSB, dsDNA) negative, C3-C4 normal, anticardiolipin negative, beta-2  GP 1 negative, anti-CCP 28, vitamin D  80, CK188,  Speciality Comments: No specialty comments available.  Procedures:  No procedures performed Allergies: Aspirin, Gentamicin, Ibuprofen, Metronidazole, Nsaids, Augmentin  [amoxicillin -pot clavulanate], Doxycycline , Influenza a (h1n1) monoval vac, Loratadine , Metformin  and related, Periactin [cyproheptadine], Ranitidine hcl, Sulfamethoxazole-trimethoprim, and Tramadol hcl   Assessment / Plan:     Visit Diagnoses: Positive ANA (antinuclear antibody) - ANA low titer positive, ENA negative, C3-C4 normal, beta-2  negative, anticardiolipin negative.H/o arthralgias, myalgias, fatigue, mild dry eye symptoms off-and-on.  We had a detailed discussion regarding positive ANA and its prevalence in the normal population.  ENA panel was completely negative which was discussed.  She denies any history of oral ulcers, nasal ulcers, malar rash, photosensitivity,  Raynaud's, lymphadenopathy or inflammatory arthritis.  Advised her to contact me if she develops any new symptoms.  Polyarthralgia - History of polyarthralgia for many years.  Diagnosed with fibromyalgia 20 years ago.  Chronic pain of both shoulders - Patient was evaluated by Dr. Rozelle Corning in the past.  Previous x-rays were unremarkable.  Primary osteoarthritis of both hands - Clinical findings were suggestive of osteoarthritis. Her  x-rays obtained at the last visit were suggestive of osteoarthritis.  X-ray findings were reviewed with the patient.  Anti-CCP 28 weak positive.  No synovitis was noted on the examination.  I advised her to contact us  if she develops any swelling.  A handout on hand exercises was given.  Trochanteric bursitis, left hip-he handout on IT band stretches was given.  I also discussed possible injection in the future if her symptoms get worse.  Primary osteoarthritis of both knees - Previous x-rays showed moderate osteoarthritis and chondromalacia patella.  Lower extremity muscle strengthening exercises were given.  I also discussed the option of physical therapy which she declined.  Weight loss diet and exercise was emphasized.  Fibromyalgia-CK was normal.  Her clinical features are suggestive of fibromyalgia.  Need for regular exercise and stretching was discussed.  Benefits of water aerobics and swimming were discussed.  I offered referral to integrative therapies but she would like to hold off for now.  Other fatigue-most likely related to fibromyalgia syndrome.  Vitamin D  deficiency-she takes vitamin D  50,000 units once a week and 2000 units daily.  Advised her to discontinue vitamin D  2000 units daily.  Other medical problems are listed as follows:  Hyperlipidemia associated with type 2 diabetes mellitus (HCC)  History of CVA (cerebrovascular accident)  Primary hypertension-blood pressure was elevated at 146/95.  Repeat blood pressure was 155/90.  She was advised to monitor blood pressure closely and follow-up with her PCP.  Mild diastolic dysfunction  Prediabetes  History of diverticulitis  S/P partial colectomy  Clostridium difficile colitis  Helicobacter pylori (H. pylori) infection  Mild intermittent asthma without complication  Memory loss  Anxiety  Morbid obesity (HCC)  Orders: No orders of the defined types were placed in this encounter.  No orders of the defined  types were placed in this encounter.    Follow-Up Instructions: Return in about 1 year (around 01/19/2025) for Osteoarthritis.   Nicholas Bari, MD  Note - This record has been created using Animal nutritionist.  Chart creation errors have been sought, but may not always  have been located. Such creation errors do not reflect on  the standard of medical care.

## 2024-01-08 ENCOUNTER — Other Ambulatory Visit (HOSPITAL_COMMUNITY): Payer: Self-pay

## 2024-01-08 ENCOUNTER — Other Ambulatory Visit: Payer: Self-pay | Admitting: Family Medicine

## 2024-01-08 DIAGNOSIS — J302 Other seasonal allergic rhinitis: Secondary | ICD-10-CM

## 2024-01-08 MED ORDER — LEVOCETIRIZINE DIHYDROCHLORIDE 5 MG PO TABS
5.0000 mg | ORAL_TABLET | Freq: Every evening | ORAL | 1 refills | Status: DC
  Filled 2024-01-08: qty 90, 90d supply, fill #0
  Filled 2024-04-20: qty 90, 90d supply, fill #1

## 2024-01-14 ENCOUNTER — Ambulatory Visit: Payer: No Typology Code available for payment source

## 2024-01-14 ENCOUNTER — Ambulatory Visit (HOSPITAL_BASED_OUTPATIENT_CLINIC_OR_DEPARTMENT_OTHER): Admitting: Cardiology

## 2024-01-14 DIAGNOSIS — I639 Cerebral infarction, unspecified: Secondary | ICD-10-CM

## 2024-01-15 LAB — CUP PACEART REMOTE DEVICE CHECK
Date Time Interrogation Session: 20250526232739
Implantable Pulse Generator Implant Date: 20231219

## 2024-01-20 ENCOUNTER — Ambulatory Visit: Attending: Rheumatology | Admitting: Rheumatology

## 2024-01-20 ENCOUNTER — Encounter: Payer: Self-pay | Admitting: Rheumatology

## 2024-01-20 VITALS — BP 155/90 | HR 57 | Resp 14 | Ht 65.0 in | Wt 251.6 lb

## 2024-01-20 DIAGNOSIS — M19041 Primary osteoarthritis, right hand: Secondary | ICD-10-CM

## 2024-01-20 DIAGNOSIS — M797 Fibromyalgia: Secondary | ICD-10-CM

## 2024-01-20 DIAGNOSIS — M2550 Pain in unspecified joint: Secondary | ICD-10-CM | POA: Diagnosis not present

## 2024-01-20 DIAGNOSIS — I519 Heart disease, unspecified: Secondary | ICD-10-CM

## 2024-01-20 DIAGNOSIS — E785 Hyperlipidemia, unspecified: Secondary | ICD-10-CM

## 2024-01-20 DIAGNOSIS — M19042 Primary osteoarthritis, left hand: Secondary | ICD-10-CM

## 2024-01-20 DIAGNOSIS — G8929 Other chronic pain: Secondary | ICD-10-CM

## 2024-01-20 DIAGNOSIS — R768 Other specified abnormal immunological findings in serum: Secondary | ICD-10-CM | POA: Diagnosis not present

## 2024-01-20 DIAGNOSIS — Z9049 Acquired absence of other specified parts of digestive tract: Secondary | ICD-10-CM

## 2024-01-20 DIAGNOSIS — M25512 Pain in left shoulder: Secondary | ICD-10-CM

## 2024-01-20 DIAGNOSIS — A048 Other specified bacterial intestinal infections: Secondary | ICD-10-CM

## 2024-01-20 DIAGNOSIS — Z8719 Personal history of other diseases of the digestive system: Secondary | ICD-10-CM

## 2024-01-20 DIAGNOSIS — I10 Essential (primary) hypertension: Secondary | ICD-10-CM

## 2024-01-20 DIAGNOSIS — E1169 Type 2 diabetes mellitus with other specified complication: Secondary | ICD-10-CM

## 2024-01-20 DIAGNOSIS — R413 Other amnesia: Secondary | ICD-10-CM

## 2024-01-20 DIAGNOSIS — M7062 Trochanteric bursitis, left hip: Secondary | ICD-10-CM

## 2024-01-20 DIAGNOSIS — R7689 Other specified abnormal immunological findings in serum: Secondary | ICD-10-CM

## 2024-01-20 DIAGNOSIS — M25511 Pain in right shoulder: Secondary | ICD-10-CM

## 2024-01-20 DIAGNOSIS — J4520 Mild intermittent asthma, uncomplicated: Secondary | ICD-10-CM

## 2024-01-20 DIAGNOSIS — Z8673 Personal history of transient ischemic attack (TIA), and cerebral infarction without residual deficits: Secondary | ICD-10-CM

## 2024-01-20 DIAGNOSIS — F419 Anxiety disorder, unspecified: Secondary | ICD-10-CM

## 2024-01-20 DIAGNOSIS — R5383 Other fatigue: Secondary | ICD-10-CM

## 2024-01-20 DIAGNOSIS — A0472 Enterocolitis due to Clostridium difficile, not specified as recurrent: Secondary | ICD-10-CM

## 2024-01-20 DIAGNOSIS — M170 Bilateral primary osteoarthritis of knee: Secondary | ICD-10-CM

## 2024-01-20 DIAGNOSIS — E559 Vitamin D deficiency, unspecified: Secondary | ICD-10-CM

## 2024-01-20 DIAGNOSIS — R7303 Prediabetes: Secondary | ICD-10-CM

## 2024-01-20 NOTE — Patient Instructions (Addendum)
 Iliotibial Band Syndrome Rehab Ask your health care provider which exercises are safe for you. Do exercises exactly as told by your provider and adjust them as told. It's normal to feel mild stretching, pulling, tightness, or discomfort as you do these exercises. Stop right away if you feel sudden pain or your pain gets a lot worse. Do not begin these exercises until told by your provider. Stretching and range-of-motion exercises These exercises warm up your muscles and joints. They also improve the movement and flexibility of your hip and pelvis. Quadriceps stretch, prone  Lie face down (prone) on a firm surface like a bed or padded floor. Bend your left / right knee. Reach back to hold your ankle or pant leg. If you can't reach your ankle or pant leg, use a belt looped around your foot and grab the belt instead. Gently pull your heel toward your butt. Your knee should not slide out to the side. You should feel a stretch in the front of your thigh and knee, also called the quadriceps. Hold this position for __________ seconds. Repeat __________ times. Complete this exercise __________ times a day. Iliotibial band stretch The iliotibial band is a strip of tissue that runs along the outside of your hip down to your knee. Lie on your side with your left / right leg on top. Bend both knees and grab your left / right ankle. Stretch out your bottom arm to help you balance. Slowly bring your top knee back so your thigh goes behind your back. Slowly lower your top leg toward the floor until you feel a gentle stretch on the outside of your left / right hip and thigh. If you don't feel a stretch and your knee won't go farther, place the heel of your other foot on top of your knee and pull your knee down toward the floor with your foot. Hold this position for __________ seconds. Repeat __________ times. Complete this exercise __________ times a day. Strengthening exercises These exercises build strength  and endurance in your hip and pelvis. Endurance means your muscles can keep working even when they're tired. Straight leg raises, side-lying This exercise strengthens the muscles that rotate the leg at the hip and move it away from your body. These muscles are called hip abductors. Lie on your side with your left / right leg on top. Lie so your head, shoulder, hip, and knee line up. You can bend your bottom knee to help you balance. Roll your hips slightly forward so they're stacked directly over each other. Your left / right knee should face forward. Tense the muscles in your outer thigh and hip. Lift your top leg 4-6 inches (10-15 cm) off the ground. Hold this position for __________ seconds. Slowly lower your leg back down to the starting position. Let your muscles fully relax before doing this exercise again. Repeat __________ times. Complete this exercise __________ times a day. Leg raises, prone This exercise strengthens the muscles that move the hips backward. These muscles are called hip extensors. Lie face down (prone) on your bed or a firm surface. You can put a pillow under your hips for comfort and to support your lower back. Bend your left / right knee so your foot points straight up toward the ceiling. Keep the other leg straight and behind you. Squeeze your butt muscles. Lift your left / right thigh off the firm surface. Do not let your back arch. Tense your thigh muscle as hard as you can without having  more knee pain. Hold this position for __________ seconds. Slowly lower your leg to the starting position. Allow your leg to relax all the way. Repeat __________ times. Complete this exercise __________ times a day. Hip hike  Stand sideways on a bottom step. Place your feet so that your left / right leg is on the step, and the other foot is hanging off the side. If you need support for balance, hold onto a railing or wall. Keep your knees straight and your abdomen square,  meaning your hips are level. Then, lift your left / right hip up toward the ceiling. Slowly let your leg that's hanging off the step lower towards the floor. Your foot should get closer to the ground. Do not lean or bend your knees during this movement. Repeat __________ times. Complete this exercise __________ times a day. This information is not intended to replace advice given to you by your health care provider. Make sure you discuss any questions you have with your health care provider. Document Revised: 10/19/2022 Document Reviewed: 10/19/2022 Elsevier Patient Education  2024 [employer] Exercises for Chronic Knee Pain Chronic knee pain is pain that lasts longer than 3 months. For most people with chronic knee pain, exercise and weight loss is an important part of treatment. Your health care provider may want you to focus on: Making the muscles that support your knee stronger. This can take pressure off your knee and reduce pain. Preventing knee stiffness. How far you can move your knee, keeping it there or making it farther. Losing weight (if this applies) to take pressure off your knee, lower your risk for injury, and make it easier for you to exercise. Your provider will help you make an exercise program that fits your needs and physical abilities. Below are simple, low-impact exercises you can do at home. Ask your provider or physical therapist how often you should do your exercise program and how many times to repeat each exercise. General safety tips  Get your provider's approval before doing any exercises. Start slowly and stop any time you feel pain. Do not exercise if your knee pain is flaring up. Warm up first. Stretching a cold muscle can cause an injury. Do 5-10 minutes of easy movement or light stretching before beginning your exercises. Do 5-10 minutes of low-impact activity (like walking or cycling) before starting strengthening exercises. Contact your provider any time  you have pain during or after exercising. Exercise can cause discomfort but should not be painful. It is normal to be a little stiff or sore after exercising. Stretching and range-of-motion exercises Front thigh stretch  Stand up straight and support your body by holding on to a chair or resting one hand on a wall. With your legs straight and close together, bend one knee to lift your heel up toward your butt. Using one hand for support, grab your ankle with your free hand. Pull your foot up closer toward your butt to feel the stretch in front of your thigh. Hold the stretch for 30 seconds. Repeat __________ times. Complete this exercise __________ times a day. Back thigh stretch  Sit on the floor with your back straight and your legs out straight in front of you. Place the palms of your hands on the floor and slide them toward your feet as you bend at the hip. Try to touch your nose to your knees and feel the stretch in the back of your thighs. Hold for 30 seconds. Repeat __________ times. Complete this  exercise __________ times a day. Calf stretch  Stand facing a wall. Place the palms of your hands flat against the wall, arms extended, and lean slightly against the wall. Get into a lunge position with one leg bent at the knee and the other leg stretched out straight behind you. Keep both feet facing the wall and increase the bend in your knee while keeping the heel of the other leg flat on the ground. You should feel the stretch in your calf. Hold for 30 seconds. Repeat __________ times. Complete this exercise __________ times a day. Strengthening exercises Straight leg lift  Lie on your back with one knee bent and the other leg out straight. Slowly lift the straight leg without bending the knee. Lift until your foot is about 12 inches (30 cm) off the floor. Hold for 3-5 seconds and slowly lower your leg. Repeat __________ times. Complete this exercise __________ times a  day. Single leg dip  Stand between two chairs and put both hands on the backs of the chairs for support. Extend one leg out straight with your body weight resting on the heel of the standing leg. Slowly bend your standing knee to dip your body to the level that is comfortable for you. Hold for 3-5 seconds. Repeat __________ times. Complete this exercise __________ times a day. Hamstring curls  Stand straight, knees close together, facing the back of a chair. Hold on to the back of a chair with both hands. Keep one leg straight. Bend the other knee while bringing the heel up toward the butt until the knee is bent at a 90-degree angle (right angle). Hold for 3-5 seconds. Repeat __________ times. Complete this exercise __________ times a day. Wall squat  Stand straight with your back, hips, and head against a wall. Step forward one foot at a time with your back still against the wall. Your feet should be 2 feet (61 cm) from the wall at shoulder width. Keeping your back, hips, and head against the wall, slide down the wall to as close to a sitting position as you can get. Hold for 5-10 seconds, then slowly slide back up. Repeat __________ times. Complete this exercise __________ times a day. Step-ups  Stand in front of a sturdy platform or stool that is about 6 inches (15 cm) high. Slowly step up with your left / right foot, keeping your knee in line with your hip and foot. Do not let your knee bend so far that you cannot see your toes. Hold on to a chair for balance, but do not use it for support. Slowly unlock your knee and lower yourself to the starting position. Repeat __________ times. Complete this exercise __________ times a day. Contact a health care provider if: Your exercises cause pain. Your pain is worse after you exercise. Your pain prevents you from doing your exercises. This information is not intended to replace advice given to you by your health care provider. Make sure  you discuss any questions you have with your health care provider. Document Revised: 08/21/2022 Document Reviewed: 08/21/2022 Elsevier Patient Education  2024 [employer] Osteoarthritis  Osteoarthritis is a type of arthritis. It refers to joint pain or joint disease. Osteoarthritis affects tissue that covers the ends of bones in joints (cartilage). Cartilage acts as a cushion between the bones and helps them move smoothly. Osteoarthritis occurs when cartilage in the joints gets worn down. Osteoarthritis is sometimes called "wear and tear" arthritis. Osteoarthritis is the most common form of  arthritis. It often occurs in older people. It is a condition that gets worse over time. The joints most often affected by this condition are in the fingers, toes, hips, knees, and spine, including the neck and lower back. What are the causes? This condition is caused by the wearing down of cartilage that covers the ends of bones. What increases the risk? The following factors may make you more likely to develop this condition: Being age 59 or older. Obesity. Overuse of joints. Past injury of a joint. Past surgery on a joint. Family history of osteoarthritis. What are the signs or symptoms? The main symptoms of this condition are pain, swelling, and stiffness in the joint. Other symptoms may include: An enlarged joint. More pain and further damage caused by small pieces of bone or cartilage that break off and float inside of the joint. Small deposits of bone (osteophytes) that grow on the edges of the joint. A grating or scraping feeling inside the joint when you move it. Popping or creaking sounds when you move. Difficulty walking or exercising. An inability to grip items, twist your hand, or control the movements of your hands and fingers. How is this diagnosed? This condition may be diagnosed based on: Your medical history. A physical exam. Your symptoms. X-rays of the affected joints. Blood  tests to rule out other types of arthritis. How is this treated? There is no cure for this condition, but treatment can help control pain and improve joint function. Treatment may include a combination of therapies, such as: Pain relief techniques, such as: Applying heat and cold to the joint. Massage. A form of talk therapy called cognitive behavioral therapy (CBT). This therapy helps you set goals and follow up on the changes that you make. Medicines for pain and inflammation. The medicines can be taken by mouth or applied to the skin. They include: NSAIDs, such as ibuprofen. Prescription medicines. Strong anti-inflammatory medicines (corticosteroids). Certain nutritional supplements. A prescribed exercise program. You may work with a physical therapist. Assistive devices, such as a brace, wrap, splint, specialized glove, or cane. A weight control plan. Surgery, such as: An osteotomy. This is done to reposition the bones and relieve pain or to remove loose pieces of bone and cartilage. Joint replacement surgery. You may need this surgery if you have advanced osteoarthritis. Follow these instructions at home: Activity Rest your affected joints as told by your health care provider. Exercise as told by your provider. The provider may recommend specific types of exercise, such as: Strengthening exercises. These are done to strengthen the muscles that support joints affected by arthritis. Aerobic activities. These are exercises, such as brisk walking or water aerobics, that increase your heart rate. Range-of-motion activities. These help your joints move more easily. Balance and agility exercises. Managing pain, stiffness, and swelling     If told, apply heat to the affected area as often as told by your provider. Use the heat source that your provider recommends, such as a moist heat pack or a heating pad. If you have a removable assistive device, remove it as told by your  provider. Place a towel between your skin and the heat source. If your provider tells you to keep the assistive device on while you apply heat, place a towel between the assistive device and the heat source. Leave the heat on for 20-30 minutes. If told, put ice on the affected area. If you have a removable assistive device, remove it as told by your provider. Put ice  in a plastic bag. Place a towel between your skin and the bag. If your provider tells you to keep the assistive device on during icing, place a towel between the assistive device and the bag. Leave the ice on for 20 minutes, 2-3 times a day. If your skin turns bright red, remove the ice or heat right away to prevent skin damage. The risk of damage is higher if you cannot feel pain, heat, or cold. Move your fingers or toes often to reduce stiffness and swelling. Raise (elevate) the affected area above the level of your heart while you are sitting or lying down. General instructions Take over-the-counter and prescription medicines only as told by your provider. Maintain a healthy weight. Follow instructions from your provider for weight control. Do not use any products that contain nicotine or tobacco. These products include cigarettes, chewing tobacco, and vaping devices, such as e-cigarettes. If you need help quitting, ask your provider. Use assistive devices as told by your provider. Where to find more information [employer] of Arthritis and Musculoskeletal and Skin Diseases: niams.http://www.myers.net/ [employer] on Aging: BaseRingTones.pl American College of Rheumatology: rheumatology.org Contact a health care provider if: You have redness, swelling, or a feeling of warmth in a joint that gets worse. You have a fever along with joint or muscle aches. You develop a rash. You have trouble doing your normal activities. You have pain that gets worse and is not relieved by pain medicine. This information is not intended to replace  advice given to you by your health care provider. Make sure you discuss any questions you have with your health care provider. Document Revised: 04/05/2022 Document Reviewed: 04/05/2022 Elsevier Patient Education  2024 [employer].

## 2024-01-22 ENCOUNTER — Ambulatory Visit: Payer: Self-pay | Admitting: Cardiovascular Disease

## 2024-01-29 NOTE — Progress Notes (Signed)
 [facility] Summary Report / Loop Recorder

## 2024-02-01 ENCOUNTER — Other Ambulatory Visit (HOSPITAL_COMMUNITY): Payer: Self-pay

## 2024-02-01 ENCOUNTER — Other Ambulatory Visit: Payer: Self-pay | Admitting: Nurse Practitioner

## 2024-02-03 ENCOUNTER — Other Ambulatory Visit (HOSPITAL_COMMUNITY): Payer: Self-pay

## 2024-02-03 MED ORDER — PANTOPRAZOLE SODIUM 40 MG PO TBEC
DELAYED_RELEASE_TABLET | ORAL | 1 refills | Status: DC
Start: 2024-02-03 — End: 2024-03-13
  Filled 2024-02-03: qty 60, 30d supply, fill #0
  Filled 2024-03-02: qty 60, 30d supply, fill #1

## 2024-02-04 ENCOUNTER — Other Ambulatory Visit: Payer: Self-pay

## 2024-02-04 ENCOUNTER — Emergency Department (HOSPITAL_COMMUNITY)

## 2024-02-04 ENCOUNTER — Emergency Department (HOSPITAL_COMMUNITY)
Admission: EM | Admit: 2024-02-04 | Discharge: 2024-02-05 | Discharge status: Against Medical Advice | Attending: Emergency Medicine | Admitting: Emergency Medicine

## 2024-02-04 ENCOUNTER — Encounter (HOSPITAL_COMMUNITY): Payer: Self-pay

## 2024-02-04 DIAGNOSIS — R110 Nausea: Secondary | ICD-10-CM | POA: Diagnosis not present

## 2024-02-04 DIAGNOSIS — R0602 Shortness of breath: Secondary | ICD-10-CM | POA: Insufficient documentation

## 2024-02-04 DIAGNOSIS — R0789 Other chest pain: Secondary | ICD-10-CM | POA: Insufficient documentation

## 2024-02-04 DIAGNOSIS — Z5321 Procedure and treatment not carried out due to patient leaving prior to being seen by health care provider: Secondary | ICD-10-CM | POA: Insufficient documentation

## 2024-02-04 LAB — COMPREHENSIVE METABOLIC PANEL WITH GFR
ALT: 21 U/L (ref 0–44)
AST: 23 U/L (ref 15–41)
Albumin: 3.9 g/dL (ref 3.5–5.0)
Alkaline Phosphatase: 62 U/L (ref 38–126)
Anion gap: 12 (ref 5–15)
BUN: 13 mg/dL (ref 8–23)
CO2: 21 mmol/L — ABNORMAL LOW (ref 22–32)
Calcium: 9.3 mg/dL (ref 8.9–10.3)
Chloride: 105 mmol/L (ref 98–111)
Creatinine, Ser: 0.93 mg/dL (ref 0.44–1.00)
GFR, Estimated: >60 mL/min (ref >60)
Glucose, Bld: 85 mg/dL (ref 70–99)
Potassium: 4.1 mmol/L (ref 3.5–5.1)
Sodium: 138 mmol/L (ref 135–145)
Total Bilirubin: 0.8 mg/dL (ref 0.0–1.2)
Total Protein: 7.1 g/dL (ref 6.5–8.1)

## 2024-02-04 LAB — CBC WITH DIFFERENTIAL/PLATELET
Abs Immature Granulocytes: 0.01 10*3/uL (ref 0.00–0.07)
Basophils Absolute: 0 10*3/uL (ref 0.0–0.1)
Basophils Relative: 1 %
Eosinophils Absolute: 0.1 10*3/uL (ref 0.0–0.5)
Eosinophils Relative: 2 %
HCT: 41 % (ref 36.0–46.0)
Hemoglobin: 13.4 g/dL (ref 12.0–15.0)
Immature Granulocytes: 0 %
Lymphocytes Relative: 49 %
Lymphs Abs: 3.1 10*3/uL (ref 0.7–4.0)
MCH: 28.9 pg (ref 26.0–34.0)
MCHC: 32.7 g/dL (ref 30.0–36.0)
MCV: 88.6 fL (ref 80.0–100.0)
Monocytes Absolute: 0.6 10*3/uL (ref 0.1–1.0)
Monocytes Relative: 10 %
Neutro Abs: 2.4 10*3/uL (ref 1.7–7.7)
Neutrophils Relative %: 38 %
Platelets: 243 10*3/uL (ref 150–400)
RBC: 4.63 MIL/uL (ref 3.87–5.11)
RDW: 14.7 % (ref 11.5–15.5)
WBC: 6.2 10*3/uL (ref 4.0–10.5)
nRBC: 0 % (ref 0.0–0.2)

## 2024-02-04 LAB — D-DIMER, QUANTITATIVE: D-Dimer, Quant: <0.27 ug{FEU}/mL (ref 0.00–0.50)

## 2024-02-04 LAB — TROPONIN I (HIGH SENSITIVITY)
Troponin I (High Sensitivity): 2 ng/L (ref <18)
Troponin I (High Sensitivity): 3 ng/L (ref <18)

## 2024-02-04 MED ORDER — OXYCODONE-ACETAMINOPHEN 5-325 MG PO TABS
1.0000 | ORAL_TABLET | Freq: Once | ORAL | Status: AC
  Administered 2024-02-04: 1 via ORAL
  Filled 2024-02-04: qty 1

## 2024-02-04 NOTE — ED Provider Triage Note (Signed)
 Emergency Medicine Provider Triage Evaluation Note  Emily Osborne , a 63 y.o. female  was evaluated in triage.  Pt complains of chest pain.  Review of Systems  Positive: Sudden onset pain Negative: Cough, vomiting  Physical Exam  BP (!) 141/94 (BP Location: Left Arm)   Pulse 70   Temp 98 F (36.7 C)   Resp 18   Ht 5' 5 (1.651 m)   Wt 111.6 kg   SpO2 100%   BMI 40.94 kg/m  Gen:   Awake, no distress   Resp:  Normal effort  MSK:   Moves extremities without difficulty  Other:    Medical Decision Making  Medically screening exam initiated at 7:20 PM.  Appropriate orders placed.  Emily Osborne was informed that the remainder of the evaluation will be completed by another provider, this initial triage assessment does not replace that evaluation, and the importance of remaining in the ED until their evaluation is complete.  Sudden onset sharp, severe pain in the lower left chest, under breast. No radiation to back, arm or jaw. She is not SOB, but feels the pain more when taking a breath. No vomiting. History of PFO, has loop recorder. Uncomfortable appearing.   Mandy Second, PA-C 02/04/24 1922

## 2024-02-04 NOTE — ED Triage Notes (Signed)
 Pt reports sharp left sided chest pain under her left breast, onset today around 1845, associated with shortness of breath and nausea. She is clutching her chest.

## 2024-02-05 ENCOUNTER — Telehealth: Payer: Self-pay | Admitting: Cardiology

## 2024-02-05 NOTE — Telephone Encounter (Signed)
 Pt requesting a c/b from the nurse in regards to her results. Please advise

## 2024-02-05 NOTE — Telephone Encounter (Signed)
 Called and spoke to pt. Full name and DOB verified.  Pt states she went to ED last night for CP. Labs were drawn and but no one went to check on her when her second troponin level was drawn. She spoke to staff / front desk to see if someone could check on her lab results since she felt better around 2 am and wanted to speak to a provider. By 4540, no one came to see her and left AMA. States she feels OK today, she only woke up with a headache and would like to know if she can see Dr. Veryl Gottron to discuss if she need any new medications.  Pt admits to having increased stressors, caffeine intake (Mt Dew and coffee) and only drinks 2-3 bottles of water/day. Advised pt to drink more water & less caffeine and manage her stress levels. Denies palpitations. Pt aware that Dr. Veryl Gottron is away until next month and does not have any opening soon until October time. In agreement to see APP in the meantime.   Discussed with Slater Duncan, NP - recommends seeing her PCP first and if cannot be seen sooner, OK to use TOC slot for an ED f/u.

## 2024-02-05 NOTE — Telephone Encounter (Signed)
 Called pt and discussed APP's recommendations.  Pt will f/u to see PCP. For now, pt is scheduled to see Swinyer, NP on 6/30 @ 2:20. Advised pt to let us  know if she sees her PCP before so we can cancel ED f/u appt with Cardiology. Pt still to be seen by Dr. Veryl Gottron in Sept 2025. Pt verbalized understanding.

## 2024-02-05 NOTE — ED Notes (Signed)
 Patient stated she was feeling better and that she did not want to wait any longer and if she start feeling any worst that she will come back.

## 2024-02-10 ENCOUNTER — Other Ambulatory Visit (HOSPITAL_COMMUNITY): Payer: Self-pay

## 2024-02-12 ENCOUNTER — Encounter: Payer: Self-pay | Admitting: Family Medicine

## 2024-02-13 ENCOUNTER — Ambulatory Visit

## 2024-02-13 DIAGNOSIS — I639 Cerebral infarction, unspecified: Secondary | ICD-10-CM | POA: Diagnosis not present

## 2024-02-13 LAB — CUP PACEART REMOTE DEVICE CHECK
Date Time Interrogation Session: 20250625233353
Implantable Pulse Generator Implant Date: 20231219

## 2024-02-16 NOTE — Progress Notes (Unsigned)
 Cardiology Office Note   Date:  02/17/2024  ID:  [name], [name] 1961/04/21, MRN [id number] PCP: Antonio Meth, Jamee SAUNDERS, DO  [facility] Providers Cardiologist:  Shelda Bruckner, MD     PMH Stroke Hypothyroidism Asthma Type 2 diabetes Left heart cath 10/03/2016 Normal LV function LVEDP normal No angiographic evidence of CAD Osteoarthritis  Admission 06/22/2022 with sudden onset headache followed by numbness on left side of face. Acute CVA confirmed on imaging. Concern for embolic disease without clear etiology.   Seen by  Dr. Bruckner initially in 2020, she returned 07/04/2022 to reestablish care. 14-day ZIO revealed no VT, atrial fibrillation, high degree block or pauses.  Isolated atrial and ventricular ectopy was rare (less than 1%).  There were 8 triggered events, all of which were NSR.  She had a minimum HR of 49 bpm, max HR of 154 bpm, and average HR of 73 bpm.  TEE 07/09/2022 revealed normal LVEF 55 to 60%, normal RV, no left atrial/left atrial appendage thrombus, mild MR, small patent foramen ovale with predominantly right-to-left shunting observed within 3-6 cardiac cycles suggestive of interatrial shunt.  PFO not recommended for closure.  She underwent ILR implant 08/07/2022. No record of a fib.   She contacted our office 02/05/2024 to report chest pain for which she went to ED but left AMA.  She noted increased stressors including increased caffeine intake with [facility] and coffee. Hs troponin 2>3 in ED.   History of Present Illness Discussed the use of AI scribe software for clinical note transcription with the patient, who gave verbal consent to proceed.  History of Present Illness Emily Osborne is a very pleasant 63 year old female who is here today for follow-up of chest pain. On February 05, 2024, she experienced severe, stabbing, cramping chest pain under her breast, accompanied by shortness of breath and nausea, lasting for several hours.   Discomfort was not relieved with Pepcid .  At the time pain started, she was getting in the car but did not directly injure herself.  She went to ED where she had negative troponin x 2 and ultimately left AMA due to discomfort sitting up in a wheelchair for several hours. She received Percocet at the hospital, which made her sleepy and the pain eventually subsided.  She has had no return of chest pain since that time.  She has been on Protonix  and Pepcid  for several years which has relieved midsternal chest pain that she previously thought was cardiac in nature. She underwent cardiac catheterization in 2018 that did not reveal any CAD. 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  She remains active at work and with her family but does not exercise on a consistent basis.  She admits she needs to work on [employer] with a particular fondness for fried chicken.  She reports BP was elevated in the ED but she does not have a history of hypertension.  The only time her BP is elevated is when she is in pain.  ROS: See HPI  Studies Reviewed     EKG completed 02/05/24  in ED personally reviewed by me and revealed NSR at 68 bpm, no acute ST abnormality  No results found for: LIPOA  Risk Assessment/Calculations           Physical Exam VS:  BP 112/78 (BP Location: Left Arm, Patient Position: Sitting, Cuff Size: Large)   Pulse 85   Ht 5' 5 (1.651 m)   Wt 251  lb (113.9 kg)   SpO2 98%   BMI 41.77 kg/m    Wt Readings from Last 3 Encounters:  02/17/24 251 lb (113.9 kg)  02/04/24 246 lb (111.6 kg)  01/20/24 251 lb 9.6 oz (114.1 kg)    GEN: Obese, well developed in no acute distress NECK: No JVD; No carotid bruits CARDIAC: RRR, no murmurs, rubs, gallops RESPIRATORY:  Clear to auscultation without rales, wheezing or rhonchi  ABDOMEN: Soft, non-tender, non-distended EXTREMITIES:  No edema; No deformity   Assessment & Plan Chest pain   Recent episode of chest pain associated with   shortness of breath and nausea that lasted several hours. Pain located under left breast. Troponin and EKG in ED were normal. She has had no further episodes of chest pain. 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  History of cardiac cath 2018 with no significant CAD.  Due to additional risk factors of HLD, diabetes, and prior stroke, we will get coronary CTA to assess for coronary artery disease. We will get echocardiogram to evaluate heart and valve function. Will have her take  metoprolol  100 mg two hours  prior to CT. Brief discussion about LDL cholesterol not well controlled. She is due for repeat lipid tested with PCP and plans to do that soon.  Continue atorvastatin  for now.  Stroke/PFO History of stroke with no atrial fibrillation detected. S/p ILR implant 08/2022. No evidence of a fib. PFO noted on TEE 06/2022, felt to be low risk, no significant shunting. She is asymptomatic.  Continue Plavix , atorvastatin .  Hyperlipidemia LDL goal < 70 Lipid panel completed 04/29/2023 with total cholesterol 160, HDL 55, LDL 84, and triglycerides 894.  Advised LDL goal is less than 70.  Brief discussion regarding more aggressive lipid lowering therapy. She plans to have repeat testing with PCP soon. Consider adjunct therapy if lipids not at goal. Continue atorvastatin  for now.   Mitral valve regurgitation Mild mitral valve regurgitation on TEE 07/09/2022.  Given history of PFO and recent episode of chest pain, we will get echocardiogram to evaluate heart and valve function.        Dispo: Keep your September appointment with Dr. Lonni  Signed, Rosaline Bane, NP-C

## 2024-02-17 ENCOUNTER — Other Ambulatory Visit (HOSPITAL_COMMUNITY): Payer: Self-pay

## 2024-02-17 ENCOUNTER — Ambulatory Visit (HOSPITAL_BASED_OUTPATIENT_CLINIC_OR_DEPARTMENT_OTHER): Admitting: Nurse Practitioner

## 2024-02-17 ENCOUNTER — Encounter (HOSPITAL_BASED_OUTPATIENT_CLINIC_OR_DEPARTMENT_OTHER): Payer: Self-pay | Admitting: Nurse Practitioner

## 2024-02-17 VITALS — BP 112/78 | HR 85 | Ht 65.0 in | Wt 251.0 lb

## 2024-02-17 DIAGNOSIS — E785 Hyperlipidemia, unspecified: Secondary | ICD-10-CM | POA: Diagnosis not present

## 2024-02-17 DIAGNOSIS — I639 Cerebral infarction, unspecified: Secondary | ICD-10-CM | POA: Diagnosis not present

## 2024-02-17 DIAGNOSIS — R072 Precordial pain: Secondary | ICD-10-CM

## 2024-02-17 DIAGNOSIS — I340 Nonrheumatic mitral (valve) insufficiency: Secondary | ICD-10-CM

## 2024-02-17 DIAGNOSIS — Q2112 Patent foramen ovale: Secondary | ICD-10-CM

## 2024-02-17 MED ORDER — METOPROLOL TARTRATE 100 MG PO TABS
ORAL_TABLET | ORAL | 0 refills | Status: DC
  Filled 2024-02-17: qty 1, 1d supply, fill #0

## 2024-02-17 NOTE — Patient Instructions (Signed)
 Medication Instructions:   Your physician recommends that you continue on your current medications as directed. Please refer to the Current Medication list given to you today.   *If you need a refill on your cardiac medications before your next appointment, please call your pharmacy*  Lab Work:  None ordered.  If you have labs (blood work) drawn today and your tests are completely normal, you will receive your results only by: MyChart Message (if you have MyChart) OR A paper copy in the mail If you have any lab test that is abnormal or we need to change your treatment, we will call you to review the results.  Testing/Procedures:  Your physician has requested that you have an echocardiogram. Echocardiography is a painless test that uses sound waves to create images of your heart. It provides your doctor with information about the size and shape of your heart and how well your heart's chambers and valves are working. This procedure takes approximately one hour. There are no restrictions for this procedure. Please do NOT wear cologne, perfume, or lotions (deodorant is allowed). Please arrive 15 minutes prior to your appointment time.  Please note: We ask at that you not bring children with you during ultrasound (echo/ vascular) testing. Due to room size and safety concerns, children are not allowed in the ultrasound rooms during exams. Our front office staff cannot provide observation of children in our lobby area while testing is being conducted. An adult accompanying a patient to their appointment will only be allowed in the ultrasound room at the discretion of the ultrasound technician under special circumstances. We apologize for any inconvenience.    Your cardiac CT will be scheduled at one of the below locations:     [facility] [address] [place], [place] [address] [phone]     If scheduled at [facility], please arrive 30 minutes early for  check-in and test prep.  Please follow these instructions carefully (unless otherwise directed):  An IV will be required for this test and Nitroglycerin will be given.    On the Night Before the Test: Be sure to Drink plenty of water. Do not consume any caffeinated/decaffeinated beverages or chocolate 12 hours prior to your test. Do not take any antihistamines 12 hours prior to your test.   On the Day of the Test: Drink plenty of water until 1 hour prior to the test. Do not eat any food 1 hour prior to test. You may take your regular medications prior to the test.  Take metoprolol  (Lopressor ) one (1) tablet by mouth ( 100 mg) two hours prior to test. If you take Furosemide   please HOLD on the morning of the test. Patients who wear a continuous glucose monitor MUST remove the device prior to scanning. FEMALES- please wear underwire-free bra if available, avoid dresses & tight clothing     After the Test: Drink plenty of water. After receiving IV contrast, you may experience a mild flushed feeling. This is normal. On occasion, you may experience a mild rash up to 24 hours after the test. This is not dangerous. If this occurs, you can take Benadryl  25 mg, Zyrtec, Claritin , or Allegra  and increase your fluid intake. (Patients taking Tikosyn should avoid Benadryl , and may take Zyrtec, Claritin , or Allegra ) If you experience trouble breathing, this can be serious. If it is severe call 911 IMMEDIATELY. If it is mild, please call our office.  We will call to schedule your test 2-4 weeks  out understanding that some insurance companies will need an authorization prior to the service being performed.   For more information and frequently asked questions, please visit our website : [email or web address]  For non-scheduling related questions, please contact the cardiac imaging nurse navigator should you have any questions/concerns: Cardiac Imaging Nurse Navigators Direct Office Dial:  [phone]   For scheduling needs, including cancellations and rescheduling, please call [place], [phone].    Follow-Up: At [facility], you and your health needs are our priority.  As part of our continuing mission to provide you with exceptional heart care, our providers are all part of one team.  This team includes your primary Cardiologist (physician) and Advanced Practice Providers or APPs (Physician Assistants and Nurse Practitioners) who all work together to provide you with the care you need, when you need it.  Your next appointment:   3 month(s)  Provider:   Shelda Bruckner, MD    We recommend signing up for the patient portal called MyChart.  Sign up information is provided on this After Visit Summary.  MyChart is used to connect with patients for Virtual Visits (Telemedicine).  Patients are able to view lab/test results, encounter notes, upcoming appointments, etc.  Non-urgent messages can be sent to your provider as well.   To learn more about what you can do with MyChart, go to [email or web address].

## 2024-02-19 ENCOUNTER — Ambulatory Visit: Payer: Self-pay | Admitting: Cardiovascular Disease

## 2024-02-22 ENCOUNTER — Other Ambulatory Visit (HOSPITAL_COMMUNITY): Payer: Self-pay

## 2024-02-25 ENCOUNTER — Other Ambulatory Visit: Payer: Self-pay | Admitting: Family Medicine

## 2024-02-25 ENCOUNTER — Encounter: Payer: Self-pay | Admitting: Internal Medicine

## 2024-02-25 DIAGNOSIS — Z1231 Encounter for screening mammogram for malignant neoplasm of breast: Secondary | ICD-10-CM

## 2024-03-02 ENCOUNTER — Telehealth (HOSPITAL_COMMUNITY): Payer: Self-pay | Admitting: Pharmacy Technician

## 2024-03-02 ENCOUNTER — Other Ambulatory Visit: Payer: Self-pay

## 2024-03-02 ENCOUNTER — Other Ambulatory Visit (HOSPITAL_BASED_OUTPATIENT_CLINIC_OR_DEPARTMENT_OTHER): Payer: Self-pay | Admitting: Nurse Practitioner

## 2024-03-02 ENCOUNTER — Other Ambulatory Visit (HOSPITAL_COMMUNITY): Payer: Self-pay

## 2024-03-02 ENCOUNTER — Telehealth (HOSPITAL_COMMUNITY): Payer: Self-pay | Admitting: *Deleted

## 2024-03-02 ENCOUNTER — Other Ambulatory Visit: Payer: Self-pay | Admitting: Family Medicine

## 2024-03-02 ENCOUNTER — Other Ambulatory Visit: Payer: Self-pay | Admitting: Internal Medicine

## 2024-03-02 DIAGNOSIS — J302 Other seasonal allergic rhinitis: Secondary | ICD-10-CM

## 2024-03-02 LAB — HM DIABETES EYE EXAM

## 2024-03-02 MED ORDER — ATORVASTATIN CALCIUM 40 MG PO TABS
40.0000 mg | ORAL_TABLET | Freq: Every day | ORAL | 1 refills | Status: AC
  Filled 2024-03-02: qty 90, 90d supply, fill #0
  Filled 2024-03-13 – 2024-06-25 (×2): qty 90, 90d supply, fill #1

## 2024-03-02 MED ORDER — FLUTICASONE PROPIONATE 50 MCG/ACT NA SUSP
2.0000 | Freq: Every day | NASAL | 5 refills | Status: AC
  Filled 2024-03-02: qty 16, 30d supply, fill #0
  Filled 2024-03-13 – 2024-04-20 (×2): qty 16, 30d supply, fill #1
  Filled 2024-05-20: qty 16, 30d supply, fill #2

## 2024-03-02 NOTE — Telephone Encounter (Signed)

## 2024-03-03 ENCOUNTER — Ambulatory Visit (HOSPITAL_BASED_OUTPATIENT_CLINIC_OR_DEPARTMENT_OTHER)
Admission: RE | Admit: 2024-03-03 | Discharge: 2024-03-03 | Disposition: A | Discharge status: Home / Self Care | Source: Ambulatory Visit | Attending: Nurse Practitioner | Admitting: Nurse Practitioner

## 2024-03-03 ENCOUNTER — Encounter (HOSPITAL_BASED_OUTPATIENT_CLINIC_OR_DEPARTMENT_OTHER): Payer: Self-pay

## 2024-03-03 ENCOUNTER — Other Ambulatory Visit (HOSPITAL_COMMUNITY): Payer: Self-pay

## 2024-03-03 DIAGNOSIS — R072 Precordial pain: Secondary | ICD-10-CM | POA: Diagnosis present

## 2024-03-03 MED ORDER — NITROGLYCERIN 0.4 MG SL SUBL
0.8000 mg | SUBLINGUAL_TABLET | Freq: Once | SUBLINGUAL | Status: AC
  Administered 2024-03-03: 0.8 mg via SUBLINGUAL

## 2024-03-03 MED ORDER — IOHEXOL 350 MG/ML SOLN
100.0000 mL | Freq: Once | INTRAVENOUS | Status: AC | PRN
  Administered 2024-03-03: 110 mL via INTRAVENOUS

## 2024-03-03 NOTE — Progress Notes (Signed)
 Patient presents for a cardiac CT scan. At the end of the injection, patient reported pain. Upon assessment, an area of infiltration was noted. We asked the ED MD, Dr. Pamella, to come and assess the arm.  Dr. Pamella states that the patient's arm looked ok for discharge.  Instructions given to patient about care and when to seek medical attention. Patient verbalized understanding.

## 2024-03-04 ENCOUNTER — Ambulatory Visit: Payer: Self-pay | Admitting: Nurse Practitioner

## 2024-03-04 ENCOUNTER — Other Ambulatory Visit (HOSPITAL_COMMUNITY): Payer: Self-pay

## 2024-03-04 ENCOUNTER — Telehealth: Payer: Self-pay

## 2024-03-04 NOTE — Telephone Encounter (Signed)
 Pharmacy Patient Advocate Encounter   Received notification from Patient Pharmacy that prior authorization for PANTOPRAZOLE  SOD DR 40 MG TAB is required/requested.   Insurance verification completed.   The patient is insured through [facility] .   Per test claim: PA required; PA submitted to above mentioned insurance via Prompt PA Key/confirmation #/EOC [id number] Status is pending

## 2024-03-05 ENCOUNTER — Other Ambulatory Visit (HOSPITAL_COMMUNITY): Payer: Self-pay

## 2024-03-05 NOTE — Telephone Encounter (Signed)
 Pharmacy Patient Advocate Encounter  Received notification from [employer] that Prior Authorization for PANTOPRAZOLE  SOD DR 40 MG TAB has been APPROVED from 03-04-2024 to 03-03-2025   PA #/Case ID/Reference #: [id number]

## 2024-03-06 ENCOUNTER — Other Ambulatory Visit: Payer: Self-pay | Admitting: Internal Medicine

## 2024-03-06 ENCOUNTER — Other Ambulatory Visit (HOSPITAL_COMMUNITY): Payer: Self-pay

## 2024-03-06 NOTE — Addendum Note (Signed)
 Addended by: TAWNI DRILLING D on: 03/06/2024 12:44 PM   Modules accepted: Orders

## 2024-03-06 NOTE — Progress Notes (Signed)
 [facility] Summary Report / Loop Recorder

## 2024-03-13 ENCOUNTER — Other Ambulatory Visit: Payer: Self-pay | Admitting: Gastroenterology

## 2024-03-14 ENCOUNTER — Other Ambulatory Visit (HOSPITAL_COMMUNITY): Payer: Self-pay

## 2024-03-16 ENCOUNTER — Other Ambulatory Visit (HOSPITAL_COMMUNITY): Payer: Self-pay

## 2024-03-16 ENCOUNTER — Encounter: Payer: Self-pay | Admitting: Family Medicine

## 2024-03-16 ENCOUNTER — Other Ambulatory Visit: Payer: Self-pay

## 2024-03-16 ENCOUNTER — Ambulatory Visit (INDEPENDENT_AMBULATORY_CARE_PROVIDER_SITE_OTHER): Admitting: Family Medicine

## 2024-03-16 ENCOUNTER — Ambulatory Visit (INDEPENDENT_AMBULATORY_CARE_PROVIDER_SITE_OTHER)

## 2024-03-16 VITALS — BP 107/76 | HR 87 | Temp 99.3°F | Resp 16 | Ht 65.0 in | Wt 254.0 lb

## 2024-03-16 DIAGNOSIS — R739 Hyperglycemia, unspecified: Secondary | ICD-10-CM

## 2024-03-16 DIAGNOSIS — E785 Hyperlipidemia, unspecified: Secondary | ICD-10-CM

## 2024-03-16 DIAGNOSIS — R600 Localized edema: Secondary | ICD-10-CM | POA: Diagnosis not present

## 2024-03-16 DIAGNOSIS — I639 Cerebral infarction, unspecified: Secondary | ICD-10-CM | POA: Diagnosis not present

## 2024-03-16 DIAGNOSIS — Z0000 Encounter for general adult medical examination without abnormal findings: Secondary | ICD-10-CM | POA: Diagnosis not present

## 2024-03-16 DIAGNOSIS — Z23 Encounter for immunization: Secondary | ICD-10-CM

## 2024-03-16 DIAGNOSIS — I10 Essential (primary) hypertension: Secondary | ICD-10-CM

## 2024-03-16 LAB — CUP PACEART REMOTE DEVICE CHECK
Date Time Interrogation Session: 20250727233639
Implantable Pulse Generator Implant Date: 20231219

## 2024-03-16 MED ORDER — PANTOPRAZOLE SODIUM 40 MG PO TBEC
DELAYED_RELEASE_TABLET | ORAL | 1 refills | Status: DC
  Filled 2024-03-16: qty 60, fill #0
  Filled 2024-04-20: qty 60, 30d supply, fill #0

## 2024-03-16 NOTE — Progress Notes (Signed)
 Subjective:    Patient ID: Emily Osborne, female    DOB: [birth year], 63 y.o.   MRN: [id number]  Chief Complaint  Patient presents with   Annual Exam    HPI Patient is in today for cpe.   Discussed the use of AI scribe software for clinical note transcription with the patient, who gave verbal consent to proceed.  History of Present Illness Emily Osborne is a 63 year old female who presents for an annual physical exam.  She is reluctant to undergo a pap smear, noting her previous tests have always been normal, with the last one conducted in 2019. She is overdue for a colonoscopy, initially due in February, and has an appointment scheduled on September 22nd. She has a history of polyps, which were previously removed.  She recently visited the eye doctor and was advised to increase the strength of her reading glasses. She was also advised to wear sunglasses due to the early formation of cataracts.  She has not received a pneumonia vaccine in the past five years. She clarified that she does not have diabetes, despite being previously prescribed metformin  to aid in weight loss, which caused brain fog and did not help with weight loss.  She experiences fluid retention, particularly in her right leg, which swells and becomes tight and shiny. She occasionally uses Lasix  to manage this, but finds it inconvenient due to the need to stay near a bathroom. She noted significant diuresis after taking a dose a few days ago, which reduced the swelling.  She had a scare with chest pain, initially thought to be a myocardial infarction. Her troponin levels were elevated but not high enough to confirm an MI. She underwent a CT scan and was told she had mild coronary artery disease. She is currently on atorvastatin  and is monitoring her LDL levels, which were 84 in September last year, up from 15 the previous year.  She has a history of osteoarthritis, particularly affecting her hands, which have been  aching for years. She manages this with exercises and dietary adjustments. She finds warmth helps alleviate her symptoms.  Her family history includes a sister who had gastric bypass surgery and is now experiencing severe weight loss and malabsorption issues.    Past Medical History:  Diagnosis Date   Allergy    Anxiety    Asthma    pt has inhaler   Back pain    C. difficile colitis    Cataract    bil eyes   Chest pain    Chronic fatigue syndrome    Clostridium difficile colitis 12/13/2016   Diverticulitis    Dyspnea    Dysrhythmia    palpitations   Edema    Elevated blood pressure reading without diagnosis of hypertension    just elevated when I'm in pain (12/07/2015)   Fatty liver    Fibromyalgia    Ganglion of joint    right wrist   GERD (gastroesophageal reflux disease)    H. pylori infection    Headache    hx of   Helicobacter pylori (H. pylori) infection 12/13/2016   Herpes zoster without mention of complication    HTN (hypertension)    Hyperglycemia 08/07/2018   Hyperlipidemia    Hyperthyroidism    IBS (irritable bowel syndrome)    Leg edema    Multiple drug allergies 06/18/2017   Myalgia    Nontraumatic rupture of Achilles tendon    Other acute reactions to stress  Pain in joint, shoulder region    Pain in joint, upper arm    Pain in limb    Palpitations    Personal history of other diseases of digestive system    Pneumonia    once   PONV (postoperative nausea and vomiting)    Routine screening for STI (sexually transmitted infection) 12/13/2016   S/P partial colectomy 06/18/2017   Thyroid  disease    hyperthyroidism   Vitamin B 12 deficiency     Past Surgical History:  Procedure Laterality Date   ABDOMINAL HYSTERECTOMY     ACHILLES TENDON REPAIR Right 2005   BUBBLE STUDY  07/09/2022   Procedure: BUBBLE STUDY;  Surgeon: Lonni Slain, MD;  Location: [facility] ENDOSCOPY;  Service: Cardiovascular;;   BUNIONECTOMY Bilateral    Bunionectomy  1983   COLON SURGERY  01/23/2017   6 to 8 inches sigmoid colon removed   COLONOSCOPY     GANGLION CYST EXCISION Right    wrist   LEFT HEART CATH AND CORONARY ANGIOGRAPHY N/A 10/03/2016   Procedure: Left Heart Cath and Coronary Angiography;  Surgeon: Lonni JONETTA Cash, MD;  Location[facility] INVASIVE CV LAB;  Service: Cardiovascular;  Laterality: N/A;   LOOP RECORDER IMPLANT  07/2022   MENISCUS REPAIR Right 04/2014   POLYPECTOMY     TEE WITHOUT CARDIOVERSION N/A 07/09/2022   Procedure: TRANSESOPHAGEAL ECHOCARDIOGRAM (TEE);  Surgeon: Lonni Slain, MD;  Location: [facility] ENDOSCOPY;  Service: Cardiovascular;  Laterality: N/A;   TUBAL LIGATION     wisdomteeth extraction      Family History  Problem Relation Age of Onset   Hyperlipidemia Mother    Hypertension Mother        Died, 28   Thyroid  disease Mother        Thyroid  surgery   Obesity Mother    Heart disease Mother    Hypertension Father    Diabetes Father    Heart disease Father    Kidney disease Father        Died, 91   Hyperlipidemia Father    Pernicious anemia Sister    Thyroid  disease Sister        On thyroid  Rx   Atrial fibrillation Sister    Other Sister        hypoglycemia   Leukemia Brother    Cancer Brother        myleoblastic anemia   Diabetes Brother    Coronary artery disease Brother    Hypertension Brother    Aplastic anemia Daughter    Hypertension Daughter    Migraines Daughter    GER disease Daughter    Other Daughter        pericarditis   Bipolar disorder Daughter    Schizophrenia Daughter    Colon cancer Neg Hx    Esophageal cancer Neg Hx    Rectal cancer Neg Hx    Stomach cancer Neg Hx    Pancreatic cancer Neg Hx    Prostate cancer Neg Hx    Colon polyps Neg Hx     Social History   Socioeconomic History   Marital status: Divorced    Spouse name: Not on file   Number of children: 2   Years of education: Not on file   Highest education level: Not on file  Occupational History    Occupation: Contractor  Tobacco Use   Smoking status: Never    Passive exposure: Past   Smokeless tobacco: Never  Vaping Use   Vaping status: Never Used  Substance and Sexual Activity   Alcohol use: No    Alcohol/week: 0.0 standard drinks of alcohol   Drug use: No   Sexual activity: Not Currently    Partners: Male  Other Topics Concern   Not on file  Social History Narrative   Lives with alone.  She has two daughter.   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   Right Handed   Caffeine 12 oz to 24 oz a day   Social Drivers of Corporate investment banker Strain: Not on file  Food Insecurity: Not on file  Transportation Needs: Not on file  Physical Activity: Not on file  Stress: Not on file  Social Connections: Not on file  Intimate Partner Violence: Not on file    Outpatient Medications Prior to Visit  Medication Sig Dispense Refill   acetaminophen  (TYLENOL ) 650 MG CR tablet Take 650 mg by mouth as needed for pain.     albuterol  (VENTOLIN  HFA) 108 (90 Base) MCG/ACT inhaler Inhale 2 puffs into the lungs every 4 (four) hours as needed for wheezing or shortness of breath. 18 g 2   ALPRAZolam  (XANAX ) 0.25 MG tablet Take 1 tablet (0.25 mg total) by mouth 3 (three) times daily as needed. 30 tablet 2   atorvastatin  (LIPITOR) 40 MG tablet Take 1 tablet (40 mg) by mouth daily. 90 tablet 1   Blood Glucose Monitoring Suppl (FREESTYLE FREEDOM) KIT 1 Device by Does not apply route daily. 1 each 0   budesonide -formoterol  (SYMBICORT ) 80-4.5 MCG/ACT inhaler Inhale 2 puffs into the lungs 2 (two) times daily, in the morning and 12 hours later. 10.2 g 0   cholecalciferol  (VITAMIN D3) 25 MCG (1000 UNIT) tablet 2,000 units 6 days weekly.     clopidogrel  (PLAVIX ) 75 MG tablet Take 1 tablet (75 mg total) by mouth daily. 90 tablet 3   diclofenac  sodium (VOLTAREN ) 1 % GEL Apply 2 g topically 4 (four) times daily. Rub into affected area of foot 2 to 4 times daily 100 g 2    EPINEPHrine  (EPIPEN  2-PAK) 0.3 mg/0.3 mL IJ SOAJ injection Inject 0.3 mg into the muscle as needed for anaphylaxis. 2 each 2   famotidine  (PEPCID ) 20 MG tablet Take 1 tablet (20 mg) by mouth daily after supper 30 tablet 11   fluticasone  (FLONASE ) 50 MCG/ACT nasal spray PLACE 2 SPRAYS INTO BOTH NOSTRILS DAILY. 16 g 5   furosemide  (LASIX ) 40 MG tablet Take 1 tablet (40 mg total) by mouth daily. 90 tablet 3   glucose blood (FREESTYLE LITE) test strip USE TO CHECK BLOOD SUGAR ONCE A DAY 100 each 1   levocetirizine (XYZAL ) 5 MG tablet Take 1 tablet (5 mg total) by mouth every evening. 90 tablet 1   lidocaine  (LIDODERM ) 5 % Apply to the affected area once daily as needed for a week. **Remove patch after 12 hours** 30 patch 0   meclizine  (ANTIVERT ) 12.5 MG tablet Take 1 tablet (12.5 mg total) by mouth 2 (two) times daily as needed for dizziness. 30 tablet 1   methimazole  (TAPAZOLE ) 5 MG tablet Take 1 tablet (5 mg total) by mouth 3 (three) days a week (M,W,F) 45 tablet 3   pantoprazole  (PROTONIX ) 40 MG tablet Take 40 mg one to two times daily. PLEASE keep your upcoming September appointment for more refills. Thank you 60 tablet 1   Probiotic Product (ALIGN) 4 MG CAPS Take 1 capsule (4 mg total) by mouth daily. 90 capsule 3  promethazine -dextromethorphan (PROMETHAZINE -DM) 6.25-15 MG/5ML syrup Take 5 mLs by mouth 4 (four) times daily as needed. 118 mL 0   sucralfate  (CARAFATE ) 1 g tablet Take 1 tablet by mouth every 6 hours as needed. 60 tablet 3   triamcinolone  ointment (KENALOG ) 0.1 % Apply as directed to affected area twice a day 454 g 3   valACYclovir  (VALTREX ) 1000 MG tablet Take 1 tablet (1,000 mg total) by mouth 3 (three) times daily as needed. 30 tablet 2   Vitamin D , Ergocalciferol , (DRISDOL ) 1.25 MG (50000 UNIT) CAPS capsule Take 1 capsule (50,000 Units total) by mouth every 7 (seven) days. 12 capsule 1   Biotin 2.5 MG CAPS Take 2.5 mg by mouth every other day.     metoprolol  tartrate (LOPRESSOR )  100 MG tablet Take one (1) tablet ( 100 mg) by mouth  2 hours prior to CT. 1 tablet 0   No facility-administered medications prior to visit.    Allergies  Allergen Reactions   Aspirin Anaphylaxis   Gentamicin Other (See Comments)    Eye drops turned the sclera bright red   Ibuprofen Anaphylaxis   Metronidazole Swelling and Other (See Comments)    Red face/swelling   Nsaids Anaphylaxis   Augmentin  [Amoxicillin -Pot Clavulanate]    Doxycycline  Nausea And Vomiting and Other (See Comments)    Severe nausea/vomiting   Influenza A (H1n1) Monoval Vac Other (See Comments)    Sick for 3 weeks   Loratadine  Other (See Comments)    Fatigue/weakness   Metformin  And Related Other (See Comments)    Memory issues and headache   Periactin [Cyproheptadine] Other (See Comments)    Paranoid    Ranitidine Hcl Other (See Comments)    Lips turned red /peel   Sulfamethoxazole-Trimethoprim Other (See Comments)    Face red/peel   Tramadol Hcl Other (See Comments)    Paranoid    Review of Systems  Constitutional:  Negative for fever and malaise/fatigue.  HENT:  Negative for congestion.   Eyes:  Negative for blurred vision.  Respiratory:  Negative for shortness of breath.   Cardiovascular:  Negative for chest pain, palpitations and leg swelling.  Gastrointestinal:  Negative for abdominal pain, blood in stool and nausea.  Genitourinary:  Negative for dysuria and frequency.  Musculoskeletal:  Negative for falls.  Skin:  Negative for rash.  Neurological:  Negative for dizziness, loss of consciousness and headaches.  Endo/Heme/Allergies:  Negative for environmental allergies.  Psychiatric/Behavioral:  Negative for depression. The patient is not nervous/anxious.        Objective:    Physical Exam Vitals and nursing note reviewed.  Constitutional:      General: She is not in acute distress.    Appearance: Normal appearance. She is well-developed.  HENT:     Head: Normocephalic and atraumatic.      Right Ear: Tympanic membrane, ear canal and external ear normal. There is no impacted cerumen.     Left Ear: Tympanic membrane, ear canal and external ear normal. There is no impacted cerumen.     Nose: Nose normal.     Mouth/Throat:     Mouth: Mucous membranes are moist.     Pharynx: Oropharynx is clear. No oropharyngeal exudate or posterior oropharyngeal erythema.  Eyes:     General: No scleral icterus.       Right eye: No discharge.        Left eye: No discharge.     Conjunctiva/sclera: Conjunctivae normal.     Pupils: Pupils are equal,  round, and reactive to light.  Neck:     Thyroid : No thyromegaly or thyroid  tenderness.     Vascular: No JVD.  Cardiovascular:     Rate and Rhythm: Normal rate and regular rhythm.     Heart sounds: Normal heart sounds. No murmur heard. Pulmonary:     Effort: Pulmonary effort is normal. No respiratory distress.     Breath sounds: Normal breath sounds.  Abdominal:     General: Bowel sounds are normal. There is no distension.     Palpations: Abdomen is soft. There is no mass.     Tenderness: There is no abdominal tenderness. There is no guarding or rebound.  Musculoskeletal:        General: Normal range of motion.     Cervical back: Normal range of motion and neck supple.     Right lower leg: No edema.     Left lower leg: No edema.  Lymphadenopathy:     Cervical: No cervical adenopathy.  Skin:    General: Skin is warm and dry.     Findings: No erythema or rash.  Neurological:     General: No focal deficit present.     Mental Status: She is alert and oriented to person, place, and time.     Cranial Nerves: No cranial nerve deficit.     Deep Tendon Reflexes: Reflexes are normal and symmetric.  Psychiatric:        Mood and Affect: Mood normal.        Behavior: Behavior normal.        Thought Content: Thought content normal.        Judgment: Judgment normal.     BP 107/76 (BP Location: Right Arm, Patient Position: Sitting, Cuff Size:  Large)   Pulse 87   Temp 99.3 F (37.4 C) (Oral)   Resp 16   Ht 5' 5 (1.651 m)   Wt 254 lb (115.2 kg)   SpO2 97%   BMI 42.27 kg/m  Wt Readings from Last 3 Encounters:  03/16/24 254 lb (115.2 kg)  02/17/24 251 lb (113.9 kg)  02/04/24 246 lb (111.6 kg)    Diabetic Foot Exam - Simple   No data filed    Lab Results  Component Value Date   WBC 6.2 02/04/2024   HGB 13.4 02/04/2024   HCT 41.0 02/04/2024   PLT 243 02/04/2024   GLUCOSE 85 02/04/2024   CHOL 160 04/29/2023   TRIG 105.0 04/29/2023   HDL 55.40 04/29/2023   LDLDIRECT 139.4 12/26/2011   LDLCALC 84 04/29/2023   ALT 21 02/04/2024   AST 23 02/04/2024   NA 138 02/04/2024   K 4.1 02/04/2024   CL 105 02/04/2024   CREATININE 0.93 02/04/2024   BUN 13 02/04/2024   CO2 21 (L) 02/04/2024   TSH 1.68 10/16/2023   INR 1.0 04/10/2023   HGBA1C 6.2 12/18/2022    Lab Results  Component Value Date   TSH 1.68 10/16/2023   Lab Results  Component Value Date   WBC 6.2 02/04/2024   HGB 13.4 02/04/2024   HCT 41.0 02/04/2024   MCV 88.6 02/04/2024   PLT 243 02/04/2024   Lab Results  Component Value Date   NA 138 02/04/2024   K 4.1 02/04/2024   CO2 21 (L) 02/04/2024   GLUCOSE 85 02/04/2024   BUN 13 02/04/2024   CREATININE 0.93 02/04/2024   BILITOT 0.8 02/04/2024   ALKPHOS 62 02/04/2024   AST 23 02/04/2024   ALT 21 02/04/2024  PROT 7.1 02/04/2024   ALBUMIN 3.9 02/04/2024   CALCIUM  9.3 02/04/2024   ANIONGAP 12 02/04/2024   EGFR 76 12/12/2023   GFR 63.07 04/29/2023   Lab Results  Component Value Date   CHOL 160 04/29/2023   Lab Results  Component Value Date   HDL 55.40 04/29/2023   Lab Results  Component Value Date   LDLCALC 84 04/29/2023   Lab Results  Component Value Date   TRIG 105.0 04/29/2023   Lab Results  Component Value Date   CHOLHDL 3 04/29/2023   Lab Results  Component Value Date   HGBA1C 6.2 12/18/2022       Assessment & Plan:  Preventative health care Assessment & Plan: Ghm  utd Check labs  See AVS Health Maintenance  Topic Date Due   Diabetic kidney evaluation - Urine ACR  Never done   Pneumococcal Vaccine 53-48 Years old (2 of 2 - PCV) 09/20/2012   FOOT EXAM  08/30/2020   OPHTHALMOLOGY EXAM  11/10/2021   COVID-19 Vaccine (6 - 2024-25 season) 04/21/2023   HEMOGLOBIN A1C  06/19/2023   Cervical Cancer Screening (HPV/Pap Cotest)  08/08/2023   Colonoscopy  10/17/2023   MAMMOGRAM  04/09/2024   Diabetic kidney evaluation - eGFR measurement  02/03/2025   DTaP/Tdap/Td (4 - Td or Tdap) 07/02/2027   Hepatitis C Screening  Completed   HIV Screening  Completed   Zoster Vaccines- Shingrix  Completed   Hepatitis B Vaccines  Aged Out   HPV VACCINES  Aged Out   Meningococcal B Vaccine  Aged Out     Orders: -     CBC with Differential/Platelet -     Comprehensive metabolic panel with GFR -     Lipid panel -     TSH  Need for pneumococcal 20-valent conjugate vaccination -     Pneumococcal conjugate vaccine 20-valent  Primary hypertension Assessment & Plan: Well controlled, no changes to meds. Encouraged heart healthy diet such as the DASH diet and exercise as tolerated.    Orders: -     CBC with Differential/Platelet -     Comprehensive metabolic panel with GFR -     Lipid panel -     TSH  Lower extremity edema -     CBC with Differential/Platelet -     Comprehensive metabolic panel with GFR -     Lipid panel -     TSH  Hyperlipidemia, unspecified hyperlipidemia type -     CBC with Differential/Platelet -     Comprehensive metabolic panel with GFR -     Lipid panel -     TSH  Hyperglycemia -     Hemoglobin A1c -     Insulin , random  Assessment and Plan Assessment & Plan Colonoscopy follow-up   She is overdue for a colonoscopy, initially due in February, due to a history of polyps requiring regular surveillance. Her next appointment with Dr. Bernardo is on September 22nd to discuss colonoscopy follow-up.  Mild nonobstructive coronary artery  disease (CAD)   Recent cardiac evaluation for chest pain revealed mild nonobstructive CAD. Her LDL cholesterol increased from less than 70 to 84. She is on atorvastatin , with a potential switch to Crestor  if LDL remains elevated, though she previously experienced muscle pain with Crestor . Check current LDL levels and consider switching to Crestor  if necessary, with caution.  Patent foramen ovale (PFO)   An incidentally found PFO is asymptomatic and requires no intervention. -- f/u cardiology  Fluid retention  Occasional right leg swelling resolves with Lasix . Consider daily Lasix  use if swelling persists, with caution regarding increased urination.  Osteoarthritis   Osteoarthritis primarily affects her hands, with intermittent pain relieved by heat. Continue dietary and exercise recommendations for management.  Cataracts   A small cataract was noted by her eye doctor, who advised wearing sunglasses to slow progression.  General Health Maintenance   She is due for a pneumonia vaccine, recommended every five years due to age, and declines the flu shot at this time.  Follow-up   Scheduled follow-up appointments include a GI appointment on September 22nd for colonoscopy follow-up and a cardiology appointment in September for PFO and CAD management.    Lailani Tool R Lowne [name], DO

## 2024-03-16 NOTE — Assessment & Plan Note (Signed)
 Ghm utd Check labs  See AVS Health Maintenance  Topic Date Due   Diabetic kidney evaluation - Urine ACR  Never done   Pneumococcal Vaccine 20-64 Years old (2 of 2 - PCV) 09/20/2012   FOOT EXAM  08/30/2020   OPHTHALMOLOGY EXAM  11/10/2021   COVID-19 Vaccine (6 - 2024-25 season) 04/21/2023   HEMOGLOBIN A1C  06/19/2023   Cervical Cancer Screening (HPV/Pap Cotest)  08/08/2023   Colonoscopy  10/17/2023   MAMMOGRAM  04/09/2024   Diabetic kidney evaluation - eGFR measurement  02/03/2025   DTaP/Tdap/Td (4 - Td or Tdap) 07/02/2027   Hepatitis C Screening  Completed   HIV Screening  Completed   Zoster Vaccines- Shingrix  Completed   Hepatitis B Vaccines  Aged Out   HPV VACCINES  Aged Out   Meningococcal B Vaccine  Aged Out

## 2024-03-16 NOTE — Assessment & Plan Note (Signed)
 Well controlled, no changes to meds. Encouraged heart healthy diet such as the DASH diet and exercise as tolerated.

## 2024-03-17 ENCOUNTER — Encounter: Payer: Self-pay | Admitting: *Deleted

## 2024-03-17 ENCOUNTER — Ambulatory Visit: Payer: Self-pay | Admitting: Family Medicine

## 2024-03-17 LAB — INSULIN, RANDOM: Insulin: 102.8 u[IU]/mL — ABNORMAL HIGH

## 2024-03-17 LAB — COMPREHENSIVE METABOLIC PANEL WITH GFR
ALT: 19 U/L (ref 0–35)
AST: 19 U/L (ref 0–37)
Albumin: 4.4 g/dL (ref 3.5–5.2)
Alkaline Phosphatase: 76 U/L (ref 39–117)
BUN: 10 mg/dL (ref 6–23)
CO2: 29 meq/L (ref 19–32)
Calcium: 9.5 mg/dL (ref 8.4–10.5)
Chloride: 104 meq/L (ref 96–112)
Creatinine, Ser: 1.09 mg/dL (ref 0.40–1.20)
GFR: 53.82 mL/min — ABNORMAL LOW (ref >60.00)
Glucose, Bld: 103 mg/dL — ABNORMAL HIGH (ref 70–99)
Potassium: 4.3 meq/L (ref 3.5–5.1)
Sodium: 142 meq/L (ref 135–145)
Total Bilirubin: 0.5 mg/dL (ref 0.2–1.2)
Total Protein: 6.9 g/dL (ref 6.0–8.3)

## 2024-03-17 LAB — CBC WITH DIFFERENTIAL/PLATELET
Basophils Absolute: 0 K/uL (ref 0.0–0.1)
Basophils Relative: 0.7 % (ref 0.0–3.0)
Eosinophils Absolute: 0.2 K/uL (ref 0.0–0.7)
Eosinophils Relative: 3.5 % (ref 0.0–5.0)
HCT: 41 % (ref 36.0–46.0)
Hemoglobin: 13.6 g/dL (ref 12.0–15.0)
Lymphocytes Relative: 36.5 % (ref 12.0–46.0)
Lymphs Abs: 2.1 K/uL (ref 0.7–4.0)
MCHC: 33.2 g/dL (ref 30.0–36.0)
MCV: 88.5 fl (ref 78.0–100.0)
Monocytes Absolute: 0.6 K/uL (ref 0.1–1.0)
Monocytes Relative: 10 % (ref 3.0–12.0)
Neutro Abs: 2.8 K/uL (ref 1.4–7.7)
Neutrophils Relative %: 49.3 % (ref 43.0–77.0)
Platelets: 235 K/uL (ref 150.0–400.0)
RBC: 4.63 Mil/uL (ref 3.87–5.11)
RDW: 14.6 % (ref 11.5–15.5)
WBC: 5.7 K/uL (ref 4.0–10.5)

## 2024-03-17 LAB — HEMOGLOBIN A1C: Hgb A1c MFr Bld: 6.2 % (ref 4.6–6.5)

## 2024-03-17 LAB — LIPID PANEL
Cholesterol: 144 mg/dL (ref 0–200)
HDL: 44.9 mg/dL (ref >39.00)
LDL Cholesterol: 66 mg/dL (ref 0–99)
NonHDL: 99.01
Total CHOL/HDL Ratio: 3
Triglycerides: 163 mg/dL — ABNORMAL HIGH (ref 0.0–149.0)
VLDL: 32.6 mg/dL (ref 0.0–40.0)

## 2024-03-17 LAB — TSH: TSH: 0.98 u[IU]/mL (ref 0.35–5.50)

## 2024-03-30 ENCOUNTER — Ambulatory Visit: Payer: Self-pay | Admitting: Cardiovascular Disease

## 2024-03-30 ENCOUNTER — Ambulatory Visit (INDEPENDENT_AMBULATORY_CARE_PROVIDER_SITE_OTHER)

## 2024-03-30 DIAGNOSIS — I340 Nonrheumatic mitral (valve) insufficiency: Secondary | ICD-10-CM | POA: Diagnosis not present

## 2024-03-30 DIAGNOSIS — R072 Precordial pain: Secondary | ICD-10-CM

## 2024-03-30 LAB — ECHOCARDIOGRAM COMPLETE
Area-P 1/2: 3.48 cm2
MV M vel: 4.7 m/s
MV Peak grad: 88.4 mmHg
S' Lateral: 3.01 cm

## 2024-04-06 ENCOUNTER — Other Ambulatory Visit (HOSPITAL_COMMUNITY): Payer: Self-pay

## 2024-04-06 ENCOUNTER — Other Ambulatory Visit: Payer: Self-pay | Admitting: Family Medicine

## 2024-04-06 MED ORDER — VITAMIN D (ERGOCALCIFEROL) 1.25 MG (50000 UNIT) PO CAPS
50000.0000 [IU] | ORAL_CAPSULE | ORAL | 1 refills | Status: DC
  Filled 2024-04-06: qty 12, 84d supply, fill #0
  Filled 2024-06-25: qty 12, 84d supply, fill #1

## 2024-04-13 ENCOUNTER — Ambulatory Visit

## 2024-04-16 ENCOUNTER — Ambulatory Visit

## 2024-04-16 DIAGNOSIS — I639 Cerebral infarction, unspecified: Secondary | ICD-10-CM | POA: Diagnosis not present

## 2024-04-16 LAB — CUP PACEART REMOTE DEVICE CHECK
Date Time Interrogation Session: 20250827232819
Implantable Pulse Generator Implant Date: 20231219

## 2024-04-20 ENCOUNTER — Other Ambulatory Visit: Payer: Self-pay

## 2024-04-20 ENCOUNTER — Other Ambulatory Visit (HOSPITAL_COMMUNITY): Payer: Self-pay

## 2024-04-20 ENCOUNTER — Other Ambulatory Visit: Payer: Self-pay | Admitting: Family Medicine

## 2024-04-20 DIAGNOSIS — F419 Anxiety disorder, unspecified: Secondary | ICD-10-CM

## 2024-04-21 ENCOUNTER — Other Ambulatory Visit: Payer: Self-pay

## 2024-04-21 ENCOUNTER — Other Ambulatory Visit (HOSPITAL_COMMUNITY): Payer: Self-pay

## 2024-04-21 MED ORDER — LIDOCAINE 5 % EX PTCH
MEDICATED_PATCH | CUTANEOUS | 0 refills | Status: AC
Start: 2024-04-21 — End: ?
  Filled 2024-04-21: qty 30, 30d supply, fill #0

## 2024-04-21 MED ORDER — ALPRAZOLAM 0.25 MG PO TABS
0.2500 mg | ORAL_TABLET | Freq: Three times a day (TID) | ORAL | 2 refills | Status: AC | PRN
Start: 2024-04-21 — End: ?
  Filled 2024-04-21: qty 30, 10d supply, fill #0

## 2024-04-21 NOTE — Telephone Encounter (Signed)
 Requesting: alprazolam  0.25mg   Contract: None seen  UDS: 10/11/2017 Last Visit: 03/16/24 Next Visit:  None Last Refill: 03/14/23 #30 and 2RF   Please Advise

## 2024-04-23 ENCOUNTER — Ambulatory Visit: Payer: Self-pay | Admitting: Cardiovascular Disease

## 2024-04-24 NOTE — Progress Notes (Signed)
 Remote Loop Recorder Transmission

## 2024-04-29 ENCOUNTER — Emergency Department (HOSPITAL_BASED_OUTPATIENT_CLINIC_OR_DEPARTMENT_OTHER)

## 2024-04-29 ENCOUNTER — Other Ambulatory Visit (HOSPITAL_BASED_OUTPATIENT_CLINIC_OR_DEPARTMENT_OTHER): Payer: Self-pay

## 2024-04-29 ENCOUNTER — Encounter (HOSPITAL_BASED_OUTPATIENT_CLINIC_OR_DEPARTMENT_OTHER): Payer: Self-pay

## 2024-04-29 ENCOUNTER — Other Ambulatory Visit: Payer: Self-pay

## 2024-04-29 ENCOUNTER — Emergency Department (HOSPITAL_BASED_OUTPATIENT_CLINIC_OR_DEPARTMENT_OTHER)
Admission: EM | Admit: 2024-04-29 | Discharge: 2024-04-29 | Disposition: A | Discharge status: Home / Self Care | Attending: Emergency Medicine | Admitting: Emergency Medicine

## 2024-04-29 DIAGNOSIS — M79602 Pain in left arm: Secondary | ICD-10-CM | POA: Insufficient documentation

## 2024-04-29 DIAGNOSIS — Z79899 Other long term (current) drug therapy: Secondary | ICD-10-CM | POA: Insufficient documentation

## 2024-04-29 DIAGNOSIS — Z7902 Long term (current) use of antithrombotics/antiplatelets: Secondary | ICD-10-CM | POA: Diagnosis not present

## 2024-04-29 DIAGNOSIS — R200 Anesthesia of skin: Secondary | ICD-10-CM

## 2024-04-29 DIAGNOSIS — I10 Essential (primary) hypertension: Secondary | ICD-10-CM | POA: Diagnosis not present

## 2024-04-29 DIAGNOSIS — E039 Hypothyroidism, unspecified: Secondary | ICD-10-CM | POA: Insufficient documentation

## 2024-04-29 DIAGNOSIS — Z8673 Personal history of transient ischemic attack (TIA), and cerebral infarction without residual deficits: Secondary | ICD-10-CM | POA: Insufficient documentation

## 2024-04-29 DIAGNOSIS — Z87891 Personal history of nicotine dependence: Secondary | ICD-10-CM | POA: Diagnosis not present

## 2024-04-29 LAB — COMPREHENSIVE METABOLIC PANEL WITH GFR
ALT: 26 U/L (ref 0–44)
AST: 29 U/L (ref 15–41)
Albumin: 4.4 g/dL (ref 3.5–5.0)
Alkaline Phosphatase: 79 U/L (ref 38–126)
Anion gap: 12 (ref 5–15)
BUN: 13 mg/dL (ref 8–23)
CO2: 23 mmol/L (ref 22–32)
Calcium: 9.5 mg/dL (ref 8.9–10.3)
Chloride: 106 mmol/L (ref 98–111)
Creatinine, Ser: 0.97 mg/dL (ref 0.44–1.00)
GFR, Estimated: >60 mL/min (ref >60)
Glucose, Bld: 111 mg/dL — ABNORMAL HIGH (ref 70–99)
Potassium: 4.3 mmol/L (ref 3.5–5.1)
Sodium: 141 mmol/L (ref 135–145)
Total Bilirubin: 0.5 mg/dL (ref 0.0–1.2)
Total Protein: 7.4 g/dL (ref 6.5–8.1)

## 2024-04-29 LAB — TROPONIN T, HIGH SENSITIVITY
Troponin T High Sensitivity: <15 ng/L (ref 0–19)
Troponin T High Sensitivity: <15 ng/L (ref 0–19)

## 2024-04-29 LAB — CBC
HCT: 40.5 % (ref 36.0–46.0)
Hemoglobin: 13.5 g/dL (ref 12.0–15.0)
MCH: 28.8 pg (ref 26.0–34.0)
MCHC: 33.3 g/dL (ref 30.0–36.0)
MCV: 86.4 fL (ref 80.0–100.0)
Platelets: 253 K/uL (ref 150–400)
RBC: 4.69 MIL/uL (ref 3.87–5.11)
RDW: 14.3 % (ref 11.5–15.5)
WBC: 4.5 K/uL (ref 4.0–10.5)
nRBC: 0 % (ref 0.0–0.2)

## 2024-04-29 MED ORDER — METHYLPREDNISOLONE 4 MG PO TBPK
ORAL_TABLET | ORAL | 0 refills | Status: DC
  Filled 2024-04-29: qty 21, 6d supply, fill #0

## 2024-04-29 MED ORDER — ONDANSETRON HCL 4 MG/2ML IJ SOLN
4.0000 mg | Freq: Once | INTRAMUSCULAR | Status: AC
  Administered 2024-04-29: 4 mg via INTRAVENOUS
  Filled 2024-04-29: qty 2

## 2024-04-29 MED ORDER — IOHEXOL 350 MG/ML SOLN
100.0000 mL | Freq: Once | INTRAVENOUS | Status: AC | PRN
  Administered 2024-04-29: 75 mL via INTRAVENOUS

## 2024-04-29 MED ORDER — METHYLPREDNISOLONE SODIUM SUCC 125 MG IJ SOLR
125.0000 mg | Freq: Once | INTRAMUSCULAR | Status: AC
  Administered 2024-04-29: 125 mg via INTRAVENOUS
  Filled 2024-04-29: qty 2

## 2024-04-29 NOTE — ED Provider Notes (Signed)
 [place] [facility] Provider Note   CSN: [id number] Arrival date & time: 04/29/24  9088     Patient presents with: Chest Pain   Emily Osborne is a 63 y.o. female.    Chest Pain   63 year old female presents emergency department with complaints of left arm pain, left arm tingling/numbness.  States that she woke up around 5/530 AM this morning.  Around 6, developed pain in her left upper arm.  States symptoms did improve over the next several minutes.  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  States that she was sitting down at her desk when the symptoms worsen.  Developed tingling/numbness left side of neck all the way down to her left hand.  States that her left hand does feel little odd due to the numbness sensations.  States that she does feel little weak in her left hand.  Denies any pain in her chest, shortness of breath, exertional worsening of symptoms.  Denies any gait disturbance, gait abnormality, slurred speech, facial droop, other weakness or sensory deficit in bilateral upper or lower extremities.  Denies any trauma.  Patient does state that she was feeling nauseous earlier when her symptoms were worse.   Past medical history significant for fibromyalgia, cryptogenic stroke, IBS, GERD, hyperlipidemia, pain in shoulder, arthritis, diverticulitis, H. pylori via C. difficile, hypothyroidism, chronic fatigue syndrome, hypertension  Prior to Admission medications   Medication Sig Start Date End Date Taking? Authorizing Provider  acetaminophen  (TYLENOL ) 650 MG CR tablet Take 650 mg by mouth as needed for pain.    [provider]  albuterol  (VENTOLIN  HFA) 108 (90 Base) MCG/ACT inhaler Inhale 2 puffs into the lungs every 4 (four) hours as needed for wheezing or shortness of breath. 05/25/22   Darlean Ozell NOVAK, MD  ALPRAZolam  (XANAX ) 0.25 MG tablet Take 1 tablet (0.25 mg total) by mouth 3 (three) times daily as  needed. 04/21/24   Antonio Cyndee Jamee JONELLE, DO  atorvastatin  (LIPITOR) 40 MG tablet Take 1 tablet (40 mg) by mouth daily. 03/02/24   Antonio Cyndee Jamee JONELLE, DO  Blood Glucose Monitoring Suppl (FREESTYLE FREEDOM) KIT 1 Device by Does not apply route daily. 06/08/14   Lowne [name], Yvonne R, DO  budesonide -formoterol  (SYMBICORT ) 80-4.5 MCG/ACT inhaler Inhale 2 puffs into the lungs 2 (two) times daily, in the morning and 12 hours later. 11/25/23   Darlean Ozell NOVAK, MD  cholecalciferol  (VITAMIN D3) 25 MCG (1000 UNIT) tablet 2,000 units 6 days weekly.    [provider]  clopidogrel  (PLAVIX ) 75 MG tablet Take 1 tablet (75 mg total) by mouth daily. 11/21/23   Patel, Donika K, DO  diclofenac  sodium (VOLTAREN ) 1 % GEL Apply 2 g topically 4 (four) times daily. Rub into affected area of foot 2 to 4 times daily 10/02/18   Gershon Donnice JONELLE, DPM  EPINEPHrine  (EPIPEN  2-PAK) 0.3 mg/0.3 mL IJ SOAJ injection Inject 0.3 mg into the muscle as needed for anaphylaxis. 12/18/22   Lowne [name], Yvonne R, DO  famotidine  (PEPCID ) 20 MG tablet Take 1 tablet (20 mg) by mouth daily after supper 01/28/23   Guenther, Paula M, NP  fluticasone  (FLONASE ) 50 MCG/ACT nasal spray PLACE 2 SPRAYS INTO BOTH NOSTRILS DAILY. 03/02/24   Antonio Cyndee Jamee JONELLE, DO  furosemide  (LASIX ) 40 MG tablet Take 1 tablet (40 mg total) by mouth daily. 04/29/23   Antonio Cyndee Jamee R, DO  glucose blood (FREESTYLE LITE) test strip  USE TO CHECK BLOOD SUGAR ONCE A DAY 09/11/21   Antonio Meth, Yvonne R, DO  levocetirizine (XYZAL ) 5 MG tablet Take 1 tablet (5 mg total) by mouth every evening. 01/08/24   Antonio Meth, Yvonne R, DO  lidocaine  (LIDODERM ) 5 % Apply to the affected area once daily as needed for a week. **Remove patch after 12 hours** 04/21/24   Antonio Meth, Jamee SAUNDERS, DO  meclizine  (ANTIVERT ) 12.5 MG tablet Take 1 tablet (12.5 mg total) by mouth 2 (two) times daily as needed for dizziness. 04/16/23   Patel, Donika K, DO  methimazole  (TAPAZOLE ) 5 MG tablet Take 1  tablet (5 mg total) by mouth 3 (three) days a week (M,W,F) 10/18/23   Trixie File, MD  pantoprazole  (PROTONIX ) 40 MG tablet Take one tablet by mouth one to two times daily. PLEASE keep your upcoming September appointment for more refills. Thank you 03/16/24   Leigh Elspeth SQUIBB, MD  Probiotic Product (ALIGN) 4 MG CAPS Take 1 capsule (4 mg total) by mouth daily. 08/01/21   Antonio Meth Jamee SAUNDERS, DO  promethazine -dextromethorphan (PROMETHAZINE -DM) 6.25-15 MG/5ML syrup Take 5 mLs by mouth 4 (four) times daily as needed. 10/22/23   Antonio Meth Jamee R, DO  sucralfate  (CARAFATE ) 1 g tablet Take 1 tablet by mouth every 6 hours as needed. 12/18/22   Lowne [name], Yvonne R, DO  triamcinolone  ointment (KENALOG ) 0.1 % Apply as directed to affected area twice a day 04/15/23     valACYclovir  (VALTREX ) 1000 MG tablet Take 1 tablet (1,000 mg total) by mouth 3 (three) times daily as needed. 05/22/23   Antonio Meth Jamee R, DO  Vitamin D , Ergocalciferol , (DRISDOL ) 1.25 MG (50000 UNIT) CAPS capsule Take 1 capsule (50,000 Units total) by mouth every 7 (seven) days. 04/06/24   Antonio Meth Jamee SAUNDERS, DO    Allergies: Aspirin, Gentamicin, Ibuprofen, Metronidazole, Nsaids, Augmentin  [amoxicillin -pot clavulanate], Doxycycline , Influenza a (h1n1) monoval vac, Loratadine , Metformin  and related, Periactin [cyproheptadine], Ranitidine hcl, Sulfamethoxazole-trimethoprim, and Tramadol hcl    Review of Systems  Cardiovascular:  Positive for chest pain.  All other systems reviewed and are negative.   Updated Vital Signs BP (!) 149/103 (BP Location: Left Arm)   Pulse 74   Temp 97.9 F (36.6 C)   Resp 18   Wt 113.4 kg   SpO2 96%   BMI 41.60 kg/m   Physical Exam Vitals and nursing note reviewed.  Constitutional:      General: She is not in acute distress.    Appearance: She is well-developed.  HENT:     Head: Normocephalic and atraumatic.  Eyes:     Conjunctiva/sclera: Conjunctivae normal.  Cardiovascular:      Rate and Rhythm: Normal rate and regular rhythm.  Pulmonary:     Effort: Pulmonary effort is normal. No respiratory distress.     Breath sounds: Normal breath sounds. No wheezing, rhonchi or rales.  Abdominal:     Palpations: Abdomen is soft.     Tenderness: There is no abdominal tenderness.  Musculoskeletal:        General: No swelling.     Cervical back: Neck supple.     Right lower leg: No edema.     Left lower leg: No edema.     Comments: No obvious palpable tenderness left upper extremity/trapezius.  Pain worsened with overhead movements of left shoulder.  No midline or paraspinal tenderness in cervical region.  Skin:    General: Skin is warm and dry.     Capillary Refill:  Capillary refill takes less than 2 seconds.  Neurological:     Mental Status: She is alert.     Comments: Alert and oriented to self, place, time and event.   Speech is fluent, clear without dysarthria or dysphasia.   Strength 5/5 in upper/lower extremities.  Decree strength left-sided grip compared to right. Patient reporting decrease sensation from left side trapezius all the way down the left arm.  Normal gait.  No pronator drift.  Normal finger-to-nose and feet tapping.  CN I not tested  CN II not tested CN III, IV, VI PERRLA and EOMs intact bilaterally  CN V Intact sensation to sharp and light touch to the face  CN VII facial movements symmetric  CN VIII not tested  CN IX, X no uvula deviation, symmetric rise of soft palate  CN XI 5/5 SCM and trapezius strength bilaterally  CN XII Midline tongue protrusion, symmetric L/R movements     Psychiatric:        Mood and Affect: Mood normal.     (all labs ordered are listed, but only abnormal results are displayed) Labs Reviewed  BASIC METABOLIC PANEL WITH GFR  CBC  TROPONIN T, HIGH SENSITIVITY    EKG: None  Radiology: No results found.   Procedures   Medications Ordered in the ED - No data to display  Clinical Course as of 04/29/24  1107  Wed Apr 29, 2024  1027 Consulted attending Dr. Mannie regarding the patient and he was in agreement with treatment plan going forward Will discharge if workup negative. [CR]    Clinical Course User Index [CR] Silver Wonda LABOR, PA                                 Medical Decision Making Amount and/or Complexity of Data Reviewed Labs: ordered. Radiology: ordered.  Risk Prescription drug management.   This patient presents to the ED for concern of chest pain, this involves an extensive number of treatment options, and is a complaint that carries with it a high risk of complications and morbidity.  The differential diagnosis includes ACS, PE, aortic dissection, GERD, MSK, thorax, pericarditis/myocarditis/tamponade, cervical radiculopathy, arterial dissection, CVA, other   Co morbidities that complicate the patient evaluation  See HPI   Additional history obtained:  Additional history obtained from EMR External records from outside source obtained and reviewed including hospital records   Lab Tests:  I Ordered, and personally interpreted labs.  The pertinent results include: Delta negative troponin, no leukocytosis.  No evidence of anemia.  Platelets within range.  No electrolyte maladies.  No transaminitis.  No renal dysfunction.   Imaging Studies ordered:  I ordered imaging studies including chest x-ray, CT angio head and neck/cervical spine I independently visualized and interpreted imaging which showed  Chest x-ray: No acute cardiopulmonary abnormality CT angio head and neck: Negative CTA head and neck generalized arterial tortuosity with minimal atherosclerotic.  No acute intracranial abnormality. CT cervical spine: No acute osseous abnormality.  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  Query left C5 radiculitis. I agree with the radiologist interpretation  Cardiac Monitoring: / EKG:  The  patient was maintained on a cardiac monitor.  I personally viewed and interpreted the cardiac monitored which showed an underlying rhythm of: Sinus rhythm.  Abnormal or progression concerning for early transition.  T wave inversion in V1 as well as lead III.  Consultations Obtained:  See ED course  Problem List / ED Course / Critical interventions / Medication management  Cervical radiculopathy I ordered medication including Medrol , Zofran    Reevaluation of the patient after these medicines showed that the patient improved I have reviewed the patients home medicines and have made adjustments as needed   Social Determinants of Health:  History of cigarette use.  Denies illicit drug use.   Test / Admission - Considered:  Cervical radiculopathy Vitals signs significant for initial hypertension which resolved with time elapsed and medicines administered while emergency department.. Otherwise within normal range and stable throughout visit. Laboratory/imaging studies significant for: See above  63 year old female presents emergency department with complaints of left arm pain, left arm tingling/numbness.  States that she woke up around 5/530 AM this morning.  Around 6, developed pain in her left upper arm.  States symptoms did improve over the next several minutes.  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  States that she was sitting down at her desk when the symptoms worsen.  Developed tingling/numbness left side of neck all the way down to her left hand.  States that her left hand does feel little odd due to the numbness sensations.  States that she does feel little weak in her left hand.  Denies any pain in her chest, shortness of breath, exertional worsening of symptoms.  Denies any gait disturbance, gait abnormality, slurred speech, facial droop, other weakness or sensory deficit in bilateral upper or lower extremities.  Denies any trauma.  Patient does state that  she was feeling nauseous earlier when her symptoms were worse.  On exam initially, patient reporting pretty diffuse decree sensation on the left upper extremity when compared to the right.  No pulse deficit to suggest ischemic limb.  No upper extremity swelling/pitting edema concerning for DVT.  Given patient's radicular symptoms with intermittent headache the past couple of days, CT angio head and neck was performed which was grossly negative for any acute abnormality.  Patient did have cardiac workup begun in the triage area which was reassuring as well with also negative troponin, lack of acute ischemic change on EKG.  Patient's imaging of cervical spine concerning for left-sided severe left neuroforaminal stenosis  and query C5 radiculitis.  Initially, did appreciate decreased grip on the left when compared to right but difficult to ascertain clinically whether this is effort related or not.  Patient was treated with Solu-Medrol  in the emergency department and noted significant improvement of symptoms.  Repeat strength exam bilateral upper extremities showed muscular strength 5 out of 5 and patient reported strength feeling normal as well.  Was still describing some tingling/numbness to about mid humerus on the left side.  I do suspect that patient's symptoms most likely secondary to cervical radiculopathy.  Given patient's improvement with Solu-Medrol  in emergency department without true persistent weakness, will trial corticosteroid in the outpatient setting and recommend follow-up with spinal specialist.  Patient does already have established care with Ortho care from prior orthopedic related issues and would prefer to follow-up with this group.  Treatment plan discussed with patient and she is understanding was agreeable.  Patient well-appearing, afebrile in no acute distress upon discharge. Worrisome signs and symptoms were discussed with the patient, and the patient acknowledged understanding to  return to the ED if noticed. Patient was stable upon discharge.       Final diagnoses:  None    ED Discharge Orders  None          Silver Wonda LABOR, [place] 04/29/24 1302    Mannie Pac T, DO 05/03/24 1513

## 2024-04-29 NOTE — Discharge Instructions (Addendum)
 As discussed, your workup today was consistent with most likely cervical radiculopathy.  CT imaging of your neck did show some narrowing on the left side of your spine with some inflammation in the area of the narrowing.  CT imaging of your brain did not show obvious stroke.  The vessels in your neck and head appear normal.  Your cardiac workup was normal.  Will plan to send you home with a Medrol  Dosepak to treat the inflammation in your neck as well as recommend follow-up with orthopedics spinal specialist.  Please not hesitate to return to the emergency department if the worrisome signs and symptoms we discussed become apparent.

## 2024-04-29 NOTE — ED Notes (Signed)
 Patient transported to X-ray

## 2024-04-29 NOTE — ED Notes (Signed)
 Patient Alert and oriented to baseline. Stable and ambulatory to baseline. Patient verbalized understanding of the discharge instructions.  Patient belongings were taken by the patient.

## 2024-04-29 NOTE — ED Triage Notes (Signed)
 Reports L sided chest/shoulder pain, L arm pain/numbness, nausea since this morning. VAN negative  Denies SHOB, emesis  Takes plavix  daily

## 2024-04-30 ENCOUNTER — Encounter: Payer: Self-pay | Admitting: Family Medicine

## 2024-04-30 ENCOUNTER — Other Ambulatory Visit (HOSPITAL_BASED_OUTPATIENT_CLINIC_OR_DEPARTMENT_OTHER): Payer: Self-pay

## 2024-04-30 ENCOUNTER — Other Ambulatory Visit: Payer: Self-pay | Admitting: Family Medicine

## 2024-04-30 DIAGNOSIS — M5412 Radiculopathy, cervical region: Secondary | ICD-10-CM

## 2024-04-30 MED ORDER — PREDNISONE 10 MG PO TABS
ORAL_TABLET | ORAL | 0 refills | Status: AC
  Filled 2024-04-30 – 2024-05-01 (×2): qty 20, 12d supply, fill #0

## 2024-05-01 ENCOUNTER — Other Ambulatory Visit (HOSPITAL_BASED_OUTPATIENT_CLINIC_OR_DEPARTMENT_OTHER): Payer: Self-pay

## 2024-05-01 ENCOUNTER — Other Ambulatory Visit (HOSPITAL_COMMUNITY): Payer: Self-pay

## 2024-05-01 NOTE — Progress Notes (Signed)
 Remote Loop Recorder Transmission

## 2024-05-04 ENCOUNTER — Encounter: Payer: Self-pay | Admitting: Neurology

## 2024-05-04 ENCOUNTER — Ambulatory Visit: Admitting: Neurology

## 2024-05-04 VITALS — BP 129/87 | HR 60 | Ht 65.0 in | Wt 255.0 lb

## 2024-05-04 DIAGNOSIS — M5412 Radiculopathy, cervical region: Secondary | ICD-10-CM

## 2024-05-04 DIAGNOSIS — R4789 Other speech disturbances: Secondary | ICD-10-CM | POA: Diagnosis not present

## 2024-05-04 DIAGNOSIS — I639 Cerebral infarction, unspecified: Secondary | ICD-10-CM | POA: Diagnosis not present

## 2024-05-04 NOTE — Patient Instructions (Signed)
 Please send a MyChart message with the name of physical therapy facility  Nerve of the left arm  ELECTROMYOGRAM AND NERVE CONDUCTION STUDIES (EMG/NCS) INSTRUCTIONS  How to Prepare The neurologist conducting the EMG will need to know if you have certain medical conditions. Tell the neurologist and other EMG lab personnel if you: Have a pacemaker or any other electrical medical device Take blood-thinning medications Have hemophilia, a blood-clotting disorder that causes prolonged bleeding Bathing Take a shower or bath shortly before your exam in order to remove oils from your skin. Don't apply lotions or creams before the exam.  What to Expect You'll likely be asked to change into a hospital gown for the procedure and lie down on an examination table. The following explanations can help you understand what will happen during the exam.  Electrodes. The neurologist or a technician places surface electrodes at various locations on your skin depending on where you're experiencing symptoms. Or the neurologist may insert needle electrodes at different sites depending on your symptoms.  Sensations. The electrodes will at times transmit a tiny electrical current that you may feel as a twinge or spasm. The needle electrode may cause discomfort or pain that usually ends shortly after the needle is removed. If you are concerned about discomfort or pain, you may want to talk to the neurologist about taking a short break during the exam.  Instructions. During the needle EMG, the neurologist will assess whether there is any spontaneous electrical activity when the muscle is at rest - activity that isn't present in healthy muscle tissue - and the degree of activity when you slightly contract the muscle.  He or she will give you instructions on resting and contracting a muscle at appropriate times. Depending on what muscles and nerves the neurologist is examining, he or she may ask you to change positions during  the exam.  After your EMG You may experience some temporary, minor bruising where the needle electrode was inserted into your muscle. This bruising should fade within several days. If it persists, contact your primary care doctor.

## 2024-05-04 NOTE — Progress Notes (Signed)
 Follow-up Visit   Date: 05/04/2024    Emily Osborne MRN: [id number] DOB: [birth year]    Emily Osborne is a 62 y.o. right-handed African American female with fibromyalgia, chronic headaches, hypertension, hyperlipidemia, vitamin B12 deficiency, and Graves disease returning to the clinic for follow-up of dizziness.  The patient was accompanied to the clinic by self.   IMPRESSION/PLAN: Left C5 radiculopathy, she is completing course of prednisone .  - Start PT   - Nerve testing of the left arm  Cognitive changes with word-finding difficulty, most likely stress-induced, stable.  She is able to complete ADLs and IADLs independently.  At her last visit, she scored 24/30 on MOCA missing points across domains. TSH and vitamin B12 is normal. Discussed neuropsychological testing if symptoms get worse.   History of BPPV, responsive to meclizine , resolved.  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  No LVO.  TEE with positive R > L shunt concerning for small PFO.  Loop recorder placed in December 2023. If there is no arrhythmia on monitoring, PFO closure may be considered in the future.  - Continue plavix  75mg  daily  - Continue secondary risk factor modification.  Continue lipitor 40mg  (LDL 95) and BP management.    Return to clinic in 6 months  --------------------------------------------- UPDATE 07/25/2022: She was admitted on 06/22/2022 with stroke after presenting with left sided weakness/numbness and headache.  MRI brain shows scattered punctate ischemic infarcts bilaterally.  TEE showed normal EF, mild LVH.  CTA head and neck did not show large vessel occlusion or stenosis, there was note of diffuse tortuosity. TEE noted + bubble with R>L shunt.   She will be having loop recorder implanted later this month and if there is no atrial fibrillation present on monitoring, the next step would be PFO closure. She denies any ongoing weakness and feels back to her  baseline.  She continues to have headaches about 1-2 times per month over the top of her head.  She typically treats this with tylenol  and it resolved within a few minutes.    She is a nonsmoker.  No history of diabetes.  She takes meformin for weight loss.    UPDATE 11/28/2022:  She is here for follow-up visit.   She reports having some word-finding difficulty and has some word substitution, which has been ongoing since her stroke.  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  She completed PT but continues to have pain and achiness in the left arm.  She denies weakness.   No new neurological symptoms.   She continues to have headaches a few times per month, which improves with tylenol .   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  UPDATE 04/16/2023:  For the past month, she has been getting spells of dizziness and sensation as if she could pass out.  Symptoms last 20-min. She went to the ER last week because she has associated numbness/tingling of the left face, which lasted 2.5 hour. She had MRI brain which did not show acute stroke.  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   She notices that symptoms are worse if she rotates her head or looks down.  She has occasional nausea and imbalance.  No headaches, vision changes, numbness/tinging, or weakness. She has loop recorder which has not detected any arrhthymias.  UPDATE 06/04/2023:  She has not had any further spells of dizziness since starting meclizine .  Today, she complains of nagging headache at the vertex of the head.  She takes tylenol  which helps within an hour, which she takes about twice per week.    Duration is typically 3-4 hours, if untreated.     UPDATE 10/29/2023:  She sometimes veers to the left upon standing.  No further dizzy spells or falls.  She has noticed that she is losing her train of thought when she is teaching.  She has also found herself to  forget names and has word-finding difficulty.  She is able to eventually remember the word, but takes a little while. She endorses having personal stressors, when became worse in January.  UPDATE 05/04/2024:  She is here for follow-up visit. She went to the ER last week with left arm tingling and was found to have left C5 radiculitis.  She started prednisone  which has helped.  She continues to have spells of word-finding difficulty.  She continues to have a lot of personal stressors.  She is able to manage finances, medications, and continues to teach.    Medications:  Current Outpatient Medications on File Prior to Visit  Medication Sig Dispense Refill   acetaminophen  (TYLENOL ) 650 MG CR tablet Take 650 mg by mouth as needed for pain.     albuterol  (VENTOLIN  HFA) 108 (90 Base) MCG/ACT inhaler Inhale 2 puffs into the lungs every 4 (four) hours as needed for wheezing or shortness of breath. 18 g 2   ALPRAZolam  (XANAX ) 0.25 MG tablet Take 1 tablet (0.25 mg total) by mouth 3 (three) times daily as needed. 30 tablet 2   atorvastatin  (LIPITOR) 40 MG tablet Take 1 tablet (40 mg) by mouth daily. 90 tablet 1   Blood Glucose Monitoring Suppl (FREESTYLE FREEDOM) KIT 1 Device by Does not apply route daily. 1 each 0   budesonide -formoterol  (SYMBICORT ) 80-4.5 MCG/ACT inhaler Inhale 2 puffs into the lungs 2 (two) times daily, in the morning and 12 hours later. 10.2 g 0   cholecalciferol  (VITAMIN D3) 25 MCG (1000 UNIT) tablet 2,000 units 6 days weekly.     clopidogrel  (PLAVIX ) 75 MG tablet Take 1 tablet (75 mg total) by mouth daily. 90 tablet 3   diclofenac  sodium (VOLTAREN ) 1 % GEL Apply 2 g topically 4 (four) times daily. Rub into affected area of foot 2 to 4 times daily 100 g 2   EPINEPHrine  (EPIPEN  2-PAK) 0.3 mg/0.3 mL IJ SOAJ injection Inject 0.3 mg into the muscle as needed for anaphylaxis. 2 each 2   famotidine  (PEPCID ) 20 MG tablet Take 1 tablet (20 mg) by mouth daily after supper 30 tablet 11    fluticasone  (FLONASE ) 50 MCG/ACT nasal spray PLACE 2 SPRAYS INTO BOTH NOSTRILS DAILY. 16 g 5   furosemide  (LASIX ) 40 MG tablet Take 1 tablet (40 mg total) by mouth daily. 90 tablet 3   glucose blood (FREESTYLE LITE) test strip USE TO CHECK BLOOD SUGAR ONCE A DAY 100 each 1   levocetirizine (XYZAL ) 5 MG tablet Take 1 tablet (5 mg total) by mouth every evening. 90 tablet 1   lidocaine  (LIDODERM ) 5 % Apply to the affected area once daily as needed for a week. **Remove patch after 12 hours** 30 patch 0   meclizine  (ANTIVERT ) 12.5 MG tablet Take 1 tablet (12.5 mg total) by mouth 2 (two) times daily as needed for dizziness. 30 tablet 1   methimazole  (TAPAZOLE ) 5 MG tablet Take 1 tablet (5 mg total) by mouth 3 (three) days a week (M,W,F) 45 tablet  3   pantoprazole  (PROTONIX ) 40 MG tablet Take one tablet by mouth one to two times daily. PLEASE keep your upcoming September appointment for more refills. Thank you 60 tablet 1   predniSONE  (DELTASONE ) 10 MG tablet Take 3 tablets (30 mg total) daily for 3 days, THEN 2 tablets (20 mg total) daily for 3 days, THEN 1 tablet (10 mg total) daily for 3 days, THEN 0.5 tablets (5 mg total) daily for 3 days. 20 tablet 0   Probiotic Product (ALIGN) 4 MG CAPS Take 1 capsule (4 mg total) by mouth daily. 90 capsule 3   promethazine -dextromethorphan (PROMETHAZINE -DM) 6.25-15 MG/5ML syrup Take 5 mLs by mouth 4 (four) times daily as needed. 118 mL 0   sucralfate  (CARAFATE ) 1 g tablet Take 1 tablet by mouth every 6 hours as needed. 60 tablet 3   triamcinolone  ointment (KENALOG ) 0.1 % Apply as directed to affected area twice a day 454 g 3   valACYclovir  (VALTREX ) 1000 MG tablet Take 1 tablet (1,000 mg total) by mouth 3 (three) times daily as needed. 30 tablet 2   Vitamin D , Ergocalciferol , (DRISDOL ) 1.25 MG (50000 UNIT) CAPS capsule Take 1 capsule (50,000 Units total) by mouth every 7 (seven) days. 12 capsule 1   methylPREDNISolone  (MEDROL  DOSEPAK) 4 MG TBPK tablet See dosepak  instructions (Patient not taking: Reported on 05/04/2024) 21 each 0   No current facility-administered medications on file prior to visit.    Allergies:  Allergies  Allergen Reactions   Aspirin Anaphylaxis   Gentamicin Other (See Comments)    Eye drops turned the sclera bright red   Ibuprofen Anaphylaxis   Metronidazole Swelling and Other (See Comments)    Red face/swelling   Nsaids Anaphylaxis   Augmentin  [Amoxicillin -Pot Clavulanate]    Doxycycline  Nausea And Vomiting and Other (See Comments)    Severe nausea/vomiting   Influenza A (H1n1) Mon[place] Vac Other (See Comments)    Sick for 3 weeks   Loratadine  Other (See Comments)    Fatigue/weakness   Metformin  And Related Other (See Comments)    Memory issues and headache   Periactin [Cyproheptadine] Other (See Comments)    Paranoid    Ranitidine Hcl Other (See Comments)    Lips turned red /peel   Sulfamethoxazole-Trimethoprim Other (See Comments)    Face red/peel   Tramadol Hcl Other (See Comments)    Paranoid    Vital Signs:  There were no vitals taken for this visit.  Neurological Exam: MENTAL STATUS including orientation to time, place, person, recent and remote memory, attention span and concentration, language, and fund of knowledge is normal.  Speech is not dysarthric.     10/29/2023   11:02 AM 02/06/2022   12:00 PM  Montreal Cognitive Assessment   Visuospatial/ Executive (0/5) 3 4  Naming (0/3) 3 3  Attention: Read list of digits (0/2) 2 2  Attention: Read list of letters (0/1) 1 1  Attention: Serial 7 subtraction starting at 100 (0/3) 3 3  Language: Repeat phrase (0/2) 2 2  Language : Fluency (0/1) 1 1  Abstraction (0/2) 1 2  Delayed Recall (0/5) 2 3  Orientation (0/6) 6 6  Total 24 27  Adjusted Score (based on education)  27   CRANIAL NERVES:   Pupils equal round and reactive to light.  Normal conjugate, extra-ocular eye movements in all directions of gaze.  No nystagmus. No ptosis.  Face is symmetric.    MOTOR:  Motor strength is 5/5 in all extremities.  No  atrophy, fasciculations or abnormal movements.  No pronator drift.  Tone is normal.    MSRs:  Reflexes are 2+/4 throughout.  SENSORY:  Intact to vibration throughout.  COORDINATION/GAIT:  Normal finger-to- nose-finger.   Gait is normal and stable.   Data: Lab Results  Component Value Date   HGBA1C 6.2 03/16/2024   Lab Results  Component Value Date   CHOL 144 03/16/2024   HDL 44.90 03/16/2024   LDLCALC 66 03/16/2024   LDLDIRECT 139.4 12/26/2011   TRIG 163.0 (H) 03/16/2024   CHOLHDL 3 03/16/2024   Lab Results  Component Value Date   TSH 0.98 03/16/2024   Lab Results  Component Value Date   VITAMINB12 >1501 (H) 04/29/2023   CT cervical spine 04/29/2024: 1. No acute osseous abnormality in the cervical spine. 2. Chronic left side C4-C5 facet and disc osteophyte degeneration with subsequent severe Left neural foraminal stenosis. Query left C5 radiculitis.  MRI brain wo contrast 06/22/2022: 1. Few punctate acute ischemic infarcts involving the cortical gray matter of both cerebral hemispheres, presumably embolic. No associated hemorrhage or mass effect. 2. Underlying mild chronic microvascular ischemic disease, stable.  CTA head and neck 06/22/2022: Negative CTA of the head and neck. No large vessel occlusion or other emergent finding. No hemodynamically significant or correctable stenosis. Diffuse tortuosity of the major arterial vasculature of the head and neck, suggesting chronic underlying hypertension.   TEE 07/09/2022: 1. Left ventricular ejection fraction, by estimation, is 55 to 60%. The left ventricle has normal function. The left ventricle has no regional  all motion abnormalities.   2. Right ventricular systolic function is normal. The right ventricular size is normal.   3. No left atrial/left atrial appendage thrombus was detected. The LAA emptying velocity was 79 cm/s.   4. The mitral valve is normal in  structure. Mild mitral valve regurgitation. No evidence of mitral stenosis.   5. The aortic valve is tricuspid. Aortic valve regurgitation is not visualized. No aortic stenosis is present.   6. Agitated saline contrast bubble study was positive with shunting observed within 3-6 cardiac cycles suggestive of interatrial shunt. There is a small patent foramen ovale with predominantly right to left shunting  across the atrial septum.   MRI brain wo contrast 04/10/2023: 1. No evidence of an acute intracranial abnormality. 2. Cerebellar atrophy, chronic small vessel ischemic disease and chronic infarcts, as described.  MRA head and neck 04/10/2023: 1. The common carotid and internal carotid arteries are patent within the neck without stenosis. 2. The vertebral arteries are patent within the neck. Apparent severe stenosis at the right vertebral artery origin. However, this apparent stenosis could potentially be exaggerated by vessel tortuosity (no stenosis was present at this site on the prior CTA head/neck of 06/22/2022).   Thank you for allowing me to participate in patient's care.  If I can answer any additional questions, I would be pleased to do so.    Sincerely,    Astrid Vides K. Tobie, DO

## 2024-05-05 ENCOUNTER — Ambulatory Visit (HOSPITAL_BASED_OUTPATIENT_CLINIC_OR_DEPARTMENT_OTHER): Admitting: Cardiology

## 2024-05-05 VITALS — BP 130/80 | HR 76 | Resp 17 | Ht 65.0 in | Wt 253.0 lb

## 2024-05-05 DIAGNOSIS — Z7189 Other specified counseling: Secondary | ICD-10-CM

## 2024-05-05 DIAGNOSIS — E785 Hyperlipidemia, unspecified: Secondary | ICD-10-CM

## 2024-05-05 DIAGNOSIS — Z8673 Personal history of transient ischemic attack (TIA), and cerebral infarction without residual deficits: Secondary | ICD-10-CM

## 2024-05-05 DIAGNOSIS — I2510 Atherosclerotic heart disease of native coronary artery without angina pectoris: Secondary | ICD-10-CM

## 2024-05-05 DIAGNOSIS — E66813 Obesity, class 3: Secondary | ICD-10-CM | POA: Diagnosis not present

## 2024-05-05 DIAGNOSIS — Z6841 Body Mass Index (BMI) 40.0 and over, adult: Secondary | ICD-10-CM

## 2024-05-05 NOTE — Progress Notes (Signed)
 Cardiology Office Note:  .   Date:  05/05/2024  ID:  Emily, Osborne Jul 26, 1961, MRN [id number] PCP: Emily Osborne, Emily SAUNDERS, DO  [facility] Providers Cardiologist:  Shelda Bruckner, MD {  History of Present Illness: .   Emily Osborne is a 63 y.o. female with PMH CVA 2023, hyperthyroidism s/p methimazole , nonobstructive CAD by CT.  Pertinent CV history: Acute CVA 06/2022, had TEE and loop monitor as outpatient. TEE with small PFO but not recommended for closure at the immediate time, neurology states that it could be considered in the future per notes. Previously had cath in 2018 with no CAD.  Today: Last seen 01/2024 for chest pain. Had coronary CT and echo done. Coronary CT 02/2024 with Ca score 0, mild noncalcified plaque in proximal D1 and OM1 without significant stenosis. Echo 03/2024 with EF 55-60%, G1DD, no significant valve disease, normal RA/RV pressures.  Had pain in her left arm last week, went to the ER, now on prednisone  for cervical radiculopathy. Has had intermittent issues with her asthma, better with inhaler.  She is working on diet changes for weight loss. She did a trial of Wegovy  and did well, but she could not continue after the month because her insurance didn't cover it. We discussed clinical trail.  ROS: Denies chest pain, shortness of breath at rest or with normal exertion. No PND, orthopnea, LE edema or unexpected weight gain. No syncope or palpitations. ROS otherwise negative except as noted.   Studies Reviewed: SABRA    EKG:       Physical Exam:   VS:  BP 130/80 (BP Location: Right Arm, Patient Position: Sitting, Cuff Size: Large)   Pulse 76   Resp 17   Ht 5' 5 (1.651 m)   Wt 253 lb (114.8 kg)   SpO2 97%   BMI 42.10 kg/m    Wt Readings from Last 3 Encounters:  05/05/24 253 lb (114.8 kg)  05/04/24 255 lb (115.7 kg)  04/29/24 250 lb (113.4 kg)    GEN: Well nourished, well developed in no acute distress HEENT: Normal, moist mucous  membranes NECK: No JVD CARDIAC: regular rhythm, normal S1 and S2, no rubs or gallops. 1/6 systolic murmur. VASCULAR: Radial and DP pulses 2+ bilaterally. No carotid bruits RESPIRATORY:  Clear to auscultation without rales, wheezing or rhonchi  ABDOMEN: Soft, non-tender, non-distended MUSCULOSKELETAL:  Ambulates independently SKIN: Warm and dry, no edema NEUROLOGIC:  Alert and oriented x 3. No focal neuro deficits noted. PSYCHIATRIC:  Normal affect    ASSESSMENT AND PLAN: .    CVA 06/2022 -had TEE, loop as part of workup. TEE with small PFO; most recent note from Dr. Tobie in neurology from 10/2023 stated that PFO closure could be considered in the future. Loop has not shown afib -continue clopidogrel , atorvastatin   Minimal nonobstructive CAD on coronary CT Hypercholesterolemia -on clopidogrel , atorvastatin  as above  Obesity, class 3, BMI 42 -with prior CVA, she would be a good candidate for GLP -not covered on her insurance from prevention standpoint, discussed clinical trial with GLP coming up, she is interested -working on lifestyle change  CV risk counseling and prevention -recommend heart healthy/Mediterranean diet, with whole grains, fruits, vegetable, fish, lean meats, nuts, and olive oil. Limit salt. -recommend moderate walking, 3-5 times/week for 30-50 minutes each session. Aim for at least 150 minutes/week. Goal should be pace of 3 miles/hours, or walking 1.5 miles in 30 minutes -recommend avoidance of tobacco products. Avoid excess alcohol.  Dispo: 1  year or sooner as needed  Signed, Shelda Bruckner, MD   Shelda Bruckner, MD, PhD, [facility]  [facility]  [place]  [facility] [address] [place], [place] [address] [phone]

## 2024-05-05 NOTE — Patient Instructions (Addendum)
 Medication Instructions:  Your physician recommends that you continue on your current medications as directed. Please refer to the Current Medication list given to you today.  *If you need a refill on your cardiac medications before your next appointment, please call your pharmacy*  Lab Work: NONE  Testing/Procedures: NONE  Follow-Up: At [facility], you and your health needs are our priority.  As part of our continuing mission to provide you with exceptional heart care, we have created designated Provider Care Teams.  These Care Teams include your primary Cardiologist (physician) and Advanced Practice Providers (APPs -  Physician Assistants and Nurse Practitioners) who all work together to provide you with the care you need, when you need it.  We recommend signing up for the patient portal called MyChart.  Sign up information is provided on this After Visit Summary.  MyChart is used to connect with patients for Virtual Visits (Telemedicine).  Patients are able to view lab/test results, encounter notes, upcoming appointments, etc.  Non-urgent messages can be sent to your provider as well.   To learn more about what you can do with MyChart, go to [email or web address].    Your next appointment:   12 month(s)  The format for your next appointment:   In Person  Provider:   Dr Lonni, Reche ORN NP, or Rosaline RAMAN NP

## 2024-05-07 ENCOUNTER — Encounter: Payer: Self-pay | Admitting: Family Medicine

## 2024-05-11 ENCOUNTER — Ambulatory Visit (INDEPENDENT_AMBULATORY_CARE_PROVIDER_SITE_OTHER): Admitting: Gastroenterology

## 2024-05-11 ENCOUNTER — Encounter: Payer: Self-pay | Admitting: Gastroenterology

## 2024-05-11 ENCOUNTER — Telehealth: Payer: Self-pay

## 2024-05-11 ENCOUNTER — Other Ambulatory Visit (HOSPITAL_COMMUNITY): Payer: Self-pay

## 2024-05-11 VITALS — BP 114/80 | HR 61 | Ht 65.0 in | Wt 250.5 lb

## 2024-05-11 DIAGNOSIS — R143 Flatulence: Secondary | ICD-10-CM | POA: Diagnosis not present

## 2024-05-11 DIAGNOSIS — Z79899 Other long term (current) drug therapy: Secondary | ICD-10-CM

## 2024-05-11 DIAGNOSIS — Z860101 Personal history of adenomatous and serrated colon polyps: Secondary | ICD-10-CM

## 2024-05-11 DIAGNOSIS — R141 Gas pain: Secondary | ICD-10-CM

## 2024-05-11 DIAGNOSIS — R142 Eructation: Secondary | ICD-10-CM

## 2024-05-11 DIAGNOSIS — Z7902 Long term (current) use of antithrombotics/antiplatelets: Secondary | ICD-10-CM

## 2024-05-11 DIAGNOSIS — Z860100 Personal history of colon polyps, unspecified: Secondary | ICD-10-CM

## 2024-05-11 DIAGNOSIS — K219 Gastro-esophageal reflux disease without esophagitis: Secondary | ICD-10-CM | POA: Diagnosis not present

## 2024-05-11 DIAGNOSIS — K760 Fatty (change of) liver, not elsewhere classified: Secondary | ICD-10-CM

## 2024-05-11 MED ORDER — AMBULATORY NON FORMULARY MEDICATION: Status: AC

## 2024-05-11 MED ORDER — PANTOPRAZOLE SODIUM 40 MG PO TBEC
40.0000 mg | DELAYED_RELEASE_TABLET | Freq: Every day | ORAL | 3 refills | Status: AC
  Filled 2024-05-11 – 2024-05-20 (×2): qty 90, 90d supply, fill #0
  Filled 2024-06-25: qty 90, 90d supply, fill #1

## 2024-05-11 MED ORDER — NA SULFATE-K SULFATE-MG SULF 17.5-3.13-1.6 GM/177ML PO SOLN
1.0000 | Freq: Once | ORAL | 0 refills | Status: AC
  Filled 2024-05-11: qty 354, 1d supply, fill #0

## 2024-05-11 NOTE — Telephone Encounter (Signed)
   Patient Name: Emily Osborne  DOB: 02/12/1961 MRN: [id number]  Primary Cardiologist: Shelda Bruckner, MD  Chart reviewed as part of pre-operative protocol coverage. Patient has upcoming colonoscopy planned. Cardiology was asked to provide recommendation for holding Plavix . Patient is on Plavix  for prior CVA. Therefore, will defer recommendation for holding this to Neurology or PCP.   Please call with questions.  Jeriah Corkum E [name] [name], PA-C 05/11/2024, 2:16 PM

## 2024-05-11 NOTE — Telephone Encounter (Signed)
 [facility] Pre-operative Risk Assessment     Request for surgical clearance:     Endoscopy Procedure  What type of surgery is being performed?     COLONOSCOPY  When is this surgery scheduled?     06-04-24  What type of clearance is required ?   Pharmacy  Are there any medications that need to be held prior to surgery and how long? PLAVIX  5 DAYS  Practice name and name of physician performing surgery?      [facility] DR ELSPETH NAVAL  What is your office phone and fax number?      Phone- [phone]  Fax- [phone]  Anesthesia type (None, local, MAC, general) ?       MAC   Please route your response to Camyra Vaeth, CMA  Thank you

## 2024-05-11 NOTE — Patient Instructions (Signed)
 You have been scheduled for a colonoscopy. Please follow written instructions given to you at your visit today.   If you use inhalers (even only as needed), please bring them with you on the day of your procedure.  DO NOT TAKE 7 DAYS PRIOR TO TEST- Trulicity (dulaglutide) Ozempic , Wegovy  (semaglutide ) Mounjaro  (tirzepatide ) Bydureon Bcise (exanatide extended release)  DO NOT TAKE 1 DAY PRIOR TO YOUR TEST Rybelsus  (semaglutide ) Adlyxin (lixisenatide) Victoza (liraglutide) Byetta (exanatide) ___________________________________________________________________________  Rosine will be contacted by our office prior to your procedure for directions on holding your Plavix .  If you do not hear from our office 1 week prior to your scheduled procedure, please call [phone] to discuss.   We are giving you a Low-FODMAP diet handout today. FODMAPs are short-chain carbohydrates (sugars) that are highly fermentable, which means that they go through chemical changes in the GI system, and are poorly absorbed during digestion. When FODMAPs reach the colon (large intestine), bacteria ferment these sugars, turning them into gas and chemicals. This stretches the walls of the colon, causing abdominal bloating, distension, cramping, pain, and/or changes in bowel habits in many patients with IBS.  FODMAPs are not unhealthy or harmful, but may exacerbate GI symptoms in those with sensitive GI tracts.     We have given you samples of the following medication to take:  FODZYMES  We have sent the following medications to your pharmacy for you to pick up at your convenience: Protonix  40mg : Take once daily  Thank you for entrusting me with your care and for choosing [facility], Dr. Elspeth Naval    _______________________________________________________  If your blood pressure at your visit was 140/90 or greater, please contact your primary care physician to follow up on  this.  _______________________________________________________  If you are age 64 or older, your body mass index should be between 23-30. Your Body mass index is 41.69 kg/m. If this is out of the aforementioned range listed, please consider follow up with your Primary Care Provider.  If you are age 66 or younger, your body mass index should be between 19-25. Your Body mass index is 41.69 kg/m. If this is out of the aformentioned range listed, please consider follow up with your Primary Care Provider.   ________________________________________________________  The  GI providers would like to encourage you to use MYCHART to communicate with providers for non-urgent requests or questions.  Due to long hold times on the telephone, sending your provider a message by [facility] may be a faster and more efficient way to get a response.  Please allow 48 business hours for a response.  Please remember that this is for non-urgent requests.  _______________________________________________________  [facility] is using a team-based approach to care.  Your team is made up of your doctor and two to three APPS. Our APPS (Nurse Practitioners and Physician Assistants) work with your physician to ensure care continuity for you. They are fully qualified to address your health concerns and develop a treatment plan. They communicate directly with your gastroenterologist to care for you. Seeing the Advanced Practice Practitioners on your physician's team can help you by facilitating care more promptly, often allowing for earlier appointments, access to diagnostic testing, procedures, and other specialty referrals.

## 2024-05-11 NOTE — Telephone Encounter (Signed)
 Request sent to Neurology.  Please disregard

## 2024-05-11 NOTE — Telephone Encounter (Signed)
 Called and LM for patient to call back to discuss.

## 2024-05-11 NOTE — Telephone Encounter (Signed)
  Emily Osborne July 24, 1961 [id number]  05/11/24   Dear Dr. Tobie:  We have scheduled the above named patient for a(n) colonoscopy procedure. Our records show that (s)he is on anticoagulation therapy.  Please advise as to whether the patient may come off their therapy of Plavix  5 days prior to their procedure which is scheduled for 06-04-24.  Please route your response to Clarita Favre, CMA or fax response to [phone].  Sincerely,    [facility]

## 2024-05-11 NOTE — Progress Notes (Signed)
 HPI :  63 year old female known to me, history of GERD, H. pylori, C. difficile, fatty liver, colon polyps, here for reassessment of these issues.  She was last seen in June 2024.  Recall she had a colonoscopy in February 2017 with 7 adenomatous/sessile serrated polyps removed.  She had a follow-up exam in 2020 with 3 smaller polyps removed.  I had recommended a repeat exam in 5 years after that last exam so she is due for colonoscopy now.  She denies any problems with her bowels currently.  She denies any constipation.  She does have a lot of bloating and gas that can bother her, she thinks often that dairy can precipitate it.  She reminds me she takes Plavix  for history of TIA/CVA.  She has had prior endoscopies, the last was in 2020 which showed no evidence of H. pylori or Barrett's.  She has been taking some align for her bloating and states she thinks it helps when she takes it.  She takes Protonix  40 mg every morning and Pepcid  nightly and that controls her reflux symptoms pretty well currently.  Recall she has been on a variety of PPIs in the past with variable response, including Dexilant .  More recently she has been feeling pretty well on Protonix  and states it works well at the current dosing.  She has no history of osteoporosis (prior DEXA scan was normal) or CKD.  She does have a history of hepatic steatosis noted on imaging.  Her liver enzymes have been okay.  She drinks coffee routinely.  She denies any alcohol use.  She has struggled losing weight.  Body mass index is 41.69 and weighs 250 pounds.  She was on Wegovy  for 1 month for weight but then insurance stopped covering it so she stopped it.  She has been seen by her cardiologist, they are trying to get her on a trial for GLP-1 agonists and she states she hopes to be starting this again at some point soon.  She recently had an echo in August which otherwise looked okay, EF is normal    Endoscopic history: EGD 04/2008 - normal  esophagus, H pylori gastriits, normal duodenum Colonoscopy in 2012 with a 3mm descending colon polyp removed but not retrieved. Colonoscopy  09/30/2015 - seven small colon polyps - adenomas, sessile serrated, recall in 3 years, pancolonic diverticulosis, and hemorrhoids EGD 05/17/16 - normal esophagus, benign gastric polyps, gastritis, normal duodenum. Biopsies show no celiac, gastric polyps benign, H pylori gastritis noted EGD 06/04/2017 - gastritis, biposies taken, prominent ampulla - 2cm HH, otherwise normal esophagus - H pylori attempted to be cultured and could not. She followed up with ID   Colonoscopy 10/16/18 - One 3 mm polyp in the sigmoid colon, removed with a cold snare. Resected and retrieved. - One diminutive polyp at the recto-sigmoid colon, removed with a cold snare. Resected and retrieved. - One 3 mm polyp in the rectum, removed with a cold snare. Resected and retrieved. - Diverticulosis in the entire examined colon. - End-to-end colo-colonic anastomosis, characterized by healthy appearing mucosa. - The examination was otherwise normal.   EGD 10/16/18 - 1 cm hiatal hernia. - Normal esophagus - Erythematous mucosa in the gastric fundus and gastric body. - Normal stomach otherwise - biopsies taken to rule out H pylori and negative - Normal duodenal bulb and second portion of the duodenum.   1. Surgical [P], gastric antrum and body - MILD CHRONIC GASTRITIS WITHOUT ACTIVITY - NO H. PYLORI OR INTESTINAL METAPLASIA  IDENTIFIED - SEE COMMENT 2. Surgical [P], colon, rectosigmoid, sigmoid, rectal, polyp (3) - HYPERPLASTIC POLYP (1 OF 3 FRAGMENTS) - POLYPOID FRAGMENTS OF COLONIC MUCOSA WITH LYMPHOID AGGREGATE(S)(2 OF 3 FRAGMENTS) - NO HIGH GRADE DYSPLASIA OR MALIGNANCY IDENTIFIED 1. A Warthin-Starry stain is performed to determine the possibility of the presence of Helicobacter pylori. The Warthin-Starry stain is negative for organisms morphologically consistent with Helicobacter  pylori.   Repeat colonoscopy in 5 years   Echo 12/30/18 - EF 55-60%     CT abdomen/ pelvis 02/15/21 - IMPRESSION: Small fat containing periumbilical hernia. Hepatic steatosis. No other abnormality seen in the abdomen or pelvis.      Echo 03/30/24: IMPRESSIONS   1. Left ventricular ejection fraction, by estimation, is 55 to 60%. The  left ventricle has normal function. The left ventricle has no regional  wall motion abnormalities. Left ventricular diastolic parameters are  consistent with Grade I diastolic  dysfunction (impaired relaxation).   2. Right ventricular systolic function is normal. The right ventricular  size is normal.   3. The mitral valve is normal in structure. Trivial mitral valve  regurgitation. No evidence of mitral stenosis.   4. The aortic valve is tricuspid. Aortic valve regurgitation is not  visualized. Aortic valve sclerosis is present, with no evidence of aortic  valve stenosis.   5. The inferior vena cava is normal in size with greater than 50%  respiratory variability, suggesting right atrial pressure of 3 mmHg.        Past Medical History:  Diagnosis Date   Allergy    Anxiety    Asthma    pt has inhaler   Back pain    C. difficile colitis    CAD (coronary artery disease) 2025   Cataract    bil eyes   Cerebrovascular accident (CVA) (HCC) 06/29/2022   Chest pain    Chronic fatigue syndrome    Clostridium difficile colitis 12/13/2016   Diverticulitis    Dyspnea    Dysrhythmia    palpitations   Edema    Elevated blood pressure reading without diagnosis of hypertension    just elevated when I'm in pain (12/07/2015)   Fatty liver    Fibromyalgia    Ganglion of joint    right wrist   GERD (gastroesophageal reflux disease)    Graves disease 01/02/2016   H. pylori infection    Headache    hx of   Helicobacter pylori (H. pylori) infection 12/13/2016   Herpes zoster without mention of complication    HTN (hypertension)    Hyperglycemia  08/07/2018   Hyperlipidemia    Hyperthyroidism    IBS (irritable bowel syndrome)    Leg edema    Multiple drug allergies 06/18/2017   Myalgia    Nontraumatic rupture of Achilles tendon    Other acute reactions to stress    Pain in joint, shoulder region    Pain in joint, upper arm    Pain in limb    Palpitations    Personal history of other diseases of digestive system    Pneumonia    once   PONV (postoperative nausea and vomiting)    Primary osteoarthritis of both hands 01/07/2024   Clinical findings were suggestive of osteoarthritis.  His x-rays were suggestive of osteoarthritis.  Anti-CCP 28 weak positive.     Primary osteoarthritis of both knees 01/07/2024   Routine screening for STI (sexually transmitted infection) 12/13/2016   S/P partial colectomy 06/18/2017   Thyroid  disease  hyperthyroidism   Vitamin B 12 deficiency      Past Surgical History:  Procedure Laterality Date   ABDOMINAL HYSTERECTOMY     ACHILLES TENDON REPAIR Right 2005   BUBBLE STUDY  07/09/2022   Procedure: BUBBLE STUDY;  Surgeon: Lonni Slain, MD;  Location: [facility] ENDOSCOPY;  Service: Cardiovascular;;   BUNIONECTOMY Bilateral    Bunionectomy 1983   COLON SURGERY  01/23/2017   6 to 8 inches sigmoid colon removed   COLONOSCOPY     GANGLION CYST EXCISION Right    wrist   LEFT HEART CATH AND CORONARY ANGIOGRAPHY N/A 10/03/2016   Procedure: Left Heart Cath and Coronary Angiography;  Surgeon: Lonni JONETTA Cash, MD;  Location: [facility] INVASIVE CV LAB;  Service: Cardiovascular;  Laterality: N/A;   LOOP RECORDER IMPLANT  07/2022   MENISCUS REPAIR Right 04/2014   POLYPECTOMY     TEE WITHOUT CARDIOVERSION N/A 07/09/2022   Procedure: TRANSESOPHAGEAL ECHOCARDIOGRAM (TEE);  Surgeon: Lonni Slain, MD;  Location: [facility] ENDOSCOPY;  Service: Cardiovascular;  Laterality: N/A;   TUBAL LIGATION     wisdomteeth extraction     Family History  Problem Relation Age of Onset   Hyperlipidemia Mother     Hypertension Mother        56, 87   Thyroid  disease Mother        Thyroid  surgery   Obesity Mother    Heart disease Mother    Hypertension Father    Diabetes Father    Heart disease Father    Kidney disease Father        Died, 16   Hyperlipidemia Father    Pernicious anemia Sister    Thyroid  disease Sister        On thyroid  Rx   Atrial fibrillation Sister    Other Sister        hypoglycemia   Leukemia Brother    Cancer Brother        myleoblastic anemia   Diabetes Brother    Coronary artery disease Brother    Hypertension Brother    Aplastic anemia Daughter    Hypertension Daughter    Migraines Daughter    GER disease Daughter    Other Daughter        pericarditis   Bipolar disorder Daughter    Schizophrenia Daughter    Colon cancer Neg Hx    Esophageal cancer Neg Hx    Rectal cancer Neg Hx    Stomach cancer Neg Hx    Pancreatic cancer Neg Hx    Prostate cancer Neg Hx    Colon polyps Neg Hx    Social History   Tobacco Use   Smoking status: Never    Passive exposure: Past   Smokeless tobacco: Never  Vaping Use   Vaping status: Never Used  Substance Use Topics   Alcohol use: No    Alcohol/week: 0.0 standard drinks of alcohol   Drug use: No   Current Outpatient Medications  Medication Sig Dispense Refill   acetaminophen  (TYLENOL ) 650 MG CR tablet Take 650 mg by mouth as needed for pain.     albuterol  (VENTOLIN  HFA) 108 (90 Base) MCG/ACT inhaler Inhale 2 puffs into the lungs every 4 (four) hours as needed for wheezing or shortness of breath. 18 g 2   ALPRAZolam  (XANAX ) 0.25 MG tablet Take 1 tablet (0.25 mg total) by mouth 3 (three) times daily as needed. 30 tablet 2   atorvastatin  (LIPITOR) 40 MG tablet Take 1 tablet (40 mg) by mouth  daily. 90 tablet 1   Blood Glucose Monitoring Suppl (FREESTYLE FREEDOM) KIT 1 Device by Does not apply route daily. 1 each 0   budesonide -formoterol  (SYMBICORT ) 80-4.5 MCG/ACT inhaler Inhale 2 puffs into the lungs 2 (two)  times daily, in the morning and 12 hours later. 10.2 g 0   cholecalciferol  (VITAMIN D3) 25 MCG (1000 UNIT) tablet 2,000 units 6 days weekly.     clopidogrel  (PLAVIX ) 75 MG tablet Take 1 tablet (75 mg total) by mouth daily. 90 tablet 3   diclofenac  sodium (VOLTAREN ) 1 % GEL Apply 2 g topically 4 (four) times daily. Rub into affected area of foot 2 to 4 times daily 100 g 2   EPINEPHrine  (EPIPEN  2-PAK) 0.3 mg/0.3 mL IJ SOAJ injection Inject 0.3 mg into the muscle as needed for anaphylaxis. 2 each 2   famotidine  (PEPCID ) 20 MG tablet Take 1 tablet (20 mg) by mouth daily after supper 30 tablet 11   fluticasone  (FLONASE ) 50 MCG/ACT nasal spray PLACE 2 SPRAYS INTO BOTH NOSTRILS DAILY. 16 g 5   furosemide  (LASIX ) 40 MG tablet Take 1 tablet (40 mg total) by mouth daily. 90 tablet 3   glucose blood (FREESTYLE LITE) test strip USE TO CHECK BLOOD SUGAR ONCE A DAY 100 each 1   levocetirizine (XYZAL ) 5 MG tablet Take 1 tablet (5 mg total) by mouth every evening. 90 tablet 1   lidocaine  (LIDODERM ) 5 % Apply to the affected area once daily as needed for a week. **Remove patch after 12 hours** 30 patch 0   meclizine  (ANTIVERT ) 12.5 MG tablet Take 1 tablet (12.5 mg total) by mouth 2 (two) times daily as needed for dizziness. 30 tablet 1   methimazole  (TAPAZOLE ) 5 MG tablet Take 1 tablet (5 mg total) by mouth 3 (three) days a week (M,W,F) 45 tablet 3   pantoprazole  (PROTONIX ) 40 MG tablet Take one tablet by mouth one to two times daily. PLEASE keep your upcoming September appointment for more refills. Thank you 60 tablet 1   predniSONE  (DELTASONE ) 10 MG tablet Take 3 tablets (30 mg total) daily for 3 days, THEN 2 tablets (20 mg total) daily for 3 days, THEN 1 tablet (10 mg total) daily for 3 days, THEN 0.5 tablets (5 mg total) daily for 3 days. 20 tablet 0   Probiotic Product (ALIGN) 4 MG CAPS Take 1 capsule (4 mg total) by mouth daily. 90 capsule 3   promethazine -dextromethorphan (PROMETHAZINE -DM) 6.25-15 MG/5ML syrup  Take 5 mLs by mouth 4 (four) times daily as needed. 118 mL 0   sucralfate  (CARAFATE ) 1 g tablet Take 1 tablet by mouth every 6 hours as needed. 60 tablet 3   triamcinolone  ointment (KENALOG ) 0.1 % Apply as directed to affected area twice a day 454 g 3   valACYclovir  (VALTREX ) 1000 MG tablet Take 1 tablet (1,000 mg total) by mouth 3 (three) times daily as needed. 30 tablet 2   Vitamin D , Ergocalciferol , (DRISDOL ) 1.25 MG (50000 UNIT) CAPS capsule Take 1 capsule (50,000 Units total) by mouth every 7 (seven) days. 12 capsule 1   No current facility-administered medications for this visit.   Allergies  Allergen Reactions   Aspirin Anaphylaxis   Gentamicin Other (See Comments)    Eye drops turned the sclera bright red   Ibuprofen Anaphylaxis   Metronidazole Swelling and Other (See Comments)    Red face/swelling   Nsaids Anaphylaxis   Augmentin  [Amoxicillin -Pot Clavulanate]    Doxycycline  Nausea And Vomiting and Other (See Comments)  Severe nausea/vomiting   Influenza A (H1n1) Monoval Vac Other (See Comments)    Sick for 3 weeks   Loratadine  Other (See Comments)    Fatigue/weakness   Metformin  And Related Other (See Comments)    Memory issues and headache   Periactin [Cyproheptadine] Other (See Comments)    Paranoid    Ranitidine Hcl Other (See Comments)    Lips turned red /peel   Sulfamethoxazole-Trimethoprim Other (See Comments)    Face red/peel   Tramadol Hcl Other (See Comments)    Paranoid     Review of Systems: All systems reviewed and negative except where noted in HPI.   Lab Results  Component Value Date   WBC 4.5 04/29/2024   HGB 13.5 04/29/2024   HCT 40.5 04/29/2024   MCV 86.4 04/29/2024   PLT 253 04/29/2024    Lab Results  Component Value Date   NA 141 04/29/2024   CL 106 04/29/2024   K 4.3 04/29/2024   CO2 23 04/29/2024   BUN 13 04/29/2024   CREATININE 0.97 04/29/2024   GFRNONAA >60 04/29/2024   CALCIUM  9.5 04/29/2024   ALBUMIN 4.4 04/29/2024    GLUCOSE 111 (H) 04/29/2024    Lab Results  Component Value Date   ALT 26 04/29/2024   AST 29 04/29/2024   ALKPHOS 79 04/29/2024   BILITOT 0.5 04/29/2024    Fibrosis 4 Score = 1.44 (Indeterminate)       Interpretation for patients with NAFLD          <1.30       -  F0-F1 (Low risk)          1.30-2.67 -  Indeterminate           >2.67      -  F3-F4 (High risk)     Validated for ages 49-65         Physical Exam: BP 114/80   Pulse 61   Ht 5' 5 (1.651 m)   Wt 250 lb 8 oz (113.6 kg)   BMI 41.69 kg/m  Constitutional: Pleasant,well-developed, female in no acute distress. Neurological: Alert and oriented to person place and time. Psychiatric: Normal mood and affect. Behavior is normal.   ASSESSMENT: 63 y.o. female here for assessment of the following  1. History of colon polyps   2. Antiplatelet or antithrombotic long-term use   3. FLATULENCE-GAS-BLOATING   4. Gastroesophageal reflux disease, unspecified whether esophagitis present   5. Long-term current use of proton pump inhibitor therapy   6. Metabolic dysfunction-associated steatotic liver disease (MASLD)    History of 7 adenomas/sessile serrated polyps removed in 2017, last exam in 2020 showed no concerning lesions.  Based on surveillance guidelines, now due for surveillance colonoscopy.  Discussed risks and benefits of the procedure and anesthesia and she wants to proceed.  Will seek approval to hold Plavix  for 5 days prior to the exam.  She agrees and wants to proceed.  Increased gas and bloating which is not new, she does endorse some food triggers.  Recommend avoidance of food triggers.  Provided low FODMAP diet handout to see if there is any thing she is eating in regards to high risk foods that she may want to avoid.  Also provided some samples of FODZYME to see if this will help as well, if it does she can get this over-the-counter.  We discussed her history of reflux, on a variety of PPIs in the past, currently on  moderate dose of Protonix  with Pepcid  nightly  and that is working pretty well for her.  Compared to other regimens and how she has done in the past, currently doing well.  Counseled on long-term risks of chronic PPIs, she understands this, currently kidney function normal, bone density normal.  Continue current regimen for now and reassess yearly, use lowest dose of PPI needed to control symptoms.  Counseled on MASLD, risks for fibrosis and cirrhosis over time.  She has had this for a while.  Platelets normal and LEE is normal, fib 4 intermediate.  I think a FibroScan is reasonable at some point.  We should be getting this at our office in the next few months, I will put her on a wait list to get that done and she is agreeable.  Continue regular coffee intake which can help prevent fibrotic change.  Trend LAE's yearly.   PLAN: - schedule colonoscopy at the [facility] - hold Plavix  for 5 days - low FODMAP diet handout - FODZYME samples - continue protonix  and pepcid , counseled on long term risks - counseled on fatty liver, risks for cirrhosis - work on weight loss, she may be enrolling in a trial for GLP1 agonist - put on waitlist for Fibroscan - continue coffee intake - yearly LFTs  Marcey Naval, MD [facility]

## 2024-05-12 ENCOUNTER — Other Ambulatory Visit (HOSPITAL_COMMUNITY): Payer: Self-pay

## 2024-05-12 NOTE — Telephone Encounter (Signed)
 Called and spoke to patient.  She made a note to hold Plavix  starting on 10-11

## 2024-05-14 ENCOUNTER — Ambulatory Visit

## 2024-05-15 ENCOUNTER — Encounter: Admitting: Cardiovascular Disease

## 2024-05-18 ENCOUNTER — Ambulatory Visit (INDEPENDENT_AMBULATORY_CARE_PROVIDER_SITE_OTHER)

## 2024-05-18 DIAGNOSIS — I639 Cerebral infarction, unspecified: Secondary | ICD-10-CM | POA: Diagnosis not present

## 2024-05-18 LAB — CUP PACEART REMOTE DEVICE CHECK
Date Time Interrogation Session: 20250928233119
Implantable Pulse Generator Implant Date: 20231219

## 2024-05-20 ENCOUNTER — Other Ambulatory Visit: Payer: Self-pay

## 2024-05-20 NOTE — Progress Notes (Signed)
 Remote Loop Recorder Transmission

## 2024-05-21 NOTE — Progress Notes (Signed)
 Remote Loop Recorder Transmission

## 2024-05-26 ENCOUNTER — Ambulatory Visit: Payer: Self-pay

## 2024-05-26 ENCOUNTER — Ambulatory Visit: Admitting: Family Medicine

## 2024-05-26 ENCOUNTER — Ambulatory Visit: Payer: Self-pay | Admitting: Cardiovascular Disease

## 2024-05-26 NOTE — Telephone Encounter (Signed)
 I left a voicemail telling her to call and get an earlier appt.

## 2024-05-26 NOTE — Telephone Encounter (Signed)
 Noted

## 2024-05-26 NOTE — Telephone Encounter (Unsigned)
 Copied from CRM [id number]. Topic: Appointments - Appointment Info/Confirmation >> May 26, 2024  3:30 PM Harlene ORN wrote: Patient will keep her appointment for Thursday @ 11:40

## 2024-05-26 NOTE — Telephone Encounter (Signed)
 FYI Only or Action Required?: FYI only for provider.  Patient was last seen in primary care on 03/16/2024 by Antonio Meth, Jamee SAUNDERS, DO.  Called Nurse Triage reporting Dysuria.  Symptoms began a week ago.  Interventions attempted: Nothing.  Symptoms are: unchanged.  Triage Disposition: See Physician Within 24 Hours  Patient/caregiver understands and will follow disposition?: No openings in 24 hours- placed on wait list needs sooner appt        Copied from CRM [id number]. Topic: Clinical - Red Word Triage >> May 26, 2024  8:23 AM Larissa RAMAN wrote: [facility] that prompted transfer to Nurse Triage: pain and burning with urination, Reason for Disposition  Urinating more frequently than usual (i.e., frequency) OR new-onset of the feeling of an urgent need to urinate (i.e., urgency)  Answer Assessment - Initial Assessment Questions 1. SYMPTOM: What's the main symptom you're concerned about? (e.g., frequency, incontinence)     Pain and urination after urination 2. ONSET: When did the  sx  start?     Last week 3. PAIN: Is there any pain? If Yes, ask: How bad is it? (Scale: 1-10; mild, moderate, severe)     Feels suprapubic area aching 4. CAUSE: What do you think is causing the symptoms?    unsure 5. OTHER SYMPTOMS: Do you have any other symptoms? (e.g., blood in urine, fever, flank pain, pain with urination)     Pain AFTER urination 6. PREGNANCY: Is there any chance you are pregnant? When was your last menstrual period?     N/a  Protocols used: Urinary Symptoms-A-AH

## 2024-05-28 ENCOUNTER — Ambulatory Visit (HOSPITAL_BASED_OUTPATIENT_CLINIC_OR_DEPARTMENT_OTHER)
Admission: RE | Admit: 2024-05-28 | Discharge: 2024-05-28 | Disposition: A | Discharge status: Home / Self Care | Source: Ambulatory Visit | Attending: Family Medicine | Admitting: Family Medicine

## 2024-05-28 ENCOUNTER — Ambulatory Visit: Admitting: Family Medicine

## 2024-05-28 ENCOUNTER — Other Ambulatory Visit (HOSPITAL_COMMUNITY): Payer: Self-pay

## 2024-05-28 ENCOUNTER — Other Ambulatory Visit (HOSPITAL_BASED_OUTPATIENT_CLINIC_OR_DEPARTMENT_OTHER): Payer: Self-pay

## 2024-05-28 ENCOUNTER — Encounter: Payer: Self-pay | Admitting: Family Medicine

## 2024-05-28 ENCOUNTER — Ambulatory Visit: Payer: Self-pay | Admitting: Family Medicine

## 2024-05-28 VITALS — BP 134/88 | HR 85 | Temp 98.1°F | Resp 18 | Ht 65.0 in | Wt 255.8 lb

## 2024-05-28 DIAGNOSIS — R102 Pelvic and perineal pain unspecified side: Secondary | ICD-10-CM

## 2024-05-28 DIAGNOSIS — I10 Essential (primary) hypertension: Secondary | ICD-10-CM

## 2024-05-28 DIAGNOSIS — M79661 Pain in right lower leg: Secondary | ICD-10-CM

## 2024-05-28 DIAGNOSIS — R300 Dysuria: Secondary | ICD-10-CM

## 2024-05-28 DIAGNOSIS — Z23 Encounter for immunization: Secondary | ICD-10-CM

## 2024-05-28 LAB — COMPREHENSIVE METABOLIC PANEL WITH GFR
ALT: 18 U/L (ref 0–35)
AST: 18 U/L (ref 0–37)
Albumin: 4.1 g/dL (ref 3.5–5.2)
Alkaline Phosphatase: 63 U/L (ref 39–117)
BUN: 13 mg/dL (ref 6–23)
CO2: 32 meq/L (ref 19–32)
Calcium: 9.2 mg/dL (ref 8.4–10.5)
Chloride: 105 meq/L (ref 96–112)
Creatinine, Ser: 0.95 mg/dL (ref 0.40–1.20)
GFR: 63.38 mL/min (ref >60.00)
Glucose, Bld: 95 mg/dL (ref 70–99)
Potassium: 3.9 meq/L (ref 3.5–5.1)
Sodium: 143 meq/L (ref 135–145)
Total Bilirubin: 0.5 mg/dL (ref 0.2–1.2)
Total Protein: 6.6 g/dL (ref 6.0–8.3)

## 2024-05-28 LAB — POC URINALSYSI DIPSTICK (AUTOMATED)
Bilirubin, UA: NEGATIVE
Blood, UA: NEGATIVE
Glucose, UA: NEGATIVE
Ketones, UA: NEGATIVE
Leukocytes, UA: NEGATIVE
Nitrite, UA: NEGATIVE
Protein, UA: NEGATIVE
Spec Grav, UA: 1.01 (ref 1.010–1.025)
Urobilinogen, UA: 0.2 U/dL
pH, UA: 6 (ref 5.0–8.0)

## 2024-05-28 LAB — CBC WITH DIFFERENTIAL/PLATELET
Basophils Absolute: 0 K/uL (ref 0.0–0.1)
Basophils Relative: 0.7 % (ref 0.0–3.0)
Eosinophils Absolute: 0.1 K/uL (ref 0.0–0.7)
Eosinophils Relative: 3.1 % (ref 0.0–5.0)
HCT: 40.7 % (ref 36.0–46.0)
Hemoglobin: 13.2 g/dL (ref 12.0–15.0)
Lymphocytes Relative: 44.9 % (ref 12.0–46.0)
Lymphs Abs: 2 K/uL (ref 0.7–4.0)
MCHC: 32.3 g/dL (ref 30.0–36.0)
MCV: 88.6 fl (ref 78.0–100.0)
Monocytes Absolute: 0.5 K/uL (ref 0.1–1.0)
Monocytes Relative: 10.9 % (ref 3.0–12.0)
Neutro Abs: 1.8 K/uL (ref 1.4–7.7)
Neutrophils Relative %: 40.4 % — ABNORMAL LOW (ref 43.0–77.0)
Platelets: 208 K/uL (ref 150.0–400.0)
RBC: 4.6 Mil/uL (ref 3.87–5.11)
RDW: 15.1 % (ref 11.5–15.5)
WBC: 4.4 K/uL (ref 4.0–10.5)

## 2024-05-28 MED ORDER — METOPROLOL SUCCINATE ER 25 MG PO TB24
25.0000 mg | ORAL_TABLET | Freq: Every day | ORAL | 3 refills | Status: AC
  Filled 2024-05-28 (×2): qty 90, 90d supply, fill #0
  Filled 2024-06-25 – 2024-09-08 (×2): qty 90, 90d supply, fill #1

## 2024-05-28 NOTE — Progress Notes (Signed)
 Subjective:    Patient ID: Emily Osborne, female    DOB: [birth year], 63 y.o.   MRN: [id number]  Chief Complaint  Patient presents with   Dysuria    Pt states sxs started last week, pt states having abdominal pain and burning. Sxs resolve after urination.     HPI Patient is in today for suprapubic pain.  Discussed the use of AI scribe software for clinical note transcription with the patient, who gave verbal consent to proceed.  History of Present Illness Emily Osborne is a 63 year old female who presents with suprapubic pain and leg swelling.  She has been experiencing suprapubic pain for about a week, described as burning and aching sensations that occur when her bladder fills. The pain worsens when lying down and persists after urination. She is concerned about her pelvic health as she has not had a recent Pap smear.  She reports swelling and pain in the back of her leg, which began yesterday. The leg is swollen and painful, but not red. She has not been taking her prescribed Lasix  for the past two weeks due to the need to be near a bathroom, as she has been busy with various appointments. Previously, taking Lasix  helped reduce swelling and pain in her leg.  Her blood pressure was noted to be elevated at a recent eye doctor appointment, measuring 157/101 mmHg, although she typically does not have high blood pressure. At home, her blood pressure readings have varied, with some readings within normal range and others slightly elevated. She has been experiencing stress due to family issues, which she believes may be affecting her blood pressure.  She has a history of floaters in her right eye and was recently found to have a small bleed in her left eye, which is expected to resolve on its own. No flashes, but she continues to experience floaters.  She has a history of severe stenosis in her neck and is scheduled to see a spine specialist next week. She experiences some arm pain  related to this condition.  She has not received her pneumonia shot, which she intended to get during her last visit but forgot. Her family has recently been affected by the flu, increasing her concern about getting vaccinated.   Past Medical History:  Diagnosis Date   Allergy    Anxiety    Asthma    pt has inhaler   Back pain    C. difficile colitis    CAD (coronary artery disease) 2025   Cataract    bil eyes   Cerebrovascular accident (CVA) (HCC) 06/29/2022   Chest pain    Chronic fatigue syndrome    Clostridium difficile colitis 12/13/2016   Diverticulitis    Dyspnea    Dysrhythmia    palpitations   Edema    Elevated blood pressure reading without diagnosis of hypertension    just elevated when I'm in pain (12/07/2015)   Fatty liver    Fibromyalgia    Ganglion of joint    right wrist   GERD (gastroesophageal reflux disease)    Graves disease 01/02/2016   H. pylori infection    Headache    hx of   Helicobacter pylori (H. pylori) infection 12/13/2016   Herpes zoster without mention of complication    HTN (hypertension)    Hyperglycemia 08/07/2018   Hyperlipidemia    Hyperthyroidism    IBS (irritable bowel syndrome)    Leg edema    Multiple drug allergies  06/18/2017   Myalgia    Nontraumatic rupture of Achilles tendon    Other acute reactions to stress    Pain in joint, shoulder region    Pain in joint, upper arm    Pain in limb    Palpitations    Personal history of other diseases of digestive system    Pneumonia    once   PONV (postoperative nausea and vomiting)    Primary osteoarthritis of both hands 01/07/2024   Clinical findings were suggestive of osteoarthritis.  His x-rays were suggestive of osteoarthritis.  Anti-CCP 28 weak positive.     Primary osteoarthritis of both knees 01/07/2024   Routine screening for STI (sexually transmitted infection) 12/13/2016   S/P partial colectomy 06/18/2017   Thyroid  disease    hyperthyroidism   Vitamin B 12  deficiency     Past Surgical History:  Procedure Laterality Date   ABDOMINAL HYSTERECTOMY     ACHILLES TENDON REPAIR Right 2005   BUBBLE STUDY  07/09/2022   Procedure: BUBBLE STUDY;  Surgeon: Lonni Slain, MD;  Location: [facility] ENDOSCOPY;  Service: Cardiovascular;;   BUNIONECTOMY Bilateral    Bunionectomy 1983   COLON SURGERY  01/23/2017   6 to 8 inches sigmoid colon removed   COLONOSCOPY     GANGLION CYST EXCISION Right    wrist   LEFT HEART CATH AND CORONARY ANGIOGRAPHY N/A 10/03/2016   Procedure: Left Heart Cath and Coronary Angiography;  Surgeon: Lonni JONETTA Cash, MD;  Location: [facility] INVASIVE CV LAB;  Service: Cardiovascular;  Laterality: N/A;   LOOP RECORDER IMPLANT  07/2022   MENISCUS REPAIR Right 04/2014   POLYPECTOMY     TEE WITHOUT CARDIOVERSION N/A 07/09/2022   Procedure: TRANSESOPHAGEAL ECHOCARDIOGRAM (TEE);  Surgeon: Lonni Slain, MD;  Location: [facility] ENDOSCOPY;  Service: Cardiovascular;  Laterality: N/A;   TUBAL LIGATION     wisdomteeth extraction      Family History  Problem Relation Age of Onset   Hyperlipidemia Mother    Hypertension Mother        Died, 43   Thyroid  disease Mother        Thyroid  surgery   Obesity Mother    Heart disease Mother    Hypertension Father    Diabetes Father    Heart disease Father    Kidney disease Father        Died, 36   Hyperlipidemia Father    Pernicious anemia Sister    Thyroid  disease Sister        On thyroid  Rx   Atrial fibrillation Sister    Other Sister        hypoglycemia   Leukemia Brother    Cancer Brother        myleoblastic anemia   Diabetes Brother    Coronary artery disease Brother    Hypertension Brother    Aplastic anemia Daughter    Hypertension Daughter    Migraines Daughter    GER disease Daughter    Other Daughter        pericarditis   Bipolar disorder Daughter    Schizophrenia Daughter    Colon cancer Neg Hx    Esophageal cancer Neg Hx    Rectal cancer Neg Hx    Stomach  cancer Neg Hx    Pancreatic cancer Neg Hx    Prostate cancer Neg Hx    Colon polyps Neg Hx     Social History   Socioeconomic History   Marital status: Divorced    Spouse name:  Not on file   Number of children: 2   Years of education: Not on file   Highest education level: Not on file  Occupational History   Occupation: Nurse/teacher  Tobacco Use   Smoking status: Never    Passive exposure: Past   Smokeless tobacco: Never  Vaping Use   Vaping status: Never Used  Substance and Sexual Activity   Alcohol use: No    Alcohol/week: 0.0 standard drinks of alcohol   Drug use: No   Sexual activity: Not Currently    Partners: Male  Other Topics Concern   Not on file  Social History Narrative   Lives with alone.  She has two daughter.   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   Right Handed   Caffeine 12 oz to 24 oz a day   Social Drivers of Corporate investment banker Strain: Not on file  Food Insecurity: Not on file  Transportation Needs: Not on file  Physical Activity: Not on file  Stress: Not on file  Social Connections: Not on file  Intimate Partner Violence: Not on file    Outpatient Medications Prior to Visit  Medication Sig Dispense Refill   acetaminophen  (TYLENOL ) 650 MG CR tablet Take 650 mg by mouth as needed for pain.     albuterol  (VENTOLIN  HFA) 108 (90 Base) MCG/ACT inhaler Inhale 2 puffs into the lungs every 4 (four) hours as needed for wheezing or shortness of breath. 18 g 2   ALPRAZolam  (XANAX ) 0.25 MG tablet Take 1 tablet (0.25 mg total) by mouth 3 (three) times daily as needed. 30 tablet 2   AMBULATORY NON FORMULARY MEDICATION Medication Name: FODZYMES- use as directed     atorvastatin  (LIPITOR) 40 MG tablet Take 1 tablet (40 mg) by mouth daily. 90 tablet 1   Blood Glucose Monitoring Suppl (FREESTYLE FREEDOM) KIT 1 Device by Does not apply route daily. 1 each 0   budesonide -formoterol  (SYMBICORT ) 80-4.5 MCG/ACT inhaler Inhale 2 puffs  into the lungs 2 (two) times daily, in the morning and 12 hours later. 10.2 g 0   cholecalciferol  (VITAMIN D3) 25 MCG (1000 UNIT) tablet 2,000 units 6 days weekly.     clopidogrel  (PLAVIX ) 75 MG tablet Take 1 tablet (75 mg total) by mouth daily. 90 tablet 3   diclofenac  sodium (VOLTAREN ) 1 % GEL Apply 2 g topically 4 (four) times daily. Rub into affected area of foot 2 to 4 times daily 100 g 2   EPINEPHrine  (EPIPEN  2-PAK) 0.3 mg/0.3 mL IJ SOAJ injection Inject 0.3 mg into the muscle as needed for anaphylaxis. 2 each 2   famotidine  (PEPCID ) 20 MG tablet Take 1 tablet (20 mg) by mouth daily after supper 30 tablet 11   fluticasone  (FLONASE ) 50 MCG/ACT nasal spray PLACE 2 SPRAYS INTO BOTH NOSTRILS DAILY. 16 g 5   furosemide  (LASIX ) 40 MG tablet Take 1 tablet (40 mg total) by mouth daily. 90 tablet 3   glucose blood (FREESTYLE LITE) test strip USE TO CHECK BLOOD SUGAR ONCE A DAY 100 each 1   levocetirizine (XYZAL ) 5 MG tablet Take 1 tablet (5 mg total) by mouth every evening. 90 tablet 1   lidocaine  (LIDODERM ) 5 % Apply to the affected area once daily as needed for a week. **Remove patch after 12 hours** 30 patch 0   meclizine  (ANTIVERT ) 12.5 MG tablet Take 1 tablet (12.5 mg total) by mouth 2 (two) times daily as needed for dizziness. 30 tablet  1   methimazole  (TAPAZOLE ) 5 MG tablet Take 1 tablet (5 mg total) by mouth 3 (three) days a week (M,W,F) 45 tablet 3   pantoprazole  (PROTONIX ) 40 MG tablet Take 1 tablet (40 mg total) by mouth daily. 90 tablet 3   Probiotic Product (ALIGN) 4 MG CAPS Take 1 capsule (4 mg total) by mouth daily. 90 capsule 3   promethazine -dextromethorphan (PROMETHAZINE -DM) 6.25-15 MG/5ML syrup Take 5 mLs by mouth 4 (four) times daily as needed. 118 mL 0   sucralfate  (CARAFATE ) 1 g tablet Take 1 tablet by mouth every 6 hours as needed. 60 tablet 3   triamcinolone  ointment (KENALOG ) 0.1 % Apply as directed to affected area twice a day 454 g 3   valACYclovir  (VALTREX ) 1000 MG tablet  Take 1 tablet (1,000 mg total) by mouth 3 (three) times daily as needed. 30 tablet 2   Vitamin D , Ergocalciferol , (DRISDOL ) 1.25 MG (50000 UNIT) CAPS capsule Take 1 capsule (50,000 Units total) by mouth every 7 (seven) days. 12 capsule 1   No facility-administered medications prior to visit.    Allergies  Allergen Reactions   Aspirin Anaphylaxis   Gentamicin Other (See Comments)    Eye drops turned the sclera bright red   Ibuprofen Anaphylaxis   Metronidazole Swelling and Other (See Comments)    Red face/swelling   Nsaids Anaphylaxis   Augmentin  [Amoxicillin -Pot Clavulanate]    Doxycycline  Nausea And Vomiting and Other (See Comments)    Severe nausea/vomiting   Influenza A (H1n1) Monoval Vac Other (See Comments)    Sick for 3 weeks   Loratadine  Other (See Comments)    Fatigue/weakness   Metformin  And Related Other (See Comments)    Memory issues and headache   Periactin [Cyproheptadine] Other (See Comments)    Paranoid    Ranitidine Hcl Other (See Comments)    Lips turned red /peel   Sulfamethoxazole-Trimethoprim Other (See Comments)    Face red/peel   Tramadol Hcl Other (See Comments)    Paranoid    Review of Systems  Constitutional:  Negative for fever and malaise/fatigue.  HENT:  Negative for congestion.   Eyes:  Negative for blurred vision.  Respiratory:  Negative for shortness of breath.   Cardiovascular:  Negative for chest pain, palpitations and leg swelling.  Gastrointestinal:  Negative for abdominal pain, blood in stool and nausea.  Genitourinary:  Positive for dysuria. Negative for frequency.  Musculoskeletal:  Negative for falls.  Skin:  Negative for rash.  Neurological:  Negative for dizziness, loss of consciousness and headaches.  Endo/Heme/Allergies:  Negative for environmental allergies.  Psychiatric/Behavioral:  Negative for depression. The patient is not nervous/anxious.        Objective:    Physical Exam Vitals and nursing note reviewed.   Constitutional:      General: She is not in acute distress.    Appearance: Normal appearance. She is well-developed.  HENT:     Head: Normocephalic and atraumatic.  Eyes:     General: No scleral icterus.       Right eye: No discharge.        Left eye: No discharge.  Cardiovascular:     Rate and Rhythm: Normal rate and regular rhythm.     Heart sounds: No murmur heard. Pulmonary:     Effort: Pulmonary effort is normal. No respiratory distress.     Breath sounds: Normal breath sounds.  Abdominal:     General: Abdomen is flat.     Tenderness: There is no abdominal  tenderness. There is no right CVA tenderness, left CVA tenderness, guarding or rebound.  Musculoskeletal:        General: Normal range of motion.     Cervical back: Normal range of motion and neck supple.     Right lower leg: Tenderness present. Edema present.     Left lower leg: Normal. No edema.  Skin:    General: Skin is warm and dry.  Neurological:     Mental Status: She is alert and oriented to person, place, and time.  Psychiatric:        Mood and Affect: Mood normal.        Behavior: Behavior normal.        Thought Content: Thought content normal.        Judgment: Judgment normal.     BP 134/88 (BP Location: Left Arm, Patient Position: Sitting, Cuff Size: Large)   Pulse 85   Temp 98.1 F (36.7 C) (Oral)   Resp 18   Ht 5' 5 (1.651 m)   Wt 255 lb 12.8 oz (116 kg)   SpO2 97%   BMI 42.57 kg/m  Wt Readings from Last 3 Encounters:  05/28/24 255 lb 12.8 oz (116 kg)  05/11/24 250 lb 8 oz (113.6 kg)  05/05/24 253 lb (114.8 kg)    Diabetic Foot Exam - Simple   No data filed    Lab Results  Component Value Date   WBC 4.5 04/29/2024   HGB 13.5 04/29/2024   HCT 40.5 04/29/2024   PLT 253 04/29/2024   GLUCOSE 111 (H) 04/29/2024   CHOL 144 03/16/2024   TRIG 163.0 (H) 03/16/2024   HDL 44.90 03/16/2024   LDLDIRECT 139.4 12/26/2011   LDLCALC 66 03/16/2024   ALT 26 04/29/2024   AST 29 04/29/2024   NA  141 04/29/2024   K 4.3 04/29/2024   CL 106 04/29/2024   CREATININE 0.97 04/29/2024   BUN 13 04/29/2024   CO2 23 04/29/2024   TSH 0.98 03/16/2024   INR 1.0 04/10/2023   HGBA1C 6.2 03/16/2024    Lab Results  Component Value Date   TSH 0.98 03/16/2024   Lab Results  Component Value Date   WBC 4.5 04/29/2024   HGB 13.5 04/29/2024   HCT 40.5 04/29/2024   MCV 86.4 04/29/2024   PLT 253 04/29/2024   Lab Results  Component Value Date   NA 141 04/29/2024   K 4.3 04/29/2024   CO2 23 04/29/2024   GLUCOSE 111 (H) 04/29/2024   BUN 13 04/29/2024   CREATININE 0.97 04/29/2024   BILITOT 0.5 04/29/2024   ALKPHOS 79 04/29/2024   AST 29 04/29/2024   ALT 26 04/29/2024   PROT 7.4 04/29/2024   ALBUMIN 4.4 04/29/2024   CALCIUM  9.5 04/29/2024   ANIONGAP 12 04/29/2024   EGFR 76 12/12/2023   GFR 53.82 (L) 03/16/2024   Lab Results  Component Value Date   CHOL 144 03/16/2024   Lab Results  Component Value Date   HDL 44.90 03/16/2024   Lab Results  Component Value Date   LDLCALC 66 03/16/2024   Lab Results  Component Value Date   TRIG 163.0 (H) 03/16/2024   Lab Results  Component Value Date   CHOLHDL 3 03/16/2024   Lab Results  Component Value Date   HGBA1C 6.2 03/16/2024       Assessment & Plan:  Pelvic pain -     US  PELVIC COMPLETE WITH TRANSVAGINAL; Future -     CBC with Differential/Platelet -  Comprehensive metabolic panel with GFR  Dysuria -     POCT Urinalysis Dipstick (Automated) -     Urine Culture  Need for pneumococcal 20-valent conjugate vaccination -     Pneumococcal conjugate vaccine 20-valent  Right calf pain -     US  Venous Img Lower Unilateral Right (DVT); Future  Primary hypertension -     Metoprolol  Succinate ER; Take 1 tablet (25 mg total) by mouth daily.  Dispense: 90 tablet; Refill: 3  Assessment and Plan Assessment & Plan Unilateral leg swelling and pain   Swelling and pain are present in the back of her leg without redness. Order a  leg ultrasound to evaluate the cause of the swelling and pain.  Dysuria   She experiences a burning sensation in the suprapubic area, especially when the bladder is full, with symptoms persisting post-urination. A normal urinalysis rules out a urinary tract infection. The differential includes pelvic issues or ovarian pathology. Order a pelvic ultrasound to evaluate for pelvic or ovarian pathology.  Hypertension   Blood pressure readings are elevated at 157/101 mmHg and 138/85 mmHg. Stress and pain may contribute to these levels, raising concern for potential stroke or heart attack. Prescribe metoprolol  XL once daily to manage blood pressure and reduce risk of stroke or heart attack.  Severe cervical spinal stenosis   She has severe cervical spinal stenosis with associated arm pain. She is scheduled to see a spine specialist for further evaluation.  General Health Maintenance   She missed a previous pneumonia vaccination. There is increased concern for respiratory infections due to recent family flu cases. Administer the pneumonia vaccination.    Saintclair Schroader R Lowne [name], DO

## 2024-05-29 LAB — URINE CULTURE
MICRO NUMBER:: 17078747
Result:: NO GROWTH
SPECIMEN QUALITY:: ADEQUATE

## 2024-05-31 ENCOUNTER — Encounter: Payer: Self-pay | Admitting: Family Medicine

## 2024-06-01 ENCOUNTER — Ambulatory Visit: Admitting: Orthopedic Surgery

## 2024-06-02 NOTE — Telephone Encounter (Signed)
 Form placed in folder and in you office

## 2024-06-04 ENCOUNTER — Ambulatory Visit (AMBULATORY_SURGERY_CENTER): Admitting: Gastroenterology

## 2024-06-04 ENCOUNTER — Encounter: Payer: Self-pay | Admitting: Gastroenterology

## 2024-06-04 VITALS — BP 121/79 | HR 67 | Temp 97.2°F | Resp 16 | Ht 65.0 in | Wt 250.0 lb

## 2024-06-04 DIAGNOSIS — D123 Benign neoplasm of transverse colon: Secondary | ICD-10-CM

## 2024-06-04 DIAGNOSIS — D122 Benign neoplasm of ascending colon: Secondary | ICD-10-CM | POA: Diagnosis not present

## 2024-06-04 DIAGNOSIS — Z860100 Personal history of colon polyps, unspecified: Secondary | ICD-10-CM

## 2024-06-04 DIAGNOSIS — Z1211 Encounter for screening for malignant neoplasm of colon: Secondary | ICD-10-CM | POA: Diagnosis present

## 2024-06-04 DIAGNOSIS — K5730 Diverticulosis of large intestine without perforation or abscess without bleeding: Secondary | ICD-10-CM | POA: Diagnosis not present

## 2024-06-04 DIAGNOSIS — D120 Benign neoplasm of cecum: Secondary | ICD-10-CM | POA: Diagnosis not present

## 2024-06-04 DIAGNOSIS — K621 Rectal polyp: Secondary | ICD-10-CM

## 2024-06-04 DIAGNOSIS — D128 Benign neoplasm of rectum: Secondary | ICD-10-CM

## 2024-06-04 DIAGNOSIS — K635 Polyp of colon: Secondary | ICD-10-CM | POA: Diagnosis not present

## 2024-06-04 MED ORDER — SODIUM CHLORIDE 0.9 % IV SOLN: 500.0000 mL | Freq: Once | INTRAVENOUS | Status: DC

## 2024-06-04 NOTE — Progress Notes (Signed)
 Report to PACU, RN, vss, BBS= Clear.

## 2024-06-04 NOTE — Op Note (Signed)
 [facility] Patient Name: Emily Osborne Procedure Date: 06/04/2024 1:45 PM MRN: [id number] Endoscopist: Elspeth P. Leigh , MD, [id number] Age: 63 Referring MD:  Date of Birth: [birth year] Gender: Female Account #: [id number] Procedure:                Colonoscopy Indications:              High risk colon cancer surveillance: Personal                            history of colonic polyps - 7 polyps removed                            09/2015, 3 polyps removed 09/2018 Medicines:                Monitored Anesthesia Care Procedure:                Pre-Anesthesia Assessment:                           - Prior to the procedure, a History and Physical                            was performed, and patient medications and                            allergies were reviewed. The patient's tolerance of                            previous anesthesia was also reviewed. The risks                            and benefits of the procedure and the sedation                            options and risks were discussed with the patient.                            All questions were answered, and informed consent                            was obtained. Prior Anticoagulants: The patient has                            taken Plavix  (clopidogrel ), last dose was 5 days                            prior to procedure. ASA Grade Assessment: III - A                            patient with severe systemic disease. After                            reviewing the risks and benefits, the patient was  deemed in satisfactory condition to undergo the                            procedure.                           After obtaining informed consent, the colonoscope                            was passed under direct vision. Throughout the                            procedure, the patient's blood pressure, pulse, and                            oxygen saturations were monitored continuously. The                             Olympus Scope [id number] was introduced through the                            anus and advanced to the the cecum, identified by                            appendiceal orifice and ileocecal valve. The                            colonoscopy was performed without difficulty. The                            patient tolerated the procedure well. The quality                            of the bowel preparation was adequate. The                            ileocecal valve, appendiceal orifice, and rectum                            were photographed. Scope In: 1:58:25 PM Scope Out: 2:14:54 PM Scope Withdrawal Time: 0 hours 13 minutes 1 second  Total Procedure Duration: 0 hours 16 minutes 29 seconds  Findings:                 The perianal and digital rectal examinations were                            normal.                           Two flat polyps were found in the cecum. The polyps                            were 2 to 3 mm in size. These polyps were removed  with a cold snare. Resection and retrieval were                            complete.                           A 3 mm polyp was found in the ascending colon. The                            polyp was sessile. The polyp was removed with a                            cold snare. Resection and retrieval were complete.                           Two sessile polyps were found in the transverse                            colon. The polyps were 2 mm in size. These polyps                            were removed with a cold snare. Resection and                            retrieval were complete.                           A 2 to 3 mm polyp was found in the rectum. The                            polyp was sessile. The polyp was removed with a                            cold snare. Resection and retrieval were complete.                           Many diverticula were found in the entire colon.                            The exam was otherwise without abnormality. Complications:            No immediate complications. Estimated blood loss:                            Minimal. Estimated Blood Loss:     Estimated blood loss was minimal. Impression:               - Two 2 to 3 mm polyps in the cecum, removed with a                            cold snare. Resected and retrieved.                           - One  3 mm polyp in the ascending colon, removed                            with a cold snare. Resected and retrieved.                           - Two 2 mm polyps in the transverse colon, removed                            with a cold snare. Resected and retrieved.                           - One 2 to 3 mm polyp in the rectum, removed with a                            cold snare. Resected and retrieved.                           - Diverticulosis in the entire examined colon.                           - The examination was otherwise normal. Recommendation:           - Patient has a contact number available for                            emergencies. The signs and symptoms of potential                            delayed complications were discussed with the                            patient. Return to normal activities tomorrow.                            Written discharge instructions were provided to the                            patient.                           - Resume previous diet.                           - Continue present medications.                           - Resume Plavix  tomorrow.                           - Await pathology results. Elspeth P. Lynsay Fesperman, MD 06/04/2024 2:20:00 PM This report has been signed electronically.

## 2024-06-04 NOTE — Progress Notes (Signed)
 History and Physical Interval Note: seen in the office on 9/22 - no interval changes. Here for colonoscopy for surveillance of polyps. Exams in 2017 - 7 polyps and 2020 - 3 polyps. On Plavix , held for 5 days for this exam. She wishes to proceed. She feels well otherwise today without complaints.    06/04/2024 1:48 PM  Emily Osborne  has presented today for endoscopic procedure(s), with the diagnosis of  Encounter Diagnosis  Name Primary?   History of colon polyps Yes  .  The various methods of evaluation and treatment have been discussed with the patient and/or family. After consideration of risks, benefits and other options for treatment, the patient has consented to  the endoscopic procedure(s).   The patient's history has been reviewed, patient examined, no change in status, stable for surgery.  I have reviewed the patient's chart and labs.  Questions were answered to the patient's satisfaction.    Marcey Naval, MD [facility]'

## 2024-06-04 NOTE — Patient Instructions (Addendum)
 Resume previous diet.  Continue present medications. Resume Plavix  tomorrow. Awaiting pathology results.  Handouts provided on polyps and diverticulosis.   YOU HAD AN ENDOSCOPIC PROCEDURE TODAY AT THE [facility]:   Refer to the procedure report that was given to you for any specific questions about what was found during the examination.  If the procedure report does not answer your questions, please call your gastroenterologist to clarify.  If you requested that your care partner not be given the details of your procedure findings, then the procedure report has been included in a sealed envelope for you to review at your convenience later.  YOU SHOULD EXPECT: Some feelings of bloating in the abdomen. Passage of more gas than usual.  Walking can help get rid of the air that was put into your GI tract during the procedure and reduce the bloating. If you had a lower endoscopy (such as a colonoscopy or flexible sigmoidoscopy) you may notice spotting of blood in your stool or on the toilet paper. If you underwent a bowel prep for your procedure, you may not have a normal bowel movement for a few days.  Please Note:  You might notice some irritation and congestion in your nose or some drainage.  This is from the oxygen used during your procedure.  There is no need for concern and it should clear up in a day or so.  SYMPTOMS TO REPORT IMMEDIATELY:  Following lower endoscopy (colonoscopy or flexible sigmoidoscopy):  Excessive amounts of blood in the stool  Significant tenderness or worsening of abdominal pains  Swelling of the abdomen that is new, acute  Fever of 100F or higher  For urgent or emergent issues, a gastroenterologist can be reached at any hour by calling [phone]. Do not use MyChart messaging for urgent concerns.    DIET:  We do recommend a small meal at first, but then you may proceed to your regular diet.  Drink plenty of fluids but you should avoid alcoholic  beverages for 24 hours.  ACTIVITY:  You should plan to take it easy for the rest of today and you should NOT DRIVE or use heavy machinery until tomorrow (because of the sedation medicines used during the test).    FOLLOW UP: Our staff will call the number listed on your records the next business day following your procedure.  We will call around 7:15- 8:00 am to check on you and address any questions or concerns that you may have regarding the information given to you following your procedure. If we do not reach you, we will leave a message.     If any biopsies were taken you will be contacted by phone or by letter within the next 1-3 weeks.  Please call us  at [phone] if you have not heard about the biopsies in 3 weeks.    SIGNATURES/CONFIDENTIALITY: You and/or your care partner have signed paperwork which will be entered into your electronic medical record.  These signatures attest to the fact that that the information above on your After Visit Summary has been reviewed and is understood.  Full responsibility of the confidentiality of this discharge information lies with you and/or your care-partner.

## 2024-06-04 NOTE — Progress Notes (Signed)
 Called to room to assist during endoscopic procedure.  Patient ID and intended procedure confirmed with present staff. Received instructions for my participation in the procedure from the performing physician.

## 2024-06-05 ENCOUNTER — Telehealth: Payer: Self-pay

## 2024-06-05 NOTE — Telephone Encounter (Signed)
 Left message on answering machine.

## 2024-06-10 ENCOUNTER — Ambulatory Visit: Payer: Self-pay | Admitting: Gastroenterology

## 2024-06-10 LAB — SURGICAL PATHOLOGY

## 2024-06-18 ENCOUNTER — Encounter: Payer: Self-pay | Admitting: Family Medicine

## 2024-06-18 ENCOUNTER — Ambulatory Visit (INDEPENDENT_AMBULATORY_CARE_PROVIDER_SITE_OTHER)

## 2024-06-18 DIAGNOSIS — I639 Cerebral infarction, unspecified: Secondary | ICD-10-CM

## 2024-06-18 LAB — CUP PACEART REMOTE DEVICE CHECK
Date Time Interrogation Session: 20251029232914
Implantable Pulse Generator Implant Date: 20231219

## 2024-06-18 NOTE — Progress Notes (Signed)
 Remote Loop Recorder Transmission

## 2024-06-22 ENCOUNTER — Encounter: Payer: Self-pay | Admitting: Radiology

## 2024-06-25 ENCOUNTER — Other Ambulatory Visit (HOSPITAL_COMMUNITY): Payer: Self-pay

## 2024-06-25 ENCOUNTER — Other Ambulatory Visit: Payer: Self-pay | Admitting: Family Medicine

## 2024-06-25 ENCOUNTER — Ambulatory Visit: Payer: Self-pay | Admitting: Cardiovascular Disease

## 2024-06-25 ENCOUNTER — Other Ambulatory Visit: Payer: Self-pay

## 2024-06-25 ENCOUNTER — Other Ambulatory Visit: Payer: Self-pay | Admitting: Internal Medicine

## 2024-06-25 ENCOUNTER — Ambulatory Visit
Admission: RE | Admit: 2024-06-25 | Discharge: 2024-06-25 | Disposition: A | Discharge status: Home / Self Care | Source: Ambulatory Visit | Attending: Family Medicine | Admitting: Family Medicine

## 2024-06-25 DIAGNOSIS — Z1231 Encounter for screening mammogram for malignant neoplasm of breast: Secondary | ICD-10-CM

## 2024-06-25 DIAGNOSIS — R600 Localized edema: Secondary | ICD-10-CM

## 2024-06-26 ENCOUNTER — Other Ambulatory Visit (HOSPITAL_COMMUNITY): Payer: Self-pay

## 2024-06-26 ENCOUNTER — Other Ambulatory Visit: Payer: Self-pay

## 2024-06-26 MED ORDER — FUROSEMIDE 40 MG PO TABS
40.0000 mg | ORAL_TABLET | Freq: Every day | ORAL | 1 refills | Status: AC
  Filled 2024-06-26: qty 90, 90d supply, fill #0

## 2024-07-02 ENCOUNTER — Other Ambulatory Visit: Payer: Self-pay

## 2024-07-02 ENCOUNTER — Ambulatory Visit (INDEPENDENT_AMBULATORY_CARE_PROVIDER_SITE_OTHER): Admitting: Neurology

## 2024-07-02 ENCOUNTER — Ambulatory Visit: Admitting: Orthopedic Surgery

## 2024-07-02 VITALS — BP 122/83 | HR 58 | Ht 65.0 in | Wt 256.0 lb

## 2024-07-02 DIAGNOSIS — M5412 Radiculopathy, cervical region: Secondary | ICD-10-CM

## 2024-07-02 DIAGNOSIS — M542 Cervicalgia: Secondary | ICD-10-CM

## 2024-07-02 NOTE — Progress Notes (Signed)
 Orthopedic Spine Surgery Office Note  Assessment: Patient is a 63 y.o. female with neck pain that radiates into the left upper extremity along the lateral arm, dorsal forearm, and into the hand.  Suspect radiculopathy   Plan: -Explained that initially conservative treatment is tried as a significant number of patients may experience relief with these treatment modalities. Discussed that the conservative treatments include:  -activity modification  -physical therapy  -over the counter pain medications  -medrol  dosepak  -cervical steroid injections -Patient has tried Tylenol , oral steroids -Recommended PT.  Referral provided to her today -If she is not doing any better at her next visit, we will get an MRI of the cervical spine to evaluate for radiculopathy -Patient should return to office in 8 weeks, x-rays at next visit: none   Patient expressed understanding of the plan and all questions were answered to the patient's satisfaction.   ___________________________________________________________________________   History:  Patient is a 63 y.o. female who presents today for cervical spine.  Patient has had about 2 months of neck pain that radiates into the left upper extremity.  She feels the going into the lateral arm, dorsal forearm, and into the hand.  She notes the pain with activity and at rest.  She is also noted numbness and paresthesias in the same distribution of the pain.  No pain radiating into the right upper extremity.  There was no trauma or injury that preceded the onset of this pain.  She said when the pain is really bad she will sometimes have difficulty with using the left hand.  She has had no issues with fine motor skills in the right hand.  She has no consistent difficulty with fine motor skills and a left hand. Has found relief if she abducts her shoulder.    Weakness: Yes, sometimes her left hand feels weaker when pain is severe.  No other weakness noted Difficulty  with fine motor skills (e.g., buttoning shirts, handwriting): Sometimes she drops objects with the left hand when pain is severe.  No consistent difficulty with fine motor skills in the left hand.  No issues with the right hand. Symptoms of imbalance: Denies Paresthesias and numbness: Yes, gets numbness and paresthesias along the lateral aspect the arm, dorsal forearm, and into the hand on the left side.  No numbness or paresthesias in the right upper extremity Bowel or bladder incontinence: Denies Saddle anesthesia: Denies  Treatments tried: Tylenol , oral steroids  Review of systems: Denies fevers and chills, night sweats, unexplained weight loss, history of cancer, pain that wakes her at night  Past medical history: HLD HTN CAD Fibromyalgia Anxiety GERD Irritable bowel syndrome History of stroke Chronic pain  Allergies: NSAIDs, agumentin, sulfa, metronidazole, periactin, loratadine , doxycyline, metformin , tramadol, ranitidine methylprednisolone   Past surgical history:  Hysterectomy Bilateral bunionectomy Achilles tendon repair Sigmoidectomy Ganglion cyst excision Bilateral meniscus surgery  Social history: Denies use of nicotine product (smoking, vaping, patches, smokeless) Alcohol use: denies Denies recreational drug use   Physical Exam:  BMI of 42.6  General: no acute distress, appears stated age Neurologic: alert, answering questions appropriately, following commands Respiratory: unlabored breathing on room air, symmetric chest rise Psychiatric: appropriate affect, normal cadence to speech   MSK (spine):  -Strength exam      Left  Right Grip strength                5/5  5/5 Interosseus   5/5   5/5 Wrist extension  5/5  5/5 Wrist flexion  5/5  5/5 Elbow flexion   5/5  5/5 Deltoid    5/5  5/5   -Sensory exam    Sensation intact to light touch in C5-T1 nerve distributions of bilateral upper extremities  -Brachioradialis DTR: 2/4 on the left, 2/4 on  the right -Biceps DTR: 2/4 on the left, 2/4 on the right  -Spurling: negative bilaterally -Hoffman sign: positive bilaterally -Clonus: no beats bilaterally  -Interosseous wasting: none seen -Grip and release test: negative  -Romberg: negative -Gait: normal  Left shoulder exam: no pain through range of motion Right shoulder exam: no pain through range of motion  Imaging: XRs of the cervical spine from 07/02/2024 were independently reviewed and interpreted, showing no significant degenerative changes.  No fracture or dislocation seen.  No evidence of instability on flexion/extension views.   Patient name: Emily Osborne Patient MRN: [id number] Date of visit: 07/02/24

## 2024-07-02 NOTE — Procedures (Signed)
  [facility]  [address] [place], [address]  [place], [place] [address] Tel: [phone] Fax: [phone] Test Date:  07/02/2024  Patient: Emily Osborne DOB: 07/31/1961 Physician: Tonita Blanch, DO  Sex: Female Height: 5' 5 Ref Phys: Tonita Blanch, DO  ID#: [id number]   Technician:    History: This is a 63 year old female referred for evaluation of left arm radicular pain and paresthesias.  NCV & EMG Findings: Extensive electrodiagnostic testing of the left upper extremity shows: Left median, ulnar, and mixed palmar sensory responses are within normal limits. Left median and ulnar motor responses are within normal limits. There is no evidence of active or chronic motor axonal loss changes affecting any of the tested muscles.  Motor unit configuration and recruitment pattern is within normal limits.  Impression: This is a normal study of the left upper extremity.  In particular, there is no electrophysiologic evidence of cervical radiculopathy or carpal tunnel syndrome.   ___________________________ Tonita Blanch, DO    Nerve Conduction Studies   Stim Site NR Peak (ms) Norm Peak (ms) O-P Amp (V) Norm O-P Amp  Left Median Anti Sensory (2nd Digit)  32 C  Wrist    2.7 <3.8 24.3 >10  Left Ulnar Anti Sensory (5th Digit)  32 C  Wrist    2.6 <3.2 25.9 >5     Stim Site NR Onset (ms) Norm Onset (ms) O-P Amp (mV) Norm O-P Amp Site1 Site2 Delta-0 (ms) Dist (cm) Vel (m/s) Norm Vel (m/s)  Left Median Motor (Abd Poll Brev)  32 C  Wrist    2.9 <4.0 8.8 >5 Elbow Wrist 5.6 32.0 57 >50  Elbow    8.5  7.9         Left Ulnar Motor (Abd Dig Minimi)  32 C  Wrist    2.0 <3.1 9.5 >7 B Elbow Wrist 3.9 23.0 59 >50  B Elbow    5.9  9.1  A Elbow B Elbow 1.5 10.0 67 >50  A Elbow    7.4  9.1            Stim Site NR Peak (ms) Norm Peak (ms) P-T Amp (V) Site1 Site2 Delta-P (ms) Norm Delta (ms)  Left Median/Ulnar Palm Comparison (Wrist - 8cm)  32 C  Median Palm    1.5 <2.2 34.5 Median  Palm Ulnar Palm 0.0   Ulnar Palm    1.5 <2.2 13.4       Electromyography   Side Muscle Ins.Act Fibs Fasc Recrt Amp Dur Poly Activation Comment  Left 1stDorInt Nml Nml Nml Nml Nml Nml Nml Nml N/A  Left PronatorTeres Nml Nml Nml Nml Nml Nml Nml Nml N/A  Left Biceps Nml Nml Nml Nml Nml Nml Nml Nml N/A  Left Triceps Nml Nml Nml Nml Nml Nml Nml Nml N/A  Left Deltoid Nml Nml Nml Nml Nml Nml Nml Nml N/A  Left Cervical Parasp Low Nml Nml Nml Nml Nml Nml Nml Nml N/A      Waveforms:

## 2024-07-06 ENCOUNTER — Other Ambulatory Visit (HOSPITAL_COMMUNITY): Payer: Self-pay

## 2024-07-13 ENCOUNTER — Encounter: Payer: Self-pay | Admitting: Cardiovascular Disease

## 2024-07-13 ENCOUNTER — Ambulatory Visit: Attending: Cardiovascular Disease | Admitting: Cardiovascular Disease

## 2024-07-13 VITALS — BP 120/80 | HR 54 | Ht 65.0 in | Wt 248.0 lb

## 2024-07-13 DIAGNOSIS — I639 Cerebral infarction, unspecified: Secondary | ICD-10-CM

## 2024-07-13 DIAGNOSIS — R002 Palpitations: Secondary | ICD-10-CM | POA: Diagnosis not present

## 2024-07-13 DIAGNOSIS — Q2112 Patent foramen ovale: Secondary | ICD-10-CM

## 2024-07-13 NOTE — Patient Instructions (Incomplete)
 Medication Instructions:  *** *If you need a refill on your cardiac medications before your next appointment, please call your pharmacy*  Lab Work: *** If you have labs (blood work) drawn today and your tests are completely normal, you will receive your results only by: MyChart Message (if you have MyChart) OR A paper copy in the mail If you have any lab test that is abnormal or we need to change your treatment, we will call you to review the results.  Testing/Procedures: ***  Follow-Up: At [facility], you and your health needs are our priority.  As part of our continuing mission to provide you with exceptional heart care, our providers are all part of one team.  This team includes your primary Cardiologist (physician) and Advanced Practice Providers or APPs (Physician Assistants and Nurse Practitioners) who all work together to provide you with the care you need, when you need it.  Your next appointment:   {numbers 1-12:[id number]} {Time; day/wk/mo/yr(s):9076}  Provider:   {Providers/Teams        :[id number]} {If no MD populates, click here to update Cardiologist or EP   DO NOT delete brackets or number around this link :1}   We recommend signing up for the patient portal called MyChart.  Sign up information is provided on this After Visit Summary.  MyChart is used to connect with patients for Virtual Visits (Telemedicine).  Patients are able to view lab/test results, encounter notes, upcoming appointments, etc.  Non-urgent messages can be sent to your provider as well.   To learn more about what you can do with MyChart, go to [email or web address].   Other Instructions ***

## 2024-07-13 NOTE — Progress Notes (Signed)
  Electrophysiology Office Note:    Date:  07/13/2024   ID:  Ximenna, Fonseca [id number], MRN [id number]  PCP:  Antonio Meth, Jamee SAUNDERS, DO   [facility] Providers Cardiologist:  Shelda Bruckner, MD     Referring MD: Antonio Meth, Jamee SAUNDERS, *   History of Present Illness:    Emily Osborne is a 63 y.o. female with a hx listed below, significant for stroke thought to be of embolic origin, referred for ILR placement.  She was admitted with stroke in 06/2022 and noted to have multiple bilateral punctate infarcts. TEE did not show a significant PFO. A 14 day Zio patch did not show any atrial fibrillation. She is being referred for placement of an implantable loop recorder.    EKGs/Labs/Other Studies Reviewed Today:     TTE: Normal EF. Bubble study suggests small PFO.  EKG:  Last EKG results: today - SR   Recent Labs: 03/16/2024: TSH 0.98 05/28/2024: ALT 18; BUN 13; Creatinine, Ser 0.95; Hemoglobin 13.2; Platelets 208.0; Potassium 3.9; Sodium 143     Physical Exam:    VS:  BP 120/80 (BP Location: Right Arm, Patient Position: Sitting, Cuff Size: Large)   Pulse (!) 54   Ht 5' 5 (1.651 m)   Wt 248 lb (112.5 kg)   SpO2 96%   BMI 41.27 kg/m     Wt Readings from Last 3 Encounters:  07/13/24 248 lb (112.5 kg)  07/02/24 256 lb (116.1 kg)  06/04/24 250 lb (113.4 kg)     GEN: Well nourished, well developed in no acute distress CARDIAC: RRR, no murmurs, rubs, gallops RESPIRATORY:  Normal work of breathing MUSCULOSKELETAL: no edema    ASSESSMENT & PLAN:    Cryptogenic Stroke: involving multiple territories, 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  No atrial fibrillation detected on ILR        Medication Adjustments/Labs and Tests Ordered: Current medicines are reviewed at length with the patient today.  Concerns regarding medicines are outlined above.  Orders Placed This Encounter  Procedures   EKG 12-Lead   No orders of the defined types were placed in  this encounter.    Signed, Eulas FORBES Furbish, MD  07/13/2024 4:06 PM     [facility]

## 2024-07-17 ENCOUNTER — Other Ambulatory Visit (HOSPITAL_COMMUNITY): Payer: Self-pay

## 2024-07-17 ENCOUNTER — Encounter (HOSPITAL_COMMUNITY): Payer: Self-pay

## 2024-07-19 ENCOUNTER — Ambulatory Visit

## 2024-07-20 ENCOUNTER — Encounter

## 2024-07-22 ENCOUNTER — Ambulatory Visit: Attending: Cardiovascular Disease

## 2024-07-22 DIAGNOSIS — I639 Cerebral infarction, unspecified: Secondary | ICD-10-CM

## 2024-07-22 LAB — CUP PACEART REMOTE DEVICE CHECK
Date Time Interrogation Session: 20251202232514
Implantable Pulse Generator Implant Date: 20231219

## 2024-07-23 ENCOUNTER — Ambulatory Visit: Payer: Self-pay | Admitting: Cardiovascular Disease

## 2024-07-28 ENCOUNTER — Other Ambulatory Visit (HOSPITAL_COMMUNITY): Payer: Self-pay

## 2024-07-28 ENCOUNTER — Other Ambulatory Visit: Payer: Self-pay

## 2024-07-28 ENCOUNTER — Other Ambulatory Visit: Payer: Self-pay | Admitting: Family Medicine

## 2024-07-28 DIAGNOSIS — J302 Other seasonal allergic rhinitis: Secondary | ICD-10-CM

## 2024-07-28 MED ORDER — LEVOCETIRIZINE DIHYDROCHLORIDE 5 MG PO TABS
5.0000 mg | ORAL_TABLET | Freq: Every evening | ORAL | 1 refills | Status: AC
  Filled 2024-07-28: qty 90, 90d supply, fill #0

## 2024-07-28 NOTE — Progress Notes (Signed)
 Remote Loop Recorder Transmission

## 2024-08-10 ENCOUNTER — Other Ambulatory Visit (HOSPITAL_COMMUNITY): Payer: Self-pay

## 2024-08-10 ENCOUNTER — Other Ambulatory Visit: Payer: Self-pay

## 2024-08-10 ENCOUNTER — Ambulatory Visit: Admitting: Internal Medicine

## 2024-08-10 ENCOUNTER — Encounter: Payer: Self-pay | Admitting: Internal Medicine

## 2024-08-10 VITALS — BP 122/78 | HR 62 | Ht 65.0 in | Wt 251.0 lb

## 2024-08-10 DIAGNOSIS — Z6841 Body Mass Index (BMI) 40.0 and over, adult: Secondary | ICD-10-CM

## 2024-08-10 DIAGNOSIS — R058 Other specified cough: Secondary | ICD-10-CM | POA: Diagnosis not present

## 2024-08-10 MED ORDER — ALBUTEROL SULFATE HFA 108 (90 BASE) MCG/ACT IN AERS
2.0000 | INHALATION_SPRAY | RESPIRATORY_TRACT | 2 refills | Status: AC | PRN
  Filled 2024-08-10: qty 18, 16d supply, fill #0
  Filled 2024-08-27: qty 18, 17d supply, fill #0
  Filled 2024-08-28: qty 6.7, 16d supply, fill #0

## 2024-08-10 MED ORDER — BUDESONIDE-FORMOTEROL FUMARATE 80-4.5 MCG/ACT IN AERO
2.0000 | INHALATION_SPRAY | Freq: Two times a day (BID) | RESPIRATORY_TRACT | 11 refills | Status: AC
  Filled 2024-08-10 – 2024-08-27 (×2): qty 10.2, 30d supply, fill #0

## 2024-08-10 NOTE — Progress Notes (Unsigned)
 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 onset in teenage years   variable doe/subjective wheeze/ chronic non-cardiac cp much worse sob x 6 weeks and lives with daughter from [place] who tested positive for COVID-19 but this was not disclosed prior to her arrival here as pt says daughter's been fine since about 5/9 or 5/10 and I don't think I have it     referred to pulmonary clinic 01/01/2019 by Dr   Antonio for sob   History of Present Illness  01/01/2019  Pulmonary/ 1st office eval/Emily Osborne / maint on advair 250 dpi  Chief Complaint  Patient presents with   Pulmonary Consult    Referred by Dr Antonio- SOB since end of April. She has cough- non prod. She has had sweats but no fever.   Dyspnea: only when I walk but says  walked a mile 12/29/2018 Cough: dry daytime/ urge to clear throat is the predominant problem   saba doesn't really help rec Dexilant  60 mg    Take  30-60 min before first meal of the day and Pepcid  (famotidine )  20 mg one after supper  until return to office - this is the best way to tell whether stomach acid is contributing to your problem.   GERD diet  Plan A = Automatic = stop advair and start symbicort  160 Take 2 puffs first thing in am and then another 2 puffs about 12 hours later.  Work on inhaler technique:  Plan B = Backup Only use your albuterol  inhaler as a rescue medication For cough try tessalon  200 mg every 6 hours as needed  No work until return in 2 weeks and we do a  Covid Study prior to your visit  >>>> neg  01/13/2019    05/25/2022  acute ov/Emily Osborne re: ? Astthma flare with uri   maint on nothing x one needing   Chief Complaint  Patient presents with   Acute Visit    Follow up for upper airyway/cough varient asthma. Pt states that the generic albuterol  is not working well for her. She is currently on Symbicort  and is  working well for her. She states that she has been coughing and wheezing a lot lately.   2 weeks prior to OV  onset of cough/ wheezing started back on symbicort  80 though the numbers don't add up as has only used 10 puffs  Dyspnea:  worse with coughing spells, no longer on dexilant   Cough: min white / using lots of mints Sleeping: doing fine now  SABA use: says generic alb not working  02: none  Covid status:   vax max/ infected maybe  twice  Rec When note coughing do the following: Pepcid  20 mg after bfast and supper  When can get dexilant   Take 30-60 min before first meal of the day  GERD diet reviewed, bed blocks rec  Best cough med delsym 2tsp or mucinex dm 1200  twice daily Plan A = Automatic = Always=    Symbicort  80 Take 2 puffs first thing in am and then another 2 puffs about 12 hours later- only taper if 100% better  Work on inhaler technique: Plan B = Backup (to supplement plan A, not to replace it) Only use your albuterol  inhaler as a rescue medication     .07/06/2022  f/u ov/Emily Osborne  re: uacs vs cough variant asthma   maint on symb 80 2bid   Chief Complaint  Patient presents with   Follow-up    Doing well.  Had CVA 06/22/2022   Dyspnea:  walking 15-20 min daily s sob/ stroke affected L arm and symptoms have resolved Cough: none Sleeping: fine flat  SABA use: none  02: none  Rec No change rx    08/10/2024  f/u ov/Emily Osborne re:  uacs vs cough variant asthma    maint on symbicort  80 Take 2 puffs first thing in am and then another 2 puffs about 12 hours later.  Chief Complaint  Patient presents with   Medical Management of Chronic Issues   Cough    Breathing is overall doing well and she has had minimal cough.  Cough is non prod.   Dyspnea:  only with long walks, hills ? Better p saba  Cough: minimal / non -productive  Sleeping: flat bed one pillow  resp cc  SABA use: none lately  02: none       No obvious day to day or daytime variability or assoc excess/ purulent  sputum or mucus plugs or hemoptysis or cp or chest tightness, subjective wheeze or overt sinus or hb symptoms.    Also denies any obvious fluctuation of symptoms with weather or environmental changes or other aggravating or alleviating factors except as outlined above   No unusual exposure hx or h/o childhood pna/ asthma or knowledge of premature birth.  Current Allergies, Complete Past Medical History, Past Surgical History, Family History, and Social History were reviewed in [employer] record.  ROS  The following are not active complaints unless bolded Hoarseness, sore throat, dysphagia, dental problems, itching, sneezing,  nasal congestion or discharge of excess mucus or purulent secretions, ear ache,   fever, chills, sweats, unintended wt loss or wt gain, classically pleuritic or exertional cp,  orthopnea pnd or arm/hand swelling  or leg swelling, presyncope, palpitations, abdominal pain, anorexia, nausea, vomiting, diarrhea  or change in bowel habits or change in bladder habits, change in stools or change in urine, dysuria, hematuria,  rash, arthralgias, visual complaints, headache, numbness, weakness or ataxia or problems with walking or coordination,  change in mood or  memory.          Outpatient Medications Prior to Visit  Medication Sig Dispense Refill   acetaminophen  (TYLENOL ) 650 MG CR tablet Take 650 mg by mouth as needed for pain.     ALPRAZolam  (XANAX ) 0.25 MG tablet Take 1 tablet (0.25 mg total) by mouth 3 (three) times daily as needed. 30 tablet 2   AMBULATORY NON FORMULARY MEDICATION Medication Name: FODZYMES- use as directed     atorvastatin  (LIPITOR) 40 MG tablet Take 1 tablet (40 mg) by mouth daily. 90 tablet 1   Blood Glucose Monitoring Suppl (FREESTYLE FREEDOM) KIT 1 Device by Does not apply route daily. 1 each 0   budesonide -formoterol  (SYMBICORT ) 80-4.5 MCG/ACT inhaler Inhale 2 puffs into the lungs 2 (two) times daily, in the morning and 12 hours  later. 10.2 g 0   clopidogrel  (PLAVIX ) 75 MG tablet Take 1 tablet (75 mg total) by mouth daily. 90 tablet 3   diclofenac  sodium (VOLTAREN ) 1 % GEL Apply 2 g topically 4 (four) times daily. Rub into affected area of foot 2 to 4 times daily 100 g 2   EPINEPHrine  (EPIPEN  2-PAK) 0.3 mg/0.3 mL IJ SOAJ injection Inject 0.3 mg into the muscle as needed for anaphylaxis.  2 each 2   famotidine  (PEPCID ) 20 MG tablet Take 1 tablet (20 mg) by mouth daily after supper 30 tablet 11   fluticasone  (FLONASE ) 50 MCG/ACT nasal spray PLACE 2 SPRAYS INTO BOTH NOSTRILS DAILY. 16 g 5   furosemide  (LASIX ) 40 MG tablet Take 1 tablet (40 mg total) by mouth daily. (Patient taking differently: Take 40 mg by mouth daily as needed.) 90 tablet 1   glucose blood (FREESTYLE LITE) test strip USE TO CHECK BLOOD SUGAR ONCE A DAY 100 each 1   levocetirizine (XYZAL ) 5 MG tablet Take 1 tablet (5 mg total) by mouth every evening. 90 tablet 1   lidocaine  (LIDODERM ) 5 % Apply to the affected area once daily as needed for a week. **Remove patch after 12 hours** 30 patch 0   meclizine  (ANTIVERT ) 12.5 MG tablet Take 1 tablet (12.5 mg total) by mouth 2 (two) times daily as needed for dizziness. 30 tablet 1   methimazole  (TAPAZOLE ) 5 MG tablet Take 1 tablet (5 mg total) by mouth 3 (three) days a week (M,W,F) 45 tablet 3   metoprolol  succinate (TOPROL -XL) 25 MG 24 hr tablet Take 1 tablet (25 mg total) by mouth daily. 90 tablet 3   pantoprazole  (PROTONIX ) 40 MG tablet Take 1 tablet (40 mg total) by mouth daily. 90 tablet 3   Probiotic Product (ALIGN) 4 MG CAPS Take 1 capsule (4 mg total) by mouth daily. 90 capsule 3   sucralfate  (CARAFATE ) 1 g tablet Take 1 tablet by mouth every 6 hours as needed. 60 tablet 3   triamcinolone  ointment (KENALOG ) 0.1 % Apply as directed to affected area twice a day 454 g 3   valACYclovir  (VALTREX ) 1000 MG tablet Take 1 tablet (1,000 mg total) by mouth 3 (three) times daily as needed. 30 tablet 2   Vitamin D ,  Ergocalciferol , (DRISDOL ) 1.25 MG (50000 UNIT) CAPS capsule Take 1 capsule (50,000 Units total) by mouth every 7 (seven) days. 12 capsule 1   albuterol  (VENTOLIN  HFA) 108 (90 Base) MCG/ACT inhaler Inhale 2 puffs into the lungs every 4 (four) hours as needed for wheezing or shortness of breath. (Patient not taking: Reported on 08/10/2024) 18 g 2   promethazine -dextromethorphan (PROMETHAZINE -DM) 6.25-15 MG/5ML syrup Take 5 mLs by mouth 4 (four) times daily as needed. (Patient not taking: Reported on 08/10/2024) 118 mL 0   cholecalciferol  (VITAMIN D3) 25 MCG (1000 UNIT) tablet 2,000 units 6 days weekly.     No facility-administered medications prior to visit.                     Past Medical History:  Diagnosis Date   Allergy    Anxiety    Asthma    pt has inhaler   Back pain    C. difficile colitis    Cataract    bil eyes   Chest pain    Chronic fatigue syndrome    Clostridium difficile colitis 12/13/2016   Diverticulitis    Dyspnea    Dysrhythmia    palpitations   Edema    Elevated blood pressure reading without diagnosis of hypertension    just elevated when I'm in pain (12/07/2015)   Fatty liver    Fibromyalgia    Ganglion of joint    right wrist   GERD (gastroesophageal reflux disease)    H. pylori infection    Headache    hx of   Helicobacter pylori (H. pylori) infection 12/13/2016   Herpes zoster without mention of  complication    HTN (hypertension)    Hyperlipidemia    Hyperthyroidism    IBS (irritable bowel syndrome)    Leg edema    Multiple drug allergies 06/18/2017   Myalgia    Nontraumatic rupture of Achilles tendon    Other acute reactions to stress    Pain in joint, shoulder region    Pain in joint, upper arm    Pain in limb    Palpitations    Personal history of other diseases of digestive system    Pneumonia    once   PONV (postoperative nausea and vomiting)    Routine screening for STI (sexually transmitted infection) 12/13/2016   S/P partial  colectomy 06/18/2017   Thyroid  disease    hyperthyroidism   Type 2 diabetes mellitus with hyperglycemia, without long-term current use of insulin  (HCC) 08/07/2018   Vitamin B 12 deficiency          Objective:     Wts  08/10/2024      251  07/06/2022      230 05/25/2022        230   09/25/2019         233   01/15/19 237 lb (107.5 kg)  01/01/19 235 lb (106.6 kg)  12/23/18 233 lb 9.6 oz (106 kg)    Vital signs reviewed  08/10/2024  - Note at rest 02 sats  100% on RA   General appearance:    amb pleasant morbidly obese (by BMI) bf nad    HEENT : Oropharynx  clear      Nasal turbinates nl    NECK :  without  apparent JVD/ palpable Nodes/TM    LUNGS: no acc muscle use,  Nl contour chest which is clear to A and P bilaterally without cough on insp or exp maneuvers   CV:  RRR  no s3 or murmur or increase in P2, and no edema   ABD:  soft and nontender   MS:  Gait nl   ext warm without deformities Or obvious joint restrictions  calf tenderness, cyanosis or clubbing    SKIN: warm and dry without lesions    NEURO:  alert, approp, nl sensorium with  no motor or cerebellar deficits apparent.    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 asthma Flared early April 2020 while on Advair 250 - d/c 01/01/2019  - 01/15/2019  After extensive coaching inhaler device,  effectiveness =    50% and doing better on symb 160 1 bid so try symb 80 2bid > sustained improvement and nl pfts - 05/25/2022  After extensive coaching inhaler device,  effectiveness =    50% (short Ti) so rec resume symb 80 2bid prn and rx cough with gerd rx/ diet/ avoid mints > complete resolution 07/06/2022 > change symbicort  80 to prn   All goals of chronic asthma control met including optimal function and elimination of symptoms with minimal need for rescue therapy.  Contingencies discussed in full including contacting this office immediately if not controlling the symptoms using the rule of two's.      OK to continue symbicort  80 prn Based on two studies from [facility]  378; 20 p 1865 (2018) and 380 : p2020-30 (2019) in pts with mild asthma it is reasonable to use low dose symbicort  eg 80 2bid prn flare in this setting but I emphasized this was only shown with symbicort  and takes advantage of the rapid onset of action but is  not the same as rescue therapy but can be stopped once the acute symptoms have resolved and the need for rescue has been minimized (< 2 x weekly)     Morbid obesity due to excess calories (HCC) Body mass index is 41.77 kg/m.  -  trending up  Lab Results  Component Value Date   TSH 0.98 03/16/2024    Contributing to doe and risk of GERD/dvt/ PE  >>>   reviewed the need and the process to achieve and maintain neg calorie balance > defer f/u primary care including intermittently monitoring thyroid  status      F/u can be yearly, sooner if needed          Each maintenance medication was reviewed in detail including emphasizing most importantly the difference between maintenance and prns and under what circumstances the prns are to be triggered using an action plan format where appropriate.  Total time for H and P, chart review, counseling, reviewing hfa  device(s) and generating customized AVS unique to this office visit / same day charting = 25 min          AVS  Patient Instructions  No change in medications   Remember you can use the symbicort  80 at least 15 min before exertion   Please schedule a follow up visit in 12  months but call sooner if needed    Ozell America, MD 08/11/2024         "

## 2024-08-10 NOTE — Patient Instructions (Addendum)
 No change in medications   Remember you can use the symbicort  80 at least 15 min before exertion   Please schedule a follow up visit in 12  months but call sooner if needed

## 2024-08-11 NOTE — Assessment & Plan Note (Addendum)
 Flared early April 2020 while on Advair 250 - d/c 01/01/2019  - 01/15/2019  After extensive coaching inhaler device,  effectiveness =    50% and doing better on symb 160 1 bid so try symb 80 2bid > sustained improvement and nl pfts - 05/25/2022  After extensive coaching inhaler device,  effectiveness =    50% (short Ti) so rec resume symb 80 2bid prn and rx cough with gerd rx/ diet/ avoid mints > complete resolution 07/06/2022 > change symbicort  80 to prn   All goals of chronic asthma control met including optimal function and elimination of symptoms with minimal need for rescue therapy.  Contingencies discussed in full including contacting this office immediately if not controlling the symptoms using the rule of two's.     OK to continue symbicort  80 prn Based on two studies from [facility]  378; 20 p 1865 (2018) and 380 : p2020-30 (2019) in pts with mild asthma it is reasonable to use low dose symbicort  eg 80 2bid prn flare in this setting but I emphasized this was only shown with symbicort  and takes advantage of the rapid onset of action but is not the same as rescue therapy but can be stopped once the acute symptoms have resolved and the need for rescue has been minimized (< 2 x weekly)

## 2024-08-11 NOTE — Assessment & Plan Note (Addendum)
 Body mass index is 41.77 kg/m.  -  trending up  Lab Results  Component Value Date   TSH 0.98 03/16/2024    Contributing to doe and risk of GERD/dvt/ PE  >>>   reviewed the need and the process to achieve and maintain neg calorie balance > defer f/u primary care including intermittently monitoring thyroid  status

## 2024-08-19 ENCOUNTER — Ambulatory Visit

## 2024-08-20 ENCOUNTER — Encounter

## 2024-08-21 ENCOUNTER — Other Ambulatory Visit (HOSPITAL_COMMUNITY): Payer: Self-pay

## 2024-08-22 ENCOUNTER — Ambulatory Visit: Attending: Cardiovascular Disease

## 2024-08-22 DIAGNOSIS — I639 Cerebral infarction, unspecified: Secondary | ICD-10-CM

## 2024-08-24 ENCOUNTER — Ambulatory Visit: Admitting: Family Medicine

## 2024-08-24 LAB — CUP PACEART REMOTE DEVICE CHECK
Date Time Interrogation Session: 20260102232549
Implantable Pulse Generator Implant Date: 20231219

## 2024-08-27 ENCOUNTER — Other Ambulatory Visit (HOSPITAL_COMMUNITY): Payer: Self-pay

## 2024-08-27 NOTE — Progress Notes (Signed)
 Remote Loop Recorder Transmission

## 2024-08-28 ENCOUNTER — Other Ambulatory Visit (HOSPITAL_BASED_OUTPATIENT_CLINIC_OR_DEPARTMENT_OTHER): Payer: Self-pay

## 2024-08-28 ENCOUNTER — Other Ambulatory Visit (HOSPITAL_COMMUNITY): Payer: Self-pay

## 2024-08-29 ENCOUNTER — Ambulatory Visit: Payer: Self-pay | Admitting: Cardiovascular Disease

## 2024-08-31 ENCOUNTER — Ambulatory Visit: Admitting: Orthopedic Surgery

## 2024-09-01 ENCOUNTER — Ambulatory Visit: Admitting: Family Medicine

## 2024-09-08 ENCOUNTER — Other Ambulatory Visit: Payer: Self-pay | Admitting: Family Medicine

## 2024-09-08 ENCOUNTER — Other Ambulatory Visit (HOSPITAL_COMMUNITY): Payer: Self-pay

## 2024-09-08 ENCOUNTER — Other Ambulatory Visit: Payer: Self-pay

## 2024-09-08 MED ORDER — VITAMIN D (ERGOCALCIFEROL) 1.25 MG (50000 UNIT) PO CAPS
50000.0000 [IU] | ORAL_CAPSULE | ORAL | 1 refills | Status: AC
  Filled 2024-09-08: qty 12, 84d supply, fill #0

## 2024-09-08 MED ORDER — FREESTYLE LITE TEST VI STRP
ORAL_STRIP | 1 refills | Status: AC
  Filled 2024-09-08: qty 100, 90d supply, fill #0

## 2024-09-19 ENCOUNTER — Ambulatory Visit

## 2024-09-22 ENCOUNTER — Ambulatory Visit

## 2024-09-22 LAB — CUP PACEART REMOTE DEVICE CHECK
Date Time Interrogation Session: 20260202232051
Implantable Pulse Generator Implant Date: 20231219

## 2024-10-19 ENCOUNTER — Ambulatory Visit: Payer: No Typology Code available for payment source | Admitting: Internal Medicine

## 2024-10-23 ENCOUNTER — Ambulatory Visit

## 2024-11-02 ENCOUNTER — Ambulatory Visit: Admitting: Orthopedic Surgery

## 2024-11-09 ENCOUNTER — Ambulatory Visit: Admitting: Neurology

## 2024-11-23 ENCOUNTER — Ambulatory Visit

## 2024-12-24 ENCOUNTER — Ambulatory Visit

## 2025-01-24 ENCOUNTER — Ambulatory Visit

## 2025-02-24 ENCOUNTER — Ambulatory Visit

## 2025-03-27 ENCOUNTER — Ambulatory Visit

## 2025-04-27 ENCOUNTER — Ambulatory Visit

## 2025-05-28 ENCOUNTER — Ambulatory Visit

## 2025-06-28 ENCOUNTER — Ambulatory Visit

## 2025-07-29 ENCOUNTER — Ambulatory Visit

## 2025-08-29 ENCOUNTER — Ambulatory Visit
